# Patient Record
Sex: Female | Born: 1945 | Race: White | Hispanic: No | Marital: Married | Smoking: Never smoker
Health system: Southern US, Community
[De-identification: ages and names within clinical notes are randomized; demographics above are authoritative.]

## PROBLEM LIST (undated history)

## (undated) ENCOUNTER — Emergency Department (HOSPITAL_COMMUNITY): Payer: Medicare HMO

## (undated) DIAGNOSIS — H269 Unspecified cataract: Secondary | ICD-10-CM

## (undated) DIAGNOSIS — F32A Depression, unspecified: Secondary | ICD-10-CM

## (undated) DIAGNOSIS — IMO0001 Reserved for inherently not codable concepts without codable children: Secondary | ICD-10-CM

## (undated) DIAGNOSIS — M419 Scoliosis, unspecified: Secondary | ICD-10-CM

## (undated) DIAGNOSIS — J45909 Unspecified asthma, uncomplicated: Secondary | ICD-10-CM

## (undated) DIAGNOSIS — I509 Heart failure, unspecified: Secondary | ICD-10-CM

## (undated) DIAGNOSIS — I5032 Chronic diastolic (congestive) heart failure: Secondary | ICD-10-CM

## (undated) DIAGNOSIS — I700 Atherosclerosis of aorta: Secondary | ICD-10-CM

## (undated) DIAGNOSIS — D689 Coagulation defect, unspecified: Secondary | ICD-10-CM

## (undated) DIAGNOSIS — N189 Chronic kidney disease, unspecified: Secondary | ICD-10-CM

## (undated) DIAGNOSIS — E7800 Pure hypercholesterolemia, unspecified: Secondary | ICD-10-CM

## (undated) DIAGNOSIS — I4891 Unspecified atrial fibrillation: Secondary | ICD-10-CM

## (undated) DIAGNOSIS — I2781 Cor pulmonale (chronic): Secondary | ICD-10-CM

## (undated) DIAGNOSIS — M1990 Unspecified osteoarthritis, unspecified site: Secondary | ICD-10-CM

## (undated) DIAGNOSIS — T7840XA Allergy, unspecified, initial encounter: Secondary | ICD-10-CM

## (undated) DIAGNOSIS — I4821 Permanent atrial fibrillation: Secondary | ICD-10-CM

## (undated) DIAGNOSIS — M4800 Spinal stenosis, site unspecified: Secondary | ICD-10-CM

## (undated) DIAGNOSIS — J42 Unspecified chronic bronchitis: Secondary | ICD-10-CM

## (undated) DIAGNOSIS — G709 Myoneural disorder, unspecified: Secondary | ICD-10-CM

## (undated) DIAGNOSIS — E079 Disorder of thyroid, unspecified: Secondary | ICD-10-CM

## (undated) DIAGNOSIS — Z8719 Personal history of other diseases of the digestive system: Secondary | ICD-10-CM

## (undated) DIAGNOSIS — I4819 Other persistent atrial fibrillation: Secondary | ICD-10-CM

## (undated) DIAGNOSIS — F329 Major depressive disorder, single episode, unspecified: Secondary | ICD-10-CM

## (undated) DIAGNOSIS — I10 Essential (primary) hypertension: Secondary | ICD-10-CM

## (undated) DIAGNOSIS — E119 Type 2 diabetes mellitus without complications: Secondary | ICD-10-CM

## (undated) DIAGNOSIS — K219 Gastro-esophageal reflux disease without esophagitis: Secondary | ICD-10-CM

## (undated) DIAGNOSIS — M792 Neuralgia and neuritis, unspecified: Secondary | ICD-10-CM

## (undated) DIAGNOSIS — N184 Chronic kidney disease, stage 4 (severe): Secondary | ICD-10-CM

## (undated) DIAGNOSIS — G4730 Sleep apnea, unspecified: Secondary | ICD-10-CM

## (undated) DIAGNOSIS — I499 Cardiac arrhythmia, unspecified: Secondary | ICD-10-CM

## (undated) DIAGNOSIS — F419 Anxiety disorder, unspecified: Secondary | ICD-10-CM

## (undated) HISTORY — PX: CARPAL TUNNEL RELEASE: SHX101

## (undated) HISTORY — PX: CATARACT EXTRACTION W/ INTRAOCULAR LENS  IMPLANT, BILATERAL: SHX1307

## (undated) HISTORY — DX: Chronic kidney disease, unspecified: N18.9

## (undated) HISTORY — DX: Gastro-esophageal reflux disease without esophagitis: K21.9

## (undated) HISTORY — DX: Scoliosis, unspecified: M41.9

## (undated) HISTORY — DX: Other persistent atrial fibrillation: I48.19

## (undated) HISTORY — DX: Unspecified cataract: H26.9

## (undated) HISTORY — DX: Anxiety disorder, unspecified: F41.9

## (undated) HISTORY — DX: Morbid (severe) obesity due to excess calories: E66.01

## (undated) HISTORY — DX: Unspecified atrial fibrillation: I48.91

## (undated) HISTORY — DX: Myoneural disorder, unspecified: G70.9

## (undated) HISTORY — PX: TUBAL LIGATION: SHX77

## (undated) HISTORY — PX: FINGER FRACTURE SURGERY: SHX638

## (undated) HISTORY — PX: BLADDER SUSPENSION: SHX72

## (undated) HISTORY — PX: FRACTURE SURGERY: SHX138

## (undated) HISTORY — DX: Reserved for inherently not codable concepts without codable children: IMO0001

## (undated) HISTORY — DX: Atherosclerosis of aorta: I70.0

## (undated) HISTORY — DX: Spinal stenosis, site unspecified: M48.00

## (undated) HISTORY — DX: Heart failure, unspecified: I50.9

## (undated) HISTORY — PX: LAPAROSCOPIC CHOLECYSTECTOMY: SUR755

## (undated) HISTORY — DX: Essential (primary) hypertension: I10

## (undated) HISTORY — DX: Pure hypercholesterolemia, unspecified: E78.00

## (undated) HISTORY — DX: Coagulation defect, unspecified: D68.9

## (undated) HISTORY — DX: Disorder of thyroid, unspecified: E07.9

## (undated) HISTORY — DX: Neuralgia and neuritis, unspecified: M79.2

## (undated) HISTORY — DX: Allergy, unspecified, initial encounter: T78.40XA

## (undated) HISTORY — DX: Depression, unspecified: F32.A

---
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
## 1978-03-23 HISTORY — PX: VAGINAL HYSTERECTOMY: SUR661

## 1997-08-25 ENCOUNTER — Emergency Department (HOSPITAL_COMMUNITY): Admission: EM | Admit: 1997-08-25 | Discharge: 1997-08-25 | Payer: Self-pay

## 1997-09-11 ENCOUNTER — Encounter: Admission: RE | Admit: 1997-09-11 | Discharge: 1997-12-10 | Payer: Self-pay | Admitting: Internal Medicine

## 1999-10-24 ENCOUNTER — Encounter: Payer: Self-pay | Admitting: Internal Medicine

## 1999-10-24 ENCOUNTER — Encounter: Admission: RE | Admit: 1999-10-24 | Discharge: 1999-10-24 | Payer: Self-pay | Admitting: Internal Medicine

## 2001-01-03 ENCOUNTER — Encounter: Admission: RE | Admit: 2001-01-03 | Discharge: 2001-01-03 | Payer: Self-pay | Admitting: Internal Medicine

## 2001-01-03 ENCOUNTER — Encounter: Payer: Self-pay | Admitting: Internal Medicine

## 2001-06-27 ENCOUNTER — Encounter: Payer: Self-pay | Admitting: General Surgery

## 2001-07-04 ENCOUNTER — Encounter: Payer: Self-pay | Admitting: General Surgery

## 2001-07-04 ENCOUNTER — Encounter (INDEPENDENT_AMBULATORY_CARE_PROVIDER_SITE_OTHER): Payer: Self-pay | Admitting: Specialist

## 2001-07-04 ENCOUNTER — Observation Stay (HOSPITAL_COMMUNITY): Admission: RE | Admit: 2001-07-04 | Discharge: 2001-07-05 | Payer: Self-pay | Admitting: General Surgery

## 2001-11-02 ENCOUNTER — Encounter: Payer: Self-pay | Admitting: Internal Medicine

## 2001-11-02 ENCOUNTER — Encounter: Admission: RE | Admit: 2001-11-02 | Discharge: 2001-11-02 | Payer: Self-pay | Admitting: Internal Medicine

## 2003-02-14 ENCOUNTER — Encounter: Admission: RE | Admit: 2003-02-14 | Discharge: 2003-02-14 | Payer: Self-pay | Admitting: Internal Medicine

## 2004-03-13 ENCOUNTER — Encounter: Admission: RE | Admit: 2004-03-13 | Discharge: 2004-03-13 | Payer: Self-pay | Admitting: Internal Medicine

## 2004-06-20 ENCOUNTER — Encounter: Admission: RE | Admit: 2004-06-20 | Discharge: 2004-06-20 | Payer: Self-pay | Admitting: Internal Medicine

## 2004-09-02 ENCOUNTER — Encounter: Admission: RE | Admit: 2004-09-02 | Discharge: 2004-09-02 | Payer: Self-pay | Admitting: Occupational Medicine

## 2004-10-05 ENCOUNTER — Ambulatory Visit (HOSPITAL_COMMUNITY)
Admission: RE | Admit: 2004-10-05 | Discharge: 2004-10-05 | Payer: Self-pay | Admitting: Physical Medicine and Rehabilitation

## 2004-10-20 ENCOUNTER — Encounter: Admission: RE | Admit: 2004-10-20 | Discharge: 2004-10-20 | Payer: Self-pay | Admitting: Internal Medicine

## 2004-12-15 DIAGNOSIS — W540XXA Bitten by dog, initial encounter: Secondary | ICD-10-CM | POA: Insufficient documentation

## 2004-12-15 DIAGNOSIS — S91309A Unspecified open wound, unspecified foot, initial encounter: Secondary | ICD-10-CM | POA: Insufficient documentation

## 2005-03-31 ENCOUNTER — Encounter: Admission: RE | Admit: 2005-03-31 | Discharge: 2005-03-31 | Payer: Self-pay | Admitting: Internal Medicine

## 2005-08-27 ENCOUNTER — Ambulatory Visit: Payer: Self-pay | Admitting: Orthopedic Surgery

## 2006-06-11 ENCOUNTER — Encounter: Admission: RE | Admit: 2006-06-11 | Discharge: 2006-06-11 | Payer: Self-pay | Admitting: Internal Medicine

## 2006-06-21 ENCOUNTER — Emergency Department (HOSPITAL_COMMUNITY): Admission: EM | Admit: 2006-06-21 | Discharge: 2006-06-22 | Payer: Self-pay | Admitting: Emergency Medicine

## 2006-07-01 ENCOUNTER — Ambulatory Visit: Payer: Self-pay | Admitting: *Deleted

## 2006-12-14 ENCOUNTER — Encounter: Admission: RE | Admit: 2006-12-14 | Discharge: 2006-12-14 | Payer: Self-pay | Admitting: Internal Medicine

## 2007-07-25 ENCOUNTER — Encounter: Admission: RE | Admit: 2007-07-25 | Discharge: 2007-07-25 | Payer: Self-pay | Admitting: Internal Medicine

## 2008-06-26 ENCOUNTER — Encounter (INDEPENDENT_AMBULATORY_CARE_PROVIDER_SITE_OTHER): Payer: Self-pay | Admitting: *Deleted

## 2008-07-25 ENCOUNTER — Encounter: Admission: RE | Admit: 2008-07-25 | Discharge: 2008-07-25 | Payer: Self-pay | Admitting: Internal Medicine

## 2008-07-25 ENCOUNTER — Ambulatory Visit: Payer: Self-pay | Admitting: Gastroenterology

## 2008-08-10 ENCOUNTER — Telehealth: Payer: Self-pay | Admitting: Gastroenterology

## 2008-08-13 ENCOUNTER — Ambulatory Visit: Payer: Self-pay | Admitting: Gastroenterology

## 2008-08-14 ENCOUNTER — Telehealth: Payer: Self-pay | Admitting: Gastroenterology

## 2008-08-14 HISTORY — PX: COLONOSCOPY: SHX174

## 2008-11-29 ENCOUNTER — Encounter: Admission: RE | Admit: 2008-11-29 | Discharge: 2008-12-20 | Payer: Self-pay | Admitting: Orthopedic Surgery

## 2008-12-21 ENCOUNTER — Encounter: Admission: RE | Admit: 2008-12-21 | Discharge: 2009-03-21 | Payer: Self-pay | Admitting: Orthopedic Surgery

## 2009-04-29 ENCOUNTER — Ambulatory Visit (HOSPITAL_COMMUNITY): Admission: RE | Admit: 2009-04-29 | Discharge: 2009-04-29 | Payer: Self-pay | Admitting: Internal Medicine

## 2009-05-01 ENCOUNTER — Emergency Department (HOSPITAL_COMMUNITY): Admission: EM | Admit: 2009-05-01 | Discharge: 2009-05-01 | Payer: Self-pay | Admitting: Emergency Medicine

## 2009-08-30 ENCOUNTER — Encounter: Payer: Self-pay | Admitting: Internal Medicine

## 2009-10-21 ENCOUNTER — Encounter: Admission: RE | Admit: 2009-10-21 | Discharge: 2009-10-21 | Payer: Self-pay | Admitting: Internal Medicine

## 2009-10-30 ENCOUNTER — Ambulatory Visit: Payer: Self-pay | Admitting: Cardiovascular Disease

## 2009-11-06 ENCOUNTER — Telehealth (INDEPENDENT_AMBULATORY_CARE_PROVIDER_SITE_OTHER): Payer: Self-pay | Admitting: *Deleted

## 2009-11-07 ENCOUNTER — Encounter (HOSPITAL_COMMUNITY): Admission: RE | Admit: 2009-11-07 | Discharge: 2009-12-18 | Payer: Self-pay | Admitting: Cardiovascular Disease

## 2009-11-07 ENCOUNTER — Ambulatory Visit: Payer: Self-pay | Admitting: Internal Medicine

## 2009-11-07 ENCOUNTER — Encounter: Payer: Self-pay | Admitting: Internal Medicine

## 2009-11-07 ENCOUNTER — Ambulatory Visit: Payer: Self-pay

## 2009-11-13 ENCOUNTER — Ambulatory Visit: Payer: Self-pay | Admitting: Cardiovascular Disease

## 2009-11-15 ENCOUNTER — Inpatient Hospital Stay (HOSPITAL_BASED_OUTPATIENT_CLINIC_OR_DEPARTMENT_OTHER): Admission: RE | Admit: 2009-11-15 | Discharge: 2009-11-15 | Payer: Self-pay | Admitting: Cardiovascular Disease

## 2009-11-15 ENCOUNTER — Ambulatory Visit: Payer: Self-pay | Admitting: Cardiovascular Disease

## 2009-11-16 HISTORY — PX: CARDIAC CATHETERIZATION: SHX172

## 2009-11-19 DIAGNOSIS — R0609 Other forms of dyspnea: Secondary | ICD-10-CM | POA: Insufficient documentation

## 2009-11-19 DIAGNOSIS — I119 Hypertensive heart disease without heart failure: Secondary | ICD-10-CM | POA: Insufficient documentation

## 2009-11-19 DIAGNOSIS — R0600 Dyspnea, unspecified: Secondary | ICD-10-CM | POA: Insufficient documentation

## 2009-11-19 DIAGNOSIS — E119 Type 2 diabetes mellitus without complications: Secondary | ICD-10-CM | POA: Insufficient documentation

## 2009-11-19 DIAGNOSIS — R0989 Other specified symptoms and signs involving the circulatory and respiratory systems: Secondary | ICD-10-CM

## 2009-11-19 DIAGNOSIS — R9431 Abnormal electrocardiogram [ECG] [EKG]: Secondary | ICD-10-CM | POA: Insufficient documentation

## 2009-11-29 ENCOUNTER — Ambulatory Visit: Payer: Self-pay | Admitting: Cardiology

## 2009-12-02 ENCOUNTER — Ambulatory Visit: Payer: Self-pay | Admitting: Internal Medicine

## 2009-12-03 ENCOUNTER — Encounter: Payer: Self-pay | Admitting: Internal Medicine

## 2009-12-04 ENCOUNTER — Ambulatory Visit: Payer: Self-pay | Admitting: Cardiovascular Disease

## 2009-12-05 ENCOUNTER — Ambulatory Visit: Payer: Self-pay | Admitting: Internal Medicine

## 2009-12-06 ENCOUNTER — Ambulatory Visit: Payer: Self-pay | Admitting: Cardiovascular Disease

## 2009-12-09 ENCOUNTER — Ambulatory Visit: Payer: Self-pay | Admitting: Cardiovascular Disease

## 2009-12-09 ENCOUNTER — Ambulatory Visit (HOSPITAL_COMMUNITY): Admission: RE | Admit: 2009-12-09 | Discharge: 2009-12-09 | Payer: Self-pay | Admitting: Cardiovascular Disease

## 2010-01-13 ENCOUNTER — Ambulatory Visit: Payer: Self-pay | Admitting: Internal Medicine

## 2010-02-03 ENCOUNTER — Ambulatory Visit: Payer: Self-pay | Admitting: Cardiovascular Disease

## 2010-02-07 ENCOUNTER — Ambulatory Visit: Payer: Self-pay | Admitting: Cardiovascular Disease

## 2010-02-07 ENCOUNTER — Ambulatory Visit (HOSPITAL_COMMUNITY)
Admission: RE | Admit: 2010-02-07 | Discharge: 2010-02-07 | Payer: Self-pay | Source: Home / Self Care | Admitting: Cardiovascular Disease

## 2010-02-19 ENCOUNTER — Ambulatory Visit: Payer: Self-pay | Admitting: Cardiovascular Disease

## 2010-03-26 ENCOUNTER — Ambulatory Visit: Payer: Self-pay | Admitting: Cardiovascular Disease

## 2010-04-12 ENCOUNTER — Inpatient Hospital Stay (HOSPITAL_COMMUNITY)
Admission: EM | Admit: 2010-04-12 | Discharge: 2010-04-21 | Payer: Self-pay | Source: Home / Self Care | Attending: Internal Medicine | Admitting: Internal Medicine

## 2010-04-15 LAB — COMPREHENSIVE METABOLIC PANEL
AST: 37 U/L (ref 0–37)
CO2: 25 mEq/L (ref 19–32)
GFR calc Af Amer: >60 mL/min (ref >60)
GFR calc non Af Amer: >60 mL/min (ref >60)
Glucose, Bld: 303 mg/dL — ABNORMAL HIGH (ref 70–99)
Potassium: 3.8 mEq/L (ref 3.5–5.1)
Sodium: 131 mEq/L — ABNORMAL LOW (ref 135–145)
Total Bilirubin: 1.2 mg/dL (ref 0.3–1.2)
Total Protein: 6.5 g/dL (ref 6.0–8.3)

## 2010-04-15 LAB — INFLUENZA PANEL BY PCR (TYPE A & B)
Influenza A By PCR: NEGATIVE
Influenza B By PCR: NEGATIVE

## 2010-04-15 LAB — BASIC METABOLIC PANEL
BUN: 8 mg/dL (ref 6–23)
Calcium: 7.5 mg/dL — ABNORMAL LOW (ref 8.4–10.5)
Chloride: 99 mEq/L (ref 96–112)
Creatinine, Ser: 0.69 mg/dL (ref 0.4–1.2)
GFR calc Af Amer: >60 mL/min (ref >60)

## 2010-04-15 LAB — GLUCOSE, CAPILLARY
Glucose-Capillary: 260 mg/dL — ABNORMAL HIGH (ref 70–99)
Glucose-Capillary: 295 mg/dL — ABNORMAL HIGH (ref 70–99)
Glucose-Capillary: 332 mg/dL — ABNORMAL HIGH (ref 70–99)
Glucose-Capillary: 188 mg/dL — ABNORMAL HIGH (ref 70–99)
Glucose-Capillary: 191 mg/dL — ABNORMAL HIGH (ref 70–99)
Glucose-Capillary: 148 mg/dL — ABNORMAL HIGH (ref 70–99)
Glucose-Capillary: 232 mg/dL — ABNORMAL HIGH (ref 70–99)

## 2010-04-15 LAB — CBC
HCT: 37.9 % (ref 36.0–46.0)
HCT: 31.9 % — ABNORMAL LOW (ref 36.0–46.0)
Hemoglobin: 12.2 g/dL (ref 12.0–15.0)
Hemoglobin: 10.2 g/dL — ABNORMAL LOW (ref 12.0–15.0)
MCH: 26.2 pg (ref 26.0–34.0)
MCH: 26.4 pg (ref 26.0–34.0)
MCHC: 32.2 g/dL (ref 30.0–36.0)
MCHC: 32.4 g/dL (ref 30.0–36.0)
MCV: 81.5 fL (ref 78.0–100.0)
MCV: 81.4 fL (ref 78.0–100.0)
MCV: 82.4 fL (ref 78.0–100.0)
Platelets: 180 10*3/uL (ref 150–400)
Platelets: 162 10*3/uL (ref 150–400)
RBC: 4.09 MIL/uL (ref 3.87–5.11)
RDW: 15.1 % (ref 11.5–15.5)
RDW: 15.3 % (ref 11.5–15.5)
WBC: 6.4 10*3/uL (ref 4.0–10.5)

## 2010-04-15 LAB — DIFFERENTIAL
Eosinophils Relative: 0 % (ref 0–5)
Lymphocytes Relative: 9 % — ABNORMAL LOW (ref 12–46)

## 2010-04-15 LAB — URINALYSIS, ROUTINE W REFLEX MICROSCOPIC
Protein, ur: 30 mg/dL — AB
Specific Gravity, Urine: 1.026 (ref 1.005–1.030)
Urine Glucose, Fasting: >1000 mg/dL — AB
Urobilinogen, UA: 1 mg/dL (ref 0.0–1.0)

## 2010-04-15 LAB — URINE MICROSCOPIC-ADD ON

## 2010-04-15 LAB — PROCALCITONIN: Procalcitonin: 0.94 ng/mL

## 2010-04-16 LAB — URINALYSIS, MICROSCOPIC ONLY
Bilirubin Urine: NEGATIVE
Hgb urine dipstick: NEGATIVE
Specific Gravity, Urine: 1.028 (ref 1.005–1.030)
Urine Glucose, Fasting: NEGATIVE mg/dL
Urobilinogen, UA: 0.2 mg/dL (ref 0.0–1.0)
pH: 5 (ref 5.0–8.0)

## 2010-04-16 LAB — CBC
HCT: 34.9 % — ABNORMAL LOW (ref 36.0–46.0)
HCT: 34.3 % — ABNORMAL LOW (ref 36.0–46.0)
Hemoglobin: 10.8 g/dL — ABNORMAL LOW (ref 12.0–15.0)
Platelets: 195 10*3/uL (ref 150–400)
RBC: 4.18 MIL/uL (ref 3.87–5.11)
RDW: 15.6 % — ABNORMAL HIGH (ref 11.5–15.5)
RDW: 15.7 % — ABNORMAL HIGH (ref 11.5–15.5)
WBC: 7.3 10*3/uL (ref 4.0–10.5)
WBC: 8.1 10*3/uL (ref 4.0–10.5)

## 2010-04-16 LAB — GLUCOSE, CAPILLARY
Glucose-Capillary: 171 mg/dL — ABNORMAL HIGH (ref 70–99)
Glucose-Capillary: 144 mg/dL — ABNORMAL HIGH (ref 70–99)
Glucose-Capillary: 197 mg/dL — ABNORMAL HIGH (ref 70–99)
Glucose-Capillary: 190 mg/dL — ABNORMAL HIGH (ref 70–99)

## 2010-04-16 LAB — COMPREHENSIVE METABOLIC PANEL
ALT: 58 U/L — ABNORMAL HIGH (ref 0–35)
ALT: 61 U/L — ABNORMAL HIGH (ref 0–35)
Alkaline Phosphatase: 82 U/L (ref 39–117)
Alkaline Phosphatase: 97 U/L (ref 39–117)
BUN: 7 mg/dL (ref 6–23)
CO2: 25 mEq/L (ref 19–32)
CO2: 25 mEq/L (ref 19–32)
Chloride: 108 mEq/L (ref 96–112)
GFR calc non Af Amer: >60 mL/min (ref >60)
Glucose, Bld: 181 mg/dL — ABNORMAL HIGH (ref 70–99)
Glucose, Bld: 147 mg/dL — ABNORMAL HIGH (ref 70–99)
Potassium: 4.4 mEq/L (ref 3.5–5.1)
Potassium: 4.6 mEq/L (ref 3.5–5.1)
Sodium: 138 mEq/L (ref 135–145)
Sodium: 138 mEq/L (ref 135–145)
Total Bilirubin: 0.7 mg/dL (ref 0.3–1.2)
Total Protein: 5.5 g/dL — ABNORMAL LOW (ref 6.0–8.3)
Total Protein: 5.7 g/dL — ABNORMAL LOW (ref 6.0–8.3)

## 2010-04-16 LAB — URINE CULTURE
Colony Count: >=100000
Culture  Setup Time: 201201220020

## 2010-04-16 LAB — LACTIC ACID, PLASMA: Lactic Acid, Venous: 1.7 mmol/L (ref 0.5–2.2)

## 2010-04-17 LAB — COMPREHENSIVE METABOLIC PANEL
ALT: 64 U/L — ABNORMAL HIGH (ref 0–35)
AST: 58 U/L — ABNORMAL HIGH (ref 0–37)
CO2: 22 mEq/L (ref 19–32)
Chloride: 109 mEq/L (ref 96–112)
Creatinine, Ser: 0.59 mg/dL (ref 0.4–1.2)
GFR calc Af Amer: >60 mL/min (ref >60)
GFR calc non Af Amer: >60 mL/min (ref >60)
Glucose, Bld: 144 mg/dL — ABNORMAL HIGH (ref 70–99)
Total Bilirubin: 0.6 mg/dL (ref 0.3–1.2)

## 2010-04-17 LAB — CBC
HCT: 33.1 % — ABNORMAL LOW (ref 36.0–46.0)
Hemoglobin: 10.3 g/dL — ABNORMAL LOW (ref 12.0–15.0)
MCH: 25.7 pg — ABNORMAL LOW (ref 26.0–34.0)
RBC: 4.01 MIL/uL (ref 3.87–5.11)

## 2010-04-17 LAB — GLUCOSE, CAPILLARY: Glucose-Capillary: 247 mg/dL — ABNORMAL HIGH (ref 70–99)

## 2010-04-17 NOTE — Consult Note (Addendum)
NAMETAHJAI, BROCKHAUS              ACCOUNT NO.:  [id number]  MEDICAL RECORD NO.:  [id number]          PATIENT TYPE:  INP  LOCATION:  3736                         FACILITY:  [facility]  PHYSICIAN:  Ludwig Lean. Doreatha Lew, M.D.DATE OF BIRTH:  July 14, 1945  DATE OF CONSULTATION: DATE OF DISCHARGE:                                CONSULTATION   Thank you very much for asking me to see Mr. Buys for management of her rapid ventricular response to her atrial fibrillation.  She presented on April 12, 2010, with a 3-4-day history of increasing weakness, cough, and was found to have a right lower lobe pneumonia. She had been started on Z-Pak earlier and has been on antibiotics since that time.  She has a history of chronic atrial fibrillation, but during the treatment of her hospitalization, the patient received albuterol bronchodilators and developed a rapid ventricular response with increasing anxiety.  She remained hypertensive during this time.  She has a history of normal coronary arteries by catheterization in August 2011.  She has a history of diabetes as well as morbid obesity.  She has had a history of failed cardioversion over the past 6 months.  She has been on chronic metoprolol extended release 150 mg a day as well as chronic digoxin.  She received 60 mg of diltiazem p.o. earlier today.  Her past medical history is remarkable for: 1. Chronic mild dyspnea on exertion. 2. Chronic atrial fibrillation. 3. Exogenous obesity. 4. Dyslipidemia with lipid showed cholesterol levels of 183,     triglycerides 265, HDL cholesterol of 29, LDL cholesterol of 101 in     September 2011. 5. She has type 2 diabetes mellitus. 6. Interstitial cystitis. 7. Lichen planus. 8. Hypertension. 9. An abnormal EKG. 10.A history of atypical chest pain with a catheterization in August     2011, showing normal coronary arteries and normal ejection     fraction. 11.She has a history of osteoarthritis and  degenerative joint disease     of the spine. 12.She has vitamin D deficiency.  Her chronic home medicines include: 1. Crestor 10 mg a day. 2. Lasix 40 mg a day. 3. Ambien 10 mg nightly. 4. Toprol-XL 150 mg per day. 5. Pradaxa 150 mg p.o. b.i.d. 6. Digoxin 0.25 mg daily. 7. Possible Celexa 20 mg a day. 8. Actoplus Met twice a day. 9. Reclast annually.  ALLERGIES:  PENICILLIN has been associated with flushing and LIDOCAINE has been associated with palpitations.  Previous surgical history includes: 1. Total abdominal hysterectomy. 2. Laparoscopic cholecystectomy. 3. Bladder suspension. 4. Mallet finger operation in May 2010. 5. She has had previous colonoscopy.  SOCIAL HISTORY:  She is married and has 4 children.  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  There is no history of tobacco or alcohol abuse.  FAMILY HISTORY:  Father died at age 88 of stroke.  Mother died at age 57 of heart failure.  One of her siblings has premature cardiovascular disease.  REVIEW OF SYSTEM:  As noted in the history of present illness.  She has had some nausea, vomiting, and diarrhea leading to this illness.  PHYSICAL EXAMINATION:  GENERAL:  She is pleasant, but mildly diaphoretic. VITAL SIGNS:  Blood pressure 150/90, heart rates in the 120-150 with irregularly irregular rhythm. LUNGS:  Right base for rales. HEART:  Irregularly regular rhythm without murmur. ABDOMEN:  Obese. EXTREMITIES:  Trace edema. NEUROLOGIC:  She is intact.  OVERALL IMPRESSION: 1. Chronic atrial fibrillation now, with rapid ventricular response     secondary to bronchodilators. 2. Pneumonia. 3. Diabetes mellitus. 4. Exogenous obesity.  PLAN:  We will start her on Cardizem drip.  We will continue beta- blockers and digoxin.  I would like to change her Pradaxa to Lovenox while in the hospital and we can switch back once we reach discharge. She will be on telemetry at this point in  time.  Pertinent lab tests show urinalysis to be essentially unremarkable. CMET is remarkable for BUN of 7, creatinine of 0.6, and normal electrolytes on April 16, 2010.  CBC is remarkable for white count of 8100, hematocrit of 34, platelet count of 195,000.  Lactic acid is 1.7 which is the normal range.     Ludwig Lean. Doreatha Lew, M.D.     SNT/MEDQ  D:  04/16/2010  T:  04/17/2010  Job:  [id number]  Electronically Signed by Romeo Apple M.D. on 04/17/2010 03:39:58 PM

## 2010-04-18 LAB — BRAIN NATRIURETIC PEPTIDE: Pro B Natriuretic peptide (BNP): 429 pg/mL — ABNORMAL HIGH (ref 0.0–100.0)

## 2010-04-18 LAB — GLUCOSE, CAPILLARY: Glucose-Capillary: 136 mg/dL — ABNORMAL HIGH (ref 70–99)

## 2010-04-19 LAB — D-DIMER, QUANTITATIVE: D-Dimer, Quant: 0.68 ug/mL-FEU — ABNORMAL HIGH (ref 0.00–0.48)

## 2010-04-19 LAB — GLUCOSE, CAPILLARY
Glucose-Capillary: 146 mg/dL — ABNORMAL HIGH (ref 70–99)
Glucose-Capillary: 234 mg/dL — ABNORMAL HIGH (ref 70–99)
Glucose-Capillary: 174 mg/dL — ABNORMAL HIGH (ref 70–99)

## 2010-04-19 LAB — CULTURE, BLOOD (ROUTINE X 2)
Culture  Setup Time: 201201220008
Culture: NO GROWTH

## 2010-04-19 LAB — BASIC METABOLIC PANEL
Calcium: 8.8 mg/dL (ref 8.4–10.5)
GFR calc Af Amer: >60 mL/min (ref >60)
GFR calc non Af Amer: >60 mL/min (ref >60)
Sodium: 140 mEq/L (ref 135–145)

## 2010-04-19 LAB — HEPATIC FUNCTION PANEL
AST: 33 U/L (ref 0–37)
Albumin: 2.7 g/dL — ABNORMAL LOW (ref 3.5–5.2)
Total Protein: 6 g/dL (ref 6.0–8.3)

## 2010-04-20 LAB — CBC
Hemoglobin: 11 g/dL — ABNORMAL LOW (ref 12.0–15.0)
MCH: 26.1 pg (ref 26.0–34.0)
MCHC: 31.7 g/dL (ref 30.0–36.0)
MCV: 82.2 fL (ref 78.0–100.0)
RBC: 4.22 MIL/uL (ref 3.87–5.11)

## 2010-04-20 LAB — COMPREHENSIVE METABOLIC PANEL
BUN: 4 mg/dL — ABNORMAL LOW (ref 6–23)
CO2: 31 mEq/L (ref 19–32)
Calcium: 9.1 mg/dL (ref 8.4–10.5)
Chloride: 98 mEq/L (ref 96–112)
Creatinine, Ser: 0.63 mg/dL (ref 0.4–1.2)
GFR calc Af Amer: >60 mL/min (ref >60)
GFR calc non Af Amer: >60 mL/min (ref >60)
Total Bilirubin: 0.7 mg/dL (ref 0.3–1.2)

## 2010-04-20 LAB — DIFFERENTIAL
Basophils Relative: 1 % (ref 0–1)
Eosinophils Absolute: 0.4 10*3/uL (ref 0.0–0.7)
Lymphs Abs: 3.3 10*3/uL (ref 0.7–4.0)
Monocytes Absolute: 0.7 10*3/uL (ref 0.1–1.0)
Monocytes Relative: 7 % (ref 3–12)
Neutro Abs: 5.7 10*3/uL (ref 1.7–7.7)
Neutrophils Relative %: 56 % (ref 43–77)

## 2010-04-20 LAB — GLUCOSE, CAPILLARY
Glucose-Capillary: 202 mg/dL — ABNORMAL HIGH (ref 70–99)
Glucose-Capillary: 156 mg/dL — ABNORMAL HIGH (ref 70–99)
Glucose-Capillary: 202 mg/dL — ABNORMAL HIGH (ref 70–99)

## 2010-04-21 LAB — COMPREHENSIVE METABOLIC PANEL
ALT: 32 U/L (ref 0–35)
Albumin: 2.8 g/dL — ABNORMAL LOW (ref 3.5–5.2)
Calcium: 8.8 mg/dL (ref 8.4–10.5)
Glucose, Bld: 143 mg/dL — ABNORMAL HIGH (ref 70–99)
Sodium: 141 mEq/L (ref 135–145)
Total Protein: 5.8 g/dL — ABNORMAL LOW (ref 6.0–8.3)

## 2010-04-21 LAB — CBC
MCV: 82 fL (ref 78.0–100.0)
Platelets: 324 10*3/uL (ref 150–400)
RBC: 4.22 MIL/uL (ref 3.87–5.11)
RDW: 16.3 % — ABNORMAL HIGH (ref 11.5–15.5)
WBC: 9.1 10*3/uL (ref 4.0–10.5)

## 2010-04-21 LAB — GLUCOSE, CAPILLARY: Glucose-Capillary: 177 mg/dL — ABNORMAL HIGH (ref 70–99)

## 2010-04-21 LAB — DIFFERENTIAL
Basophils Absolute: 0.1 10*3/uL (ref 0.0–0.1)
Basophils Relative: 1 % (ref 0–1)
Eosinophils Absolute: 0.4 10*3/uL (ref 0.0–0.7)
Eosinophils Relative: 4 % (ref 0–5)
Neutrophils Relative %: 52 % (ref 43–77)

## 2010-04-22 ENCOUNTER — Ambulatory Visit: Payer: Self-pay | Admitting: Cardiovascular Disease

## 2010-04-24 NOTE — Assessment & Plan Note (Signed)
Summary: 6 WKS F/U ///KP   Primary Provider/Referring Provider:  R. Avva  CC:  6 week follow up visit-review 31mw and PFT results..  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 She was aware of dyspnea in October/ November of 2010 while walking up an incline. In June 2011 she was treated for bronchits with azithromycin, improving slowly. She notes only dyspnea, mainly with climbing hills or stairs, or rushing. Only a little episodic coughing with little sputum or wheeze. Breathing doesn't wake he. Did wheeze some while supine in June.Rockey Situ office spirometry normal in August. Cardiac work-up with Dr Acie Fredrickson showed inverted T wave, abnormal myoview, but normal coronaries and EF on Cath. No hx MI. She has been having episodic palpitations, not related to episodes of dyspnea. On October 23, 2009 she had her "worst episode" of sustained tightness and dyspnea trying to climb an incline in very hot humid weather. She was told most likely dyspnea reflected her overweight and deconditioning.  Denies prior dx of asthma. Allergy testing remotely, no vaccine. Eyes have itched over past year- told she had dry eyes.   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 She was referred last visit for dyspnea evaluation and was shown to have PAF, Since then she has done much better. Dr Acie Fredrickson added Pradaxa, Digoxin and Toprol XL. They stopped aspirin and benazepril. With heart rate control her dyspnea has been better,  She has had pneumonia x 4. Pneumovax x 2. Discussed weight loss and aerobic exercise.  CXRs were reviewed last time on film and returned to [place]. They had sugested mild interstitial fluid loading.  PFT-12/05/09- Mild restriction TLC 73%, DLCO 69%, Spirometry WNL w/ resp to BD 6MWT-12/05/09- 100%, 98%, 98% 368 m.  Preventive Screening-Counseling  & Management  Alcohol-Tobacco     Smoking Status: never     Passive Smoke Exposure: yes     Passive Smoke Counseling: to avoid passive smoke exposure  Current Medications (verified): 1)  Ambien 10 Mg Tabs (Zolpidem Tartrate) .... Take 1 By Mouth At Bedtime As Needed 2)  Furosemide 40 Mg Tabs (Furosemide) .... Take 1 By Mouth Once Daily 3)  Pradaxa 150 Mg Caps (Dabigatran Etexilate Mesylate) .... Take 1 By Mouth Once Daily 4)  Toprol Xl 50 Mg Xr24h-Tab (Metoprolol Succinate) .... Take 1 By Mouth Once Daily 5)  Digoxin 0.25 Mg Tabs (Digoxin) .... Take 1 By Mouth Once Daily 6)  Actoplus Met 15-500 Mg Tabs (Pioglitazone Hcl-Metformin Hcl) .... Take 1 By Mouth Once Daily  Allergies (verified): 1)  ! Pcn 2)  ! * Ivp Dye 3)  ! * Bee Stings  Past History:  Past Surgical History: Last updated: 12/02/2009 cholecystectomy,  hysterectomy  Family History: Last updated: 2009-12-07  Her father died at age 64 of a CVA.  Her mother died at   age 4 of congestive heart failure.      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  Never converted PPD skin test     Risk Factors: Smoking Status: never (01/13/2010) Passive Smoke Exposure: yes (01/13/2010)  Past Medical History:  1. Diabetes mellitus.   2. Hypertension.   3. Abnormal EKG.   4. Abnormal Myoview study. Nl cardiac cath  2011 Dr Acie Fredrickson  5. Dyspnea on exertion.  PFT 12/05/09- FEV1/FVC 0.81, mild resp to BD, TLC 73%, DLCO 69%, 6MWT 368 m.  6. Paroxysmal Atrial Fib 12/02/09  Review of Systems      See HPI       The patient complains of shortness of breath with activity and weight change.  The patient denies shortness of breath at rest, productive cough, non-productive cough, coughing up blood, chest pain, irregular heartbeats, acid heartburn, indigestion, loss of appetite,  abdominal pain, difficulty swallowing, sore throat, tooth/dental problems, headaches, nasal congestion/difficulty breathing through nose, sneezing, itching, ear ache, anxiety, rash, change in color of mucus, and fever.    Vital Signs:  Patient profile:   65 year old female Height:      68 inches Weight:      258.25 pounds BMI:     39.41 O2 Sat:      96 % on Room air Pulse rate:   95 / minute BP sitting:   140 / 90  (left arm) Cuff size:   large  Vitals Entered By: Clayborne Dana CMA (January 13, 2010 3:53 PM)  O2 Flow:  Room air CC: 6 week follow up visit-review 54mw and PFT results.   Physical Exam  Additional Exam:  General: A/Ox3; pleasant and cooperative, NAD, 132/72, p 113. room air sat 96%. SKIN: no rash, lesions NODES: no lymphadenopathy HEENT: [place]/AT, EOM- WNL, Conjuctivae- clear, PERRLA, TM-WNL, Nose- clear, Throat- clear and wnl, Mallampati  II NECK: Supple w/ fair ROM, JVD- none, normal carotid impulses w/o bruits Thyroid- normal to palpation CHEST: Clear to P&A, unlabored, no wheeze or cough HEART: Irregularly irregular pulse, no murmur or rub ABDOMEN: Soft and nl; nml bowel sounds; no organomegaly or masses noted, overweight [id number], nl pulses, no edema  NEURO: Grossly intact to observation      Impression & Recommendations:  Problem # 1:  DYSPNEA ON EXERTION (ICD-786.09)  Dyspnea primarily related to interstitial edema from Atrial Fib. Since then rate is controlled and she has lost wieght.  We discussed further weight loss and aerobic exercise. She still has some mild restriction and reduction of diffusion capacity with mild reactive airways disease on PFT. She did notice some response to  bronchodilator. We are going to take another look at her in a few months to see if there is significant lung disease separate from her cardiac status.   The following medications were removed from the medication list:    Benazepril Hcl 20 Mg Tabs (Benazepril hcl) .Marland Kitchen... Take  1 by mouth two times a day    Bystolic 10 Mg Tabs (Nebivolol hcl) .Marland Kitchen... Take 1 by mouth once daily Her updated medication list for this problem includes:    Furosemide 40 Mg Tabs (Furosemide) .Marland Kitchen... Take 1 by mouth once daily    Toprol Xl 50 Mg Xr24h-tab (Metoprolol succinate) .Marland Kitchen... Take 1 by mouth once daily  Medications Added to Medication List This Visit: 1)  Pradaxa 150 Mg Caps (Dabigatran etexilate mesylate) .... Take 1 by mouth once daily 2)  Toprol Xl 50 Mg Xr24h-tab (Metoprolol succinate) .... Take 1 by mouth once daily 3)  Digoxin 0.25 Mg Tabs (Digoxin) .... Take 1 by mouth once daily 4)  Actoplus Met 15-500 Mg Tabs (Pioglitazone hcl-metformin hcl) .... Take 1 by mouth once daily  Other Orders: Est. Patient Level IV [id number])  Patient Instructions: 1)  Please schedule a follow-up appointment in 4 months. 2)  Ok to use your rescue inhaler occasionally as  needed for instance before exercise if that helps.

## 2010-04-24 NOTE — Miscellaneous (Signed)
Summary: Orders Update pft charges  Clinical Lists Changes  Orders: Added new Service order of Carbon Monoxide diffusing w/capacity (94720) - Signed Added new Service order of Lung Volumes (94240) - Signed Added new Service order of Spirometry (Pre & Post) (94060) - Signed 

## 2010-04-24 NOTE — Assessment & Plan Note (Signed)
Summary: SIX MIN WALK- PULM STRESS TEST  Nurse Visit   Vital Signs:  Patient profile:   65 year old female Pulse rate:   84 / minute BP sitting:   138 / 80  Medications Prior to Update: 1)  Ambien 10 Mg Tabs (Zolpidem Tartrate) .... Take 1 By Mouth At Bedtime As Needed 2)  Furosemide 40 Mg Tabs (Furosemide) .... Take 1 By Mouth Once Daily 3)  Avandamet 06-998 Mg Tabs (Rosiglitazone-Metformin) .... Take 1 By Mouth Two Times A Day 4)  Benazepril Hcl 20 Mg Tabs (Benazepril Hcl) .... Take 1 By Mouth Two Times A Day 5)  Bystolic 10 Mg Tabs (Nebivolol Hcl) .... Take 1 By Mouth Once Daily 6)  Aspirin 325 Mg Tabs (Aspirin) .Marland Kitchen.. 1 Daily Otc  Allergies: 1)  ! Pcn 2)  ! * Ivp Dye 3)  ! * Bee Stings  Orders Added: 1)  Pulmonary Stress (6 min walk) [94620]   Six Minute Walk Test Medications taken before test(dose and time):  2)  Furosemide 40 Mg Tabs (Furosemide) .... Take 1 By Mouth Once Daily 3)  Avandamet 06-998 Mg Tabs (Rosiglitazone-Metformin) .... Take 1 By Mouth Two Times A Day 6)  Aspirin 325 Mg Tabs (Aspirin) .Marland Kitchen.. 1 Daily Otc 7) Toprol XL- 100mg  tabs a day 8) Prodaxa- 150mg  tabs- twice a day 9) Digoxin- 0.25mg - 1 tab a day Pt states that she stopped taking Bystolic as of yesterday per dr. Ernesta Amble instructions (new diagnosis AFIB). She began taking Toprol, Prodaxa and Digoxin   Supplemental oxygen during the test: No  Lap counter(place a tick mark inside a square for each lap completed) lap 1 complete  lap 2 complete   lap 3 complete   lap 4 complete  lap 5 complete  lap 6 complete  lap 7 complete    Baseline  BP sitting: 138/ 80 Heart rate: 84 Dyspnea ( Borg scale) 2 Fatigue (Borg scale) 2 SPO2 100  End Of Test  BP sitting: 144/ 82 Heart rate: 146 Dyspnea ( Borg scale) 3 Fatigue (Borg scale) 3 SPO2 98  2 Minutes post  BP sitting: 138/ 76 Heart rate: 107 SPO2 98  Stopped or paused before six minutes? No  Interpretation: Number of laps  7 X 48 meters =    336 meters+ final partial lap: 32 meters =    368 meters   Total distance walked in six minutes: 368 meters  Tech ID: Cooper Render, CNA (December 05, 2009 4:26 PM) Tech Comments Pt completed test w/ 0 rest breaks and 1 complaint: SOB which resolved at 2 mins post.   Appended Document: SIX MIN WALK- PULM STRESS TEST I GAVE NEW MED INFO TO KATIE.

## 2010-04-24 NOTE — Assessment & Plan Note (Signed)
Summary: SOB///kp   Primary Provider/Referring Provider:  R. [name]  CC:  Pulmonary consult-SOB(Dr. Doreatha [name]).  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 She was aware of dyspnea in October/ November of 2010 while walking up an incline. In June 2011 she was treated for bronchits with azithromycin, improving slowly. She notes only dyspnea, mainly with climbing hills or stairs, or rushing. Only a little episodic coughing with little sputum or wheeze. Breathing doesn't wake he. Did wheeze some while supine in June.Kristine [name] office spirometry normal in August. Cardiac work-up with Dr Kristine [name] showed inverted T wave, abnormal myoview, but normal coronaries and EF on Cath. No hx MI. She has been having episodic palpitations, not related to episodes of dyspnea. On October 23, 2009 she had her "worst episode" of sustained tightness and dyspnea trying to climb an incline in very hot humid weather. She was told most likely dyspnea reflected her overweight and deconditioning.  Denies prior dx of asthma. Allergy testing remotely, no vaccine. Eyes have itched over past year- told she had dry eyes.    Preventive Screening-Counseling & Management  Alcohol-Tobacco     Smoking Status: never     Passive Smoke Exposure: yes     Passive Smoke Counseling: to avoid passive smoke exposure  Comments: Hx passive smoke exposure from father while growing up.  Current Medications (verified): 1)  Ambien 10 Mg Tabs (Zolpidem Tartrate) .... Take 1 By Mouth At Bedtime As Needed 2)  Furosemide 40 Mg Tabs (Furosemide) .... Take 1 By Mouth Once Daily 3)  Avandamet 06-998 Mg Tabs (Rosiglitazone-Metformin) .... Take 1 By Mouth Two Times A Day 4)  Benazepril Hcl 20 Mg Tabs (Benazepril Hcl) .... Take 1 By Mouth Two Times A Day 5)  Bystolic 10 Mg Tabs (Nebivolol Hcl) .... Take 1 By Mouth Once Daily  Allergies (verified): 1)  ! Pcn 2)  ! * Ivp  Dye 3)  ! * Bee Stings  Past History:  Family History: Last updated: 2009/12/06  Her father died at age 8 of a CVA.  Her mother died at   age 41 of congestive heart failure.      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  Never converted PPD skin test     Risk Factors: Smoking Status: never (12/02/2009) Passive Smoke Exposure: yes (12/02/2009)  Past Medical History:  1. Diabetes mellitus.   2. Hypertension.   3. Abnormal EKG.   4. Abnormal Myoview study. Nl cardiac cath 2011 Dr Kristine [name]  5. Dyspnea on exertion.   6. 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  Never converted PPD skin test   Smoking Status:  never Passive Smoke Exposure:  yes  Review of Systems       The patient complains of shortness of breath with activity, irregular heartbeats, ear ache, and hand/feet swelling.  The patient denies shortness of breath at rest, productive cough, non-productive cough, coughing up blood, chest pain, acid heartburn, indigestion, loss of appetite, weight change, abdominal pain, difficulty swallowing, sore throat, tooth/dental problems, headaches, nasal congestion/difficulty breathing through nose, sneezing, itching, anxiety, depression, joint  stiffness or pain, rash, change in color of mucus, and fever.    Vital Signs:  Patient profile:   65 year old female Height:      68 inches Weight:      265.13 pounds O2 Sat:      96 % on Room air Pulse rate:   113 / minute BP sitting:   132 / 72  (left arm) Cuff size:   large  Vitals Entered By: Kristine [name] CMA (December 02, 2009 3:50 PM)  O2 Flow:   Room air CC: Pulmonary consult-SOB(Dr. Doreatha [name])   Physical Exam  Additional Exam:  General: A/Ox3; pleasant and cooperative, NAD, 132/72, p 113. room air sat 96%. SKIN: no rash, lesions NODES: no lymphadenopathy HEENT: [place]/AT, EOM- WNL, Conjuctivae- clear, PERRLA, TM-WNL, Nose- clear, Throat- clear and wnl, Mallampati  II NECK: Supple w/ fair ROM, JVD- none, normal carotid impulses w/o bruits Thyroid- normal to palpation CHEST: Clear to P&A, unlabored, no wheeze or cough HEART: Irregularly irregular pulse, no murmur or rub...........Marland KitchenEKG shows Atrial fib, VR 116/min. ABDOMEN: Soft and nl; nml bowel sounds; no organomegaly or masses noted, overweight [id number], nl pulses, no edema  NEURO: Grossly intact to observation      EKG  Procedure date:  12/02/2009  Findings:      Patient: Kristine [name] Note: All result statuses are Final unless otherwise noted.  Tests: (1) [facility] ECG Observations [id number])   Heart Rate                116 BPM   PR Interval               0 ms   QT Interval               328 ms   QTc Interval              426 ms   QRS Duration              94 ms   P Axis                    1 deg   EKG QRS axis              16 deg   T Axis                    45 deg   Interpretation            'Result Below...'       RESULT: Atrial fibrillation  Low voltage in precordial leads.   ABNORMAL  Note: An exclamation mark (!) indicates a result that was not dispersed into the flowsheet. Document Creation Date: 12/02/2009 4:32 PM _______________________________________________________________________  (1) Order result status: Fina  Impression & Recommendations:  Problem # 1:  DYSPNEA ON EXERTION (ICD-786.09)  Mostly relates to overweight and deconditioned. I would like to get a full PFT and 6MWT. Meanwhile, instead of the old albutrerol CFC inhaler she hasn't used, I will give her a sample Xopenex HFA that will be less stimulating if she  should decide to try it. Episodes of dyspnea relate to exertion, especially in heat, but have not been associated with the episodic palpitations.  Her body habitus would put her at increased future risk of DVT/VTE or OSA in the appropriate clinical situation. Her updated medication list for this problem includes:    Furosemide 40 Mg Tabs (Furosemide) .Marland Kitchen... Take 1 by mouth once daily  Benazepril Hcl 20 Mg Tabs (Benazepril hcl) .Marland Kitchen... Take 1 by mouth two times a day    Bystolic 10 Mg Tabs (Nebivolol hcl) .Marland Kitchen... Take 1 by mouth once daily  Problem # 2:  ATRIAL FIBRILLATION, PAROXYSMAL (ICD-427.31)  New diagnosis. Bystolic is slowing ventricular response some. I will have her take aspirin till she can see Dr Berneta Sages for scheduled appointment in 3 days. She is not in heart failure, denies chest pain/ angina. I don't think her dyspnea is from angina-equivalent dyspnea. Her updated medication list for this problem includes:    Bystolic 10 Mg Tabs (Nebivolol hcl) .Marland Kitchen... Take 1 by mouth once daily    Aspirin 325 Mg Tabs (Aspirin) .Marland Kitchen... 1 daily otc  Medications Added to Medication List This Visit: 1)  Ambien 10 Mg Tabs (Zolpidem tartrate) .... Take 1 by mouth at bedtime as needed 2)  Furosemide 40 Mg Tabs (Furosemide) .... Take 1 by mouth once daily 3)  Avandamet 06-998 Mg Tabs (Rosiglitazone-metformin) .... Take 1 by mouth two times a day 4)  Benazepril Hcl 20 Mg Tabs (Benazepril hcl) .... Take 1 by mouth two times a day 5)  Bystolic 10 Mg Tabs (Nebivolol hcl) .... Take 1 by mouth once daily 6)  Aspirin 325 Mg Tabs (Aspirin) .Marland Kitchen.. 1 daily otc  Other Orders: Consultation Level IV [id number]) Pulmonary Referral (Pulmonary) Admin 1st Vaccine [id number]) Flu Vaccine 75yrs + [id number])  Patient Instructions: 1)  Please schedule a follow-up appointment in 6 weeks 2)  See [facility] to schedule PFT and 6 minute walk test 3)  Copy of EKG for you to cary to Dr Dagmar Hait on Thursday 4)  Take one standard adult aspirin tablet,  325 mg daily 5)  Flu vax 6)  sample Xopenex HFA rescue inhaler: 2 puffs up to 4 times daily if needed for chest tightness or wheeze. Flu Vaccine Consent Questions     Do you have a history of severe allergic reactions to this vaccine? no    Any prior history of allergic reactions to egg and/or gelatin? no    Do you have a sensitivity to the preservative Thimersol? no    Do you have a past history of Guillan-Barre Syndrome? no    Do you currently have an acute febrile illness? no    Have you ever had a severe reaction to latex? no    Vaccine information given and explained to patient? yes    Are you currently pregnant? no    Lot Number:[id number]   Exp Date:09/20/2010   Site Given  Left Deltoid IM     .lbflu Kristine [name] CMA  December 02, 2009 5:41 PM   EKG  Procedure date:  12/02/2009  Findings:      Patient: Kristine [name] Note: All result statuses are Final unless otherwise noted.  Tests: (1) [facility] ECG Observations [id number])   Heart Rate                116 BPM   PR Interval               0 ms   QT Interval               328 ms   QTc Interval              426 ms   QRS Duration              94 ms   P Axis  1 deg   EKG QRS axis              16 deg   T Axis                    45 deg   Interpretation            'Result Below...'       RESULT: Atrial fibrillation  Low voltage in precordial leads.   ABNORMAL  Note: An exclamation mark (!) indicates a result that was not dispersed into the flowsheet. Document Creation Date: 12/02/2009 4:32 PM _______________________________________________________________________  (1) Order result status: Roney Mans

## 2010-04-24 NOTE — Progress Notes (Signed)
Summary: Nuclear pre procedure  Phone Note Outgoing Call Call back at [facility] Phone [phone]   Call placed by: Valetta Fuller, [place],  November 06, 2009 4:29 PM Call placed to: Patient Summary of Call: Reviewed information on Myoview Information Sheet (see scanned document for further details).  Kristine [name] spoke with patient.      Nuclear Med Background Indications for Stress Test: Evaluation for Ischemia, Abnormal EKG   History: Echo  History Comments: 05/04/09 Echo:normal LVF  Symptoms: DOE    Nuclear Pre-Procedure Cardiac Risk Factors: Hypertension, NIDDM, Obesity

## 2010-04-24 NOTE — Assessment & Plan Note (Signed)
Summary: Cardiology Nuclear Testing  Nuclear Med Background Indications for Stress Test: Evaluation for Ischemia, Abnormal EKG   History: Echo  History Comments:  ~10ys ago Stress Echo:OK per patient; 05/04/09 Echo:normal EF  Symptoms: Chest Tightness with Exertion, Dizziness, DOE, Fatigue, Nausea, Palpitations, Rapid HR  Symptoms Comments: Chest tightness>neck, jaw and shoulders. Last episode of [id number]:[id number] week.   Nuclear Pre-Procedure Cardiac Risk Factors: Hypertension, NIDDM, Obesity Caffeine/Decaff Intake: None NPO After: 4:30 AM Lungs: Clear.  O2 Sat 97% on RA. IV 0.9% NS with Angio Cath: 22g     IV Site: Rt Hand IV Started by: Crissie Figures RN Chest Size (in) 44     Cup Size D     Height (in): 68 Weight (lb): 255 BMI: 38.91 Tech Comments: Bystolic held x 23 hours.  Nuclear Med Study 1 or 2 day study:  1 day     Stress Test Type:  treadmill/Lexiscan Reading MD:  Glori Bickers, MD     Referring MD:  Mertie Moores, MD Resting Radionuclide:  Technetium 60m Tetrofosmin     Resting Radionuclide Dose:  10.5 mCi  Stress Radionuclide:  Technetium 4m Tetrofosmin     Stress Radionuclide Dose:  33.0 mCi   Stress Protocol Exercise Time (min):  1:00 min     Max HR:  106 bpm     Predicted Max HR:  0000000 bpm  Max Systolic BP: A999333 mm Hg     Percent Max HR:  67.09 %Rate Pressure Product:  22260  Lexiscan: 0.4 mg   Stress Test Technologist:  Valetta Fuller CMA-N     Nuclear Technologist:  Charlton Amor CNMT  Rest Procedure  Myocardial perfusion imaging was performed at rest 45 minutes following the intravenous administration of Myoview Technetium 70m Tetrofosmin.  Stress Procedure  The patient received IV Lexiscan 0.4 mg over 15-seconds with concurrent low level exercise and then myoview was injected at 30-seconds while the patient continued walking one more minute.  There were no significant changes with infusion.  She did have a hypertensive response, 210/86.  Quantitative spect  images were obtained after a 45 minute delay.  QPS Raw Data Images:  There is a breast shadow on the stress images. Stress Images:  Decreased uptake in the anterior wall.  Rest Images:  Normal homogeneous uptake in all areas of the myocardium. Subtraction (SDS):  There is a reversible anterior defect which I suspect is shifting breast attenuation but cannot exclude anterior ischemia. Transient Ischemic Dilatation:  0.94  (Normal <1.22)  Lung/Heart Ratio:  0.39  (Normal <0.45)  Quantitative Gated Spect Images QGS EDV:  107 ml QGS ESV:  32 ml QGS EF:  70 % QGS cine images:  Normal   Overall Impression  Exercise Capacity: [place] study with no exercise. ECG Impression: Baseline: NSR; No significant ST segment change with Lexiscan. Overall Impression: Abnormal stress nuclear study. Overall Impression Comments: There is a reversible anterior defect which I suspect is shifting breast attenuation but cannot exclude anterior ischemia.

## 2010-04-30 NOTE — H&P (Signed)
Kristine [name], HANEK              ACCOUNT NO.:  [id number]  MEDICAL RECORD NO.:  [id number]          PATIENT TYPE:  INP  LOCATION:  5128                         FACILITY:  [facility]  PHYSICIAN:  Ermalene Searing. Philip Aspen, M.D.DATE OF BIRTH:  Mar 10, 1946  DATE OF ADMISSION:  04/12/2010 DATE OF DISCHARGE:                             HISTORY & PHYSICAL   CHIEF COMPLAINT:  Fever and malaise.  HISTORY OF PRESENT ILLNESS:  The patient is a 65 year old white woman with a 3- to 4-day history of increasing weakness, cough, and left eye irritation.  Yesterday, she had fever and was seen by the local family practice physician, Dr. Redge Gainer, and started on a Z-Pak and tobramycin, dexamethasone eyedrops for her left eye irritation.  Despite this she felt weaker today and continued to have fever with decreased appetite, nausea, and green-colored emesis.  She also has diarrhea and rhinitis with a mildly productive cough and mild shortness of breath. She called 911 and was transported to the emergency room for evaluation. Currently, she continues to have mild nausea and had some vomiting in the emergency room of green-colored emesis.  She has not had recent antibiotics and she did have the 2011-2012 influenza vaccine this past year.  PAST MEDICAL HISTORY:  Chronic mild dyspnea on exertion, chronic atrial fibrillation, exogenous obesity, dyslipidemia with September 2011 total cholesterol 183, triglycerides 265, HDL cholesterol 29, LDL cholesterol 101, diabetes mellitus type 2, interstitial cystitis, lichen planus, hypertension, abnormal EKG, atypical chest pain leading to hospital admission and August 2011 cardiac catheterization that showed normal, smooth coronary arteries with a left ventricular ejection fraction of 55- 60%.  She also has mild aortic sclerosis.  She has osteoarthritis and degenerative disk disease in the lower lumbar spine.  She also has osteoporosis and vitamin D deficiency.  She  has mild depression as well.  MEDICATIONS:  Prior to admission: 1. Crestor 10 mg p.o. daily. 2. Lasix 40 mg p.o. daily. 3. Ambien 10 mg p.o. nightly. 4. Toprol-XL 150 mg p.o. daily. 5. Pradaxa 150 mg p.o. b.i.d. 6. Digoxin 0.25 mg p.o. daily. 7. Questionable Celexa 20 mg daily. 8. Actoplus Met twice daily. 9. She gets Reclast annually.  ALLERGIES:  PENICILLIN has been associated with flushing and LIDOCAINE has been associated with palpitations.  There was a thought that she has an IV contrast dye allergy, however this past year she had a cardiac catheterization, had no reaction whatsoever to contrast.  PAST SURGICAL HISTORY:  Total abdominal hysterectomy, laparoscopic cholecystectomy, bladder suspension procedure, mallet finger operation and May 2010 colonoscopy that showed moderate diverticulosis and no polyps.  PHYSICIANS INVOLVED IN CARE:  Berneta Sages, MD; Thayer Headings, MD; Kasandra Knudsen. Annamaria Boots, MD, FCCP, FACP; Lucina Mellow. Terance Hart, MD; Loralee Pacas. Sharlett Iles, MD, Quentin Ore, FAGA; Susa Day, MD  SOCIAL HISTORY:  She is married and has 4 children.  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  She has no history of tobacco or alcohol abuse.  FAMILY HISTORY:  Father died at age 22 of stroke.  Mother died at age 33 of congestive heart failure.  One of her siblings has  premature cardiovascular disease.  There is no family history of breast cancer or colon cancer.  REVIEW OF SYSTEMS:  She had the Pneumovax vaccination in 2008.  Lately she has had a mild bifrontal headache with mild rhinitis and myalgias. She has irritation in the left eye and chronic, somewhat blurred vision in the left eye felt due to cataracts.  Over the past few days, she has had some nausea, vomiting, and diarrhea.  She has not had bloody stools and has not had recent antibiotics other than the Zithromax started yesterday.  INITIAL PHYSICAL EXAMINATION:  VITAL SIGNS:  Blood pressure 97/48,  pulse 84, respirations 22, temperature 99.4, pulse oxygen saturation was 95% on room air. GENERAL:  She is an overweight white woman who is in no apparent distress while lying supine. HEAD, EYES, EARS, NOSE AND THROAT:  Significant for dry eyes and left- sided scleritis with a minimal crusty exudate. NECK:  Supple without jugular venous distention or carotid bruit. CHEST:  Clear to auscultation. HEART:  Irregularly irregular rhythm without significant murmur. ABDOMEN:  Had normal bowel sounds and no hepatosplenomegaly or tenderness. EXTREMITIES:  Without cyanosis, clubbing, or edema and pedal pulses were normal. NEUROLOGIC:  She is alert and well oriented with normal affect.  She showed no focal neurologic deficits.  INITIAL LABORATORY STUDIES:  Serum sodium 131, potassium 3.8, chloride 94, carbon dioxide 25, BUN 8, creatinine 0.81, glucose 303, total protein 6.5, albumin 3.3, white blood cell count was 12.5 with 85% neutrophils, hemoglobin 12, hematocrit 37.9, platelets 180, serum lactate was 2.6 (0.5-2.2).  Urinalysis was nitrite positive with many bacteria.  Procalcitonin level was 0.94.  Chest x-ray showed right base airspace disease with peribronchial thickening.  EKG telemetry shows atrial fibrillation.  November 2011 labs included BUN 10, creatinine 0.6, white blood cell count 10.2, hemoglobin 12, hematocrit 40, platelets 284.  IMPRESSION AND PLAN: 1. Febrile illness:  This is most likely from early right lower lobe     pneumonia.  It is less likely that her fever and diffuse symptoms     are from urinary tract infection or viral infection.  It is     important to note if she does have acute influenza for     epidemiological purposes.  She is fairly early on in her course of     fever and might do well with addition of Tamiflu to her regimen.     We will continue Rocephin and Zithromax for now.  We will check     results of blood and urine cultures already sent and we will  check     a rapid flu test.  We will continue moderate dose IV fluids as she     appears to be somewhat dehydrated. 2. Diabetes mellitus, type 2:  Initial CBGs in a febrile state are     somewhat high.  We will initiate sliding scale insulin treatment     a.c. t.i.d. and nightly p.r.n.. 3. Left-sided scleritis.  This is most likely secondary to a viral     infection.  Bacterial conjunctivitis seems less     likely given the paucity of exudate.  We will treat her with     Ciloxan ointment  appointment at 2 drops t.i.d. 4. Atrial fibrillation:  She has chronic atrial fibrillation and     currently has a somewhat elevated ventricular rate because of her     fever.  We will use Tylenol to lower her fever and if necessary low-  dose Cardizem.          ______________________________ Ermalene Searing Philip Aspen, M.D.     DGP/MEDQ  D:  04/12/2010  T:  04/13/2010  Job:  [id number]  cc:   Berneta Sages, M.D.  Electronically Signed by Leanna Battles M.D. on 04/30/2010 07:56:34 AM

## 2010-05-07 NOTE — Discharge Summary (Signed)
Kristine [name], Kristine [name]              ACCOUNT NO.:  [id number]  MEDICAL RECORD NO.:  [id number]          PATIENT TYPE:  INP  LOCATION:  3736                         FACILITY:  [facility]  PHYSICIAN:  Berneta Sages, M.D.        DATE OF BIRTH:  11-04-1945  DATE OF ADMISSION:  04/12/2010 DATE OF DISCHARGE:  04/21/2010                        DISCHARGE SUMMARY - REFERRING   DISCHARGE DIAGNOSES: 1. Right-sided pneumonia. 2. Atrial fibrillation with rapid ventricular response. 3. Anticoagulation with Pradaxa complicated by D-dimer in the setting     of elevated right-sided pulmonary artery pressures. 4. Diastolic dysfunction with resulting congestive heart failure. 5. Increased pulmonary artery pressures secondary to the diastolic     dysfunction corrected with diuresis. 6. Type 2 diabetes, uncontrolled, now insulin dependent. 7. Hypertension. 8. Anxiety. 9. Sweats, presumably secondary to the administration of Rocephin.  SECONDARY DIAGNOSES: 1. Exogenous obesity. 2. Dyslipidemia. 3. History of interstitial cystitis. 4. Lichen planus. 5. History of atypical chest pain in August 2011, with cardiac     catheterization showing normal smooth coronary arteries with a left     ventricular ejection fraction of 55-60%. 6. Mild aortic sclerosis. 7. Osteoarthritis. 8. Degenerative disk disease in the lumbar spine. 9. Osteoporosis. 10.Vitamin D deficiency. 11.Mild depression and anxiety.  PAST SURGICAL HISTORY: 1. Total abdominal hysterectomy. 2. Laparoscopic cholecystectomy. 3. Bladder suspension procedure. 4. Mallet finger operation. 5. Colonoscopy in May 2010, showing moderate diverticulosis, no     polyps.  DISCHARGE MEDICATIONS: 1. Alprazolam 0.25 mg 1 p.o. b.i.d. p.r.n. 2. Citalopram 20 mg p.o. daily with meals. 3. Diltiazem 240 mg extended release 1 p.o. daily. 4. Guaifenesin 600 mg p.o. b.i.d. 5. Lantus insulin 15 units subcu daily. 6. Metformin 500 mg 1 p.o. b.i.d. with food. 7.  Metoprolol succinate 150 mg 1 p.o. daily. 8. Avelox 40 mg p.o. daily x5 days. 9. Crestor 10 mg each day. 10.Ambien 10 mg p.o. nightly p.r.n. 11.Tylenol 500 mg daily as needed. 12.Digoxin 0.25 mg p.o. daily. 13.Furosemide 40 mg p.o. daily. 14.Pradaxa 150 mg p.o. b.i.d. 15.Vitamin D2, 1.25 mg 1 capsule weekly on Mondays.  DISCHARGE LABORATORY DATA:  Last chest x-ray on April 18, 2010, revealed a continued right-sided pneumonia, cardiomegaly, and mild edema.  Echocardiogram on April 20, 2010, revealed EF 60-65%, left and right atrium mildly dilated, pulmonary artery pressure was 53 with comparison to previous echo showing decreased RV systolic pressures after significant diuresis.  D-dimer at the time of volume overload was 0.68.  BNP 354.  Blood cultures on April 12, 2010, were negative x2. Urine culture positive, but contaminated per the patient report being asymptomatic.  White blood cell count 9.1, hemoglobin 11.1, hematocrit 34.6%, platelet count 324.  Sodium 141, potassium 3.7, BUN 7, creatinine 0.58, glucose 143.  AST 19, ALT 32, alkaline phosphatase 71, total bilirubin 0.8.  EKG reveals atrial fibrillation.  HISTORY OF PRESENT ILLNESS:  Please see history and physical dictated by Dr. Leanna Battles on April 12, 2010, for extensive details. However, this is a 65 year old Caucasian female with the above-mentioned history, who presented with a 3-4-day history of increasing weakness, cough, and left eye irritation, seen by a  local physician, Dr. Redge Gainer and started on a Z-Pak, tobramycin, and dexamethasone eye drops for her left eye irritation.  Despite this, felt weaker with fevers that were in excess of 102 degrees with decreased appetite associated with nausea and vomiting of green-colored emesis.  She also had diarrhea and had mild shortness of breath.  She called 911 and presented to the emergency room for further evaluation and management where she had continued  nausea and vomiting and was thought secondary to right lower lobe pneumonia complicated by possible viral infection and/or question of a urinary tract infection.  She was initiated on Rocephin and azithromycin.  Blood cultures and urine cultures were sent.  She was also thought to have a left-sided scleritis, likely secondary to viral infection.  However, she was also initiated on Ciloxan ointment.  She was in chronic AFib with a somewhat elevated ventricular rate because of her fever and was initiated and continued on her beta-blocker dose.  HOSPITAL COURSE: 1. With respect to the patient's pneumonia, she was initiated on     Rocephin and azithromycin and continued to have significant sweats     after the administration of these 2 antibiotics during the first 4-     5 days of her hospitalization.  She does have a history of a     PENICILLIN allergy and these medications were discontinued and     changed over to Avelox, after which, she had no further sweats.     Her chest x-ray did mildly improve and clinically, she improved     with no further fevers and improvement in her appetite and     resolution of some abnormal liver function tests and clinically had     minimal cough at the time of discharge with normal oxygen     saturation and a normal white blood cell count.  The patient did develop and was in chronic atrial fibrillation with a rapid ventricular response in the setting of her fever, cough, and some mild volume overload as well as possible anxiety.  Her beta-blocker was continued and she was initiated on a calcium channel blocker drip which was discontinued after resolution of her rapid ventricular response. However, this recurred during her hospitalization and she was initiated on p.o. at first in the short-acting version and then the long-acting version with control of her heart rate.  Prior to discharge, echocardiograms were obtained which did reveal normal left  ventricular ejection fraction, but significantly increased pulmonary artery pressures.  D-dimer was noted to be elevated, however, this was while the patient was on Pradaxa and then subsequently subcu Lovenox at therapeutic levels.  She was diuresed a significant amount of fluid, at which time, her pulmonary artery pressures did decrease a significant amount as evaluated by Cardiology as well as her echocardiograms.  It was thought by Cardiology that she did not need CT angiogram to rule out pulmonary embolism and that her elevated pulmonary artery pressures and D-dimer were related to her volume overload in the setting of diastolic dysfunction.  She was diuresed 24-48 hours prior to discharge and felt much better where she was saturating well on room air and at discharge, had mild rales at the right base, but otherwise had no evidence of volume overload by physical exam except for some trace edema.  The patient does have uncontrolled type 2 diabetes in the setting of morbid obesity and longstanding type 2 diabetes.  She was initiated on Lantus insulin during this hospitalization.  Her  Actos was discontinued given volume shifts and the problems with TZDs thereof.  She will also be discharged on metformin with close followup and evaluation. 1. Hypertension slightly increased and we will recheck on an     outpatient basis after continued diuresis with Lasix, calcium     channel blocker, and beta-blocker.  Please note that she may not     have been taking her Lasix on a regular basis prior to admission. 2. Anxiety complicated by depression.  The patient will use citalopram     and this was initiated during this hospitalization after extensive     discussion.  Alprazolam will be used on a p.r.n. basis.  Side     effects and potential pros and cons of an SSRI were discussed with     the patient. 3. Sweats, presumably secondary to Rocephin given her PENICILLIN     allergy, but these have  resolved.  DISPOSITION:  The patient was deemed appropriate for discharge on April 21, 2010, with close outpatient followup scheduled with Dr. Acie Fredrickson within the next 24 hours and with myself in the next 7-10 days.     Berneta Sages, M.D.     RA/MEDQ  D:  04/21/2010  T:  04/21/2010  Job:  [id number]  cc:   Thayer Headings, M.D.  Electronically Signed by Berneta Sages M.D. on 05/07/2010 09:57:50 PM

## 2010-05-12 ENCOUNTER — Ambulatory Visit (INDEPENDENT_AMBULATORY_CARE_PROVIDER_SITE_OTHER): Payer: 59 | Admitting: Internal Medicine

## 2010-05-12 ENCOUNTER — Ambulatory Visit (INDEPENDENT_AMBULATORY_CARE_PROVIDER_SITE_OTHER)
Admission: RE | Admit: 2010-05-12 | Discharge: 2010-05-12 | Disposition: A | Discharge status: Home / Self Care | Payer: 59 | Source: Ambulatory Visit | Attending: Internal Medicine | Admitting: Internal Medicine

## 2010-05-12 ENCOUNTER — Other Ambulatory Visit: Payer: Self-pay | Admitting: Internal Medicine

## 2010-05-12 ENCOUNTER — Encounter: Payer: Self-pay | Admitting: Internal Medicine

## 2010-05-12 DIAGNOSIS — R0989 Other specified symptoms and signs involving the circulatory and respiratory systems: Secondary | ICD-10-CM

## 2010-05-12 DIAGNOSIS — I4891 Unspecified atrial fibrillation: Secondary | ICD-10-CM

## 2010-05-12 DIAGNOSIS — R042 Hemoptysis: Secondary | ICD-10-CM

## 2010-05-12 DIAGNOSIS — R0609 Other forms of dyspnea: Secondary | ICD-10-CM

## 2010-05-20 NOTE — Assessment & Plan Note (Signed)
Summary: 4 month return/mhh   Primary Provider/Referring Provider:  R. Avva  CC:  4 month follow up visit-dyspnea..  History of Present Illness:  January 13, 2010- DOE, AFib Nurse-CC: 6 week follow up visit-review 55mw and PFT results. She was referred last visit for dyspnea evaluation and was shown to have PAF, Since then she has done much better. Dr Acie Fredrickson added Pradaxa, Digoxin and Toprol XL. They stopped aspirin and benazepril. With heart rate control her dyspnea has been better,  She has had pneumonia x 4. Pneumovax x 2. Discussed weight loss and aerobic exercise.  CXRs were reviewed last time on film and returned to [place]. They had sugested mild interstitial fluid loading.  PFT-12/05/09- Mild restriction TLC 73%, DLCO 69%, Spirometry WNL w/ resp to BD 6MWT-12/05/09- 100%, 98%, 98% 368 m.  May 12, 2010- DOE, AFib Nurse-CC: 4 month follow up visit-dyspnea. Hosp January 17 by Dr Dagmar Hait with fever and pneumonia. Now backto work, still a little tired. Finished Avelox. Albuterol triggered her atrial fib. Otherwise her breathing has remained better since CHF found initially. Has had pneumonia x 5 in last 10 years. Had pneumovax x 2.  On Pradaxa she coughed a little bloody mucus today. Denies dyspnea except heavier exertion like pulling trash can. Not coughing much. She would like me to get a f/u CXR today while she is here.  She is making an effort to lose weight with careful diet now, and we discussed how that can help her dyspnea as well.       Preventive Screening-Counseling & Management  Alcohol-Tobacco     Smoking Status: never     Passive Smoke Exposure: yes     Passive Smoke Counseling: to avoid passive smoke exposure  Current Medications (verified): 1)  Ambien 10 Mg Tabs (Zolpidem Tartrate) .... Take 1 By Mouth At Bedtime As Needed 2)  Furosemide 40 Mg Tabs (Furosemide) .... Take 1 By Mouth Once Daily 3)  Pradaxa 150 Mg Caps (Dabigatran Etexilate Mesylate) .... Take 1 By  Mouth Once Daily 4)  Toprol Xl 50 Mg Xr24h-Tab (Metoprolol Succinate) .... Take 1 By Mouth Once Daily 5)  Digoxin 0.25 Mg Tabs (Digoxin) .... Take 1 By Mouth Once Daily 6)  Metformin Hcl 500 Mg Tabs (Metformin Hcl) .... Take 1 By Mouth Two Times A Day 7)  Lantus 100 Unit/ml Soln (Insulin Glargine) .Marland Kitchen.. 18 Units Every Night 8)  Cardizem La 240 Mg Xr24h-Tab (Diltiazem Hcl Coated Beads) .... Take 1 By Mouth Once Daily  Allergies: 1)  ! Pcn 2)  ! * Ivp Dye 3)  ! * Albuterol 4)  ! * Bee Stings  Past History:  Past Medical History: Last updated: 01/13/2010  1. Diabetes mellitus.   2. Hypertension.   3. Abnormal EKG.   4. Abnormal Myoview study. Nl cardiac cath 2011 Dr Acie Fredrickson  5. Dyspnea on exertion.  PFT 12/05/09- FEV1/FVC 0.81, mild resp to BD, TLC 73%, DLCO 69%, 6MWT 368 m.  6. Paroxysmal Atrial Fib 12/02/09  Past Surgical History: Last updated: 12/02/2009 cholecystectomy,  hysterectomy  Family History: Last updated: 11-24-09  Her father died at age 54 of a CVA.  Her mother died at   age 10 of congestive heart failure.      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  Never converted PPD skin test     Risk  Factors: Smoking Status: never (05/12/2010) Passive Smoke Exposure: yes (05/12/2010)  Review of Systems      See HPI       The patient complains of shortness of breath with activity, coughing up blood, and irregular heartbeats.  The patient denies shortness of breath at rest, productive cough, non-productive cough, chest pain, indigestion, loss of appetite, weight change, abdominal pain, difficulty swallowing, sore throat, tooth/dental problems, headaches, nasal congestion/difficulty breathing through nose, and sneezing.    Vital Signs:  Patient profile:   65 year old female Height:      68 inches Weight:       234.50 pounds BMI:     35.78 O2 Sat:      96 % on Room air Pulse rate:   93 / minute BP sitting:   156 / 84  (left arm) Cuff size:   large  Vitals Entered By: Clayborne Dana CMA (May 12, 2010 9:31 AM)  O2 Flow:  Room air CC: 4 month follow up visit-dyspnea.   Physical Exam  Additional Exam:  General: A/Ox3; pleasant and cooperative, NAD, 132/72, p 113. room air sat 96%.  SKIN: no rash, lesions NODES: no lymphadenopathy HEENT: [place]/AT, EOM- WNL, Conjuctivae- clear, PERRLA, TM-WNL, Nose- clear, Throat- clear and wnl, Mallampati  II NECK: Supple w/ fair ROM, JVD- none, normal carotid impulses w/o bruits Thyroid- normal to palpation CHEST: Clear to P&A, unlabored, no wheeze or cough HEART: Regular with frequent extra beats and whiff of systolic left parasternal murmur.  ABDOMEN: Soft and nl; nml bowel sounds; no organomegaly or masses noted, overweight [id number], nl pulses, no edema under elastic hose.  NEURO: Grossly intact to observation      Impression & Recommendations:  Problem # 1:  DYSPNEA ON EXERTION (ICD-786.09) Assessment Unchanged  Cardiogenic dyspnea. Now with a little blood in sputum on Pradaxa after pneumonia. I will do CXR as she requests, while here today. I have suggested that she see me again as needed and maintain management with her internist and cardiologist.  Her updated medication list for this problem includes:    Furosemide 40 Mg Tabs (Furosemide) .Marland Kitchen... Take 1 by mouth once daily    Toprol Xl 50 Mg Xr24h-tab (Metoprolol succinate) .Marland Kitchen... Take 1 by mouth once daily  Problem # 2:  ATRIAL FIBRILLATION, PAROXYSMAL (ICD-427.31) Note sensitivity to albuterol. She may need anticholinergics and Xopenex if bronchodilators must be used.  Her updated medication list for this problem includes:    Toprol Xl 50 Mg Xr24h-tab (Metoprolol succinate) .Marland Kitchen... Take 1 by mouth once daily    Digoxin 0.25 Mg Tabs (Digoxin) .Marland Kitchen... Take 1 by mouth once daily  Problem # 3:   HEMOPTYSIS UNSPECIFIED (ICD-786.30) Hemorrhagic bronchitis during pneumonia while on anticoagulant. This may happen from time to time. Usually antibiotics and cough suppressants should provide control.   Medications Added to Medication List This Visit: 1)  Metformin Hcl 500 Mg Tabs (Metformin hcl) .... Take 1 by mouth two times a day 2)  Lantus 100 Unit/ml Soln (Insulin glargine) .Marland Kitchen.. 18 units every night 3)  Cardizem La 240 Mg Xr24h-tab (Diltiazem hcl coated beads) .... Take 1 by mouth once daily  Other Orders: Est. Patient Level III [id number] T-2 View CXR ([id number])  Patient Instructions: 1)  Please schedule a follow-up appointment as needed. I'll be happy to see you again if you should need me.  2)  A chest x-ray has been recommended.  Your imaging study may require preauthorization.  3)  ................................................................. 4)  Clinical Data: Pneumonia 5)    6)  CHEST - 2 VIEW 7)    8)  Comparison: 04/18/2010 9)   Findings: There has been interval improvement in lung aeration 10)  bilaterally.  The lungs are better expanded and there is less 11)  prominent airspace disease on the right.  Left lung is clear. 12)  There is some residual airspace opacity in the right lung base. 13)  Cardiopericardial silhouette is at upper limits of normal for size. 14)  Imaged bony structures of the thorax are intact. 15)    16)  IMPRESSION: 17)  Improved lung expansion with interval decrease in right lung 18)  airspace disease. 19)    20)  Original Report Authenticated By: ERIC A. MANSELL, M.D. 21)  cc Dr Dagmar Hait, Dr Acie Fredrickson

## 2010-05-27 ENCOUNTER — Encounter: Payer: Self-pay | Admitting: Cardiovascular Disease

## 2010-05-27 DIAGNOSIS — IMO0001 Reserved for inherently not codable concepts without codable children: Secondary | ICD-10-CM | POA: Insufficient documentation

## 2010-05-27 DIAGNOSIS — M419 Scoliosis, unspecified: Secondary | ICD-10-CM | POA: Insufficient documentation

## 2010-05-27 DIAGNOSIS — E7800 Pure hypercholesterolemia, unspecified: Secondary | ICD-10-CM | POA: Insufficient documentation

## 2010-05-27 DIAGNOSIS — IMO0002 Reserved for concepts with insufficient information to code with codable children: Secondary | ICD-10-CM | POA: Insufficient documentation

## 2010-05-27 DIAGNOSIS — R0609 Other forms of dyspnea: Secondary | ICD-10-CM | POA: Insufficient documentation

## 2010-05-27 DIAGNOSIS — M4800 Spinal stenosis, site unspecified: Secondary | ICD-10-CM | POA: Insufficient documentation

## 2010-05-27 DIAGNOSIS — J189 Pneumonia, unspecified organism: Secondary | ICD-10-CM | POA: Insufficient documentation

## 2010-05-27 DIAGNOSIS — E119 Type 2 diabetes mellitus without complications: Secondary | ICD-10-CM | POA: Insufficient documentation

## 2010-05-27 DIAGNOSIS — F419 Anxiety disorder, unspecified: Secondary | ICD-10-CM | POA: Insufficient documentation

## 2010-05-27 DIAGNOSIS — R9430 Abnormal result of cardiovascular function study, unspecified: Secondary | ICD-10-CM | POA: Insufficient documentation

## 2010-05-27 DIAGNOSIS — E559 Vitamin D deficiency, unspecified: Secondary | ICD-10-CM | POA: Insufficient documentation

## 2010-05-27 DIAGNOSIS — M1990 Unspecified osteoarthritis, unspecified site: Secondary | ICD-10-CM | POA: Insufficient documentation

## 2010-05-27 DIAGNOSIS — R0600 Dyspnea, unspecified: Secondary | ICD-10-CM | POA: Insufficient documentation

## 2010-05-27 DIAGNOSIS — M810 Age-related osteoporosis without current pathological fracture: Secondary | ICD-10-CM | POA: Insufficient documentation

## 2010-05-28 ENCOUNTER — Encounter: Payer: Self-pay | Admitting: Cardiovascular Disease

## 2010-06-03 LAB — BASIC METABOLIC PANEL
CO2: 26 mEq/L (ref 19–32)
Chloride: 99 mEq/L (ref 96–112)
GFR calc Af Amer: >60 mL/min (ref >60)
Potassium: 3.5 mEq/L (ref 3.5–5.1)
Sodium: 136 mEq/L (ref 135–145)

## 2010-06-03 LAB — CBC
HCT: 40.5 % (ref 36.0–46.0)
Hemoglobin: 13 g/dL (ref 12.0–15.0)
MCH: 27.2 pg (ref 26.0–34.0)
MCV: 84.7 fL (ref 78.0–100.0)
Platelets: 269 10*3/uL (ref 150–400)
RBC: 4.78 MIL/uL (ref 3.87–5.11)
WBC: 13.2 10*3/uL — ABNORMAL HIGH (ref 4.0–10.5)

## 2010-06-03 LAB — GLUCOSE, CAPILLARY: Glucose-Capillary: 165 mg/dL — ABNORMAL HIGH (ref 70–99)

## 2010-06-06 LAB — POCT I-STAT GLUCOSE: Operator id: 141321

## 2010-06-10 ENCOUNTER — Ambulatory Visit: Payer: 59 | Admitting: *Deleted

## 2010-06-17 ENCOUNTER — Encounter: Payer: 59 | Attending: Internal Medicine | Admitting: Dietician

## 2010-06-17 DIAGNOSIS — Z713 Dietary counseling and surveillance: Secondary | ICD-10-CM | POA: Insufficient documentation

## 2010-06-17 DIAGNOSIS — E119 Type 2 diabetes mellitus without complications: Secondary | ICD-10-CM | POA: Insufficient documentation

## 2010-06-20 ENCOUNTER — Encounter: Payer: Self-pay | Admitting: Cardiovascular Disease

## 2010-06-20 ENCOUNTER — Ambulatory Visit (INDEPENDENT_AMBULATORY_CARE_PROVIDER_SITE_OTHER): Payer: 59 | Admitting: Cardiovascular Disease

## 2010-06-20 VITALS — BP 140/60 | HR 64 | Wt 232.0 lb

## 2010-06-20 DIAGNOSIS — I10 Essential (primary) hypertension: Secondary | ICD-10-CM

## 2010-06-20 DIAGNOSIS — I4891 Unspecified atrial fibrillation: Secondary | ICD-10-CM

## 2010-06-20 DIAGNOSIS — I4819 Other persistent atrial fibrillation: Secondary | ICD-10-CM | POA: Insufficient documentation

## 2010-06-20 NOTE — Progress Notes (Signed)
History of Present Illness:  Kristine [name] is a middle-aged female with a history of chronic atrial fibrillation, obesity, hypertension and chest pain. She's had a normal heart catheterization in August of 2011. She's been on Pradaxa and has felt quite well. She's tolerating all medications very well. She is not having any symptoms with her atrial fibrillation.  Current Outpatient Prescriptions on File Prior to Visit  Medication Sig Dispense Refill  . Cholecalciferol (VITAMIN D PO) Take by mouth once a week.        . citalopram (CELEXA) 20 MG tablet Take 20 mg by mouth daily.        . dabigatran (PRADAXA) 150 MG CAPS Take 150 mg by mouth every 12 (twelve) hours.        . digoxin (LANOXIN) 0.25 MG tablet Take 250 mcg by mouth daily.        Marland Kitchen diltiazem (DILACOR XR) 240 MG 24 hr capsule Take 240 mg by mouth daily.        . furosemide (LASIX) 40 MG tablet Take 40 mg by mouth daily.        . GuaiFENesin (MUCINEX PO) Take by mouth as needed.        . insulin glargine (LANTUS) 100 UNIT/ML injection Inject 15 Units into the skin every morning.        . metoprolol (TOPROL XL) 100 MG 24 hr tablet Take 150 mg by mouth daily.        . rosuvastatin (CRESTOR) 10 MG tablet Take 10 mg by mouth daily.        Marland Kitchen zolpidem (AMBIEN) 10 MG tablet Take 10 mg by mouth at bedtime as needed.          Allergies  Allergen Reactions  . Albuterol     REACTION: Tachycardia- AFib  . Bee Venom   . Penicillins     REACTION: flushing \\T \ hot    Past Medical History  Diagnosis Date  . Dyspnea on exertion   . Atrial fibrillation   . Morbid obesity   . Pneumonia   . Diabetes mellitus   . HTN (hypertension)   . Abnormal EKG   . Abnormal cardiovascular function study     MYOVIEW SHOWING ANTERIOR WALL ATTENUATION  . High cholesterol   . Scoliosis   . Spinal stenosis   . Anxiety   . Osteoarthritis   . Osteoporosis   . Mild aortic sclerosis   . Degenerative disc disease   . Vitamin D deficiency     Past Surgical  History  Procedure Date  . Finger fracture surgery   . Cardiac catheterization 11/16/09    SMOOTH AND NORMAL  . Total abdominal hysterectomy   . Laparoscopic cholecystectomy   . Bladder suspension     History  Smoking status  . Never Smoker   Smokeless tobacco  . Never Used    History  Alcohol Use No    Family History  Problem Relation Age of Onset  . Stroke Father   . Hypertension Father   . Atrial fibrillation Father     HAD MURMUR  . Heart failure Mother   . Hypertension Brother   . Hypertension Sister   . Hypertension Son     Reviw of Systems:  She denies any chest pain or shortness breath. She denies any syncope or presyncope. All other systems were reviewed and are negative.  Physical Exam: BP 140/60  Pulse 64  Wt 232 lb (105.235 kg) The patient is alert and oriented x  3.  The mood and affect are normal.  The skin is warm and dry.  Color is normal.  The HEENT exam reveals that the sclera are nonicteric.  The mucous membranes are moist.  The carotids are 2+ without bruits.  There is no thyromegaly.  There is no JVD.  The lungs are clear.  The chest wall is non tender.  The heart exam reveals an irregular rate with a normal S1 and S2.  There are no murmurs, gallops, or rubs.  The PMI is not displaced.   Abdominal exam reveals good bowel sounds.  There is no guarding or rebound.  There is no hepatosplenomegaly or tenderness.  There are no masses.  Exam of the legs reveal no clubbing, cyanosis, or edema.  The legs are without rashes.  The distal pulses are intact.  Cranial nerves II - XII are intact.  Motor and sensory functions are intact.  The gait is normal.  Assessment / Plan:

## 2010-06-20 NOTE — Assessment & Plan Note (Signed)
Itcel continues to have atrial fibrillation.We have performed a cardioversion in the past and she stayed in sinus rhythm for only several minutes. Her rate is well-controlled and she basically is asymptomatic. She is able to do all her normal activities without any significant problems. She's been on Pradaxa. We discussed starting her on an antiarrhythmic and try to cardiovert her again.She's happy with  a more conservative approach and was to continue with rate control and anticoagulation.We will continue with the same medications and I'll see her again in 6 months.

## 2010-06-20 NOTE — Assessment & Plan Note (Signed)
I've asked her to continue with a good diet and exercise program. She has lost 12 pounds since her last visit.

## 2010-07-24 ENCOUNTER — Other Ambulatory Visit: Payer: Self-pay | Admitting: Internal Medicine

## 2010-07-24 DIAGNOSIS — Z1231 Encounter for screening mammogram for malignant neoplasm of breast: Secondary | ICD-10-CM

## 2010-08-08 NOTE — Op Note (Signed)
[facility]  Patient:    Kristine [name], Kristine [name] Visit Number: [id number] MRN: [id number]          Service Type: SUR Location: 3W [id number] Attending Physician:  Harlin Rain Dictated by:   Shellia Carwin, M.D. Proc. Date: 07/04/01 Admit Date:  07/04/2001   CC:         Ravi R. Dagmar Hait, M.D.   Operative Report  PROCEDURE:  Laparoscopic cholecystectomy with intraoperative cholangiogram.  SURGEON:  Shellia Carwin, M.D.  ASSISTANT:  Sammuel Hines. Daiva Nakayama, M.D.  ANESTHESIA:  General endotracheal.  PREOPERATIVE DIAGNOSES:  Gallstones, cholecystitis.  POSTOPERATIVE DIAGNOSES:  Gallstones, cholecystitis, normal appearing IOC.  CLINICAL SUMMARY:  A 65 year old female with at least one year known history of gallstone on ultrasound. Symptoms have increased. She has a positive family history and her alkaline phosphatase was slightly elevated at 158.  OPERATIVE FINDINGS:  The patients gallbladder was not acutely inflamed or distended. It had one large stone in it that I could feel after it was removed. The liver was somewhat enlarged and the edges blunted and changes consistent with fatty infiltration noted.  DESCRIPTION OF PROCEDURE:  Under satisfactory general endotracheal anesthesia, having received 1.0 gm Ancef preop, the patients abdomen prepped and draped in a standard fashion. A transverse incision made above the umbilicus and the midline opened into the peritoneum. Controlled with a figure-of-eight #0 Vicryl suture and operating Hussan port inserted, secured. Good CO2 pneumoperitoneum established and camera placed. Under direct vision, through Marcaine infiltrated incisions, two #5 ports placed laterally and a second #10 medially. Graspers in the lateral ports gave excellent exposure and operating through the medial port, I carefully identified the gallbladder cystic duct junction and circumferentially dissected it. When certain of the anatomy, it was  clipped near the gallbladder and an incision made in the cystic duct. A cholangiogram catheter was placed and C-arm used to get good IOC. I did not see any abnormality. The catheter was removed and the cystic duct controlled with three metal clips and divided between these and the distal one. The cystic artery was likewise circumferentially dissected and when sure of its anatomy controlled with multiple metal clips and divided. The gallbladder was dissected from the liver bed with a coagulating spatula for hemostasis and dissection. A small hole was made in the gallbladder during the dissection and clear bile came out. This was lavaged away with copious amounts of saline. After the gallbladder was amputated, it was placed in an endocatch bag and secured. The operative site was checked for hemostasis which was good. The abdomen was lavaged with saline and suctioned dry.  Then the camera placed in the upper port and through the lower port, a large grasper used to retrieve the gallbladder intact in the bag without any spillage or complication.  Then the operative site was again checked, the ports and CO2 released. The midline closed with a previous figure-of-eight as well as a second interrupted #0 Vicryl suture. The subcu approximated with 4-0 Vicryl and the skin edges with Steri-Strips. Sterile dressings applied and the patient went to the recovery room from the operating room in good condition. Dictated by:   Shellia Carwin, M.D. Attending Physician:  Harlin Rain DD:  07/04/01 TD:  07/04/01 Job: [id number]

## 2010-09-01 ENCOUNTER — Encounter: Payer: Self-pay | Admitting: Cardiovascular Disease

## 2010-09-01 ENCOUNTER — Encounter: Payer: Self-pay | Admitting: Cardiology

## 2010-10-27 ENCOUNTER — Ambulatory Visit: Payer: 59

## 2010-10-31 ENCOUNTER — Ambulatory Visit
Admission: RE | Admit: 2010-10-31 | Discharge: 2010-10-31 | Disposition: A | Discharge status: Home / Self Care | Payer: 59 | Source: Ambulatory Visit | Attending: Internal Medicine | Admitting: Internal Medicine

## 2010-10-31 DIAGNOSIS — Z1231 Encounter for screening mammogram for malignant neoplasm of breast: Secondary | ICD-10-CM

## 2010-11-10 ENCOUNTER — Ambulatory Visit: Payer: 59

## 2010-12-02 ENCOUNTER — Telehealth: Payer: Self-pay | Admitting: Cardiovascular Disease

## 2010-12-02 NOTE — Telephone Encounter (Signed)
Pt calling re pradaxa, still has reflux, was told she could go on cerelto can it be changed? Uses [facility]

## 2010-12-02 NOTE — Telephone Encounter (Signed)
Continues to have GI upset with pradaxa and wants to switch to [facility], she will need refills to pharmacy. Please advise how long to be off pradaxa to start Xeralto and do you want 15 mg or 20 mg. Corwin Levins RN

## 2010-12-04 ENCOUNTER — Other Ambulatory Visit: Payer: Self-pay | Admitting: *Deleted

## 2010-12-04 MED ORDER — RIVAROXABAN 20 MG PO TABS: 1.0000 | ORAL_TABLET | ORAL | Status: DC

## 2010-12-04 NOTE — Telephone Encounter (Signed)
Pt contacted med ordered

## 2010-12-04 NOTE — Telephone Encounter (Signed)
Contact made, pt to stop pradaxa due to stomach upset and start xarelto. Pt verbalized understanding. Corwin Levins RN

## 2010-12-04 NOTE — Telephone Encounter (Signed)
DC Pradaxa and start Xarelto 20 mg a day the very next day.

## 2011-01-21 ENCOUNTER — Encounter: Payer: Self-pay | Admitting: Cardiovascular Disease

## 2011-01-21 ENCOUNTER — Ambulatory Visit (INDEPENDENT_AMBULATORY_CARE_PROVIDER_SITE_OTHER): Payer: BC Managed Care – PPO | Admitting: Cardiovascular Disease

## 2011-01-21 VITALS — BP 151/75 | HR 53 | Ht 68.5 in | Wt 224.8 lb

## 2011-01-21 DIAGNOSIS — I4891 Unspecified atrial fibrillation: Secondary | ICD-10-CM

## 2011-01-21 NOTE — Patient Instructions (Signed)
Your physician wants you to follow-up in: 6 months  You will receive a reminder letter in the mail two months in advance. If you don't receive a letter, please call our office to schedule the follow-up appointment.   Your physician has recommended you make the following change in your medication:   1) stop digoxin

## 2011-01-21 NOTE — Assessment & Plan Note (Signed)
She remains in atrial ablation care heart rate is fairly slow. We will stop her digoxin. I'll see her back in 6 months for an office visit and an EKG.

## 2011-01-21 NOTE — Progress Notes (Signed)
Kristine [name] Date of Birth  [birth year]  [facility] [address]    [place], [place]  [address] [phone]  Fax  [phone]  History of Present Illness:  Kristine [name] is a 65 year old female with a history of atrial fibrillation. She has done well since I last saw her.  She's not had any episodes of chest pain or shortness breath. Her blood pressure readings have been normal.  Current Outpatient Prescriptions on File Prior to Visit  Medication Sig Dispense Refill  . Cholecalciferol (VITAMIN D PO) Take by mouth once a week.        . citalopram (CELEXA) 20 MG tablet Take 20 mg by mouth daily.        . digoxin (LANOXIN) 0.25 MG tablet Take 250 mcg by mouth daily.        Marland Kitchen diltiazem (DILACOR XR) 240 MG 24 hr capsule Take 240 mg by mouth daily.        . furosemide (LASIX) 40 MG tablet Take 40 mg by mouth daily.        . GuaiFENesin (MUCINEX PO) Take by mouth as needed.        . insulin glargine (LANTUS) 100 UNIT/ML injection Inject 40 Units into the skin every morning.       . metoprolol (TOPROL XL) 100 MG 24 hr tablet Take 150 mg by mouth daily.        . Rivaroxaban 20 MG TABS Take 1 tablet by mouth 1 day or 1 dose.  30 tablet  5  . rosuvastatin (CRESTOR) 10 MG tablet Take 10 mg by mouth daily.        Marland Kitchen zolpidem (AMBIEN) 10 MG tablet Take 10 mg by mouth at bedtime as needed.          Allergies  Allergen Reactions  . Albuterol     REACTION: Tachycardia- AFib  . Bee Venom   . Penicillins     REACTION: flushing \\T \ hot    Past Medical History  Diagnosis Date  . Dyspnea on exertion   . Atrial fibrillation   . Morbid obesity   . Pneumonia   . Diabetes mellitus   . HTN (hypertension)   . Abnormal EKG   . Abnormal cardiovascular function study     MYOVIEW SHOWING ANTERIOR WALL ATTENUATION  . High cholesterol   . Scoliosis   . Spinal stenosis   . Anxiety   . Osteoarthritis   . Osteoporosis   . Mild aortic sclerosis   . Degenerative disc disease   .  Vitamin D deficiency     Past Surgical History  Procedure Date  . Finger fracture surgery   . Cardiac catheterization 11/16/09    SMOOTH AND NORMAL  . Total abdominal hysterectomy   . Laparoscopic cholecystectomy   . Bladder suspension     History  Smoking status  . Never Smoker   Smokeless tobacco  . Never Used    History  Alcohol Use No    Family History  Problem Relation Age of Onset  . Stroke Father   . Hypertension Father   . Atrial fibrillation Father     HAD MURMUR  . Heart failure Mother   . Hypertension Brother   . Hypertension Sister   . Hypertension Son     Reviw of Systems:  Reviewed in the HPI.  All other systems are negative.  Physical Exam: BP 151/75  Pulse 53  Ht 5' 8.5" (1.74 m)  Wt 224 lb 12.8  oz (101.969 kg)  BMI 33.68 kg/m2 The patient is alert and oriented x 3.  The mood and affect are normal.   Skin: warm and dry.  Color is normal.    HEENT:   Pupils carotids. She has no bruits. She is no JVD  Lungs: Her lungs are clear.   Heart: Irregularly irregular. Her heart rate is fairly slow.    Abdomen: The abdomen is benign. She is mildly obese.  Extremities:  Shows no clubbing cyanosis or edema.  Neuro:  In the exam is nonfocal.    ECG: Atrial fibrillation with a slow ventricular response. She has a pulmonary disease pattern. She has nonspecific ST and T-wave changes.   Assessment / Plan:

## 2011-03-04 ENCOUNTER — Other Ambulatory Visit: Payer: Self-pay | Admitting: *Deleted

## 2011-03-04 MED ORDER — METOPROLOL SUCCINATE ER 100 MG PO TB24: 150.0000 mg | ORAL_TABLET | Freq: Every day | ORAL | Status: DC

## 2011-03-05 ENCOUNTER — Other Ambulatory Visit: Payer: Self-pay | Admitting: *Deleted

## 2011-05-04 DIAGNOSIS — IMO0001 Reserved for inherently not codable concepts without codable children: Secondary | ICD-10-CM | POA: Diagnosis not present

## 2011-05-04 DIAGNOSIS — I4891 Unspecified atrial fibrillation: Secondary | ICD-10-CM | POA: Diagnosis not present

## 2011-05-04 DIAGNOSIS — I10 Essential (primary) hypertension: Secondary | ICD-10-CM | POA: Diagnosis not present

## 2011-05-26 ENCOUNTER — Other Ambulatory Visit: Payer: Self-pay | Admitting: *Deleted

## 2011-05-28 ENCOUNTER — Other Ambulatory Visit: Payer: Self-pay | Admitting: *Deleted

## 2011-06-22 ENCOUNTER — Other Ambulatory Visit: Payer: Self-pay | Admitting: *Deleted

## 2011-06-22 ENCOUNTER — Other Ambulatory Visit: Payer: Self-pay | Admitting: Cardiovascular Disease

## 2011-06-22 MED ORDER — RIVAROXABAN 20 MG PO TABS: 1.0000 | ORAL_TABLET | ORAL | Status: DC

## 2011-06-22 NOTE — Telephone Encounter (Signed)
Fax Received. Refill Completed. Zyden Suman Chowoe (R.M.A)   

## 2011-07-09 DIAGNOSIS — IMO0001 Reserved for inherently not codable concepts without codable children: Secondary | ICD-10-CM | POA: Diagnosis not present

## 2011-07-09 DIAGNOSIS — E785 Hyperlipidemia, unspecified: Secondary | ICD-10-CM | POA: Diagnosis not present

## 2011-07-09 DIAGNOSIS — R82998 Other abnormal findings in urine: Secondary | ICD-10-CM | POA: Diagnosis not present

## 2011-07-09 DIAGNOSIS — I4891 Unspecified atrial fibrillation: Secondary | ICD-10-CM | POA: Diagnosis not present

## 2011-07-09 DIAGNOSIS — I10 Essential (primary) hypertension: Secondary | ICD-10-CM | POA: Diagnosis not present

## 2011-07-09 DIAGNOSIS — R300 Dysuria: Secondary | ICD-10-CM | POA: Diagnosis not present

## 2011-07-09 DIAGNOSIS — M810 Age-related osteoporosis without current pathological fracture: Secondary | ICD-10-CM | POA: Diagnosis not present

## 2011-07-16 DIAGNOSIS — Z0000 Encounter for general adult medical examination without abnormal findings: Secondary | ICD-10-CM | POA: Diagnosis not present

## 2011-07-16 DIAGNOSIS — IMO0001 Reserved for inherently not codable concepts without codable children: Secondary | ICD-10-CM | POA: Diagnosis not present

## 2011-07-16 DIAGNOSIS — F411 Generalized anxiety disorder: Secondary | ICD-10-CM | POA: Diagnosis not present

## 2011-07-16 DIAGNOSIS — I10 Essential (primary) hypertension: Secondary | ICD-10-CM | POA: Diagnosis not present

## 2011-08-27 ENCOUNTER — Telehealth: Payer: Self-pay | Admitting: Cardiovascular Disease

## 2011-09-02 DIAGNOSIS — M899 Disorder of bone, unspecified: Secondary | ICD-10-CM | POA: Diagnosis not present

## 2011-09-23 DIAGNOSIS — R82998 Other abnormal findings in urine: Secondary | ICD-10-CM | POA: Diagnosis not present

## 2011-09-23 DIAGNOSIS — N3090 Cystitis, unspecified without hematuria: Secondary | ICD-10-CM | POA: Diagnosis not present

## 2011-10-22 ENCOUNTER — Other Ambulatory Visit: Payer: Self-pay | Admitting: Internal Medicine

## 2011-10-22 DIAGNOSIS — Z1231 Encounter for screening mammogram for malignant neoplasm of breast: Secondary | ICD-10-CM

## 2011-10-26 DIAGNOSIS — R82998 Other abnormal findings in urine: Secondary | ICD-10-CM | POA: Diagnosis not present

## 2011-10-26 DIAGNOSIS — N3090 Cystitis, unspecified without hematuria: Secondary | ICD-10-CM | POA: Diagnosis not present

## 2011-10-28 DIAGNOSIS — L439 Lichen planus, unspecified: Secondary | ICD-10-CM | POA: Diagnosis not present

## 2011-10-28 DIAGNOSIS — B373 Candidiasis of vulva and vagina: Secondary | ICD-10-CM | POA: Diagnosis not present

## 2011-10-28 DIAGNOSIS — B3731 Acute candidiasis of vulva and vagina: Secondary | ICD-10-CM | POA: Diagnosis not present

## 2011-10-28 DIAGNOSIS — K120 Recurrent oral aphthae: Secondary | ICD-10-CM | POA: Diagnosis not present

## 2011-11-09 ENCOUNTER — Ambulatory Visit: Payer: BC Managed Care – PPO

## 2011-11-16 DIAGNOSIS — B3731 Acute candidiasis of vulva and vagina: Secondary | ICD-10-CM | POA: Diagnosis not present

## 2011-11-16 DIAGNOSIS — B373 Candidiasis of vulva and vagina: Secondary | ICD-10-CM | POA: Diagnosis not present

## 2011-11-16 DIAGNOSIS — N760 Acute vaginitis: Secondary | ICD-10-CM | POA: Diagnosis not present

## 2011-11-25 DIAGNOSIS — L259 Unspecified contact dermatitis, unspecified cause: Secondary | ICD-10-CM | POA: Diagnosis not present

## 2011-11-25 DIAGNOSIS — L439 Lichen planus, unspecified: Secondary | ICD-10-CM | POA: Diagnosis not present

## 2011-12-01 DIAGNOSIS — N760 Acute vaginitis: Secondary | ICD-10-CM | POA: Diagnosis not present

## 2011-12-01 DIAGNOSIS — N952 Postmenopausal atrophic vaginitis: Secondary | ICD-10-CM | POA: Diagnosis not present

## 2011-12-10 DIAGNOSIS — E119 Type 2 diabetes mellitus without complications: Secondary | ICD-10-CM | POA: Diagnosis not present

## 2011-12-10 DIAGNOSIS — Z961 Presence of intraocular lens: Secondary | ICD-10-CM | POA: Diagnosis not present

## 2011-12-30 DIAGNOSIS — Z23 Encounter for immunization: Secondary | ICD-10-CM | POA: Diagnosis not present

## 2012-01-12 ENCOUNTER — Encounter: Payer: Self-pay | Admitting: Cardiovascular Disease

## 2012-01-12 ENCOUNTER — Other Ambulatory Visit: Payer: Self-pay | Admitting: *Deleted

## 2012-01-12 ENCOUNTER — Ambulatory Visit
Admission: RE | Admit: 2012-01-12 | Discharge: 2012-01-12 | Disposition: A | Discharge status: Home / Self Care | Payer: Medicare Other | Source: Ambulatory Visit | Attending: Internal Medicine | Admitting: Internal Medicine

## 2012-01-12 ENCOUNTER — Ambulatory Visit (INDEPENDENT_AMBULATORY_CARE_PROVIDER_SITE_OTHER): Payer: Medicare Other | Admitting: Cardiovascular Disease

## 2012-01-12 VITALS — BP 144/86 | HR 67 | Ht 68.5 in | Wt 232.8 lb

## 2012-01-12 DIAGNOSIS — I4891 Unspecified atrial fibrillation: Secondary | ICD-10-CM | POA: Diagnosis not present

## 2012-01-12 DIAGNOSIS — I10 Essential (primary) hypertension: Secondary | ICD-10-CM

## 2012-01-12 DIAGNOSIS — E119 Type 2 diabetes mellitus without complications: Secondary | ICD-10-CM | POA: Diagnosis not present

## 2012-01-12 DIAGNOSIS — Z1231 Encounter for screening mammogram for malignant neoplasm of breast: Secondary | ICD-10-CM

## 2012-01-12 MED ORDER — LOSARTAN POTASSIUM 50 MG PO TABS: 50.0000 mg | ORAL_TABLET | Freq: Every day | ORAL | Status: DC

## 2012-01-12 MED ORDER — RIVAROXABAN 20 MG PO TABS: 20.0000 mg | ORAL_TABLET | ORAL | Status: DC

## 2012-01-12 NOTE — Assessment & Plan Note (Signed)
[name] seems to be doing fairly well. She has rate controlled atrial fibrillation. She is tolerating her Xarelto fairly well.  We'll continue with her current medications. I'll see her again in 6 months.

## 2012-01-12 NOTE — Patient Instructions (Addendum)
Your physician wants you to follow-up in: 6 MONTHS  You will receive a reminder letter in the mail two months in advance. If you don't receive a letter, please call our office to schedule the follow-up appointment.  Your physician recommends that you return for a FASTING lipid profile: 6 MONTHS   Your physician has recommended you make the following change in your medication:   START LOSARTAN 50 MG DAILY FOR HIGH BLOOD PRESSURE FOLLOW UP WITH PRIMARY.

## 2012-01-12 NOTE — Assessment & Plan Note (Signed)
Her blood pressure is mildly elevated today. She also has diabetes mellitus. She reports having a reaction to an ACE inhibitor in the past-he thinks it may of been a cough. We will add losartan 50 mg a day. She will be seeing Dr. Dagmar Hait next month. We will ask him do a basic metabolic profile at that time. I'll see her in 6 months with a fasting lipid profile, basic metabolic profile, and hepatic profile at that time.

## 2012-01-12 NOTE — Progress Notes (Signed)
Kristine [name] Date of Birth  Jan 20, 1946 [facility] [address]    [place], [place]  [address] [phone]  Fax  [phone]   Problem list: 1. Atrial fibrillation 2. Diabetes mellitus  History of Present Illness:  Kristine [name] is a 66 year old female with a history of atrial fibrillation. She has done well since I last saw her.  She's not had any episodes of chest pain or shortness breath. Her blood pressure readings have been normal.  She is tolerating the Xarelto fairly well.  She has some occasional bleeding from the gums.  She enjoys not having to check her INR levels.     She has occasional palpitations and heart racing.  This typically can rest and these will resolve.  She is able to wak about 10 minutes at a time.    Current Outpatient Prescriptions on File Prior to Visit  Medication Sig Dispense Refill  . Cholecalciferol (VITAMIN D PO) Take by mouth once a week.        . citalopram (CELEXA) 20 MG tablet Take 20 mg by mouth daily.        Marland Kitchen diltiazem (DILACOR XR) 240 MG 24 hr capsule Take 240 mg by mouth daily.        . furosemide (LASIX) 40 MG tablet Take 40 mg by mouth daily.        . GuaiFENesin (MUCINEX PO) Take by mouth as needed.        . insulin glargine (LANTUS) 100 UNIT/ML injection Inject into the skin every morning.       . metoprolol (TOPROL XL) 100 MG 24 hr tablet Take 1.5 tablets (150 mg total) by mouth daily.  135 tablet  6  . zolpidem (AMBIEN) 10 MG tablet Take 10 mg by mouth at bedtime as needed.        . Rivaroxaban 20 MG TABS Take 1 tablet by mouth 1 day or 1 dose.  30 tablet  5    Allergies  Allergen Reactions  . Albuterol     REACTION: Tachycardia- AFib  . Bee Venom   . Penicillins     REACTION: flushing \\T \ hot    Past Medical History  Diagnosis Date  . Dyspnea on exertion   . Atrial fibrillation   . Morbid obesity   . Pneumonia   . Diabetes mellitus   . HTN (hypertension)   . Abnormal EKG   . Abnormal cardiovascular  function study     MYOVIEW SHOWING ANTERIOR WALL ATTENUATION  . High cholesterol   . Scoliosis   . Spinal stenosis   . Anxiety   . Osteoarthritis   . Osteoporosis   . Mild aortic sclerosis   . Degenerative disc disease   . Vitamin D deficiency     Past Surgical History  Procedure Date  . Finger fracture surgery   . Cardiac catheterization 11/16/09    SMOOTH AND NORMAL  . Total abdominal hysterectomy   . Laparoscopic cholecystectomy   . Bladder suspension     History  Smoking status  . Never Smoker   Smokeless tobacco  . Never Used    History  Alcohol Use No    Family History  Problem Relation Age of Onset  . Stroke Father   . Hypertension Father   . Atrial fibrillation Father     HAD MURMUR  . Heart failure Mother   . Hypertension Brother   . Hypertension Sister   . Hypertension Son  Reviw of Systems:  Reviewed in the HPI.  All other systems are negative.  Physical Exam: BP 144/86  Pulse 67  Ht 5' 8.5" (1.74 m)  Wt 232 lb 12.8 oz (105.597 kg)  BMI 34.88 kg/m2 The patient is alert and oriented x 3.  The mood and affect are normal.   Skin: warm and dry.  Color is normal.    HEENT:   Pupils carotids. She has no bruits. She is no JVD  Lungs: Her lungs are clear.   Heart: Irregularly irregular. Her heart rate is fairly slow.    Abdomen: The abdomen is benign. She is mildly obese.  Extremities:  Shows no clubbing cyanosis or edema.  Neuro:  In the exam is nonfocal.    ECG: Oct. 22, 2013-atrial fibrillation at 67 beats a minute. She has nonspecific ST and T wave abnormalities. Assessment / Plan:

## 2012-01-21 ENCOUNTER — Encounter: Payer: Self-pay | Admitting: Cardiology

## 2012-01-21 ENCOUNTER — Encounter: Payer: Self-pay | Admitting: Cardiovascular Disease

## 2012-01-25 DIAGNOSIS — R0602 Shortness of breath: Secondary | ICD-10-CM | POA: Diagnosis not present

## 2012-01-25 DIAGNOSIS — I4891 Unspecified atrial fibrillation: Secondary | ICD-10-CM | POA: Diagnosis not present

## 2012-01-25 DIAGNOSIS — I10 Essential (primary) hypertension: Secondary | ICD-10-CM | POA: Diagnosis not present

## 2012-01-25 DIAGNOSIS — IMO0001 Reserved for inherently not codable concepts without codable children: Secondary | ICD-10-CM | POA: Diagnosis not present

## 2012-01-28 DIAGNOSIS — R0602 Shortness of breath: Secondary | ICD-10-CM | POA: Diagnosis not present

## 2012-02-04 DIAGNOSIS — R0602 Shortness of breath: Secondary | ICD-10-CM | POA: Diagnosis not present

## 2012-02-04 DIAGNOSIS — IMO0001 Reserved for inherently not codable concepts without codable children: Secondary | ICD-10-CM | POA: Diagnosis not present

## 2012-02-04 DIAGNOSIS — L439 Lichen planus, unspecified: Secondary | ICD-10-CM | POA: Diagnosis not present

## 2012-02-04 DIAGNOSIS — J309 Allergic rhinitis, unspecified: Secondary | ICD-10-CM | POA: Diagnosis not present

## 2012-03-11 ENCOUNTER — Other Ambulatory Visit: Payer: Self-pay | Admitting: Cardiovascular Disease

## 2012-03-11 MED ORDER — METOPROLOL SUCCINATE ER 100 MG PO TB24: ORAL_TABLET | ORAL | Status: DC

## 2012-03-14 DIAGNOSIS — IMO0001 Reserved for inherently not codable concepts without codable children: Secondary | ICD-10-CM | POA: Diagnosis not present

## 2012-03-14 DIAGNOSIS — Z1331 Encounter for screening for depression: Secondary | ICD-10-CM | POA: Diagnosis not present

## 2012-03-14 DIAGNOSIS — I10 Essential (primary) hypertension: Secondary | ICD-10-CM | POA: Diagnosis not present

## 2012-03-14 DIAGNOSIS — R0602 Shortness of breath: Secondary | ICD-10-CM | POA: Diagnosis not present

## 2012-05-05 ENCOUNTER — Emergency Department (HOSPITAL_COMMUNITY)
Admission: EM | Admit: 2012-05-05 | Discharge: 2012-05-06 | Disposition: A | Discharge status: Home / Self Care | Payer: Medicare Other | Attending: Emergency Medicine | Admitting: Emergency Medicine

## 2012-05-05 DIAGNOSIS — Z8659 Personal history of other mental and behavioral disorders: Secondary | ICD-10-CM | POA: Insufficient documentation

## 2012-05-05 DIAGNOSIS — S0180XA Unspecified open wound of other part of head, initial encounter: Secondary | ICD-10-CM | POA: Insufficient documentation

## 2012-05-05 DIAGNOSIS — Z8709 Personal history of other diseases of the respiratory system: Secondary | ICD-10-CM | POA: Insufficient documentation

## 2012-05-05 DIAGNOSIS — W108XXA Fall (on) (from) other stairs and steps, initial encounter: Secondary | ICD-10-CM | POA: Insufficient documentation

## 2012-05-05 DIAGNOSIS — Z8739 Personal history of other diseases of the musculoskeletal system and connective tissue: Secondary | ICD-10-CM | POA: Insufficient documentation

## 2012-05-05 DIAGNOSIS — M25519 Pain in unspecified shoulder: Secondary | ICD-10-CM | POA: Diagnosis not present

## 2012-05-05 DIAGNOSIS — S46909A Unspecified injury of unspecified muscle, fascia and tendon at shoulder and upper arm level, unspecified arm, initial encounter: Secondary | ICD-10-CM | POA: Diagnosis not present

## 2012-05-05 DIAGNOSIS — I2510 Atherosclerotic heart disease of native coronary artery without angina pectoris: Secondary | ICD-10-CM | POA: Insufficient documentation

## 2012-05-05 DIAGNOSIS — S4980XA Other specified injuries of shoulder and upper arm, unspecified arm, initial encounter: Secondary | ICD-10-CM | POA: Diagnosis not present

## 2012-05-05 DIAGNOSIS — M810 Age-related osteoporosis without current pathological fracture: Secondary | ICD-10-CM | POA: Insufficient documentation

## 2012-05-05 DIAGNOSIS — W19XXXA Unspecified fall, initial encounter: Secondary | ICD-10-CM

## 2012-05-05 DIAGNOSIS — Z79899 Other long term (current) drug therapy: Secondary | ICD-10-CM | POA: Insufficient documentation

## 2012-05-05 DIAGNOSIS — Z794 Long term (current) use of insulin: Secondary | ICD-10-CM | POA: Insufficient documentation

## 2012-05-05 DIAGNOSIS — Z862 Personal history of diseases of the blood and blood-forming organs and certain disorders involving the immune mechanism: Secondary | ICD-10-CM | POA: Diagnosis not present

## 2012-05-05 DIAGNOSIS — E7800 Pure hypercholesterolemia, unspecified: Secondary | ICD-10-CM | POA: Diagnosis not present

## 2012-05-05 DIAGNOSIS — Z8679 Personal history of other diseases of the circulatory system: Secondary | ICD-10-CM | POA: Insufficient documentation

## 2012-05-05 DIAGNOSIS — Z8701 Personal history of pneumonia (recurrent): Secondary | ICD-10-CM | POA: Insufficient documentation

## 2012-05-05 DIAGNOSIS — E119 Type 2 diabetes mellitus without complications: Secondary | ICD-10-CM | POA: Insufficient documentation

## 2012-05-05 DIAGNOSIS — S01112S Laceration without foreign body of left eyelid and periocular area, sequela: Secondary | ICD-10-CM

## 2012-05-05 DIAGNOSIS — S0100XA Unspecified open wound of scalp, initial encounter: Secondary | ICD-10-CM | POA: Diagnosis not present

## 2012-05-05 DIAGNOSIS — Y9301 Activity, walking, marching and hiking: Secondary | ICD-10-CM | POA: Insufficient documentation

## 2012-05-05 DIAGNOSIS — S40019A Contusion of unspecified shoulder, initial encounter: Secondary | ICD-10-CM | POA: Diagnosis not present

## 2012-05-05 DIAGNOSIS — Z8639 Personal history of other endocrine, nutritional and metabolic disease: Secondary | ICD-10-CM | POA: Insufficient documentation

## 2012-05-05 DIAGNOSIS — T1490XA Injury, unspecified, initial encounter: Secondary | ICD-10-CM | POA: Diagnosis not present

## 2012-05-05 DIAGNOSIS — S0990XA Unspecified injury of head, initial encounter: Secondary | ICD-10-CM | POA: Insufficient documentation

## 2012-05-05 DIAGNOSIS — Y92009 Unspecified place in unspecified non-institutional (private) residence as the place of occurrence of the external cause: Secondary | ICD-10-CM | POA: Insufficient documentation

## 2012-05-05 DIAGNOSIS — I10 Essential (primary) hypertension: Secondary | ICD-10-CM | POA: Diagnosis not present

## 2012-05-05 NOTE — ED Provider Notes (Addendum)
History     CSN: [id number]  Arrival date & time 05/05/12  2254   First MD Initiated Contact with Patient 05/05/12 2302      Chief Complaint  Patient presents with  . Fall    (Consider location/radiation/quality/duration/timing/severity/associated sxs/prior treatment) Patient is a 67 y.o. female presenting with fall. The history is provided by the patient.  Fall The accident occurred 1 to 2 hours ago. The fall occurred while walking (slipped and fell down 2 carpeted steps). Distance fallen: 2 steps. She landed on carpet. The volume of blood lost was large. The point of impact was the left shoulder (face). The pain is present in the left shoulder (left eyebrow and coccyx). The pain is at a severity of 5/10. The pain is moderate. She was ambulatory at the scene. Pertinent negatives include no visual change, no numbness, no abdominal pain, no vomiting, no headaches, no loss of consciousness and no tingling. The symptoms are aggravated by pressure on the injury. She has tried nothing for the symptoms. The treatment provided no relief.    Past Medical History  Diagnosis Date  . Dyspnea on exertion   . Atrial fibrillation   . Morbid obesity   . Pneumonia   . Diabetes mellitus   . HTN (hypertension)   . Abnormal EKG   . Abnormal cardiovascular function study     MYOVIEW SHOWING ANTERIOR WALL ATTENUATION  . High cholesterol   . Scoliosis   . Spinal stenosis   . Anxiety   . Osteoarthritis   . Osteoporosis   . Mild aortic sclerosis   . Degenerative disc disease   . Vitamin D deficiency     Past Surgical History  Procedure Laterality Date  . Finger fracture surgery    . Cardiac catheterization  11/16/09    SMOOTH AND NORMAL  . Total abdominal hysterectomy    . Laparoscopic cholecystectomy    . Bladder suspension      Family History  Problem Relation Age of Onset  . Stroke Father   . Hypertension Father   . Atrial fibrillation Father     HAD MURMUR  . Heart failure Mother    . Hypertension Brother   . Hypertension Sister   . Hypertension Son     History  Substance Use Topics  . Smoking status: Never Smoker   . Smokeless tobacco: Never Used  . Alcohol Use: No    OB History   Grav Para Term Preterm Abortions TAB SAB Ect Mult Living                  Review of Systems  Gastrointestinal: Negative for vomiting and abdominal pain.  Neurological: Negative for tingling, loss of consciousness, numbness and headaches.  All other systems reviewed and are negative.    Allergies  Albuterol; Bee venom; and Penicillins  Home Medications   Current Outpatient Rx  Name  Route  Sig  Dispense  Refill  . Cholecalciferol (VITAMIN D PO)   Oral   Take by mouth once a week.           . citalopram (CELEXA) 20 MG tablet   Oral   Take 20 mg by mouth daily.           Marland Kitchen diltiazem (DILACOR XR) 240 MG 24 hr capsule   Oral   Take 240 mg by mouth daily.           . furosemide (LASIX) 40 MG tablet   Oral   Take  40 mg by mouth daily.           . GuaiFENesin (MUCINEX PO)   Oral   Take by mouth as needed.           . insulin glargine (LANTUS) 100 UNIT/ML injection   Subcutaneous   Inject into the skin every morning.          Marland Kitchen losartan (COZAAR) 50 MG tablet   Oral   Take 1 tablet (50 mg total) by mouth daily.   90 tablet   3   . metoprolol succinate (TOPROL XL) 100 MG 24 hr tablet      Take 1 & 1/2 tablets by mouth daily.   135 tablet   6   . Rivaroxaban (XARELTO) 20 MG TABS   Oral   Take 1 tablet (20 mg total) by mouth 1 day or 1 dose.   30 tablet   5     Discontinue pradaxa   . zolpidem (AMBIEN) 10 MG tablet   Oral   Take 10 mg by mouth at bedtime as needed.             BP 149/77  Pulse 77  Temp(Src) 97.7 F (36.5 C) (Oral)  Resp 20  SpO2 94%  Physical Exam  Nursing note and vitals reviewed. Constitutional: She is oriented to person, place, and time. She appears well-developed and well-nourished. No distress.  HENT:   Head: Normocephalic. Head is with laceration.    Right Ear: Tympanic membrane and ear canal normal.  Left Ear: Tympanic membrane and ear canal normal.  Mouth/Throat: Oropharynx is clear and moist and mucous membranes are normal.  Eyes: Conjunctivae and EOM are normal. Pupils are equal, round, and reactive to light.  Neck: Normal range of motion. Neck supple. No spinous process tenderness and no muscular tenderness present.  Cardiovascular: Normal rate, regular rhythm and intact distal pulses.   No murmur heard. Pulmonary/Chest: Effort normal and breath sounds normal. No respiratory distress. She has no wheezes. She has no rales.  Abdominal: Soft. She exhibits no distension. There is no tenderness. There is no rebound and no guarding.  Musculoskeletal: Normal range of motion. She exhibits tenderness. She exhibits no edema.       Left shoulder: She exhibits tenderness, bony tenderness and swelling. She exhibits normal range of motion.       Arms: Ecchymosis present with hematoma over the left humeral head. Range of motion of the elbow and wrist without tenderness. 2+ pulses in bilateral upper extremities.  tenderness to palpation over the coccyx no signs of ecchymosis or injury  Neurological: She is alert and oriented to person, place, and time.  Skin: Skin is warm and dry. No rash noted. No erythema.  Psychiatric: She has a normal mood and affect. Her behavior is normal.    ED Course  Procedures (including critical care time)  Labs Reviewed - No data to display Ct Head Wo Contrast  05/06/2012  *RADIOLOGY REPORT*  Clinical Data: Status post fall; left-sided facial pain. Laceration on right forehead, with bleeding.  CT HEAD WITHOUT CONTRAST  Technique:  Contiguous axial images were obtained from the base of the skull through the vertex without contrast.  Comparison: None.  Findings: There is no evidence of acute infarction, mass lesion, or intra- or extra-axial hemorrhage on CT.  Mild  periventricular white matter change may reflect small vessel ischemic microangiopathy.  The posterior fossa, including the cerebellum, brainstem and fourth ventricle, is within normal limits.  The  third and lateral ventricles, and basal ganglia are unremarkable in appearance.  The cerebral hemispheres are symmetric in appearance, with normal gray- white differentiation.  No mass effect or midline shift is seen.  There is no evidence of fracture; visualized osseous structures are unremarkable in appearance.  The orbits are within normal limits. The paranasal sinuses and mastoid air cells are well-aerated.  Soft tissue swelling is noted lateral to and overlying the left orbit.  IMPRESSION:  1.  No evidence of traumatic intracranial injury or fracture. 2.  Soft tissue swelling noted lateral to and overlying the left orbit. 3.  Mild small vessel ischemic microangiopathy.   Original Report Authenticated By: Santa Lighter, M.D.    Dg Shoulder Left  05/06/2012  *RADIOLOGY REPORT*  Clinical Data: Status post fall down two steps; left shoulder pain.  LEFT SHOULDER - 2+ VIEW  Comparison: None.  Findings: There is no evidence of fracture or dislocation.  The left humeral head is seated within the glenoid fossa.  Mild degenerative change is noted at the left acromioclavicular joint. No significant soft tissue abnormalities are seen.  Vascular congestion is noted.  IMPRESSION:  1.  No evidence of fracture or dislocation. 2.  Vascular congestion noted.   Original Report Authenticated By: Santa Lighter, M.D.    LACERATION REPAIR Performed by: Blanchie Dessert Authorized by: Blanchie Dessert Consent: Verbal consent obtained. Risks and benefits: risks, benefits and alternatives were discussed Consent given by: patient Patient identity confirmed: provided demographic data Prepped and Draped in normal sterile fashion Wound explored  Laceration Location: left eyebrow  Laceration Length: 2cm  No Foreign Bodies seen or  palpated  Anesthesia: local infiltration  Local anesthetic:none Irrigation method: syringe Amount of cleaning: standard  Skin closure: dermabond  Number of sutures: dermabond  Technique: dermabond  Patient tolerance: Patient tolerated the procedure well with no immediate complications.    1. Fall   2. Eyebrow laceration, left, sequela   3. Shoulder contusion       MDM   Patient with a mechanical fall tonight hitting her face, shoulder and coccyx after falling down 2 steps. Patient is on xarelto.  She denies headache, visual changes or weakness. She has normal mental status.  Tetanus shot is up-to-date. Wound on the face was repaired with Dermabond as its hemostatic.  CT of head, shoulder film pending  1:04 AM Films neg.      Blanchie Dessert, MD 05/06/12 [id number]  Blanchie Dessert, MD 05/06/12 0110

## 2012-05-05 NOTE — ED Notes (Signed)
Pt in from home via [facility], pt c/o fall tonight, pt landed L face & L shoulder on carpet floor, denies LOC, pt c/o L face pain & L shoulder, & coccyx pain, pt has 1/2 inch lac on R forehead with bleeding controlled, per EMS [place] & fire dept had to put her in their vehicle & brought the pt to EMS, per report the pt was ambulatory & was not placed in LSB & C collar upon arrival to ED, pt denies neck & back pain, pt A&O x4, follows commands, speaks in complete sentences

## 2012-05-06 ENCOUNTER — Emergency Department (HOSPITAL_COMMUNITY): Payer: Medicare Other

## 2012-05-06 ENCOUNTER — Encounter (HOSPITAL_COMMUNITY): Payer: Self-pay | Admitting: Radiology

## 2012-05-06 DIAGNOSIS — S0180XA Unspecified open wound of other part of head, initial encounter: Secondary | ICD-10-CM | POA: Diagnosis not present

## 2012-05-06 DIAGNOSIS — M25519 Pain in unspecified shoulder: Secondary | ICD-10-CM | POA: Diagnosis not present

## 2012-05-06 DIAGNOSIS — S46909A Unspecified injury of unspecified muscle, fascia and tendon at shoulder and upper arm level, unspecified arm, initial encounter: Secondary | ICD-10-CM | POA: Diagnosis not present

## 2012-05-06 DIAGNOSIS — S4980XA Other specified injuries of shoulder and upper arm, unspecified arm, initial encounter: Secondary | ICD-10-CM | POA: Diagnosis not present

## 2012-05-06 NOTE — ED Notes (Signed)
MD at bedside applying Dermabond 

## 2012-05-06 NOTE — ED Notes (Signed)
Pt back from scan 

## 2012-05-06 NOTE — ED Notes (Signed)
Patient transported to CT 

## 2012-06-13 DIAGNOSIS — M810 Age-related osteoporosis without current pathological fracture: Secondary | ICD-10-CM | POA: Diagnosis not present

## 2012-06-13 DIAGNOSIS — I4891 Unspecified atrial fibrillation: Secondary | ICD-10-CM | POA: Diagnosis not present

## 2012-06-13 DIAGNOSIS — E785 Hyperlipidemia, unspecified: Secondary | ICD-10-CM | POA: Diagnosis not present

## 2012-06-13 DIAGNOSIS — K219 Gastro-esophageal reflux disease without esophagitis: Secondary | ICD-10-CM | POA: Diagnosis not present

## 2012-06-13 DIAGNOSIS — IMO0002 Reserved for concepts with insufficient information to code with codable children: Secondary | ICD-10-CM | POA: Diagnosis not present

## 2012-06-13 DIAGNOSIS — IMO0001 Reserved for inherently not codable concepts without codable children: Secondary | ICD-10-CM | POA: Diagnosis not present

## 2012-06-13 DIAGNOSIS — I519 Heart disease, unspecified: Secondary | ICD-10-CM | POA: Diagnosis not present

## 2012-06-13 DIAGNOSIS — I10 Essential (primary) hypertension: Secondary | ICD-10-CM | POA: Diagnosis not present

## 2012-06-13 DIAGNOSIS — F411 Generalized anxiety disorder: Secondary | ICD-10-CM | POA: Diagnosis not present

## 2012-06-15 DIAGNOSIS — R269 Unspecified abnormalities of gait and mobility: Secondary | ICD-10-CM | POA: Diagnosis not present

## 2012-06-15 DIAGNOSIS — M2170 Unequal limb length (acquired), unspecified site: Secondary | ICD-10-CM | POA: Diagnosis not present

## 2012-07-12 ENCOUNTER — Other Ambulatory Visit: Payer: Self-pay | Admitting: *Deleted

## 2012-07-12 DIAGNOSIS — I10 Essential (primary) hypertension: Secondary | ICD-10-CM | POA: Diagnosis not present

## 2012-07-12 DIAGNOSIS — IMO0001 Reserved for inherently not codable concepts without codable children: Secondary | ICD-10-CM | POA: Diagnosis not present

## 2012-07-12 DIAGNOSIS — I4891 Unspecified atrial fibrillation: Secondary | ICD-10-CM | POA: Diagnosis not present

## 2012-07-12 MED ORDER — RIVAROXABAN 20 MG PO TABS: 20.0000 mg | ORAL_TABLET | Freq: Every morning | ORAL | Status: DC

## 2012-07-12 NOTE — Telephone Encounter (Signed)
Fax Received. Refill Completed. Kristine [name] (R.M.A)   

## 2012-07-18 ENCOUNTER — Other Ambulatory Visit: Payer: Self-pay

## 2012-07-18 DIAGNOSIS — Z1231 Encounter for screening mammogram for malignant neoplasm of breast: Secondary | ICD-10-CM

## 2012-07-27 DIAGNOSIS — E559 Vitamin D deficiency, unspecified: Secondary | ICD-10-CM | POA: Diagnosis not present

## 2012-07-27 DIAGNOSIS — M810 Age-related osteoporosis without current pathological fracture: Secondary | ICD-10-CM | POA: Diagnosis not present

## 2012-08-17 DIAGNOSIS — M79609 Pain in unspecified limb: Secondary | ICD-10-CM | POA: Diagnosis not present

## 2012-08-17 DIAGNOSIS — R269 Unspecified abnormalities of gait and mobility: Secondary | ICD-10-CM | POA: Diagnosis not present

## 2012-09-16 ENCOUNTER — Encounter: Payer: Self-pay | Admitting: Internal Medicine

## 2012-09-19 ENCOUNTER — Ambulatory Visit: Payer: Medicare Other | Admitting: Cardiovascular Disease

## 2012-11-01 ENCOUNTER — Encounter: Payer: Self-pay | Admitting: Cardiovascular Disease

## 2012-11-14 DIAGNOSIS — I10 Essential (primary) hypertension: Secondary | ICD-10-CM | POA: Diagnosis not present

## 2012-11-14 DIAGNOSIS — I4891 Unspecified atrial fibrillation: Secondary | ICD-10-CM | POA: Diagnosis not present

## 2012-11-14 DIAGNOSIS — Z6839 Body mass index (BMI) 39.0-39.9, adult: Secondary | ICD-10-CM | POA: Diagnosis not present

## 2012-11-14 DIAGNOSIS — T148 Other injury of unspecified body region: Secondary | ICD-10-CM | POA: Diagnosis not present

## 2012-11-14 DIAGNOSIS — E1169 Type 2 diabetes mellitus with other specified complication: Secondary | ICD-10-CM | POA: Diagnosis not present

## 2012-11-14 DIAGNOSIS — M810 Age-related osteoporosis without current pathological fracture: Secondary | ICD-10-CM | POA: Diagnosis not present

## 2012-11-14 DIAGNOSIS — F411 Generalized anxiety disorder: Secondary | ICD-10-CM | POA: Diagnosis not present

## 2012-12-12 DIAGNOSIS — R3915 Urgency of urination: Secondary | ICD-10-CM | POA: Diagnosis not present

## 2012-12-12 DIAGNOSIS — R109 Unspecified abdominal pain: Secondary | ICD-10-CM | POA: Diagnosis not present

## 2012-12-12 DIAGNOSIS — Z6833 Body mass index (BMI) 33.0-33.9, adult: Secondary | ICD-10-CM | POA: Diagnosis not present

## 2012-12-12 DIAGNOSIS — K219 Gastro-esophageal reflux disease without esophagitis: Secondary | ICD-10-CM | POA: Diagnosis not present

## 2013-01-12 ENCOUNTER — Ambulatory Visit
Admission: RE | Admit: 2013-01-12 | Discharge: 2013-01-12 | Disposition: A | Discharge status: Home / Self Care | Payer: Medicare Other | Source: Ambulatory Visit

## 2013-01-12 DIAGNOSIS — Z1231 Encounter for screening mammogram for malignant neoplasm of breast: Secondary | ICD-10-CM | POA: Diagnosis not present

## 2013-01-12 DIAGNOSIS — Z23 Encounter for immunization: Secondary | ICD-10-CM | POA: Diagnosis not present

## 2013-01-13 ENCOUNTER — Encounter: Payer: Self-pay | Admitting: Cardiovascular Disease

## 2013-01-13 ENCOUNTER — Ambulatory Visit (INDEPENDENT_AMBULATORY_CARE_PROVIDER_SITE_OTHER): Payer: Medicare Other | Admitting: Cardiovascular Disease

## 2013-01-13 VITALS — BP 183/88 | HR 70 | Ht 68.0 in | Wt 229.0 lb

## 2013-01-13 DIAGNOSIS — I4891 Unspecified atrial fibrillation: Secondary | ICD-10-CM

## 2013-01-13 DIAGNOSIS — Z79899 Other long term (current) drug therapy: Secondary | ICD-10-CM | POA: Diagnosis not present

## 2013-01-13 DIAGNOSIS — E119 Type 2 diabetes mellitus without complications: Secondary | ICD-10-CM

## 2013-01-13 DIAGNOSIS — I10 Essential (primary) hypertension: Secondary | ICD-10-CM

## 2013-01-13 LAB — BASIC METABOLIC PANEL
CO2: 32 mEq/L (ref 19–32)
Chloride: 100 mEq/L (ref 96–112)
Glucose, Bld: 115 mg/dL — ABNORMAL HIGH (ref 70–99)
Sodium: 139 mEq/L (ref 135–145)

## 2013-01-13 MED ORDER — LOSARTAN POTASSIUM 50 MG PO TABS: 50.0000 mg | ORAL_TABLET | Freq: Every day | ORAL | Status: DC

## 2013-01-13 NOTE — Assessment & Plan Note (Addendum)
Kristine [name] is doing ok..  She has been taking Digoxin for the past year or so - it is not on our med list.  We will check a Digoxin level as well as a BMP.  Her Hr is ok - although she is having some PVCs.  I will see her  In 6 months for OV

## 2013-01-13 NOTE — Progress Notes (Signed)
Kristine [name] Date of Birth  27-Apr-1945 [facility] [address]    [place], [place]  [address] [phone]  Fax  [phone]   Problem list: 1. Atrial fibrillation 2. Diabetes mellitus  History of Present Illness:  Kristine [name] is a 67 year old female with a history of atrial fibrillation. She has done well since I last saw her.  She's not had any episodes of chest pain or shortness breath. Her blood pressure readings have been normal.  She is tolerating the Xarelto fairly well.  She has some occasional bleeding from the gums.  She enjoys not having to check her INR levels.     She has occasional palpitations and heart racing.  This typically can rest and these will resolve.  She is able to wak about 10 minutes at a time.   Oct. 24, 2014:     Current Outpatient Prescriptions on File Prior to Visit  Medication Sig Dispense Refill  . citalopram (CELEXA) 20 MG tablet Take 20 mg by mouth daily.        Marland Kitchen diltiazem (DILACOR XR) 240 MG 24 hr capsule Take 240 mg by mouth daily.        . furosemide (LASIX) 40 MG tablet Take 40 mg by mouth daily.        . GuaiFENesin (MUCINEX PO) Take 1 tablet by mouth 2 (two) times daily as needed. For chest congestion      . insulin glargine (LANTUS) 100 UNIT/ML injection Inject 110 Units into the skin 2 (two) times daily. Takes 55 units in the morning and 55 units at night      . metFORMIN (GLUCOPHAGE) 500 MG tablet Take 500-1,000 mg by mouth 2 (two) times daily with a meal. Take 1000mg  in the morning and 500mg  in the evening      . metoprolol succinate (TOPROL-XL) 100 MG 24 hr tablet Take 150 mg by mouth every morning. Take with or immediately following a meal.      . Rivaroxaban (XARELTO) 20 MG TABS Take 1 tablet (20 mg total) by mouth every morning.  30 tablet  5  . Vitamin D, Ergocalciferol, (DRISDOL) 50000 UNITS CAPS Take 50,000 Units by mouth every 7 (seven) days. Takes on Thursday      . zolpidem (AMBIEN) 10 MG tablet Take 10  mg by mouth at bedtime as needed. For sleep       No current facility-administered medications on file prior to visit.    Allergies  Allergen Reactions  . Albuterol     REACTION: Tachycardia- AFib  . Bee Venom Other (See Comments)    "makes me nervous"  . Ivp Dye [Iodinated Diagnostic Agents] Other (See Comments)    flushing  . Penicillins     REACTION: flushing \\T \ hot    Past Medical History  Diagnosis Date  . Dyspnea on exertion   . Atrial fibrillation   . Morbid obesity   . Pneumonia   . Diabetes mellitus   . HTN (hypertension)   . Abnormal EKG   . Abnormal cardiovascular function study     MYOVIEW SHOWING ANTERIOR WALL ATTENUATION  . High cholesterol   . Scoliosis   . Spinal stenosis   . Anxiety   . Osteoarthritis   . Osteoporosis   . Mild aortic sclerosis   . Degenerative disc disease   . Vitamin D deficiency     Past Surgical History  Procedure Laterality Date  . Finger fracture surgery    .  Cardiac catheterization  11/16/09    SMOOTH AND NORMAL  . Total abdominal hysterectomy    . Laparoscopic cholecystectomy    . Bladder suspension      History  Smoking status  . Never Smoker   Smokeless tobacco  . Never Used    History  Alcohol Use No    Family History  Problem Relation Age of Onset  . Stroke Father   . Hypertension Father   . Atrial fibrillation Father     HAD MURMUR  . Heart failure Mother   . Hypertension Brother   . Hypertension Sister   . Hypertension Son     Reviw of Systems:  Reviewed in the HPI.  All other systems are negative.  Physical Exam: BP 183/88  Pulse 70  Ht 5\' 8"  (1.727 m)  Wt 229 lb (103.874 kg)  BMI 34.83 kg/m2 The patient is alert and oriented x 3.  The mood and affect are normal.   Skin: warm and dry.  Color is normal.    HEENT:   Pupils carotids. She has no bruits. She is no JVD  Lungs: Her lungs are clear.   Heart: Irregularly irregular. Her heart rate is fairly slow.    Abdomen: The abdomen is  benign. She is mildly obese.  Extremities:  Shows no clubbing cyanosis or edema.  Neuro:  In the exam is nonfocal.    ECG: Oct. 24, 2014-atrial fibrillation at 70 beats a minute. Frequent PVCs.   Assessment / Plan:

## 2013-01-13 NOTE — Patient Instructions (Signed)
Your physician recommends that you return for lab work in: today bmet digoxin level  Your physician recommends that you return for lab work in: 3-4 weeks  Your physician has recommended you make the following change in your medication: start losartan 50 mg once daily  Your physician wants you to follow-up in: 6 months  You will receive a reminder letter in the mail two months in advance. If you don't receive a letter, please call our office to schedule the follow-up appointment.

## 2013-01-13 NOTE — Assessment & Plan Note (Signed)
She has had an intolerance to lisinopril .  He have tried Losartan in the past but somehow it is not on her list.  Will restart  Losartan 50 mg a day. She will return in 3 weeks for BMP.  She is to call us if she does not tolerate this.

## 2013-01-14 LAB — DIGOXIN LEVEL: Digoxin Level: 1.4 ng/mL (ref 0.8–2.0)

## 2013-01-27 ENCOUNTER — Other Ambulatory Visit: Payer: Self-pay | Admitting: Cardiovascular Disease

## 2013-01-27 DIAGNOSIS — E785 Hyperlipidemia, unspecified: Secondary | ICD-10-CM | POA: Diagnosis not present

## 2013-01-27 DIAGNOSIS — E1169 Type 2 diabetes mellitus with other specified complication: Secondary | ICD-10-CM | POA: Diagnosis not present

## 2013-01-27 DIAGNOSIS — M810 Age-related osteoporosis without current pathological fracture: Secondary | ICD-10-CM | POA: Diagnosis not present

## 2013-02-03 ENCOUNTER — Other Ambulatory Visit (INDEPENDENT_AMBULATORY_CARE_PROVIDER_SITE_OTHER): Payer: Medicare Other

## 2013-02-03 DIAGNOSIS — I10 Essential (primary) hypertension: Secondary | ICD-10-CM | POA: Diagnosis not present

## 2013-02-03 DIAGNOSIS — E119 Type 2 diabetes mellitus without complications: Secondary | ICD-10-CM

## 2013-02-03 DIAGNOSIS — Z79899 Other long term (current) drug therapy: Secondary | ICD-10-CM | POA: Diagnosis not present

## 2013-02-03 LAB — BASIC METABOLIC PANEL
BUN: 12 mg/dL (ref 6–23)
Chloride: 99 mEq/L (ref 96–112)
Creatinine, Ser: 0.6 mg/dL (ref 0.4–1.2)
Glucose, Bld: 190 mg/dL — ABNORMAL HIGH (ref 70–99)
Potassium: 4 mEq/L (ref 3.5–5.1)

## 2013-02-10 DIAGNOSIS — I4891 Unspecified atrial fibrillation: Secondary | ICD-10-CM | POA: Diagnosis not present

## 2013-02-10 DIAGNOSIS — Z0000 Encounter for general adult medical examination without abnormal findings: Secondary | ICD-10-CM | POA: Diagnosis not present

## 2013-02-10 DIAGNOSIS — M810 Age-related osteoporosis without current pathological fracture: Secondary | ICD-10-CM | POA: Diagnosis not present

## 2013-02-10 DIAGNOSIS — E785 Hyperlipidemia, unspecified: Secondary | ICD-10-CM | POA: Diagnosis not present

## 2013-02-10 DIAGNOSIS — I10 Essential (primary) hypertension: Secondary | ICD-10-CM | POA: Diagnosis not present

## 2013-02-10 DIAGNOSIS — Z23 Encounter for immunization: Secondary | ICD-10-CM | POA: Diagnosis not present

## 2013-02-10 DIAGNOSIS — E1169 Type 2 diabetes mellitus with other specified complication: Secondary | ICD-10-CM | POA: Diagnosis not present

## 2013-02-10 DIAGNOSIS — I519 Heart disease, unspecified: Secondary | ICD-10-CM | POA: Diagnosis not present

## 2013-02-10 DIAGNOSIS — J309 Allergic rhinitis, unspecified: Secondary | ICD-10-CM | POA: Diagnosis not present

## 2013-04-06 DIAGNOSIS — E11329 Type 2 diabetes mellitus with mild nonproliferative diabetic retinopathy without macular edema: Secondary | ICD-10-CM | POA: Diagnosis not present

## 2013-04-06 DIAGNOSIS — E119 Type 2 diabetes mellitus without complications: Secondary | ICD-10-CM | POA: Diagnosis not present

## 2013-04-06 DIAGNOSIS — Z961 Presence of intraocular lens: Secondary | ICD-10-CM | POA: Diagnosis not present

## 2013-04-06 DIAGNOSIS — E1139 Type 2 diabetes mellitus with other diabetic ophthalmic complication: Secondary | ICD-10-CM | POA: Diagnosis not present

## 2013-04-06 DIAGNOSIS — H04129 Dry eye syndrome of unspecified lacrimal gland: Secondary | ICD-10-CM | POA: Diagnosis not present

## 2013-04-13 ENCOUNTER — Other Ambulatory Visit: Payer: Self-pay

## 2013-04-14 ENCOUNTER — Other Ambulatory Visit: Payer: Self-pay

## 2013-04-14 MED ORDER — METOPROLOL SUCCINATE ER 100 MG PO TB24: ORAL_TABLET | ORAL | Status: DC

## 2013-05-04 ENCOUNTER — Telehealth: Payer: Self-pay | Admitting: Cardiovascular Disease

## 2013-05-04 NOTE — Telephone Encounter (Signed)
New Message  Pt called. Requests a call back to discuss if her new medication (Losartan) is causing her to experience shortness of Breath// Please call back to discuss.

## 2013-05-04 NOTE — Telephone Encounter (Signed)
Left pt a message to call back. 

## 2013-05-10 DIAGNOSIS — I519 Heart disease, unspecified: Secondary | ICD-10-CM | POA: Diagnosis not present

## 2013-05-10 DIAGNOSIS — R109 Unspecified abdominal pain: Secondary | ICD-10-CM | POA: Diagnosis not present

## 2013-05-10 DIAGNOSIS — I4891 Unspecified atrial fibrillation: Secondary | ICD-10-CM | POA: Diagnosis not present

## 2013-05-10 DIAGNOSIS — K219 Gastro-esophageal reflux disease without esophagitis: Secondary | ICD-10-CM | POA: Diagnosis not present

## 2013-05-10 DIAGNOSIS — IMO0001 Reserved for inherently not codable concepts without codable children: Secondary | ICD-10-CM | POA: Diagnosis not present

## 2013-05-10 DIAGNOSIS — Z1331 Encounter for screening for depression: Secondary | ICD-10-CM | POA: Diagnosis not present

## 2013-05-10 DIAGNOSIS — I10 Essential (primary) hypertension: Secondary | ICD-10-CM | POA: Diagnosis not present

## 2013-05-10 DIAGNOSIS — E1169 Type 2 diabetes mellitus with other specified complication: Secondary | ICD-10-CM | POA: Diagnosis not present

## 2013-05-17 ENCOUNTER — Other Ambulatory Visit: Payer: Self-pay | Admitting: Internal Medicine

## 2013-05-17 DIAGNOSIS — R109 Unspecified abdominal pain: Secondary | ICD-10-CM

## 2013-05-17 DIAGNOSIS — D72828 Other elevated white blood cell count: Secondary | ICD-10-CM

## 2013-05-23 ENCOUNTER — Ambulatory Visit
Admission: RE | Admit: 2013-05-23 | Discharge: 2013-05-23 | Disposition: A | Discharge status: Home / Self Care | Payer: Medicare Other | Source: Ambulatory Visit | Attending: Internal Medicine | Admitting: Internal Medicine

## 2013-05-23 DIAGNOSIS — R109 Unspecified abdominal pain: Secondary | ICD-10-CM

## 2013-05-23 DIAGNOSIS — R609 Edema, unspecified: Secondary | ICD-10-CM | POA: Diagnosis not present

## 2013-05-23 DIAGNOSIS — D72828 Other elevated white blood cell count: Secondary | ICD-10-CM

## 2013-05-26 ENCOUNTER — Telehealth: Payer: Self-pay | Admitting: Cardiovascular Disease

## 2013-05-26 DIAGNOSIS — R609 Edema, unspecified: Secondary | ICD-10-CM

## 2013-05-26 DIAGNOSIS — R0989 Other specified symptoms and signs involving the circulatory and respiratory systems: Principal | ICD-10-CM

## 2013-05-26 DIAGNOSIS — R0609 Other forms of dyspnea: Secondary | ICD-10-CM

## 2013-05-26 NOTE — Telephone Encounter (Signed)
Pt saw pcp/ pt is having sob on exertion and fluid retention. Echo ordered with f/u ov. Pt agreed to plan.

## 2013-05-26 NOTE — Telephone Encounter (Signed)
New message          Pt pcp has suggested an echocardiogram. Is this ok?

## 2013-05-26 NOTE — Telephone Encounter (Signed)
Dr Acie Fredrickson is ok with pt having an echo/ is the pcp ordering it? If we are, we need to know what sx she is having, msg left for pt to call back.

## 2013-06-06 DIAGNOSIS — I4891 Unspecified atrial fibrillation: Secondary | ICD-10-CM | POA: Diagnosis not present

## 2013-06-06 DIAGNOSIS — J069 Acute upper respiratory infection, unspecified: Secondary | ICD-10-CM | POA: Diagnosis not present

## 2013-06-06 DIAGNOSIS — R059 Cough, unspecified: Secondary | ICD-10-CM | POA: Diagnosis not present

## 2013-06-06 DIAGNOSIS — E1169 Type 2 diabetes mellitus with other specified complication: Secondary | ICD-10-CM | POA: Diagnosis not present

## 2013-06-06 DIAGNOSIS — R05 Cough: Secondary | ICD-10-CM | POA: Diagnosis not present

## 2013-06-06 DIAGNOSIS — R609 Edema, unspecified: Secondary | ICD-10-CM | POA: Diagnosis not present

## 2013-06-06 DIAGNOSIS — Z6834 Body mass index (BMI) 34.0-34.9, adult: Secondary | ICD-10-CM | POA: Diagnosis not present

## 2013-06-14 ENCOUNTER — Encounter: Payer: Self-pay | Admitting: Cardiology

## 2013-06-14 ENCOUNTER — Ambulatory Visit (HOSPITAL_COMMUNITY): Payer: Medicare Other | Attending: Cardiology | Admitting: Cardiology

## 2013-06-14 DIAGNOSIS — I4891 Unspecified atrial fibrillation: Secondary | ICD-10-CM

## 2013-06-14 DIAGNOSIS — R0989 Other specified symptoms and signs involving the circulatory and respiratory systems: Principal | ICD-10-CM | POA: Insufficient documentation

## 2013-06-14 DIAGNOSIS — R0609 Other forms of dyspnea: Secondary | ICD-10-CM | POA: Insufficient documentation

## 2013-06-14 DIAGNOSIS — E8779 Other fluid overload: Secondary | ICD-10-CM | POA: Insufficient documentation

## 2013-06-14 DIAGNOSIS — R609 Edema, unspecified: Secondary | ICD-10-CM

## 2013-06-14 NOTE — Progress Notes (Signed)
Echo performed. 

## 2013-06-16 DIAGNOSIS — I519 Heart disease, unspecified: Secondary | ICD-10-CM | POA: Diagnosis not present

## 2013-06-16 DIAGNOSIS — I10 Essential (primary) hypertension: Secondary | ICD-10-CM | POA: Diagnosis not present

## 2013-06-16 DIAGNOSIS — E1169 Type 2 diabetes mellitus with other specified complication: Secondary | ICD-10-CM | POA: Diagnosis not present

## 2013-06-16 DIAGNOSIS — I4891 Unspecified atrial fibrillation: Secondary | ICD-10-CM | POA: Diagnosis not present

## 2013-06-16 DIAGNOSIS — J069 Acute upper respiratory infection, unspecified: Secondary | ICD-10-CM | POA: Diagnosis not present

## 2013-06-16 DIAGNOSIS — R609 Edema, unspecified: Secondary | ICD-10-CM | POA: Diagnosis not present

## 2013-06-16 DIAGNOSIS — Z6832 Body mass index (BMI) 32.0-32.9, adult: Secondary | ICD-10-CM | POA: Diagnosis not present

## 2013-06-22 ENCOUNTER — Telehealth: Payer: Self-pay | Admitting: *Deleted

## 2013-06-22 NOTE — Telephone Encounter (Signed)
Kristine [name] ','<More Detail >>       Kristine Headings, MD       Sent: Thu June 22, 2013 4:23 PM    To: Jonathon Jordan, RN                   Message     LV function is at the lower limit of normal or may be mildly depressed - 50 %. No change at this point. Will discuss at next office visit.        ----- Message -----    From: Jonathon Jordan, RN    Sent: 06/21/2013 4:30 PM    To: Kristine Headings, MD

## 2013-06-22 NOTE — Telephone Encounter (Signed)
Pt was called with echo results// pt has app tomorrow.

## 2013-06-23 ENCOUNTER — Encounter: Payer: Self-pay | Admitting: Cardiovascular Disease

## 2013-06-23 ENCOUNTER — Ambulatory Visit (INDEPENDENT_AMBULATORY_CARE_PROVIDER_SITE_OTHER): Payer: Medicare Other | Admitting: Cardiovascular Disease

## 2013-06-23 VITALS — BP 170/90 | HR 69 | Ht 68.5 in | Wt 225.0 lb

## 2013-06-23 DIAGNOSIS — I5042 Chronic combined systolic (congestive) and diastolic (congestive) heart failure: Secondary | ICD-10-CM

## 2013-06-23 DIAGNOSIS — I4891 Unspecified atrial fibrillation: Secondary | ICD-10-CM | POA: Diagnosis not present

## 2013-06-23 DIAGNOSIS — I5032 Chronic diastolic (congestive) heart failure: Secondary | ICD-10-CM | POA: Insufficient documentation

## 2013-06-23 DIAGNOSIS — I10 Essential (primary) hypertension: Secondary | ICD-10-CM | POA: Diagnosis not present

## 2013-06-23 DIAGNOSIS — Z79899 Other long term (current) drug therapy: Secondary | ICD-10-CM | POA: Diagnosis not present

## 2013-06-23 DIAGNOSIS — E119 Type 2 diabetes mellitus without complications: Secondary | ICD-10-CM

## 2013-06-23 DIAGNOSIS — I509 Heart failure, unspecified: Secondary | ICD-10-CM

## 2013-06-23 LAB — BASIC METABOLIC PANEL
BUN: 6 mg/dL (ref 6–23)
CO2: 31 meq/L (ref 19–32)
CREATININE: 0.66 mg/dL (ref 0.50–1.10)
Calcium: 9.4 mg/dL (ref 8.4–10.5)
Chloride: 97 mEq/L (ref 96–112)
GLUCOSE: 174 mg/dL — AB (ref 70–99)
Potassium: 4.1 mEq/L (ref 3.5–5.3)
SODIUM: 139 meq/L (ref 135–145)

## 2013-06-23 MED ORDER — FUROSEMIDE 40 MG PO TABS: 40.0000 mg | ORAL_TABLET | Freq: Every day | ORAL | Status: DC

## 2013-06-23 MED ORDER — DIGOXIN 125 MCG PO TABS: 125.0000 ug | ORAL_TABLET | Freq: Every day | ORAL | Status: DC

## 2013-06-23 MED ORDER — LOSARTAN POTASSIUM 100 MG PO TABS
100.0000 mg | ORAL_TABLET | Freq: Every day | ORAL | Status: DC
Start: 2013-06-23 — End: 2013-11-12

## 2013-06-23 MED ORDER — METOPROLOL SUCCINATE ER 200 MG PO TB24: 200.0000 mg | ORAL_TABLET | Freq: Every day | ORAL | Status: DC

## 2013-06-23 NOTE — Progress Notes (Signed)
Kristine [name] Date of Birth  [birth year] [facility] [address]    [place], [place]  [address] [phone]  Fax  [phone]   Problem list: 1. Atrial fibrillation 2. Diabetes mellitus  History of Present Illness:  Kristine [name] is a 68 year old female with a history of atrial fibrillation. Kristine [name] has done well since I last saw her.  Kristine [name]'s not had any episodes of chest pain or shortness breath. Her blood pressure readings have been normal.  Kristine [name] is tolerating the Xarelto fairly well.  Kristine [name] has some occasional bleeding from the gums.  Kristine [name] enjoys not having to check her INR levels.     Kristine [name] has occasional palpitations and heart racing.  This typically can rest and these will resolve.  Kristine [name] is able to wak about 10 minutes at a time.   Oct. 24, 2014:  June 23, 2013:   Kristine [name]'s BP is elevated.  Kristine [name] had a recent echocardiogram that revealed a left ventricle systolic function that was at the lower limits of normal-50%. Study Conclusions  - Left ventricle: The cavity size was normal. Wall thickness was increased in a pattern of mild LVH. The estimated ejection fraction was 50%. Diffuse hypokinesis. Features are consistent with a pseudonormal left ventricular filling pattern, with concomitant abnormal relaxation and increased filling pressure (grade 2 diastolic dysfunction). - Aortic valve: There was no stenosis. - Mitral valve: Mildly calcified annulus. Mildly calcified leaflets . Trivial regurgitation. - Left atrium: The atrium was moderately dilated. - Right ventricle: The cavity size was normal. Systolic function was mildly reduced. - Right atrium: The atrium was moderately dilated. - Systemic veins: IVC measured 1.7 cm with < 50% respirophasic variation, suggesting RA pressure 8 mmHg. - Pericardium, extracardiac: A trivial pericardial effusion was identified posterior to the heart  Kristine [name] was having some abdominal pain and   Current Outpatient Prescriptions on  File Prior to Visit  Medication Sig Dispense Refill  . citalopram (CELEXA) 20 MG tablet Take 20 mg by mouth daily.        . digoxin (LANOXIN) 0.25 MG tablet Take 0.25 mg by mouth daily.      Marland Kitchen diltiazem (DILACOR XR) 240 MG 24 hr capsule Take 240 mg by mouth daily.        . furosemide (LASIX) 40 MG tablet Take 80 mg by mouth daily.       . GuaiFENesin (MUCINEX PO) Take 1 tablet by mouth 2 (two) times daily as needed. For chest congestion      . insulin glargine (LANTUS) 100 UNIT/ML injection Inject 110 Units into the skin 2 (two) times daily. Takes 45 units in the morning and 35 units at night      . losartan (COZAAR) 50 MG tablet Take 1 tablet (50 mg total) by mouth daily.  90 tablet  1  . metFORMIN (GLUCOPHAGE) 500 MG tablet Take 500-1,000 mg by mouth 2 (two) times daily with a meal. Take 1000mg  in the morning and 500mg  in the evening      . metoprolol succinate (TOPROL-XL) 100 MG 24 hr tablet Take 1 &1/2 tablets daily, Take with or immediately following a meal.  45 tablet  6  . Vitamin D, Ergocalciferol, (DRISDOL) 50000 UNITS CAPS Take 50,000 Units by mouth every 7 (seven) days. Takes on Thursday      . XARELTO 20 MG TABS tablet TAKE 1 TABLET BY MOUTH EVERY MORNING  30 tablet  5  . zolpidem (AMBIEN) 10 MG tablet Take 10 mg by  mouth at bedtime as needed. For sleep       No current facility-administered medications on file prior to visit.    Allergies  Allergen Reactions  . Albuterol     REACTION: Tachycardia- AFib  . Bee Venom Other (See Comments)    "makes me nervous"  . Ivp Dye [Iodinated Diagnostic Agents] Other (See Comments)    flushing  . Penicillins     REACTION: flushing \\T \ hot    Past Medical History  Diagnosis Date  . Dyspnea on exertion   . Atrial fibrillation   . Morbid obesity   . Pneumonia   . Diabetes mellitus   . HTN (hypertension)   . Abnormal EKG   . Abnormal cardiovascular function study     MYOVIEW SHOWING ANTERIOR WALL ATTENUATION  . High cholesterol    . Scoliosis   . Spinal stenosis   . Anxiety   . Osteoarthritis   . Osteoporosis   . Mild aortic sclerosis   . Degenerative disc disease   . Vitamin D deficiency     Past Surgical History  Procedure Laterality Date  . Finger fracture surgery    . Cardiac catheterization  11/16/09    SMOOTH AND NORMAL  . Total abdominal hysterectomy    . Laparoscopic cholecystectomy    . Bladder suspension      History  Smoking status  . Never Smoker   Smokeless tobacco  . Never Used    History  Alcohol Use No    Family History  Problem Relation Age of Onset  . Stroke Father   . Hypertension Father   . Atrial fibrillation Father     HAD MURMUR  . Heart failure Mother   . Hypertension Brother   . Hypertension Sister   . Hypertension Son     Reviw of Systems:  Reviewed in the HPI.  All other systems are negative.  Physical Exam: BP 170/90  Pulse 69  Ht 5' 8.5" (1.74 m)  Wt 225 lb (102.059 kg)  BMI 33.71 kg/m2 The patient is alert and oriented x 3.  The mood and affect are normal.   Skin: warm and dry.  Color is normal.    HEENT:   Pupils carotids. Kristine [name] has no bruits. Kristine [name] is no JVD  Lungs: Her lungs are clear.   Heart: Irregularly irregular. Her heart rate is fairly slow.    Abdomen: The abdomen is benign. Kristine [name] is mildly obese.  Extremities:  Shows no clubbing cyanosis or edema.  Neuro:  In the exam is nonfocal.    ECG: Oct. 24, 2014-atrial fibrillation at 70 beats a minute. Frequent PVCs.   Assessment / Plan:

## 2013-06-23 NOTE — Assessment & Plan Note (Signed)
Kristine [name] was recently diagnosed as having congestive heart failure. Her left ventricle systolic function was mildly reduced at 50%.  Because of this, we will discontinue her diltiazem. We will increase the Toprol and also increase her losartan.  She's diuresed about 25 pounds over the past several months. I don't think that she needs this high dose of Lasix. We'll decrease her Lasix to 40 mg a day.  We'll check a basic metabolic profile today.

## 2013-06-23 NOTE — Patient Instructions (Addendum)
Your physician has recommended you make the following change in your medication:  INCREASE LOSARTAN TO 100 MG DAILY REDUCE LASIX TO 40 MG DAILY INCREASE TOPROL XL TO 200 MG DAILY STOP DILTIAZEM REDUCE DIGOXIN TO 0.125 MG DAILY  Your physician recommends that you schedule a follow-up appointment in: [place] [facility]  Your physician recommends that you return for lab work in: [place] 3 MONTHS

## 2013-06-23 NOTE — Assessment & Plan Note (Signed)
Her atrial fibrillation is fairly well-controlled but because her left ventricular systolic function is mildly to the past, we will need to discontinue the diltiazem. We will discontinue the diltiazem and increased her metoprolol XL to 200 mg a day.  We will also decrease her digoxin dose slightly to 0.25 mg a day. Hopefully this will continue to keep her rate well controlled.

## 2013-07-10 ENCOUNTER — Other Ambulatory Visit: Payer: Medicare Other

## 2013-07-11 ENCOUNTER — Telehealth: Payer: Self-pay

## 2013-07-11 NOTE — Telephone Encounter (Signed)
Patient called about a refill on her losartan, I called the pharm and they said that she could not get her rx refill on 6.10.15 and the patient would have to pay out of pocket for it. I called patient to explain what the pharm said and she understand

## 2013-07-13 ENCOUNTER — Ambulatory Visit: Payer: Medicare Other | Admitting: Cardiovascular Disease

## 2013-07-18 DIAGNOSIS — I4891 Unspecified atrial fibrillation: Secondary | ICD-10-CM | POA: Diagnosis not present

## 2013-07-18 DIAGNOSIS — R609 Edema, unspecified: Secondary | ICD-10-CM | POA: Diagnosis not present

## 2013-07-18 DIAGNOSIS — E119 Type 2 diabetes mellitus without complications: Secondary | ICD-10-CM | POA: Diagnosis not present

## 2013-07-18 DIAGNOSIS — Z6834 Body mass index (BMI) 34.0-34.9, adult: Secondary | ICD-10-CM | POA: Diagnosis not present

## 2013-07-18 DIAGNOSIS — IMO0001 Reserved for inherently not codable concepts without codable children: Secondary | ICD-10-CM | POA: Diagnosis not present

## 2013-08-01 ENCOUNTER — Other Ambulatory Visit: Payer: Self-pay | Admitting: Cardiovascular Disease

## 2013-09-01 DIAGNOSIS — IMO0001 Reserved for inherently not codable concepts without codable children: Secondary | ICD-10-CM | POA: Diagnosis not present

## 2013-09-01 DIAGNOSIS — R809 Proteinuria, unspecified: Secondary | ICD-10-CM | POA: Diagnosis not present

## 2013-09-01 DIAGNOSIS — I519 Heart disease, unspecified: Secondary | ICD-10-CM | POA: Diagnosis not present

## 2013-09-01 DIAGNOSIS — R0602 Shortness of breath: Secondary | ICD-10-CM | POA: Diagnosis not present

## 2013-09-01 DIAGNOSIS — I4891 Unspecified atrial fibrillation: Secondary | ICD-10-CM | POA: Diagnosis not present

## 2013-09-01 DIAGNOSIS — R609 Edema, unspecified: Secondary | ICD-10-CM | POA: Diagnosis not present

## 2013-09-01 DIAGNOSIS — Z6835 Body mass index (BMI) 35.0-35.9, adult: Secondary | ICD-10-CM | POA: Diagnosis not present

## 2013-09-15 DIAGNOSIS — Z6831 Body mass index (BMI) 31.0-31.9, adult: Secondary | ICD-10-CM | POA: Diagnosis not present

## 2013-09-15 DIAGNOSIS — R0602 Shortness of breath: Secondary | ICD-10-CM | POA: Diagnosis not present

## 2013-09-15 DIAGNOSIS — R609 Edema, unspecified: Secondary | ICD-10-CM | POA: Diagnosis not present

## 2013-09-15 DIAGNOSIS — IMO0001 Reserved for inherently not codable concepts without codable children: Secondary | ICD-10-CM | POA: Diagnosis not present

## 2013-09-18 ENCOUNTER — Other Ambulatory Visit: Payer: Self-pay | Admitting: Internal Medicine

## 2013-09-18 DIAGNOSIS — Z1231 Encounter for screening mammogram for malignant neoplasm of breast: Secondary | ICD-10-CM

## 2013-09-29 ENCOUNTER — Encounter: Payer: Self-pay | Admitting: Internal Medicine

## 2013-09-29 ENCOUNTER — Encounter: Payer: Self-pay | Admitting: Cardiovascular Disease

## 2013-09-29 ENCOUNTER — Ambulatory Visit (INDEPENDENT_AMBULATORY_CARE_PROVIDER_SITE_OTHER): Payer: Medicare Other | Admitting: Cardiovascular Disease

## 2013-09-29 VITALS — BP 160/100 | HR 82 | Ht 68.5 in | Wt 217.6 lb

## 2013-09-29 DIAGNOSIS — Z713 Dietary counseling and surveillance: Secondary | ICD-10-CM

## 2013-09-29 DIAGNOSIS — I5042 Chronic combined systolic (congestive) and diastolic (congestive) heart failure: Secondary | ICD-10-CM

## 2013-09-29 DIAGNOSIS — I509 Heart failure, unspecified: Secondary | ICD-10-CM | POA: Diagnosis not present

## 2013-09-29 NOTE — Assessment & Plan Note (Signed)
Kristine [name] continues to have episodes of diastolic dysfunction. I suspect that she is eating too much salt. She does have LVH. She's on a regimen of weighing herself every day and taking extra Lasix and occasional metolazone and she came symmetrically. Advised her to watch her weight more carefully and start her extra diuretics sooner rather than later.  We discussed starting her on an antiarrhythmic which hopefully will help her stay in sinus rhythm. Will have her see electrophysiology for further recommendations regarding that.

## 2013-09-29 NOTE — Assessment & Plan Note (Signed)
Kristine [name]  seems to be doing better.    She  has had 2 episodes of severe diastolic dysfunction. We talked about repeating her cardioversion but her last one was unsuccessful. Fairly sure that she'll need to be started on antiarrhythmic prior to that a successful coronary version. We discussed starting her on amiodarone versus Tikosyn.  I would like for her to see EP to help [place] decide which med would be best for her.  She has LVH and diastolic dysfunction so i think we should avoid Flecainide.  I will see her back in 6 months.

## 2013-09-29 NOTE — Progress Notes (Signed)
Kristine [name] Date of Birth  May 04, 1945 [facility] [address]    [place], [place]  [address] [phone]  Fax  [phone]   Problem list: 1. Atrial fibrillation 2. Diabetes mellitus  History of Present Illness:  Kristine [name] is a 68 year old female with a history of atrial fibrillation. She has done well since I last saw her.  She's not had any episodes of chest pain or shortness breath. Her blood pressure readings have been normal.  She is tolerating the Xarelto fairly well.  She has some occasional bleeding from the gums.  She enjoys not having to check her INR levels.     She has occasional palpitations and heart racing.  This typically can rest and these will resolve.  She is able to wak about 10 minutes at a time.   Oct. 24, 2014:  June 23, 2013:   Kristine [name]'s BP is elevated.  She had a recent echocardiogram that revealed a left ventricle systolic function that was at the lower limits of normal-50%. Study Conclusions  - Left ventricle: The cavity size was normal. Wall thickness was increased in a pattern of mild LVH. The estimated ejection fraction was 50%. Diffuse hypokinesis. Features are consistent with a pseudonormal left ventricular filling pattern, with concomitant abnormal relaxation and increased filling pressure (grade 2 diastolic dysfunction). - Aortic valve: There was no stenosis. - Mitral valve: Mildly calcified annulus. Mildly calcified leaflets . Trivial regurgitation. - Left atrium: The atrium was moderately dilated. - Right ventricle: The cavity size was normal. Systolic function was mildly reduced. - Right atrium: The atrium was moderately dilated. - Systemic veins: IVC measured 1.7 cm with < 50% respirophasic variation, suggesting RA pressure 8 mmHg. - Pericardium, extracardiac: A trivial pericardial effusion was identified posterior to the heart  She was having some abdominal pain and   September 29, 2013:  Kristine [name] is doing  well.  Stressed about coming here.   Current Outpatient Prescriptions on File Prior to Visit  Medication Sig Dispense Refill  . citalopram (CELEXA) 20 MG tablet Take 20 mg by mouth daily.        . digoxin (LANOXIN) 0.125 MG tablet Take 1 tablet (125 mcg total) by mouth daily.  90 tablet  1  . esomeprazole (NEXIUM) 40 MG capsule Take 40 mg by mouth as needed.      . GuaiFENesin (MUCINEX PO) Take 1 tablet by mouth 2 (two) times daily as needed. For chest congestion      . insulin glargine (LANTUS) 100 UNIT/ML injection Inject 110 Units into the skin 2 (two) times daily. Takes 45 units in the morning and 35 units at night      . losartan (COZAAR) 100 MG tablet Take 1 tablet (100 mg total) by mouth daily.  90 tablet  1  . metFORMIN (GLUCOPHAGE) 500 MG tablet Take 500-1,000 mg by mouth 2 (two) times daily with a meal. Take 1000mg  in the morning and 500mg  in the evening      . Vitamin D, Ergocalciferol, (DRISDOL) 50000 UNITS CAPS Take 50,000 Units by mouth every 7 (seven) days. Takes on Thursday      . XARELTO 20 MG TABS tablet TAKE 1 TABLET BY MOUTH EVERY MORNING  30 tablet  5  . zolpidem (AMBIEN) 10 MG tablet Take 10 mg by mouth at bedtime as needed. For sleep       No current facility-administered medications on file prior to visit.    Allergies  Allergen Reactions  .  Albuterol     REACTION: Tachycardia- AFib  . Bee Venom Other (See Comments)    "makes me nervous"  . Ivp Dye [Iodinated Diagnostic Agents] Other (See Comments)    flushing  . Penicillins     REACTION: flushing \\T \ hot    Past Medical History  Diagnosis Date  . Dyspnea on exertion   . Atrial fibrillation   . Morbid obesity   . Pneumonia   . Diabetes mellitus   . HTN (hypertension)   . Abnormal EKG   . Abnormal cardiovascular function study     MYOVIEW SHOWING ANTERIOR WALL ATTENUATION  . High cholesterol   . Scoliosis   . Spinal stenosis   . Anxiety   . Osteoarthritis   . Osteoporosis   . Mild aortic sclerosis    . Degenerative disc disease   . Vitamin D deficiency     Past Surgical History  Procedure Laterality Date  . Finger fracture surgery    . Cardiac catheterization  11/16/09    SMOOTH AND NORMAL  . Total abdominal hysterectomy    . Laparoscopic cholecystectomy    . Bladder suspension      History  Smoking status  . Never Smoker   Smokeless tobacco  . Never Used    History  Alcohol Use No    Family History  Problem Relation Age of Onset  . Stroke Father   . Hypertension Father   . Atrial fibrillation Father     HAD MURMUR  . Heart failure Mother   . Hypertension Brother   . Hypertension Sister   . Hypertension Son     Reviw of Systems:  Reviewed in the HPI.  All other systems are negative.  Physical Exam: BP 160/100  Pulse 82  Ht 5' 8.5" (1.74 m)  Wt 217 lb 9.6 oz (98.703 kg)  BMI 32.60 kg/m2 The patient is alert and oriented x 3.  The mood and affect are normal.   Skin: warm and dry.  Color is normal.    HEENT:   Pupils carotids. She has no bruits. She is no JVD  Lungs: Her lungs are clear.   Heart: Irregularly irregular. Her heart rate is fairly slow.    Abdomen: The abdomen is benign. She is mildly obese.  Extremities:  Shows no clubbing cyanosis or edema.  Neuro:  In the exam is nonfocal.    ECG:    Assessment / Plan:

## 2013-09-29 NOTE — Patient Instructions (Addendum)
You have been referred to [facility] and [facility] Cardiologists Rayann Heman, Lovena Le or Caryl Comes) for consideration of treatment with  [place]  Your physician recommends that you continue on your current medications as directed. Please refer to the Current Medication list given to you today.   Your physician wants you to follow-up in: 6 months with Dr. Acie Fredrickson.  You will receive a reminder letter in the mail two months in advance. If you don't receive a letter, please call our office to schedule the follow-up appointment.

## 2013-10-02 ENCOUNTER — Telehealth: Payer: Self-pay | Admitting: Cardiovascular Disease

## 2013-10-02 NOTE — Telephone Encounter (Signed)
10-02-13  Called pt and informed her of her appointment with Dr. Rayann Heman on 10-13-13 at 8:45 a.m.

## 2013-10-05 NOTE — Telephone Encounter (Signed)
Close encounter 

## 2013-10-11 ENCOUNTER — Encounter: Payer: Self-pay | Admitting: Dietician

## 2013-10-11 ENCOUNTER — Encounter: Payer: Medicare Other | Attending: Cardiovascular Disease | Admitting: Dietician

## 2013-10-11 VITALS — Ht 69.0 in | Wt 219.9 lb

## 2013-10-11 DIAGNOSIS — I5042 Chronic combined systolic (congestive) and diastolic (congestive) heart failure: Secondary | ICD-10-CM | POA: Insufficient documentation

## 2013-10-11 DIAGNOSIS — Z713 Dietary counseling and surveillance: Secondary | ICD-10-CM | POA: Diagnosis not present

## 2013-10-11 DIAGNOSIS — Z6832 Body mass index (BMI) 32.0-32.9, adult: Secondary | ICD-10-CM | POA: Insufficient documentation

## 2013-10-11 DIAGNOSIS — E119 Type 2 diabetes mellitus without complications: Secondary | ICD-10-CM | POA: Diagnosis not present

## 2013-10-11 DIAGNOSIS — E669 Obesity, unspecified: Secondary | ICD-10-CM | POA: Insufficient documentation

## 2013-10-11 DIAGNOSIS — Z794 Long term (current) use of insulin: Secondary | ICD-10-CM | POA: Diagnosis not present

## 2013-10-11 DIAGNOSIS — I509 Heart failure, unspecified: Secondary | ICD-10-CM | POA: Diagnosis not present

## 2013-10-11 NOTE — Progress Notes (Signed)
Medical Nutrition Therapy:  Appt start time: 0745 end time:  0845.  Assessment:  Primary concerns today: Kristine [name] is here today since she has CHF and had 31 lbs of extra fluid removed in the last few weeks after they increased Lasix. Has been holding onto water a lot since April. Gains about 5 lbs every few days in between taking diuretics. Working on limiting salt. Abdominal fluid is painful on her ribs at times.   Kristine [name] is also concerned that her blood sugar is going up. Hgb 8.2% 2 months ago per patient which is elevated from 7.6%.  Kristine [name] is retired and lives with her husband and states that they share the food shopping and meal preparation. Tends to eat more at night and might sleep in. Does not typically skip meals. Has about 5 restaurant meals per week.  In the past year starting sharing plates with husband at restaurants. Also started lowering fat in milk, lowering carbohydrates, drinking water.  Does not have a fluid restriction but tries not to drink too. Has been told to read label but does not have a specific sodium limit that she knows about. Does not use a salt shaker.   Has shortness of breath but trying to do activity 10 minutes 3 x day. Has done water aerobics and belongs to a pool.   Preferred Learning Style:   No preference indicated   Learning Readiness:   Ready  MEDICATIONS: Lasix, Xarelto   DIETARY INTAKE:  24-hr recall:  B ( AM): eggs with cooking spray and butter and 2 pieces of toast with butter with milk or coffee Snk ( AM): none  L ( PM): tomato sandwich with mayonnaise and bread Snk ( PM): maybe another tomato sandwich (sometimes) D ( PM): meatloaf with vegetables (bag of mixed vegetables) and red potato; at restaurant hamburger steak, fries, shrimp, hush puppies, tuna/chicken salad or chef salad  Snk ( PM): peanut butter and crackers, cereal, 1/2 sandwich, snack bars, ice cream (late at night) Beverages: tea with sugar or splenda, skim-2% milk, black  coffee  Usual physical activity: walks or plays ball with grandchildren for 10 minutes 3 x day   Estimated energy needs: 1600 calories 180 g carbohydrates 120 g protein 44 g fat  Progress Towards Goal(s):  In progress.   Nutritional Diagnosis:  NI-5.10.2 Excessive mineral intake (specify): sodium As related to related fluid retention from congestive heart failure.  As evidenced by diet recall of frequent high sodium meals.  Nutritional Diagnosis:  [place]-2.1 Inpaired nutrition utilization As related to glucose metabolism.  As evidenced by Hgb A1c of 8.2%.    Intervention:  Nutrition counseling provided. Will be following up in 4 weeks to spend more time on strategies to manage diabetes with diet.   Plan: Start with a goal of eating out no more than 3 x week. When you have achieved that goal, try no more than 2 x week, then 1 x week. Aim to have about 300-400 mg of sodium per meal (when you are able to look at food label).  Try to limit condiments, sauces, gravies especially in restaurants. Choose more "plain" items. At home, try oil and vinegar (or lemon) to go on salads. Look for no/low sodium peanut butter and crackers. Portion out snack items instead of eating out of a container. If you need to add flavor, Mrs. Dash or any herbs or spices (salt free).  Continue getting 30 minutes of activity most days, increase if tolerated. (Try to get back in  the pool!)  Teaching Method Utilized:  Visual Auditory Hands on  Handouts given during visit include:  Low Sodium Diet from eatright.org  Nutrition facts (food label)  Barriers to learning/adherence to lifestyle change: none  Demonstrated degree of understanding via:  Teach Back   Monitoring/Evaluation:  Dietary intake, exercise, and body weight in 4 week(s).

## 2013-10-11 NOTE — Patient Instructions (Signed)
Start with a goal of eating out no more than 3 x week. When you have achieved that goal, try no more than 2 x week, then 1 x week. Aim to have about 300-400 mg of sodium per meal (when you are able to look at food label).  Try to limit condiments, sauces, gravies especially in restaurants. Choose more "plain" items. At home, try oil and vinegar (or lemon) to go on salads. Look for no/low sodium peanut butter and crackers. Portion out snack items instead of eating out of a container. If you need to add flavor, Mrs. Dash or any herbs or spices (salt free).  Continue getting 30 minutes of activity most days, increase if tolerated. (Try to get back in the pool!)

## 2013-10-13 ENCOUNTER — Ambulatory Visit (INDEPENDENT_AMBULATORY_CARE_PROVIDER_SITE_OTHER): Payer: Medicare Other | Admitting: Internal Medicine

## 2013-10-13 ENCOUNTER — Encounter: Payer: Self-pay | Admitting: Internal Medicine

## 2013-10-13 VITALS — BP 147/93 | HR 90 | Ht 69.0 in | Wt 208.0 lb

## 2013-10-13 DIAGNOSIS — I110 Hypertensive heart disease with heart failure: Secondary | ICD-10-CM | POA: Diagnosis not present

## 2013-10-13 DIAGNOSIS — R0609 Other forms of dyspnea: Secondary | ICD-10-CM | POA: Diagnosis not present

## 2013-10-13 DIAGNOSIS — I5032 Chronic diastolic (congestive) heart failure: Secondary | ICD-10-CM | POA: Diagnosis not present

## 2013-10-13 DIAGNOSIS — R0989 Other specified symptoms and signs involving the circulatory and respiratory systems: Secondary | ICD-10-CM

## 2013-10-13 DIAGNOSIS — I509 Heart failure, unspecified: Secondary | ICD-10-CM | POA: Diagnosis not present

## 2013-10-13 DIAGNOSIS — I4891 Unspecified atrial fibrillation: Secondary | ICD-10-CM

## 2013-10-13 DIAGNOSIS — E669 Obesity, unspecified: Secondary | ICD-10-CM

## 2013-10-13 NOTE — Progress Notes (Signed)
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 She reports being in good health until 2011 when she developed pneumonia.  She says that while receiving a breathing treatment that she developed rapid irregular palpitations.  She has been in atrial fibrillation since that time.  An attempt at cardioversion was made about 3 years ago which was unsuccessful.  She has been treated with rate control since that time. She had difficulty with diastolic dysfunction which is felt to be due to her afib.  She has rare palpitations but is frequently "aware of her heart".  Her energy is "fair".  She has dypsnea with moderate activity.  She feels that abdominal distension is her primary symptom with CHF.   Today, she denies symptoms of chest pain, orthopnea, PND, lower extremity edema, dizziness, presyncope, syncope, or neurologic sequela. The patient is tolerating medications without difficulties and is otherwise without complaint today.   Past Medical History  Diagnosis Date  . Persistent atrial fibrillation   . Morbid obesity   . Pneumonia 2011  . Diabetes mellitus   . HTN (hypertension)   . Abnormal cardiovascular function study     MYOVIEW SHOWING ANTERIOR WALL ATTENUATION  . High cholesterol   . Scoliosis   . Spinal stenosis   . Anxiety   . Osteoarthritis   . Osteoporosis   . Mild aortic sclerosis   . Degenerative disc disease   . Vitamin D deficiency    Past Surgical History  Procedure Laterality Date  . Finger fracture surgery    . Cardiac catheterization  11/16/09    SMOOTH AND NORMAL  . Total abdominal hysterectomy    . Laparoscopic cholecystectomy    . Bladder suspension      Current Outpatient Prescriptions  Medication Sig Dispense Refill  . citalopram (CELEXA) 20 MG tablet Take 20 mg by mouth daily.        . digoxin (LANOXIN)  0.125 MG tablet Take 1 tablet (125 mcg total) by mouth daily.  90 tablet  1  . esomeprazole (NEXIUM) 40 MG capsule Take 40 mg by mouth as needed.      . furosemide (LASIX) 40 MG tablet Take 80 mg by mouth daily.      . GuaiFENesin (MUCINEX PO) Take 1 tablet by mouth 2 (two) times daily as needed. For chest congestion      . insulin glargine (LANTUS) 100 UNIT/ML injection Inject 110 Units into the skin 2 (two) times daily. Takes 45 units in the morning and 40 units at night      . KLOR-CON 10 10 MEQ tablet Take 10 mEq by mouth daily.       Marland Kitchen losartan (COZAAR) 100 MG tablet Take 1 tablet (100 mg total) by mouth daily.  90 tablet  1  . metFORMIN (GLUCOPHAGE) 500 MG tablet Take 500-1,000 mg by mouth 2 (two) times daily with a meal. Take 1000mg  in the morning and 500mg  in the evening      . metolazone (ZAROXOLYN) 2.5 MG tablet Take 2.5 mg by mouth daily as needed.       . Vitamin D, Ergocalciferol, (DRISDOL) 50000 UNITS CAPS Take 50,000 Units by mouth every 7 (seven) days. Takes on Thursday      . XARELTO 20 MG TABS tablet TAKE 1 TABLET BY MOUTH EVERY MORNING  30 tablet  5  .  zolpidem (AMBIEN) 10 MG tablet Take 10 mg by mouth at bedtime as needed. For sleep       No current facility-administered medications for this visit.    Allergies  Allergen Reactions  . Albuterol     REACTION: Tachycardia- AFib  . Bee Venom Other (See Comments)    "makes me nervous"  . Ivp Dye [Iodinated Diagnostic Agents] Other (See Comments)    flushing  . Penicillins     REACTION: flushing \\T \ hot    History   Social History  . Marital Status: Married    Spouse Name: N/A    Number of Children: N/A  . Years of Education: N/A   Occupational History  . Not on file.   Social History Main Topics  . Smoking status: Never Smoker   . Smokeless tobacco: Never Used  . Alcohol Use: No  . Drug Use: No  . Sexual Activity: Not on file   Other Topics Concern  . Not on file   Social History Narrative   Pt lives  in [place] with spouse.   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    Family History  Problem Relation Age of Onset  . Stroke Father   . Hypertension Father   . Atrial fibrillation Father     HAD MURMUR  . Heart failure Mother   . Hypertension Brother   . Hypertension Sister   . Hypertension Son     ROS- All systems are reviewed and negative except as per the HPI above  Physical Exam: Filed Vitals:   10/13/13 0901  BP: 147/93  Pulse: 90  Height: 5\' 9"  (1.753 m)  Weight: 208 lb (94.348 kg)    GEN- The patient is morbidly obese appearing, alert and oriented x 3 today.   Head- normocephalic, atraumatic Eyes-  Sclera clear, conjunctiva pink Ears- hearing intact Oropharynx- clear Neck- supple,  Lungs- Clear to ausculation bilaterally, normal work of breathing Heart- irregular rate and rhythm  GI- soft, NT, ND, + BS Extremities- no clubbing, cyanosis, + edema MS- no significant deformity or atrophy Skin- no rash or lesion Psych- euthymic mood, full affect Neuro- strength and sensation are intact  EKG today reveals afib, V rate 90 bpm, Qtc 439 Echo 3/15 is reviewed- EFJ 50% with moderate biatrial enlargement Epic records are reviewed.  Dr Lanny Hurst notes are reviewed   Assessment and Plan:  1.  Longstanding persistent afib The patient has had afib for quite some time.  I suspect that her difficulties with diastolic dysfunction and SOB are exacerbated by afib.  She would likely do to better in sinus rhythm.  Unfortunately she has likely developed significant atriopathy and our ability to achieve and maintain sinus is reduced. Therapeutic strategies for afib including rate control and rhythm control were discussed in detail with the patient today. Per guidelines, AAD therapy is required prior to consideration of ablation.  I agree with Dr Acie Fredrickson that Phyllis Ginger would be a good option  for her.  I have recommended admission for tikosyn.  Presently, she remains reluctant to proceed.  She would like to consider this further.  She will return in 2 weeks to follow-up with Roderic Palau in the [facility] clinic.  Her chads2vasc score is at least 4.  She is appropriately anticoagulated with xarelto.  I would stop digoxin prior to initiation of tikosyn.  2. Hypertensive cardiovascular disease  Stable No change required today  3. Obesity Body mass index is 30.7 kg/(m^2). Weight loss is strongly encouraged  4. Diastolic dysfunction As above, I think that this would be better controlled in sinus rhythm

## 2013-10-13 NOTE — Patient Instructions (Signed)
Your physician recommends that you schedule a follow-up appointment in: 2 weeks with Ceasar Lund to discuss Tikosyn Load

## 2013-10-15 DIAGNOSIS — E669 Obesity, unspecified: Secondary | ICD-10-CM | POA: Insufficient documentation

## 2013-10-24 DIAGNOSIS — IMO0002 Reserved for concepts with insufficient information to code with codable children: Secondary | ICD-10-CM | POA: Diagnosis not present

## 2013-10-24 DIAGNOSIS — I10 Essential (primary) hypertension: Secondary | ICD-10-CM | POA: Diagnosis not present

## 2013-10-24 DIAGNOSIS — E1165 Type 2 diabetes mellitus with hyperglycemia: Secondary | ICD-10-CM | POA: Diagnosis not present

## 2013-10-24 DIAGNOSIS — I4891 Unspecified atrial fibrillation: Secondary | ICD-10-CM | POA: Diagnosis not present

## 2013-10-24 DIAGNOSIS — Z6832 Body mass index (BMI) 32.0-32.9, adult: Secondary | ICD-10-CM | POA: Diagnosis not present

## 2013-10-24 DIAGNOSIS — R609 Edema, unspecified: Secondary | ICD-10-CM | POA: Diagnosis not present

## 2013-11-01 ENCOUNTER — Encounter: Payer: Self-pay | Admitting: Internal Medicine

## 2013-11-01 ENCOUNTER — Other Ambulatory Visit: Payer: Self-pay | Admitting: *Deleted

## 2013-11-01 ENCOUNTER — Telehealth: Payer: Self-pay | Admitting: Internal Medicine

## 2013-11-01 ENCOUNTER — Ambulatory Visit (INDEPENDENT_AMBULATORY_CARE_PROVIDER_SITE_OTHER): Payer: Medicare Other | Admitting: Internal Medicine

## 2013-11-01 VITALS — BP 156/83 | HR 108 | Ht 69.0 in | Wt 217.0 lb

## 2013-11-01 DIAGNOSIS — I4891 Unspecified atrial fibrillation: Secondary | ICD-10-CM

## 2013-11-01 DIAGNOSIS — I119 Hypertensive heart disease without heart failure: Secondary | ICD-10-CM | POA: Diagnosis not present

## 2013-11-01 DIAGNOSIS — I4819 Other persistent atrial fibrillation: Secondary | ICD-10-CM

## 2013-11-01 DIAGNOSIS — I5032 Chronic diastolic (congestive) heart failure: Secondary | ICD-10-CM

## 2013-11-01 MED ORDER — METOPROLOL SUCCINATE ER 50 MG PO TB24: 50.0000 mg | ORAL_TABLET | Freq: Every day | ORAL | Status: DC

## 2013-11-01 NOTE — Patient Instructions (Addendum)
Your physician has recommended you make the following change in your medication:  1.) stop digoxin 2.) begin TOPROL XL 50 mg once daily  Today Dr. Rayann Heman discussed starting Tikosyn.   Please call after you think about this to let us know if you want to start this medication.

## 2013-11-01 NOTE — Telephone Encounter (Signed)
New message      Patient saw Roderic Palau today.  [facility] and clarify the [facility]prol presc.  [facility]  but the pt said she is to take 50mg .

## 2013-11-01 NOTE — Telephone Encounter (Signed)
Left pt a message that the prescription for Toprol 50 mg once daily was send to [facility]. Pt to call back if needed.

## 2013-11-01 NOTE — Progress Notes (Signed)
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 She reports being in good health until 2011 when she developed pneumonia.  She says that while receiving a breathing treatment that she developed rapid irregular palpitations.  She has been in atrial fibrillation since that time.  An attempt at cardioversion was made about 3 years ago which was unsuccessful.  She has been treated with rate control since that time. She had difficulty with diastolic dysfunction which is felt to be due to her afib.  She has rare palpitations but is frequently "aware of her heart".  Her energy is "fair".  She has dypsnea with moderate activity.  She feels that abdominal distension is her primary symptom with CHF.   She was seen 7/26 and Tikosyn was discussed with the patient and she was very uncertain to peruse. She is back today for further discussion. Questions were answered to her satisfaction and she is willing to go ahead with plans but wants to wait until after vacation in September.  Rate control/BP is not optimal and for unclear reasons has stopped Toprol about 6 weeks ago. Digoxin will be stopped for Tikosyn load and BB restarted.  Today, she denies symptoms of chest pain, orthopnea, PND, lower extremity edema, dizziness, presyncope, syncope, or neurologic sequela. The patient is tolerating medications without difficulties and is otherwise without complaint today.   Past Medical History  Diagnosis Date  . Persistent atrial fibrillation   . Morbid obesity   . Pneumonia 2011  . Diabetes mellitus   . HTN (hypertension)   . Abnormal cardiovascular function study     MYOVIEW SHOWING ANTERIOR WALL ATTENUATION  . High cholesterol   . Scoliosis   . Spinal stenosis   . Anxiety   . Osteoarthritis   . Osteoporosis   . Mild aortic sclerosis   .  Degenerative disc disease   . Vitamin D deficiency    Past Surgical History  Procedure Laterality Date  . Finger fracture surgery    . Cardiac catheterization  11/16/09    SMOOTH AND NORMAL  . Total abdominal hysterectomy    . Laparoscopic cholecystectomy    . Bladder suspension      Current Outpatient Prescriptions  Medication Sig Dispense Refill  . citalopram (CELEXA) 20 MG tablet Take 20 mg by mouth daily.        Marland Kitchen esomeprazole (NEXIUM) 40 MG capsule Take 40 mg by mouth as needed.      . furosemide (LASIX) 40 MG tablet Take 80 mg by mouth daily.      . GuaiFENesin (MUCINEX PO) Take 1 tablet by mouth 2 (two) times daily as needed. For chest congestion      . insulin glargine (LANTUS) 100 UNIT/ML injection Takes 45 units in the morning and 41 units at night      . KLOR-CON 10 10 MEQ tablet Take 10 mEq by mouth daily.       Marland Kitchen losartan (COZAAR) 100 MG tablet Take 1 tablet (100 mg total) by mouth daily.  90 tablet  1  . metFORMIN (GLUCOPHAGE) 500 MG tablet Take 500-1,000 mg by mouth 2 (two) times daily with a meal. Take 1000mg  in the morning and 500mg  in the evening      . metolazone (ZAROXOLYN) 2.5 MG tablet Take 2.5 mg by mouth daily as needed.       Marland Kitchen  Vitamin D, Ergocalciferol, (DRISDOL) 50000 UNITS CAPS Take 50,000 Units by mouth every 7 (seven) days. Takes on Thursday      . XARELTO 20 MG TABS tablet TAKE 1 TABLET BY MOUTH EVERY MORNING  30 tablet  5  . zolpidem (AMBIEN) 10 MG tablet Take 10 mg by mouth at bedtime as needed. For sleep      . metoprolol succinate (TOPROL-XL) 50 MG 24 hr tablet Take 1 tablet (50 mg total) by mouth daily. Take with or immediately following a meal.  90 tablet  3   No current facility-administered medications for this visit.    Allergies  Allergen Reactions  . Albuterol     REACTION: Tachycardia- AFib  . Bee Venom Other (See Comments)    "makes me nervous"  . Ivp Dye [Iodinated Diagnostic Agents] Other (See Comments)    flushing  . Penicillins       REACTION: flushing \\T \ hot    History   Social History  . Marital Status: Married    Spouse Name: N/A    Number of Children: N/A  . Years of Education: N/A   Occupational History  . Not on file.   Social History Main Topics  . Smoking status: Never Smoker   . Smokeless tobacco: Never Used  . Alcohol Use: No  . Drug Use: No  . Sexual Activity: Not on file   Other Topics Concern  . Not on file   Social History Narrative   Pt lives in [place] with spouse.   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    Family History  Problem Relation Age of Onset  . Stroke Father   . Hypertension Father   . Atrial fibrillation Father     HAD MURMUR  . Heart failure Mother   . Hypertension Brother   . Hypertension Sister   . Hypertension Son     ROS- All systems are reviewed and negative except as per the HPI above  Physical Exam: Filed Vitals:   11/01/13 1019  BP: 156/83  Pulse: 108  Height: 5\' 9"  (1.753 m)  Weight: 98.431 kg (217 lb)    GEN- The patient is morbidly obese appearing, alert and oriented x 3 today.   Head- normocephalic, atraumatic Eyes-  Sclera clear, conjunctiva pink Ears- hearing intact Oropharynx- clear Neck- supple,  Lungs- Clear to ausculation bilaterally, normal work of breathing Heart- irregular rate and rhythm  GI- soft, NT, ND, + BS Extremities- no clubbing, cyanosis, + edema MS- no significant deformity or atrophy Skin- no rash or lesion Psych- euthymic mood, full affect Neuro- strength and sensation are intact  EKG today reveals afib,  V rate 108/min, QTCc 511 ms. Echo 3/15 is reviewed- EF 50% with moderate biatrial enlargement Epic records are reviewed.  Dr Lanny Hurst notes are reviewed   Assessment and Plan:  1.  Longstanding persistent afib The patient has had afib for quite some time.  I suspect that her difficulties with diastolic  dysfunction and SOB are exacerbated by afib.  She would likely do to better in sinus rhythm.  Unfortunately she has likely developed significant atriopathy and our ability to achieve and maintain sinus is reduced. Therapeutic strategies for afib including rate control and rhythm control were discussed in detail with the patient today. Per guidelines, AAD therapy is required prior to consideration of ablation.  I agree with Dr Acie Fredrickson  that Tikosyn would be a good option for her.  I have recommended admission for tikosyn.  Presently, she  is willing to proceed but wants to wait until early fall. She was encouraged to schedule in next few weeks and she will try to arrange schedule and call office in am as to her intent.  I will refer to the [facility] for initiation.  Her chads2vasc score is at least 4.  She is appropriately anticoagulated with xarelto. Digoxin to be stopped today.  2. Hypertensive cardiovascular disease Not optimal control. Restart Toprol XL at 50 mg a day.  She was previously on toprol 200mg  daily.  It is not clear why she stopped the medicine.  There is no documentation in epic of this being discontinued.  3. Obesity Body mass index is 32.03 kg/(m^2). Weight loss is strongly encouraged  4. Diastolic dysfunction As above, I think that this would be better controlled in sinus rhythm  Return to see Roderic Palau NP in the afib clinic 6 weeks after tikosyn load.

## 2013-11-02 ENCOUNTER — Telehealth: Payer: Self-pay | Admitting: Internal Medicine

## 2013-11-02 NOTE — Telephone Encounter (Signed)
Informed patient she will be hearing from [place] or [place]. Staff message sent to inform, as well as to Dr. Rayann Heman and Roderic Palau, NP

## 2013-11-02 NOTE — Telephone Encounter (Signed)
New message         Pt is ready to set up appt for [facility]

## 2013-11-07 ENCOUNTER — Ambulatory Visit (INDEPENDENT_AMBULATORY_CARE_PROVIDER_SITE_OTHER): Payer: Medicare Other | Admitting: Pharmacist

## 2013-11-07 VITALS — Wt 219.0 lb

## 2013-11-07 DIAGNOSIS — I4891 Unspecified atrial fibrillation: Secondary | ICD-10-CM | POA: Diagnosis not present

## 2013-11-07 LAB — BASIC METABOLIC PANEL
BUN: 30 mg/dL — ABNORMAL HIGH (ref 6–23)
CO2: 32 mEq/L (ref 19–32)
CREATININE: 1.1 mg/dL (ref 0.4–1.2)
Calcium: 9.2 mg/dL (ref 8.4–10.5)
Chloride: 93 mEq/L — ABNORMAL LOW (ref 96–112)
GFR: 53.26 mL/min — AB (ref >60.00)
Glucose, Bld: 189 mg/dL — ABNORMAL HIGH (ref 70–99)
Potassium: 3.7 mEq/L (ref 3.5–5.1)
SODIUM: 136 meq/L (ref 135–145)

## 2013-11-07 LAB — DIGOXIN LEVEL: Digoxin Level: 0.1 ng/mL — ABNORMAL LOW (ref 0.8–2.0)

## 2013-11-07 LAB — MAGNESIUM: MAGNESIUM: 1.9 mg/dL (ref 1.5–2.5)

## 2013-11-07 NOTE — Assessment & Plan Note (Addendum)
Patients labs reviewed.  K- 3.7, Magnesium 1.9.  Patient's potassium is too low to proceed with Tikosyn load.  Her usual dosage of potassium is 20 mEq daily.  She took 20 mEq this morning already.  Patient advised to go home and take another 20 mEq when she gets home ~ 2 PM, and then to take another 20 mEq tonight with dinner. She will take her usual 20 mEq tomorrow morning and come in for repeat BMET and Magnesium tomorrow, then likely go to hospital to start [place] 11/08/13.  She is to not take metolazone tonight.  Patient shows verbal understanding of plan and will recheck labs tomorrow morning.  11/08/13:  Labs reviewed:  K-3.8, Magnesium- 1.8, Digoxin 0.1.  Discussed with Dr. Rayann Heman who also saw patient in office this morning.  Will send her to hospital and give potassium supplementation IV.  Dr. Rayann Heman to write orders.  QTc acceptable per Dr. Rayann Heman given her pulse.  Will be placed on 2W.

## 2013-11-07 NOTE — Progress Notes (Signed)
Kristine [name] is a 68 y.o. Female presenting today for Tikosyn initiation.  Patient last saw Dr. Rayann Heman on 11/01/13 for EP consultation.  Patient had an unsuccessful cardioversion three years ago, and had been treated with rate control since that time.  She stopped metoprolol for unknown reason 6 weeks ago.  She was also on digoxin at time of meeting with Dr. Rayann Heman.  She has dyspnea with moderate activity.  Dr. Already discussed options with patient, and elected to have her restart metoprolol and plan for tikosyn initiation.  Patient's digoxin was stopped 11/01/13 in anticipation of Tikosyn starting this week.    Patient and husband educated on potential side effects of Tikosyn, including QTc prolongation.  She is aware of the importance of compliance and will call the office if she misses more than 2 doses in a row.  Patient confirmed she has private insurance and brand name agents are a $10-20 copay.  I gave patient a coupon card for Tikosyn which will hopefully limit cost of medication to no more than $10 per month.    Reviewed patients medication list.  She is not taking any QTc prolongating or contraindicated medications on a daily basis.  She does take metolazone 2.5 mg as needed, which is typically once weekly.  Her last dose of this was 3 days ago.  Her heart rate has historically been high, making it difficult to ascertain her true QT interval.  Spoke with Dr. Rayann Heman about patient's EKG and historically he states it appears her QTc is ~ 439 msec or less.  Don't have an EKG in normal sinus rhythm to compare.    EKG on 11/07/13 reviewed by Dr. Lovena Le and Dr. Rayann Heman.  Dr. Rayann Heman states the QTc of 494 is falsely high due to patients heart rate, and that it is likely closer to 439-440 msec, and that it would be okay to proceed to Tikosyn initiation today.  Patient is still in Atrial fibrillation with v rate of 90 bpm.  Current Outpatient Prescriptions  Medication Sig Dispense Refill  . citalopram  (CELEXA) 20 MG tablet Take 20 mg by mouth daily.        Marland Kitchen esomeprazole (NEXIUM) 40 MG capsule Take 40 mg by mouth as needed.      . furosemide (LASIX) 40 MG tablet Take 80 mg by mouth daily.      . GuaiFENesin (MUCINEX PO) Take 1 tablet by mouth 2 (two) times daily as needed. For chest congestion      . insulin glargine (LANTUS) 100 UNIT/ML injection Takes 45 units in the morning and 41 units at night      . KLOR-CON 10 10 MEQ tablet Take 10 mEq by mouth daily.       Marland Kitchen losartan (COZAAR) 100 MG tablet Take 1 tablet (100 mg total) by mouth daily.  90 tablet  1  . metFORMIN (GLUCOPHAGE) 500 MG tablet Take 500-1,000 mg by mouth 2 (two) times daily with a meal. Take 1000mg  in the morning and 500mg  in the evening      . metolazone (ZAROXOLYN) 2.5 MG tablet Take 2.5 mg by mouth daily as needed.       . metoprolol succinate (TOPROL-XL) 50 MG 24 hr tablet Take 1 tablet (50 mg total) by mouth daily. Take with or immediately following a meal.  30 tablet  5  . Vitamin D, Ergocalciferol, (DRISDOL) 50000 UNITS CAPS Take 50,000 Units by mouth every 7 (seven) days. Takes on Thursday      .  XARELTO 20 MG TABS tablet TAKE 1 TABLET BY MOUTH EVERY MORNING  30 tablet  5  . zolpidem (AMBIEN) 10 MG tablet Take 10 mg by mouth at bedtime as needed. For sleep       No current facility-administered medications for this visit.   Allergies  Allergen Reactions  . Albuterol     REACTION: Tachycardia- AFib  . Bee Venom Other (See Comments)    "makes me nervous"  . Ivp Dye [Iodinated Diagnostic Agents] Other (See Comments)    flushing  . Penicillins     REACTION: flushing \\T \ hot

## 2013-11-08 ENCOUNTER — Ambulatory Visit (INDEPENDENT_AMBULATORY_CARE_PROVIDER_SITE_OTHER): Payer: Medicare Other | Admitting: *Deleted

## 2013-11-08 ENCOUNTER — Encounter (HOSPITAL_COMMUNITY): Payer: Self-pay | Admitting: Cardiology

## 2013-11-08 ENCOUNTER — Encounter: Payer: Self-pay | Admitting: Internal Medicine

## 2013-11-08 ENCOUNTER — Inpatient Hospital Stay (HOSPITAL_COMMUNITY)
Admission: AD | Admit: 2013-11-08 | Discharge: 2013-11-11 | DRG: 309 | Disposition: A | Discharge status: Home / Self Care | Payer: Medicare Other | Source: Ambulatory Visit | Attending: Internal Medicine | Admitting: Internal Medicine

## 2013-11-08 ENCOUNTER — Ambulatory Visit: Payer: Medicare Other | Admitting: Dietician

## 2013-11-08 DIAGNOSIS — Z91041 Radiographic dye allergy status: Secondary | ICD-10-CM | POA: Diagnosis not present

## 2013-11-08 DIAGNOSIS — Z79899 Other long term (current) drug therapy: Secondary | ICD-10-CM | POA: Diagnosis not present

## 2013-11-08 DIAGNOSIS — Z9089 Acquired absence of other organs: Secondary | ICD-10-CM

## 2013-11-08 DIAGNOSIS — Z794 Long term (current) use of insulin: Secondary | ICD-10-CM

## 2013-11-08 DIAGNOSIS — Z6832 Body mass index (BMI) 32.0-32.9, adult: Secondary | ICD-10-CM | POA: Diagnosis not present

## 2013-11-08 DIAGNOSIS — I110 Hypertensive heart disease with heart failure: Secondary | ICD-10-CM | POA: Diagnosis present

## 2013-11-08 DIAGNOSIS — E876 Hypokalemia: Secondary | ICD-10-CM | POA: Diagnosis present

## 2013-11-08 DIAGNOSIS — I5032 Chronic diastolic (congestive) heart failure: Secondary | ICD-10-CM | POA: Diagnosis present

## 2013-11-08 DIAGNOSIS — N179 Acute kidney failure, unspecified: Secondary | ICD-10-CM | POA: Diagnosis present

## 2013-11-08 DIAGNOSIS — E559 Vitamin D deficiency, unspecified: Secondary | ICD-10-CM | POA: Diagnosis present

## 2013-11-08 DIAGNOSIS — Z823 Family history of stroke: Secondary | ICD-10-CM

## 2013-11-08 DIAGNOSIS — E669 Obesity, unspecified: Secondary | ICD-10-CM | POA: Diagnosis present

## 2013-11-08 DIAGNOSIS — M810 Age-related osteoporosis without current pathological fracture: Secondary | ICD-10-CM | POA: Diagnosis present

## 2013-11-08 DIAGNOSIS — Z888 Allergy status to other drugs, medicaments and biological substances status: Secondary | ICD-10-CM

## 2013-11-08 DIAGNOSIS — Z880 Allergy status to penicillin: Secondary | ICD-10-CM | POA: Diagnosis not present

## 2013-11-08 DIAGNOSIS — I4819 Other persistent atrial fibrillation: Secondary | ICD-10-CM | POA: Diagnosis present

## 2013-11-08 DIAGNOSIS — I4891 Unspecified atrial fibrillation: Secondary | ICD-10-CM | POA: Diagnosis present

## 2013-11-08 DIAGNOSIS — M1990 Unspecified osteoarthritis, unspecified site: Secondary | ICD-10-CM | POA: Diagnosis present

## 2013-11-08 DIAGNOSIS — Z91038 Other insect allergy status: Secondary | ICD-10-CM

## 2013-11-08 DIAGNOSIS — Z8249 Family history of ischemic heart disease and other diseases of the circulatory system: Secondary | ICD-10-CM | POA: Diagnosis not present

## 2013-11-08 DIAGNOSIS — I700 Atherosclerosis of aorta: Secondary | ICD-10-CM | POA: Diagnosis present

## 2013-11-08 DIAGNOSIS — E7800 Pure hypercholesterolemia, unspecified: Secondary | ICD-10-CM | POA: Diagnosis present

## 2013-11-08 DIAGNOSIS — I119 Hypertensive heart disease without heart failure: Secondary | ICD-10-CM | POA: Diagnosis present

## 2013-11-08 DIAGNOSIS — R0600 Dyspnea, unspecified: Secondary | ICD-10-CM | POA: Diagnosis present

## 2013-11-08 DIAGNOSIS — F3289 Other specified depressive episodes: Secondary | ICD-10-CM | POA: Diagnosis present

## 2013-11-08 DIAGNOSIS — F329 Major depressive disorder, single episode, unspecified: Secondary | ICD-10-CM | POA: Diagnosis present

## 2013-11-08 DIAGNOSIS — IMO0001 Reserved for inherently not codable concepts without codable children: Secondary | ICD-10-CM | POA: Diagnosis present

## 2013-11-08 DIAGNOSIS — F411 Generalized anxiety disorder: Secondary | ICD-10-CM | POA: Diagnosis present

## 2013-11-08 DIAGNOSIS — R0609 Other forms of dyspnea: Secondary | ICD-10-CM | POA: Diagnosis present

## 2013-11-08 DIAGNOSIS — Z7901 Long term (current) use of anticoagulants: Secondary | ICD-10-CM | POA: Diagnosis not present

## 2013-11-08 DIAGNOSIS — E119 Type 2 diabetes mellitus without complications: Secondary | ICD-10-CM | POA: Diagnosis present

## 2013-11-08 DIAGNOSIS — F419 Anxiety disorder, unspecified: Secondary | ICD-10-CM | POA: Diagnosis present

## 2013-11-08 DIAGNOSIS — IMO0002 Reserved for concepts with insufficient information to code with codable children: Secondary | ICD-10-CM | POA: Diagnosis present

## 2013-11-08 DIAGNOSIS — I509 Heart failure, unspecified: Secondary | ICD-10-CM | POA: Diagnosis present

## 2013-11-08 DIAGNOSIS — R0989 Other specified symptoms and signs involving the circulatory and respiratory systems: Secondary | ICD-10-CM

## 2013-11-08 LAB — BASIC METABOLIC PANEL
Anion gap: 15 (ref 5–15)
BUN: 26 mg/dL — ABNORMAL HIGH (ref 6–23)
BUN: 30 mg/dL — ABNORMAL HIGH (ref 6–23)
CALCIUM: 9.1 mg/dL (ref 8.4–10.5)
CO2: 30 mEq/L (ref 19–32)
CO2: 29 mEq/L (ref 19–32)
Calcium: 9.4 mg/dL (ref 8.4–10.5)
Chloride: 95 mEq/L — ABNORMAL LOW (ref 96–112)
Chloride: 93 mEq/L — ABNORMAL LOW (ref 96–112)
Creatinine, Ser: 1.2 mg/dL (ref 0.4–1.2)
Creatinine, Ser: 1.26 mg/dL — ABNORMAL HIGH (ref 0.50–1.10)
GFR, EST AFRICAN AMERICAN: 50 mL/min — AB (ref >90)
GFR, EST NON AFRICAN AMERICAN: 43 mL/min — AB (ref >90)
GFR: 46.76 mL/min — ABNORMAL LOW (ref >60.00)
GLUCOSE: 248 mg/dL — AB (ref 70–99)
Glucose, Bld: 131 mg/dL — ABNORMAL HIGH (ref 70–99)
POTASSIUM: 4.2 meq/L (ref 3.7–5.3)
Potassium: 3.8 mEq/L (ref 3.5–5.1)
SODIUM: 137 meq/L (ref 137–147)
Sodium: 138 mEq/L (ref 135–145)

## 2013-11-08 LAB — GLUCOSE, CAPILLARY
Glucose-Capillary: 152 mg/dL — ABNORMAL HIGH (ref 70–99)
Glucose-Capillary: 224 mg/dL — ABNORMAL HIGH (ref 70–99)

## 2013-11-08 LAB — MAGNESIUM: MAGNESIUM: 1.8 mg/dL (ref 1.5–2.5)

## 2013-11-08 MED ORDER — VITAMIN D (ERGOCALCIFEROL) 1.25 MG (50000 UNIT) PO CAPS
50000.0000 [IU] | ORAL_CAPSULE | ORAL | Status: DC
  Administered 2013-11-09: 50000 [IU] via ORAL
  Filled 2013-11-08: qty 1

## 2013-11-08 MED ORDER — METFORMIN HCL 500 MG PO TABS
1000.0000 mg | ORAL_TABLET | Freq: Every day | ORAL | Status: DC
  Administered 2013-11-09 – 2013-11-11 (×3): 1000 mg via ORAL
  Filled 2013-11-08 (×4): qty 2

## 2013-11-08 MED ORDER — INSULIN GLARGINE 100 UNIT/ML ~~LOC~~ SOLN
41.0000 [IU] | Freq: Every day | SUBCUTANEOUS | Status: DC
  Administered 2013-11-08 – 2013-11-10 (×3): 41 [IU] via SUBCUTANEOUS
  Filled 2013-11-08 (×4): qty 0.41

## 2013-11-08 MED ORDER — SODIUM CHLORIDE 0.9 % IV SOLN
250.0000 mL | INTRAVENOUS | Status: DC | PRN
  Administered 2013-11-08: 250 mL via INTRAVENOUS

## 2013-11-08 MED ORDER — DOFETILIDE 500 MCG PO CAPS
500.0000 ug | ORAL_CAPSULE | Freq: Two times a day (BID) | ORAL | Status: DC
  Administered 2013-11-08: 500 ug via ORAL
  Filled 2013-11-08 (×4): qty 1

## 2013-11-08 MED ORDER — LOSARTAN POTASSIUM 50 MG PO TABS
100.0000 mg | ORAL_TABLET | Freq: Every day | ORAL | Status: DC
  Administered 2013-11-09 – 2013-11-11 (×3): 100 mg via ORAL
  Filled 2013-11-08 (×3): qty 2

## 2013-11-08 MED ORDER — POTASSIUM CHLORIDE ER 10 MEQ PO TBCR
20.0000 meq | EXTENDED_RELEASE_TABLET | Freq: Every day | ORAL | Status: DC
  Filled 2013-11-08: qty 2

## 2013-11-08 MED ORDER — SODIUM CHLORIDE 0.9 % IJ SOLN: 3.0000 mL | INTRAMUSCULAR | Status: DC | PRN

## 2013-11-08 MED ORDER — SODIUM CHLORIDE 0.9 % IJ SOLN
3.0000 mL | Freq: Two times a day (BID) | INTRAMUSCULAR | Status: DC
  Administered 2013-11-08 – 2013-11-11 (×4): 3 mL via INTRAVENOUS

## 2013-11-08 MED ORDER — METFORMIN HCL 500 MG PO TABS
500.0000 mg | ORAL_TABLET | Freq: Every day | ORAL | Status: DC
  Administered 2013-11-08 – 2013-11-10 (×3): 500 mg via ORAL
  Filled 2013-11-08 (×4): qty 1

## 2013-11-08 MED ORDER — METOPROLOL SUCCINATE ER 50 MG PO TB24
50.0000 mg | ORAL_TABLET | Freq: Every day | ORAL | Status: DC
  Administered 2013-11-09 – 2013-11-11 (×3): 50 mg via ORAL
  Filled 2013-11-08 (×3): qty 1

## 2013-11-08 MED ORDER — CITALOPRAM HYDROBROMIDE 20 MG PO TABS
20.0000 mg | ORAL_TABLET | Freq: Every day | ORAL | Status: DC
  Filled 2013-11-08: qty 1

## 2013-11-08 MED ORDER — ZOLPIDEM TARTRATE 5 MG PO TABS
5.0000 mg | ORAL_TABLET | Freq: Every evening | ORAL | Status: DC | PRN
  Administered 2013-11-08 – 2013-11-10 (×3): 5 mg via ORAL
  Filled 2013-11-08 (×2): qty 1

## 2013-11-08 MED ORDER — INSULIN GLARGINE 100 UNIT/ML ~~LOC~~ SOLN
45.0000 [IU] | Freq: Every morning | SUBCUTANEOUS | Status: DC
  Administered 2013-11-09 – 2013-11-11 (×3): 45 [IU] via SUBCUTANEOUS
  Filled 2013-11-08 (×3): qty 0.45

## 2013-11-08 MED ORDER — PANTOPRAZOLE SODIUM 40 MG PO TBEC
40.0000 mg | DELAYED_RELEASE_TABLET | Freq: Every day | ORAL | Status: DC
  Administered 2013-11-08 – 2013-11-11 (×4): 40 mg via ORAL
  Filled 2013-11-08 (×4): qty 1

## 2013-11-08 MED ORDER — METFORMIN HCL 500 MG PO TABS
500.0000 mg | ORAL_TABLET | Freq: Every day | ORAL | Status: DC
  Filled 2013-11-08: qty 1

## 2013-11-08 MED ORDER — POTASSIUM CHLORIDE 10 MEQ/100ML IV SOLN
10.0000 meq | INTRAVENOUS | Status: AC
  Administered 2013-11-08 (×3): 10 meq via INTRAVENOUS
  Filled 2013-11-08 (×3): qty 100

## 2013-11-08 MED ORDER — RIVAROXABAN 20 MG PO TABS
20.0000 mg | ORAL_TABLET | Freq: Every day | ORAL | Status: DC
  Administered 2013-11-09 – 2013-11-10 (×2): 20 mg via ORAL
  Filled 2013-11-08 (×3): qty 1

## 2013-11-08 NOTE — Progress Notes (Signed)
Pharmacy Consult for Dofetilide (Tikosyn) Initiation  Admit Complaint: 68 y.o. female admitted 11/08/2013 with atrial fibrillation to be initiated on dofetilide.   Assessment:  Patient Exclusion Criteria: If any screening criteria checked as "Yes", then  patient  should NOT receive dofetilide until criteria item is corrected. If "Yes" please indicate correction plan.  YES  NO Patient  Exclusion Criteria Correction Plan  []  [x]  Baseline QTc interval is greater than or equal to 440 msec. IF above YES box checked dofetilide contraindicated unless patient has ICD; then may proceed if QTc 500-550 msec or with known ventricular conduction abnormalities may proceed with QTc 550-600 msec. QTc =  439   []  [x]  Magnesium level is less than 1.8 mEq/l : Last magnesium:  Lab Results  Component Value Date   MG 1.8 11/08/2013         [x]  []  Potassium level is less than 4 mEq/l : Last potassium:  Lab Results  Component Value Date   K 3.8 11/08/2013         []  [x]  Patient is known or suspected to have a digoxin level greater than 2 ng/ml: Lab Results  Component Value Date   DIGOXIN 0.1* 11/07/2013      []  [x]  Creatinine clearance less than 20 ml/min (calculated using Cockcroft-Gault, actual body weight and serum creatinine): Estimated Creatinine Clearance: 56.2 ml/min (by C-G formula based on Cr of 1.2).    []  [x]  Patient has received drugs known to prolong the QT intervals within the last 48 hours(phenothiazines, tricyclics or tetracyclic antidepressants, erythromycin, H-1 antihistamines, cisapride, fluoroquinolones, azithromycin). Drugs not listed above may have an, as yet, undetected potential to prolong the QT interval, updated information on QT prolonging agents is available at this website:QT prolonging agents   []  [x]  Patient received a dose of hydrochlorothiazide (Oretic) alone or in any combination including triamterene (Dyazide, Maxzide) in the last 48 hours.   []  [x]  Patient received a  medication known to increase dofetilide plasma concentrations prior to initial dofetilide dose:    Trimethoprim (Primsol, Proloprim) in the last 36 hours   Verapamil (Calan, Verelan) in the last 36 hours or a sustained release dose in the last 72 hours   Megestrol (Megace) in the last 5 days    Cimetidine (Tagamet) in the last 6 hours   Ketoconazole (Nizoral) in the last 24 hours   Itraconazole (Sporanox) in the last 48 hours    Prochlorperazine (Compazine) in the last 36 hours    []  [x]  Patient is known to have a history of torsades de pointes; congenital or acquired long QT syndromes.   []  [x]  Patient has received a Class 1 antiarrhythmic with less than 2 half-lives since last dose. (Disopyramide, Quinidine, Procainamide, Lidocaine, Mexiletine, Flecainide, Propafenone)   []  [x]  Patient has received amiodarone therapy in the past 3 months or amiodarone level is greater than 0.3 ng/ml.    Patient has been appropriately anticoagulated with Xarelto 20 mg daily.  Ordering provider was confirmed at [email or web address] if they are not listed on the [facility] Prescribers list.  Goal of Therapy:  Follow renal function, electrolytes, potential drug interactions, and dose adjustment. Provide education and 1 week supply at discharge.  Plan:  1.  Initiate dofetilide based on renal function: Select One Calculated CrCl  Dose q12h  [x]  > 60 ml/min 500 mcg  []  40-60 ml/min 250 mcg  []  20-40 ml/min 125 mcg     2. Follow up QTc after the first 5 doses, renal function,  electrolytes (K & Mg) daily x 3     days, dose adjustment, success of initiation and facilitate 1 week discharge supply as     clinically indicated.  3. Initiate Tikosyn education video (Call [phone] and ask for video # 116).   4. Supplementing potassium with 10 mEq IV x 3; bmet to be repeated at 7pm.  To start Dofetilide at 8pm if K+ > or = 4.0.    5. On Celexa 20 mg daily.  Spoke with Ellin Saba, RPh, who had spoken with Dr.  Rayann Heman.  Aware of potential QT-prolongation with Celexa, which is more likely with higher doses.   Arty Baumgartner, RPh  4:03 PM 11/08/2013

## 2013-11-08 NOTE — Progress Notes (Signed)
11/08/2013 09:30 PM  Progress Note The patient had a heart rate going between 105 to 130 beats per minute after she was given scheduled Tikosyn at 08:00 PM.  The patient was asymptomatic.  Suddenly at around 09:30 PM, the patient's heart rhythm converted to normal sinus rhythm at a rate of 75. Will continue to monitor patient's progress. Kristine [name]

## 2013-11-08 NOTE — Progress Notes (Signed)
Pt watched Tikosyn educational video with husband at bedside.  Rowe Pavy, RN

## 2013-11-08 NOTE — H&P (Signed)
Primary Care Physician: Tivis Ringer, MD  Referring Physician: Dr Georgina Peer is a 68 y.o. female with a h/o obesity, DM, HTN, and persistent afib who presents for initiation of tikosyn. She reports being in good health until 2011 when she developed pneumonia. She says that while receiving a breathing treatment that she developed rapid irregular palpitations. She has been in atrial fibrillation since that time. An attempt at cardioversion was made about 3 years ago which was unsuccessful. She has been treated with rate control since that time.  She had difficulty with diastolic dysfunction which is felt to be due to her afib. She has rare palpitations but is frequently "aware of her heart". Her energy is "fair". She has dypsnea with moderate activity. She feels that abdominal distension is her primary symptom with CHF.  She was seen 7/26 and Tikosyn was discussed with the patient and she was very uncertain to peruse. She is back today for further discussion. Questions were answered to her satisfaction and she is willing to go ahead with plans but wants to wait until after vacation in September.  Rate control/BP is not optimal and for unclear reasons has stopped Toprol about 6 weeks ago. Digoxin will be stopped for Tikosyn load and BB restarted.  Today, she denies symptoms of chest pain, orthopnea, PND, lower extremity edema, dizziness, presyncope, syncope, or neurologic sequela. The patient is tolerating medications without difficulties and is otherwise without complaint today.   Past Medical History   Diagnosis  Date   .  Persistent atrial fibrillation    .  Morbid obesity    .  Pneumonia  2011   .  Diabetes mellitus    .  HTN (hypertension)    .  Abnormal cardiovascular function study      MYOVIEW SHOWING ANTERIOR WALL ATTENUATION   .  High cholesterol    .  Scoliosis    .  Spinal stenosis    .  Anxiety    .  Osteoarthritis    .  Osteoporosis    .  Mild aortic  sclerosis    .  Degenerative disc disease    .  Vitamin D deficiency     Past Surgical History   Procedure  Laterality  Date   .  Finger fracture surgery     .  Cardiac catheterization   11/16/09     SMOOTH AND NORMAL   .  Total abdominal hysterectomy     .  Laparoscopic cholecystectomy     .  Bladder suspension      Current Outpatient Prescriptions   Medication  Sig  Dispense  Refill   .  citalopram (CELEXA) 20 MG tablet  Take 20 mg by mouth daily.     Marland Kitchen  esomeprazole (NEXIUM) 40 MG capsule  Take 40 mg by mouth as needed.     .  furosemide (LASIX) 40 MG tablet  Take 80 mg by mouth daily.     .  GuaiFENesin (MUCINEX PO)  Take 1 tablet by mouth 2 (two) times daily as needed. For chest congestion     .  insulin glargine (LANTUS) 100 UNIT/ML injection  Takes 45 units in the morning and 41 units at night     .  KLOR-CON 10 10 MEQ tablet  Take 10 mEq by mouth daily.     Marland Kitchen  losartan (COZAAR) 100 MG tablet  Take 1 tablet (100 mg total) by mouth daily.  90 tablet  1   .  metFORMIN (GLUCOPHAGE) 500 MG tablet  Take 500-1,000 mg by mouth 2 (two) times daily with a meal. Take 1000mg  in the morning and 500mg  in the evening     .  metolazone (ZAROXOLYN) 2.5 MG tablet  Take 2.5 mg by mouth daily as needed.     .  Vitamin D, Ergocalciferol, (DRISDOL) 50000 UNITS CAPS  Take 50,000 Units by mouth every 7 (seven) days. Takes on Thursday     .  XARELTO 20 MG TABS tablet  TAKE 1 TABLET BY MOUTH EVERY MORNING  30 tablet  5   .  zolpidem (AMBIEN) 10 MG tablet  Take 10 mg by mouth at bedtime as needed. For sleep     .  metoprolol succinate (TOPROL-XL) 50 MG 24 hr tablet  Take 1 tablet (50 mg total) by mouth daily. Take with or immediately following a meal.  90 tablet  3    No current facility-administered medications for this visit.    Allergies   Allergen  Reactions   .  Albuterol      REACTION: Tachycardia- AFib   .  Bee Venom  Other (See Comments)     "makes me nervous"   .  Ivp Dye [Iodinated  Diagnostic Agents]  Other (See Comments)     flushing   .  Penicillins      REACTION: flushing \\T \ hot    History    Social History   .  Marital Status:  Married     Spouse Name:  N/A     Number of Children:  N/A   .  Years of Education:  N/A    Occupational History   .  Not on file.    Social History Main Topics   .  Smoking status:  Never Smoker   .  Smokeless tobacco:  Never Used   .  Alcohol Use:  No   .  Drug Use:  No   .  Sexual Activity:  Not on file    Other Topics  Concern   .  Not on file    Social History Narrative    Pt lives in [place] with spouse.    Retired Engineer, production. RN.    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    Family History   Problem  Relation  Age of Onset   .  Stroke  Father    .  Hypertension  Father    .  Atrial fibrillation  Father      HAD MURMUR   .  Heart failure  Mother    .  Hypertension  Brother    .  Hypertension  Sister    .  Hypertension  Son    ROS- All systems are reviewed and negative except as per the HPI above  Physical Exam:  Filed Vitals:   11/08/13 1424  BP: 134/84  Pulse: 92  Temp: 98.3 F (36.8 C)   GEN- The patient is morbidly obese appearing, alert and oriented x 3 today.  Head- normocephalic, atraumatic  Eyes- Sclera clear, conjunctiva pink  Ears- hearing intact  Oropharynx- clear  Neck- supple,  Lungs- Clear to ausculation bilaterally, normal work of breathing  Heart- irregular rate and rhythm  GI- soft, NT, ND, + BS  Extremities- no clubbing, cyanosis, + edema  MS- no significant deformity or atrophy  Skin- no rash or lesion  Psych- euthymic mood, full affect  Neuro- strength and sensation are intact    Assessment and Plan:  1. Longstanding persistent afib  Admit for tikosyn If she does not convert spontaneously then she will require cardioversion on Friday. Continue xarelto When rates are rapid, her qts is difficult to  assess.  I will therefore start tikosyn and follow qt very closely when in sinus  2. Hypertensive cardiovascular disease  Stable No change required today  3. Obesity  Body mass index is 32.03 kg/(m^2).  Weight loss is strongly encouraged   4. Diastolic dysfunction  Per labs appears dry Will hold lasix and monitor  5. Hypokalemia Will supplement

## 2013-11-08 NOTE — Progress Notes (Signed)
11/08/2013 11:10 PM Progress Note An EKG was done at 11:00 PM, three hours after she took Tikosyn. The EKG strip showed QT at 506 and QTc at 557.  According to protocol, the physician must be contacted if the QTc is greater than 500.  Dr. Claiborne Billings was informed and he said to inform her health team in the morning to see what to do next. Will continue to monitor patient's progress. Lupita Dawn

## 2013-11-08 NOTE — Discharge Instructions (Signed)
Information on my medicine - XARELTO (Rivaroxaban)  This medication education was reviewed with me or my healthcare representative as part of my discharge preparation.  The pharmacist that spoke with me during my hospital stay was:  Arty Baumgartner, [facility]  Why was Xarelto prescribed for you? Xarelto was prescribed for you to reduce the risk of a blood clot forming that can cause a stroke if you have a medical condition called atrial fibrillation (a type of irregular heartbeat).  What do you need to know about xarelto ? Take your Xarelto  20 mg ONCE DAILY at the same time every day with your evening meal. If you have difficulty swallowing the tablet whole, you may crush it and mix in applesauce just prior to taking your dose.  Take Xarelto exactly as prescribed by your doctor and DO NOT stop taking Xarelto without talking to the doctor who prescribed the medication.  Stopping without other stroke prevention medication to take the place of Xarelto may increase your risk of developing a clot that causes a stroke.  Refill your prescription before you run out.  After discharge, you should have regular check-up appointments with your healthcare provider that is prescribing your Xarelto.  In the future your dose may need to be changed if your kidney function or weight changes by a significant amount.  What do you do if you miss a dose? If you are taking Xarelto ONCE DAILY and you miss a dose, take it as soon as you remember on the same day then continue your regularly scheduled once daily regimen the next day. Do not take two doses of Xarelto at the same time or on the same day.   Important Safety Information A possible side effect of Xarelto is bleeding. You should call your healthcare provider right away if you experience any of the following:   Bleeding from an injury or your nose that does not stop.   Unusual colored urine (red or dark brown) or unusual colored stools (red or  black).   Unusual bruising for unknown reasons.   A serious fall or if you hit your head (even if there is no bleeding).  Some medicines may interact with Xarelto and might increase your risk of bleeding while on Xarelto. To help avoid this, consult your healthcare provider or pharmacist prior to using any new prescription or non-prescription medications, including herbals, vitamins, non-steroidal anti-inflammatory drugs (NSAIDs) and supplements.  This website has more information on Xarelto: [email or web address]

## 2013-11-09 ENCOUNTER — Encounter (HOSPITAL_COMMUNITY): Payer: Self-pay | Admitting: General Practice

## 2013-11-09 DIAGNOSIS — E119 Type 2 diabetes mellitus without complications: Secondary | ICD-10-CM

## 2013-11-09 LAB — GLUCOSE, CAPILLARY
GLUCOSE-CAPILLARY: 155 mg/dL — AB (ref 70–99)
GLUCOSE-CAPILLARY: 210 mg/dL — AB (ref 70–99)
Glucose-Capillary: 258 mg/dL — ABNORMAL HIGH (ref 70–99)
Glucose-Capillary: 243 mg/dL — ABNORMAL HIGH (ref 70–99)

## 2013-11-09 LAB — BASIC METABOLIC PANEL
Anion gap: 15 (ref 5–15)
BUN: 30 mg/dL — ABNORMAL HIGH (ref 6–23)
CALCIUM: 8.9 mg/dL (ref 8.4–10.5)
CO2: 26 mEq/L (ref 19–32)
Chloride: 96 mEq/L (ref 96–112)
Creatinine, Ser: 1.11 mg/dL — ABNORMAL HIGH (ref 0.50–1.10)
GFR, EST AFRICAN AMERICAN: 59 mL/min — AB (ref >90)
GFR, EST NON AFRICAN AMERICAN: 51 mL/min — AB (ref >90)
GLUCOSE: 178 mg/dL — AB (ref 70–99)
POTASSIUM: 3.8 meq/L (ref 3.7–5.3)
Sodium: 137 mEq/L (ref 137–147)

## 2013-11-09 LAB — CBC
HEMATOCRIT: 33.4 % — AB (ref 36.0–46.0)
Hemoglobin: 11.3 g/dL — ABNORMAL LOW (ref 12.0–15.0)
MCH: 28.3 pg (ref 26.0–34.0)
MCHC: 33.8 g/dL (ref 30.0–36.0)
MCV: 83.5 fL (ref 78.0–100.0)
PLATELETS: 168 10*3/uL (ref 150–400)
RBC: 4 MIL/uL (ref 3.87–5.11)
RDW: 15.8 % — AB (ref 11.5–15.5)
WBC: 12.6 10*3/uL — ABNORMAL HIGH (ref 4.0–10.5)

## 2013-11-09 LAB — MAGNESIUM: MAGNESIUM: 1.8 mg/dL (ref 1.5–2.5)

## 2013-11-09 MED ORDER — POTASSIUM CHLORIDE ER 10 MEQ PO TBCR
40.0000 meq | EXTENDED_RELEASE_TABLET | Freq: Every day | ORAL | Status: DC
  Administered 2013-11-09 – 2013-11-10 (×2): 40 meq via ORAL
  Filled 2013-11-09 (×3): qty 4

## 2013-11-09 MED ORDER — DOFETILIDE 250 MCG PO CAPS
250.0000 ug | ORAL_CAPSULE | Freq: Two times a day (BID) | ORAL | Status: DC
  Administered 2013-11-10: 250 ug via ORAL
  Filled 2013-11-09 (×3): qty 1

## 2013-11-09 MED ORDER — DOFETILIDE 250 MCG PO CAPS
375.0000 ug | ORAL_CAPSULE | Freq: Two times a day (BID) | ORAL | Status: DC
  Administered 2013-11-09: 375 ug via ORAL
  Filled 2013-11-09 (×3): qty 1

## 2013-11-09 MED ORDER — CITALOPRAM HYDROBROMIDE 10 MG PO TABS
10.0000 mg | ORAL_TABLET | Freq: Every day | ORAL | Status: DC
  Administered 2013-11-09: 10 mg via ORAL
  Filled 2013-11-09 (×2): qty 1

## 2013-11-09 NOTE — Care Management Note (Unsigned)
    Page 1 of 1   11/09/2013     4:11:30 PM CARE MANAGEMENT NOTE 11/09/2013  Patient:  Kristine [name], Kristine [name]   Account Number:  [id number]  Date Initiated:  11/09/2013  Documentation initiated by:  Nattaly Yebra  Subjective/Objective Assessment:   Pt adm with Afib/flutter for Tikosyn loading on 11/08/13. PTA, pt independent, lives with spouse.     Action/Plan:   CM consult for Tikosyn   Anticipated DC Date:  11/11/2013   Anticipated DC Plan:  [place]  CM consult  Medication Assistance      Choice offered to / List presented to:             Status of service:  In process, will continue to follow Medicare Important Message given?   (If response is "NO", the following Medicare IM given date fields will be blank) Date Medicare IM given:   Medicare IM given by:   Date Additional Medicare IM given:   Additional Medicare IM given by:    Discharge Disposition:    Per UR Regulation:  Reviewed for med. necessity/level of care/duration of stay  If discussed at [place] of Stay Meetings, dates discussed:    Comments:   11/10/13 Ellan Lambert, RN, BSN [phone]  PT COPAY WILL BE $50- [place] NOT REQUIRED  11/09/13 Ellan Lambert, RN, BSN [phone] Will check insurance coverage for Tikosyn.  Called pt's pharmacy, [facility] in [place]:  they do dispense Tikosyn, but do not have dose in stock, and will have to order.  This will take 1-2 days after Rx received.  Pt will need one week's supply of med at dc to be filled by main pharmacy prior to dc.  MD:  please write 2 Rx for Tikosyn at dc; one with refills, and one for a week's supply.  Thank you.

## 2013-11-09 NOTE — Progress Notes (Signed)
Dr Rayann Heman reviewed QTc - plan to hold Tikosyn tonight and resume tomorrow morning at 261mcg twice daily.  Pt aware of plan.  Chanetta Marshall, Therapist, sports, BSN

## 2013-11-09 NOTE — Progress Notes (Addendum)
Paged Museum/gallery conservator, RN to notify of latest [phone]; Amber said she would discuss with Allred; Per verbal order give PM dose of Tikosyn unless a new order is placed in the computer.  Amber, RN came to unit and gave verbal order to hold PM Tikosyn, Pt will restart with AM dose-dose reduced to 28mcg.  Rowe Pavy, RN

## 2013-11-09 NOTE — Progress Notes (Signed)
Inpatient Diabetes Program Recommendations  AACE/ADA: New Consensus Statement on Inpatient Glycemic Control (2013)  Target Ranges:  Prepandial:   less than 140 mg/dL      Peak postprandial:   less than 180 mg/dL (1-2 hours)      Critically ill patients:  140 - 180 mg/dL  Results for RIZA, SHILLINGS (MRN [id number]) as of 11/09/2013 14:40  Ref. Range 11/08/2013 16:10 11/08/2013 20:52 11/09/2013 06:20 11/09/2013 11:24  Glucose-Capillary Latest Range: 70-99 mg/dL 152 (H) 224 (H) 155 (H) 210 (H)   Inpatient Diabetes Program Recommendations Correction (SSI): add Novolog Moderate scale TID per Glycemic Control Order-set Thank you  Raoul Pitch BSN, RN,CDE Inpatient Diabetes Coordinator [phone] (team pager)

## 2013-11-09 NOTE — Progress Notes (Signed)
d 

## 2013-11-09 NOTE — Progress Notes (Addendum)
SUBJECTIVE: The patient is doing well today.  At this time, she denies chest pain, shortness of breath, or any new concerns.  Admitted 11-08-13 for Tikosyn loading, converted to SR last night after 1 dose.  K 3.8, Mg 1.8 this morning.  QTc 508 msec manually  CURRENT MEDICATIONS: . citalopram  20 mg Oral Daily  . dofetilide  500 mcg Oral BID  . insulin glargine  41 Units Subcutaneous QHS  . insulin glargine  45 Units Subcutaneous q morning - 10a  . losartan  100 mg Oral Daily  . metFORMIN  1,000 mg Oral Q breakfast  . metFORMIN  500 mg Oral Q supper  . metoprolol succinate  50 mg Oral Daily  . pantoprazole  40 mg Oral Daily  . potassium chloride  20 mEq Oral Daily  . rivaroxaban  20 mg Oral Q supper  . sodium chloride  3 mL Intravenous Q12H  . Vitamin D (Ergocalciferol)  50,000 Units Oral Q7 days      OBJECTIVE: Physical Exam: Filed Vitals:   11/08/13 1424 11/08/13 2019 11/09/13 0559  BP: 134/84 130/83 116/63  Pulse: 92 93 66  Temp: 98.3 F (36.8 C) 98.5 F (36.9 C) 97.8 F (36.6 C)  TempSrc: Oral Oral Oral  Resp:  18 18  Height: 5\' 9"  (1.753 m)    Weight: 206 lb 12.7 oz (93.8 kg)    SpO2: 99% 98% 98%    Intake/Output Summary (Last 24 hours) at 11/09/13 [id number] Last data filed at 11/08/13 1847  Gross per 24 hour  Intake    360 ml  Output    201 ml  Net    159 ml    Telemetry reveals atrial fibrillation with conversion to SR, multifocal PVC's  GEN- The patient is well appearing, alert and oriented x 3 today.   Head- normocephalic, atraumatic Eyes-  Sclera clear, conjunctiva pink Ears- hearing intact Oropharynx- clear Neck- supple,  Lungs- Clear to ausculation bilaterally, normal work of breathing Heart- Regular rate and rhythm  GI- soft, NT, ND, + BS Extremities- no clubbing, cyanosis, or edema Neuro- strength and sensation are intact  LABS: Basic Metabolic Panel:  Recent Labs  11/08/13 0852  11/08/13 1907 11/09/13 0400  NA  --   < > 137 137    K  --   < > 4.2 3.8  CL  --   < > 93* 96  CO2  --   < > 29 26  GLUCOSE  --   < > 248* 178*  BUN  --   < > 30* 30*  CREATININE  --   < > 1.26* 1.11*  CALCIUM  --   < > 9.1 8.9  MG 1.8  --   --  1.8  < > = values in this interval not displayed. CBC:  Recent Labs  11/09/13 0400  WBC 12.6*  HGB 11.3*  HCT 33.4*  MCV 83.5  PLT 168     ASSESSMENT AND PLAN:  Active Problems:   Atrial fibrillation  1. Longstanding persistent afib  Continue xarelto  Qt is a little long this am.  Will decrease tikosyn to 375 mcg BID  2. Hypertensive cardiovascular disease  Stable  No change required today   3. Obesity  Body mass index is 32.03 kg/(m^2).  Weight loss is strongly encouraged   4. Diastolic dysfunction  Per labs appears dry  Will hold lasix and monitor   5. Hypokalemia  Will supplement  6. Depression/anxiety Very  stable symptoms Will decrease celexa to 10mg  daily as it affects qt.

## 2013-11-10 LAB — GLUCOSE, CAPILLARY
GLUCOSE-CAPILLARY: 227 mg/dL — AB (ref 70–99)
Glucose-Capillary: 174 mg/dL — ABNORMAL HIGH (ref 70–99)
Glucose-Capillary: 255 mg/dL — ABNORMAL HIGH (ref 70–99)
Glucose-Capillary: 217 mg/dL — ABNORMAL HIGH (ref 70–99)

## 2013-11-10 LAB — BASIC METABOLIC PANEL
Anion gap: 10 (ref 5–15)
BUN: 32 mg/dL — AB (ref 6–23)
CO2: 30 meq/L (ref 19–32)
CREATININE: 1.28 mg/dL — AB (ref 0.50–1.10)
Calcium: 8.9 mg/dL (ref 8.4–10.5)
Chloride: 97 mEq/L (ref 96–112)
GFR calc Af Amer: 49 mL/min — ABNORMAL LOW (ref >90)
GFR calc non Af Amer: 43 mL/min — ABNORMAL LOW (ref >90)
Glucose, Bld: 176 mg/dL — ABNORMAL HIGH (ref 70–99)
Potassium: 4 mEq/L (ref 3.7–5.3)
Sodium: 137 mEq/L (ref 137–147)

## 2013-11-10 LAB — MAGNESIUM: Magnesium: 1.9 mg/dL (ref 1.5–2.5)

## 2013-11-10 MED ORDER — SODIUM CHLORIDE 0.9 % IV SOLN
INTRAVENOUS | Status: AC
  Administered 2013-11-10: 09:00:00 via INTRAVENOUS

## 2013-11-10 MED ORDER — DOFETILIDE 125 MCG PO CAPS
125.0000 ug | ORAL_CAPSULE | Freq: Two times a day (BID) | ORAL | Status: DC
  Filled 2013-11-10 (×4): qty 1

## 2013-11-10 NOTE — Progress Notes (Signed)
    SUBJECTIVE: The patient is doing well today.  At this time, she denies chest pain, shortness of breath, or any new concerns.  Admitted 11-08-13 for Tikosyn loading, converted to SR 1 dose.  QTc prolonged, Tikosyn held last night.  Qtc today is 490 msec.   CURRENT MEDICATIONS: . citalopram  10 mg Oral Daily  . dofetilide  250 mcg Oral BID  . insulin glargine  41 Units Subcutaneous QHS  . insulin glargine  45 Units Subcutaneous q morning - 10a  . losartan  100 mg Oral Daily  . metFORMIN  1,000 mg Oral Q breakfast  . metFORMIN  500 mg Oral Q supper  . metoprolol succinate  50 mg Oral Daily  . pantoprazole  40 mg Oral Daily  . potassium chloride  40 mEq Oral Daily  . rivaroxaban  20 mg Oral Q supper  . sodium chloride  3 mL Intravenous Q12H  . Vitamin D (Ergocalciferol)  50,000 Units Oral Q7 days      OBJECTIVE: Physical Exam: Filed Vitals:   11/08/13 1424 11/08/13 2019 11/09/13 0559 11/10/13 0610  BP: 134/84 130/83 116/63 133/66  Pulse: 92 93 66 64  Temp: 98.3 F (36.8 C) 98.5 F (36.9 C) 97.8 F (36.6 C) 97.5 F (36.4 C)  TempSrc: Oral Oral Oral Oral  Resp:  18 18 18   Height: 5\' 9"  (1.753 m)     Weight: 206 lb 12.7 oz (93.8 kg)     SpO2: 99% 98% 98% 96%    Intake/Output Summary (Last 24 hours) at 11/10/13 0731 Last data filed at 11/09/13 1718  Gross per 24 hour  Intake    240 ml  Output      0 ml  Net    240 ml    Telemetry reveals sinus rhythm, decreased PVC's  GEN- The patient is well appearing, alert and oriented x 3 today.   Head- normocephalic, atraumatic Eyes-  Sclera clear, conjunctiva pink Ears- hearing intact Oropharynx- clear Neck- supple,  Lungs- Clear to ausculation bilaterally, normal work of breathing Heart- Regular rate and rhythm  GI- soft, NT, ND, + BS Extremities- no clubbing, cyanosis, or edema Neuro- strength and sensation are intact  LABS: Basic Metabolic Panel:  Recent Labs  11/09/13 0400 11/10/13 0500  NA 137 137  K 3.8  4.0  CL 96 97  CO2 26 30  GLUCOSE 178* 176*  BUN 30* 32*  CREATININE 1.11* 1.28*  CALCIUM 8.9 8.9  MG 1.8 1.9   CBC:  Recent Labs  11/09/13 0400  WBC 12.6*  HGB 11.3*  HCT 33.4*  MCV 83.5  PLT 168     ASSESSMENT AND PLAN:  Active Problems:   Atrial fibrillation  1. Longstanding persistent afib  Continue xarelto  Qt is better this am Stop celexa and give IVF to improve renal function Resume tikosyn 250 mcg BID today and follow QT closely.  Pt is aware of risks of arrhythmias with tikosyn.  2. Hypertensive cardiovascular disease  Stable  No change required today   3. Obesity  Body mass index is 32.03 kg/(m^2).  Weight loss is strongly encouraged   4. Diastolic dysfunction  Per labs appears dry  Will hold lasix and give IVF today.  5. Hypokalemia  Will supplement  6. Depression/anxiety Very stable symptoms Will stop celexa

## 2013-11-10 NOTE — Progress Notes (Signed)
Dr. Rayann Heman made aware of  EKG post Tikosyn dose from this am. Will be up to evaluate EKG. Will continue to monitor patient. Malikah Lakey, Bettina Gavia RN

## 2013-11-10 NOTE — Progress Notes (Signed)
Inpatient Diabetes Program Recommendations  AACE/ADA: New Consensus Statement on Inpatient Glycemic Control (2013)  Target Ranges:  Prepandial:   less than 140 mg/dL      Peak postprandial:   less than 180 mg/dL (1-2 hours)      Critically ill patients:  140 - 180 mg/dL  Results for JOSSELINE, PARKHILL (MRN [id number]) as of 11/10/2013 13:57  Ref. Range 11/09/2013 11:24 11/09/2013 16:25 11/09/2013 21:34 11/10/2013 06:06 11/10/2013 11:30  Glucose-Capillary Latest Range: 70-99 mg/dL 210 (H) 258 (H) 243 (H) 174 (H) 227 (H)   Inpatient Diabetes Program Recommendations Correction (SSI): add Novolog Moderate scale TID per Glycemic Control Order-set Thank you  Raoul Pitch BSN, RN,CDE Inpatient Diabetes Coordinator [phone] (team pager)

## 2013-11-10 NOTE — Progress Notes (Signed)
QTc is now 509 msec. I will decrease tikosyn to 125 mcg BID and follow closely. She is receiving IVF for prerenal azotemia.

## 2013-11-11 ENCOUNTER — Other Ambulatory Visit: Payer: Self-pay | Admitting: *Deleted

## 2013-11-11 ENCOUNTER — Other Ambulatory Visit: Payer: Self-pay | Admitting: Physician Assistant

## 2013-11-11 DIAGNOSIS — I480 Paroxysmal atrial fibrillation: Secondary | ICD-10-CM

## 2013-11-11 DIAGNOSIS — I119 Hypertensive heart disease without heart failure: Secondary | ICD-10-CM

## 2013-11-11 DIAGNOSIS — R9431 Abnormal electrocardiogram [ECG] [EKG]: Secondary | ICD-10-CM

## 2013-11-11 DIAGNOSIS — I4819 Other persistent atrial fibrillation: Secondary | ICD-10-CM

## 2013-11-11 LAB — URINALYSIS, ROUTINE W REFLEX MICROSCOPIC
Bilirubin Urine: NEGATIVE
Glucose, UA: NEGATIVE mg/dL
Ketones, ur: NEGATIVE mg/dL
Leukocytes, UA: NEGATIVE
Nitrite: NEGATIVE
Protein, ur: NEGATIVE mg/dL
Specific Gravity, Urine: 1.016 (ref 1.005–1.030)
UROBILINOGEN UA: 1 mg/dL (ref 0.0–1.0)
pH: 5.5 (ref 5.0–8.0)

## 2013-11-11 LAB — BASIC METABOLIC PANEL
ANION GAP: 13 (ref 5–15)
Anion gap: 14 (ref 5–15)
BUN: 26 mg/dL — ABNORMAL HIGH (ref 6–23)
BUN: 24 mg/dL — ABNORMAL HIGH (ref 6–23)
CALCIUM: 8.9 mg/dL (ref 8.4–10.5)
CHLORIDE: 101 meq/L (ref 96–112)
CO2: 22 meq/L (ref 19–32)
CO2: 24 meq/L (ref 19–32)
Calcium: 9.1 mg/dL (ref 8.4–10.5)
Chloride: 99 mEq/L (ref 96–112)
Creatinine, Ser: 1.08 mg/dL (ref 0.50–1.10)
Creatinine, Ser: 1.08 mg/dL (ref 0.50–1.10)
GFR calc Af Amer: 61 mL/min — ABNORMAL LOW (ref >90)
GFR calc non Af Amer: 52 mL/min — ABNORMAL LOW (ref >90)
GFR calc non Af Amer: 52 mL/min — ABNORMAL LOW (ref >90)
GFR, EST AFRICAN AMERICAN: 61 mL/min — AB (ref >90)
GLUCOSE: 231 mg/dL — AB (ref 70–99)
Glucose, Bld: 141 mg/dL — ABNORMAL HIGH (ref 70–99)
POTASSIUM: 6.4 meq/L — AB (ref 3.7–5.3)
Potassium: 4.8 mEq/L (ref 3.7–5.3)
Sodium: 136 mEq/L — ABNORMAL LOW (ref 137–147)
Sodium: 137 mEq/L (ref 137–147)

## 2013-11-11 LAB — MAGNESIUM: MAGNESIUM: 2.1 mg/dL (ref 1.5–2.5)

## 2013-11-11 LAB — GLUCOSE, CAPILLARY
GLUCOSE-CAPILLARY: 185 mg/dL — AB (ref 70–99)
Glucose-Capillary: 133 mg/dL — ABNORMAL HIGH (ref 70–99)

## 2013-11-11 LAB — URINE MICROSCOPIC-ADD ON

## 2013-11-11 MED ORDER — CITALOPRAM HYDROBROMIDE 10 MG PO TABS: 10.0000 mg | ORAL_TABLET | Freq: Every day | ORAL | Status: DC

## 2013-11-11 NOTE — Progress Notes (Signed)
EKG obtained this evening shortly after shift change as instructed by Dr. Rayann Heman.  QTc 525 and given instructions to hold this evening's dose of Tikosyn per Dr. Rayann Heman and to obtain EKG at 0730 in the morning.  Will continue to monitor.  Kristine [name]

## 2013-11-11 NOTE — Discharge Summary (Signed)
Discharge Summary   Patient ID: Kristine [name] MRN: [id number], DOB/AGE: 12-19-1945 68 y.o. Admit date: 11/08/2013 D/C date:     11/11/2013  Primary Cardiologist: Dr. Acie Fredrickson and Dr. Rayann Heman (EP)  Principal Problem:   Atrial fibrillation Active Problems:   DM   Hypertensive cardiovascular disease   DYSPNEA ON EXERTION   High cholesterol   Anxiety   Osteoarthritis   Mild aortic sclerosis   Degenerative disc disease   Diastolic dysfunction with chronic heart failure   Obesity   Admission Dates: 11/08/13-11/11/13 Discharge Diagnosis: persistent afib s/p failed Tikosyn loading due to prolonged QTc  HPI: Kristine [name] is a 68 y.o. female with a h/o obesity, DM, HTN, and persistent afib who presented to [facility] on 11/08/13 for initiation of Tikosyn.  She reports being in good health until 2011 when she developed pneumonia. She says that while receiving a breathing treatment that she developed rapid irregular palpitations and had been in atrial fibrillation since that time. An attempt at cardioversion was made about 3 years ago which was unsuccessful and had been treated with rate control since that time.  She had difficulty with diastolic dysfunction which is felt to be due to her afib. She has rare palpitations but is frequently "aware of her heart". She has dypsnea with moderate activity. She feels that abdominal distension is her primary symptom with CHF.  Digoxin was stopped prior toTikosyn load and Toprol XL 50mg  restarted.   Hospital Course  Longstanding persistent afib - prior to admission her digoxin was discontinued prior to initiation of Tikosyn.  -- Admitted 11-08-13 for Tikosyn loading and converted to SR after 1 dose of 500 mcg. Her QT was noted to be slightly long at 508 so her dose of Tikosyn was decreased to 375 mcg BID the following day. After one dose of 375 mg her QTc climbed 561msec and her nightly dose was held. It was planned to resume the following morning at 284mcg  BID. Her QTc improved to 490 msec and this dosing was continued; however, her QTc climbed back to 509 msec and the Tiksoyn was decreased once again to 177mcg BID. Her QTc was noted to be 525 msec on 11/10/13 and it was decided to discontinue Tikosyn completely.  -- Therapeutic strategies for afib including flecainide and amiodarone were discussed in detail with Dr. Rayann Heman today. She does not wish to take amiodarone due to concerns of side effects but would be willing to take flecainide. She had a cath in 2011 which was normal (this was preceeded by [facility] with breast attenuation). Some T wave changes were noted on ECG with initiation of tikosyn so it was decided to repeat outpatient lexiscan myoview prior to initiation of flecainide.  If lexiscan is low risk then plan to start flecainide 50mg  BID. -- Continued on xarelto and Toprol XL 50mg  -- Will follow up in afib clinic with Dr. Rayann Heman or Butch Penny.  AKI- patient's creatinine was noted to be elevated at 1.28 . This improved after IVFs -- Continue to hold Lasix.  -- Creat 1.08 on discharge  Hypertensive cardiovascular disease  -- Stable   Diastolic dysfunction  -- Thought to be exacerbated by persistent afib. Hopefully, this will improve with restoration of NSR.  -- Continue to hold lasix   Hypokalemia - resolved after supplementation. She was over-supplemented initially with K >6 and this was discontinued.  --  K 4.8 on the day of discharge.   Depression/anxiety  -- Very stable symptoms  --  Her celexa was discontinued with initiation of Tikosyn as it can prolong QT interval. However, because she will not be continuing with Tikosyn she was instructed to restart half her dose of Celexa (10mg ) in 48 hours. -- Continue Celexa 10mg  qd.  Obesity  -- Body mass index is 32.03 kg/(m^2).  -- Weight loss was strongly encouraged   The patient has had an uncomplicated hospital course and is recovering well. She has been seen by Dr. Rayann Heman today and  deemed ready for discharge home. All follow-up appointments have been scheduled.  Discharge medications are listed below.    Discharge Vitals: Blood pressure 145/63, pulse 69, temperature 97.8 F (36.6 C), temperature source Oral, resp. rate 18, height 5\' 9"  (1.753 m), weight 206 lb 12.7 oz (93.8 kg), SpO2 97.00%.  Labs: Lab Results  Component Value Date   WBC 12.6* 11/09/2013   HGB 11.3* 11/09/2013   HCT 33.4* 11/09/2013   MCV 83.5 11/09/2013   PLT 168 11/09/2013     Recent Labs Lab 11/11/13 0936  NA 137  K 4.8  CL 99  CO2 24  BUN 24*  CREATININE 1.08  CALCIUM 8.9  GLUCOSE 231*    Diagnostic Studies/Procedures   No results found.  Discharge Medications     Medication List    STOP taking these medications       furosemide 80 MG tablet  Commonly known as:  LASIX     KLOR-CON 10 10 MEQ tablet  Generic drug:  potassium chloride     metolazone 2.5 MG tablet  Commonly known as:  ZAROXOLYN      TAKE these medications       citalopram 10 MG tablet  Commonly known as:  CELEXA  Take 1 tablet (10 mg total) by mouth daily.  Start taking on:  11/13/2013     esomeprazole 40 MG capsule  Commonly known as:  NEXIUM  Take 40 mg by mouth daily as needed (for acid reflux).     insulin glargine 100 UNIT/ML injection  Commonly known as:  LANTUS  Inject 41-45 Units into the skin 2 (two) times daily. Takes 45 units in the morning and 41 units at night     losartan 100 MG tablet  Commonly known as:  COZAAR  Take 1 tablet (100 mg total) by mouth daily.     metFORMIN 500 MG tablet  Commonly known as:  GLUCOPHAGE  Take 500-1,000 mg by mouth 2 (two) times daily with a meal. 1000 mg in the AM and 500mg  at night     metoprolol succinate 50 MG 24 hr tablet  Commonly known as:  TOPROL-XL  Take 1 tablet (50 mg total) by mouth daily. Take with or immediately following a meal.     rivaroxaban 20 MG Tabs tablet  Commonly known as:  XARELTO  Take 20 mg by mouth every morning.       Vitamin D (Ergocalciferol) 50000 UNITS Caps capsule  Commonly known as:  DRISDOL  Take 50,000 Units by mouth every 7 (seven) days. Takes on Thursday     zolpidem 10 MG tablet  Commonly known as:  AMBIEN  Take 10 mg by mouth at bedtime as needed. For sleep        Disposition   The patient will be discharged in stable condition to home.  Follow-up Information   Follow up with Thompson Grayer, MD On 12/26/2013. (The office will call  you to make an appointment in the afib clinic with Roderic Palau  in two weeks ( 1 week after your nuclear stress test))    Specialty:  Cardiology   Contact information:   [place] [id number] [phone]       Follow up with Ricardo On 11/14/2013. ([address] for your nuclear stress test. THe office will call to schedule this for next week)    Contact information:   [place] [id number] [phone]        Duration of Discharge Encounter: Greater than 30 minutes including physician and PA time.  SignedVertell Limber, KATHRYN PA-C 11/11/2013, 1:49 PM   Thompson Grayer MD

## 2013-11-11 NOTE — Progress Notes (Signed)
SUBJECTIVE: The patient is doing well today.  At this time, she denies chest pain, shortness of breath, or any new concerns.  Admitted 11-08-13 for Tikosyn loading, converted to SR 1 dose.  QTc prolonged, Tikosyn held last night.  Qtc today is 565msec.  K 6.4 this morning, creatinine improved.    CURRENT MEDICATIONS: . dofetilide  125 mcg Oral BID  . insulin glargine  41 Units Subcutaneous QHS  . insulin glargine  45 Units Subcutaneous q morning - 10a  . losartan  100 mg Oral Daily  . metFORMIN  1,000 mg Oral Q breakfast  . metFORMIN  500 mg Oral Q supper  . metoprolol succinate  50 mg Oral Daily  . pantoprazole  40 mg Oral Daily  . rivaroxaban  20 mg Oral Q supper  . sodium chloride  3 mL Intravenous Q12H  . Vitamin D (Ergocalciferol)  50,000 Units Oral Q7 days      OBJECTIVE: Physical Exam: Filed Vitals:   11/10/13 0610 11/10/13 1012 11/10/13 1249 11/11/13 0637  BP: 133/66 124/65 136/66 145/63  Pulse: 64 67 66 69  Temp: 97.5 F (36.4 C)  98 F (36.7 C) 97.8 F (36.6 C)  TempSrc: Oral  Oral Oral  Resp: 18  18 18   Height:      Weight:      SpO2: 96%  97% 97%    Intake/Output Summary (Last 24 hours) at 11/11/13 0843 Last data filed at 11/10/13 1700  Gross per 24 hour  Intake 1602.08 ml  Output    201 ml  Net 1401.08 ml    Telemetry reveals sinus rhythm  GEN- The patient is well appearing, alert and oriented x 3 today.   Head- normocephalic, atraumatic Eyes-  Sclera clear, conjunctiva pink Ears- hearing intact Oropharynx- clear Neck- supple,  Lungs- Clear to ausculation bilaterally, normal work of breathing Heart- Regular rate and rhythm  GI- soft, NT, ND, + BS Extremities- no clubbing, cyanosis, or edema Neuro- strength and sensation are intact  LABS: Basic Metabolic Panel:  Recent Labs  11/10/13 0500 11/11/13 0430  NA 137 136*  K 4.0 6.4*  CL 97 101  CO2 30 22  GLUCOSE 176* 141*  BUN 32* 26*  CREATININE 1.28* 1.08  CALCIUM 8.9 9.1  MG  1.9 2.1   CBC:  Recent Labs  11/09/13 0400  WBC 12.6*  HGB 11.3*  HCT 33.4*  MCV 83.5  PLT 168     ASSESSMENT AND PLAN:  Active Problems:   Atrial fibrillation  1. Longstanding persistent afib  Continue xarelto  Qt is too long with tikosyn Stop tikosyn Therapeutic strategies for afib including flecainide and amiodarone were discussed in detail with the patient today. SHe does not wish to take amiodarone due to concerns of side effects but would be willing to take flecainide. She had a cath in 2011 which was normal (this was preceeded by [facility] with breast attenuation).  Given t wave changes here (began with tikosyn), will repeat outpatient lexiscan myoview prior to initiation of flecainide. If lexi is low risk then start flecainide 50mg  BID.  2. Hypertensive cardiovascular disease  Stable  No change required today   3. Obesity  Body mass index is 32.03 kg/(m^2).  Weight loss is strongly encouraged   4. Diastolic dysfunction  Per labs appears dry  Will hold lasix at discharge  5. Hyperkalemia Repeat labs this am Stop supplementation  6. Depression/anxiety Very stable symptoms Will resume celexa at 10mg  daily in 72  hours.  She really does not wish to take this long term.  I will defer to primary care  Possible discharge later today vs in am depending on K

## 2013-11-12 ENCOUNTER — Other Ambulatory Visit: Payer: Self-pay | Admitting: Cardiovascular Disease

## 2013-11-15 ENCOUNTER — Telehealth: Payer: Self-pay | Admitting: Internal Medicine

## 2013-11-15 DIAGNOSIS — I4819 Other persistent atrial fibrillation: Secondary | ICD-10-CM

## 2013-11-15 NOTE — Telephone Encounter (Signed)
New information:     Pt would like a call back about her medications please.

## 2013-11-15 NOTE — Telephone Encounter (Addendum)
She took 40mg  of Furosemide Mon and Tues and 1meq of Potassium  Last night she went back to 80mg  of Furosemide and 63meq of Potassium.  She restarted it due to 18 pound weight gain over a week.  Today weight is down 3 pounds today.  She is coming in tomorrow for her stress test and we will get a BMP.  Will forward to Dr Rayann Heman for review   HOSP NOTE prior to admission her digoxin was discontinued prior to initiation of Tikosyn.  -- Admitted 11-08-13 for Tikosyn loading and converted to SR after 1 dose of 500 mcg. Her QT was noted to be slightly long at 508 so her dose of Tikosyn was decreased to 375 mcg BID the following day. After one dose of 375 mg her QTc climbed 574msec and her nightly dose was held. It was planned to resume the following morning at 231mcg BID. Her QTc improved to 490 msec and this dosing was continued; however, her QTc climbed back to 509 msec and the Tiksoyn was decreased once again to 19mcg BID. Her QTc was noted to be 525 msec on 11/10/13 and it was decided to discontinue Tikosyn completely.  -- Therapeutic strategies for afib including flecainide and amiodarone were discussed in detail with Dr. Rayann Heman today. She does not wish to take amiodarone due to concerns of side effects but would be willing to take flecainide. She had a cath in 2011 which was normal (this was preceeded by [facility] with breast attenuation). Some T wave changes were noted on ECG with initiation of tikosyn so it was decided to repeat outpatient lexiscan myoview prior to initiation of flecainide.  If lexiscan is low risk then plan to start flecainide 50mg  BID.  -- Continued on xarelto and Toprol XL 50mg   -- Will follow up in afib clinic with Dr. Rayann Heman or Butch Penny.

## 2013-11-15 NOTE — Telephone Encounter (Signed)
She was very dry in the hospital and required IVF.  I would not go up that much on her lasix. Schedule follow-up with a NP/PA for further evaluation of volume status.

## 2013-11-16 ENCOUNTER — Ambulatory Visit (HOSPITAL_COMMUNITY): Payer: Medicare Other | Attending: Cardiovascular Disease | Admitting: Radiology

## 2013-11-16 ENCOUNTER — Encounter (HOSPITAL_COMMUNITY): Payer: Self-pay

## 2013-11-16 ENCOUNTER — Other Ambulatory Visit: Payer: Self-pay

## 2013-11-16 ENCOUNTER — Other Ambulatory Visit (INDEPENDENT_AMBULATORY_CARE_PROVIDER_SITE_OTHER): Payer: Medicare Other

## 2013-11-16 VITALS — BP 190/105 | HR 66 | Ht 69.0 in | Wt 232.0 lb

## 2013-11-16 DIAGNOSIS — R0602 Shortness of breath: Secondary | ICD-10-CM

## 2013-11-16 DIAGNOSIS — E119 Type 2 diabetes mellitus without complications: Secondary | ICD-10-CM | POA: Insufficient documentation

## 2013-11-16 DIAGNOSIS — R9431 Abnormal electrocardiogram [ECG] [EKG]: Secondary | ICD-10-CM | POA: Insufficient documentation

## 2013-11-16 DIAGNOSIS — I4819 Other persistent atrial fibrillation: Secondary | ICD-10-CM

## 2013-11-16 DIAGNOSIS — I4891 Unspecified atrial fibrillation: Secondary | ICD-10-CM

## 2013-11-16 DIAGNOSIS — E785 Hyperlipidemia, unspecified: Secondary | ICD-10-CM | POA: Insufficient documentation

## 2013-11-16 DIAGNOSIS — I10 Essential (primary) hypertension: Secondary | ICD-10-CM | POA: Diagnosis not present

## 2013-11-16 LAB — BASIC METABOLIC PANEL
BUN: 16 mg/dL (ref 6–23)
CHLORIDE: 102 meq/L (ref 96–112)
CO2: 27 meq/L (ref 19–32)
Calcium: 9.2 mg/dL (ref 8.4–10.5)
Creatinine, Ser: 1 mg/dL (ref 0.4–1.2)
GFR: 60.21 mL/min (ref >60.00)
Glucose, Bld: 128 mg/dL — ABNORMAL HIGH (ref 70–99)
Potassium: 3.9 mEq/L (ref 3.5–5.1)
Sodium: 136 mEq/L (ref 135–145)

## 2013-11-16 MED ORDER — TECHNETIUM TC 99M SESTAMIBI GENERIC - CARDIOLITE
33.0000 | Freq: Once | INTRAVENOUS | Status: AC | PRN
  Administered 2013-11-16: 33 via INTRAVENOUS

## 2013-11-16 MED ORDER — REGADENOSON 0.4 MG/5ML IV SOLN
0.4000 mg | Freq: Once | INTRAVENOUS | Status: AC
  Administered 2013-11-16: 0.4 mg via INTRAVENOUS

## 2013-11-16 MED ORDER — TECHNETIUM TC 99M SESTAMIBI GENERIC - CARDIOLITE
11.0000 | Freq: Once | INTRAVENOUS | Status: AC | PRN
  Administered 2013-11-16: 11 via INTRAVENOUS

## 2013-11-16 NOTE — Progress Notes (Signed)
[facility] [address] [place], [place] [address] [phone]    Cardiology Nuclear Med Study  Kristine [name] is a 68 y.o. female     MRN : [id number]     DOB: Jan 20, 1946  Procedure Date: 11/16/2013  Nuclear Med Background Indication for Stress Test:  Evaluation for Ischemia, and Patient seen in hospital on 11-11-2013 for AFIB History:  No known CAD, Afib, Echo 2015 EF 50%, MPI 2011 EF 70% Cardiac Risk Factors: Hypertension, IDDM, and Lipids  Symptoms:  Dizziness, DOE and Palpitations   Nuclear Pre-Procedure Caffeine/Decaff Intake:  None> 12 hrs NPO After: 9:30pm   Lungs:  clear O2 Sat: 96% on room air. IV 0.9% NS with Angio Cath:  22g  IV Site: R Hand x 1, tolerated well IV Started by:  Irven Baltimore, RN  Chest Size (in):  44 Cup Size: C  Height: 5\' 9"  (1.753 m)  Weight:  232 lb (105.235 kg)  BMI:  Body mass index is 34.24 kg/(m^2). Tech Comments:  Fasting CBG was 148 at 0550 today. 1/2 dose of Lantus Insulin last night, no insulin today.Patient took Metformin and Toprol this am. Irven Baltimore, RN.    Nuclear Med Study 1 or 2 day study: 1 day  Stress Test Type:  Lexiscan  Reading MD: N/A  Order Authorizing Provider:  Thompson Grayer, MD  Resting Radionuclide: Technetium 75m Sestamibi  Resting Radionuclide Dose: 11.0 mCi   Stress Radionuclide:  Technetium 88m Sestamibi  Stress Radionuclide Dose: 33.0 mCi           Stress Protocol Rest HR: 66 Stress HR: 82  Rest BP: 190/105 Stress BP: 186/87  Exercise Time (min): n/a METS: n/a           Dose of Adenosine (mg):  n/a Dose of Lexiscan: 0.4 mg  Dose of Atropine (mg): n/a Dose of Dobutamine: n/a mcg/kg/min (at max HR)  Stress Test Technologist: Glade Lloyd, BS-ES  Nuclear Technologist:  Vedia Pereyra, CNMT     Rest Procedure:  Myocardial perfusion imaging was performed at rest 45 minutes following the intravenous administration of Technetium 49m Sestamibi. Rest ECG: NSR - Normal  EKG  Stress Procedure:  The patient received IV Lexiscan 0.4 mg over 15-seconds.  Technetium 33m Sestamibi injected at 30-seconds.  Quantitative spect images were obtained after a 45 minute delay.  During the infusion of Lexiscan the patient complained of a strange feeling, headache and nausea.  These symptoms began to resolve in recovery.  Stress ECG: No significant change from baseline ECG  QPS Raw Data Images:  Mild breast attenuation.  Normal left ventricular size. Stress Images:  There is mild attenuation of the mid anterior wall with normal uptake in the other regions.    Rest Images:  There is mild attenuation of the mid anterior wall with normal uptake in the other regions.  Subtraction (SDS):  No evidence of ischemia. Transient Ischemic Dilatation (Normal <1.22):  0.99 Lung/Heart Ratio (Normal <0.45):  0.42  Quantitative Gated Spect Images QGS EDV:  111 ml QGS ESV:  54 ml  Impression Exercise Capacity:  Lexiscan with no exercise. BP Response:  Normal blood pressure response. Clinical Symptoms:  No significant symptoms noted. ECG Impression:  No significant ST segment change suggestive of ischemia. Comparison with Prior Nuclear Study: No significant change from previous study on 11/07/09  Overall Impression:  Low risk stress nuclear study .  There is mild anterior attenuation that appears to be due to breast  artifact.  .  LV Ejection Fraction: 52%.  LV Wall Motion:  NL LV Function; NL Wall Motion.   Thayer Headings, Brooke Bonito., MD, [facility] 11/16/2013, 3:58 PM [address],  [place] Pager [phone]

## 2013-11-16 NOTE — Progress Notes (Signed)
Patient ID: Kristine [name], female   DOB: 1945/10/15, 69 y.o.   MRN: [id number] Kristine [name] here today for a Lexiscan Cardiolite. Her weight on arrival was 232 today. Last weight in Epic on 11-08-2013 was 206.79. BMET blood work draw today. Patient states that she started back on Lasix 40 mg on 11-13-13 and 40 mg on 11-14-2013, and 80mg  of Lasix yesterday and today. Dr. Thompson Grayer and Janan Halter, RN notified of weight change. Irven Baltimore, RN.

## 2013-11-20 ENCOUNTER — Ambulatory Visit (INDEPENDENT_AMBULATORY_CARE_PROVIDER_SITE_OTHER): Payer: Medicare Other | Admitting: Internal Medicine

## 2013-11-20 ENCOUNTER — Encounter: Payer: Self-pay | Admitting: Internal Medicine

## 2013-11-20 VITALS — BP 160/76 | HR 62 | Ht 65.0 in | Wt 229.6 lb

## 2013-11-20 DIAGNOSIS — I5032 Chronic diastolic (congestive) heart failure: Secondary | ICD-10-CM | POA: Diagnosis not present

## 2013-11-20 DIAGNOSIS — I4819 Other persistent atrial fibrillation: Secondary | ICD-10-CM

## 2013-11-20 DIAGNOSIS — I4891 Unspecified atrial fibrillation: Secondary | ICD-10-CM

## 2013-11-20 DIAGNOSIS — I119 Hypertensive heart disease without heart failure: Secondary | ICD-10-CM

## 2013-11-20 MED ORDER — FUROSEMIDE 80 MG PO TABS: 40.0000 mg | ORAL_TABLET | Freq: Every day | ORAL | Status: DC

## 2013-11-20 MED ORDER — FLECAINIDE ACETATE 100 MG PO TABS: 100.0000 mg | ORAL_TABLET | Freq: Two times a day (BID) | ORAL | Status: DC

## 2013-11-20 MED ORDER — POTASSIUM CHLORIDE ER 10 MEQ PO TBCR: 10.0000 meq | EXTENDED_RELEASE_TABLET | Freq: Every day | ORAL | Status: DC

## 2013-11-20 NOTE — Patient Instructions (Addendum)
Your physician recommends that you schedule a follow-up appointment in: 7-10 days for EKG and BMP nurse room, and 6 weeks with Dr Rayann Heman  Your physician has recommended you make the following change in your medication:  1) Start Flecainide 100mg  twice daily 2) Decrease Furosemide to 40mg  daily 3) Decrease Potassium to 56meq daily

## 2013-11-20 NOTE — Progress Notes (Signed)
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 She reports being in good health until 2011 when she developed pneumonia.  She says that while receiving a breathing treatment that she developed rapid irregular palpitations.  She has been in atrial fibrillation since that time.  An attempt at cardioversion was made about 3 years ago which was unsuccessful.  She has been treated with rate control since that time. She had difficulty with diastolic dysfunction which is felt to be due to her afib.  She has rare palpitations but is frequently "aware of her heart".  Her energy is "fair".  She has dypsnea with moderate activity.  She feels that abdominal distension is her primary symptom with CHF.   Treatment options were given to pt and she chose thospitalization for Tikosyn load, However drug had to be discontinued due to prolonged QT. She was then scheduled for a stress test to r/o CAD with thoughts of starting Flecainide, which showed low risk scan. She also had issues with fluid retention while off diuretic but is back to baseline with resumption of 80 mg lasix qd but would like pt now to reduce to 40 mg a day.  Her EKG does show NSR and pt reports more energy in SR.  Today, she denies symptoms of chest pain, orthopnea, PND, lower extremity edema, dizziness, presyncope, syncope, or neurologic sequela. The patient is tolerating medications without difficulties and is otherwise without complaint today.   Past Medical History  Diagnosis Date  . Persistent atrial fibrillation   . Morbid obesity   . Pneumonia 2011  . Diabetes mellitus   . HTN (hypertension)   . Abnormal cardiovascular function study     MYOVIEW SHOWING ANTERIOR WALL ATTENUATION  . High cholesterol   . Scoliosis   . Spinal stenosis   . Anxiety   . Osteoarthritis   . Osteoporosis    . Mild aortic sclerosis   . Degenerative disc disease   . Vitamin D deficiency    Past Surgical History  Procedure Laterality Date  . Finger fracture surgery    . Cardiac catheterization  11/16/09    SMOOTH AND NORMAL  . Total abdominal hysterectomy    . Laparoscopic cholecystectomy    . Bladder suspension      Current Outpatient Prescriptions  Medication Sig Dispense Refill  . citalopram (CELEXA) 10 MG tablet Take 1 tablet (10 mg total) by mouth daily.  30 tablet  11  . esomeprazole (NEXIUM) 40 MG capsule Take 40 mg by mouth daily as needed (for acid reflux).       . furosemide (LASIX) 80 MG tablet Take 0.5 tablets (40 mg total) by mouth daily.      . insulin glargine (LANTUS) 100 UNIT/ML injection Inject 41-45 Units into the skin 2 (two) times daily. Takes 45 units in the morning and 41 units at night      . losartan (COZAAR) 100 MG tablet TAKE 1 TABLET BY MOUTH EVERY DAY  90 tablet  0  . metFORMIN (GLUCOPHAGE) 500 MG tablet Take 500-1,000 mg by mouth 2 (two) times daily with a meal. 1000 mg in the AM and 549m at night      . metoprolol succinate (TOPROL-XL) 50 MG 24 hr tablet Take 1 tablet (50 mg total) by mouth daily. Take with or immediately following a meal.  30  tablet  5  . rivaroxaban (XARELTO) 20 MG TABS tablet Take 20 mg by mouth every morning.      . Vitamin D, Ergocalciferol, (DRISDOL) 50000 UNITS CAPS Take 50,000 Units by mouth every 7 (seven) days. Takes on Thursday      . zolpidem (AMBIEN) 10 MG tablet Take 10 mg by mouth at bedtime as needed. For sleep      . flecainide (TAMBOCOR) 100 MG tablet Take 1 tablet (100 mg total) by mouth 2 (two) times daily.  180 tablet  3  . potassium chloride (K-DUR) 10 MEQ tablet Take 1 tablet (10 mEq total) by mouth daily.  90 tablet  3   No current facility-administered medications for this visit.    Allergies  Allergen Reactions  . Albuterol     REACTION: Tachycardia- AFib  . Bee Venom Other (See Comments)    "makes me nervous"    . Epinephrine Other (See Comments)    Increases heart rate per patient.   Clementeen Hoof [Iodinated Diagnostic Agents] Other (See Comments)    flushing  . Penicillins     REACTION: flushing \T\ hot    History   Social History  . Marital Status: Married    Spouse Name: N/A    Number of Children: N/A  . Years of Education: N/A   Occupational History  . Not on file.   Social History Main Topics  . Smoking status: Never Smoker   . Smokeless tobacco: Never Used  . Alcohol Use: No  . Drug Use: No  . Sexual Activity: Not on file   Other Topics Concern  . Not on file   Social History Narrative   Pt lives in [place] with spouse.   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    Family History  Problem Relation Age of Onset  . Stroke Father   . Hypertension Father   . Atrial fibrillation Father     HAD MURMUR  . Heart failure Mother   . Hypertension Brother   . Hypertension Sister   . Hypertension Son     ROS- All systems are reviewed and negative except as per the HPI above  Physical Exam: Filed Vitals:   11/20/13 0943  BP: 160/76  Pulse: 62  Height: 5' 5"  (1.651 m)  Weight: 104.146 kg (229 lb 9.6 oz)    GEN- The patient is morbidly obese appearing, alert and oriented x 3 today.   Head- normocephalic, atraumatic Eyes-  Sclera clear, conjunctiva pink Ears- hearing intact Oropharynx- clear Neck- supple,  Lungs- Clear to ausculation bilaterally, normal work of breathing Heart- irregular rate and rhythm  GI- soft, NT, ND, + BS Extremities- no clubbing, cyanosis, + edema MS- no significant deformity or atrophy Skin- no rash or lesion Psych- euthymic mood, full affect Neuro- strength and sensation are intact  EKG today reveals Sr at 62 bpm with QTC at 493 ms. Myoview- low risk scan. Epic records are reviewed.      Assessment and Plan:  1.  Longstanding persistent  afib Pt did return to SR on Tikosyn but drug was stopped due to prolonged QT. Stress test low risk so will start Flecainide 50 mg bid.  2. Hypertensive cardiovascular disease  Bp elevated today. Avoid salt.   3. Obesity Body mass index is 38.21 kg/(m^2). Weight loss is strongly encouraged  4. Diastolic dysfunction As above,  I think that this would be better controlled in sinus rhythm. Recent fluid weight gain off diuretics, continue lasix but decrease to 40 mg  A day.  5. Recheck EKG in 7-10 days with B MET.

## 2013-11-28 ENCOUNTER — Telehealth: Payer: Self-pay | Admitting: Internal Medicine

## 2013-11-28 NOTE — Telephone Encounter (Signed)
New problem   Pt stated her weight was up 29lb and she took a metolazone and is it compatible with flecainide and how often. Pt stated she came done 8lb today after these medications.  Please advise pt

## 2013-11-29 NOTE — Telephone Encounter (Signed)
lmom for patient to return my call.  I did get a weight report from yesterday this morning and she is not far from baseline

## 2013-12-01 NOTE — Telephone Encounter (Signed)
Spoke with patient and her weight is coming back down and she is better.

## 2013-12-04 ENCOUNTER — Ambulatory Visit (INDEPENDENT_AMBULATORY_CARE_PROVIDER_SITE_OTHER): Payer: Medicare Other | Admitting: *Deleted

## 2013-12-04 VITALS — BP 142/70 | HR 51 | Resp 16 | Wt 230.0 lb

## 2013-12-04 DIAGNOSIS — I4819 Other persistent atrial fibrillation: Secondary | ICD-10-CM

## 2013-12-04 DIAGNOSIS — I4891 Unspecified atrial fibrillation: Secondary | ICD-10-CM | POA: Diagnosis not present

## 2013-12-04 LAB — BASIC METABOLIC PANEL
BUN: 21 mg/dL (ref 6–23)
CO2: 27 meq/L (ref 19–32)
Calcium: 9.2 mg/dL (ref 8.4–10.5)
Chloride: 96 mEq/L (ref 96–112)
Creatinine, Ser: 1.3 mg/dL — ABNORMAL HIGH (ref 0.4–1.2)
GFR: 44.64 mL/min — ABNORMAL LOW (ref >60.00)
GLUCOSE: 244 mg/dL — AB (ref 70–99)
POTASSIUM: 4.1 meq/L (ref 3.5–5.1)
Sodium: 136 mEq/L (ref 135–145)

## 2013-12-04 NOTE — Progress Notes (Signed)
1.) Reason for visit: EKG, s/p starting flecainide  2.) Name of MD requesting visit: Thompson Grayer, MD  3.) H&P: at last visit pt started on flecainide 100 mg BID, was instructed to decrease lasix to 40 mg daily and potassium to 10 meq daily.       She went back up to lasix 80 daily and potassium 20 meq daily due to increased weight.   4.) ROS related to problem: EKG reviewed by Dr. Rayann Heman.  Weight is 230 lb today, on last visit 11/20/13 she was 229.    5.) Assessment and plan per MD: 1.) stay on current dose of flecainide (100 mg BID)                    2.) decrease lasix back to 40 mg and potassium back to 10 meq        3.) continue daily weights, call if increase weight on lower dose of lasix        4.) keep f/u appointment with Roderic Palau, NP on 12/26/13.        5.) discuss discontinuing celexa with PCP

## 2013-12-04 NOTE — Patient Instructions (Signed)
Your physician has recommended you make the following change in your medication:  1.) decrease lasix to 40 mg once daily 2.) decrease potassium to 10 meq daily  Discuss with your PCP--coming off of Celexa.  Continue to weigh yourself daily and keep a log.  Keep your upcoming appointment on Dec 26, 2013 with Roderic Palau, NP

## 2013-12-14 ENCOUNTER — Encounter (HOSPITAL_COMMUNITY): Payer: Self-pay | Admitting: Nurse Practitioner

## 2013-12-25 DIAGNOSIS — Z23 Encounter for immunization: Secondary | ICD-10-CM | POA: Diagnosis not present

## 2013-12-26 ENCOUNTER — Ambulatory Visit (HOSPITAL_COMMUNITY): Payer: Medicare Other | Admitting: Nurse Practitioner

## 2014-01-02 ENCOUNTER — Ambulatory Visit (HOSPITAL_COMMUNITY)
Admission: RE | Admit: 2014-01-02 | Discharge: 2014-01-02 | Disposition: A | Discharge status: Home / Self Care | Payer: Medicare Other | Source: Ambulatory Visit | Attending: Nurse Practitioner | Admitting: Nurse Practitioner

## 2014-01-02 ENCOUNTER — Encounter (HOSPITAL_COMMUNITY): Payer: Self-pay | Admitting: Nurse Practitioner

## 2014-01-02 VITALS — BP 130/78 | HR 57 | Ht 69.0 in | Wt 233.2 lb

## 2014-01-02 DIAGNOSIS — I119 Hypertensive heart disease without heart failure: Secondary | ICD-10-CM | POA: Insufficient documentation

## 2014-01-02 DIAGNOSIS — E669 Obesity, unspecified: Secondary | ICD-10-CM | POA: Diagnosis not present

## 2014-01-02 DIAGNOSIS — I4891 Unspecified atrial fibrillation: Secondary | ICD-10-CM | POA: Diagnosis not present

## 2014-01-02 DIAGNOSIS — E119 Type 2 diabetes mellitus without complications: Secondary | ICD-10-CM | POA: Insufficient documentation

## 2014-01-02 LAB — BASIC METABOLIC PANEL
Anion gap: 13 (ref 5–15)
BUN: 32 mg/dL — AB (ref 6–23)
CALCIUM: 9.7 mg/dL (ref 8.4–10.5)
CO2: 30 meq/L (ref 19–32)
CREATININE: 1.55 mg/dL — AB (ref 0.50–1.10)
Chloride: 97 mEq/L (ref 96–112)
GFR calc Af Amer: 39 mL/min — ABNORMAL LOW (ref >90)
GFR calc non Af Amer: 34 mL/min — ABNORMAL LOW (ref >90)
GLUCOSE: 181 mg/dL — AB (ref 70–99)
Potassium: 3.9 mEq/L (ref 3.7–5.3)
Sodium: 140 mEq/L (ref 137–147)

## 2014-01-02 MED ORDER — FLECAINIDE ACETATE 100 MG PO TABS: 50.0000 mg | ORAL_TABLET | Freq: Two times a day (BID) | ORAL | Status: DC

## 2014-01-02 NOTE — Progress Notes (Signed)
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 She reports being in good health until 2011 when she developed pneumonia.  She says that while receiving a breathing treatment that she developed rapid irregular palpitations.  She has been in atrial fibrillation since that time.  An attempt at cardioversion was made about 3 years ago which was unsuccessful.  She has been treated with rate control since that time.   Treatment options were given to pt and she chose thospitalization for Tikosyn load, However drug had to be discontinued due to prolonged QT. She was then scheduled for a stress test to r/o CAD with thoughts of starting Flecainide, which showed low risk scan. She also had issues with fluid retention while off diuretic but is back to baseline with resumption of 80 mg lasix qd with  occasional metolazone. On last visit, flecainide 100 mg bid was started . She is now weaned off antidepressant due to known long QT.  She returns today reporting SR, good energy levels. Still has issues with fluid retention and would like to have kidney function tested today due to diuretic use.  Today, she denies symptoms of chest pain, orthopnea, PND,  Positive for chronic lower extremity edema, well managed with current diuretic, no dizziness, presyncope, syncope, or neurologic sequela. The patient is tolerating medications without difficulties and is otherwise without complaint today.   Past Medical History  Diagnosis Date  . Persistent atrial fibrillation   . Morbid obesity   . Pneumonia 2011  . Diabetes mellitus   . HTN (hypertension)   . Abnormal cardiovascular function study     MYOVIEW SHOWING ANTERIOR WALL ATTENUATION  . High cholesterol   . Scoliosis   . Spinal stenosis   . Anxiety   . Osteoarthritis   . Osteoporosis   . Mild aortic sclerosis    . Degenerative disc disease   . Vitamin D deficiency    Past Surgical History  Procedure Laterality Date  . Finger fracture surgery    . Cardiac catheterization  11/16/09    SMOOTH AND NORMAL  . Total abdominal hysterectomy    . Laparoscopic cholecystectomy    . Bladder suspension      Current Outpatient Prescriptions  Medication Sig Dispense Refill  . esomeprazole (NEXIUM) 40 MG capsule Take 40 mg by mouth daily as needed (for acid reflux).       . flecainide (TAMBOCOR) 100 MG tablet Take 0.5 tablets (50 mg total) by mouth 2 (two) times daily.  180 tablet  3  . furosemide (LASIX) 80 MG tablet Take 0.5 tablets (40 mg total) by mouth daily.      . insulin glargine (LANTUS) 100 UNIT/ML injection Inject 41-45 Units into the skin 2 (two) times daily. Takes 45 units in the morning and 41 units at night      . losartan (COZAAR) 100 MG tablet TAKE 1 TABLET BY MOUTH EVERY DAY  90 tablet  0  . metFORMIN (GLUCOPHAGE) 500 MG tablet Take 500-1,000 mg by mouth 2 (two) times daily with a meal. 1000 mg in the AM and 500mg  at night      . metoprolol succinate (TOPROL-XL) 50 MG 24 hr tablet Take 1 tablet (50 mg total) by mouth daily. Take with or immediately following a meal.  30 tablet  5  . potassium chloride (K-DUR) 10 MEQ  tablet Take 1 tablet (10 mEq total) by mouth daily.  90 tablet  3  . rivaroxaban (XARELTO) 20 MG TABS tablet Take 20 mg by mouth every morning.      . Vitamin D, Ergocalciferol, (DRISDOL) 50000 UNITS CAPS Take 50,000 Units by mouth every 7 (seven) days. Takes on Thursday      . zolpidem (AMBIEN) 10 MG tablet Take 10 mg by mouth at bedtime as needed. For sleep       No current facility-administered medications for this encounter.    Allergies  Allergen Reactions  . Albuterol     REACTION: Tachycardia- AFib  . Bee Venom Other (See Comments)    "makes me nervous"  . Epinephrine Other (See Comments)    Increases heart rate per patient.   Clementeen Hoof [[employer]] Other (See Comments)    flushing  . Penicillins     REACTION: flushing \\T \ hot    History   Social History  . Marital Status: Married    Spouse Name: N/A    Number of Children: N/A  . Years of Education: N/A   Occupational History  . Not on file.   Social History Main Topics  . Smoking status: Never Smoker   . Smokeless tobacco: Never Used  . Alcohol Use: No  . Drug Use: No  . Sexual Activity: Not on file   Other Topics Concern  . Not on file   Social History Narrative   Pt lives in [place] with spouse.   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    Family History  Problem Relation Age of Onset  . Stroke Father   . Hypertension Father   . Atrial fibrillation Father     HAD MURMUR  . Heart failure Mother   . Hypertension Brother   . Hypertension Sister   . Hypertension Son     ROS- All systems are reviewed and negative except as per the HPI above  Physical Exam: Filed Vitals:   01/02/14 1453  BP: 130/78  Pulse: 57  Height: 5\' 9"  (1.753 m)  Weight: 233 lb 4 oz (105.802 kg)  SpO2: 94%    GEN- The patient is morbidly obese appearing, alert and oriented x 3 today.   Head- normocephalic, atraumatic Eyes-  Sclera clear, conjunctiva pink Ears- hearing intact Oropharynx- clear Neck- supple,  Lungs- Clear to ausculation bilaterally, normal work of breathing Heart- irregular rate and rhythm  GI- soft, NT, ND, + BS Extremities- no clubbing, cyanosis, + trace edema MS- no significant deformity or atrophy Skin- no rash or lesion Psych- euthymic mood, full affect Neuro- strength and sensation are intact  EKG today reveals Sinus brady at  54 bpm. known long QT. 1st degree av block, right superior axis deviation, basically unchanged from previous.      Assessment and Plan:  1.  Longstanding persistent afib Maintaining SR on 100 mg of flecainide bid,  will try to reduce to 50 mg flecainide bid to see if still can maintain SR on smaller dose.   2. Hypertensive cardiovascular disease  Bp normal today. Avoid salt. Diuretics to control fluid as prescribed. Bmet today.  3. Obesity Body mass index is 34.43 kg/(m^2). Weight loss is strongly encouraged  4. Diastolic dysfunction Appears stable.  5. F/u in 3 months with Dr. Rayann Heman.

## 2014-01-02 NOTE — Patient Instructions (Signed)
Your physician recommends that you schedule a follow-up appointment in: 3 months with Dr Rayann Heman at [facility] has recommended you make the following change in your medication:  1) Decrease Flecainide to 50mg  twice daily(1/2 tablet twice daily)   Your physician recommends that you return for lab work today BMP

## 2014-01-03 NOTE — Addendum Note (Signed)
Encounter addended by: Evalee Mutton, CCT on: 01/03/2014  9:48 AM<BR>     Documentation filed: Charges VN

## 2014-01-04 ENCOUNTER — Telehealth (HOSPITAL_COMMUNITY): Payer: Self-pay | Admitting: Nurse Practitioner

## 2014-01-05 NOTE — Telephone Encounter (Signed)
Bmet reviewed. Creat/bun up from previous. Pt using 40 mg lasix with increase to 80 mg with 3 lb increase in weight. Then will take 2.5 mg metolazone if weight not down after one day. Advised that she was on dry side and to wait to increase diuretic if weight is up 5 lbs. Avoid salt. Creat cl recalculated at 59 and is still on appropriate dose of NOAC.

## 2014-01-11 DIAGNOSIS — S30860A Insect bite (nonvenomous) of lower back and pelvis, initial encounter: Secondary | ICD-10-CM | POA: Diagnosis not present

## 2014-01-11 DIAGNOSIS — W57XXXA Bitten or stung by nonvenomous insect and other nonvenomous arthropods, initial encounter: Secondary | ICD-10-CM | POA: Diagnosis not present

## 2014-01-11 DIAGNOSIS — M2550 Pain in unspecified joint: Secondary | ICD-10-CM | POA: Diagnosis not present

## 2014-01-15 ENCOUNTER — Ambulatory Visit
Admission: RE | Admit: 2014-01-15 | Discharge: 2014-01-15 | Disposition: A | Discharge status: Home / Self Care | Payer: Medicare Other | Source: Ambulatory Visit | Attending: Internal Medicine | Admitting: Internal Medicine

## 2014-01-15 ENCOUNTER — Other Ambulatory Visit: Payer: Self-pay | Admitting: Internal Medicine

## 2014-01-15 DIAGNOSIS — Z1231 Encounter for screening mammogram for malignant neoplasm of breast: Secondary | ICD-10-CM

## 2014-01-23 ENCOUNTER — Other Ambulatory Visit: Payer: Self-pay | Admitting: Cardiovascular Disease

## 2014-02-06 ENCOUNTER — Encounter: Payer: Medicare Other | Attending: Internal Medicine | Admitting: Dietician

## 2014-02-06 DIAGNOSIS — Z794 Long term (current) use of insulin: Secondary | ICD-10-CM | POA: Insufficient documentation

## 2014-02-06 DIAGNOSIS — Z713 Dietary counseling and surveillance: Secondary | ICD-10-CM | POA: Diagnosis not present

## 2014-02-06 DIAGNOSIS — E119 Type 2 diabetes mellitus without complications: Secondary | ICD-10-CM | POA: Insufficient documentation

## 2014-02-06 NOTE — Progress Notes (Signed)
  Medical Nutrition Therapy:  Appt start time: 1010 end time:  [id number].  Assessment:  Primary concerns today: Kristine [name] retuns today for a follow up for CHF and diabetes. Weight today is up about 16 lbs since July. Weighs every day.  Has been ordering food to pick up on [employer] food that is low in salt. Has been eliminating salt from diet as much as possible.   Kristine [name] decided that she would like to work on limiting her sugar intake. Hgb A1c is about 7.2% now and has another one scheduled at the end of the month. Down from 8.2%.    Preferred Learning Style:   No preference indicated   Learning Readiness:   Ready  MEDICATIONS: Lasix, Xarelto   DIETARY INTAKE:  24-hr recall:  B ( AM): eggs with cooking spray and butter and 2 pieces of toast with butter with milk or coffee or creamed rice with toast Snk ( AM): hot chocolate or apple  L ( PM): green beans, sweet potatoes, baked potato, ham and lettuce sandwich Snk ( PM): apple, banana, or grapes D ( PM): banana sandwich with green beans and sweet potatoes  Snk ( PM): peanut butter and crackers, cereal, 1/2 sandwich, snack bars, or hot chocolate Beverages: tea with sugar or splenda, skim-2% milk, black coffee  Usual physical activity: walks or plays ball with grandchildren for 10 minutes 3 x day   Estimated energy needs: 1600 calories 180 g carbohydrates 120 g protein 44 g fat  Progress Towards Goal(s):  In progress.   Nutritional Diagnosis:  NI-5.10.2 Excessive mineral intake (specify): sodium As related to related fluid retention from congestive heart failure.  As evidenced by diet recall of frequent high sodium meals.  Nutritional Diagnosis:  [name]-2.1 Inpaired nutrition utilization As related to glucose metabolism.  As evidenced by Hgb A1c of 8.2%.    Intervention:  Nutrition counseling provided. Will be following up in 4 weeks to spend more time on strategies to manage diabetes with diet.   Plan: Try to do a 1/2 banana sandwich.  On the side add cottage cheese with a small amount of fruit and vegetables. Aim to fill half of your plate with vegetables (fresh or frozen). Have protein with each meal and snack. (boiled eggs, string cheese, unsalted, unsalted peanut butter). Try to add garlic to broccoli.  Try [facility] [employer] or Market researcher Method Utilized:  International aid/development worker on  Handouts given during visit include:  Living Well With Diabetes  Yellow cards  MyPlate Handout  15 g CHO Snacks  Barriers to learning/adherence to lifestyle change: none  Demonstrated degree of understanding via:  Teach Back   Monitoring/Evaluation:  Dietary intake, exercise, and body weight in 4 week(s).

## 2014-02-06 NOTE — Patient Instructions (Addendum)
Try to do a 1/2 banana sandwich. On the side add cottage cheese with a small amount of fruit and vegetables. Aim to fill half of your plate with vegetables (fresh or frozen). Have protein with each meal and snack. (boiled eggs, string cheese, unsalted, unsalted peanut butter). Try to add garlic to broccoli.  [place] or [employer]

## 2014-02-13 DIAGNOSIS — I4891 Unspecified atrial fibrillation: Secondary | ICD-10-CM | POA: Diagnosis not present

## 2014-02-13 DIAGNOSIS — E785 Hyperlipidemia, unspecified: Secondary | ICD-10-CM | POA: Diagnosis not present

## 2014-02-13 DIAGNOSIS — E1165 Type 2 diabetes mellitus with hyperglycemia: Secondary | ICD-10-CM | POA: Diagnosis not present

## 2014-02-13 DIAGNOSIS — E559 Vitamin D deficiency, unspecified: Secondary | ICD-10-CM | POA: Diagnosis not present

## 2014-02-13 DIAGNOSIS — R8299 Other abnormal findings in urine: Secondary | ICD-10-CM | POA: Diagnosis not present

## 2014-02-21 DIAGNOSIS — M609 Myositis, unspecified: Secondary | ICD-10-CM | POA: Diagnosis not present

## 2014-02-21 DIAGNOSIS — R601 Generalized edema: Secondary | ICD-10-CM | POA: Diagnosis not present

## 2014-02-21 DIAGNOSIS — M5380 Other specified dorsopathies, site unspecified: Secondary | ICD-10-CM | POA: Diagnosis not present

## 2014-02-21 DIAGNOSIS — I10 Essential (primary) hypertension: Secondary | ICD-10-CM | POA: Diagnosis not present

## 2014-02-21 DIAGNOSIS — Z008 Encounter for other general examination: Secondary | ICD-10-CM | POA: Diagnosis not present

## 2014-02-21 DIAGNOSIS — K219 Gastro-esophageal reflux disease without esophagitis: Secondary | ICD-10-CM | POA: Diagnosis not present

## 2014-02-21 DIAGNOSIS — Z1389 Encounter for screening for other disorder: Secondary | ICD-10-CM | POA: Diagnosis not present

## 2014-02-21 DIAGNOSIS — E1165 Type 2 diabetes mellitus with hyperglycemia: Secondary | ICD-10-CM | POA: Diagnosis not present

## 2014-02-21 DIAGNOSIS — E785 Hyperlipidemia, unspecified: Secondary | ICD-10-CM | POA: Diagnosis not present

## 2014-03-22 ENCOUNTER — Other Ambulatory Visit: Payer: Self-pay | Admitting: Cardiovascular Disease

## 2014-03-26 DIAGNOSIS — I10 Essential (primary) hypertension: Secondary | ICD-10-CM | POA: Diagnosis not present

## 2014-03-26 DIAGNOSIS — R0683 Snoring: Secondary | ICD-10-CM | POA: Diagnosis not present

## 2014-03-26 DIAGNOSIS — F419 Anxiety disorder, unspecified: Secondary | ICD-10-CM | POA: Diagnosis not present

## 2014-03-26 DIAGNOSIS — K5900 Constipation, unspecified: Secondary | ICD-10-CM | POA: Diagnosis not present

## 2014-03-26 DIAGNOSIS — E1165 Type 2 diabetes mellitus with hyperglycemia: Secondary | ICD-10-CM | POA: Diagnosis not present

## 2014-03-26 DIAGNOSIS — I480 Paroxysmal atrial fibrillation: Secondary | ICD-10-CM | POA: Diagnosis not present

## 2014-03-26 DIAGNOSIS — Z6836 Body mass index (BMI) 36.0-36.9, adult: Secondary | ICD-10-CM | POA: Diagnosis not present

## 2014-03-26 DIAGNOSIS — L439 Lichen planus, unspecified: Secondary | ICD-10-CM | POA: Diagnosis not present

## 2014-03-27 DIAGNOSIS — I10 Essential (primary) hypertension: Secondary | ICD-10-CM | POA: Diagnosis not present

## 2014-03-27 DIAGNOSIS — E1165 Type 2 diabetes mellitus with hyperglycemia: Secondary | ICD-10-CM | POA: Diagnosis not present

## 2014-03-27 DIAGNOSIS — I509 Heart failure, unspecified: Secondary | ICD-10-CM | POA: Diagnosis not present

## 2014-03-27 DIAGNOSIS — Z6836 Body mass index (BMI) 36.0-36.9, adult: Secondary | ICD-10-CM | POA: Diagnosis not present

## 2014-03-27 DIAGNOSIS — R0600 Dyspnea, unspecified: Secondary | ICD-10-CM | POA: Diagnosis not present

## 2014-03-29 ENCOUNTER — Encounter: Payer: Medicare Other | Attending: Internal Medicine | Admitting: Dietician

## 2014-03-29 ENCOUNTER — Ambulatory Visit (INDEPENDENT_AMBULATORY_CARE_PROVIDER_SITE_OTHER): Payer: Medicare Other | Admitting: Cardiovascular Disease

## 2014-03-29 ENCOUNTER — Encounter: Payer: Self-pay | Admitting: Internal Medicine

## 2014-03-29 ENCOUNTER — Encounter: Payer: Self-pay | Admitting: Cardiovascular Disease

## 2014-03-29 VITALS — BP 138/84 | HR 65 | Ht 69.0 in | Wt 248.2 lb

## 2014-03-29 VITALS — Ht 69.0 in | Wt 247.1 lb

## 2014-03-29 DIAGNOSIS — Z794 Long term (current) use of insulin: Secondary | ICD-10-CM | POA: Diagnosis not present

## 2014-03-29 DIAGNOSIS — I119 Hypertensive heart disease without heart failure: Secondary | ICD-10-CM

## 2014-03-29 DIAGNOSIS — E119 Type 2 diabetes mellitus without complications: Secondary | ICD-10-CM | POA: Insufficient documentation

## 2014-03-29 DIAGNOSIS — I481 Persistent atrial fibrillation: Secondary | ICD-10-CM | POA: Diagnosis not present

## 2014-03-29 DIAGNOSIS — I4891 Unspecified atrial fibrillation: Secondary | ICD-10-CM | POA: Diagnosis not present

## 2014-03-29 DIAGNOSIS — I4819 Other persistent atrial fibrillation: Secondary | ICD-10-CM

## 2014-03-29 DIAGNOSIS — Z713 Dietary counseling and surveillance: Secondary | ICD-10-CM | POA: Diagnosis not present

## 2014-03-29 DIAGNOSIS — E669 Obesity, unspecified: Secondary | ICD-10-CM

## 2014-03-29 MED ORDER — POTASSIUM CHLORIDE ER 10 MEQ PO TBCR: 10.0000 meq | EXTENDED_RELEASE_TABLET | Freq: Two times a day (BID) | ORAL | Status: DC

## 2014-03-29 MED ORDER — FUROSEMIDE 80 MG PO TABS: 80.0000 mg | ORAL_TABLET | Freq: Two times a day (BID) | ORAL | Status: DC

## 2014-03-29 NOTE — Patient Instructions (Signed)
Your physician has recommended you make the following change in your medication: 1) INCREASE Lasix to 80mg  twice daily 2) INCREASE KDUR to 10 mEq twice daily  Your physician recommends that you return for lab work in: 3 weeks (BMET)  Your physician has requested that you have an exercise tolerance test. For further information please visit [email or web address]. Please also follow instruction sheet, as given.  Your physician recommends that you schedule a follow-up appointment in: 3 months with Dr. Acie Fredrickson.

## 2014-03-29 NOTE — Patient Instructions (Addendum)
Have protein with each meal and snack. (low salt cottage cheese, boiled eggs, string cheese, unsalted nuts, protein shake  unsalted peanut butter). Try to add garlic to broccoli.  Try [facility] [employer] (low in carbs and high in protein). Try chocolate protein Premier shake instead of hot chocolate (have with a carbohydrate).  If you are hungry, you can have as many non starchy vegetables as you want. Have dressing on the side.  Drink mostly water other sugar free drinks such as coffee or tea with Sweet N Low. Plan to walk for 30 minutes every day.

## 2014-03-29 NOTE — Assessment & Plan Note (Signed)
She seems to be doing well.  Maintaining NSR. QTc is at the upper limit of normal. Seems to be stable  She needs to return for a treadmill to check for arrhymias.

## 2014-03-29 NOTE — Progress Notes (Signed)
Kristine [name] Date of Birth  [birth year] [facility] [address]    [place], [place]  [address] [phone]  Fax  [phone]   Problem list: 1. Atrial fibrillation 2. Diabetes mellitus  History of Present Illness:  Kristine [name] is a 69 year old female with a history of atrial fibrillation. She has done well since I last saw her.  She's not had any episodes of chest pain or shortness breath. Her blood pressure readings have been normal.  She is tolerating the Xarelto fairly well.  She has some occasional bleeding from the gums.  She enjoys not having to check her INR levels.     She has occasional palpitations and heart racing.  This typically can rest and these will resolve.  She is able to wak about 10 minutes at a time.   Oct. 24, 2014:  June 23, 2013:   Kristine [name]'s BP is elevated.  She had a recent echocardiogram that revealed a left ventricle systolic function that was at the lower limits of normal-50%. Study Conclusions  - Left ventricle: The cavity size was normal. Wall thickness was increased in a pattern of mild LVH. The estimated ejection fraction was 50%. Diffuse hypokinesis. Features are consistent with a pseudonormal left ventricular filling pattern, with concomitant abnormal relaxation and increased filling pressure (grade 2 diastolic dysfunction). - Aortic valve: There was no stenosis. - Mitral valve: Mildly calcified annulus. Mildly calcified leaflets . Trivial regurgitation. - Left atrium: The atrium was moderately dilated. - Right ventricle: The cavity size was normal. Systolic function was mildly reduced. - Right atrium: The atrium was moderately dilated. - Systemic veins: IVC measured 1.7 cm with < 50% respirophasic variation, suggesting RA pressure 8 mmHg. - Pericardium, extracardiac: A trivial pericardial effusion was identified posterior to the heart  She was having some abdominal pain and   September 29, 2013:  Kristine [name] is doing  well.  Stressed about coming here.   Jan. 7, 2015:  Kristine [name] is a 69 yo with hx of Afib and diastolic dysfunction She converted to NSR in the hospital with flecainide. She was tried on [place] but her QT interval prolonged.  Was changed to Flecainide and has maintained NSR.  She is exercising regularly - 30 minutes a day. Is on Lasix 80 mg a day and mtalazone 2. 5 once a week.  Is still retaining fluid. Trying to avoid salt in food.    Current Outpatient Prescriptions on File Prior to Visit  Medication Sig Dispense Refill  . esomeprazole (NEXIUM) 40 MG capsule Take 40 mg by mouth daily as needed (for acid reflux).     . flecainide (TAMBOCOR) 100 MG tablet Take 0.5 tablets (50 mg total) by mouth 2 (two) times daily. 180 tablet 3  . furosemide (LASIX) 80 MG tablet Take 0.5 tablets (40 mg total) by mouth daily.    . insulin glargine (LANTUS) 100 UNIT/ML injection Inject 41-45 Units into the skin 2 (two) times daily. Takes 45 units in the morning and 41 units at night    . losartan (COZAAR) 100 MG tablet TAKE 1 TABLET BY MOUTH EVERY DAY 90 tablet 0  . metFORMIN (GLUCOPHAGE) 500 MG tablet Take 500-1,000 mg by mouth 2 (two) times daily with a meal. 1000 mg in the AM and 500mg  at night    . metoprolol succinate (TOPROL-XL) 50 MG 24 hr tablet Take 1 tablet (50 mg total) by mouth daily. Take with or immediately following a meal. 30 tablet 5  . potassium  chloride (K-DUR) 10 MEQ tablet Take 1 tablet (10 mEq total) by mouth daily. 90 tablet 3  . rivaroxaban (XARELTO) 20 MG TABS tablet Take 20 mg by mouth every morning.    . Vitamin D, Ergocalciferol, (DRISDOL) 50000 UNITS CAPS Take 50,000 Units by mouth every 7 (seven) days. Takes on Thursday     No current facility-administered medications on file prior to visit.    Allergies  Allergen Reactions  . Albuterol     REACTION: Tachycardia- AFib  . Bee Venom Other (See Comments)    "makes me nervous"  . Epinephrine Other (See Comments)    Increases  heart rate per patient.   Clementeen Hoof [Iodinated Diagnostic Agents] Other (See Comments)    flushing  . Penicillins     REACTION: flushing \\T \ hot    Past Medical History  Diagnosis Date  . Persistent atrial fibrillation   . Morbid obesity   . Pneumonia 2011  . Diabetes mellitus   . HTN (hypertension)   . Abnormal cardiovascular function study     MYOVIEW SHOWING ANTERIOR WALL ATTENUATION  . High cholesterol   . Scoliosis   . Spinal stenosis   . Anxiety   . Osteoarthritis   . Osteoporosis   . Mild aortic sclerosis   . Degenerative disc disease   . Vitamin D deficiency     Past Surgical History  Procedure Laterality Date  . Finger fracture surgery    . Cardiac catheterization  11/16/09    SMOOTH AND NORMAL  . Total abdominal hysterectomy    . Laparoscopic cholecystectomy    . Bladder suspension      History  Smoking status  . Never Smoker   Smokeless tobacco  . Never Used    History  Alcohol Use No    Family History  Problem Relation Age of Onset  . Stroke Father   . Hypertension Father   . Atrial fibrillation Father     HAD MURMUR  . Heart failure Mother   . Hypertension Brother   . Hypertension Sister   . Hypertension Son     Reviw of Systems:  Reviewed in the HPI.  All other systems are negative.  Physical Exam: BP 138/84 mmHg  Pulse 65  Ht 5\' 9"  (1.753 m)  Wt 248 lb 3.2 oz (112.583 kg)  BMI 36.64 kg/m2 The patient is alert and oriented x 3.  The mood and affect are normal.   Skin: warm and dry.  Color is normal.    HEENT:   Pupils carotids. She has no bruits. She is no JVD  Lungs: Her lungs are clear.   Heart: Irregularly irregular. Her heart rate is fairly slow.    Abdomen: The abdomen is benign. She is mildly obese.  Extremities:  Shows no clubbing cyanosis or edema.  Neuro:  In the exam is nonfocal.    ECG: Jan. 7, 2016:  NSR at 65.  RAD, prolonged QT. Marland Kitchen499 sec.    Assessment / Plan:

## 2014-03-29 NOTE — Progress Notes (Signed)
  Medical Nutrition Therapy:  Appt start time: [id number] end time:  [id number].  Assessment:  Primary concerns today: Kristine [name] today for a follow up for CHF and diabetes. Weight today is up about 12 lbs since November.   Most recent Hgb A1c was 9.2% 8 weeks ago. Has been eating less canned foods and using the crock pot more than before. Has been eliminating salt from diet as much as possible. Has been reading labels to help limit sodium and carbohydrates.   Has been eating a lot of vegetables. Having celery with peanut butter or pimento cheese.   Preferred Learning Style:   No preference indicated   Learning Readiness:   Ready  MEDICATIONS: Lasix, Xarelto   DIETARY INTAKE:  24-hr recall:  B ( AM): eggs with cooking spray and butter and 2 pieces of toast with butter with milk or coffee or creamed rice with toast or oatmeal Snk ( AM): hot chocolate or apple  L ( PM): green beans, sweet potatoes, baked potato, ham and lettuce sandwich Snk ( PM): apple, banana, or grapes D ( PM): banana sandwich with peanut butter with green beans and sweet potatoes  Snk ( PM): peanut butter and crackers, cereal, 1/2 sandwich, snack bars, or hot chocolate Beverages: tea with sugar or splenda, skim-2% milk, black coffee, 4 oz juice, apple cider  Usual physical activity: walks or plays ball with grandchildren for 10 minutes 3 x day   Estimated energy needs: 1600 calories 180 g carbohydrates 120 g protein 44 g fat  Progress Towards Goal(s):  In progress.   Nutritional Diagnosis:  NI-5.10.2 Excessive mineral intake (specify): sodium As related to related fluid retention from congestive heart failure.  As evidenced by diet recall of frequent high sodium meals.  Nutritional Diagnosis:  [place]-2.1 Inpaired nutrition utilization As related to glucose metabolism.  As evidenced by Hgb A1c of 8.2%.    Intervention:  Nutrition counseling provided.   Plan: Have protein with each meal and snack. (low salt cottage  cheese, boiled eggs, string cheese, unsalted nuts, protein shake  unsalted peanut butter). Try to add garlic to broccoli.  Try [facility] [employer] (low in carbs and high in protein). Try chocolate protein Premier shake instead of hot chocolate (have with a carbohydrate).  If you are hungry, you can have as many non starchy vegetables as you want. Have dressing on the side.  Drink mostly water other sugar free drinks such as coffee or tea with Sweet N Low. Plan to walk for 30 minutes every day.   Teaching Method Utilized:  Visual Auditory Hands on  Handouts given during visit include:  Low Sodium MNT  1500 calorie Meal Plan  Low sodium flavoring tips  Barriers to learning/adherence to lifestyle change: none  Demonstrated degree of understanding via:  Teach Back   Monitoring/Evaluation:  Dietary intake, exercise, and body weight in 2 month(s).

## 2014-03-29 NOTE — Assessment & Plan Note (Signed)
She continues shortness breath especially with exertion. We will increase her Lasix to 80 mg twice a day and increase her potassium chloride to 10 mg twice a day. 2 continue to use the metolazone only as needed. We'll check a basic metabolic profile in 3 weeks. I'll see her again in 3 months for followup visit.

## 2014-04-01 ENCOUNTER — Other Ambulatory Visit: Payer: Self-pay | Admitting: Internal Medicine

## 2014-04-16 ENCOUNTER — Ambulatory Visit (INDEPENDENT_AMBULATORY_CARE_PROVIDER_SITE_OTHER): Payer: Medicare Other | Admitting: Internal Medicine

## 2014-04-16 DIAGNOSIS — I4891 Unspecified atrial fibrillation: Secondary | ICD-10-CM

## 2014-04-16 NOTE — Progress Notes (Signed)
Pt did not show for appointment. 

## 2014-04-17 ENCOUNTER — Encounter: Payer: Self-pay | Admitting: Internal Medicine

## 2014-04-19 ENCOUNTER — Other Ambulatory Visit (INDEPENDENT_AMBULATORY_CARE_PROVIDER_SITE_OTHER): Payer: Medicare Other | Admitting: *Deleted

## 2014-04-19 DIAGNOSIS — I119 Hypertensive heart disease without heart failure: Secondary | ICD-10-CM

## 2014-04-19 LAB — BASIC METABOLIC PANEL
BUN: 58 mg/dL — ABNORMAL HIGH (ref 6–23)
CHLORIDE: 89 meq/L — AB (ref 96–112)
CO2: 31 meq/L (ref 19–32)
Calcium: 9.7 mg/dL (ref 8.4–10.5)
Creatinine, Ser: 1.7 mg/dL — ABNORMAL HIGH (ref 0.40–1.20)
GFR: 31.84 mL/min — ABNORMAL LOW (ref >60.00)
GLUCOSE: 278 mg/dL — AB (ref 70–99)
Potassium: 3.1 mEq/L — ABNORMAL LOW (ref 3.5–5.1)
SODIUM: 135 meq/L (ref 135–145)

## 2014-04-19 NOTE — Addendum Note (Signed)
Addended by: Eulis Foster on: 04/19/2014 10:06 AM   Modules accepted: Orders

## 2014-04-23 ENCOUNTER — Telehealth: Payer: Self-pay | Admitting: Nurse Practitioner

## 2014-04-23 DIAGNOSIS — I119 Hypertensive heart disease without heart failure: Secondary | ICD-10-CM

## 2014-04-23 NOTE — Telephone Encounter (Signed)
Reviewed lab results and plan of care with patient who takes Furosemide 80 mg BID and takes Zaroxolyn approximately every 3 days.  Patient does not take Torsemide.  I advised patient to stop Zaroxolyn and continue Furosemide.  Patient scheduled for repeat bmet on 3/9 and advised to f/u on elevated glucose with PCP; also advised to increase intake of dietary potassium.  Patient verbalized understanding and agreement. Order for repeat bmet in epic and Zaroxolyn removed from patient's medication list Copy of lab results mailed to patient per request

## 2014-04-23 NOTE — Telephone Encounter (Signed)
-----   Message from Thayer Headings, MD sent at 04/19/2014  5:25 PM EST ----- Her labs suggest that she is on depleted. I suspect that she is taking metolazone to often . Please have her stop metolazone and continue the torsemide. Recheck labs in one month.  Her glucose level are also very elevated. This information is to be 40 to her medical doctor.

## 2014-04-25 DIAGNOSIS — L821 Other seborrheic keratosis: Secondary | ICD-10-CM | POA: Diagnosis not present

## 2014-04-25 DIAGNOSIS — L439 Lichen planus, unspecified: Secondary | ICD-10-CM | POA: Diagnosis not present

## 2014-04-25 DIAGNOSIS — L570 Actinic keratosis: Secondary | ICD-10-CM | POA: Diagnosis not present

## 2014-05-04 ENCOUNTER — Encounter: Payer: Medicare Other | Admitting: Physician Assistant

## 2014-05-04 ENCOUNTER — Ambulatory Visit: Payer: Medicare Other | Admitting: Physician Assistant

## 2014-05-15 ENCOUNTER — Other Ambulatory Visit: Payer: Self-pay | Admitting: Internal Medicine

## 2014-05-29 ENCOUNTER — Other Ambulatory Visit: Payer: Self-pay | Admitting: Cardiovascular Disease

## 2014-05-30 ENCOUNTER — Ambulatory Visit: Payer: Medicare Other | Admitting: Dietician

## 2014-05-30 ENCOUNTER — Ambulatory Visit (INDEPENDENT_AMBULATORY_CARE_PROVIDER_SITE_OTHER): Payer: Medicare Other | Admitting: Internal Medicine

## 2014-05-30 ENCOUNTER — Other Ambulatory Visit (INDEPENDENT_AMBULATORY_CARE_PROVIDER_SITE_OTHER): Payer: Medicare Other | Admitting: *Deleted

## 2014-05-30 VITALS — BP 140/80 | HR 69 | Ht 69.0 in | Wt 246.8 lb

## 2014-05-30 DIAGNOSIS — I5032 Chronic diastolic (congestive) heart failure: Secondary | ICD-10-CM

## 2014-05-30 DIAGNOSIS — I4891 Unspecified atrial fibrillation: Secondary | ICD-10-CM

## 2014-05-30 DIAGNOSIS — I119 Hypertensive heart disease without heart failure: Secondary | ICD-10-CM

## 2014-05-30 NOTE — Progress Notes (Signed)
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 female with a h/o obesity, DM, HTN, and persistent afib who presents for EP consultation. She reports being in good health until 2011 when she developed pneumonia.  She says that while receiving a breathing treatment that she developed rapid irregular palpitations.  She has been in atrial fibrillation since that time.  An attempt at cardioversion was made about 3 years ago which was unsuccessful.  She has been treated with rate control since that time.   Treatment options were given to pt and she chose thospitalization for Tikosyn load, However drug had to be discontinued due to prolonged QT. She was then scheduled for a stress test to r/o CAD with thoughts of starting Flecainide, which showed low risk scan. She also had issues with fluid retention while off diuretic but is back to baseline with resumption of 80 mg lasix qd with  occasional metolazone. On last visit, flecainide 100 mg bid was started . She is now weaned off antidepressant due to known long QT.  She returns today with no issue with afib/irregular heart beat. Continues on flecainide. Continues with tendency for long QT.   Issues with fluid retention with tendency to overuse diuretics and last labs one month ago revealed dehydration. She is pending repeat labs today. With last renal function of 1.7,crcl calculated 57.3, still appropriately anticoagulated with xarelto 20 mg a day.No bleeding issues.Sleeps poorly and sounds like she may have sleep apnea, but pt declines sleep study.  Today, she denies symptoms of chest pain, orthopnea, PND,  Positive for chronic lower extremity edema, well managed with current diuretic, no dizziness, presyncope, syncope, or neurologic sequela. The patient is tolerating medications without difficulties and is otherwise without complaint today.   Past Medical History  Diagnosis Date  .  Persistent atrial fibrillation   . Morbid obesity   . Pneumonia 2011  . Diabetes mellitus   . HTN (hypertension)   . Abnormal cardiovascular function study     MYOVIEW SHOWING ANTERIOR WALL ATTENUATION  . High cholesterol   . Scoliosis   . Spinal stenosis   . Anxiety   . Osteoarthritis   . Osteoporosis   . Mild aortic sclerosis   . Degenerative disc disease   . Vitamin D deficiency   . CHF (congestive heart failure)   . Pneumonia   . GERD (gastroesophageal reflux disease)   . Lichen planus   . Anasarca    Past Surgical History  Procedure Laterality Date  . Finger fracture surgery    . Cardiac catheterization  11/16/09    SMOOTH AND NORMAL  . Total abdominal hysterectomy    . Laparoscopic cholecystectomy    . Bladder suspension    . Colonoscopy  08/14/08    Current Outpatient Prescriptions  Medication Sig Dispense Refill  . ALPRAZolam (XANAX) 0.25 MG tablet Take 1/2- 1 tablet by mouth twice a a day as needed for anxiety  0  . esomeprazole (NEXIUM) 40 MG capsule Take 40 mg by mouth daily as needed (for acid reflux).     . flecainide (TAMBOCOR) 100 MG tablet Take 100 mg by mouth 2 (two) times daily.    . furosemide (LASIX) 80 MG tablet Take 80 mg by mouth 2 (two) times daily.     . insulin glargine (LANTUS) 100 UNIT/ML injection Inject 45-48 Units into the skin 2 (two) times daily. Takes 48 units in the  morning and 45 units at night    . KLOR-CON 10 10 MEQ tablet Take 20 mEq by mouth daily.  11  . losartan (COZAAR) 100 MG tablet TAKE 1 TABLET BY MOUTH EVERY DAY 90 tablet 0  . metFORMIN (GLUCOPHAGE) 500 MG tablet Take 500-1,000 mg by mouth 2 (two) times daily with a meal. 1000 mg in the AM and 500mg  at night    . metolazone (ZAROXOLYN) 2.5 MG tablet Take 2.5 mg by mouth daily. Take 30 minutes prior to morning dose of Furosmide    . metoprolol succinate (TOPROL-XL) 50 MG 24 hr tablet Take 1 tablet by mouth daily. Take with or immediately following a meal  5  . rivaroxaban  (XARELTO) 20 MG TABS tablet Take 20 mg by mouth every morning.    . Vitamin D, Ergocalciferol, (DRISDOL) 50000 UNITS CAPS Take 50,000 Units by mouth every 7 (seven) days. Takes on Thursday     No current facility-administered medications for this visit.    Allergies  Allergen Reactions  . Albuterol     REACTION: Tachycardia- AFib  . Bee Venom Other (See Comments)    "makes me nervous"  . Epinephrine Other (See Comments)    Increases heart rate per patient.   Clementeen Hoof [Iodinated Diagnostic Agents] Other (See Comments)    flushing  . Penicillins     REACTION: flushing \\T \ hot    History   Social History  . Marital Status: Married    Spouse Name: N/A  . Number of Children: N/A  . Years of Education: N/A   Occupational History  . Not on file.   Social History Main Topics  . Smoking status: Never Smoker   . Smokeless tobacco: Never Used  . Alcohol Use: No  . Drug Use: No  . Sexual Activity: Not on file   Other Topics Concern  . Not on file   Social History Narrative   Pt lives in [place] with spouse.   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    Family History  Problem Relation Age of Onset  . Stroke Father   . Hypertension Father   . Atrial fibrillation Father     HAD MURMUR  . Heart failure Mother   . Hypertension Brother   . Hypertension Sister   . Hypertension Son   . Congestive Heart Failure Mother   . Hypertension Mother   . CAD Sister     EARLY  . CAD Brother     EARLY    ROS- All systems are reviewed and negative except as per the HPI above  Physical Exam: Filed Vitals:   05/30/14 0931  BP: 140/80  Pulse: 69  Height: 5\' 9"  (1.753 m)  Weight: 246 lb 12.8 oz (111.948 kg)    GEN- The patient is morbidly obese appearing, alert and oriented x 3 today.   Head- normocephalic, atraumatic Eyes-  Sclera clear, conjunctiva pink Ears- hearing  intact Oropharynx- clear Neck- supple,  Lungs- Clear to ausculation bilaterally, normal work of breathing Heart- irregular rate and rhythm  GI- soft, NT, ND, + BS Extremities- no clubbing, cyanosis, + trace edema MS- no significant deformity or atrophy Skin- no rash or lesion Psych- euthymic mood, full affect Neuro- strength and sensation are intact  EKG today reveals Sinus brady at  54 bpm. known long QT, QTc 507 ms. Pr 162 ms, QRS  64ms bmet pending    Assessment and Plan:  1.  Longstanding persistent afib Maintaining SR on 50 mg bid Flecainide Long QT- avoid QT prolonging drugs  2. Hypertensive cardiovascular disease  Bp normal today. Avoid salt. Diuretics to control fluid as prescribed, but cautioned not to overuse, especially metolazone. Bmet pending  3. Obesity Body mass index is 36.43 kg/(m^2). Weight loss is strongly encouraged  4. Diastolic dysfunction Appears stable.  5. Snoring history Declines sleep study  6. F/u in 3 months with afib clinic  Thompson Grayer MD

## 2014-05-30 NOTE — Patient Instructions (Signed)
Your physician recommends that you schedule a follow-up appointment in: 3 months with Roderic Palau, NP

## 2014-06-01 ENCOUNTER — Other Ambulatory Visit (INDEPENDENT_AMBULATORY_CARE_PROVIDER_SITE_OTHER): Payer: Medicare Other | Admitting: *Deleted

## 2014-06-01 ENCOUNTER — Other Ambulatory Visit: Payer: Medicare Other

## 2014-06-01 DIAGNOSIS — I10 Essential (primary) hypertension: Secondary | ICD-10-CM

## 2014-06-01 DIAGNOSIS — E119 Type 2 diabetes mellitus without complications: Secondary | ICD-10-CM

## 2014-06-01 DIAGNOSIS — Z79899 Other long term (current) drug therapy: Secondary | ICD-10-CM

## 2014-06-01 DIAGNOSIS — I5042 Chronic combined systolic (congestive) and diastolic (congestive) heart failure: Secondary | ICD-10-CM

## 2014-06-01 DIAGNOSIS — I4891 Unspecified atrial fibrillation: Secondary | ICD-10-CM

## 2014-06-01 LAB — BASIC METABOLIC PANEL
BUN: 29 mg/dL — ABNORMAL HIGH (ref 6–23)
CO2: 32 mEq/L (ref 19–32)
Calcium: 9.5 mg/dL (ref 8.4–10.5)
Chloride: 96 mEq/L (ref 96–112)
Creatinine, Ser: 1.4 mg/dL — ABNORMAL HIGH (ref 0.40–1.20)
GFR: 39.83 mL/min — ABNORMAL LOW (ref >60.00)
Glucose, Bld: 290 mg/dL — ABNORMAL HIGH (ref 70–99)
POTASSIUM: 3.7 meq/L (ref 3.5–5.1)
SODIUM: 134 meq/L — AB (ref 135–145)

## 2014-06-05 ENCOUNTER — Telehealth: Payer: Self-pay | Admitting: Cardiovascular Disease

## 2014-06-05 NOTE — Telephone Encounter (Signed)
New message ° ° ° ° °Want lab results °

## 2014-06-05 NOTE — Telephone Encounter (Signed)
Spoke with pt and reviewed BMP results with her.  Copy mailed to her at her request.

## 2014-06-11 ENCOUNTER — Ambulatory Visit (INDEPENDENT_AMBULATORY_CARE_PROVIDER_SITE_OTHER): Payer: Medicare Other | Admitting: Cardiovascular Disease

## 2014-06-11 ENCOUNTER — Encounter: Payer: Self-pay | Admitting: Cardiovascular Disease

## 2014-06-11 VITALS — BP 142/90 | HR 73 | Ht 69.0 in | Wt 252.2 lb

## 2014-06-11 DIAGNOSIS — I5032 Chronic diastolic (congestive) heart failure: Secondary | ICD-10-CM

## 2014-06-11 LAB — BASIC METABOLIC PANEL
BUN: 17 mg/dL (ref 6–23)
CALCIUM: 9.3 mg/dL (ref 8.4–10.5)
CO2: 27 mEq/L (ref 19–32)
Chloride: 102 mEq/L (ref 96–112)
Creatinine, Ser: 1.18 mg/dL (ref 0.40–1.20)
GFR: 48.51 mL/min — AB (ref >60.00)
Glucose, Bld: 137 mg/dL — ABNORMAL HIGH (ref 70–99)
Potassium: 3.9 mEq/L (ref 3.5–5.1)
Sodium: 140 mEq/L (ref 135–145)

## 2014-06-11 NOTE — Patient Instructions (Signed)
Your physician has recommended you make the following change in your medication:  Do not take anymore Metolazone  Your physician recommends that you have lab work: [place] metabolic panel  Your physician wants you to follow-up in: 6 months with Dr. Acie Fredrickson.  You will receive a reminder letter in the mail two months in advance. If you don't receive a letter, please call our office to schedule the follow-up appointment.

## 2014-06-11 NOTE — Progress Notes (Signed)
Cardiology Office Note   Date:  06/11/2014   ID:  Kristine [name], DOB December 25, 1945, MRN [id number]  PCP:  Tivis Ringer, MD  Cardiologist:   Thayer Headings, MD   No chief complaint on file.  1. Atrial fibrillation 2. Diabetes mellitus 3. Chronic diastolic CHF 4. Obesity.   History of Present Illness:  Kristine [name] is a 69 year old female with a history of atrial fibrillation. She has done well since I last saw her. She's not had any episodes of chest pain or shortness breath. Her blood pressure readings have been normal.  She is tolerating the Xarelto fairly well. She has some occasional bleeding from the gums. She enjoys not having to check her INR levels.   She has occasional palpitations and heart racing. This typically can rest and these will resolve. She is able to wak about 10 minutes at a time.   Oct. 24, 2014:  June 23, 2013:  Kristine [name]'s BP is elevated. She had a recent echocardiogram that revealed a left ventricle systolic function that was at the lower limits of normal-50%. Study Conclusions  - Left ventricle: The cavity size was normal. Wall thickness was increased in a pattern of mild LVH. The estimated ejection fraction was 50%. Diffuse hypokinesis. Features are consistent with a pseudonormal left ventricular filling pattern, with concomitant abnormal relaxation and increased filling pressure (grade 2 diastolic dysfunction). - Aortic valve: There was no stenosis. - Mitral valve: Mildly calcified annulus. Mildly calcified leaflets . Trivial regurgitation. - Left atrium: The atrium was moderately dilated. - Right ventricle: The cavity size was normal. Systolic function was mildly reduced. - Right atrium: The atrium was moderately dilated. - Systemic veins: IVC measured 1.7 cm with < 50% respirophasic variation, suggesting RA pressure 8 mmHg. - Pericardium, extracardiac: A trivial pericardial effusion was identified posterior to the heart  She was  having some abdominal pain and   September 29, 2013:  Kristine [name] is doing well. Stressed about coming here.   Jan. 7, 2015:  Kristine [name] is a 69 yo with hx of Afib and diastolic dysfunction She converted to NSR in the hospital with flecainide. She was tried on [place] but her QT interval prolonged. Was changed to Flecainide and has maintained NSR.  She is exercising regularly - 30 minutes a day. Is on Lasix 80 mg a day and mtalazone 2. 5 once a week. Is still retaining fluid. Trying to avoid salt in food.    June 11, 2014:   Kristine [name] is a 69 y.o. female who presents for follow-up of her atrial fibrillation. She has had some issues with fluid retention and  has been on metalazone.     Past Medical History  Diagnosis Date  . Persistent atrial fibrillation   . Morbid obesity   . Pneumonia 2011  . Diabetes mellitus   . HTN (hypertension)   . Abnormal cardiovascular function study     MYOVIEW SHOWING ANTERIOR WALL ATTENUATION  . High cholesterol   . Scoliosis   . Spinal stenosis   . Anxiety   . Osteoarthritis   . Osteoporosis   . Mild aortic sclerosis   . Degenerative disc disease   . Vitamin D deficiency   . CHF (congestive heart failure)   . Pneumonia   . GERD (gastroesophageal reflux disease)   . Lichen planus   . Anasarca     Past Surgical History  Procedure Laterality Date  . Finger fracture surgery    . Cardiac catheterization  11/16/09  SMOOTH AND NORMAL  . Total abdominal hysterectomy    . Laparoscopic cholecystectomy    . Bladder suspension    . Colonoscopy  08/14/08     Current Outpatient Prescriptions  Medication Sig Dispense Refill  . ALPRAZolam (XANAX) 0.25 MG tablet Take 1/2- 1 tablet by mouth twice a a day as needed for anxiety  0  . esomeprazole (NEXIUM) 40 MG capsule Take 40 mg by mouth daily as needed (for acid reflux).     . flecainide (TAMBOCOR) 100 MG tablet Take 50 mg by mouth 2 (two) times daily.     . furosemide (LASIX) 80 MG tablet  Take 80 mg by mouth 2 (two) times daily.     . insulin glargine (LANTUS) 100 UNIT/ML injection Inject 45-48 Units into the skin 2 (two) times daily. Takes 48 units in the morning and 45 units at night    . KLOR-CON 10 10 MEQ tablet Take 20 mEq by mouth daily.  11  . losartan (COZAAR) 100 MG tablet TAKE 1 TABLET BY MOUTH EVERY DAY 90 tablet 0  . metFORMIN (GLUCOPHAGE) 500 MG tablet Take 500-1,000 mg by mouth 2 (two) times daily with a meal. 1000 mg in the AM and 500mg  at night    . metoprolol succinate (TOPROL-XL) 50 MG 24 hr tablet Take 1 tablet by mouth daily. Take with or immediately following a meal  5  . rivaroxaban (XARELTO) 20 MG TABS tablet Take 20 mg by mouth every morning.    . Vitamin D, Ergocalciferol, (DRISDOL) 50000 UNITS CAPS Take 50,000 Units by mouth every 7 (seven) days. Takes on Thursday    . metolazone (ZAROXOLYN) 2.5 MG tablet Take 2.5 mg by mouth daily. Take 30 minutes prior to morning dose of Furosmide     No current facility-administered medications for this visit.    Allergies:   Albuterol; Bee venom; Epinephrine; Ivp dye; and Penicillins    Social History:  The patient  reports that she has never smoked. She has never used smokeless tobacco. She reports that she does not drink alcohol or use illicit drugs.   Family History:  The patient's family history includes Atrial fibrillation in her father; CAD in her brother and sister; Congestive Heart Failure in her mother; Heart failure in her mother; Hypertension in her brother, father, mother, sister, and son; Stroke in her father.    ROS:  Please see the history of present illness.    Review of Systems: Constitutional:  denies fever, chills, diaphoresis, appetite change and fatigue.  HEENT: denies photophobia, eye pain, redness, hearing loss, ear pain, congestion, sore throat, rhinorrhea, sneezing, neck pain, neck stiffness and tinnitus.  Respiratory: denies SOB, DOE, cough, chest tightness, and wheezing.    Cardiovascular: denies chest pain, palpitations and leg swelling.  Gastrointestinal: denies nausea, vomiting, abdominal pain, diarrhea, constipation, blood in stool.  Genitourinary: denies dysuria, urgency, frequency, hematuria, flank pain and difficulty urinating.  Musculoskeletal: denies  myalgias, back pain, joint swelling, arthralgias and gait problem.   Skin: denies pallor, rash and wound.  Neurological: denies dizziness, seizures, syncope, weakness, light-headedness, numbness and headaches.   Hematological: denies adenopathy, easy bruising, personal or family bleeding history.  Psychiatric/ Behavioral: denies suicidal ideation, mood changes, confusion, nervousness, sleep disturbance and agitation.       All other systems are reviewed and negative.    PHYSICAL EXAM: VS:  BP 142/90 mmHg  Pulse 73  Ht 5\' 9"  (1.753 m)  Wt 252 lb 3.2 oz (114.397 kg)  BMI  37.23 kg/m2  SpO2 99% , BMI Body mass index is 37.23 kg/(m^2). GEN: Well nourished, well developed, in no acute distress HEENT: normal Neck: no JVD, carotid bruits, or masses Cardiac: RRR; no murmurs, rubs, or gallops,no edema  Respiratory:  clear to auscultation bilaterally, normal work of breathing GI: soft, nontender, nondistended, + BS MS: no deformity or atrophy Skin: warm and dry, no rash Neuro:  Strength and sensation are intact Psych: normal   EKG:  EKG is not ordered today.    Recent Labs: 11/09/2013: Hemoglobin 11.3*; Platelets 168 11/11/2013: Magnesium 2.1 06/01/2014: BUN 29*; Creatinine 1.40*; Potassium 3.7; Sodium 134*    Lipid Panel No results found for: CHOL, TRIG, HDL, CHOLHDL, VLDL, LDLCALC, LDLDIRECT    Wt Readings from Last 3 Encounters:  06/11/14 252 lb 3.2 oz (114.397 kg)  05/30/14 246 lb 12.8 oz (111.948 kg)  03/29/14 247 lb 1.6 oz (112.084 kg)      Other studies Reviewed: Additional studies/ records that were reviewed today include: . Review of the above records demonstrates:     ASSESSMENT AND PLAN:  1. Atrial fibrillation -  she's maintaining sinus rhythm. Continue flecainide. She still has not done her treadmill test year. We'll schedule that for May.  2. Diabetes mellitus  3. Chronic diastolic CHF - she continues to have shortness of breath. She still eats some additional salt. I've advised her to stay away from all salty foods. Continue same medications. She did not do well with the metolazone. She had lots of hypokalemia and episodes of dehydration. We will discontinue the metolazone and advise her to stay away from salt.    4. Obesity.   Encouraged diet and weight loss.   Current medicines are reviewed at length with the patient today.  The patient does not have concerns regarding medicines.  The following changes have been made:  no change  Labs/ tests ordered today include:  No orders of the defined types were placed in this encounter.     Disposition:   FU with me in 6 months.     Signed, Gloris Shiroma, Wonda Cheng, MD  06/11/2014 9:42 AM    [facility] [place], [place], [place]  [address] Phone: [phone]; Fax: [phone]

## 2014-07-27 ENCOUNTER — Encounter: Payer: Medicare Other | Admitting: Physician Assistant

## 2014-07-27 ENCOUNTER — Ambulatory Visit: Payer: Medicare Other | Admitting: Physician Assistant

## 2014-08-13 ENCOUNTER — Other Ambulatory Visit: Payer: Self-pay | Admitting: Cardiovascular Disease

## 2014-08-15 ENCOUNTER — Ambulatory Visit (INDEPENDENT_AMBULATORY_CARE_PROVIDER_SITE_OTHER): Payer: Medicare Other

## 2014-08-15 ENCOUNTER — Encounter: Payer: Medicare Other | Admitting: Physician Assistant

## 2014-08-15 DIAGNOSIS — I481 Persistent atrial fibrillation: Secondary | ICD-10-CM | POA: Diagnosis not present

## 2014-08-15 DIAGNOSIS — I119 Hypertensive heart disease without heart failure: Secondary | ICD-10-CM

## 2014-08-15 DIAGNOSIS — I4819 Other persistent atrial fibrillation: Secondary | ICD-10-CM

## 2014-08-15 LAB — EXERCISE TOLERANCE TEST
CHL CUP MPHR: 153 {beats}/min
CHL RATE OF PERCEIVED EXERTION: 15
CSEPED: 2 min
CSEPEDS: 13 s
Estimated workload: 2.3 METS
Peak HR: 83 {beats}/min
Percent HR: 54 %
Rest HR: 57 {beats}/min

## 2014-09-02 ENCOUNTER — Other Ambulatory Visit: Payer: Self-pay | Admitting: Cardiovascular Disease

## 2014-11-08 ENCOUNTER — Other Ambulatory Visit: Payer: Self-pay | Admitting: Internal Medicine

## 2014-11-26 ENCOUNTER — Other Ambulatory Visit: Payer: Self-pay | Admitting: Internal Medicine

## 2014-11-26 ENCOUNTER — Other Ambulatory Visit: Payer: Self-pay | Admitting: Cardiovascular Disease

## 2014-12-10 ENCOUNTER — Encounter: Payer: Self-pay | Admitting: Cardiovascular Disease

## 2014-12-10 ENCOUNTER — Ambulatory Visit (INDEPENDENT_AMBULATORY_CARE_PROVIDER_SITE_OTHER): Payer: Medicare Other | Admitting: Cardiovascular Disease

## 2014-12-10 VITALS — BP 132/88 | HR 82 | Ht 69.0 in | Wt 253.1 lb

## 2014-12-10 DIAGNOSIS — I481 Persistent atrial fibrillation: Secondary | ICD-10-CM

## 2014-12-10 DIAGNOSIS — I4819 Other persistent atrial fibrillation: Secondary | ICD-10-CM

## 2014-12-10 DIAGNOSIS — I4891 Unspecified atrial fibrillation: Secondary | ICD-10-CM

## 2014-12-10 DIAGNOSIS — I5032 Chronic diastolic (congestive) heart failure: Secondary | ICD-10-CM

## 2014-12-10 LAB — BASIC METABOLIC PANEL
BUN: 32 mg/dL — AB (ref 6–23)
CALCIUM: 9.5 mg/dL (ref 8.4–10.5)
CO2: 31 mEq/L (ref 19–32)
Chloride: 97 mEq/L (ref 96–112)
Creatinine, Ser: 1.39 mg/dL — ABNORMAL HIGH (ref 0.40–1.20)
GFR: 40.1 mL/min — ABNORMAL LOW (ref >60.00)
GLUCOSE: 225 mg/dL — AB (ref 70–99)
Potassium: 3.7 mEq/L (ref 3.5–5.1)
Sodium: 138 mEq/L (ref 135–145)

## 2014-12-10 MED ORDER — FLECAINIDE ACETATE 100 MG PO TABS: 100.0000 mg | ORAL_TABLET | Freq: Two times a day (BID) | ORAL | Status: DC

## 2014-12-10 NOTE — Patient Instructions (Signed)
Medication Instructions:  INCREASE Flecainide to 100 mg twice daily   Labwork: TODAY - basic metabolic  Panel   Testing/Procedures: None Ordered   Follow-Up: Your physician recommends that you schedule a follow-up appointment in: 3-4 weeks with Roderic Palau, NP at [facility] physician recommends that you schedule a follow-up appointment in: 3 months with Dr. Acie Fredrickson

## 2014-12-10 NOTE — Progress Notes (Signed)
Cardiology Office Note   Date:  12/10/2014   ID:  Kristine [name], DOB Sep 22, 1945, MRN [id number]  PCP:  Tivis Ringer, MD  Cardiologist:   Thayer Headings, MD   Chief Complaint  Patient presents with  . Follow-up   1. Atrial fibrillation 2. Diabetes mellitus 3. Chronic diastolic CHF 4. Obesity.   History of Present Illness:  Kristine [name] is a 69 year old female with a history of atrial fibrillation. She has done well since I last saw her. She's not had any episodes of chest pain or shortness breath. Her blood pressure readings have been normal.  She is tolerating the Xarelto fairly well. She has some occasional bleeding from the gums. She enjoys not having to check her INR levels.   She has occasional palpitations and heart racing. This typically can rest and these will resolve. She is able to wak about 10 minutes at a time.   Oct. 24, 2014:  June 23, 2013:  [name]'s BP is elevated. She had a recent echocardiogram that revealed a left ventricle systolic function that was at the lower limits of normal-50%. Study Conclusions  - Left ventricle: The cavity size was normal. Wall thickness was increased in a pattern of mild LVH. The estimated ejection fraction was 50%. Diffuse hypokinesis. Features are consistent with a pseudonormal left ventricular filling pattern, with concomitant abnormal relaxation and increased filling pressure (grade 2 diastolic dysfunction). - Aortic valve: There was no stenosis. - Mitral valve: Mildly calcified annulus. Mildly calcified leaflets . Trivial regurgitation. - Left atrium: The atrium was moderately dilated. - Right ventricle: The cavity size was normal. Systolic function was mildly reduced. - Right atrium: The atrium was moderately dilated. - Systemic veins: IVC measured 1.7 cm with < 50% respirophasic variation, suggesting RA pressure 8 mmHg. - Pericardium, extracardiac: A trivial pericardial effusion was identified  posterior to the heart  She was having some abdominal pain and   September 29, 2013:  Kristine [name] is doing well. Stressed about coming here.   Jan. 7, 2015:  Kristine [name] is a 69 yo with hx of Afib and diastolic dysfunction She converted to NSR in the hospital with flecainide. She was tried on [place] but her QT interval prolonged. Was changed to Flecainide and has maintained NSR.  She is exercising regularly - 30 minutes a day. Is on Lasix 80 mg a day and mtalazone 2. 5 once a week. Is still retaining fluid. Trying to avoid salt in food.    June 11, 2014:   Kristine [name] is a 69 y.o. female who presents for follow-up of her atrial fibrillation. She has had some issues with fluid retention and  has been on metalazone.   Sept. 19, 2016:  Doing well.  Has had some shortness of breath - improved with increasing lasix to 80 BID .  BP is still high this am.  BP at home has been normal .   Past Medical History  Diagnosis Date  . Persistent atrial fibrillation   . Morbid obesity   . Pneumonia 2011  . Diabetes mellitus   . HTN (hypertension)   . Abnormal cardiovascular function study     MYOVIEW SHOWING ANTERIOR WALL ATTENUATION  . High cholesterol   . Scoliosis   . Spinal stenosis   . Anxiety   . Osteoarthritis   . Osteoporosis   . Mild aortic sclerosis   . Degenerative disc disease   . Vitamin D deficiency   . CHF (congestive heart failure)   .  Pneumonia   . GERD (gastroesophageal reflux disease)   . Lichen planus   . Anasarca     Past Surgical History  Procedure Laterality Date  . Finger fracture surgery    . Cardiac catheterization  11/16/09    SMOOTH AND NORMAL  . Total abdominal hysterectomy    . Laparoscopic cholecystectomy    . Bladder suspension    . Colonoscopy  08/14/08     Current Outpatient Prescriptions  Medication Sig Dispense Refill  . ALPRAZolam (XANAX) 0.25 MG tablet Take 1/2- 1 tablet by mouth twice a a day as needed for anxiety  0  . esomeprazole  (NEXIUM) 40 MG capsule Take 40 mg by mouth daily as needed (for acid reflux).     . flecainide (TAMBOCOR) 100 MG tablet Take 50 mg by mouth 2 (two) times daily.     . furosemide (LASIX) 80 MG tablet Take 80 mg by mouth 2 (two) times daily.     Marland Kitchen KLOR-CON 10 10 MEQ tablet Take 20 mEq by mouth daily.  11  . LANTUS SOLOSTAR 100 UNIT/ML Solostar Pen Inject 50 Units as directed 2 (two) times daily.  5  . losartan (COZAAR) 100 MG tablet TAKE 1 TABLET BY MOUTH EVERY DAY 90 tablet 1  . metFORMIN (GLUCOPHAGE) 500 MG tablet Take 500-1,000 mg by mouth 2 (two) times daily with a meal. 1000 mg in the AM and 500mg  at night    . metolazone (ZAROXOLYN) 2.5 MG tablet Take 2.5 mg by mouth daily.    . metoprolol succinate (TOPROL-XL) 50 MG 24 hr tablet Take 1 tablet by mouth daily. Take with or immediately following a meal  5  . OVER THE COUNTER MEDICATION Take by mouth daily. MUSCADINE SEED QD    . rivaroxaban (XARELTO) 20 MG TABS tablet Take 20 mg by mouth every morning.    . temazepam (RESTORIL) 15 MG capsule Take 15 mg by mouth at bedtime as needed for sleep.    . Vitamin D, Ergocalciferol, (DRISDOL) 50000 UNITS CAPS Take 50,000 Units by mouth every 7 (seven) days. Takes on Thursday    . XOPENEX HFA 45 MCG/ACT inhaler Take 45 mcg by mouth daily as needed. INHALE 1 PUFF INTO LUNGS UP TO FOUR TIMES DAILY FOR WHEEZING  6   No current facility-administered medications for this visit.    Allergies:   Albuterol; Bee venom; Epinephrine; Ivp dye; and Penicillins    Social History:  The patient  reports that she has never smoked. She has never used smokeless tobacco. She reports that she does not drink alcohol or use illicit drugs.   Family History:  The patient's family history includes Atrial fibrillation in her father; CAD in her brother and sister; Congestive Heart Failure in her mother; Heart failure in her mother; Hypertension in her brother, father, mother, sister, and son; Stroke in her father.    ROS:   Please see the history of present illness.    Review of Systems: Constitutional:  denies fever, chills, diaphoresis, appetite change and fatigue.  HEENT: denies photophobia, eye pain, redness, hearing loss, ear pain, congestion, sore throat, rhinorrhea, sneezing, neck pain, neck stiffness and tinnitus.  Respiratory: denies SOB, DOE, cough, chest tightness, and wheezing.  Cardiovascular: denies chest pain, palpitations and leg swelling.  Gastrointestinal: denies nausea, vomiting, abdominal pain, diarrhea, constipation, blood in stool.  Genitourinary: denies dysuria, urgency, frequency, hematuria, flank pain and difficulty urinating.  Musculoskeletal: denies  myalgias, back pain, joint swelling, arthralgias and gait problem.  Skin: denies pallor, rash and wound.  Neurological: denies dizziness, seizures, syncope, weakness, light-headedness, numbness and headaches.   Hematological: denies adenopathy, easy bruising, personal or family bleeding history.  Psychiatric/ Behavioral: denies suicidal ideation, mood changes, confusion, nervousness, sleep disturbance and agitation.       All other systems are reviewed and negative.    PHYSICAL EXAM: VS:  BP 132/88 mmHg  Pulse 82  Ht 5\' 9"  (1.753 m)  Wt 114.814 kg (253 lb 1.9 oz)  BMI 37.36 kg/m2  SpO2 98% , BMI Body mass index is 37.36 kg/(m^2). GEN: Well nourished, well developed, in no acute distress HEENT: normal Neck: no JVD, carotid bruits, or masses Cardiac: Irreg. Irreg. ; no murmurs, rubs, or gallops,no edema  Respiratory:  clear to auscultation bilaterally, normal work of breathing GI: soft, nontender, nondistended, + BS MS: no deformity or atrophy Skin: warm and dry, no rash Neuro:  Strength and sensation are intact Psych: normal   EKG:  EKG is not ordered today.    Recent Labs: 06/11/2014: BUN 17; Creatinine, Ser 1.18; Potassium 3.9; Sodium 140    Lipid Panel No results found for: CHOL, TRIG, HDL, CHOLHDL, VLDL,  LDLCALC, LDLDIRECT    Wt Readings from Last 3 Encounters:  12/10/14 114.814 kg (253 lb 1.9 oz)  06/11/14 114.397 kg (252 lb 3.2 oz)  05/30/14 111.948 kg (246 lb 12.8 oz)      Other studies Reviewed: Additional studies/ records that were reviewed today include: . Review of the above records demonstrates:    ASSESSMENT AND PLAN:  1. Atrial fibrillation -    She is back in a-fib.  Will increase Flecainide to 100 bid .  Will have her go to a-fib clinic in 3-4 weeks.   She does not want to have a cardioversion right now but wants to see if the increased Flecainide works.   2. Diabetes mellitus  3. Chronic diastolic CHF - better on LAsix 80 bid .  Continue .  Will check BMP today   4. Obesity.   Encouraged diet and weight loss.   Current medicines are reviewed at length with the patient today.  The patient does not have concerns regarding medicines.  The following changes have been made:  no change  Labs/ tests ordered today include:  No orders of the defined types were placed in this encounter.     Disposition:   FU with me in 3 months.     Signed, Nahser, Wonda Cheng, MD  12/10/2014 9:18 AM    [facility] [address], [place], [place]  [address] Phone: [phone]; Fax: [phone]

## 2014-12-31 ENCOUNTER — Ambulatory Visit (HOSPITAL_COMMUNITY)
Admission: RE | Admit: 2014-12-31 | Discharge: 2014-12-31 | Disposition: A | Discharge status: Home / Self Care | Payer: Medicare Other | Source: Ambulatory Visit | Attending: Nurse Practitioner | Admitting: Nurse Practitioner

## 2014-12-31 DIAGNOSIS — I481 Persistent atrial fibrillation: Secondary | ICD-10-CM | POA: Diagnosis not present

## 2014-12-31 DIAGNOSIS — I5032 Chronic diastolic (congestive) heart failure: Secondary | ICD-10-CM | POA: Diagnosis not present

## 2014-12-31 DIAGNOSIS — R0683 Snoring: Secondary | ICD-10-CM | POA: Diagnosis not present

## 2014-12-31 DIAGNOSIS — I4819 Other persistent atrial fibrillation: Secondary | ICD-10-CM

## 2014-12-31 DIAGNOSIS — E669 Obesity, unspecified: Secondary | ICD-10-CM | POA: Insufficient documentation

## 2014-12-31 DIAGNOSIS — I119 Hypertensive heart disease without heart failure: Secondary | ICD-10-CM | POA: Insufficient documentation

## 2014-12-31 LAB — MAGNESIUM: MAGNESIUM: 1.8 mg/dL (ref 1.7–2.4)

## 2014-12-31 LAB — BASIC METABOLIC PANEL
Anion gap: 17 — ABNORMAL HIGH (ref 5–15)
BUN: 27 mg/dL — ABNORMAL HIGH (ref 6–20)
CO2: 26 mmol/L (ref 22–32)
CREATININE: 1.49 mg/dL — AB (ref 0.44–1.00)
Calcium: 9.5 mg/dL (ref 8.9–10.3)
Chloride: 96 mmol/L — ABNORMAL LOW (ref 101–111)
GFR, EST AFRICAN AMERICAN: 41 mL/min — AB (ref >60)
GFR, EST NON AFRICAN AMERICAN: 35 mL/min — AB (ref >60)
Glucose, Bld: 185 mg/dL — ABNORMAL HIGH (ref 65–99)
Potassium: 3.5 mmol/L (ref 3.5–5.1)
SODIUM: 139 mmol/L (ref 135–145)

## 2014-12-31 LAB — CBC
HCT: 41 % (ref 36.0–46.0)
Hemoglobin: 13.2 g/dL (ref 12.0–15.0)
MCH: 28.3 pg (ref 26.0–34.0)
MCHC: 32.2 g/dL (ref 30.0–36.0)
MCV: 88 fL (ref 78.0–100.0)
PLATELETS: 246 10*3/uL (ref 150–400)
RBC: 4.66 MIL/uL (ref 3.87–5.11)
RDW: 14 % (ref 11.5–15.5)
WBC: 13.1 10*3/uL — AB (ref 4.0–10.5)

## 2014-12-31 LAB — TSH: TSH: 3.446 u[IU]/mL (ref 0.350–4.500)

## 2014-12-31 MED ORDER — METOPROLOL SUCCINATE ER 50 MG PO TB24: ORAL_TABLET | ORAL | Status: DC

## 2014-12-31 NOTE — Patient Instructions (Signed)
Cardioversion scheduled for Monday, October 17th  - Arrive at the [facility] and go to admitting at [facility] not eat or drink anything after midnight the night prior to your procedure.  - Take all your medication with a sip of water prior to arrival.  - You will not be able to drive home after your procedure.

## 2015-01-01 NOTE — Progress Notes (Signed)
Patient ID: Kristine [name], female   DOB: Feb 28, 1946, 69 y.o.   MRN: [id number]     Primary Care Physician: Tivis Ringer, MD Referring Physician:  Dr Georgina Peer is a 69 y.o. female with a h/o obesity, DM, HTN, and persistent afib who presents for f/u in afib clinic. She reports being in good health until 2011 when she developed pneumonia.  She says that while receiving a breathing treatment that she developed rapid irregular palpitations.  She has been in atrial fibrillation since that time.  An attempt at cardioversion was made about 3 years ago which was unsuccessful.  She has been treated with rate control since that time.   Treatment options were given to pt and she chose thospitalization for Tikosyn load, However drug had to be discontinued due to prolonged QT. She was then scheduled for a stress test to r/o CAD with thoughts of starting Flecainide, which showed low risk scan. She also had issues with fluid retention while off diuretic but is back to baseline with resumption of 80 mg lasix qd with  rare use of metolazone.  She is currently on  flecainide 100 mg bid, being increased when being seen with Dr. Cathie Olden in September. He wanted to cardiovert the pt then, but she deferred . She is was weaned off antidepressant due to known long QT.  She returns today with continuation on flecainide but believes she has been in afib since that visit. Continues with tendency for long QT.   Issues with fluid retention with tendency to overuse diuretics at times.Still appropriately anticoagulated with xarelto 20 mg a day, no missed doses.No bleeding issues.Sleeps poorly and sounds like she may have sleep apnea, but pt declines sleep study. She is upset in the office today and crying due to the fact that she is having to take multiple insulin shots a day to control her diabetes. She has had many siblings to die from complications secondary to DM and is just mad that she has this  disease.  Today, she denies symptoms of chest pain, orthopnea, PND,  Positive for chronic lower extremity edema, well managed with current diuretic, no dizziness, presyncope, syncope, or neurologic sequela. The patient is tolerating medications without difficulties and is otherwise without complaint today.   Past Medical History  Diagnosis Date  . Persistent atrial fibrillation   . Morbid obesity   . Pneumonia 2011  . Diabetes mellitus   . HTN (hypertension)   . Abnormal cardiovascular function study     MYOVIEW SHOWING ANTERIOR WALL ATTENUATION  . High cholesterol   . Scoliosis   . Spinal stenosis   . Anxiety   . Osteoarthritis   . Osteoporosis   . Mild aortic sclerosis   . Degenerative disc disease   . Vitamin D deficiency   . CHF (congestive heart failure)   . Pneumonia   . GERD (gastroesophageal reflux disease)   . Lichen planus   . Anasarca    Past Surgical History  Procedure Laterality Date  . Finger fracture surgery    . Cardiac catheterization  11/16/09    SMOOTH AND NORMAL  . Total abdominal hysterectomy    . Laparoscopic cholecystectomy    . Bladder suspension    . Colonoscopy  08/14/08    Current Outpatient Prescriptions  Medication Sig Dispense Refill  . ALPRAZolam (XANAX) 0.25 MG tablet Take 1/2- 1 tablet by mouth twice a a day as needed for anxiety  0  . esomeprazole ([place])  40 MG capsule Take 40 mg by mouth daily as needed (for acid reflux).     . flecainide (TAMBOCOR) 100 MG tablet Take 1 tablet (100 mg total) by mouth 2 (two) times daily. 60 tablet 11  . furosemide (LASIX) 80 MG tablet Take 80 mg by mouth 2 (two) times daily.     Marland Kitchen KLOR-CON 10 10 MEQ tablet Take 20 mEq by mouth daily.  11  . LANTUS SOLOSTAR 100 UNIT/ML Solostar Pen Inject 50 Units as directed 2 (two) times daily.  5  . losartan (COZAAR) 100 MG tablet TAKE 1 TABLET BY MOUTH EVERY DAY 90 tablet 1  . metFORMIN (GLUCOPHAGE) 500 MG tablet Take 500-1,000 mg by mouth 2 (two) times daily with a  meal. 1000 mg in the AM and 500mg  at night    . metolazone (ZAROXOLYN) 2.5 MG tablet Take 2.5 mg by mouth daily.    . metoprolol succinate (TOPROL-XL) 50 MG 24 hr tablet Take 1 tablet in the morning and 1/2 tablet in the evening. 45 tablet 1  . OVER THE COUNTER MEDICATION Take by mouth daily. MUSCADINE SEED QD    . rivaroxaban (XARELTO) 20 MG TABS tablet Take 20 mg by mouth every morning.    . temazepam (RESTORIL) 15 MG capsule Take 15 mg by mouth at bedtime as needed for sleep.    . Vitamin D, Ergocalciferol, (DRISDOL) 50000 UNITS CAPS Take 50,000 Units by mouth every 7 (seven) days. Takes on Thursday    . XOPENEX HFA 45 MCG/ACT inhaler Take 45 mcg by mouth daily as needed. INHALE 1 PUFF INTO LUNGS UP TO FOUR TIMES DAILY FOR WHEEZING  6  . BD PEN NEEDLE NANO U/F 32G X 4 MM MISC Inject 200 Units as directed 3 (three) times daily.  11   No current facility-administered medications for this encounter.    Allergies  Allergen Reactions  . Albuterol     REACTION: Tachycardia- AFib  . Bee Venom Other (See Comments)    "makes me nervous"  . Epinephrine Other (See Comments)    Increases heart rate per patient.   Clementeen Hoof [Iodinated Diagnostic Agents] Other (See Comments)    flushing  . Penicillins     REACTION: flushing \\T \ hot    Social History   Social History  . Marital Status: Married    Spouse Name: N/A  . Number of Children: N/A  . Years of Education: N/A   Occupational History  . Not on file.   Social History Main Topics  . Smoking status: Never Smoker   . Smokeless tobacco: Never Used  . Alcohol Use: No  . Drug Use: No  . Sexual Activity: Not on file   Other Topics Concern  . Not on file   Social History Narrative   Pt lives in [place] with spouse.   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    Family History  Problem Relation Age of Onset  . Stroke Father   .  Hypertension Father   . Atrial fibrillation Father     HAD MURMUR  . Heart failure Mother   . Hypertension Brother   . Hypertension Sister   . Hypertension Son   . Congestive Heart Failure Mother   . Hypertension Mother   . CAD Sister     EARLY  . CAD Brother     EARLY  ROS- All systems are reviewed and negative except as per the HPI above  Physical Exam: Filed Vitals:   12/31/14 1138  BP: 130/84  Pulse: 118  Height: 5\' 8"  (1.727 m)  Weight: 257 lb (116.574 kg)    GEN- The patient is morbidly obese appearing, alert and oriented x 3 today.   Head- normocephalic, atraumatic Eyes-  Sclera clear, conjunctiva pink Ears- hearing intact Oropharynx- clear Neck- supple,  Lungs- Clear to ausculation bilaterally, normal work of breathing Heart- irregular rate and rhythm  GI- soft, NT, ND, + BS Extremities- no clubbing, cyanosis, + trace edema MS- no significant deformity or atrophy Skin- no rash or lesion Psych- euthymic mood, full affect Neuro- strength and sensation are intact  EKG today reveals afib with rvr 118 bpm, qrs int 108 ms, qtc int 557 ms, reviewed with Dr. Rayann Heman EPIC records reviewed     Assessment and Plan:  1.  Longstanding persistent afib Persistent afib despite increase to flecainide 100 mg bid  She is willing to be set up for cardioversion Long QT- avoid QT prolonging drugs No missed doses of xarelto Procedure labs today  2. Hypertensive cardiovascular disease  Bp normal today. Avoid salt. Diuretics to control fluid as prescribed, but cautioned not to overuse, especially metolazone.  3. Obesity Body mass index is 39.09 kg/(m^2). Weight loss is strongly encouraged  4. Diastolic dysfunction Appears stable.  5. Snoring history Declines sleep study   F/u in one week after cardioversion  Butch Penny C. Cordel Drewes, [facility] [address] [place], [place] [address] [phone]

## 2015-01-07 ENCOUNTER — Ambulatory Visit (HOSPITAL_COMMUNITY): Payer: Medicare Other | Admitting: Certified Registered"

## 2015-01-07 ENCOUNTER — Encounter (HOSPITAL_COMMUNITY): Payer: Self-pay

## 2015-01-07 ENCOUNTER — Ambulatory Visit (HOSPITAL_COMMUNITY)
Admission: RE | Admit: 2015-01-07 | Discharge: 2015-01-07 | Disposition: A | Discharge status: Home / Self Care | Payer: Medicare Other | Source: Ambulatory Visit | Attending: Cardiovascular Disease | Admitting: Cardiovascular Disease

## 2015-01-07 ENCOUNTER — Encounter (HOSPITAL_COMMUNITY)
Admission: RE | Disposition: A | Discharge status: Home / Self Care | Payer: Self-pay | Source: Ambulatory Visit | Attending: Cardiovascular Disease

## 2015-01-07 DIAGNOSIS — I481 Persistent atrial fibrillation: Secondary | ICD-10-CM | POA: Diagnosis present

## 2015-01-07 DIAGNOSIS — Z79899 Other long term (current) drug therapy: Secondary | ICD-10-CM | POA: Diagnosis not present

## 2015-01-07 DIAGNOSIS — E559 Vitamin D deficiency, unspecified: Secondary | ICD-10-CM | POA: Insufficient documentation

## 2015-01-07 DIAGNOSIS — Z7901 Long term (current) use of anticoagulants: Secondary | ICD-10-CM | POA: Diagnosis not present

## 2015-01-07 DIAGNOSIS — Z7984 Long term (current) use of oral hypoglycemic drugs: Secondary | ICD-10-CM | POA: Insufficient documentation

## 2015-01-07 DIAGNOSIS — I480 Paroxysmal atrial fibrillation: Secondary | ICD-10-CM | POA: Diagnosis not present

## 2015-01-07 DIAGNOSIS — M1990 Unspecified osteoarthritis, unspecified site: Secondary | ICD-10-CM | POA: Insufficient documentation

## 2015-01-07 DIAGNOSIS — E7800 Pure hypercholesterolemia, unspecified: Secondary | ICD-10-CM | POA: Diagnosis not present

## 2015-01-07 DIAGNOSIS — K219 Gastro-esophageal reflux disease without esophagitis: Secondary | ICD-10-CM | POA: Diagnosis not present

## 2015-01-07 DIAGNOSIS — Z8249 Family history of ischemic heart disease and other diseases of the circulatory system: Secondary | ICD-10-CM | POA: Diagnosis not present

## 2015-01-07 DIAGNOSIS — Z6839 Body mass index (BMI) 39.0-39.9, adult: Secondary | ICD-10-CM | POA: Insufficient documentation

## 2015-01-07 DIAGNOSIS — I509 Heart failure, unspecified: Secondary | ICD-10-CM | POA: Diagnosis not present

## 2015-01-07 DIAGNOSIS — E119 Type 2 diabetes mellitus without complications: Secondary | ICD-10-CM | POA: Insufficient documentation

## 2015-01-07 DIAGNOSIS — I110 Hypertensive heart disease with heart failure: Secondary | ICD-10-CM | POA: Diagnosis not present

## 2015-01-07 DIAGNOSIS — Z794 Long term (current) use of insulin: Secondary | ICD-10-CM | POA: Insufficient documentation

## 2015-01-07 HISTORY — PX: CARDIOVERSION: SHX1299

## 2015-01-07 LAB — GLUCOSE, CAPILLARY
GLUCOSE-CAPILLARY: 154 mg/dL — AB (ref 65–99)
GLUCOSE-CAPILLARY: 121 mg/dL — AB (ref 65–99)

## 2015-01-07 SURGERY — CARDIOVERSION
Anesthesia: General

## 2015-01-07 MED ORDER — LIDOCAINE HCL (CARDIAC) 20 MG/ML IV SOLN
INTRAVENOUS | Status: DC | PRN
  Administered 2015-01-07: 40 mg via INTRAVENOUS

## 2015-01-07 MED ORDER — PROPOFOL 10 MG/ML IV BOLUS
INTRAVENOUS | Status: DC | PRN
  Administered 2015-01-07: 50 mg via INTRAVENOUS

## 2015-01-07 MED ORDER — SODIUM CHLORIDE 0.9 % IV SOLN
INTRAVENOUS | Status: DC | PRN
  Administered 2015-01-07: 10:00:00 via INTRAVENOUS

## 2015-01-07 NOTE — CV Procedure (Signed)
Electrical Cardioversion Procedure Note LOUSIE ROSATO [id number] 05-30-1945  Procedure: Electrical Cardioversion Indications:  Atrial Fibrillation  Procedure Details Consent: Risks of procedure as well as the alternatives and risks of each were explained to the (patient/caregiver).  Consent for procedure obtained. Time Out: Verified patient identification, verified procedure, site/side was marked, verified correct patient position, special equipment/implants available, medications/allergies/relevent history reviewed, required imaging and test results available.  Performed  Patient placed on cardiac monitor, pulse oximetry, supplemental oxygen as necessary.  Sedation given: propofol Pacer pads placed anterior and posterior chest.  Cardioverted 1 time(s).  Cardioverted at [place].  Evaluation Findings: Post procedure EKG shows: sinus bradycardia with PACs Complications: None Patient did tolerate procedure well.   Sharol Harness, MD 01/07/2015, 10:20 AM

## 2015-01-07 NOTE — Anesthesia Postprocedure Evaluation (Signed)
  Anesthesia Post-op Note  Patient: Kristine [name]  Procedure(s) Performed: Procedure(s): CARDIOVERSION (N/A)  Patient Location: PACU  Anesthesia Type:General  Level of Consciousness: awake, sedated and patient cooperative  Airway and Oxygen Therapy: Patient Spontanous Breathing  Post-op Pain: none  Post-op Assessment: Post-op Vital signs reviewed, Patient's Cardiovascular Status Stable, Respiratory Function Stable, Patent Airway, No signs of Nausea or vomiting and Pain level controlled              Post-op Vital Signs: stable  Last Vitals:  Filed Vitals:   01/07/15 1035  BP: 92/53  Pulse: 50  Temp:   Resp: 12    Complications: No apparent anesthesia complications

## 2015-01-07 NOTE — Transfer of Care (Signed)
Immediate Anesthesia Transfer of Care Note  Patient: Kristine [name]  Procedure(s) Performed: Procedure(s): CARDIOVERSION (N/A)  Patient Location: Endoscopy Unit  Anesthesia Type:General  Level of Consciousness: awake, oriented and patient cooperative  Airway & Oxygen Therapy: Patient Spontanous Breathing and Patient connected to nasal cannula oxygen  Post-op Assessment: Report given to RN, Post -op Vital signs reviewed and stable and Patient moving all extremities  Post vital signs: Reviewed and stable  Last Vitals:  Filed Vitals:   01/07/15 1021  BP:   Pulse: 49  Resp: 19    Complications: No apparent anesthesia complications

## 2015-01-07 NOTE — H&P (View-Only) (Signed)
Patient ID: Kristine [name], female   DOB: 08-12-1945, 69 y.o.   MRN: [id number]     Primary Care Physician: Tivis Ringer, MD Referring Physician:  Dr Georgina Peer is a 69 y.o. female with a h/o obesity, DM, HTN, and persistent afib who presents for f/u in afib clinic. She reports being in good health until 2011 when she developed pneumonia.  She says that while receiving a breathing treatment that she developed rapid irregular palpitations.  She has been in atrial fibrillation since that time.  An attempt at cardioversion was made about 3 years ago which was unsuccessful.  She has been treated with rate control since that time.   Treatment options were given to pt and she chose thospitalization for Tikosyn load, However drug had to be discontinued due to prolonged QT. She was then scheduled for a stress test to r/o CAD with thoughts of starting Flecainide, which showed low risk scan. She also had issues with fluid retention while off diuretic but is back to baseline with resumption of 80 mg lasix qd with  rare use of metolazone.  She is currently on  flecainide 100 mg bid, being increased when being seen with Dr. Cathie Olden in September. He wanted to cardiovert the pt then, but she deferred . She is was weaned off antidepressant due to known long QT.  She returns today with continuation on flecainide but believes she has been in afib since that visit. Continues with tendency for long QT.   Issues with fluid retention with tendency to overuse diuretics at times.Still appropriately anticoagulated with xarelto 20 mg a day, no missed doses.No bleeding issues.Sleeps poorly and sounds like she may have sleep apnea, but pt declines sleep study. She is upset in the office today and crying due to the fact that she is having to take multiple insulin shots a day to control her diabetes. She has had many siblings to die from complications secondary to DM and is just mad that she has this  disease.  Today, she denies symptoms of chest pain, orthopnea, PND,  Positive for chronic lower extremity edema, well managed with current diuretic, no dizziness, presyncope, syncope, or neurologic sequela. The patient is tolerating medications without difficulties and is otherwise without complaint today.   Past Medical History  Diagnosis Date  . Persistent atrial fibrillation   . Morbid obesity   . Pneumonia 2011  . Diabetes mellitus   . HTN (hypertension)   . Abnormal cardiovascular function study     MYOVIEW SHOWING ANTERIOR WALL ATTENUATION  . High cholesterol   . Scoliosis   . Spinal stenosis   . Anxiety   . Osteoarthritis   . Osteoporosis   . Mild aortic sclerosis   . Degenerative disc disease   . Vitamin D deficiency   . CHF (congestive heart failure)   . Pneumonia   . GERD (gastroesophageal reflux disease)   . Lichen planus   . Anasarca    Past Surgical History  Procedure Laterality Date  . Finger fracture surgery    . Cardiac catheterization  11/16/09    SMOOTH AND NORMAL  . Total abdominal hysterectomy    . Laparoscopic cholecystectomy    . Bladder suspension    . Colonoscopy  08/14/08    Current Outpatient Prescriptions  Medication Sig Dispense Refill  . ALPRAZolam (XANAX) 0.25 MG tablet Take 1/2- 1 tablet by mouth twice a a day as needed for anxiety  0  . esomeprazole ([place])  40 MG capsule Take 40 mg by mouth daily as needed (for acid reflux).     . flecainide (TAMBOCOR) 100 MG tablet Take 1 tablet (100 mg total) by mouth 2 (two) times daily. 60 tablet 11  . furosemide (LASIX) 80 MG tablet Take 80 mg by mouth 2 (two) times daily.     Marland Kitchen KLOR-CON 10 10 MEQ tablet Take 20 mEq by mouth daily.  11  . LANTUS SOLOSTAR 100 UNIT/ML Solostar Pen Inject 50 Units as directed 2 (two) times daily.  5  . losartan (COZAAR) 100 MG tablet TAKE 1 TABLET BY MOUTH EVERY DAY 90 tablet 1  . metFORMIN (GLUCOPHAGE) 500 MG tablet Take 500-1,000 mg by mouth 2 (two) times daily with a  meal. 1000 mg in the AM and 500mg  at night    . metolazone (ZAROXOLYN) 2.5 MG tablet Take 2.5 mg by mouth daily.    . metoprolol succinate (TOPROL-XL) 50 MG 24 hr tablet Take 1 tablet in the morning and 1/2 tablet in the evening. 45 tablet 1  . OVER THE COUNTER MEDICATION Take by mouth daily. MUSCADINE SEED QD    . rivaroxaban (XARELTO) 20 MG TABS tablet Take 20 mg by mouth every morning.    . temazepam (RESTORIL) 15 MG capsule Take 15 mg by mouth at bedtime as needed for sleep.    . Vitamin D, Ergocalciferol, (DRISDOL) 50000 UNITS CAPS Take 50,000 Units by mouth every 7 (seven) days. Takes on Thursday    . XOPENEX HFA 45 MCG/ACT inhaler Take 45 mcg by mouth daily as needed. INHALE 1 PUFF INTO LUNGS UP TO FOUR TIMES DAILY FOR WHEEZING  6  . BD PEN NEEDLE NANO U/F 32G X 4 MM MISC Inject 200 Units as directed 3 (three) times daily.  11   No current facility-administered medications for this encounter.    Allergies  Allergen Reactions  . Albuterol     REACTION: Tachycardia- AFib  . Bee Venom Other (See Comments)    "makes me nervous"  . Epinephrine Other (See Comments)    Increases heart rate per patient.   Clementeen Hoof [Iodinated Diagnostic Agents] Other (See Comments)    flushing  . Penicillins     REACTION: flushing \\T \ hot    Social History   Social History  . Marital Status: Married    Spouse Name: N/A  . Number of Children: N/A  . Years of Education: N/A   Occupational History  . Not on file.   Social History Main Topics  . Smoking status: Never Smoker   . Smokeless tobacco: Never Used  . Alcohol Use: No  . Drug Use: No  . Sexual Activity: Not on file   Other Topics Concern  . Not on file   Social History Narrative   Pt lives in [place] with spouse.   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    Family History  Problem Relation Age of Onset  . Stroke Father   .  Hypertension Father   . Atrial fibrillation Father     HAD MURMUR  . Heart failure Mother   . Hypertension Brother   . Hypertension Sister   . Hypertension Son   . Congestive Heart Failure Mother   . Hypertension Mother   . CAD Sister     EARLY  . CAD Brother     EARLY  ROS- All systems are reviewed and negative except as per the HPI above  Physical Exam: Filed Vitals:   12/31/14 1138  BP: 130/84  Pulse: 118  Height: 5\' 8"  (1.727 m)  Weight: 257 lb (116.574 kg)    GEN- The patient is morbidly obese appearing, alert and oriented x 3 today.   Head- normocephalic, atraumatic Eyes-  Sclera clear, conjunctiva pink Ears- hearing intact Oropharynx- clear Neck- supple,  Lungs- Clear to ausculation bilaterally, normal work of breathing Heart- irregular rate and rhythm  GI- soft, NT, ND, + BS Extremities- no clubbing, cyanosis, + trace edema MS- no significant deformity or atrophy Skin- no rash or lesion Psych- euthymic mood, full affect Neuro- strength and sensation are intact  EKG today reveals afib with rvr 118 bpm, qrs int 108 ms, qtc int 557 ms, reviewed with Dr. Rayann Heman [facility] records reviewed     Assessment and Plan:  1.  Longstanding persistent afib Persistent afib despite increase to flecainide 100 mg bid  She is willing to be set up for cardioversion Long QT- avoid QT prolonging drugs No missed doses of xarelto Procedure labs today  2. Hypertensive cardiovascular disease  Bp normal today. Avoid salt. Diuretics to control fluid as prescribed, but cautioned not to overuse, especially metolazone.  3. Obesity Body mass index is 39.09 kg/(m^2). Weight loss is strongly encouraged  4. Diastolic dysfunction Appears stable.  5. Snoring history Declines sleep study   F/u in one week after cardioversion  Butch Penny C. Daphyne Miguez, [facility] [address] [place], [place] [address] [phone]

## 2015-01-07 NOTE — Discharge Instructions (Signed)
Electrical Cardioversion, Care After °Refer to this sheet in the next few weeks. These instructions provide you with information on caring for yourself after your procedure. Your health care provider may also give you more specific instructions. Your treatment has been planned according to current medical practices, but problems sometimes occur. Call your health care provider if you have any problems or questions after your procedure. °WHAT TO EXPECT AFTER THE PROCEDURE °After your procedure, it is typical to have the following sensations: °· Some redness on the skin where the shocks were delivered. If this is tender, a sunburn lotion or hydrocortisone cream may help. °· Possible return of an abnormal heart rhythm within hours or days after the procedure. °HOME CARE INSTRUCTIONS °· Take medicines only as directed by your health care provider. Be sure you understand how and when to take your medicine. °· Learn how to feel your pulse and check it often. °· Limit your activity for 48 hours after the procedure or as directed by your health care provider. °· Avoid or minimize caffeine and other stimulants as directed by your health care provider. °SEEK MEDICAL CARE IF: °· You feel like your heart is beating too fast or your pulse is not regular. °· You have any questions about your medicines. °· You have bleeding that will not stop. °SEEK IMMEDIATE MEDICAL CARE IF: °· You are dizzy or feel faint. °· It is hard to breathe or you feel short of breath. °· There is a change in discomfort in your chest. °· Your speech is slurred or you have trouble moving an arm or leg on one side of your body. °· You get a serious muscle cramp that does not go away. °· Your fingers or toes turn cold or blue. °  °This information is not intended to replace advice given to you by your health care provider. Make sure you discuss any questions you have with your health care provider. °  °Document Released: 12/28/2012 Document Revised: 03/30/2014  Document Reviewed: 12/28/2012 °Elsevier Interactive Patient Education ©2016 [employer] ° °

## 2015-01-07 NOTE — Anesthesia Preprocedure Evaluation (Signed)
Anesthesia Evaluation  Patient identified by MRN, date of birth, ID band Patient awake    Reviewed: Allergy & Precautions, NPO status , Patient's Chart, lab work & pertinent test results, reviewed documented beta blocker date and time   Airway Mallampati: II  TM Distance: >3 FB     Dental   Pulmonary shortness of breath,     + decreased breath sounds      Cardiovascular hypertension, +CHF  + dysrhythmias Atrial Fibrillation  Rhythm:Irregular Rate:Abnormal     Neuro/Psych Anxiety    GI/Hepatic GERD  ,  Endo/Other  diabetes, Type 2, Insulin Dependent, Oral Hypoglycemic Agents  Renal/GU      Musculoskeletal  (+) Arthritis ,   Abdominal   Peds  Hematology   Anesthesia Other Findings   Reproductive/Obstetrics                             Anesthesia Physical Anesthesia Plan  ASA: III  Anesthesia Plan: General   Post-op Pain Management:    Induction: Intravenous  Airway Management Planned: Mask  Additional Equipment:   Intra-op Plan:   Post-operative Plan:   Informed Consent: I have reviewed the patients History and Physical, chart, labs and discussed the procedure including the risks, benefits and alternatives for the proposed anesthesia with the patient or authorized representative who has indicated his/her understanding and acceptance.     Plan Discussed with: CRNA, Anesthesiologist and Surgeon  Anesthesia Plan Comments:         Anesthesia Quick Evaluation

## 2015-01-07 NOTE — Interval H&P Note (Signed)
History and Physical Interval Note:  01/07/2015 10:00 AM  Kristine [name]  has presented today for surgery, with the diagnosis of AFIB  The various methods of treatment have been discussed with the patient and family. After consideration of risks, benefits and other options for treatment, the patient has consented to  Procedure(s): CARDIOVERSION (N/A) as a surgical intervention .  The patient's history has been reviewed, patient examined, no change in status, stable for surgery.  I have reviewed the patient's chart and labs.  Questions were answered to the patient's satisfaction.     Sharol Harness, MD 01/07/2015 10:01 AM

## 2015-01-08 ENCOUNTER — Encounter (HOSPITAL_COMMUNITY): Payer: Self-pay | Admitting: Cardiovascular Disease

## 2015-01-10 ENCOUNTER — Other Ambulatory Visit: Payer: Self-pay | Admitting: Internal Medicine

## 2015-01-14 ENCOUNTER — Encounter (HOSPITAL_COMMUNITY): Payer: Self-pay | Admitting: Nurse Practitioner

## 2015-01-14 ENCOUNTER — Ambulatory Visit (HOSPITAL_COMMUNITY)
Admission: RE | Admit: 2015-01-14 | Discharge: 2015-01-14 | Disposition: A | Discharge status: Home / Self Care | Payer: Medicare Other | Source: Ambulatory Visit | Attending: Nurse Practitioner | Admitting: Nurse Practitioner

## 2015-01-14 VITALS — BP 128/74 | HR 93 | Ht 68.0 in | Wt 254.6 lb

## 2015-01-14 DIAGNOSIS — E559 Vitamin D deficiency, unspecified: Secondary | ICD-10-CM | POA: Insufficient documentation

## 2015-01-14 DIAGNOSIS — Z8249 Family history of ischemic heart disease and other diseases of the circulatory system: Secondary | ICD-10-CM | POA: Insufficient documentation

## 2015-01-14 DIAGNOSIS — Z794 Long term (current) use of insulin: Secondary | ICD-10-CM | POA: Diagnosis not present

## 2015-01-14 DIAGNOSIS — I10 Essential (primary) hypertension: Secondary | ICD-10-CM | POA: Diagnosis not present

## 2015-01-14 DIAGNOSIS — Z880 Allergy status to penicillin: Secondary | ICD-10-CM | POA: Diagnosis not present

## 2015-01-14 DIAGNOSIS — Z823 Family history of stroke: Secondary | ICD-10-CM | POA: Diagnosis not present

## 2015-01-14 DIAGNOSIS — Z7984 Long term (current) use of oral hypoglycemic drugs: Secondary | ICD-10-CM | POA: Insufficient documentation

## 2015-01-14 DIAGNOSIS — Z91041 Radiographic dye allergy status: Secondary | ICD-10-CM | POA: Insufficient documentation

## 2015-01-14 DIAGNOSIS — E7800 Pure hypercholesterolemia, unspecified: Secondary | ICD-10-CM | POA: Diagnosis not present

## 2015-01-14 DIAGNOSIS — Z79899 Other long term (current) drug therapy: Secondary | ICD-10-CM | POA: Insufficient documentation

## 2015-01-14 DIAGNOSIS — E119 Type 2 diabetes mellitus without complications: Secondary | ICD-10-CM | POA: Insufficient documentation

## 2015-01-14 DIAGNOSIS — I4819 Other persistent atrial fibrillation: Secondary | ICD-10-CM

## 2015-01-14 DIAGNOSIS — I481 Persistent atrial fibrillation: Secondary | ICD-10-CM

## 2015-01-14 DIAGNOSIS — Z7902 Long term (current) use of antithrombotics/antiplatelets: Secondary | ICD-10-CM | POA: Diagnosis not present

## 2015-01-14 DIAGNOSIS — K219 Gastro-esophageal reflux disease without esophagitis: Secondary | ICD-10-CM | POA: Diagnosis not present

## 2015-01-14 DIAGNOSIS — I4891 Unspecified atrial fibrillation: Secondary | ICD-10-CM | POA: Diagnosis present

## 2015-01-14 NOTE — Patient Instructions (Signed)
Your physician has recommended you make the following change in your medication:  1)stop flecainide  Scheduler will call you regarding appointment with Dr. Rayann Heman

## 2015-01-14 NOTE — Progress Notes (Signed)
Patient ID: Kristine [name], female   DOB: September 10, 1945, 69 y.o.   MRN: [id number]     Primary Care Physician: Kristine Ringer, MD Referring Physician:  Dr. Redge Gainer Kristine [name] is a 69 y.o. female with a h/o persistent afib that has failed tikosyn in the past due to long QTc and is here to f/u cardioversion. Unfortunately, did not hold and pt thinks she may have gone back into afib yesterday because she noted some more fatigue and dyspnea with activities. Flecainide will now be stopped  being considered a failure since she had been in afib on flecainide for weeks prior to cardioversion and now has had ERAF. We discussed options going forward, amiodarone, which may be an issue with h/o prolonged qt or ablation.   Today, she denies symptoms of palpitations, chest pain, shortness of breath, orthopnea, PND, lower extremity edema, dizziness, presyncope, syncope, or neurologic sequela. The patient is tolerating medications without difficulties and is otherwise without complaint today.   Past Medical History  Diagnosis Date  . Persistent atrial fibrillation ([place])   . Morbid obesity ([place])   . Pneumonia 2011  . Diabetes mellitus   . HTN (hypertension)   . Abnormal cardiovascular function study     MYOVIEW SHOWING ANTERIOR WALL ATTENUATION  . High cholesterol   . Scoliosis   . Spinal stenosis   . Anxiety   . Osteoarthritis   . Osteoporosis   . Mild aortic sclerosis ([place])   . Degenerative disc disease   . Vitamin D deficiency   . CHF (congestive heart failure) ([place])   . Pneumonia   . GERD (gastroesophageal reflux disease)   . Lichen planus   . Anasarca    Past Surgical History  Procedure Laterality Date  . Finger fracture surgery    . Cardiac catheterization  11/16/09    SMOOTH AND NORMAL  . Total abdominal hysterectomy    . Laparoscopic cholecystectomy    . Bladder suspension    . Colonoscopy  08/14/08  . Cardioversion N/A 01/07/2015    Procedure: CARDIOVERSION;  Surgeon:  Skeet Latch, MD;  Location: [facility];  Service: Cardiovascular;  Laterality: N/A;    Current Outpatient Prescriptions  Medication Sig Dispense Refill  . ALPRAZolam (XANAX) 0.25 MG tablet Take 0.125-0.25 mg by mouth 2 (two) times daily as needed for anxiety.   0  . BD PEN NEEDLE NANO U/F 32G X 4 MM MISC Inject 1 each as directed See admin instructions. Use pen needles with insulin pens daily  11  . esomeprazole (NEXIUM) 40 MG capsule Take 40 mg by mouth daily as needed (for acid reflux).     . furosemide (LASIX) 80 MG tablet Take 80 mg by mouth 2 (two) times daily.     Marland Kitchen HUMALOG KWIKPEN 200 UNIT/ML SOPN Inject 6 Units into the skin 3 (three) times daily with meals.  6  . KLOR-CON 10 10 MEQ tablet Take 20 mEq by mouth daily.  11  . LANTUS SOLOSTAR 100 UNIT/ML Solostar Pen Inject 50 Units as directed 2 (two) times daily.  5  . losartan (COZAAR) 100 MG tablet TAKE 1 TABLET BY MOUTH EVERY DAY (Patient taking differently: TAKE 100 MG BY MOUTH EVERY DAY) 90 tablet 1  . metFORMIN (GLUCOPHAGE) 500 MG tablet Take 500-1,000 mg by mouth 2 (two) times daily with a meal. 1000 mg in the AM and 500mg  at night    . metolazone (ZAROXOLYN) 2.5 MG tablet Take 2.5 mg by mouth daily as  needed (for shortness of breath).     . metoprolol succinate (TOPROL-XL) 50 MG 24 hr tablet Take 1 tablet in the morning and 1/2 tablet in the evening. (Patient taking differently: Take 25-50 mg by mouth 2 (two) times daily. Take 50 mg by mouth in the morning and take 25 mg by mouth in the evening) 45 tablet 1  . rivaroxaban (XARELTO) 20 MG TABS tablet Take 20 mg by mouth every morning.    . temazepam (RESTORIL) 15 MG capsule Take 15 mg by mouth at bedtime as needed for sleep.    . Vitamin D, Ergocalciferol, (DRISDOL) 50000 UNITS CAPS Take 50,000 Units by mouth every 7 (seven) days. Takes on Thursday    . XOPENEX HFA 45 MCG/ACT inhaler Take 1 puff by mouth 4 (four) times daily as needed for wheezing.   6   No current  facility-administered medications for this encounter.    Allergies  Allergen Reactions  . Albuterol Other (See Comments)    REACTION: Tachycardia- AFib  . Epinephrine Other (See Comments)    Increases heart rate per patient.   . Penicillins Other (See Comments)    REACTION: flushing \\T \ hot  . Bee Venom Other (See Comments)    "makes me nervous"  . Ivp Dye [Iodinated Diagnostic Agents] Other (See Comments)    flushing    Social History   Social History  . Marital Status: Married    Spouse Name: N/A  . Number of Children: N/A  . Years of Education: N/A   Occupational History  . Not on file.   Social History Main Topics  . Smoking status: Never Smoker   . Smokeless tobacco: Never Used  . Alcohol Use: No  . Drug Use: No  . Sexual Activity: Not on file   Other Topics Concern  . Not on file   Social History Narrative   Pt lives in [place] with spouse.   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    Family History  Problem Relation Age of Onset  . Stroke Father   . Hypertension Father   . Atrial fibrillation Father     HAD MURMUR  . Heart failure Mother   . Hypertension Brother   . Hypertension Sister   . Hypertension Son   . Congestive Heart Failure Mother   . Hypertension Mother   . CAD Sister     EARLY  . CAD Brother     EARLY    ROS- All systems are reviewed and negative except as per the HPI above  Physical Exam: Filed Vitals:   01/14/15 1138  BP: 128/74  Pulse: 93  Height: 5\' 8"  (1.727 m)  Weight: 254 lb 9.6 oz (115.486 kg)    GEN- The patient is well appearing, alert and oriented x 3 today.   Head- normocephalic, atraumatic Eyes-  Sclera clear, conjunctiva pink Ears- hearing intact Oropharynx- clear Neck- supple, no JVP Lymph- no cervical lymphadenopathy Lungs- Clear to ausculation bilaterally, normal work of breathing Heart- Irregular rate  and rhythm, no murmurs, rubs or gallops, PMI not laterally displaced GI- soft, NT, ND, + BS Extremities- no clubbing, cyanosis, or edema MS- no significant deformity or atrophy Skin- no rash or lesion Psych- euthymic mood, full affect Neuro- strength and sensation are intact  EKG- afib v rate 93 bpm, qrs int 112 ms, qtc 467 ms  Assessment and Plan:  1.Persistent afib, failed cardioversion, flecainide/tiksoyn Stop flecainide Repeat EKG on Thursday May need to increase rate control Avoid qt prolonging drugs  F/u appointment with Dr. Rayann Heman in 4-6 weeks to discuss further options to achieve SR  Donna C. Carroll, [facility] [address] [place], [place] [address] [phone]

## 2015-01-17 ENCOUNTER — Other Ambulatory Visit: Payer: Self-pay

## 2015-01-17 ENCOUNTER — Ambulatory Visit (HOSPITAL_COMMUNITY)
Admission: RE | Admit: 2015-01-17 | Discharge: 2015-01-17 | Disposition: A | Discharge status: Home / Self Care | Payer: Medicare Other | Source: Ambulatory Visit | Attending: Nurse Practitioner | Admitting: Nurse Practitioner

## 2015-01-17 VITALS — BP 142/90 | HR 113 | Ht 68.0 in | Wt 253.0 lb

## 2015-01-17 DIAGNOSIS — I481 Persistent atrial fibrillation: Secondary | ICD-10-CM | POA: Insufficient documentation

## 2015-01-17 DIAGNOSIS — I4819 Other persistent atrial fibrillation: Secondary | ICD-10-CM

## 2015-01-17 MED ORDER — METOPROLOL SUCCINATE ER 50 MG PO TB24: 50.0000 mg | ORAL_TABLET | Freq: Two times a day (BID) | ORAL | Status: DC

## 2015-01-17 NOTE — Patient Instructions (Signed)
Your physician has recommended you make the following change in your medication:  1)Increase metoprolol to 50mg  twice daily   Call back if heart rate continues to be elevated >100.

## 2015-01-18 ENCOUNTER — Encounter (HOSPITAL_COMMUNITY): Payer: Self-pay | Admitting: Nurse Practitioner

## 2015-01-18 NOTE — Progress Notes (Signed)
Patient ID: Kristine [name], female   DOB: 02-13-1946, 69 y.o.   MRN: [id number]     Primary Care Physician: Kristine Ringer, MD Referring Physician:  Dr. Redge [name] Kristine [name] is a 69 y.o. female with a h/o persistent afib that has failed tikosyn in the past due to long QTc and is here to f/u cardioversion. Unfortunately, did not hold and pt thinks she may have gone back into afib yesterday because she noted some more fatigue and dyspnea with activities. Flecainide will now be stopped  being considered a failure since she had been in afib on flecainide for weeks prior to cardioversion and now has had ERAF. We discussed options going forward, amiodarone, which may be an issue with h/o prolonged qt or ablation.   Off flecainide, pt presented to afib clinic today, intially with HR of 130 in afib, but she  was having a hypoglemic reaction and after eating and drinking regular COKE, her heart rate returned to 113 and she was feeling much better. Will rate control until f/u with Kristine [name] 11/17, where other options will be discussed, ablation vrs amiodarone, with LA size 49 mm.   Today, she denies symptoms of palpitations, chest pain, shortness of breath, orthopnea, PND, lower extremity edema, dizziness, presyncope, syncope, or neurologic sequela. The patient is tolerating medications without difficulties and is otherwise without complaint today.   Past Medical History  Diagnosis Date  . Persistent atrial fibrillation ([place])   . Morbid obesity ([place])   . Pneumonia 2011  . Diabetes mellitus   . HTN (hypertension)   . Abnormal cardiovascular function study     MYOVIEW SHOWING ANTERIOR WALL ATTENUATION  . High cholesterol   . Scoliosis   . Spinal stenosis   . Anxiety   . Osteoarthritis   . Osteoporosis   . Mild aortic sclerosis ([place])   . Degenerative disc disease   . Vitamin D deficiency   . CHF (congestive heart failure) ([place])   . Pneumonia   . GERD (gastroesophageal reflux disease)    . Lichen planus   . Anasarca    Past Surgical History  Procedure Laterality Date  . Finger fracture surgery    . Cardiac catheterization  11/16/09    SMOOTH AND NORMAL  . Total abdominal hysterectomy    . Laparoscopic cholecystectomy    . Bladder suspension    . Colonoscopy  08/14/08  . Cardioversion N/A 01/07/2015    Procedure: CARDIOVERSION;  Surgeon: Skeet Latch, MD;  Location: [facility];  Service: Cardiovascular;  Laterality: N/A;    Current Outpatient Prescriptions  Medication Sig Dispense Refill  . ALPRAZolam (XANAX) 0.25 MG tablet Take 0.125-0.25 mg by mouth 2 (two) times daily as needed for anxiety.   0  . BD PEN NEEDLE NANO U/F 32G X 4 MM MISC Inject 1 each as directed See admin instructions. Use pen needles with insulin pens daily  11  . esomeprazole (NEXIUM) 40 MG capsule Take 40 mg by mouth daily as needed (for acid reflux).     . furosemide (LASIX) 80 MG tablet Take 80 mg by mouth 2 (two) times daily.     Marland Kitchen HUMALOG KWIKPEN 200 UNIT/ML SOPN Inject 6 Units into the skin 3 (three) times daily with meals.  6  . KLOR-CON 10 10 MEQ tablet Take 20 mEq by mouth daily.  11  . LANTUS SOLOSTAR 100 UNIT/ML Solostar Pen Inject 50 Units as directed 2 (two) times daily.  5  . losartan (COZAAR)  100 MG tablet TAKE 1 TABLET BY MOUTH EVERY DAY (Patient taking differently: TAKE 100 MG BY MOUTH EVERY DAY) 90 tablet 1  . metFORMIN (GLUCOPHAGE) 500 MG tablet Take 500-1,000 mg by mouth 2 (two) times daily with a meal. 1000 mg in the AM and 500mg  at night    . metolazone (ZAROXOLYN) 2.5 MG tablet Take 2.5 mg by mouth daily as needed (for shortness of breath).     . metoprolol succinate (TOPROL-XL) 50 MG 24 hr tablet Take 1 tablet (50 mg total) by mouth 2 (two) times daily. 45 tablet 1  . rivaroxaban (XARELTO) 20 MG TABS tablet Take 20 mg by mouth every morning.    . temazepam (RESTORIL) 15 MG capsule Take 15 mg by mouth at bedtime as needed for sleep.    . Vitamin D, Ergocalciferol,  (DRISDOL) 50000 UNITS CAPS Take 50,000 Units by mouth every 7 (seven) days. Takes on Thursday    . XOPENEX HFA 45 MCG/ACT inhaler Take 1 puff by mouth 4 (four) times daily as needed for wheezing.   6   No current facility-administered medications for this encounter.    Allergies  Allergen Reactions  . Albuterol Other (See Comments)    REACTION: Tachycardia- AFib  . Epinephrine Other (See Comments)    Increases heart rate per patient.   . Penicillins Other (See Comments)    REACTION: flushing \\T \ hot  . Bee Venom Other (See Comments)    "makes me nervous"  . Ivp Dye [Iodinated Diagnostic Agents] Other (See Comments)    flushing    Social History   Social History  . Marital Status: Married    Spouse Name: N/A  . Number of Children: N/A  . Years of Education: N/A   Occupational History  . Not on file.   Social History Main Topics  . Smoking status: Never Smoker   . Smokeless tobacco: Never Used  . Alcohol Use: No  . Drug Use: No  . Sexual Activity: Not on file   Other Topics Concern  . Not on file   Social History Narrative   Pt lives in [place] with spouse.   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    Family History  Problem Relation Age of Onset  . Stroke Father   . Hypertension Father   . Atrial fibrillation Father     HAD MURMUR  . Heart failure Mother   . Hypertension Brother   . Hypertension Sister   . Hypertension Son   . Congestive Heart Failure Mother   . Hypertension Mother   . CAD Sister     EARLY  . CAD Brother     EARLY    ROS- All systems are reviewed and negative except as per the HPI above  Physical Exam: Filed Vitals:   01/17/15 0931  BP: 142/90  Pulse: 113  Height: 5\' 8"  (1.727 m)  Weight: 253 lb (114.76 kg)    GEN- The patient is well appearing, alert and oriented x 3 today.   Head- normocephalic, atraumatic Eyes-  Sclera  clear, conjunctiva pink Ears- hearing intact Oropharynx- clear Neck- supple, no JVP Lymph- no cervical lymphadenopathy Lungs- Clear to ausculation bilaterally, normal work of breathing Heart- Irregular rate and rhythm, no murmurs, rubs or gallops, PMI not laterally displaced GI- soft, NT, ND, + BS Extremities- no clubbing, cyanosis, or edema MS- no significant deformity  or atrophy Skin- no rash or lesion Psych- euthymic mood, full affect Neuro- strength and sensation are intact  EKG- afib v rate 113 bpm, qrs int 471 ms, qtc 471 ms. Echo- Normal LV size with mild LV hypertrophy. EF 50% with diffuse hypokinesis. Moderate diastolic dysfunction. Normal RV size with mildly decreased systolic function. No significant valvular abnormalities. Left atrium 49 mm  Assessment and Plan: 1.Persistent  symptomatic afib, failed cardioversion, flecainide/tiksoyn Now off  flecainide Will increase rate control, will start with full tab of bb at bedtime, if HR still not less than 100, and BP ok, will add cardizem 120 mg qd Avoid qt prolonging drugs  F/u appointment with Kristine [name] mid November to discuss options going forward.  Geroge Baseman Ciana Simmon, [facility] [address] [place], [place] [address] [phone]

## 2015-01-21 ENCOUNTER — Encounter: Payer: Self-pay | Admitting: Internal Medicine

## 2015-02-06 ENCOUNTER — Encounter: Payer: Self-pay | Admitting: Internal Medicine

## 2015-02-06 ENCOUNTER — Ambulatory Visit (INDEPENDENT_AMBULATORY_CARE_PROVIDER_SITE_OTHER): Payer: Medicare Other | Admitting: Internal Medicine

## 2015-02-06 VITALS — BP 153/100 | HR 137 | Ht 69.0 in | Wt 252.8 lb

## 2015-02-06 DIAGNOSIS — I5032 Chronic diastolic (congestive) heart failure: Secondary | ICD-10-CM | POA: Diagnosis not present

## 2015-02-06 DIAGNOSIS — I119 Hypertensive heart disease without heart failure: Secondary | ICD-10-CM

## 2015-02-06 DIAGNOSIS — I4819 Other persistent atrial fibrillation: Secondary | ICD-10-CM

## 2015-02-06 DIAGNOSIS — I481 Persistent atrial fibrillation: Secondary | ICD-10-CM

## 2015-02-06 MED ORDER — METOPROLOL SUCCINATE ER 50 MG PO TB24: ORAL_TABLET | ORAL | Status: DC

## 2015-02-06 MED ORDER — AMIODARONE HCL 200 MG PO TABS: 200.0000 mg | ORAL_TABLET | Freq: Two times a day (BID) | ORAL | Status: DC

## 2015-02-06 NOTE — Patient Instructions (Signed)
Medication Instructions:  Your physician has recommended you make the following change in your medication:  1) Increase Toprol to 100mg  in the am and 50 mg in the pm 2) Start Amiodarone to 200mg  twice daily   Labwork: Your physician recommends that you return for lab work in LFT's/TSH/T4   Testing/Procedures: None ordered   Follow-Up: Your physician recommends that you schedule a follow-up appointment in: 4 weeks with Kristine Palau, NP and 8 weeks with Kristine [name]   Any Other Special Instructions Will Be Listed Below (If Applicable).     If you need a refill on your cardiac medications before your next appointment, please call your pharmacy.

## 2015-02-07 LAB — HEPATIC FUNCTION PANEL
ALT: 24 U/L (ref 6–29)
AST: 23 U/L (ref 10–35)
Albumin: 4 g/dL (ref 3.6–5.1)
Alkaline Phosphatase: 95 U/L (ref 33–130)
BILIRUBIN DIRECT: 0.1 mg/dL (ref <=0.2)
BILIRUBIN INDIRECT: 0.5 mg/dL (ref 0.2–1.2)
TOTAL PROTEIN: 6.6 g/dL (ref 6.1–8.1)
Total Bilirubin: 0.6 mg/dL (ref 0.2–1.2)

## 2015-02-07 LAB — T4, FREE: FREE T4: 1.23 ng/dL (ref 0.80–1.80)

## 2015-02-07 LAB — TSH: TSH: 2.495 u[IU]/mL (ref 0.350–4.500)

## 2015-02-08 ENCOUNTER — Telehealth: Payer: Self-pay | Admitting: Cardiovascular Disease

## 2015-02-08 NOTE — Progress Notes (Signed)
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 female with a h/o obesity, DM, HTN, and persistent afib who presents for EP follow-up.  She continues to struggle with persistent afib and tachycardia.  She has fatigue and very poor exercise tolerance.  She also has postural dizziness.  She has been in afib for about 3 years but more recently has had RVR.  She has failed medical therapy with flecainide and tikosyn.  Today, she denies symptoms of chest pain, orthopnea, PND, lower extremity edema, presyncope, syncope, or neurologic sequela. The patient is tolerating medications without difficulties and is otherwise without complaint today.   Past Medical History  Diagnosis Date  . Persistent atrial fibrillation ([place])   . Morbid obesity ([place])   . Pneumonia 2011  . Diabetes mellitus   . HTN (hypertension)   . Abnormal cardiovascular function study     MYOVIEW SHOWING ANTERIOR WALL ATTENUATION  . High cholesterol   . Scoliosis   . Spinal stenosis   . Anxiety   . Osteoarthritis   . Osteoporosis   . Mild aortic sclerosis ([place])   . Degenerative disc disease   . Vitamin D deficiency   . CHF (congestive heart failure) ([place])   . Pneumonia   . GERD (gastroesophageal reflux disease)   . Lichen planus   . Anasarca    Past Surgical History  Procedure Laterality Date  . Finger fracture surgery    . Cardiac catheterization  11/16/09    SMOOTH AND NORMAL  . Total abdominal hysterectomy    . Laparoscopic cholecystectomy    . Bladder suspension    . Colonoscopy  08/14/08  . Cardioversion N/A 01/07/2015    Procedure: CARDIOVERSION;  Surgeon: Skeet Latch, MD;  Location: [facility];  Service: Cardiovascular;  Laterality: N/A;    Current Outpatient Prescriptions  Medication Sig Dispense Refill  . ALPRAZolam (XANAX) 0.25 MG tablet Take 0.125-0.25 mg by mouth 2 (two) times daily as needed for anxiety.   0  . BD PEN NEEDLE NANO  U/F 32G X 4 MM MISC Inject 1 each as directed See admin instructions. Use pen needles with insulin pens daily  11  . esomeprazole (NEXIUM) 40 MG capsule Take 40 mg by mouth daily as needed (for acid reflux).     . furosemide (LASIX) 80 MG tablet Take 80 mg by mouth 2 (two) times daily.     Marland Kitchen HUMALOG KWIKPEN 200 UNIT/ML SOPN Inject 6 Units into the skin 3 (three) times daily with meals.  6  . KLOR-CON 10 10 MEQ tablet Take 20 mEq by mouth daily.  11  . LANTUS SOLOSTAR 100 UNIT/ML Solostar Pen Inject 50 Units as directed 2 (two) times daily.  5  . losartan (COZAAR) 100 MG tablet TAKE 1 TABLET BY MOUTH EVERY DAY (Patient taking differently: TAKE 100 MG BY MOUTH EVERY DAY) 90 tablet 1  . metFORMIN (GLUCOPHAGE) 500 MG tablet Take 500-1,000 mg by mouth 2 (two) times daily with a meal. 1000 mg in the AM and 500mg  at night    . metolazone (ZAROXOLYN) 2.5 MG tablet Take 2.5 mg by mouth daily as needed (for shortness of breath).     . metoprolol succinate (TOPROL-XL) 50 MG 24 hr tablet Take 2 tablets by mouth in the morning and 1 tablet by mouth in the pm 270 tablet 3  . rivaroxaban (XARELTO) 20 MG TABS tablet Take 20 mg by mouth every morning.    Marland Kitchen  temazepam (RESTORIL) 15 MG capsule Take 15 mg by mouth at bedtime as needed for sleep.    . Vitamin D, Ergocalciferol, (DRISDOL) 50000 UNITS CAPS Take 50,000 Units by mouth every 7 (seven) days. Takes on Thursday    . XOPENEX HFA 45 MCG/ACT inhaler Take 1 puff by mouth 4 (four) times daily as needed for wheezing.   6  . amiodarone (PACERONE) 200 MG tablet Take 1 tablet (200 mg total) by mouth 2 (two) times daily. 180 tablet 3   No current facility-administered medications for this visit.    Allergies  Allergen Reactions  . Albuterol Other (See Comments)    REACTION: Tachycardia- AFib  . Epinephrine Other (See Comments)    Increases heart rate per patient.   . Penicillins Other (See Comments)    REACTION: flushing \\T \ hot  . Bee Venom Other (See Comments)     "makes me nervous"  . Ivp Dye [Iodinated Diagnostic Agents] Other (See Comments)    flushing    Social History   Social History  . Marital Status: Married    Spouse Name: N/A  . Number of Children: N/A  . Years of Education: N/A   Occupational History  . Not on file.   Social History Main Topics  . Smoking status: Never Smoker   . Smokeless tobacco: Never Used  . Alcohol Use: No  . Drug Use: No  . Sexual Activity: Not on file   Other Topics Concern  . Not on file   Social History Narrative   Pt lives in [place] with spouse.   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    Family History  Problem Relation Age of Onset  . Stroke Father   . Hypertension Father   . Atrial fibrillation Father     HAD MURMUR  . Heart failure Mother   . Hypertension Brother   . Hypertension Sister   . Hypertension Son   . Congestive Heart Failure Mother   . Hypertension Mother   . CAD Sister     EARLY  . CAD Brother     EARLY    ROS- All systems are reviewed and negative except as per the HPI above  Physical Exam: Filed Vitals:   02/06/15 1625  BP: 153/100  Pulse: 137  Height: 5\' 9"  (1.753 m)  Weight: 252 lb 12.8 oz (114.669 kg)  SpO2: 97%    GEN- The patient is morbidly obese appearing, alert and oriented x 3 today.   Head- normocephalic, atraumatic Eyes-  Sclera clear, conjunctiva pink Ears- hearing intact Oropharynx- clear Neck- supple,  Lungs- Clear to ausculation bilaterally, normal work of breathing Heart- irregular rate and rhythm  GI- soft, NT, ND, + BS Extremities- no clubbing, cyanosis, + edema MS- no significant deformity or atrophy Skin- no rash or lesion Psych- euthymic mood, full affect Neuro- strength and sensation are intact  EKG today reveals afib with V rates 130s  Epic records are reviewed.      Assessment and Plan:  1.  Longstanding  persistent afib Very poorly controlled V rates Increase toprol to 100mg  qam and 50mg  qpm Add amiodarone 200mg  BID.  RIsks of amiodarone were discussed in detail with patient and two daughters today.  The understand risks and wish to proceed. Lfts/tfts today Follow-up with Butch Penny in the AF clnic in 4 weeks.  If still in AF will need  cardioversion arranged at that time. Needs echo once in sinus  2. Hypertensive cardiovascular disease 2 gram sodium diet   3. Obesity Body mass index is 37.31 kg/(m^2). Weight loss is strongly encouraged  4. Diastolic dysfunction Appears dry today I have instructed her to reduce lasix to 80mg  daily bmet  Follow-up with Butch Penny in 4 weeks I will see again in 8 week  Thompson Grayer MD, [facility] 02/08/2015 11:48 AM

## 2015-02-08 NOTE — Telephone Encounter (Signed)
Kristine [name] started having chest tightness this evening.  She checked her blood pressure via an automatic cuff and it read 148/100.  She took all her evening medications, including metoprolol and xanax.  Her daughter repeated it with a manual cuff and her BP was 121/87.  She is no longer having discomfort.  She was afraid that she may be having an adverse reaction to the newly initiated amiodarone  She was reassured and will continue taking her medications as prescribed.  If her BP continues to run high she will follow up with Dr. Rayann Heman.  [name] C. Oval Linsey, MD 02/08/2015 10:25 PM

## 2015-02-19 ENCOUNTER — Encounter (HOSPITAL_COMMUNITY): Payer: Self-pay | Admitting: Nurse Practitioner

## 2015-02-22 ENCOUNTER — Ambulatory Visit: Payer: Medicare Other | Admitting: Internal Medicine

## 2015-02-26 ENCOUNTER — Other Ambulatory Visit (HOSPITAL_COMMUNITY): Payer: Self-pay | Admitting: Nurse Practitioner

## 2015-03-04 ENCOUNTER — Ambulatory Visit (HOSPITAL_COMMUNITY): Payer: Medicare Other | Admitting: Nurse Practitioner

## 2015-03-06 ENCOUNTER — Encounter (HOSPITAL_COMMUNITY): Payer: Self-pay | Admitting: Nurse Practitioner

## 2015-03-06 ENCOUNTER — Other Ambulatory Visit: Payer: Self-pay

## 2015-03-06 ENCOUNTER — Ambulatory Visit (HOSPITAL_COMMUNITY)
Admission: RE | Admit: 2015-03-06 | Discharge: 2015-03-06 | Disposition: A | Discharge status: Home / Self Care | Payer: Medicare Other | Source: Ambulatory Visit | Attending: Nurse Practitioner | Admitting: Nurse Practitioner

## 2015-03-06 VITALS — BP 118/78 | HR 87 | Ht 68.0 in | Wt 258.4 lb

## 2015-03-06 DIAGNOSIS — I4819 Other persistent atrial fibrillation: Secondary | ICD-10-CM

## 2015-03-06 DIAGNOSIS — I481 Persistent atrial fibrillation: Secondary | ICD-10-CM | POA: Insufficient documentation

## 2015-03-06 DIAGNOSIS — Z1231 Encounter for screening mammogram for malignant neoplasm of breast: Secondary | ICD-10-CM

## 2015-03-06 LAB — BASIC METABOLIC PANEL
ANION GAP: 13 (ref 5–15)
BUN: 30 mg/dL — ABNORMAL HIGH (ref 6–20)
CALCIUM: 9.3 mg/dL (ref 8.9–10.3)
CHLORIDE: 94 mmol/L — AB (ref 101–111)
CO2: 30 mmol/L (ref 22–32)
Creatinine, Ser: 1.66 mg/dL — ABNORMAL HIGH (ref 0.44–1.00)
GFR, EST AFRICAN AMERICAN: 36 mL/min — AB (ref >60)
GFR, EST NON AFRICAN AMERICAN: 31 mL/min — AB (ref >60)
GLUCOSE: 251 mg/dL — AB (ref 65–99)
POTASSIUM: 3.6 mmol/L (ref 3.5–5.1)
Sodium: 137 mmol/L (ref 135–145)

## 2015-03-06 LAB — CBC
HEMATOCRIT: 40.3 % (ref 36.0–46.0)
HEMOGLOBIN: 13.2 g/dL (ref 12.0–15.0)
MCH: 28.8 pg (ref 26.0–34.0)
MCHC: 32.8 g/dL (ref 30.0–36.0)
MCV: 88 fL (ref 78.0–100.0)
Platelets: 261 10*3/uL (ref 150–400)
RBC: 4.58 MIL/uL (ref 3.87–5.11)
RDW: 15.2 % (ref 11.5–15.5)
WBC: 10.2 10*3/uL (ref 4.0–10.5)

## 2015-03-06 NOTE — Patient Instructions (Signed)
Cardioversion scheduled for Monday, December 19th  - Arrive at the [facility] and go to admitting at [facility] not eat or drink anything after midnight the night prior to your procedure.  - Take all your medication with a sip of water prior to arrival.  - You will not be able to drive home after your procedure.  After cardioversion on Monday -- decrease Amiodarone to 200mg  once a day  Your physician wants you to follow-up in: 3 months with Dr. Acie Fredrickson. You will receive a reminder letter in the mail two months in advance. If you don't receive a letter, please call our office to schedule the follow-up appointment.

## 2015-03-06 NOTE — Progress Notes (Signed)
Patient ID: Kristine [name], female   DOB: 12-30-1945, 69 y.o.   MRN: [id number]     Primary Care Physician: Kristine Ringer, MD Referring Physician: Dr. Redge [name] JERAE [name] is a 69 y.o. female with a h/o persistent afib, failing flecainide and cardioversion in the past, that was recently seen by Kristine [name] and given options to restore sinus rhythm.  Treatment with amiodarone was decided on and she is here for f/u after loading on amiodarone  200 mg bid for the last month. She continues to be in afib with rate control but feels better on current treatment. She will be set up for cardioversion today.  Today, she denies symptoms of palpitations, chest pain, shortness of breath, orthopnea, PND, lower extremity edema, dizziness, presyncope, syncope, or neurologic sequela. The patient is tolerating medications without difficulties and is otherwise without complaint today.   Past Medical History  Diagnosis Date  . Persistent atrial fibrillation ([place])   . Morbid obesity ([place])   . Pneumonia 2011  . Diabetes mellitus   . HTN (hypertension)   . Abnormal cardiovascular function study     MYOVIEW SHOWING ANTERIOR WALL ATTENUATION  . High cholesterol   . Scoliosis   . Spinal stenosis   . Anxiety   . Osteoarthritis   . Osteoporosis   . Mild aortic sclerosis ([place])   . Degenerative disc disease   . Vitamin D deficiency   . CHF (congestive heart failure) ([place])   . Pneumonia   . GERD (gastroesophageal reflux disease)   . Lichen planus   . Anasarca    Past Surgical History  Procedure Laterality Date  . Finger fracture surgery    . Cardiac catheterization  11/16/09    SMOOTH AND NORMAL  . Total abdominal hysterectomy    . Laparoscopic cholecystectomy    . Bladder suspension    . Colonoscopy  08/14/08  . Cardioversion Kristine [name] 01/07/2015    Procedure: CARDIOVERSION;  Surgeon: Kristine Latch, MD;  Location: [facility];  Service: Cardiovascular;  Laterality: Kristine [name];    Current Outpatient  Prescriptions  Medication Sig Dispense Refill  . ALPRAZolam (XANAX) 0.25 MG tablet Take 0.125-0.25 mg by mouth 2 (two) times daily as needed for anxiety.   0  . amiodarone (PACERONE) 200 MG tablet Take 1 tablet (200 mg total) by mouth 2 (two) times daily. 180 tablet 3  . BD PEN NEEDLE NANO U/F 32G X 4 MM MISC Inject 1 each as directed See admin instructions. Use pen needles with insulin pens daily  11  . esomeprazole (NEXIUM) 40 MG capsule Take 40 mg by mouth daily as needed (for acid reflux).     . furosemide (LASIX) 80 MG tablet Take 80 mg by mouth 2 (two) times daily.     Marland Kitchen gabapentin (NEURONTIN) 100 MG capsule Take 100 mg by mouth at bedtime.    Marland Kitchen HUMALOG KWIKPEN 200 UNIT/ML SOPN Inject 6 Units into the skin 3 (three) times daily with meals.  6  . KLOR-CON 10 10 MEQ tablet Take 20 mEq by mouth daily.  11  . LANTUS SOLOSTAR 100 UNIT/ML Solostar Pen Inject 50 Units as directed 2 (two) times daily.  5  . losartan (COZAAR) 100 MG tablet TAKE 1 TABLET BY MOUTH EVERY DAY (Patient taking differently: TAKE 100 MG BY MOUTH EVERY DAY) 90 tablet 1  . metFORMIN (GLUCOPHAGE) 500 MG tablet Take 500-1,000 mg by mouth 2 (two) times daily with a meal. 1000 mg in the AM and 500mg   at night    . metolazone (ZAROXOLYN) 2.5 MG tablet Take 2.5 mg by mouth daily as needed (for shortness of breath).     . metoprolol succinate (TOPROL-XL) 50 MG 24 hr tablet Take 2 tablets by mouth in the morning and 1 tablet by mouth in the pm 270 tablet 3  . metoprolol succinate (TOPROL-XL) 50 MG 24 hr tablet Take 2 tablets (100mg ) in the AM and 1 tablet (50mg ) in the PM by mouth. 90 tablet 3  . rivaroxaban (XARELTO) 20 MG TABS tablet Take 20 mg by mouth every morning.    . temazepam (RESTORIL) 15 MG capsule Take 15 mg by mouth at bedtime as needed for sleep.    . Vitamin D, Ergocalciferol, (DRISDOL) 50000 UNITS CAPS Take 50,000 Units by mouth every 7 (seven) days. Takes on Thursday    . XOPENEX HFA 45 MCG/ACT inhaler Take 1 puff by  mouth 4 (four) times daily as needed for wheezing.   6   No current facility-administered medications for this encounter.    Allergies  Allergen Reactions  . Albuterol Other (See Comments)    REACTION: Tachycardia- AFib  . Epinephrine Other (See Comments)    Increases heart rate per patient.   . Penicillins Other (See Comments)    REACTION: flushing \\T \ hot  . Bee Venom Other (See Comments)    "makes me nervous"  . Ivp Dye [Iodinated Diagnostic Agents] Other (See Comments)    flushing    Social History   Social History  . Marital Status: Married    Spouse Name: Kristine [name]  . Number of Children: Kristine [name]  . Years of Education: Kristine [name]   Occupational History  . Not on file.   Social History Main Topics  . Smoking status: Never Smoker   . Smokeless tobacco: Never Used  . Alcohol Use: No  . Drug Use: No  . Sexual Activity: Not on file   Other Topics Concern  . Not on file   Social History Narrative   Pt lives in [place] with spouse.   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    Family History  Problem Relation Age of Onset  . Stroke Father   . Hypertension Father   . Atrial fibrillation Father     HAD MURMUR  . Heart failure Mother   . Hypertension Brother   . Hypertension Sister   . Hypertension Son   . Congestive Heart Failure Mother   . Hypertension Mother   . CAD Sister     EARLY  . CAD Brother     EARLY    ROS- All systems are reviewed and negative except as per the HPI above  Physical Exam: Filed Vitals:   03/06/15 1140  BP: 118/78  Pulse: 87  Height: 5\' 8"  (1.727 m)  Weight: 258 lb 6.4 oz (117.209 kg)    GEN- The patient is well appearing, alert and oriented x 3 today.   Head- normocephalic, atraumatic Eyes-  Sclera clear, conjunctiva pink Ears- hearing intact Oropharynx- clear Neck- supple, no JVP Lymph- no cervical lymphadenopathy Lungs- Clear to  ausculation bilaterally, normal work of breathing Heart- Regular rate and rhythm, no murmurs, rubs or gallops, PMI not laterally displaced GI- soft, NT, ND, + BS Extremities- no clubbing, cyanosis, or edema MS- no significant deformity or atrophy Skin- no rash or lesion Psych- euthymic mood, full affect Neuro- strength  and sensation are intact  EKG-afib with v rate at 87 bpm, rad, qrs int 92 ms, qtc 534 ms Epic records reviewed  Assessment and Plan: 1. Persistent symptomatic afib Continue amiodarone at 200 mg bid Scheduled for cardioversion 12/19 After cardioversion reduce amiodarone to 200 mg q day. States no missed doses of xarelto Bmet, cbc today  F/u in afib clinic one week after cardioversion 1/11 with Kristine [name]

## 2015-03-07 ENCOUNTER — Ambulatory Visit
Admission: RE | Admit: 2015-03-07 | Discharge: 2015-03-07 | Disposition: A | Discharge status: Home / Self Care | Payer: Medicare Other | Source: Ambulatory Visit

## 2015-03-07 DIAGNOSIS — Z1231 Encounter for screening mammogram for malignant neoplasm of breast: Secondary | ICD-10-CM

## 2015-03-11 ENCOUNTER — Ambulatory Visit (HOSPITAL_COMMUNITY)
Admission: RE | Admit: 2015-03-11 | Discharge: 2015-03-11 | Disposition: A | Discharge status: Home / Self Care | Payer: Medicare Other | Source: Ambulatory Visit | Attending: Cardiovascular Disease | Admitting: Cardiovascular Disease

## 2015-03-11 ENCOUNTER — Ambulatory Visit (HOSPITAL_COMMUNITY): Payer: Medicare Other | Admitting: Anesthesiology

## 2015-03-11 ENCOUNTER — Ambulatory Visit: Payer: Medicare Other | Admitting: Cardiovascular Disease

## 2015-03-11 ENCOUNTER — Encounter (HOSPITAL_COMMUNITY): Payer: Self-pay

## 2015-03-11 ENCOUNTER — Encounter (HOSPITAL_COMMUNITY)
Admission: RE | Disposition: A | Discharge status: Home / Self Care | Payer: Self-pay | Source: Ambulatory Visit | Attending: Cardiovascular Disease

## 2015-03-11 DIAGNOSIS — Z7901 Long term (current) use of anticoagulants: Secondary | ICD-10-CM | POA: Insufficient documentation

## 2015-03-11 DIAGNOSIS — I481 Persistent atrial fibrillation: Secondary | ICD-10-CM | POA: Insufficient documentation

## 2015-03-11 DIAGNOSIS — Z79899 Other long term (current) drug therapy: Secondary | ICD-10-CM | POA: Diagnosis not present

## 2015-03-11 DIAGNOSIS — E119 Type 2 diabetes mellitus without complications: Secondary | ICD-10-CM | POA: Diagnosis not present

## 2015-03-11 DIAGNOSIS — I4891 Unspecified atrial fibrillation: Secondary | ICD-10-CM

## 2015-03-11 DIAGNOSIS — Z8249 Family history of ischemic heart disease and other diseases of the circulatory system: Secondary | ICD-10-CM | POA: Insufficient documentation

## 2015-03-11 DIAGNOSIS — I10 Essential (primary) hypertension: Secondary | ICD-10-CM | POA: Insufficient documentation

## 2015-03-11 DIAGNOSIS — F419 Anxiety disorder, unspecified: Secondary | ICD-10-CM | POA: Diagnosis not present

## 2015-03-11 DIAGNOSIS — Z880 Allergy status to penicillin: Secondary | ICD-10-CM | POA: Diagnosis not present

## 2015-03-11 DIAGNOSIS — Z794 Long term (current) use of insulin: Secondary | ICD-10-CM | POA: Diagnosis not present

## 2015-03-11 DIAGNOSIS — K219 Gastro-esophageal reflux disease without esophagitis: Secondary | ICD-10-CM | POA: Insufficient documentation

## 2015-03-11 HISTORY — PX: CARDIOVERSION: SHX1299

## 2015-03-11 LAB — GLUCOSE, CAPILLARY: Glucose-Capillary: 148 mg/dL — ABNORMAL HIGH (ref 65–99)

## 2015-03-11 SURGERY — CARDIOVERSION
Anesthesia: Monitor Anesthesia Care

## 2015-03-11 MED ORDER — LACTATED RINGERS IV SOLN: INTRAVENOUS | Status: DC | PRN

## 2015-03-11 MED ORDER — AMIODARONE HCL 200 MG PO TABS: 200.0000 mg | ORAL_TABLET | Freq: Every day | ORAL | Status: DC

## 2015-03-11 MED ORDER — LIDOCAINE HCL (CARDIAC) 20 MG/ML IV SOLN
INTRAVENOUS | Status: DC | PRN
  Administered 2015-03-11: 50 mg via INTRATRACHEAL

## 2015-03-11 MED ORDER — PROPOFOL 10 MG/ML IV BOLUS
INTRAVENOUS | Status: DC | PRN
  Administered 2015-03-11: 70 mg via INTRAVENOUS

## 2015-03-11 MED ORDER — SODIUM CHLORIDE 0.9 % IV SOLN
INTRAVENOUS | Status: DC | PRN
  Administered 2015-03-11: 11:00:00 via INTRAVENOUS

## 2015-03-11 NOTE — Anesthesia Preprocedure Evaluation (Addendum)
Anesthesia Evaluation  Patient identified by MRN, date of birth, ID band Patient awake    Reviewed: Allergy & Precautions, NPO status , Patient's Chart, lab work & pertinent test results  History of Anesthesia Complications Negative for: history of anesthetic complications  Airway Mallampati: II  TM Distance: >3 FB Neck ROM: Full    Dental  (+) Teeth Intact, Dental Advidsory Given   Pulmonary shortness of breath and with exertion,    breath sounds clear to auscultation       Cardiovascular hypertension, On Medications +CHF   Rhythm:Irregular     Neuro/Psych Anxiety    GI/Hepatic GERD  Medicated,  Endo/Other  diabetes  Renal/GU      Musculoskeletal  (+) Arthritis ,   Abdominal   Peds  Hematology   Anesthesia Other Findings   Reproductive/Obstetrics                            Anesthesia Physical Anesthesia Plan  ASA: III  Anesthesia Plan: MAC   Post-op Pain Management:    Induction: Intravenous  Airway Management Planned: Mask  Additional Equipment: None  Intra-op Plan:   Post-operative Plan:   Informed Consent: I have reviewed the patients History and Physical, chart, labs and discussed the procedure including the risks, benefits and alternatives for the proposed anesthesia with the patient or authorized representative who has indicated his/her understanding and acceptance.   Dental Advisory Given  Plan Discussed with: CRNA  Anesthesia Plan Comments:        Anesthesia Quick Evaluation

## 2015-03-11 NOTE — Interval H&P Note (Signed)
History and Physical Interval Note:  03/11/2015 12:06 PM  Kristine [name]  has presented today for surgery, with the diagnosis of A FIB   The various methods of treatment have been discussed with the patient and family. After consideration of risks, benefits and other options for treatment, the patient has consented to  Procedure(s): CARDIOVERSION (N/A) as a surgical intervention .  The patient's history has been reviewed, patient examined, no change in status, stable for surgery.  I have reviewed the patient's chart and labs.  Questions were answered to the patient's satisfaction.    Barnett Elzey C. Oval Linsey, MD, [facility]  03/11/2015 12:06 PM

## 2015-03-11 NOTE — Anesthesia Procedure Notes (Signed)
Procedure Name: MAC Date/Time: 03/11/2015 12:00 PM Performed by: Neldon Newport Pre-anesthesia Checklist: Timeout performed, Patient being monitored, Suction available, Emergency Drugs available and Patient identified Patient Re-evaluated:Patient Re-evaluated prior to inductionOxygen Delivery Method: Ambu bag Preoxygenation: Pre-oxygenation with 100% oxygen Dental Injury: Injury to tongue

## 2015-03-11 NOTE — Transfer of Care (Signed)
Immediate Anesthesia Transfer of Care Note  Patient: Kristine [name]  Procedure(s) Performed: Procedure(s): CARDIOVERSION (N/A)  Patient Location: Endoscopy Unit  Anesthesia Type:MAC  Level of Consciousness: awake, alert  and oriented  Airway & Oxygen Therapy: Patient Spontanous Breathing and Patient connected to nasal cannula oxygen  Post-op Assessment: Report given to RN, Post -op Vital signs reviewed and stable and Patient moving all extremities X 4  Post vital signs: Reviewed and stable  Last Vitals:  Filed Vitals:   03/11/15 1131  BP: 159/82  Pulse: 80  Temp: 36.4 C  Resp: 19    Complications: No apparent anesthesia complications

## 2015-03-11 NOTE — CV Procedure (Signed)
Electrical Cardioversion Procedure Note Kristine [name] [id number] [birth year]  Procedure: Electrical Cardioversion Indications:  Atrial Fibrillation  Procedure Details Consent: Risks of procedure as well as the alternatives and risks of each were explained to the (patient/caregiver).  Consent for procedure obtained. Time Out: Verified patient identification, verified procedure, site/side was marked, verified correct patient position, special equipment/implants available, medications/allergies/relevent history reviewed, required imaging and test results available.  Performed  Patient placed on cardiac monitor, pulse oximetry, supplemental oxygen as necessary.  Sedation given: Propofol 70 mg, Lidocaine 40 mg Pacer pads placed anterior and posterior chest.  Cardioverted 1 time(s).  Cardioverted at 150J.  Evaluation Findings: Post procedure EKG shows: NSR Complications: None Patient did tolerate procedure well.   Kristine Coard C. Oval Linsey, MD, [facility]   03/11/2015, 12:13 PM

## 2015-03-11 NOTE — Discharge Instructions (Signed)
Electrical Cardioversion, Care After °Refer to this sheet in the next few weeks. These instructions provide you with information on caring for yourself after your procedure. Your health care provider may also give you more specific instructions. Your treatment has been planned according to current medical practices, but problems sometimes occur. Call your health care provider if you have any problems or questions after your procedure. °WHAT TO EXPECT AFTER THE PROCEDURE °After your procedure, it is typical to have the following sensations: °· Some redness on the skin where the shocks were delivered. If this is tender, a sunburn lotion or hydrocortisone cream may help. °· Possible return of an abnormal heart rhythm within hours or days after the procedure. °HOME CARE INSTRUCTIONS °· Take medicines only as directed by your health care provider. Be sure you understand how and when to take your medicine. °· Learn how to feel your pulse and check it often. °· Limit your activity for 48 hours after the procedure or as directed by your health care provider. °· Avoid or minimize caffeine and other stimulants as directed by your health care provider. °SEEK MEDICAL CARE IF: °· You feel like your heart is beating too fast or your pulse is not regular. °· You have any questions about your medicines. °· You have bleeding that will not stop. °SEEK IMMEDIATE MEDICAL CARE IF: °· You are dizzy or feel faint. °· It is hard to breathe or you feel short of breath. °· There is a change in discomfort in your chest. °· Your speech is slurred or you have trouble moving an arm or leg on one side of your body. °· You get a serious muscle cramp that does not go away. °· Your fingers or toes turn cold or blue. °  °This information is not intended to replace advice given to you by your health care provider. Make sure you discuss any questions you have with your health care provider. °  °Document Released: 12/28/2012 Document Revised: 03/30/2014  Document Reviewed: 12/28/2012 °Elsevier Interactive Patient Education ©2016 [employer] ° °

## 2015-03-11 NOTE — H&P (View-Only) (Signed)
Patient ID: Kristine [name], female   DOB: 09/13/1945, 69 y.o.   MRN: [id number]     Primary Care Physician: Kristine Ringer, MD Referring Physician: Dr. Redge Gainer Kristine [name] is a 69 y.o. female with a h/o persistent afib, failing flecainide and cardioversion in the past, that was recently seen by Dr. Rayann [name] and given options to restore sinus rhythm.  Treatment with amiodarone was decided on and she is here for f/u after loading on amiodarone  200 mg bid for the last month. She continues to be in afib with rate control but feels better on current treatment. She will be set up for cardioversion today.  Today, she denies symptoms of palpitations, chest pain, shortness of breath, orthopnea, PND, lower extremity edema, dizziness, presyncope, syncope, or neurologic sequela. The patient is tolerating medications without difficulties and is otherwise without complaint today.   Past Medical History  Diagnosis Date  . Persistent atrial fibrillation (Kristine [name])   . Morbid obesity (Kristine [name])   . Pneumonia 2011  . Diabetes mellitus   . HTN (hypertension)   . Abnormal cardiovascular function study     MYOVIEW SHOWING ANTERIOR WALL ATTENUATION  . High cholesterol   . Scoliosis   . Spinal stenosis   . Anxiety   . Osteoarthritis   . Osteoporosis   . Mild aortic sclerosis (Kristine [name])   . Degenerative disc disease   . Vitamin D deficiency   . CHF (congestive heart failure) (Kristine [name])   . Pneumonia   . GERD (gastroesophageal reflux disease)   . Lichen planus   . Anasarca    Past Surgical History  Procedure Laterality Date  . Finger fracture surgery    . Cardiac catheterization  11/16/09    SMOOTH AND NORMAL  . Total abdominal hysterectomy    . Laparoscopic cholecystectomy    . Bladder suspension    . Colonoscopy  08/14/08  . Cardioversion N/A 01/07/2015    Procedure: CARDIOVERSION;  Surgeon: Skeet Latch, MD;  Location: [place];  Service: Cardiovascular;  Laterality: N/A;    Current Outpatient  Prescriptions  Medication Sig Dispense Refill  . ALPRAZolam (XANAX) 0.25 MG tablet Take 0.125-0.25 mg by mouth 2 (two) times daily as needed for anxiety.   0  . amiodarone (PACERONE) 200 MG tablet Take 1 tablet (200 mg total) by mouth 2 (two) times daily. 180 tablet 3  . BD PEN NEEDLE NANO U/F 32G X 4 MM MISC Inject 1 each as directed See admin instructions. Use pen needles with insulin pens daily  11  . esomeprazole (NEXIUM) 40 MG capsule Take 40 mg by mouth daily as needed (for acid reflux).     . furosemide (LASIX) 80 MG tablet Take 80 mg by mouth 2 (two) times daily.     Marland Kitchen gabapentin (NEURONTIN) 100 MG capsule Take 100 mg by mouth at bedtime.    Marland Kitchen HUMALOG KWIKPEN 200 UNIT/ML SOPN Inject 6 Units into the skin 3 (three) times daily with meals.  6  . KLOR-CON 10 10 MEQ tablet Take 20 mEq by mouth daily.  11  . LANTUS SOLOSTAR 100 UNIT/ML Solostar Pen Inject 50 Units as directed 2 (two) times daily.  5  . losartan (COZAAR) 100 MG tablet TAKE 1 TABLET BY MOUTH EVERY DAY (Patient taking differently: TAKE 100 MG BY MOUTH EVERY DAY) 90 tablet 1  . metFORMIN (GLUCOPHAGE) 500 MG tablet Take 500-1,000 mg by mouth 2 (two) times daily with a meal. 1000 mg in the AM and 500mg   at night    . metolazone (ZAROXOLYN) 2.5 MG tablet Take 2.5 mg by mouth daily as needed (for shortness of breath).     . metoprolol succinate (TOPROL-XL) 50 MG 24 hr tablet Take 2 tablets by mouth in the morning and 1 tablet by mouth in the pm 270 tablet 3  . metoprolol succinate (TOPROL-XL) 50 MG 24 hr tablet Take 2 tablets (100mg ) in the AM and 1 tablet (50mg ) in the PM by mouth. 90 tablet 3  . rivaroxaban (XARELTO) 20 MG TABS tablet Take 20 mg by mouth every morning.    . temazepam (RESTORIL) 15 MG capsule Take 15 mg by mouth at bedtime as needed for sleep.    . Vitamin D, Ergocalciferol, (DRISDOL) 50000 UNITS CAPS Take 50,000 Units by mouth every 7 (seven) days. Takes on Thursday    . XOPENEX HFA 45 MCG/ACT inhaler Take 1 puff by  mouth 4 (four) times daily as needed for wheezing.   6   No current facility-administered medications for this encounter.    Allergies  Allergen Reactions  . Albuterol Other (See Comments)    REACTION: Tachycardia- AFib  . Epinephrine Other (See Comments)    Increases heart rate per patient.   . Penicillins Other (See Comments)    REACTION: flushing \\T \ hot  . Bee Venom Other (See Comments)    "makes me nervous"  . Ivp Dye [Iodinated Diagnostic Agents] Other (See Comments)    flushing    Social History   Social History  . Marital Status: Married    Spouse Name: N/A  . Number of Children: N/A  . Years of Education: N/A   Occupational History  . Not on file.   Social History Main Topics  . Smoking status: Never Smoker   . Smokeless tobacco: Never Used  . Alcohol Use: No  . Drug Use: No  . Sexual Activity: Not on file   Other Topics Concern  . Not on file   Social History Narrative   Pt lives in [place] with spouse.   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    Family History  Problem Relation Age of Onset  . Stroke Father   . Hypertension Father   . Atrial fibrillation Father     HAD MURMUR  . Heart failure Mother   . Hypertension Brother   . Hypertension Sister   . Hypertension Son   . Congestive Heart Failure Mother   . Hypertension Mother   . CAD Sister     EARLY  . CAD Brother     EARLY    ROS- All systems are reviewed and negative except as per the HPI above  Physical Exam: Filed Vitals:   03/06/15 1140  BP: 118/78  Pulse: 87  Height: 5\' 8"  (1.727 m)  Weight: 258 lb 6.4 oz (117.209 kg)    GEN- The patient is well appearing, alert and oriented x 3 today.   Head- normocephalic, atraumatic Eyes-  Sclera clear, conjunctiva pink Ears- hearing intact Oropharynx- clear Neck- supple, no JVP Lymph- no cervical lymphadenopathy Lungs- Clear to  ausculation bilaterally, normal work of breathing Heart- Regular rate and rhythm, no murmurs, rubs or gallops, PMI not laterally displaced GI- soft, NT, ND, + BS Extremities- no clubbing, cyanosis, or edema MS- no significant deformity or atrophy Skin- no rash or lesion Psych- euthymic mood, full affect Neuro- strength  and sensation are intact  EKG-afib with v rate at 87 bpm, rad, qrs int 92 ms, qtc 534 ms Epic records reviewed  Assessment and Plan: 1. Persistent symptomatic afib Continue amiodarone at 200 mg bid Scheduled for cardioversion 12/19 After cardioversion reduce amiodarone to 200 mg q day. States no missed doses of xarelto Bmet, cbc today  F/u in afib clinic one week after cardioversion 1/11 with Dr. Rayann [name]

## 2015-03-12 ENCOUNTER — Encounter (HOSPITAL_COMMUNITY): Payer: Self-pay | Admitting: Cardiovascular Disease

## 2015-03-12 ENCOUNTER — Telehealth (HOSPITAL_COMMUNITY): Payer: Self-pay | Admitting: *Deleted

## 2015-03-12 ENCOUNTER — Encounter (HOSPITAL_COMMUNITY): Payer: Self-pay | Admitting: Nurse Practitioner

## 2015-03-12 NOTE — Telephone Encounter (Signed)
Received message via MyChart from patient's daughter stating she was having difficulty with shortness of breath last night and did not sleep well after cardioversion yesterday.  Called and checked on patient --- states she did not sleep well and had to sleep with an extra pillow last night.  States this morning when she got up she felt better after "stirring around" and took her PRN Zaroxoyln along her with normal dose of lasix and has already felt a difference.  She does not sound short of breath over the phone during our conversation. She did receive IV fluids yesterday with cardioversion and then had food from friends after that was more heavily salted than she normally eats.  She reports her HR is 52 BP 140/82. Told her to keep log of her HR if consistently in 50s and has symptoms of lightheadedness, dizziness, inc SOB to call back as her toprol dose may need to be adjusted now that she is in NSR. Patient verbalized understanding and will call if further issues.

## 2015-03-12 NOTE — Anesthesia Postprocedure Evaluation (Signed)
Anesthesia Post Note  Patient: Kristine [name]  Procedure(s) Performed: Procedure(s) (LRB): CARDIOVERSION (N/A)  Patient location during evaluation: Endoscopy Anesthesia Type: MAC Level of consciousness: awake Pain management: pain level controlled Vital Signs Assessment: post-procedure vital signs reviewed and stable Respiratory status: spontaneous breathing Cardiovascular status: stable Postop Assessment: no signs of nausea or vomiting Anesthetic complications: no    Last Vitals:  Filed Vitals:   03/11/15 1235 03/11/15 1240  BP: 130/63 129/58  Pulse: 51 50  Temp:    Resp: 15 18    Last Pain: There were no vitals filed for this visit.               Abdulai Blaylock

## 2015-03-20 ENCOUNTER — Encounter (HOSPITAL_COMMUNITY): Payer: Self-pay | Admitting: Nurse Practitioner

## 2015-03-20 ENCOUNTER — Ambulatory Visit (HOSPITAL_COMMUNITY)
Admission: RE | Admit: 2015-03-20 | Discharge: 2015-03-20 | Disposition: A | Discharge status: Home / Self Care | Payer: Medicare Other | Source: Ambulatory Visit | Attending: Nurse Practitioner | Admitting: Nurse Practitioner

## 2015-03-20 VITALS — BP 142/74 | HR 57 | Ht 68.0 in | Wt 260.2 lb

## 2015-03-20 DIAGNOSIS — I10 Essential (primary) hypertension: Secondary | ICD-10-CM | POA: Diagnosis not present

## 2015-03-20 DIAGNOSIS — I481 Persistent atrial fibrillation: Secondary | ICD-10-CM | POA: Diagnosis present

## 2015-03-20 DIAGNOSIS — I4819 Other persistent atrial fibrillation: Secondary | ICD-10-CM

## 2015-03-20 DIAGNOSIS — R609 Edema, unspecified: Secondary | ICD-10-CM | POA: Insufficient documentation

## 2015-03-20 NOTE — Progress Notes (Signed)
Patient ID: Kristine [name], female   DOB: [birth year], 69 y.o.   MRN: [id number]     Primary Care Physician: Tivis Ringer, MD Referring Physician: Dr. Redge Gainer Kristine [name] is a 69 y.o. female with a h/o persistent afib that is s/p amiodarone loading and DCCV 12/19..  Failed flecainide/DCCV in October .She has been maintaining SR since cardioversion. She is beginning to feel better, able to walk longer distances with less shortness of breath. She did not feel that well right after cardioversion but took a zaroxolyn and felt better with one day.  Today, she denies symptoms of palpitations, chest pain, shortness of breath, orthopnea, PND, lower extremity edema, dizziness, presyncope, syncope, or neurologic sequela. The patient is tolerating medications without difficulties and is otherwise without complaint today.   Past Medical History  Diagnosis Date  . Persistent atrial fibrillation ([place])   . Morbid obesity ([place])   . Pneumonia 2011  . Diabetes mellitus   . HTN (hypertension)   . Abnormal cardiovascular function study     MYOVIEW SHOWING ANTERIOR WALL ATTENUATION  . High cholesterol   . Scoliosis   . Spinal stenosis   . Anxiety   . Osteoarthritis   . Osteoporosis   . Mild aortic sclerosis ([place])   . Degenerative disc disease   . Vitamin D deficiency   . CHF (congestive heart failure) ([place])   . Pneumonia   . GERD (gastroesophageal reflux disease)   . Lichen planus   . Anasarca    Past Surgical History  Procedure Laterality Date  . Finger fracture surgery    . Cardiac catheterization  11/16/09    SMOOTH AND NORMAL  . Total abdominal hysterectomy    . Laparoscopic cholecystectomy    . Bladder suspension    . Colonoscopy  08/14/08  . Cardioversion N/A 01/07/2015    Procedure: CARDIOVERSION;  Surgeon: Skeet Latch, MD;  Location: [place];  Service: Cardiovascular;  Laterality: N/A;  . Cardioversion N/A 03/11/2015    Procedure: CARDIOVERSION;  Surgeon:  Skeet Latch, MD;  Location: [place];  Service: Cardiovascular;  Laterality: N/A;    Current Outpatient Prescriptions  Medication Sig Dispense Refill  . ALPRAZolam (XANAX) 0.25 MG tablet Take 0.125-0.25 mg by mouth 2 (two) times daily as needed for anxiety.   0  . amiodarone (PACERONE) 200 MG tablet Take 1 tablet (200 mg total) by mouth daily. 180 tablet 3  . BD PEN NEEDLE NANO U/F 32G X 4 MM MISC Inject 1 each as directed See admin instructions. Use pen needles with insulin pens daily  11  . esomeprazole (NEXIUM) 40 MG capsule Take 40 mg by mouth daily as needed (for acid reflux).     . furosemide (LASIX) 80 MG tablet Take 80 mg by mouth 2 (two) times daily.     Marland Kitchen gabapentin (NEURONTIN) 100 MG capsule Take 100 mg by mouth at bedtime.    Marland Kitchen HUMALOG KWIKPEN 200 UNIT/ML SOPN Inject 6 Units into the skin 3 (three) times daily with meals.  6  . KLOR-CON 10 10 MEQ tablet Take 20 mEq by mouth daily.  11  . LANTUS SOLOSTAR 100 UNIT/ML Solostar Pen Inject 50 Units as directed 2 (two) times daily.  5  . losartan (COZAAR) 100 MG tablet TAKE 1 TABLET BY MOUTH EVERY DAY (Patient taking differently: TAKE 100 MG BY MOUTH EVERY DAY) 90 tablet 1  . metFORMIN (GLUCOPHAGE) 500 MG tablet Take 500-1,000 mg by mouth 2 (two) times daily with a  meal. 1000 mg in the AM and 500mg  at night    . metolazone (ZAROXOLYN) 2.5 MG tablet Take 2.5 mg by mouth daily as needed (for shortness of breath).     . metoprolol succinate (TOPROL-XL) 50 MG 24 hr tablet Take 2 tablets by mouth in the morning and 1 tablet by mouth in the pm 270 tablet 3  . metoprolol succinate (TOPROL-XL) 50 MG 24 hr tablet Take 2 tablets (100mg ) in the AM and 1 tablet (50mg ) in the PM by mouth. 90 tablet 3  . rivaroxaban (XARELTO) 20 MG TABS tablet Take 20 mg by mouth every morning.    . temazepam (RESTORIL) 15 MG capsule Take 15 mg by mouth at bedtime as needed for sleep.    . Vitamin D, Ergocalciferol, (DRISDOL) 50000 UNITS CAPS Take 50,000 Units  by mouth every 7 (seven) days. Takes on Thursday    . XOPENEX HFA 45 MCG/ACT inhaler Take 1 puff by mouth 4 (four) times daily as needed for wheezing.   6   No current facility-administered medications for this encounter.    Allergies  Allergen Reactions  . Albuterol Other (See Comments)    REACTION: Tachycardia- AFib  . Epinephrine Other (See Comments)    Increases heart rate per patient.   . Penicillins Other (See Comments)    REACTION: flushing \\T \ hot  . Bee Venom Other (See Comments)    "makes me nervous"  . Ivp Dye [Iodinated Diagnostic Agents] Other (See Comments)    flushing    Social History   Social History  . Marital Status: Married    Spouse Name: N/A  . Number of Children: N/A  . Years of Education: N/A   Occupational History  . Not on file.   Social History Main Topics  . Smoking status: Never Smoker   . Smokeless tobacco: Never Used  . Alcohol Use: No  . Drug Use: No  . Sexual Activity: Not on file   Other Topics Concern  . Not on file   Social History Narrative   Pt lives in [place] with spouse.   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    Family History  Problem Relation Age of Onset  . Stroke Father   . Hypertension Father   . Atrial fibrillation Father     HAD MURMUR  . Heart failure Mother   . Hypertension Brother   . Hypertension Sister   . Hypertension Son   . Congestive Heart Failure Mother   . Hypertension Mother   . CAD Sister     EARLY  . CAD Brother     EARLY    ROS- All systems are reviewed and negative except as per the HPI above  Physical Exam: Filed Vitals:   03/20/15 1035  BP: 142/74  Pulse: 57  Height: 5\' 8"  (1.727 m)  Weight: 260 lb 3.2 oz (118.026 kg)    GEN- The patient is well appearing, alert and oriented x 3 today.   Head- normocephalic, atraumatic Eyes-  Sclera clear, conjunctiva pink Ears- hearing  intact Oropharynx- clear Neck- supple, no JVP Lymph- no cervical lymphadenopathy Lungs- Clear to ausculation bilaterally, normal work of breathing Heart- Regular rate and rhythm, no murmurs, rubs or gallops, PMI not laterally displaced GI- soft, NT, ND, + BS Extremities- no clubbing, cyanosis, or edema MS- no significant deformity or atrophy Skin- no rash or  lesion Psych- euthymic mood, full affect Neuro- strength and sensation are intact  EKG- Sinus brady at 57 bpm, with IRBBB, prolonged qtc at 511 ms. Epic records reviewed  Assessment and Plan: 1. Persistent symptomatic afib In SR with amiodarone and  successful cardioversion 12/19 Continue amiodarone at 200 mg a day as well as metoprolol Continue xarelto  2. Edema Stable  3. HTN  Stable  F/u with Dr. Rayann Heman as already scheduled 1/11 and she would like to be followed by him in the [place] clinic if possible  Butch Penny C. Reika Callanan, [facility] [address] [place], [place] [address] [phone]

## 2015-03-28 DIAGNOSIS — M25551 Pain in right hip: Secondary | ICD-10-CM | POA: Diagnosis not present

## 2015-03-28 DIAGNOSIS — R5383 Other fatigue: Secondary | ICD-10-CM | POA: Diagnosis not present

## 2015-03-28 DIAGNOSIS — M25532 Pain in left wrist: Secondary | ICD-10-CM | POA: Diagnosis not present

## 2015-03-28 DIAGNOSIS — M25531 Pain in right wrist: Secondary | ICD-10-CM | POA: Diagnosis not present

## 2015-03-28 DIAGNOSIS — M4807 Spinal stenosis, lumbosacral region: Secondary | ICD-10-CM | POA: Diagnosis not present

## 2015-03-28 DIAGNOSIS — M5136 Other intervertebral disc degeneration, lumbar region: Secondary | ICD-10-CM | POA: Diagnosis not present

## 2015-03-28 DIAGNOSIS — M7061 Trochanteric bursitis, right hip: Secondary | ICD-10-CM | POA: Diagnosis not present

## 2015-03-28 DIAGNOSIS — R768 Other specified abnormal immunological findings in serum: Secondary | ICD-10-CM | POA: Diagnosis not present

## 2015-04-03 ENCOUNTER — Encounter: Payer: Self-pay | Admitting: Internal Medicine

## 2015-04-03 ENCOUNTER — Encounter: Payer: Self-pay | Admitting: *Deleted

## 2015-04-03 ENCOUNTER — Telehealth: Payer: Self-pay | Admitting: Internal Medicine

## 2015-04-03 ENCOUNTER — Ambulatory Visit (INDEPENDENT_AMBULATORY_CARE_PROVIDER_SITE_OTHER): Payer: Medicare HMO | Admitting: Internal Medicine

## 2015-04-03 VITALS — BP 136/90 | HR 57 | Ht 68.0 in | Wt 256.4 lb

## 2015-04-03 DIAGNOSIS — E669 Obesity, unspecified: Secondary | ICD-10-CM | POA: Diagnosis not present

## 2015-04-03 DIAGNOSIS — I119 Hypertensive heart disease without heart failure: Secondary | ICD-10-CM

## 2015-04-03 DIAGNOSIS — I481 Persistent atrial fibrillation: Secondary | ICD-10-CM

## 2015-04-03 DIAGNOSIS — I4891 Unspecified atrial fibrillation: Secondary | ICD-10-CM | POA: Diagnosis not present

## 2015-04-03 DIAGNOSIS — I5032 Chronic diastolic (congestive) heart failure: Secondary | ICD-10-CM | POA: Diagnosis not present

## 2015-04-03 DIAGNOSIS — I4819 Other persistent atrial fibrillation: Secondary | ICD-10-CM

## 2015-04-03 LAB — BASIC METABOLIC PANEL
BUN: 36 mg/dL — AB (ref 7–25)
CHLORIDE: 98 mmol/L (ref 98–110)
CO2: 30 mmol/L (ref 20–31)
CREATININE: 1.76 mg/dL — AB (ref 0.50–0.99)
Calcium: 9.4 mg/dL (ref 8.6–10.4)
Glucose, Bld: 171 mg/dL — ABNORMAL HIGH (ref 65–99)
Potassium: 3.9 mmol/L (ref 3.5–5.3)
Sodium: 138 mmol/L (ref 135–146)

## 2015-04-03 LAB — HEPATIC FUNCTION PANEL
ALT: 19 U/L (ref 6–29)
AST: 19 U/L (ref 10–35)
Albumin: 3.7 g/dL (ref 3.6–5.1)
Alkaline Phosphatase: 77 U/L (ref 33–130)
BILIRUBIN INDIRECT: 0.4 mg/dL (ref 0.2–1.2)
Bilirubin, Direct: 0.1 mg/dL (ref <=0.2)
TOTAL PROTEIN: 6.3 g/dL (ref 6.1–8.1)
Total Bilirubin: 0.5 mg/dL (ref 0.2–1.2)

## 2015-04-03 NOTE — Telephone Encounter (Signed)
Checking percert for echo scheduled for 04-04-15 = Dr. Rayann Heman

## 2015-04-03 NOTE — Patient Instructions (Signed)
Medication Instructions:  Your physician recommends that you continue on your current medications as directed. Please refer to the Current Medication list given to you today.   Labwork: Your physician recommends that you return for lab work in: BMP,Liver,TSH,T4   Testing/Procedures:  Your physician has requested that you have an echocardiogram. Echocardiography is a painless test that uses sound waves to create images of your heart. It provides your doctor with information about the size and shape of your heart and how well your heart's chambers and valves are working. This procedure takes approximately one hour. There are no restrictions for this procedure.---in [place].  Will need an EKG same day as echo    Follow-Up:  Your physician recommends that you schedule a follow-up appointment in: 3 months wit Dr Acie Fredrickson and 6 months with Dr Rayann Heman in [place]   Any Other Special Instructions Will Be Listed Below (If Applicable).     If you need a refill on your cardiac medications before your next appointment, please call your pharmacy.     Any Other Special Instructions Will Be Listed Below (If Applicable).     If you need a refill on your cardiac medications before your next appointment, please call your pharmacy.

## 2015-04-03 NOTE — Progress Notes (Signed)
Primary Care Physician: Tivis Ringer, MD Referring Physician:  Dr Kristine [name] is a 70 y.o. female with a h/o obesity, DM, HTN, and persistent afib who presents for EP follow-up.  She has done very well since starting amiodarone.  Maintaining sinus rhythm and pleased with results.  Exercise tolerance is improved.  She recently had R hip bursitis but feels that this is improving. Today, she denies symptoms of chest pain, orthopnea, PND, lower extremity edema, presyncope, syncope, or neurologic sequela. The patient is tolerating medications without difficulties and is otherwise without complaint today.   Past Medical History  Diagnosis Date  . Persistent atrial fibrillation ([place])   . Morbid obesity ([place])   . Pneumonia 2011  . Diabetes mellitus   . HTN (hypertension)   . Abnormal cardiovascular function study     MYOVIEW SHOWING ANTERIOR WALL ATTENUATION  . High cholesterol   . Scoliosis   . Spinal stenosis   . Anxiety   . Osteoarthritis   . Osteoporosis   . Mild aortic sclerosis ([place])   . Degenerative disc disease   . Vitamin D deficiency   . CHF (congestive heart failure) ([place])   . Pneumonia   . GERD (gastroesophageal reflux disease)   . Lichen planus   . Anasarca    Past Surgical History  Procedure Laterality Date  . Finger fracture surgery    . Cardiac catheterization  11/16/09    SMOOTH AND NORMAL  . Total abdominal hysterectomy    . Laparoscopic cholecystectomy    . Bladder suspension    . Colonoscopy  08/14/08  . Cardioversion N/A 01/07/2015    Procedure: CARDIOVERSION;  Surgeon: Skeet Latch, MD;  Location: [facility];  Service: Cardiovascular;  Laterality: N/A;  . Cardioversion N/A 03/11/2015    Procedure: CARDIOVERSION;  Surgeon: Skeet Latch, MD;  Location: [facility];  Service: Cardiovascular;  Laterality: N/A;    Current Outpatient Prescriptions  Medication Sig Dispense Refill  . ALPRAZolam (XANAX) 0.25 MG tablet Take 0.125-0.25  mg by mouth 2 (two) times daily as needed for anxiety.   0  . amiodarone (PACERONE) 200 MG tablet Take 1 tablet (200 mg total) by mouth daily. 180 tablet 3  . BD PEN NEEDLE NANO U/F 32G X 4 MM MISC Inject 1 each as directed See admin instructions. Use pen needles with insulin pens daily  11  . esomeprazole (NEXIUM) 40 MG capsule Take 40 mg by mouth daily as needed (for acid reflux).     . furosemide (LASIX) 80 MG tablet Take 80 mg by mouth 2 (two) times daily.     Marland Kitchen gabapentin (NEURONTIN) 100 MG capsule Take 300 mg by mouth at bedtime.     Marland Kitchen HUMALOG KWIKPEN 200 UNIT/ML SOPN Inject 6 Units into the skin 3 (three) times daily with meals.  6  . KLOR-CON 10 10 MEQ tablet Take 20 mEq by mouth daily.  11  . LANTUS SOLOSTAR 100 UNIT/ML Solostar Pen Inject 50 Units as directed as directed.   5  . losartan (COZAAR) 100 MG tablet TAKE 1 TABLET BY MOUTH EVERY DAY (Patient taking differently: TAKE 100 MG BY MOUTH EVERY DAY) 90 tablet 1  . metFORMIN (GLUCOPHAGE) 500 MG tablet Take 500-1,000 mg by mouth 2 (two) times daily with a meal. 1000 mg in the AM and 500mg  at night    . metolazone (ZAROXOLYN) 2.5 MG tablet Take 2.5 mg by mouth daily as needed (for shortness of breath).     Marland Kitchen  metoprolol succinate (TOPROL-XL) 50 MG 24 hr tablet Take 2 tablets by mouth in the morning and 1 tablet by mouth in the pm 270 tablet 3  . rivaroxaban (XARELTO) 20 MG TABS tablet Take 20 mg by mouth every morning.    . temazepam (RESTORIL) 15 MG capsule Take 15 mg by mouth at bedtime as needed for sleep.    . Vitamin D, Ergocalciferol, (DRISDOL) 50000 UNITS CAPS Take 50,000 Units by mouth every 7 (seven) days. Takes on Thursday    . XOPENEX HFA 45 MCG/ACT inhaler Take 1 puff by mouth 4 (four) times daily as needed for wheezing.   6   No current facility-administered medications for this visit.    Allergies  Allergen Reactions  . Albuterol Other (See Comments)    REACTION: Tachycardia- AFib  . Epinephrine Other (See Comments)     Increases heart rate per patient.   . Penicillins Other (See Comments)    REACTION: flushing \\T \ hot  . Bee Venom Other (See Comments)    "makes me nervous"  . Ivp Dye [Iodinated Diagnostic Agents] Other (See Comments)    flushing    Social History   Social History  . Marital Status: Married    Spouse Name: N/A  . Number of Children: N/A  . Years of Education: N/A   Occupational History  . Not on file.   Social History Main Topics  . Smoking status: Never Smoker   . Smokeless tobacco: Never Used  . Alcohol Use: No  . Drug Use: No  . Sexual Activity: Not on file   Other Topics Concern  . Not on file   Social History Narrative   Pt lives in [place] with spouse.   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    Family History  Problem Relation Age of Onset  . Stroke Father   . Hypertension Father   . Atrial fibrillation Father     HAD MURMUR  . Heart failure Mother   . Hypertension Brother   . Hypertension Sister   . Hypertension Son   . Congestive Heart Failure Mother   . Hypertension Mother   . CAD Sister     EARLY  . CAD Brother     EARLY    ROS- All systems are reviewed and negative except as per the HPI above  Physical Exam: Filed Vitals:   04/03/15 1034  BP: 136/90  Pulse: 57  Height: 5\' 8"  (1.727 m)  Weight: 256 lb 6.4 oz (116.302 kg)    GEN- The patient is morbidly obese appearing, alert and oriented x 3 today.   Head- normocephalic, atraumatic Eyes-  Sclera clear, conjunctiva pink Ears- hearing intact Oropharynx- clear Neck- supple,  Lungs- Clear to ausculation bilaterally, normal work of breathing Heart- regular rate and rhythm  GI- soft, NT, ND, + BS Extremities- no clubbing, cyanosis, no edema MS- no significant deformity or atrophy Skin- no rash or lesion Psych- euthymic mood, full affect Neuro- strength and sensation are intact  EKG  today reveals sinus rhythm 57 bpm, PR 156 msec, Qtc 498 msec, otherwise normal ekg  AF clinic notes reviewed  Assessment and Plan:  1.  Longstanding persistent afib Doing better with sinus rhythm Lfts/tfts today Continue long term anticoagulation  2. Hypertensive cardiovascular disease 2 gram sodium diet   3. Obesity Body mass index is 38.99 kg/(m^2). Weight loss is  strongly encouraged  4. Diastolic dysfunction bmet today Repeat echo now that she is back in sinus rhythm  5. QT She was told by PCP to increase neurontin from 100mg  daily to 300mg  daily and asks my opinion Ok to increase neurontin I have advised repeat ecg once on increased dose for 2-3 days  Follow-up with Dr Acie Fredrickson Per her request, I will see her in 6 months in [place] If she is doing well at that time, I will likely return her care completely back to Dr Acie Fredrickson  Thompson Grayer MD, [facility] 04/03/2015 11:18 AM

## 2015-04-03 NOTE — Telephone Encounter (Signed)
No precert required 

## 2015-04-04 ENCOUNTER — Ambulatory Visit (INDEPENDENT_AMBULATORY_CARE_PROVIDER_SITE_OTHER): Payer: Medicare HMO

## 2015-04-04 ENCOUNTER — Ambulatory Visit (INDEPENDENT_AMBULATORY_CARE_PROVIDER_SITE_OTHER): Payer: Medicare HMO | Admitting: *Deleted

## 2015-04-04 ENCOUNTER — Other Ambulatory Visit: Payer: Self-pay

## 2015-04-04 VITALS — BP 120/74 | HR 52

## 2015-04-04 DIAGNOSIS — I481 Persistent atrial fibrillation: Secondary | ICD-10-CM | POA: Diagnosis not present

## 2015-04-04 DIAGNOSIS — I480 Paroxysmal atrial fibrillation: Secondary | ICD-10-CM

## 2015-04-04 DIAGNOSIS — I4819 Other persistent atrial fibrillation: Secondary | ICD-10-CM

## 2015-04-04 LAB — T4, FREE: FREE T4: 1.42 ng/dL (ref 0.80–1.80)

## 2015-04-04 LAB — TSH: TSH: 4.499 u[IU]/mL (ref 0.350–4.500)

## 2015-04-04 NOTE — Progress Notes (Signed)
In office for EKG per Dr. Rayann Heman.     EKG reviewed QT is prolonged but appears stable. Continue Neurontin 300mg  daily.  If she does not receive clinical benefit from this increased dose then I would have a low threshold to return to previous dosing of 100mg  daily.  Will defer to PCP  Thompson Grayer MD, [facility] 04/04/2015 8:59 PM

## 2015-04-05 ENCOUNTER — Telehealth: Payer: Self-pay | Admitting: *Deleted

## 2015-04-05 NOTE — Telephone Encounter (Signed)
Left message to return call 

## 2015-04-05 NOTE — Telephone Encounter (Signed)
Patient notified.  EKG & this note fwd to pmd also.

## 2015-04-05 NOTE — Telephone Encounter (Signed)
Laurine Blazer, LPN at [id number] AM    Status: Signed      Expand All Collapse All    In office for EKG per Dr. Rayann Heman.   EKG reviewed QT is prolonged but appears stable. Continue Neurontin 300mg  daily. If she does not receive clinical benefit from this increased dose then I would have a low threshold to return to previous dosing of 100mg  daily. Will defer to PCP  Thompson Grayer MD, [facility] 04/04/2015 8:59 PM

## 2015-04-05 NOTE — Telephone Encounter (Signed)
-----   Message from Thompson Grayer, MD sent at 04/04/2015  9:00 PM EST ----- Thanks!   ----- Message -----    From: Laurine Blazer, LPN    Sent: [id number]  10:48 AM      To: Thompson Grayer, MD

## 2015-04-11 ENCOUNTER — Other Ambulatory Visit: Payer: Self-pay | Admitting: Cardiovascular Disease

## 2015-04-23 DIAGNOSIS — E1159 Type 2 diabetes mellitus with other circulatory complications: Secondary | ICD-10-CM | POA: Diagnosis not present

## 2015-04-23 DIAGNOSIS — N183 Chronic kidney disease, stage 3 (moderate): Secondary | ICD-10-CM | POA: Diagnosis not present

## 2015-04-23 DIAGNOSIS — Z6837 Body mass index (BMI) 37.0-37.9, adult: Secondary | ICD-10-CM | POA: Diagnosis not present

## 2015-04-23 DIAGNOSIS — I10 Essential (primary) hypertension: Secondary | ICD-10-CM | POA: Diagnosis not present

## 2015-04-25 DIAGNOSIS — M7061 Trochanteric bursitis, right hip: Secondary | ICD-10-CM | POA: Diagnosis not present

## 2015-04-25 DIAGNOSIS — R768 Other specified abnormal immunological findings in serum: Secondary | ICD-10-CM | POA: Diagnosis not present

## 2015-04-25 DIAGNOSIS — M25532 Pain in left wrist: Secondary | ICD-10-CM | POA: Diagnosis not present

## 2015-04-25 DIAGNOSIS — R69 Illness, unspecified: Secondary | ICD-10-CM | POA: Diagnosis not present

## 2015-04-25 DIAGNOSIS — M25531 Pain in right wrist: Secondary | ICD-10-CM | POA: Diagnosis not present

## 2015-04-25 DIAGNOSIS — M5136 Other intervertebral disc degeneration, lumbar region: Secondary | ICD-10-CM | POA: Diagnosis not present

## 2015-04-30 ENCOUNTER — Encounter: Payer: Self-pay | Admitting: Gastroenterology

## 2015-05-02 DIAGNOSIS — L821 Other seborrheic keratosis: Secondary | ICD-10-CM | POA: Diagnosis not present

## 2015-05-02 DIAGNOSIS — D225 Melanocytic nevi of trunk: Secondary | ICD-10-CM | POA: Diagnosis not present

## 2015-05-02 DIAGNOSIS — L812 Freckles: Secondary | ICD-10-CM | POA: Diagnosis not present

## 2015-05-02 DIAGNOSIS — D1801 Hemangioma of skin and subcutaneous tissue: Secondary | ICD-10-CM | POA: Diagnosis not present

## 2015-05-30 ENCOUNTER — Encounter (HOSPITAL_COMMUNITY): Payer: Self-pay | Admitting: Emergency Medicine

## 2015-05-30 ENCOUNTER — Inpatient Hospital Stay (HOSPITAL_COMMUNITY)
Admission: EM | Admit: 2015-05-30 | Discharge: 2015-06-02 | DRG: 194 | Disposition: A | Discharge status: Home / Self Care | Payer: Medicare HMO | Attending: Internal Medicine | Admitting: Internal Medicine

## 2015-05-30 ENCOUNTER — Emergency Department (HOSPITAL_COMMUNITY): Payer: Medicare HMO

## 2015-05-30 DIAGNOSIS — J189 Pneumonia, unspecified organism: Secondary | ICD-10-CM | POA: Diagnosis not present

## 2015-05-30 DIAGNOSIS — E785 Hyperlipidemia, unspecified: Secondary | ICD-10-CM | POA: Diagnosis present

## 2015-05-30 DIAGNOSIS — N179 Acute kidney failure, unspecified: Secondary | ICD-10-CM | POA: Diagnosis present

## 2015-05-30 DIAGNOSIS — R0602 Shortness of breath: Secondary | ICD-10-CM | POA: Diagnosis not present

## 2015-05-30 DIAGNOSIS — E860 Dehydration: Secondary | ICD-10-CM | POA: Diagnosis present

## 2015-05-30 DIAGNOSIS — Z7901 Long term (current) use of anticoagulants: Secondary | ICD-10-CM

## 2015-05-30 DIAGNOSIS — K219 Gastro-esophageal reflux disease without esophagitis: Secondary | ICD-10-CM | POA: Diagnosis present

## 2015-05-30 DIAGNOSIS — E1122 Type 2 diabetes mellitus with diabetic chronic kidney disease: Secondary | ICD-10-CM | POA: Diagnosis present

## 2015-05-30 DIAGNOSIS — I4891 Unspecified atrial fibrillation: Secondary | ICD-10-CM | POA: Diagnosis not present

## 2015-05-30 DIAGNOSIS — I129 Hypertensive chronic kidney disease with stage 1 through stage 4 chronic kidney disease, or unspecified chronic kidney disease: Secondary | ICD-10-CM | POA: Diagnosis not present

## 2015-05-30 DIAGNOSIS — I480 Paroxysmal atrial fibrillation: Secondary | ICD-10-CM | POA: Diagnosis present

## 2015-05-30 DIAGNOSIS — F419 Anxiety disorder, unspecified: Secondary | ICD-10-CM | POA: Diagnosis present

## 2015-05-30 DIAGNOSIS — I5033 Acute on chronic diastolic (congestive) heart failure: Secondary | ICD-10-CM | POA: Diagnosis present

## 2015-05-30 DIAGNOSIS — I130 Hypertensive heart and chronic kidney disease with heart failure and stage 1 through stage 4 chronic kidney disease, or unspecified chronic kidney disease: Secondary | ICD-10-CM | POA: Diagnosis present

## 2015-05-30 DIAGNOSIS — I481 Persistent atrial fibrillation: Secondary | ICD-10-CM | POA: Diagnosis present

## 2015-05-30 DIAGNOSIS — E559 Vitamin D deficiency, unspecified: Secondary | ICD-10-CM | POA: Diagnosis present

## 2015-05-30 DIAGNOSIS — E119 Type 2 diabetes mellitus without complications: Secondary | ICD-10-CM

## 2015-05-30 DIAGNOSIS — Z6837 Body mass index (BMI) 37.0-37.9, adult: Secondary | ICD-10-CM | POA: Diagnosis not present

## 2015-05-30 DIAGNOSIS — Z6838 Body mass index (BMI) 38.0-38.9, adult: Secondary | ICD-10-CM

## 2015-05-30 DIAGNOSIS — E1140 Type 2 diabetes mellitus with diabetic neuropathy, unspecified: Secondary | ICD-10-CM | POA: Diagnosis not present

## 2015-05-30 DIAGNOSIS — N183 Chronic kidney disease, stage 3 (moderate): Secondary | ICD-10-CM | POA: Diagnosis present

## 2015-05-30 DIAGNOSIS — D638 Anemia in other chronic diseases classified elsewhere: Secondary | ICD-10-CM | POA: Diagnosis present

## 2015-05-30 DIAGNOSIS — R05 Cough: Secondary | ICD-10-CM | POA: Diagnosis not present

## 2015-05-30 DIAGNOSIS — M1990 Unspecified osteoarthritis, unspecified site: Secondary | ICD-10-CM | POA: Diagnosis present

## 2015-05-30 DIAGNOSIS — Z8249 Family history of ischemic heart disease and other diseases of the circulatory system: Secondary | ICD-10-CM

## 2015-05-30 DIAGNOSIS — D649 Anemia, unspecified: Secondary | ICD-10-CM | POA: Diagnosis present

## 2015-05-30 DIAGNOSIS — I5043 Acute on chronic combined systolic (congestive) and diastolic (congestive) heart failure: Secondary | ICD-10-CM | POA: Diagnosis present

## 2015-05-30 DIAGNOSIS — M810 Age-related osteoporosis without current pathological fracture: Secondary | ICD-10-CM | POA: Diagnosis present

## 2015-05-30 DIAGNOSIS — I5032 Chronic diastolic (congestive) heart failure: Secondary | ICD-10-CM | POA: Diagnosis present

## 2015-05-30 DIAGNOSIS — R001 Bradycardia, unspecified: Secondary | ICD-10-CM | POA: Diagnosis present

## 2015-05-30 DIAGNOSIS — J188 Other pneumonia, unspecified organism: Secondary | ICD-10-CM | POA: Diagnosis not present

## 2015-05-30 DIAGNOSIS — Z823 Family history of stroke: Secondary | ICD-10-CM

## 2015-05-30 DIAGNOSIS — Z794 Long term (current) use of insulin: Secondary | ICD-10-CM

## 2015-05-30 HISTORY — DX: Unspecified chronic bronchitis: J42

## 2015-05-30 HISTORY — DX: Unspecified asthma, uncomplicated: J45.909

## 2015-05-30 HISTORY — DX: Personal history of other diseases of the digestive system: Z87.19

## 2015-05-30 HISTORY — DX: Type 2 diabetes mellitus without complications: E11.9

## 2015-05-30 HISTORY — DX: Unspecified osteoarthritis, unspecified site: M19.90

## 2015-05-30 LAB — CBC WITH DIFFERENTIAL/PLATELET
BASOS ABS: 0.1 10*3/uL (ref 0.0–0.1)
Basophils Relative: 1 %
EOS ABS: 0.2 10*3/uL (ref 0.0–0.7)
Eosinophils Relative: 2 %
HCT: 35.7 % — ABNORMAL LOW (ref 36.0–46.0)
Hemoglobin: 11.6 g/dL — ABNORMAL LOW (ref 12.0–15.0)
Lymphocytes Relative: 29 %
Lymphs Abs: 2.3 10*3/uL (ref 0.7–4.0)
MCH: 28.2 pg (ref 26.0–34.0)
MCHC: 32.5 g/dL (ref 30.0–36.0)
MCV: 86.9 fL (ref 78.0–100.0)
Monocytes Absolute: 0.6 10*3/uL (ref 0.1–1.0)
Monocytes Relative: 8 %
NEUTROS PCT: 60 %
Neutro Abs: 4.6 10*3/uL (ref 1.7–7.7)
PLATELETS: 170 10*3/uL (ref 150–400)
RBC: 4.11 MIL/uL (ref 3.87–5.11)
RDW: 14.3 % (ref 11.5–15.5)
WBC: 7.8 10*3/uL (ref 4.0–10.5)

## 2015-05-30 LAB — BASIC METABOLIC PANEL
ANION GAP: 13 (ref 5–15)
BUN: 25 mg/dL — ABNORMAL HIGH (ref 6–20)
CALCIUM: 9.2 mg/dL (ref 8.9–10.3)
CO2: 27 mmol/L (ref 22–32)
CREATININE: 1.62 mg/dL — AB (ref 0.44–1.00)
Chloride: 98 mmol/L — ABNORMAL LOW (ref 101–111)
GFR, EST AFRICAN AMERICAN: 37 mL/min — AB (ref >60)
GFR, EST NON AFRICAN AMERICAN: 32 mL/min — AB (ref >60)
Glucose, Bld: 141 mg/dL — ABNORMAL HIGH (ref 65–99)
Potassium: 4.2 mmol/L (ref 3.5–5.1)
SODIUM: 138 mmol/L (ref 135–145)

## 2015-05-30 MED ORDER — LEVOFLOXACIN IN D5W 750 MG/150ML IV SOLN
750.0000 mg | Freq: Once | INTRAVENOUS | Status: AC
  Administered 2015-05-30: 750 mg via INTRAVENOUS
  Filled 2015-05-30: qty 150

## 2015-05-30 NOTE — ED Notes (Signed)
[facility] faxed OV Note.

## 2015-05-30 NOTE — ED Provider Notes (Signed)
Patient presented to the ER with often shortness of breath. Patient was seen by her primary care doctor last week and started on Tamiflu and Zithromax. She has not improved. Symptoms are worsening, she is having generalized weakness and shortness of breath. She was sent to the ER today by her primary care doctor to be admitted due to failure of outpatient therapy for pneumonia.  Face to face Exam: HEENT - PERRLA Lungs - CTAB Heart - RRR, no M/R/G Abd - S/NT/ND Neuro - alert, oriented x3  Plan: Admit to medicine  Orpah Greek, MD 05/30/15 2348

## 2015-05-30 NOTE — ED Notes (Signed)
Pt sent here by pcp with dx of pneumonia to be admitted. sts she was also dx with flu on Monday. Pt c/o cough and sob./exertional sob.

## 2015-05-31 ENCOUNTER — Encounter (HOSPITAL_COMMUNITY): Payer: Self-pay | Admitting: Internal Medicine

## 2015-05-31 DIAGNOSIS — I480 Paroxysmal atrial fibrillation: Secondary | ICD-10-CM | POA: Diagnosis not present

## 2015-05-31 DIAGNOSIS — J189 Pneumonia, unspecified organism: Secondary | ICD-10-CM | POA: Diagnosis not present

## 2015-05-31 DIAGNOSIS — I5043 Acute on chronic combined systolic (congestive) and diastolic (congestive) heart failure: Secondary | ICD-10-CM | POA: Diagnosis present

## 2015-05-31 DIAGNOSIS — E785 Hyperlipidemia, unspecified: Secondary | ICD-10-CM | POA: Diagnosis not present

## 2015-05-31 DIAGNOSIS — Z6838 Body mass index (BMI) 38.0-38.9, adult: Secondary | ICD-10-CM | POA: Diagnosis not present

## 2015-05-31 DIAGNOSIS — Z823 Family history of stroke: Secondary | ICD-10-CM | POA: Diagnosis not present

## 2015-05-31 DIAGNOSIS — D638 Anemia in other chronic diseases classified elsewhere: Secondary | ICD-10-CM | POA: Diagnosis present

## 2015-05-31 DIAGNOSIS — F419 Anxiety disorder, unspecified: Secondary | ICD-10-CM | POA: Diagnosis present

## 2015-05-31 DIAGNOSIS — E559 Vitamin D deficiency, unspecified: Secondary | ICD-10-CM | POA: Diagnosis not present

## 2015-05-31 DIAGNOSIS — M1990 Unspecified osteoarthritis, unspecified site: Secondary | ICD-10-CM | POA: Diagnosis present

## 2015-05-31 DIAGNOSIS — I130 Hypertensive heart and chronic kidney disease with heart failure and stage 1 through stage 4 chronic kidney disease, or unspecified chronic kidney disease: Secondary | ICD-10-CM | POA: Diagnosis not present

## 2015-05-31 DIAGNOSIS — I5033 Acute on chronic diastolic (congestive) heart failure: Secondary | ICD-10-CM | POA: Diagnosis present

## 2015-05-31 DIAGNOSIS — E860 Dehydration: Secondary | ICD-10-CM | POA: Diagnosis present

## 2015-05-31 DIAGNOSIS — Z7901 Long term (current) use of anticoagulants: Secondary | ICD-10-CM | POA: Diagnosis not present

## 2015-05-31 DIAGNOSIS — K219 Gastro-esophageal reflux disease without esophagitis: Secondary | ICD-10-CM | POA: Diagnosis present

## 2015-05-31 DIAGNOSIS — I481 Persistent atrial fibrillation: Secondary | ICD-10-CM | POA: Diagnosis not present

## 2015-05-31 DIAGNOSIS — N183 Chronic kidney disease, stage 3 (moderate): Secondary | ICD-10-CM | POA: Diagnosis present

## 2015-05-31 DIAGNOSIS — Z794 Long term (current) use of insulin: Secondary | ICD-10-CM | POA: Diagnosis not present

## 2015-05-31 DIAGNOSIS — E1122 Type 2 diabetes mellitus with diabetic chronic kidney disease: Secondary | ICD-10-CM | POA: Diagnosis not present

## 2015-05-31 DIAGNOSIS — N179 Acute kidney failure, unspecified: Secondary | ICD-10-CM | POA: Diagnosis not present

## 2015-05-31 DIAGNOSIS — I5032 Chronic diastolic (congestive) heart failure: Secondary | ICD-10-CM | POA: Diagnosis not present

## 2015-05-31 DIAGNOSIS — R001 Bradycardia, unspecified: Secondary | ICD-10-CM | POA: Diagnosis present

## 2015-05-31 DIAGNOSIS — Z8249 Family history of ischemic heart disease and other diseases of the circulatory system: Secondary | ICD-10-CM | POA: Diagnosis not present

## 2015-05-31 DIAGNOSIS — M810 Age-related osteoporosis without current pathological fracture: Secondary | ICD-10-CM | POA: Diagnosis present

## 2015-05-31 DIAGNOSIS — D649 Anemia, unspecified: Secondary | ICD-10-CM | POA: Diagnosis present

## 2015-05-31 HISTORY — DX: Pneumonia, unspecified organism: J18.9

## 2015-05-31 LAB — COMPREHENSIVE METABOLIC PANEL
ALBUMIN: 2.5 g/dL — AB (ref 3.5–5.0)
ALT: 24 U/L (ref 14–54)
AST: 28 U/L (ref 15–41)
Alkaline Phosphatase: 60 U/L (ref 38–126)
Anion gap: 14 (ref 5–15)
BUN: 24 mg/dL — AB (ref 6–20)
CHLORIDE: 99 mmol/L — AB (ref 101–111)
CO2: 24 mmol/L (ref 22–32)
CREATININE: 1.5 mg/dL — AB (ref 0.44–1.00)
Calcium: 8.8 mg/dL — ABNORMAL LOW (ref 8.9–10.3)
GFR calc Af Amer: 40 mL/min — ABNORMAL LOW (ref >60)
GFR, EST NON AFRICAN AMERICAN: 35 mL/min — AB (ref >60)
GLUCOSE: 153 mg/dL — AB (ref 65–99)
Potassium: 3.7 mmol/L (ref 3.5–5.1)
SODIUM: 137 mmol/L (ref 135–145)
Total Bilirubin: 0.5 mg/dL (ref 0.3–1.2)
Total Protein: 5.4 g/dL — ABNORMAL LOW (ref 6.5–8.1)

## 2015-05-31 LAB — CBC WITH DIFFERENTIAL/PLATELET
BASOS PCT: 1 %
Basophils Absolute: 0.1 10*3/uL (ref 0.0–0.1)
EOS PCT: 3 %
Eosinophils Absolute: 0.2 10*3/uL (ref 0.0–0.7)
HEMATOCRIT: 34.9 % — AB (ref 36.0–46.0)
HEMOGLOBIN: 11.8 g/dL — AB (ref 12.0–15.0)
LYMPHS ABS: 2.6 10*3/uL (ref 0.7–4.0)
Lymphocytes Relative: 34 %
MCH: 29.5 pg (ref 26.0–34.0)
MCHC: 33.8 g/dL (ref 30.0–36.0)
MCV: 87.3 fL (ref 78.0–100.0)
MONO ABS: 0.6 10*3/uL (ref 0.1–1.0)
Monocytes Relative: 8 %
NEUTROS ABS: 4.1 10*3/uL (ref 1.7–7.7)
Neutrophils Relative %: 54 %
Platelets: 131 10*3/uL — ABNORMAL LOW (ref 150–400)
RBC: 4 MIL/uL (ref 3.87–5.11)
RDW: 14.3 % (ref 11.5–15.5)
WBC: 7.6 10*3/uL (ref 4.0–10.5)

## 2015-05-31 LAB — STREP PNEUMONIAE URINARY ANTIGEN: STREP PNEUMO URINARY ANTIGEN: NEGATIVE

## 2015-05-31 LAB — GLUCOSE, CAPILLARY
GLUCOSE-CAPILLARY: 196 mg/dL — AB (ref 65–99)
Glucose-Capillary: 236 mg/dL — ABNORMAL HIGH (ref 65–99)

## 2015-05-31 LAB — TSH: TSH: 4.687 u[IU]/mL — AB (ref 0.350–4.500)

## 2015-05-31 LAB — CBG MONITORING, ED
Glucose-Capillary: 133 mg/dL — ABNORMAL HIGH (ref 65–99)
Glucose-Capillary: 172 mg/dL — ABNORMAL HIGH (ref 65–99)

## 2015-05-31 LAB — INFLUENZA PANEL BY PCR (TYPE A & B)
H1N1 flu by pcr: NOT DETECTED
INFLAPCR: NEGATIVE
INFLBPCR: NEGATIVE

## 2015-05-31 LAB — PHOSPHORUS: Phosphorus: 4.2 mg/dL (ref 2.5–4.6)

## 2015-05-31 MED ORDER — PANTOPRAZOLE SODIUM 40 MG IV SOLR
40.0000 mg | INTRAVENOUS | Status: DC
  Filled 2015-05-31: qty 40

## 2015-05-31 MED ORDER — METOLAZONE 2.5 MG PO TABS
2.5000 mg | ORAL_TABLET | Freq: Every day | ORAL | Status: DC | PRN
  Filled 2015-05-31: qty 1

## 2015-05-31 MED ORDER — LEVALBUTEROL HCL 1.25 MG/0.5ML IN NEBU
1.2500 mg | INHALATION_SOLUTION | Freq: Four times a day (QID) | RESPIRATORY_TRACT | Status: DC
  Administered 2015-05-31 (×3): 1.25 mg via RESPIRATORY_TRACT
  Filled 2015-05-31 (×6): qty 0.5

## 2015-05-31 MED ORDER — ENOXAPARIN SODIUM 40 MG/0.4ML ~~LOC~~ SOLN: 40.0000 mg | SUBCUTANEOUS | Status: DC

## 2015-05-31 MED ORDER — LEVOFLOXACIN IN D5W 750 MG/150ML IV SOLN
750.0000 mg | INTRAVENOUS | Status: DC
  Filled 2015-05-31: qty 150

## 2015-05-31 MED ORDER — RIVAROXABAN 20 MG PO TABS
20.0000 mg | ORAL_TABLET | Freq: Every morning | ORAL | Status: DC
  Administered 2015-05-31 – 2015-06-02 (×3): 20 mg via ORAL
  Filled 2015-05-31 (×4): qty 1

## 2015-05-31 MED ORDER — PANTOPRAZOLE SODIUM 40 MG PO TBEC
40.0000 mg | DELAYED_RELEASE_TABLET | Freq: Every day | ORAL | Status: DC
  Administered 2015-05-31 – 2015-06-02 (×3): 40 mg via ORAL
  Filled 2015-05-31 (×3): qty 1

## 2015-05-31 MED ORDER — IPRATROPIUM BROMIDE 0.02 % IN SOLN: 0.5000 mg | Freq: Four times a day (QID) | RESPIRATORY_TRACT | Status: DC | PRN

## 2015-05-31 MED ORDER — ACETAMINOPHEN 325 MG PO TABS
650.0000 mg | ORAL_TABLET | Freq: Once | ORAL | Status: AC
  Administered 2015-05-31: 650 mg via ORAL
  Filled 2015-05-31: qty 2

## 2015-05-31 MED ORDER — OSELTAMIVIR PHOSPHATE 75 MG PO CAPS
75.0000 mg | ORAL_CAPSULE | Freq: Two times a day (BID) | ORAL | Status: DC
  Administered 2015-05-31: 75 mg via ORAL
  Filled 2015-05-31: qty 1

## 2015-05-31 MED ORDER — INSULIN DETEMIR 100 UNIT/ML ~~LOC~~ SOLN
50.0000 [IU] | Freq: Two times a day (BID) | SUBCUTANEOUS | Status: DC
  Administered 2015-05-31 – 2015-06-02 (×5): 50 [IU] via SUBCUTANEOUS
  Filled 2015-05-31 (×7): qty 0.5

## 2015-05-31 MED ORDER — LEVOFLOXACIN IN D5W 750 MG/150ML IV SOLN
750.0000 mg | INTRAVENOUS | Status: DC
  Administered 2015-05-31: 750 mg via INTRAVENOUS
  Filled 2015-05-31 (×2): qty 150

## 2015-05-31 MED ORDER — ONDANSETRON HCL 4 MG/2ML IJ SOLN: 4.0000 mg | Freq: Four times a day (QID) | INTRAMUSCULAR | Status: DC | PRN

## 2015-05-31 MED ORDER — GABAPENTIN 100 MG PO CAPS
100.0000 mg | ORAL_CAPSULE | Freq: Every day | ORAL | Status: DC
  Administered 2015-05-31 – 2015-06-01 (×2): 100 mg via ORAL
  Filled 2015-05-31 (×2): qty 1

## 2015-05-31 MED ORDER — SODIUM CHLORIDE 0.9% FLUSH
3.0000 mL | Freq: Two times a day (BID) | INTRAVENOUS | Status: DC
  Administered 2015-05-31 – 2015-06-02 (×5): 3 mL via INTRAVENOUS

## 2015-05-31 MED ORDER — ALPRAZOLAM 0.25 MG PO TABS
0.2500 mg | ORAL_TABLET | Freq: Three times a day (TID) | ORAL | Status: DC | PRN
  Administered 2015-05-31 – 2015-06-01 (×3): 0.25 mg via ORAL
  Filled 2015-05-31 (×3): qty 1

## 2015-05-31 MED ORDER — PANTOPRAZOLE SODIUM 40 MG IV SOLR
40.0000 mg | Freq: Once | INTRAVENOUS | Status: AC
  Administered 2015-05-31: 40 mg via INTRAVENOUS
  Filled 2015-05-31: qty 40

## 2015-05-31 MED ORDER — KETOROLAC TROMETHAMINE 15 MG/ML IJ SOLN
15.0000 mg | Freq: Once | INTRAMUSCULAR | Status: DC
  Filled 2015-05-31: qty 1

## 2015-05-31 MED ORDER — MAGNESIUM SULFATE 2 GM/50ML IV SOLN
2.0000 g | Freq: Once | INTRAVENOUS | Status: AC
  Administered 2015-05-31: 2 g via INTRAVENOUS
  Filled 2015-05-31: qty 50

## 2015-05-31 MED ORDER — INSULIN ASPART 100 UNIT/ML ~~LOC~~ SOLN
0.0000 [IU] | Freq: Three times a day (TID) | SUBCUTANEOUS | Status: DC
  Administered 2015-05-31: 3 [IU] via SUBCUTANEOUS
  Administered 2015-05-31: 4 [IU] via SUBCUTANEOUS
  Administered 2015-05-31 – 2015-06-01 (×2): 7 [IU] via SUBCUTANEOUS
  Administered 2015-06-01: 20 [IU] via SUBCUTANEOUS
  Administered 2015-06-02: 4 [IU] via SUBCUTANEOUS
  Filled 2015-05-31 (×2): qty 1

## 2015-05-31 MED ORDER — FUROSEMIDE 80 MG PO TABS
80.0000 mg | ORAL_TABLET | Freq: Two times a day (BID) | ORAL | Status: DC
  Administered 2015-05-31 – 2015-06-02 (×5): 80 mg via ORAL
  Filled 2015-05-31: qty 4
  Filled 2015-05-31 (×4): qty 1

## 2015-05-31 MED ORDER — METOPROLOL SUCCINATE ER 50 MG PO TB24
50.0000 mg | ORAL_TABLET | Freq: Every day | ORAL | Status: DC
  Administered 2015-05-31 – 2015-06-02 (×3): 50 mg via ORAL
  Filled 2015-05-31: qty 1
  Filled 2015-05-31: qty 2
  Filled 2015-05-31: qty 1

## 2015-05-31 MED ORDER — POTASSIUM CHLORIDE ER 10 MEQ PO TBCR
20.0000 meq | EXTENDED_RELEASE_TABLET | Freq: Every day | ORAL | Status: DC
  Administered 2015-05-31 – 2015-06-02 (×3): 20 meq via ORAL
  Filled 2015-05-31 (×8): qty 2

## 2015-05-31 MED ORDER — AMIODARONE HCL 200 MG PO TABS
200.0000 mg | ORAL_TABLET | Freq: Every day | ORAL | Status: DC
  Administered 2015-05-31 – 2015-06-02 (×3): 200 mg via ORAL
  Filled 2015-05-31 (×4): qty 1

## 2015-05-31 NOTE — ED Notes (Signed)
Pt requesting Xanax.  Explained that last dose was at 4am and am not able to dose any sooner. Pt agreeable to wait.

## 2015-05-31 NOTE — ED Provider Notes (Signed)
CSN: [id number]     Arrival date & time 05/30/15  1749 History   First MD Initiated Contact with Patient 05/30/15 2237     Chief Complaint  Patient presents with  . Pneumonia     (Consider location/radiation/quality/duration/timing/severity/associated sxs/prior Treatment) HPI Comments: Patient with history of diabetes, pneumonia, obesity presents with diagnosis of pneumonia. She started having sore throat 6 days ago which developed into a cough. Approximate 4 days ago she developed a fever. PCP wrote a prescription for azithromycin and Tamiflu. Patient has not improved on this therapy. She has worsening shortness of breath and weakness over the past several days. She was seen by her PCP today and had x-ray demonstrating pneumonia. She was sent to the emergency department for further evaluation and likely admission. Patient also has history of congestive heart failure and is on Lasix. She is on Xarelto. No chest pain, abdominal pain. No vomiting or diarrhea. No urinary symptoms. Onset of symptoms acute. Course is gradually worsening. Nothing makes symptoms better or worse.  Patient is a 70 y.o. female presenting with pneumonia. The history is provided by the patient.  Pneumonia Associated symptoms include coughing, a fever, myalgias and a sore throat. Pertinent negatives include no abdominal pain, chest pain, headaches, nausea, rash or vomiting.    Past Medical History  Diagnosis Date  . Persistent atrial fibrillation ([place])   . Morbid obesity ([place])   . Pneumonia 2011  . Diabetes mellitus   . HTN (hypertension)   . Abnormal cardiovascular function study     MYOVIEW SHOWING ANTERIOR WALL ATTENUATION  . High cholesterol   . Scoliosis   . Spinal stenosis   . Anxiety   . Osteoarthritis   . Osteoporosis   . Mild aortic sclerosis ([place])   . Degenerative disc disease   . Vitamin D deficiency   . CHF (congestive heart failure) ([place])   . Pneumonia   . GERD (gastroesophageal reflux disease)    . Lichen planus   . Anasarca    Past Surgical History  Procedure Laterality Date  . Finger fracture surgery    . Cardiac catheterization  11/16/09    SMOOTH AND NORMAL  . Total abdominal hysterectomy    . Laparoscopic cholecystectomy    . Bladder suspension    . Colonoscopy  08/14/08  . Cardioversion N/A 01/07/2015    Procedure: CARDIOVERSION;  Surgeon: Skeet Latch, MD;  Location: [facility];  Service: Cardiovascular;  Laterality: N/A;  . Cardioversion N/A 03/11/2015    Procedure: CARDIOVERSION;  Surgeon: Skeet Latch, MD;  Location: [facility];  Service: Cardiovascular;  Laterality: N/A;   Family History  Problem Relation Age of Onset  . Stroke Father   . Hypertension Father   . Atrial fibrillation Father     HAD MURMUR  . Heart failure Mother   . Hypertension Brother   . Hypertension Sister   . Hypertension Son   . Congestive Heart Failure Mother   . Hypertension Mother   . CAD Sister     EARLY  . CAD Brother     EARLY   Social History  Substance Use Topics  . Smoking status: Never Smoker   . Smokeless tobacco: Never Used  . Alcohol Use: No   OB History    No data available     Review of Systems  Constitutional: Positive for fever.  HENT: Positive for sore throat. Negative for rhinorrhea.   Eyes: Negative for redness.  Respiratory: Positive for cough and shortness of breath. Negative  for wheezing.   Cardiovascular: Negative for chest pain.  Gastrointestinal: Negative for nausea, vomiting, abdominal pain and diarrhea.  Genitourinary: Negative for dysuria.  Musculoskeletal: Positive for myalgias.  Skin: Negative for rash.  Neurological: Negative for headaches.    Allergies  Albuterol; Epinephrine; Penicillins; Bee venom; and Ivp dye  Home Medications   Prior to Admission medications   Medication Sig Start Date End Date Taking? Authorizing Provider  ALPRAZolam (XANAX) 0.25 MG tablet Take 0.125-0.25 mg by mouth 2 (two) times daily as needed  for anxiety.  03/26/14  Yes Historical Provider, MD  amiodarone (PACERONE) 200 MG tablet Take 1 tablet (200 mg total) by mouth daily. 03/11/15  Yes Skeet Latch, MD  azithromycin (ZITHROMAX) 250 MG tablet Take 250 mg by mouth daily. 05/27/15  Yes Historical Provider, MD  esomeprazole (NEXIUM) 40 MG capsule Take 40 mg by mouth daily as needed (for acid reflux).    Yes Historical Provider, MD  furosemide (LASIX) 80 MG tablet TAKE 1 TABLET (80 MG TOTAL) BY MOUTH 2 (TWO) TIMES DAILY. 04/11/15  Yes Thayer Headings, MD  gabapentin (NEURONTIN) 100 MG capsule Take 100 mg by mouth at bedtime.    Yes Historical Provider, MD  HUMALOG KWIKPEN 200 UNIT/ML SOPN Inject 6 Units into the skin 3 (three) times daily with meals. 12/13/14  Yes Historical Provider, MD  insulin detemir (LEVEMIR) 100 UNIT/ML injection Inject 50 Units into the skin 2 (two) times daily.   Yes Historical Provider, MD  KLOR-CON 10 10 MEQ tablet Take 20 mEq by mouth daily. 05/15/14  Yes Historical Provider, MD  losartan (COZAAR) 100 MG tablet TAKE 1 TABLET BY MOUTH EVERY DAY 04/11/15  Yes Thayer Headings, MD  metFORMIN (GLUCOPHAGE) 500 MG tablet Take 500-1,000 mg by mouth 2 (two) times daily with a meal. 1000 mg in the AM and 500mg  at night   Yes Historical Provider, MD  metolazone (ZAROXOLYN) 2.5 MG tablet Take 2.5 mg by mouth daily as needed (for shortness of breath).    Yes Historical Provider, MD  metoprolol succinate (TOPROL-XL) 50 MG 24 hr tablet Take 2 tablets by mouth in the morning and 1 tablet by mouth in the pm 02/06/15  Yes Thompson Grayer, MD  oseltamivir (TAMIFLU) 75 MG capsule Take 75 mg by mouth 2 (two) times daily. 05/27/15  Yes Historical Provider, MD  rivaroxaban (XARELTO) 20 MG TABS tablet Take 20 mg by mouth every morning.   Yes Historical Provider, MD  Vitamin D, Ergocalciferol, (DRISDOL) 50000 UNITS CAPS Take 50,000 Units by mouth every 7 (seven) days. Takes on Thursday   Yes Historical Provider, MD  XOPENEX HFA 45 MCG/ACT inhaler  Take 1 puff by mouth 4 (four) times daily as needed for wheezing.  11/28/14  Yes Historical Provider, MD  BD PEN NEEDLE NANO U/F 32G X 4 MM MISC Inject 1 each as directed See admin instructions. Use pen needles with insulin pens daily 12/24/14   Historical Provider, MD   BP 148/71 mmHg  Pulse 55  Temp(Src) 97.7 F (36.5 C) (Oral)  Resp 17  SpO2 100%   Physical Exam  Constitutional: She appears well-developed and well-nourished.  HENT:  Head: Normocephalic and atraumatic.  Mouth/Throat: Oropharynx is clear and moist.  Eyes: Conjunctivae are normal. Right eye exhibits no discharge. Left eye exhibits no discharge.  Neck: Normal range of motion. Neck supple.  Cardiovascular: Normal rate, regular rhythm and normal heart sounds.   No murmur heard. Pulmonary/Chest: Effort normal. No respiratory distress. She has no  wheezes. She has rales (crackles noted in the right upper lobe). She exhibits no tenderness.  Abdominal: Soft. There is no tenderness. There is no rebound and no guarding.  Musculoskeletal: She exhibits no edema or tenderness.  Neurological: She is alert.  Skin: Skin is warm and dry.  Psychiatric: She has a normal mood and affect.  Nursing note and vitals reviewed.   ED Course  Procedures (including critical care time) Labs Review Labs Reviewed  CBC WITH DIFFERENTIAL/PLATELET - Abnormal; Notable for the following:    Hemoglobin 11.6 (*)    HCT 35.7 (*)    All other components within normal limits  BASIC METABOLIC PANEL - Abnormal; Notable for the following:    Chloride 98 (*)    Glucose, Bld 141 (*)    BUN 25 (*)    Creatinine, Ser 1.62 (*)    GFR calc non Af Amer 32 (*)    GFR calc Af Amer 37 (*)    All other components within normal limits    Imaging Review Dg Chest 2 View  05/30/2015  CLINICAL DATA:  SOB x 2 weeks; mid chest pain and productive cough (green) x 5 days. Pt. Treated for flu 3 days ago. Evaluate for pneumonia. Hx of pneumonia, HTN - on meds, diabetic.  EXAM: CHEST  2 VIEW COMPARISON:  05/20/2010 FINDINGS: Heart size is normal. There is patchy infiltrate in the right upper lobe consistent with infectious infiltrate. Left lung is clear. No pulmonary edema. IMPRESSION: Right upper lobe infiltrate. Electronically Signed   By: Nolon Nations M.D.   On: 05/30/2015 19:52   I have personally reviewed and evaluated these images and lab results as part of my medical decision-making.    Patient seen and examined. Work-up initiated. Medications ordered.   Vital signs reviewed and are as follows: BP 148/71 mmHg  Pulse 55  Temp(Src) 97.7 F (36.5 C) (Oral)  Resp 17  SpO2 100%  ED ECG REPORT   Date: 05/31/2015  Rate: 59  Rhythm: sinus bradycardia  QRS Axis: normal  Intervals: Prolonged QTC  ST/T Wave abnormalities: normal  Conduction Disutrbances:none  Narrative Interpretation:   Old EKG Reviewed: unchanged  I have personally reviewed the EKG tracing and agree with the computerized printout as noted.  12:58 AM Given failure of outpatient antibiotics, worsening symptoms, and multiple risk factors, will admit. Spoke with Dr. Olevia Bowens of [facility] hospitalist who will admit.  Patient discussed with and seen by Dr. Betsey Holiday.   MDM   Final diagnoses:  Community acquired pneumonia   Admit for CAP, failure of outpt treatment.    Carlisle Cater, PA-C 05/31/15 0100  Orpah Greek, MD 05/31/15 [phone]

## 2015-05-31 NOTE — ED Notes (Signed)
Patient up to use restroom was unable to obtain urine specimen

## 2015-05-31 NOTE — ED Notes (Addendum)
Carb Mortfied diet ordered for lunch.

## 2015-05-31 NOTE — Progress Notes (Signed)
[facility] [id number] Admission Data: 05/31/2015 3:00 PM Attending Provider: Reubin Milan, MD  [id number] R, MD Consults/ Treatment Team:    Kristine [name] is a 70 y.o. female patient admitted from ED awake, alert  & orientated  X 3,  Full Code, VSS - Blood pressure 153/65, pulse 59, temperature 97.7 F (36.5 C), temperature source Oral, resp. rate 13, SpO2 97 %., O2  PRN  L nasal cannular, no c/o shortness of breath, no c/o chest pain, no distress noted. Tele # 14 to be placed.   IV site WDL:  SL.  Allergies:   Allergies  Allergen Reactions  . Albuterol Other (See Comments)    REACTION: Tachycardia- AFib  . Epinephrine Other (See Comments)    Increases heart rate per patient.   . Penicillins Other (See Comments)    REACTION: flushing \\T \ hot  . Bee Venom Other (See Comments)    "makes me nervous"  . Ivp Dye [Iodinated Diagnostic Agents] Other (See Comments)    flushing     Past Medical History  Diagnosis Date  . Persistent atrial fibrillation ([place])   . Morbid obesity ([place])   . Pneumonia 2011  . Diabetes mellitus   . HTN (hypertension)   . Abnormal cardiovascular function study     MYOVIEW SHOWING ANTERIOR WALL ATTENUATION  . High cholesterol   . Scoliosis   . Spinal stenosis   . Anxiety   . Osteoarthritis   . Osteoporosis   . Mild aortic sclerosis ([place])   . Degenerative disc disease   . Vitamin D deficiency   . CHF (congestive heart failure) ([place])   . Pneumonia   . GERD (gastroesophageal reflux disease)   . Lichen planus   . Anasarca     Pt orientation to unit, room and routine. Information packet given to patient/family and safety video watched.  Admission INP armband ID verified with patient/family, and in place. SR up x 2, fall risk assessment complete with Patient and family verbalizing understanding of risks associated with falls. Pt verbalizes an understanding of how to use the call bell and to call for help before getting out of bed.   Skin, clean-dry- intact without evidence of bruising, or skin tears.   No evidence of skin break down noted on exam.     Will cont to monitor and assist as needed.  Dayle Points, RN 05/31/2015 3:00 PM

## 2015-05-31 NOTE — ED Notes (Signed)
Patient CBG was 172 the Nurse was informed.

## 2015-05-31 NOTE — Progress Notes (Signed)
PROGRESS NOTE  Kristine [name] [id number] DOB: 05-Jan-1946 DOA: 05/30/2015 PCP: Tivis Ringer, MD Outpatient Specialists:    LOS: 0 days   Brief Narrative: 70 year old female with a past medical history of atrial fibrillation, status post cardiac conversion, diastolic CHF, GERD, morbid obesity, pneumonia, type 2 diabetes, hypertension, and  hyperlipidemia who was sent from her primary care office due to shortness of breath and treatment of pneumonia. Has been SOB over the last 3 weeks, but worsened 1 week ago when she developed fevers, body aches, headache, and productive cough. Completed 3 days of Tamiflu already and z-pak with no improvement. Chest x-ray confirmed RUL pneumonia. Started on IV levaquin.  Assessment & Plan: Principal Problem:   CAP (community acquired pneumonia) Active Problems:   Diabetes mellitus, type 2 (HCC)   Anxiety   Paroxysmal atrial fibrillation (HCC)   Anemia   Diastolic CHF, chronic ([place])   Community acquired pneumonia - chest x-ray showed RUL infiltrate, started on z-pak by PCP as outpatient but did not improve, treatment with IV levaquin started - continue respiratory support with bronchodilators, oxygen if needed, O2 sats have remained above 95% on RA - influenza swab negative, discontinue Tamiflu - legionella and strep pneumo urine antigen pending  Diastolic CHF, chronic - echo on 04/04/15 shows mild to moderate LVH with EF of 55-60% and grade 2 diastolic dysfunction  - likely contributing to shortness of breath along with the above, re-started on lasix 80 mg BID, which is the dose she is taking at home - no evidence of decompensation at this point - Resume her home Lasix - place on strict I&O, daily weights  Acute Kidney Injury on baseline chronic kidney disease, stage III - creatinine of 1.62 on admission, down-trending after IVF, likely related to dehydration - now resuming Lasix, and injury to monitor renal function - Baseline creatinine  around 1.5  Paroxysmal atrial fibrillation - cardioverted 03/11/15, currently in sinus bradycardia with rate of 55-60, continue to monitor - continue amiodarone and Xarelto - Monitor on telemetry  Diabetes mellitus, type 2 - blood glucose elevated between 140-150 in hospital - check Hgb A1C - continue home medications, may require adjustment if sugars remain elevated once pneumonia treated  Hypertension - controlled, continue home medications  Anemia of chronic disease - Hgb of 11.8, pt hemodynamically stable, continue to monitor  Anxiety -continue home alprazolam   DVT prophylaxis: Xarelto Code Status: Full code Family Communication: None at bedside Disposition Plan: Inpatient Barriers for discharge: IV antibiotics  Consultants:   None  Procedures:   None  Antimicrobials:  Levaquin  Subjective: States she no longer feels short of breath. Only complaints are of nasal congestion and fatigue due to lack of sleep this past night.  Objective: Filed Vitals:   05/31/15 0100 05/31/15 0748 05/31/15 0800 05/31/15 0949  BP: 162/64 156/51 126/66   Pulse:  58 54 55  Temp:  97.7 F (36.5 C)    TempSrc:  Oral    Resp: 16 20 18    SpO2:  97% 98%    No intake or output data in the 24 hours ending 05/31/15 0956 There were no vitals filed for this visit.  Examination: BP 126/66 mmHg  Pulse 55  Temp(Src) 97.7 F (36.5 C) (Oral)  Resp 18  SpO2 98% General exam: NAD Respiratory system: Wheezes present throughout all lung fields, no increased work of breathing. Cardiovascular system: Bradycardic, regular rhythm. No JVD. Trace peripheral edema.  Gastrointestinal system: Abdomen is nondistended, soft and nontender.  Central  nervous system: AxOx3. No focal deficits Extremities: No clubbing/cyanosis Skin: no rashes  Data Reviewed: I have personally reviewed following labs and imaging studies  CBC:  Recent Labs Lab 05/30/15 1919 05/31/15 0455  WBC 7.8 7.6    NEUTROABS 4.6 4.1  HGB 11.6* 11.8*  HCT 35.7* 34.9*  MCV 86.9 87.3  PLT 170 [id number]*   Basic Metabolic Panel:  Recent Labs Lab 05/30/15 1919 05/31/15 0455  NA 138 137  K 4.2 3.7  CL 98* 99*  CO2 27 24  GLUCOSE 141* 153*  BUN 25* 24*  CREATININE 1.62* 1.50*  CALCIUM 9.2 8.8*  PHOS  --  4.2   GFR: CrCl cannot be calculated (Unknown ideal weight.). Liver Function Tests:  Recent Labs Lab 05/31/15 0455  AST 28  ALT 24  ALKPHOS 60  BILITOT 0.5  PROT 5.4*  ALBUMIN 2.5*   CBG:  Recent Labs Lab 05/31/15 0729  GLUCAP 133*   Thyroid Function Tests:  Recent Labs  05/31/15 0455  TSH 4.687*   Urine analysis:    Component Value Date/Time   COLORURINE YELLOW 11/10/2013 2324   APPEARANCEUR CLOUDY* 11/10/2013 2324   LABSPEC 1.016 11/10/2013 2324   PHURINE 5.5 11/10/2013 2324   GLUCOSEU NEGATIVE 11/10/2013 2324   HGBUR TRACE* 11/10/2013 2324   BILIRUBINUR NEGATIVE 11/10/2013 2324   KETONESUR NEGATIVE 11/10/2013 2324   PROTEINUR NEGATIVE 11/10/2013 2324   UROBILINOGEN 1.0 11/10/2013 2324   NITRITE NEGATIVE 11/10/2013 2324   LEUKOCYTESUR NEGATIVE 11/10/2013 2324     Radiology Studies: Dg Chest 2 View  05/30/2015  CLINICAL DATA:  SOB x 2 weeks; mid chest pain and productive cough (green) x 5 days. Pt. Treated for flu 3 days ago. Evaluate for pneumonia. Hx of pneumonia, HTN - on meds, diabetic. EXAM: CHEST  2 VIEW COMPARISON:  05/20/2010 FINDINGS: Heart size is normal. There is patchy infiltrate in the right upper lobe consistent with infectious infiltrate. Left lung is clear. No pulmonary edema. IMPRESSION: Right upper lobe infiltrate. Electronically Signed   By: Nolon Nations M.D.   On: 05/30/2015 19:52     Scheduled Meds: . amiodarone  200 mg Oral Daily  . gabapentin  100 mg Oral QHS  . insulin aspart  0-20 Units Subcutaneous TID WC  . insulin detemir  50 Units Subcutaneous BID  . ketorolac  15 mg Intravenous Once  . levalbuterol  1.25 mg Nebulization 4  times per day  . metoprolol succinate  50 mg Oral Daily  . oseltamivir  75 mg Oral BID  . pantoprazole  40 mg Oral Daily  . potassium chloride  20 mEq Oral Daily  . rivaroxaban  20 mg Oral q morning - 10a  . sodium chloride flush  3 mL Intravenous Q12H   Continuous Infusions: . levofloxacin (LEVAQUIN) IV Stopped (05/31/15 [id number])    Tonye Pearson PA-S Marzetta Board, MD, PhD [facility] Pager [phone]  If 7PM-7AM, please contact night-coverage [email or web address] Password [facility] 05/31/2015, 9:56 AM

## 2015-05-31 NOTE — Progress Notes (Signed)
Responded to page from nurse to visit with patient per her request. 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  Mrs. Holstein just wanted to meet Chaplain and share her appreciation for staff and what we do. I listened as she shared stories of her work life and love for what she does. I prayed with patient per her  requested and provided ministry of presence and emotional support . Patient is  being admitted to unit later today. Chaplain services were offered if she needed further support.  Will follow as needed.    05/31/15 1200  Clinical Encounter Type  Visited With Patient;Patient not available;Health care provider  Visit Type Initial;Spiritual support  Referral From Nurse  Spiritual Encounters  Spiritual Needs Prayer;Emotional  Stress Factors  Patient Stress Factors None identified  Jaclynn Major, [place]

## 2015-05-31 NOTE — H&P (Signed)
[facility] History and Physical  Kristine [name] [id number] DOB: 03-14-1946 DOA: 05/30/2015  Referring physician: Carlisle Cater, PA-C PCP: Tivis Ringer, MD   Chief Complaint: Shortness of breath.  HPI: Kristine [name] is a 70 y.o. female with a past medical history of atrial fibrillation, status post cardiac conversion, diastolic CHF, GERD, morbid obesity, pneumonia, type 2 diabetes, hypertension, hyperlipidemia, scoliosis, spinal stenosis, anxiety, osteoarthritis, osteoporosis who comes to the emergency department due to shortness of breath referred by her PCP for treatment of pneumonia.  Per patient, she has been having mild symptoms of cough and dyspnea for the past 3 weeks. She states that on Friday she noticed that she had a sore throat. Next day, she started feeling some body aches and fatigue. On Sunday, she states that she developed fever, headache, worsening of myalgias and her cough became productive with greenish sputum. She states that she has coughed so much that she has lower chest wall and upper abdomen tenderness. Other symptoms that being nausea with severe dry heaves at times, but no vomiting, no diarrhea, no melena, no hematochezia. She denies genitourinary symptoms except for a stress incontinence when coughing.  When seen, the patient was in no acute distress, stated she felt a little better with treatment. Workup in the ER is significant for right upper lobe infiltrate and mildly elevated BUN/creatinine.  Review of Systems:  Constitutional:  Positive Fevers, chills, fatigue.  No weight loss, night sweats HEENT:  No headaches, Difficulty swallowing,Tooth/dental problems,Sore throat,  No sneezing, itching, ear ache, nasal congestion, post nasal drip,  Cardio-vascular:  No chest pain, Orthopnea, PND, swelling in lower extremities, anasarca, dizziness, palpitations  GI:  Positive loss of appetite, nausea. No heartburn, indigestion, abdominal pain,  vomiting, diarrhea, change in bowel habits Resp:  Positive dyspnea, productive cough of greenish sputum, wheezing. No hemoptysis. Skin:  no rash or lesions.  GU:  no dysuria, change in color of urine, no urgency or frequency. No flank pain.  Musculoskeletal:  Positive for arthralgias, myalgias and occasional back pain.  Psych:  No change in mood or affect. No depression or anxiety. No memory loss.   Past Medical History  Diagnosis Date  . Persistent atrial fibrillation ([place])   . Morbid obesity ([place])   . Pneumonia 2011  . Diabetes mellitus   . HTN (hypertension)   . Abnormal cardiovascular function study     MYOVIEW SHOWING ANTERIOR WALL ATTENUATION  . High cholesterol   . Scoliosis   . Spinal stenosis   . Anxiety   . Osteoarthritis   . Osteoporosis   . 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 Degenerative disc disease   . Vitamin D deficiency   . CHF (congestive heart failure) ([place])   . Pneumonia   . GERD (gastroesophageal reflux disease)   . Lichen planus   . Anasarca    Past Surgical History  Procedure Laterality Date  . Finger fracture surgery    . Cardiac catheterization  11/16/09    SMOOTH AND NORMAL  . Total abdominal hysterectomy    . Laparoscopic cholecystectomy    . Bladder suspension    . Colonoscopy  08/14/08  . Cardioversion N/A 01/07/2015    Procedure: CARDIOVERSION;  Surgeon: Skeet Latch, MD;  Location: [place];  Service: Cardiovascular;  Laterality: N/A;  . Cardioversion N/A 03/11/2015    Procedure: CARDIOVERSION;  Surgeon: Skeet Latch, MD;  Location: [place];  Service: Cardiovascular;  Laterality: N/A;   Social History:  reports that she has  never smoked. She has never used smokeless tobacco. She reports that she does not drink alcohol or use illicit drugs.  Allergies  Allergen Reactions  . Albuterol Other (See Comments)    REACTION: Tachycardia- AFib  . Epinephrine Other (See Comments)    Increases heart rate per patient.   .  Penicillins Other (See Comments)    REACTION: flushing \\T \ hot  . Bee Venom Other (See Comments)    "makes me nervous"  . Ivp Dye [Iodinated Diagnostic Agents] Other (See Comments)    flushing    Family History  Problem Relation Age of Onset  . Stroke Father   . Hypertension Father   . Atrial fibrillation Father     HAD MURMUR  . Heart failure Mother   . Hypertension Brother   . Hypertension Sister   . Hypertension Son   . Congestive Heart Failure Mother   . Hypertension Mother   . CAD Sister     EARLY  . CAD Brother     EARLY    Prior to Admission medications   Medication Sig Start Date End Date Taking? Authorizing Provider  ALPRAZolam (XANAX) 0.25 MG tablet Take 0.125-0.25 mg by mouth 2 (two) times daily as needed for anxiety.  03/26/14  Yes Historical Provider, MD  amiodarone (PACERONE) 200 MG tablet Take 1 tablet (200 mg total) by mouth daily. 03/11/15  Yes Skeet Latch, MD  azithromycin (ZITHROMAX) 250 MG tablet Take 250 mg by mouth daily. 05/27/15  Yes Historical Provider, MD  esomeprazole (NEXIUM) 40 MG capsule Take 40 mg by mouth daily as needed (for acid reflux).    Yes Historical Provider, MD  furosemide (LASIX) 80 MG tablet TAKE 1 TABLET (80 MG TOTAL) BY MOUTH 2 (TWO) TIMES DAILY. 04/11/15  Yes Thayer Headings, MD  gabapentin (NEURONTIN) 100 MG capsule Take 100 mg by mouth at bedtime.    Yes Historical Provider, MD  HUMALOG KWIKPEN 200 UNIT/ML SOPN Inject 6 Units into the skin 3 (three) times daily with meals. 12/13/14  Yes Historical Provider, MD  insulin detemir (LEVEMIR) 100 UNIT/ML injection Inject 50 Units into the skin 2 (two) times daily.   Yes Historical Provider, MD  KLOR-CON 10 10 MEQ tablet Take 20 mEq by mouth daily. 05/15/14  Yes Historical Provider, MD  losartan (COZAAR) 100 MG tablet TAKE 1 TABLET BY MOUTH EVERY DAY 04/11/15  Yes Thayer Headings, MD  metFORMIN (GLUCOPHAGE) 500 MG tablet Take 500-1,000 mg by mouth 2 (two) times daily with a meal. 1000 mg in  the AM and 500mg  at night   Yes Historical Provider, MD  metolazone (ZAROXOLYN) 2.5 MG tablet Take 2.5 mg by mouth daily as needed (for shortness of breath).    Yes Historical Provider, MD  metoprolol succinate (TOPROL-XL) 50 MG 24 hr tablet Take 2 tablets by mouth in the morning and 1 tablet by mouth in the pm 02/06/15  Yes Thompson Grayer, MD  oseltamivir (TAMIFLU) 75 MG capsule Take 75 mg by mouth 2 (two) times daily. 05/27/15  Yes Historical Provider, MD  rivaroxaban (XARELTO) 20 MG TABS tablet Take 20 mg by mouth every morning.   Yes Historical Provider, MD  Vitamin D, Ergocalciferol, (DRISDOL) 50000 UNITS CAPS Take 50,000 Units by mouth every 7 (seven) days. Takes on Thursday   Yes Historical Provider, MD  XOPENEX HFA 45 MCG/ACT inhaler Take 1 puff by mouth 4 (four) times daily as needed for wheezing.  11/28/14  Yes Historical Provider, MD  BD PEN NEEDLE  NANO U/F 32G X 4 MM MISC Inject 1 each as directed See admin instructions. Use pen needles with insulin pens daily 12/24/14   Historical Provider, MD   Physical Exam: Filed Vitals:   05/30/15 2300 05/31/15 0000 05/31/15 0030 05/31/15 0100  BP: 148/71 142/64 142/70 162/64  Pulse:      Temp:      TempSrc:      Resp: 17 17 18 16   SpO2:        Wt Readings from Last 3 Encounters:  04/03/15 116.302 kg (256 lb 6.4 oz)  03/20/15 118.026 kg (260 lb 3.2 oz)  03/11/15 117.028 kg (258 lb)    General:  Appears calm and comfortable Eyes: PERRL, normal lids, irises & conjunctiva ENT: grossly normal hearing, lips & tongue. Oral mucosa mildly dry. Neck: no LAD, masses or thyromegaly Cardiovascular: Bradycardic at 56 bpm, no m/r/g. No LE edema. Telemetry: Sinus bradycardia. Respiratory: RUL/RML rhonchi and crackles. Bilateral wheezing throughout.                      No accessory muscle use Abdomen: soft, ntnd Skin: no rash or induration seen on limited exam Musculoskeletal: grossly normal tone BUE/BLE Psychiatric: grossly normal mood and affect,  speech fluent and appropriate Neurologic: Awake, alert, oriented 3, grossly non-focal.          Labs on Admission:  Basic Metabolic Panel:  Recent Labs Lab 05/30/15 1919  NA 138  K 4.2  CL 98*  CO2 27  GLUCOSE 141*  BUN 25*  CREATININE 1.62*  CALCIUM 9.2   CBC:  Recent Labs Lab 05/30/15 1919  WBC 7.8  NEUTROABS 4.6  HGB 11.6*  HCT 35.7*  MCV 86.9  PLT 170    Radiological Exams on Admission: Dg Chest 2 View  05/30/2015  CLINICAL DATA:  SOB x 2 weeks; mid chest pain and productive cough (green) x 5 days. Pt. Treated for flu 3 days ago. Evaluate for pneumonia. Hx of pneumonia, HTN - on meds, diabetic. EXAM: CHEST  2 VIEW COMPARISON:  05/20/2010 FINDINGS: Heart size is normal. There is patchy infiltrate in the right upper lobe consistent with infectious infiltrate. Left lung is clear. No pulmonary edema. IMPRESSION: Right upper lobe infiltrate. Electronically Signed   By: Nolon Nations M.D.   On: 05/30/2015 19:52  Echocardiogram 04/04/2015 ------------------------------------------------------------------- LV EF: 55% - 60%  ------------------------------------------------------------------- History: PMH: Atrial fibrillation. Risk factors: Lifelong nonsmoker. No hypertension. Diabetes mellitus. Obese. Dyslipidemia.  ------------------------------------------------------------------- Study Conclusions  - Left ventricle: The cavity size was normal. Wall thickness was  increased increased in a pattern of mild to moderate LVH.  Systolic function was normal. The estimated ejection fraction was  in the range of 55% to 60%. Biplane LVEF 56.8% by speckle  tracking. Normal global longitudinal strain of -17.1%. Wall  motion was normal; there were no regional wall motion  abnormalities. Features are consistent with a pseudonormal left  ventricular filling pattern, with concomitant abnormal relaxation  and increased filling pressure (grade 2 diastolic  dysfunction). - Aortic valve: Trileaflet; mildly to moderately calcified  leaflets. - Mitral valve: Calcified annulus. There was mild regurgitation. - Left atrium: The atrium was mildly dilated. - Right ventricle: The cavity size was mildly dilated. - Right atrium: The atrium was at the upper limits of normal in  size. Central venous pressure (est): 3 mm Hg. - Atrial septum: No defect or patent foramen ovale was identified. - Tricuspid valve: There was trivial regurgitation. - Pulmonary arteries: Systolic  pressure could not be accurately  estimated. Incomplete tricuspid regurgitant Doppler envelope. - Pericardium, extracardiac: A trivial pericardial effusion was  identified.  Impressions:  - Mild to moderate LVH with LVEF 55-60% and grade 2 diastolic  dysfunction with increased filling pressures. Mild left atrial  enlargement. Mild MAC with mild mitral regurgitation. Sclerotic  aortic valve without stenosis. Mildly dilated RV with normal  contraction. Trivial tricuspid regurgitation, unable to  accurately assess PASP. Trivial pericardial effusion.   EKG: Independently reviewed.  Vent. rate 59 BPM PR interval 172 ms QRS duration 90 ms QT/QTc 506/500 ms P-R-T axes 68 110 49 Sinus bradycardia Right axis deviation Prolonged QT Abnormal ECG  Assessment/Plan Principal Problem:   CAP (community acquired pneumonia) Admit to telemetry a/inpatient. Continue supplemental oxygen. Start bronchodilators Levalbuterol/ipratropium. Continue IV Levaquin. Check a sputum Gram stain, culture and sensitivity. Follow-up blood cultures and sensitivity.  Active Problems:   Diabetes mellitus, type 2 (HCC) Carbohydrate or modify diet. CBG monitoring before meals Continue Levemir. CBG monitoring and regular insulin sliding scale.    Anxiety Continue alprazolam when necessary.    Paroxysmal atrial fibrillation (HCC) She is status post cardio conversion. Currently in sinus  bradycardia. Continue amiodarone and check TSH level in the morning. Continue anticoagulation.    Anemia Monitor H&H.    Diastolic CHF, chronic (HCC) Dyspnea likely from pneumonia and reactive airways. Will hold loop diuretic for the moment due to elevated BUN/creatinine.    Code Status: Full code. DVT Prophylaxis: The patient is taking Xarelto. Family Communication:  Disposition Plan: Admit for IV antibiotic therapy for 2-3 days.  Time spent: Over 70 minutes were spent during the process of this admission.  Reubin Milan, M.D. [facility] Pager [phone].

## 2015-06-01 LAB — GLUCOSE, CAPILLARY
GLUCOSE-CAPILLARY: 203 mg/dL — AB (ref 65–99)
GLUCOSE-CAPILLARY: 368 mg/dL — AB (ref 65–99)
Glucose-Capillary: 115 mg/dL — ABNORMAL HIGH (ref 65–99)

## 2015-06-01 LAB — BASIC METABOLIC PANEL
Anion gap: 12 (ref 5–15)
BUN: 25 mg/dL — ABNORMAL HIGH (ref 6–20)
CALCIUM: 8.8 mg/dL — AB (ref 8.9–10.3)
CO2: 25 mmol/L (ref 22–32)
CREATININE: 1.44 mg/dL — AB (ref 0.44–1.00)
Chloride: 99 mmol/L — ABNORMAL LOW (ref 101–111)
GFR calc non Af Amer: 36 mL/min — ABNORMAL LOW (ref >60)
GFR, EST AFRICAN AMERICAN: 42 mL/min — AB (ref >60)
Glucose, Bld: 249 mg/dL — ABNORMAL HIGH (ref 65–99)
Potassium: 4.1 mmol/L (ref 3.5–5.1)
SODIUM: 136 mmol/L (ref 135–145)

## 2015-06-01 LAB — CBC
HCT: 35.1 % — ABNORMAL LOW (ref 36.0–46.0)
Hemoglobin: 11.2 g/dL — ABNORMAL LOW (ref 12.0–15.0)
MCH: 28 pg (ref 26.0–34.0)
MCHC: 31.9 g/dL (ref 30.0–36.0)
MCV: 87.8 fL (ref 78.0–100.0)
Platelets: 188 10*3/uL (ref 150–400)
RBC: 4 MIL/uL (ref 3.87–5.11)
RDW: 14.4 % (ref 11.5–15.5)
WBC: 7.6 10*3/uL (ref 4.0–10.5)

## 2015-06-01 MED ORDER — LEVALBUTEROL HCL 1.25 MG/0.5ML IN NEBU: 1.2500 mg | INHALATION_SOLUTION | Freq: Four times a day (QID) | RESPIRATORY_TRACT | Status: DC | PRN

## 2015-06-01 MED ORDER — LEVOFLOXACIN IN D5W 750 MG/150ML IV SOLN
750.0000 mg | INTRAVENOUS | Status: DC
  Administered 2015-06-01: 750 mg via INTRAVENOUS
  Filled 2015-06-01 (×2): qty 150

## 2015-06-01 MED ORDER — LEVALBUTEROL HCL 1.25 MG/0.5ML IN NEBU
1.2500 mg | INHALATION_SOLUTION | Freq: Two times a day (BID) | RESPIRATORY_TRACT | Status: DC
  Administered 2015-06-01 (×2): 1.25 mg via RESPIRATORY_TRACT
  Filled 2015-06-01 (×2): qty 0.5

## 2015-06-01 NOTE — Progress Notes (Signed)
Patient respiratory assessment done and patient scored a 5. Changed from BID to PRN Xopenex 1.25mg . Patients has home regimen of PRN for wheezing and SOB per her memory. BBS heard clear and diminished in bases with right side slightly more diminished than left. No changes with treatments given. Continuing encouragement of IS use for lung issues that are unresolved form Bronchodilator use.

## 2015-06-01 NOTE — Progress Notes (Signed)
PROGRESS NOTE  Kristine [name] [id number] DOB: 15-Nov-1945 DOA: 05/30/2015 PCP: Tivis Ringer, MD Outpatient Specialists:    LOS: 1 day   Brief Narrative: 70 year old female with a past medical history of atrial fibrillation, status post cardiac conversion, diastolic CHF, GERD, morbid obesity, pneumonia, type 2 diabetes, hypertension, and  hyperlipidemia who was sent from her primary care office due to shortness of breath and treatment of pneumonia. Has been SOB over the last 3 weeks, but worsened 1 week ago when she developed fevers, body aches, headache, and productive cough. Completed 3 days of Tamiflu already and z-pak with no improvement. Chest x-ray confirmed RUL pneumonia. Started on IV levaquin.  Assessment & Plan: Principal Problem:   CAP (community acquired pneumonia) Active Problems:   Diabetes mellitus, type 2 (HCC)   Anxiety   Paroxysmal atrial fibrillation (HCC)   Anemia   Diastolic CHF, chronic ([place])   Community acquired pneumonia - chest x-ray showed RUL infiltrate, started on z-pak by PCP as outpatient but did not improve, treatment with IV levaquin started. Continue IV antibiotics today. - continue respiratory support with bronchodilators, oxygen if needed, O2 sats have remained above 95% on RA - influenza swab negative, discontinue Tamiflu - legionella and strep pneumo urine antigen pending  Diastolic CHF, chronic - echo on 04/04/15 shows mild to moderate LVH with EF of 55-60% and grade 2 diastolic dysfunction  - likely contributing to shortness of breath along with the above, re-started on lasix 80 mg BID, which is the dose she is taking at home - no evidence of decompensation at this point - Resumed her home Lasix - place on strict I&O, daily weights  Acute Kidney Injury on baseline chronic kidney disease, stage III - creatinine of 1.62 on admission, down-trending after IVF, likely related to dehydration - now resuming Lasix, and injury to monitor renal  function - Baseline creatinine around 1.5  Paroxysmal atrial fibrillation - cardioverted 03/11/15, currently in sinus bradycardia with rate of 55-60, continue to monitor - continue amiodarone and Xarelto - Monitor on telemetry  Diabetes mellitus, type 2 - blood glucose elevated between 140-150 in hospital - check Hgb A1C  Hypertension - controlled, continue home medications  Anemia of chronic disease - Hgb of 11.8, pt hemodynamically stable, continue to monitor  Anxiety -continue home alprazolam   DVT prophylaxis: Xarelto Code Status: Full code Family Communication: None at bedside Disposition Plan: Inpatient. Home when ready, likely one to 2 days Barriers for discharge: IV antibiotics  Consultants:   None  Procedures:   None  Antimicrobials:  Levaquin  Subjective: - she is feeling better, able to ambulate with less dyspnea today.  Objective: Filed Vitals:   05/31/15 2201 05/31/15 2254 06/01/15 0611 06/01/15 0923  BP: 126/61  142/49   Pulse: 60  59 56  Temp: 97.5 F (36.4 C)  97.9 F (36.6 C)   TempSrc: Axillary  Oral   Resp: 15  17 18   Height:      Weight:   116 kg (255 lb 11.7 oz)   SpO2: 98% 95% 95% 95%    Intake/Output Summary (Last 24 hours) at 06/01/15 1241 Last data filed at 06/01/15 0900  Gross per 24 hour  Intake    600 ml  Output    500 ml  Net    100 ml   Filed Weights   05/31/15 1501 06/01/15 0611  Weight: 114.76 kg (253 lb) 116 kg (255 lb 11.7 oz)    Examination: BP 142/49 mmHg  Pulse  56  Temp(Src) 97.9 F (36.6 C) (Oral)  Resp 18  Ht 5\' 8"  (1.727 m)  Wt 116 kg (255 lb 11.7 oz)  BMI 38.89 kg/m2  SpO2 95% General exam: NAD Respiratory system: wheezing resolved, no crackles, moves air well Cardiovascular system: Bradycardic, regular rhythm. No JVD. Trace peripheral edema.  Gastrointestinal system: Abdomen is nondistended, soft and nontender.  Central nervous system: AxOx3. No focal deficits Extremities: No  clubbing/cyanosis Skin: no rashes  Data Reviewed: I have personally reviewed following labs and imaging studies  CBC:  Recent Labs Lab 05/30/15 1919 05/31/15 0455 06/01/15 0613  WBC 7.8 7.6 7.6  NEUTROABS 4.6 4.1  --   HGB 11.6* 11.8* 11.2*  HCT 35.7* 34.9* 35.1*  MCV 86.9 87.3 87.8  PLT 170 131* 0000000   Basic Metabolic Panel:  Recent Labs Lab 05/30/15 1919 05/31/15 0455 06/01/15 0613  NA 138 137 136  K 4.2 3.7 4.1  CL 98* 99* 99*  CO2 27 24 25   GLUCOSE 141* 153* 249*  BUN 25* 24* 25*  CREATININE 1.62* 1.50* 1.44*  CALCIUM 9.2 8.8* 8.8*  PHOS  --  4.2  --    GFR: Estimated Creatinine Clearance: 50 mL/min (by C-G formula based on Cr of 1.44). Liver Function Tests:  Recent Labs Lab 05/31/15 0455  AST 28  ALT 24  ALKPHOS 60  BILITOT 0.5  PROT 5.4*  ALBUMIN 2.5*   CBG:  Recent Labs Lab 05/31/15 1153 05/31/15 1750 05/31/15 2155 06/01/15 0802 06/01/15 1138  GLUCAP 172* 236* 196* 203* 368*   Thyroid Function Tests:  Recent Labs  05/31/15 0455  TSH 4.687*   Urine analysis:    Component Value Date/Time   COLORURINE YELLOW 11/10/2013 2324   APPEARANCEUR CLOUDY* 11/10/2013 2324   LABSPEC 1.016 11/10/2013 2324   PHURINE 5.5 11/10/2013 2324   GLUCOSEU NEGATIVE 11/10/2013 2324   HGBUR TRACE* 11/10/2013 2324   BILIRUBINUR NEGATIVE 11/10/2013 2324   KETONESUR NEGATIVE 11/10/2013 2324   PROTEINUR NEGATIVE 11/10/2013 2324   UROBILINOGEN 1.0 11/10/2013 2324   NITRITE NEGATIVE 11/10/2013 2324   LEUKOCYTESUR NEGATIVE 11/10/2013 2324     Radiology Studies: Dg Chest 2 View  05/30/2015  CLINICAL DATA:  SOB x 2 weeks; mid chest pain and productive cough (green) x 5 days. Pt. Treated for flu 3 days ago. Evaluate for pneumonia. Hx of pneumonia, HTN - on meds, diabetic. EXAM: CHEST  2 VIEW COMPARISON:  05/20/2010 FINDINGS: Heart size is normal. There is patchy infiltrate in the right upper lobe consistent with infectious infiltrate. Left lung is clear. No  pulmonary edema. IMPRESSION: Right upper lobe infiltrate. Electronically Signed   By: Nolon Nations M.D.   On: 05/30/2015 19:52     Scheduled Meds: . amiodarone  200 mg Oral Daily  . furosemide  80 mg Oral BID  . gabapentin  100 mg Oral QHS  . insulin aspart  0-20 Units Subcutaneous TID WC  . insulin detemir  50 Units Subcutaneous BID  . ketorolac  15 mg Intravenous Once  . levalbuterol  1.25 mg Nebulization BID  . [START ON 06/02/2015] levofloxacin (LEVAQUIN) IV  750 mg Intravenous Q48H  . metoprolol succinate  50 mg Oral Daily  . pantoprazole  40 mg Oral Daily  . potassium chloride  20 mEq Oral Daily  . rivaroxaban  20 mg Oral q morning - 10a  . sodium chloride flush  3 mL Intravenous Q12H   Continuous Infusions:     Marzetta Board, MD, PhD [facility]  Pager [phone]  If 7PM-7AM, please contact night-coverage [email or web address] Password [facility] 06/01/2015, 12:41 PM

## 2015-06-01 NOTE — Progress Notes (Signed)
Patient is going off the unit to the gift shop for about 30 minutes

## 2015-06-02 LAB — GLUCOSE, CAPILLARY
GLUCOSE-CAPILLARY: 272 mg/dL — AB (ref 65–99)
Glucose-Capillary: 187 mg/dL — ABNORMAL HIGH (ref 65–99)

## 2015-06-02 MED ORDER — LEVOFLOXACIN 750 MG PO TABS: 750.0000 mg | ORAL_TABLET | Freq: Every day | ORAL | Status: DC

## 2015-06-02 NOTE — Progress Notes (Signed)
Kristine [name] to be D/C'd Home per MD order.  Discussed with the patient and all questions fully answered.  VSS, Skin clean, dry and intact without evidence of skin break down, no evidence of skin tears noted. IV catheter discontinued intact. Site without signs and symptoms of complications. Dressing and pressure applied.  An After Visit Summary was printed and given to the patient. Patient received prescription.  D/c education completed with patient/family including follow up instructions, medication list, d/c activities limitations if indicated, with other d/c instructions as indicated by MD - patient able to verbalize understanding, all questions fully answered.   Patient instructed to return to ED, call 911, or call MD for any changes in condition.   Patient escorted via [place], and D/C home via private auto.  [name] 06/02/2015 10:43 AM

## 2015-06-02 NOTE — Discharge Summary (Signed)
Physician Discharge Summary  Kristine [name] [id number] DOB: 02-20-1946 DOA: 05/30/2015  PCP: Tivis Ringer, MD  Admit date: 05/30/2015 Discharge date: 06/02/2015  Time spent: > 30 minutes  Recommendations for Outpatient Follow-up:  1. Follow up with PCP as needed 2. Cardiology follow up as scheduled   Discharge Diagnoses:  Principal Problem:   CAP (community acquired pneumonia) Active Problems:   Diabetes mellitus, type 2 (HCC)   Anxiety   Paroxysmal atrial fibrillation (HCC)   Anemia   Diastolic CHF, chronic ([place])  Discharge Condition: stable  Diet recommendation: heart healthy  Filed Weights   05/31/15 1501 06/01/15 0611 06/02/15 0524  Weight: 114.76 kg (253 lb) 116 kg (255 lb 11.7 oz) 118.8 kg (261 lb 14.5 oz)    History of present illness:  See H&P, Labs, Consult and Test reports for all details in brief, patient is a 70 year old female with a past medical history of atrial fibrillation, status post cardiac conversion, diastolic CHF, GERD, morbid obesity, pneumonia, type 2 diabetes, hypertension, and hyperlipidemia who was sent from her primary care office due to shortness of breath and treatment of pneumonia. Has been SOB over the last 3 weeks, but worsened 1 week ago when she developed fevers, body aches, headache, and productive cough. Completed 3 days of Tamiflu already and z-pak with no improvement. Chest x-ray confirmed RUL pneumonia. Started on IV levaquin.  Hospital Course:  Community acquired pneumonia - chest x-ray showed RUL infiltrate, started on z-pak by PCP as outpatient but did not improve, treatment with IV levaquin started in the ED, with subsequent clinical improvement, she was on room air, stable and ambulatory in the hospital, afebrile, and was discharged home in stable condition to finish her treatment with by mouth Levaquin. Her influenza testing was negative, her Tamiflu was discontinued. Diastolic CHF, chronic - echo on 04/04/15 shows mild to  moderate LVH with EF of 55-60% and grade 2 diastolic dysfunction. No evidence of decompensation, her home Lasix was continued. Acute Kidney Injury on baseline chronic kidney disease, stage III - creatinine of 1.62 on admission, improving and stable Paroxysmal atrial fibrillation - cardioverted 03/11/15, currently in sinus bradycardia with rate of 55-60, continue to monitor. Continue amiodarone and Xarelto. She is outpatient follow-up with cardiology already arranged. Diabetes mellitus, type 2 - resume home medications Hypertension - controlled, continue home medications Anemia of chronic disease - Hgb of 11.8, pt hemodynamically stable, continue to monitor Anxiety - continue home alprazolam  Procedures:  None    Consultations:  None   Discharge Exam: Filed Vitals:   06/01/15 2034 06/01/15 2303 06/02/15 0524 06/02/15 0524  BP:  157/53  146/48  Pulse:  58  54  Temp:  98.2 F (36.8 C)  97.5 F (36.4 C)  TempSrc:  Oral  Oral  Resp:    18  Height:      Weight:   118.8 kg (261 lb 14.5 oz)   SpO2: 96% 98%  96%    General: NAD Cardiovascular: RRR Respiratory: CTA biL  Discharge Instructions Activity:  As tolerated   Get Medicines reviewed and adjusted: Please take all your medications with you for your next visit with your Primary MD  Please request your Primary MD to go over all hospital tests and procedure/radiological results at the follow up, please ask your Primary MD to get all Hospital records sent to his/her office.  If you experience worsening of your admission symptoms, develop shortness of breath, life threatening emergency, suicidal or homicidal thoughts you must  seek medical attention immediately by calling 911 or calling your MD immediately if symptoms less severe.  You must read complete instructions/literature along with all the possible adverse reactions/side effects for all the Medicines you take and that have been prescribed to you. Take any new Medicines  after you have completely understood and accpet all the possible adverse reactions/side effects.   Do not drive when taking Pain medications.   Do not take more than prescribed Pain, Sleep and Anxiety Medications  Special Instructions: If you have smoked or chewed Tobacco in the last 2 yrs please stop smoking, stop any regular Alcohol and or any Recreational drug use.  Wear Seat belts while driving.  Please note  You were cared for by a hospitalist during your hospital stay. Once you are discharged, your primary care physician will handle any further medical issues. Please note that NO REFILLS for any discharge medications will be authorized once you are discharged, as it is imperative that you return to your primary care physician (or establish a relationship with a primary care physician if you do not have one) for your aftercare needs so that they can reassess your need for medications and monitor your lab values.    Medication List    STOP taking these medications        oseltamivir 75 MG capsule  Commonly known as:  TAMIFLU      TAKE these medications        ALPRAZolam 0.25 MG tablet  Commonly known as:  XANAX  Take 0.125-0.25 mg by mouth 2 (two) times daily as needed for anxiety.     amiodarone 200 MG tablet  Commonly known as:  PACERONE  Take 1 tablet (200 mg total) by mouth daily.     azithromycin 250 MG tablet  Commonly known as:  ZITHROMAX  Take 250 mg by mouth daily.     BD PEN NEEDLE NANO U/F 32G X 4 MM Misc  Generic drug:  Insulin Pen Needle  Inject 1 each as directed See admin instructions. Use pen needles with insulin pens daily     esomeprazole 40 MG capsule  Commonly known as:  NEXIUM  Take 40 mg by mouth daily as needed (for acid reflux).     furosemide 80 MG tablet  Commonly known as:  LASIX  TAKE 1 TABLET (80 MG TOTAL) BY MOUTH 2 (TWO) TIMES DAILY.     gabapentin 100 MG capsule  Commonly known as:  NEURONTIN  Take 100 mg by mouth at bedtime.      HUMALOG KWIKPEN 200 UNIT/ML Sopn  Generic drug:  Insulin Lispro  Inject 6 Units into the skin 3 (three) times daily with meals.     insulin detemir 100 UNIT/ML injection  Commonly known as:  LEVEMIR  Inject 50 Units into the skin 2 (two) times daily.     KLOR-CON 10 10 MEQ tablet  Generic drug:  potassium chloride  Take 20 mEq by mouth daily.     levofloxacin 750 MG tablet  Commonly known as:  LEVAQUIN  Take 1 tablet (750 mg total) by mouth daily.     losartan 100 MG tablet  Commonly known as:  COZAAR  TAKE 1 TABLET BY MOUTH EVERY DAY     metFORMIN 500 MG tablet  Commonly known as:  GLUCOPHAGE  Take 500-1,000 mg by mouth 2 (two) times daily with a meal. 1000 mg in the AM and 500mg  at night     metolazone 2.5 MG tablet  Commonly known as:  ZAROXOLYN  Take 2.5 mg by mouth daily as needed (for shortness of breath).     metoprolol succinate 50 MG 24 hr tablet  Commonly known as:  TOPROL-XL  Take 2 tablets by mouth in the morning and 1 tablet by mouth in the pm     rivaroxaban 20 MG Tabs tablet  Commonly known as:  XARELTO  Take 20 mg by mouth every morning.     Vitamin D (Ergocalciferol) 50000 units Caps capsule  Commonly known as:  DRISDOL  Take 50,000 Units by mouth every 7 (seven) days. Takes on Thursday     XOPENEX HFA 45 MCG/ACT inhaler  Generic drug:  levalbuterol  Take 1 puff by mouth 4 (four) times daily as needed for wheezing.           Follow-up Information    Follow up with Tivis Ringer, MD.   Specialty:  Internal Medicine   Why:  As needed, If symptoms worsen   Contact information:   [place] [id number] [phone]       The results of significant diagnostics from this hospitalization (including imaging, microbiology, ancillary and laboratory) are listed below for reference.    Significant Diagnostic Studies: Dg Chest 2 View  05/30/2015  CLINICAL DATA:  SOB x 2 weeks; mid chest pain and productive cough (green) x 5  days. Pt. Treated for flu 3 days ago. Evaluate for pneumonia. Hx of pneumonia, HTN - on meds, diabetic. EXAM: CHEST  2 VIEW COMPARISON:  05/20/2010 FINDINGS: Heart size is normal. There is patchy infiltrate in the right upper lobe consistent with infectious infiltrate. Left lung is clear. No pulmonary edema. IMPRESSION: Right upper lobe infiltrate. Electronically Signed   By: Nolon Nations M.D.   On: 05/30/2015 19:52    Microbiology: Recent Results (from the past 240 hour(s))  Culture, blood (routine x 2) Call MD if unable to obtain prior to antibiotics being given     Status: None (Preliminary result)   Collection Time: 05/31/15  2:45 AM  Result Value Ref Range Status   Specimen Description BLOOD RIGHT ANTECUBITAL  Final   Special Requests IN PEDIATRIC BOTTLE 1CC  Final   Culture NO GROWTH 2 DAYS  Final   Report Status PENDING  Incomplete  Culture, blood (routine x 2) Call MD if unable to obtain prior to antibiotics being given     Status: None (Preliminary result)   Collection Time: 05/31/15  2:53 AM  Result Value Ref Range Status   Specimen Description BLOOD RIGHT HAND  Final   Special Requests BOTTLES DRAWN AEROBIC ONLY [place]  Final   Culture NO GROWTH 2 DAYS  Final   Report Status PENDING  Incomplete     Labs: Basic Metabolic Panel:  Recent Labs Lab 05/30/15 1919 05/31/15 0455 06/01/15 0613  NA 138 137 136  K 4.2 3.7 4.1  CL 98* 99* 99*  CO2 27 24 25   GLUCOSE 141* 153* 249*  BUN 25* 24* 25*  CREATININE 1.62* 1.50* 1.44*  CALCIUM 9.2 8.8* 8.8*  PHOS  --  4.2  --    Liver Function Tests:  Recent Labs Lab 05/31/15 0455  AST 28  ALT 24  ALKPHOS 60  BILITOT 0.5  PROT 5.4*  ALBUMIN 2.5*   CBC:  Recent Labs Lab 05/30/15 1919 05/31/15 0455 06/01/15 0613  WBC 7.8 7.6 7.6  NEUTROABS 4.6 4.1  --   HGB 11.6* 11.8* 11.2*  HCT 35.7* 34.9* 35.1*  MCV 86.9 87.3 87.8  PLT 170 131* 188    CBG:  Recent Labs Lab 06/01/15 0802 06/01/15 1138 06/01/15 1644  06/01/15 2304 06/02/15 0803  GLUCAP 203* 368* 115* 272* 187*       Signed:  Shaeley Segall  [facility] 06/02/2015, 1:54 PM

## 2015-06-02 NOTE — Discharge Instructions (Signed)
Follow with Tivis Ringer, MD in 5-7 days  Please get a complete blood count and chemistry panel checked by your Primary MD at your next visit, and again as instructed by your Primary MD. Please get your medications reviewed and adjusted by your Primary MD.  Please request your Primary MD to go over all Hospital Tests and Procedure/Radiological results at the follow up, please get all Hospital records sent to your Prim MD by signing hospital release before you go home.  If you had Pneumonia of Lung problems at the Hospital: Please get a 2 view Chest X ray done in 6-8 weeks after hospital discharge or sooner if instructed by your Primary MD.  If you have Congestive Heart Failure: Please call your Cardiologist or Primary MD anytime you have any of the following symptoms:  1) 3 pound weight gain in 24 hours or 5 pounds in 1 week  2) shortness of breath, with or without a dry hacking cough  3) swelling in the hands, feet or stomach  4) if you have to sleep on extra pillows at night in order to breathe  Follow cardiac low salt diet and 1.5 lit/day fluid restriction.  If you have diabetes Accuchecks 4 times/day, Once in AM empty stomach and then before each meal. Log in all results and show them to your primary doctor at your next visit. If any glucose reading is under 80 or above 300 call your primary MD immediately.  If you have Seizure/Convulsions/Epilepsy: Please do not drive, operate heavy machinery, participate in activities at heights or participate in high speed sports until you have seen by Primary MD or a Neurologist and advised to do so again.  If you had Gastrointestinal Bleeding: Please ask your Primary MD to check a complete blood count within one week of discharge or at your next visit. Your endoscopic/colonoscopic biopsies that are pending at the time of discharge, will also need to followed by your Primary MD.  Get Medicines reviewed and adjusted. Please take all your  medications with you for your next visit with your Primary MD  Please request your Primary MD to go over all hospital tests and procedure/radiological results at the follow up, please ask your Primary MD to get all Hospital records sent to his/her office.  If you experience worsening of your admission symptoms, develop shortness of breath, life threatening emergency, suicidal or homicidal thoughts you must seek medical attention immediately by calling 911 or calling your MD immediately  if symptoms less severe.  You must read complete instructions/literature along with all the possible adverse reactions/side effects for all the Medicines you take and that have been prescribed to you. Take any new Medicines after you have completely understood and accpet all the possible adverse reactions/side effects.   Do not drive or operate heavy machinery when taking Pain medications.   Do not take more than prescribed Pain, Sleep and Anxiety Medications  Special Instructions: If you have smoked or chewed Tobacco  in the last 2 yrs please stop smoking, stop any regular Alcohol  and or any Recreational drug use.  Wear Seat belts while driving.  Please note You were cared for by a hospitalist during your hospital stay. If you have any questions about your discharge medications or the care you received while you were in the hospital after you are discharged, you can call the unit and asked to speak with the hospitalist on call if the hospitalist that took care of you is not available. Once  you are discharged, your primary care physician will handle any further medical issues. Please note that NO REFILLS for any discharge medications will be authorized once you are discharged, as it is imperative that you return to your primary care physician (or establish a relationship with a primary care physician if you do not have one) for your aftercare needs so that they can reassess your need for medications and monitor your  lab values.  You can reach the hospitalist office at phone [phone] or fax [phone]   If you do not have a primary care physician, you can call [phone] for a physician referral.  Activity: As tolerated with Full fall precautions use walker/cane & assistance as needed  Diet: heart healthy  Disposition Home  Information on my medicine - XARELTO (Rivaroxaban)  This medication education was reviewed with me or my healthcare representative as part of my discharge preparation.  The pharmacist that spoke with me during my hospital stay was:  Duayne Cal, [facility]  Why was Xarelto prescribed for you? Xarelto was prescribed for you to reduce the risk of a blood clot forming that can cause a stroke if you have a medical condition called atrial fibrillation (a type of irregular heartbeat).  What do you need to know about xarelto ? Take your Xarelto ONCE DAILY at the same time every day with your evening meal. If you have difficulty swallowing the tablet whole, you may crush it and mix in applesauce just prior to taking your dose.  Take Xarelto exactly as prescribed by your doctor and DO NOT stop taking Xarelto without talking to the doctor who prescribed the medication.  Stopping without other stroke prevention medication to take the place of Xarelto may increase your risk of developing a clot that causes a stroke.  Refill your prescription before you run out.  After discharge, you should have regular check-up appointments with your healthcare provider that is prescribing your Xarelto.  In the future your dose may need to be changed if your kidney function or weight changes by a significant amount.  What do you do if you miss a dose? If you are taking Xarelto ONCE DAILY and you miss a dose, take it as soon as you remember on the same day then continue your regularly scheduled once daily regimen the next day. Do not take two doses of Xarelto at the same time or on the same day.    Important Safety Information A possible side effect of Xarelto is bleeding. You should call your healthcare provider right away if you experience any of the following: ? Bleeding from an injury or your nose that does not stop. ? Unusual colored urine (red or dark brown) or unusual colored stools (red or black). ? Unusual bruising for unknown reasons. ? A serious fall or if you hit your head (even if there is no bleeding).  Some medicines may interact with Xarelto and might increase your risk of bleeding while on Xarelto. To help avoid this, consult your healthcare provider or pharmacist prior to using any new prescription or non-prescription medications, including herbals, vitamins, non-steroidal anti-inflammatory drugs (NSAIDs) and supplements.  This website has more information on Xarelto: [email or web address]

## 2015-06-03 LAB — HEMOGLOBIN A1C
Hgb A1c MFr Bld: 8.1 % — ABNORMAL HIGH (ref 4.8–5.6)
Mean Plasma Glucose: 186 mg/dL

## 2015-06-03 LAB — LEGIONELLA ANTIGEN, URINE

## 2015-06-05 LAB — CULTURE, BLOOD (ROUTINE X 2)
CULTURE: NO GROWTH
Culture: NO GROWTH

## 2015-06-18 ENCOUNTER — Ambulatory Visit (HOSPITAL_COMMUNITY): Payer: Medicare HMO | Admitting: Nurse Practitioner

## 2015-06-18 ENCOUNTER — Ambulatory Visit (INDEPENDENT_AMBULATORY_CARE_PROVIDER_SITE_OTHER): Payer: Medicare HMO | Admitting: Cardiovascular Disease

## 2015-06-18 ENCOUNTER — Encounter: Payer: Self-pay | Admitting: Cardiovascular Disease

## 2015-06-18 VITALS — BP 128/100 | HR 100 | Ht 69.0 in | Wt 264.0 lb

## 2015-06-18 DIAGNOSIS — I5032 Chronic diastolic (congestive) heart failure: Secondary | ICD-10-CM

## 2015-06-18 DIAGNOSIS — I480 Paroxysmal atrial fibrillation: Secondary | ICD-10-CM

## 2015-06-18 DIAGNOSIS — I481 Persistent atrial fibrillation: Secondary | ICD-10-CM

## 2015-06-18 DIAGNOSIS — I4819 Other persistent atrial fibrillation: Secondary | ICD-10-CM

## 2015-06-18 NOTE — Progress Notes (Signed)
Cardiology Office Note   Date:  06/18/2015   ID:  [name], DOB [birth year], MRN [id number]  PCP:  Tivis Ringer, MD  Cardiologist:   Thayer Headings, MD   Chief Complaint  Patient presents with  . Follow-up    atrial fib  . Congestive Heart Failure   1. Atrial fibrillation 2. Diabetes mellitus 3. Chronic diastolic CHF 4. Obesity.   History of Present Illness:  Kristine [name] is a 70 year old female with a history of atrial fibrillation. She has done well since I last saw her. She's not had any episodes of chest pain or shortness breath. Her blood pressure readings have been normal.  She is tolerating the Xarelto fairly well. She has some occasional bleeding from the gums. She enjoys not having to check her INR levels.   She has occasional palpitations and heart racing. This typically can rest and these will resolve. She is able to wak about 10 minutes at a time.   Oct. 24, 2014:  June 23, 2013:  Cherysh's BP is elevated. She had a recent echocardiogram that revealed a left ventricle systolic function that was at the lower limits of normal-50%. Study Conclusions  - Left ventricle: The cavity size was normal. Wall thickness was increased in a pattern of mild LVH. The estimated ejection fraction was 50%. Diffuse hypokinesis. Features are consistent with a pseudonormal left ventricular filling pattern, with concomitant abnormal relaxation and increased filling pressure (grade 2 diastolic dysfunction). - Aortic valve: There was no stenosis. - Mitral valve: Mildly calcified annulus. Mildly calcified leaflets . Trivial regurgitation. - Left atrium: The atrium was moderately dilated. - Right ventricle: The cavity size was normal. Systolic function was mildly reduced. - Right atrium: The atrium was moderately dilated. - Systemic veins: IVC measured 1.7 cm with < 50% respirophasic variation, suggesting RA pressure 8 mmHg. - Pericardium, extracardiac: A trivial  pericardial effusion was identified posterior to the heart  She was having some abdominal pain and   September 29, 2013:  Kristine [name] is doing well. Stressed about coming here.   Jan. 7, 2015:  Kristine [name] is a 70 yo with hx of Afib and diastolic dysfunction She converted to NSR in the hospital with flecainide. She was tried on [place] but her QT interval prolonged. Was changed to Flecainide and has maintained NSR.  She is exercising regularly - 30 minutes a day. Is on Lasix 80 mg a day and mtalazone 2. 5 once a week. Is still retaining fluid. Trying to avoid salt in food.    June 11, 2014:   Kristine [name] is a 70 y.o. female who presents for follow-up of her atrial fibrillation. She has had some issues with fluid retention and  has been on metalazone.   Sept. 19, 2016:  Doing well.  Has had some shortness of breath - improved with increasing lasix to 80 BID .  BP is still high this am.  BP at home has been normal .  June 18, 2015:  She has tried [place] but had prolonged QT - so it was stopped Tried on flecainide Was tried on amiodarone  Had a cardioversion in Dec.   Amiodarone dose was lowered to 200 mg a day and she is now back in atrial fib She is not as symptomatic today  She is very fatigued. Has not been sleeping well at all Has lots of arm fatigue in her arms with any activity   Past Medical History  Diagnosis Date  . Persistent atrial  fibrillation (Kristine [name])   . Morbid obesity ([place])   . HTN (hypertension)   . Abnormal cardiovascular function study     MYOVIEW SHOWING ANTERIOR WALL ATTENUATION  . High cholesterol   . Scoliosis   . Spinal stenosis   . Anxiety   . Osteoporosis   . Mild aortic sclerosis ([place])   . Vitamin D deficiency   . CHF (congestive heart failure) ([place])   . GERD (gastroesophageal reflux disease)   . Lichen planus   . Anasarca   . Asthma   . Pneumonia   . CAP (community acquired pneumonia)     "this makes 5 times in 10 years" (05/31/2015)  .  Chronic bronchitis ([place])   . Type II diabetes mellitus ([place])   . History of hiatal hernia   . Osteoarthritis   . Degenerative disc disease   . Arthritis     "back" (05/31/2015)  . Chronic back pain     Past Surgical History  Procedure Laterality Date  . Finger fracture surgery Right 1970s    "ring finger"  . Bladder suspension  1980s  . Colonoscopy  08/14/08  . Cardioversion N/A 01/07/2015    Procedure: CARDIOVERSION;  Surgeon: Skeet Latch, MD;  Location: [place];  Service: Cardiovascular;  Laterality: N/A;  . Cardioversion N/A 03/11/2015    Procedure: CARDIOVERSION;  Surgeon: Skeet Latch, MD;  Location: [facility] ENDOSCOPY;  Service: Cardiovascular;  Laterality: N/A;  . Fracture surgery    . Laparoscopic cholecystectomy  1980s  . Carpal tunnel release Left 1970s  . Vaginal hysterectomy  1980  . Tubal ligation    . Cardiac catheterization  11/16/09    SMOOTH AND NORMAL  . Cataract extraction w/ intraocular lens  implant, bilateral Bilateral ~ 2010     Current Outpatient Prescriptions  Medication Sig Dispense Refill  . ALPRAZolam (XANAX) 0.25 MG tablet Take 0.125-0.25 mg by mouth 2 (two) times daily as needed for anxiety.   0  . amiodarone (PACERONE) 200 MG tablet Take 1 tablet (200 mg total) by mouth daily. 180 tablet 3  . BD PEN NEEDLE NANO U/F 32G X 4 MM MISC Inject 1 each as directed See admin instructions. Use pen needles with insulin pens daily  11  . esomeprazole (NEXIUM) 40 MG capsule Take 40 mg by mouth daily as needed (for acid reflux).     . furosemide (LASIX) 80 MG tablet TAKE 1 TABLET (80 MG TOTAL) BY MOUTH 2 (TWO) TIMES DAILY. 180 tablet 3  . gabapentin (NEURONTIN) 100 MG capsule Take 100 mg by mouth at bedtime.     Marland Kitchen HUMALOG KWIKPEN 200 UNIT/ML SOPN Inject 6 Units into the skin 3 (three) times daily with meals.  6  . insulin detemir (LEVEMIR) 100 UNIT/ML injection Inject 50 Units into the skin 2 (two) times daily.    Marland Kitchen KLOR-CON 10 10 MEQ tablet Take 20 mEq  by mouth daily.  11  . losartan (COZAAR) 100 MG tablet TAKE 1 TABLET BY MOUTH EVERY DAY 90 tablet 3  . metFORMIN (GLUCOPHAGE) 500 MG tablet Take 500-1,000 mg by mouth 2 (two) times daily with a meal. 1000 mg in the AM and 500mg  at night    . metolazone (ZAROXOLYN) 2.5 MG tablet Take 2.5 mg by mouth daily as needed (for shortness of breath).     . metoprolol succinate (TOPROL-XL) 50 MG 24 hr tablet Take 2 tablets by mouth in the morning and 1 tablet by mouth in the pm 270 tablet 3  .  rivaroxaban (XARELTO) 20 MG TABS tablet Take 20 mg by mouth every morning.    . Vitamin D, Ergocalciferol, (DRISDOL) 50000 UNITS CAPS Take 50,000 Units by mouth every 7 (seven) days. Takes on Thursday    . XOPENEX HFA 45 MCG/ACT inhaler Take 1 puff by mouth 4 (four) times daily as needed for wheezing.   6   No current facility-administered medications for this visit.    Allergies:   Albuterol; Epinephrine; Penicillins; Bee venom; and Ivp dye    Social History:  The patient  reports that she has never smoked. She has never used smokeless tobacco. She reports that she does not drink alcohol or use illicit drugs.   Family History:  The patient's family history includes Atrial fibrillation in her father; CAD in her brother and sister; Congestive Heart Failure in her mother; Heart failure in her mother; Hypertension in her brother, father, mother, sister, and son; Stroke in her father.    ROS:  Please see the history of present illness.    Review of Systems: Constitutional:  denies fever, chills, diaphoresis, appetite change and fatigue.  HEENT: denies photophobia, eye pain, redness, hearing loss, ear pain, congestion, sore throat, rhinorrhea, sneezing, neck pain, neck stiffness and tinnitus.  Respiratory: denies SOB, DOE, cough, chest tightness, and wheezing.  Cardiovascular: denies chest pain, palpitations and leg swelling.  Gastrointestinal: denies nausea, vomiting, abdominal pain, diarrhea, constipation, blood  in stool.  Genitourinary: denies dysuria, urgency, frequency, hematuria, flank pain and difficulty urinating.  Musculoskeletal: denies  myalgias, back pain, joint swelling, arthralgias and gait problem.   Skin: denies pallor, rash and wound.  Neurological: denies dizziness, seizures, syncope, weakness, light-headedness, numbness and headaches.   Hematological: denies adenopathy, easy bruising, personal or family bleeding history.  Psychiatric/ Behavioral: denies suicidal ideation, mood changes, confusion, nervousness, sleep disturbance and agitation.       All other systems are reviewed and negative.    PHYSICAL EXAM: VS:  BP 128/100 mmHg  Pulse 100  Ht 5\' 9"  (1.753 m)  Wt 264 lb (119.75 kg)  BMI 38.97 kg/m2 , BMI Body mass index is 38.97 kg/(m^2). GEN: Well nourished, well developed, in no acute distress HEENT: normal Neck: no JVD, carotid bruits, or masses Cardiac: Irreg. Irreg. ; no murmurs, rubs, or gallops,no edema  Respiratory:  clear to auscultation bilaterally, normal work of breathing GI: soft, nontender, nondistended, + BS MS: no deformity or atrophy Skin: warm and dry, no rash Neuro:  Strength and sensation are intact Psych: normal   EKG:  EKG is ordered today. Atrial fib at 101.   NS ST abn.     Recent Labs: 12/31/2014: Magnesium 1.8 05/31/2015: ALT 24; TSH 4.687* 06/01/2015: BUN 25*; Creatinine, Ser 1.44*; Hemoglobin 11.2*; Platelets 188; Potassium 4.1; Sodium 136    Lipid Panel No results found for: CHOL, TRIG, HDL, CHOLHDL, VLDL, LDLCALC, LDLDIRECT    Wt Readings from Last 3 Encounters:  06/18/15 264 lb (119.75 kg)  06/02/15 261 lb 14.5 oz (118.8 kg)  04/03/15 256 lb 6.4 oz (116.302 kg)      Other studies Reviewed: Additional studies/ records that were reviewed today include: . Review of the above records demonstrates:    ASSESSMENT AND PLAN:  1. Atrial fibrillation -    She is back in a-fib.  She is on amiodarone 200 mg a day .   She has not  been sleeping well - this may be contributing to her A-fib She has recently had pneumonia  Will have her see  Dr. Rayann Heman. Continue current meds. For now   2. Diabetes mellitus  3. Chronic diastolic CHF - better on LAsix 80 bid .  Continue .   Takes metalazone only as needed   4. Obesity.   Encouraged diet and weight loss.   5. Insomnia:    Asked her to review this issue with her primary MD   Current medicines are reviewed at length with the patient today.  The patient does not have concerns regarding medicines.  The following changes have been made:  no change  Labs/ tests ordered today include:  No orders of the defined types were placed in this encounter.     Disposition:   FU with me in 6 months      Signed, Indyah Saulnier, Wonda Cheng, MD  06/18/2015 3:21 PM    [facility] [address], [place], [place]  [address] Phone: [phone]; Fax: [phone]

## 2015-06-18 NOTE — Patient Instructions (Addendum)
Medication Instructions:  Your physician recommends that you continue on your current medications as directed. Please refer to the Current Medication list given to you today.   Labwork: None Ordered   Testing/Procedures: None Ordered   Follow-Up: Your physician wants you to schedule a sooner appointment with Dr. Rayann Heman for follow-up of atrial fibrillation  Your physician wants you to follow-up in: 6 months with Dr. Acie Fredrickson.  You will receive a reminder letter in the mail two months in advance. If you don't receive a letter, please call our office to schedule the follow-up appointment.   If you need a refill on your cardiac medications before your next appointment, please call your pharmacy.   Thank you for choosing [facility]! Christen Bame, RN [phone]

## 2015-06-20 ENCOUNTER — Other Ambulatory Visit (HOSPITAL_COMMUNITY): Payer: Self-pay | Admitting: Nurse Practitioner

## 2015-06-25 DIAGNOSIS — H04123 Dry eye syndrome of bilateral lacrimal glands: Secondary | ICD-10-CM | POA: Diagnosis not present

## 2015-06-25 DIAGNOSIS — E119 Type 2 diabetes mellitus without complications: Secondary | ICD-10-CM | POA: Diagnosis not present

## 2015-06-25 DIAGNOSIS — Z961 Presence of intraocular lens: Secondary | ICD-10-CM | POA: Diagnosis not present

## 2015-06-25 DIAGNOSIS — H26491 Other secondary cataract, right eye: Secondary | ICD-10-CM | POA: Diagnosis not present

## 2015-06-25 DIAGNOSIS — H43813 Vitreous degeneration, bilateral: Secondary | ICD-10-CM | POA: Diagnosis not present

## 2015-06-26 DIAGNOSIS — R42 Dizziness and giddiness: Secondary | ICD-10-CM | POA: Diagnosis not present

## 2015-06-26 DIAGNOSIS — N183 Chronic kidney disease, stage 3 (moderate): Secondary | ICD-10-CM | POA: Diagnosis not present

## 2015-06-26 DIAGNOSIS — I509 Heart failure, unspecified: Secondary | ICD-10-CM | POA: Diagnosis not present

## 2015-06-26 DIAGNOSIS — Z6838 Body mass index (BMI) 38.0-38.9, adult: Secondary | ICD-10-CM | POA: Diagnosis not present

## 2015-06-26 DIAGNOSIS — I4891 Unspecified atrial fibrillation: Secondary | ICD-10-CM | POA: Diagnosis not present

## 2015-06-26 DIAGNOSIS — J029 Acute pharyngitis, unspecified: Secondary | ICD-10-CM | POA: Diagnosis not present

## 2015-07-01 DIAGNOSIS — E1140 Type 2 diabetes mellitus with diabetic neuropathy, unspecified: Secondary | ICD-10-CM | POA: Diagnosis not present

## 2015-07-01 DIAGNOSIS — J188 Other pneumonia, unspecified organism: Secondary | ICD-10-CM | POA: Diagnosis not present

## 2015-07-01 DIAGNOSIS — I509 Heart failure, unspecified: Secondary | ICD-10-CM | POA: Diagnosis not present

## 2015-07-01 DIAGNOSIS — R69 Illness, unspecified: Secondary | ICD-10-CM | POA: Diagnosis not present

## 2015-07-01 DIAGNOSIS — Z794 Long term (current) use of insulin: Secondary | ICD-10-CM | POA: Diagnosis not present

## 2015-07-01 DIAGNOSIS — I129 Hypertensive chronic kidney disease with stage 1 through stage 4 chronic kidney disease, or unspecified chronic kidney disease: Secondary | ICD-10-CM | POA: Diagnosis not present

## 2015-07-01 DIAGNOSIS — I4891 Unspecified atrial fibrillation: Secondary | ICD-10-CM | POA: Diagnosis not present

## 2015-07-01 DIAGNOSIS — Z6837 Body mass index (BMI) 37.0-37.9, adult: Secondary | ICD-10-CM | POA: Diagnosis not present

## 2015-07-01 DIAGNOSIS — D692 Other nonthrombocytopenic purpura: Secondary | ICD-10-CM | POA: Diagnosis not present

## 2015-07-02 ENCOUNTER — Ambulatory Visit: Payer: Medicare HMO | Admitting: Cardiovascular Disease

## 2015-07-03 ENCOUNTER — Ambulatory Visit (HOSPITAL_COMMUNITY)
Admission: RE | Admit: 2015-07-03 | Discharge: 2015-07-03 | Disposition: A | Discharge status: Home / Self Care | Payer: Medicare HMO | Source: Ambulatory Visit | Attending: Nurse Practitioner | Admitting: Nurse Practitioner

## 2015-07-03 ENCOUNTER — Encounter (HOSPITAL_COMMUNITY): Payer: Self-pay | Admitting: Nurse Practitioner

## 2015-07-03 DIAGNOSIS — F419 Anxiety disorder, unspecified: Secondary | ICD-10-CM | POA: Diagnosis not present

## 2015-07-03 DIAGNOSIS — Z794 Long term (current) use of insulin: Secondary | ICD-10-CM | POA: Diagnosis not present

## 2015-07-03 DIAGNOSIS — R0602 Shortness of breath: Secondary | ICD-10-CM

## 2015-07-03 DIAGNOSIS — M810 Age-related osteoporosis without current pathological fracture: Secondary | ICD-10-CM | POA: Diagnosis not present

## 2015-07-03 DIAGNOSIS — E7800 Pure hypercholesterolemia, unspecified: Secondary | ICD-10-CM | POA: Insufficient documentation

## 2015-07-03 DIAGNOSIS — J449 Chronic obstructive pulmonary disease, unspecified: Secondary | ICD-10-CM | POA: Diagnosis not present

## 2015-07-03 DIAGNOSIS — M4800 Spinal stenosis, site unspecified: Secondary | ICD-10-CM | POA: Diagnosis not present

## 2015-07-03 DIAGNOSIS — E559 Vitamin D deficiency, unspecified: Secondary | ICD-10-CM | POA: Insufficient documentation

## 2015-07-03 DIAGNOSIS — I700 Atherosclerosis of aorta: Secondary | ICD-10-CM | POA: Insufficient documentation

## 2015-07-03 DIAGNOSIS — I509 Heart failure, unspecified: Secondary | ICD-10-CM | POA: Diagnosis not present

## 2015-07-03 DIAGNOSIS — Z0189 Encounter for other specified special examinations: Secondary | ICD-10-CM | POA: Diagnosis not present

## 2015-07-03 DIAGNOSIS — Z8249 Family history of ischemic heart disease and other diseases of the circulatory system: Secondary | ICD-10-CM | POA: Diagnosis not present

## 2015-07-03 DIAGNOSIS — Z7901 Long term (current) use of anticoagulants: Secondary | ICD-10-CM | POA: Diagnosis not present

## 2015-07-03 DIAGNOSIS — I110 Hypertensive heart disease with heart failure: Secondary | ICD-10-CM | POA: Insufficient documentation

## 2015-07-03 DIAGNOSIS — K449 Diaphragmatic hernia without obstruction or gangrene: Secondary | ICD-10-CM | POA: Diagnosis not present

## 2015-07-03 DIAGNOSIS — I481 Persistent atrial fibrillation: Secondary | ICD-10-CM | POA: Insufficient documentation

## 2015-07-03 DIAGNOSIS — R69 Illness, unspecified: Secondary | ICD-10-CM | POA: Diagnosis not present

## 2015-07-03 DIAGNOSIS — G8929 Other chronic pain: Secondary | ICD-10-CM | POA: Diagnosis not present

## 2015-07-03 DIAGNOSIS — E119 Type 2 diabetes mellitus without complications: Secondary | ICD-10-CM | POA: Diagnosis not present

## 2015-07-03 DIAGNOSIS — M419 Scoliosis, unspecified: Secondary | ICD-10-CM | POA: Insufficient documentation

## 2015-07-03 LAB — COMPREHENSIVE METABOLIC PANEL
ALBUMIN: 3.6 g/dL (ref 3.5–5.0)
ALT: 37 U/L (ref 14–54)
AST: 32 U/L (ref 15–41)
Alkaline Phosphatase: 80 U/L (ref 38–126)
Anion gap: 14 (ref 5–15)
BUN: 22 mg/dL — AB (ref 6–20)
CHLORIDE: 102 mmol/L (ref 101–111)
CO2: 25 mmol/L (ref 22–32)
CREATININE: 1.61 mg/dL — AB (ref 0.44–1.00)
Calcium: 9 mg/dL (ref 8.9–10.3)
GFR calc Af Amer: 37 mL/min — ABNORMAL LOW (ref >60)
GFR, EST NON AFRICAN AMERICAN: 32 mL/min — AB (ref >60)
GLUCOSE: 212 mg/dL — AB (ref 65–99)
Potassium: 3.5 mmol/L (ref 3.5–5.1)
Sodium: 141 mmol/L (ref 135–145)
Total Bilirubin: 1.2 mg/dL (ref 0.3–1.2)
Total Protein: 6.7 g/dL (ref 6.5–8.1)

## 2015-07-03 LAB — CBC
HEMATOCRIT: 34.8 % — AB (ref 36.0–46.0)
Hemoglobin: 11.1 g/dL — ABNORMAL LOW (ref 12.0–15.0)
MCH: 28.3 pg (ref 26.0–34.0)
MCHC: 31.9 g/dL (ref 30.0–36.0)
MCV: 88.8 fL (ref 78.0–100.0)
PLATELETS: 197 10*3/uL (ref 150–400)
RBC: 3.92 MIL/uL (ref 3.87–5.11)
RDW: 15 % (ref 11.5–15.5)
WBC: 10 10*3/uL (ref 4.0–10.5)

## 2015-07-03 LAB — TSH: TSH: 4.866 u[IU]/mL — AB (ref 0.350–4.500)

## 2015-07-03 NOTE — Patient Instructions (Signed)
Kristine [name] will call you with stress test appt and instructions

## 2015-07-03 NOTE — Progress Notes (Signed)
Patient ID: Kristine [name], female   DOB: November 21, 1945, 70 y.o.   MRN: [id number]     Primary Care Physician: Tivis Ringer, MD Referring Physician: Dr. Rayann Heman Cardiologist: Dr. Jettie Booze [name] is a 70 y.o. female with a h/o persistent afib, failing flecainide,tikosyn due to prolonged qt and currently on amiodarone. She was hospitalized with pneumonia in early March. She asked to be seen today for extreme fatigue. She was in SR while in hospital but had a few days of afib on arrival home. She has been in SR for several weeks now without any afib. She is describing chest tightness, arm and leg weakness with increased shortness of breath with exertion, relieved with rest. Last stress Myoview in 2015 and a low risk scan. She said that these symptoms actually started prior to getting sick with URI.   Today, she denies symptoms of orthopnea, PND, lower extremity edema, dizziness, presyncope, syncope, or neurologic sequela. Positive for shortness of breath, chest tightness with arm/leg weakness with exertion. The patient is tolerating medications without difficulties and is otherwise without complaint today.   Past Medical History  Diagnosis Date  . Persistent atrial fibrillation ([place])   . Morbid obesity ([place])   . HTN (hypertension)   . Abnormal cardiovascular function study     MYOVIEW SHOWING ANTERIOR WALL ATTENUATION  . High cholesterol   . Scoliosis   . Spinal stenosis   . Anxiety   . Osteoporosis   . Mild aortic sclerosis ([place])   . Vitamin D deficiency   . CHF (congestive heart failure) ([place])   . GERD (gastroesophageal reflux disease)   . Lichen planus   . Anasarca   . Asthma   . Pneumonia   . CAP (community acquired pneumonia)     "this makes 5 times in 10 years" (05/31/2015)  . Chronic bronchitis ([place])   . Type II diabetes mellitus ([place])   . History of hiatal hernia   . Osteoarthritis   . Degenerative disc disease   . Arthritis     "back" (05/31/2015)  . Chronic  back pain    Past Surgical History  Procedure Laterality Date  . Finger fracture surgery Right 1970s    "ring finger"  . Bladder suspension  1980s  . Colonoscopy  08/14/08  . Cardioversion N/A 01/07/2015    Procedure: CARDIOVERSION;  Surgeon: Skeet Latch, MD;  Location: [place];  Service: Cardiovascular;  Laterality: N/A;  . Cardioversion N/A 03/11/2015    Procedure: CARDIOVERSION;  Surgeon: Skeet Latch, MD;  Location: [facility] ENDOSCOPY;  Service: Cardiovascular;  Laterality: N/A;  . Fracture surgery    . Laparoscopic cholecystectomy  1980s  . Carpal tunnel release Left 1970s  . Vaginal hysterectomy  1980  . Tubal ligation    . Cardiac catheterization  11/16/09    SMOOTH AND NORMAL  . Cataract extraction w/ intraocular lens  implant, bilateral Bilateral ~ 2010    Current Outpatient Prescriptions  Medication Sig Dispense Refill  . ALPRAZolam (XANAX) 0.25 MG tablet Take 0.125-0.25 mg by mouth 2 (two) times daily as needed for anxiety.   0  . amiodarone (PACERONE) 200 MG tablet Take 1 tablet (200 mg total) by mouth daily. 180 tablet 3  . BD PEN NEEDLE NANO U/F 32G X 4 MM MISC Inject 1 each as directed See admin instructions. Use pen needles with insulin pens daily  11  . esomeprazole (NEXIUM) 40 MG capsule Take 40 mg by mouth daily as needed (for acid reflux).     Marland Kitchen  furosemide (LASIX) 80 MG tablet TAKE 1 TABLET (80 MG TOTAL) BY MOUTH 2 (TWO) TIMES DAILY. 180 tablet 3  . gabapentin (NEURONTIN) 100 MG capsule Take 100 mg by mouth at bedtime.     Marland Kitchen HUMALOG KWIKPEN 200 UNIT/ML SOPN Inject 6 Units into the skin 3 (three) times daily with meals.  6  . insulin detemir (LEVEMIR) 100 UNIT/ML injection Inject 50 Units into the skin 2 (two) times daily.    Marland Kitchen KLOR-CON 10 10 MEQ tablet Take 20 mEq by mouth daily.  11  . losartan (COZAAR) 100 MG tablet TAKE 1 TABLET BY MOUTH EVERY DAY 90 tablet 3  . Melatonin 1 MG CAPS Take by mouth at bedtime.    . metFORMIN (GLUCOPHAGE) 500 MG tablet Take  500-1,000 mg by mouth 2 (two) times daily with a meal. 1000 mg in the AM and 500mg  at night    . metolazone (ZAROXOLYN) 2.5 MG tablet Take 2.5 mg by mouth daily as needed (for shortness of breath).     . metoprolol succinate (TOPROL-XL) 50 MG 24 hr tablet Take 2 tablets by mouth in the morning and 1 tablet by mouth in the pm 270 tablet 3  . rivaroxaban (XARELTO) 20 MG TABS tablet Take 20 mg by mouth every morning.    . Vitamin D, Ergocalciferol, (DRISDOL) 50000 UNITS CAPS Take 50,000 Units by mouth every 7 (seven) days. Takes on Thursday    . XOPENEX HFA 45 MCG/ACT inhaler Take 1 puff by mouth 4 (four) times daily as needed for wheezing.   6   No current facility-administered medications for this encounter.    Allergies  Allergen Reactions  . Albuterol Other (See Comments)    REACTION: Tachycardia- AFib  . Epinephrine Other (See Comments)    Increases heart rate per patient.   . Penicillins Other (See Comments)    REACTION: flushing \\T \ hot  . Bee Venom Other (See Comments)    "makes me nervous"  . Ivp Dye [Iodinated Diagnostic Agents] Other (See Comments)    flushing    Social History   Social History  . Marital Status: Married    Spouse Name: N/A  . Number of Children: N/A  . Years of Education: N/A   Occupational History  . Not on file.   Social History Main Topics  . Smoking status: Never Smoker   . Smokeless tobacco: Never Used  . Alcohol Use: No  . Drug Use: No  . Sexual Activity: Yes   Other Topics Concern  . Not on file   Social History Narrative   Pt lives in [place] with spouse.   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    Family History  Problem Relation Age of Onset  . Stroke Father   . Hypertension Father   . Atrial fibrillation Father     HAD MURMUR  . Heart failure Mother   . Hypertension Brother   . Hypertension Sister   . Hypertension Son   .  Congestive Heart Failure Mother   . Hypertension Mother   . CAD Sister     EARLY  . CAD Brother     EARLY    ROS- All systems are reviewed and negative except as per the HPI above  Physical Exam: Filed Vitals:   07/03/15 1441  BP: 148/90  Pulse: 70  Height: 5\' 9"  (1.753 m)  Weight:  260 lb 12.8 oz (118.298 kg)    GEN- The patient is well appearing, alert and oriented x 3 today.   Head- normocephalic, atraumatic Eyes-  Sclera clear, conjunctiva pink Ears- hearing intact Oropharynx- clear Neck- supple, no JVP Lymph- no cervical lymphadenopathy Lungs- Clear to ausculation bilaterally, normal work of breathing Heart- Regular rate and rhythm, no murmurs, rubs or gallops, PMI not laterally displaced GI- soft, NT, ND, + BS Extremities- no clubbing, cyanosis, or edema MS- no significant deformity or atrophy Skin- no rash or lesion Psych- euthymic mood, full affect Neuro- strength and sensation are intact  EKG- NSR with RAD, possible RV hypertrophy, Non specific ST abnormality prolonged qtc at 527 ms.pr int 166 ms, qrs 90 ms Echo- 1/12 Left ventricle: The cavity size was normal. Wall thickness was  increased increased in a pattern of mild to moderate LVH.  Systolic function was normal. The estimated ejection fraction was  in the range of 55% to 60%. Biplane LVEF 56.8% by speckle  tracking. Normal global longitudinal strain of -17.1%. Wall  motion was normal; there were no regional wall motion  abnormalities. Features are consistent with a pseudonormal left  ventricular filling pattern, with concomitant abnormal relaxation  and increased filling pressure (grade 2 diastolic dysfunction). - Aortic valve: Trileaflet; mildly to moderately calcified  leaflets. - Mitral valve: Calcified annulus. There was mild regurgitation. - Left atrium: The atrium was mildly dilated. - Right ventricle: The cavity size was mildly dilated. - Right atrium: The atrium was at the upper limits  of normal in  size. Central venous pressure (est): 3 mm Hg. - Atrial septum: No defect or patent foramen ovale was identified. - Tricuspid valve: There was trivial regurgitation. - Pulmonary arteries: Systolic pressure could not be accurately  estimated. Incomplete tricuspid regurgitant Doppler envelope. - Pericardium, extracardiac: A trivial pericardial effusion was  identified.  Impressions:  - Mild to moderate LVH with LVEF 55-60% and grade 2 diastolic  dysfunction with increased filling pressures. Mild left atrial  enlargement. Mild MAC with mild mitral regurgitation. Sclerotic  aortic valve without stenosis. Mildly dilated RV with normal  contraction. Trivial tricuspid regurgitation, unable to  accurately assess PASP. Trivial pericardial effusion.  Assessment and Plan: 1. Afib  In SR for several weeks and appears not to be playing into current symptoms. Continue amiodarone at 200 mg a day as well as metoprolol TSH/cmet,cbc  2. Exertion chest pain with shortness of breath/ relieved with rest Lexi myoview May still be experiencing some deconditioning from Pneumonia, however symptoms are worrisome for ischemia   3. Diastolic heart failure  Lasix 80 mg bid Does not appear fluid overloaded Continue daily weights  4. HTN controlled  F/u as scheduled with Dr. Rayann Heman 4/24   Geroge Baseman. Maui Ahart, [facility] [address] [place], [place] [address] [phone]

## 2015-07-08 ENCOUNTER — Telehealth (HOSPITAL_COMMUNITY): Payer: Self-pay | Admitting: *Deleted

## 2015-07-08 NOTE — Telephone Encounter (Signed)
[place] for lexiscan -- approval 4/17-6/16. PA# [id number].  Ebony scheduler with [facility] to call pt to schedule for 2 day testing.

## 2015-07-15 ENCOUNTER — Encounter: Payer: Self-pay | Admitting: Internal Medicine

## 2015-07-15 ENCOUNTER — Ambulatory Visit (INDEPENDENT_AMBULATORY_CARE_PROVIDER_SITE_OTHER): Payer: Medicare HMO | Admitting: Internal Medicine

## 2015-07-15 VITALS — BP 138/88 | HR 56 | Ht 68.6 in | Wt 258.8 lb

## 2015-07-15 DIAGNOSIS — I481 Persistent atrial fibrillation: Secondary | ICD-10-CM

## 2015-07-15 DIAGNOSIS — I119 Hypertensive heart disease without heart failure: Secondary | ICD-10-CM | POA: Diagnosis not present

## 2015-07-15 DIAGNOSIS — I4819 Other persistent atrial fibrillation: Secondary | ICD-10-CM

## 2015-07-15 NOTE — Patient Instructions (Signed)
Medication Instructions:  Your physician recommends that you continue on your current medications as directed. Please refer to the Current Medication list given to you today.   Labwork: None ordered   Testing/Procedures: Please schedule the stress test ordered last week by afib clinic  Order in by them already  Follow-Up: Your physician recommends that you schedule a follow-up appointment in: 2 weeks with Roderic Palau, NP    Any Other Special Instructions Will Be Listed Below (If Applicable).     If you need a refill on your cardiac medications before your next appointment, please call your pharmacy.

## 2015-07-15 NOTE — Progress Notes (Signed)
Primary Care Physician: Tivis Ringer, MD Referring Physician:  Dr Georgina Peer is a 70 y.o. female with a h/o obesity, DM, HTN, and persistent afib who presents for EP follow-up.  She has done very well since starting amiodarone.  Maintaining sinus rhythm and pleased with results.  Exercise tolerance is improved.  She recently had pneumonia.  She was still SOB when she saw Butch Penny in the AF clinic but currently feels that this is much improved.  She has SOB with moderate activity but is not very active. Today, she denies symptoms of chest pain, orthopnea, PND, lower extremity edema, presyncope, syncope, or neurologic sequela. The patient is tolerating medications without difficulties and is otherwise without complaint today.   Past Medical History  Diagnosis Date  . Persistent atrial fibrillation ([place])   . Morbid obesity ([place])   . HTN (hypertension)   . Abnormal cardiovascular function study     MYOVIEW SHOWING ANTERIOR WALL ATTENUATION  . High cholesterol   . Scoliosis   . Spinal stenosis   . Anxiety   . Osteoporosis   . Mild aortic sclerosis ([place])   . Vitamin D deficiency   . CHF (congestive heart failure) ([place])   . GERD (gastroesophageal reflux disease)   . Lichen planus   . Anasarca   . Asthma   . Pneumonia   . CAP (community acquired pneumonia)     "this makes 5 times in 10 years" (05/31/2015)  . Chronic bronchitis ([place])   . Type II diabetes mellitus ([place])   . History of hiatal hernia   . Osteoarthritis   . Degenerative disc disease   . Arthritis     "back" (05/31/2015)  . Chronic back pain    Past Surgical History  Procedure Laterality Date  . Finger fracture surgery Right 1970s    "ring finger"  . Bladder suspension  1980s  . Colonoscopy  08/14/08  . Cardioversion N/A 01/07/2015    Procedure: CARDIOVERSION;  Surgeon: Skeet Latch, MD;  Location: [facility];  Service: Cardiovascular;  Laterality: N/A;  . Cardioversion N/A 03/11/2015   Procedure: CARDIOVERSION;  Surgeon: Skeet Latch, MD;  Location: [facility] ENDOSCOPY;  Service: Cardiovascular;  Laterality: N/A;  . Fracture surgery    . Laparoscopic cholecystectomy  1980s  . Carpal tunnel release Left 1970s  . Vaginal hysterectomy  1980  . Tubal ligation    . Cardiac catheterization  11/16/09    SMOOTH AND NORMAL  . Cataract extraction w/ intraocular lens  implant, bilateral Bilateral ~ 2010    Current Outpatient Prescriptions  Medication Sig Dispense Refill  . ALPRAZolam (XANAX) 0.25 MG tablet Take 0.125-0.25 mg by mouth 2 (two) times daily as needed for anxiety.   0  . amiodarone (PACERONE) 200 MG tablet Take 1 tablet (200 mg total) by mouth daily. 180 tablet 3  . BD PEN NEEDLE NANO U/F 32G X 4 MM MISC Inject 1 each as directed See admin instructions. Use pen needles with insulin pens daily  11  . esomeprazole (NEXIUM) 40 MG capsule Take 40 mg by mouth daily as needed (for acid reflux).     . furosemide (LASIX) 80 MG tablet TAKE 1 TABLET (80 MG TOTAL) BY MOUTH 2 (TWO) TIMES DAILY. 180 tablet 3  . gabapentin (NEURONTIN) 100 MG capsule Take 100 mg by mouth at bedtime.     Marland Kitchen HUMALOG KWIKPEN 200 UNIT/ML SOPN Inject 6 Units into the skin 3 (three) times daily with meals.  6  . insulin  detemir (LEVEMIR) 100 UNIT/ML injection Inject 50 Units into the skin 2 (two) times daily.    Marland Kitchen KLOR-CON 10 10 MEQ tablet Take 20 mEq by mouth daily.  11  . losartan (COZAAR) 100 MG tablet TAKE 1 TABLET BY MOUTH EVERY DAY 90 tablet 3  . Melatonin 1 MG CAPS Take by mouth at bedtime.    . metFORMIN (GLUCOPHAGE) 500 MG tablet Take 500-1,000 mg by mouth 2 (two) times daily with a meal. 1000 mg in the AM and 500mg  at night    . metolazone (ZAROXOLYN) 2.5 MG tablet Take 2.5 mg by mouth daily as needed (for shortness of breath).     . metoprolol succinate (TOPROL-XL) 50 MG 24 hr tablet Take 2 tablets by mouth in the morning and 1 tablet by mouth in the pm 270 tablet 3  . rivaroxaban (XARELTO) 20 MG TABS  tablet Take 20 mg by mouth every morning.    . Vitamin D, Ergocalciferol, (DRISDOL) 50000 UNITS CAPS Take 50,000 Units by mouth every 7 (seven) days. Takes on Thursday    . XOPENEX HFA 45 MCG/ACT inhaler Take 1 puff by mouth 4 (four) times daily as needed for wheezing.   6   No current facility-administered medications for this visit.    Allergies  Allergen Reactions  . Albuterol Other (See Comments)    REACTION: Tachycardia- AFib  . Epinephrine Other (See Comments)    Increases heart rate per patient.   . Penicillins Other (See Comments)    REACTION: flushing \\T \ hot  . Bee Venom Other (See Comments)    "makes me nervous"  . Ivp Dye [Iodinated Diagnostic Agents] Other (See Comments)    flushing    Social History   Social History  . Marital Status: Married    Spouse Name: N/A  . Number of Children: N/A  . Years of Education: N/A   Occupational History  . Not on file.   Social History Main Topics  . Smoking status: Never Smoker   . Smokeless tobacco: Never Used  . Alcohol Use: No  . Drug Use: No  . Sexual Activity: Yes   Other Topics Concern  . Not on file   Social History Narrative   Pt lives in [place] with spouse.   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    Family History  Problem Relation Age of Onset  . Stroke Father   . Hypertension Father   . Atrial fibrillation Father     HAD MURMUR  . Heart failure Mother   . Hypertension Brother   . Hypertension Sister   . Hypertension Son   . Congestive Heart Failure Mother   . Hypertension Mother   . CAD Sister     EARLY  . CAD Brother     EARLY    ROS- All systems are reviewed and negative except as per the HPI above  Physical Exam: Filed Vitals:   07/15/15 0924  BP: 138/88  Pulse: 56  Height: 5' 8.6" (1.742 m)  Weight: 258 lb 12.8 oz (117.391 kg)  SpO2: 98%    GEN- The patient is morbidly obese  appearing, alert and oriented x 3 today.   Head- normocephalic, atraumatic Eyes-  Sclera clear, conjunctiva pink Ears- hearing intact Oropharynx- clear Neck- supple,  Lungs- Clear to ausculation bilaterally, normal work of breathing Heart- regular rate and rhythm  GI- soft,  NT, ND, + BS Extremities- no clubbing, cyanosis, no edema MS- no significant deformity or atrophy Skin- no rash or lesion Psych- euthymic mood, full affect Neuro- strength and sensation are intact  EKG today reveals sinus rhythm 56 bpm, PR 154 msec, Qtc 521 msec, otherwise normal ekg  AF clinic notes reviewed  Assessment and Plan:  1.  Longstanding persistent afib Doing better with sinus rhythm Continue long term anticoagulation  2. Hypertensive cardiovascular disease 2 gram sodium diet   3. Obesity Body mass index is 38.68 kg/(m^2). Weight loss is strongly encouraged  4. Diastolic dysfunction bmet today  5. SOB Will obtain myoview to evaluate for ischemia as the cause  Follow-up with Dr Acie Fredrickson as scheduled Follow-up with Butch Penny in the AF clinic in 2 months I will see when needed  Thompson Grayer MD, [facility] 07/15/2015 6:06 PM

## 2015-07-17 NOTE — Addendum Note (Signed)
Addended by: Freada Bergeron on: 07/17/2015 05:08 PM   Modules accepted: Orders

## 2015-07-22 ENCOUNTER — Telehealth (HOSPITAL_COMMUNITY): Payer: Self-pay | Admitting: *Deleted

## 2015-07-22 NOTE — Telephone Encounter (Signed)
Patient given detailed instructions per Myocardial Perfusion Study Information Sheet for the test on 07/24/15 Patient notified to arrive 15 minutes early and that it is imperative to arrive on time for appointment to keep from having the test rescheduled.  If you need to cancel or reschedule your appointment, please call the office within 24 hours of your appointment. Failure to do so may result in a cancellation of your appointment, and a $50 no show fee. Patient verbalized understanding. Hubbard Robinson, RN

## 2015-07-24 ENCOUNTER — Ambulatory Visit (HOSPITAL_COMMUNITY): Payer: Medicare HMO | Attending: Internal Medicine

## 2015-07-24 DIAGNOSIS — E119 Type 2 diabetes mellitus without complications: Secondary | ICD-10-CM | POA: Diagnosis not present

## 2015-07-24 DIAGNOSIS — R0602 Shortness of breath: Secondary | ICD-10-CM | POA: Insufficient documentation

## 2015-07-24 DIAGNOSIS — I10 Essential (primary) hypertension: Secondary | ICD-10-CM | POA: Insufficient documentation

## 2015-07-24 DIAGNOSIS — R0609 Other forms of dyspnea: Secondary | ICD-10-CM | POA: Insufficient documentation

## 2015-07-24 MED ORDER — REGADENOSON 0.4 MG/5ML IV SOLN
0.4000 mg | Freq: Once | INTRAVENOUS | Status: AC
  Administered 2015-07-24: 0.4 mg via INTRAVENOUS

## 2015-07-24 MED ORDER — TECHNETIUM TC 99M SESTAMIBI GENERIC - CARDIOLITE
32.6000 | Freq: Once | INTRAVENOUS | Status: AC | PRN
  Administered 2015-07-24: 33 via INTRAVENOUS

## 2015-07-25 ENCOUNTER — Ambulatory Visit (HOSPITAL_COMMUNITY): Payer: Medicare HMO | Attending: Cardiology

## 2015-07-25 DIAGNOSIS — R0989 Other specified symptoms and signs involving the circulatory and respiratory systems: Secondary | ICD-10-CM

## 2015-07-25 LAB — MYOCARDIAL PERFUSION IMAGING
CHL CUP NUCLEAR SDS: 6
CHL CUP RESTING HR STRESS: 57 {beats}/min
LHR: 0.25
LV sys vol: 50 mL
LVDIAVOL: 122 mL (ref 46–106)
NUC STRESS TID: 0.94
Peak HR: 63 {beats}/min
SRS: 6
SSS: 11

## 2015-07-25 MED ORDER — TECHNETIUM TC 99M SESTAMIBI GENERIC - CARDIOLITE
31.7000 | Freq: Once | INTRAVENOUS | Status: AC | PRN
  Administered 2015-07-25: 31.7 via INTRAVENOUS

## 2015-08-01 ENCOUNTER — Ambulatory Visit (HOSPITAL_COMMUNITY)
Admission: RE | Admit: 2015-08-01 | Discharge: 2015-08-01 | Disposition: A | Discharge status: Home / Self Care | Payer: Medicare HMO | Source: Ambulatory Visit | Attending: Nurse Practitioner | Admitting: Nurse Practitioner

## 2015-08-01 ENCOUNTER — Encounter (HOSPITAL_COMMUNITY): Payer: Self-pay | Admitting: Nurse Practitioner

## 2015-08-01 VITALS — BP 120/68 | HR 59 | Ht 68.0 in | Wt 261.2 lb

## 2015-08-01 DIAGNOSIS — E559 Vitamin D deficiency, unspecified: Secondary | ICD-10-CM | POA: Insufficient documentation

## 2015-08-01 DIAGNOSIS — Z7901 Long term (current) use of anticoagulants: Secondary | ICD-10-CM | POA: Diagnosis not present

## 2015-08-01 DIAGNOSIS — I5030 Unspecified diastolic (congestive) heart failure: Secondary | ICD-10-CM | POA: Diagnosis not present

## 2015-08-01 DIAGNOSIS — I4891 Unspecified atrial fibrillation: Secondary | ICD-10-CM | POA: Diagnosis not present

## 2015-08-01 DIAGNOSIS — Z91041 Radiographic dye allergy status: Secondary | ICD-10-CM | POA: Diagnosis not present

## 2015-08-01 DIAGNOSIS — E119 Type 2 diabetes mellitus without complications: Secondary | ICD-10-CM | POA: Insufficient documentation

## 2015-08-01 DIAGNOSIS — Z8249 Family history of ischemic heart disease and other diseases of the circulatory system: Secondary | ICD-10-CM | POA: Diagnosis not present

## 2015-08-01 DIAGNOSIS — R69 Illness, unspecified: Secondary | ICD-10-CM | POA: Diagnosis not present

## 2015-08-01 DIAGNOSIS — E1159 Type 2 diabetes mellitus with other circulatory complications: Secondary | ICD-10-CM | POA: Diagnosis not present

## 2015-08-01 DIAGNOSIS — I481 Persistent atrial fibrillation: Secondary | ICD-10-CM | POA: Diagnosis not present

## 2015-08-01 DIAGNOSIS — Z79899 Other long term (current) drug therapy: Secondary | ICD-10-CM | POA: Insufficient documentation

## 2015-08-01 DIAGNOSIS — I4819 Other persistent atrial fibrillation: Secondary | ICD-10-CM

## 2015-08-01 DIAGNOSIS — N183 Chronic kidney disease, stage 3 (moderate): Secondary | ICD-10-CM | POA: Diagnosis not present

## 2015-08-01 DIAGNOSIS — M810 Age-related osteoporosis without current pathological fracture: Secondary | ICD-10-CM | POA: Insufficient documentation

## 2015-08-01 DIAGNOSIS — Z794 Long term (current) use of insulin: Secondary | ICD-10-CM | POA: Insufficient documentation

## 2015-08-01 DIAGNOSIS — Z880 Allergy status to penicillin: Secondary | ICD-10-CM | POA: Insufficient documentation

## 2015-08-01 DIAGNOSIS — I129 Hypertensive chronic kidney disease with stage 1 through stage 4 chronic kidney disease, or unspecified chronic kidney disease: Secondary | ICD-10-CM | POA: Diagnosis not present

## 2015-08-01 DIAGNOSIS — K219 Gastro-esophageal reflux disease without esophagitis: Secondary | ICD-10-CM | POA: Diagnosis not present

## 2015-08-01 DIAGNOSIS — Z6838 Body mass index (BMI) 38.0-38.9, adult: Secondary | ICD-10-CM | POA: Diagnosis not present

## 2015-08-01 DIAGNOSIS — E784 Other hyperlipidemia: Secondary | ICD-10-CM | POA: Diagnosis not present

## 2015-08-01 DIAGNOSIS — R0602 Shortness of breath: Secondary | ICD-10-CM | POA: Diagnosis not present

## 2015-08-01 DIAGNOSIS — Z823 Family history of stroke: Secondary | ICD-10-CM | POA: Insufficient documentation

## 2015-08-01 DIAGNOSIS — E7800 Pure hypercholesterolemia, unspecified: Secondary | ICD-10-CM | POA: Insufficient documentation

## 2015-08-01 DIAGNOSIS — I110 Hypertensive heart disease with heart failure: Secondary | ICD-10-CM | POA: Diagnosis not present

## 2015-08-01 DIAGNOSIS — J189 Pneumonia, unspecified organism: Secondary | ICD-10-CM | POA: Diagnosis not present

## 2015-08-01 NOTE — Progress Notes (Signed)
Patient ID: AISOSA VIELMA, female   DOB: [birth year], 70 y.o.   MRN: [id number]     Primary Care Physician: Tivis Ringer, MD Referring Physician: Dr. Rayann Heman Cardiologist: Dr. Jettie Booze Kristine [name] is a 70 y.o. female with a h/o persistent afib, failing flecainide,tikosyn due to prolonged qt and currently on amiodarone. She was hospitalized with pneumonia in early March. She asked to be seen today for extreme fatigue. She was in SR while in hospital but had a few days of afib on arrival home. She has been in SR for several weeks now without any afib. She is describing chest tightness, arm and leg weakness with increased shortness of breath with exertion, relieved with rest. Last stress Myoview in 2015 and a low risk scan. She said that these symptoms actually started prior to getting sick with URI. She was scheduled for a lexi myoview which was low risk.  She returns 5/11 and is feeling better. She thinks the remnants of having pneumonia were contributing to symptoms when last seen. She is still concerned re her residual shortness of breath and is wanting to have consultation with pulmonologist. She is also wanting to have a dietary consult. She also sees her PCP this pm as well.  Today, she denies symptoms of orthopnea, PND, lower extremity edema, dizziness, presyncope, syncope, or neurologic sequela. Positive for shortness of breath,improved but ongoing.The patient is tolerating medications without difficulties and is otherwise without complaint today.   Past Medical History  Diagnosis Date  . Persistent atrial fibrillation ([place])   . Morbid obesity ([place])   . HTN (hypertension)   . Abnormal cardiovascular function study     MYOVIEW SHOWING ANTERIOR WALL ATTENUATION  . High cholesterol   . Scoliosis   . Spinal stenosis   . Anxiety   . Osteoporosis   . Mild aortic sclerosis ([place])   . Vitamin D deficiency   . CHF (congestive heart failure) ([place])   . GERD (gastroesophageal  reflux disease)   . Lichen planus   . Anasarca   . Asthma   . Pneumonia   . CAP (community acquired pneumonia)     "this makes 5 times in 10 years" (05/31/2015)  . Chronic bronchitis ([place])   . Type II diabetes mellitus ([place])   . History of hiatal hernia   . Osteoarthritis   . Degenerative disc disease   . Arthritis     "back" (05/31/2015)  . Chronic back pain    Past Surgical History  Procedure Laterality Date  . Finger fracture surgery Right 1970s    "ring finger"  . Bladder suspension  1980s  . Colonoscopy  08/14/08  . Cardioversion N/A 01/07/2015    Procedure: CARDIOVERSION;  Surgeon: Skeet Latch, MD;  Location: [facility];  Service: Cardiovascular;  Laterality: N/A;  . Cardioversion N/A 03/11/2015    Procedure: CARDIOVERSION;  Surgeon: Skeet Latch, MD;  Location: [facility] ENDOSCOPY;  Service: Cardiovascular;  Laterality: N/A;  . Fracture surgery    . Laparoscopic cholecystectomy  1980s  . Carpal tunnel release Left 1970s  . Vaginal hysterectomy  1980  . Tubal ligation    . Cardiac catheterization  11/16/09    SMOOTH AND NORMAL  . Cataract extraction w/ intraocular lens  implant, bilateral Bilateral ~ 2010    Current Outpatient Prescriptions  Medication Sig Dispense Refill  . ALPRAZolam (XANAX) 0.25 MG tablet Take 0.125-0.25 mg by mouth 2 (two) times daily as needed for anxiety.   0  . amiodarone (PACERONE)  200 MG tablet Take 1 tablet (200 mg total) by mouth daily. 180 tablet 3  . BD PEN NEEDLE NANO U/F 32G X 4 MM MISC Inject 1 each as directed See admin instructions. Use pen needles with insulin pens daily  11  . esomeprazole (NEXIUM) 40 MG capsule Take 40 mg by mouth daily as needed (for acid reflux).     . furosemide (LASIX) 80 MG tablet TAKE 1 TABLET (80 MG TOTAL) BY MOUTH 2 (TWO) TIMES DAILY. 180 tablet 3  . gabapentin (NEURONTIN) 100 MG capsule Take 100 mg by mouth at bedtime.     Marland Kitchen HUMALOG KWIKPEN 200 UNIT/ML SOPN Inject 6 Units into the skin 3 (three) times  daily with meals.  6  . insulin detemir (LEVEMIR) 100 UNIT/ML injection Inject 50 Units into the skin 2 (two) times daily.    Marland Kitchen KLOR-CON 10 10 MEQ tablet Take 20 mEq by mouth daily.  11  . losartan (COZAAR) 100 MG tablet TAKE 1 TABLET BY MOUTH EVERY DAY 90 tablet 3  . Melatonin 1 MG CAPS Take by mouth at bedtime.    . metFORMIN (GLUCOPHAGE) 500 MG tablet Take 500-1,000 mg by mouth 2 (two) times daily with a meal. 1000 mg in the AM and 500mg  at night    . metolazone (ZAROXOLYN) 2.5 MG tablet Take 2.5 mg by mouth daily as needed (for shortness of breath).     . metoprolol succinate (TOPROL-XL) 50 MG 24 hr tablet Take 2 tablets by mouth in the morning and 1 tablet by mouth in the pm 270 tablet 3  . rivaroxaban (XARELTO) 20 MG TABS tablet Take 20 mg by mouth every morning.    . Vitamin D, Ergocalciferol, (DRISDOL) 50000 UNITS CAPS Take 50,000 Units by mouth every 7 (seven) days. Takes on Thursday    . XOPENEX HFA 45 MCG/ACT inhaler Take 1 puff by mouth 4 (four) times daily as needed for wheezing.   6   No current facility-administered medications for this encounter.    Allergies  Allergen Reactions  . Albuterol Other (See Comments)    REACTION: Tachycardia- AFib  . Epinephrine Other (See Comments)    Increases heart rate per patient.   . Penicillins Other (See Comments)    REACTION: flushing \\T \ hot  . Bee Venom Other (See Comments)    "makes me nervous"  . Ivp Dye [Iodinated Diagnostic Agents] Other (See Comments)    flushing    Social History   Social History  . Marital Status: Married    Spouse Name: N/A  . Number of Children: N/A  . Years of Education: N/A   Occupational History  . Not on file.   Social History Main Topics  . Smoking status: Never Smoker   . Smokeless tobacco: Never Used  . Alcohol Use: No  . Drug Use: No  . Sexual Activity: Yes   Other Topics Concern  . Not on file   Social History Narrative   Pt lives in [place] with spouse.   Retired Youth worker.  RN.   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    Family History  Problem Relation Age of Onset  . Stroke Father   . Hypertension Father   . Atrial fibrillation Father     HAD MURMUR  . Heart failure Mother   . Hypertension Brother   . Hypertension Sister   . Hypertension Son   .  Congestive Heart Failure Mother   . Hypertension Mother   . CAD Sister     EARLY  . CAD Brother     EARLY    ROS- All systems are reviewed and negative except as per the HPI above  Physical Exam: Filed Vitals:   08/01/15 1155  BP: 120/68  Pulse: 59  Height: 5\' 8"  (1.727 m)  Weight: 261 lb 3.2 oz (118.48 kg)    GEN- The patient is well appearing, alert and oriented x 3 today.   Head- normocephalic, atraumatic Eyes-  Sclera clear, conjunctiva pink Ears- hearing intact Oropharynx- clear Neck- supple, no JVP Lymph- no cervical lymphadenopathy Lungs- Clear to ausculation bilaterally, normal work of breathing Heart- Regular rate and rhythm, no murmurs, rubs or gallops, PMI not laterally displaced GI- soft, NT, ND, + BS Extremities- no clubbing, cyanosis, or edema MS- no significant deformity or atrophy Skin- no rash or lesion Psych- euthymic mood, full affect Neuro- strength and sensation are intact  EKG- Sinus brady with hr of  59 ms, pulmonary disease pattern, RAD, pr int 174 ms, qrs 92 ms, qtc 504 ms Epic records reviewed Echo- 1/12 Left ventricle: The cavity size was normal. Wall thickness was  increased increased in a pattern of mild to moderate LVH.  Systolic function was normal. The estimated ejection fraction was  in the range of 55% to 60%. Biplane LVEF 56.8% by speckle  tracking. Normal global longitudinal strain of -17.1%. Wall  motion was normal; there were no regional wall motion  abnormalities. Features are consistent with a pseudonormal left  ventricular filling pattern, with concomitant  abnormal relaxation  and increased filling pressure (grade 2 diastolic dysfunction). - Aortic valve: Trileaflet; mildly to moderately calcified  leaflets. - Mitral valve: Calcified annulus. There was mild regurgitation. - Left atrium: The atrium was mildly dilated. - Right ventricle: The cavity size was mildly dilated. - Right atrium: The atrium was at the upper limits of normal in  size. Central venous pressure (est): 3 mm Hg. - Atrial septum: No defect or patent foramen ovale was identified. - Tricuspid valve: There was trivial regurgitation. - Pulmonary arteries: Systolic pressure could not be accurately  estimated. Incomplete tricuspid regurgitant Doppler envelope. - Pericardium, extracardiac: A trivial pericardial effusion was  identified.  Impressions:  - Mild to moderate LVH with LVEF 55-60% and grade 2 diastolic  dysfunction with increased filling pressures. Mild left atrial  enlargement. Mild MAC with mild mitral regurgitation. Sclerotic  aortic valve without stenosis. Mildly dilated RV with normal  contraction. Trivial tricuspid regurgitation, unable to  accurately assess PASP. Trivial pericardial effusion.  Lexi myoview 07/26/15 Study Highlights     The left ventricular ejection fraction is normal (55-65%).  Nuclear stress EF: 59%.  There was no ST segment deviation noted during stress.  This is a low risk study.     Assessment and Plan: 1. Afib  In SR on amiodarone Continue amiodarone at 200 mg a day as well as metoprolol TSH mildly elevated over the last few weeks, will have her PCP draw full thyroid panel today  2.Shortness of breath, improved but not resolved Lexi myoview low risk Will refer to pulmonology   3. Diastolic heart failure  Lasix 80 mg bid Does not appear fluid overloaded/weight stable Continue daily weights  4. HTN controlled  F/u as scheduled with PCP this pm afib clinic in 3 months   Butch Penny C. Aman Batley, [facility] [place], [place]  [id number] [phone]     

## 2015-08-07 ENCOUNTER — Institutional Professional Consult (permissible substitution): Payer: Medicare HMO | Admitting: Internal Medicine

## 2015-08-16 ENCOUNTER — Ambulatory Visit: Payer: Medicare HMO | Admitting: Nutrition

## 2015-08-17 ENCOUNTER — Other Ambulatory Visit: Payer: Self-pay | Admitting: Cardiovascular Disease

## 2015-08-23 DIAGNOSIS — M7061 Trochanteric bursitis, right hip: Secondary | ICD-10-CM | POA: Diagnosis not present

## 2015-08-23 DIAGNOSIS — E790 Hyperuricemia without signs of inflammatory arthritis and tophaceous disease: Secondary | ICD-10-CM | POA: Diagnosis not present

## 2015-08-23 DIAGNOSIS — M2550 Pain in unspecified joint: Secondary | ICD-10-CM | POA: Diagnosis not present

## 2015-08-23 DIAGNOSIS — R768 Other specified abnormal immunological findings in serum: Secondary | ICD-10-CM | POA: Diagnosis not present

## 2015-08-23 DIAGNOSIS — M150 Primary generalized (osteo)arthritis: Secondary | ICD-10-CM | POA: Diagnosis not present

## 2015-08-25 ENCOUNTER — Encounter: Payer: Self-pay | Admitting: Nurse Practitioner

## 2015-09-18 ENCOUNTER — Encounter: Payer: Self-pay | Admitting: Nutrition

## 2015-09-18 ENCOUNTER — Encounter: Payer: Medicare HMO | Attending: Internal Medicine | Admitting: Nutrition

## 2015-09-18 VITALS — Ht 68.5 in | Wt 262.0 lb

## 2015-09-18 DIAGNOSIS — E119 Type 2 diabetes mellitus without complications: Secondary | ICD-10-CM | POA: Insufficient documentation

## 2015-09-18 DIAGNOSIS — E669 Obesity, unspecified: Secondary | ICD-10-CM | POA: Diagnosis not present

## 2015-09-18 DIAGNOSIS — E118 Type 2 diabetes mellitus with unspecified complications: Secondary | ICD-10-CM

## 2015-09-18 DIAGNOSIS — N289 Disorder of kidney and ureter, unspecified: Secondary | ICD-10-CM | POA: Diagnosis not present

## 2015-09-18 DIAGNOSIS — I10 Essential (primary) hypertension: Secondary | ICD-10-CM | POA: Insufficient documentation

## 2015-09-18 DIAGNOSIS — E782 Mixed hyperlipidemia: Secondary | ICD-10-CM | POA: Diagnosis not present

## 2015-09-18 DIAGNOSIS — IMO0002 Reserved for concepts with insufficient information to code with codable children: Secondary | ICD-10-CM

## 2015-09-18 DIAGNOSIS — E1165 Type 2 diabetes mellitus with hyperglycemia: Secondary | ICD-10-CM

## 2015-09-18 DIAGNOSIS — I5032 Chronic diastolic (congestive) heart failure: Secondary | ICD-10-CM

## 2015-09-18 NOTE — Progress Notes (Signed)
  Medical Nutrition Therapy:  Appt start time: 1000 end time:  1030.  Assessment:  Primary concerns today: Kristine [name] today for a follow up for CHF and diabetes. A1C down to 7.8%.  Has gained about 10 lbs in the last 3 months. 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 Changes: working on staying away from salt. She isn't eating a lot of protein. She admits to binging at times on milkshake once a week.. Eats out at least 1 time per day. Loves watermelon.  Eats usually 1 meat and 2 vegetables. 50 units of Levemir and 12 units TID of Humalog. Fluid allowance is 64 oz. Needs to increase exercise, increase fiber in diet and follow low sodium low fat high fiber diet.  Preferred Learning Style:   No preference indicated   Learning Readiness:   Ready  MEDICATIONS: Lasix, Xarelto   DIETARY INTAKE:  24-hr recall:  B ( AM): Tomato juice and peanutbutter crackers -6 Snk ( AM):  Apple or fruit L ( PM):  1/2 of a hoagie sandwich- ham and mayonaise with 1 c watermelon and Sweet tea Snk ( PM):  Pb crackers 6, Strawberry lemonade reg D ( PM):  Wendy's mango chicken salad 1/2 of regular;  lemonade Snk ( PM): sometimes: grape or fruit or apple/peanutbutter or popcorn Beverages: tea with sugar or splenda, skim-2% milk, black coffee, lemonaid  Usual physical activity: walks or plays ball with grandchildren for 10 minutes 3 x day   Estimated energy needs: 1600 calories 180 g carbohydrates 120 g protein 44 g fat  Progress Towards Goal(s):  In progress.   Nutritional Diagnosis:  NI-5.10.2 Excessive mineral intake (specify): sodium As related to related fluid retention from congestive heart failure.  As evidenced by diet recall of frequent high sodium meals.  Nutritional Diagnosis:  [place]-2.1 Inpaired nutrition utilization As related to glucose metabolism.  As evidenced by Hgb A1c of 8.2%.    Intervention:  Nutrition counseling provided.   Goals 1. Follow My Plate 2. Cut out tomato  juice, cheese and other salty foods. 3. Limit watermelon to 1 cup serving per meal due to high water content 4. Increase low carb vegetables with lunch an dinner. 5. Cut out snacks between meals. 6. Take meds as prescribed. 7. Lose 1-2 lbs per week. 8. Limit sodium intake per serving to less than 200 mg of sodium and less than 1500 mg per day. 9. Get A1C to 7%.   Teaching Method Utilized:  Visual Auditory Hands on  Handouts given during visit include:  Low Sodium MNT  1500 calorie Meal Plan  Low sodium flavoring tips  Barriers to learning/adherence to lifestyle change: none  Demonstrated degree of understanding via:  Teach Back   Monitoring/Evaluation:  Dietary intake, exercise, and body weight in 2 month(s).

## 2015-09-23 ENCOUNTER — Ambulatory Visit: Payer: Medicare HMO | Admitting: Skilled Nursing Facility1

## 2015-09-26 DIAGNOSIS — R69 Illness, unspecified: Secondary | ICD-10-CM | POA: Diagnosis not present

## 2015-10-09 ENCOUNTER — Other Ambulatory Visit (HOSPITAL_COMMUNITY): Payer: Self-pay | Admitting: Nurse Practitioner

## 2015-10-09 NOTE — Patient Instructions (Addendum)
Goals 1. Follow My Plate 2. Cut out tomato juice, cheese and other salty foods. 3. Limit watermelon to 1 cup serving per meal due to high water content 4. Increase low carb vegetables with lunch an dinner. 5. Cut out snacks between meals. 6. Take meds as prescribed. 7. Lose 1-2 lbs per week. 8. Limit sodium intake per serving to less than 200 mg of sodium and less than 1500 mg per day. 9. Get A1C to 7%.

## 2015-10-16 ENCOUNTER — Ambulatory Visit: Payer: Medicare HMO | Admitting: Nutrition

## 2015-10-22 DIAGNOSIS — R69 Illness, unspecified: Secondary | ICD-10-CM | POA: Diagnosis not present

## 2015-10-22 DIAGNOSIS — I129 Hypertensive chronic kidney disease with stage 1 through stage 4 chronic kidney disease, or unspecified chronic kidney disease: Secondary | ICD-10-CM | POA: Diagnosis not present

## 2015-10-22 DIAGNOSIS — Z79899 Other long term (current) drug therapy: Secondary | ICD-10-CM | POA: Diagnosis not present

## 2015-10-22 DIAGNOSIS — E1140 Type 2 diabetes mellitus with diabetic neuropathy, unspecified: Secondary | ICD-10-CM | POA: Diagnosis not present

## 2015-10-22 DIAGNOSIS — I509 Heart failure, unspecified: Secondary | ICD-10-CM | POA: Diagnosis not present

## 2015-10-22 DIAGNOSIS — N183 Chronic kidney disease, stage 3 (moderate): Secondary | ICD-10-CM | POA: Diagnosis not present

## 2015-10-22 DIAGNOSIS — I4891 Unspecified atrial fibrillation: Secondary | ICD-10-CM | POA: Diagnosis not present

## 2015-10-22 DIAGNOSIS — E784 Other hyperlipidemia: Secondary | ICD-10-CM | POA: Diagnosis not present

## 2015-10-22 DIAGNOSIS — I10 Essential (primary) hypertension: Secondary | ICD-10-CM | POA: Diagnosis not present

## 2015-10-31 ENCOUNTER — Ambulatory Visit (HOSPITAL_COMMUNITY): Payer: Medicare HMO | Admitting: Nurse Practitioner

## 2015-11-06 ENCOUNTER — Inpatient Hospital Stay (HOSPITAL_COMMUNITY): Admission: RE | Admit: 2015-11-06 | Payer: Medicare HMO | Source: Ambulatory Visit | Admitting: Nurse Practitioner

## 2015-11-14 ENCOUNTER — Encounter (HOSPITAL_COMMUNITY): Payer: Self-pay | Admitting: Nurse Practitioner

## 2015-11-14 ENCOUNTER — Ambulatory Visit (HOSPITAL_COMMUNITY)
Admission: RE | Admit: 2015-11-14 | Discharge: 2015-11-14 | Disposition: A | Discharge status: Home / Self Care | Payer: Medicare HMO | Source: Ambulatory Visit | Attending: Nurse Practitioner | Admitting: Nurse Practitioner

## 2015-11-14 VITALS — BP 134/72 | HR 52 | Ht 68.5 in | Wt 266.2 lb

## 2015-11-14 DIAGNOSIS — R0602 Shortness of breath: Secondary | ICD-10-CM | POA: Insufficient documentation

## 2015-11-14 DIAGNOSIS — Z91041 Radiographic dye allergy status: Secondary | ICD-10-CM | POA: Insufficient documentation

## 2015-11-14 DIAGNOSIS — Z6839 Body mass index (BMI) 39.0-39.9, adult: Secondary | ICD-10-CM | POA: Insufficient documentation

## 2015-11-14 DIAGNOSIS — E559 Vitamin D deficiency, unspecified: Secondary | ICD-10-CM | POA: Insufficient documentation

## 2015-11-14 DIAGNOSIS — I4819 Other persistent atrial fibrillation: Secondary | ICD-10-CM

## 2015-11-14 DIAGNOSIS — Z888 Allergy status to other drugs, medicaments and biological substances status: Secondary | ICD-10-CM | POA: Diagnosis not present

## 2015-11-14 DIAGNOSIS — K219 Gastro-esophageal reflux disease without esophagitis: Secondary | ICD-10-CM | POA: Insufficient documentation

## 2015-11-14 DIAGNOSIS — I110 Hypertensive heart disease with heart failure: Secondary | ICD-10-CM | POA: Diagnosis not present

## 2015-11-14 DIAGNOSIS — Z79899 Other long term (current) drug therapy: Secondary | ICD-10-CM | POA: Diagnosis not present

## 2015-11-14 DIAGNOSIS — Z7901 Long term (current) use of anticoagulants: Secondary | ICD-10-CM | POA: Insufficient documentation

## 2015-11-14 DIAGNOSIS — Z880 Allergy status to penicillin: Secondary | ICD-10-CM | POA: Insufficient documentation

## 2015-11-14 DIAGNOSIS — I4891 Unspecified atrial fibrillation: Secondary | ICD-10-CM | POA: Diagnosis not present

## 2015-11-14 DIAGNOSIS — E039 Hypothyroidism, unspecified: Secondary | ICD-10-CM | POA: Insufficient documentation

## 2015-11-14 DIAGNOSIS — J449 Chronic obstructive pulmonary disease, unspecified: Secondary | ICD-10-CM | POA: Insufficient documentation

## 2015-11-14 DIAGNOSIS — Z823 Family history of stroke: Secondary | ICD-10-CM | POA: Insufficient documentation

## 2015-11-14 DIAGNOSIS — I481 Persistent atrial fibrillation: Secondary | ICD-10-CM

## 2015-11-14 DIAGNOSIS — F419 Anxiety disorder, unspecified: Secondary | ICD-10-CM | POA: Diagnosis not present

## 2015-11-14 DIAGNOSIS — E119 Type 2 diabetes mellitus without complications: Secondary | ICD-10-CM | POA: Diagnosis not present

## 2015-11-14 DIAGNOSIS — Z8249 Family history of ischemic heart disease and other diseases of the circulatory system: Secondary | ICD-10-CM | POA: Diagnosis not present

## 2015-11-14 DIAGNOSIS — Z794 Long term (current) use of insulin: Secondary | ICD-10-CM | POA: Diagnosis not present

## 2015-11-14 DIAGNOSIS — I5030 Unspecified diastolic (congestive) heart failure: Secondary | ICD-10-CM | POA: Insufficient documentation

## 2015-11-14 NOTE — Addendum Note (Signed)
Encounter addended by: Sherran Needs, NP on: 11/14/2015  9:59 AM<BR>    Actions taken: LOS modified, Problem List reviewed

## 2015-11-14 NOTE — Progress Notes (Signed)
Patient ID: Kristine [name], female   DOB: 03-24-1945, 70 y.o.   MRN: [id number]     Primary Care Physician: Tivis Ringer, MD Referring Physician: Dr. Rayann Heman Cardiologist: Dr. Jettie Booze Kristine [name] is a 70 y.o. female with a h/o persistent afib, failing flecainide,tikosyn due to prolonged qt and currently on amiodarone. She was hospitalized with pneumonia in early March.She had chest tightness, arm and leg weakness with increased shortness of breath with exertion, relieved with rest after pneumonia.  Myoview obtained and low risk.  She said that these symptoms actually started prior to getting sick with URI.   She returns 5/11 and is feeling better. She thinks the remnants of having pneumonia were contributing to symptoms when last seen. Staying in [place].   F/u afib clinic, feeling better. Going on some trips this summer which she is happy about. Has not noticed any afib. Main c/o is that she was not sleeping well. She was taken off some prior sleep aiids due to long qt, PCP put her on melatonin, but she still wakes up at 2-3 in the am and has to get out of bed for around an hour. She has also been placed on synthroid a few weeks ago. Continues with xarelto for chadsvasc score of at least 5. No bleeding issues.  Today, she denies symptoms of orthopnea, PND, lower extremity edema, dizziness, presyncope, syncope, or neurologic sequela. Positive for shortness of breath,improved but ongoing.The patient is tolerating medications without difficulties and is otherwise without complaint today.   Past Medical History:  Diagnosis Date  . Anxiety   . Asthma   . CHF (congestive heart failure) ([place])   . Chronic bronchitis ([place])   . GERD (gastroesophageal reflux disease)   . High cholesterol   . History of hiatal hernia   . HTN (hypertension)   . Mild aortic sclerosis ([place])   . Morbid obesity ([place])   . Osteoporosis   . Persistent atrial fibrillation ([place])   . Scoliosis   . Spinal stenosis     . Type II diabetes mellitus ([place])   . Vitamin D deficiency    Past Surgical History:  Procedure Laterality Date  . BLADDER SUSPENSION  1980s  . CARDIAC CATHETERIZATION  11/16/09   SMOOTH AND NORMAL  . CARDIOVERSION N/A 01/07/2015   Procedure: CARDIOVERSION;  Surgeon: Skeet Latch, MD;  Location: [facility] ENDOSCOPY;  Service: Cardiovascular;  Laterality: N/A;  . CARDIOVERSION N/A 03/11/2015   Procedure: CARDIOVERSION;  Surgeon: Skeet Latch, MD;  Location: [facility];  Service: Cardiovascular;  Laterality: N/A;  . CARPAL TUNNEL RELEASE Left 1970s  . CATARACT EXTRACTION W/ INTRAOCULAR LENS  IMPLANT, BILATERAL Bilateral ~ 2010  . COLONOSCOPY  08/14/08  . FINGER FRACTURE SURGERY Right 1970s   "ring finger"  . FRACTURE SURGERY    . LAPAROSCOPIC CHOLECYSTECTOMY  1980s  . TUBAL LIGATION    . VAGINAL HYSTERECTOMY  1980    Current Outpatient Prescriptions  Medication Sig Dispense Refill  . ALPRAZolam (XANAX) 0.25 MG tablet Take 0.125-0.25 mg by mouth 2 (two) times daily as needed for anxiety.   0  . amiodarone (PACERONE) 200 MG tablet Take 1 tablet (200 mg total) by mouth daily. 180 tablet 3  . BD PEN NEEDLE NANO U/F 32G X 4 MM MISC Inject 1 each as directed See admin instructions. Use pen needles with insulin pens daily  11  . esomeprazole (NEXIUM) 40 MG capsule Take 40 mg by mouth daily as needed (for acid reflux).     Marland Kitchen  furosemide (LASIX) 80 MG tablet TAKE 1 TABLET (80 MG TOTAL) BY MOUTH 2 (TWO) TIMES DAILY. 180 tablet 3  . gabapentin (NEURONTIN) 100 MG capsule Take 100 mg by mouth at bedtime.     Marland Kitchen HUMALOG KWIKPEN 200 UNIT/ML SOPN Inject 6 Units into the skin 3 (three) times daily with meals.  6  . insulin detemir (LEVEMIR) 100 UNIT/ML injection Inject 50 Units into the skin 2 (two) times daily.    Marland Kitchen KLOR-CON 10 10 MEQ tablet Take 20 mEq by mouth daily.  11  . levothyroxine (SYNTHROID, LEVOTHROID) 25 MCG tablet Take 1 tablet by mouth daily.    Marland Kitchen losartan (COZAAR) 100 MG tablet TAKE  1 TABLET BY MOUTH EVERY DAY 90 tablet 3  . Melatonin 1 MG CAPS Take by mouth at bedtime.    . metFORMIN (GLUCOPHAGE) 500 MG tablet Take 500-1,000 mg by mouth 2 (two) times daily with a meal. 1000 mg in the AM and 500mg  at night    . metolazone (ZAROXOLYN) 2.5 MG tablet Take 2.5 mg by mouth as needed (for shortness of breath).     . metoprolol succinate (TOPROL-XL) 50 MG 24 hr tablet Take 2 tablets by mouth in the morning and 1 tablet by mouth in the pm 270 tablet 3  . Vitamin D, Ergocalciferol, (DRISDOL) 50000 UNITS CAPS Take 50,000 Units by mouth every 7 (seven) days. Takes on Thursday    . XARELTO 20 MG TABS tablet TAKE 1 TABLET BY MOUTH EVERY MORNING 30 tablet 3  . XOPENEX HFA 45 MCG/ACT inhaler Take 1 puff by mouth 4 (four) times daily as needed for wheezing.   6   No current facility-administered medications for this encounter.     Allergies  Allergen Reactions  . Albuterol Other (See Comments)    REACTION: Tachycardia- AFib  . Epinephrine Other (See Comments)    Increases heart rate per patient.   . Penicillins Other (See Comments)    REACTION: flushing \\T \ hot  . Bee Venom Other (See Comments)    "makes me nervous"  . Ivp Dye [Iodinated Diagnostic Agents] Other (See Comments)    flushing    Social History   Social History  . Marital status: Married    Spouse name: N/A  . Number of children: N/A  . Years of education: N/A   Occupational History  . Not on file.   Social History Main Topics  . Smoking status: Never Smoker  . Smokeless tobacco: Never Used  . Alcohol use No  . Drug use: No  . Sexual activity: Yes   Other Topics Concern  . Not on file   Social History Narrative   Pt lives in [place] with spouse.   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    Family History  Problem Relation Age of Onset  . Stroke Father   . Hypertension Father   . Atrial  fibrillation Father     HAD MURMUR  . Heart failure Mother   . Hypertension Brother   . Hypertension Sister   . Hypertension Son   . Congestive Heart Failure Mother   . Hypertension Mother   . CAD Sister     EARLY  . CAD Brother     EARLY    ROS- All systems are reviewed and negative except as per the HPI above  Physical Exam: Vitals:   11/14/15 0846  BP: 134/72  Pulse: (!) 52  Weight: 266 lb 3.2 oz (120.7 kg)  Height: 5' 8.5" (1.74 m)    GEN- The patient is well appearing, alert and oriented x 3 today.   Head- normocephalic, atraumatic Eyes-  Sclera clear, conjunctiva pink Ears- hearing intact Oropharynx- clear Neck- supple, no JVP Lymph- no cervical lymphadenopathy Lungs- Clear to ausculation bilaterally, normal work of breathing Heart- Regular rate and rhythm, no murmurs, rubs or gallops, PMI not laterally displaced GI- soft, NT, ND, + BS Extremities- no clubbing, cyanosis, or edema MS- no significant deformity or atrophy Skin- no rash or lesion Psych- euthymic mood, full affect Neuro- strength and sensation are intact  EKG- Sinus brady with HR of  52, RAD,IRBBB, pr int 166 ms, qrs 98 ms, qtc 502 ms Epic records reviewed Echo- 1/12 Left ventricle: The cavity size was normal. Wall thickness was  increased increased in a pattern of mild to moderate LVH.  Systolic function was normal. The estimated ejection fraction was  in the range of 55% to 60%. Biplane LVEF 56.8% by speckle  tracking. Normal global longitudinal strain of -17.1%. Wall  motion was normal; there were no regional wall motion  abnormalities. Features are consistent with a pseudonormal left  ventricular filling pattern, with concomitant abnormal relaxation  and increased filling pressure (grade 2 diastolic dysfunction). - Aortic valve: Trileaflet; mildly to moderately calcified  leaflets. - Mitral valve: Calcified annulus. There was mild regurgitation. - Left atrium: The atrium was mildly  dilated. - Right ventricle: The cavity size was mildly dilated. - Right atrium: The atrium was at the upper limits of normal in  size. Central venous pressure (est): 3 mm Hg. - Atrial septum: No defect or patent foramen ovale was identified. - Tricuspid valve: There was trivial regurgitation. - Pulmonary arteries: Systolic pressure could not be accurately  estimated. Incomplete tricuspid regurgitant Doppler envelope. - Pericardium, extracardiac: A trivial pericardial effusion was  identified.  Impressions:  - Mild to moderate LVH with LVEF 55-60% and grade 2 diastolic  dysfunction with increased filling pressures. Mild left atrial  enlargement. Mild MAC with mild mitral regurgitation. Sclerotic  aortic valve without stenosis. Mildly dilated RV with normal  contraction. Trivial tricuspid regurgitation, unable to  accurately assess PASP. Trivial pericardial effusion.  Lexi myoview 07/26/15 Study Highlights     The left ventricular ejection fraction is normal (55-65%).  Nuclear stress EF: 59%.  There was no ST segment deviation noted during stress.  This is a low risk study.     Assessment and Plan: 1. Afib  In SR on amiodarone Continue amiodarone at 200 mg a day as well as metoprolol   2.Shortness of breath, improved but not resolved Lexi myoview low risk PFT's   3. Diastolic heart failure  Lasix 80 mg bid Does not appear fluid overloaded/weight stable Continue daily weights  4. HTN controlled   5. Hypothyroidism Started on synthroid last couple of weeks Per pcp   afib clinic in 3 months F/u Dr. Cathie Olden this fall as scheduled  Kristine [name]. [place], [facility] [address] [place], [place] [address] [phone]

## 2015-11-14 NOTE — Patient Instructions (Signed)
Your physician has recommended that you have a pulmonary function test. Pulmonary Function Tests are a group of tests that measure how well air moves in and out of your lungs.  This is scheduled for 11/19/15 Tuesday at 11:00 a.m. No smoking, caffeine or inhalers 4 hours prior to testing.  Please arrive at [facility] and check in at admitting

## 2015-11-19 ENCOUNTER — Ambulatory Visit (HOSPITAL_COMMUNITY)
Admission: RE | Admit: 2015-11-19 | Discharge: 2015-11-19 | Disposition: A | Discharge status: Home / Self Care | Payer: Medicare HMO | Source: Ambulatory Visit | Attending: Nurse Practitioner | Admitting: Nurse Practitioner

## 2015-11-19 DIAGNOSIS — I481 Persistent atrial fibrillation: Secondary | ICD-10-CM | POA: Diagnosis not present

## 2015-11-19 DIAGNOSIS — I4819 Other persistent atrial fibrillation: Secondary | ICD-10-CM

## 2015-11-19 LAB — PULMONARY FUNCTION TEST
DL/VA % PRED: 71 %
DL/VA: 3.76 ml/min/mmHg/L
DLCO UNC: 15.14 ml/min/mmHg
DLCO unc % pred: 49 %
FEF 25-75 Pre: 1.56 L/sec
FEF2575-%Pred-Pre: 69 %
FEV1-%Pred-Pre: 69 %
FEV1-PRE: 1.92 L
FEV1FVC-%Pred-Pre: 101 %
FEV6-%PRED-PRE: 71 %
FEV6-PRE: 2.47 L
FEV6FVC-%PRED-PRE: 103 %
FVC-%Pred-Pre: 69 %
FVC-Pre: 2.5 L
PRE FEV1/FVC RATIO: 77 %
Pre FEV6/FVC Ratio: 99 %
RV % PRED: 81 %
RV: 1.96 L
TLC % pred: 82 %
TLC: 4.74 L

## 2015-11-21 ENCOUNTER — Other Ambulatory Visit (HOSPITAL_COMMUNITY): Payer: Self-pay | Admitting: *Deleted

## 2015-11-21 DIAGNOSIS — R942 Abnormal results of pulmonary function studies: Secondary | ICD-10-CM

## 2015-12-04 DIAGNOSIS — I10 Essential (primary) hypertension: Secondary | ICD-10-CM | POA: Diagnosis not present

## 2015-12-04 DIAGNOSIS — E1159 Type 2 diabetes mellitus with other circulatory complications: Secondary | ICD-10-CM | POA: Diagnosis not present

## 2015-12-04 DIAGNOSIS — Z6841 Body Mass Index (BMI) 40.0 and over, adult: Secondary | ICD-10-CM | POA: Diagnosis not present

## 2015-12-04 DIAGNOSIS — N183 Chronic kidney disease, stage 3 (moderate): Secondary | ICD-10-CM | POA: Diagnosis not present

## 2015-12-04 DIAGNOSIS — Z23 Encounter for immunization: Secondary | ICD-10-CM | POA: Diagnosis not present

## 2015-12-04 DIAGNOSIS — I509 Heart failure, unspecified: Secondary | ICD-10-CM | POA: Diagnosis not present

## 2015-12-11 ENCOUNTER — Encounter: Payer: Self-pay | Admitting: Cardiovascular Disease

## 2015-12-17 ENCOUNTER — Institutional Professional Consult (permissible substitution): Payer: Medicare HMO | Admitting: Pulmonary Disease

## 2015-12-19 ENCOUNTER — Ambulatory Visit: Payer: Medicare HMO | Admitting: Nutrition

## 2015-12-26 ENCOUNTER — Ambulatory Visit: Payer: Medicare HMO | Admitting: Cardiovascular Disease

## 2016-01-02 ENCOUNTER — Encounter: Payer: Medicare HMO | Attending: Internal Medicine | Admitting: Nutrition

## 2016-01-02 ENCOUNTER — Encounter: Payer: Self-pay | Admitting: Nutrition

## 2016-01-02 VITALS — Ht 69.0 in | Wt 266.0 lb

## 2016-01-02 DIAGNOSIS — N289 Disorder of kidney and ureter, unspecified: Secondary | ICD-10-CM | POA: Diagnosis not present

## 2016-01-02 DIAGNOSIS — E118 Type 2 diabetes mellitus with unspecified complications: Secondary | ICD-10-CM

## 2016-01-02 DIAGNOSIS — Z794 Long term (current) use of insulin: Secondary | ICD-10-CM

## 2016-01-02 DIAGNOSIS — Z713 Dietary counseling and surveillance: Secondary | ICD-10-CM | POA: Diagnosis not present

## 2016-01-02 DIAGNOSIS — E785 Hyperlipidemia, unspecified: Secondary | ICD-10-CM | POA: Insufficient documentation

## 2016-01-02 DIAGNOSIS — I10 Essential (primary) hypertension: Secondary | ICD-10-CM | POA: Insufficient documentation

## 2016-01-02 DIAGNOSIS — E1165 Type 2 diabetes mellitus with hyperglycemia: Secondary | ICD-10-CM

## 2016-01-02 DIAGNOSIS — E1159 Type 2 diabetes mellitus with other circulatory complications: Secondary | ICD-10-CM | POA: Diagnosis not present

## 2016-01-02 DIAGNOSIS — IMO0002 Reserved for concepts with insufficient information to code with codable children: Secondary | ICD-10-CM

## 2016-01-02 NOTE — Progress Notes (Signed)
  Medical Nutrition Therapy:  Appt start time: 1000 end time:  1030.  Assessment:  Primary concerns today:  A1C 7.1% down from 8.1%.  50 units BID plus 12 units TID . Not been using sliding scale. BS are high due to not using sliding scale.. Meter brought in..   Trying to eat better. BS are much better overall. Willing to start using sliding scale. Didn't remember how to use it and lost paperwork.   Trying to cut down on salt and processed foods Diet needs more lower carb vegetables.  Preferred Learning Style:   No preference indicated   Learning Readiness:   Ready  MEDICATIONS: see list    DIETARY INTAKE:  24-hr recall:  B ( AM)  PB sandwich and LS V 8 Snk ( AM): grapes, water  L ( PM):   Sandwich or salad;   Snk   ( PM):   D ( PM): Tomato sandwich and walnuts and grapes.    Snk ( PM):    Usual physical activity: walks or plays ball with grandchildren for 10 minutes 3 x day   Estimated energy needs: 1600 calories 180 g carbohydrates 120 g protein 44 g fat  Progress Towards Goal(s):  In progress.   Nutritional Diagnosis:  NI-5.10.2 Excessive mineral intake (specify): sodium As related to related fluid retention from congestive heart failure.  As evidenced by diet recall of frequent high sodium meals.  Nutritional Diagnosis:  [place]-2.1 Inpaired nutrition utilization As related to glucose metabolism.  As evidenced by Hgb A1c of 8.2%.    Intervention:  Nutrition counseling provided.   Goals 1. Cut out snacks between meals and after supper 2. Avoid foods with more than 200 mg of sodium per serving. 3. Increase low carb vegetables; 2 servings with lunch and dinner. Walk 30 minutes a day  Teaching Method Utilized:  Visual Auditory Hands on  Handouts given during visit include:  Low Sodium MNT  1500 calorie Meal Plan  Low sodium flavoring tips  Barriers to learning/adherence to lifestyle change: none  Demonstrated degree of understanding via:  Teach Back    Monitoring/Evaluation:  Dietary intake, exercise, and body weight in 2 month(s).

## 2016-01-02 NOTE — Patient Instructions (Addendum)
Goals 1. Cut out snacks between meals and after supper 2. Avoid foods with more than 200 mg of sodium per serving. 3. Increase low carb vegetables; 2 servings with lunch and dinner. Walk 30 minutes a day

## 2016-01-05 ENCOUNTER — Other Ambulatory Visit: Payer: Self-pay | Admitting: Cardiovascular Disease

## 2016-01-16 ENCOUNTER — Ambulatory Visit (INDEPENDENT_AMBULATORY_CARE_PROVIDER_SITE_OTHER): Payer: Medicare HMO | Admitting: Pulmonary Disease

## 2016-01-16 ENCOUNTER — Encounter: Payer: Self-pay | Admitting: Pulmonary Disease

## 2016-01-16 DIAGNOSIS — T462X4A Poisoning by other antidysrhythmic drugs, undetermined, initial encounter: Secondary | ICD-10-CM

## 2016-01-16 DIAGNOSIS — T462X1A Poisoning by other antidysrhythmic drugs, accidental (unintentional), initial encounter: Secondary | ICD-10-CM | POA: Insufficient documentation

## 2016-01-16 HISTORY — DX: Poisoning by other antidysrhythmic drugs, accidental (unintentional), initial encounter: T46.2X1A

## 2016-01-16 NOTE — Progress Notes (Signed)
Kristine [name]    [id number]    July 09, 1945  Primary Care [address], MD  Referring Physician: Prince Solian, MD [address] [place], [place] [address]  Chief complaint:  Consult for evaluation of abnormal PFTs.  HPI: Kristine [name] is a 70 year old with past medical history of atrial fibrillation, diastolic heart failure, diabetes mellitus. She had been previously evaluated by Dr. Annamaria Boots in 2012 for dyspnea on exertion. This was thought to be secondary to heart failure from atrial fibrillation, diastolic heart failure. She has a history of recurrent pneumonias with remote hemoptysis in the setting of anticoagulation.   She has hard to control atrial fibrillation. She had failed flecainide, tikosyn due to prolonged QT and has been on amiodarone since March of 2017. She's been in sinus rhythm since summer of 2017. She complains of persistent dyspnea on exertion. She does not have any dyspnea at rest or wheezing. She denies any cough, sputum production, fevers, chills. She reports an reaction to albuterol with palpitations and tachycardia. 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 She does not have any smoking history or relevant exposures.   Outpatient Encounter Prescriptions as of 01/16/2016  Medication Sig  . ALPRAZolam (XANAX) 0.25 MG tablet Take 0.125-0.25 mg by mouth 2 (two) times daily as needed for anxiety.   Marland Kitchen amiodarone (PACERONE) 200 MG tablet Take 1 tablet (200 mg total) by mouth daily.  . BD PEN NEEDLE NANO U/F 32G X 4 MM MISC Inject 1 each as directed See admin instructions. Use pen needles with insulin pens daily  . esomeprazole (NEXIUM) 40 MG capsule Take 40 mg by mouth daily as needed (for acid reflux).   . furosemide (LASIX) 80 MG tablet TAKE 1 TABLET (80 MG TOTAL) BY MOUTH 2 (TWO) TIMES DAILY.  Marland Kitchen gabapentin (NEURONTIN) 100 MG capsule Take 100 mg by mouth at bedtime.   Marland Kitchen HUMALOG  KWIKPEN 200 UNIT/ML SOPN Inject 6 Units into the skin 3 (three) times daily with meals.  . insulin detemir (LEVEMIR) 100 UNIT/ML injection Inject 50 Units into the skin 2 (two) times daily.  Marland Kitchen KLOR-CON 10 10 MEQ tablet Take 20 mEq by mouth daily.  Marland Kitchen levothyroxine (SYNTHROID, LEVOTHROID) 25 MCG tablet Take 1 tablet by mouth daily.  Marland Kitchen losartan (COZAAR) 100 MG tablet TAKE 1 TABLET BY MOUTH EVERY DAY  . Melatonin 1 MG CAPS Take by mouth at bedtime.  . metFORMIN (GLUCOPHAGE) 500 MG tablet Take 500-1,000 mg by mouth 2 (two) times daily with a meal. 1000 mg in the AM and 500mg  at night  . metolazone (ZAROXOLYN) 2.5 MG tablet Take 2.5 mg by mouth as needed (for shortness of breath).   . metoprolol succinate (TOPROL-XL) 50 MG 24 hr tablet Take 2 tablets by mouth in the morning and 1 tablet by mouth in the pm  . rivaroxaban (XARELTO) 20 MG TABS tablet Take 1 tablet (20 mg total) by mouth daily.  . Vitamin D, Ergocalciferol, (DRISDOL) 50000 UNITS CAPS Take 50,000 Units by mouth every 7 (seven) days. Takes on Thursday  . XOPENEX HFA 45 MCG/ACT inhaler Take 1 puff by mouth 4 (four) times daily as needed for wheezing.    No facility-administered encounter medications on file as of 01/16/2016.     Allergies as of 01/16/2016 - Review Complete 01/16/2016  Allergen Reaction Noted  . Albuterol Other (See Comments) 05/12/2010  . Epinephrine Other (See Comments)  11/16/2013  . Penicillins Other (See Comments) 07/25/2008  . Bee venom Other (See Comments) 05/27/2010  . Ivp dye [iodinated diagnostic agents] Other (See Comments) 05/05/2012    Past Medical History:  Diagnosis Date  . Anxiety   . Asthma   . CHF (congestive heart failure) ([place])   . Chronic bronchitis ([place])   . GERD (gastroesophageal reflux disease)   . High cholesterol   . History of hiatal hernia   . HTN (hypertension)   . Mild aortic sclerosis ([place])   . Morbid obesity ([place])   . Osteoporosis   . Persistent atrial fibrillation ([place])   .  Scoliosis   . Spinal stenosis   . Type II diabetes mellitus ([place])   . Vitamin D deficiency     Past Surgical History:  Procedure Laterality Date  . BLADDER SUSPENSION  1980s  . CARDIAC CATHETERIZATION  11/16/09   SMOOTH AND NORMAL  . CARDIOVERSION N/A 01/07/2015   Procedure: CARDIOVERSION;  Surgeon: Skeet Latch, MD;  Location: [facility] ENDOSCOPY;  Service: Cardiovascular;  Laterality: N/A;  . CARDIOVERSION N/A 03/11/2015   Procedure: CARDIOVERSION;  Surgeon: Skeet Latch, MD;  Location: [facility];  Service: Cardiovascular;  Laterality: N/A;  . CARPAL TUNNEL RELEASE Left 1970s  . CATARACT EXTRACTION W/ INTRAOCULAR LENS  IMPLANT, BILATERAL Bilateral ~ 2010  . COLONOSCOPY  08/14/08  . FINGER FRACTURE SURGERY Right 1970s   "ring finger"  . FRACTURE SURGERY    . LAPAROSCOPIC CHOLECYSTECTOMY  1980s  . TUBAL LIGATION    . VAGINAL HYSTERECTOMY  1980    Family History  Problem Relation Age of Onset  . Stroke Father   . Hypertension Father   . Atrial fibrillation Father     HAD MURMUR  . Heart failure Mother   . Congestive Heart Failure Mother   . Hypertension Mother   . Hypertension Brother   . Hypertension Sister   . Hypertension Son   . CAD Sister     EARLY  . CAD Brother     EARLY    Social History   Social History  . Marital status: Married    Spouse name: N/A  . Number of children: N/A  . Years of education: N/A   Occupational History  . Not on file.   Social History Main Topics  . Smoking status: Never Smoker  . Smokeless tobacco: Never Used  . Alcohol use No  . Drug use: No  . Sexual activity: Yes   Other Topics Concern  . Not on file   Social History Narrative   Pt lives in [place] with spouse.   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     Review of systems: Review of Systems  Constitutional: Negative for fever and chills.  HENT: Negative.     Eyes: Negative for blurred vision.  Respiratory: as per HPI  Cardiovascular: Negative for chest pain and palpitations.  Gastrointestinal: Negative for vomiting, diarrhea, blood per rectum. Genitourinary: Negative for dysuria, urgency, frequency and hematuria.  Musculoskeletal: Negative for myalgias, back pain and joint pain.  Skin: Negative for itching and rash.  Neurological: Negative for dizziness, tremors, focal weakness, seizures and loss of consciousness.  Endo/Heme/Allergies: Negative for environmental allergies.  Psychiatric/Behavioral: Negative for depression, suicidal ideas and hallucinations.  All other systems reviewed and are negative.   Physical Exam: Blood pressure 124/68, pulse (!) 57, height 5\' 9"  (1.753 m),  weight 263 lb (119.3 kg), SpO2 90 %. Gen:      No acute distress HEENT:  EOMI, sclera anicteric Neck:     No masses; no thyromegaly Lungs:    Clear to auscultation bilaterally; normal respiratory effort CV:         Regular rate and rhythm; no murmurs Abd:      + bowel sounds; soft, non-tender; no palpable masses, no distension Ext:    No edema; adequate peripheral perfusion Skin:      Warm and dry; no rash Neuro: alert and oriented x 3 Psych: normal mood and affect  Data Reviewed: Echo 04/04/15 Mild to moderate LVH with LVEF 55-60% and grade 2 diastolic   dysfunction with increased filling pressures. Mild left atrial   enlargement. Mild MAC with mild mitral regurgitation. Sclerotic   aortic valve without stenosis. Mildly dilated RV with normal   contraction. Trivial tricuspid regurgitation, unable to   accurately assess PASP. Trivial pericardial effusion.  CXR 05/30/15- RUL infiltrate. Images reviewed  PFT-12/05/09- Mild restriction TLC 73%, DLCO 69%, Spirometry WNL w/ resp to BD 6MWT-12/05/09- 100%, 98%, 98% 368 m.  PFTs 11/19/15 FVC 3.61 (69%) FEV1 2.75 (69%) F/F 76 TLC 82% DLCO 49% Moderate-severe diffusion defect.  Assessment:  Evaluation for  dyspnea on exertion, abnormal PFTs.  Her PFTs were reviewed with her today. They did not show any obstruction to suggest either COPD or emphysema. There is an isolated reduction in diffusion capacity that corrects for alveolar volume. Compared to 2011 there is worsening of her diffusion capacity. She has been on amiodarone for the past year and will need evaluation for amiodarone toxicity. I'll schedule her for high res CT of the chest for further evaluation.  I suspect her dyspnea is multifactorial secondary to her obesity, deconditioning and chronic heart failure, A. fib issues. I don't believe she'll need to be on any inhalers right now. We will review her CT scan and plan for further workup as needed. If her symptoms persist she may benefit from a cardiac pulmonary exercise stress test.  Plan/Recommendations: - High res CT  Return in 1 month.  Marshell Garfinkel MD [facility] Pager [phone] 01/16/2016, 9:12 AM  CC: Prince Solian, MD

## 2016-01-16 NOTE — Patient Instructions (Signed)
We will schedule you for a high res CT of the chest. Your PFTs were reviewed with you today. Return to clinic in 1 month

## 2016-01-16 NOTE — Progress Notes (Deleted)
   Subjective:    Patient ID: Kristine [name], female    DOB: 06/09/1945, 70 y.o.   MRN: [id number]  HPI    Review of Systems  Constitutional: Negative for fever and unexpected weight change.  HENT: Negative for congestion, dental problem, ear pain, nosebleeds, postnasal drip, rhinorrhea, sinus pressure, sneezing, sore throat and trouble swallowing.   Eyes: Negative for redness and itching.  Respiratory: Positive for chest tightness, shortness of breath and wheezing. Negative for cough.   Cardiovascular: Negative for palpitations and leg swelling.  Gastrointestinal: Negative for nausea and vomiting.  Genitourinary: Negative for dysuria.  Musculoskeletal: Negative for joint swelling.  Skin: Negative for rash.  Neurological: Negative for headaches.  Hematological: Bruises/bleeds easily.  Psychiatric/Behavioral: Negative for dysphoric mood. The patient is not nervous/anxious.        Objective:   Physical Exam        Assessment & Plan:

## 2016-01-23 ENCOUNTER — Ambulatory Visit (INDEPENDENT_AMBULATORY_CARE_PROVIDER_SITE_OTHER)
Admission: RE | Admit: 2016-01-23 | Discharge: 2016-01-23 | Disposition: A | Discharge status: Home / Self Care | Payer: Medicare HMO | Source: Ambulatory Visit | Attending: Pulmonary Disease | Admitting: Pulmonary Disease

## 2016-01-23 ENCOUNTER — Encounter: Payer: Self-pay | Admitting: Cardiovascular Disease

## 2016-01-23 ENCOUNTER — Ambulatory Visit (INDEPENDENT_AMBULATORY_CARE_PROVIDER_SITE_OTHER): Payer: Medicare HMO | Admitting: Cardiovascular Disease

## 2016-01-23 DIAGNOSIS — I481 Persistent atrial fibrillation: Secondary | ICD-10-CM | POA: Diagnosis not present

## 2016-01-23 DIAGNOSIS — R0602 Shortness of breath: Secondary | ICD-10-CM

## 2016-01-23 DIAGNOSIS — T462X4A Poisoning by other antidysrhythmic drugs, undetermined, initial encounter: Secondary | ICD-10-CM

## 2016-01-23 DIAGNOSIS — I4819 Other persistent atrial fibrillation: Secondary | ICD-10-CM

## 2016-01-23 MED ORDER — METOPROLOL SUCCINATE ER 50 MG PO TB24: 50.0000 mg | ORAL_TABLET | Freq: Two times a day (BID) | ORAL | 3 refills | Status: DC

## 2016-01-23 MED ORDER — AMIODARONE HCL 200 MG PO TABS: ORAL_TABLET | ORAL | 3 refills | Status: DC

## 2016-01-23 NOTE — Progress Notes (Signed)
Cardiology Office Note   Date:  01/23/2016   ID:  Kristine [name], DOB 06/12/1945, MRN [id number]  PCP:  Tivis Ringer, MD  Cardiologist:   Mertie Moores, MD   Chief Complaint  Patient presents with  . Follow-up    atrial fib   1. Atrial fibrillation 2. Diabetes mellitus 3. Chronic diastolic CHF 4. Obesity.   History of Present Illness:  Kristine [name] is a 70 year old female with a history of atrial fibrillation. She has done well since I last saw her. She's not had any episodes of chest pain or shortness breath. Her blood pressure readings have been normal.  She is tolerating the Xarelto fairly well. She has some occasional bleeding from the gums. She enjoys not having to check her INR levels.   She has occasional palpitations and heart racing. This typically can rest and these will resolve. She is able to wak about 10 minutes at a time.   Oct. 24, 2014:  June 23, 2013:  Kristine [name]'s BP is elevated. She had a recent echocardiogram that revealed a left ventricle systolic function that was at the lower limits of normal-50%. Study Conclusions  - Left ventricle: The cavity size was normal. Wall thickness was increased in a pattern of mild LVH. The estimated ejection fraction was 50%. Diffuse hypokinesis. Features are consistent with a pseudonormal left ventricular filling pattern, with concomitant abnormal relaxation and increased filling pressure (grade 2 diastolic dysfunction). - Aortic valve: There was no stenosis. - Mitral valve: Mildly calcified annulus. Mildly calcified leaflets . Trivial regurgitation. - Left atrium: The atrium was moderately dilated. - Right ventricle: The cavity size was normal. Systolic function was mildly reduced. - Right atrium: The atrium was moderately dilated. - Systemic veins: IVC measured 1.7 cm with < 50% respirophasic variation, suggesting RA pressure 8 mmHg. - Pericardium, extracardiac: A trivial pericardial effusion was  identified posterior to the heart  She was having some abdominal pain and   September 29, 2013:  Kristine [name] is doing well. Stressed about coming here.   Jan. 7, 2015:  Kristine [name] is a 70 yo with hx of Afib and diastolic dysfunction She converted to NSR in the hospital with flecainide. She was tried on [place] but her QT interval prolonged. Was changed to Flecainide and has maintained NSR.  She is exercising regularly - 30 minutes a day. Is on Lasix 80 mg a day and mtalazone 2. 5 once a week. Is still retaining fluid. Trying to avoid salt in food.    June 11, 2014:   Kristine [name] is a 70 y.o. female who presents for follow-up of her atrial fibrillation. She has had some issues with fluid retention and  has been on metalazone.   Sept. 19, 2016:  Doing well.  Has had some shortness of breath - improved with increasing lasix to 80 BID .  BP is still high this am.  BP at home has been normal .  June 18, 2015:  She has tried [place] but had prolonged QT - so it was stopped Tried on flecainide Was tried on amiodarone  Had a cardioversion in Dec.   Amiodarone dose was lowered to 200 mg a day and she is now back in atrial fib She is not as symptomatic today  She is very fatigued. Has not been sleeping well at all Has lots of arm fatigue in her arms with any activity   Nov. 2, 2017:    Still short of breath No CP  Active,  Has  a pins and needle sensation      Past Medical History:  Diagnosis Date  . Anxiety   . Asthma   . CHF (congestive heart failure) ([place])   . Chronic bronchitis ([place])   . GERD (gastroesophageal reflux disease)   . High cholesterol   . History of hiatal hernia   . HTN (hypertension)   . Mild aortic sclerosis ([place])   . Morbid obesity ([place])   . Osteoporosis   . Persistent atrial fibrillation ([place])   . Scoliosis   . Spinal stenosis   . Type II diabetes mellitus ([place])   . Vitamin D deficiency     Past Surgical History:  Procedure Laterality Date    . BLADDER SUSPENSION  1980s  . CARDIAC CATHETERIZATION  11/16/09   SMOOTH AND NORMAL  . CARDIOVERSION N/A 01/07/2015   Procedure: CARDIOVERSION;  Surgeon: Skeet Latch, MD;  Location: [facility] ENDOSCOPY;  Service: Cardiovascular;  Laterality: N/A;  . CARDIOVERSION N/A 03/11/2015   Procedure: CARDIOVERSION;  Surgeon: Skeet Latch, MD;  Location: [place];  Service: Cardiovascular;  Laterality: N/A;  . CARPAL TUNNEL RELEASE Left 1970s  . CATARACT EXTRACTION W/ INTRAOCULAR LENS  IMPLANT, BILATERAL Bilateral ~ 2010  . COLONOSCOPY  08/14/08  . FINGER FRACTURE SURGERY Right 1970s   "ring finger"  . FRACTURE SURGERY    . LAPAROSCOPIC CHOLECYSTECTOMY  1980s  . TUBAL LIGATION    . VAGINAL HYSTERECTOMY  1980     Current Outpatient Prescriptions  Medication Sig Dispense Refill  . ALPRAZolam (XANAX) 0.25 MG tablet Take 0.125-0.25 mg by mouth 2 (two) times daily as needed for anxiety.   0  . amiodarone (PACERONE) 200 MG tablet Take 1 tablet (200 mg total) by mouth daily. 180 tablet 3  . BD PEN NEEDLE NANO U/F 32G X 4 MM MISC Inject 1 each as directed See admin instructions. Use pen needles with insulin pens daily  11  . esomeprazole (NEXIUM) 40 MG capsule Take 40 mg by mouth daily as needed (for acid reflux).     . furosemide (LASIX) 80 MG tablet TAKE 1 TABLET (80 MG TOTAL) BY MOUTH 2 (TWO) TIMES DAILY. 180 tablet 3  . gabapentin (NEURONTIN) 100 MG capsule Take 100 mg by mouth at bedtime.     Marland Kitchen HUMALOG KWIKPEN 200 UNIT/ML SOPN Inject 12 Units into the skin 3 (three) times daily with meals.   6  . insulin detemir (LEVEMIR) 100 UNIT/ML injection Inject 54 Units into the skin 2 (two) times daily.     Marland Kitchen KLOR-CON 10 10 MEQ tablet Take 20 mEq by mouth daily.  11  . levothyroxine (SYNTHROID, LEVOTHROID) 25 MCG tablet Take 1 tablet by mouth daily.    Marland Kitchen losartan (COZAAR) 100 MG tablet TAKE 1 TABLET BY MOUTH EVERY DAY 90 tablet 3  . Melatonin 1 MG CAPS Take by mouth at bedtime.    . metFORMIN  (GLUCOPHAGE) 500 MG tablet Take 500-1,000 mg by mouth 2 (two) times daily with a meal. 1000 mg in the AM and 500mg  at night    . metolazone (ZAROXOLYN) 2.5 MG tablet Take 2.5 mg by mouth as needed (for shortness of breath).     . metoprolol succinate (TOPROL-XL) 50 MG 24 hr tablet Take 2 tablets by mouth in the morning and 1 tablet by mouth in the pm 270 tablet 3  . rivaroxaban (XARELTO) 20 MG TABS tablet Take 1 tablet (20 mg total) by mouth daily. 30 tablet 4  . Vitamin D, Ergocalciferol, (DRISDOL)  50000 UNITS CAPS Take 50,000 Units by mouth every 7 (seven) days. Takes on Thursday    . XOPENEX HFA 45 MCG/ACT inhaler Take 1 puff by mouth 4 (four) times daily as needed for wheezing.   6   No current facility-administered medications for this visit.     Allergies:   Albuterol; Epinephrine; Penicillins; Bee venom; and Ivp dye [iodinated diagnostic agents]    Social History:  The patient  reports that she has never smoked. She has never used smokeless tobacco. She reports that she does not drink alcohol or use drugs.   Family History:  The patient's family history includes Atrial fibrillation in her father; CAD in her brother and sister; Congestive Heart Failure in her mother; Heart failure in her mother; Hypertension in her brother, father, mother, sister, and son; Stroke in her father.    ROS:  Please see the history of present illness.    Review of Systems: Constitutional:  denies fever, chills, diaphoresis, appetite change and fatigue.  HEENT: denies photophobia, eye pain, redness, hearing loss, ear pain, congestion, sore throat, rhinorrhea, sneezing, neck pain, neck stiffness and tinnitus.  Respiratory: denies SOB, DOE, cough, chest tightness, and wheezing.  Cardiovascular: denies chest pain, palpitations and leg swelling.  Gastrointestinal: denies nausea, vomiting, abdominal pain, diarrhea, constipation, blood in stool.  Genitourinary: denies dysuria, urgency, frequency, hematuria, flank  pain and difficulty urinating.  Musculoskeletal: denies  myalgias, back pain, joint swelling, arthralgias and gait problem.   Skin: denies pallor, rash and wound.  Neurological: denies dizziness, seizures, syncope, weakness, light-headedness, numbness and headaches.   Hematological: denies adenopathy, easy bruising, personal or family bleeding history.  Psychiatric/ Behavioral: denies suicidal ideation, mood changes, confusion, nervousness, sleep disturbance and agitation.       All other systems are reviewed and negative.    PHYSICAL EXAM: VS:  BP 120/62   Pulse 73   Ht 5\' 9"  (1.753 m)   Wt 266 lb (120.7 kg)   BMI 39.28 kg/m  , BMI Body mass index is 39.28 kg/m. GEN: Well nourished, well developed, in no acute distress  HEENT: normal  Neck: no JVD, carotid bruits, or masses Cardiac: Irreg. Irreg. ; no murmurs, rubs, or gallops, trace - 1+ edema , with chronic stasis changes.  Respiratory:  clear to auscultation bilaterally, normal work of breathing GI: soft, nontender, nondistended, + BS MS: no deformity or atrophy  Skin: warm and dry, no rash Neuro:  Strength and sensation are intact Psych: normal   EKG:  EKG is ordered today. Nov. 2, 2017:   Sinus brady at 46.   NS ST abn.    Recent Labs: 07/03/2015: ALT 37; BUN 22; Creatinine, Ser 1.61; Hemoglobin 11.1; Platelets 197; Potassium 3.5; Sodium 141; TSH 4.866    Lipid Panel No results found for: CHOL, TRIG, HDL, CHOLHDL, VLDL, LDLCALC, LDLDIRECT    Wt Readings from Last 3 Encounters:  01/23/16 266 lb (120.7 kg)  01/16/16 263 lb (119.3 kg)  01/02/16 266 lb (120.7 kg)      Other studies Reviewed: Additional studies/ records that were reviewed today include: . Review of the above records demonstrates:    ASSESSMENT AND PLAN:  1. Atrial fibrillation -      She has shortness of breath  Will continue anticoagulation and rate control  Has failed Flecainide, tikosyn and now  Is on amiodarone.   Will reduce her  amio to 200 mg 3 times a week.   High resolution CT of the chest today shows  no specific findings of Amiodarone toxicity. We will continue with as low a dose as possible for her amiodarone.  We discussed the fact that we may have to change our treatment goals to rate control and anticoagulation and concentrate less on rhythm control if she starts having symptoms consistent with amiodarone toxicity.  Had PFTs in Aug. 2017  Will continue to see Dr. Rayann Heman / Roderic Palau, NP    2. Diabetes mellitus  3. Chronic diastolic CHF - better on LAsix 80 bid .  Continue .   Takes metalazone only as needed   4. Obesity.   Encouraged diet and weight loss.   I think that good bit of her DOE is due to her morbid obesity  She has had DOE for years.    We discussed the importance of weight loss.   5. Insomnia:    Asked her to review this issue with her primary MD   Current medicines are reviewed at length with the patient today.  The patient does not have concerns regarding medicines.  The following changes have been made:  no change  Labs/ tests ordered today include:  No orders of the defined types were placed in this encounter.   Disposition:   FU with me in 6 months      Signed, Mertie Moores, MD  01/23/2016 8:28 AM    [address], [place], [place]  [address] Phone: [phone]; Fax: [phone]

## 2016-01-23 NOTE — Patient Instructions (Signed)
Medication Instructions:  DECREASE Amiodarone to 200 mg on Mon, Wed, Fri only DECREASE Toprol (Metoprolol) to 50 mg twice daily   Labwork: None Ordered   Testing/Procedures: None Ordered   Follow-Up: Your physician wants you to follow-up in: 6 months with Dr. Acie Fredrickson.  You will receive a reminder letter in the mail two months in advance. If you don't receive a letter, please call our office to schedule the follow-up appointment.   If you need a refill on your cardiac medications before your next appointment, please call your pharmacy.   Thank you for choosing [facility]! Christen Bame, RN [phone]

## 2016-01-24 NOTE — Progress Notes (Signed)
Spoke with pt. About her results per Mannam message, she did express she will keep her upcoming appointment, but will try to call and see if he has some cancellations to be seen sooner. Nothing further needed.

## 2016-01-27 NOTE — Addendum Note (Signed)
Addended by: Sandrea Hammond D on: 01/27/2016 05:52 PM   Modules accepted: Orders

## 2016-01-29 DIAGNOSIS — Z79899 Other long term (current) drug therapy: Secondary | ICD-10-CM | POA: Diagnosis not present

## 2016-01-29 DIAGNOSIS — E119 Type 2 diabetes mellitus without complications: Secondary | ICD-10-CM | POA: Diagnosis not present

## 2016-01-29 DIAGNOSIS — Z09 Encounter for follow-up examination after completed treatment for conditions other than malignant neoplasm: Secondary | ICD-10-CM | POA: Diagnosis not present

## 2016-01-29 DIAGNOSIS — Z961 Presence of intraocular lens: Secondary | ICD-10-CM | POA: Diagnosis not present

## 2016-01-29 DIAGNOSIS — H1840 Unspecified corneal degeneration: Secondary | ICD-10-CM | POA: Diagnosis not present

## 2016-01-29 DIAGNOSIS — R600 Localized edema: Secondary | ICD-10-CM | POA: Diagnosis not present

## 2016-01-29 DIAGNOSIS — Z5189 Encounter for other specified aftercare: Secondary | ICD-10-CM | POA: Diagnosis not present

## 2016-01-29 DIAGNOSIS — H04123 Dry eye syndrome of bilateral lacrimal glands: Secondary | ICD-10-CM | POA: Diagnosis not present

## 2016-01-29 DIAGNOSIS — E038 Other specified hypothyroidism: Secondary | ICD-10-CM | POA: Diagnosis not present

## 2016-01-29 DIAGNOSIS — H35032 Hypertensive retinopathy, left eye: Secondary | ICD-10-CM | POA: Diagnosis not present

## 2016-02-06 ENCOUNTER — Encounter: Payer: Medicare HMO | Attending: Internal Medicine | Admitting: Nutrition

## 2016-02-06 VITALS — Ht 69.0 in | Wt 262.0 lb

## 2016-02-06 DIAGNOSIS — E1159 Type 2 diabetes mellitus with other circulatory complications: Secondary | ICD-10-CM | POA: Diagnosis not present

## 2016-02-06 DIAGNOSIS — I10 Essential (primary) hypertension: Secondary | ICD-10-CM | POA: Diagnosis not present

## 2016-02-06 DIAGNOSIS — Z713 Dietary counseling and surveillance: Secondary | ICD-10-CM | POA: Insufficient documentation

## 2016-02-06 DIAGNOSIS — E1165 Type 2 diabetes mellitus with hyperglycemia: Secondary | ICD-10-CM

## 2016-02-06 DIAGNOSIS — E118 Type 2 diabetes mellitus with unspecified complications: Secondary | ICD-10-CM

## 2016-02-06 DIAGNOSIS — E785 Hyperlipidemia, unspecified: Secondary | ICD-10-CM | POA: Insufficient documentation

## 2016-02-06 DIAGNOSIS — N289 Disorder of kidney and ureter, unspecified: Secondary | ICD-10-CM | POA: Diagnosis not present

## 2016-02-06 DIAGNOSIS — E669 Obesity, unspecified: Secondary | ICD-10-CM

## 2016-02-06 DIAGNOSIS — Z794 Long term (current) use of insulin: Secondary | ICD-10-CM

## 2016-02-06 DIAGNOSIS — IMO0002 Reserved for concepts with insufficient information to code with codable children: Secondary | ICD-10-CM

## 2016-02-06 NOTE — Progress Notes (Signed)
  Medical Nutrition Therapy:  Appt start time: 1000 end time:  1030.  Assessment:  Primary concerns today:    Goes to Dr Dagmar Hait next week for blood work.  Had gotten a lot of tests done for AFIB.  FBS  159 this am. 54 units Leverir, 12 units with meals sliding scale of Humalog. She lost 4 lbs. She eats out 1-2 meals per day. Diet is high in sodium from fast foods. Diet is low in fresh fruits and low carb vegetables. Doesn't cook much at home.  Limited exercise now due to AFIB and being tired so quickly. Drinking some juice. Not complying with diet restrictions consistently. Needs better diet compliance.   Preferred Learning Style:   No preference indicated   Learning Readiness:   Ready  MEDICATIONS: see list    DIETARY INTAKE:  24-hr recall:  B ( AM)  [place] and biscuit,  Coffee, water Snk ( AM): none L ( PM):  Roast beef with cheese slider, french fries, 20 oz juice ( PM):   D ( PM): Hot dog all the way and small fry, .    Snk ( PM):    Usual physical activity: walks or plays ball with grandchildren for 10 minutes 3 x day   Estimated energy needs: 1600 calories 180 g carbohydrates 120 g protein 44 g fat  Progress Towards Goal(s):  In progress.   Nutritional Diagnosis:  NI-5.10.2 Excessive mineral intake (specify): sodium As related to related fluid retention from congestive heart failure.  As evidenced by diet recall of frequent high sodium meals.  Nutritional Diagnosis:  Rocky-2.1 Inpaired nutrition utilization As related to glucose metabolism.  As evidenced by Hgb A1c of 8.2%.    Intervention:  Nutrition counseling provided. Stressed need for diet compliance.  Goals 1. Follow My Plate 2. Eat 2-3 carb choices per meal 3. Cut out high sodium foods and cut out snacks between meals. 4. Increase fresh fruits and whole grain vegetables 5. Drink only water and cut out juice. 6. Walk 15 minutes per day Get A1C to 7% Lose 1-2 lbs per week   Teaching Method Utilized:   Visual Auditory Hands on  Handouts given during visit include:  Low Sodium MNT  1500 calorie Meal Plan  Low sodium flavoring tips  Barriers to learning/adherence to lifestyle change: none  Demonstrated degree of understanding via:  Teach Back   Monitoring/Evaluation:  Dietary intake, exercise, and body weight in 2 month(s).

## 2016-02-18 ENCOUNTER — Ambulatory Visit (INDEPENDENT_AMBULATORY_CARE_PROVIDER_SITE_OTHER): Payer: Medicare HMO | Admitting: Pulmonary Disease

## 2016-02-18 ENCOUNTER — Encounter: Payer: Self-pay | Admitting: Pulmonary Disease

## 2016-02-18 VITALS — BP 126/66 | HR 55 | Ht 68.5 in | Wt 236.2 lb

## 2016-02-18 DIAGNOSIS — T462X4A Poisoning by other antidysrhythmic drugs, undetermined, initial encounter: Secondary | ICD-10-CM | POA: Diagnosis not present

## 2016-02-18 NOTE — Progress Notes (Signed)
[name]    [id number]    1945/06/06  Primary Care 3 R, MD  Referring Physician: Prince Solian, MD [address] [place], [place] [address]  Chief complaint:   Follow up evaluation of abnormal PFTs. ? Amiodarone toxicuty  HPI: Kristine [name] is a 70 year old with past medical history of atrial fibrillation, diastolic heart failure, diabetes mellitus. She had been previously evaluated by Dr. Annamaria Boots in 2012 for dyspnea on exertion. This was thought to be secondary to heart failure from atrial fibrillation, diastolic heart failure. She has a history of recurrent pneumonias with remote hemoptysis in the setting of anticoagulation.   She has hard to control atrial fibrillation. She had failed flecainide, tikosyn due to prolonged QT and has been on amiodarone since March of 2017. She's been in sinus rhythm since summer of 2017. She complains of persistent dyspnea on exertion. She does not have any dyspnea at rest or wheezing. She denies any cough, sputum production, fevers, chills. She reports an reaction to albuterol with palpitations and tachycardia. 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 She does not have any smoking history or relevant exposures.  Interim History: She had a had a CT with no definite evidence of amiodarone toxicity. She has followed up with Dr. Acie Fredrickson who has reduced her amiodarone dose. She continues to complain of dyspnea on exertion which is unchanged.  Outpatient Encounter Prescriptions as of 02/18/2016  Medication Sig  . ALPRAZolam (XANAX) 0.25 MG tablet Take 0.125-0.25 mg by mouth 2 (two) times daily as needed for anxiety.   Marland Kitchen amiodarone (PACERONE) 200 MG tablet Take 1 pill on Mondays, Wednesdays, and Fridays only  . BD PEN NEEDLE NANO U/F 32G X 4 MM MISC Inject 1 each as directed See admin instructions. Use pen needles with insulin pens daily  . esomeprazole  (NEXIUM) 40 MG capsule Take 40 mg by mouth daily as needed (for acid reflux).   . furosemide (LASIX) 80 MG tablet TAKE 1 TABLET (80 MG TOTAL) BY MOUTH 2 (TWO) TIMES DAILY.  Marland Kitchen gabapentin (NEURONTIN) 100 MG capsule Take 100 mg by mouth at bedtime.   Marland Kitchen HUMALOG KWIKPEN 200 UNIT/ML SOPN Inject 12 Units into the skin 3 (three) times daily with meals.   . insulin detemir (LEVEMIR) 100 UNIT/ML injection Inject 54 Units into the skin 2 (two) times daily.   Marland Kitchen KLOR-CON 10 10 MEQ tablet Take 20 mEq by mouth daily.  Marland Kitchen levothyroxine (SYNTHROID, LEVOTHROID) 25 MCG tablet Take 1 tablet by mouth daily.  Marland Kitchen losartan (COZAAR) 100 MG tablet TAKE 1 TABLET BY MOUTH EVERY DAY  . Melatonin 1 MG CAPS Take by mouth at bedtime.  . metFORMIN (GLUCOPHAGE) 500 MG tablet 1000 mg in the AM and 500mg  at night  . metolazone (ZAROXOLYN) 2.5 MG tablet Take 2.5 mg by mouth as needed (for shortness of breath).   . metoprolol succinate (TOPROL-XL) 50 MG 24 hr tablet Take 1 tablet (50 mg total) by mouth 2 (two) times daily.  . rivaroxaban (XARELTO) 20 MG TABS tablet Take 1 tablet (20 mg total) by mouth daily.  . Vitamin D, Ergocalciferol, (DRISDOL) 50000 UNITS CAPS Take 50,000 Units by mouth every 7 (seven) days. Takes on Thursday  . XOPENEX HFA 45 MCG/ACT inhaler Take 1 puff by mouth 4 (four) times daily as needed for wheezing.    No facility-administered encounter medications on file as of 02/18/2016.  Allergies as of 02/18/2016 - Review Complete 02/18/2016  Allergen Reaction Noted  . Albuterol Other (See Comments) 05/12/2010  . Epinephrine Other (See Comments) 11/16/2013  . Penicillins Other (See Comments) 07/25/2008  . Bee venom Other (See Comments) 05/27/2010  . Ivp dye [iodinated diagnostic agents] Other (See Comments) 05/05/2012    Past Medical History:  Diagnosis Date  . Anxiety   . Asthma   . CHF (congestive heart failure) ([place])   . Chronic bronchitis ([place])   . GERD (gastroesophageal reflux disease)   . High  cholesterol   . History of hiatal hernia   . HTN (hypertension)   . Mild aortic sclerosis ([place])   . Morbid obesity ([place])   . Osteoporosis   . Persistent atrial fibrillation ([place])   . Scoliosis   . Spinal stenosis   . Type II diabetes mellitus ([place])   . Vitamin D deficiency     Past Surgical History:  Procedure Laterality Date  . BLADDER SUSPENSION  1980s  . CARDIAC CATHETERIZATION  11/16/09   SMOOTH AND NORMAL  . CARDIOVERSION N/A 01/07/2015   Procedure: CARDIOVERSION;  Surgeon: Skeet Latch, MD;  Location[facility] ENDOSCOPY;  Service: Cardiovascular;  Laterality: N/A;  . CARDIOVERSION N/A 03/11/2015   Procedure: CARDIOVERSION;  Surgeon: Skeet Latch, MD;  Location: [place];  Service: Cardiovascular;  Laterality: N/A;  . CARPAL TUNNEL RELEASE Left 1970s  . CATARACT EXTRACTION W/ INTRAOCULAR LENS  IMPLANT, BILATERAL Bilateral ~ 2010  . COLONOSCOPY  08/14/08  . FINGER FRACTURE SURGERY Right 1970s   "ring finger"  . FRACTURE SURGERY    . LAPAROSCOPIC CHOLECYSTECTOMY  1980s  . TUBAL LIGATION    . VAGINAL HYSTERECTOMY  1980    Family History  Problem Relation Age of Onset  . Stroke Father   . Hypertension Father   . Atrial fibrillation Father     HAD MURMUR  . Heart failure Mother   . Congestive Heart Failure Mother   . Hypertension Mother   . Hypertension Brother   . Hypertension Sister   . Hypertension Son   . CAD Sister     EARLY  . CAD Brother     EARLY    Social History   Social History  . Marital status: Married    Spouse name: N/A  . Number of children: N/A  . Years of education: N/A   Occupational History  . Not on file.   Social History Main Topics  . Smoking status: Never Smoker  . Smokeless tobacco: Never Used  . Alcohol use No  . Drug use: No  . Sexual activity: Yes   Other Topics Concern  . Not on file   Social History Narrative   Pt lives in [place] with spouse.   Retired Engineer, production.  RN.   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     Review of systems: Review of Systems  Constitutional: Negative for fever and chills.  HENT: Negative.   Eyes: Negative for blurred vision.  Respiratory: as per HPI  Cardiovascular: Negative for chest pain and palpitations.  Gastrointestinal: Negative for vomiting, diarrhea, blood per rectum. Genitourinary: Negative for dysuria, urgency, frequency and hematuria.  Musculoskeletal: Negative for myalgias, back pain and joint pain.  Skin: Negative for itching and rash.  Neurological: Negative for dizziness, tremors, focal weakness, seizures and loss of consciousness.  Endo/Heme/Allergies: Negative for environmental allergies.  Psychiatric/Behavioral: Negative for depression, suicidal ideas and  hallucinations.  All other systems reviewed and are negative.   Physical Exam: Blood pressure 124/68, pulse (!) 57, height 5\' 9"  (1.753 m), weight 263 lb (119.3 kg), SpO2 90 %. Gen:      No acute distress HEENT:  EOMI, sclera anicteric Neck:     No masses; no thyromegaly Lungs:    Clear to auscultation bilaterally; normal respiratory effort CV:         Regular rate and rhythm; no murmurs Abd:      + bowel sounds; soft, non-tender; no palpable masses, no distension Ext:    No edema; adequate peripheral perfusion Skin:      Warm and dry; no rash Neuro: alert and oriented x 3 Psych: normal mood and affect  Data Reviewed: Echo 04/04/15 Mild to moderate LVH with LVEF 55-60% and grade 2 diastolic   dysfunction with increased filling pressures. Mild left atrial   enlargement. Mild MAC with mild mitral regurgitation. Sclerotic   aortic valve without stenosis. Mildly dilated RV with normal   contraction. Trivial tricuspid regurgitation, unable to   accurately assess PASP. Trivial pericardial effusion.  CXR 05/30/15- RUL infiltrate. Images reviewed  PFT-12/05/09- Mild restriction TLC 73%, DLCO 69%, Spirometry WNL w/ resp to  BD 6MWT-12/05/09- 100%, 98%, 98% 368 m.  PFTs 11/19/15 FVC 3.61 (69%) FEV1 2.75 (69%) F/F 76 TLC 82% DLCO 49% Moderate-severe diffusion defect.  High res CT 01/23/16  Patchy mild regurgitation, faint groundglass opacities. Nonspecific findings not clearly consistent with amiodarone toxicity. Images reviewed  Assessment:  Evaluation for dyspnea on exertion, abnormal PFTs.  CT and PFTs were reviewed with her today. They did not show any obstruction to suggest either COPD or emphysema. There is an isolated reduction in diffusion capacity that corrects for alveolar volume. Compared to 2011 there is worsening of her diffusion capacity. She has been on amiodarone for the past year and CT does not show any definite evidence of amiodarone toxicity. Her amiodarone dose has been reduced this month. We will follow-up with a repeat CT next year to monitor for any changes.  I suspect her dyspnea is multifactorial secondary to her obesity, deconditioning and chronic heart failure, A. fib issues. I don't believe she'll need to be on any inhalers right now. I have encouraged her to continue with a diet program. She will benefit from referral to a cardiac pulmonary rehabilitation program as well.  Plan/Recommendations: - Follow up CT next year - Continue with diet and exercise - Referral to rehab program                                                Return in 6 month.  Marshell Garfinkel MD [facility] Pager [phone] 02/18/2016, 9:35 AM  CC: Prince Solian, MD

## 2016-02-18 NOTE — Patient Instructions (Signed)
The CT scan was reviewed with you today. There is no definite evidence of amiodarone toxicity. We will continue to monitor this with a repeat CT next year. Please continue to work on diet, weight loss. We will refer you to a rehabilitation program at [facility].  Return to clinic in 6 months.

## 2016-02-20 ENCOUNTER — Encounter (HOSPITAL_COMMUNITY): Payer: Self-pay | Admitting: Nurse Practitioner

## 2016-02-20 ENCOUNTER — Ambulatory Visit (HOSPITAL_COMMUNITY)
Admission: RE | Admit: 2016-02-20 | Discharge: 2016-02-20 | Disposition: A | Discharge status: Home / Self Care | Payer: Medicare HMO | Source: Ambulatory Visit | Attending: Nurse Practitioner | Admitting: Nurse Practitioner

## 2016-02-20 VITALS — BP 126/64 | HR 56 | Ht 69.0 in | Wt 256.8 lb

## 2016-02-20 DIAGNOSIS — Z880 Allergy status to penicillin: Secondary | ICD-10-CM | POA: Insufficient documentation

## 2016-02-20 DIAGNOSIS — Z9049 Acquired absence of other specified parts of digestive tract: Secondary | ICD-10-CM | POA: Diagnosis not present

## 2016-02-20 DIAGNOSIS — Z91030 Bee allergy status: Secondary | ICD-10-CM | POA: Diagnosis not present

## 2016-02-20 DIAGNOSIS — E038 Other specified hypothyroidism: Secondary | ICD-10-CM | POA: Diagnosis not present

## 2016-02-20 DIAGNOSIS — I481 Persistent atrial fibrillation: Secondary | ICD-10-CM

## 2016-02-20 DIAGNOSIS — Z794 Long term (current) use of insulin: Secondary | ICD-10-CM | POA: Insufficient documentation

## 2016-02-20 DIAGNOSIS — I5031 Acute diastolic (congestive) heart failure: Secondary | ICD-10-CM | POA: Insufficient documentation

## 2016-02-20 DIAGNOSIS — E7800 Pure hypercholesterolemia, unspecified: Secondary | ICD-10-CM | POA: Diagnosis not present

## 2016-02-20 DIAGNOSIS — M419 Scoliosis, unspecified: Secondary | ICD-10-CM | POA: Diagnosis not present

## 2016-02-20 DIAGNOSIS — E559 Vitamin D deficiency, unspecified: Secondary | ICD-10-CM | POA: Diagnosis not present

## 2016-02-20 DIAGNOSIS — Z91041 Radiographic dye allergy status: Secondary | ICD-10-CM | POA: Diagnosis not present

## 2016-02-20 DIAGNOSIS — Z9841 Cataract extraction status, right eye: Secondary | ICD-10-CM | POA: Diagnosis not present

## 2016-02-20 DIAGNOSIS — Z6837 Body mass index (BMI) 37.0-37.9, adult: Secondary | ICD-10-CM | POA: Diagnosis not present

## 2016-02-20 DIAGNOSIS — Z90710 Acquired absence of both cervix and uterus: Secondary | ICD-10-CM | POA: Diagnosis not present

## 2016-02-20 DIAGNOSIS — E119 Type 2 diabetes mellitus without complications: Secondary | ICD-10-CM | POA: Insufficient documentation

## 2016-02-20 DIAGNOSIS — I110 Hypertensive heart disease with heart failure: Secondary | ICD-10-CM | POA: Diagnosis not present

## 2016-02-20 DIAGNOSIS — J45909 Unspecified asthma, uncomplicated: Secondary | ICD-10-CM | POA: Insufficient documentation

## 2016-02-20 DIAGNOSIS — E784 Other hyperlipidemia: Secondary | ICD-10-CM | POA: Diagnosis not present

## 2016-02-20 DIAGNOSIS — K219 Gastro-esophageal reflux disease without esophagitis: Secondary | ICD-10-CM | POA: Insufficient documentation

## 2016-02-20 DIAGNOSIS — I4819 Other persistent atrial fibrillation: Secondary | ICD-10-CM

## 2016-02-20 DIAGNOSIS — Z823 Family history of stroke: Secondary | ICD-10-CM | POA: Insufficient documentation

## 2016-02-20 DIAGNOSIS — Z888 Allergy status to other drugs, medicaments and biological substances status: Secondary | ICD-10-CM | POA: Diagnosis not present

## 2016-02-20 DIAGNOSIS — Z0000 Encounter for general adult medical examination without abnormal findings: Secondary | ICD-10-CM | POA: Diagnosis not present

## 2016-02-20 DIAGNOSIS — I4891 Unspecified atrial fibrillation: Secondary | ICD-10-CM | POA: Diagnosis not present

## 2016-02-20 DIAGNOSIS — Z7901 Long term (current) use of anticoagulants: Secondary | ICD-10-CM | POA: Insufficient documentation

## 2016-02-20 DIAGNOSIS — F419 Anxiety disorder, unspecified: Secondary | ICD-10-CM | POA: Diagnosis not present

## 2016-02-20 DIAGNOSIS — Z9842 Cataract extraction status, left eye: Secondary | ICD-10-CM | POA: Insufficient documentation

## 2016-02-20 DIAGNOSIS — E039 Hypothyroidism, unspecified: Secondary | ICD-10-CM | POA: Diagnosis not present

## 2016-02-20 DIAGNOSIS — Z8249 Family history of ischemic heart disease and other diseases of the circulatory system: Secondary | ICD-10-CM | POA: Insufficient documentation

## 2016-02-20 DIAGNOSIS — Z79899 Other long term (current) drug therapy: Secondary | ICD-10-CM | POA: Diagnosis not present

## 2016-02-20 DIAGNOSIS — I10 Essential (primary) hypertension: Secondary | ICD-10-CM | POA: Diagnosis not present

## 2016-02-20 DIAGNOSIS — E1159 Type 2 diabetes mellitus with other circulatory complications: Secondary | ICD-10-CM | POA: Diagnosis not present

## 2016-02-20 LAB — COMPREHENSIVE METABOLIC PANEL
ALK PHOS: 85 U/L (ref 38–126)
ALT: 30 U/L (ref 14–54)
ANION GAP: 14 (ref 5–15)
AST: 32 U/L (ref 15–41)
Albumin: 3.8 g/dL (ref 3.5–5.0)
BUN: 31 mg/dL — ABNORMAL HIGH (ref 6–20)
CALCIUM: 9.3 mg/dL (ref 8.9–10.3)
CO2: 28 mmol/L (ref 22–32)
CREATININE: 2.02 mg/dL — AB (ref 0.44–1.00)
Chloride: 97 mmol/L — ABNORMAL LOW (ref 101–111)
GFR, EST AFRICAN AMERICAN: 28 mL/min — AB (ref >60)
GFR, EST NON AFRICAN AMERICAN: 24 mL/min — AB (ref >60)
Glucose, Bld: 122 mg/dL — ABNORMAL HIGH (ref 65–99)
Potassium: 3.3 mmol/L — ABNORMAL LOW (ref 3.5–5.1)
SODIUM: 139 mmol/L (ref 135–145)
Total Bilirubin: 0.8 mg/dL (ref 0.3–1.2)
Total Protein: 6.8 g/dL (ref 6.5–8.1)

## 2016-02-20 LAB — TSH: TSH: 6.539 u[IU]/mL — AB (ref 0.350–4.500)

## 2016-02-20 NOTE — Progress Notes (Signed)
Patient ID: Kristine [name], female   DOB: 01-Jun-1945, 70 y.o.   MRN: [id number]     Primary Care Physician: Tivis Ringer, MD Referring Physician: Dr. Rayann Heman Cardiologist: Dr. Jettie Booze Kristine [name] is a 70 y.o. female with a h/o persistent afib, failing flecainide,tikosyn due to prolonged qt and currently on amiodarone. She was hospitalized with pneumonia in early March.She had chest tightness, arm and leg weakness with increased shortness of breath with exertion, relieved with rest after pneumonia.  Myoview obtained and low risk.  She said that these symptoms actually started prior to getting sick with URI.   She returns 5/11 and is feeling better. She thinks the remnants of having pneumonia were contributing to symptoms when last seen. Staying in [place].   F/u afib clinic, feeling better. Going on some trips this summer which she is happy about. Has not noticed any afib. Main c/o is that she was not sleeping well. She was taken off some prior sleep aids due to long qt, PCP put her on melatonin, but she still wakes up at 2-3 in the am and has to get out of bed for around an hour. She has also been placed on synthroid a few weeks ago. Continues with xarelto for chadsvasc score of at least 5. No bleeding issues.  F/u  afib clinic 11/30. She feels well. Has lost 10 lbs, followed by nutritionist. She has seen ophthalmologist and pulmonologist and is OK to continue on amiodarone. Currently on 100 mg M,W,F. She has continued shortness of breath on exertion and pulmonologist thought this was multifactorial due to obesity, deconditioning and  diastolic dysfunction. No issues with bleeding on xarelto.  Today, she denies symptoms of orthopnea, PND, lower extremity edema, dizziness, presyncope, syncope, or neurologic sequela. Positive for shortness of breath,improved but ongoing.The patient is tolerating medications without difficulties and is otherwise without complaint today.   Past Medical History:   Diagnosis Date  . Anxiety   . Asthma   . CHF (congestive heart failure) ([place])   . Chronic bronchitis ([place])   . GERD (gastroesophageal reflux disease)   . High cholesterol   . History of hiatal hernia   . HTN (hypertension)   . Mild aortic sclerosis ([place])   . Morbid obesity ([place])   . Osteoporosis   . Persistent atrial fibrillation ([place])   . Scoliosis   . Spinal stenosis   . Type II diabetes mellitus ([place])   . Vitamin D deficiency    Past Surgical History:  Procedure Laterality Date  . BLADDER SUSPENSION  1980s  . CARDIAC CATHETERIZATION  11/16/09   SMOOTH AND NORMAL  . CARDIOVERSION N/A 01/07/2015   Procedure: CARDIOVERSION;  Surgeon: Skeet Latch, MD;  Location: [facility] ENDOSCOPY;  Service: Cardiovascular;  Laterality: N/A;  . CARDIOVERSION N/A 03/11/2015   Procedure: CARDIOVERSION;  Surgeon: Skeet Latch, MD;  Location: [facility];  Service: Cardiovascular;  Laterality: N/A;  . CARPAL TUNNEL RELEASE Left 1970s  . CATARACT EXTRACTION W/ INTRAOCULAR LENS  IMPLANT, BILATERAL Bilateral ~ 2010  . COLONOSCOPY  08/14/08  . FINGER FRACTURE SURGERY Right 1970s   "ring finger"  . FRACTURE SURGERY    . LAPAROSCOPIC CHOLECYSTECTOMY  1980s  . TUBAL LIGATION    . VAGINAL HYSTERECTOMY  1980    Current Outpatient Prescriptions  Medication Sig Dispense Refill  . ALPRAZolam (XANAX) 0.25 MG tablet Take 0.125-0.25 mg by mouth 2 (two) times daily as needed for anxiety.   0  . amiodarone (PACERONE) 200 MG tablet  Take 1 pill on Mondays, Wednesdays, and Fridays only 180 tablet 3  . BD PEN NEEDLE NANO U/F 32G X 4 MM MISC Inject 1 each as directed See admin instructions. Use pen needles with insulin pens daily  11  . esomeprazole (NEXIUM) 40 MG capsule Take 40 mg by mouth daily as needed (for acid reflux).     . furosemide (LASIX) 80 MG tablet TAKE 1 TABLET (80 MG TOTAL) BY MOUTH 2 (TWO) TIMES DAILY. 180 tablet 3  . gabapentin (NEURONTIN) 100 MG capsule Take 100 mg by mouth at bedtime.       Marland Kitchen HUMALOG KWIKPEN 200 UNIT/ML SOPN Inject 12 Units into the skin 3 (three) times daily with meals.   6  . insulin detemir (LEVEMIR) 100 UNIT/ML injection Inject 54 Units into the skin 2 (two) times daily.     Marland Kitchen KLOR-CON 10 10 MEQ tablet Take 20 mEq by mouth daily.  11  . levothyroxine (SYNTHROID, LEVOTHROID) 25 MCG tablet Take 1 tablet by mouth daily.    Marland Kitchen losartan (COZAAR) 100 MG tablet TAKE 1 TABLET BY MOUTH EVERY DAY 90 tablet 3  . metFORMIN (GLUCOPHAGE) 500 MG tablet 1000 mg in the AM and 500mg  at night    . metolazone (ZAROXOLYN) 2.5 MG tablet Take 2.5 mg by mouth as needed (for shortness of breath).     . metoprolol succinate (TOPROL-XL) 50 MG 24 hr tablet Take 1 tablet (50 mg total) by mouth 2 (two) times daily. 270 tablet 3  . rivaroxaban (XARELTO) 20 MG TABS tablet Take 1 tablet (20 mg total) by mouth daily. 30 tablet 4  . Vitamin D, Ergocalciferol, (DRISDOL) 50000 UNITS CAPS Take 50,000 Units by mouth every 7 (seven) days. Takes on Thursday    . Melatonin 1 MG CAPS Take by mouth at bedtime.     No current facility-administered medications for this encounter.     Allergies  Allergen Reactions  . Albuterol Other (See Comments)    REACTION: Tachycardia- AFib  . Epinephrine Other (See Comments)    Increases heart rate per patient.   . Penicillins Other (See Comments)    REACTION: flushing \\T \ hot  . Bee Venom Other (See Comments)    "makes me nervous"  . Ivp Dye [Iodinated Diagnostic Agents] Other (See Comments)    flushing    Social History   Social History  . Marital status: Married    Spouse name: N/A  . Number of children: N/A  . Years of education: N/A   Occupational History  . Not on file.   Social History Main Topics  . Smoking status: Never Smoker  . Smokeless tobacco: Never Used  . Alcohol use No  . Drug use: No  . Sexual activity: Yes   Other Topics Concern  . Not on file   Social History Narrative   Pt lives in [place] with spouse.   Retired  Engineer, production.  RN.   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    Family History  Problem Relation Age of Onset  . Stroke Father   . Hypertension Father   . Atrial fibrillation Father     HAD MURMUR  . Heart failure Mother   . Congestive Heart Failure Mother   . Hypertension Mother   . Hypertension Brother   . Hypertension Sister   . Hypertension Son   . CAD Sister     EARLY  .  CAD Brother     EARLY    ROS- All systems are reviewed and negative except as per the HPI above  Physical Exam: Vitals:   02/20/16 0840  BP: 126/64  Pulse: (!) 56  Weight: 256 lb 12.8 oz (116.5 kg)  Height: 5\' 9"  (1.753 m)    GEN- The patient is well appearing, alert and oriented x 3 today.   Head- normocephalic, atraumatic Eyes-  Sclera clear, conjunctiva pink Ears- hearing intact Oropharynx- clear Neck- supple, no JVP Lymph- no cervical lymphadenopathy Lungs- Clear to ausculation bilaterally, normal work of breathing Heart- Regular rate and rhythm, no murmurs, rubs or gallops, PMI not laterally displaced GI- soft, NT, ND, + BS Extremities- no clubbing, cyanosis, or edema MS- no significant deformity or atrophy Skin- no rash or lesion Psych- euthymic mood, full affect Neuro- strength and sensation are intact  EKG- Sinus brady with HR of  56, pr int 182 ms, qrs 98 ms, qtc 497 ms Epic records reviewed    Assessment and Plan: 1. Afib  In SR on amiodarone 100 mg M,W,F Continue metoprolol Continue xarelto TSH/liver today  2.Chronic shortness of breath  Recently evaluated by Pulmonary, no toxcitiy found, and   Ok to continue amiodarone Feels dyspnea is mutltifactorial  3. Diastolic heart failure  Lasix 80 mg bid Limit fluids to no more than 6 glasses a day as per nutritionist Continue daily weights  4. HTN  Controlled  5. Hypothyroidism On synthroid  Per pcp  5. Obesity Congratulated on 10 lb weight  loss Continue f/u with nutritionist   F/u Dr. Cathie Olden for long term amiodarone use afib clinic as needed  Butch Penny C. Mianna Iezzi, [facility] [address] [place],  [phone]

## 2016-02-24 NOTE — Patient Instructions (Addendum)
Goals 1. Follow My Plate 2. Eat 2-3 carb choices per meal 3. Cut out high sodium foods and cut out snacks between meals. 4. Increase fresh fruits and whole grain vegetables 5. Drink only water and cut out juice. 6. Walk 15 minutes per day Get A1C to 7% Lose 1-2 lbs per week

## 2016-02-28 ENCOUNTER — Other Ambulatory Visit: Payer: Self-pay | Admitting: Internal Medicine

## 2016-02-28 DIAGNOSIS — Z1231 Encounter for screening mammogram for malignant neoplasm of breast: Secondary | ICD-10-CM

## 2016-03-02 DIAGNOSIS — K219 Gastro-esophageal reflux disease without esophagitis: Secondary | ICD-10-CM | POA: Diagnosis not present

## 2016-03-02 DIAGNOSIS — Z0000 Encounter for general adult medical examination without abnormal findings: Secondary | ICD-10-CM | POA: Diagnosis not present

## 2016-03-02 DIAGNOSIS — I480 Paroxysmal atrial fibrillation: Secondary | ICD-10-CM | POA: Diagnosis not present

## 2016-03-02 DIAGNOSIS — N183 Chronic kidney disease, stage 3 (moderate): Secondary | ICD-10-CM | POA: Diagnosis not present

## 2016-03-02 DIAGNOSIS — I5189 Other ill-defined heart diseases: Secondary | ICD-10-CM | POA: Diagnosis not present

## 2016-03-02 DIAGNOSIS — R69 Illness, unspecified: Secondary | ICD-10-CM | POA: Diagnosis not present

## 2016-03-02 DIAGNOSIS — E784 Other hyperlipidemia: Secondary | ICD-10-CM | POA: Diagnosis not present

## 2016-03-02 DIAGNOSIS — I129 Hypertensive chronic kidney disease with stage 1 through stage 4 chronic kidney disease, or unspecified chronic kidney disease: Secondary | ICD-10-CM | POA: Diagnosis not present

## 2016-03-02 DIAGNOSIS — E1140 Type 2 diabetes mellitus with diabetic neuropathy, unspecified: Secondary | ICD-10-CM | POA: Diagnosis not present

## 2016-03-02 DIAGNOSIS — E038 Other specified hypothyroidism: Secondary | ICD-10-CM | POA: Diagnosis not present

## 2016-03-06 NOTE — Progress Notes (Signed)
[place] Report   Patient Details  Name: Kristine [name] MRN: [id number] Date of Birth: 09/07/1945 Age: 70 y.o. PCP: Tivis Ringer, MD  Vitals:   03/06/16 1338  BP: 126/88  Pulse: (!) 51  Resp: 18  SpO2: 97%  Weight: 270 lb 9.6 oz (122.7 kg)  Height: 5' 9.25" (1.759 m)         [facility] Eval - 03/06/16 1300      Referral    Referring Provider Dr. Rayann Heman   Reason for referral Inactivity;Diabetes;Obesitity/Overweight   Program Start Date 03/06/16     Measurement   Hip Circumference 56 inches   Body fat 47.3 percent     Information for Trainer   Goals --  support in exercise to improve breathing, lose weight.   Current Exercise none   Orthopedic Concerns none   Pertinent Medical History afib, obesity,diabetes   Current Barriers "belly", balance, shob   Restrictions/Precautions --  may need snack if cbg drops   Medications that affect exercise Beta blocker;Medication causing dizziness/drowsiness     Mobility and Daily Activities   I find it easy to walk up or down two or more flights of stairs. 1   I have no trouble taking out the trash. 2   I do housework such as vacuuming and dusting on my own without difficulty. 2   I can easily lift a gallon of milk (8lbs). 3   I can easily walk a mile. 2   I have no trouble reaching into high cupboards or reaching down to pick up something from the floor. 2   I do not have trouble doing out-door work such as Armed forces logistics/support/administrative officer, raking leaves, or gardening. 2     Mobility and Daily Activities   I feel younger than my age. 3   I feel independent. 2   I feel energetic. 2   I live an active life.  2   I feel strong. 2   I feel healthy. 2   I feel active as other people my age. 1     How fit and strong are you.   Fit and Strong Total Score 28     Past Medical History:  Diagnosis Date  . Anxiety   . Asthma   . CHF (congestive heart failure) ([place])   . Chronic bronchitis ([place])   . GERD  (gastroesophageal reflux disease)   . High cholesterol   . History of hiatal hernia   . HTN (hypertension)   . Mild aortic sclerosis ([place])   . Morbid obesity ([place])   . Osteoporosis   . Persistent atrial fibrillation ([place])   . Scoliosis   . Spinal stenosis   . Type II diabetes mellitus ([place])   . Vitamin D deficiency    Past Surgical History:  Procedure Laterality Date  . BLADDER SUSPENSION  1980s  . CARDIAC CATHETERIZATION  11/16/09   SMOOTH AND NORMAL  . CARDIOVERSION N/A 01/07/2015   Procedure: CARDIOVERSION;  Surgeon: Skeet Latch, MD;  Location: [facility] ENDOSCOPY;  Service: Cardiovascular;  Laterality: N/A;  . CARDIOVERSION N/A 03/11/2015   Procedure: CARDIOVERSION;  Surgeon: Skeet Latch, MD;  Location: [facility];  Service: Cardiovascular;  Laterality: N/A;  . CARPAL TUNNEL RELEASE Left 1970s  . CATARACT EXTRACTION W/ INTRAOCULAR LENS  IMPLANT, BILATERAL Bilateral ~ 2010  . COLONOSCOPY  08/14/08  . FINGER FRACTURE SURGERY Right 1970s   "ring finger"  . FRACTURE SURGERY    . LAPAROSCOPIC CHOLECYSTECTOMY  1980s  . TUBAL LIGATION    . VAGINAL HYSTERECTOMY  1980   History  Smoking Status  . Never Smoker  Smokeless Tobacco  . Never Used   Comments: Rozena has come in today to register for the PREP after calling for information this am.  She sounds dedicated to making changes to her lifestyle and seems ready for exercise.  She seems nervous about getting short of breath as she did walking down the hallway of [name] on her tour.  At one point she started to cry when she had to take a break while walking to catch her breath and said she was embarrassed about not being able to walk very far.  She expressed wanting to be able to go to [facility] and not have to use the motorized cart as well as clean her house again.  She also wants to improve her balance. Ashrita was given reassurance and will be provided support from here on out.   Shanty's preferred day for training is  Wednesday and preferred time is Morning (9AM -12PM)   [name] 03/06/2016, 1:43 PM

## 2016-03-09 DIAGNOSIS — Z1212 Encounter for screening for malignant neoplasm of rectum: Secondary | ICD-10-CM | POA: Diagnosis not present

## 2016-03-11 NOTE — Progress Notes (Signed)
[facility] PREP Weekly Session   Patient Details  Name: Kristine [name] MRN: [id number] Date of Birth: 02-Sep-1945 Age: 70 y.o. PCP: Tivis Ringer, MD  There were no vitals filed for this visit.      [name] Weekly seesion - 03/11/16 1300      Weekly Session   Topic Discussed Importance of resistance training   Minutes exercised this week 60 minutes  cardio   Classes attended to date 1   Comments nutrition celebration=more veg" struggling with chocoloate       [name] 03/11/2016, 1:52 PM

## 2016-03-20 ENCOUNTER — Encounter (HOSPITAL_COMMUNITY): Payer: Self-pay

## 2016-03-20 ENCOUNTER — Encounter (HOSPITAL_COMMUNITY)
Admission: RE | Admit: 2016-03-20 | Discharge: 2016-03-20 | Disposition: A | Discharge status: Home / Self Care | Payer: Medicare HMO | Source: Ambulatory Visit | Attending: Pulmonary Disease | Admitting: Pulmonary Disease

## 2016-03-20 VITALS — BP 163/68 | HR 64 | Resp 18 | Ht 67.5 in | Wt 265.7 lb

## 2016-03-20 DIAGNOSIS — R0602 Shortness of breath: Secondary | ICD-10-CM | POA: Diagnosis not present

## 2016-03-20 DIAGNOSIS — J984 Other disorders of lung: Secondary | ICD-10-CM

## 2016-03-20 DIAGNOSIS — T462X5A Adverse effect of other antidysrhythmic drugs, initial encounter: Secondary | ICD-10-CM

## 2016-03-20 NOTE — Progress Notes (Signed)
Kristine [name] 70 y.o. female Pulmonary Rehab Orientation Note Patient arrived today in Cardiac and Pulmonary Rehab for orientation to Pulmonary Rehab. She was transported from [employer] via wheel chair. She has not been prescribed oxygen for home or portable use. Color good, skin warm and dry. Patient is oriented to time and place. Patient's medical history, psychosocial health, and medications reviewed. Psychosocial assessment reveals pt lives with their spouse. Pt is currently working parttime as a Therapist, music. Pt hobbies include spending time with her grandchildren, traveling to see them, dancing, and reading. She would like to try yoga. Pt reports her stress level is moderate. Areas of stress/anxiety include her diagnosis of diabetes and the relocation of her granddaughter to [place]. She admits that she was having a hard time adjusting to life without her everyday.  Pt does exhibit some signs of depression. Signs of depression include tearfulness when talking about her health. and difficulty maintaining sleep. She contributes her PM diuretic to her inability to sleep through the night. Talked with patient about taking pm lasix does at 6pm instead of 9/9:30pm just prior to bed. PHQ2/9 score 0/na. Pt shows fair  coping skills with positive outlook . She was offered emotional support and reassurance. Will continue to monitor and evaluate progress toward psychosocial goal(s) of moving to the acceptance of her diagnosis of DM and decreasing her anger and resentment toward the disease. Physical assessment reveals heart rate is normal, breath sounds clear to auscultation, no wheezes, rales, or rhonchi. Grip strength equal, strong. Distal pulses palpable. Mild pitting edema noted to ankles and lower legs. Information on low NA diet given and reviewed. Patient reports she does take medications as prescribed. Patient states she follows a Low Sodium, Diabetic diet. The patient reports no specific efforts  to gain or lose weight.. Patient's weight will be monitored closely. Demonstration and practice of PLB using pulse oximeter. Patient able to return demonstration satisfactorily. Safety and hand hygiene in the exercise area reviewed with patient. Patient voices understanding of the information reviewed. Department expectations discussed with patient and achievable goals were set. The patient shows enthusiasm about attending the program and we look forward to working with this nice lady. The patient is scheduled for a 6 min walk test on 03/24/16 at 9:30 and to begin exercise on 03/31/16 at 10:30.   45 minutes was spent on a variety of activities such as assessment of the patient, obtaining baseline data including height, weight, BMI, and grip strength, verifying medical history, allergies, and current medications, and teaching patient strategies for performing tasks with less respiratory effort with emphasis on pursed lip breathing.

## 2016-03-24 ENCOUNTER — Encounter (HOSPITAL_COMMUNITY)
Admission: RE | Admit: 2016-03-24 | Discharge: 2016-03-24 | Disposition: A | Discharge status: Home / Self Care | Payer: Medicare HMO | Source: Ambulatory Visit | Attending: Pulmonary Disease | Admitting: Pulmonary Disease

## 2016-03-24 DIAGNOSIS — J984 Other disorders of lung: Secondary | ICD-10-CM

## 2016-03-24 DIAGNOSIS — R0602 Shortness of breath: Secondary | ICD-10-CM | POA: Diagnosis present

## 2016-03-24 DIAGNOSIS — T462X5A Adverse effect of other antidysrhythmic drugs, initial encounter: Secondary | ICD-10-CM

## 2016-03-24 NOTE — Progress Notes (Signed)
Pulmonary Individual Treatment Plan  Patient Details  Name: Kristine [name] MRN: [id number] Date of Birth: 01-Jan-1946 Referring Provider:   April Manson Pulmonary Rehab Walk Test from 03/24/2016 in [place]  Referring Provider  Dr. Vaughan Browner      Initial Encounter Date:  Flowsheet Row Pulmonary Rehab Walk Test from 03/24/2016 in [place]  Date  03/24/16  Referring Provider  Dr. Vaughan Browner      Visit Diagnosis: Amiodarone pulmonary toxicity  Patient's Home Medications on Admission:   Current Outpatient Prescriptions:  .  ALPRAZolam (XANAX) 0.25 MG tablet, Take 0.125-0.25 mg by mouth 2 (two) times daily as needed for anxiety. , Disp: , Rfl: 0 .  amiodarone (PACERONE) 200 MG tablet, Take 1 pill on Mondays, Wednesdays, and Fridays only, Disp: 180 tablet, Rfl: 3 .  BD PEN NEEDLE NANO U/F 32G X 4 MM MISC, Inject 1 each as directed See admin instructions. Use pen needles with insulin pens daily, Disp: , Rfl: 11 .  clindamycin (CLEOCIN) 300 MG capsule, TAKE 2 CAPSULES BY MOUTH 1 HOUR PRIOR TO DENTAL APPOINTMENT, Disp: , Rfl: 0 .  esomeprazole (NEXIUM) 40 MG capsule, Take 40 mg by mouth daily as needed (for acid reflux). , Disp: , Rfl:  .  furosemide (LASIX) 80 MG tablet, TAKE 1 TABLET (80 MG TOTAL) BY MOUTH 2 (TWO) TIMES DAILY., Disp: 180 tablet, Rfl: 3 .  gabapentin (NEURONTIN) 100 MG capsule, Take 100 mg by mouth at bedtime. , Disp: , Rfl:  .  HUMALOG KWIKPEN 200 UNIT/ML SOPN, Inject 12 Units into the skin 3 (three) times daily with meals. , Disp: , Rfl: 6 .  insulin detemir (LEVEMIR) 100 UNIT/ML injection, Inject 54 Units into the skin 2 (two) times daily. , Disp: , Rfl:  .  KLOR-CON 10 10 MEQ tablet, Take 20 mEq by mouth daily., Disp: , Rfl: 11 .  levothyroxine (SYNTHROID, LEVOTHROID) 25 MCG tablet, Take 1 tablet by mouth daily., Disp: , Rfl:  .  losartan (COZAAR) 100 MG tablet, TAKE 1 TABLET BY MOUTH EVERY DAY, Disp: 90 tablet,  Rfl: 3 .  Melatonin 1 MG CAPS, Take by mouth at bedtime., Disp: , Rfl:  .  metFORMIN (GLUCOPHAGE) 500 MG tablet, 1000 mg in the AM and 500mg  at night, Disp: , Rfl:  .  metolazone (ZAROXOLYN) 2.5 MG tablet, Take 2.5 mg by mouth as needed (for shortness of breath). , Disp: , Rfl:  .  metoprolol succinate (TOPROL-XL) 50 MG 24 hr tablet, Take 1 tablet (50 mg total) by mouth 2 (two) times daily., Disp: 270 tablet, Rfl: 3 .  rivaroxaban (XARELTO) 20 MG TABS tablet, Take 1 tablet (20 mg total) by mouth daily., Disp: 30 tablet, Rfl: 4 .  Vitamin D, Ergocalciferol, (DRISDOL) 50000 UNITS CAPS, Take 50,000 Units by mouth every 7 (seven) days. Takes on Thursday, Disp: , Rfl:   Past Medical History: Past Medical History:  Diagnosis Date  . Anxiety   . Asthma   . CHF (congestive heart failure) ([place])   . Chronic bronchitis ([place])   . GERD (gastroesophageal reflux disease)   . High cholesterol   . History of hiatal hernia   . HTN (hypertension)   . Mild aortic sclerosis ([place])   . Morbid obesity ([place])   . Osteoporosis   . Persistent atrial fibrillation ([place])   . Scoliosis   . Spinal stenosis   . Type II diabetes mellitus ([place])   . Vitamin D deficiency  Tobacco Use: History  Smoking Status  . Never Smoker  Smokeless Tobacco  . Never Used    Labs: Recent Review Flowsheet Data    Labs for ITP Cardiac and Pulmonary Rehab Latest Ref Rng & Units 06/01/2015   Hemoglobin A1c 4.8 - 5.6 % 8.1(H)      Capillary Blood Glucose: Lab Results  Component Value Date   GLUCAP 187 (H) 06/02/2015   GLUCAP 272 (H) 06/01/2015   GLUCAP 115 (H) 06/01/2015   GLUCAP 368 (H) 06/01/2015   GLUCAP 203 (H) 06/01/2015     ADL UCSD:   Pulmonary Function Assessment:     Pulmonary Function Assessment - 03/20/16 1046      Breath   Bilateral Breath Sounds Clear   Shortness of Breath Yes;Fear of Shortness of Breath;Limiting activity;Panic with Shortness of Breath      Exercise Target Goals: Date:  03/24/16  Exercise Program Goal: Individual exercise prescription set with THRR, safety & activity barriers. Participant demonstrates ability to understand and report RPE using BORG scale, to self-measure pulse accurately, and to acknowledge the importance of the exercise prescription.  Exercise Prescription Goal: Starting with aerobic activity 30 plus minutes a day, 3 days per week for initial exercise prescription. Provide home exercise prescription and guidelines that participant acknowledges understanding prior to discharge.  Activity Barriers & Risk Stratification:     Activity Barriers & Cardiac Risk Stratification - 03/20/16 1046      Activity Barriers & Cardiac Risk Stratification   Activity Barriers Deconditioning;Shortness of Breath;Balance Concerns      6 Minute Walk:     6 Minute Walk    Row Name 03/24/16 1514         6 Minute Walk   Phase Initial     Distance 757 feet     Walk Time -  4 minutes 30 seconds     # of Rest Breaks 1  1 minute and 30 seconds     MPH 1.43     METS 2.07     RPE 14     Perceived Dyspnea  2     Symptoms Yes (comment)     Comments Needed wheelchair due to leg weakness and dizziness     Resting HR 62 bpm     Resting BP 144/82     Max Ex. HR 97 bpm     Max Ex. BP 148/84     2 Minute Post BP 120/86       Interval HR   Baseline HR 62     1 Minute HR 67     2 Minute HR 82     3 Minute HR 94     4 Minute HR 94     5 Minute HR 97     6 Minute HR 86     2 Minute Post HR 68     Interval Heart Rate? Yes       Interval Oxygen   Interval Oxygen? Yes     Baseline Oxygen Saturation % 95 %     Baseline Liters of Oxygen 0 L     1 Minute Oxygen Saturation % 96 %     1 Minute Liters of Oxygen 0 L     2 Minute Oxygen Saturation % 95 %     2 Minute Liters of Oxygen 0 L     3 Minute Oxygen Saturation % 95 %     3 Minute Liters of Oxygen 0 L  4 Minute Oxygen Saturation % 95 %     4 Minute Liters of Oxygen 0 L     5 Minute Oxygen  Saturation % 95 %     5 Minute Liters of Oxygen 0 L     6 Minute Oxygen Saturation % 94 %     6 Minute Liters of Oxygen 0 L     2 Minute Post Oxygen Saturation % 97 %     2 Minute Post Liters of Oxygen 0 L        Initial Exercise Prescription:     Initial Exercise Prescription - 03/24/16 1500      Date of Initial Exercise RX and Referring Provider   Date 03/24/16   Referring Provider Dr. Vaughan Browner     NuStep   Level 1   Minutes 17   METs 1.5     Arm Ergometer   Level 1   Minutes 17     Track   Laps 5   Minutes 17     Prescription Details   Frequency (times per week) 2   Duration Progress to 45 minutes of aerobic exercise without signs/symptoms of physical distress     Intensity   THRR 40-80% of Max Heartrate 60-121   Ratings of Perceived Exertion 11-13   Perceived Dyspnea 0-4     Progression   Progression Continue progressive overload as per policy without signs/symptoms or physical distress.     Resistance Training   Training Prescription Yes   Weight orange band   Reps 10-12      Perform Capillary Blood Glucose checks as needed.  Exercise Prescription Changes:   Exercise Comments:   Discharge Exercise Prescription (Final Exercise Prescription Changes):    Nutrition:  Target Goals: Understanding of nutrition guidelines, daily intake of sodium 1500mg , cholesterol 200mg , calories 30% from fat and 7% or less from saturated fats, daily to have 5 or more servings of fruits and vegetables.  Biometrics:     Pre Biometrics - 03/20/16 1232      Pre Biometrics   Grip Strength 32 kg       Nutrition Therapy Plan and Nutrition Goals:   Nutrition Discharge: Rate Your Plate Scores:   Psychosocial: Target Goals: Acknowledge presence or absence of depression, maximize coping skills, provide positive support system. Participant is able to verbalize types and ability to use techniques and skills needed for reducing stress and depression.  Initial  Review & Psychosocial Screening:     Initial Psych Review & Screening - 03/20/16 1106      Initial Review   Current issues with Current Abuse or Neglect to [place]? Yes     Barriers   Psychosocial barriers to participate in program Psychosocial barriers identified (see note)     Screening Interventions   Interventions Encouraged to exercise      Quality of Life Scores:   PHQ-9: Recent Review Flowsheet Data    Depression screen [facility] 2/9 03/20/2016 09/18/2015   Decreased Interest 0 0   Down, Depressed, Hopeless 0 0   PHQ - 2 Score 0 0      Psychosocial Evaluation and Intervention:     Psychosocial Evaluation - 03/20/16 1120      Psychosocial Evaluation & Interventions   Interventions (P)  Encouraged to exercise with the program and follow exercise prescription      Psychosocial Re-Evaluation:  Education: Education Goals: Education classes will be provided on  a weekly basis, covering required topics. Participant will state understanding/return demonstration of topics presented.  Learning Barriers/Preferences:     Learning Barriers/Preferences - 03/20/16 1046      Learning Barriers/Preferences   Learning Barriers None   Learning Preferences Group Instruction;Written Material;Skilled Demonstration      Education Topics: Risk Factor Reduction:  -Group instruction that is supported by a PowerPoint presentation. Instructor discusses the definition of a risk factor, different risk factors for pulmonary disease, and how the heart and lungs work together.     Nutrition for Pulmonary Patient:  -Group instruction provided by PowerPoint slides, verbal discussion, and written materials to support subject matter. The instructor gives an explanation and review of healthy diet recommendations, which includes a discussion on weight management, recommendations for fruit and vegetable consumption, as well as protein, fluid, caffeine,  fiber, sodium, sugar, and alcohol. Tips for eating when patients are short of breath are discussed.   Pursed Lip Breathing:  -Group instruction that is supported by demonstration and informational handouts. Instructor discusses the benefits of pursed lip and diaphragmatic breathing and detailed demonstration on how to preform both.     Oxygen Safety:  -Group instruction provided by PowerPoint, verbal discussion, and written material to support subject matter. There is an overview of "What is Oxygen" and "Why do we need it".  Instructor also reviews how to create a safe environment for oxygen use, the importance of using oxygen as prescribed, and the risks of noncompliance. There is a brief discussion on traveling with oxygen and resources the patient may utilize.   Oxygen Equipment:  -Group instruction provided by [facility] Staff utilizing handouts, written materials, and equipment demonstrations.   Signs and Symptoms:  -Group instruction provided by written material and verbal discussion to support subject matter. Warning signs and symptoms of infection, stroke, and heart attack are reviewed and when to call the physician/911 reinforced. Tips for preventing the spread of infection discussed.   Advanced Directives:  -Group instruction provided by verbal instruction and written material to support subject matter. Instructor reviews Advanced Directive laws and proper instruction for filling out document.   Pulmonary Video:  -Group video education that reviews the importance of medication and oxygen compliance, exercise, good nutrition, pulmonary hygiene, and pursed lip and diaphragmatic breathing for the pulmonary patient.   Exercise for the Pulmonary Patient:  -Group instruction that is supported by a PowerPoint presentation. Instructor discusses benefits of exercise, core components of exercise, frequency, duration, and intensity of an exercise routine, importance of utilizing pulse  oximetry during exercise, safety while exercising, and options of places to exercise outside of rehab.     Pulmonary Medications:  -Verbally interactive group education provided by instructor with focus on inhaled medications and proper administration.   Anatomy and Physiology of the Respiratory System and Intimacy:  -Group instruction provided by PowerPoint, verbal discussion, and written material to support subject matter. Instructor reviews respiratory cycle and anatomical components of the respiratory system and their functions. Instructor also reviews differences in obstructive and restrictive respiratory diseases with examples of each. Intimacy, Sex, and Sexuality differences are reviewed with a discussion on how relationships can change when diagnosed with pulmonary disease. Common sexual concerns are reviewed.   Knowledge Questionnaire Score:   Core Components/Risk Factors/Patient Goals at Admission:     Personal Goals and Risk Factors at Admission - 03/20/16 1047      Core Components/Risk Factors/Patient Goals on Admission    Weight Management Yes;Obesity   Intervention Obesity: Provide education  and appropriate resources to help participant work on and attain dietary goals.;Weight Management/Obesity: Establish reasonable short term and long term weight goals.   Expected Outcomes Weight Loss: Understanding of general recommendations for a balanced deficit meal plan, which promotes 1-2 lb weight loss per week and includes a negative energy balance of [phone] kcal/d   Increase Strength and Stamina Yes   Intervention Provide advice, education, support and counseling about physical activity/exercise needs.;Develop an individualized exercise prescription for aerobic and resistive training based on initial evaluation findings, risk stratification, comorbidities and participant's personal goals.   Expected Outcomes Achievement of increased cardiorespiratory fitness and enhanced  flexibility, muscular endurance and strength shown through measurements of functional capacity and personal statement of participant.   Improve shortness of breath with ADL's Yes   Intervention Provide education, individualized exercise plan and daily activity instruction to help decrease symptoms of SOB with activities of daily living.   Expected Outcomes Short Term: Achieves a reduction of symptoms when performing activities of daily living.   Develop more efficient breathing techniques such as purse lipped breathing and diaphragmatic breathing; and practicing self-pacing with activity Yes   Intervention Provide education, demonstration and support about specific breathing techniuqes utilized for more efficient breathing. Include techniques such as pursed lipped breathing, diaphragmatic breathing and self-pacing activity.   Expected Outcomes Short Term: Participant will be able to demonstrate and use breathing techniques as needed throughout daily activities.   Heart Failure Yes   Intervention Provide a combined exercise and nutrition program that is supplemented with education, support and counseling about heart failure. Directed toward relieving symptoms such as shortness of breath, decreased exercise tolerance, and extremity edema.      Core Components/Risk Factors/Patient Goals Review:    Core Components/Risk Factors/Patient Goals at Discharge (Final Review):    ITP Comments:   Comments:

## 2016-03-25 NOTE — Progress Notes (Signed)
[facility] PREP Weekly Session   Patient Details  Name: Kristine [name] MRN: [id number] Date of Birth: [birth year] Age: 71 y.o. PCP: Tivis Ringer, MD  Vitals:   03/25/16 1359  Weight: 257 lb 12.8 oz (116.9 kg)        [name] Weekly seesion - 03/25/16 1300      Weekly Session   Topic Discussed Goal setting and welcome to the program;Expectations and non-scale victories   Minutes exercised this week 0 minutes   Classes attended to date 2    This is her first week meeting with her trainer.    Kristine [name] 03/25/2016, 1:59 PM

## 2016-03-31 ENCOUNTER — Encounter (HOSPITAL_COMMUNITY)
Admission: RE | Admit: 2016-03-31 | Discharge: 2016-03-31 | Disposition: A | Discharge status: Home / Self Care | Payer: Medicare HMO | Source: Ambulatory Visit | Attending: Pulmonary Disease | Admitting: Pulmonary Disease

## 2016-03-31 ENCOUNTER — Other Ambulatory Visit: Payer: Self-pay | Admitting: Cardiovascular Disease

## 2016-03-31 DIAGNOSIS — R0602 Shortness of breath: Secondary | ICD-10-CM | POA: Diagnosis not present

## 2016-03-31 DIAGNOSIS — J984 Other disorders of lung: Secondary | ICD-10-CM

## 2016-03-31 DIAGNOSIS — T462X5A Adverse effect of other antidysrhythmic drugs, initial encounter: Principal | ICD-10-CM

## 2016-03-31 LAB — GLUCOSE, CAPILLARY: Glucose-Capillary: 226 mg/dL — ABNORMAL HIGH (ref 65–99)

## 2016-03-31 NOTE — Progress Notes (Signed)
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 Kristine [name] is a type 2 diabetic on insulin therapy. Kristine [name] presented to her first day of exercise only eating a rice krispy treat and a glass of grape juice. During orientation it was verbally stressed and handout was given regarding protein consumption prior to exercise. CBG checked 223. Diet was reinforced and patient was not allowed to exercise. She was discharged in stable condition.

## 2016-04-01 NOTE — Progress Notes (Signed)
[facility] PREP Weekly Session   Patient Details  Name: Kristine [name] MRN: [id number] Date of Birth: February 21, 1946 Age: 71 y.o. PCP: Tivis Ringer, MD  Vitals:   04/01/16 1322  Weight: 263 lb 4.8 oz (119.4 kg)        [name] Weekly seesion - 04/01/16 1300      Weekly Session   Topic Discussed Other  portion control    Minutes exercised this week 60 minutes  cardio   Classes attended to date 3     Nutrition celebration:  Reading labels Barriers/struggles: Constant b/s, salt, must read label.   [name] 04/01/2016, 1:22 PM

## 2016-04-02 ENCOUNTER — Ambulatory Visit: Payer: Medicare HMO

## 2016-04-02 ENCOUNTER — Encounter (HOSPITAL_COMMUNITY)
Admission: RE | Admit: 2016-04-02 | Discharge: 2016-04-02 | Disposition: A | Discharge status: Home / Self Care | Payer: Medicare HMO | Source: Ambulatory Visit | Attending: Pulmonary Disease | Admitting: Pulmonary Disease

## 2016-04-02 VITALS — Wt 262.6 lb

## 2016-04-02 DIAGNOSIS — T462X5A Adverse effect of other antidysrhythmic drugs, initial encounter: Principal | ICD-10-CM

## 2016-04-02 DIAGNOSIS — J984 Other disorders of lung: Secondary | ICD-10-CM

## 2016-04-02 DIAGNOSIS — R0602 Shortness of breath: Secondary | ICD-10-CM | POA: Diagnosis not present

## 2016-04-02 NOTE — Progress Notes (Signed)
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 She had to leave pulmonary rehab at 12:00. She only had the time to ambulate 5 min after education. At discharge she complained of chronic intermittent back pain 7/10.

## 2016-04-07 ENCOUNTER — Other Ambulatory Visit: Payer: Self-pay | Admitting: Cardiovascular Disease

## 2016-04-07 ENCOUNTER — Encounter (HOSPITAL_COMMUNITY)
Admission: RE | Admit: 2016-04-07 | Discharge: 2016-04-07 | Disposition: A | Discharge status: Home / Self Care | Payer: Medicare HMO | Source: Ambulatory Visit | Attending: Pulmonary Disease | Admitting: Pulmonary Disease

## 2016-04-07 DIAGNOSIS — R0602 Shortness of breath: Secondary | ICD-10-CM | POA: Diagnosis not present

## 2016-04-07 LAB — GLUCOSE, CAPILLARY
GLUCOSE-CAPILLARY: 86 mg/dL (ref 65–99)
Glucose-Capillary: 73 mg/dL (ref 65–99)

## 2016-04-07 NOTE — Progress Notes (Signed)
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 Her pre-exercise CBG was 73. She was given ginger ale and consulted with RD. Her recheck was 86. Peanut butter and crackers with H2O given. Patient was discharged in stable condition

## 2016-04-07 NOTE — Progress Notes (Signed)
Kristine [name] 71 y.o. female Nutrition Note Spoke with pt. Pt is obese.  Pt's pre-exercise CBG 74 mg/dL. Pt given a gingerale. CBG after gingerale 86 mg/dL. Pt reports she has changed her diet since I gave her the 1500 kcal menu handout. Pt is diabetic. Pt took her insulin without checking her CBG. Pt ate 2 boiled eggs, an apple, and a biscuit for breakfast. Per discussion, "I stick myself a lot and I get tired of checking my blood sugar all of the time."  Pt checks CBG's 1-2 times a day.  When fasting CBG's checked, CBG reportedly "around 126 mg/dL." Last A1c indicates CBG's not optimally controlled. Pt expressed understanding of the information reviewed via feedback method.    Lab Results  Component Value Date   HGBA1C 8.1 (H) 06/01/2015   Nutrition Diagnosis ? Food-and nutrition-related knowledge deficit related to lack of exposure to information as related to diagnosis of pulmonary disease ? Obesity related to excessive energy intake as evidenced by a BMI of 41.1  Nutrition Intervention ? Pt's individual nutrition plan and goals reviewed with pt. ? Pt to attend the Nutrition and Lung Disease class ? Pt instructed to check fasting CBG's Tues/Thursday (exercise days). Pt to take her Levemir and Metformin and take 1/2 of her Humalog on exercise days if her fasting CBG is < 150 mg/dL. ? Pt to discuss using a long-acting basal insulin to decrease the number of injections she has to take.  ? Pt to discuss SSI scale adjusted to pt's pre-meal CBG. ? Continual client-centered nutrition education by RD, as part of interdisciplinary care. Goal(s) 1. Identify food quantities necessary to achieve wt loss of  -2# per week to a goal wt loss of 2.7-10.9 kg (6-24 lb) at graduation from pulmonary rehab. 2. Use pre-/post meal and/or exercise CBG's and A1c to determine whether adjustments in food/meal planning will be beneficial or if any meds need to be combined with nutrition therapy. Monitor and  Evaluate progress toward nutrition goal with team.   Derek Mound, M.Ed, RD, LDN, CDE 04/07/2016 2:42 PM

## 2016-04-09 ENCOUNTER — Encounter (HOSPITAL_COMMUNITY): Admission: RE | Admit: 2016-04-09 | Payer: Medicare HMO | Source: Ambulatory Visit

## 2016-04-10 ENCOUNTER — Other Ambulatory Visit: Payer: Self-pay | Admitting: *Deleted

## 2016-04-10 MED ORDER — AMIODARONE HCL 200 MG PO TABS: ORAL_TABLET | ORAL | 2 refills | Status: DC

## 2016-04-14 ENCOUNTER — Encounter (HOSPITAL_COMMUNITY)
Admission: RE | Admit: 2016-04-14 | Discharge: 2016-04-14 | Disposition: A | Discharge status: Home / Self Care | Payer: Medicare HMO | Source: Ambulatory Visit | Attending: Pulmonary Disease | Admitting: Pulmonary Disease

## 2016-04-14 VITALS — Wt 259.0 lb

## 2016-04-14 DIAGNOSIS — T462X5A Adverse effect of other antidysrhythmic drugs, initial encounter: Principal | ICD-10-CM

## 2016-04-14 DIAGNOSIS — R0602 Shortness of breath: Secondary | ICD-10-CM | POA: Diagnosis not present

## 2016-04-14 DIAGNOSIS — J984 Other disorders of lung: Secondary | ICD-10-CM

## 2016-04-14 LAB — GLUCOSE, CAPILLARY: Glucose-Capillary: 141 mg/dL — ABNORMAL HIGH (ref 65–99)

## 2016-04-14 NOTE — Progress Notes (Signed)
Pulmonary Individual Treatment Plan  Patient Details  Name: Kristine [name] MRN: [id number] Date of Birth: 04/28/1945 Referring Provider:   April Manson Pulmonary Rehab Walk Test from 03/24/2016 in [place]  Referring Provider  Dr. Vaughan Browner      Initial Encounter Date:  Flowsheet Row Pulmonary Rehab Walk Test from 03/24/2016 in [place]  Date  03/24/16  Referring Provider  Dr. Vaughan Browner      Visit Diagnosis: Amiodarone pulmonary toxicity  Patient's Home Medications on Admission:   Current Outpatient Prescriptions:  .  ALPRAZolam (XANAX) 0.25 MG tablet, Take 0.125-0.25 mg by mouth 2 (two) times daily as needed for anxiety. , Disp: , Rfl: 0 .  amiodarone (PACERONE) 200 MG tablet, Take 1 pill on Mondays, Wednesdays, and Fridays only, Disp: 180 tablet, Rfl: 2 .  BD PEN NEEDLE NANO U/F 32G X 4 MM MISC, Inject 1 each as directed See admin instructions. Use pen needles with insulin pens daily, Disp: , Rfl: 11 .  clindamycin (CLEOCIN) 300 MG capsule, TAKE 2 CAPSULES BY MOUTH 1 HOUR PRIOR TO DENTAL APPOINTMENT, Disp: , Rfl: 0 .  esomeprazole (NEXIUM) 40 MG capsule, Take 40 mg by mouth daily as needed (for acid reflux). , Disp: , Rfl:  .  furosemide (LASIX) 80 MG tablet, TAKE 1 TABLET (80 MG TOTAL) BY MOUTH 2 (TWO) TIMES DAILY., Disp: 180 tablet, Rfl: 3 .  gabapentin (NEURONTIN) 100 MG capsule, Take 100 mg by mouth at bedtime. , Disp: , Rfl:  .  HUMALOG KWIKPEN 200 UNIT/ML SOPN, Inject 12 Units into the skin 3 (three) times daily with meals. , Disp: , Rfl: 6 .  insulin detemir (LEVEMIR) 100 UNIT/ML injection, Inject 54 Units into the skin 2 (two) times daily. , Disp: , Rfl:  .  KLOR-CON 10 10 MEQ tablet, Take 20 mEq by mouth daily., Disp: , Rfl: 11 .  levothyroxine (SYNTHROID, LEVOTHROID) 25 MCG tablet, Take 1 tablet by mouth daily., Disp: , Rfl:  .  losartan (COZAAR) 100 MG tablet, TAKE 1 TABLET BY MOUTH EVERY DAY, Disp: 90 tablet,  Rfl: 3 .  Melatonin 1 MG CAPS, Take by mouth at bedtime., Disp: , Rfl:  .  metFORMIN (GLUCOPHAGE) 500 MG tablet, 1000 mg in the AM and 500mg  at night, Disp: , Rfl:  .  metolazone (ZAROXOLYN) 2.5 MG tablet, Take 2.5 mg by mouth as needed (for shortness of breath). , Disp: , Rfl:  .  metoprolol succinate (TOPROL-XL) 50 MG 24 hr tablet, Take 1 tablet (50 mg total) by mouth 2 (two) times daily., Disp: 270 tablet, Rfl: 3 .  rivaroxaban (XARELTO) 20 MG TABS tablet, Take 1 tablet (20 mg total) by mouth daily., Disp: 30 tablet, Rfl: 4 .  Vitamin D, Ergocalciferol, (DRISDOL) 50000 UNITS CAPS, Take 50,000 Units by mouth every 7 (seven) days. Takes on Thursday, Disp: , Rfl:   Past Medical History: Past Medical History:  Diagnosis Date  . Anxiety   . Asthma   . CHF (congestive heart failure) ([place])   . Chronic bronchitis ([place])   . GERD (gastroesophageal reflux disease)   . High cholesterol   . History of hiatal hernia   . HTN (hypertension)   . Mild aortic sclerosis ([place])   . Morbid obesity ([place])   . Osteoporosis   . Persistent atrial fibrillation ([place])   . Scoliosis   . Spinal stenosis   . Type II diabetes mellitus ([place])   . Vitamin D deficiency  Tobacco Use: History  Smoking Status  . Never Smoker  Smokeless Tobacco  . Never Used    Labs: Recent Review Flowsheet Data    Labs for ITP Cardiac and Pulmonary Rehab Latest Ref Rng & Units 06/01/2015   Hemoglobin A1c 4.8 - 5.6 % 8.1(H)      Capillary Blood Glucose: Lab Results  Component Value Date   GLUCAP 86 04/07/2016   GLUCAP 73 04/07/2016   GLUCAP 226 (H) 03/31/2016   GLUCAP 187 (H) 06/02/2015   GLUCAP 272 (H) 06/01/2015       POCT Glucose    Row Name 04/02/16 1231 04/07/16 1212           POCT Blood Glucose   Pre-Exercise 164 mg/dL 73 mg/dL      Pre-Exercise #2  - 86 mg/dL         ADL UCSD:     Pulmonary Assessment Scores    Row Name 04/02/16 1653         ADL UCSD   ADL Phase Entry     SOB Score total  102        Pulmonary Function Assessment:     Pulmonary Function Assessment - 03/20/16 1046      Breath   Bilateral Breath Sounds Clear   Shortness of Breath Yes;Fear of Shortness of Breath;Limiting activity;Panic with Shortness of Breath      Exercise Target Goals:    Exercise Program Goal: Individual exercise prescription set with THRR, safety & activity barriers. Participant demonstrates ability to understand and report RPE using BORG scale, to self-measure pulse accurately, and to acknowledge the importance of the exercise prescription.  Exercise Prescription Goal: Starting with aerobic activity 30 plus minutes a day, 3 days per week for initial exercise prescription. Provide home exercise prescription and guidelines that participant acknowledges understanding prior to discharge.  Activity Barriers & Risk Stratification:     Activity Barriers & Cardiac Risk Stratification - 03/20/16 1046      Activity Barriers & Cardiac Risk Stratification   Activity Barriers Deconditioning;Shortness of Breath;Balance Concerns      6 Minute Walk:     6 Minute Walk    Row Name 03/24/16 1514         6 Minute Walk   Phase Initial     Distance 757 feet     Walk Time -  4 minutes 30 seconds     # of Rest Breaks 1  1 minute and 30 seconds     MPH 1.43     METS 2.07     RPE 14     Perceived Dyspnea  2     Symptoms Yes (comment)     Comments Needed wheelchair due to leg weakness and dizziness     Resting HR 62 bpm     Resting BP 144/82     Max Ex. HR 97 bpm     Max Ex. BP 148/84     2 Minute Post BP 120/86       Interval HR   Baseline HR 62     1 Minute HR 67     2 Minute HR 82     3 Minute HR 94     4 Minute HR 94     5 Minute HR 97     6 Minute HR 86     2 Minute Post HR 68     Interval Heart Rate? Yes       Interval Oxygen  Interval Oxygen? Yes     Baseline Oxygen Saturation % 95 %     Baseline Liters of Oxygen 0 L     1 Minute Oxygen Saturation % 96 %      1 Minute Liters of Oxygen 0 L     2 Minute Oxygen Saturation % 95 %     2 Minute Liters of Oxygen 0 L     3 Minute Oxygen Saturation % 95 %     3 Minute Liters of Oxygen 0 L     4 Minute Oxygen Saturation % 95 %     4 Minute Liters of Oxygen 0 L     5 Minute Oxygen Saturation % 95 %     5 Minute Liters of Oxygen 0 L     6 Minute Oxygen Saturation % 94 %     6 Minute Liters of Oxygen 0 L     2 Minute Post Oxygen Saturation % 97 %     2 Minute Post Liters of Oxygen 0 L        Initial Exercise Prescription:     Initial Exercise Prescription - 03/24/16 1500      Date of Initial Exercise RX and Referring Provider   Date 03/24/16   Referring Provider Dr. Vaughan Browner     NuStep   Level 1   Minutes 17   METs 1.5     Arm Ergometer   Level 1   Minutes 17     Track   Laps 5   Minutes 17     Prescription Details   Frequency (times per week) 2   Duration Progress to 45 minutes of aerobic exercise without signs/symptoms of physical distress     Intensity   THRR 40-80% of Max Heartrate 60-121   Ratings of Perceived Exertion 11-13   Perceived Dyspnea 0-4     Progression   Progression Continue progressive overload as per policy without signs/symptoms or physical distress.     Resistance Training   Training Prescription Yes   Weight orange band   Reps 10-12      Perform Capillary Blood Glucose checks as needed.  Exercise Prescription Changes:     Exercise Prescription Changes    Row Name 04/02/16 1200 04/07/16 1200           Response to Exercise   Blood Pressure (Admit) 158/72 130/70      Blood Pressure (Exit) 148/80  -      Heart Rate (Admit) 67 bpm 54 bpm      Heart Rate (Exit) 55 bpm  -      Oxygen Saturation (Admit) 93 % 94 %      Oxygen Saturation (Exit) 94 %  -      Comments had to leave early low cbg, treatment and consult with dietition      Duration Progress to 45 minutes of aerobic exercise without signs/symptoms of physical distress  -         Resistance Training   Training Prescription Yes  -      Weight orange  -      Reps 10-12  -        Oxygen   Oxygen -  room air  -        Track   Laps 2  -      Minutes 5  -         Exercise Comments:     Exercise Comments  [place] Name 04/13/16 1651           Exercise Comments Patient has attended three exercise sessions but has not exercised yet. She is having complications with blood sugar.          Discharge Exercise Prescription (Final Exercise Prescription Changes):     Exercise Prescription Changes - 04/07/16 1200      Response to Exercise   Blood Pressure (Admit) 130/70   Heart Rate (Admit) 54 bpm   Oxygen Saturation (Admit) 94 %   Comments low cbg, treatment and consult with dietition      Nutrition:  Target Goals: Understanding of nutrition guidelines, daily intake of sodium 1500mg , cholesterol 200mg , calories 30% from fat and 7% or less from saturated fats, daily to have 5 or more servings of fruits and vegetables.  Biometrics:     Pre Biometrics - 03/20/16 1232      Pre Biometrics   Grip Strength 32 kg       Nutrition Therapy Plan and Nutrition Goals:     Nutrition Therapy & Goals - 04/02/16 1219      Nutrition Therapy   Diet Carb Modified, Low Sodium      Personal Nutrition Goals   Personal Goal #1 1-2 lb wt loss/week to a wt loss goal of 6-24 lb at graduation from [facility], educate and counsel regarding individualized specific dietary modifications aiming towards targeted core components such as weight, hypertension, lipid management, diabetes, heart failure and other comorbidities.   Expected Outcomes Short Term Goal: Understand basic principles of dietary content, such as calories, fat, sodium, cholesterol and nutrients.;Long Term Goal: Adherence to prescribed nutrition plan.      Nutrition Discharge: Rate Your Plate Scores:   Psychosocial: Target Goals: Acknowledge presence  or absence of depression, maximize coping skills, provide positive support system. Participant is able to verbalize types and ability to use techniques and skills needed for reducing stress and depression.  Initial Review & Psychosocial Screening:     Initial Psych Review & Screening - 03/20/16 1106      Initial Review   Current issues with Current Abuse or Neglect to [place]? Yes     Barriers   Psychosocial barriers to participate in program Psychosocial barriers identified (see note)     Screening Interventions   Interventions Encouraged to exercise      Quality of Life Scores:     Quality of Life - 04/02/16 1654      Quality of Life Scores   Health/Function Pre 16.4 %   Socioeconomic Pre 19.75 %   Psych/Spiritual Pre 23.43 %   Family Pre 18 %   GLOBAL Pre 18.82 %      PHQ-9: Recent Review Flowsheet Data    Depression screen [facility] 2/9 03/20/2016 09/18/2015   Decreased Interest 0 0   Down, Depressed, Hopeless 0 0   PHQ - 2 Score 0 0      Psychosocial Evaluation and Intervention:     Psychosocial Evaluation - 03/20/16 1120      Psychosocial Evaluation & Interventions   Interventions (P)  Encouraged to exercise with the program and follow exercise prescription      Psychosocial Re-Evaluation:     Psychosocial Re-Evaluation    [place] Name 04/14/16 0742             Psychosocial Re-Evaluation   Interventions Encouraged to  attend Pulmonary Rehabilitation for the exercise       Comments see comments section on ITP for psychosocial barriers          Education: Education Goals: Education classes will be provided on a weekly basis, covering required topics. Participant will state understanding/return demonstration of topics presented.  Learning Barriers/Preferences:     Learning Barriers/Preferences - 03/20/16 1046      Learning Barriers/Preferences   Learning Barriers None   Learning Preferences Group  Instruction;Written Material;Skilled Demonstration      Education Topics: How Lungs Work and Diseases: - Discuss the anatomy of the lungs and diseases that can affect the lungs, such as COPD.   Exercise: -Discuss the importance of exercise, FITT principles of exercise, normal and abnormal responses to exercise, and how to exercise safely.   Environmental Irritants: -Discuss types of environmental irritants and how to limit exposure to environmental irritants.   Meds/Inhalers and oxygen: - Discuss respiratory medications, definition of an inhaler and oxygen, and the proper way to use an inhaler and oxygen.   Energy Saving Techniques: - Discuss methods to conserve energy and decrease shortness of breath when performing activities of daily living.    Bronchial Hygiene / Breathing Techniques: - Discuss breathing mechanics, pursed-lip breathing technique,  proper posture, effective ways to clear airways, and other functional breathing techniques   Cleaning Equipment: - Provides group verbal and written instruction about the health risks of elevated stress, cause of high stress, and healthy ways to reduce stress.   Nutrition I: Fats: - Discuss the types of cholesterol, what cholesterol does to the body, and how cholesterol levels can be controlled.   Nutrition II: Labels: -Discuss the different components of food labels and how to read food labels.   Respiratory Infections: - Discuss the signs and symptoms of respiratory infections, ways to prevent respiratory infections, and the importance of seeking medical treatment when having a respiratory infection.   Stress I: Signs and Symptoms: - Discuss the causes of stress, how stress may lead to anxiety and depression, and ways to limit stress.   Stress II: Relaxation: -Discuss relaxation techniques to limit stress.   Oxygen for Home/Travel: - Discuss how to prepare for travel when on oxygen and proper ways to transport and  store oxygen to ensure safety.   Knowledge Questionnaire Score:     Knowledge Questionnaire Score - 04/02/16 1653      Knowledge Questionnaire Score   Pre Score 9/13      Core Components/Risk Factors/Patient Goals at Admission:     Personal Goals and Risk Factors at Admission - 03/20/16 1047      Core Components/Risk Factors/Patient Goals on Admission    Weight Management Yes;Obesity   Intervention Obesity: Provide education and appropriate resources to help participant work on and attain dietary goals.;Weight Management/Obesity: Establish reasonable short term and long term weight goals.   Expected Outcomes Weight Loss: Understanding of general recommendations for a balanced deficit meal plan, which promotes 1-2 lb weight loss per week and includes a negative energy balance of [phone] kcal/d   Increase Strength and Stamina Yes   Intervention Provide advice, education, support and counseling about physical activity/exercise needs.;Develop an individualized exercise prescription for aerobic and resistive training based on initial evaluation findings, risk stratification, comorbidities and participant's personal goals.   Expected Outcomes Achievement of increased cardiorespiratory fitness and enhanced flexibility, muscular endurance and strength shown through measurements of functional capacity and personal statement of participant.   Improve shortness of breath with  ADL's Yes   Intervention Provide education, individualized exercise plan and daily activity instruction to help decrease symptoms of SOB with activities of daily living.   Expected Outcomes Short Term: Achieves a reduction of symptoms when performing activities of daily living.   Develop more efficient breathing techniques such as purse lipped breathing and diaphragmatic breathing; and practicing self-pacing with activity Yes   Intervention Provide education, demonstration and support about specific breathing techniuqes  utilized for more efficient breathing. Include techniques such as pursed lipped breathing, diaphragmatic breathing and self-pacing activity.   Expected Outcomes Short Term: Participant will be able to demonstrate and use breathing techniques as needed throughout daily activities.   Heart Failure Yes   Intervention Provide a combined exercise and nutrition program that is supplemented with education, support and counseling about heart failure. Directed toward relieving symptoms such as shortness of breath, decreased exercise tolerance, and extremity edema.      Core Components/Risk Factors/Patient Goals Review:      Goals and Risk Factor Review    Row Name 04/14/16 0741             Core Components/Risk Factors/Patient Goals Review   Personal Goals Review Weight Management/Obesity;Sedentary;Develop more efficient breathing techniques such as purse lipped breathing and diaphragmatic breathing and practicing self-pacing with activity.;Increase Strength and Stamina;Improve shortness of breath with ADL's;Heart Failure;Diabetes       Review see comments section on ITP       Expected Outcomes see admission expected outcomes          Core Components/Risk Factors/Patient Goals at Discharge (Final Review):      Goals and Risk Factor Review - 04/14/16 0741      Core Components/Risk Factors/Patient Goals Review   Personal Goals Review Weight Management/Obesity;Sedentary;Develop more efficient breathing techniques such as purse lipped breathing and diaphragmatic breathing and practicing self-pacing with activity.;Increase Strength and Stamina;Improve shortness of breath with ADL's;Heart Failure;Diabetes   Review see comments section on ITP   Expected Outcomes see admission expected outcomes      ITP Comments:   Comments: Dagny has attended all 3 exercise sessions since admission however she has yet to exercise. Her diabetes is a significant psychosocial barrier to her exercise. She admits  being angry about having diabetes and because of her anger she is not following our directions/policies for diabetics while exercising. She ate sugary carbs and drank sugary carbs prior to exercise with no consumption of protein. She forgets her CBG meter. She has had 2 consults with the RD. Hopefully she will be on track to begin to exercise this week and make progress towards her pulmonary rehab goals over the next 30 days. Recommend continued exercise, life style modification, education, and utilization of breathing techniques to increase stamina and strength and decrease shortness of breath with exertion.

## 2016-04-14 NOTE — Progress Notes (Signed)
Daily Session Note  Patient Details  Name: Kristine [name] MRN: [id number] Date of Birth: Oct 25, 1945 Referring Provider:   April Manson Pulmonary Rehab Walk Test from 03/24/2016 in [place]  Referring Provider  Dr. Vaughan Browner      Encounter Date: 04/14/2016  Check In:     Session Check In - 04/14/16 1027      Check-In   Location [facility]   Staff Present Rosebud Poles, RN, BSN;Molly diVincenzo, MS, ACSM RCEP, Exercise Physiologist;Lisa Ysidro Evert, RN;Portia Rollene Rotunda, RN, BSN   Supervising physician immediately available to respond to emergencies [facility] immediately available   Physician(s) Dr. Wyline Copas   Medication changes reported     No   Fall or balance concerns reported    No   Warm-up and Cool-down Performed as group-led instruction   Resistance Training Performed Yes   VAD Patient? No     Pain Assessment   Currently in Pain? No/denies   Multiple Pain Sites No      Capillary Blood Glucose: Results for orders placed or performed during the hospital encounter of 04/14/16 (from the past 24 hour(s))  Glucose, capillary     Status: Abnormal   Collection Time: 04/14/16 11:47 AM  Result Value Ref Range   Glucose-Capillary 141 (H) 65 - 99 mg/dL        Exercise Prescription Changes - 04/14/16 1200      Response to Exercise   Blood Pressure (Admit) 122/64   Blood Pressure (Exercise) 136/70   Blood Pressure (Exit) 120/62   Heart Rate (Admit) 55 bpm   Heart Rate (Exercise) 69 bpm   Heart Rate (Exit) 53 bpm   Oxygen Saturation (Admit) 93 %   Oxygen Saturation (Exercise) 90 %   Oxygen Saturation (Exit) 95 %   Rating of Perceived Exertion (Exercise) 13   Perceived Dyspnea (Exercise) 1   Duration Progress to 45 minutes of aerobic exercise without signs/symptoms of physical distress   Intensity --  40-80% HRR     Progression   Progression Continue to progress workloads to maintain intensity without signs/symptoms of  physical distress.     Resistance Training   Training Prescription Yes   Weight orange   Reps 10-12  10 minutes of strength training     Interval Training   Interval Training No     NuStep   Level 1   Minutes 17   METs 1.3     Arm Ergometer   Level 1   Minutes 17     Track   Laps 2   Minutes 17     Goals Met:  Exercise tolerated well Strength training completed today  Goals Unmet:  Not Applicable  Comments: Service time is from 1030 to 1200    Dr. Rush Farmer is Medical Director for Pulmonary Rehab at [facility].

## 2016-04-15 NOTE — Progress Notes (Signed)
[facility] PREP Weekly Session   Patient Details  Name: Kristine [name] MRN: [id number] Date of Birth: 1945/08/04 Age: 71 y.o. PCP: Tivis Ringer, MD  Vitals:   04/15/16 1418  Weight: 259 lb (117.5 kg)        [name] Weekly seesion - 04/15/16 1400      Weekly Session   Topic Discussed Finding support   Minutes exercised this week 60 minutes   Classes attended to date 4   Comments "grateful for family"       [name] 04/15/2016, 2:18 PM

## 2016-04-16 ENCOUNTER — Encounter (HOSPITAL_COMMUNITY)
Admission: RE | Admit: 2016-04-16 | Discharge: 2016-04-16 | Disposition: A | Discharge status: Home / Self Care | Payer: Medicare HMO | Source: Ambulatory Visit | Attending: Pulmonary Disease | Admitting: Pulmonary Disease

## 2016-04-16 VITALS — Wt 259.3 lb

## 2016-04-16 DIAGNOSIS — T462X5A Adverse effect of other antidysrhythmic drugs, initial encounter: Principal | ICD-10-CM

## 2016-04-16 DIAGNOSIS — J984 Other disorders of lung: Secondary | ICD-10-CM

## 2016-04-16 DIAGNOSIS — R0602 Shortness of breath: Secondary | ICD-10-CM | POA: Diagnosis not present

## 2016-04-16 NOTE — Progress Notes (Signed)
Daily Session Note  Patient Details  Name: Kristine [name] MRN: [id number] Date of Birth: January 08, 1946 Referring Provider:   April Manson Pulmonary Rehab Walk Test from 03/24/2016 in [place]  Referring Provider  Dr. Vaughan Browner      Encounter Date: 04/16/2016  Check In:     Session Check In - 04/16/16 1030      Check-In   Location [facility]   Staff Present Trish Fountain, RN, BSN;Molly diVincenzo, MS, ACSM RCEP, Exercise Physiologist;Azaleah Usman Ysidro Evert, Felipe Drone, RN, [place] physician immediately available to respond to emergencies [facility] immediately available   Physician(s) Dr. Algis Liming   Medication changes reported     No   Fall or balance concerns reported    No   Warm-up and Cool-down Performed as group-led instruction   Resistance Training Performed Yes   VAD Patient? No     Pain Assessment   Currently in Pain? No/denies   Multiple Pain Sites No      Capillary Blood Glucose: No results found for this or any previous visit (from the past 24 hour(s)).     POCT Glucose - 04/16/16 1227      POCT Blood Glucose   Pre-Exercise 143 mg/dL  Gave her lemonaide to drink before exercise   Post-Exercise 152 mg/dL         Exercise Prescription Changes - 04/16/16 1200      Response to Exercise   Blood Pressure (Admit) 130/56   Blood Pressure (Exercise) 110/60   Blood Pressure (Exit) 118/60   Heart Rate (Admit) 55 bpm   Heart Rate (Exercise) 57 bpm   Heart Rate (Exit) 53 bpm   Oxygen Saturation (Admit) 95 %   Oxygen Saturation (Exercise) 97 %   Oxygen Saturation (Exit) 94 %   Rating of Perceived Exertion (Exercise) 13   Perceived Dyspnea (Exercise) 1   Duration Progress to 45 minutes of aerobic exercise without signs/symptoms of physical distress   Intensity THRR unchanged     Progression   Progression Continue to progress workloads to maintain intensity without signs/symptoms of physical  distress.     Resistance Training   Training Prescription Yes   Weight orange bands   Reps 10-12  10 minutes of strenght training     Interval Training   Interval Training No     NuStep   Level 1   Minutes 17   METs 1.5     Arm Ergometer   Level 1   Minutes 17     Goals Met:  Exercise tolerated well No report of cardiac concerns or symptoms Strength training completed today  Goals Unmet:  Not Applicable  Comments: Service time is from 1030 to 1205    Dr. Rush Farmer is Medical Director for Pulmonary Rehab at [facility].

## 2016-04-21 ENCOUNTER — Encounter (HOSPITAL_COMMUNITY)
Admission: RE | Admit: 2016-04-21 | Discharge: 2016-04-21 | Disposition: A | Discharge status: Home / Self Care | Payer: Medicare HMO | Source: Ambulatory Visit | Attending: Pulmonary Disease | Admitting: Pulmonary Disease

## 2016-04-22 DIAGNOSIS — R69 Illness, unspecified: Secondary | ICD-10-CM | POA: Diagnosis not present

## 2016-04-22 NOTE — Progress Notes (Signed)
[facility] PREP Weekly Session   Patient Details  Name: Kristine [name] MRN: [id number] Date of Birth: Feb 19, 1946 Age: 71 y.o. PCP: Tivis Ringer, MD  Vitals:   04/22/16 1306  Weight: 264 lb (119.7 kg)        [name] Weekly seesion - 04/22/16 1300      Weekly Session   Topic Discussed Calorie breakdown   Minutes exercised this week 60 minutes   Classes attended to date 5   Comments nutrition celebration:"no chips w/burger"       [name] 04/22/2016, 1:07 PM

## 2016-04-23 ENCOUNTER — Encounter (HOSPITAL_COMMUNITY)
Admission: RE | Admit: 2016-04-23 | Discharge: 2016-04-23 | Disposition: A | Discharge status: Home / Self Care | Payer: Medicare HMO | Source: Ambulatory Visit | Attending: Pulmonary Disease | Admitting: Pulmonary Disease

## 2016-04-23 VITALS — Wt 264.8 lb

## 2016-04-23 DIAGNOSIS — R0602 Shortness of breath: Secondary | ICD-10-CM | POA: Insufficient documentation

## 2016-04-23 DIAGNOSIS — T462X5A Adverse effect of other antidysrhythmic drugs, initial encounter: Secondary | ICD-10-CM

## 2016-04-23 DIAGNOSIS — J984 Other disorders of lung: Secondary | ICD-10-CM

## 2016-04-23 NOTE — Progress Notes (Signed)
Daily Session Note  Patient Details  Name: Kristine [name] MRN: [id number] Date of Birth: [birth year] Referring Provider:   April Manson Pulmonary Rehab Walk Test from 03/24/2016 in [place]  Referring Provider  Dr. Vaughan Browner      Encounter Date: 04/23/2016  Check In:     Session Check In - 04/23/16 1030      Check-In   Location [facility]   Staff Present Rosebud Poles, RN, BSN;Lisa Ysidro Evert, RN;Baldo Hufnagle Rollene Rotunda, RN, BSN   Supervising physician immediately available to respond to emergencies [facility] immediately available   Physician(s) Dr. [name]   Medication changes reported     No   Fall or balance concerns reported    No   Warm-up and Cool-down Performed as group-led instruction   Resistance Training Performed Yes   VAD Patient? No     Pain Assessment   Currently in Pain? No/denies   Multiple Pain Sites No      Capillary Blood Glucose: No results found for this or any previous visit (from the past 24 hour(s)).     POCT Glucose - 04/23/16 1247      POCT Blood Glucose   Pre-Exercise 179 mg/dL   Post-Exercise 156 mg/dL         Exercise Prescription Changes - 04/23/16 1245      Response to Exercise   Blood Pressure (Admit) 130/70   Blood Pressure (Exercise) 108/68   Blood Pressure (Exit) 110/70   Heart Rate (Admit) 56 bpm   Heart Rate (Exercise) 78 bpm   Heart Rate (Exit) 58 bpm   Oxygen Saturation (Admit) 97 %   Oxygen Saturation (Exercise) 95 %   Oxygen Saturation (Exit) 95 %   Rating of Perceived Exertion (Exercise) 13   Perceived Dyspnea (Exercise) 1   Duration Progress to 45 minutes of aerobic exercise without signs/symptoms of physical distress   Intensity THRR unchanged     Progression   Progression Continue to progress workloads to maintain intensity without signs/symptoms of physical distress.     Resistance Training   Training Prescription Yes   Weight orange bands   Reps 10-12  10  minutes of strenght training     Interval Training   Interval Training No     Arm Ergometer   Level 1   Minutes 17     Track   Laps 6   Minutes 17     Goals Met:  Using PLB without cueing & demonstrates good technique Exercise tolerated well No report of cardiac concerns or symptoms Strength training completed today  Goals Unmet:  Not Applicable  Comments: Service time is from 1030 to 1230   Dr. Rush Farmer is Medical Director for Pulmonary Rehab at [facility].

## 2016-04-28 ENCOUNTER — Encounter (HOSPITAL_COMMUNITY): Payer: Medicare HMO

## 2016-04-30 ENCOUNTER — Encounter (HOSPITAL_COMMUNITY): Payer: Medicare HMO

## 2016-04-30 DIAGNOSIS — E1165 Type 2 diabetes mellitus with hyperglycemia: Secondary | ICD-10-CM | POA: Diagnosis not present

## 2016-04-30 DIAGNOSIS — N184 Chronic kidney disease, stage 4 (severe): Secondary | ICD-10-CM | POA: Diagnosis not present

## 2016-04-30 DIAGNOSIS — M79671 Pain in right foot: Secondary | ICD-10-CM | POA: Diagnosis not present

## 2016-04-30 DIAGNOSIS — M10071 Idiopathic gout, right ankle and foot: Secondary | ICD-10-CM | POA: Diagnosis not present

## 2016-05-04 ENCOUNTER — Encounter: Payer: Medicare HMO | Attending: Internal Medicine | Admitting: Nutrition

## 2016-05-04 ENCOUNTER — Telehealth: Payer: Self-pay | Admitting: Nutrition

## 2016-05-04 ENCOUNTER — Encounter (HOSPITAL_COMMUNITY): Payer: Self-pay | Admitting: Nurse Practitioner

## 2016-05-04 VITALS — Ht 69.0 in | Wt 262.0 lb

## 2016-05-04 DIAGNOSIS — M1000 Idiopathic gout, unspecified site: Secondary | ICD-10-CM

## 2016-05-04 DIAGNOSIS — E119 Type 2 diabetes mellitus without complications: Secondary | ICD-10-CM | POA: Diagnosis not present

## 2016-05-04 DIAGNOSIS — Z713 Dietary counseling and surveillance: Secondary | ICD-10-CM | POA: Insufficient documentation

## 2016-05-04 DIAGNOSIS — E118 Type 2 diabetes mellitus with unspecified complications: Secondary | ICD-10-CM

## 2016-05-04 DIAGNOSIS — N289 Disorder of kidney and ureter, unspecified: Secondary | ICD-10-CM | POA: Diagnosis not present

## 2016-05-04 DIAGNOSIS — E669 Obesity, unspecified: Secondary | ICD-10-CM | POA: Diagnosis not present

## 2016-05-04 DIAGNOSIS — E1165 Type 2 diabetes mellitus with hyperglycemia: Secondary | ICD-10-CM

## 2016-05-04 DIAGNOSIS — I10 Essential (primary) hypertension: Secondary | ICD-10-CM | POA: Insufficient documentation

## 2016-05-04 DIAGNOSIS — Z794 Long term (current) use of insulin: Secondary | ICD-10-CM

## 2016-05-04 DIAGNOSIS — E782 Mixed hyperlipidemia: Secondary | ICD-10-CM | POA: Insufficient documentation

## 2016-05-04 DIAGNOSIS — IMO0002 Reserved for concepts with insufficient information to code with codable children: Secondary | ICD-10-CM

## 2016-05-04 NOTE — Progress Notes (Signed)
  Medical Nutrition Therapy:  Appt start time:0930 end time:  1000  Assessment:  Primary concerns today:  DM. Having  Gout problems in her feet. Has been put colchicine.Has bone spur in feet from Gout. She notes her A1C down to 7.2% from 8.1%. Her BS are much higher in the past month.. avg 240's. She notes she has been trying to limit her carbs and eat more protein, which may have contributed to her GOUT attack last week with possible dehydration and higher salt diet.  She takes 80 mgi BID of her fluid pills. Diet is higher in sodium and some processed foods.     Had been trying to be more active prior to the gout attack. Her husband does most of the cooking.   Diet needs more balanced carbs per meals and more lower carb vegetables and increase in water intake.    Received labs from Dr. Dagmar Hait office: 28/18  Uric Acid 12.8 H     BUN 29 H, Cret 1.9 H,    EGFR 26.2 H     Pt. Is on Metformin and 54 units of Levemir BID and 12 units of Humalog with meals.  Metformin is contraindicated with Creat  of 1.9 and low EGFR. BS readings for last month have been avg 240's.        Preferred Learning Style:   No preference indicated   Learning Readiness:   Ready  MEDICATIONS: see list    DIETARY INTAKE:  24-hr recall:  Hasn't been eating on schedule since sick with Gout.     Usual physical activity: walks or plays ball with grandchildren for 10 minutes 3 x day   Estimated energy needs: 1600 calories 180 g carbohydrates 120 g protein 44 g fat  Progress Towards Goal(s):  In progress.   Nutritional Diagnosis:  NI-5.10.2 Excessive mineral intake (specify): sodium As related to related fluid retention from congestive heart failure.  As evidenced by diet recall of frequent high sodium meals.  Nutritional Diagnosis:  [place]-2.1 Inpaired nutrition utilization As related to glucose metabolism.  As evidenced by Hgb A1c of 8.2%.    Intervention:  Nutrition counseling provided on My Plate, Carb counting and  a Low Purine Diet due to GOUT.   Goals Follow Low Purine Diet Cut out snacks Increase water intake Ask Dr. Dagmar Hait for referral to see a kidney doctor Stop taking Metformin until you see kidney doctor. Eat 2-3 carb choices per meal Avoid salty and processed foods. Follow sliding scale of your insulin May benefit from seeing an Endocrinologist for your DM.  Teaching Method Utilized:  Visual Auditory Hands on  Handouts given during visit include:  Low Sodium MNT  1500 calorie Meal Plan  Low sodium flavoring tips  Barriers to learning/adherence to lifestyle change: none  Demonstrated degree of understanding via:  Teach Back   Monitoring/Evaluation:  Dietary intake, exercise, and body weight in 1 month(s). Recommend to stop Metformin due to elevated Creatine level  > 1.3 and eGFR of 26.  Marland Kitchen Recommend referral to a Nephrologist due to eGFR of 26 and an Endocrinologist for her diabetes.Marland Kitchen

## 2016-05-04 NOTE — Telephone Encounter (Signed)
VM left on answering machine to return call.

## 2016-05-04 NOTE — Patient Instructions (Signed)
Goals Follow Low Purine Diet Cut out snacks Increase water intake Ask Dr. Dagmar Hait for referral to see a kidney doctor Stop taking Metformin until you see kidney doctor. Eat 2-3 carb choices per meal Avoid salty and processed foods. Follow sliding scale of your insulin May benefit from seeing an Endocrinologist for your DM.

## 2016-05-04 NOTE — Telephone Encounter (Signed)
Return call from pt. Advised her of her labs that were received from PCP from 04/30/16 labs.. Due to elevated Creatine of 1.9, recommended she stop taking Metformin immediately until she sees a kidney doctor. Due to eGFR of 26, recommended she contact her PCP to be referred to a Nephrologist based on elevated Creatine and low eGFR of 26. Advised that she may benefit from seeing an Endocrinologist to better manage her DM. She verbalized understanding.

## 2016-05-05 ENCOUNTER — Encounter (HOSPITAL_COMMUNITY)
Admission: RE | Admit: 2016-05-05 | Discharge: 2016-05-05 | Disposition: A | Discharge status: Home / Self Care | Payer: Medicare HMO | Source: Ambulatory Visit | Attending: Pulmonary Disease | Admitting: Pulmonary Disease

## 2016-05-05 ENCOUNTER — Telehealth (HOSPITAL_COMMUNITY): Payer: Self-pay | Admitting: *Deleted

## 2016-05-05 ENCOUNTER — Telehealth (HOSPITAL_COMMUNITY): Payer: Self-pay | Admitting: Internal Medicine

## 2016-05-05 DIAGNOSIS — J984 Other disorders of lung: Secondary | ICD-10-CM

## 2016-05-05 DIAGNOSIS — T462X5A Adverse effect of other antidysrhythmic drugs, initial encounter: Principal | ICD-10-CM

## 2016-05-05 NOTE — Telephone Encounter (Signed)
Pt cancelled due to acute gout.... KJ

## 2016-05-05 NOTE — Progress Notes (Signed)
Nutrition Note Spoke with pt over the phone. Pt requests nutrition information re: Low-purine Nutrition therapy due to gout flare-up. Low-Purine/Purine-Restricted Nutrition Therapy handout mailed to pt. Pt encouraged to call if she has questions re: diet before she returns to Pulmonary Rehab. Continue client-centered nutrition education by RD as part of interdisciplinary care.  Monitor and evaluate progress toward nutrition goal with team.  Derek Mound, M.Ed, RD, LDN, CDE 05/05/2016 2:13 PM

## 2016-05-07 ENCOUNTER — Encounter (HOSPITAL_COMMUNITY): Payer: Medicare HMO

## 2016-05-07 ENCOUNTER — Encounter (HOSPITAL_COMMUNITY): Payer: Self-pay

## 2016-05-07 DIAGNOSIS — T462X5A Adverse effect of other antidysrhythmic drugs, initial encounter: Principal | ICD-10-CM

## 2016-05-07 DIAGNOSIS — J984 Other disorders of lung: Secondary | ICD-10-CM

## 2016-05-07 NOTE — Progress Notes (Signed)
Pulmonary Individual Treatment Plan  Patient Details  Name: Kristine [name] MRN: [id number] Date of Birth: [birth year] Referring Provider:   April Manson Pulmonary Rehab Walk Test from 03/24/2016 in [place]  Referring Provider  Dr. Vaughan Browner      Initial Encounter Date:  Flowsheet Row Pulmonary Rehab Walk Test from 03/24/2016 in [place]  Date  03/24/16  Referring Provider  Dr. Vaughan Browner      Visit Diagnosis: Amiodarone pulmonary toxicity  Patient's Home Medications on Admission:   Current Outpatient Prescriptions:  .  ALPRAZolam (XANAX) 0.25 MG tablet, Take 0.125-0.25 mg by mouth 2 (two) times daily as needed for anxiety. , Disp: , Rfl: 0 .  amiodarone (PACERONE) 200 MG tablet, Take 1 pill on Mondays, Wednesdays, and Fridays only, Disp: 180 tablet, Rfl: 2 .  BD PEN NEEDLE NANO U/F 32G X 4 MM MISC, Inject 1 each as directed See admin instructions. Use pen needles with insulin pens daily, Disp: , Rfl: 11 .  clindamycin (CLEOCIN) 300 MG capsule, TAKE 2 CAPSULES BY MOUTH 1 HOUR PRIOR TO DENTAL APPOINTMENT, Disp: , Rfl: 0 .  colchicine 0.6 MG tablet, Take 0.6 mg by mouth daily., Disp: , Rfl:  .  esomeprazole (NEXIUM) 40 MG capsule, Take 40 mg by mouth daily as needed (for acid reflux). , Disp: , Rfl:  .  furosemide (LASIX) 80 MG tablet, TAKE 1 TABLET (80 MG TOTAL) BY MOUTH 2 (TWO) TIMES DAILY., Disp: 180 tablet, Rfl: 3 .  gabapentin (NEURONTIN) 100 MG capsule, Take 100 mg by mouth at bedtime. , Disp: , Rfl:  .  HUMALOG KWIKPEN 200 UNIT/ML SOPN, Inject 12 Units into the skin 3 (three) times daily with meals. , Disp: , Rfl: 6 .  insulin detemir (LEVEMIR) 100 UNIT/ML injection, Inject 54 Units into the skin 2 (two) times daily. , Disp: , Rfl:  .  KLOR-CON 10 10 MEQ tablet, Take 20 mEq by mouth daily., Disp: , Rfl: 11 .  levothyroxine (SYNTHROID, LEVOTHROID) 25 MCG tablet, Take 1 tablet by mouth daily., Disp: , Rfl:  .  losartan  (COZAAR) 100 MG tablet, TAKE 1 TABLET BY MOUTH EVERY DAY, Disp: 90 tablet, Rfl: 3 .  Melatonin 1 MG CAPS, Take by mouth at bedtime., Disp: , Rfl:  .  metFORMIN (GLUCOPHAGE) 500 MG tablet, 1000 mg in the AM and 542m at night, Disp: , Rfl:  .  metolazone (ZAROXOLYN) 2.5 MG tablet, Take 2.5 mg by mouth as needed (for shortness of breath). , Disp: , Rfl:  .  metoprolol succinate (TOPROL-XL) 50 MG 24 hr tablet, Take 1 tablet (50 mg total) by mouth 2 (two) times daily., Disp: 270 tablet, Rfl: 3 .  rivaroxaban (XARELTO) 20 MG TABS tablet, Take 1 tablet (20 mg total) by mouth daily., Disp: 30 tablet, Rfl: 4 .  Vitamin D, Ergocalciferol, (DRISDOL) 50000 UNITS CAPS, Take 50,000 Units by mouth every 7 (seven) days. Takes on Thursday, Disp: , Rfl:   Past Medical History: Past Medical History:  Diagnosis Date  . Anxiety   . Asthma   . CHF (congestive heart failure) ([place]   . Chronic bronchitis ([place]   . GERD (gastroesophageal reflux disease)   . High cholesterol   . History of hiatal hernia   . HTN (hypertension)   . Mild aortic sclerosis ([facility] [place]   . Morbid obesity ([facility]   . Osteoporosis   . Persistent atrial fibrillation ([place]   . Scoliosis   .  Spinal stenosis   . Type II diabetes mellitus ([place])   . Vitamin D deficiency     Tobacco Use: History  Smoking Status  . Never Smoker  Smokeless Tobacco  . Never Used    Labs: Recent Review Flowsheet Data    Labs for ITP Cardiac and Pulmonary Rehab Latest Ref Rng & Units 06/01/2015   Hemoglobin A1c 4.8 - 5.6 % 8.1(H)      Capillary Blood Glucose: Lab Results  Component Value Date   GLUCAP 141 (H) 04/14/2016   GLUCAP 86 04/07/2016   GLUCAP 73 04/07/2016   GLUCAP 226 (H) 03/31/2016   GLUCAP 187 (H) 06/02/2015       POCT Glucose    Row Name 04/02/16 1231 04/07/16 1212 04/14/16 1209 04/16/16 1227 04/23/16 1247     POCT Blood Glucose   Pre-Exercise 164 mg/dL 73 mg/dL 283 mg/dL 143 mg/dL  Gave her lemonaide to drink before exercise 179  mg/dL   Post-Exercise  -  - 141 mg/dL 152 mg/dL 156 mg/dL   Pre-Exercise #2  - 86 mg/dL  -  -  -      ADL UCSD:     Pulmonary Assessment Scores    Row Name 04/02/16 1653         ADL UCSD   ADL Phase Entry     SOB Score total 102        Pulmonary Function Assessment:     Pulmonary Function Assessment - 03/20/16 1046      Breath   Bilateral Breath Sounds Clear   Shortness of Breath Yes;Fear of Shortness of Breath;Limiting activity;Panic with Shortness of Breath      Exercise Target Goals:    Exercise Program Goal: Individual exercise prescription set with THRR, safety & activity barriers. Participant demonstrates ability to understand and report RPE using BORG scale, to self-measure pulse accurately, and to acknowledge the importance of the exercise prescription.  Exercise Prescription Goal: Starting with aerobic activity 30 plus minutes a day, 3 days per week for initial exercise prescription. Provide home exercise prescription and guidelines that participant acknowledges understanding prior to discharge.  Activity Barriers & Risk Stratification:     Activity Barriers & Cardiac Risk Stratification - 03/20/16 1046      Activity Barriers & Cardiac Risk Stratification   Activity Barriers Deconditioning;Shortness of Breath;Balance Concerns      6 Minute Walk:     6 Minute Walk    Row Name 03/24/16 1514         6 Minute Walk   Phase Initial     Distance 757 feet     Walk Time -  4 minutes 30 seconds     # of Rest Breaks 1  1 minute and 30 seconds     MPH 1.43     METS 2.07     RPE 14     Perceived Dyspnea  2     Symptoms Yes (comment)     Comments Needed wheelchair due to leg weakness and dizziness     Resting HR 62 bpm     Resting BP 144/82     Max Ex. HR 97 bpm     Max Ex. BP 148/84     2 Minute Post BP 120/86       Interval HR   Baseline HR 62     1 Minute HR 67     2 Minute HR 82     3 Minute HR 94  4 Minute HR 94     5 Minute HR 97      6 Minute HR 86     2 Minute Post HR 68     Interval Heart Rate? Yes       Interval Oxygen   Interval Oxygen? Yes     Baseline Oxygen Saturation % 95 %     Baseline Liters of Oxygen 0 L     1 Minute Oxygen Saturation % 96 %     1 Minute Liters of Oxygen 0 L     2 Minute Oxygen Saturation % 95 %     2 Minute Liters of Oxygen 0 L     3 Minute Oxygen Saturation % 95 %     3 Minute Liters of Oxygen 0 L     4 Minute Oxygen Saturation % 95 %     4 Minute Liters of Oxygen 0 L     5 Minute Oxygen Saturation % 95 %     5 Minute Liters of Oxygen 0 L     6 Minute Oxygen Saturation % 94 %     6 Minute Liters of Oxygen 0 L     2 Minute Post Oxygen Saturation % 97 %     2 Minute Post Liters of Oxygen 0 L        Initial Exercise Prescription:     Initial Exercise Prescription - 03/24/16 1500      Date of Initial Exercise RX and Referring Provider   Date 03/24/16   Referring Provider Dr. Vaughan Browner     NuStep   Level 1   Minutes 17   METs 1.5     Arm Ergometer   Level 1   Minutes 17     Track   Laps 5   Minutes 17     Prescription Details   Frequency (times per week) 2   Duration Progress to 45 minutes of aerobic exercise without signs/symptoms of physical distress     Intensity   THRR 40-80% of Max Heartrate 60-121   Ratings of Perceived Exertion 11-13   Perceived Dyspnea 0-4     Progression   Progression Continue progressive overload as per policy without signs/symptoms or physical distress.     Resistance Training   Training Prescription Yes   Weight orange band   Reps 10-12      Perform Capillary Blood Glucose checks as needed.  Exercise Prescription Changes:     Exercise Prescription Changes    Row Name 04/02/16 1200 04/07/16 1200 04/14/16 1200 04/16/16 1200 04/23/16 1245     Response to Exercise   Blood Pressure (Admit) 158/72 130/70 122/64 130/56 130/70   Blood Pressure (Exercise)  -  - 136/70 110/60 108/68   Blood Pressure (Exit) 148/80  - 120/62  118/60 110/70   Heart Rate (Admit) 67 bpm 54 bpm 55 bpm 55 bpm 56 bpm   Heart Rate (Exercise)  -  - 69 bpm 57 bpm 78 bpm   Heart Rate (Exit) 55 bpm  - 53 bpm 53 bpm 58 bpm   Oxygen Saturation (Admit) 93 % 94 % 93 % 95 % 97 %   Oxygen Saturation (Exercise)  -  - 90 % 97 % 95 %   Oxygen Saturation (Exit) 94 %  - 95 % 94 % 95 %   Rating of Perceived Exertion (Exercise)  -  - 13 13 13    Perceived Dyspnea (Exercise)  -  - 1 1  1   Comments had to leave early low cbg, treatment and consult with dietition  -  -  -   Duration Progress to 45 minutes of aerobic exercise without signs/symptoms of physical distress  - Progress to 45 minutes of aerobic exercise without signs/symptoms of physical distress Progress to 45 minutes of aerobic exercise without signs/symptoms of physical distress Progress to 45 minutes of aerobic exercise without signs/symptoms of physical distress   Intensity  -  - -  40-80% HRR THRR unchanged THRR unchanged     Progression   Progression  -  - Continue to progress workloads to maintain intensity without signs/symptoms of physical distress. Continue to progress workloads to maintain intensity without signs/symptoms of physical distress. Continue to progress workloads to maintain intensity without signs/symptoms of physical distress.     Resistance Training   Training Prescription Yes  - Yes Yes Yes   Weight orange  - orange orange bands orange bands   Reps 10-12  - 10-12  10 minutes of strength training 10-12  10 minutes of strenght training 10-12  10 minutes of strenght training     Interval Training   Interval Training  -  - No No No     Oxygen   Oxygen -  room air  -  -  -  -     NuStep   Level  -  - 1 1  -   Minutes  -  - 17 17  -   METs  -  - 1.3 1.5  -     Arm Ergometer   Level  -  - 1 1 1    Minutes  -  - 17 17 17      Track   Laps 2  - 2  - 6   Minutes 5  - 17  - 17      Exercise Comments:     Exercise Comments    Row Name 04/13/16 1651 05/04/16  1309         Exercise Comments Patient has attended three exercise sessions but has not exercised yet. She is having complications with blood sugar. Patient has only attended three full exercise sessions. Will cont. to monitor and progress when able.          Discharge Exercise Prescription (Final Exercise Prescription Changes):     Exercise Prescription Changes - 04/23/16 1245      Response to Exercise   Blood Pressure (Admit) 130/70   Blood Pressure (Exercise) 108/68   Blood Pressure (Exit) 110/70   Heart Rate (Admit) 56 bpm   Heart Rate (Exercise) 78 bpm   Heart Rate (Exit) 58 bpm   Oxygen Saturation (Admit) 97 %   Oxygen Saturation (Exercise) 95 %   Oxygen Saturation (Exit) 95 %   Rating of Perceived Exertion (Exercise) 13   Perceived Dyspnea (Exercise) 1   Duration Progress to 45 minutes of aerobic exercise without signs/symptoms of physical distress   Intensity THRR unchanged     Progression   Progression Continue to progress workloads to maintain intensity without signs/symptoms of physical distress.     Resistance Training   Training Prescription Yes   Weight orange bands   Reps 10-12  10 minutes of strenght training     Interval Training   Interval Training No     Arm Ergometer   Level 1   Minutes 17     Track   Laps 6   Minutes 17  Nutrition:  Target Goals: Understanding of nutrition guidelines, daily intake of sodium <1555m, cholesterol <2031m calories 30% from fat and 7% or less from saturated fats, daily to have 5 or more servings of fruits and vegetables.  Biometrics:     Pre Biometrics - 03/20/16 1232      Pre Biometrics   Grip Strength 32 kg       Nutrition Therapy Plan and Nutrition Goals:     Nutrition Therapy & Goals - 04/02/16 1219      Nutrition Therapy   Diet Carb Modified, Low Sodium      Personal Nutrition Goals   Personal Goal #1 1-2 lb wt loss/week to a wt loss goal of 6-24 lb at [facility] [place] and counsel regarding individualized specific dietary modifications aiming towards targeted core components such as weight, hypertension, lipid management, diabetes, heart failure and other comorbidities.   Expected Outcomes Short Term Goal: Understand basic principles of dietary content, such as calories, fat, sodium, cholesterol and nutrients.;Long Term Goal: Adherence to prescribed nutrition plan.      Nutrition Discharge: Rate Your Plate Scores:     Nutrition Assessments - 04/20/16 1357      Rate Your Plate Scores   Pre Score 45      Psychosocial: Target Goals: Acknowledge presence or absence of depression, maximize coping skills, provide positive support system. Participant is able to verbalize types and ability to use techniques and skills needed for reducing stress and depression.  Initial Review & Psychosocial Screening:     Initial Psych Review & Screening - 03/20/16 1106      Initial Review   Current issues with Current Abuse or Neglect to [place]     Barriers   Psychosocial barriers to participate in program Psychosocial barriers identified (see note)     Screening Interventions   Interventions Encouraged to exercise      Quality of Life Scores:     Quality of Life - 04/02/16 1654      Quality of Life Scores   Health/Function Pre 16.4 %   Socioeconomic Pre 19.75 %   Psych/Spiritual Pre 23.43 %   Family Pre 18 %   GLOBAL Pre 18.82 %      PHQ-9: Recent Review Flowsheet Data    Dep[facility] 05/04/2016 03/20/2016 09/18/2015   Decreased Interest 0 0 0   Down, Depressed, Hopeless 0 0 0   PHQ - 2 Score 0 0 0      Psychosocial Evaluation and Intervention:     Psychosocial Evaluation - 03/20/16 1120      Psychosocial Evaluation & Interventions   Interventions (P)  Encouraged to exercise with the program and follow exercise prescription       Psychosocial Re-Evaluation:     Psychosocial Re-Evaluation    [place] 04/14/16 0742 05/04/16 1610           Psychosocial Re-Evaluation   Interventions Encouraged to attend Pulmonary Rehabilitation for the exercise Encouraged to attend Pulmonary Rehabilitation for the exercise      Comments see comments section on ITP for psychosocial barriers see comments section on ITP for psychosocial barriers        Education: Education Goals: Education classes will be provided on a weekly basis, covering required topics. Participant will state understanding/return demonstration of topics presented.  Learning Barriers/Preferences:  Learning Barriers/Preferences - 03/20/16 1046      Learning Barriers/Preferences   Learning Barriers None   Learning Preferences Group Instruction;Written Material;Skilled Demonstration      Education Topics: Risk Factor Reduction:  -Group instruction that is supported by a PowerPoint presentation. Instructor discusses the definition of a risk factor, different risk factors for pulmonary disease, and how the heart and lungs work together.     Nutrition for Pulmonary Patient:  -Group instruction provided by PowerPoint slides, verbal discussion, and written materials to support subject matter. The instructor gives an explanation and review of healthy diet recommendations, which includes a discussion on weight management, recommendations for fruit and vegetable consumption, as well as protein, fluid, caffeine, fiber, sodium, sugar, and alcohol. Tips for eating when patients are short of breath are discussed. Flowsheet Row PULMONARY REHAB OTHER RESPIRATORY from 04/23/2016 in [place]  Date  04/02/16  Educator  edna  Instruction Review Code  2- meets goals/outcomes      Pursed Lip Breathing:  -Group instruction that is supported by demonstration and informational handouts. Instructor discusses the benefits of pursed lip  and diaphragmatic breathing and detailed demonstration on how to preform both.     Oxygen Safety:  -Group instruction provided by PowerPoint, verbal discussion, and written material to support subject matter. There is an overview of "What is Oxygen" and "Why do we need it".  Instructor also reviews how to create a safe environment for oxygen use, the importance of using oxygen as prescribed, and the risks of noncompliance. There is a brief discussion on traveling with oxygen and resources the patient may utilize.   Oxygen Equipment:  -Group instruction provided by [facility] Staff utilizing handouts, written materials, and equipment demonstrations.   Signs and Symptoms:  -Group instruction provided by written material and verbal discussion to support subject matter. Warning signs and symptoms of infection, stroke, and heart attack are reviewed and when to call the physician/911 reinforced. Tips for preventing the spread of infection discussed. Flowsheet Row PULMONARY REHAB OTHER RESPIRATORY from 04/23/2016 in [place]  Date  04/23/16  Educator  RN  Instruction Review Code  2- meets goals/outcomes      Advanced Directives:  -Group instruction provided by verbal instruction and written material to support subject matter. Instructor reviews Advanced Directive laws and proper instruction for filling out document.   Pulmonary Video:  -Group video education that reviews the importance of medication and oxygen compliance, exercise, good nutrition, pulmonary hygiene, and pursed lip and diaphragmatic breathing for the pulmonary patient.   Exercise for the Pulmonary Patient:  -Group instruction that is supported by a PowerPoint presentation. Instructor discusses benefits of exercise, core components of exercise, frequency, duration, and intensity of an exercise routine, importance of utilizing pulse oximetry during exercise, safety while exercising, and options of  places to exercise outside of rehab.     Pulmonary Medications:  -Verbally interactive group education provided by instructor with focus on inhaled medications and proper administration. Flowsheet Row PULMONARY REHAB OTHER RESPIRATORY from 04/23/2016 in [place]  Date  04/16/16  Educator  Pharm D  Instruction Review Code  2- meets goals/outcomes      Anatomy and Physiology of the Respiratory System and Intimacy:  -Group instruction provided by PowerPoint, verbal discussion, and written material to support subject matter. Instructor reviews respiratory cycle and anatomical components of the respiratory system and their functions. Instructor also reviews differences in obstructive and  restrictive respiratory diseases with examples of each. Intimacy, Sex, and Sexuality differences are reviewed with a discussion on how relationships can change when diagnosed with pulmonary disease. Common sexual concerns are reviewed.   Knowledge Questionnaire Score:     Knowledge Questionnaire Score - 04/02/16 1653      Knowledge Questionnaire Score   Pre Score 9/13      Core Components/Risk Factors/Patient Goals at Admission:     Personal Goals and Risk Factors at Admission - 03/20/16 1047      Core Components/Risk Factors/Patient Goals on Admission    Weight Management Yes;Obesity   Intervention Obesity: Provide education and appropriate resources to help participant work on and attain dietary goals.;Weight Management/Obesity: Establish reasonable short term and long term weight goals.   Expected Outcomes Weight Loss: Understanding of general recommendations for a balanced deficit meal plan, which promotes 1-2 lb weight loss per week and includes a negative energy balance of [phone] kcal/d   Increase Strength and Stamina Yes   Intervention Provide advice, education, support and counseling about physical activity/exercise needs.;Develop an individualized exercise  prescription for aerobic and resistive training based on initial evaluation findings, risk stratification, comorbidities and participant's personal goals.   Expected Outcomes Achievement of increased cardiorespiratory fitness and enhanced flexibility, muscular endurance and strength shown through measurements of functional capacity and personal statement of participant.   Improve shortness of breath with ADL's Yes   Intervention Provide education, individualized exercise plan and daily activity instruction to help decrease symptoms of SOB with activities of daily living.   Expected Outcomes Short Term: Achieves a reduction of symptoms when performing activities of daily living.   Develop more efficient breathing techniques such as purse lipped breathing and diaphragmatic breathing; and practicing self-pacing with activity Yes   Intervention Provide education, demonstration and support about specific breathing techniuqes utilized for more efficient breathing. Include techniques such as pursed lipped breathing, diaphragmatic breathing and self-pacing activity.   Expected Outcomes Short Term: Participant will be able to demonstrate and use breathing techniques as needed throughout daily activities.   Heart Failure Yes   Intervention Provide a combined exercise and nutrition program that is supplemented with education, support and counseling about heart failure. Directed toward relieving symptoms such as shortness of breath, decreased exercise tolerance, and extremity edema.      Core Components/Risk Factors/Patient Goals Review:      Goals and Risk Factor Review    Row Name 04/14/16 0741 05/04/16 1610           Core Components/Risk Factors/Patient Goals Review   Personal Goals Review Weight Management/Obesity;Sedentary;Develop more efficient breathing techniques such as purse lipped breathing and diaphragmatic breathing and practicing self-pacing with activity.;Increase Strength and  Stamina;Improve shortness of breath with ADL's;Heart Failure;Diabetes Weight Management/Obesity;Sedentary;Develop more efficient breathing techniques such as purse lipped breathing and diaphragmatic breathing and practicing self-pacing with activity.;Increase Strength and Stamina;Improve shortness of breath with ADL's;Heart Failure;Diabetes      Review see comments section on ITP see comments section on ITP      Expected Outcomes see admission expected outcomes see admission expected outcomes         Core Components/Risk Factors/Patient Goals at Discharge (Final Review):      Goals and Risk Factor Review - 05/04/16 1610      Core Components/Risk Factors/Patient Goals Review   Personal Goals Review Weight Management/Obesity;Sedentary;Develop more efficient breathing techniques such as purse lipped breathing and diaphragmatic breathing and practicing self-pacing with activity.;Increase Strength and Stamina;Improve shortness of breath with  ADL's;Heart Failure;Diabetes   Review see comments section on ITP   Expected Outcomes see admission expected outcomes      ITP Comments:   Comments: ITP REVIEW Pt is not making expected progress toward pulmonary rehab goals after completing 6 sessions. She has difficulty beginning the exercise program related to the regulation of her blood sugar and compliance with eating prior to exercise. She met with the RD multiple times. She is not unable to attend her exercise sessions related to a new onset of gout. It is also documented that her renal function is worsening and she has been told to stop her metformin and other medication adjustments by her PCP. Will follow up with patient this pm via telephone call.

## 2016-05-08 ENCOUNTER — Telehealth: Payer: Self-pay

## 2016-05-08 NOTE — Telephone Encounter (Signed)
Called Murel's home and left Voicemail.  Called cellphone and spoke to her husband wanting to check up on Tinna as she hasn't been to weekly class in a few weeks.  He said she is "starting to come around" and will hopefully be at class next week.

## 2016-05-12 ENCOUNTER — Telehealth (HOSPITAL_COMMUNITY): Payer: Self-pay

## 2016-05-12 ENCOUNTER — Encounter (HOSPITAL_COMMUNITY): Payer: Medicare HMO

## 2016-05-13 ENCOUNTER — Other Ambulatory Visit: Payer: Self-pay | Admitting: Internal Medicine

## 2016-05-13 DIAGNOSIS — N184 Chronic kidney disease, stage 4 (severe): Secondary | ICD-10-CM

## 2016-05-14 ENCOUNTER — Ambulatory Visit
Admission: RE | Admit: 2016-05-14 | Discharge: 2016-05-14 | Disposition: A | Discharge status: Home / Self Care | Payer: Medicare HMO | Source: Ambulatory Visit | Attending: Internal Medicine | Admitting: Internal Medicine

## 2016-05-14 ENCOUNTER — Encounter (HOSPITAL_COMMUNITY): Payer: Medicare HMO

## 2016-05-14 DIAGNOSIS — M5136 Other intervertebral disc degeneration, lumbar region: Secondary | ICD-10-CM | POA: Diagnosis not present

## 2016-05-14 DIAGNOSIS — N184 Chronic kidney disease, stage 4 (severe): Secondary | ICD-10-CM | POA: Diagnosis not present

## 2016-05-14 DIAGNOSIS — M2550 Pain in unspecified joint: Secondary | ICD-10-CM | POA: Diagnosis not present

## 2016-05-14 DIAGNOSIS — Z6841 Body Mass Index (BMI) 40.0 and over, adult: Secondary | ICD-10-CM | POA: Diagnosis not present

## 2016-05-14 DIAGNOSIS — R768 Other specified abnormal immunological findings in serum: Secondary | ICD-10-CM | POA: Diagnosis not present

## 2016-05-14 DIAGNOSIS — M150 Primary generalized (osteo)arthritis: Secondary | ICD-10-CM | POA: Diagnosis not present

## 2016-05-14 DIAGNOSIS — M10071 Idiopathic gout, right ankle and foot: Secondary | ICD-10-CM | POA: Diagnosis not present

## 2016-05-19 ENCOUNTER — Encounter (HOSPITAL_COMMUNITY): Admission: RE | Admit: 2016-05-19 | Payer: Medicare HMO | Source: Ambulatory Visit

## 2016-05-20 ENCOUNTER — Other Ambulatory Visit (HOSPITAL_COMMUNITY): Payer: Self-pay | Admitting: Nurse Practitioner

## 2016-05-20 DIAGNOSIS — R69 Illness, unspecified: Secondary | ICD-10-CM | POA: Diagnosis not present

## 2016-05-21 ENCOUNTER — Encounter (HOSPITAL_COMMUNITY): Payer: Medicare HMO

## 2016-05-21 DIAGNOSIS — N184 Chronic kidney disease, stage 4 (severe): Secondary | ICD-10-CM | POA: Diagnosis not present

## 2016-05-21 DIAGNOSIS — I4891 Unspecified atrial fibrillation: Secondary | ICD-10-CM | POA: Diagnosis not present

## 2016-05-21 DIAGNOSIS — E1165 Type 2 diabetes mellitus with hyperglycemia: Secondary | ICD-10-CM | POA: Diagnosis not present

## 2016-05-21 DIAGNOSIS — Z6841 Body Mass Index (BMI) 40.0 and over, adult: Secondary | ICD-10-CM | POA: Diagnosis not present

## 2016-05-21 DIAGNOSIS — I10 Essential (primary) hypertension: Secondary | ICD-10-CM | POA: Diagnosis not present

## 2016-05-25 DIAGNOSIS — I129 Hypertensive chronic kidney disease with stage 1 through stage 4 chronic kidney disease, or unspecified chronic kidney disease: Secondary | ICD-10-CM | POA: Diagnosis not present

## 2016-05-25 DIAGNOSIS — N184 Chronic kidney disease, stage 4 (severe): Secondary | ICD-10-CM | POA: Diagnosis not present

## 2016-05-26 ENCOUNTER — Telehealth (HOSPITAL_COMMUNITY): Payer: Self-pay

## 2016-05-26 ENCOUNTER — Encounter (HOSPITAL_COMMUNITY): Payer: Medicare HMO

## 2016-05-26 DIAGNOSIS — G909 Disorder of the autonomic nervous system, unspecified: Secondary | ICD-10-CM | POA: Diagnosis not present

## 2016-05-26 DIAGNOSIS — I110 Hypertensive heart disease with heart failure: Secondary | ICD-10-CM | POA: Diagnosis not present

## 2016-05-26 DIAGNOSIS — I509 Heart failure, unspecified: Secondary | ICD-10-CM | POA: Diagnosis not present

## 2016-05-26 DIAGNOSIS — M059 Rheumatoid arthritis with rheumatoid factor, unspecified: Secondary | ICD-10-CM | POA: Diagnosis not present

## 2016-05-26 DIAGNOSIS — D692 Other nonthrombocytopenic purpura: Secondary | ICD-10-CM | POA: Diagnosis not present

## 2016-05-26 DIAGNOSIS — Z794 Long term (current) use of insulin: Secondary | ICD-10-CM | POA: Diagnosis not present

## 2016-05-26 DIAGNOSIS — I4891 Unspecified atrial fibrillation: Secondary | ICD-10-CM | POA: Diagnosis not present

## 2016-05-26 DIAGNOSIS — E1140 Type 2 diabetes mellitus with diabetic neuropathy, unspecified: Secondary | ICD-10-CM | POA: Diagnosis not present

## 2016-05-26 DIAGNOSIS — R69 Illness, unspecified: Secondary | ICD-10-CM | POA: Diagnosis not present

## 2016-05-26 DIAGNOSIS — Z0000 Encounter for general adult medical examination without abnormal findings: Secondary | ICD-10-CM | POA: Diagnosis not present

## 2016-05-26 DIAGNOSIS — Z7901 Long term (current) use of anticoagulants: Secondary | ICD-10-CM | POA: Diagnosis not present

## 2016-05-26 DIAGNOSIS — M13142 Monoarthritis, not elsewhere classified, left hand: Secondary | ICD-10-CM | POA: Diagnosis not present

## 2016-05-28 ENCOUNTER — Encounter (HOSPITAL_COMMUNITY): Admission: RE | Admit: 2016-05-28 | Payer: Medicare HMO | Source: Ambulatory Visit

## 2016-05-29 DIAGNOSIS — R600 Localized edema: Secondary | ICD-10-CM | POA: Diagnosis not present

## 2016-05-29 DIAGNOSIS — Z6841 Body Mass Index (BMI) 40.0 and over, adult: Secondary | ICD-10-CM | POA: Diagnosis not present

## 2016-05-29 DIAGNOSIS — M10071 Idiopathic gout, right ankle and foot: Secondary | ICD-10-CM | POA: Diagnosis not present

## 2016-05-29 DIAGNOSIS — E669 Obesity, unspecified: Secondary | ICD-10-CM | POA: Diagnosis not present

## 2016-05-29 DIAGNOSIS — I509 Heart failure, unspecified: Secondary | ICD-10-CM | POA: Diagnosis not present

## 2016-05-29 DIAGNOSIS — R635 Abnormal weight gain: Secondary | ICD-10-CM | POA: Diagnosis not present

## 2016-05-29 DIAGNOSIS — N184 Chronic kidney disease, stage 4 (severe): Secondary | ICD-10-CM | POA: Diagnosis not present

## 2016-05-29 DIAGNOSIS — E1165 Type 2 diabetes mellitus with hyperglycemia: Secondary | ICD-10-CM | POA: Diagnosis not present

## 2016-06-01 ENCOUNTER — Ambulatory Visit: Payer: Medicare HMO | Admitting: Nutrition

## 2016-06-02 ENCOUNTER — Encounter (HOSPITAL_COMMUNITY): Payer: Medicare HMO

## 2016-06-04 ENCOUNTER — Encounter (HOSPITAL_COMMUNITY): Payer: Medicare HMO

## 2016-06-09 ENCOUNTER — Encounter (HOSPITAL_COMMUNITY): Payer: Medicare HMO

## 2016-06-10 ENCOUNTER — Ambulatory Visit: Payer: Medicare HMO | Admitting: Nutrition

## 2016-06-11 ENCOUNTER — Encounter (HOSPITAL_COMMUNITY): Payer: Medicare HMO

## 2016-06-11 DIAGNOSIS — M150 Primary generalized (osteo)arthritis: Secondary | ICD-10-CM | POA: Diagnosis not present

## 2016-06-11 DIAGNOSIS — Z79899 Other long term (current) drug therapy: Secondary | ICD-10-CM | POA: Diagnosis not present

## 2016-06-11 DIAGNOSIS — Z6841 Body Mass Index (BMI) 40.0 and over, adult: Secondary | ICD-10-CM | POA: Diagnosis not present

## 2016-06-11 DIAGNOSIS — M5136 Other intervertebral disc degeneration, lumbar region: Secondary | ICD-10-CM | POA: Diagnosis not present

## 2016-06-11 DIAGNOSIS — R768 Other specified abnormal immunological findings in serum: Secondary | ICD-10-CM | POA: Diagnosis not present

## 2016-06-11 DIAGNOSIS — M10071 Idiopathic gout, right ankle and foot: Secondary | ICD-10-CM | POA: Diagnosis not present

## 2016-06-16 ENCOUNTER — Encounter (HOSPITAL_COMMUNITY): Payer: Medicare HMO

## 2016-06-16 DIAGNOSIS — R69 Illness, unspecified: Secondary | ICD-10-CM | POA: Diagnosis not present

## 2016-06-18 ENCOUNTER — Encounter (HOSPITAL_COMMUNITY): Payer: Medicare HMO

## 2016-06-18 DIAGNOSIS — N184 Chronic kidney disease, stage 4 (severe): Secondary | ICD-10-CM | POA: Diagnosis not present

## 2016-06-18 DIAGNOSIS — M10071 Idiopathic gout, right ankle and foot: Secondary | ICD-10-CM | POA: Diagnosis not present

## 2016-06-18 DIAGNOSIS — I10 Essential (primary) hypertension: Secondary | ICD-10-CM | POA: Diagnosis not present

## 2016-06-18 DIAGNOSIS — M79671 Pain in right foot: Secondary | ICD-10-CM | POA: Diagnosis not present

## 2016-06-18 DIAGNOSIS — M76811 Anterior tibial syndrome, right leg: Secondary | ICD-10-CM | POA: Diagnosis not present

## 2016-06-18 DIAGNOSIS — Z6841 Body Mass Index (BMI) 40.0 and over, adult: Secondary | ICD-10-CM | POA: Diagnosis not present

## 2016-06-18 DIAGNOSIS — E1165 Type 2 diabetes mellitus with hyperglycemia: Secondary | ICD-10-CM | POA: Diagnosis not present

## 2016-06-22 ENCOUNTER — Telehealth (HOSPITAL_COMMUNITY): Payer: Self-pay | Admitting: *Deleted

## 2016-06-22 NOTE — Telephone Encounter (Signed)
Patient called in stating she feels her weight is up and she is noticing more shortness of breath. The patient does report having gout with a 3 week prednisone taper plus another gout medication. She feels to be in normal rhythm. She has had medication adjustments due to increased renal functions. Discussed with Roderic Palau NP she felt more appropriate with patient following up with Dr. Dagmar Hait who prescribed prednisone and has been following her medication adjustments with renal dosing. Left message informing patient of these recommendations to call her PCP to be followed up with for medication titration since in normal rhythm.

## 2016-06-23 ENCOUNTER — Encounter (HOSPITAL_COMMUNITY): Payer: Medicare HMO

## 2016-06-25 ENCOUNTER — Encounter (HOSPITAL_COMMUNITY): Payer: Medicare HMO

## 2016-06-30 ENCOUNTER — Encounter (HOSPITAL_COMMUNITY): Payer: Medicare HMO

## 2016-07-02 ENCOUNTER — Encounter (HOSPITAL_COMMUNITY): Payer: Medicare HMO

## 2016-07-03 DIAGNOSIS — M1A9XX0 Chronic gout, unspecified, without tophus (tophi): Secondary | ICD-10-CM | POA: Diagnosis not present

## 2016-07-03 DIAGNOSIS — E1165 Type 2 diabetes mellitus with hyperglycemia: Secondary | ICD-10-CM | POA: Diagnosis not present

## 2016-07-03 DIAGNOSIS — E1142 Type 2 diabetes mellitus with diabetic polyneuropathy: Secondary | ICD-10-CM | POA: Diagnosis not present

## 2016-07-05 DIAGNOSIS — R69 Illness, unspecified: Secondary | ICD-10-CM | POA: Diagnosis not present

## 2016-07-07 ENCOUNTER — Encounter (HOSPITAL_COMMUNITY): Payer: Medicare HMO

## 2016-07-07 DIAGNOSIS — M76811 Anterior tibial syndrome, right leg: Secondary | ICD-10-CM | POA: Diagnosis not present

## 2016-07-07 DIAGNOSIS — M79671 Pain in right foot: Secondary | ICD-10-CM | POA: Diagnosis not present

## 2016-07-09 ENCOUNTER — Encounter (HOSPITAL_COMMUNITY): Payer: Medicare HMO

## 2016-07-20 DIAGNOSIS — R69 Illness, unspecified: Secondary | ICD-10-CM | POA: Diagnosis not present

## 2016-07-27 ENCOUNTER — Ambulatory Visit: Payer: Medicare HMO

## 2016-07-29 ENCOUNTER — Ambulatory Visit (INDEPENDENT_AMBULATORY_CARE_PROVIDER_SITE_OTHER): Payer: Medicare HMO | Admitting: Neurology

## 2016-07-29 ENCOUNTER — Encounter: Payer: Self-pay | Admitting: Neurology

## 2016-07-29 VITALS — BP 155/79 | HR 76 | Ht 69.0 in | Wt 277.2 lb

## 2016-07-29 DIAGNOSIS — E538 Deficiency of other specified B group vitamins: Secondary | ICD-10-CM | POA: Diagnosis not present

## 2016-07-29 DIAGNOSIS — G629 Polyneuropathy, unspecified: Secondary | ICD-10-CM | POA: Diagnosis not present

## 2016-07-29 DIAGNOSIS — G6289 Other specified polyneuropathies: Secondary | ICD-10-CM | POA: Diagnosis not present

## 2016-07-29 DIAGNOSIS — R799 Abnormal finding of blood chemistry, unspecified: Secondary | ICD-10-CM | POA: Diagnosis not present

## 2016-07-29 DIAGNOSIS — G4719 Other hypersomnia: Secondary | ICD-10-CM

## 2016-07-29 DIAGNOSIS — Z794 Long term (current) use of insulin: Secondary | ICD-10-CM

## 2016-07-29 DIAGNOSIS — E1142 Type 2 diabetes mellitus with diabetic polyneuropathy: Secondary | ICD-10-CM | POA: Diagnosis not present

## 2016-07-29 DIAGNOSIS — I4819 Other persistent atrial fibrillation: Secondary | ICD-10-CM

## 2016-07-29 DIAGNOSIS — I481 Persistent atrial fibrillation: Secondary | ICD-10-CM | POA: Diagnosis not present

## 2016-07-29 DIAGNOSIS — M48061 Spinal stenosis, lumbar region without neurogenic claudication: Secondary | ICD-10-CM

## 2016-07-29 DIAGNOSIS — E559 Vitamin D deficiency, unspecified: Secondary | ICD-10-CM | POA: Diagnosis not present

## 2016-07-29 MED ORDER — ROPINIROLE HCL 0.5 MG PO TABS: 2.0000 mg | ORAL_TABLET | Freq: Every day | ORAL | 11 refills | Status: DC

## 2016-07-29 MED ORDER — LIDOCAINE 0.5 % EX GEL: 4.0000 g | Freq: Three times a day (TID) | CUTANEOUS | 6 refills | Status: DC

## 2016-07-29 NOTE — Progress Notes (Signed)
PATIENT: Kristine [name] DOB: 1945-04-16  Chief Complaint  Patient presents with  . Peripheral Neuropathy    She is here with her husband, [name].  Reports numbness, tingling, burning and pain in her bilateral legs, below her knees.  The loss of sensation is causing her gait difficulty and has resulted in falls.  She has also noticed the symptoms starting in her fingers on both hands. She is taking gabapentin 100mg  at bedtime but her cardiologist will not allow her to increase the dosage due to her a-fib.  . Orthopedic Surgery    Wylene Simmer, MD - referring MD  . PCP    Prince Solian, MD  . Cardiology    Nahser, Wonda Cheng, MD     HISTORICAL  Kristine [name] [age] years old right-handed female accompanied by her husband, seen in refer by Dr. Wylene Simmer, for evaluation of bilateral feet paresthesia, lower extremity swelling, her primary care physician is Dr. Prince Solian, initial evaluation was on Jul 29 2016.  She had a history of hypertension, diabetes since 2002, insulin dependent since 2011, lumbar stenosis, gout, she has a history of atrial fibrillation, required multiple cardial conversion in the past, could not tolerate medicine was potential QT prolongation side effect, such as higher dose of gabapentin. She is on chronic anticoagulation Xarelto, chronic renal disease, she is retired Marine scientist.  She noticed bilateral feet paresthesia since 2014, starting at the plantar surface distal toes, gradually getting up, now feel like a stocking to distal leg level, since January 2018, she also noticed bilateral fingertips paresthesia,  She also complains of neuropathic pain, especially after bearing weight, gradually getting worse, especially since her gout flareup in February 2018, she had significant bilateral toe joints pain, was treated with steroid, with poorly controlled diabetes during that period of time.  For a while, she was taking gabapentin up to 600 mg every night, which  has helped her symptoms some, but due to her cardiac arrhythmia, risk for prolonged QT interval, gabapentin was decreased to 100 mg every night, she complains of significant nighttime leg discomfort, urge to move, has to get up 2 AM last night take a shower, she has been treated with amiodarone 200 mg since 2017.  She also complains of low back pain, was diagnosed with lumbar stenosis in the past, now denied radiating pain, has urinary urgency, denies incontinence.  She has obesity, snoring sometimes, significant daytime sleepiness, fatigue, difficulty falling to sound nighttime sleep. Shortness of breath with minimal exertion  I reviewed the laboratory in February 2018, glucose was elevated 184, creatinine was 1.9, GFR was 26.2, CBC showed hemoglobin of 14 point 1, mild elevated glucose elevated TSH 6.5 in November 2017.    REVIEW OF SYSTEMS: Full 14 system review of systems performed and notable only for weight gain, fatigue, palpitation, blurred vision, easy bruising, easy bleeding, feeling cold, numbness, insomnia, not enough sleep  ALLERGIES: Allergies  Allergen Reactions  . Albuterol Other (See Comments)    REACTION: Tachycardia- AFib  . Epinephrine Other (See Comments)    Increases heart rate per patient.   . Penicillins Other (See Comments)    REACTION: flushing \\T \ hot  . Bee Venom Other (See Comments)    "makes me nervous"  . Ivp Dye [Iodinated Diagnostic Agents] Other (See Comments)    flushing    HOME MEDICATIONS: Current Outpatient Prescriptions  Medication Sig Dispense Refill  . ALPRAZolam (XANAX) 0.25 MG tablet Take 0.125-0.25 mg by mouth 2 (two)  times daily as needed for anxiety.   0  . amiodarone (PACERONE) 200 MG tablet Take 1 pill on Mondays, Wednesdays, and Fridays only 180 tablet 2  . BD PEN NEEDLE NANO U/F 32G X 4 MM MISC Inject 1 each as directed See admin instructions. Use pen needles with insulin pens daily  11  . clindamycin (CLEOCIN) 300 MG capsule TAKE 2  CAPSULES BY MOUTH 1 HOUR PRIOR TO DENTAL APPOINTMENT  0  . colchicine 0.6 MG tablet Take 0.6 mg by mouth daily.    Marland Kitchen esomeprazole (NEXIUM) 40 MG capsule Take 40 mg by mouth daily as needed (for acid reflux).     . furosemide (LASIX) 80 MG tablet TAKE 1 TABLET (80 MG TOTAL) BY MOUTH 2 (TWO) TIMES DAILY. 180 tablet 3  . gabapentin (NEURONTIN) 100 MG capsule Take 100 mg by mouth at bedtime.     Marland Kitchen HUMALOG KWIKPEN 200 UNIT/ML SOPN Inject 12 Units into the skin 3 (three) times daily with meals.   6  . insulin detemir (LEVEMIR) 100 UNIT/ML injection Inject 54 Units into the skin 2 (two) times daily.     Marland Kitchen KLOR-CON 10 10 MEQ tablet Take 20 mEq by mouth daily.  11  . levothyroxine (SYNTHROID, LEVOTHROID) 25 MCG tablet Take 1 tablet by mouth daily.    Marland Kitchen losartan (COZAAR) 100 MG tablet TAKE 1 TABLET BY MOUTH EVERY DAY 90 tablet 3  . Melatonin 1 MG CAPS Take by mouth at bedtime.    . metFORMIN (GLUCOPHAGE) 500 MG tablet 1000 mg in the AM and 500mg  at night    . metoprolol succinate (TOPROL-XL) 50 MG 24 hr tablet Take 1 tablet (50 mg total) by mouth 2 (two) times daily. 270 tablet 3  . metoprolol succinate (TOPROL-XL) 50 MG 24 hr tablet Take 1 tablet (50 mg total) by mouth 2 (two) times daily. 60 tablet 6  . Vitamin D, Ergocalciferol, (DRISDOL) 50000 UNITS CAPS Take 50,000 Units by mouth every 7 (seven) days. Takes on Thursday    . XARELTO 15 MG TABS tablet Take 15 mg by mouth daily.     No current facility-administered medications for this visit.     PAST MEDICAL HISTORY: Past Medical History:  Diagnosis Date  . Anxiety   . Asthma   . Atrial fibrillation ([place])   . CHF (congestive heart failure) ([place])   . Chronic bronchitis ([place])   . GERD (gastroesophageal reflux disease)   . High cholesterol   . History of hiatal hernia   . HTN (hypertension)   . Mild aortic sclerosis ([place])   . Morbid obesity ([place])   . Osteoporosis   . Persistent atrial fibrillation ([place])   . Scoliosis   . Spinal stenosis     . Type II diabetes mellitus ([place])   . Vitamin D deficiency     PAST SURGICAL HISTORY: Past Surgical History:  Procedure Laterality Date  . BLADDER SUSPENSION  1980s  . CARDIAC CATHETERIZATION  11/16/09   SMOOTH AND NORMAL  . CARDIOVERSION N/A 01/07/2015   Procedure: CARDIOVERSION;  Surgeon: Skeet Latch, MD;  [facility] ENDOSCOPY;  Service: Cardiovascular;  Laterality: N/A;  . CARDIOVERSION N/A 03/11/2015   Procedure: CARDIOVERSION;  Surgeon: Skeet Latch, MD;  Location: [place];  Service: Cardiovascular;  Laterality: N/A;  . CARPAL TUNNEL RELEASE Left 1970s  . CATARACT EXTRACTION W/ INTRAOCULAR LENS  IMPLANT, BILATERAL Bilateral ~ 2010  . COLONOSCOPY  08/14/08  . FINGER FRACTURE SURGERY Right 1970s   "ring finger"  .  FRACTURE SURGERY    . LAPAROSCOPIC CHOLECYSTECTOMY  1980s  . TUBAL LIGATION    . VAGINAL HYSTERECTOMY  1980    FAMILY HISTORY: Family History  Problem Relation Age of Onset  . Stroke Father   . Hypertension Father   . Atrial fibrillation Father     HAD MURMUR  . Heart failure Mother   . Congestive Heart Failure Mother   . Hypertension Mother   . Hypertension Brother   . Hypertension Sister   . Hypertension Son   . CAD Sister     EARLY  . CAD Brother     EARLY    SOCIAL HISTORY:  Social History   Social History  . Marital status: Married    Spouse name: N/A  . Number of children: 4  . Years of education: 14   Occupational History  . Not on file.   Social History Main Topics  . Smoking status: Never Smoker  . Smokeless tobacco: Never Used  . Alcohol use No  . Drug use: No  . Sexual activity: Yes   Other Topics Concern  . Not on file   Social History Narrative   Pt lives in [place] with spouse.   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   Right-handed.   1 cup caffeine per day.     PHYSICAL EXAM   Vitals:   07/29/16  0739  BP: (!) 155/79  Pulse: 76  Weight: 277 lb 4 oz (125.8 kg)  Height: 5\' 9"  (1.753 m)    Not recorded      Body mass index is 40.94 kg/m.  PHYSICAL EXAMNIATION:  Gen: NAD, conversant, well nourised, obese, well groomed                     Cardiovascular: Regular rate rhythm, no peripheral edema, warm, nontender. Eyes: Conjunctivae clear without exudates or hemorrhage Neck: Supple, no carotid bruits. Pulmonary: Clear to auscultation bilaterally   NEUROLOGICAL EXAM:  MENTAL STATUS:Obese, tired-looking elderly female Speech:    Speech is normal; fluent and spontaneous with normal comprehension.  Cognition:     Orientation to time, place and person     Normal recent and remote memory     Normal Attention span and concentration     Normal Language, naming, repeating,spontaneous speech     Fund of knowledge   CRANIAL NERVES: CN II: Visual fields are full to confrontation. Fundoscopic exam is normal with sharp discs and no vascular changes. Pupils are round equal and briskly reactive to light. CN III, IV, VI: extraocular movement are normal. No ptosis. CN V: Facial sensation is intact to pinprick in all 3 divisions bilaterally. Corneal responses are intact.  CN VII: Face is symmetric with normal eye closure and smile. CN VIII: Hearing is normal to rubbing fingers CN IX, X: Palate elevates symmetrically. Phonation is normal. CN XI: Head turning and shoulder shrug are intact CN XII: Tongue is midline with normal movements and no atrophy.  MOTOR: There is no pronator drift of out-stretched arms. Muscle bulk and tone are normal. Muscle strength is normal. Bilateral lower extremity pitting edema, skin discoloration  REFLEXES: Reflexes are 2+ and symmetric at the biceps, triceps, knees, and ankles. Plantar responses are flexor.  SENSORY: Length dependent decreased to to light touch, pinprick and vibratory sensation to knee level  COORDINATION: Rapid alternating movements  and fine  finger movements are intact. There is no dysmetria on finger-to-nose and heel-knee-shin.    GAIT/STANCE: She needs push up to get up from seated position, wide based, unsteady  Romberg is absent.   DIAGNOSTIC DATA (LABS, IMAGING, TESTING) - I reviewed patient records, labs, notes, testing and imaging myself where available.   ASSESSMENT AND PLAN  CHANTRICE HAGG is a 71 y.o. female   Peripheral neuropathy  She has  factor of diabetes, amiodarone use  Laboratory evaluation to rule out other treatable etiology  EMG nerve conduction study  At risk for developing prolonged QT interval with neuropathic pain medications, will give her lidocaine cream  Need to rule out lumbosacral radiculopathy, MRI of lumbar  Refer her to physical therapy for gait abnormality Restless legs syndrome  Requip 0.5 milligrams titrating to 2 mg every night Obstructive sleep apnea  Refer her to sleep study  Marcial Pacas, M.D. Ph.D.  [facility] [address], [place], [place] [address] Ph: [phone] Fax: [phone]  [id number], Jenny Reichmann, MD Prince Solian, MD

## 2016-07-31 LAB — HGB A1C W/O EAG: Hgb A1c MFr Bld: 9.4 % — ABNORMAL HIGH (ref 4.8–5.6)

## 2016-07-31 LAB — THYROID PANEL WITH TSH
Free Thyroxine Index: 3.3 (ref 1.2–4.9)
T3 Uptake Ratio: 31 % (ref 24–39)
T4, Total: 10.5 ug/dL (ref 4.5–12.0)
TSH: 4.49 u[IU]/mL (ref 0.450–4.500)

## 2016-07-31 LAB — PROTEIN ELECTROPHORESIS
A/G Ratio: 1.2 (ref 0.7–1.7)
ALBUMIN ELP: 3.5 g/dL (ref 2.9–4.4)
Alpha 1: 0.2 g/dL (ref 0.0–0.4)
Alpha 2: 0.7 g/dL (ref 0.4–1.0)
BETA: 1.2 g/dL (ref 0.7–1.3)
GAMMA GLOBULIN: 0.7 g/dL (ref 0.4–1.8)
Globulin, Total: 2.9 g/dL (ref 2.2–3.9)
Total Protein: 6.4 g/dL (ref 6.0–8.5)

## 2016-07-31 LAB — ANA W/REFLEX IF POSITIVE: Anti Nuclear Antibody(ANA): NEGATIVE

## 2016-07-31 LAB — C-REACTIVE PROTEIN: CRP: 14.2 mg/L — AB (ref 0.0–4.9)

## 2016-07-31 LAB — VITAMIN D 25 HYDROXY (VIT D DEFICIENCY, FRACTURES): VIT D 25 HYDROXY: 47.5 ng/mL (ref 30.0–100.0)

## 2016-07-31 LAB — VITAMIN B12: VITAMIN B 12: 430 pg/mL (ref 232–1245)

## 2016-07-31 LAB — CK: Total CK: 125 U/L (ref 24–173)

## 2016-07-31 LAB — COPPER, SERUM: COPPER: 164 ug/dL (ref 72–166)

## 2016-07-31 LAB — RPR: RPR Ser Ql: NONREACTIVE

## 2016-07-31 LAB — SEDIMENTATION RATE: Sed Rate: 14 mm/hr (ref 0–40)

## 2016-07-31 LAB — FOLATE

## 2016-08-01 DIAGNOSIS — R69 Illness, unspecified: Secondary | ICD-10-CM | POA: Diagnosis not present

## 2016-08-06 ENCOUNTER — Encounter (HOSPITAL_COMMUNITY): Payer: Self-pay | Admitting: Nurse Practitioner

## 2016-08-06 ENCOUNTER — Ambulatory Visit (HOSPITAL_COMMUNITY)
Admission: RE | Admit: 2016-08-06 | Discharge: 2016-08-06 | Disposition: A | Discharge status: Home / Self Care | Payer: Medicare HMO | Source: Ambulatory Visit | Attending: Nurse Practitioner | Admitting: Nurse Practitioner

## 2016-08-06 VITALS — BP 138/76 | HR 87 | Ht 69.0 in | Wt 273.8 lb

## 2016-08-06 DIAGNOSIS — F419 Anxiety disorder, unspecified: Secondary | ICD-10-CM | POA: Diagnosis not present

## 2016-08-06 DIAGNOSIS — N189 Chronic kidney disease, unspecified: Secondary | ICD-10-CM | POA: Diagnosis not present

## 2016-08-06 DIAGNOSIS — Z823 Family history of stroke: Secondary | ICD-10-CM | POA: Insufficient documentation

## 2016-08-06 DIAGNOSIS — Z7901 Long term (current) use of anticoagulants: Secondary | ICD-10-CM | POA: Insufficient documentation

## 2016-08-06 DIAGNOSIS — Z8249 Family history of ischemic heart disease and other diseases of the circulatory system: Secondary | ICD-10-CM | POA: Insufficient documentation

## 2016-08-06 DIAGNOSIS — I130 Hypertensive heart and chronic kidney disease with heart failure and stage 1 through stage 4 chronic kidney disease, or unspecified chronic kidney disease: Secondary | ICD-10-CM | POA: Diagnosis not present

## 2016-08-06 DIAGNOSIS — Z9851 Tubal ligation status: Secondary | ICD-10-CM | POA: Insufficient documentation

## 2016-08-06 DIAGNOSIS — E559 Vitamin D deficiency, unspecified: Secondary | ICD-10-CM | POA: Insufficient documentation

## 2016-08-06 DIAGNOSIS — E1122 Type 2 diabetes mellitus with diabetic chronic kidney disease: Secondary | ICD-10-CM | POA: Diagnosis not present

## 2016-08-06 DIAGNOSIS — Z794 Long term (current) use of insulin: Secondary | ICD-10-CM | POA: Insufficient documentation

## 2016-08-06 DIAGNOSIS — I481 Persistent atrial fibrillation: Secondary | ICD-10-CM | POA: Insufficient documentation

## 2016-08-06 DIAGNOSIS — J45909 Unspecified asthma, uncomplicated: Secondary | ICD-10-CM | POA: Diagnosis not present

## 2016-08-06 DIAGNOSIS — K219 Gastro-esophageal reflux disease without esophagitis: Secondary | ICD-10-CM | POA: Insufficient documentation

## 2016-08-06 DIAGNOSIS — Z6841 Body Mass Index (BMI) 40.0 and over, adult: Secondary | ICD-10-CM | POA: Insufficient documentation

## 2016-08-06 DIAGNOSIS — R0602 Shortness of breath: Secondary | ICD-10-CM | POA: Diagnosis not present

## 2016-08-06 DIAGNOSIS — Z880 Allergy status to penicillin: Secondary | ICD-10-CM | POA: Diagnosis not present

## 2016-08-06 DIAGNOSIS — Z90710 Acquired absence of both cervix and uterus: Secondary | ICD-10-CM | POA: Insufficient documentation

## 2016-08-06 DIAGNOSIS — I5030 Unspecified diastolic (congestive) heart failure: Secondary | ICD-10-CM | POA: Insufficient documentation

## 2016-08-06 DIAGNOSIS — I4819 Other persistent atrial fibrillation: Secondary | ICD-10-CM

## 2016-08-06 DIAGNOSIS — E039 Hypothyroidism, unspecified: Secondary | ICD-10-CM | POA: Diagnosis not present

## 2016-08-06 DIAGNOSIS — Z9889 Other specified postprocedural states: Secondary | ICD-10-CM | POA: Diagnosis not present

## 2016-08-06 DIAGNOSIS — Z79899 Other long term (current) drug therapy: Secondary | ICD-10-CM | POA: Insufficient documentation

## 2016-08-06 NOTE — Addendum Note (Signed)
Encounter addended by: Sherran Needs, NP on: 08/06/2016  1:33 PM<BR>    Actions taken: LOS modified

## 2016-08-06 NOTE — Progress Notes (Signed)
Patient ID: Kristine [name], female   DOB: 1945-07-02, 71 y.o.   MRN: [id number]     Primary Care Physician: Prince Solian, MD Referring Physician: Dr. Rayann Heman Cardiologist: Dr. Jettie Booze Kristine [name] is a 71 y.o. female with a h/o DM, CKD, obesity, HTN,HF, persistent afib, failing flecainide,tikosyn due to prolonged qt and currently on amiodarone. She is in the afib clinic today for increasing numbness of her feet and fingertips. 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 She has been in afib x 3 weeks since her brother died. Amiodarone was decreased to 100 mg  M,W,F last fall.  Today, she denies symptoms of orthopnea, PND, lower extremity edema, dizziness, presyncope, syncope, or neurologic sequela. Positive for shortness of breath,improved but ongoing.The patient is tolerating medications without difficulties and is otherwise without complaint today.   Past Medical History:  Diagnosis Date  . Anxiety   . Asthma   . Atrial fibrillation ([place])   . CHF (congestive heart failure) ([place])   . Chronic bronchitis ([place])   . GERD (gastroesophageal reflux disease)   . High cholesterol   . History of hiatal hernia   . HTN (hypertension)   . 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 Morbid obesity ([place])   . Osteoporosis   . Persistent atrial fibrillation ([place])   . Scoliosis   . Spinal stenosis   . Type II diabetes mellitus ([place])   . Vitamin D deficiency    Past Surgical History:  Procedure Laterality Date  . BLADDER SUSPENSION  1980s  . CARDIAC CATHETERIZATION  11/16/09   SMOOTH AND NORMAL  . CARDIOVERSION N/A 01/07/2015   Procedure: CARDIOVERSION;  Surgeon: Skeet Latch, MD;  Location: [facility] ENDOSCOPY;  Service: Cardiovascular;  Laterality: N/A;  . CARDIOVERSION N/A 03/11/2015   Procedure: CARDIOVERSION;  Surgeon: Skeet Latch, MD;  Location: [facility];  Service: Cardiovascular;  Laterality: N/A;  . CARPAL TUNNEL  RELEASE Left 1970s  . CATARACT EXTRACTION W/ INTRAOCULAR LENS  IMPLANT, BILATERAL Bilateral ~ 2010  . COLONOSCOPY  08/14/08  . FINGER FRACTURE SURGERY Right 1970s   "ring finger"  . FRACTURE SURGERY    . LAPAROSCOPIC CHOLECYSTECTOMY  1980s  . TUBAL LIGATION    . VAGINAL HYSTERECTOMY  1980    Current Outpatient Prescriptions  Medication Sig Dispense Refill  . ALPRAZolam (XANAX) 0.25 MG tablet Take 0.125-0.25 mg by mouth 2 (two) times daily as needed for anxiety.   0  . amiodarone (PACERONE) 200 MG tablet Take 1 pill on Mondays, Wednesdays, and Fridays only 180 tablet 2  . BD PEN NEEDLE NANO U/F 32G X 4 MM MISC Inject 1 each as directed See admin instructions. Use pen needles with insulin pens daily  11  . clindamycin (CLEOCIN) 300 MG capsule TAKE 2 CAPSULES BY MOUTH 1 HOUR PRIOR TO DENTAL APPOINTMENT  0  . esomeprazole (NEXIUM) 40 MG capsule Take 40 mg by mouth daily as needed (for acid reflux).     . febuxostat (ULORIC) 40 MG tablet Take 40 mg by mouth daily.    . furosemide (LASIX) 80 MG tablet TAKE 1 TABLET (80 MG TOTAL) BY MOUTH 2 (TWO) TIMES DAILY. 180 tablet 3  . HUMALOG KWIKPEN 200 UNIT/ML SOPN Inject 16 Units into the skin 3 (three) times daily with meals.   6  . insulin detemir (LEVEMIR) 100 UNIT/ML injection Inject 66 Units into the skin 2 (two) times daily.     Marland Kitchen KLOR-CON  10 10 MEQ tablet Take 20 mEq by mouth daily.  11  . levothyroxine (SYNTHROID, LEVOTHROID) 25 MCG tablet Take 1 tablet by mouth daily.    Marland Kitchen losartan (COZAAR) 100 MG tablet TAKE 1 TABLET BY MOUTH EVERY DAY 90 tablet 3  . Melatonin 1 MG CAPS Take by mouth at bedtime.    . metoprolol succinate (TOPROL-XL) 50 MG 24 hr tablet Take 1 tablet (50 mg total) by mouth 2 (two) times daily. 270 tablet 3  . Vitamin D, Ergocalciferol, (DRISDOL) 50000 UNITS CAPS Take 50,000 Units by mouth every 7 (seven) days. Takes on Thursday    . XARELTO 15 MG TABS tablet Take 15 mg by mouth daily.    . colchicine 0.6 MG tablet Take 0.6 mg  by mouth daily.    . Lidocaine 0.5 % GEL Apply 4 g topically 3 (three) times daily. (Patient not taking: Reported on 08/06/2016) 170 g 6   No current facility-administered medications for this encounter.     Allergies  Allergen Reactions  . Albuterol Other (See Comments)    REACTION: Tachycardia- AFib  . Epinephrine Other (See Comments)    Increases heart rate per patient.   . Penicillins Other (See Comments)    REACTION: flushing \\T \ hot  . Bee Venom Other (See Comments)    "makes me nervous"  . Ivp Dye [Iodinated Diagnostic Agents] Other (See Comments)    flushing    Social History   Social History  . Marital status: Married    Spouse name: N/A  . Number of children: 4  . Years of education: 14   Occupational History  . Not on file.   Social History Main Topics  . Smoking status: Never Smoker  . Smokeless tobacco: Never Used  . Alcohol use No  . Drug use: No  . Sexual activity: Yes   Other Topics Concern  . Not on file   Social History Narrative   Pt lives in [place] with spouse.   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   Right-handed.   1 cup caffeine per day.    Family History  Problem Relation Age of Onset  . Stroke Father   . Hypertension Father   . Atrial fibrillation Father        HAD MURMUR  . Heart failure Mother   . Congestive Heart Failure Mother   . Hypertension Mother   . Hypertension Brother   . Hypertension Sister   . Hypertension Son   . CAD Sister        EARLY  . CAD Brother        EARLY    ROS- All systems are reviewed and negative except as per the HPI above  Physical Exam: Vitals:   08/06/16 0931  BP: 138/76  Pulse: 87  Weight: 273 lb 12.8 oz (124.2 kg)  Height: 5\' 9"  (1.753 m)    GEN- The patient is well appearing, alert and oriented x 3 today.   Head- normocephalic, atraumatic Eyes-  Sclera clear, conjunctiva  pink Ears- hearing intact Oropharynx- clear Neck- supple, no JVP Lymph- no cervical lymphadenopathy Lungs- Clear to ausculation bilaterally, normal work of breathing Heart- irregular rate and rhythm, no murmurs, rubs or gallops, PMI not laterally displaced GI- soft, NT, ND, + BS Extremities- no clubbing, cyanosis, or edema MS- no significant deformity or atrophy Skin- no rash or lesion  Psych- euthymic mood, full affect Neuro- strength and sensation are intact  EKG-  Afib at 87 bpm, qrs int 104 ms, qtc 534 ms Epic records reviewed    Assessment and Plan: 1. Afib  Recent persistent rate controlled afib but wanting to come off amio due to neuropathy symptoms Continue amiodarone 100 mg M,W,F for now Spoke to Dr. Rayann Heman and he will see her back to consider ablation since no other good antiarrythmics to offer pt Continue metoprolol Xarelto 15 mg currently at PCP's adjustment due to concerns of renal function but I get crcl cal at 52.14 ml/min based on creatinine of 2.02, by guidelines she should be at 20 mg daily.  2.Chronic shortness of breath  Recently evaluated by Pulmonary, no toxcitiy found, and told  Ok to continue amiodarone Felt dyspnea is mutltifactorial  3. Diastolic heart failure  Lasix 80 mg bid Limit fluids to no more than 6 glasses a day as per nutritionist Continue daily weights  4. HTN  Controlled  5. Hypothyroidism On synthroid  Per pcp  5. Obesity Struggles with weight loss  6. DM Poorly managed with HGB A1c at 9.4   F/u with Dr. Rayann Heman to discuss ablation  Butch Penny C. Kassiah Mccrory, [facility] [address] [place], [place] [address] [phone]

## 2016-08-10 ENCOUNTER — Ambulatory Visit
Admission: RE | Admit: 2016-08-10 | Discharge: 2016-08-10 | Disposition: A | Discharge status: Home / Self Care | Payer: Medicare HMO | Source: Ambulatory Visit | Attending: Neurology | Admitting: Neurology

## 2016-08-10 DIAGNOSIS — M545 Low back pain: Secondary | ICD-10-CM | POA: Diagnosis not present

## 2016-08-10 DIAGNOSIS — Z794 Long term (current) use of insulin: Secondary | ICD-10-CM

## 2016-08-10 DIAGNOSIS — M48061 Spinal stenosis, lumbar region without neurogenic claudication: Secondary | ICD-10-CM

## 2016-08-10 DIAGNOSIS — I4819 Other persistent atrial fibrillation: Secondary | ICD-10-CM

## 2016-08-10 DIAGNOSIS — G6289 Other specified polyneuropathies: Secondary | ICD-10-CM

## 2016-08-10 DIAGNOSIS — E1142 Type 2 diabetes mellitus with diabetic polyneuropathy: Secondary | ICD-10-CM

## 2016-08-10 DIAGNOSIS — G4719 Other hypersomnia: Secondary | ICD-10-CM

## 2016-08-12 ENCOUNTER — Telehealth: Payer: Self-pay | Admitting: Neurology

## 2016-08-12 NOTE — Telephone Encounter (Signed)
Unable to reach patient - left message for call back.  I was able to speak with her daughter, Di Kindle (on HIPAA) - she is aware of the findings and will notify her mother of the results.  She have her keep her pending appt on 09/02/16 for further discussion.

## 2016-08-12 NOTE — Telephone Encounter (Signed)
Please call back patient MRI of the lumbar showed multiple degenerative disc disease,most severe is at the lower lumbar region L4-5, L5-S1,  Will review films together at her next visit  IMPRESSION: This MRI of the lumbar spine without contrast shows the following: 1.   At L1-L2, there is minimal retrolisthesis associated with mild facet hypertrophy and small joint effusions. This has progressed when compared to the 10/05/2004 MRI. There is no nerve root compression at this level. 2.   At L2-L3, there are stable degenerative changes causing moderate left lateral recess stenosis and mild bilateral foraminal narrowing and mild right lateral recess stenosis. There does not appear to be any nerve root compression. 3.   At L3-L4, there is borderline spinal stenosis.   There is moderate left lateral recess stenosis. Although there is no definite nerve root compression there is some encroachment upon the left L4 nerve root. Changes at this level have progressed when compared to the previous MRI. 4.  At L4-L5, there are progressive degenerative changes causing moderate left lateral recess stenosis causing some encroachment upon the left L5 nerve root. 5.  At L5-S1, edema is noted within the disc. There are degenerative changes causing moderately severe left lateral recess stenosis that could lead to left S1 nerve root compression. Changes at this level have progressed when compared to the previous MRI

## 2016-08-13 DIAGNOSIS — Z6841 Body Mass Index (BMI) 40.0 and over, adult: Secondary | ICD-10-CM | POA: Diagnosis not present

## 2016-08-13 DIAGNOSIS — I480 Paroxysmal atrial fibrillation: Secondary | ICD-10-CM | POA: Diagnosis not present

## 2016-08-13 DIAGNOSIS — E1140 Type 2 diabetes mellitus with diabetic neuropathy, unspecified: Secondary | ICD-10-CM | POA: Diagnosis not present

## 2016-08-13 DIAGNOSIS — M10071 Idiopathic gout, right ankle and foot: Secondary | ICD-10-CM | POA: Diagnosis not present

## 2016-08-13 DIAGNOSIS — R69 Illness, unspecified: Secondary | ICD-10-CM | POA: Diagnosis not present

## 2016-08-13 DIAGNOSIS — N184 Chronic kidney disease, stage 4 (severe): Secondary | ICD-10-CM | POA: Diagnosis not present

## 2016-08-13 DIAGNOSIS — I509 Heart failure, unspecified: Secondary | ICD-10-CM | POA: Diagnosis not present

## 2016-08-13 DIAGNOSIS — E1165 Type 2 diabetes mellitus with hyperglycemia: Secondary | ICD-10-CM | POA: Diagnosis not present

## 2016-08-13 NOTE — Telephone Encounter (Signed)
Returned call and left detailed message (ok per DPR) with MRI findings.  Dr. Krista Blue will further discuss at her next appt on 09/02/16.  Provided our number to call back with questions.

## 2016-08-13 NOTE — Telephone Encounter (Signed)
Patient called office to speak with RN to receive MRI results.  Please call

## 2016-08-18 ENCOUNTER — Ambulatory Visit
Admission: RE | Admit: 2016-08-18 | Discharge: 2016-08-18 | Disposition: A | Discharge status: Home / Self Care | Payer: Medicare HMO | Source: Ambulatory Visit | Attending: Internal Medicine | Admitting: Internal Medicine

## 2016-08-18 DIAGNOSIS — Z1231 Encounter for screening mammogram for malignant neoplasm of breast: Secondary | ICD-10-CM | POA: Diagnosis not present

## 2016-08-24 ENCOUNTER — Encounter: Payer: Self-pay | Admitting: Internal Medicine

## 2016-08-24 ENCOUNTER — Ambulatory Visit (INDEPENDENT_AMBULATORY_CARE_PROVIDER_SITE_OTHER): Payer: Medicare HMO | Admitting: Internal Medicine

## 2016-08-24 VITALS — BP 142/89 | HR 108 | Ht 68.0 in | Wt 264.8 lb

## 2016-08-24 DIAGNOSIS — I5032 Chronic diastolic (congestive) heart failure: Secondary | ICD-10-CM

## 2016-08-24 DIAGNOSIS — I4819 Other persistent atrial fibrillation: Secondary | ICD-10-CM

## 2016-08-24 DIAGNOSIS — I481 Persistent atrial fibrillation: Secondary | ICD-10-CM

## 2016-08-24 DIAGNOSIS — I119 Hypertensive heart disease without heart failure: Secondary | ICD-10-CM | POA: Diagnosis not present

## 2016-08-24 MED ORDER — AMIODARONE HCL 200 MG PO TABS: 200.0000 mg | ORAL_TABLET | Freq: Every day | ORAL | 11 refills | Status: DC

## 2016-08-24 NOTE — Progress Notes (Signed)
PCP: Prince Solian, MD Primary Cardiologist: DR Jettie Booze Kristine [name] is a 71 y.o. female who presents today for routine electrophysiology followup.  Since last being seen in our clinic, the patient reports doing reasonably well.   She now finds herself back in afib.  She has slightly worsening edema.  She is chronically SOB and not very active.  Today, she denies symptoms of palpitations, chest pain,  dizziness, presyncope, or syncope.  She is concerned about her diabetic neuropathy. The patient is otherwise without complaint today.   Past Medical History:  Diagnosis Date  . Anxiety   . Asthma   . Atrial fibrillation ([place])   . CHF (congestive heart failure) ([place])   . Chronic bronchitis ([place])   . GERD (gastroesophageal reflux disease)   . High cholesterol   . History of hiatal hernia   . HTN (hypertension)   . Mild aortic sclerosis ([place])   . Morbid obesity ([place])   . Osteoporosis   . Persistent atrial fibrillation ([place])   . Scoliosis   . Spinal stenosis   . Type II diabetes mellitus ([place])   . Vitamin D deficiency    Past Surgical History:  Procedure Laterality Date  . BLADDER SUSPENSION  1980s  . CARDIAC CATHETERIZATION  11/16/09   SMOOTH AND NORMAL  . CARDIOVERSION N/A 01/07/2015   Procedure: CARDIOVERSION;  Surgeon: Skeet Latch, MD;  Location: [facility] ENDOSCOPY;  Service: Cardiovascular;  Laterality: N/A;  . CARDIOVERSION N/A 03/11/2015   Procedure: CARDIOVERSION;  Surgeon: Skeet Latch, MD;  Location: [place];  Service: Cardiovascular;  Laterality: N/A;  . CARPAL TUNNEL RELEASE Left 1970s  . CATARACT EXTRACTION W/ INTRAOCULAR LENS  IMPLANT, BILATERAL Bilateral ~ 2010  . COLONOSCOPY  08/14/08  . FINGER FRACTURE SURGERY Right 1970s   "ring finger"  . FRACTURE SURGERY    . LAPAROSCOPIC CHOLECYSTECTOMY  1980s  . TUBAL LIGATION    . VAGINAL HYSTERECTOMY  1980    ROS- all systems are reviewed and negatives except as per HPI above  Current Outpatient  Prescriptions  Medication Sig Dispense Refill  . ALPRAZolam (XANAX) 0.25 MG tablet Take 0.125-0.25 mg by mouth 2 (two) times daily as needed for anxiety.   0  . amiodarone (PACERONE) 200 MG tablet Take 1 tablet by mouth on Mondays, Wednesdays, and Fridays only    . BD PEN NEEDLE NANO U/F 32G X 4 MM MISC Inject 1 each as directed See admin instructions. Use pen needles with insulin pens daily  11  . clindamycin (CLEOCIN) 300 MG capsule TAKE 2 CAPSULES BY MOUTH 1 HOUR PRIOR TO DENTAL APPOINTMENT  0  . colchicine 0.6 MG tablet Take 0.6 mg by mouth daily.    Marland Kitchen esomeprazole (NEXIUM) 40 MG capsule Take 40 mg by mouth daily as needed (for acid reflux).     . febuxostat (ULORIC) 40 MG tablet Take 40 mg by mouth daily.    . furosemide (LASIX) 80 MG tablet TAKE 1 TABLET (80 MG TOTAL) BY MOUTH 2 (TWO) TIMES DAILY. 180 tablet 3  . HUMALOG KWIKPEN 200 UNIT/ML SOPN Inject 16 Units into the skin 3 (three) times daily with meals.   6  . insulin detemir (LEVEMIR) 100 UNIT/ML injection Inject 66 Units into the skin 2 (two) times daily.     Marland Kitchen KLOR-CON 10 10 MEQ tablet Take 20 mEq by mouth daily.  11  . levothyroxine (SYNTHROID, LEVOTHROID) 25 MCG tablet Take 1 tablet by mouth daily.    . Lidocaine 0.5 %  GEL Apply 4 g topically 3 (three) times daily. 170 g 6  . losartan (COZAAR) 100 MG tablet TAKE 1 TABLET BY MOUTH EVERY DAY 90 tablet 3  . metolazone (ZAROXOLYN) 2.5 MG tablet Take 2.5 mg by mouth as directed.    . metoprolol succinate (TOPROL-XL) 50 MG 24 hr tablet Take 1 tablet (50 mg total) by mouth 2 (two) times daily. 270 tablet 3  . Vitamin D, Ergocalciferol, (DRISDOL) 50000 UNITS CAPS Take 50,000 Units by mouth every 7 (seven) days. Takes on Thursday    . XARELTO 15 MG TABS tablet Take 15 mg by mouth daily.     No current facility-administered medications for this visit.     Physical Exam: Vitals:   08/24/16 1229  BP: (!) 142/89  Pulse: (!) 108  SpO2: 96%  Weight: 264 lb 12.8 oz (120.1 kg)  Height:  5\' 8"  (1.727 m)    GEN- The patient is well appearing, alert and oriented x 3 today.   Head- normocephalic, atraumatic Eyes-  Sclera clear, conjunctiva pink Ears- hearing intact Oropharynx- clear Lungs- Clear to ausculation bilaterally, normal work of breathing Heart- irregular rate and rhythm, no murmurs, rubs or gallops, PMI not laterally displaced GI- soft, NT, ND, + BS Extremities- no clubbing, cyanosis, +1 edema  EKG tracing ordered today is personally reviewed and shows afib, V rate 108 bpm, incomplete RBBB, QTc 536 msec  Records from [facility] are reviewed today  Assessment and Plan:  1. Longstanding persistent afib She has done better than expected with low dose amiodarone.  Given prolonged QT, she cannot take tikosyn or sotalol.  She really does not have other AAD options.  She has chronic difficulty with diabetic neuropathy.  She wonders if amiodarone could be making this worse.  I have informed her that this is indeed a possibility.  I have advised that she might consider stopping amiodarone and considering rate control long term.  Therapeutic strategies for afib including rate control and rhythm control were discussed in detail with the patient today.  We discussed stopping amiodarone, increasing amiodarone to standard 200mg  daily dose, or consideration of ablation.  I would anticipate that her success with ablation would be lower given her longstanding persistent afib.  Risk, benefits, and alternatives to EP study and radiofrequency ablation for afib were also discussed in detail today.  She is very clear that she is not interested in ablation.  She would prefer to increase her amiodarone to 200mg  daily and continue to follow her symptoms of neuropathy closely with her neurologist and primary care physician.  She will follow-up in 3-4 weeks in the AF clinic.  If still in afib at that time, would advise cardioversion.  Ultimately, I do think that rate control may be her best  option as she did well with this approach for several years. Continue long term anticoagulation  2. Hypertensive cardiovascular disease Stable No change required today 2 gram sodium diet  3. Overweight. Body mass index is 40.26 kg/m. She is working on lifestyle modification and is having some success.  4. Diastolic dysfunction Stable No change required today  Follow-up in the AF clinic Follow-up with Dr Acie Fredrickson as scheduled I will see when needed  Thompson Grayer MD, [facility] 08/24/2016 1:05 PM

## 2016-08-24 NOTE — Patient Instructions (Signed)
Medication Instructions:  Your physician has recommended you make the following change in your medication:  1) Increase Amiodarone to 200mg  daily    Labwork: None ordered   Testing/Procedures: None ordered   Follow-Up: Your physician recommends that you schedule a follow-up appointment in: 3 weeks with Roderic Palau, NP   Any Other Special Instructions Will Be Listed Below (If Applicable).  Wear support hose     If you need a refill on your cardiac medications before your next appointment, please call your pharmacy.

## 2016-08-25 DIAGNOSIS — M79673 Pain in unspecified foot: Secondary | ICD-10-CM | POA: Diagnosis not present

## 2016-08-25 DIAGNOSIS — B351 Tinea unguium: Secondary | ICD-10-CM | POA: Diagnosis not present

## 2016-08-25 DIAGNOSIS — E1142 Type 2 diabetes mellitus with diabetic polyneuropathy: Secondary | ICD-10-CM | POA: Diagnosis not present

## 2016-08-25 DIAGNOSIS — L84 Corns and callosities: Secondary | ICD-10-CM | POA: Diagnosis not present

## 2016-08-27 ENCOUNTER — Encounter (INDEPENDENT_AMBULATORY_CARE_PROVIDER_SITE_OTHER): Payer: Self-pay

## 2016-08-27 ENCOUNTER — Ambulatory Visit (INDEPENDENT_AMBULATORY_CARE_PROVIDER_SITE_OTHER): Payer: Medicare HMO | Admitting: Cardiovascular Disease

## 2016-08-27 ENCOUNTER — Encounter: Payer: Self-pay | Admitting: Cardiovascular Disease

## 2016-08-27 VITALS — BP 130/74 | HR 108 | Ht 68.5 in | Wt 265.5 lb

## 2016-08-27 DIAGNOSIS — I4819 Other persistent atrial fibrillation: Secondary | ICD-10-CM

## 2016-08-27 DIAGNOSIS — I5032 Chronic diastolic (congestive) heart failure: Secondary | ICD-10-CM | POA: Diagnosis not present

## 2016-08-27 DIAGNOSIS — I481 Persistent atrial fibrillation: Secondary | ICD-10-CM | POA: Diagnosis not present

## 2016-08-27 MED ORDER — DILTIAZEM HCL ER COATED BEADS 120 MG PO CP24: 120.0000 mg | ORAL_CAPSULE | Freq: Every day | ORAL | 3 refills | Status: DC

## 2016-08-27 NOTE — Patient Instructions (Signed)
Medication Instructions:  1) START Cardizem CD 120mg  once daily  Labwork: None  Testing/Procedures: None  Follow-Up: Your physician wants you to follow-up in: 6 months with Dr. Acie Fredrickson with an EKG. You will receive a reminder letter in the mail two months in advance. If you don't receive a letter, please call our office to schedule the follow-up appointment.   Any Other Special Instructions Will Be Listed Below (If Applicable).     If you need a refill on your cardiac medications before your next appointment, please call your pharmacy.

## 2016-08-27 NOTE — Progress Notes (Signed)
Cardiology Office Note   Date:  08/27/2016   ID:  Kristine [name], DOB 01/07/1946, MRN [id number]  PCP:  Prince Solian, MD  Cardiologist:   Mertie Moores, MD   No chief complaint on file.  1. Atrial fibrillation 2. Diabetes mellitus 3. Chronic diastolic CHF 4. Obesity.   History of Present Illness:  Kristine [name] is a 71 year old female with a history of atrial fibrillation. She has done well since I last saw her. She's not had any episodes of chest pain or shortness breath. Her blood pressure readings have been normal.  She is tolerating the Xarelto fairly well. She has some occasional bleeding from the gums. She enjoys not having to check her INR levels.   She has occasional palpitations and heart racing. This typically can rest and these will resolve. She is able to wak about 10 minutes at a time.   Oct. 24, 2014:  June 23, 2013:  Kristine [name]'s BP is elevated. She had a recent echocardiogram that revealed a left ventricle systolic function that was at the lower limits of normal-50%. Study Conclusions  - Left ventricle: The cavity size was normal. Wall thickness was increased in a pattern of mild LVH. The estimated ejection fraction was 50%. Diffuse hypokinesis. Features are consistent with a pseudonormal left ventricular filling pattern, with concomitant abnormal relaxation and increased filling pressure (grade 2 diastolic dysfunction). - Aortic valve: There was no stenosis. - Mitral valve: Mildly calcified annulus. Mildly calcified leaflets . Trivial regurgitation. - Left atrium: The atrium was moderately dilated. - Right ventricle: The cavity size was normal. Systolic function was mildly reduced. - Right atrium: The atrium was moderately dilated. - Systemic veins: IVC measured 1.7 cm with < 50% respirophasic variation, suggesting RA pressure 8 mmHg. - Pericardium, extracardiac: A trivial pericardial effusion was identified posterior to the heart  She was  having some abdominal pain and   September 29, 2013:  Kristine [name] is doing well. Stressed about coming here.   Jan. 7, 2015:  Kristine [name] is a 71 yo with hx of Afib and diastolic dysfunction She converted to NSR in the hospital with flecainide. She was tried on [place] but her QT interval prolonged. Was changed to Flecainide and has maintained NSR.  She is exercising regularly - 30 minutes a day. Is on Lasix 80 mg a day and mtalazone 2. 5 once a week. Is still retaining fluid. Trying to avoid salt in food.    June 11, 2014:   Kristine [name] is a 71 y.o. female who presents for follow-up of her atrial fibrillation. She has had some issues with fluid retention and  has been on metalazone.   Sept. 19, 2016:  Doing well.  Has had some shortness of breath - improved with increasing lasix to 80 BID .  BP is still high this am.  BP at home has been normal .  June 18, 2015:  She has tried [place] but had prolonged QT - so it was stopped Tried on flecainide Was tried on amiodarone  Had a cardioversion in Dec.   Amiodarone dose was lowered to 200 mg a day and she is now back in atrial fib She is not as symptomatic today  She is very fatigued. Has not been sleeping well at all Has lots of arm fatigue in her arms with any activity   Nov. 2, 2017:    Still short of breath No CP  Active,  Has a pins and needle sensation   August 27, 2016:  Kristine [name] is seen back today for follow up of her atrial fib  Has failed Flecainide, tikosyn and now  Is on amiodarone.   We had her on a reduced dose of amiodarone due to symptoms of peripheral neuropathy. The dose was increased up to 200 mg daily by Dr. Rayann Heman.  She is tentatively scheduled for cardioversion. She does not want to consider an ablation .     Past Medical History:  Diagnosis Date  . Anxiety   . Asthma   . Atrial fibrillation ([place])   . CHF (congestive heart failure) ([place])   . Chronic bronchitis ([place])   . GERD (gastroesophageal  reflux disease)   . High cholesterol   . History of hiatal hernia   . HTN (hypertension)   . Mild aortic sclerosis ([place])   . Morbid obesity ([place])   . Osteoporosis   . Persistent atrial fibrillation ([place])   . Scoliosis   . Spinal stenosis   . Type II diabetes mellitus ([place])   . Vitamin D deficiency     Past Surgical History:  Procedure Laterality Date  . BLADDER SUSPENSION  1980s  . CARDIAC CATHETERIZATION  11/16/09   SMOOTH AND NORMAL  . CARDIOVERSION N/A 01/07/2015   Procedure: CARDIOVERSION;  Surgeon: Skeet Latch, MD;  Location: [facility] ENDOSCOPY;  Service: Cardiovascular;  Laterality: N/A;  . CARDIOVERSION N/A 03/11/2015   Procedure: CARDIOVERSION;  Surgeon: Skeet Latch, MD;  Location: [place];  Service: Cardiovascular;  Laterality: N/A;  . CARPAL TUNNEL RELEASE Left 1970s  . CATARACT EXTRACTION W/ INTRAOCULAR LENS  IMPLANT, BILATERAL Bilateral ~ 2010  . COLONOSCOPY  08/14/08  . FINGER FRACTURE SURGERY Right 1970s   "ring finger"  . FRACTURE SURGERY    . LAPAROSCOPIC CHOLECYSTECTOMY  1980s  . TUBAL LIGATION    . VAGINAL HYSTERECTOMY  1980     Current Outpatient Prescriptions  Medication Sig Dispense Refill  . ALPRAZolam (XANAX) 0.25 MG tablet Take 0.125-0.25 mg by mouth 2 (two) times daily as needed for anxiety.   0  . amiodarone (PACERONE) 200 MG tablet Take 1 tablet (200 mg total) by mouth daily. 30 tablet 11  . BD PEN NEEDLE NANO U/F 32G X 4 MM MISC Inject 1 each as directed See admin instructions. Use pen needles with insulin pens daily  11  . clindamycin (CLEOCIN) 300 MG capsule TAKE 2 CAPSULES BY MOUTH 1 HOUR PRIOR TO DENTAL APPOINTMENT  0  . colchicine 0.6 MG tablet Take 0.6 mg by mouth daily.    Marland Kitchen esomeprazole (NEXIUM) 40 MG capsule Take 40 mg by mouth daily as needed (for acid reflux).     . furosemide (LASIX) 80 MG tablet TAKE 1 TABLET (80 MG TOTAL) BY MOUTH 2 (TWO) TIMES DAILY. 180 tablet 3  . HUMALOG KWIKPEN 200 UNIT/ML SOPN Inject 16 Units into the  skin 3 (three) times daily with meals.   6  . insulin detemir (LEVEMIR) 100 UNIT/ML injection Inject 66 Units into the skin 2 (two) times daily.     Marland Kitchen KLOR-CON 10 10 MEQ tablet Take 20 mEq by mouth daily.  11  . levothyroxine (SYNTHROID, LEVOTHROID) 25 MCG tablet Take 1 tablet by mouth daily.    . Lidocaine 0.5 % GEL Apply 4 g topically 3 (three) times daily. 170 g 6  . losartan (COZAAR) 100 MG tablet TAKE 1 TABLET BY MOUTH EVERY DAY 90 tablet 3  . metolazone (ZAROXOLYN) 2.5 MG tablet Take 2.5 mg by mouth as directed.    Marland Kitchen  metoprolol succinate (TOPROL-XL) 50 MG 24 hr tablet Take 1 tablet (50 mg total) by mouth 2 (two) times daily. 270 tablet 3  . Vitamin D, Ergocalciferol, (DRISDOL) 50000 UNITS CAPS Take 50,000 Units by mouth every 7 (seven) days. Takes on Thursday    . XARELTO 15 MG TABS tablet Take 15 mg by mouth daily.     No current facility-administered medications for this visit.     Allergies:   Albuterol; Epinephrine; Penicillins; Bee venom; and Ivp dye [iodinated diagnostic agents]    Social History:  The patient  reports that she has never smoked. She has never used smokeless tobacco. She reports that she does not drink alcohol or use drugs.   Family History:  The patient's family history includes Atrial fibrillation in her father; CAD in her brother and sister; Congestive Heart Failure in her mother; Heart failure in her mother; Hypertension in her brother, father, mother, sister, and son; Stroke in her father.    ROS:  Please see the history of present illness.    Review of Systems: Constitutional:  denies fever, chills, diaphoresis, appetite change and fatigue.  HEENT: denies photophobia, eye pain, redness, hearing loss, ear pain, congestion, sore throat, rhinorrhea, sneezing, neck pain, neck stiffness and tinnitus.  Respiratory: denies SOB, DOE, cough, chest tightness, and wheezing.  Cardiovascular: denies chest pain, palpitations and leg swelling.  Gastrointestinal:  denies nausea, vomiting, abdominal pain, diarrhea, constipation, blood in stool.  Genitourinary: denies dysuria, urgency, frequency, hematuria, flank pain and difficulty urinating.  Musculoskeletal: denies  myalgias, back pain, joint swelling, arthralgias and gait problem.   Skin: denies pallor, rash and wound.  Neurological: denies dizziness, seizures, syncope, weakness, light-headedness, numbness and headaches.   Hematological: denies adenopathy, easy bruising, personal or family bleeding history.  Psychiatric/ Behavioral: denies suicidal ideation, mood changes, confusion, nervousness, sleep disturbance and agitation.       All other systems are reviewed and negative.    PHYSICAL EXAM: VS:  BP 130/74   Pulse (!) 108   Ht 5' 8.5" (1.74 m)   Wt 265 lb 8 oz (120.4 kg)   SpO2 95%   BMI 39.78 kg/m  , BMI Body mass index is 39.78 kg/m. GEN: Well nourished, well developed, in no acute distress  HEENT: normal  Neck: no JVD, carotid bruits, or masses Cardiac: Irreg. Irreg. ; no murmurs, rubs, or gallops, trace - 1+ edema , with chronic stasis changes.  Respiratory:  clear to auscultation bilaterally, normal work of breathing GI: soft, nontender, nondistended, + BS MS: no deformity or atrophy  Skin: warm and dry, no rash Neuro:  Strength and sensation are intact Psych: normal   EKG:  EKG is ordered today. Nov. 2, 2017:   Sinus brady at 46.   NS ST abn.    Recent Labs: 02/20/2016: ALT 30; BUN 31; Creatinine, Ser 2.02; Potassium 3.3; Sodium 139 07/29/2016: TSH 4.490    Lipid Panel No results found for: CHOL, TRIG, HDL, CHOLHDL, VLDL, LDLCALC, LDLDIRECT    Wt Readings from Last 3 Encounters:  08/27/16 265 lb 8 oz (120.4 kg)  08/24/16 264 lb 12.8 oz (120.1 kg)  08/06/16 273 lb 12.8 oz (124.2 kg)      Other studies Reviewed: Additional studies/ records that were reviewed today include: . Review of the above records demonstrates:    ASSESSMENT AND PLAN:  1. Atrial  fibrillation -      She has shortness of breath  Will continue anticoagulation and rate control  Has failed  Flecainide, tikosyn and now  Is on amiodarone.   We had her on a reduced dose of amiodarone,  The dose was increased up to 200 mg daily by Dr. Rayann Heman.  Had PFTs in Aug. 2017 Will continue to see Dr. Rayann Heman / Roderic Palau, NP  Will add Cardizem 120 mg CD a day for better rate control  Follow HR and BP Watch for diarrhea with combination with colchicine   2. Diabetes mellitus  3. Chronic diastolic CHF - better on Lasix 80 bid .  Continue .   Takes metalazone only as needed   4. Obesity.   Encouraged diet and weight loss.   I think that good bit of her DOE is due to her morbid obesity  She has had DOE for years.    We discussed the importance of weight loss.   5. Insomnia:    Asked her to review this issue with her primary MD   Current medicines are reviewed at length with the patient today.  The patient does not have concerns regarding medicines.  The following changes have been made:  no change  Labs/ tests ordered today include:  No orders of the defined types were placed in this encounter.   Disposition:   FU with me in 6 months      Signed, Mertie Moores, MD  08/27/2016 9:49 AM    [facility] [address], [place], [place]  [address] Phone: [phone]; Fax: [phone]

## 2016-09-02 ENCOUNTER — Ambulatory Visit (INDEPENDENT_AMBULATORY_CARE_PROVIDER_SITE_OTHER): Payer: Medicare HMO | Admitting: Neurology

## 2016-09-02 ENCOUNTER — Ambulatory Visit (INDEPENDENT_AMBULATORY_CARE_PROVIDER_SITE_OTHER): Payer: Self-pay | Admitting: Neurology

## 2016-09-02 DIAGNOSIS — G6289 Other specified polyneuropathies: Secondary | ICD-10-CM | POA: Diagnosis not present

## 2016-09-02 DIAGNOSIS — M48061 Spinal stenosis, lumbar region without neurogenic claudication: Secondary | ICD-10-CM | POA: Diagnosis not present

## 2016-09-02 DIAGNOSIS — I481 Persistent atrial fibrillation: Secondary | ICD-10-CM

## 2016-09-02 DIAGNOSIS — G4719 Other hypersomnia: Secondary | ICD-10-CM | POA: Diagnosis not present

## 2016-09-02 DIAGNOSIS — E1142 Type 2 diabetes mellitus with diabetic polyneuropathy: Secondary | ICD-10-CM | POA: Diagnosis not present

## 2016-09-02 DIAGNOSIS — Z794 Long term (current) use of insulin: Secondary | ICD-10-CM | POA: Diagnosis not present

## 2016-09-02 DIAGNOSIS — I4819 Other persistent atrial fibrillation: Secondary | ICD-10-CM

## 2016-09-02 DIAGNOSIS — Z0289 Encounter for other administrative examinations: Secondary | ICD-10-CM

## 2016-09-02 MED ORDER — CLONAZEPAM 0.5 MG PO TABS: 1.0000 mg | ORAL_TABLET | Freq: Every day | ORAL | 3 refills | Status: DC

## 2016-09-02 NOTE — Procedures (Signed)
Full Name: Aviance Cooperwood Gender: Female MRN #: [id number] Date of Birth: [birth year]    Visit Date: 09/02/16 08:05 Age: 71 Years 19 Months Old Examining Physician: Marcial Pacas, MD  Referring Physician: Krista Blue, MD History: 71 year old female, with history of diabetes poorly controlled A1c 9.4, presenting with bilateral lower extremity paresthesia  Summary of the test: Nerve conduction study: Bilateral sural, superficial peroneal sensory responses were absent. Bilateral peroneal to EDB motor responses were normal. Bilateral tibial motor responses showed mildly decreased to C map amplitude at distal stimulation side, nonresponsive and proximal stimulation side, which could due to technical difficulty because of her big body habitus.  Bilateral ulnar sensory and motor responses were normal.  Bilateral median sensory responses showed mildly prolonged peak latency, with normal snap amplitude. Bilateral median motor responses showed mildly prolonged with normal C map amplitude.  Electromyography:  Selected needle examination was performed at bilateral lower extremity muscles and bilateral lumbar sacral paraspinal muscles. There is mild increased insertional activity, positive waves at right abductor pollicis hallucis, mild decreased recruitment, otherwise there was no significant abnormality.  Conclusion: This is an abnormal study. 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 There is no evidence of lumbosacral radiculopathy. There is also evidence of bilateral moderate carpal tunnel syndromes.     ------------------------------- Marcial Pacas, M.D.  [facility] [place], [place] [address] Tel: [phone] Fax: [phone]        [facility]    Nerve / Sites Muscle Latency Ref. Amplitude Ref. Rel Amp Segments Distance Velocity Ref. Area    ms ms mV mV %  cm m/s m/s mVms  L Median - APB     Wrist APB 4.5 ?4.4 7.2 ?4.0 100  Wrist - APB 7   25.9     Upper arm APB 8.9  6.6  92.8 Upper arm - Wrist 22 50 ?49 24.8  R Median - APB     Wrist APB 4.5 ?4.4 8.8 ?4.0 100 Wrist - APB 7   31.5     Upper arm APB 9.8  8.2  93.2 Upper arm - Wrist 22 41 ?49 34.0  L Ulnar - ADM     Wrist ADM 2.8 ?3.3 8.8 ?6.0 100 Wrist - ADM 7   29.5     B.Elbow ADM 6.8  7.1  80.3 B.Elbow - Wrist 20 50 ?49 26.7     A.Elbow ADM 9.3  7.5  106 A.Elbow - B.Elbow 12 47 ?49 29.1         A.Elbow - Wrist      L Peroneal - EDB     Ankle EDB 4.9 ?6.5 3.3 ?2.0 100 Ankle - EDB 9   13.7     Fib head EDB 12.5  2.8  83.5 Fib head - Ankle 28 37 ?44 12.0     Pop fossa EDB 14.8  2.8  99.9 Pop fossa - Fib head 9 38 ?44 12.4         Pop fossa - Ankle      R Peroneal - EDB     Ankle EDB 4.6 ?6.5 2.2 ?2.0 100 Ankle - EDB 9   7.3     Fib head EDB 12.4  1.8  85.5 Fib head - Ankle 28 36 ?44 6.5     Pop fossa EDB 15.3  1.7  92.6 Pop fossa - Fib head 10 35 ?44 6.2         Pop fossa -  Ankle      L Tibial - AH     Ankle AH 4.6 ?5.8 0.9 ?4.0 100 Ankle - AH 9   3.9     Pop fossa AH NR  NR  NR Pop fossa - Ankle   ?41 NR  R Tibial - AH     Ankle AH 4.7 ?5.8 1.1 ?4.0 100 Ankle - AH 9   3.4     Pop fossa AH NR  NR  NR Pop fossa - Ankle   ?[place]    Nerve / Sites Rec. Site Peak Lat Ref.  Amp Ref. Segments Distance Peak Diff Ref.    ms ms V V  cm ms ms  L Sural - Ankle (Calf)     Calf Ankle NR ?4.4 NR ?6 Calf - Ankle 14    R Sural - Ankle (Calf)     Calf Ankle NR ?4.4 NR ?6 Calf - Ankle 14    L Superficial peroneal - Ankle     Lat leg Ankle NR ?4.4 NR ?6 Lat leg - Ankle 14    R Superficial peroneal - Ankle     Lat leg Ankle NR ?4.4 NR ?6 Lat leg - Ankle 14    L Median, Ulnar - Transcarpal comparison     Median Palm Wrist 2.6 ?2.2 37 ?35 Median Palm - Wrist 8       Ulnar Palm Wrist 2.2 ?2.2 8 ?12 Ulnar Palm - Wrist 8          Median Palm - Ulnar Palm  0.3 ?0.4  R Median, Ulnar - Transcarpal comparison     Median Palm Wrist 3.1 ?2.2 19 ?35  Median Palm - Wrist 8       Ulnar Palm Wrist 2.3 ?2.2 5 ?12 Ulnar Palm - Wrist 8          Median Palm - Ulnar Palm  0.8 ?0.4  L Median - Orthodromic (Dig II, Mid palm)     Dig II Wrist 3.4 ?3.4 11 ?10 Dig II - Wrist 13    R Median - Orthodromic (Dig II, Mid palm)     Dig II Wrist 3.5 ?3.4 10 ?10 Dig II - Wrist 13    L Ulnar - Orthodromic, (Dig V, Mid palm)     Dig V Wrist 2.9 ?3.1 4 ?5 Dig V - Wrist 11    R Ulnar - Orthodromic, (Dig V, Mid palm)     Dig V Wrist 3.2 ?3.1 3 ?5 Dig V - Wrist 7                           F  Wave    Nerve F Lat Ref.   ms ms  L Tibial - AH 61.3 ?56.0  R Tibial - AH 61.8 ?56.0  L Ulnar - ADM 31.4 ?32.0           EMG full       EMG Summary Table    Spontaneous MUAP Recruitment  Muscle IA Fib PSW Fasc Other Amp Dur. Poly Pattern  L. Tibialis anterior Normal None None None _______ Normal Normal Normal Normal  L. Gastrocnemius (Medial head) Normal None None None _______ Normal Normal Normal Normal  L. Vastus lateralis Normal None None None _______ Normal Normal Normal Normal  R. Tibialis anterior Normal None  None None _______ Normal Normal Normal Normal  R. Gastrocnemius (Medial head) Normal None None None _______ Normal Normal Normal Normal  R. Vastus lateralis Normal None None None _______ Normal Normal Normal Normal  R. Lumbar paraspinals (mid) Normal None None None _______ Normal Normal Normal Normal  R. Lumbar paraspinals (low) Normal None None None _______ Normal Normal Normal Normal  L. Lumbar paraspinals (mid) Normal None None None _______ Normal Normal Normal Normal  L. Lumbar paraspinals (low) Normal None None None _______ Normal Normal Normal Normal  R. Abductor hallucis Increased None None None _______ Normal Normal Normal Normal

## 2016-09-02 NOTE — Progress Notes (Signed)
PATIENT: Kristine [name] DOB: 11/08/1945  No chief complaint on file.    HISTORICAL  Kristine [name] [age] years old right-handed female accompanied by her husband, seen in refer by Dr. Doran Durand, Jenny Reichmann, for evaluation of bilateral feet paresthesia, lower extremity swelling, her primary care physician is Dr. Prince Solian, initial evaluation was on Jul 29 2016.  She had a history of hypertension, diabetes since 2002, insulin dependent since 2011, lumbar stenosis, gout, she has a history of atrial fibrillation, required multiple cardial conversion in the past, could not tolerate medicine was potential QT prolongation side effect, such as higher dose of gabapentin. She is on chronic anticoagulation Xarelto, chronic renal disease, she is retired Marine scientist.  She noticed bilateral feet paresthesia since 2014, starting at the plantar surface distal toes, gradually getting up, now feel like a stocking to distal leg level, since January 2018, she also noticed bilateral fingertips paresthesia,  She also complains of neuropathic pain, especially after bearing weight, gradually getting worse, especially since her gout flareup in February 2018, she had significant bilateral toe joints pain, was treated with steroid, with poorly controlled diabetes during that period of time.  For a while, she was taking gabapentin up to 600 mg every night, which has helped her symptoms some, but due to her cardiac arrhythmia, risk for prolonged QT interval, gabapentin was decreased to 100 mg every night, she complains of significant nighttime leg discomfort, urge to move, has to get up 2 AM last night take a shower, she has been treated with amiodarone 200 mg since 2017.  She also complains of low back pain, was diagnosed with lumbar stenosis in the past, now denied radiating pain, has urinary urgency, denies incontinence.  She has obesity, snoring sometimes, significant daytime sleepiness, fatigue, difficulty falling to  sound nighttime sleep. Shortness of breath with minimal exertion  I reviewed the laboratory in February 2018, glucose was elevated 184, creatinine was 1.9, GFR was 26.2, CBC showed hemoglobin of 14 point 1, mild elevated glucose elevated TSH 6.5 in November 2017.   Update September 02 2016: Laboratory evaluation in May 2018 reviewed, normal vitamin D, 48, B12 430, negative RPR, ANA, normal folic acid, mild elevated C reactive protein 14.2,Thyroid functional test, CPK, ESR, elevated A1c 9.4, normal protein electrophoresis, copper level 164,   We have personally reviewed MRI of lumbar spine multilevel degenerative changes, there was no significant foraminal canal stenosis.  EMG nerve conduction study on September 02 2016 showed mild length dependent axonal peripheral neuropathy, no evidence of lumbosacral radiculopathy  She could not tolerate Requip, felt out of body  REVIEW OF SYSTEMS: Full 14 system review of systems performed and notable only for weight gain, fatigue, palpitation, blurred vision, easy bruising, easy bleeding, feeling cold, numbness, insomnia, not enough sleep  ALLERGIES: Allergies  Allergen Reactions  . Albuterol Other (See Comments)    REACTION: Tachycardia- AFib  . Epinephrine Other (See Comments)    Increases heart rate per patient.   . Penicillins Other (See Comments)    REACTION: flushing \T\ hot  . Bee Venom Other (See Comments)    "makes me nervous"  . Ivp Dye [Iodinated Diagnostic Agents] Other (See Comments)    flushing    HOME MEDICATIONS: Current Outpatient Prescriptions  Medication Sig Dispense Refill  . ALPRAZolam (XANAX) 0.25 MG tablet Take 0.125-0.25 mg by mouth 2 (two) times daily as needed for anxiety.   0  . amiodarone (PACERONE) 200 MG tablet Take 1 tablet (200 mg  total) by mouth daily. 30 tablet 11  . BD PEN NEEDLE NANO U/F 32G X 4 MM MISC Inject 1 each as directed See admin instructions. Use pen needles with insulin pens daily  11  . clindamycin  (CLEOCIN) 300 MG capsule TAKE 2 CAPSULES BY MOUTH 1 HOUR PRIOR TO DENTAL APPOINTMENT  0  . colchicine 0.6 MG tablet Take 0.6 mg by mouth daily.    Marland Kitchen diltiazem (CARDIZEM CD) 120 MG 24 hr capsule Take 1 capsule (120 mg total) by mouth daily. 90 capsule 3  . esomeprazole (NEXIUM) 40 MG capsule Take 40 mg by mouth daily as needed (for acid reflux).     . furosemide (LASIX) 80 MG tablet TAKE 1 TABLET (80 MG TOTAL) BY MOUTH 2 (TWO) TIMES DAILY. 180 tablet 3  . HUMALOG KWIKPEN 200 UNIT/ML SOPN Inject 16 Units into the skin 3 (three) times daily with meals.   6  . insulin detemir (LEVEMIR) 100 UNIT/ML injection Inject 66 Units into the skin 2 (two) times daily.     Marland Kitchen KLOR-CON 10 10 MEQ tablet Take 20 mEq by mouth daily.  11  . levothyroxine (SYNTHROID, LEVOTHROID) 25 MCG tablet Take 1 tablet by mouth daily.    . Lidocaine 0.5 % GEL Apply 4 g topically 3 (three) times daily. 170 g 6  . losartan (COZAAR) 100 MG tablet TAKE 1 TABLET BY MOUTH EVERY DAY 90 tablet 3  . metolazone (ZAROXOLYN) 2.5 MG tablet Take 2.5 mg by mouth as directed.    . metoprolol succinate (TOPROL-XL) 50 MG 24 hr tablet Take 1 tablet (50 mg total) by mouth 2 (two) times daily. 270 tablet 3  . Vitamin D, Ergocalciferol, (DRISDOL) 50000 UNITS CAPS Take 50,000 Units by mouth every 7 (seven) days. Takes on Thursday    . XARELTO 15 MG TABS tablet Take 15 mg by mouth daily.     No current facility-administered medications for this visit.     PAST MEDICAL HISTORY: Past Medical History:  Diagnosis Date  . Anxiety   . Asthma   . Atrial fibrillation ([place])   . CHF (congestive heart failure) ([place])   . Chronic bronchitis ([place])   . GERD (gastroesophageal reflux disease)   . High cholesterol   . History of hiatal hernia   . HTN (hypertension)   . Mild aortic sclerosis ([place])   . Morbid obesity ([place])   . Osteoporosis   . Persistent atrial fibrillation ([place])   . Scoliosis   . Spinal stenosis   . Type II diabetes mellitus ([place])   .  Vitamin D deficiency     PAST SURGICAL HISTORY: Past Surgical History:  Procedure Laterality Date  . BLADDER SUSPENSION  1980s  . CARDIAC CATHETERIZATION  11/16/09   SMOOTH AND NORMAL  . CARDIOVERSION N/A 01/07/2015   Procedure: CARDIOVERSION;  Surgeon: Skeet Latch, MD;  Location[facility] ENDOSCOPY;  Service: Cardiovascular;  Laterality: N/A;  . CARDIOVERSION N/A 03/11/2015   Procedure: CARDIOVERSION;  Surgeon: Skeet Latch, MD;  Location: [facility];  Service: Cardiovascular;  Laterality: N/A;  . CARPAL TUNNEL RELEASE Left 1970s  . CATARACT EXTRACTION W/ INTRAOCULAR LENS  IMPLANT, BILATERAL Bilateral ~ 2010  . COLONOSCOPY  08/14/08  . FINGER FRACTURE SURGERY Right 1970s   "ring finger"  . FRACTURE SURGERY    . LAPAROSCOPIC CHOLECYSTECTOMY  1980s  . TUBAL LIGATION    . VAGINAL HYSTERECTOMY  1980    FAMILY HISTORY: Family History  Problem Relation Age of Onset  . Stroke Father   .  Hypertension Father   . Atrial fibrillation Father        HAD MURMUR  . Heart failure Mother   . Congestive Heart Failure Mother   . Hypertension Mother   . Hypertension Brother   . Hypertension Sister   . Hypertension Son   . CAD Sister        EARLY  . CAD Brother        EARLY    SOCIAL HISTORY:  Social History   Social History  . Marital status: Married    Spouse name: N/A  . Number of children: 4  . Years of education: 14   Occupational History  . Not on file.   Social History Main Topics  . Smoking status: Never Smoker  . Smokeless tobacco: Never Used  . Alcohol use No  . Drug use: No  . Sexual activity: Yes   Other Topics Concern  . Not on file   Social History Narrative   Pt lives in [place] with spouse.   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   Right-handed.   1 cup caffeine per day.     PHYSICAL EXAM   There were no vitals filed for this visit.  Not  recorded      There is no height or weight on file to calculate BMI.  PHYSICAL EXAMNIATION:  Gen: NAD, conversant, well nourised, obese, well groomed                     Cardiovascular: Regular rate rhythm, no peripheral edema, warm, nontender. Eyes: Conjunctivae clear without exudates or hemorrhage Neck: Supple, no carotid bruits. Pulmonary: Clear to auscultation bilaterally   NEUROLOGICAL EXAM:  MENTAL STATUS:Obese, tired-looking elderly female Speech:    Speech is normal; fluent and spontaneous with normal comprehension.  Cognition:     Orientation to time, place and person     Normal recent and remote memory     Normal Attention span and concentration     Normal Language, naming, repeating,spontaneous speech     Fund of knowledge   CRANIAL NERVES: CN II: Visual fields are full to confrontation. Fundoscopic exam is normal with sharp discs and no vascular changes. Pupils are round equal and briskly reactive to light. CN III, IV, VI: extraocular movement are normal. No ptosis. CN V: Facial sensation is intact to pinprick in all 3 divisions bilaterally. Corneal responses are intact.  CN VII: Face is symmetric with normal eye closure and smile. CN VIII: Hearing is normal to rubbing fingers CN IX, X: Palate elevates symmetrically. Phonation is normal. CN XI: Head turning and shoulder shrug are intact CN XII: Tongue is midline with normal movements and no atrophy.  MOTOR: There is no pronator drift of out-stretched arms. Muscle bulk and tone are normal. Muscle strength is normal. Bilateral lower extremity pitting edema, skin discoloration  REFLEXES: Reflexes are 2+ and symmetric at the biceps, triceps, knees, and ankles. Plantar responses are flexor.  SENSORY: Length dependent decreased to to light touch, pinprick and vibratory sensation to knee level  COORDINATION: Rapid alternating movements and fine finger movements are intact. There is no dysmetria on finger-to-nose and  heel-knee-shin.    GAIT/STANCE: She needs push up to get up from seated position, wide based, unsteady  Romberg is absent.   DIAGNOSTIC DATA (LABS, IMAGING, TESTING) - I reviewed patient records, labs, notes, testing  and imaging myself where available.   ASSESSMENT AND PLAN  Kristine [name] is a 71 y.o. female   Peripheral neuropathy  She has  factor of diabetes, amiodarone use  Laboratory evaluation showed significantly elevated A1c 9.4,  EMG nerve conduction study on June 2018 has demonstrated list dependent axonal peripheral neuropathy, there is no evidence of lumbosacral radiculopathy.  At risk for developing prolonged QT interval with neuropathic pain medications, will give her lidocaine cream  MRI of lumbar showed multilevel degenerative changes, no significant canal or foraminal stenosis  Restless legs syndrome, chronic insomnia   she could not tolerate Requip 0.5 milligrams, feel she is out of her body  low dose clonazepam 0.5 milligrams 1-2 tablets every night  Obstructive sleep apnea  Refer her to sleep study  Marcial Pacas, M.D. Ph.D.  [facility] [address], [place], [place] [address] Ph: [phone] Fax: [phone]  VI:[id number], Jenny Reichmann, MD Prince Solian, MD

## 2016-09-05 DIAGNOSIS — R69 Illness, unspecified: Secondary | ICD-10-CM | POA: Diagnosis not present

## 2016-09-08 ENCOUNTER — Institutional Professional Consult (permissible substitution): Payer: Medicare HMO | Admitting: Neurology

## 2016-09-18 DIAGNOSIS — R69 Illness, unspecified: Secondary | ICD-10-CM | POA: Diagnosis not present

## 2016-09-22 ENCOUNTER — Encounter (HOSPITAL_COMMUNITY): Payer: Self-pay | Admitting: Nurse Practitioner

## 2016-09-22 ENCOUNTER — Ambulatory Visit (HOSPITAL_COMMUNITY)
Admission: RE | Admit: 2016-09-22 | Discharge: 2016-09-22 | Disposition: A | Discharge status: Home / Self Care | Payer: Medicare HMO | Source: Ambulatory Visit | Attending: Nurse Practitioner | Admitting: Nurse Practitioner

## 2016-09-22 DIAGNOSIS — M419 Scoliosis, unspecified: Secondary | ICD-10-CM | POA: Insufficient documentation

## 2016-09-22 DIAGNOSIS — R0602 Shortness of breath: Secondary | ICD-10-CM | POA: Insufficient documentation

## 2016-09-22 DIAGNOSIS — E1122 Type 2 diabetes mellitus with diabetic chronic kidney disease: Secondary | ICD-10-CM | POA: Insufficient documentation

## 2016-09-22 DIAGNOSIS — E559 Vitamin D deficiency, unspecified: Secondary | ICD-10-CM | POA: Diagnosis not present

## 2016-09-22 DIAGNOSIS — Z880 Allergy status to penicillin: Secondary | ICD-10-CM | POA: Diagnosis not present

## 2016-09-22 DIAGNOSIS — Z7901 Long term (current) use of anticoagulants: Secondary | ICD-10-CM | POA: Insufficient documentation

## 2016-09-22 DIAGNOSIS — Z8249 Family history of ischemic heart disease and other diseases of the circulatory system: Secondary | ICD-10-CM | POA: Diagnosis not present

## 2016-09-22 DIAGNOSIS — Z9889 Other specified postprocedural states: Secondary | ICD-10-CM | POA: Diagnosis not present

## 2016-09-22 DIAGNOSIS — E039 Hypothyroidism, unspecified: Secondary | ICD-10-CM | POA: Insufficient documentation

## 2016-09-22 DIAGNOSIS — N184 Chronic kidney disease, stage 4 (severe): Secondary | ICD-10-CM | POA: Diagnosis not present

## 2016-09-22 DIAGNOSIS — Z888 Allergy status to other drugs, medicaments and biological substances status: Secondary | ICD-10-CM | POA: Diagnosis not present

## 2016-09-22 DIAGNOSIS — F419 Anxiety disorder, unspecified: Secondary | ICD-10-CM | POA: Insufficient documentation

## 2016-09-22 DIAGNOSIS — E8770 Fluid overload, unspecified: Secondary | ICD-10-CM | POA: Diagnosis not present

## 2016-09-22 DIAGNOSIS — K449 Diaphragmatic hernia without obstruction or gangrene: Secondary | ICD-10-CM | POA: Insufficient documentation

## 2016-09-22 DIAGNOSIS — N189 Chronic kidney disease, unspecified: Secondary | ICD-10-CM | POA: Insufficient documentation

## 2016-09-22 DIAGNOSIS — I130 Hypertensive heart and chronic kidney disease with heart failure and stage 1 through stage 4 chronic kidney disease, or unspecified chronic kidney disease: Secondary | ICD-10-CM | POA: Diagnosis not present

## 2016-09-22 DIAGNOSIS — I129 Hypertensive chronic kidney disease with stage 1 through stage 4 chronic kidney disease, or unspecified chronic kidney disease: Secondary | ICD-10-CM | POA: Diagnosis not present

## 2016-09-22 DIAGNOSIS — I481 Persistent atrial fibrillation: Secondary | ICD-10-CM | POA: Diagnosis not present

## 2016-09-22 DIAGNOSIS — J45909 Unspecified asthma, uncomplicated: Secondary | ICD-10-CM | POA: Diagnosis not present

## 2016-09-22 DIAGNOSIS — Z6841 Body Mass Index (BMI) 40.0 and over, adult: Secondary | ICD-10-CM | POA: Diagnosis not present

## 2016-09-22 DIAGNOSIS — K219 Gastro-esophageal reflux disease without esophagitis: Secondary | ICD-10-CM | POA: Insufficient documentation

## 2016-09-22 DIAGNOSIS — Z79899 Other long term (current) drug therapy: Secondary | ICD-10-CM | POA: Diagnosis not present

## 2016-09-22 DIAGNOSIS — Z794 Long term (current) use of insulin: Secondary | ICD-10-CM | POA: Diagnosis not present

## 2016-09-22 DIAGNOSIS — E119 Type 2 diabetes mellitus without complications: Secondary | ICD-10-CM | POA: Insufficient documentation

## 2016-09-22 DIAGNOSIS — E7800 Pure hypercholesterolemia, unspecified: Secondary | ICD-10-CM | POA: Diagnosis not present

## 2016-09-22 DIAGNOSIS — I5030 Unspecified diastolic (congestive) heart failure: Secondary | ICD-10-CM | POA: Diagnosis not present

## 2016-09-22 DIAGNOSIS — I4819 Other persistent atrial fibrillation: Secondary | ICD-10-CM

## 2016-09-22 DIAGNOSIS — R69 Illness, unspecified: Secondary | ICD-10-CM | POA: Diagnosis not present

## 2016-09-22 MED ORDER — METOPROLOL SUCCINATE ER 50 MG PO TB24: ORAL_TABLET | ORAL | 3 refills | Status: DC

## 2016-09-22 NOTE — Progress Notes (Signed)
Patient ID: JANIAH DEVINNEY, female   DOB: [birth year], 71 y.o.   MRN: [id number]     Primary Care Physician: Prince Solian, MD Referring Physician: Dr. Rayann Heman Cardiologist: Dr. Jettie Booze LETHER TESCH is a 71 y.o. female with a h/o DM, CKD, obesity, HTN,HF, persistent afib, failing flecainide,tikosyn due to prolonged qt and currently on amiodarone. She was in the afib clinic 5/17, for increasing numbness of her feet and fingertips. 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 She had been in afib x 3 weeks since her brother died. Amiodarone was decreased to 100 mg  M,W,F last fall.  She saw Dr.Allred  f/u 6/4 and was told that she was not a candidate for ablation, tikosyn or sotalol  for concerns of long qtc. He gave options of increasing amiodarone with potential cardioversion, leaving amiodarone at 100 mg or rate control. She chose to increase amiodarone back to 200 mg a day.  In the afib clinic, 7/3, she remains in afib. However, she does not want a cardioversion. She decided that she would prefer to come off amiodarone and be rate controlled. She is tolerating afib much better than she did when it first started.She is concerned with the potential side effects as well.  Today, she denies symptoms of orthopnea, PND, lower extremity edema, dizziness, presyncope, syncope, or neurologic sequela. Positive for shortness of breath,improved but ongoing.The patient is tolerating medications without difficulties and is otherwise without complaint today.   Past Medical History:  Diagnosis Date  . Anxiety   . Asthma   . Atrial fibrillation ([place])   . CHF (congestive heart failure) ([place])   . Chronic bronchitis ([place])   . GERD (gastroesophageal reflux disease)   . High cholesterol   . History of hiatal hernia   . HTN (hypertension)   . 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 Morbid obesity ([place])   . Osteoporosis   . Persistent atrial  fibrillation ([place])   . Scoliosis   . Spinal stenosis   . Type II diabetes mellitus ([place])   . Vitamin D deficiency    Past Surgical History:  Procedure Laterality Date  . BLADDER SUSPENSION  1980s  . CARDIAC CATHETERIZATION  11/16/09   SMOOTH AND NORMAL  . CARDIOVERSION N/A 01/07/2015   Procedure: CARDIOVERSION;  Surgeon: Skeet Latch, MD;  Location: [facility] ENDOSCOPY;  Service: Cardiovascular;  Laterality: N/A;  . CARDIOVERSION N/A 03/11/2015   Procedure: CARDIOVERSION;  Surgeon: Skeet Latch, MD;  Location: [place];  Service: Cardiovascular;  Laterality: N/A;  . CARPAL TUNNEL RELEASE Left 1970s  . CATARACT EXTRACTION W/ INTRAOCULAR LENS  IMPLANT, BILATERAL Bilateral ~ 2010  . COLONOSCOPY  08/14/08  . FINGER FRACTURE SURGERY Right 1970s   "ring finger"  . FRACTURE SURGERY    . LAPAROSCOPIC CHOLECYSTECTOMY  1980s  . TUBAL LIGATION    . VAGINAL HYSTERECTOMY  1980    Current Outpatient Prescriptions  Medication Sig Dispense Refill  . ALPRAZolam (XANAX) 0.25 MG tablet Take 0.125-0.25 mg by mouth 2 (two) times daily as needed for anxiety.   0  . BD PEN NEEDLE NANO U/F 32G X 4 MM MISC Inject 1 each as directed See admin instructions. Use pen needles with insulin pens daily  11  . clindamycin (CLEOCIN) 300 MG capsule TAKE 2 CAPSULES BY MOUTH 1 HOUR PRIOR TO DENTAL APPOINTMENT  0  . clonazePAM (KLONOPIN) 0.5 MG tablet Take 2 tablets (1 mg total) by mouth  at bedtime. 1- 2tabs as needed for sleep 60 tablet 3  . colchicine 0.6 MG tablet Take 0.6 mg by mouth daily.    Marland Kitchen esomeprazole (NEXIUM) 40 MG capsule Take 40 mg by mouth daily as needed (for acid reflux).     . furosemide (LASIX) 80 MG tablet TAKE 1 TABLET (80 MG TOTAL) BY MOUTH 2 (TWO) TIMES DAILY. 180 tablet 3  . HUMALOG KWIKPEN 200 UNIT/ML SOPN Inject 16 Units into the skin 3 (three) times daily with meals.   6  . insulin detemir (LEVEMIR) 100 UNIT/ML injection Inject 66 Units into the skin 2 (two) times daily.     Marland Kitchen KLOR-CON 10  10 MEQ tablet Take 20 mEq by mouth daily.  11  . levothyroxine (SYNTHROID, LEVOTHROID) 25 MCG tablet Take 1 tablet by mouth daily.    . Lidocaine 0.5 % GEL Apply 4 g topically 3 (three) times daily. 170 g 6  . losartan (COZAAR) 100 MG tablet TAKE 1 TABLET BY MOUTH EVERY DAY 90 tablet 3  . metolazone (ZAROXOLYN) 2.5 MG tablet Take 2.5 mg by mouth as directed.    . metoprolol succinate (TOPROL-XL) 50 MG 24 hr tablet Take 1 and 1/2 tablet (75mg ) by mouth twice a day 90 tablet 3  . Vitamin D, Ergocalciferol, (DRISDOL) 50000 UNITS CAPS Take 50,000 Units by mouth every 7 (seven) days. Takes on Thursday    . XARELTO 15 MG TABS tablet Take 15 mg by mouth daily.     No current facility-administered medications for this encounter.     Allergies  Allergen Reactions  . Albuterol Other (See Comments)    REACTION: Tachycardia- AFib  . Epinephrine Other (See Comments)    Increases heart rate per patient.   . Penicillins Other (See Comments)    REACTION: flushing \\T \ hot  . Bee Venom Other (See Comments)    "makes me nervous"  . Ivp Dye [Iodinated Diagnostic Agents] Other (See Comments)    flushing    Social History   Social History  . Marital status: Married    Spouse name: N/A  . Number of children: 4  . Years of education: 14   Occupational History  . Not on file.   Social History Main Topics  . Smoking status: Never Smoker  . Smokeless tobacco: Never Used  . Alcohol use No  . Drug use: No  . Sexual activity: Yes   Other Topics Concern  . Not on file   Social History Narrative   Pt lives in [place] with spouse.   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   Right-handed.   1 cup caffeine per day.    Family History  Problem Relation Age of Onset  . Stroke Father   . Hypertension Father   . Atrial fibrillation Father        HAD MURMUR  . Heart failure Mother   . Congestive  Heart Failure Mother   . Hypertension Mother   . Hypertension Brother   . Hypertension Sister   . Hypertension Son   . CAD Sister        EARLY  . CAD Brother        EARLY    ROS- All systems are reviewed and negative except as per the HPI above  Physical Exam: Vitals:   09/22/16 0922  BP: 126/82  Pulse: (!) 103  Weight:  267 lb 9.6 oz (121.4 kg)  Height: 5' 8.5" (1.74 m)    GEN- The patient is well appearing, alert and oriented x 3 today.   Head- normocephalic, atraumatic Eyes-  Sclera clear, conjunctiva pink Ears- hearing intact Oropharynx- clear Neck- supple, no JVP Lymph- no cervical lymphadenopathy Lungs- Clear to ausculation bilaterally, normal work of breathing Heart- irregular rate and rhythm, no murmurs, rubs or gallops, PMI not laterally displaced GI- soft, NT, ND, + BS Extremities- no clubbing, cyanosis, or edema MS- no significant deformity or atrophy Skin- no rash or lesion Psych- euthymic mood, full affect Neuro- strength and sensation are intact  EKG-  Afib at 103 bpm, qrs int 100 ms, qtc 552 ms Epic records reviewed    Assessment and Plan: 1. Afib  Persistent rate controlled afib despite increase in amiodarone doe but does not want cardioversion to see if SR could be restored Stop amiodarone Will plan on rate control, continue metoprolol but increase to 75 mg bid Xarelto 15 mg currently at PCP's adjustment due to concerns of renal function but I get crcl cal at 52.14 ml/min based on creatinine of 2.02, by guidelines she should be at 20 mg daily.  2.Chronic shortness of breath  Recently evaluated by Pulmonary, no amiodarone toxcitiy found  Felt dyspnea is mutltifactorial  3. Diastolic heart failure  Lasix 80 mg bid Limit fluids to no more than 6 glasses a day as per nutritionist Continue daily weights  4. HTN  Controlled  5. Hypothyroidism On synthroid  Per pcp  5. Obesity Struggles with weight loss  6. DM Poorly managed with HGB A1c  at 9.4   F/u in one month for further look at rate control off amiodarone and if BB needs to be up titrated  Butch Penny C. Bridgett Hattabaugh, [facility] [address] [place], [place] [address] [phone]

## 2016-09-22 NOTE — Patient Instructions (Signed)
Your physician has recommended you make the following change in your medication:  1)Stop Amiodarone 2)Increase metoprolol to 75mg  twice a day (1 and 1/2 tablets of your 50mg  tablets twice a day)

## 2016-09-28 ENCOUNTER — Institutional Professional Consult (permissible substitution): Payer: Medicare HMO | Admitting: Neurology

## 2016-09-30 ENCOUNTER — Institutional Professional Consult (permissible substitution): Payer: Medicare HMO | Admitting: Neurology

## 2016-10-14 DIAGNOSIS — Z6841 Body Mass Index (BMI) 40.0 and over, adult: Secondary | ICD-10-CM | POA: Diagnosis not present

## 2016-10-14 DIAGNOSIS — N183 Chronic kidney disease, stage 3 (moderate): Secondary | ICD-10-CM | POA: Diagnosis not present

## 2016-10-14 DIAGNOSIS — E1165 Type 2 diabetes mellitus with hyperglycemia: Secondary | ICD-10-CM | POA: Diagnosis not present

## 2016-10-14 DIAGNOSIS — R69 Illness, unspecified: Secondary | ICD-10-CM | POA: Diagnosis not present

## 2016-10-14 DIAGNOSIS — N184 Chronic kidney disease, stage 4 (severe): Secondary | ICD-10-CM | POA: Diagnosis not present

## 2016-10-14 DIAGNOSIS — I10 Essential (primary) hypertension: Secondary | ICD-10-CM | POA: Diagnosis not present

## 2016-10-16 DIAGNOSIS — R69 Illness, unspecified: Secondary | ICD-10-CM | POA: Diagnosis not present

## 2016-10-19 DIAGNOSIS — N644 Mastodynia: Secondary | ICD-10-CM | POA: Diagnosis not present

## 2016-10-19 DIAGNOSIS — R300 Dysuria: Secondary | ICD-10-CM | POA: Diagnosis not present

## 2016-10-20 ENCOUNTER — Encounter (HOSPITAL_COMMUNITY): Payer: Self-pay | Admitting: Nurse Practitioner

## 2016-10-20 ENCOUNTER — Ambulatory Visit (HOSPITAL_COMMUNITY)
Admission: RE | Admit: 2016-10-20 | Discharge: 2016-10-20 | Disposition: A | Discharge status: Home / Self Care | Payer: Medicare HMO | Source: Ambulatory Visit | Attending: Nurse Practitioner | Admitting: Nurse Practitioner

## 2016-10-20 VITALS — BP 136/84 | HR 85 | Ht 68.5 in | Wt 272.6 lb

## 2016-10-20 DIAGNOSIS — I482 Chronic atrial fibrillation, unspecified: Secondary | ICD-10-CM

## 2016-10-20 DIAGNOSIS — I4510 Unspecified right bundle-branch block: Secondary | ICD-10-CM | POA: Insufficient documentation

## 2016-10-20 DIAGNOSIS — I4891 Unspecified atrial fibrillation: Secondary | ICD-10-CM | POA: Diagnosis present

## 2016-10-20 NOTE — Progress Notes (Signed)
Patient ID: Kristine [name], female   DOB: 1945-05-17, 71 y.o.   MRN: [id number]     Primary Care Physician: Prince Solian, MD Referring Physician: Dr. Rayann Heman Cardiologist: Dr. Jettie Booze Kristine [name] is a 71 y.o. female with a h/o DM, CKD, obesity, HTN,HF, persistent afib, failing flecainide,tikosyn due to prolonged qt and currently on amiodarone. She was in the afib clinic 5/17, for increasing numbness of her feet and fingertips. 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 She had been in afib x 3 weeks since her brother died. Amiodarone was decreased to 100 mg  M,W,F last fall.  She saw Dr.Allred  f/u 6/4 and was told that she was not a candidate for ablation, tikosyn or sotalol  for concerns of long qtc. He gave options of increasing amiodarone with potential cardioversion, leaving amiodarone at 100 mg or rate control. She chose to increase amiodarone back to 200 mg a day.  In the afib clinic, 7/3, she remains in afib. However, she does not want a cardioversion. She decided that she would prefer to come off amiodarone and be rate controlled. She is tolerating afib much better than she did when it first started.She is concerned with the potential side effects as well.  F/u in afib clinic, 7/31. She has been off amiodarone x one month and feels well. She is tolerating rate controlled afib in the 80's. LEE is stable. She is watching her salt and water intake. She is excited to be palnning for her 24TH wedding anniversary in the fall.   Today, she denies symptoms of orthopnea, PND, lower extremity edema, dizziness, presyncope, syncope, or neurologic sequela. Positive for shortness of breath,improved but ongoing.The patient is tolerating medications without difficulties and is otherwise without complaint today.   Past Medical History:  Diagnosis Date  . Anxiety   . Asthma   . Atrial fibrillation ([place])   . CHF (congestive  heart failure) ([place])   . Chronic bronchitis ([place])   . GERD (gastroesophageal reflux disease)   . High cholesterol   . History of hiatal hernia   . HTN (hypertension)   . Mild aortic sclerosis ([place])   . Morbid obesity ([place])   . Osteoporosis   . Persistent atrial fibrillation ([place])   . Scoliosis   . Spinal stenosis   . Type II diabetes mellitus ([place])   . Vitamin D deficiency    Past Surgical History:  Procedure Laterality Date  . BLADDER SUSPENSION  1980s  . CARDIAC CATHETERIZATION  11/16/09   SMOOTH AND NORMAL  . CARDIOVERSION N/A 01/07/2015   Procedure: CARDIOVERSION;  Surgeon: Skeet Latch, MD;  Location: [facility] ENDOSCOPY;  Service: Cardiovascular;  Laterality: N/A;  . CARDIOVERSION N/A 03/11/2015   Procedure: CARDIOVERSION;  Surgeon: Skeet Latch, MD;  Location: [place];  Service: Cardiovascular;  Laterality: N/A;  . CARPAL TUNNEL RELEASE Left 1970s  . CATARACT EXTRACTION W/ INTRAOCULAR LENS  IMPLANT, BILATERAL Bilateral ~ 2010  . COLONOSCOPY  08/14/08  . FINGER FRACTURE SURGERY Right 1970s   "ring finger"  . FRACTURE SURGERY    . LAPAROSCOPIC CHOLECYSTECTOMY  1980s  . TUBAL LIGATION    . VAGINAL HYSTERECTOMY  1980    Current Outpatient Prescriptions  Medication Sig Dispense Refill  . ALPRAZolam (XANAX) 0.25 MG tablet Take 0.125-0.25 mg by mouth 2 (two) times daily as needed for anxiety.   0  . BD PEN NEEDLE NANO U/F 32G X 4 MM MISC  Inject 1 each as directed See admin instructions. Use pen needles with insulin pens daily  11  . clindamycin (CLEOCIN) 300 MG capsule TAKE 2 CAPSULES BY MOUTH 1 HOUR PRIOR TO DENTAL APPOINTMENT  0  . clonazePAM (KLONOPIN) 0.5 MG tablet Take 2 tablets (1 mg total) by mouth at bedtime. 1- 2tabs as needed for sleep 60 tablet 3  . colchicine 0.6 MG tablet Take 0.6 mg by mouth daily.    Marland Kitchen esomeprazole (NEXIUM) 40 MG capsule Take 40 mg by mouth daily as needed (for acid reflux).     . furosemide (LASIX) 80 MG tablet TAKE 1 TABLET (80 MG  TOTAL) BY MOUTH 2 (TWO) TIMES DAILY. 180 tablet 3  . HUMALOG KWIKPEN 200 UNIT/ML SOPN Inject 26 Units into the skin 3 (three) times daily with meals.   6  . insulin detemir (LEVEMIR) 100 UNIT/ML injection Inject 70 Units into the skin 2 (two) times daily.     Marland Kitchen KLOR-CON 10 10 MEQ tablet Take 20 mEq by mouth daily.  11  . levothyroxine (SYNTHROID, LEVOTHROID) 25 MCG tablet Take 1 tablet by mouth daily.    . Lidocaine 0.5 % GEL Apply 4 g topically 3 (three) times daily. 170 g 6  . losartan (COZAAR) 100 MG tablet TAKE 1 TABLET BY MOUTH EVERY DAY 90 tablet 3  . metolazone (ZAROXOLYN) 2.5 MG tablet Take 2.5 mg by mouth as directed.    . metoprolol succinate (TOPROL-XL) 50 MG 24 hr tablet Take 1 and 1/2 tablet (75mg ) by mouth twice a day 90 tablet 3  . Vitamin D, Ergocalciferol, (DRISDOL) 50000 UNITS CAPS Take 50,000 Units by mouth every 7 (seven) days. Takes on Thursday    . XARELTO 15 MG TABS tablet Take 15 mg by mouth daily.     No current facility-administered medications for this encounter.     Allergies  Allergen Reactions  . Albuterol Other (See Comments)    REACTION: Tachycardia- AFib  . Epinephrine Other (See Comments)    Increases heart rate per patient.   . Penicillins Other (See Comments)    REACTION: flushing \\T \ hot  . Bee Venom Other (See Comments)    "makes me nervous"  . Ivp Dye [Iodinated Diagnostic Agents] Other (See Comments)    flushing    Social History   Social History  . Marital status: Married    Spouse name: N/A  . Number of children: 4  . Years of education: 14   Occupational History  . Not on file.   Social History Main Topics  . Smoking status: Never Smoker  . Smokeless tobacco: Never Used  . Alcohol use No  . Drug use: No  . Sexual activity: Yes   Other Topics Concern  . Not on file   Social History Narrative   Pt lives in [place] with spouse.   Retired Engineer, production.  RN.   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   Right-handed.   1 cup caffeine per day.    Family History  Problem Relation Age of Onset  . Stroke Father   . Hypertension Father   . Atrial fibrillation Father        HAD MURMUR  . Heart failure Mother   . Congestive Heart Failure Mother   . Hypertension Mother   . Hypertension Brother   . Hypertension Sister   . Hypertension Son   . CAD Sister  EARLY  . CAD Brother        EARLY    ROS- All systems are reviewed and negative except as per the HPI above  Physical Exam: Vitals:   10/20/16 0836  BP: 136/84  Pulse: 85  Weight: 272 lb 9.6 oz (123.7 kg)  Height: 5' 8.5" (1.74 m)    GEN- The patient is well appearing, alert and oriented x 3 today.   Head- normocephalic, atraumatic Eyes-  Sclera clear, conjunctiva pink Ears- hearing intact Oropharynx- clear Neck- supple, no JVP Lymph- no cervical lymphadenopathy Lungs- Clear to ausculation bilaterally, normal work of breathing Heart- irregular rate and rhythm, no murmurs, rubs or gallops, PMI not laterally displaced GI- soft, NT, ND, + BS Extremities- no clubbing, cyanosis, or edema MS- no significant deformity or atrophy Skin- no rash or lesion Psych- euthymic mood, full affect Neuro- strength and sensation are intact  EKG-  Afib at 85 bpm, qrs int 96 ms, qtc 521 ms Epic records reviewed    Assessment and Plan: 1. Afib  Persistent rate controlled afib, chose to stop amiodarone now x one month Continue metoprolol at 75 mg bid Xarelto 15 mg currently at PCP's adjustment due to concerns of renal function  2.Chronic shortness of breath  Recently evaluated by Pulmonary, no amiodarone toxcitiy found  Felt dyspnea is multifactorial Stable  3. Diastolic heart failure  Lasix 80 mg bid Limit fluids to no more than 6 glasses a day as per nutritionist Continue daily weights Stable   4. HTN  Controlled  5. Hypothyroidism On synthroid  Per pcp  5.  Obesity Struggles with weight loss  6. DM Poorly managed with HGB A1c at 9.4   F/u with Dr. Acie Fredrickson as scheduled  Kristine [name], [facility] [address] [place], [place] [address] [phone]

## 2016-10-26 DIAGNOSIS — E1165 Type 2 diabetes mellitus with hyperglycemia: Secondary | ICD-10-CM | POA: Diagnosis not present

## 2016-10-26 NOTE — Addendum Note (Signed)
Encounter addended by: Sherran Needs, NP on: 10/26/2016  8:41 AM<BR>    Actions taken: LOS modified

## 2016-10-27 ENCOUNTER — Ambulatory Visit (HOSPITAL_COMMUNITY): Payer: Medicare HMO | Admitting: Nurse Practitioner

## 2016-10-29 DIAGNOSIS — R69 Illness, unspecified: Secondary | ICD-10-CM | POA: Diagnosis not present

## 2016-11-03 DIAGNOSIS — L84 Corns and callosities: Secondary | ICD-10-CM | POA: Diagnosis not present

## 2016-11-03 DIAGNOSIS — M79676 Pain in unspecified toe(s): Secondary | ICD-10-CM | POA: Diagnosis not present

## 2016-11-03 DIAGNOSIS — B351 Tinea unguium: Secondary | ICD-10-CM | POA: Diagnosis not present

## 2016-11-03 DIAGNOSIS — E1142 Type 2 diabetes mellitus with diabetic polyneuropathy: Secondary | ICD-10-CM | POA: Diagnosis not present

## 2016-11-22 DIAGNOSIS — R69 Illness, unspecified: Secondary | ICD-10-CM | POA: Diagnosis not present

## 2016-11-25 DIAGNOSIS — Z124 Encounter for screening for malignant neoplasm of cervix: Secondary | ICD-10-CM | POA: Diagnosis not present

## 2016-11-25 DIAGNOSIS — L439 Lichen planus, unspecified: Secondary | ICD-10-CM | POA: Diagnosis not present

## 2016-11-25 DIAGNOSIS — N369 Urethral disorder, unspecified: Secondary | ICD-10-CM | POA: Diagnosis not present

## 2016-11-25 DIAGNOSIS — Z01419 Encounter for gynecological examination (general) (routine) without abnormal findings: Secondary | ICD-10-CM | POA: Diagnosis not present

## 2016-11-30 DIAGNOSIS — L821 Other seborrheic keratosis: Secondary | ICD-10-CM | POA: Diagnosis not present

## 2016-11-30 DIAGNOSIS — L439 Lichen planus, unspecified: Secondary | ICD-10-CM | POA: Diagnosis not present

## 2016-11-30 DIAGNOSIS — C44321 Squamous cell carcinoma of skin of nose: Secondary | ICD-10-CM | POA: Diagnosis not present

## 2016-11-30 DIAGNOSIS — D225 Melanocytic nevi of trunk: Secondary | ICD-10-CM | POA: Diagnosis not present

## 2016-11-30 DIAGNOSIS — D485 Neoplasm of uncertain behavior of skin: Secondary | ICD-10-CM | POA: Diagnosis not present

## 2016-11-30 DIAGNOSIS — D1801 Hemangioma of skin and subcutaneous tissue: Secondary | ICD-10-CM | POA: Diagnosis not present

## 2016-11-30 DIAGNOSIS — L218 Other seborrheic dermatitis: Secondary | ICD-10-CM | POA: Diagnosis not present

## 2016-11-30 DIAGNOSIS — L814 Other melanin hyperpigmentation: Secondary | ICD-10-CM | POA: Diagnosis not present

## 2016-12-01 DIAGNOSIS — R69 Illness, unspecified: Secondary | ICD-10-CM | POA: Diagnosis not present

## 2016-12-07 ENCOUNTER — Telehealth: Payer: Self-pay | Admitting: Neurology

## 2016-12-07 NOTE — Telephone Encounter (Signed)
Pt cancelled appt for 09/30/16. I called pt on 10/08/16 pt asked to be called back. I lvm for pt to call back to reschedule sleep consult on 11/24/16. I called pt on 12/02/16 and pt declined rescheduling sleep consult.

## 2016-12-09 IMAGING — NM NM MISC PROCEDURE
6 series · 36 of 36 positions shown · non-contrast
Comparison: none

[Series 1: wbr_s-proj_st stress · 6.51mm/px · 6 of 512 frames shown (1 of 2)]
[frame 43/512]
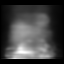
[frame 128/512]
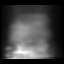
[frame 214/512]
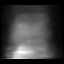
[frame 299/512]
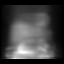
[frame 384/512]
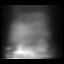
[frame 470/512]
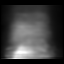

[Series 1: stress · 6.51mm/px · 6 of 457 frames shown (1 of 2)]
[frame 39/457  full-range]
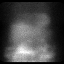
[frame 115/457  full-range]
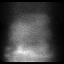
[frame 191/457  full-range]
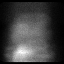
[frame 267/457  full-range]
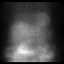
[frame 343/457  full-range]
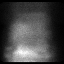
[frame 419/457  full-range]
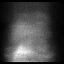

[Series 1: stress · 6.51mm/px · 6 of 64 frames shown (2 of 2)]
[frame 6/64]
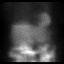
[frame 16/64]
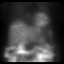
[frame 27/64]
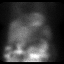
[frame 38/64]
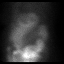
[frame 48/64]
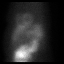
[frame 59/64]
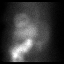

[Series 1: wbr_s-proj_st stress · 6.51mm/px · 6 of 64 frames shown (2 of 2)]
[frame 6/64]
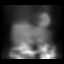
[frame 16/64]
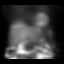
[frame 27/64]
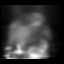
[frame 38/64]
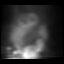
[frame 48/64]
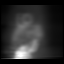
[frame 59/64]
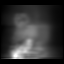

[Series 2: rest · 6.51mm/px · 6 of 64 frames shown]
[frame 6/64]
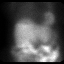
[frame 16/64]
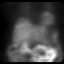
[frame 27/64]
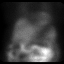
[frame 38/64]
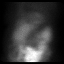
[frame 48/64]
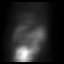
[frame 59/64]
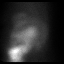

[Series 2: wbr_r-proj_st rest · 6.51mm/px · 6 of 64 frames shown]
[frame 6/64]
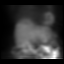
[frame 16/64]
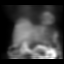
[frame 27/64]
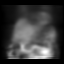
[frame 38/64]
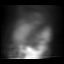
[frame 48/64]
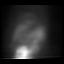
[frame 59/64]
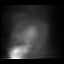

[36 of 36 positions shown; findings below may reference images not displayed]

Canned report from images found in remote index.

Refer to host system for actual result text.

## 2016-12-17 DIAGNOSIS — R69 Illness, unspecified: Secondary | ICD-10-CM | POA: Diagnosis not present

## 2016-12-18 DIAGNOSIS — N369 Urethral disorder, unspecified: Secondary | ICD-10-CM | POA: Diagnosis not present

## 2016-12-18 DIAGNOSIS — N952 Postmenopausal atrophic vaginitis: Secondary | ICD-10-CM | POA: Diagnosis not present

## 2016-12-24 DIAGNOSIS — Z6841 Body Mass Index (BMI) 40.0 and over, adult: Secondary | ICD-10-CM | POA: Diagnosis not present

## 2016-12-24 DIAGNOSIS — E1140 Type 2 diabetes mellitus with diabetic neuropathy, unspecified: Secondary | ICD-10-CM | POA: Diagnosis not present

## 2016-12-24 DIAGNOSIS — M10071 Idiopathic gout, right ankle and foot: Secondary | ICD-10-CM | POA: Diagnosis not present

## 2016-12-24 DIAGNOSIS — K921 Melena: Secondary | ICD-10-CM | POA: Diagnosis not present

## 2016-12-24 DIAGNOSIS — E1165 Type 2 diabetes mellitus with hyperglycemia: Secondary | ICD-10-CM | POA: Diagnosis not present

## 2017-01-05 ENCOUNTER — Telehealth: Payer: Self-pay | Admitting: *Deleted

## 2017-01-05 ENCOUNTER — Ambulatory Visit: Payer: Medicare HMO | Admitting: Neurology

## 2017-01-05 NOTE — Telephone Encounter (Signed)
No showed follow up appointment. 

## 2017-01-06 ENCOUNTER — Encounter: Payer: Self-pay | Admitting: Neurology

## 2017-01-07 DIAGNOSIS — C44321 Squamous cell carcinoma of skin of nose: Secondary | ICD-10-CM | POA: Diagnosis not present

## 2017-01-21 DIAGNOSIS — R69 Illness, unspecified: Secondary | ICD-10-CM | POA: Diagnosis not present

## 2017-01-21 DIAGNOSIS — E1165 Type 2 diabetes mellitus with hyperglycemia: Secondary | ICD-10-CM | POA: Diagnosis not present

## 2017-01-21 DIAGNOSIS — I129 Hypertensive chronic kidney disease with stage 1 through stage 4 chronic kidney disease, or unspecified chronic kidney disease: Secondary | ICD-10-CM | POA: Diagnosis not present

## 2017-01-21 DIAGNOSIS — N184 Chronic kidney disease, stage 4 (severe): Secondary | ICD-10-CM | POA: Diagnosis not present

## 2017-01-21 DIAGNOSIS — I480 Paroxysmal atrial fibrillation: Secondary | ICD-10-CM | POA: Diagnosis not present

## 2017-01-21 DIAGNOSIS — Z6841 Body Mass Index (BMI) 40.0 and over, adult: Secondary | ICD-10-CM | POA: Diagnosis not present

## 2017-01-21 DIAGNOSIS — M10071 Idiopathic gout, right ankle and foot: Secondary | ICD-10-CM | POA: Diagnosis not present

## 2017-01-22 DIAGNOSIS — R69 Illness, unspecified: Secondary | ICD-10-CM | POA: Diagnosis not present

## 2017-01-25 DIAGNOSIS — Z6841 Body Mass Index (BMI) 40.0 and over, adult: Secondary | ICD-10-CM | POA: Diagnosis not present

## 2017-01-25 DIAGNOSIS — G4700 Insomnia, unspecified: Secondary | ICD-10-CM | POA: Diagnosis not present

## 2017-01-25 DIAGNOSIS — M10071 Idiopathic gout, right ankle and foot: Secondary | ICD-10-CM | POA: Diagnosis not present

## 2017-01-25 DIAGNOSIS — E1165 Type 2 diabetes mellitus with hyperglycemia: Secondary | ICD-10-CM | POA: Diagnosis not present

## 2017-01-25 DIAGNOSIS — E1140 Type 2 diabetes mellitus with diabetic neuropathy, unspecified: Secondary | ICD-10-CM | POA: Diagnosis not present

## 2017-01-28 DIAGNOSIS — E113293 Type 2 diabetes mellitus with mild nonproliferative diabetic retinopathy without macular edema, bilateral: Secondary | ICD-10-CM | POA: Diagnosis not present

## 2017-01-28 DIAGNOSIS — H3563 Retinal hemorrhage, bilateral: Secondary | ICD-10-CM | POA: Diagnosis not present

## 2017-01-28 DIAGNOSIS — Z961 Presence of intraocular lens: Secondary | ICD-10-CM | POA: Diagnosis not present

## 2017-01-28 DIAGNOSIS — H04123 Dry eye syndrome of bilateral lacrimal glands: Secondary | ICD-10-CM | POA: Diagnosis not present

## 2017-02-09 ENCOUNTER — Encounter: Payer: Self-pay | Admitting: Neurology

## 2017-02-09 ENCOUNTER — Ambulatory Visit: Payer: Medicare HMO | Admitting: Neurology

## 2017-02-09 ENCOUNTER — Encounter (INDEPENDENT_AMBULATORY_CARE_PROVIDER_SITE_OTHER): Payer: Self-pay

## 2017-02-09 VITALS — BP 115/74 | HR 85 | Ht 68.0 in | Wt 279.0 lb

## 2017-02-09 DIAGNOSIS — R269 Unspecified abnormalities of gait and mobility: Secondary | ICD-10-CM

## 2017-02-09 DIAGNOSIS — E1142 Type 2 diabetes mellitus with diabetic polyneuropathy: Secondary | ICD-10-CM

## 2017-02-09 DIAGNOSIS — G4733 Obstructive sleep apnea (adult) (pediatric): Secondary | ICD-10-CM | POA: Insufficient documentation

## 2017-02-09 MED ORDER — CLONAZEPAM 0.5 MG PO TABS: 1.0000 mg | ORAL_TABLET | Freq: Every day | ORAL | 3 refills | Status: DC

## 2017-02-09 NOTE — Progress Notes (Signed)
  PATIENT: Kristine [name] DOB: 06/24/1945  Chief Complaint  Patient presents with  . Peripheral Neuropathy/RLS    She is here with her husband, Joseph.  She would like to further review her MRI.  She was unable to tolerate Requip and stopped it.  Her PCP started her on Lyrica 50mg, one tab TID but has recently increased it to 100mg TID.  She is still symptomatic even with the higher dose.  She never went to PT but she is willing to try it.  . Sleep Apnea    She canceled her sleep consult appt with Dr. Athar.  She is now ready to consider a sleep consult.     HISTORICAL  Kristine [name] [age] years old right-handed female accompanied by her husband, seen in refer by Dr. Hewitt, John, for evaluation of bilateral feet paresthesia, lower extremity swelling, her primary care physician is Dr. Avva, Ravisankar, initial evaluation was on Jul 29 2016.  She had a history of hypertension, diabetes since 2002, insulin dependent since 2011, lumbar stenosis, gout, she has a history of atrial fibrillation, required multiple cardial conversion in the past, could not tolerate medicine with potential QT prolongation side effect, such as higher dose of gabapentin. She is on chronic anticoagulation Xarelto, chronic renal disease, she is retired nurse.  She noticed bilateral feet paresthesia since 2014, starting at the plantar surface distal toes, gradually getting up, now feel like a stocking to distal leg level, since January 2018, she also noticed bilateral fingertips paresthesia,  She also complains of neuropathic pain, especially after bearing weight, gradually getting worse, especially since her gout flareup in February 2018, she had significant bilateral toe joints pain, was treated with steroid, with poorly controlled diabetes during that period of time.  For a while, she was taking gabapentin up to 600 mg every night, which has helped her symptoms some, but due to her cardiac arrhythmia, risk for  prolonged QT interval, gabapentin was decreased to 100 mg every night, she complains of significant nighttime leg discomfort, urge to move, has to get up 2 AM last night take a shower, she has been treated with amiodarone 200 mg since 2017.  She also complains of low back pain, was diagnosed with lumbar stenosis in the past, now denied radiating pain, has urinary urgency, denies incontinence.  She has obesity, snoring sometimes, significant daytime sleepiness, fatigue, difficulty falling to sound nighttime sleep. Shortness of breath with minimal exertion  I reviewed the laboratory in February 2018, glucose was elevated 184, creatinine was 1.9, GFR was 26.2, CBC showed hemoglobin of 14 point 1, mild elevated glucose elevated TSH 6.5 in November 2017.   Update September 02 2016: Laboratory evaluation in May 2018 reviewed, normal vitamin D, 48, B12 430, negative RPR, ANA, normal folic acid, mild elevated C reactive protein 14.2,Thyroid functional test, CPK, ESR, elevated A1c 9.4, normal protein electrophoresis, copper level 164,   We have personally reviewed MRI of lumbar spine multilevel degenerative changes, there was no significant foraminal canal stenosis.  EMG nerve conduction study on September 02 2016 showed mild length dependent axonal peripheral neuropathy, no evidence of lumbosacral radiculopathy  She could not tolerate Requip, felt out of body  UPDATE Feb 09 2017: She is now taking Lyrica 50 mg to 3 times a day which has helped her feet paresthesia, she has trouble sleeping, she only refill clonazepam once in June 2018, she is crying, just lost her brother, has difficulty feeling the pedicle while playing the   organ, also has fingertips numbness,   REVIEW OF SYSTEMS: Full 14 system review of systems performed and notable only for weight gain, fatigue, palpitation, blurred vision, easy bruising, easy bleeding, feeling cold, numbness, insomnia, not enough sleep  ALLERGIES: Allergies  Allergen  Reactions  . Albuterol Other (See Comments)    REACTION: Tachycardia- AFib  . Epinephrine Other (See Comments)    Increases heart rate per patient.   . Penicillins Other (See Comments)    REACTION: flushing \T\ hot  . Bee Venom Other (See Comments)    "makes me nervous"  . Ivp Dye [Iodinated Diagnostic Agents] Other (See Comments)    flushing    HOME MEDICATIONS: Current Outpatient Medications  Medication Sig Dispense Refill  . ALPRAZolam (XANAX) 0.25 MG tablet Take 0.125-0.25 mg by mouth 2 (two) times daily as needed for anxiety.   0  . BD PEN NEEDLE NANO U/F 32G X 4 MM MISC Inject 1 each as directed See admin instructions. Use pen needles with insulin pens daily  11  . clindamycin (CLEOCIN) 300 MG capsule TAKE 2 CAPSULES BY MOUTH 1 HOUR PRIOR TO DENTAL APPOINTMENT  0  . clonazePAM (KLONOPIN) 0.5 MG tablet Take 2 tablets (1 mg total) by mouth at bedtime. 1- 2tabs as needed for sleep 60 tablet 3  . colchicine 0.6 MG tablet Take 0.6 mg by mouth daily.    . esomeprazole (NEXIUM) 40 MG capsule Take 40 mg by mouth daily as needed (for acid reflux).     . furosemide (LASIX) 80 MG tablet TAKE 1 TABLET (80 MG TOTAL) BY MOUTH 2 (TWO) TIMES DAILY. 180 tablet 3  . HUMALOG KWIKPEN 200 UNIT/ML SOPN Inject 26 Units into the skin 3 (three) times daily with meals.   6  . insulin detemir (LEVEMIR) 100 UNIT/ML injection Inject 70 Units into the skin 2 (two) times daily.     . KLOR-CON 10 10 MEQ tablet Take 20 mEq by mouth daily.  11  . levothyroxine (SYNTHROID, LEVOTHROID) 25 MCG tablet Take 1 tablet by mouth daily.    . Lidocaine 0.5 % GEL Apply 4 g topically 3 (three) times daily. 170 g 6  . losartan (COZAAR) 100 MG tablet TAKE 1 TABLET BY MOUTH EVERY DAY 90 tablet 3  . metoprolol succinate (TOPROL-XL) 50 MG 24 hr tablet Take 1 and 1/2 tablet (75mg) by mouth twice a day 90 tablet 3  . pregabalin (LYRICA) 50 MG capsule Take 100 mg by mouth.    . Vitamin D, Ergocalciferol, (DRISDOL) 50000 UNITS CAPS  Take 50,000 Units by mouth every 7 (seven) days. Takes on Thursday    . XARELTO 15 MG TABS tablet Take 15 mg by mouth daily.     No current facility-administered medications for this visit.     PAST MEDICAL HISTORY: Past Medical History:  Diagnosis Date  . Anxiety   . Asthma   . Atrial fibrillation (HCC)   . CHF (congestive heart failure) (HCC)   . Chronic bronchitis (HCC)   . GERD (gastroesophageal reflux disease)   . High cholesterol   . History of hiatal hernia   . HTN (hypertension)   . Mild aortic sclerosis (HCC)   . Morbid obesity (HCC)   . Osteoporosis   . Persistent atrial fibrillation (HCC)   . Scoliosis   . Spinal stenosis   . Type II diabetes mellitus (HCC)   . Vitamin D deficiency     PAST SURGICAL HISTORY: Past Surgical History:  Procedure Laterality Date  .   BLADDER SUSPENSION  1980s  . CARDIAC CATHETERIZATION  11/16/09   SMOOTH AND NORMAL  . CARDIOVERSION N/A 03/11/2015   Performed by Edgecombe, Tiffany, MD at [facility]  . CARDIOVERSION N/A 01/07/2015   Performed by Ralston, Tiffany, MD at [facility]  . CARPAL TUNNEL RELEASE Left 1970s  . CATARACT EXTRACTION W/ INTRAOCULAR LENS  IMPLANT, BILATERAL Bilateral ~ 2010  . COLONOSCOPY  08/14/08  . FINGER FRACTURE SURGERY Right 1970s   "ring finger"  . FRACTURE SURGERY    . LAPAROSCOPIC CHOLECYSTECTOMY  1980s  . TUBAL LIGATION    . VAGINAL HYSTERECTOMY  1980    FAMILY HISTORY: Family History  Problem Relation Age of Onset  . Stroke Father   . Hypertension Father   . Atrial fibrillation Father        HAD MURMUR  . Heart failure Mother   . Congestive Heart Failure Mother   . Hypertension Mother   . Hypertension Brother   . Hypertension Sister   . Hypertension Son   . CAD Sister        EARLY  . CAD Brother        EARLY    SOCIAL HISTORY:  Social History   Socioeconomic History  . Marital status: Married    Spouse name: Not on file  . Number of children: 4  . Years of education: 14  .  Highest education level: Not on file  Social Needs  . Financial resource strain: Not on file  . Food insecurity - worry: Not on file  . Food insecurity - inability: Not on file  . Transportation needs - medical: Not on file  . Transportation needs - non-medical: Not on file  Occupational History  . Not on file  Tobacco Use  . Smoking status: Never Smoker  . Smokeless tobacco: Never Used  Substance and Sexual Activity  . Alcohol use: No  . Drug use: No  . Sexual activity: Yes  Other Topics Concern  . Not on file  Social History Narrative   Pt lives in [place] with spouse.   Retired public health nurse.  RN.   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   Right-handed.   1 cup caffeine per day.     PHYSICAL EXAM   Vitals:   02/09/17 0857  BP: 115/74  Pulse: 85  Weight: 279 lb (126.6 kg)  Height: 5' 8" (1.727 m)    Not recorded      Body mass index is 42.42 kg/m.  PHYSICAL EXAMNIATION:  Gen: NAD, conversant, well nourised, obese, well groomed                     Cardiovascular: Regular rate rhythm, no peripheral edema, warm, nontender. Eyes: Conjunctivae clear without exudates or hemorrhage Neck: Supple, no carotid bruits. Pulmonary: Clear to auscultation bilaterally   NEUROLOGICAL EXAM:  MENTAL STATUS:Obese, tired-looking elderly female Speech:    Speech is normal; fluent and spontaneous with normal comprehension.  Cognition:     Orientation to time, place and person     Normal recent and remote memory     Normal Attention span and concentration     Normal Language, naming, repeating,spontaneous speech     Fund of knowledge   CRANIAL NERVES: CN II: Visual fields are full to confrontation. Fundoscopic exam is normal with sharp discs and no vascular changes. Pupils are round equal and briskly reactive to light.   CN III, IV, VI: extraocular movement are normal. No ptosis. CN V: Facial sensation is  intact to pinprick in all 3 divisions bilaterally. Corneal responses are intact.  CN VII: Face is symmetric with normal eye closure and smile. CN VIII: Hearing is normal to rubbing fingers CN IX, X: Palate elevates symmetrically. Phonation is normal. CN XI: Head turning and shoulder shrug are intact CN XII: Tongue is midline with normal movements and no atrophy.  MOTOR: There is no pronator drift of out-stretched arms. Muscle bulk and tone are normal. Muscle strength is normal. Bilateral lower extremity pitting edema, skin discoloration  REFLEXES: Reflexes are 2+ and symmetric at the biceps, triceps, knees, and  absent at ankles. Plantar responses are flexor.  SENSORY: Length dependent decreased to to light touch, pinprick and vibratory sensation to knee level  COORDINATION: Rapid alternating movements and fine finger movements are intact. There is no dysmetria on finger-to-nose and heel-knee-shin.    GAIT/STANCE: She needs push up to get up from seated position, wide based, unsteady  Romberg is absent.   DIAGNOSTIC DATA (LABS, IMAGING, TESTING) - I reviewed patient records, labs, notes, testing and imaging myself where available.   ASSESSMENT AND PLAN  Kristine [name] is a 70 y.o. female   Peripheral neuropathy  She has  factor of diabetes, poorly controlled A1c 9.4,  amiodarone use  EMG nerve conduction study on June 2018 has demonstrated length dependent axonal peripheral neuropathy, there is no evidence of lumbosacral radiculopathy.  At risk for developing prolonged QT interval with neuropathic pain medications, will give her lidocaine cream  MRI of lumbar showed multilevel degenerative changes, no significant canal or foraminal stenosis  Restless legs syndrome, chronic insomnia  She could not tolerate Requip 0.5 milligrams, feel she is out of her body  low dose clonazepam 0.5 milligrams 1-2 tablets every night  Obstructive sleep apnea  Refer her to sleep study  Gait  abnormality   refer her to physical therapy  Yijun Yan, M.D. Ph.D.  [facility] [address], [address] [place], [place] [address] Ph: [phone] Fax: [phone]  CC:Hewitt, John, MD Avva, Ravisankar, MD 

## 2017-02-17 ENCOUNTER — Encounter: Payer: Self-pay | Admitting: Cardiovascular Disease

## 2017-02-17 DIAGNOSIS — I10 Essential (primary) hypertension: Secondary | ICD-10-CM | POA: Diagnosis not present

## 2017-02-17 DIAGNOSIS — Z794 Long term (current) use of insulin: Secondary | ICD-10-CM | POA: Diagnosis not present

## 2017-02-17 DIAGNOSIS — N184 Chronic kidney disease, stage 4 (severe): Secondary | ICD-10-CM | POA: Diagnosis not present

## 2017-02-17 DIAGNOSIS — Z6841 Body Mass Index (BMI) 40.0 and over, adult: Secondary | ICD-10-CM | POA: Diagnosis not present

## 2017-02-17 DIAGNOSIS — E1165 Type 2 diabetes mellitus with hyperglycemia: Secondary | ICD-10-CM | POA: Diagnosis not present

## 2017-02-18 ENCOUNTER — Emergency Department (HOSPITAL_COMMUNITY): Payer: Medicare HMO

## 2017-02-18 ENCOUNTER — Emergency Department (HOSPITAL_COMMUNITY)
Admission: EM | Admit: 2017-02-18 | Discharge: 2017-02-18 | Disposition: A | Discharge status: Home / Self Care | Payer: Medicare HMO | Attending: Emergency Medicine | Admitting: Emergency Medicine

## 2017-02-18 ENCOUNTER — Telehealth: Payer: Self-pay | Admitting: Physician Assistant

## 2017-02-18 ENCOUNTER — Other Ambulatory Visit: Payer: Self-pay

## 2017-02-18 ENCOUNTER — Encounter (HOSPITAL_COMMUNITY): Payer: Self-pay | Admitting: Emergency Medicine

## 2017-02-18 DIAGNOSIS — I110 Hypertensive heart disease with heart failure: Secondary | ICD-10-CM | POA: Insufficient documentation

## 2017-02-18 DIAGNOSIS — Z794 Long term (current) use of insulin: Secondary | ICD-10-CM | POA: Insufficient documentation

## 2017-02-18 DIAGNOSIS — I509 Heart failure, unspecified: Secondary | ICD-10-CM | POA: Insufficient documentation

## 2017-02-18 DIAGNOSIS — I5032 Chronic diastolic (congestive) heart failure: Secondary | ICD-10-CM | POA: Diagnosis not present

## 2017-02-18 DIAGNOSIS — R0602 Shortness of breath: Secondary | ICD-10-CM | POA: Diagnosis not present

## 2017-02-18 DIAGNOSIS — Z79899 Other long term (current) drug therapy: Secondary | ICD-10-CM | POA: Diagnosis not present

## 2017-02-18 DIAGNOSIS — J45909 Unspecified asthma, uncomplicated: Secondary | ICD-10-CM | POA: Insufficient documentation

## 2017-02-18 LAB — I-STAT TROPONIN, ED: TROPONIN I, POC: 0.01 ng/mL (ref 0.00–0.08)

## 2017-02-18 LAB — CBC
HCT: 40.1 % (ref 36.0–46.0)
Hemoglobin: 13 g/dL (ref 12.0–15.0)
MCH: 29.8 pg (ref 26.0–34.0)
MCHC: 32.4 g/dL (ref 30.0–36.0)
MCV: 92 fL (ref 78.0–100.0)
Platelets: 147 10*3/uL — ABNORMAL LOW (ref 150–400)
RBC: 4.36 MIL/uL (ref 3.87–5.11)
RDW: 13.8 % (ref 11.5–15.5)
WBC: 9.1 10*3/uL (ref 4.0–10.5)

## 2017-02-18 LAB — BASIC METABOLIC PANEL
ANION GAP: 9 (ref 5–15)
BUN: 36 mg/dL — AB (ref 6–20)
CALCIUM: 9.2 mg/dL (ref 8.9–10.3)
CO2: 26 mmol/L (ref 22–32)
Chloride: 101 mmol/L (ref 101–111)
Creatinine, Ser: 1.9 mg/dL — ABNORMAL HIGH (ref 0.44–1.00)
GFR calc Af Amer: 30 mL/min — ABNORMAL LOW (ref >60)
GFR, EST NON AFRICAN AMERICAN: 26 mL/min — AB (ref >60)
GLUCOSE: 129 mg/dL — AB (ref 65–99)
Potassium: 3.7 mmol/L (ref 3.5–5.1)
Sodium: 136 mmol/L (ref 135–145)

## 2017-02-18 LAB — BRAIN NATRIURETIC PEPTIDE: B Natriuretic Peptide: 300.8 pg/mL — ABNORMAL HIGH (ref 0.0–100.0)

## 2017-02-18 LAB — CBG MONITORING, ED: Glucose-Capillary: 120 mg/dL — ABNORMAL HIGH (ref 65–99)

## 2017-02-18 NOTE — ED Notes (Signed)
Pt verbalized understanding of d/c instructions and has no further questions. VSS, NAD. Pt to follow up with Dr Dagmar Hait tomorrow. Pt removed all belongings from room.

## 2017-02-18 NOTE — ED Triage Notes (Signed)
Pt c/o SOB and 10lb weight gain over 6 days after her diabetes educator told her to increase her water intake. She states she has gotten increasingly uncomfortable and short of breath. States she took an 80mg  lasix PTA.

## 2017-02-18 NOTE — Telephone Encounter (Signed)
Paged by patient regarding SOB and 10 lbs weight gain, per page, patient is going to ED and wish Dr. Acie Fredrickson her primary cardiologist to be involved. Pending ED workup first, Dr. Acie Fredrickson is rounding this week in the hospital.   I have tried to called her back 3 times with no answer. Eventually reached her daughter who gave me her cell phone [phone]. She is on her way to ED, she says she has been gaining weight for the past week. Pending ED workup  Signed, Almyra Deforest PA Pager: [phone]

## 2017-02-18 NOTE — ED Notes (Signed)
Pt ambulated to bathroom on Pod C. Pt stayed between 98%-100% the whole walk. Pt stated she did not get short of breathe at all while walking.

## 2017-02-18 NOTE — ED Provider Notes (Signed)
[facility] EMERGENCY DEPARTMENT Provider Note   CSN: [id number] Arrival date & time: 02/18/17  1818     History   Chief Complaint Chief Complaint  Patient presents with  . Shortness of Breath    HPI Kristine [name] is a 71 y.o. female.  Pt presents to the ED today with sob.  The pt has a hx of CHF and is supposed to have a fluid restriction.  The pt saw the dietician at Dr. Krista Blue (neurology)'s office on 11/20 who told her to drink lots of fluids.  The pt said she's gained 10 lbs and has had more sob.  She took an extra 80 mg of lasix prior to arrival here (around 5).  So a total of 3 80 mg doses.  She has urinated multiple times and now feels better.  Pt denies any cp.  Pt has a.fib and is on xarelto.  CHA2DS2/VAS Stroke Risk Points      5 >= 2 Points: High Risk  1 - 1.99 Points: Medium Risk  0 Points: Low Risk    The patient's score has not changed in the past year.:  No Change     Details    This score determines the patient's risk of having a stroke if the  patient has atrial fibrillation.       Points Metrics  1 Has Congestive Heart Failure:  Yes   0 Has Vascular Disease:  No   1 Has Hypertension:  Yes   1 Age:  61   1 Has Diabetes:  Yes   0 Had Stroke:  No  Had TIA:  No  Had thromboembolism:  No   1 Female:  Yes                Past Medical History:  Diagnosis Date  . Anxiety   . Asthma   . Atrial fibrillation ([place])   . CHF (congestive heart failure) ([place])   . Chronic bronchitis ([place])   . GERD (gastroesophageal reflux disease)   . High cholesterol   . History of hiatal hernia   . HTN (hypertension)   . 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 Morbid obesity ([place])   . Osteoporosis   . Persistent atrial fibrillation ([place])   . Scoliosis   . Spinal stenosis   . Type II diabetes mellitus ([place])   . Vitamin D deficiency     Patient Active Problem List   Diagnosis Date Noted  . Gait abnormality 02/09/2017  . Obstructive sleep apnea  02/09/2017  . Diabetic peripheral neuropathy ([place]) 09/02/2016  . Peripheral neuropathy 07/29/2016  . Spinal stenosis of lumbar region 07/29/2016  . Excessive daytime sleepiness 07/29/2016  . Morbid obesity ([place]) 07/29/2016  . 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 Community acquired pneumonia 05/31/2015  . CAP (community acquired pneumonia) 05/31/2015  . Anemia 05/31/2015  . Diastolic CHF, chronic ([place]) 05/31/2015  . Paroxysmal atrial fibrillation (HCC)   . Obesity 10/15/2013  . Diastolic dysfunction with chronic heart failure ([place]) 06/23/2013  . Atrial fibrillation ([place]) 06/20/2010  . Pneumonia   . Abnormal cardiovascular function study   . High cholesterol   . Scoliosis   . Spinal stenosis   . Anxiety   . Osteoarthritis   . Osteoporosis   . Mild aortic sclerosis ([place])   . Degenerative disc disease   . Vitamin D deficiency   . HEMOPTYSIS UNSPECIFIED 05/12/2010  . Diabetes mellitus, type 2 ([place]) 11/19/2009  . Hypertensive cardiovascular disease 11/19/2009  .  DYSPNEA ON EXERTION 11/19/2009    Past Surgical History:  Procedure Laterality Date  . BLADDER SUSPENSION  1980s  . CARDIAC CATHETERIZATION  11/16/09   SMOOTH AND NORMAL  . CARDIOVERSION N/A 01/07/2015   Procedure: CARDIOVERSION;  Surgeon: Skeet Latch, MD;  Location: [facility] ENDOSCOPY;  Service: Cardiovascular;  Laterality: N/A;  . CARDIOVERSION N/A 03/11/2015   Procedure: CARDIOVERSION;  Surgeon: Skeet Latch, MD;  Location: [facility];  Service: Cardiovascular;  Laterality: N/A;  . CARPAL TUNNEL RELEASE Left 1970s  . CATARACT EXTRACTION W/ INTRAOCULAR LENS  IMPLANT, BILATERAL Bilateral ~ 2010  . COLONOSCOPY  08/14/08  . FINGER FRACTURE SURGERY Right 1970s   "ring finger"  . FRACTURE SURGERY    . LAPAROSCOPIC CHOLECYSTECTOMY  1980s  . TUBAL LIGATION    . VAGINAL HYSTERECTOMY  1980    OB History    No data available       Home Medications    Prior to Admission medications   Medication Sig Start Date End  Date Taking? Authorizing Provider  ALPRAZolam (XANAX) 0.25 MG tablet Take 0.125-0.25 mg by mouth 2 (two) times daily as needed for anxiety.  03/26/14   [provider]  BD PEN NEEDLE NANO U/F 32G X 4 MM MISC Inject 1 each as directed See admin instructions. Use pen needles with insulin pens daily 12/24/14   [provider]  clindamycin (CLEOCIN) 300 MG capsule TAKE 2 CAPSULES BY MOUTH 1 HOUR PRIOR TO DENTAL APPOINTMENT 03/02/16   [provider]  clonazePAM (KLONOPIN) 0.5 MG tablet Take 2 tablets (1 mg total) at bedtime by mouth. 1- 2tabs as needed for sleep 02/09/17   Marcial Pacas, MD  colchicine 0.6 MG tablet Take 0.6 mg by mouth daily.    [provider]  esomeprazole (NEXIUM) 40 MG capsule Take 40 mg by mouth daily as needed (for acid reflux).     [provider]  furosemide (LASIX) 80 MG tablet TAKE 1 TABLET (80 MG TOTAL) BY MOUTH 2 (TWO) TIMES DAILY. 03/31/16   Nahser, Wonda Cheng, MD  HUMALOG KWIKPEN 200 UNIT/ML SOPN Inject 26 Units into the skin 3 (three) times daily with meals.  12/13/14   [provider]  insulin detemir (LEVEMIR) 100 UNIT/ML injection Inject 70 Units into the skin 2 (two) times daily.     [provider]  KLOR-CON 10 10 MEQ tablet Take 20 mEq by mouth daily. 05/15/14   [provider]  levothyroxine (SYNTHROID, LEVOTHROID) 25 MCG tablet Take 1 tablet by mouth daily. 10/24/15   [provider]  Lidocaine 0.5 % GEL Apply 4 g topically 3 (three) times daily. 07/29/16   Marcial Pacas, MD  losartan (COZAAR) 100 MG tablet TAKE 1 TABLET BY MOUTH EVERY DAY 03/31/16   Nahser, Wonda Cheng, MD  metoprolol succinate (TOPROL-XL) 50 MG 24 hr tablet Take 1 and 1/2 tablet (75mg ) by mouth twice a day 09/22/16   Sherran Needs, NP  pregabalin (LYRICA) 50 MG capsule Take 100 mg by mouth.    [provider]  Vitamin D, Ergocalciferol, (DRISDOL) 50000 UNITS CAPS Take 50,000 Units by mouth every 7 (seven) days. Takes on Thursday     [provider]  XARELTO 15 MG TABS tablet Take 15 mg by mouth daily. 06/27/16   [provider]    Family History Family History  Problem Relation Age of Onset  . Stroke Father   . Hypertension Father   . Atrial fibrillation Father  HAD MURMUR  . Heart failure Mother   . Congestive Heart Failure Mother   . Hypertension Mother   . Hypertension Brother   . Hypertension Sister   . Hypertension Son   . CAD Sister        EARLY  . CAD Brother        EARLY    Social History Social History   Tobacco Use  . Smoking status: Never Smoker  . Smokeless tobacco: Never Used  Substance Use Topics  . Alcohol use: No  . Drug use: No     Allergies   Albuterol; Epinephrine; Penicillins; Bee venom; and Ivp dye [iodinated diagnostic agents]   Review of Systems Review of Systems  Respiratory: Positive for shortness of breath.   All other systems reviewed and are negative.    Physical Exam Updated Vital Signs BP 125/75 (BP Location: Right Arm)   Pulse 93   Temp 98.1 F (36.7 C) (Oral)   Resp 15   Ht 5\' 8"  (1.727 m)   Wt 128.8 kg (284 lb)   SpO2 97%   BMI 43.18 kg/m   Physical Exam  Constitutional: She is oriented to person, place, and time. She appears well-developed and well-nourished.  HENT:  Head: Normocephalic and atraumatic.  Mouth/Throat: Oropharynx is clear and moist.  Eyes: EOM are normal. Pupils are equal, round, and reactive to light.  Neck: Normal range of motion. Neck supple.  Cardiovascular: Normal rate and normal heart sounds. An irregularly irregular rhythm present.  Pulmonary/Chest: Effort normal and breath sounds normal.  Abdominal: Soft. Bowel sounds are normal.  Musculoskeletal: Normal range of motion.       Right lower leg: Normal.       Left lower leg: Normal.  Neurological: She is alert and oriented to person, place, and time.  Skin: Skin is warm and dry. Capillary refill takes less than 2 seconds.  Psychiatric: She has  a normal mood and affect. Her behavior is normal.  Nursing note and vitals reviewed.    ED Treatments / Results  Labs (all labs ordered are listed, but only abnormal results are displayed) Labs Reviewed  BASIC METABOLIC PANEL - Abnormal; Notable for the following components:      Result Value   Glucose, Bld 129 (*)    BUN 36 (*)    Creatinine, Ser 1.90 (*)    GFR calc non Af Amer 26 (*)    GFR calc Af Amer 30 (*)    All other components within normal limits  CBC - Abnormal; Notable for the following components:   Platelets 147 (*)    All other components within normal limits  BRAIN NATRIURETIC PEPTIDE - Abnormal; Notable for the following components:   B Natriuretic Peptide 300.8 (*)    All other components within normal limits  CBG MONITORING, ED - Abnormal; Notable for the following components:   Glucose-Capillary 120 (*)    All other components within normal limits  I-STAT TROPONIN, ED    EKG  EKG Interpretation  Date/Time:  Thursday February 18 2017 18:31:20 EST Ventricular Rate:  93 PR Interval:    QRS Duration: 90 QT Interval:  388 QTC Calculation: 482 R Axis:   149 Text Interpretation:  Atrial fibrillation with premature ventricular or aberrantly conducted complexes Right axis deviation Low voltage QRS Abnormal ECG No significant change since last tracing Confirmed by Isla Pence [phone]) on 02/18/2017 9:19:10 PM       Radiology Dg Chest 2 View  Result Date:  02/18/2017 CLINICAL DATA:  Shortness of breath for 24 hours. EXAM: CHEST  2 VIEW COMPARISON:  05/30/2015 FINDINGS: Mild hyperexpansion. The cardio pericardial silhouette is enlarged. Interstitial markings are diffusely coarsened with chronic features. Atelectasis or scarring left mid lung. Stable appearance prominent vascular markings retrocardiac left base. The visualized bony structures of the thorax are intact. IMPRESSION: No active cardiopulmonary disease. Electronically Signed   By: Misty Stanley  M.D.   On: 02/18/2017 19:52    Procedures Procedures (including critical care time)  Medications Ordered in ED Medications - No data to display   Initial Impression / Assessment and Plan / ED Course  I have reviewed the triage vital signs and the nursing notes.  Pertinent labs & imaging results that were available during my care of the patient were reviewed by me and considered in my medical decision making (see chart for details).     Kidney function is chronic.  Pt is feeling good.  She is able to ambulate without her oxygen saturations dropping.  She is instructed to return to her normal 6 glasses of water or less per day and to f/u with pcp.  Final Clinical Impressions(s) / ED Diagnoses   Final diagnoses:  Congestive heart failure, unspecified HF chronicity, unspecified heart failure type [facility])    ED Discharge Orders    None       Isla Pence, MD 02/18/17 2218

## 2017-02-23 DIAGNOSIS — E1165 Type 2 diabetes mellitus with hyperglycemia: Secondary | ICD-10-CM | POA: Diagnosis not present

## 2017-02-23 DIAGNOSIS — Z6841 Body Mass Index (BMI) 40.0 and over, adult: Secondary | ICD-10-CM | POA: Diagnosis not present

## 2017-02-23 DIAGNOSIS — I509 Heart failure, unspecified: Secondary | ICD-10-CM | POA: Diagnosis not present

## 2017-02-23 DIAGNOSIS — E1140 Type 2 diabetes mellitus with diabetic neuropathy, unspecified: Secondary | ICD-10-CM | POA: Diagnosis not present

## 2017-02-23 DIAGNOSIS — I10 Essential (primary) hypertension: Secondary | ICD-10-CM | POA: Diagnosis not present

## 2017-02-23 DIAGNOSIS — K5900 Constipation, unspecified: Secondary | ICD-10-CM | POA: Diagnosis not present

## 2017-02-25 ENCOUNTER — Ambulatory Visit: Payer: Medicare HMO | Admitting: Cardiovascular Disease

## 2017-02-25 ENCOUNTER — Encounter: Payer: Self-pay | Admitting: Cardiovascular Disease

## 2017-02-25 VITALS — BP 110/66 | HR 82 | Ht 69.0 in | Wt 275.0 lb

## 2017-02-25 DIAGNOSIS — I482 Chronic atrial fibrillation, unspecified: Secondary | ICD-10-CM

## 2017-02-25 DIAGNOSIS — Z13220 Encounter for screening for lipoid disorders: Secondary | ICD-10-CM

## 2017-02-25 DIAGNOSIS — I5032 Chronic diastolic (congestive) heart failure: Secondary | ICD-10-CM

## 2017-02-25 NOTE — Progress Notes (Signed)
Cardiology Office Note   Date:  02/25/2017   ID:  Kristine [name], DOB 09-10-1945, MRN [id number]  PCP:  Kristine Solian, MD  Cardiologist:   Kristine Moores, MD   Chief Complaint  Patient presents with  . Atrial Fibrillation   1. Atrial fibrillation 2. Diabetes mellitus 3. Chronic diastolic CHF 4. Obesity.   History of Present Illness:  Kristine [name] is a 71 year old female with a history of atrial fibrillation. She has done well since I last saw her. She's not had any episodes of chest pain or shortness breath. Her blood pressure readings have been normal.  She is tolerating the Xarelto fairly well. She has some occasional bleeding from the gums. She enjoys not having to check her INR levels.   She has occasional palpitations and heart racing. This typically can rest and these will resolve. She is able to wak about 10 minutes at a time.   Oct. 24, 2014:  June 23, 2013:  Kristine [name]'s BP is elevated. She had a recent echocardiogram that revealed a left ventricle systolic function that was at the lower limits of normal-50%. Study Conclusions  - Left ventricle: The cavity size was normal. Wall thickness was increased in a pattern of mild LVH. The estimated ejection fraction was 50%. Diffuse hypokinesis. Features are consistent with a pseudonormal left ventricular filling pattern, with concomitant abnormal relaxation and increased filling pressure (grade 2 diastolic dysfunction). - Aortic valve: There was no stenosis. - Mitral valve: Mildly calcified annulus. Mildly calcified leaflets . Trivial regurgitation. - Left atrium: The atrium was moderately dilated. - Right ventricle: The cavity size was normal. Systolic function was mildly reduced. - Right atrium: The atrium was moderately dilated. - Systemic veins: IVC measured 1.7 cm with < 50% respirophasic variation, suggesting RA pressure 8 mmHg. - Pericardium, extracardiac: A trivial pericardial effusion was identified  posterior to the heart  She was having some abdominal pain and   September 29, 2013:  Kristine [name] is doing well. Stressed about coming here.   Jan. 7, 2015:  Kristine [name] is a 71 yo with hx of Afib and diastolic dysfunction She converted to NSR in the hospital with flecainide. She was tried on [place] but her QT interval prolonged. Was changed to Flecainide and has maintained NSR.  She is exercising regularly - 30 minutes a day. Is on Lasix 80 mg a day and mtalazone 2. 5 once a week. Is still retaining fluid. Trying to avoid salt in food.    June 11, 2014:   Kristine [name] is a 71 y.o. female who presents for follow-up of her atrial fibrillation. She has had some issues with fluid retention and  has been on metalazone.   Sept. 19, 2016:  Doing well.  Has had some shortness of breath - improved with increasing lasix to 80 BID .  BP is still high this am.  BP at home has been normal .  June 18, 2015:  She has tried [place] but had prolonged QT - so it was stopped Tried on flecainide Was tried on amiodarone  Had a cardioversion in Dec.   Amiodarone dose was lowered to 200 mg a day and she is now back in atrial fib She is not as symptomatic today  She is very fatigued. Has not been sleeping well at all Has lots of arm fatigue in her arms with any activity   Nov. 2, 2017:    Still short of breath No CP  Active,  Has a pins and needle  sensation   August 27, 2016:  Kristine [name] is seen back today for follow up of her atrial fib  Has failed Flecainide, tikosyn and now  Is on amiodarone.   We had her on a reduced dose of amiodarone due to symptoms of peripheral neuropathy. The dose was increased up to 200 mg daily by Kristine [name].  She is tentatively scheduled for cardioversion. She does not want to consider an ablation .   February 25, 2017:  Kristine [name] is seen back today for her chronic atrial fibrillation. She has discontinued the amiodarone.  She seems to be tolerating the atrial  fibrillation quite well.  We have adopted a strategy of anticoagulation and rate control.  She developed acute on chronic diastolic CHF last Thursday.   She has been on Lyrica  Went to the ER.   She took an extra Lasix on the way there and urinated all of the extra weight  She still takes metolazone very rarely - perhaps   Past Medical History:  Diagnosis Date  . Anxiety   . Asthma   . Atrial fibrillation ([place])   . CHF (congestive heart failure) ([place])   . Chronic bronchitis ([place])   . GERD (gastroesophageal reflux disease)   . High cholesterol   . History of hiatal hernia   . HTN (hypertension)   . Mild aortic sclerosis ([place])   . Morbid obesity ([place])   . Osteoporosis   . Persistent atrial fibrillation ([place])   . Scoliosis   . Spinal stenosis   . Type II diabetes mellitus ([place])   . Vitamin D deficiency     Past Surgical History:  Procedure Laterality Date  . BLADDER SUSPENSION  1980s  . CARDIAC CATHETERIZATION  11/16/09   SMOOTH AND NORMAL  . CARDIOVERSION N/A 01/07/2015   Procedure: CARDIOVERSION;  Surgeon: Skeet Latch, MD;  Location: [facility] ENDOSCOPY;  Service: Cardiovascular;  Laterality: N/A;  . CARDIOVERSION N/A 03/11/2015   Procedure: CARDIOVERSION;  Surgeon: Skeet Latch, MD;  Location: [place];  Service: Cardiovascular;  Laterality: N/A;  . CARPAL TUNNEL RELEASE Left 1970s  . CATARACT EXTRACTION W/ INTRAOCULAR LENS  IMPLANT, BILATERAL Bilateral ~ 2010  . COLONOSCOPY  08/14/08  . FINGER FRACTURE SURGERY Right 1970s   "ring finger"  . FRACTURE SURGERY    . LAPAROSCOPIC CHOLECYSTECTOMY  1980s  . TUBAL LIGATION    . VAGINAL HYSTERECTOMY  1980     Current Outpatient Medications  Medication Sig Dispense Refill  . ALPRAZolam (XANAX) 0.25 MG tablet Take 0.125-0.25 mg by mouth 2 (two) times daily as needed for anxiety.   0  . BD PEN NEEDLE NANO U/F 32G X 4 MM MISC Inject 1 each as directed See admin instructions. Use pen needles with insulin pens daily  11  .  clindamycin (CLEOCIN) 300 MG capsule TAKE 2 CAPSULES BY MOUTH 1 HOUR PRIOR TO DENTAL APPOINTMENT  0  . colchicine 0.6 MG tablet Take 0.6 mg by mouth daily.    Marland Kitchen esomeprazole (NEXIUM) 40 MG capsule Take 40 mg by mouth daily as needed (for acid reflux).     Marland Kitchen estradiol (ESTRACE) 0.1 MG/GM vaginal cream Apply daily    . furosemide (LASIX) 80 MG tablet TAKE 1 TABLET (80 MG TOTAL) BY MOUTH 2 (TWO) TIMES DAILY. 180 tablet 3  . HUMALOG KWIKPEN 200 UNIT/ML SOPN Inject 26 Units into the skin daily.   6  . insulin detemir (LEVEMIR) 100 UNIT/ML injection Inject 70 Units into the skin 2 (two) times daily.     Marland Kitchen  KLOR-CON 10 10 MEQ tablet Take 20 mEq by mouth daily.  11  . levothyroxine (SYNTHROID, LEVOTHROID) 25 MCG tablet Take 1 tablet by mouth daily.    . Lidocaine 0.5 % GEL Apply 4 g topically 3 (three) times daily. 170 g 6  . losartan (COZAAR) 100 MG tablet TAKE 1 TABLET BY MOUTH EVERY DAY 90 tablet 3  . metolazone (ZAROXOLYN) 2.5 MG tablet TAKE 1 TABLET DAILY 30 MINS PRIOR TO LASIX FOR SWELLING  2  . metoprolol succinate (TOPROL-XL) 50 MG 24 hr tablet Take 1 and 1/2 tablet (75mg ) by mouth twice a day 90 tablet 3  . pregabalin (LYRICA) 50 MG capsule Take 100 mg by mouth.    . Vitamin D, Ergocalciferol, (DRISDOL) 50000 UNITS CAPS Take 50,000 Units by mouth every 7 (seven) days. Takes on Thursday    . XARELTO 15 MG TABS tablet Take 15 mg by mouth daily.     No current facility-administered medications for this visit.     Allergies:   Albuterol; Epinephrine; Penicillins; Bee venom; and Ivp dye [iodinated diagnostic agents]    Social History:  The patient  reports that  has never smoked. she has never used smokeless tobacco. She reports that she does not drink alcohol or use drugs.   Family History:  The patient's family history includes Atrial fibrillation in her father; CAD in her brother and sister; Congestive Heart Failure in her mother; Heart failure in her mother; Hypertension in her brother,  father, mother, sister, and son; Stroke in her father.    ROS:    Physical Exam: Blood pressure 110/66, pulse 82, height 5\' 9"  (1.753 m), weight 275 lb (124.7 kg).  GEN:   Middle age female, obese HEENT: Normal NECK: No JVD; No carotid bruits LYMPHATICS: No lymphadenopathy CARDIAC: Irreg, no murmurs, rubs, gallops RESPIRATORY:  Clear to auscultation without rales, wheezing or rhonchi  ABDOMEN: Soft, non-tender, non-distended MUSCULOSKELETAL:  No edema; No deformity  SKIN: Warm and dry NEUROLOGIC:  Alert and oriented x 3   EKG:   February 25, 2017: Atrial fibrillation at a rate of 79.  Occasional premature ventricular contractions versus apparently conducted beats.  QTC is 465 ms.  Recent Labs: 07/29/2016: TSH 4.490 02/18/2017: B Natriuretic Peptide 300.8; BUN 36; Creatinine, Ser 1.90; Hemoglobin 13.0; Platelets 147; Potassium 3.7; Sodium 136    Lipid Panel No results found for: CHOL, TRIG, HDL, CHOLHDL, VLDL, LDLCALC, LDLDIRECT    Wt Readings from Last 3 Encounters:  02/25/17 275 lb (124.7 kg)  02/18/17 284 lb (128.8 kg)  02/09/17 279 lb (126.6 kg)      Other studies Reviewed: Additional studies/ records that were reviewed today include: . Review of the above records demonstrates:    ASSESSMENT AND PLAN:  1. Atrial fibrillation -      She is now off amiodarone.  The strategy is now for anticoagulation and rate control.  She is overall feeling much better.  2. Diabetes mellitus  3. Chronic diastolic CHF -she had an episode of heart failure last week.  I suspect that she ate a bit too much salt.  She took an extra Lasix which resulted in a good diuresis and resolution of her symptoms.  Advised her to avoid eating any extra salt.  4. Obesity.     Advised her to work on weight loss program.  5. Insomnia:       Current medicines are reviewed at length with the patient today.  The patient does not have concerns regarding  medicines.  The following changes have been  made:  no change  Labs/ tests ordered today include:   Orders Placed This Encounter  Procedures  . Lipid Profile  . Basic Metabolic Panel (BMET)  . Hepatic function panel  . EKG 12-Lead    Disposition:   FU with me in 6 months      Signed, Kristine Moores, MD  02/25/2017 10:11 AM    [facility] [place], [place], [place]  [address] Phone: [phone]; Fax: [phone]

## 2017-02-25 NOTE — Patient Instructions (Signed)

## 2017-03-03 ENCOUNTER — Other Ambulatory Visit: Payer: Self-pay | Admitting: *Deleted

## 2017-03-03 ENCOUNTER — Other Ambulatory Visit: Payer: Self-pay

## 2017-03-03 DIAGNOSIS — I4819 Other persistent atrial fibrillation: Secondary | ICD-10-CM

## 2017-03-03 MED ORDER — METOPROLOL SUCCINATE ER 50 MG PO TB24: ORAL_TABLET | ORAL | 3 refills | Status: DC

## 2017-03-09 ENCOUNTER — Ambulatory Visit: Payer: Medicare HMO | Attending: Neurology | Admitting: Physical Therapy

## 2017-03-09 ENCOUNTER — Encounter: Payer: Self-pay | Admitting: Physical Therapy

## 2017-03-09 DIAGNOSIS — R262 Difficulty in walking, not elsewhere classified: Secondary | ICD-10-CM | POA: Insufficient documentation

## 2017-03-09 DIAGNOSIS — M6281 Muscle weakness (generalized): Secondary | ICD-10-CM | POA: Diagnosis present

## 2017-03-09 DIAGNOSIS — R2689 Other abnormalities of gait and mobility: Secondary | ICD-10-CM | POA: Diagnosis present

## 2017-03-09 NOTE — Therapy (Signed)
[facility] [place], [place], [address] Phone: [phone]   Fax:  [phone]  Physical Therapy Evaluation  Patient Details  Name: Kristine [name] MRN: [id number] Date of Birth: [birth year] Referring Provider: Marcial Pacas,    Encounter Date: 03/09/2017  PT End of Session - 03/09/17 1353    Visit Number  1    Number of Visits  16    Date for PT Re-Evaluation  05/10/17    Authorization Type  medicare, G-codes and KX modifier after visit 15    PT Start Time  1300    PT Stop Time  1340    PT Time Calculation (min)  40 min    Activity Tolerance  Patient tolerated treatment well    Behavior During Therapy  [facility] for tasks assessed/performed       Past Medical History:  Diagnosis Date  . Anxiety   . Asthma   . Atrial fibrillation ([place])   . CHF (congestive heart failure) ([place])   . Chronic bronchitis ([place])   . GERD (gastroesophageal reflux disease)   . High cholesterol   . History of hiatal hernia   . HTN (hypertension)   . Mild aortic sclerosis ([place])   . Morbid obesity ([place])   . Osteoporosis   . Persistent atrial fibrillation ([place])   . Scoliosis   . Spinal stenosis   . Type II diabetes mellitus ([place])   . Vitamin D deficiency     Past Surgical History:  Procedure Laterality Date  . BLADDER SUSPENSION  1980s  . CARDIAC CATHETERIZATION  11/16/09   SMOOTH AND NORMAL  . CARDIOVERSION N/A 01/07/2015   Procedure: CARDIOVERSION;  Surgeon: Skeet Latch, MD;  Location: [facility] ENDOSCOPY;  Service: Cardiovascular;  Laterality: N/A;  . CARDIOVERSION N/A 03/11/2015   Procedure: CARDIOVERSION;  Surgeon: Skeet Latch, MD;  Location: [place];  Service: Cardiovascular;  Laterality: N/A;  . CARPAL TUNNEL RELEASE Left 1970s  . CATARACT EXTRACTION W/ INTRAOCULAR LENS  IMPLANT, BILATERAL Bilateral ~ 2010  . COLONOSCOPY  08/14/08  . FINGER FRACTURE SURGERY Right 1970s   "ring finger"  . FRACTURE SURGERY    . LAPAROSCOPIC  CHOLECYSTECTOMY  1980s  . TUBAL LIGATION    . VAGINAL HYSTERECTOMY  1980    There were no vitals filed for this visit.   Subjective Assessment - 03/09/17 1311    Subjective  Pt arriving to therapy reporting bilateral foot pain that has been going on for over a year. Pt reporting unsteadiness with walking and fear of falling. Pt reporting pain of 10/10 in her feet. Pt having difficulty sleeping at night due to pain.     Currently in Pain?  Yes    Pain Score  10-Worst pain ever    Pain Location  Foot    Pain Orientation  Right;Left    Pain Descriptors / Indicators  Burning;Stabbing    Pain Onset  More than a month ago    Pain Frequency  Constant    Aggravating Factors   everything    Pain Relieving Factors  biofreeze at times, Lyrica    Effect of Pain on Daily Activities  difficutly sleeping and walking,          [facility] PT Assessment - 03/09/17 0001      Assessment   Medical Diagnosis  gait abnormality, bilateral peripheral neuropathy    Referring Provider  Marcial Pacas,     Hand Dominance  Right    Prior Therapy  yes, years  ago for shoulder      Precautions   Precautions  Fall    Required Braces or Orthoses  -- left heel lift      Restrictions   Weight Bearing Restrictions  No      Balance Screen   Has the patient fallen in the past 6 months  No pt reporting fall down 2 steps in the last year    Has the patient had a decrease in activity level because of a fear of falling?   Yes    Is the patient reluctant to leave their home because of a fear of falling?   Yes      [place] residence    Type of [place] Access  Level entry      Prior Function   Level of [place]  Retired      Associate Professor   Overall Cognitive Status  Within Functional Limits for tasks assessed      Posture/Postural Control   Posture/Postural Control  Postural limitations    Postural Limitations  Rounded Shoulders;Forward  head;Increased thoracic kyphosis      ROM / Strength   AROM / PROM / Strength  Strength      Strength   Strength Assessment Site  Hip;Knee;Ankle    Right/Left Hip  Right;Left    Right Hip Flexion  3+/5    Right Hip Extension  3/5    Right Hip ABduction  3+/5    Right Hip ADduction  3+/5    Left Hip Flexion  3+/5    Left Hip Extension  3/5    Left Hip ABduction  3+/5    Left Hip ADduction  3+/5    Right/Left Knee  Right;Left    Right Knee Flexion  4/5    Right Knee Extension  4/5    Left Knee Flexion  4/5    Left Knee Extension  4/5    Right/Left Ankle  Right;Left    Right Ankle Dorsiflexion  4-/5    Right Ankle Plantar Flexion  4-/5    Left Ankle Dorsiflexion  4-/5    Left Ankle Plantar Flexion  4-/5      Ambulation/Gait   Gait Comments  Pt amb with wide base of support with heel lift in left shoe. Pt with decreased arm swing bilaterally and decreased foot cleanance and dorsiflexion.       Balance   Balance Assessed  Yes      Standardized Balance Assessment   Standardized Balance Assessment  Berg Balance Test      Berg Balance Test   Sit to Stand  Able to stand without using hands and stabilize independently    Standing Unsupported  Able to stand safely 2 minutes    Sitting with Back Unsupported but Feet Supported on Floor or Stool  Able to sit safely and securely 2 minutes    Stand to Sit  Sits safely with minimal use of hands    Transfers  Able to transfer safely, minor use of hands    Standing Unsupported with Eyes Closed  Able to stand 10 seconds with supervision    Standing Ubsupported with Feet Together  Able to place feet together independently and stand for 1 minute with supervision    From Standing, Reach Forward with Outstretched Arm  Can reach forward >12 cm safely (5")    From Standing Position, Pick up  Object from Floor  Unable to pick up shoe, but reaches 2-5 cm (1-2") from shoe and balances independently    From Standing Position, Turn to Look Behind Over  each Shoulder  Turn sideways only but maintains balance    Turn 360 Degrees  Able to turn 360 degrees safely but slowly    Standing Unsupported, Alternately Place Feet on Step/Stool  Able to complete 4 steps without aid or supervision    Standing Unsupported, One Foot in Front  Able to take small step independently and hold 30 seconds    Standing on One Leg  Tries to lift leg/unable to hold 3 seconds but remains standing independently    Total Score  40             Objective measurements completed on examination: See above findings.                   PT Long Term Goals - 03/09/17 1410      PT LONG TERM GOAL #1   Title  pt will be independent in her HEP and progression.     Time  8    Period  Weeks    Status  New    Target Date  05/04/17      PT LONG TERM GOAL #2   Title  Pt will be able to improve her BERG balance score from 40/56 to >/= 48/56.     Time  8    Period  Weeks    Status  New    Target Date  05/04/17      PT LONG TERM GOAL #3   Title  Pt will improve her bilatera LE strength to grossly 4/5 in order to improve functional mobility.     Time  8    Period  Weeks    Status  New    Target Date  05/04/17      PT LONG TERM GOAL #4   Title  Pt will report pain less than/ equal to 7/10 with ADL's.     Time  8    Period  Weeks    Status  New    Target Date  05/04/17             Plan - 03/09/17 1355    Clinical Impression Statement  Pt arriving to therapy reporting 10/10 bilateral foot pain secondary to peripheral neuropathy. Pt reporting fear of fall and in the last year reporting a fall down 2 steps. Pt reporting peripheral neuropathy in bilatera hands and she is having difficutly playing the piano at church with her feet and hands. Pt also reporting difficulty in her shower having to rely on her grab bars to prevent falls. Pt scored a 40/56 on her BERG balance test. Pt with weakness in bilateral LE's. Pt could benefit from skilled PT to  progress pt's balance and strength and improve pt's functional mobility.     History and Personal Factors relevant to plan of care:  h/o falls, peripheral neuropathy in bilateral hands and feet, A-fib, spinal stenosis, sciolosis, obesity, DM2, Osteoprosis    Clinical Presentation  Evolving    Clinical Presentation due to:  worsening peripheral neuropathy    Rehab Potential  Good    PT Frequency  2x / week    PT Duration  8 weeks    PT Treatment/Interventions  ADLs/Self Care Home Management;Electrical Stimulation;Ultrasound;Gait training;Stair training;Functional mobility training;Therapeutic activities;Therapeutic exercise;Balance training;Neuromuscular re-education;Patient/family education;Manual techniques;Moist Heat;Passive range of motion  PT Next Visit Plan  Nustep, balance exercises, LE strengthening, E-stim/heat to bilateral feet as appropriate    Consulted and Agree with Plan of Care  Patient       Patient will benefit from skilled therapeutic intervention in order to improve the following deficits and impairments:  Abnormal gait, Postural dysfunction, Pain, Decreased activity tolerance, Decreased balance, Difficulty walking, Decreased strength, Obesity  Visit Diagnosis: Other abnormalities of gait and mobility  Difficulty in walking, not elsewhere classified  Muscle weakness (generalized)  G-Codes - March 29, 2017 1414    Functional Assessment Tool Used (Outpatient Only)  BERG score 40/56 (71% limitation), clinical assessment    Functional Limitation  Mobility: Walking and moving around    Mobility: Walking and Moving Around Current Status [phone])  At least 60 percent but less than 80 percent impaired, limited or restricted    Mobility: Walking and Moving Around Goal Status [phone])  At least 40 percent but less than 60 percent impaired, limited or restricted        Problem List Patient Active Problem List   Diagnosis Date Noted  . Gait abnormality 02/09/2017  . Obstructive  sleep apnea 02/09/2017  . Diabetic peripheral neuropathy ([place]) 09/02/2016  . Peripheral neuropathy 07/29/2016  . Spinal stenosis of lumbar region 07/29/2016  . Excessive daytime sleepiness 07/29/2016  . Morbid obesity ([place]) 07/29/2016  . 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 Community acquired pneumonia 05/31/2015  . CAP (community acquired pneumonia) 05/31/2015  . Anemia 05/31/2015  . Diastolic CHF, chronic ([place]) 05/31/2015  . Paroxysmal atrial fibrillation (HCC)   . Obesity 10/15/2013  . Diastolic dysfunction with chronic heart failure ([place]) 06/23/2013  . Atrial fibrillation ([place]) 06/20/2010  . Pneumonia   . Abnormal cardiovascular function study   . High cholesterol   . Scoliosis   . Spinal stenosis   . Anxiety   . Osteoarthritis   . Osteoporosis   . Mild aortic sclerosis ([place])   . Degenerative disc disease   . Vitamin D deficiency   . HEMOPTYSIS UNSPECIFIED 05/12/2010  . Diabetes mellitus, type 2 ([place]) 11/19/2009  . Hypertensive cardiovascular disease 11/19/2009  . DYSPNEA ON EXERTION 11/19/2009    Oretha Caprice , MPT 2017/03/29, 2:18 PM  [facility] [address] [place], [place], [address] Phone: [phone]   Fax:  [phone]  Name: Kristine [name] MRN: [id number] Date of Birth: 04/22/1945

## 2017-03-10 ENCOUNTER — Encounter: Payer: Self-pay | Admitting: Physical Therapy

## 2017-03-10 ENCOUNTER — Ambulatory Visit: Payer: Medicare HMO | Admitting: Physical Therapy

## 2017-03-10 DIAGNOSIS — R2689 Other abnormalities of gait and mobility: Secondary | ICD-10-CM | POA: Diagnosis not present

## 2017-03-10 DIAGNOSIS — R262 Difficulty in walking, not elsewhere classified: Secondary | ICD-10-CM

## 2017-03-10 DIAGNOSIS — M6281 Muscle weakness (generalized): Secondary | ICD-10-CM

## 2017-03-10 NOTE — Therapy (Signed)
[facility] [place], [place], [address] Phone: [phone]   Fax:  [phone]  Physical Therapy Treatment  Patient Details  Name: Kristine [name] MRN: [id number] Date of Birth: 07-09-1945 Referring Provider: Marcial Pacas,    Encounter Date: 03/10/2017  PT End of Session - 03/10/17 1040    Visit Number  2    Number of Visits  16    Date for PT Re-Evaluation  05/10/17    Authorization Type  medicare, G-codes and KX modifier after visit 15    PT Start Time  1037    PT Stop Time  1113 2 units secondary to rest breaks, restroom breaks required during treatment    PT Time Calculation (min)  36 min    Activity Tolerance  Patient tolerated treatment well    Behavior During Therapy  [facility] for tasks assessed/performed       Past Medical History:  Diagnosis Date  . Anxiety   . Asthma   . Atrial fibrillation ([place])   . CHF (congestive heart failure) ([place])   . Chronic bronchitis ([place])   . GERD (gastroesophageal reflux disease)   . High cholesterol   . History of hiatal hernia   . HTN (hypertension)   . Mild aortic sclerosis ([place])   . Morbid obesity ([place])   . Osteoporosis   . Persistent atrial fibrillation ([place])   . Scoliosis   . Spinal stenosis   . Type II diabetes mellitus ([place])   . Vitamin D deficiency     Past Surgical History:  Procedure Laterality Date  . BLADDER SUSPENSION  1980s  . CARDIAC CATHETERIZATION  11/16/09   SMOOTH AND NORMAL  . CARDIOVERSION N/A 01/07/2015   Procedure: CARDIOVERSION;  Surgeon: Skeet Latch, MD;  Location: [facility] ENDOSCOPY;  Service: Cardiovascular;  Laterality: N/A;  . CARDIOVERSION N/A 03/11/2015   Procedure: CARDIOVERSION;  Surgeon: Skeet Latch, MD;  Location: [facility];  Service: Cardiovascular;  Laterality: N/A;  . CARPAL TUNNEL RELEASE Left 1970s  . CATARACT EXTRACTION W/ INTRAOCULAR LENS  IMPLANT, BILATERAL Bilateral ~ 2010  . COLONOSCOPY  08/14/08  . FINGER FRACTURE  SURGERY Right 1970s   "ring finger"  . FRACTURE SURGERY    . LAPAROSCOPIC CHOLECYSTECTOMY  1980s  . TUBAL LIGATION    . VAGINAL HYSTERECTOMY  1980    There were no vitals filed for this visit.  Subjective Assessment - 03/10/17 1037    Subjective  Patient arrived with reports of weakness.    Currently in Pain?  Yes    Pain Score  10-Worst pain ever    Pain Location  Foot    Pain Orientation  Right;Left    Pain Descriptors / Indicators  Burning;Tingling;Shooting;Stabbing    Pain Onset  More than a month ago    Aggravating Factors   Lying down    Pain Relieving Factors  Biofreeze, Lyrica         [facility] PT Assessment - 03/10/17 0001      Assessment   Medical Diagnosis  gait abnormality, bilateral peripheral neuropathy    Hand Dominance  Right    Prior Therapy  yes, years ago for shoulder      Precautions   Precautions  Fall      Restrictions   Weight Bearing Restrictions  No                  [facility] Adult PT Treatment/Exercise - 03/10/17 0001      Exercises   Exercises  Knee/Hip      Knee/Hip Exercises: Aerobic   Nustep  L4 x15 min      Knee/Hip Exercises: Standing   Heel Raises  Both;2 sets;10 reps    Heel Raises Limitations  B toe raise x20 reps    Hip Flexion  AROM;Both;20 reps;Knee bent    Hip Abduction  AROM;Both;2 sets;Knee straight          Balance Exercises - 03/10/17 1100      Balance Exercises: Standing   Standing Eyes Opened  Narrow base of support (BOS);Foam/compliant surface x3 min    Standing Eyes Closed  Narrow base of support (BOS);Solid surface x2 min    Tandem Stance  Eyes open;Intermittent upper extremity support x1 min    Step Ups  Forward;6 inch;UE support 2 x15 reps    Sit to Stand Time  x10 reps without UE assist             PT Long Term Goals - 03/09/17 1410      PT LONG TERM GOAL #1   Title  pt will be independent in her HEP and progression.     Time  8    Period  Weeks    Status  New    Target Date   05/04/17      PT LONG TERM GOAL #2   Title  Pt will be able to improve her BERG balance score from 40/56 to >/= 48/56.     Time  8    Period  Weeks    Status  New    Target Date  05/04/17      PT LONG TERM GOAL #3   Title  Pt will improve her bilatera LE strength to grossly 4/5 in order to improve functional mobility.     Time  8    Period  Weeks    Status  New    Target Date  05/04/17      PT LONG TERM GOAL #4   Title  Pt will report pain less than/ equal to 7/10 with ADL's.     Time  8    Period  Weeks    Status  New    Target Date  05/04/17            Plan - 03/10/17 1121    Clinical Impression Statement  Patient tolerated today's treatment fair as she continues to have B foot burning and tingling. Patient also reported lack of activity and acknowledged weakness in both UEs and LEs. Patient also reported and experiences a great fear of falling and has limited her to the bottom floor of her home due to fear of falling. Patient guided through initial LE strengthening with Nustep and standing exericses. Patient experienced increased fatigue with nustep as well as following standing exercises. Patient required seated rest breaks especially towards the end of the PT session due to fatigue and LBP. Patient also educated regarding MPT's POC regarding strengthening and balance activities. PT treatment ended due to patient's fatigue and SOB.    Rehab Potential  Good    PT Frequency  2x / week    PT Duration  8 weeks    PT Treatment/Interventions  ADLs/Self Care Home Management;Electrical Stimulation;Ultrasound;Gait training;Stair training;Functional mobility training;Therapeutic activities;Therapeutic exercise;Balance training;Neuromuscular re-education;Patient/family education;Manual techniques;Moist Heat;Passive range of motion    PT Next Visit Plan  Nustep, balance exercises, LE strengthening, E-stim/heat to bilateral feet as appropriate    Consulted and Agree with Plan of Care  Patient       Patient will benefit from skilled therapeutic intervention in order to improve the following deficits and impairments:  Abnormal gait, Postural dysfunction, Pain, Decreased activity tolerance, Decreased balance, Difficulty walking, Decreased strength, Obesity  Visit Diagnosis: Other abnormalities of gait and mobility  Difficulty in walking, not elsewhere classified  Muscle weakness (generalized)   G-Codes - April 05, 2017 1414    Functional Assessment Tool Used (Outpatient Only)  BERG score 40/56 (71% limitation), clinical assessment    Functional Limitation  Mobility: Walking and moving around    Mobility: Walking and Moving Around Current Status [phone])  At least 60 percent but less than 80 percent impaired, limited or restricted    Mobility: Walking and Moving Around Goal Status [phone])  At least 40 percent but less than 60 percent impaired, limited or restricted       Problem List Patient Active Problem List   Diagnosis Date Noted  . Gait abnormality 02/09/2017  . Obstructive sleep apnea 02/09/2017  . Diabetic peripheral neuropathy ([place]) 09/02/2016  . Peripheral neuropathy 07/29/2016  . Spinal stenosis of lumbar region 07/29/2016  . Excessive daytime sleepiness 07/29/2016  . Morbid obesity ([place]) 07/29/2016  . 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 Community acquired pneumonia 05/31/2015  . CAP (community acquired pneumonia) 05/31/2015  . Anemia 05/31/2015  . Diastolic CHF, chronic ([place]) 05/31/2015  . Paroxysmal atrial fibrillation (H[facility])   . Obesity 10/15/2013  . Diastolic dysfunction with chronic heart failure ([place]) 06/23/2013  . Atrial fibrillation ([place]) 06/20/2010  . Pneumonia   . Abnormal cardiovascular function study   . High cholesterol   . Scoliosis   . Spinal stenosis   . Anxiety   . Osteoarthritis   . Osteoporosis   . Mild aortic sclerosis ([place])   . Degenerative disc disease   . Vitamin D deficiency   . HEMOPTYSIS UNSPECIFIED 05/12/2010  . Diabetes  mellitus, type 2 ([place]) 11/19/2009  . Hypertensive cardiovascular disease 11/19/2009  . DYSPNEA ON EXERTION 11/19/2009    Standley Brooking, PTA 03/10/2017, 11:42 AM  [facility] [address] [place], [place], [address] Phone: [phone]   Fax:  [phone]  Name: Kristine [name] MRN: [id number] Date of Birth: 1946-02-21

## 2017-03-17 ENCOUNTER — Encounter: Payer: Self-pay | Admitting: Physical Therapy

## 2017-03-17 ENCOUNTER — Ambulatory Visit: Payer: Medicare HMO | Admitting: Physical Therapy

## 2017-03-17 DIAGNOSIS — R262 Difficulty in walking, not elsewhere classified: Secondary | ICD-10-CM

## 2017-03-17 DIAGNOSIS — R2689 Other abnormalities of gait and mobility: Secondary | ICD-10-CM

## 2017-03-17 DIAGNOSIS — M6281 Muscle weakness (generalized): Secondary | ICD-10-CM

## 2017-03-17 NOTE — Therapy (Signed)
[facility] [place], [place], [address] Phone: [phone]   Fax:  [phone]  Physical Therapy Treatment  Patient Details  Name: Kristine [name] MRN: [id number] Date of Birth: Sep 25, 1945 Referring Provider: Marcial Pacas,    Encounter Date: 03/17/2017  PT End of Session - 03/17/17 1059    Visit Number  3    Number of Visits  16    Date for PT Re-Evaluation  05/10/17    Authorization Type  medicare, G-codes and KX modifier after visit 15    PT Start Time  1050    PT Stop Time  1115    PT Time Calculation (min)  25 min    Activity Tolerance  Patient tolerated treatment well    Behavior During Therapy  [facility] for tasks assessed/performed       Past Medical History:  Diagnosis Date  . Anxiety   . Asthma   . Atrial fibrillation ([place])   . CHF (congestive heart failure) ([place])   . Chronic bronchitis ([place])   . GERD (gastroesophageal reflux disease)   . High cholesterol   . History of hiatal hernia   . HTN (hypertension)   . Mild aortic sclerosis ([place])   . Morbid obesity ([place])   . Osteoporosis   . Persistent atrial fibrillation ([place])   . Scoliosis   . Spinal stenosis   . Type II diabetes mellitus ([place])   . Vitamin D deficiency     Past Surgical History:  Procedure Laterality Date  . BLADDER SUSPENSION  1980s  . CARDIAC CATHETERIZATION  11/16/09   SMOOTH AND NORMAL  . CARDIOVERSION N/A 01/07/2015   Procedure: CARDIOVERSION;  Surgeon: Skeet Latch, MD;  Location: [facility] ENDOSCOPY;  Service: Cardiovascular;  Laterality: N/A;  . CARDIOVERSION N/A 03/11/2015   Procedure: CARDIOVERSION;  Surgeon: Skeet Latch, MD;  Location: [place];  Service: Cardiovascular;  Laterality: N/A;  . CARPAL TUNNEL RELEASE Left 1970s  . CATARACT EXTRACTION W/ INTRAOCULAR LENS  IMPLANT, BILATERAL Bilateral ~ 2010  . COLONOSCOPY  08/14/08  . FINGER FRACTURE SURGERY Right 1970s   "ring finger"  . FRACTURE SURGERY    . LAPAROSCOPIC  CHOLECYSTECTOMY  1980s  . TUBAL LIGATION    . VAGINAL HYSTERECTOMY  1980    There were no vitals filed for this visit.  Subjective Assessment - 03/17/17 1051    Subjective  Reports that she woke late and tired from Christmas. Reports that if she takes Lyrica then the stabbing pain in her feet is not as bad. Reports that she experienced increased stabbing pain yesterday but was on her feet a lot yesterday.    Currently in Pain?  Yes    Pain Score  4     Pain Location  Leg    Pain Orientation  Left;Upper    Pain Descriptors / Indicators  Sore    Pain Onset  More than a month ago    Pain Frequency  Constant         [facility] PT Assessment - 03/17/17 0001      Assessment   Medical Diagnosis  gait abnormality, bilateral peripheral neuropathy    Hand Dominance  Right    Prior Therapy  yes, years ago for shoulder      Precautions   Precautions  Fall      Restrictions   Weight Bearing Restrictions  No                  [facility] Adult PT  Treatment/Exercise - 03/17/17 0001      Ambulation/Gait   Ambulation/Gait  Yes    Ambulation/Gait Assistance  6: Modified independent (Device/Increase time)    Ambulation Distance (Feet)  45 Feet    Assistive device  Straight cane    Gait Pattern  Step-through pattern;Decreased arm swing - left;Decreased trunk rotation;Narrow base of support    Ambulation Surface  Level;Indoor    Gait Comments  Patient requested gait training with [facility] for safety.      Knee/Hip Exercises: Aerobic   Nustep  L3 x11 min          Balance Exercises - 03/17/17 1101      Balance Exercises: Standing   Standing Eyes Opened  Narrow base of support (BOS);Foam/compliant surface intermittant UE support x3 min    Heel Raises Limitations  x15 reps    Toe Raise Limitations  x15 reps    Sit to Stand Time  x 7 reps without UE assist    Other Standing Exercises  toe taps 6" step x20 reps, B hip abduction x15 reps for SLLS             PT Long Term Goals -  03/09/17 1410      PT LONG TERM GOAL #1   Title  pt will be independent in her HEP and progression.     Time  8    Period  Weeks    Status  New    Target Date  05/04/17      PT LONG TERM GOAL #2   Title  Pt will be able to improve her BERG balance score from 40/56 to >/= 48/56.     Time  8    Period  Weeks    Status  New    Target Date  05/04/17      PT LONG TERM GOAL #3   Title  Pt will improve her bilatera LE strength to grossly 4/5 in order to improve functional mobility.     Time  8    Period  Weeks    Status  New    Target Date  05/04/17      PT LONG TERM GOAL #4   Title  Pt will report pain less than/ equal to 7/10 with ADL's.     Time  8    Period  Weeks    Status  New    Target Date  05/04/17            Plan - 03/17/17 1133    Clinical Impression Statement  Patient limited with treatment secondary to late arrival as well as fatigue. Patient had shoes donned today with L foot lift to assist with LBP. Patient limited with all exercises secondary to fatigue. Patient requested gait training with SPC in clinic to ensure proper gait sequence with AD in RUE. Patient also educated on how to fit height for patient correctly.    Rehab Potential  Good    PT Frequency  2x / week    PT Duration  8 weeks    PT Treatment/Interventions  ADLs/Self Care Home Management;Electrical Stimulation;Ultrasound;Gait training;Stair training;Functional mobility training;Therapeutic activities;Therapeutic exercise;Balance training;Neuromuscular re-education;Patient/family education;Manual techniques;Moist Heat;Passive range of motion    PT Next Visit Plan  Nustep, balance exercises, LE strengthening, E-stim/heat to bilateral feet as appropriate    Consulted and Agree with Plan of Care  Patient       Patient will benefit from skilled therapeutic intervention in order to improve the following  deficits and impairments:  Abnormal gait, Postural dysfunction, Pain, Decreased activity tolerance,  Decreased balance, Difficulty walking, Decreased strength, Obesity  Visit Diagnosis: Other abnormalities of gait and mobility  Difficulty in walking, not elsewhere classified  Muscle weakness (generalized)     Problem List Patient Active Problem List   Diagnosis Date Noted  . Gait abnormality 02/09/2017  . Obstructive sleep apnea 02/09/2017  . Diabetic peripheral neuropathy ([place]) 09/02/2016  . Peripheral neuropathy 07/29/2016  . Spinal stenosis of lumbar region 07/29/2016  . Excessive daytime sleepiness 07/29/2016  . Morbid obesity ([place]) 07/29/2016  . 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 Community acquired pneumonia 05/31/2015  . CAP (community acquired pneumonia) 05/31/2015  . Anemia 05/31/2015  . Diastolic CHF, chronic ([place]) 05/31/2015  . Paroxysmal atrial fibrillation (H[facility])   . Obesity 10/15/2013  . Diastolic dysfunction with chronic heart failure ([place]) 06/23/2013  . Atrial fibrillation ([place]) 06/20/2010  . Pneumonia   . Abnormal cardiovascular function study   . High cholesterol   . Scoliosis   . Spinal stenosis   . Anxiety   . Osteoarthritis   . Osteoporosis   . Mild aortic sclerosis ([place])   . Degenerative disc disease   . Vitamin D deficiency   . HEMOPTYSIS UNSPECIFIED 05/12/2010  . Diabetes mellitus, type 2 ([place]) 11/19/2009  . Hypertensive cardiovascular disease 11/19/2009  . DYSPNEA ON EXERTION 11/19/2009    Standley Brooking, PTA 03/17/2017, 11:36 AM  [facility] [address] [place], [place], [address] Phone: [phone]   Fax:  [phone]  Name: KEYMANI GLYNN MRN: [id number] Date of Birth: [birth year]

## 2017-03-24 ENCOUNTER — Encounter: Payer: Self-pay | Admitting: Physical Therapy

## 2017-03-24 ENCOUNTER — Ambulatory Visit: Payer: Medicare HMO | Attending: Neurology | Admitting: Physical Therapy

## 2017-03-24 DIAGNOSIS — M6281 Muscle weakness (generalized): Secondary | ICD-10-CM

## 2017-03-24 DIAGNOSIS — R2689 Other abnormalities of gait and mobility: Secondary | ICD-10-CM | POA: Diagnosis not present

## 2017-03-24 DIAGNOSIS — R262 Difficulty in walking, not elsewhere classified: Secondary | ICD-10-CM | POA: Insufficient documentation

## 2017-03-24 NOTE — Therapy (Signed)
[facility], [place], [address] Phone: [phone]   Fax:  [phone]  Physical Therapy Treatment  Patient Details  Name: Kristine [name] MRN: [id number] Date of Birth: 1946-02-22 Referring Provider: Marcial Pacas,    Encounter Date: 03/24/2017  PT End of Session - 03/24/17 1031    Visit Number  4    Number of Visits  16    Date for PT Re-Evaluation  05/10/17    Authorization Type  medicare, G-codes and KX modifier after visit 15    PT Start Time  1031    PT Stop Time  1051    PT Time Calculation (min)  20 min    Activity Tolerance  Patient limited by fatigue    Behavior During Therapy  [facility] for tasks assessed/performed       Past Medical History:  Diagnosis Date  . Anxiety   . Asthma   . Atrial fibrillation ([place])   . CHF (congestive heart failure) ([place])   . Chronic bronchitis ([place])   . GERD (gastroesophageal reflux disease)   . High cholesterol   . History of hiatal hernia   . HTN (hypertension)   . Mild aortic sclerosis ([place])   . Morbid obesity ([place])   . Osteoporosis   . Persistent atrial fibrillation ([place])   . Scoliosis   . Spinal stenosis   . Type II diabetes mellitus ([place])   . Vitamin D deficiency     Past Surgical History:  Procedure Laterality Date  . BLADDER SUSPENSION  1980s  . CARDIAC CATHETERIZATION  11/16/09   SMOOTH AND NORMAL  . CARDIOVERSION N/A 01/07/2015   Procedure: CARDIOVERSION;  Surgeon: Skeet Latch, MD;  Location: [facility] ENDOSCOPY;  Service: Cardiovascular;  Laterality: N/A;  . CARDIOVERSION N/A 03/11/2015   Procedure: CARDIOVERSION;  Surgeon: Skeet Latch, MD;  Location: [facility];  Service: Cardiovascular;  Laterality: N/A;  . CARPAL TUNNEL RELEASE Left 1970s  . CATARACT EXTRACTION W/ INTRAOCULAR LENS  IMPLANT, BILATERAL Bilateral ~ 2010  . COLONOSCOPY  08/14/08  . FINGER FRACTURE SURGERY Right 1970s   "ring finger"  . FRACTURE SURGERY    . LAPAROSCOPIC  CHOLECYSTECTOMY  1980s  . TUBAL LIGATION    . VAGINAL HYSTERECTOMY  1980    There were no vitals filed for this visit.  Subjective Assessment - 03/24/17 1041    Subjective  Reports that if she takes her medication she does not experience tingling in B feet.    Currently in Pain?  No/denies         [facility] PT Assessment - 03/24/17 0001      Assessment   Medical Diagnosis  gait abnormality, bilateral peripheral neuropathy    Hand Dominance  Right    Prior Therapy  yes, years ago for shoulder      Precautions   Precautions  Fall      Restrictions   Weight Bearing Restrictions  No                  [facility] Adult PT Treatment/Exercise - 03/24/17 0001      Knee/Hip Exercises: Aerobic   Nustep  L4 x10 min      Knee/Hip Exercises: Standing   Heel Raises  Both;2 sets;10 reps    Hip Abduction  AROM;Both;2 sets;Knee straight    Forward Step Up  Both;10 reps;Hand Hold: 2;Step Height: 6"      Knee/Hip Exercises: Seated   Long Arc Quad  Strengthening;Both;20 reps;Weights  Long Arc Quad Weight  4 lbs.    Sit to [place]  15 reps;without UE support                  PT Long Term Goals - 03/09/17 1410      PT LONG TERM GOAL #1   Title  pt will be independent in her HEP and progression.     Time  8    Period  Weeks    Status  New    Target Date  05/04/17      PT LONG TERM GOAL #2   Title  Pt will be able to improve her BERG balance score from 40/56 to >/= 48/56.     Time  8    Period  Weeks    Status  New    Target Date  05/04/17      PT LONG TERM GOAL #3   Title  Pt will improve her bilatera LE strength to grossly 4/5 in order to improve functional mobility.     Time  8    Period  Weeks    Status  New    Target Date  05/04/17      PT LONG TERM GOAL #4   Title  Pt will report pain less than/ equal to 7/10 with ADL's.     Time  8    Period  Weeks    Status  New    Target Date  05/04/17            Plan - 03/24/17 1058    Clinical Impression  Statement  Patient limited with treatment today as she stated that she felt very fatigued and thought it may be from eating oatmeal then taking her insulin. Patient experienced feeling as if her sugar was dropping during standing exercises and sat down and requested to end treatment early.     Rehab Potential  Good    PT Frequency  2x / week    PT Duration  8 weeks    PT Treatment/Interventions  ADLs/Self Care Home Management;Electrical Stimulation;Ultrasound;Gait training;Stair training;Functional mobility training;Therapeutic activities;Therapeutic exercise;Balance training;Neuromuscular re-education;Patient/family education;Manual techniques;Moist Heat;Passive range of motion    PT Next Visit Plan  Nustep, balance exercises, LE strengthening, E-stim/heat to bilateral feet as appropriate    Consulted and Agree with Plan of Care  Patient       Patient will benefit from skilled therapeutic intervention in order to improve the following deficits and impairments:  Abnormal gait, Postural dysfunction, Pain, Decreased activity tolerance, Decreased balance, Difficulty walking, Decreased strength, Obesity  Visit Diagnosis: Other abnormalities of gait and mobility  Difficulty in walking, not elsewhere classified  Muscle weakness (generalized)     Problem List Patient Active Problem List   Diagnosis Date Noted  . Gait abnormality 02/09/2017  . Obstructive sleep apnea 02/09/2017  . Diabetic peripheral neuropathy ([place]) 09/02/2016  . Peripheral neuropathy 07/29/2016  . Spinal stenosis of lumbar region 07/29/2016  . Excessive daytime sleepiness 07/29/2016  . Morbid obesity ([place]) 07/29/2016  . 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 Community acquired pneumonia 05/31/2015  . CAP (community acquired pneumonia) 05/31/2015  . Anemia 05/31/2015  . Diastolic CHF, chronic ([place]) 05/31/2015  . Paroxysmal atrial fibrillation (H[facility])   . Obesity 10/15/2013  . Diastolic dysfunction with chronic heart failure  ([place]) 06/23/2013  . Atrial fibrillation ([place]) 06/20/2010  . Pneumonia   . Abnormal cardiovascular function study   . High cholesterol   . Scoliosis   . Spinal stenosis   .  Anxiety   . Osteoarthritis   . Osteoporosis   . Mild aortic sclerosis ([place])   . Degenerative disc disease   . Vitamin D deficiency   . HEMOPTYSIS UNSPECIFIED 05/12/2010  . Diabetes mellitus, type 2 ([place]) 11/19/2009  . Hypertensive cardiovascular disease 11/19/2009  . DYSPNEA ON EXERTION 11/19/2009    Standley Brooking, PTA 03/24/2017, 11:06 AM  [facility] [address] [place], [place], [address] Phone: [phone]   Fax:  [phone]  Name: SAHARRA SANTO MRN: [id number] Date of Birth: 1945/06/27

## 2017-03-26 ENCOUNTER — Ambulatory Visit: Payer: Medicare HMO | Admitting: Physical Therapy

## 2017-03-31 ENCOUNTER — Ambulatory Visit: Payer: Medicare HMO | Admitting: Physical Therapy

## 2017-03-31 ENCOUNTER — Encounter: Payer: Self-pay | Admitting: Physical Therapy

## 2017-03-31 DIAGNOSIS — R262 Difficulty in walking, not elsewhere classified: Secondary | ICD-10-CM

## 2017-03-31 DIAGNOSIS — M6281 Muscle weakness (generalized): Secondary | ICD-10-CM | POA: Diagnosis not present

## 2017-03-31 DIAGNOSIS — R2689 Other abnormalities of gait and mobility: Secondary | ICD-10-CM | POA: Diagnosis not present

## 2017-03-31 NOTE — Therapy (Signed)
[facility] [place], [place], [address] Phone: [phone]   Fax:  [phone]  Physical Therapy Treatment  Patient Details  Name: Kristine [name] MRN: [id number] Date of Birth: 06/24/1945 Referring Provider: Marcial Pacas,    Encounter Date: 03/31/2017  PT End of Session - 03/31/17 1101    Visit Number  5    Number of Visits  16    Date for PT Re-Evaluation  05/10/17    Authorization Type  medicare, G-codes and KX modifier after visit 15    PT Start Time  1030    PT Stop Time  1111    PT Time Calculation (min)  41 min    Activity Tolerance  Patient limited by fatigue;Patient tolerated treatment well    Behavior During Therapy  [facility] for tasks assessed/performed       Past Medical History:  Diagnosis Date  . Anxiety   . Asthma   . Atrial fibrillation ([place])   . CHF (congestive heart failure) ([place])   . Chronic bronchitis ([place])   . GERD (gastroesophageal reflux disease)   . High cholesterol   . History of hiatal hernia   . HTN (hypertension)   . Mild aortic sclerosis ([place])   . Morbid obesity ([place])   . Osteoporosis   . Persistent atrial fibrillation ([place])   . Scoliosis   . Spinal stenosis   . Type II diabetes mellitus ([place])   . Vitamin D deficiency     Past Surgical History:  Procedure Laterality Date  . BLADDER SUSPENSION  1980s  . CARDIAC CATHETERIZATION  11/16/09   SMOOTH AND NORMAL  . CARDIOVERSION N/A 01/07/2015   Procedure: CARDIOVERSION;  Surgeon: Skeet Latch, MD;  Location: [facility] ENDOSCOPY;  Service: Cardiovascular;  Laterality: N/A;  . CARDIOVERSION N/A 03/11/2015   Procedure: CARDIOVERSION;  Surgeon: Skeet Latch, MD;  Location: [place];  Service: Cardiovascular;  Laterality: N/A;  . CARPAL TUNNEL RELEASE Left 1970s  . CATARACT EXTRACTION W/ INTRAOCULAR LENS  IMPLANT, BILATERAL Bilateral ~ 2010  . COLONOSCOPY  08/14/08  . FINGER FRACTURE SURGERY Right 1970s   "ring finger"  . FRACTURE  SURGERY    . LAPAROSCOPIC CHOLECYSTECTOMY  1980s  . TUBAL LIGATION    . VAGINAL HYSTERECTOMY  1980    There were no vitals filed for this visit.  Subjective Assessment - 03/31/17 1039    Subjective  Patient reported doing well overall and no complaints after last treatment    Currently in Pain?  No/denies         [facility] PT Assessment - 03/31/17 0001      Berg Balance Test   Sit to Stand  Able to stand without using hands and stabilize independently    Standing Unsupported  Able to stand safely 2 minutes    Sitting with Back Unsupported but Feet Supported on Floor or Stool  Able to sit safely and securely 2 minutes    Stand to Sit  Sits safely with minimal use of hands    Transfers  Able to transfer safely, minor use of hands    Standing Unsupported with Eyes Closed  Able to stand 10 seconds with supervision    Standing Ubsupported with Feet Together  Able to place feet together independently and stand for 1 minute with supervision    From Standing, Reach Forward with Outstretched Arm  Can reach forward >12 cm safely (5")    From Standing Position, Pick up Object from Floor  Unable to  pick up shoe, but reaches 2-5 cm (1-2") from shoe and balances independently    From Standing Position, Turn to Look Behind Over each Shoulder  Turn sideways only but maintains balance    Turn 360 Degrees  Able to turn 360 degrees safely but slowly    Standing Unsupported, Alternately Place Feet on Step/Stool  Able to complete 4 steps without aid or supervision    Standing Unsupported, One Foot in Front  Able to take small step independently and hold 30 seconds    Standing on One Leg  Tries to lift leg/unable to hold 3 seconds but remains standing independently    Total Score  40                  [facility] Adult PT Treatment/Exercise - 03/31/17 0001      Knee/Hip Exercises: Aerobic   Nustep  L4 x10 min      Knee/Hip Exercises: Seated   Long Arc Quad  Strengthening;Both;20 reps;Weights;2  sets    [employer] Weight  4 lbs.      Knee/Hip Exercises: Supine   Bridges  Strengthening;Both;10 reps    Straight Leg Raises  Strengthening;Both;2 sets;10 reps    Other Supine Knee/Hip Exercises  clam with red t-band x30          Balance Exercises - 03/31/17 1045      Balance Exercises: Standing   Standing Eyes Opened  Wide (BOA);Foam/compliant surface;Time;Solid surface;Narrow base of support (BOS) 54min    Standing Eyes Closed  Wide (BOA);Solid surface;2 reps;10 secs    Tandem Stance  4 reps;Eyes open;Upper extremity support 1;Limitations    SLS  1 rep;Limitations    Standing, One Foot on a Step  8 inch;2 reps;Eyes open    Step Ups  6 inch;Forward;UE support 1 2x10    Turning  Both;Limitations    Heel Raises Limitations  x10    Toe Raise Limitations  x10    Sit to Stand Time  x10             PT Long Term Goals - 03/09/17 1410      PT LONG TERM GOAL #1   Title  pt will be independent in her HEP and progression.     Time  8    Period  Weeks    Status  New    Target Date  05/04/17      PT LONG TERM GOAL #2   Title  Pt will be able to improve her BERG balance score from 40/56 to >/= 48/56.     Time  8    Period  Weeks    Status  New    Target Date  05/04/17      PT LONG TERM GOAL #3   Title  Pt will improve her bilatera LE strength to grossly 4/5 in order to improve functional mobility.     Time  8    Period  Weeks    Status  New    Target Date  05/04/17      PT LONG TERM GOAL #4   Title  Pt will report pain less than/ equal to 7/10 with ADL's.     Time  8    Period  Weeks    Status  New    Target Date  05/04/17            Plan - 03/31/17 1107    Clinical Impression Statement  Patient tolerated treatment fair today  with some ongoing fatigue throughout treatment. Patient required educational cueing to complate exercises and with correct technique. Patient able to progress exercises for strengthening and balance today. Patient BERG score showed  no improvement and at 40/56 today. Patient does not use her cane as instucted by PT yet was educated on use and encouraged to bring next treatment. Patient current goals ongoing at this time with balance and strength deficts.     Rehab Potential  Good    PT Frequency  2x / week    PT Duration  8 weeks    PT Treatment/Interventions  ADLs/Self Care Home Management;Electrical Stimulation;Ultrasound;Gait training;Stair training;Functional mobility training;Therapeutic activities;Therapeutic exercise;Balance training;Neuromuscular re-education;Patient/family education;Manual techniques;Moist Heat;Passive range of motion    PT Next Visit Plan  Nustep, balance exercises, LE strengthening, E-stim/heat to bilateral feet as appropriate    Consulted and Agree with Plan of Care  Patient       Patient will benefit from skilled therapeutic intervention in order to improve the following deficits and impairments:  Abnormal gait, Postural dysfunction, Pain, Decreased activity tolerance, Decreased balance, Difficulty walking, Decreased strength, Obesity  Visit Diagnosis: Other abnormalities of gait and mobility  Difficulty in walking, not elsewhere classified  Muscle weakness (generalized)     Problem List Patient Active Problem List   Diagnosis Date Noted  . Gait abnormality 02/09/2017  . Obstructive sleep apnea 02/09/2017  . Diabetic peripheral neuropathy ([place]) 09/02/2016  . Peripheral neuropathy 07/29/2016  . Spinal stenosis of lumbar region 07/29/2016  . Excessive daytime sleepiness 07/29/2016  . Morbid obesity ([place]) 07/29/2016  . 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 Community acquired pneumonia 05/31/2015  . CAP (community acquired pneumonia) 05/31/2015  . Anemia 05/31/2015  . Diastolic CHF, chronic ([place]) 05/31/2015  . Paroxysmal atrial fibrillation (HCC)   . Obesity 10/15/2013  . Diastolic dysfunction with chronic heart failure ([place]) 06/23/2013  . Atrial fibrillation ([place]) 06/20/2010  .  Pneumonia   . Abnormal cardiovascular function study   . High cholesterol   . Scoliosis   . Spinal stenosis   . Anxiety   . Osteoarthritis   . Osteoporosis   . Mild aortic sclerosis ([place])   . Degenerative disc disease   . Vitamin D deficiency   . HEMOPTYSIS UNSPECIFIED 05/12/2010  . Diabetes mellitus, type 2 ([place]) 11/19/2009  . Hypertensive cardiovascular disease 11/19/2009  . DYSPNEA ON EXERTION 11/19/2009    Ulysess Witz P, PTA 03/31/2017, 11:15 AM  [facility] [place], [place], [address] Phone: [phone]   Fax:  [phone]  Name: Kristine [name] MRN: [id number] Date of Birth: 1945/06/16

## 2017-04-01 DIAGNOSIS — R69 Illness, unspecified: Secondary | ICD-10-CM | POA: Diagnosis not present

## 2017-04-02 ENCOUNTER — Ambulatory Visit: Payer: Medicare HMO | Admitting: Physical Therapy

## 2017-04-02 DIAGNOSIS — R2689 Other abnormalities of gait and mobility: Secondary | ICD-10-CM | POA: Diagnosis not present

## 2017-04-02 DIAGNOSIS — R262 Difficulty in walking, not elsewhere classified: Secondary | ICD-10-CM | POA: Diagnosis not present

## 2017-04-02 DIAGNOSIS — M6281 Muscle weakness (generalized): Secondary | ICD-10-CM | POA: Diagnosis not present

## 2017-04-02 NOTE — Therapy (Signed)
[facility] [place], [place], [address] Phone: [phone]   Fax:  [phone]  Physical Therapy Treatment  Patient Details  Name: Kristine [name] MRN: [id number] Date of Birth: February 26, 1946 Referring Provider: Marcial Pacas,    Encounter Date: 04/02/2017  PT End of Session - 04/02/17 1039    Visit Number  6    Number of Visits  16    Date for PT Re-Evaluation  05/10/17    Authorization Type  medicare, G-codes and KX modifier after visit 15    PT Start Time  1030    PT Stop Time  1118    PT Time Calculation (min)  48 min    Activity Tolerance  Patient tolerated treatment well    Behavior During Therapy  [facility] for tasks assessed/performed       Past Medical History:  Diagnosis Date  . Anxiety   . Asthma   . Atrial fibrillation ([place])   . CHF (congestive heart failure) ([place])   . Chronic bronchitis ([place])   . GERD (gastroesophageal reflux disease)   . High cholesterol   . History of hiatal hernia   . HTN (hypertension)   . Mild aortic sclerosis ([place])   . Morbid obesity ([place])   . Osteoporosis   . Persistent atrial fibrillation ([place])   . Scoliosis   . Spinal stenosis   . Type II diabetes mellitus ([place])   . Vitamin D deficiency     Past Surgical History:  Procedure Laterality Date  . BLADDER SUSPENSION  1980s  . CARDIAC CATHETERIZATION  11/16/09   SMOOTH AND NORMAL  . CARDIOVERSION N/A 01/07/2015   Procedure: CARDIOVERSION;  Surgeon: Skeet Latch, MD;  Location: [facility] ENDOSCOPY;  Service: Cardiovascular;  Laterality: N/A;  . CARDIOVERSION N/A 03/11/2015   Procedure: CARDIOVERSION;  Surgeon: Skeet Latch, MD;  Location: [place];  Service: Cardiovascular;  Laterality: N/A;  . CARPAL TUNNEL RELEASE Left 1970s  . CATARACT EXTRACTION W/ INTRAOCULAR LENS  IMPLANT, BILATERAL Bilateral ~ 2010  . COLONOSCOPY  08/14/08  . FINGER FRACTURE SURGERY Right 1970s   "ring finger"  . FRACTURE SURGERY    . LAPAROSCOPIC  CHOLECYSTECTOMY  1980s  . TUBAL LIGATION    . VAGINAL HYSTERECTOMY  1980    There were no vitals filed for this visit.  Subjective Assessment - 04/02/17 1039    Subjective  Patient states she feel she is doing better by coming.    Currently in Pain?  No/denies                      [facility] Adult PT Treatment/Exercise - 04/02/17 0001      Knee/Hip Exercises: Aerobic   Nustep  L4 x13 min      Knee/Hip Exercises: Supine   Bridges  Strengthening;1 set;10 reps 5 sec hold          Balance Exercises - 04/02/17 1051      Balance Exercises: Standing   Standing Eyes Opened  Wide (BOA);Head turns;Foam/compliant surface;Other reps (comment);Narrow base of support (BOS) also head/up/down;     SLS  Solid surface;Upper extremity support 1;2 reps;Limitations Bil    Balance Beam  standing with toes on beam for COG challenge    Partial Tandem Stance  Eyes open;Other reps (comment) with reaching, reaching upward as to cabinet x 10 each    Sit to Stand Time  x10        PT Education - 04/02/17 1129  Education provided  Yes    Education Details  HeP; balance to be done on both legs feet together in a corner with her back to it.     Person(s) Educated  Patient    Methods  Explanation;Demonstration;Verbal cues;Handout    Comprehension  Verbalized understanding;Returned demonstration          PT Long Term Goals - 03/09/17 1410      PT LONG TERM GOAL #1   Title  pt will be independent in her HEP and progression.     Time  8    Period  Weeks    Status  New    Target Date  05/04/17      PT LONG TERM GOAL #2   Title  Pt will be able to improve her BERG balance score from 40/56 to >/= 48/56.     Time  8    Period  Weeks    Status  New    Target Date  05/04/17      PT LONG TERM GOAL #3   Title  Pt will improve her bilatera LE strength to grossly 4/5 in order to improve functional mobility.     Time  8    Period  Weeks    Status  New    Target Date  05/04/17       PT LONG TERM GOAL #4   Title  Pt will report pain less than/ equal to 7/10 with ADL's.     Time  8    Period  Weeks    Status  New    Target Date  05/04/17            Plan - 04/02/17 1130    Clinical Impression Statement  Patient did well with balance activities today. She has poor core strength with bridge and SLS attempts.     PT Treatment/Interventions  ADLs/Self Care Home Management;Electrical Stimulation;Ultrasound;Gait training;Stair training;Functional mobility training;Therapeutic activities;Therapeutic exercise;Balance training;Neuromuscular re-education;Patient/family education;Manual techniques;Moist Heat;Passive range of motion    PT Next Visit Plan  Nustep, standing UBE for core, balance exercises, LE strengthening, E-stim/heat to bilateral feet as appropriate    PT Home Exercise Plan  bridge, corner feet together head/up/down balance    Consulted and Agree with Plan of Care  Patient       Patient will benefit from skilled therapeutic intervention in order to improve the following deficits and impairments:  Abnormal gait, Postural dysfunction, Pain, Decreased activity tolerance, Decreased balance, Difficulty walking, Decreased strength, Obesity  Visit Diagnosis: Other abnormalities of gait and mobility  Muscle weakness (generalized)  Difficulty in walking, not elsewhere classified     Problem List Patient Active Problem List   Diagnosis Date Noted  . Gait abnormality 02/09/2017  . Obstructive sleep apnea 02/09/2017  . Diabetic peripheral neuropathy ([place]) 09/02/2016  . Peripheral neuropathy 07/29/2016  . Spinal stenosis of lumbar region 07/29/2016  . Excessive daytime sleepiness 07/29/2016  . Morbid obesity ([place]) 07/29/2016  . 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 Community acquired pneumonia 05/31/2015  . CAP (community acquired pneumonia) 05/31/2015  . Anemia 05/31/2015  . Diastolic CHF, chronic ([place]) 05/31/2015  . Paroxysmal atrial fibrillation (HCC)   .  Obesity 10/15/2013  . Diastolic dysfunction with chronic heart failure ([place]) 06/23/2013  . Atrial fibrillation ([place]) 06/20/2010  . Pneumonia   . Abnormal cardiovascular function study   . High cholesterol   . Scoliosis   . Spinal stenosis   . Anxiety   . Osteoarthritis   .  Osteoporosis   . Mild aortic sclerosis ([place])   . Degenerative disc disease   . Vitamin D deficiency   . HEMOPTYSIS UNSPECIFIED 05/12/2010  . Diabetes mellitus, type 2 ([place]) 11/19/2009  . Hypertensive cardiovascular disease 11/19/2009  . DYSPNEA ON EXERTION 11/19/2009    Madelyn Flavors PT 04/02/2017, 11:38 AM  [facility] [address] [place], [place], [address] Phone: [phone]   Fax:  [phone]  Name: SILVANA HOLECEK MRN: [id number] Date of Birth: Oct 08, 1945

## 2017-04-02 NOTE — Patient Instructions (Signed)
Balance: Neck Flexion / Extension, Eyes Open - Unilateral (Varied Surfaces)    Stand on left foot, eyes open. Balance and move head up and down. Repeat ____ times per set. Do ____ sets per session. Do ____ sessions per week. Repeat on compliant surface: foam.  Copyright  VHI. All rights reserved.     Bridge [employer] small of back into mat, lift bottom up and hold 5 seconds counting out loud. Hold for _5 seconds. Repeat _10-20___ times. 1-2 times per day.  Copyright  VHI. All rights reserved.    Madelyn Flavors, PT 04/02/17 11:25 AM [facility] [place], [place], [address] Phone: [phone]   Fax:  [phone]

## 2017-04-06 DIAGNOSIS — E1122 Type 2 diabetes mellitus with diabetic chronic kidney disease: Secondary | ICD-10-CM | POA: Diagnosis not present

## 2017-04-06 DIAGNOSIS — I129 Hypertensive chronic kidney disease with stage 1 through stage 4 chronic kidney disease, or unspecified chronic kidney disease: Secondary | ICD-10-CM | POA: Diagnosis not present

## 2017-04-06 DIAGNOSIS — N184 Chronic kidney disease, stage 4 (severe): Secondary | ICD-10-CM | POA: Diagnosis not present

## 2017-04-06 DIAGNOSIS — Z6841 Body Mass Index (BMI) 40.0 and over, adult: Secondary | ICD-10-CM | POA: Diagnosis not present

## 2017-04-06 DIAGNOSIS — E8770 Fluid overload, unspecified: Secondary | ICD-10-CM | POA: Diagnosis not present

## 2017-04-07 ENCOUNTER — Ambulatory Visit: Payer: Medicare HMO | Admitting: Physical Therapy

## 2017-04-07 DIAGNOSIS — R262 Difficulty in walking, not elsewhere classified: Secondary | ICD-10-CM

## 2017-04-07 DIAGNOSIS — M6281 Muscle weakness (generalized): Secondary | ICD-10-CM

## 2017-04-07 DIAGNOSIS — R2689 Other abnormalities of gait and mobility: Secondary | ICD-10-CM | POA: Diagnosis not present

## 2017-04-07 NOTE — Therapy (Signed)
[facility] [place], [place], [address] Phone: [phone]   Fax:  [phone]  Physical Therapy Treatment  Patient Details  Name: Kristine [name] MRN: [id number] Date of Birth: 04-03-1945 Referring Provider: Marcial Pacas,    Encounter Date: 04/07/2017  PT End of Session - 04/07/17 1032    Visit Number  7    Number of Visits  16    Date for PT Re-Evaluation  05/10/17    Authorization Type  medicare, G-codes and KX modifier after visit 15    PT Start Time  1030    PT Stop Time  1105 2 units secondary to patient requesting early end to session    PT Time Calculation (min)  35 min    Activity Tolerance  Patient tolerated treatment well    Behavior During Therapy  [facility] for tasks assessed/performed       Past Medical History:  Diagnosis Date  . Anxiety   . Asthma   . Atrial fibrillation ([place])   . CHF (congestive heart failure) ([place])   . Chronic bronchitis ([place])   . GERD (gastroesophageal reflux disease)   . High cholesterol   . History of hiatal hernia   . HTN (hypertension)   . Mild aortic sclerosis ([place])   . Morbid obesity ([place])   . Osteoporosis   . Persistent atrial fibrillation ([place])   . Scoliosis   . Spinal stenosis   . Type II diabetes mellitus ([place])   . Vitamin D deficiency     Past Surgical History:  Procedure Laterality Date  . BLADDER SUSPENSION  1980s  . CARDIAC CATHETERIZATION  11/16/09   SMOOTH AND NORMAL  . CARDIOVERSION N/A 01/07/2015   Procedure: CARDIOVERSION;  Surgeon: Skeet Latch, MD;  Location: [facility] ENDOSCOPY;  Service: Cardiovascular;  Laterality: N/A;  . CARDIOVERSION N/A 03/11/2015   Procedure: CARDIOVERSION;  Surgeon: Skeet Latch, MD;  Location: [place];  Service: Cardiovascular;  Laterality: N/A;  . CARPAL TUNNEL RELEASE Left 1970s  . CATARACT EXTRACTION W/ INTRAOCULAR LENS  IMPLANT, BILATERAL Bilateral ~ 2010  . COLONOSCOPY  08/14/08  . FINGER FRACTURE SURGERY Right  1970s   "ring finger"  . FRACTURE SURGERY    . LAPAROSCOPIC CHOLECYSTECTOMY  1980s  . TUBAL LIGATION    . VAGINAL HYSTERECTOMY  1980    There were no vitals filed for this visit.  Subjective Assessment - 04/07/17 1031    Subjective  No new complaints following arrival. Reports that she cannot feel her toes.    Currently in Pain?  Other (Comment) No pain assessment reported by patient upon arrival         [facility] PT Assessment - 04/07/17 0001      Assessment   Medical Diagnosis  gait abnormality, bilateral peripheral neuropathy    Hand Dominance  Right    Prior Therapy  yes, years ago for shoulder      Precautions   Precautions  Fall      Restrictions   Weight Bearing Restrictions  No                  [facility] Adult PT Treatment/Exercise - 04/07/17 0001      Knee/Hip Exercises: Aerobic   Nustep  L5 x8 min      Knee/Hip Exercises: Seated   Long Arc Quad  Strengthening;Both;20 reps;Weights;2 sets    [employer] Weight  4 lbs.    Clamshell with [employer]raBand  Red x20 reps    Sit  to [facility]  20 reps;without UE support      Knee/Hip Exercises: Supine   Bridges  Strengthening;20 reps          Balance Exercises - 04/07/17 1059      Balance Exercises: Standing   Standing Eyes Opened  Narrow base of support (BOS);Foam/compliant surface Head turns, up/down x20 reps each    Standing Eyes Closed  Narrow base of support (BOS);Foam/compliant surface x1 min    Tandem Stance  Eyes open;Intermittent upper extremity support;1 rep x1 min but limited secondary to "drunk" feeling    SLS  Eyes open;Solid surface;Intermittent upper extremity support multiple quick reps for each LE    Standing, One Foot on a Step  Eyes open;6 inch x1 min each    Step Ups  Forward;6 inch;Intermittent UE support x20 reps for each LE             PT Long Term Goals - 03/09/17 1410      PT LONG TERM GOAL #1   Title  pt will be independent in her HEP and progression.     Time  8    Period   Weeks    Status  New    Target Date  05/04/17      PT LONG TERM GOAL #2   Title  Pt will be able to improve her BERG balance score from 40/56 to >/= 48/56.     Time  8    Period  Weeks    Status  New    Target Date  05/04/17      PT LONG TERM GOAL #3   Title  Pt will improve her bilatera LE strength to grossly 4/5 in order to improve functional mobility.     Time  8    Period  Weeks    Status  New    Target Date  05/04/17      PT LONG TERM GOAL #4   Title  Pt will report pain less than/ equal to 7/10 with ADL's.     Time  8    Period  Weeks    Status  New    Target Date  05/04/17            Plan - 04/07/17 1113    Clinical Impression Statement  Patient tolerated today's treatment fairly well although she continues to report numbness in her feet and limited progress per patient report. Patient more limited today with SLS activities and uneven surface activities such as head turns in DLS. Patient reported "drunk" sensation and fatigue with exercises and requested to end treatment.    Rehab Potential  Good    PT Frequency  2x / week    PT Duration  8 weeks    PT Treatment/Interventions  ADLs/Self Care Home Management;Electrical Stimulation;Ultrasound;Gait training;Stair training;Functional mobility training;Therapeutic activities;Therapeutic exercise;Balance training;Neuromuscular re-education;Patient/family education;Manual techniques;Moist Heat;Passive range of motion    PT Next Visit Plan  Nustep, standing UBE for core, balance exercises, LE strengthening, E-stim/heat to bilateral feet as appropriate    PT Home Exercise Plan  bridge, corner feet together head/up/down balance    Consulted and Agree with Plan of Care  Patient       Patient will benefit from skilled therapeutic intervention in order to improve the following deficits and impairments:  Abnormal gait, Postural dysfunction, Pain, Decreased activity tolerance, Decreased balance, Difficulty walking, Decreased  strength, Obesity  Visit Diagnosis: Other abnormalities of gait and mobility  Muscle weakness (generalized)  Difficulty  in walking, not elsewhere classified     Problem List Patient Active Problem List   Diagnosis Date Noted  . Gait abnormality 02/09/2017  . Obstructive sleep apnea 02/09/2017  . Diabetic peripheral neuropathy ([place]) 09/02/2016  . Peripheral neuropathy 07/29/2016  . Spinal stenosis of lumbar region 07/29/2016  . Excessive daytime sleepiness 07/29/2016  . Morbid obesity ([place]) 07/29/2016  . 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 Community acquired pneumonia 05/31/2015  . CAP (community acquired pneumonia) 05/31/2015  . Anemia 05/31/2015  . Diastolic CHF, chronic ([place]) 05/31/2015  . Paroxysmal atrial fibrillation (HCC)   . Obesity 10/15/2013  . Diastolic dysfunction with chronic heart failure ([place]) 06/23/2013  . Atrial fibrillation ([place]) 06/20/2010  . Pneumonia   . Abnormal cardiovascular function study   . High cholesterol   . Scoliosis   . Spinal stenosis   . Anxiety   . Osteoarthritis   . Osteoporosis   . Mild aortic sclerosis ([place])   . Degenerative disc disease   . Vitamin D deficiency   . HEMOPTYSIS UNSPECIFIED 05/12/2010  . Diabetes mellitus, type 2 ([place]) 11/19/2009  . Hypertensive cardiovascular disease 11/19/2009  . DYSPNEA ON EXERTION 11/19/2009    Standley Brooking, PTA 04/07/2017, 11:25 AM  [facility] [address] [place], [place], [address] Phone: [phone]   Fax:  [phone]  Name: Kristine [name] MRN: [id number] Date of Birth: 12-Oct-1945

## 2017-04-09 ENCOUNTER — Ambulatory Visit: Payer: Medicare HMO | Admitting: Physical Therapy

## 2017-04-09 ENCOUNTER — Encounter: Payer: Self-pay | Admitting: Physical Therapy

## 2017-04-09 DIAGNOSIS — R2689 Other abnormalities of gait and mobility: Secondary | ICD-10-CM

## 2017-04-09 DIAGNOSIS — R262 Difficulty in walking, not elsewhere classified: Secondary | ICD-10-CM | POA: Diagnosis not present

## 2017-04-09 DIAGNOSIS — M6281 Muscle weakness (generalized): Secondary | ICD-10-CM | POA: Diagnosis not present

## 2017-04-09 NOTE — Therapy (Signed)
[facility] [place], [place], [address] Phone: [phone]   Fax:  [phone]  Physical Therapy Treatment  Patient Details  Name: Kristine [name] MRN: [id number] Date of Birth: 17-Dec-1945 Referring Provider: Marcial Pacas,    Encounter Date: 04/09/2017  PT End of Session - 04/09/17 0955    Visit Number  8    Number of Visits  16    Date for PT Re-Evaluation  05/10/17    Authorization Type   KX modifier after visit 15    PT Start Time  0945    PT Stop Time  1018    PT Time Calculation (min)  33 min    Activity Tolerance  Patient tolerated treatment well    Behavior During Therapy  [facility] for tasks assessed/performed       Past Medical History:  Diagnosis Date  . Anxiety   . Asthma   . Atrial fibrillation ([place])   . CHF (congestive heart failure) ([place])   . Chronic bronchitis ([place])   . GERD (gastroesophageal reflux disease)   . High cholesterol   . History of hiatal hernia   . HTN (hypertension)   . Mild aortic sclerosis ([place])   . Morbid obesity ([place])   . Osteoporosis   . Persistent atrial fibrillation ([place])   . Scoliosis   . Spinal stenosis   . Type II diabetes mellitus ([place])   . Vitamin D deficiency     Past Surgical History:  Procedure Laterality Date  . BLADDER SUSPENSION  1980s  . CARDIAC CATHETERIZATION  11/16/09   SMOOTH AND NORMAL  . CARDIOVERSION N/A 01/07/2015   Procedure: CARDIOVERSION;  Surgeon: Skeet Latch, MD;  [facility] ENDOSCOPY;  Service: Cardiovascular;  Laterality: N/A;  . CARDIOVERSION N/A 03/11/2015   Procedure: CARDIOVERSION;  Surgeon: Skeet Latch, MD;  Location: [facility];  Service: Cardiovascular;  Laterality: N/A;  . CARPAL TUNNEL RELEASE Left 1970s  . CATARACT EXTRACTION W/ INTRAOCULAR LENS  IMPLANT, BILATERAL Bilateral ~ 2010  . COLONOSCOPY  08/14/08  . FINGER FRACTURE SURGERY Right 1970s   "ring finger"  . FRACTURE SURGERY    . LAPAROSCOPIC CHOLECYSTECTOMY  1980s   . TUBAL LIGATION    . VAGINAL HYSTERECTOMY  1980    There were no vitals filed for this visit.  Subjective Assessment - 04/09/17 0954    Subjective  Pt arriving to therpay with no new complaints.     Currently in Pain?  No/denies         [facility] PT Assessment - 04/09/17 0001      Assessment   Medical Diagnosis  gait abnormality, bilateral peripheral neuropathy    Hand Dominance  Right    Prior Therapy  yes, years ago for shoulder      Precautions   Precautions  Fall      Restrictions   Weight Bearing Restrictions  No      Ambulation/Gait   Gait Comments  Pt's new straight cane was adjusted to pt's height. Pt was trainined to amb with 3 point gait pattern using a straight cane. Pt had initial trouble with sequencing but after several attempts pt able to get the pattern with intermittent verbal cues.                   [facility] Adult PT Treatment/Exercise - 04/09/17 0001      Knee/Hip Exercises: Aerobic   Nustep  L6 x8 min      Knee/Hip Exercises: Standing  Heel Raises  Both;5 reps    Hip Abduction  Both;5 sets    Hip Extension  Both;5 reps    Forward Step Up  Both;10 reps pt required sitting rest break today due to fatigue      Knee/Hip Exercises: Seated   Sit to Sand  20 reps;without UE support          Balance Exercises - 04/09/17 1115      Balance Exercises: Standing   Standing Eyes Opened  Narrow base of support (BOS);Foam/compliant surface Head turns, up/down x20 reps each    Standing Eyes Closed  Narrow base of support (BOS);Foam/compliant surface x1 min    Tandem Stance  Eyes open;Intermittent upper extremity support;1 rep x1 min but limited secondary to "drunk" feeling    Standing, One Foot on a Step  Eyes open;6 inch x1 min each    Step Ups  Forward;6 inch;Intermittent UE support x20 reps for each LE        PT Education - 04/09/17 0954    Education provided  Yes    Education Details  Standing at kitchen sink for 4 way hip exercises     Person(s) Educated  Patient    Methods  Explanation;Demonstration;Handout    Comprehension  Verbalized understanding;Returned demonstration          PT Long Term Goals - 04/09/17 0957      PT LONG TERM GOAL #1   Title  pt will be independent in her HEP and progression.     Status  On-going      PT LONG TERM GOAL #2   Title  Pt will be able to improve her BERG balance score from 40/56 to >/= 48/56.     Time  8    Period  Weeks    Status  On-going      PT LONG TERM GOAL #3   Title  Pt will improve her bilatera LE strength to grossly 4/5 in order to improve functional mobility.     Period  Weeks    Status  On-going      PT LONG TERM GOAL #4   Title  Pt will report pain less than/ equal to 7/10 with ADL's.     Status  On-going            Plan - 04/09/17 1029    Clinical Impression Statement  Pt arriving to therapy reporting no pain. Pt tolerated only 30 minutes of exercises today with 2 sitting rest breaks required. Pt reported after 30 minutes of ther ex and ther activities she was too tired to continue. HEP was printed out for pt with standing balance and hip exercises. Gaiit training performed and pt instructed in how to amb using her newly purchased straight cane. Continue skilled PT as pt tolerates.     PT Frequency  2x / week    PT Duration  8 weeks    PT Treatment/Interventions  ADLs/Self Care Home Management;Electrical Stimulation;Ultrasound;Gait training;Stair training;Functional mobility training;Therapeutic activities;Therapeutic exercise;Balance training;Neuromuscular re-education;Patient/family education;Manual techniques;Moist Heat;Passive range of motion    PT Next Visit Plan  Nustep, standing UBE for core, balance exercises, LE strengthening, E-stim/heat to bilateral feet as appropriate    PT Home Exercise Plan  bridge, corner feet together head/up/down balance    Consulted and Agree with Plan of Care  Patient       Patient will benefit from skilled  therapeutic intervention in order to improve the following deficits and impairments:  Abnormal gait,  Postural dysfunction, Pain, Decreased activity tolerance, Decreased balance, Difficulty walking, Decreased strength, Obesity  Visit Diagnosis: Other abnormalities of gait and mobility  Muscle weakness (generalized)  Difficulty in walking, not elsewhere classified     Problem List Patient Active Problem List   Diagnosis Date Noted  . Gait abnormality 02/09/2017  . Obstructive sleep apnea 02/09/2017  . Diabetic peripheral neuropathy ([place]) 09/02/2016  . Peripheral neuropathy 07/29/2016  . Spinal stenosis of lumbar region 07/29/2016  . Excessive daytime sleepiness 07/29/2016  . Morbid obesity ([place]) 07/29/2016  . 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 Community acquired pneumonia 05/31/2015  . CAP (community acquired pneumonia) 05/31/2015  . Anemia 05/31/2015  . Diastolic CHF, chronic ([place]) 05/31/2015  . Paroxysmal atrial fibrillation ([facility])   . Obesity 10/15/2013  . Diastolic dysfunction with chronic heart failure ([place]) 06/23/2013  . Atrial fibrillation ([place]) 06/20/2010  . Pneumonia   . Abnormal cardiovascular function study   . High cholesterol   . Scoliosis   . Spinal stenosis   . Anxiety   . Osteoarthritis   . Osteoporosis   . Mild aortic sclerosis ([place])   . Degenerative disc disease   . Vitamin D deficiency   . HEMOPTYSIS UNSPECIFIED 05/12/2010  . Diabetes mellitus, type 2 ([place]) 11/19/2009  . Hypertensive cardiovascular disease 11/19/2009  . DYSPNEA ON EXERTION 11/19/2009    Oretha Caprice, PT 04/09/2017, 11:20 AM  [facility] [address] [place], [place], [address] Phone: [phone]   Fax:  [phone]  Name: RADHIKA DERSHEM MRN: [id number] Date of Birth: [birth year]

## 2017-04-14 ENCOUNTER — Other Ambulatory Visit: Payer: Self-pay

## 2017-04-14 ENCOUNTER — Encounter: Payer: Medicare HMO | Admitting: Physical Therapy

## 2017-04-14 DIAGNOSIS — Z6841 Body Mass Index (BMI) 40.0 and over, adult: Secondary | ICD-10-CM | POA: Diagnosis not present

## 2017-04-14 DIAGNOSIS — I480 Paroxysmal atrial fibrillation: Secondary | ICD-10-CM | POA: Diagnosis not present

## 2017-04-14 DIAGNOSIS — R05 Cough: Secondary | ICD-10-CM | POA: Diagnosis not present

## 2017-04-14 DIAGNOSIS — N184 Chronic kidney disease, stage 4 (severe): Secondary | ICD-10-CM | POA: Diagnosis not present

## 2017-04-14 DIAGNOSIS — J45998 Other asthma: Secondary | ICD-10-CM | POA: Diagnosis not present

## 2017-04-14 DIAGNOSIS — E1159 Type 2 diabetes mellitus with other circulatory complications: Secondary | ICD-10-CM | POA: Diagnosis not present

## 2017-04-14 DIAGNOSIS — I10 Essential (primary) hypertension: Secondary | ICD-10-CM | POA: Diagnosis not present

## 2017-04-14 DIAGNOSIS — J3089 Other allergic rhinitis: Secondary | ICD-10-CM | POA: Diagnosis not present

## 2017-04-14 DIAGNOSIS — I509 Heart failure, unspecified: Secondary | ICD-10-CM | POA: Diagnosis not present

## 2017-04-14 MED ORDER — FUROSEMIDE 80 MG PO TABS: ORAL_TABLET | ORAL | 3 refills | Status: DC

## 2017-04-16 ENCOUNTER — Encounter: Payer: Medicare HMO | Admitting: Physical Therapy

## 2017-04-22 DIAGNOSIS — R82998 Other abnormal findings in urine: Secondary | ICD-10-CM | POA: Diagnosis not present

## 2017-04-22 DIAGNOSIS — E1140 Type 2 diabetes mellitus with diabetic neuropathy, unspecified: Secondary | ICD-10-CM | POA: Diagnosis not present

## 2017-04-22 DIAGNOSIS — M10071 Idiopathic gout, right ankle and foot: Secondary | ICD-10-CM | POA: Diagnosis not present

## 2017-04-22 DIAGNOSIS — Z794 Long term (current) use of insulin: Secondary | ICD-10-CM | POA: Diagnosis not present

## 2017-04-22 DIAGNOSIS — I129 Hypertensive chronic kidney disease with stage 1 through stage 4 chronic kidney disease, or unspecified chronic kidney disease: Secondary | ICD-10-CM | POA: Diagnosis not present

## 2017-04-22 DIAGNOSIS — Z0000 Encounter for general adult medical examination without abnormal findings: Secondary | ICD-10-CM | POA: Diagnosis not present

## 2017-04-22 DIAGNOSIS — R8290 Unspecified abnormal findings in urine: Secondary | ICD-10-CM | POA: Diagnosis not present

## 2017-04-22 DIAGNOSIS — E7849 Other hyperlipidemia: Secondary | ICD-10-CM | POA: Diagnosis not present

## 2017-04-22 DIAGNOSIS — E038 Other specified hypothyroidism: Secondary | ICD-10-CM | POA: Diagnosis not present

## 2017-04-22 DIAGNOSIS — I5189 Other ill-defined heart diseases: Secondary | ICD-10-CM | POA: Diagnosis not present

## 2017-04-22 DIAGNOSIS — R69 Illness, unspecified: Secondary | ICD-10-CM | POA: Diagnosis not present

## 2017-04-22 DIAGNOSIS — N184 Chronic kidney disease, stage 4 (severe): Secondary | ICD-10-CM | POA: Diagnosis not present

## 2017-04-22 DIAGNOSIS — N390 Urinary tract infection, site not specified: Secondary | ICD-10-CM | POA: Diagnosis not present

## 2017-04-22 DIAGNOSIS — E1165 Type 2 diabetes mellitus with hyperglycemia: Secondary | ICD-10-CM | POA: Diagnosis not present

## 2017-04-28 DIAGNOSIS — N952 Postmenopausal atrophic vaginitis: Secondary | ICD-10-CM | POA: Diagnosis not present

## 2017-04-30 ENCOUNTER — Other Ambulatory Visit: Payer: Self-pay | Admitting: *Deleted

## 2017-04-30 MED ORDER — LOSARTAN POTASSIUM 100 MG PO TABS: 100.0000 mg | ORAL_TABLET | Freq: Every day | ORAL | 2 refills | Status: DC

## 2017-05-04 DIAGNOSIS — L814 Other melanin hyperpigmentation: Secondary | ICD-10-CM | POA: Diagnosis not present

## 2017-05-04 DIAGNOSIS — Z6841 Body Mass Index (BMI) 40.0 and over, adult: Secondary | ICD-10-CM | POA: Diagnosis not present

## 2017-05-04 DIAGNOSIS — L821 Other seborrheic keratosis: Secondary | ICD-10-CM | POA: Diagnosis not present

## 2017-05-04 DIAGNOSIS — L570 Actinic keratosis: Secondary | ICD-10-CM | POA: Diagnosis not present

## 2017-05-04 DIAGNOSIS — D1801 Hemangioma of skin and subcutaneous tissue: Secondary | ICD-10-CM | POA: Diagnosis not present

## 2017-05-04 DIAGNOSIS — E1165 Type 2 diabetes mellitus with hyperglycemia: Secondary | ICD-10-CM | POA: Diagnosis not present

## 2017-05-04 DIAGNOSIS — L218 Other seborrheic dermatitis: Secondary | ICD-10-CM | POA: Diagnosis not present

## 2017-05-04 DIAGNOSIS — L438 Other lichen planus: Secondary | ICD-10-CM | POA: Diagnosis not present

## 2017-05-04 DIAGNOSIS — Z85828 Personal history of other malignant neoplasm of skin: Secondary | ICD-10-CM | POA: Diagnosis not present

## 2017-05-04 DIAGNOSIS — Z794 Long term (current) use of insulin: Secondary | ICD-10-CM | POA: Diagnosis not present

## 2017-05-04 DIAGNOSIS — N184 Chronic kidney disease, stage 4 (severe): Secondary | ICD-10-CM | POA: Diagnosis not present

## 2017-05-04 DIAGNOSIS — I10 Essential (primary) hypertension: Secondary | ICD-10-CM | POA: Diagnosis not present

## 2017-05-07 DIAGNOSIS — I129 Hypertensive chronic kidney disease with stage 1 through stage 4 chronic kidney disease, or unspecified chronic kidney disease: Secondary | ICD-10-CM | POA: Diagnosis not present

## 2017-05-07 DIAGNOSIS — N184 Chronic kidney disease, stage 4 (severe): Secondary | ICD-10-CM | POA: Diagnosis not present

## 2017-05-07 DIAGNOSIS — Z6841 Body Mass Index (BMI) 40.0 and over, adult: Secondary | ICD-10-CM | POA: Diagnosis not present

## 2017-05-07 DIAGNOSIS — E8770 Fluid overload, unspecified: Secondary | ICD-10-CM | POA: Diagnosis not present

## 2017-05-07 DIAGNOSIS — E1122 Type 2 diabetes mellitus with diabetic chronic kidney disease: Secondary | ICD-10-CM | POA: Diagnosis not present

## 2017-05-11 DIAGNOSIS — I10 Essential (primary) hypertension: Secondary | ICD-10-CM | POA: Diagnosis not present

## 2017-05-11 DIAGNOSIS — E1165 Type 2 diabetes mellitus with hyperglycemia: Secondary | ICD-10-CM | POA: Diagnosis not present

## 2017-05-11 DIAGNOSIS — Z6841 Body Mass Index (BMI) 40.0 and over, adult: Secondary | ICD-10-CM | POA: Diagnosis not present

## 2017-05-11 DIAGNOSIS — Z794 Long term (current) use of insulin: Secondary | ICD-10-CM | POA: Diagnosis not present

## 2017-06-17 DIAGNOSIS — Z794 Long term (current) use of insulin: Secondary | ICD-10-CM | POA: Diagnosis not present

## 2017-06-17 DIAGNOSIS — Z6841 Body Mass Index (BMI) 40.0 and over, adult: Secondary | ICD-10-CM | POA: Diagnosis not present

## 2017-06-17 DIAGNOSIS — I10 Essential (primary) hypertension: Secondary | ICD-10-CM | POA: Diagnosis not present

## 2017-06-17 DIAGNOSIS — E1165 Type 2 diabetes mellitus with hyperglycemia: Secondary | ICD-10-CM | POA: Diagnosis not present

## 2017-06-17 DIAGNOSIS — N184 Chronic kidney disease, stage 4 (severe): Secondary | ICD-10-CM | POA: Diagnosis not present

## 2017-06-28 DIAGNOSIS — Z6841 Body Mass Index (BMI) 40.0 and over, adult: Secondary | ICD-10-CM | POA: Diagnosis not present

## 2017-06-28 DIAGNOSIS — I129 Hypertensive chronic kidney disease with stage 1 through stage 4 chronic kidney disease, or unspecified chronic kidney disease: Secondary | ICD-10-CM | POA: Diagnosis not present

## 2017-06-28 DIAGNOSIS — E8770 Fluid overload, unspecified: Secondary | ICD-10-CM | POA: Diagnosis not present

## 2017-06-28 DIAGNOSIS — N184 Chronic kidney disease, stage 4 (severe): Secondary | ICD-10-CM | POA: Diagnosis not present

## 2017-06-28 DIAGNOSIS — E1122 Type 2 diabetes mellitus with diabetic chronic kidney disease: Secondary | ICD-10-CM | POA: Diagnosis not present

## 2017-07-02 DIAGNOSIS — I129 Hypertensive chronic kidney disease with stage 1 through stage 4 chronic kidney disease, or unspecified chronic kidney disease: Secondary | ICD-10-CM | POA: Diagnosis not present

## 2017-07-02 DIAGNOSIS — N184 Chronic kidney disease, stage 4 (severe): Secondary | ICD-10-CM | POA: Diagnosis not present

## 2017-07-21 DIAGNOSIS — E8770 Fluid overload, unspecified: Secondary | ICD-10-CM | POA: Diagnosis not present

## 2017-07-29 DIAGNOSIS — N184 Chronic kidney disease, stage 4 (severe): Secondary | ICD-10-CM | POA: Diagnosis not present

## 2017-07-29 DIAGNOSIS — E1122 Type 2 diabetes mellitus with diabetic chronic kidney disease: Secondary | ICD-10-CM | POA: Diagnosis not present

## 2017-07-29 DIAGNOSIS — Z6841 Body Mass Index (BMI) 40.0 and over, adult: Secondary | ICD-10-CM | POA: Diagnosis not present

## 2017-07-29 DIAGNOSIS — E8770 Fluid overload, unspecified: Secondary | ICD-10-CM | POA: Diagnosis not present

## 2017-07-29 DIAGNOSIS — I129 Hypertensive chronic kidney disease with stage 1 through stage 4 chronic kidney disease, or unspecified chronic kidney disease: Secondary | ICD-10-CM | POA: Diagnosis not present

## 2017-08-02 ENCOUNTER — Other Ambulatory Visit: Payer: Self-pay | Admitting: Neurology

## 2017-08-02 NOTE — Telephone Encounter (Signed)
Rx registry checked. Last refill was 07/01/2017. Her last OV was 02/09/17 and she does not have a follow up scheduled.

## 2017-08-03 ENCOUNTER — Other Ambulatory Visit: Payer: Self-pay | Admitting: Internal Medicine

## 2017-08-03 DIAGNOSIS — Z1231 Encounter for screening mammogram for malignant neoplasm of breast: Secondary | ICD-10-CM

## 2017-08-04 DIAGNOSIS — R69 Illness, unspecified: Secondary | ICD-10-CM | POA: Diagnosis not present

## 2017-08-06 ENCOUNTER — Ambulatory Visit (INDEPENDENT_AMBULATORY_CARE_PROVIDER_SITE_OTHER): Payer: Medicare HMO | Admitting: Cardiovascular Disease

## 2017-08-06 ENCOUNTER — Encounter: Payer: Self-pay | Admitting: Cardiovascular Disease

## 2017-08-06 ENCOUNTER — Encounter

## 2017-08-06 ENCOUNTER — Other Ambulatory Visit: Payer: Medicare HMO

## 2017-08-06 VITALS — BP 108/68 | HR 85 | Ht 69.0 in | Wt 280.0 lb

## 2017-08-06 DIAGNOSIS — I482 Chronic atrial fibrillation, unspecified: Secondary | ICD-10-CM

## 2017-08-06 DIAGNOSIS — I5032 Chronic diastolic (congestive) heart failure: Secondary | ICD-10-CM

## 2017-08-06 DIAGNOSIS — Z13220 Encounter for screening for lipoid disorders: Secondary | ICD-10-CM | POA: Diagnosis not present

## 2017-08-06 NOTE — Progress Notes (Signed)
Cardiology Office Note   Date:  08/06/2017   ID:  [name], DOB 12-Nov-1945, MRN [id number]  PCP:  Prince Solian, MD  Cardiologist:   Mertie Moores, MD   Chief Complaint  Patient presents with  . Atrial Fibrillation   1. Atrial fibrillation 2. Diabetes mellitus 3. Chronic diastolic CHF 4. Obesity.      Kristine [name] is a 72 year old female with a history of atrial fibrillation. She has done well since I last saw her. She's not had any episodes of chest pain or shortness breath. Her blood pressure readings have been normal.  She is tolerating the Xarelto fairly well. She has some occasional bleeding from the gums. She enjoys not having to check her INR levels.   She has occasional palpitations and heart racing. This typically can rest and these will resolve. She is able to wak about 10 minutes at a time.   Oct. 24, 2014:  June 23, 2013:  Kristine [name]'s BP is elevated. She had a recent echocardiogram that revealed a left ventricle systolic function that was at the lower limits of normal-50%. Study Conclusions  - Left ventricle: The cavity size was normal. Wall thickness was increased in a pattern of mild LVH. The estimated ejection fraction was 50%. Diffuse hypokinesis. Features are consistent with a pseudonormal left ventricular filling pattern, with concomitant abnormal relaxation and increased filling pressure (grade 2 diastolic dysfunction). - Aortic valve: There was no stenosis. - Mitral valve: Mildly calcified annulus. Mildly calcified leaflets . Trivial regurgitation. - Left atrium: The atrium was moderately dilated. - Right ventricle: The cavity size was normal. Systolic function was mildly reduced. - Right atrium: The atrium was moderately dilated. - Systemic veins: IVC measured 1.7 cm with < 50% respirophasic variation, suggesting RA pressure 8 mmHg. - Pericardium, extracardiac: A trivial pericardial effusion was identified posterior to the  heart  She was having some abdominal pain and   September 29, 2013:  Kaity is doing well. Stressed about coming here.   Jan. 7, 2015:  Darya is a 72 yo with hx of Afib and diastolic dysfunction She converted to NSR in the hospital with flecainide. She was tried on [place] but her QT interval prolonged. Was changed to Flecainide and has maintained NSR.  She is exercising regularly - 30 minutes a day. Is on Lasix 80 mg a day and mtalazone 2. 5 once a week. Is still retaining fluid. Trying to avoid salt in food.    June 11, 2014:   Kristine [name] is a 72 y.o. female who presents for follow-up of her atrial fibrillation. She has had some issues with fluid retention and  has been on metalazone.   Sept. 19, 2016:  Doing well.  Has had some shortness of breath - improved with increasing lasix to 80 BID .  BP is still high this am.  BP at home has been normal .  June 18, 2015:  She has tried [place] but had prolonged QT - so it was stopped Tried on flecainide Was tried on amiodarone  Had a cardioversion in Dec.   Amiodarone dose was lowered to 200 mg a day and she is now back in atrial fib She is not as symptomatic today  She is very fatigued. Has not been sleeping well at all Has lots of arm fatigue in her arms with any activity   Nov. 2, 2017:    Still short of breath No CP  Active,  Has a pins and needle sensation  August 27, 2016:  Kristine [name] is seen back today for follow up of her atrial fib  Has failed Flecainide, tikosyn and now  Is on amiodarone.   We had her on a reduced dose of amiodarone due to symptoms of peripheral neuropathy. The dose was increased up to 200 mg daily by Dr. Rayann Heman.  She is tentatively scheduled for cardioversion. She does not want to consider an ablation .   February 25, 2017:  Kristine [name] is seen back today for her chronic atrial fibrillation. She has discontinued the amiodarone.  She seems to be tolerating the atrial fibrillation quite well.   We have adopted a strategy of anticoagulation and rate control.  She developed acute on chronic diastolic CHF last Thursday.   She has been on Lyrica  Went to the ER.   She took an extra Lasix on the way there and urinated all of the extra weight  She still takes metolazone very rarely - perhaps   Aug 06, 2017:  Had some weakness while coming in this afternoon.    Had fasted all day - until 2 PM  Feeling better.  Breathing is good   She is had lots of leg edema.  She has been seeing Dr. [name].  She now takes Lasix 80 mg 3 times a day.  She takes metolazone 2.5 mg 3 days a week.    Past Medical History:  Diagnosis Date  . Anxiety   . Asthma   . Atrial fibrillation ([place])   . CHF (congestive heart failure) ([place])   . Chronic bronchitis ([place])   . GERD (gastroesophageal reflux disease)   . High cholesterol   . History of hiatal hernia   . HTN (hypertension)   . Mild aortic sclerosis ([place])   . Morbid obesity ([place])   . Osteoporosis   . Persistent atrial fibrillation ([place])   . Scoliosis   . Spinal stenosis   . Type II diabetes mellitus ([place])   . Vitamin D deficiency     Past Surgical History:  Procedure Laterality Date  . BLADDER SUSPENSION  1980s  . CARDIAC CATHETERIZATION  11/16/09   SMOOTH AND NORMAL  . CARDIOVERSION N/A 01/07/2015   Procedure: CARDIOVERSION;  Surgeon: Skeet Latch, MD;  Location: [facility] ENDOSCOPY;  Service: Cardiovascular;  Laterality: N/A;  . CARDIOVERSION N/A 03/11/2015   Procedure: CARDIOVERSION;  Surgeon: Skeet Latch, MD;  Location: [facility];  Service: Cardiovascular;  Laterality: N/A;  . CARPAL TUNNEL RELEASE Left 1970s  . CATARACT EXTRACTION W/ INTRAOCULAR LENS  IMPLANT, BILATERAL Bilateral ~ 2010  . COLONOSCOPY  08/14/08  . FINGER FRACTURE SURGERY Right 1970s   "ring finger"  . FRACTURE SURGERY    . LAPAROSCOPIC CHOLECYSTECTOMY  1980s  . TUBAL LIGATION    . VAGINAL HYSTERECTOMY  1980     Current Outpatient Medications  Medication  Sig Dispense Refill  . ALPRAZolam (XANAX) 0.25 MG tablet Take 0.125-0.25 mg by mouth 2 (two) times daily as needed for anxiety.   0  . BD PEN NEEDLE NANO U/F 32G X 4 MM MISC Inject 1 each as directed See admin instructions. Use pen needles with insulin pens daily  11  . clindamycin (CLEOCIN) 300 MG capsule TAKE 2 CAPSULES BY MOUTH 1 HOUR PRIOR TO DENTAL APPOINTMENT  0  . clonazePAM (KLONOPIN) 0.5 MG tablet TAKE 2 TABLETS BY MOUTH AT BEDTIME. 1-2 TABLETS AS NEEDED FOR SLEEP 60 tablet 1  . colchicine 0.6 MG tablet Take 0.6 mg by mouth daily.    Marland Kitchen  esomeprazole (NEXIUM) 40 MG capsule Take 40 mg by mouth daily as needed (for acid reflux).     Marland Kitchen estradiol (ESTRACE) 0.1 MG/GM vaginal cream Apply daily    . furosemide (LASIX) 80 MG tablet TAKE 1 TABLET (80 MG TOTAL) BY MOUTH 2 (TWO) TIMES DAILY. 180 tablet 3  . HUMALOG KWIKPEN 200 UNIT/ML SOPN Inject 26 Units into the skin daily.   6  . insulin detemir (LEVEMIR) 100 UNIT/ML injection Inject 70 Units into the skin 2 (two) times daily.     Marland Kitchen KLOR-CON 10 10 MEQ tablet Take 10 mEq by mouth 4 (four) times daily.   11  . levothyroxine (SYNTHROID, LEVOTHROID) 25 MCG tablet Take 1 tablet by mouth daily.    . Lidocaine 0.5 % GEL Apply 4 g topically 3 (three) times daily. 170 g 6  . losartan (COZAAR) 100 MG tablet Take 1 tablet (100 mg total) by mouth daily. 90 tablet 2  . LYRICA 100 MG capsule TAKE 1 CAPSULE UP TO 3 TIMES DAILY  3  . metolazone (ZAROXOLYN) 2.5 MG tablet Take 2.5 mg by mouth. 3 TIMES A WEEK    . metoprolol succinate (TOPROL-XL) 50 MG 24 hr tablet Take 1 and 1/2 tablet (75mg ) by mouth twice a day 270 tablet 3  . ULORIC 80 MG TABS Take 1 tablet by mouth daily.  3  . Vitamin D, Ergocalciferol, (DRISDOL) 50000 UNITS CAPS Take 50,000 Units by mouth every 7 (seven) days. Takes on Thursday    . XARELTO 15 MG TABS tablet Take 15 mg by mouth daily.     No current facility-administered medications for this visit.     Allergies:   Albuterol;  Epinephrine; Penicillins; Bee venom; and Ivp dye [iodinated diagnostic agents]    Social History:  The patient  reports that she has never smoked. She has never used smokeless tobacco. She reports that she does not drink alcohol or use drugs.   Family History:  The patient's family history includes Atrial fibrillation in her father; CAD in her brother and sister; Congestive Heart Failure in her mother; Heart failure in her mother; Hypertension in her brother, father, mother, sister, and son; Stroke in her father.    ROS:   Noted in current history, otherwise review of systems is negative.  Physical Exam: Blood pressure 108/68, pulse 85, height 5\' 9"  (1.753 m), weight 280 lb (127 kg), SpO2 99 %.  GEN:  Elderly female, , obese , NAD  HEENT: Normal NECK: No JVD; No carotid bruits LYMPHATICS: No lymphadenopathy CARDIAC: irreg. Irreg.  RESPIRATORY:  Clear to auscultation without rales, wheezing or rhonchi  ABDOMEN: Soft, non-tender, non-distended MUSCULOSKELETAL:   Trace edema in feet  SKIN: Warm and dry NEUROLOGIC:  Alert and oriented x 3   EKG:      Recent Labs: 02/18/2017: B Natriuretic Peptide 300.8; BUN 36; Creatinine, Ser 1.90; Hemoglobin 13.0; Platelets 147; Potassium 3.7; Sodium 136    Lipid Panel No results found for: CHOL, TRIG, HDL, CHOLHDL, VLDL, LDLCALC, LDLDIRECT    Wt Readings from Last 3 Encounters:  08/06/17 280 lb (127 kg)  02/25/17 275 lb (124.7 kg)  02/18/17 284 lb (128.8 kg)      Other studies Reviewed: Additional studies/ records that were reviewed today include: . Review of the above records demonstrates:    ASSESSMENT AND PLAN:  1. Atrial fibrillation -      She is now off amiodarone.  The strategy is now for anticoagulation and rate control.  2.  . Chronic diastolic CHF  :  Has normal LV systolic function.   Has grade 2 diastolic dysfunction .  3. Obesity.      She has had a tough time losing weight.  Encouraged her to get back to weight  watchers   4.  CKD   - now is followed by Dr. [name].    Current medicines are reviewed at length with the patient today.  The patient does not have concerns regarding medicines.  The following changes have been made:  no change  Labs/ tests ordered today include:   No orders of the defined types were placed in this encounter.   Disposition:   FU with me in 6 months      Signed, Mertie Moores, MD  08/06/2017 2:18 PM    [facility] [place], [place], [place]  [address] Phone: [phone]; Fax: [phone]

## 2017-08-06 NOTE — Patient Instructions (Signed)
Your physician recommends that you continue on your current medications as directed. Please refer to the Current Medication list given to you today. Your physician wants you to follow-up in: 6 months with Dr. Acie Fredrickson.  You will receive a reminder letter in the mail two months in advance. If you don't receive a letter, please call our office to schedule the follow-up appointment. Your physician recommends that you return for lab work in: 6 months prior to next office visit (BMET).

## 2017-08-07 LAB — LIPID PANEL
Chol/HDL Ratio: 5.3 ratio — ABNORMAL HIGH (ref 0.0–4.4)
Cholesterol, Total: 180 mg/dL (ref 100–199)
HDL: 34 mg/dL — AB (ref >39)
LDL CALC: 91 mg/dL (ref 0–99)
Triglycerides: 273 mg/dL — ABNORMAL HIGH (ref 0–149)
VLDL CHOLESTEROL CAL: 55 mg/dL — AB (ref 5–40)

## 2017-08-07 LAB — BASIC METABOLIC PANEL
BUN / CREAT RATIO: 35 — AB (ref 12–28)
BUN: 77 mg/dL (ref 8–27)
CO2: 25 mmol/L (ref 20–29)
CREATININE: 2.19 mg/dL — AB (ref 0.57–1.00)
Calcium: 9.6 mg/dL (ref 8.7–10.3)
Chloride: 95 mmol/L — ABNORMAL LOW (ref 96–106)
GFR calc Af Amer: 26 mL/min/{1.73_m2} — ABNORMAL LOW (ref >59)
GFR calc non Af Amer: 22 mL/min/{1.73_m2} — ABNORMAL LOW (ref >59)
GLUCOSE: 258 mg/dL — AB (ref 65–99)
POTASSIUM: 3.6 mmol/L (ref 3.5–5.2)
SODIUM: 140 mmol/L (ref 134–144)

## 2017-08-07 LAB — HEPATIC FUNCTION PANEL
ALBUMIN: 4.1 g/dL (ref 3.5–4.8)
ALK PHOS: 110 IU/L (ref 39–117)
ALT: 37 IU/L — ABNORMAL HIGH (ref 0–32)
AST: 31 IU/L (ref 0–40)
Bilirubin Total: 0.8 mg/dL (ref 0.0–1.2)
Bilirubin, Direct: 0.34 mg/dL (ref 0.00–0.40)
TOTAL PROTEIN: 6.5 g/dL (ref 6.0–8.5)

## 2017-08-08 DIAGNOSIS — R69 Illness, unspecified: Secondary | ICD-10-CM | POA: Diagnosis not present

## 2017-08-09 ENCOUNTER — Telehealth: Payer: Self-pay | Admitting: Nurse Practitioner

## 2017-08-09 DIAGNOSIS — I5032 Chronic diastolic (congestive) heart failure: Secondary | ICD-10-CM

## 2017-08-09 MED ORDER — TORSEMIDE 20 MG PO TABS: 40.0000 mg | ORAL_TABLET | Freq: Two times a day (BID) | ORAL | 11 refills | Status: DC

## 2017-08-09 NOTE — Telephone Encounter (Signed)
-----   Message from Thayer Headings, MD sent at 08/09/2017  3:00 PM EDT ----- Labs suggest that she was very dehydrated.  It was 2-3 in the afternoon and she had been NPO all day  I think she may do better with torsemide rather than Forosemide / metolazone Lets DC forosemide and metolazone . Start Torsemide 40 mg BID  Recheck BMP in 2 weeks .

## 2017-08-09 NOTE — Telephone Encounter (Signed)
Reviewed lab results and plan of care with patient who verbalized understanding and agreement. She is scheduled for repeat lab appointment on Wed. June 5. I had her repeat the medication changes to me and she requests lab results be sent to Dr. [name], her nephrologist. I advised her to call back with additional questions or concerns and she thanked me for the call.

## 2017-08-16 ENCOUNTER — Telehealth: Payer: Self-pay | Admitting: Physician Assistant

## 2017-08-16 NOTE — Telephone Encounter (Signed)
Patient developed a rash after switched from lasix 80mg  BID and matolazone to torsemide 40mg  BID. Likely related to allergic reaction. Recommend go back to 80mg  BID of lasix, but continue to hold metolazone.   Kristine Corrigan PA Pager: [phone]

## 2017-08-17 DIAGNOSIS — L299 Pruritus, unspecified: Secondary | ICD-10-CM | POA: Diagnosis not present

## 2017-08-17 DIAGNOSIS — Z6841 Body Mass Index (BMI) 40.0 and over, adult: Secondary | ICD-10-CM | POA: Diagnosis not present

## 2017-08-17 MED ORDER — FUROSEMIDE 40 MG PO TABS: 80.0000 mg | ORAL_TABLET | Freq: Two times a day (BID) | ORAL | 11 refills | Status: DC

## 2017-08-17 NOTE — Addendum Note (Signed)
Addended by: Emmaline Life on: 08/17/2017 10:29 AM   Modules accepted: Orders

## 2017-08-17 NOTE — Telephone Encounter (Signed)
Medication list updated to reflect changes

## 2017-08-17 NOTE — Telephone Encounter (Signed)
Agree with plan to switch back to lasix

## 2017-08-21 ENCOUNTER — Emergency Department (HOSPITAL_COMMUNITY): Payer: Medicare HMO

## 2017-08-21 ENCOUNTER — Encounter (HOSPITAL_COMMUNITY): Payer: Self-pay | Admitting: Emergency Medicine

## 2017-08-21 ENCOUNTER — Emergency Department (HOSPITAL_COMMUNITY)
Admission: EM | Admit: 2017-08-21 | Discharge: 2017-08-21 | Disposition: A | Discharge status: Home / Self Care | Payer: Medicare HMO | Attending: Emergency Medicine | Admitting: Emergency Medicine

## 2017-08-21 ENCOUNTER — Other Ambulatory Visit: Payer: Self-pay

## 2017-08-21 DIAGNOSIS — L239 Allergic contact dermatitis, unspecified cause: Secondary | ICD-10-CM | POA: Diagnosis not present

## 2017-08-21 DIAGNOSIS — R600 Localized edema: Secondary | ICD-10-CM | POA: Diagnosis not present

## 2017-08-21 DIAGNOSIS — Z79899 Other long term (current) drug therapy: Secondary | ICD-10-CM | POA: Insufficient documentation

## 2017-08-21 DIAGNOSIS — R222 Localized swelling, mass and lump, trunk: Secondary | ICD-10-CM | POA: Diagnosis not present

## 2017-08-21 DIAGNOSIS — E7800 Pure hypercholesterolemia, unspecified: Secondary | ICD-10-CM | POA: Diagnosis not present

## 2017-08-21 DIAGNOSIS — Z7901 Long term (current) use of anticoagulants: Secondary | ICD-10-CM | POA: Insufficient documentation

## 2017-08-21 DIAGNOSIS — J45909 Unspecified asthma, uncomplicated: Secondary | ICD-10-CM | POA: Diagnosis not present

## 2017-08-21 DIAGNOSIS — E1122 Type 2 diabetes mellitus with diabetic chronic kidney disease: Secondary | ICD-10-CM | POA: Diagnosis not present

## 2017-08-21 DIAGNOSIS — I5032 Chronic diastolic (congestive) heart failure: Secondary | ICD-10-CM | POA: Insufficient documentation

## 2017-08-21 DIAGNOSIS — I480 Paroxysmal atrial fibrillation: Secondary | ICD-10-CM | POA: Diagnosis not present

## 2017-08-21 DIAGNOSIS — N184 Chronic kidney disease, stage 4 (severe): Secondary | ICD-10-CM | POA: Diagnosis not present

## 2017-08-21 DIAGNOSIS — L259 Unspecified contact dermatitis, unspecified cause: Secondary | ICD-10-CM | POA: Insufficient documentation

## 2017-08-21 DIAGNOSIS — I130 Hypertensive heart and chronic kidney disease with heart failure and stage 1 through stage 4 chronic kidney disease, or unspecified chronic kidney disease: Secondary | ICD-10-CM | POA: Diagnosis not present

## 2017-08-21 DIAGNOSIS — Z794 Long term (current) use of insulin: Secondary | ICD-10-CM | POA: Diagnosis not present

## 2017-08-21 DIAGNOSIS — I129 Hypertensive chronic kidney disease with stage 1 through stage 4 chronic kidney disease, or unspecified chronic kidney disease: Secondary | ICD-10-CM | POA: Diagnosis not present

## 2017-08-21 DIAGNOSIS — R609 Edema, unspecified: Secondary | ICD-10-CM

## 2017-08-21 LAB — CBC
HCT: 41.7 % (ref 36.0–46.0)
HEMOGLOBIN: 13.7 g/dL (ref 12.0–15.0)
MCH: 29.3 pg (ref 26.0–34.0)
MCHC: 32.9 g/dL (ref 30.0–36.0)
MCV: 89.3 fL (ref 78.0–100.0)
PLATELETS: 185 10*3/uL (ref 150–400)
RBC: 4.67 MIL/uL (ref 3.87–5.11)
RDW: 14.6 % (ref 11.5–15.5)
WBC: 11.8 10*3/uL — AB (ref 4.0–10.5)

## 2017-08-21 LAB — BASIC METABOLIC PANEL
ANION GAP: 12 (ref 5–15)
BUN: 56 mg/dL — ABNORMAL HIGH (ref 6–20)
CALCIUM: 9.5 mg/dL (ref 8.9–10.3)
CO2: 30 mmol/L (ref 22–32)
CREATININE: 2.25 mg/dL — AB (ref 0.44–1.00)
Chloride: 97 mmol/L — ABNORMAL LOW (ref 101–111)
GFR calc Af Amer: 24 mL/min — ABNORMAL LOW (ref >60)
GFR, EST NON AFRICAN AMERICAN: 21 mL/min — AB (ref >60)
Glucose, Bld: 120 mg/dL — ABNORMAL HIGH (ref 65–99)
Potassium: 3.5 mmol/L (ref 3.5–5.1)
SODIUM: 139 mmol/L (ref 135–145)

## 2017-08-21 LAB — I-STAT TROPONIN, ED: TROPONIN I, POC: 0.01 ng/mL (ref 0.00–0.08)

## 2017-08-21 MED ORDER — FAMOTIDINE IN NACL 20-0.9 MG/50ML-% IV SOLN
20.0000 mg | Freq: Once | INTRAVENOUS | Status: AC
  Administered 2017-08-21: 20 mg via INTRAVENOUS
  Filled 2017-08-21: qty 50

## 2017-08-21 MED ORDER — HYDROCERIN EX CREA
TOPICAL_CREAM | Freq: Two times a day (BID) | CUTANEOUS | Status: DC
  Filled 2017-08-21: qty 113

## 2017-08-21 MED ORDER — DIPHENHYDRAMINE HCL 50 MG/ML IJ SOLN
25.0000 mg | Freq: Once | INTRAMUSCULAR | Status: AC
  Administered 2017-08-21: 25 mg via INTRAVENOUS
  Filled 2017-08-21: qty 1

## 2017-08-21 MED ORDER — FUROSEMIDE 10 MG/ML IJ SOLN
60.0000 mg | Freq: Once | INTRAMUSCULAR | Status: AC
  Administered 2017-08-21: 60 mg via INTRAVENOUS
  Filled 2017-08-21: qty 6

## 2017-08-21 NOTE — ED Notes (Signed)
Pt states EKG was done in triage. NT unable to find EKG. EKG done

## 2017-08-21 NOTE — ED Notes (Signed)
Patient denies any Chest pain of Shortness of breathe

## 2017-08-21 NOTE — Discharge Instructions (Addendum)
Use over the counter EUCERIN cream to itchy rash.

## 2017-08-21 NOTE — ED Provider Notes (Signed)
[facility] EMERGENCY DEPARTMENT Provider Note   CSN: [id number] Arrival date & time: 08/21/17  9924     History   Chief Complaint Chief Complaint  Patient presents with  . Leg Swelling    HPI Kristine [name] is a 72 y.o. female.  Pt presents to the ED today with bilateral lower leg edema and rash to both arms and legs.  The pt was switched from lasix to demadex by her cardiologist.  She said the rash started about 2 days after the medication switch.  Pt stopped the med, but the rash continues.  The pt did see her pcp who told her to use hydrocortisone cream which has not helped.  The rash is very itchy.  The pt also has leg edema for which she is on 240 mg lasix daily.  The swelling is still bad despite keeping her legs elevated.  The pt said she can't sleep at night due to the lasix making her urinate.  She denies cough, sob, f/c.     Past Medical History:  Diagnosis Date  . Anxiety   . Asthma   . Atrial fibrillation ([place])   . CHF (congestive heart failure) ([place])   . Chronic bronchitis ([place])   . GERD (gastroesophageal reflux disease)   . High cholesterol   . History of hiatal hernia   . HTN (hypertension)   . Mild aortic sclerosis ([place])   . Morbid obesity ([place])   . Osteoporosis   . Persistent atrial fibrillation ([place])   . Scoliosis   . Spinal stenosis   . Type II diabetes mellitus ([place])   . Vitamin D deficiency     Patient Active Problem List   Diagnosis Date Noted  . Gait abnormality 02/09/2017  . Obstructive sleep apnea 02/09/2017  . Diabetic peripheral neuropathy ([place]) 09/02/2016  . Peripheral neuropathy 07/29/2016  . Spinal stenosis of lumbar region 07/29/2016  . Excessive daytime sleepiness 07/29/2016  . Morbid obesity ([place]) 07/29/2016  . 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 Community acquired pneumonia 05/31/2015  . CAP (community acquired pneumonia) 05/31/2015  . Anemia 05/31/2015  . Diastolic CHF, chronic ([place]) 05/31/2015  .  Paroxysmal atrial fibrillation ([facility])   . Obesity 10/15/2013  . Diastolic dysfunction with chronic heart failure ([place]) 06/23/2013  . Atrial fibrillation ([place]) 06/20/2010  . Pneumonia   . Abnormal cardiovascular function study   . High cholesterol   . Scoliosis   . Spinal stenosis   . Anxiety   . Osteoarthritis   . Osteoporosis   . Mild aortic sclerosis ([place])   . Degenerative disc disease   . Vitamin D deficiency   . HEMOPTYSIS UNSPECIFIED 05/12/2010  . Diabetes mellitus, type 2 ([place]) 11/19/2009  . Hypertensive cardiovascular disease 11/19/2009  . DYSPNEA ON EXERTION 11/19/2009    Past Surgical History:  Procedure Laterality Date  . BLADDER SUSPENSION  1980s  . CARDIAC CATHETERIZATION  11/16/09   SMOOTH AND NORMAL  . CARDIOVERSION N/A 01/07/2015   Procedure: CARDIOVERSION;  Surgeon: Skeet Latch, MD;  Location: [facility];  Service: Cardiovascular;  Laterality: N/A;  . CARDIOVERSION N/A 03/11/2015   Procedure: CARDIOVERSION;  Surgeon: Skeet Latch, MD;  Location: [facility];  Service: Cardiovascular;  Laterality: N/A;  . CARPAL TUNNEL RELEASE Left 1970s  . CATARACT EXTRACTION W/ INTRAOCULAR LENS  IMPLANT, BILATERAL Bilateral ~ 2010  . COLONOSCOPY  08/14/08  . FINGER FRACTURE SURGERY Right 1970s   "ring finger"  . FRACTURE SURGERY    . LAPAROSCOPIC CHOLECYSTECTOMY  1980s  . TUBAL LIGATION    . VAGINAL HYSTERECTOMY  1980     OB History   None      Home Medications    Prior to Admission medications   Medication Sig Start Date End Date Taking? Authorizing Provider  ALPRAZolam (XANAX) 0.25 MG tablet Take 0.125-0.25 mg by mouth 2 (two) times daily as needed for anxiety.  03/26/14   [provider]  BD PEN NEEDLE NANO U/F 32G X 4 MM MISC Inject 1 each as directed See admin instructions. Use pen needles with insulin pens daily 12/24/14   [provider]  clindamycin (CLEOCIN) 300 MG capsule TAKE 2 CAPSULES BY MOUTH 1 HOUR PRIOR TO DENTAL APPOINTMENT  03/02/16   [provider]  clonazePAM (KLONOPIN) 0.5 MG tablet TAKE 2 TABLETS BY MOUTH AT BEDTIME. 1-2 TABLETS AS NEEDED FOR SLEEP 08/02/17   Marcial Pacas, MD  colchicine 0.6 MG tablet Take 0.6 mg by mouth daily.    [provider]  esomeprazole (NEXIUM) 40 MG capsule Take 40 mg by mouth daily as needed (for acid reflux).     [provider]  estradiol (ESTRACE) 0.1 MG/GM vaginal cream Apply daily 12/18/16   [provider]  furosemide (LASIX) 40 MG tablet Take 2 tablets (80 mg total) by mouth 2 (two) times daily. 08/17/17   Nahser, Wonda Cheng, MD  HUMALOG KWIKPEN 200 UNIT/ML SOPN Inject 26 Units into the skin daily.  12/13/14   [provider]  insulin detemir (LEVEMIR) 100 UNIT/ML injection Inject 70 Units into the skin 2 (two) times daily.     [provider]  KLOR-CON 10 10 MEQ tablet Take 10 mEq by mouth 4 (four) times daily.  05/15/14   [provider]  levothyroxine (SYNTHROID, LEVOTHROID) 25 MCG tablet Take 1 tablet by mouth daily. 10/24/15   [provider]  Lidocaine 0.5 % GEL Apply 4 g topically 3 (three) times daily. 07/29/16   Marcial Pacas, MD  losartan (COZAAR) 100 MG tablet Take 1 tablet (100 mg total) by mouth daily. 04/30/17   Nahser, Wonda Cheng, MD  LYRICA 100 MG capsule TAKE 1 CAPSULE UP TO 3 TIMES DAILY 06/01/17   [provider]  metoprolol succinate (TOPROL-XL) 50 MG 24 hr tablet Take 1 and 1/2 tablet (75mg ) by mouth twice a day 03/03/17   Nahser, Wonda Cheng, MD  ULORIC 80 MG TABS Take 1 tablet by mouth daily. 04/29/17   [provider]  Vitamin D, Ergocalciferol, (DRISDOL) 50000 UNITS CAPS Take 50,000 Units by mouth every 7 (seven) days. Takes on Thursday    [provider]  XARELTO 15 MG TABS tablet Take 15 mg by mouth daily. 06/27/16   [provider]    Family History Family History  Problem Relation Age of Onset  . Stroke Father   . Hypertension Father   . Atrial fibrillation Father          HAD MURMUR  . Heart failure Mother   . Congestive Heart Failure Mother   . Hypertension Mother   . Hypertension Brother   . Hypertension Sister   . Hypertension Son   . CAD Sister        EARLY  . CAD Brother        EARLY    Social History Social History   Tobacco Use  . Smoking status: Never Smoker  . Smokeless tobacco: Never Used  Substance Use Topics  . Alcohol use: No  . Drug use:  No     Allergies   Albuterol; Epinephrine; Penicillins; Bee venom; and Ivp dye [iodinated diagnostic agents]   Review of Systems Review of Systems  Musculoskeletal:       Bilateral leg swelling  Skin: Positive for rash.  All other systems reviewed and are negative.    Physical Exam Updated Vital Signs BP 134/74 (BP Location: Left Arm)   Pulse 95   Temp 97.8 F (36.6 C) (Oral)   Ht 5\' 9"  (1.753 m)   Wt 131.5 kg (290 lb)   SpO2 95%   BMI 42.83 kg/m   Physical Exam  Constitutional: She is oriented to person, place, and time. She appears well-developed and well-nourished.  HENT:  Head: Normocephalic and atraumatic.  Right Ear: External ear normal.  Left Ear: External ear normal.  Nose: Nose normal.  Mouth/Throat: Oropharynx is clear and moist.  Eyes: Pupils are equal, round, and reactive to light. Conjunctivae and EOM are normal.  Neck: Normal range of motion. Neck supple.  Cardiovascular: Normal rate, regular rhythm, normal heart sounds and intact distal pulses.  Pulmonary/Chest: Effort normal and breath sounds normal.  Abdominal: Soft. Bowel sounds are normal.  Musculoskeletal: She exhibits edema.  Neurological: She is alert and oriented to person, place, and time.  Skin: Skin is warm. Capillary refill takes less than 2 seconds.  Itchy rash to both arms and legs  Psychiatric: She has a normal mood and affect. Her behavior is normal. Judgment and thought content normal.  Nursing note and vitals reviewed.    ED Treatments / Results  Labs (all labs ordered are  listed, but only abnormal results are displayed) Labs Reviewed  BASIC METABOLIC PANEL - Abnormal; Notable for the following components:      Result Value   Chloride 97 (*)    Glucose, Bld 120 (*)    BUN 56 (*)    Creatinine, Ser 2.25 (*)    GFR calc non Af Amer 21 (*)    GFR calc Af Amer 24 (*)    All other components within normal limits  CBC - Abnormal; Notable for the following components:   WBC 11.8 (*)    All other components within normal limits  I-STAT TROPONIN, ED    EKG None  Radiology Dg Chest 2 View  Result Date: 08/21/2017 CLINICAL DATA:  Patient complaining of leg swelling and itching for approximately 1 week. History of CHF. EXAM: CHEST - 2 VIEW COMPARISON:  02/18/2017. FINDINGS: Mild enlargement of the cardiopericardial silhouette. No mediastinal or hilar masses. No evidence of adenopathy. Prominent bronchovascular markings without change. Lungs otherwise clear. No convincing pneumonia or pulmonary edema. No pleural effusion or pneumothorax. Skeletal structures are intact. IMPRESSION: No acute cardiopulmonary disease. Electronically Signed   By: Lajean Manes M.D.   On: 08/21/2017 07:07    Procedures Procedures (including critical care time)  Medications Ordered in ED Medications  hydrocerin (EUCERIN) cream (has no administration in time range)  furosemide (LASIX) injection 60 mg (60 mg Intravenous Given 08/21/17 0834)  diphenhydrAMINE (BENADRYL) injection 25 mg (25 mg Intravenous Given 08/21/17 0834)  famotidine (PEPCID) IVPB 20 mg premix (20 mg Intravenous New Bag/Given 08/21/17 0834)     Initial Impression / Assessment and Plan / ED Course  I have reviewed the triage vital signs and the nursing notes.  Pertinent labs & imaging results that were available during my care of the patient were reviewed by me and considered in my medical decision making (see chart for details).  Renal function is stable.  Pt's rash is feeling better.  Pt is stable for d/c.  Return  if worse and f/u with pcp.  Final Clinical Impressions(s) / ED Diagnoses   Final diagnoses:  Peripheral edema  Contact dermatitis, unspecified contact dermatitis type, unspecified trigger  CKD (chronic kidney disease) stage 4, GFR 15-29 ml/min [facility])    ED Discharge Orders    None       Isla Pence, MD 08/21/17 [phone]

## 2017-08-21 NOTE — ED Triage Notes (Signed)
Patient presents to the ED with complaints of bilateral Lower leg Edema x1 week. Patient reports she has HF. Patient reports she has taking lasix and has not missed any dose. Patient reports she has increase her pillows. Unable to ambulate due to increase swelling. Patient reports saw PCP on Tuesday for similar complaints. Patient reports she just wants to know what her kidney level is.

## 2017-08-23 DIAGNOSIS — I509 Heart failure, unspecified: Secondary | ICD-10-CM | POA: Diagnosis not present

## 2017-08-23 DIAGNOSIS — E1165 Type 2 diabetes mellitus with hyperglycemia: Secondary | ICD-10-CM | POA: Diagnosis not present

## 2017-08-23 DIAGNOSIS — L299 Pruritus, unspecified: Secondary | ICD-10-CM | POA: Diagnosis not present

## 2017-08-23 DIAGNOSIS — Z6841 Body Mass Index (BMI) 40.0 and over, adult: Secondary | ICD-10-CM | POA: Diagnosis not present

## 2017-08-23 DIAGNOSIS — N184 Chronic kidney disease, stage 4 (severe): Secondary | ICD-10-CM | POA: Diagnosis not present

## 2017-08-25 ENCOUNTER — Other Ambulatory Visit: Payer: Medicare HMO

## 2017-08-25 ENCOUNTER — Ambulatory Visit
Admission: RE | Admit: 2017-08-25 | Discharge: 2017-08-25 | Disposition: A | Discharge status: Home / Self Care | Payer: Medicare HMO | Source: Ambulatory Visit | Attending: Internal Medicine | Admitting: Internal Medicine

## 2017-08-25 DIAGNOSIS — Z1231 Encounter for screening mammogram for malignant neoplasm of breast: Secondary | ICD-10-CM | POA: Diagnosis not present

## 2017-08-25 DIAGNOSIS — N184 Chronic kidney disease, stage 4 (severe): Secondary | ICD-10-CM | POA: Diagnosis not present

## 2017-08-30 DIAGNOSIS — E8770 Fluid overload, unspecified: Secondary | ICD-10-CM | POA: Diagnosis not present

## 2017-08-30 DIAGNOSIS — N184 Chronic kidney disease, stage 4 (severe): Secondary | ICD-10-CM | POA: Diagnosis not present

## 2017-08-30 DIAGNOSIS — E1122 Type 2 diabetes mellitus with diabetic chronic kidney disease: Secondary | ICD-10-CM | POA: Diagnosis not present

## 2017-08-30 DIAGNOSIS — I129 Hypertensive chronic kidney disease with stage 1 through stage 4 chronic kidney disease, or unspecified chronic kidney disease: Secondary | ICD-10-CM | POA: Diagnosis not present

## 2017-08-30 DIAGNOSIS — R21 Rash and other nonspecific skin eruption: Secondary | ICD-10-CM | POA: Diagnosis not present

## 2017-08-30 DIAGNOSIS — Z6841 Body Mass Index (BMI) 40.0 and over, adult: Secondary | ICD-10-CM | POA: Diagnosis not present

## 2017-08-31 DIAGNOSIS — N184 Chronic kidney disease, stage 4 (severe): Secondary | ICD-10-CM | POA: Diagnosis not present

## 2017-09-06 DIAGNOSIS — N184 Chronic kidney disease, stage 4 (severe): Secondary | ICD-10-CM | POA: Diagnosis not present

## 2017-09-06 DIAGNOSIS — Z6841 Body Mass Index (BMI) 40.0 and over, adult: Secondary | ICD-10-CM | POA: Diagnosis not present

## 2017-09-06 DIAGNOSIS — E1165 Type 2 diabetes mellitus with hyperglycemia: Secondary | ICD-10-CM | POA: Diagnosis not present

## 2017-09-06 DIAGNOSIS — L299 Pruritus, unspecified: Secondary | ICD-10-CM | POA: Diagnosis not present

## 2017-09-08 DIAGNOSIS — N184 Chronic kidney disease, stage 4 (severe): Secondary | ICD-10-CM | POA: Diagnosis not present

## 2017-09-10 DIAGNOSIS — R531 Weakness: Secondary | ICD-10-CM | POA: Diagnosis not present

## 2017-09-10 DIAGNOSIS — L299 Pruritus, unspecified: Secondary | ICD-10-CM | POA: Diagnosis not present

## 2017-09-10 DIAGNOSIS — N183 Chronic kidney disease, stage 3 (moderate): Secondary | ICD-10-CM | POA: Diagnosis not present

## 2017-09-10 DIAGNOSIS — B372 Candidiasis of skin and nail: Secondary | ICD-10-CM | POA: Diagnosis not present

## 2017-09-10 DIAGNOSIS — E1159 Type 2 diabetes mellitus with other circulatory complications: Secondary | ICD-10-CM | POA: Diagnosis not present

## 2017-09-16 DIAGNOSIS — E7849 Other hyperlipidemia: Secondary | ICD-10-CM | POA: Diagnosis not present

## 2017-09-16 DIAGNOSIS — I4891 Unspecified atrial fibrillation: Secondary | ICD-10-CM | POA: Diagnosis not present

## 2017-09-16 DIAGNOSIS — R69 Illness, unspecified: Secondary | ICD-10-CM | POA: Diagnosis not present

## 2017-09-16 DIAGNOSIS — E785 Hyperlipidemia, unspecified: Secondary | ICD-10-CM | POA: Diagnosis not present

## 2017-09-16 DIAGNOSIS — M519 Unspecified thoracic, thoracolumbar and lumbosacral intervertebral disc disorder: Secondary | ICD-10-CM | POA: Diagnosis not present

## 2017-09-16 DIAGNOSIS — K219 Gastro-esophageal reflux disease without esophagitis: Secondary | ICD-10-CM | POA: Diagnosis not present

## 2017-09-16 DIAGNOSIS — E1122 Type 2 diabetes mellitus with diabetic chronic kidney disease: Secondary | ICD-10-CM | POA: Diagnosis not present

## 2017-09-16 DIAGNOSIS — N184 Chronic kidney disease, stage 4 (severe): Secondary | ICD-10-CM | POA: Diagnosis not present

## 2017-09-16 DIAGNOSIS — I129 Hypertensive chronic kidney disease with stage 1 through stage 4 chronic kidney disease, or unspecified chronic kidney disease: Secondary | ICD-10-CM | POA: Diagnosis not present

## 2017-09-16 DIAGNOSIS — E1165 Type 2 diabetes mellitus with hyperglycemia: Secondary | ICD-10-CM | POA: Diagnosis not present

## 2017-09-16 DIAGNOSIS — M810 Age-related osteoporosis without current pathological fracture: Secondary | ICD-10-CM | POA: Diagnosis not present

## 2017-09-20 DIAGNOSIS — E1165 Type 2 diabetes mellitus with hyperglycemia: Secondary | ICD-10-CM | POA: Diagnosis not present

## 2017-09-20 DIAGNOSIS — M519 Unspecified thoracic, thoracolumbar and lumbosacral intervertebral disc disorder: Secondary | ICD-10-CM | POA: Diagnosis not present

## 2017-09-20 DIAGNOSIS — I4891 Unspecified atrial fibrillation: Secondary | ICD-10-CM | POA: Diagnosis not present

## 2017-09-20 DIAGNOSIS — N184 Chronic kidney disease, stage 4 (severe): Secondary | ICD-10-CM | POA: Diagnosis not present

## 2017-09-20 DIAGNOSIS — E785 Hyperlipidemia, unspecified: Secondary | ICD-10-CM | POA: Diagnosis not present

## 2017-09-20 DIAGNOSIS — M810 Age-related osteoporosis without current pathological fracture: Secondary | ICD-10-CM | POA: Diagnosis not present

## 2017-09-20 DIAGNOSIS — K219 Gastro-esophageal reflux disease without esophagitis: Secondary | ICD-10-CM | POA: Diagnosis not present

## 2017-09-20 DIAGNOSIS — R69 Illness, unspecified: Secondary | ICD-10-CM | POA: Diagnosis not present

## 2017-09-20 DIAGNOSIS — I129 Hypertensive chronic kidney disease with stage 1 through stage 4 chronic kidney disease, or unspecified chronic kidney disease: Secondary | ICD-10-CM | POA: Diagnosis not present

## 2017-09-20 DIAGNOSIS — E1122 Type 2 diabetes mellitus with diabetic chronic kidney disease: Secondary | ICD-10-CM | POA: Diagnosis not present

## 2017-09-22 DIAGNOSIS — M810 Age-related osteoporosis without current pathological fracture: Secondary | ICD-10-CM | POA: Diagnosis not present

## 2017-09-22 DIAGNOSIS — N184 Chronic kidney disease, stage 4 (severe): Secondary | ICD-10-CM | POA: Diagnosis not present

## 2017-09-22 DIAGNOSIS — M519 Unspecified thoracic, thoracolumbar and lumbosacral intervertebral disc disorder: Secondary | ICD-10-CM | POA: Diagnosis not present

## 2017-09-22 DIAGNOSIS — E1165 Type 2 diabetes mellitus with hyperglycemia: Secondary | ICD-10-CM | POA: Diagnosis not present

## 2017-09-22 DIAGNOSIS — E785 Hyperlipidemia, unspecified: Secondary | ICD-10-CM | POA: Diagnosis not present

## 2017-09-22 DIAGNOSIS — R69 Illness, unspecified: Secondary | ICD-10-CM | POA: Diagnosis not present

## 2017-09-22 DIAGNOSIS — E1122 Type 2 diabetes mellitus with diabetic chronic kidney disease: Secondary | ICD-10-CM | POA: Diagnosis not present

## 2017-09-22 DIAGNOSIS — K219 Gastro-esophageal reflux disease without esophagitis: Secondary | ICD-10-CM | POA: Diagnosis not present

## 2017-09-22 DIAGNOSIS — I129 Hypertensive chronic kidney disease with stage 1 through stage 4 chronic kidney disease, or unspecified chronic kidney disease: Secondary | ICD-10-CM | POA: Diagnosis not present

## 2017-09-22 DIAGNOSIS — I4891 Unspecified atrial fibrillation: Secondary | ICD-10-CM | POA: Diagnosis not present

## 2017-09-24 DIAGNOSIS — N184 Chronic kidney disease, stage 4 (severe): Secondary | ICD-10-CM | POA: Diagnosis not present

## 2017-09-27 DIAGNOSIS — N184 Chronic kidney disease, stage 4 (severe): Secondary | ICD-10-CM | POA: Diagnosis not present

## 2017-09-27 DIAGNOSIS — I129 Hypertensive chronic kidney disease with stage 1 through stage 4 chronic kidney disease, or unspecified chronic kidney disease: Secondary | ICD-10-CM | POA: Diagnosis not present

## 2017-09-27 DIAGNOSIS — E8770 Fluid overload, unspecified: Secondary | ICD-10-CM | POA: Diagnosis not present

## 2017-09-27 DIAGNOSIS — E1122 Type 2 diabetes mellitus with diabetic chronic kidney disease: Secondary | ICD-10-CM | POA: Diagnosis not present

## 2017-09-27 DIAGNOSIS — R21 Rash and other nonspecific skin eruption: Secondary | ICD-10-CM | POA: Diagnosis not present

## 2017-09-27 DIAGNOSIS — N10 Acute tubulo-interstitial nephritis: Secondary | ICD-10-CM | POA: Diagnosis not present

## 2017-09-27 DIAGNOSIS — Z6841 Body Mass Index (BMI) 40.0 and over, adult: Secondary | ICD-10-CM | POA: Diagnosis not present

## 2017-09-28 DIAGNOSIS — M519 Unspecified thoracic, thoracolumbar and lumbosacral intervertebral disc disorder: Secondary | ICD-10-CM | POA: Diagnosis not present

## 2017-09-28 DIAGNOSIS — R69 Illness, unspecified: Secondary | ICD-10-CM | POA: Diagnosis not present

## 2017-09-28 DIAGNOSIS — E1165 Type 2 diabetes mellitus with hyperglycemia: Secondary | ICD-10-CM | POA: Diagnosis not present

## 2017-09-28 DIAGNOSIS — E785 Hyperlipidemia, unspecified: Secondary | ICD-10-CM | POA: Diagnosis not present

## 2017-09-28 DIAGNOSIS — N184 Chronic kidney disease, stage 4 (severe): Secondary | ICD-10-CM | POA: Diagnosis not present

## 2017-09-28 DIAGNOSIS — I129 Hypertensive chronic kidney disease with stage 1 through stage 4 chronic kidney disease, or unspecified chronic kidney disease: Secondary | ICD-10-CM | POA: Diagnosis not present

## 2017-09-28 DIAGNOSIS — I4891 Unspecified atrial fibrillation: Secondary | ICD-10-CM | POA: Diagnosis not present

## 2017-09-28 DIAGNOSIS — M810 Age-related osteoporosis without current pathological fracture: Secondary | ICD-10-CM | POA: Diagnosis not present

## 2017-09-28 DIAGNOSIS — E1122 Type 2 diabetes mellitus with diabetic chronic kidney disease: Secondary | ICD-10-CM | POA: Diagnosis not present

## 2017-09-28 DIAGNOSIS — K219 Gastro-esophageal reflux disease without esophagitis: Secondary | ICD-10-CM | POA: Diagnosis not present

## 2017-10-04 DIAGNOSIS — M519 Unspecified thoracic, thoracolumbar and lumbosacral intervertebral disc disorder: Secondary | ICD-10-CM | POA: Diagnosis not present

## 2017-10-04 DIAGNOSIS — I4891 Unspecified atrial fibrillation: Secondary | ICD-10-CM | POA: Diagnosis not present

## 2017-10-04 DIAGNOSIS — N184 Chronic kidney disease, stage 4 (severe): Secondary | ICD-10-CM | POA: Diagnosis not present

## 2017-10-04 DIAGNOSIS — I129 Hypertensive chronic kidney disease with stage 1 through stage 4 chronic kidney disease, or unspecified chronic kidney disease: Secondary | ICD-10-CM | POA: Diagnosis not present

## 2017-10-04 DIAGNOSIS — E1122 Type 2 diabetes mellitus with diabetic chronic kidney disease: Secondary | ICD-10-CM | POA: Diagnosis not present

## 2017-10-04 DIAGNOSIS — E785 Hyperlipidemia, unspecified: Secondary | ICD-10-CM | POA: Diagnosis not present

## 2017-10-04 DIAGNOSIS — R69 Illness, unspecified: Secondary | ICD-10-CM | POA: Diagnosis not present

## 2017-10-04 DIAGNOSIS — M810 Age-related osteoporosis without current pathological fracture: Secondary | ICD-10-CM | POA: Diagnosis not present

## 2017-10-04 DIAGNOSIS — K219 Gastro-esophageal reflux disease without esophagitis: Secondary | ICD-10-CM | POA: Diagnosis not present

## 2017-10-04 DIAGNOSIS — E1165 Type 2 diabetes mellitus with hyperglycemia: Secondary | ICD-10-CM | POA: Diagnosis not present

## 2017-10-05 DIAGNOSIS — R69 Illness, unspecified: Secondary | ICD-10-CM | POA: Diagnosis not present

## 2017-10-08 DIAGNOSIS — E109 Type 1 diabetes mellitus without complications: Secondary | ICD-10-CM | POA: Diagnosis not present

## 2017-10-08 DIAGNOSIS — Z794 Long term (current) use of insulin: Secondary | ICD-10-CM | POA: Diagnosis not present

## 2017-10-10 ENCOUNTER — Other Ambulatory Visit: Payer: Self-pay | Admitting: Neurology

## 2017-10-11 ENCOUNTER — Other Ambulatory Visit: Payer: Self-pay | Admitting: Cardiovascular Disease

## 2017-10-11 DIAGNOSIS — I4819 Other persistent atrial fibrillation: Secondary | ICD-10-CM

## 2017-10-12 ENCOUNTER — Telehealth: Payer: Self-pay | Admitting: Neurology

## 2017-10-12 DIAGNOSIS — E1122 Type 2 diabetes mellitus with diabetic chronic kidney disease: Secondary | ICD-10-CM | POA: Diagnosis not present

## 2017-10-12 DIAGNOSIS — N184 Chronic kidney disease, stage 4 (severe): Secondary | ICD-10-CM | POA: Diagnosis not present

## 2017-10-12 DIAGNOSIS — M519 Unspecified thoracic, thoracolumbar and lumbosacral intervertebral disc disorder: Secondary | ICD-10-CM | POA: Diagnosis not present

## 2017-10-12 DIAGNOSIS — I129 Hypertensive chronic kidney disease with stage 1 through stage 4 chronic kidney disease, or unspecified chronic kidney disease: Secondary | ICD-10-CM | POA: Diagnosis not present

## 2017-10-12 DIAGNOSIS — I4891 Unspecified atrial fibrillation: Secondary | ICD-10-CM | POA: Diagnosis not present

## 2017-10-12 DIAGNOSIS — K219 Gastro-esophageal reflux disease without esophagitis: Secondary | ICD-10-CM | POA: Diagnosis not present

## 2017-10-12 DIAGNOSIS — M810 Age-related osteoporosis without current pathological fracture: Secondary | ICD-10-CM | POA: Diagnosis not present

## 2017-10-12 DIAGNOSIS — E1165 Type 2 diabetes mellitus with hyperglycemia: Secondary | ICD-10-CM | POA: Diagnosis not present

## 2017-10-12 DIAGNOSIS — E785 Hyperlipidemia, unspecified: Secondary | ICD-10-CM | POA: Diagnosis not present

## 2017-10-12 DIAGNOSIS — R69 Illness, unspecified: Secondary | ICD-10-CM | POA: Diagnosis not present

## 2017-10-12 NOTE — Telephone Encounter (Signed)
I have refilled her clonazepam 0.5 mg 1 to 2 tablets every night as needed, she may contact her primary care physician for continued refill,  She has no follow-up appointment with [place],

## 2017-10-12 NOTE — Telephone Encounter (Signed)
PCP to refill future clonazepam prescriptions.

## 2017-10-13 DIAGNOSIS — N184 Chronic kidney disease, stage 4 (severe): Secondary | ICD-10-CM | POA: Diagnosis not present

## 2017-10-13 DIAGNOSIS — R69 Illness, unspecified: Secondary | ICD-10-CM | POA: Diagnosis not present

## 2017-10-13 DIAGNOSIS — I129 Hypertensive chronic kidney disease with stage 1 through stage 4 chronic kidney disease, or unspecified chronic kidney disease: Secondary | ICD-10-CM | POA: Diagnosis not present

## 2017-10-13 DIAGNOSIS — I4891 Unspecified atrial fibrillation: Secondary | ICD-10-CM | POA: Diagnosis not present

## 2017-10-13 DIAGNOSIS — K219 Gastro-esophageal reflux disease without esophagitis: Secondary | ICD-10-CM | POA: Diagnosis not present

## 2017-10-13 DIAGNOSIS — E1165 Type 2 diabetes mellitus with hyperglycemia: Secondary | ICD-10-CM | POA: Diagnosis not present

## 2017-10-13 DIAGNOSIS — E1122 Type 2 diabetes mellitus with diabetic chronic kidney disease: Secondary | ICD-10-CM | POA: Diagnosis not present

## 2017-10-13 DIAGNOSIS — E785 Hyperlipidemia, unspecified: Secondary | ICD-10-CM | POA: Diagnosis not present

## 2017-10-13 DIAGNOSIS — M519 Unspecified thoracic, thoracolumbar and lumbosacral intervertebral disc disorder: Secondary | ICD-10-CM | POA: Diagnosis not present

## 2017-10-13 DIAGNOSIS — M810 Age-related osteoporosis without current pathological fracture: Secondary | ICD-10-CM | POA: Diagnosis not present

## 2017-10-15 DIAGNOSIS — M810 Age-related osteoporosis without current pathological fracture: Secondary | ICD-10-CM | POA: Diagnosis not present

## 2017-10-15 DIAGNOSIS — E1122 Type 2 diabetes mellitus with diabetic chronic kidney disease: Secondary | ICD-10-CM | POA: Diagnosis not present

## 2017-10-15 DIAGNOSIS — M519 Unspecified thoracic, thoracolumbar and lumbosacral intervertebral disc disorder: Secondary | ICD-10-CM | POA: Diagnosis not present

## 2017-10-15 DIAGNOSIS — N184 Chronic kidney disease, stage 4 (severe): Secondary | ICD-10-CM | POA: Diagnosis not present

## 2017-10-15 DIAGNOSIS — R69 Illness, unspecified: Secondary | ICD-10-CM | POA: Diagnosis not present

## 2017-10-15 DIAGNOSIS — I4891 Unspecified atrial fibrillation: Secondary | ICD-10-CM | POA: Diagnosis not present

## 2017-10-15 DIAGNOSIS — K219 Gastro-esophageal reflux disease without esophagitis: Secondary | ICD-10-CM | POA: Diagnosis not present

## 2017-10-15 DIAGNOSIS — E1165 Type 2 diabetes mellitus with hyperglycemia: Secondary | ICD-10-CM | POA: Diagnosis not present

## 2017-10-15 DIAGNOSIS — I129 Hypertensive chronic kidney disease with stage 1 through stage 4 chronic kidney disease, or unspecified chronic kidney disease: Secondary | ICD-10-CM | POA: Diagnosis not present

## 2017-10-15 DIAGNOSIS — E785 Hyperlipidemia, unspecified: Secondary | ICD-10-CM | POA: Diagnosis not present

## 2017-10-18 ENCOUNTER — Ambulatory Visit: Payer: Medicare HMO | Admitting: "Endocrinology

## 2017-10-20 DIAGNOSIS — N184 Chronic kidney disease, stage 4 (severe): Secondary | ICD-10-CM | POA: Diagnosis not present

## 2017-10-20 DIAGNOSIS — K219 Gastro-esophageal reflux disease without esophagitis: Secondary | ICD-10-CM | POA: Diagnosis not present

## 2017-10-20 DIAGNOSIS — E785 Hyperlipidemia, unspecified: Secondary | ICD-10-CM | POA: Diagnosis not present

## 2017-10-20 DIAGNOSIS — E1122 Type 2 diabetes mellitus with diabetic chronic kidney disease: Secondary | ICD-10-CM | POA: Diagnosis not present

## 2017-10-20 DIAGNOSIS — M810 Age-related osteoporosis without current pathological fracture: Secondary | ICD-10-CM | POA: Diagnosis not present

## 2017-10-20 DIAGNOSIS — M519 Unspecified thoracic, thoracolumbar and lumbosacral intervertebral disc disorder: Secondary | ICD-10-CM | POA: Diagnosis not present

## 2017-10-20 DIAGNOSIS — R69 Illness, unspecified: Secondary | ICD-10-CM | POA: Diagnosis not present

## 2017-10-20 DIAGNOSIS — E1165 Type 2 diabetes mellitus with hyperglycemia: Secondary | ICD-10-CM | POA: Diagnosis not present

## 2017-10-20 DIAGNOSIS — I4891 Unspecified atrial fibrillation: Secondary | ICD-10-CM | POA: Diagnosis not present

## 2017-10-20 DIAGNOSIS — I129 Hypertensive chronic kidney disease with stage 1 through stage 4 chronic kidney disease, or unspecified chronic kidney disease: Secondary | ICD-10-CM | POA: Diagnosis not present

## 2017-10-26 DIAGNOSIS — H04123 Dry eye syndrome of bilateral lacrimal glands: Secondary | ICD-10-CM | POA: Diagnosis not present

## 2017-10-26 DIAGNOSIS — E119 Type 2 diabetes mellitus without complications: Secondary | ICD-10-CM | POA: Diagnosis not present

## 2017-10-26 DIAGNOSIS — Z961 Presence of intraocular lens: Secondary | ICD-10-CM | POA: Diagnosis not present

## 2017-10-28 DIAGNOSIS — Z794 Long term (current) use of insulin: Secondary | ICD-10-CM | POA: Diagnosis not present

## 2017-10-28 DIAGNOSIS — N184 Chronic kidney disease, stage 4 (severe): Secondary | ICD-10-CM | POA: Diagnosis not present

## 2017-10-28 DIAGNOSIS — I10 Essential (primary) hypertension: Secondary | ICD-10-CM | POA: Diagnosis not present

## 2017-10-28 DIAGNOSIS — E1165 Type 2 diabetes mellitus with hyperglycemia: Secondary | ICD-10-CM | POA: Diagnosis not present

## 2017-11-04 DIAGNOSIS — N184 Chronic kidney disease, stage 4 (severe): Secondary | ICD-10-CM | POA: Diagnosis not present

## 2017-11-08 DIAGNOSIS — Z6841 Body Mass Index (BMI) 40.0 and over, adult: Secondary | ICD-10-CM | POA: Diagnosis not present

## 2017-11-08 DIAGNOSIS — E8770 Fluid overload, unspecified: Secondary | ICD-10-CM | POA: Diagnosis not present

## 2017-11-08 DIAGNOSIS — E1122 Type 2 diabetes mellitus with diabetic chronic kidney disease: Secondary | ICD-10-CM | POA: Diagnosis not present

## 2017-11-08 DIAGNOSIS — N184 Chronic kidney disease, stage 4 (severe): Secondary | ICD-10-CM | POA: Diagnosis not present

## 2017-11-08 DIAGNOSIS — I129 Hypertensive chronic kidney disease with stage 1 through stage 4 chronic kidney disease, or unspecified chronic kidney disease: Secondary | ICD-10-CM | POA: Diagnosis not present

## 2017-11-08 DIAGNOSIS — N10 Acute tubulo-interstitial nephritis: Secondary | ICD-10-CM | POA: Diagnosis not present

## 2017-11-08 DIAGNOSIS — R21 Rash and other nonspecific skin eruption: Secondary | ICD-10-CM | POA: Diagnosis not present

## 2017-11-09 ENCOUNTER — Ambulatory Visit: Payer: Medicare HMO | Admitting: "Endocrinology

## 2017-11-11 DIAGNOSIS — Z794 Long term (current) use of insulin: Secondary | ICD-10-CM | POA: Diagnosis not present

## 2017-11-11 DIAGNOSIS — E109 Type 1 diabetes mellitus without complications: Secondary | ICD-10-CM | POA: Diagnosis not present

## 2017-11-15 DIAGNOSIS — T1490XA Injury, unspecified, initial encounter: Secondary | ICD-10-CM | POA: Diagnosis not present

## 2017-11-15 DIAGNOSIS — E1165 Type 2 diabetes mellitus with hyperglycemia: Secondary | ICD-10-CM | POA: Diagnosis not present

## 2017-11-15 DIAGNOSIS — L03116 Cellulitis of left lower limb: Secondary | ICD-10-CM | POA: Diagnosis not present

## 2017-11-17 DIAGNOSIS — N184 Chronic kidney disease, stage 4 (severe): Secondary | ICD-10-CM | POA: Diagnosis not present

## 2017-11-24 ENCOUNTER — Ambulatory Visit: Payer: Medicare HMO | Admitting: Cardiovascular Disease

## 2017-11-25 ENCOUNTER — Telehealth: Payer: Self-pay | Admitting: Nutrition

## 2017-11-25 ENCOUNTER — Encounter: Payer: Self-pay | Admitting: Nutrition

## 2017-11-25 ENCOUNTER — Encounter: Payer: Medicare HMO | Attending: Internal Medicine | Admitting: Nutrition

## 2017-11-25 VITALS — Ht 69.0 in | Wt 279.0 lb

## 2017-11-25 DIAGNOSIS — M109 Gout, unspecified: Secondary | ICD-10-CM | POA: Diagnosis not present

## 2017-11-25 DIAGNOSIS — Z713 Dietary counseling and surveillance: Secondary | ICD-10-CM | POA: Diagnosis not present

## 2017-11-25 DIAGNOSIS — E118 Type 2 diabetes mellitus with unspecified complications: Secondary | ICD-10-CM

## 2017-11-25 DIAGNOSIS — E1165 Type 2 diabetes mellitus with hyperglycemia: Secondary | ICD-10-CM

## 2017-11-25 DIAGNOSIS — N189 Chronic kidney disease, unspecified: Secondary | ICD-10-CM | POA: Insufficient documentation

## 2017-11-25 DIAGNOSIS — E669 Obesity, unspecified: Secondary | ICD-10-CM

## 2017-11-25 DIAGNOSIS — E1322 Other specified diabetes mellitus with diabetic chronic kidney disease: Secondary | ICD-10-CM | POA: Diagnosis not present

## 2017-11-25 DIAGNOSIS — IMO0002 Reserved for concepts with insufficient information to code with codable children: Secondary | ICD-10-CM

## 2017-11-25 NOTE — Telephone Encounter (Signed)
Tc To Dr. Avva's office regarding pt's insulin regimen and high blood sugars. Pt is reported to be taking 85 units of Levermir with Breakfast, 50 units with lunch and 85 units at bed time inaddition to 30 units of Novolog with meals plus sliding scale. Libre reveals AVG 228. Has CKD Stg 5 and eGFR 18. Requesed call back to discuss better insulin regimen. 

## 2017-11-25 NOTE — Patient Instructions (Addendum)
Goals 1. Eat three meals per day 2. Cut out snacks. 3  Make appt with Dr. Dorris Fetch or an endocrinologist 4. Drink only water Follow THe Plate Method  Keep a food journal and BS Log and bring at next visit.

## 2017-11-25 NOTE — Progress Notes (Addendum)
Medical Nutrition Therapy:  Appt start time: 1400 end time:  1430 Assessment:  Primary concerns today: F/u. Sees Dr. Dagmar Hait and NP at [facility]. Has been seeing [facility], Dr. [name] in [place] monthly for labs to montoring kidney function. She is frustrated to figure out what to eat due to CKD and DM and Gout. Taking her GOUT meds now that were prescribed. Last A1C was ? She can't remember.11/17/17 labs from [facility] [place] 2.56 mg/dl with 18 ml/min eGFR. She has stopped Metformin recently due to CKD.Marland Kitchen Blood thinner reduced to  15 mg. Currently taking Levemir 85 units with breakfast, 50 unit with lunch and 85 units with dinner and 30 units of Novolog with sliding scale with meals for a total of 310 units of insulin per day.  Currently 70% of her insulin is being given as long acting insulin and 30% short acting.  Would be better to have 50% of her insulin long acting; 155 units a day and then Novolog 50 units with meals plus sliding scale for better blood sugar control.  Elenor Legato shows avg bs 228 mg.dl. 65% of BS are above target and  34% in range with 1% low BS. She reports signs of high blood sugars; increased thirst, hunger and fatigue and frequent urination. She has lost 10 lbs .  Frustrated to know what to eat with her CKD, DM and Gout. Motivated to learn how to improve her DM. Hasn't met with an endocrinologist. Has a Pharm D at Dr. Gabriel Carina .   BS poorly controlled contributing to her CKD.        Preferred Learning Style:   No preference indicated   Learning Readiness:   Ready  MEDICATIONS: see list    DIETARY INTAKE:  24-hr recall:  Eating 2-3 meals per day.   Usual physical activity: walks or plays ball with grandchildren for 10 minutes 3 x day   Estimated energy needs: 1600 calories 180 g carbohydrates 120 g protein 44 g fat  Progress Towards Goal(s):  In progress.   Nutritional Diagnosis:  NI-5.10.2 Excessive mineral intake (specify): sodium As related to related  fluid retention from congestive heart failure.  As evidenced by diet recall of frequent high sodium meals.  Nutritional Diagnosis:  [place]-2.1 Inpaired nutrition utilization As related to glucose metabolism.  As evidenced by Hgb A1c of 8.2%.    Intervention:  Nutrition counseling provided on My Plate, Carb counting and a Low Purine Diet due to GOUT.  Carb countingt, meal planning, goal ranges for BS and healthy weight loss tips.   Goals 1. Eat three meals per day 2. Cut out snacks. 3  Make appt with Dr. Dorris Fetch or an endocrinologist 4. Drink only water Follow THe Plate Method  Keep a food journal and BS Log and bring at next visit.  Teaching Method Utilized:  Visual Auditory Hands on  Handouts given during visit include:  Low Sodium MNT  1500 calorie Meal Plan  Low sodium flavoring tips  Barriers to learning/adherence to lifestyle change: none  Demonstrated degree of understanding via:  Teach Back   Monitoring/Evaluation:  Dietary intake, exercise, and body weight in 1 month(s). Recommend referral to an Endocrinologist for her diabetes management. Would recommend to consider a better long acting insulin of Toujeo or Tresiba once a day and adjust Novolog  Dose with meals and sliding scale coverage for better blood sugar control.  Currently 70% of her insulin is being given as long acting insulin and 30% short acting.  Would  recommend to have 50% of her insulin long acting; 155 units a day and then Novolog 50 units with meals plus sliding scale for better blood sugar control.

## 2017-11-29 DIAGNOSIS — I129 Hypertensive chronic kidney disease with stage 1 through stage 4 chronic kidney disease, or unspecified chronic kidney disease: Secondary | ICD-10-CM | POA: Diagnosis not present

## 2017-11-30 DIAGNOSIS — R600 Localized edema: Secondary | ICD-10-CM | POA: Diagnosis not present

## 2017-11-30 DIAGNOSIS — I129 Hypertensive chronic kidney disease with stage 1 through stage 4 chronic kidney disease, or unspecified chronic kidney disease: Secondary | ICD-10-CM | POA: Diagnosis not present

## 2017-11-30 DIAGNOSIS — Z1212 Encounter for screening for malignant neoplasm of rectum: Secondary | ICD-10-CM | POA: Diagnosis not present

## 2017-11-30 DIAGNOSIS — K921 Melena: Secondary | ICD-10-CM | POA: Diagnosis not present

## 2017-11-30 DIAGNOSIS — E668 Other obesity: Secondary | ICD-10-CM | POA: Diagnosis not present

## 2017-11-30 DIAGNOSIS — E1165 Type 2 diabetes mellitus with hyperglycemia: Secondary | ICD-10-CM | POA: Diagnosis not present

## 2017-11-30 DIAGNOSIS — Z6841 Body Mass Index (BMI) 40.0 and over, adult: Secondary | ICD-10-CM | POA: Diagnosis not present

## 2017-12-01 ENCOUNTER — Encounter: Payer: Self-pay | Admitting: Gastroenterology

## 2017-12-13 DIAGNOSIS — E109 Type 1 diabetes mellitus without complications: Secondary | ICD-10-CM | POA: Diagnosis not present

## 2017-12-13 DIAGNOSIS — Z794 Long term (current) use of insulin: Secondary | ICD-10-CM | POA: Diagnosis not present

## 2017-12-14 ENCOUNTER — Encounter: Payer: Medicare HMO | Attending: Internal Medicine | Admitting: Nutrition

## 2017-12-14 VITALS — Ht 69.0 in | Wt 271.0 lb

## 2017-12-14 DIAGNOSIS — N184 Chronic kidney disease, stage 4 (severe): Secondary | ICD-10-CM

## 2017-12-14 DIAGNOSIS — E1165 Type 2 diabetes mellitus with hyperglycemia: Secondary | ICD-10-CM

## 2017-12-14 DIAGNOSIS — E785 Hyperlipidemia, unspecified: Secondary | ICD-10-CM | POA: Diagnosis not present

## 2017-12-14 DIAGNOSIS — Z713 Dietary counseling and surveillance: Secondary | ICD-10-CM | POA: Insufficient documentation

## 2017-12-14 DIAGNOSIS — E119 Type 2 diabetes mellitus without complications: Secondary | ICD-10-CM | POA: Insufficient documentation

## 2017-12-14 DIAGNOSIS — Z6841 Body Mass Index (BMI) 40.0 and over, adult: Secondary | ICD-10-CM | POA: Insufficient documentation

## 2017-12-14 DIAGNOSIS — I10 Essential (primary) hypertension: Secondary | ICD-10-CM | POA: Insufficient documentation

## 2017-12-14 DIAGNOSIS — IMO0002 Reserved for concepts with insufficient information to code with codable children: Secondary | ICD-10-CM

## 2017-12-14 DIAGNOSIS — E118 Type 2 diabetes mellitus with unspecified complications: Secondary | ICD-10-CM

## 2017-12-14 NOTE — Progress Notes (Signed)
  Medical Nutrition Therapy:  Appt start time: 1500 end time:  1430 Assessment:  Primary concerns today: F/u. Taking Levermir 70 units twice a day and Humalog 20-25 units with meals. Has [facility] are still above target AVG 265 mg/dl. 65% of BS are above target. Eating three meals per day. Current diet is doing very well and within in the guideslines of carbs/vegetables per meal. Still sees Dr. Dagmar Hait at [facility]. Sees MD for CKD. BS still poorly controlled.  Would benefit from [place] for a more effective long acting insulin. She will look into if her insurance covers it. Would benefit from referral to an Endocrinologist as her BS are still poorly controlled and contributing to her CKD.Marland Kitchen       Preferred Learning Style:   No preference indicated   Learning Readiness:   Ready  MEDICATIONS: see list    DIETARY INTAKE:  24-hr recall:  Eating 2-3 meals per day.   Usual physical activity: walks or plays ball with grandchildren for 10 minutes 3 x day   Estimated energy needs: 1600 calories 180 g carbohydrates 120 g protein 44 g fat  Progress Towards Goal(s):  In progress.   Nutritional Diagnosis:  NI-5.10.2 Excessive mineral intake (specify): sodium As related to related fluid retention from congestive heart failure.  As evidenced by diet recall of frequent high sodium meals.  Nutritional Diagnosis:  [place]-2.1 Inpaired nutrition utilization As related to glucose metabolism.  As evidenced by Hgb A1c of 8.2%.    Intervention:  Nutrition counseling provided on My Plate, Carb counting and a Low Purine Diet due to GOUT.  Carb countingt, meal planning, goal ranges for BS and healthy weight loss tips.   Eat 3 meals per day  Cut out snacks Drink only water Walk or exercise 30 minutes a day Call insurance to see if it will cover Toujeo or [place] for better blood sugar control. Get BS less than 150 in am and Less than 180 before bed.  Teaching Method  Utilized:  Visual Auditory Hands on  Handouts given during visit include:  Low Sodium MNT  1500 calorie Meal Plan  Low sodium flavoring tips  Barriers to learning/adherence to lifestyle change: none  Demonstrated degree of understanding via:  Teach Back   Monitoring/Evaluation:  Dietary intake, exercise, and body weight in 1 month(s). Recommend referral to an Endocrinologist for her diabetes management. Would recommend to consider a better long acting insulin of Toujeo or Tresiba once a day and adjust Novolog  Dose with meals and sliding scale coverage for better blood sugar control.  Currently 70% of her insulin is being given as long acting insulin and 30% short acting.  Would recommend to have 50% of her insulin long acting; 155 units a day and then Novolog 50 units with meals plus sliding scale for better blood sugar control.

## 2017-12-20 DIAGNOSIS — R351 Nocturia: Secondary | ICD-10-CM | POA: Diagnosis not present

## 2017-12-20 DIAGNOSIS — N952 Postmenopausal atrophic vaginitis: Secondary | ICD-10-CM | POA: Diagnosis not present

## 2017-12-20 DIAGNOSIS — R350 Frequency of micturition: Secondary | ICD-10-CM | POA: Diagnosis not present

## 2017-12-21 DIAGNOSIS — R600 Localized edema: Secondary | ICD-10-CM | POA: Diagnosis not present

## 2017-12-21 DIAGNOSIS — R4182 Altered mental status, unspecified: Secondary | ICD-10-CM | POA: Diagnosis not present

## 2017-12-21 DIAGNOSIS — Z6841 Body Mass Index (BMI) 40.0 and over, adult: Secondary | ICD-10-CM | POA: Diagnosis not present

## 2017-12-21 DIAGNOSIS — E1165 Type 2 diabetes mellitus with hyperglycemia: Secondary | ICD-10-CM | POA: Diagnosis not present

## 2017-12-21 DIAGNOSIS — N390 Urinary tract infection, site not specified: Secondary | ICD-10-CM | POA: Diagnosis not present

## 2017-12-22 ENCOUNTER — Other Ambulatory Visit (HOSPITAL_COMMUNITY): Payer: Self-pay | Admitting: Family Medicine

## 2017-12-22 ENCOUNTER — Ambulatory Visit: Payer: Medicare HMO | Admitting: "Endocrinology

## 2017-12-22 DIAGNOSIS — R600 Localized edema: Secondary | ICD-10-CM

## 2017-12-23 ENCOUNTER — Encounter: Payer: Self-pay | Admitting: Nutrition

## 2017-12-23 ENCOUNTER — Ambulatory Visit (HOSPITAL_COMMUNITY): Payer: Medicare HMO | Attending: Cardiovascular Disease

## 2017-12-23 ENCOUNTER — Other Ambulatory Visit: Payer: Self-pay

## 2017-12-23 DIAGNOSIS — I4891 Unspecified atrial fibrillation: Secondary | ICD-10-CM | POA: Diagnosis not present

## 2017-12-23 DIAGNOSIS — Z6841 Body Mass Index (BMI) 40.0 and over, adult: Secondary | ICD-10-CM | POA: Insufficient documentation

## 2017-12-23 DIAGNOSIS — J45909 Unspecified asthma, uncomplicated: Secondary | ICD-10-CM | POA: Diagnosis not present

## 2017-12-23 DIAGNOSIS — E119 Type 2 diabetes mellitus without complications: Secondary | ICD-10-CM | POA: Insufficient documentation

## 2017-12-23 DIAGNOSIS — E785 Hyperlipidemia, unspecified: Secondary | ICD-10-CM | POA: Insufficient documentation

## 2017-12-23 DIAGNOSIS — I110 Hypertensive heart disease with heart failure: Secondary | ICD-10-CM | POA: Diagnosis not present

## 2017-12-23 DIAGNOSIS — R600 Localized edema: Secondary | ICD-10-CM | POA: Diagnosis not present

## 2017-12-23 DIAGNOSIS — Z8249 Family history of ischemic heart disease and other diseases of the circulatory system: Secondary | ICD-10-CM | POA: Insufficient documentation

## 2017-12-23 NOTE — Patient Instructions (Signed)
Eat 3 meals per day  Cut out snacks Drink only water Walk or exercise 30 minutes a day Call insurance to see if it will cover Toujeo or [place] for better blood sugar control. Get BS less than 150 in am and Less than 180 before bed.

## 2017-12-27 ENCOUNTER — Other Ambulatory Visit: Payer: Self-pay | Admitting: Neurology

## 2017-12-27 NOTE — Telephone Encounter (Signed)
Received request for clonazepam. Refill denied, patient to get refills from PCP.

## 2017-12-28 DIAGNOSIS — R600 Localized edema: Secondary | ICD-10-CM | POA: Diagnosis not present

## 2017-12-28 DIAGNOSIS — E876 Hypokalemia: Secondary | ICD-10-CM | POA: Diagnosis not present

## 2017-12-28 DIAGNOSIS — E1140 Type 2 diabetes mellitus with diabetic neuropathy, unspecified: Secondary | ICD-10-CM | POA: Diagnosis not present

## 2017-12-28 DIAGNOSIS — Z6841 Body Mass Index (BMI) 40.0 and over, adult: Secondary | ICD-10-CM | POA: Diagnosis not present

## 2017-12-28 DIAGNOSIS — M6281 Muscle weakness (generalized): Secondary | ICD-10-CM | POA: Diagnosis not present

## 2017-12-28 DIAGNOSIS — R4182 Altered mental status, unspecified: Secondary | ICD-10-CM | POA: Diagnosis not present

## 2018-01-03 DIAGNOSIS — Z23 Encounter for immunization: Secondary | ICD-10-CM | POA: Diagnosis not present

## 2018-01-03 DIAGNOSIS — E876 Hypokalemia: Secondary | ICD-10-CM | POA: Diagnosis not present

## 2018-01-04 ENCOUNTER — Other Ambulatory Visit: Payer: Self-pay | Admitting: Neurology

## 2018-01-04 DIAGNOSIS — R601 Generalized edema: Secondary | ICD-10-CM | POA: Diagnosis not present

## 2018-01-04 DIAGNOSIS — E1142 Type 2 diabetes mellitus with diabetic polyneuropathy: Secondary | ICD-10-CM | POA: Diagnosis not present

## 2018-01-04 DIAGNOSIS — M79672 Pain in left foot: Secondary | ICD-10-CM | POA: Diagnosis not present

## 2018-01-04 DIAGNOSIS — M14672 Charcot's joint, left ankle and foot: Secondary | ICD-10-CM | POA: Diagnosis not present

## 2018-01-05 DIAGNOSIS — E1165 Type 2 diabetes mellitus with hyperglycemia: Secondary | ICD-10-CM | POA: Diagnosis not present

## 2018-01-10 ENCOUNTER — Encounter: Payer: Self-pay | Admitting: Gastroenterology

## 2018-01-10 ENCOUNTER — Ambulatory Visit: Payer: Medicare HMO | Admitting: Gastroenterology

## 2018-01-10 VITALS — BP 134/84 | HR 97 | Ht 69.0 in | Wt 269.0 lb

## 2018-01-10 DIAGNOSIS — R194 Change in bowel habit: Secondary | ICD-10-CM | POA: Diagnosis not present

## 2018-01-10 DIAGNOSIS — K921 Melena: Secondary | ICD-10-CM | POA: Diagnosis not present

## 2018-01-10 DIAGNOSIS — Z7901 Long term (current) use of anticoagulants: Secondary | ICD-10-CM | POA: Diagnosis not present

## 2018-01-10 DIAGNOSIS — I110 Hypertensive heart disease with heart failure: Secondary | ICD-10-CM | POA: Diagnosis not present

## 2018-01-10 DIAGNOSIS — Z794 Long term (current) use of insulin: Secondary | ICD-10-CM | POA: Diagnosis not present

## 2018-01-10 DIAGNOSIS — Z6841 Body Mass Index (BMI) 40.0 and over, adult: Secondary | ICD-10-CM | POA: Diagnosis not present

## 2018-01-10 DIAGNOSIS — E1165 Type 2 diabetes mellitus with hyperglycemia: Secondary | ICD-10-CM | POA: Diagnosis not present

## 2018-01-10 DIAGNOSIS — I509 Heart failure, unspecified: Secondary | ICD-10-CM | POA: Diagnosis not present

## 2018-01-10 DIAGNOSIS — E039 Hypothyroidism, unspecified: Secondary | ICD-10-CM | POA: Diagnosis not present

## 2018-01-10 DIAGNOSIS — E1142 Type 2 diabetes mellitus with diabetic polyneuropathy: Secondary | ICD-10-CM | POA: Diagnosis not present

## 2018-01-10 DIAGNOSIS — R69 Illness, unspecified: Secondary | ICD-10-CM | POA: Diagnosis not present

## 2018-01-10 DIAGNOSIS — I4891 Unspecified atrial fibrillation: Secondary | ICD-10-CM | POA: Diagnosis not present

## 2018-01-10 NOTE — Progress Notes (Signed)
Referring Provider: Prince Solian, MD Primary Care Physician:  Prince Solian, MD   Reason for Consultation:  Bright red blood with bowel movements   IMPRESSION:  Bright red blood with bowel movements One episode of black stools Change in bowel habits Chronic constipation exacerbated by immobility Xarelto use for atrial fibrillation New diagnosis of Charcot foot Diverticulosis on colonoscopy Distant history of colon polyps - results not available to me or in Epic    - no polyps on colonoscopy in 2014  Will plan EGD and colonoscopy for evaluation of GI bleeding when it clear that she will be able to safely prep given her Charcot foot and difficulty with safe ambulation. Ideally, will plan to have the procedures performed after a Xarelto washout if approved by her prescribing physician.   Patient understands that there is a low but real risk of cardiovascular event such as heart attack, stroke, or embolism /  thrombosis while off blood thinner. She would prefer approval from her cardiologist prior to proceeding with endoscopy.   We discussed management options for her constipation including Miralax and the Move It Diet, particularly while she is less mobile than normal. Information provided in writing. All questions were answered to her satisfaction.    PLAN: - Ask Dr. Donita Brooks about safety for Xarelto washout prior to colonoscopy - Will schedule her colonoscopy as soon as she feels she is able to comply with the prep safely given her foot pain. She will call me with any additional overt bleeding before then.  - Miralax 17g to manage recent constipation exacerbated by recent immobility  I consented the patient at the bedside today discussing the risks, benefits, and alternatives to endoscopic evaluation. In particular, we discussed the risks that include, but are not limited to, reaction to medication, cardiopulmonary compromise, bleeding requiring blood transfusion, aspiration  resulting in pneumonia, perforation requiring surgery, and even death. We reviewed the risk of missed lesion including polyps or even cancer. The patient acknowledges these risks and asks that we proceed.   HPI: Kristine [name] is a 72 y.o. female Husband of 50 years accompanies her to this appointment. The history is obtained through the patient and review of her electronic health record.   She had one black stool six weeks ago that prompted the evaluation for the visit. No associated symptoms. No further black stools. She was concerned about GI bleeding. No other associated symptoms. No identified exacerbating or relieving features.   She has also been having bright red blood in the stool and on the toilet paper that she attributes to Xarelto. Severe constipation that she attributes to the switch to potassium chloride. She is concerned because she will see a ball of potassium choride tablets. No tearing, but she has had to strain despite using Colace, Apple, and Prune Juice.   Her father had a stroke after stopping anticoagulants for a procedure. She has appropriate concerns about stopping her Xarelto.  Prior endoscopy: Screening Colonoscopy 08/13/08 revealed left-sided diverticulosis but no polyps. She remembers a colonoscopy with polyps prior to that time  Referral labs show: Hemoccult negative. Labs 12/21/17: WBC 8.45, hgb 15.4, plateets 226, MCV 93.9, RDW 12.4.   Her podiatrist has diagnosed Charcot foot. She has a consultation with Dr. Doran Durand next week and at [facility] for further evaluation. Her left foot is in a boot and she is having difficulty getting around due to the pain.  Past Medical History:  Diagnosis Date  . Anxiety   . Asthma   .  Atrial fibrillation ([place])   . CHF (congestive heart failure) ([place])   . Chronic bronchitis ([place])   . GERD (gastroesophageal reflux disease)   . High cholesterol   . History of hiatal hernia   . HTN (hypertension)   . Mild aortic sclerosis  ([place])   . Morbid obesity ([place])   . Osteoporosis   . Persistent atrial fibrillation   . Scoliosis   . Spinal stenosis   . Type II diabetes mellitus ([place])   . Vitamin D deficiency     Past Surgical History:  Procedure Laterality Date  . BLADDER SUSPENSION  1980s  . CARDIAC CATHETERIZATION  11/16/09   SMOOTH AND NORMAL  . CARDIOVERSION N/A 01/07/2015   Procedure: CARDIOVERSION;  Surgeon: Skeet Latch, MD;  Location[facility] ENDOSCOPY;  Service: Cardiovascular;  Laterality: N/A;  . CARDIOVERSION N/A 03/11/2015   Procedure: CARDIOVERSION;  Surgeon: Skeet Latch, MD;  Location: [facility];  Service: Cardiovascular;  Laterality: N/A;  . CARPAL TUNNEL RELEASE Left 1970s  . CATARACT EXTRACTION W/ INTRAOCULAR LENS  IMPLANT, BILATERAL Bilateral ~ 2010  . COLONOSCOPY  08/14/08  . FINGER FRACTURE SURGERY Right 1970s   "ring finger"  . FRACTURE SURGERY    . LAPAROSCOPIC CHOLECYSTECTOMY  1980s  . TUBAL LIGATION    . VAGINAL HYSTERECTOMY  1980    Prior to Admission medications   Medication Sig Start Date End Date Taking? Authorizing Provider  ALPRAZolam (XANAX) 0.25 MG tablet Take 0.125-0.25 mg by mouth 2 (two) times daily as needed for anxiety.  03/26/14   [provider]  BD PEN NEEDLE NANO U/F 32G X 4 MM MISC Inject 1 each as directed See admin instructions. Use pen needles with insulin pens daily 12/24/14   [provider]  clindamycin (CLEOCIN) 300 MG capsule TAKE 2 CAPSULES BY MOUTH 1 HOUR PRIOR TO DENTAL APPOINTMENT 03/02/16   [provider]  clonazePAM (KLONOPIN) 0.5 MG tablet Take 1 tablet (0.5 mg total) by mouth at bedtime as needed for anxiety. Take 1-2 tablets at bedtime as needed for sleep. Please call [phone] to schedule an appt to continue refills. 10/12/17   Marcial Pacas, MD  colchicine 0.6 MG tablet Take 0.6 mg by mouth daily.    [provider]  esomeprazole (NEXIUM) 40 MG capsule Take 40 mg by mouth daily as needed (for acid reflux).      [provider]  estradiol (ESTRACE) 0.1 MG/GM vaginal cream Apply daily 12/18/16   [provider]  furosemide (LASIX) 40 MG tablet Take 2 tablets (80 mg total) by mouth 2 (two) times daily. 08/17/17   Nahser, Wonda Cheng, MD  HUMALOG KWIKPEN 200 UNIT/ML SOPN Inject 26 Units into the skin daily.  12/13/14   [provider]  insulin detemir (LEVEMIR) 100 UNIT/ML injection Inject 70 Units into the skin 2 (two) times daily.     [provider]  KLOR-CON 10 10 MEQ tablet Take 10 mEq by mouth 4 (four) times daily.  05/15/14   [provider]  levothyroxine (SYNTHROID, LEVOTHROID) 25 MCG tablet Take 1 tablet by mouth daily. 10/24/15   [provider]  Lidocaine 0.5 % GEL Apply 4 g topically 3 (three) times daily. 07/29/16   Marcial Pacas, MD  losartan (COZAAR) 100 MG tablet Take 1 tablet (100 mg total) by mouth daily. 04/30/17   Nahser, Wonda Cheng, MD  LYRICA 100 MG capsule TAKE 1 CAPSULE UP TO 3 TIMES DAILY 06/01/17   [provider]  metoprolol succinate (TOPROL-XL) 50  MG 24 hr tablet TAKE 1 AND 1/2 TABLET (75MG ) BY MOUTH TWICE A DAY 10/11/17   Nahser, Wonda Cheng, MD  ULORIC 80 MG TABS Take 1 tablet by mouth daily. 04/29/17   [provider]  Vitamin D, Ergocalciferol, (DRISDOL) 50000 UNITS CAPS Take 50,000 Units by mouth every 7 (seven) days. Takes on Thursday    [provider]  XARELTO 15 MG TABS tablet Take 15 mg by mouth daily. 06/27/16   [provider]    Current Outpatient Medications  Medication Sig Dispense Refill  . ALPRAZolam (XANAX) 0.25 MG tablet Take 0.125-0.25 mg by mouth 2 (two) times daily as needed for anxiety.   0  . BD PEN NEEDLE NANO U/F 32G X 4 MM MISC Inject 1 each as directed See admin instructions. Use pen needles with insulin pens daily  11  . clindamycin (CLEOCIN) 300 MG capsule TAKE 2 CAPSULES BY MOUTH 1 HOUR PRIOR TO DENTAL APPOINTMENT  0  . clonazePAM (KLONOPIN) 0.5 MG tablet Take 1 tablet (0.5 mg total)  by mouth at bedtime as needed for anxiety. Take 1-2 tablets at bedtime as needed for sleep. Please call [phone] to schedule an appt to continue refills. 60 tablet 1  . colchicine 0.6 MG tablet Take 0.6 mg by mouth daily.    Marland Kitchen esomeprazole (NEXIUM) 40 MG capsule Take 40 mg by mouth daily as needed (for acid reflux).     Marland Kitchen estradiol (ESTRACE) 0.1 MG/GM vaginal cream Apply daily    . furosemide (LASIX) 40 MG tablet Take 2 tablets (80 mg total) by mouth 2 (two) times daily. 120 tablet 11  . HUMALOG KWIKPEN 200 UNIT/ML SOPN Inject 26 Units into the skin daily.   6  . insulin detemir (LEVEMIR) 100 UNIT/ML injection Inject 70 Units into the skin 2 (two) times daily.     Marland Kitchen KLOR-CON 10 10 MEQ tablet Take 10 mEq by mouth 4 (four) times daily.   11  . levothyroxine (SYNTHROID, LEVOTHROID) 25 MCG tablet Take 1 tablet by mouth daily.    . Lidocaine 0.5 % GEL Apply 4 g topically 3 (three) times daily. 170 g 6  . losartan (COZAAR) 100 MG tablet Take 1 tablet (100 mg total) by mouth daily. 90 tablet 2  . LYRICA 100 MG capsule TAKE 1 CAPSULE UP TO 3 TIMES DAILY  3  . metoprolol succinate (TOPROL-XL) 50 MG 24 hr tablet TAKE 1 AND 1/2 TABLET (75MG ) BY MOUTH TWICE A DAY 270 tablet 3  . ULORIC 80 MG TABS Take 1 tablet by mouth daily.  3  . Vitamin D, Ergocalciferol, (DRISDOL) 50000 UNITS CAPS Take 50,000 Units by mouth every 7 (seven) days. Takes on Thursday    . XARELTO 15 MG TABS tablet Take 15 mg by mouth daily.     No current facility-administered medications for this visit.     Allergies as of 01/10/2018 - Review Complete 12/23/2017  Allergen Reaction Noted  . Albuterol Other (See Comments) 05/12/2010  . Epinephrine Other (See Comments) 11/16/2013  . Penicillins Other (See Comments) 07/25/2008  . Bee venom Other (See Comments) 05/27/2010  . Ivp dye [iodinated diagnostic agents] Other (See Comments) 05/05/2012    Family History  Problem Relation Age of Onset  . Stroke Father   . Hypertension Father     . Atrial fibrillation Father        HAD MURMUR  . Heart failure Mother   . Congestive Heart Failure Mother   . Hypertension Mother   .  Hypertension Brother   . Hypertension Sister   . Hypertension Son   . CAD Sister        EARLY  . CAD Brother        EARLY    Social History   Socioeconomic History  . Marital status: Married    Spouse name: Not on file  . Number of children: 4  . Years of education: 51  . Highest education level: Not on file  Occupational History  . Not on file  Social Needs  . Financial resource strain: Not on file  . Food insecurity:    Worry: Not on file    Inability: Not on file  . Transportation needs:    Medical: Not on file    Non-medical: Not on file  Tobacco Use  . Smoking status: Never Smoker  . Smokeless tobacco: Never Used  Substance and Sexual Activity  . Alcohol use: No  . Drug use: No  . Sexual activity: Yes  Lifestyle  . Physical activity:    Days per week: Not on file    Minutes per session: Not on file  . Stress: Not on file  Relationships  . Social connections:    Talks on phone: Not on file    Gets together: Not on file    Attends religious service: Not on file    Active member of club or organization: Not on file    Attends meetings of clubs or organizations: Not on file    Relationship status: Not on file  . Intimate partner violence:    Fear of current or ex partner: Not on file    Emotionally abused: Not on file    Physically abused: Not on file    Forced sexual activity: Not on file  Other Topics Concern  . Not on file  Social History Narrative   Pt lives in [place] with spouse.   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   Right-handed.   1 cup caffeine per day.    Review of Systems: 12 system ROS is negative except as noted above.   Physical Exam:   General:   Alert, well-nourished, pleasant and cooperative  in NAD. Sitting in a wheelchair. Head:  Normocephalic and atraumatic. Eyes:  Sclera clear, no icterus.   Conjunctiva pink. Mouth:  No deformity or lesions.   Neck:  Supple; no thyromegaly. Lungs:  Clear throughout to auscultation.   No wheezes.  Heart:  Regular rate and rhythm; no murmurs Abdomen:  Soft, central obesity, nontender, normal bowel sounds. No rebound or guarding. No hepatosplenomegaly Rectal:  Deferred  Msk:  Left food in a soft boot. Extremities:  No gross deformities or edema. Neurologic:  Alert and  oriented x4;  grossly nonfocal Skin:  No rash or bruise. Psych:  Alert and cooperative. Normal mood and affect.    Emilianna Barlowe L. Tarri Glenn Md, MPH [facility] 01/10/2018, 1:16 PM

## 2018-01-10 NOTE — Patient Instructions (Signed)
Try Miralax 17 g daily. May increase to twice daily as needed.  Move It Diet: Equal parts of applesauce, bran, and prune juice. Store in a sealed container in the [place]. Use 2 tablespoons nightly. Increase as needed. Sit on the potty after having your morning cup of coffee to try to have a bowel movement.

## 2018-01-12 DIAGNOSIS — M6281 Muscle weakness (generalized): Secondary | ICD-10-CM | POA: Diagnosis not present

## 2018-01-12 DIAGNOSIS — E876 Hypokalemia: Secondary | ICD-10-CM | POA: Diagnosis not present

## 2018-01-13 DIAGNOSIS — Z794 Long term (current) use of insulin: Secondary | ICD-10-CM | POA: Diagnosis not present

## 2018-01-13 DIAGNOSIS — E109 Type 1 diabetes mellitus without complications: Secondary | ICD-10-CM | POA: Diagnosis not present

## 2018-01-21 ENCOUNTER — Other Ambulatory Visit: Payer: Self-pay | Admitting: Neurology

## 2018-01-21 DIAGNOSIS — M6281 Muscle weakness (generalized): Secondary | ICD-10-CM | POA: Diagnosis not present

## 2018-01-21 DIAGNOSIS — E876 Hypokalemia: Secondary | ICD-10-CM | POA: Diagnosis not present

## 2018-01-26 DIAGNOSIS — E8770 Fluid overload, unspecified: Secondary | ICD-10-CM | POA: Diagnosis not present

## 2018-01-26 DIAGNOSIS — E876 Hypokalemia: Secondary | ICD-10-CM | POA: Diagnosis not present

## 2018-01-26 DIAGNOSIS — I129 Hypertensive chronic kidney disease with stage 1 through stage 4 chronic kidney disease, or unspecified chronic kidney disease: Secondary | ICD-10-CM | POA: Diagnosis not present

## 2018-01-26 DIAGNOSIS — N184 Chronic kidney disease, stage 4 (severe): Secondary | ICD-10-CM | POA: Diagnosis not present

## 2018-01-26 DIAGNOSIS — R21 Rash and other nonspecific skin eruption: Secondary | ICD-10-CM | POA: Diagnosis not present

## 2018-01-26 DIAGNOSIS — I480 Paroxysmal atrial fibrillation: Secondary | ICD-10-CM | POA: Diagnosis not present

## 2018-01-26 DIAGNOSIS — K921 Melena: Secondary | ICD-10-CM | POA: Diagnosis not present

## 2018-01-26 DIAGNOSIS — M1460 Charcot's joint, unspecified site: Secondary | ICD-10-CM | POA: Diagnosis not present

## 2018-01-26 DIAGNOSIS — E038 Other specified hypothyroidism: Secondary | ICD-10-CM | POA: Diagnosis not present

## 2018-01-26 DIAGNOSIS — E1165 Type 2 diabetes mellitus with hyperglycemia: Secondary | ICD-10-CM | POA: Diagnosis not present

## 2018-01-26 DIAGNOSIS — R0600 Dyspnea, unspecified: Secondary | ICD-10-CM | POA: Diagnosis not present

## 2018-01-31 ENCOUNTER — Telehealth: Payer: Self-pay | Admitting: Cardiovascular Disease

## 2018-01-31 DIAGNOSIS — M79672 Pain in left foot: Secondary | ICD-10-CM | POA: Diagnosis not present

## 2018-01-31 DIAGNOSIS — M79671 Pain in right foot: Secondary | ICD-10-CM | POA: Diagnosis not present

## 2018-01-31 DIAGNOSIS — E11610 Type 2 diabetes mellitus with diabetic neuropathic arthropathy: Secondary | ICD-10-CM | POA: Diagnosis not present

## 2018-01-31 NOTE — Telephone Encounter (Signed)
New Message   Pt states she has a procedure on 11/22 for charcot foot and they will be working on the bones in her foot. States the appt that was cancelled due to Nahser being out on 11/13 was suppose to address that and now she needs an appointment this week. Please call

## 2018-01-31 NOTE — Telephone Encounter (Signed)
Spoke with [place], [facility] who states disregard message because patient has been scheduled to see Dr. Acie Fredrickson tomorrow

## 2018-02-01 ENCOUNTER — Ambulatory Visit: Payer: Medicare HMO | Admitting: Cardiovascular Disease

## 2018-02-01 ENCOUNTER — Encounter: Payer: Self-pay | Admitting: Cardiovascular Disease

## 2018-02-01 VITALS — BP 108/64 | HR 92 | Ht 69.0 in | Wt 267.0 lb

## 2018-02-01 DIAGNOSIS — E1140 Type 2 diabetes mellitus with diabetic neuropathy, unspecified: Secondary | ICD-10-CM | POA: Diagnosis not present

## 2018-02-01 DIAGNOSIS — R2689 Other abnormalities of gait and mobility: Secondary | ICD-10-CM | POA: Diagnosis not present

## 2018-02-01 DIAGNOSIS — I4819 Other persistent atrial fibrillation: Secondary | ICD-10-CM

## 2018-02-01 DIAGNOSIS — I5032 Chronic diastolic (congestive) heart failure: Secondary | ICD-10-CM | POA: Diagnosis not present

## 2018-02-01 DIAGNOSIS — M1990 Unspecified osteoarthritis, unspecified site: Secondary | ICD-10-CM | POA: Diagnosis not present

## 2018-02-01 DIAGNOSIS — M6281 Muscle weakness (generalized): Secondary | ICD-10-CM | POA: Diagnosis not present

## 2018-02-01 DIAGNOSIS — R600 Localized edema: Secondary | ICD-10-CM | POA: Diagnosis not present

## 2018-02-01 NOTE — Progress Notes (Signed)
Cardiology Office Note   Date:  02/01/2018   ID:  AMBRIE CARTE, DOB [birth year], MRN [id number]  PCP:  Prince Solian, MD  Cardiologist:   Mertie Moores, MD   Chief Complaint  Patient presents with  . Atrial Fibrillation   1. Atrial fibrillation 2. Diabetes mellitus 3. Chronic diastolic CHF 4. Obesity.      Kristine [name] is a 72 year old female with a history of atrial fibrillation. She has done well since I last saw her. She's not had any episodes of chest pain or shortness breath. Her blood pressure readings have been normal.  She is tolerating the Xarelto fairly well. She has some occasional bleeding from the gums. She enjoys not having to check her INR levels.   She has occasional palpitations and heart racing. This typically can rest and these will resolve. She is able to wak about 10 minutes at a time.   Oct. 24, 2014:  June 23, 2013:  Kristine [name]'s BP is elevated. She had a recent echocardiogram that revealed a left ventricle systolic function that was at the lower limits of normal-50%. Study Conclusions  - Left ventricle: The cavity size was normal. Wall thickness was increased in a pattern of mild LVH. The estimated ejection fraction was 50%. Diffuse hypokinesis. Features are consistent with a pseudonormal left ventricular filling pattern, with concomitant abnormal relaxation and increased filling pressure (grade 2 diastolic dysfunction). - Aortic valve: There was no stenosis. - Mitral valve: Mildly calcified annulus. Mildly calcified leaflets . Trivial regurgitation. - Left atrium: The atrium was moderately dilated. - Right ventricle: The cavity size was normal. Systolic function was mildly reduced. - Right atrium: The atrium was moderately dilated. - Systemic veins: IVC measured 1.7 cm with < 50% respirophasic variation, suggesting RA pressure 8 mmHg. - Pericardium, extracardiac: A trivial pericardial effusion was identified posterior to the  heart  She was having some abdominal pain and   September 29, 2013:  Nicoya is doing well. Stressed about coming here.   Jan. 7, 2015:  [name] is a 72 yo with hx of Afib and diastolic dysfunction She converted to NSR in the hospital with flecainide. She was tried on [place] but her QT interval prolonged. Was changed to Flecainide and has maintained NSR.  She is exercising regularly - 30 minutes a day. Is on Lasix 80 mg a day and mtalazone 2. 5 once a week. Is still retaining fluid. Trying to avoid salt in food.    June 11, 2014:   Kristine [name] is a 72 y.o. female who presents for follow-up of her atrial fibrillation. She has had some issues with fluid retention and  has been on metalazone.   Sept. 19, 2016:  Doing well.  Has had some shortness of breath - improved with increasing lasix to 80 BID .  BP is still high this am.  BP at home has been normal .  June 18, 2015:  She has tried [place] but had prolonged QT - so it was stopped Tried on flecainide Was tried on amiodarone  Had a cardioversion in Dec.   Amiodarone dose was lowered to 200 mg a day and she is now back in atrial fib She is not as symptomatic today  She is very fatigued. Has not been sleeping well at all Has lots of arm fatigue in her arms with any activity   Nov. 2, 2017:    Still short of breath No CP  Active,  Has a pins and needle sensation  August 27, 2016:  Kristine [name] is seen back today for follow up of her atrial fib  Has failed Flecainide, tikosyn and now  Is on amiodarone.   We had her on a reduced dose of amiodarone due to symptoms of peripheral neuropathy. The dose was increased up to 200 mg daily by Dr. Rayann Heman.  She is tentatively scheduled for cardioversion. She does not want to consider an ablation .   February 25, 2017:  Kristine [name] is seen back today for her chronic atrial fibrillation. She has discontinued the amiodarone.  She seems to be tolerating the atrial fibrillation quite well.   We have adopted a strategy of anticoagulation and rate control.  She developed acute on chronic diastolic CHF last Thursday.   She has been on Lyrica  Went to the ER.   She took an extra Lasix on the way there and urinated all of the extra weight  She still takes metolazone very rarely - perhaps   Aug 06, 2017:  Had some weakness while coming in this afternoon.    Had fasted all day - until 2 PM  Feeling better.  Breathing is good   She is had lots of leg edema.  She has been seeing Dr. [name].  She now takes Lasix 80 mg 3 times a day.  She takes metolazone 2.5 mg 3 days a week.  February 01, 2018  Is having problem of her Charcot foot.  Here for pre op visit Echo in Oct. Showed EF of 45-50%.    She has grade 2 diastolic dysfunction by previous echo .  Moderate pulmonary HTN.    Past Medical History:  Diagnosis Date  . Anxiety   . Asthma   . Atrial fibrillation ([place])   . CHF (congestive heart failure) ([place])   . Chronic bronchitis ([place])   . GERD (gastroesophageal reflux disease)   . High cholesterol   . History of hiatal hernia   . HTN (hypertension)   . Mild aortic sclerosis ([place])   . Morbid obesity ([place])   . Osteoporosis   . Persistent atrial fibrillation   . Scoliosis   . Spinal stenosis   . Type II diabetes mellitus ([place])   . Vitamin D deficiency     Past Surgical History:  Procedure Laterality Date  . BLADDER SUSPENSION  1980s  . CARDIAC CATHETERIZATION  11/16/09   SMOOTH AND NORMAL  . CARDIOVERSION N/A 01/07/2015   Procedure: CARDIOVERSION;  Surgeon: Skeet Latch, MD;  [facility] ENDOSCOPY;  Service: Cardiovascular;  Laterality: N/A;  . CARDIOVERSION N/A 03/11/2015   Procedure: CARDIOVERSION;  Surgeon: Skeet Latch, MD;  Location: [place];  Service: Cardiovascular;  Laterality: N/A;  . CARPAL TUNNEL RELEASE Left 1970s  . CATARACT EXTRACTION W/ INTRAOCULAR LENS  IMPLANT, BILATERAL Bilateral ~ 2010  . COLONOSCOPY  08/14/08  . FINGER FRACTURE  SURGERY Right 1970s   "ring finger"  . FRACTURE SURGERY    . LAPAROSCOPIC CHOLECYSTECTOMY  1980s  . TUBAL LIGATION    . VAGINAL HYSTERECTOMY  1980     Current Outpatient Medications  Medication Sig Dispense Refill  . ALPRAZolam (XANAX) 0.25 MG tablet Take 0.125-0.25 mg by mouth 2 (two) times daily as needed for anxiety.   0  . amLODipine (NORVASC) 5 MG tablet Take 5 mg by mouth daily.    . BD PEN NEEDLE NANO U/F 32G X 4 MM MISC Inject 1 each as directed See admin instructions. Use pen needles with insulin pens daily  11  .  clindamycin (CLEOCIN) 300 MG capsule TAKE 2 CAPSULES BY MOUTH 1 HOUR PRIOR TO DENTAL APPOINTMENT  0  . clonazePAM (KLONOPIN) 0.5 MG tablet Take 0.5 mg by mouth at bedtime as needed for anxiety.    Marland Kitchen esomeprazole (NEXIUM) 40 MG capsule Take 40 mg by mouth daily as needed (for acid reflux).     Marland Kitchen estradiol (ESTRACE) 0.1 MG/GM vaginal cream Apply daily    . furosemide (LASIX) 80 MG tablet Take 80 mg by mouth 3 (three) times daily.    . insulin aspart (NOVOLOG FLEXPEN) 100 UNIT/ML FlexPen INJECT 30 UNITS WITH EACH MEAL PER SLIDING SCALE. E11.65    . insulin detemir (LEVEMIR) 100 UNIT/ML injection Inject 70 Units into the skin 2 (two) times daily.     Marland Kitchen KLOR-CON 10 10 MEQ tablet Take 60 mEq by mouth 2 (two) times daily.   11  . levothyroxine (SYNTHROID, LEVOTHROID) 25 MCG tablet Take 1 tablet by mouth daily.    . Lidocaine 0.5 % GEL Apply 4 g topically 3 (three) times daily. 170 g 6  . LYRICA 100 MG capsule TAKE 1 CAPSULE UP TO 3 TIMES DAILY  3  . metoprolol succinate (TOPROL-XL) 50 MG 24 hr tablet TAKE 1 AND 1/2 TABLET (75MG ) BY MOUTH TWICE A DAY 270 tablet 3  . ULORIC 80 MG TABS Take 1 tablet by mouth daily.  3  . Vitamin D, Ergocalciferol, (DRISDOL) 50000 UNITS CAPS Take 50,000 Units by mouth every 7 (seven) days. Takes on Thursday    . XARELTO 15 MG TABS tablet Take 15 mg by mouth daily.     No current facility-administered medications for this visit.     Allergies:    Albuterol; Epinephrine; Penicillins; Torsemide; Bee venom; and Ivp dye [iodinated diagnostic agents]    Social History:  The patient  reports that she has never smoked. She has never used smokeless tobacco. She reports that she does not drink alcohol or use drugs.   Family History:  The patient's family history includes Atrial fibrillation in her father; CAD in her brother and sister; Congestive Heart Failure in her mother; Heart failure in her mother; Hypertension in her brother, father, mother, sister, and son; Stroke in her father.    ROS:   Noted in current history, otherwise review of systems is negative.  Physical Exam: Blood pressure 108/64, pulse 92, height 5\' 9"  (1.753 m), weight 267 lb (121.1 kg), SpO2 95 %.  GEN:  Elderly , morbidly obese  HEENT: Normal NECK: No JVD; No carotid bruits LYMPHATICS: No lymphadenopathy CARDIAC: Irreg. Irreg.  RESPIRATORY:  Clear to auscultation without rales, wheezing or rhonchi  ABDOMEN: Soft, non-tender, non-distended MUSCULOSKELETAL:  No edema; No deformity  SKIN: Warm and dry NEUROLOGIC:  Alert and oriented x 3   EKG:      Recent Labs: 02/18/2017: B Natriuretic Peptide 300.8 08/06/2017: ALT 37 08/21/2017: BUN 56; Creatinine, Ser 2.25; Hemoglobin 13.7; Platelets 185; Potassium 3.5; Sodium 139    Lipid Panel    Component Value Date/Time   CHOL 180 08/06/2017 1354   TRIG 273 (H) 08/06/2017 1354   HDL 34 (L) 08/06/2017 1354   CHOLHDL 5.3 (H) 08/06/2017 1354   LDLCALC 91 08/06/2017 1354      Wt Readings from Last 3 Encounters:  02/01/18 267 lb (121.1 kg)  01/10/18 269 lb (122 kg)  12/14/17 271 lb (122.9 kg)      Other studies Reviewed: Additional studies/ records that were reviewed today include: . Review of the above  records demonstrates:    ASSESSMENT AND PLAN:  1. Atrial fibrillation -     Stable        2.  . Chronic diastolic CHF  :    We tried her on torsemide at some point but it sounds like she had a true  allergic reaction to torsemide.  She seems to be doing better on furosemide.  Continue furosemide.  3. Obesity.       4.  CKD   -   5.  Charcot foot: She is scheduled to have surgery in the next Friday.  She will be at relatively low risk for her upcoming surgery from a cardiac standpoint.  She may hold the Xarelto for 2 days prior to the surgery.  She is to restart the Xarelto as soon is it is safe from surgical standpoint.  He was previously recommended that she stop the Xarelto 5 days prior to surgery and then use Lovenox bridging.  Not had a stroke.  She does not have a mechanical valve.  She has atrial fibrillation.  We routinely stop Xarelto for 2 days prior to general surgery without bridging.  This method has been used quite safely for many years.     Current medicines are reviewed at length with the patient today.  The patient does not have concerns regarding medicines.  The following changes have been made:  no change  Labs/ tests ordered today include:   No orders of the defined types were placed in this encounter.   Disposition:   FU with me in 6 months      Signed, Mertie Moores, MD  02/01/2018 2:59 PM    [address], [place], [place]  [address] Phone: [phone]; Fax: [phone]

## 2018-02-01 NOTE — Patient Instructions (Signed)

## 2018-02-02 ENCOUNTER — Ambulatory Visit: Payer: Medicare HMO | Admitting: Cardiovascular Disease

## 2018-02-03 ENCOUNTER — Ambulatory Visit: Payer: Medicare HMO | Admitting: Nutrition

## 2018-02-11 DIAGNOSIS — I429 Cardiomyopathy, unspecified: Secondary | ICD-10-CM | POA: Insufficient documentation

## 2018-02-11 DIAGNOSIS — I130 Hypertensive heart and chronic kidney disease with heart failure and stage 1 through stage 4 chronic kidney disease, or unspecified chronic kidney disease: Secondary | ICD-10-CM | POA: Diagnosis not present

## 2018-02-11 DIAGNOSIS — R2689 Other abnormalities of gait and mobility: Secondary | ICD-10-CM | POA: Diagnosis not present

## 2018-02-11 DIAGNOSIS — I5032 Chronic diastolic (congestive) heart failure: Secondary | ICD-10-CM | POA: Diagnosis not present

## 2018-02-11 DIAGNOSIS — R54 Age-related physical debility: Secondary | ICD-10-CM | POA: Diagnosis not present

## 2018-02-11 DIAGNOSIS — R69 Illness, unspecified: Secondary | ICD-10-CM | POA: Diagnosis not present

## 2018-02-11 DIAGNOSIS — R600 Localized edema: Secondary | ICD-10-CM | POA: Diagnosis not present

## 2018-02-11 DIAGNOSIS — E1165 Type 2 diabetes mellitus with hyperglycemia: Secondary | ICD-10-CM | POA: Diagnosis not present

## 2018-02-11 DIAGNOSIS — Z6841 Body Mass Index (BMI) 40.0 and over, adult: Secondary | ICD-10-CM | POA: Diagnosis not present

## 2018-02-11 DIAGNOSIS — Z794 Long term (current) use of insulin: Secondary | ICD-10-CM | POA: Diagnosis not present

## 2018-02-11 DIAGNOSIS — I482 Chronic atrial fibrillation, unspecified: Secondary | ICD-10-CM | POA: Diagnosis not present

## 2018-02-11 DIAGNOSIS — E11610 Type 2 diabetes mellitus with diabetic neuropathic arthropathy: Secondary | ICD-10-CM | POA: Diagnosis not present

## 2018-02-11 DIAGNOSIS — N184 Chronic kidney disease, stage 4 (severe): Secondary | ICD-10-CM | POA: Diagnosis not present

## 2018-02-11 DIAGNOSIS — M6281 Muscle weakness (generalized): Secondary | ICD-10-CM | POA: Diagnosis not present

## 2018-02-11 DIAGNOSIS — E1140 Type 2 diabetes mellitus with diabetic neuropathy, unspecified: Secondary | ICD-10-CM | POA: Diagnosis not present

## 2018-02-11 DIAGNOSIS — I10 Essential (primary) hypertension: Secondary | ICD-10-CM | POA: Diagnosis not present

## 2018-02-11 DIAGNOSIS — G8918 Other acute postprocedural pain: Secondary | ICD-10-CM | POA: Diagnosis not present

## 2018-02-11 DIAGNOSIS — M1990 Unspecified osteoarthritis, unspecified site: Secondary | ICD-10-CM | POA: Diagnosis not present

## 2018-02-11 DIAGNOSIS — E109 Type 1 diabetes mellitus without complications: Secondary | ICD-10-CM | POA: Diagnosis not present

## 2018-02-12 DIAGNOSIS — I482 Chronic atrial fibrillation, unspecified: Secondary | ICD-10-CM | POA: Diagnosis not present

## 2018-02-12 DIAGNOSIS — G8918 Other acute postprocedural pain: Secondary | ICD-10-CM | POA: Diagnosis not present

## 2018-02-12 DIAGNOSIS — Z6841 Body Mass Index (BMI) 40.0 and over, adult: Secondary | ICD-10-CM | POA: Diagnosis not present

## 2018-02-12 DIAGNOSIS — E1165 Type 2 diabetes mellitus with hyperglycemia: Secondary | ICD-10-CM | POA: Diagnosis not present

## 2018-02-12 DIAGNOSIS — N184 Chronic kidney disease, stage 4 (severe): Secondary | ICD-10-CM | POA: Diagnosis not present

## 2018-02-12 DIAGNOSIS — I10 Essential (primary) hypertension: Secondary | ICD-10-CM | POA: Diagnosis not present

## 2018-02-12 DIAGNOSIS — E11610 Type 2 diabetes mellitus with diabetic neuropathic arthropathy: Secondary | ICD-10-CM | POA: Diagnosis not present

## 2018-02-13 DIAGNOSIS — G8918 Other acute postprocedural pain: Secondary | ICD-10-CM | POA: Diagnosis not present

## 2018-02-13 DIAGNOSIS — Z6841 Body Mass Index (BMI) 40.0 and over, adult: Secondary | ICD-10-CM | POA: Diagnosis not present

## 2018-02-13 DIAGNOSIS — I10 Essential (primary) hypertension: Secondary | ICD-10-CM | POA: Diagnosis not present

## 2018-02-13 DIAGNOSIS — I482 Chronic atrial fibrillation, unspecified: Secondary | ICD-10-CM | POA: Diagnosis not present

## 2018-02-13 DIAGNOSIS — E11610 Type 2 diabetes mellitus with diabetic neuropathic arthropathy: Secondary | ICD-10-CM | POA: Diagnosis not present

## 2018-02-13 DIAGNOSIS — E1165 Type 2 diabetes mellitus with hyperglycemia: Secondary | ICD-10-CM | POA: Diagnosis not present

## 2018-02-13 DIAGNOSIS — N184 Chronic kidney disease, stage 4 (severe): Secondary | ICD-10-CM | POA: Diagnosis not present

## 2018-02-14 DIAGNOSIS — E109 Type 1 diabetes mellitus without complications: Secondary | ICD-10-CM | POA: Diagnosis not present

## 2018-02-14 DIAGNOSIS — Z794 Long term (current) use of insulin: Secondary | ICD-10-CM | POA: Diagnosis not present

## 2018-02-14 DIAGNOSIS — I10 Essential (primary) hypertension: Secondary | ICD-10-CM | POA: Diagnosis not present

## 2018-02-14 DIAGNOSIS — E1165 Type 2 diabetes mellitus with hyperglycemia: Secondary | ICD-10-CM | POA: Diagnosis not present

## 2018-02-14 DIAGNOSIS — E11610 Type 2 diabetes mellitus with diabetic neuropathic arthropathy: Secondary | ICD-10-CM | POA: Diagnosis not present

## 2018-02-14 DIAGNOSIS — I482 Chronic atrial fibrillation, unspecified: Secondary | ICD-10-CM | POA: Diagnosis not present

## 2018-02-14 DIAGNOSIS — N184 Chronic kidney disease, stage 4 (severe): Secondary | ICD-10-CM | POA: Diagnosis not present

## 2018-02-14 DIAGNOSIS — Z6841 Body Mass Index (BMI) 40.0 and over, adult: Secondary | ICD-10-CM | POA: Diagnosis not present

## 2018-02-14 DIAGNOSIS — G8918 Other acute postprocedural pain: Secondary | ICD-10-CM | POA: Diagnosis not present

## 2018-02-15 DIAGNOSIS — M1990 Unspecified osteoarthritis, unspecified site: Secondary | ICD-10-CM | POA: Diagnosis not present

## 2018-02-15 DIAGNOSIS — I482 Chronic atrial fibrillation, unspecified: Secondary | ICD-10-CM | POA: Diagnosis not present

## 2018-02-15 DIAGNOSIS — M6281 Muscle weakness (generalized): Secondary | ICD-10-CM | POA: Diagnosis not present

## 2018-02-15 DIAGNOSIS — R2689 Other abnormalities of gait and mobility: Secondary | ICD-10-CM | POA: Diagnosis not present

## 2018-02-15 DIAGNOSIS — N184 Chronic kidney disease, stage 4 (severe): Secondary | ICD-10-CM | POA: Diagnosis not present

## 2018-02-15 DIAGNOSIS — Z6841 Body Mass Index (BMI) 40.0 and over, adult: Secondary | ICD-10-CM | POA: Diagnosis not present

## 2018-02-15 DIAGNOSIS — E1165 Type 2 diabetes mellitus with hyperglycemia: Secondary | ICD-10-CM | POA: Diagnosis not present

## 2018-02-15 DIAGNOSIS — E11610 Type 2 diabetes mellitus with diabetic neuropathic arthropathy: Secondary | ICD-10-CM | POA: Diagnosis not present

## 2018-02-15 DIAGNOSIS — E1140 Type 2 diabetes mellitus with diabetic neuropathy, unspecified: Secondary | ICD-10-CM | POA: Diagnosis not present

## 2018-02-15 DIAGNOSIS — R600 Localized edema: Secondary | ICD-10-CM | POA: Diagnosis not present

## 2018-02-15 DIAGNOSIS — I10 Essential (primary) hypertension: Secondary | ICD-10-CM | POA: Diagnosis not present

## 2018-02-15 DIAGNOSIS — R54 Age-related physical debility: Secondary | ICD-10-CM | POA: Diagnosis not present

## 2018-02-16 DIAGNOSIS — Z4789 Encounter for other orthopedic aftercare: Secondary | ICD-10-CM | POA: Diagnosis not present

## 2018-02-16 DIAGNOSIS — I5032 Chronic diastolic (congestive) heart failure: Secondary | ICD-10-CM | POA: Diagnosis not present

## 2018-02-16 DIAGNOSIS — I130 Hypertensive heart and chronic kidney disease with heart failure and stage 1 through stage 4 chronic kidney disease, or unspecified chronic kidney disease: Secondary | ICD-10-CM | POA: Diagnosis not present

## 2018-02-16 DIAGNOSIS — E1165 Type 2 diabetes mellitus with hyperglycemia: Secondary | ICD-10-CM | POA: Diagnosis not present

## 2018-02-16 DIAGNOSIS — E11610 Type 2 diabetes mellitus with diabetic neuropathic arthropathy: Secondary | ICD-10-CM | POA: Diagnosis not present

## 2018-02-16 DIAGNOSIS — E1122 Type 2 diabetes mellitus with diabetic chronic kidney disease: Secondary | ICD-10-CM | POA: Diagnosis not present

## 2018-02-16 DIAGNOSIS — I482 Chronic atrial fibrillation, unspecified: Secondary | ICD-10-CM | POA: Diagnosis not present

## 2018-02-16 DIAGNOSIS — Z6841 Body Mass Index (BMI) 40.0 and over, adult: Secondary | ICD-10-CM | POA: Diagnosis not present

## 2018-02-16 DIAGNOSIS — N184 Chronic kidney disease, stage 4 (severe): Secondary | ICD-10-CM | POA: Diagnosis not present

## 2018-02-16 DIAGNOSIS — I429 Cardiomyopathy, unspecified: Secondary | ICD-10-CM | POA: Diagnosis not present

## 2018-02-21 DIAGNOSIS — N184 Chronic kidney disease, stage 4 (severe): Secondary | ICD-10-CM | POA: Diagnosis not present

## 2018-02-21 DIAGNOSIS — Z6841 Body Mass Index (BMI) 40.0 and over, adult: Secondary | ICD-10-CM | POA: Diagnosis not present

## 2018-02-21 DIAGNOSIS — E1165 Type 2 diabetes mellitus with hyperglycemia: Secondary | ICD-10-CM | POA: Diagnosis not present

## 2018-02-21 DIAGNOSIS — E11610 Type 2 diabetes mellitus with diabetic neuropathic arthropathy: Secondary | ICD-10-CM | POA: Diagnosis not present

## 2018-02-21 DIAGNOSIS — Z4789 Encounter for other orthopedic aftercare: Secondary | ICD-10-CM | POA: Diagnosis not present

## 2018-02-21 DIAGNOSIS — I482 Chronic atrial fibrillation, unspecified: Secondary | ICD-10-CM | POA: Diagnosis not present

## 2018-02-21 DIAGNOSIS — I5032 Chronic diastolic (congestive) heart failure: Secondary | ICD-10-CM | POA: Diagnosis not present

## 2018-02-21 DIAGNOSIS — E1122 Type 2 diabetes mellitus with diabetic chronic kidney disease: Secondary | ICD-10-CM | POA: Diagnosis not present

## 2018-02-21 DIAGNOSIS — I130 Hypertensive heart and chronic kidney disease with heart failure and stage 1 through stage 4 chronic kidney disease, or unspecified chronic kidney disease: Secondary | ICD-10-CM | POA: Diagnosis not present

## 2018-02-23 DIAGNOSIS — E1165 Type 2 diabetes mellitus with hyperglycemia: Secondary | ICD-10-CM | POA: Diagnosis not present

## 2018-02-23 DIAGNOSIS — E11610 Type 2 diabetes mellitus with diabetic neuropathic arthropathy: Secondary | ICD-10-CM | POA: Diagnosis not present

## 2018-02-23 DIAGNOSIS — E1122 Type 2 diabetes mellitus with diabetic chronic kidney disease: Secondary | ICD-10-CM | POA: Diagnosis not present

## 2018-02-23 DIAGNOSIS — I482 Chronic atrial fibrillation, unspecified: Secondary | ICD-10-CM | POA: Diagnosis not present

## 2018-02-23 DIAGNOSIS — N184 Chronic kidney disease, stage 4 (severe): Secondary | ICD-10-CM | POA: Diagnosis not present

## 2018-02-23 DIAGNOSIS — I130 Hypertensive heart and chronic kidney disease with heart failure and stage 1 through stage 4 chronic kidney disease, or unspecified chronic kidney disease: Secondary | ICD-10-CM | POA: Diagnosis not present

## 2018-02-23 DIAGNOSIS — Z6841 Body Mass Index (BMI) 40.0 and over, adult: Secondary | ICD-10-CM | POA: Diagnosis not present

## 2018-02-23 DIAGNOSIS — I5032 Chronic diastolic (congestive) heart failure: Secondary | ICD-10-CM | POA: Diagnosis not present

## 2018-02-23 DIAGNOSIS — Z4789 Encounter for other orthopedic aftercare: Secondary | ICD-10-CM | POA: Diagnosis not present

## 2018-02-24 DIAGNOSIS — E1165 Type 2 diabetes mellitus with hyperglycemia: Secondary | ICD-10-CM | POA: Diagnosis not present

## 2018-02-24 DIAGNOSIS — I130 Hypertensive heart and chronic kidney disease with heart failure and stage 1 through stage 4 chronic kidney disease, or unspecified chronic kidney disease: Secondary | ICD-10-CM | POA: Diagnosis not present

## 2018-02-24 DIAGNOSIS — E11610 Type 2 diabetes mellitus with diabetic neuropathic arthropathy: Secondary | ICD-10-CM | POA: Diagnosis not present

## 2018-02-24 DIAGNOSIS — Z4789 Encounter for other orthopedic aftercare: Secondary | ICD-10-CM | POA: Diagnosis not present

## 2018-02-24 DIAGNOSIS — E1122 Type 2 diabetes mellitus with diabetic chronic kidney disease: Secondary | ICD-10-CM | POA: Diagnosis not present

## 2018-02-24 DIAGNOSIS — N184 Chronic kidney disease, stage 4 (severe): Secondary | ICD-10-CM | POA: Diagnosis not present

## 2018-02-24 DIAGNOSIS — I482 Chronic atrial fibrillation, unspecified: Secondary | ICD-10-CM | POA: Diagnosis not present

## 2018-02-24 DIAGNOSIS — Z6841 Body Mass Index (BMI) 40.0 and over, adult: Secondary | ICD-10-CM | POA: Diagnosis not present

## 2018-02-24 DIAGNOSIS — I5032 Chronic diastolic (congestive) heart failure: Secondary | ICD-10-CM | POA: Diagnosis not present

## 2018-02-25 DIAGNOSIS — E11610 Type 2 diabetes mellitus with diabetic neuropathic arthropathy: Secondary | ICD-10-CM | POA: Diagnosis not present

## 2018-02-25 DIAGNOSIS — N184 Chronic kidney disease, stage 4 (severe): Secondary | ICD-10-CM | POA: Diagnosis not present

## 2018-02-25 DIAGNOSIS — E1165 Type 2 diabetes mellitus with hyperglycemia: Secondary | ICD-10-CM | POA: Diagnosis not present

## 2018-02-25 DIAGNOSIS — Z6841 Body Mass Index (BMI) 40.0 and over, adult: Secondary | ICD-10-CM | POA: Diagnosis not present

## 2018-02-25 DIAGNOSIS — I482 Chronic atrial fibrillation, unspecified: Secondary | ICD-10-CM | POA: Diagnosis not present

## 2018-02-25 DIAGNOSIS — E1122 Type 2 diabetes mellitus with diabetic chronic kidney disease: Secondary | ICD-10-CM | POA: Diagnosis not present

## 2018-02-25 DIAGNOSIS — I5032 Chronic diastolic (congestive) heart failure: Secondary | ICD-10-CM | POA: Diagnosis not present

## 2018-02-25 DIAGNOSIS — I130 Hypertensive heart and chronic kidney disease with heart failure and stage 1 through stage 4 chronic kidney disease, or unspecified chronic kidney disease: Secondary | ICD-10-CM | POA: Diagnosis not present

## 2018-02-25 DIAGNOSIS — Z4789 Encounter for other orthopedic aftercare: Secondary | ICD-10-CM | POA: Diagnosis not present

## 2018-03-01 DIAGNOSIS — N184 Chronic kidney disease, stage 4 (severe): Secondary | ICD-10-CM | POA: Diagnosis not present

## 2018-03-01 DIAGNOSIS — I130 Hypertensive heart and chronic kidney disease with heart failure and stage 1 through stage 4 chronic kidney disease, or unspecified chronic kidney disease: Secondary | ICD-10-CM | POA: Diagnosis not present

## 2018-03-01 DIAGNOSIS — E1165 Type 2 diabetes mellitus with hyperglycemia: Secondary | ICD-10-CM | POA: Diagnosis not present

## 2018-03-01 DIAGNOSIS — E1122 Type 2 diabetes mellitus with diabetic chronic kidney disease: Secondary | ICD-10-CM | POA: Diagnosis not present

## 2018-03-01 DIAGNOSIS — I482 Chronic atrial fibrillation, unspecified: Secondary | ICD-10-CM | POA: Diagnosis not present

## 2018-03-01 DIAGNOSIS — I5032 Chronic diastolic (congestive) heart failure: Secondary | ICD-10-CM | POA: Diagnosis not present

## 2018-03-01 DIAGNOSIS — Z6841 Body Mass Index (BMI) 40.0 and over, adult: Secondary | ICD-10-CM | POA: Diagnosis not present

## 2018-03-01 DIAGNOSIS — Z4789 Encounter for other orthopedic aftercare: Secondary | ICD-10-CM | POA: Diagnosis not present

## 2018-03-01 DIAGNOSIS — E11610 Type 2 diabetes mellitus with diabetic neuropathic arthropathy: Secondary | ICD-10-CM | POA: Diagnosis not present

## 2018-03-03 DIAGNOSIS — R600 Localized edema: Secondary | ICD-10-CM | POA: Diagnosis not present

## 2018-03-03 DIAGNOSIS — M6281 Muscle weakness (generalized): Secondary | ICD-10-CM | POA: Diagnosis not present

## 2018-03-03 DIAGNOSIS — R2689 Other abnormalities of gait and mobility: Secondary | ICD-10-CM | POA: Diagnosis not present

## 2018-03-03 DIAGNOSIS — M1990 Unspecified osteoarthritis, unspecified site: Secondary | ICD-10-CM | POA: Diagnosis not present

## 2018-03-03 DIAGNOSIS — E1140 Type 2 diabetes mellitus with diabetic neuropathy, unspecified: Secondary | ICD-10-CM | POA: Diagnosis not present

## 2018-03-04 DIAGNOSIS — I130 Hypertensive heart and chronic kidney disease with heart failure and stage 1 through stage 4 chronic kidney disease, or unspecified chronic kidney disease: Secondary | ICD-10-CM | POA: Diagnosis not present

## 2018-03-04 DIAGNOSIS — Z4789 Encounter for other orthopedic aftercare: Secondary | ICD-10-CM | POA: Diagnosis not present

## 2018-03-04 DIAGNOSIS — E11610 Type 2 diabetes mellitus with diabetic neuropathic arthropathy: Secondary | ICD-10-CM | POA: Diagnosis not present

## 2018-03-04 DIAGNOSIS — N184 Chronic kidney disease, stage 4 (severe): Secondary | ICD-10-CM | POA: Diagnosis not present

## 2018-03-04 DIAGNOSIS — E1165 Type 2 diabetes mellitus with hyperglycemia: Secondary | ICD-10-CM | POA: Diagnosis not present

## 2018-03-04 DIAGNOSIS — Z6841 Body Mass Index (BMI) 40.0 and over, adult: Secondary | ICD-10-CM | POA: Diagnosis not present

## 2018-03-04 DIAGNOSIS — I482 Chronic atrial fibrillation, unspecified: Secondary | ICD-10-CM | POA: Diagnosis not present

## 2018-03-04 DIAGNOSIS — I5032 Chronic diastolic (congestive) heart failure: Secondary | ICD-10-CM | POA: Diagnosis not present

## 2018-03-04 DIAGNOSIS — E1122 Type 2 diabetes mellitus with diabetic chronic kidney disease: Secondary | ICD-10-CM | POA: Diagnosis not present

## 2018-03-07 DIAGNOSIS — E1122 Type 2 diabetes mellitus with diabetic chronic kidney disease: Secondary | ICD-10-CM | POA: Diagnosis not present

## 2018-03-07 DIAGNOSIS — E11610 Type 2 diabetes mellitus with diabetic neuropathic arthropathy: Secondary | ICD-10-CM | POA: Diagnosis not present

## 2018-03-07 DIAGNOSIS — Z6841 Body Mass Index (BMI) 40.0 and over, adult: Secondary | ICD-10-CM | POA: Diagnosis not present

## 2018-03-07 DIAGNOSIS — Z4789 Encounter for other orthopedic aftercare: Secondary | ICD-10-CM | POA: Diagnosis not present

## 2018-03-07 DIAGNOSIS — I482 Chronic atrial fibrillation, unspecified: Secondary | ICD-10-CM | POA: Diagnosis not present

## 2018-03-07 DIAGNOSIS — N184 Chronic kidney disease, stage 4 (severe): Secondary | ICD-10-CM | POA: Diagnosis not present

## 2018-03-07 DIAGNOSIS — I5032 Chronic diastolic (congestive) heart failure: Secondary | ICD-10-CM | POA: Diagnosis not present

## 2018-03-07 DIAGNOSIS — I130 Hypertensive heart and chronic kidney disease with heart failure and stage 1 through stage 4 chronic kidney disease, or unspecified chronic kidney disease: Secondary | ICD-10-CM | POA: Diagnosis not present

## 2018-03-07 DIAGNOSIS — E1165 Type 2 diabetes mellitus with hyperglycemia: Secondary | ICD-10-CM | POA: Diagnosis not present

## 2018-03-08 DIAGNOSIS — E1122 Type 2 diabetes mellitus with diabetic chronic kidney disease: Secondary | ICD-10-CM | POA: Diagnosis not present

## 2018-03-08 DIAGNOSIS — I5032 Chronic diastolic (congestive) heart failure: Secondary | ICD-10-CM | POA: Diagnosis not present

## 2018-03-08 DIAGNOSIS — E1165 Type 2 diabetes mellitus with hyperglycemia: Secondary | ICD-10-CM | POA: Diagnosis not present

## 2018-03-08 DIAGNOSIS — N184 Chronic kidney disease, stage 4 (severe): Secondary | ICD-10-CM | POA: Diagnosis not present

## 2018-03-08 DIAGNOSIS — I130 Hypertensive heart and chronic kidney disease with heart failure and stage 1 through stage 4 chronic kidney disease, or unspecified chronic kidney disease: Secondary | ICD-10-CM | POA: Diagnosis not present

## 2018-03-08 DIAGNOSIS — I482 Chronic atrial fibrillation, unspecified: Secondary | ICD-10-CM | POA: Diagnosis not present

## 2018-03-08 DIAGNOSIS — Z4789 Encounter for other orthopedic aftercare: Secondary | ICD-10-CM | POA: Diagnosis not present

## 2018-03-08 DIAGNOSIS — E11610 Type 2 diabetes mellitus with diabetic neuropathic arthropathy: Secondary | ICD-10-CM | POA: Diagnosis not present

## 2018-03-08 DIAGNOSIS — Z6841 Body Mass Index (BMI) 40.0 and over, adult: Secondary | ICD-10-CM | POA: Diagnosis not present

## 2018-03-09 DIAGNOSIS — Z4789 Encounter for other orthopedic aftercare: Secondary | ICD-10-CM | POA: Diagnosis not present

## 2018-03-09 DIAGNOSIS — E1165 Type 2 diabetes mellitus with hyperglycemia: Secondary | ICD-10-CM | POA: Diagnosis not present

## 2018-03-09 DIAGNOSIS — Z794 Long term (current) use of insulin: Secondary | ICD-10-CM | POA: Diagnosis not present

## 2018-03-09 DIAGNOSIS — N184 Chronic kidney disease, stage 4 (severe): Secondary | ICD-10-CM | POA: Diagnosis not present

## 2018-03-09 DIAGNOSIS — E1122 Type 2 diabetes mellitus with diabetic chronic kidney disease: Secondary | ICD-10-CM | POA: Diagnosis not present

## 2018-03-09 DIAGNOSIS — I130 Hypertensive heart and chronic kidney disease with heart failure and stage 1 through stage 4 chronic kidney disease, or unspecified chronic kidney disease: Secondary | ICD-10-CM | POA: Diagnosis not present

## 2018-03-09 DIAGNOSIS — E11610 Type 2 diabetes mellitus with diabetic neuropathic arthropathy: Secondary | ICD-10-CM | POA: Diagnosis not present

## 2018-03-09 DIAGNOSIS — R69 Illness, unspecified: Secondary | ICD-10-CM | POA: Diagnosis not present

## 2018-03-09 DIAGNOSIS — Z6841 Body Mass Index (BMI) 40.0 and over, adult: Secondary | ICD-10-CM | POA: Diagnosis not present

## 2018-03-09 DIAGNOSIS — I5032 Chronic diastolic (congestive) heart failure: Secondary | ICD-10-CM | POA: Diagnosis not present

## 2018-03-09 DIAGNOSIS — M14679 Charcot's joint, unspecified ankle and foot: Secondary | ICD-10-CM | POA: Diagnosis not present

## 2018-03-09 DIAGNOSIS — I482 Chronic atrial fibrillation, unspecified: Secondary | ICD-10-CM | POA: Diagnosis not present

## 2018-03-11 DIAGNOSIS — N184 Chronic kidney disease, stage 4 (severe): Secondary | ICD-10-CM | POA: Diagnosis not present

## 2018-03-11 DIAGNOSIS — E11610 Type 2 diabetes mellitus with diabetic neuropathic arthropathy: Secondary | ICD-10-CM | POA: Diagnosis not present

## 2018-03-11 DIAGNOSIS — Z6841 Body Mass Index (BMI) 40.0 and over, adult: Secondary | ICD-10-CM | POA: Diagnosis not present

## 2018-03-11 DIAGNOSIS — Z4789 Encounter for other orthopedic aftercare: Secondary | ICD-10-CM | POA: Diagnosis not present

## 2018-03-11 DIAGNOSIS — I130 Hypertensive heart and chronic kidney disease with heart failure and stage 1 through stage 4 chronic kidney disease, or unspecified chronic kidney disease: Secondary | ICD-10-CM | POA: Diagnosis not present

## 2018-03-11 DIAGNOSIS — I5032 Chronic diastolic (congestive) heart failure: Secondary | ICD-10-CM | POA: Diagnosis not present

## 2018-03-11 DIAGNOSIS — E1165 Type 2 diabetes mellitus with hyperglycemia: Secondary | ICD-10-CM | POA: Diagnosis not present

## 2018-03-11 DIAGNOSIS — I482 Chronic atrial fibrillation, unspecified: Secondary | ICD-10-CM | POA: Diagnosis not present

## 2018-03-11 DIAGNOSIS — E1122 Type 2 diabetes mellitus with diabetic chronic kidney disease: Secondary | ICD-10-CM | POA: Diagnosis not present

## 2018-03-15 DIAGNOSIS — Z6841 Body Mass Index (BMI) 40.0 and over, adult: Secondary | ICD-10-CM | POA: Diagnosis not present

## 2018-03-15 DIAGNOSIS — E11610 Type 2 diabetes mellitus with diabetic neuropathic arthropathy: Secondary | ICD-10-CM | POA: Diagnosis not present

## 2018-03-15 DIAGNOSIS — E1165 Type 2 diabetes mellitus with hyperglycemia: Secondary | ICD-10-CM | POA: Diagnosis not present

## 2018-03-15 DIAGNOSIS — I130 Hypertensive heart and chronic kidney disease with heart failure and stage 1 through stage 4 chronic kidney disease, or unspecified chronic kidney disease: Secondary | ICD-10-CM | POA: Diagnosis not present

## 2018-03-15 DIAGNOSIS — N184 Chronic kidney disease, stage 4 (severe): Secondary | ICD-10-CM | POA: Diagnosis not present

## 2018-03-15 DIAGNOSIS — Z4789 Encounter for other orthopedic aftercare: Secondary | ICD-10-CM | POA: Diagnosis not present

## 2018-03-15 DIAGNOSIS — E1122 Type 2 diabetes mellitus with diabetic chronic kidney disease: Secondary | ICD-10-CM | POA: Diagnosis not present

## 2018-03-15 DIAGNOSIS — I5032 Chronic diastolic (congestive) heart failure: Secondary | ICD-10-CM | POA: Diagnosis not present

## 2018-03-15 DIAGNOSIS — I482 Chronic atrial fibrillation, unspecified: Secondary | ICD-10-CM | POA: Diagnosis not present

## 2018-03-17 DIAGNOSIS — R2689 Other abnormalities of gait and mobility: Secondary | ICD-10-CM | POA: Diagnosis not present

## 2018-03-17 DIAGNOSIS — M1990 Unspecified osteoarthritis, unspecified site: Secondary | ICD-10-CM | POA: Diagnosis not present

## 2018-03-17 DIAGNOSIS — R54 Age-related physical debility: Secondary | ICD-10-CM | POA: Diagnosis not present

## 2018-03-17 DIAGNOSIS — R600 Localized edema: Secondary | ICD-10-CM | POA: Diagnosis not present

## 2018-03-17 DIAGNOSIS — M6281 Muscle weakness (generalized): Secondary | ICD-10-CM | POA: Diagnosis not present

## 2018-03-17 DIAGNOSIS — E1140 Type 2 diabetes mellitus with diabetic neuropathy, unspecified: Secondary | ICD-10-CM | POA: Diagnosis not present

## 2018-03-18 DIAGNOSIS — E109 Type 1 diabetes mellitus without complications: Secondary | ICD-10-CM | POA: Diagnosis not present

## 2018-03-18 DIAGNOSIS — Z794 Long term (current) use of insulin: Secondary | ICD-10-CM | POA: Diagnosis not present

## 2018-03-21 DIAGNOSIS — Z4789 Encounter for other orthopedic aftercare: Secondary | ICD-10-CM | POA: Diagnosis not present

## 2018-03-22 DIAGNOSIS — E1122 Type 2 diabetes mellitus with diabetic chronic kidney disease: Secondary | ICD-10-CM | POA: Diagnosis not present

## 2018-03-22 DIAGNOSIS — I482 Chronic atrial fibrillation, unspecified: Secondary | ICD-10-CM | POA: Diagnosis not present

## 2018-03-22 DIAGNOSIS — I5032 Chronic diastolic (congestive) heart failure: Secondary | ICD-10-CM | POA: Diagnosis not present

## 2018-03-22 DIAGNOSIS — I130 Hypertensive heart and chronic kidney disease with heart failure and stage 1 through stage 4 chronic kidney disease, or unspecified chronic kidney disease: Secondary | ICD-10-CM | POA: Diagnosis not present

## 2018-03-22 DIAGNOSIS — Z4789 Encounter for other orthopedic aftercare: Secondary | ICD-10-CM | POA: Diagnosis not present

## 2018-03-22 DIAGNOSIS — E11610 Type 2 diabetes mellitus with diabetic neuropathic arthropathy: Secondary | ICD-10-CM | POA: Diagnosis not present

## 2018-03-22 DIAGNOSIS — E1165 Type 2 diabetes mellitus with hyperglycemia: Secondary | ICD-10-CM | POA: Diagnosis not present

## 2018-03-22 DIAGNOSIS — Z6841 Body Mass Index (BMI) 40.0 and over, adult: Secondary | ICD-10-CM | POA: Diagnosis not present

## 2018-03-22 DIAGNOSIS — N184 Chronic kidney disease, stage 4 (severe): Secondary | ICD-10-CM | POA: Diagnosis not present

## 2018-03-25 DIAGNOSIS — Z4789 Encounter for other orthopedic aftercare: Secondary | ICD-10-CM | POA: Diagnosis not present

## 2018-03-25 DIAGNOSIS — I482 Chronic atrial fibrillation, unspecified: Secondary | ICD-10-CM | POA: Diagnosis not present

## 2018-03-25 DIAGNOSIS — I5032 Chronic diastolic (congestive) heart failure: Secondary | ICD-10-CM | POA: Diagnosis not present

## 2018-03-25 DIAGNOSIS — E1122 Type 2 diabetes mellitus with diabetic chronic kidney disease: Secondary | ICD-10-CM | POA: Diagnosis not present

## 2018-03-25 DIAGNOSIS — N184 Chronic kidney disease, stage 4 (severe): Secondary | ICD-10-CM | POA: Diagnosis not present

## 2018-03-25 DIAGNOSIS — E1165 Type 2 diabetes mellitus with hyperglycemia: Secondary | ICD-10-CM | POA: Diagnosis not present

## 2018-03-25 DIAGNOSIS — I130 Hypertensive heart and chronic kidney disease with heart failure and stage 1 through stage 4 chronic kidney disease, or unspecified chronic kidney disease: Secondary | ICD-10-CM | POA: Diagnosis not present

## 2018-03-25 DIAGNOSIS — E11610 Type 2 diabetes mellitus with diabetic neuropathic arthropathy: Secondary | ICD-10-CM | POA: Diagnosis not present

## 2018-03-25 DIAGNOSIS — Z6841 Body Mass Index (BMI) 40.0 and over, adult: Secondary | ICD-10-CM | POA: Diagnosis not present

## 2018-03-28 DIAGNOSIS — N184 Chronic kidney disease, stage 4 (severe): Secondary | ICD-10-CM | POA: Diagnosis not present

## 2018-03-29 DIAGNOSIS — E1165 Type 2 diabetes mellitus with hyperglycemia: Secondary | ICD-10-CM | POA: Diagnosis not present

## 2018-03-29 DIAGNOSIS — N184 Chronic kidney disease, stage 4 (severe): Secondary | ICD-10-CM | POA: Diagnosis not present

## 2018-03-29 DIAGNOSIS — E11610 Type 2 diabetes mellitus with diabetic neuropathic arthropathy: Secondary | ICD-10-CM | POA: Diagnosis not present

## 2018-03-29 DIAGNOSIS — I130 Hypertensive heart and chronic kidney disease with heart failure and stage 1 through stage 4 chronic kidney disease, or unspecified chronic kidney disease: Secondary | ICD-10-CM | POA: Diagnosis not present

## 2018-03-29 DIAGNOSIS — E1122 Type 2 diabetes mellitus with diabetic chronic kidney disease: Secondary | ICD-10-CM | POA: Diagnosis not present

## 2018-03-29 DIAGNOSIS — I482 Chronic atrial fibrillation, unspecified: Secondary | ICD-10-CM | POA: Diagnosis not present

## 2018-03-29 DIAGNOSIS — Z6841 Body Mass Index (BMI) 40.0 and over, adult: Secondary | ICD-10-CM | POA: Diagnosis not present

## 2018-03-29 DIAGNOSIS — Z4789 Encounter for other orthopedic aftercare: Secondary | ICD-10-CM | POA: Diagnosis not present

## 2018-03-29 DIAGNOSIS — I5032 Chronic diastolic (congestive) heart failure: Secondary | ICD-10-CM | POA: Diagnosis not present

## 2018-03-31 DIAGNOSIS — E1122 Type 2 diabetes mellitus with diabetic chronic kidney disease: Secondary | ICD-10-CM | POA: Diagnosis not present

## 2018-03-31 DIAGNOSIS — E11610 Type 2 diabetes mellitus with diabetic neuropathic arthropathy: Secondary | ICD-10-CM | POA: Diagnosis not present

## 2018-03-31 DIAGNOSIS — Z4789 Encounter for other orthopedic aftercare: Secondary | ICD-10-CM | POA: Diagnosis not present

## 2018-03-31 DIAGNOSIS — I482 Chronic atrial fibrillation, unspecified: Secondary | ICD-10-CM | POA: Diagnosis not present

## 2018-03-31 DIAGNOSIS — I5032 Chronic diastolic (congestive) heart failure: Secondary | ICD-10-CM | POA: Diagnosis not present

## 2018-03-31 DIAGNOSIS — I130 Hypertensive heart and chronic kidney disease with heart failure and stage 1 through stage 4 chronic kidney disease, or unspecified chronic kidney disease: Secondary | ICD-10-CM | POA: Diagnosis not present

## 2018-03-31 DIAGNOSIS — E1165 Type 2 diabetes mellitus with hyperglycemia: Secondary | ICD-10-CM | POA: Diagnosis not present

## 2018-03-31 DIAGNOSIS — N184 Chronic kidney disease, stage 4 (severe): Secondary | ICD-10-CM | POA: Diagnosis not present

## 2018-03-31 DIAGNOSIS — Z6841 Body Mass Index (BMI) 40.0 and over, adult: Secondary | ICD-10-CM | POA: Diagnosis not present

## 2018-04-03 DIAGNOSIS — E1140 Type 2 diabetes mellitus with diabetic neuropathy, unspecified: Secondary | ICD-10-CM | POA: Diagnosis not present

## 2018-04-03 DIAGNOSIS — M1990 Unspecified osteoarthritis, unspecified site: Secondary | ICD-10-CM | POA: Diagnosis not present

## 2018-04-03 DIAGNOSIS — M6281 Muscle weakness (generalized): Secondary | ICD-10-CM | POA: Diagnosis not present

## 2018-04-03 DIAGNOSIS — R2689 Other abnormalities of gait and mobility: Secondary | ICD-10-CM | POA: Diagnosis not present

## 2018-04-03 DIAGNOSIS — R600 Localized edema: Secondary | ICD-10-CM | POA: Diagnosis not present

## 2018-04-05 DIAGNOSIS — E11610 Type 2 diabetes mellitus with diabetic neuropathic arthropathy: Secondary | ICD-10-CM | POA: Diagnosis not present

## 2018-04-05 DIAGNOSIS — R21 Rash and other nonspecific skin eruption: Secondary | ICD-10-CM | POA: Diagnosis not present

## 2018-04-05 DIAGNOSIS — I482 Chronic atrial fibrillation, unspecified: Secondary | ICD-10-CM | POA: Diagnosis not present

## 2018-04-05 DIAGNOSIS — Z4789 Encounter for other orthopedic aftercare: Secondary | ICD-10-CM | POA: Diagnosis not present

## 2018-04-05 DIAGNOSIS — E1165 Type 2 diabetes mellitus with hyperglycemia: Secondary | ICD-10-CM | POA: Diagnosis not present

## 2018-04-05 DIAGNOSIS — I5032 Chronic diastolic (congestive) heart failure: Secondary | ICD-10-CM | POA: Diagnosis not present

## 2018-04-05 DIAGNOSIS — N184 Chronic kidney disease, stage 4 (severe): Secondary | ICD-10-CM | POA: Diagnosis not present

## 2018-04-05 DIAGNOSIS — E1122 Type 2 diabetes mellitus with diabetic chronic kidney disease: Secondary | ICD-10-CM | POA: Diagnosis not present

## 2018-04-05 DIAGNOSIS — I130 Hypertensive heart and chronic kidney disease with heart failure and stage 1 through stage 4 chronic kidney disease, or unspecified chronic kidney disease: Secondary | ICD-10-CM | POA: Diagnosis not present

## 2018-04-05 DIAGNOSIS — E8770 Fluid overload, unspecified: Secondary | ICD-10-CM | POA: Diagnosis not present

## 2018-04-05 DIAGNOSIS — I129 Hypertensive chronic kidney disease with stage 1 through stage 4 chronic kidney disease, or unspecified chronic kidney disease: Secondary | ICD-10-CM | POA: Diagnosis not present

## 2018-04-05 DIAGNOSIS — Z6841 Body Mass Index (BMI) 40.0 and over, adult: Secondary | ICD-10-CM | POA: Diagnosis not present

## 2018-04-06 DIAGNOSIS — Z6841 Body Mass Index (BMI) 40.0 and over, adult: Secondary | ICD-10-CM | POA: Diagnosis not present

## 2018-04-06 DIAGNOSIS — E1165 Type 2 diabetes mellitus with hyperglycemia: Secondary | ICD-10-CM | POA: Diagnosis not present

## 2018-04-06 DIAGNOSIS — I130 Hypertensive heart and chronic kidney disease with heart failure and stage 1 through stage 4 chronic kidney disease, or unspecified chronic kidney disease: Secondary | ICD-10-CM | POA: Diagnosis not present

## 2018-04-06 DIAGNOSIS — E1122 Type 2 diabetes mellitus with diabetic chronic kidney disease: Secondary | ICD-10-CM | POA: Diagnosis not present

## 2018-04-06 DIAGNOSIS — I5032 Chronic diastolic (congestive) heart failure: Secondary | ICD-10-CM | POA: Diagnosis not present

## 2018-04-06 DIAGNOSIS — I482 Chronic atrial fibrillation, unspecified: Secondary | ICD-10-CM | POA: Diagnosis not present

## 2018-04-06 DIAGNOSIS — E11610 Type 2 diabetes mellitus with diabetic neuropathic arthropathy: Secondary | ICD-10-CM | POA: Diagnosis not present

## 2018-04-06 DIAGNOSIS — N184 Chronic kidney disease, stage 4 (severe): Secondary | ICD-10-CM | POA: Diagnosis not present

## 2018-04-06 DIAGNOSIS — Z4789 Encounter for other orthopedic aftercare: Secondary | ICD-10-CM | POA: Diagnosis not present

## 2018-04-07 DIAGNOSIS — E1122 Type 2 diabetes mellitus with diabetic chronic kidney disease: Secondary | ICD-10-CM | POA: Diagnosis not present

## 2018-04-07 DIAGNOSIS — I130 Hypertensive heart and chronic kidney disease with heart failure and stage 1 through stage 4 chronic kidney disease, or unspecified chronic kidney disease: Secondary | ICD-10-CM | POA: Diagnosis not present

## 2018-04-07 DIAGNOSIS — E1165 Type 2 diabetes mellitus with hyperglycemia: Secondary | ICD-10-CM | POA: Diagnosis not present

## 2018-04-07 DIAGNOSIS — E11610 Type 2 diabetes mellitus with diabetic neuropathic arthropathy: Secondary | ICD-10-CM | POA: Diagnosis not present

## 2018-04-07 DIAGNOSIS — Z6841 Body Mass Index (BMI) 40.0 and over, adult: Secondary | ICD-10-CM | POA: Diagnosis not present

## 2018-04-07 DIAGNOSIS — I5032 Chronic diastolic (congestive) heart failure: Secondary | ICD-10-CM | POA: Diagnosis not present

## 2018-04-07 DIAGNOSIS — I482 Chronic atrial fibrillation, unspecified: Secondary | ICD-10-CM | POA: Diagnosis not present

## 2018-04-07 DIAGNOSIS — Z4789 Encounter for other orthopedic aftercare: Secondary | ICD-10-CM | POA: Diagnosis not present

## 2018-04-07 DIAGNOSIS — N184 Chronic kidney disease, stage 4 (severe): Secondary | ICD-10-CM | POA: Diagnosis not present

## 2018-04-12 DIAGNOSIS — I5032 Chronic diastolic (congestive) heart failure: Secondary | ICD-10-CM | POA: Diagnosis not present

## 2018-04-12 DIAGNOSIS — I482 Chronic atrial fibrillation, unspecified: Secondary | ICD-10-CM | POA: Diagnosis not present

## 2018-04-12 DIAGNOSIS — E1122 Type 2 diabetes mellitus with diabetic chronic kidney disease: Secondary | ICD-10-CM | POA: Diagnosis not present

## 2018-04-12 DIAGNOSIS — Z4789 Encounter for other orthopedic aftercare: Secondary | ICD-10-CM | POA: Diagnosis not present

## 2018-04-12 DIAGNOSIS — Z6841 Body Mass Index (BMI) 40.0 and over, adult: Secondary | ICD-10-CM | POA: Diagnosis not present

## 2018-04-12 DIAGNOSIS — I130 Hypertensive heart and chronic kidney disease with heart failure and stage 1 through stage 4 chronic kidney disease, or unspecified chronic kidney disease: Secondary | ICD-10-CM | POA: Diagnosis not present

## 2018-04-12 DIAGNOSIS — N184 Chronic kidney disease, stage 4 (severe): Secondary | ICD-10-CM | POA: Diagnosis not present

## 2018-04-12 DIAGNOSIS — E1165 Type 2 diabetes mellitus with hyperglycemia: Secondary | ICD-10-CM | POA: Diagnosis not present

## 2018-04-12 DIAGNOSIS — E11610 Type 2 diabetes mellitus with diabetic neuropathic arthropathy: Secondary | ICD-10-CM | POA: Diagnosis not present

## 2018-04-14 DIAGNOSIS — E1165 Type 2 diabetes mellitus with hyperglycemia: Secondary | ICD-10-CM | POA: Diagnosis not present

## 2018-04-14 DIAGNOSIS — I482 Chronic atrial fibrillation, unspecified: Secondary | ICD-10-CM | POA: Diagnosis not present

## 2018-04-14 DIAGNOSIS — E11610 Type 2 diabetes mellitus with diabetic neuropathic arthropathy: Secondary | ICD-10-CM | POA: Diagnosis not present

## 2018-04-14 DIAGNOSIS — M1990 Unspecified osteoarthritis, unspecified site: Secondary | ICD-10-CM | POA: Diagnosis not present

## 2018-04-14 DIAGNOSIS — Z6841 Body Mass Index (BMI) 40.0 and over, adult: Secondary | ICD-10-CM | POA: Diagnosis not present

## 2018-04-14 DIAGNOSIS — I5032 Chronic diastolic (congestive) heart failure: Secondary | ICD-10-CM | POA: Diagnosis not present

## 2018-04-14 DIAGNOSIS — R2689 Other abnormalities of gait and mobility: Secondary | ICD-10-CM | POA: Diagnosis not present

## 2018-04-14 DIAGNOSIS — E1140 Type 2 diabetes mellitus with diabetic neuropathy, unspecified: Secondary | ICD-10-CM | POA: Diagnosis not present

## 2018-04-14 DIAGNOSIS — Z4789 Encounter for other orthopedic aftercare: Secondary | ICD-10-CM | POA: Diagnosis not present

## 2018-04-14 DIAGNOSIS — R600 Localized edema: Secondary | ICD-10-CM | POA: Diagnosis not present

## 2018-04-14 DIAGNOSIS — E1122 Type 2 diabetes mellitus with diabetic chronic kidney disease: Secondary | ICD-10-CM | POA: Diagnosis not present

## 2018-04-14 DIAGNOSIS — N184 Chronic kidney disease, stage 4 (severe): Secondary | ICD-10-CM | POA: Diagnosis not present

## 2018-04-14 DIAGNOSIS — I130 Hypertensive heart and chronic kidney disease with heart failure and stage 1 through stage 4 chronic kidney disease, or unspecified chronic kidney disease: Secondary | ICD-10-CM | POA: Diagnosis not present

## 2018-04-14 DIAGNOSIS — M6281 Muscle weakness (generalized): Secondary | ICD-10-CM | POA: Diagnosis not present

## 2018-04-14 DIAGNOSIS — R54 Age-related physical debility: Secondary | ICD-10-CM | POA: Diagnosis not present

## 2018-04-17 DIAGNOSIS — E11610 Type 2 diabetes mellitus with diabetic neuropathic arthropathy: Secondary | ICD-10-CM | POA: Diagnosis not present

## 2018-04-17 DIAGNOSIS — E1122 Type 2 diabetes mellitus with diabetic chronic kidney disease: Secondary | ICD-10-CM | POA: Diagnosis not present

## 2018-04-17 DIAGNOSIS — I5032 Chronic diastolic (congestive) heart failure: Secondary | ICD-10-CM | POA: Diagnosis not present

## 2018-04-17 DIAGNOSIS — N184 Chronic kidney disease, stage 4 (severe): Secondary | ICD-10-CM | POA: Diagnosis not present

## 2018-04-17 DIAGNOSIS — Z4789 Encounter for other orthopedic aftercare: Secondary | ICD-10-CM | POA: Diagnosis not present

## 2018-04-17 DIAGNOSIS — E1165 Type 2 diabetes mellitus with hyperglycemia: Secondary | ICD-10-CM | POA: Diagnosis not present

## 2018-04-17 DIAGNOSIS — Z6841 Body Mass Index (BMI) 40.0 and over, adult: Secondary | ICD-10-CM | POA: Diagnosis not present

## 2018-04-17 DIAGNOSIS — I130 Hypertensive heart and chronic kidney disease with heart failure and stage 1 through stage 4 chronic kidney disease, or unspecified chronic kidney disease: Secondary | ICD-10-CM | POA: Diagnosis not present

## 2018-04-17 DIAGNOSIS — I429 Cardiomyopathy, unspecified: Secondary | ICD-10-CM | POA: Diagnosis not present

## 2018-04-18 DIAGNOSIS — Z4789 Encounter for other orthopedic aftercare: Secondary | ICD-10-CM | POA: Diagnosis not present

## 2018-04-18 DIAGNOSIS — E11610 Type 2 diabetes mellitus with diabetic neuropathic arthropathy: Secondary | ICD-10-CM | POA: Diagnosis not present

## 2018-04-19 DIAGNOSIS — N184 Chronic kidney disease, stage 4 (severe): Secondary | ICD-10-CM | POA: Diagnosis not present

## 2018-04-19 DIAGNOSIS — Z6841 Body Mass Index (BMI) 40.0 and over, adult: Secondary | ICD-10-CM | POA: Diagnosis not present

## 2018-04-19 DIAGNOSIS — Z4789 Encounter for other orthopedic aftercare: Secondary | ICD-10-CM | POA: Diagnosis not present

## 2018-04-19 DIAGNOSIS — I5032 Chronic diastolic (congestive) heart failure: Secondary | ICD-10-CM | POA: Diagnosis not present

## 2018-04-19 DIAGNOSIS — E1165 Type 2 diabetes mellitus with hyperglycemia: Secondary | ICD-10-CM | POA: Diagnosis not present

## 2018-04-19 DIAGNOSIS — I429 Cardiomyopathy, unspecified: Secondary | ICD-10-CM | POA: Diagnosis not present

## 2018-04-19 DIAGNOSIS — E1122 Type 2 diabetes mellitus with diabetic chronic kidney disease: Secondary | ICD-10-CM | POA: Diagnosis not present

## 2018-04-19 DIAGNOSIS — I130 Hypertensive heart and chronic kidney disease with heart failure and stage 1 through stage 4 chronic kidney disease, or unspecified chronic kidney disease: Secondary | ICD-10-CM | POA: Diagnosis not present

## 2018-04-19 DIAGNOSIS — E11610 Type 2 diabetes mellitus with diabetic neuropathic arthropathy: Secondary | ICD-10-CM | POA: Diagnosis not present

## 2018-04-21 DIAGNOSIS — E1165 Type 2 diabetes mellitus with hyperglycemia: Secondary | ICD-10-CM | POA: Diagnosis not present

## 2018-04-21 DIAGNOSIS — Z4789 Encounter for other orthopedic aftercare: Secondary | ICD-10-CM | POA: Diagnosis not present

## 2018-04-21 DIAGNOSIS — I429 Cardiomyopathy, unspecified: Secondary | ICD-10-CM | POA: Diagnosis not present

## 2018-04-21 DIAGNOSIS — E11610 Type 2 diabetes mellitus with diabetic neuropathic arthropathy: Secondary | ICD-10-CM | POA: Diagnosis not present

## 2018-04-21 DIAGNOSIS — Z6841 Body Mass Index (BMI) 40.0 and over, adult: Secondary | ICD-10-CM | POA: Diagnosis not present

## 2018-04-21 DIAGNOSIS — N184 Chronic kidney disease, stage 4 (severe): Secondary | ICD-10-CM | POA: Diagnosis not present

## 2018-04-21 DIAGNOSIS — E1122 Type 2 diabetes mellitus with diabetic chronic kidney disease: Secondary | ICD-10-CM | POA: Diagnosis not present

## 2018-04-21 DIAGNOSIS — I5032 Chronic diastolic (congestive) heart failure: Secondary | ICD-10-CM | POA: Diagnosis not present

## 2018-04-21 DIAGNOSIS — I130 Hypertensive heart and chronic kidney disease with heart failure and stage 1 through stage 4 chronic kidney disease, or unspecified chronic kidney disease: Secondary | ICD-10-CM | POA: Diagnosis not present

## 2018-04-25 DIAGNOSIS — Z6841 Body Mass Index (BMI) 40.0 and over, adult: Secondary | ICD-10-CM | POA: Diagnosis not present

## 2018-04-25 DIAGNOSIS — I429 Cardiomyopathy, unspecified: Secondary | ICD-10-CM | POA: Diagnosis not present

## 2018-04-25 DIAGNOSIS — Z4789 Encounter for other orthopedic aftercare: Secondary | ICD-10-CM | POA: Diagnosis not present

## 2018-04-25 DIAGNOSIS — E11610 Type 2 diabetes mellitus with diabetic neuropathic arthropathy: Secondary | ICD-10-CM | POA: Diagnosis not present

## 2018-04-25 DIAGNOSIS — E1122 Type 2 diabetes mellitus with diabetic chronic kidney disease: Secondary | ICD-10-CM | POA: Diagnosis not present

## 2018-04-25 DIAGNOSIS — E1165 Type 2 diabetes mellitus with hyperglycemia: Secondary | ICD-10-CM | POA: Diagnosis not present

## 2018-04-25 DIAGNOSIS — N184 Chronic kidney disease, stage 4 (severe): Secondary | ICD-10-CM | POA: Diagnosis not present

## 2018-04-25 DIAGNOSIS — I5032 Chronic diastolic (congestive) heart failure: Secondary | ICD-10-CM | POA: Diagnosis not present

## 2018-04-25 DIAGNOSIS — I130 Hypertensive heart and chronic kidney disease with heart failure and stage 1 through stage 4 chronic kidney disease, or unspecified chronic kidney disease: Secondary | ICD-10-CM | POA: Diagnosis not present

## 2018-04-26 DIAGNOSIS — I130 Hypertensive heart and chronic kidney disease with heart failure and stage 1 through stage 4 chronic kidney disease, or unspecified chronic kidney disease: Secondary | ICD-10-CM | POA: Diagnosis not present

## 2018-04-26 DIAGNOSIS — E109 Type 1 diabetes mellitus without complications: Secondary | ICD-10-CM | POA: Diagnosis not present

## 2018-04-26 DIAGNOSIS — I429 Cardiomyopathy, unspecified: Secondary | ICD-10-CM | POA: Diagnosis not present

## 2018-04-26 DIAGNOSIS — Z794 Long term (current) use of insulin: Secondary | ICD-10-CM | POA: Diagnosis not present

## 2018-04-26 DIAGNOSIS — Z6841 Body Mass Index (BMI) 40.0 and over, adult: Secondary | ICD-10-CM | POA: Diagnosis not present

## 2018-04-26 DIAGNOSIS — Z4789 Encounter for other orthopedic aftercare: Secondary | ICD-10-CM | POA: Diagnosis not present

## 2018-04-26 DIAGNOSIS — E1122 Type 2 diabetes mellitus with diabetic chronic kidney disease: Secondary | ICD-10-CM | POA: Diagnosis not present

## 2018-04-26 DIAGNOSIS — N184 Chronic kidney disease, stage 4 (severe): Secondary | ICD-10-CM | POA: Diagnosis not present

## 2018-04-26 DIAGNOSIS — E11610 Type 2 diabetes mellitus with diabetic neuropathic arthropathy: Secondary | ICD-10-CM | POA: Diagnosis not present

## 2018-04-26 DIAGNOSIS — E1165 Type 2 diabetes mellitus with hyperglycemia: Secondary | ICD-10-CM | POA: Diagnosis not present

## 2018-04-26 DIAGNOSIS — I5032 Chronic diastolic (congestive) heart failure: Secondary | ICD-10-CM | POA: Diagnosis not present

## 2018-04-27 DIAGNOSIS — Z4789 Encounter for other orthopedic aftercare: Secondary | ICD-10-CM | POA: Diagnosis not present

## 2018-04-27 DIAGNOSIS — E1122 Type 2 diabetes mellitus with diabetic chronic kidney disease: Secondary | ICD-10-CM | POA: Diagnosis not present

## 2018-04-27 DIAGNOSIS — I130 Hypertensive heart and chronic kidney disease with heart failure and stage 1 through stage 4 chronic kidney disease, or unspecified chronic kidney disease: Secondary | ICD-10-CM | POA: Diagnosis not present

## 2018-04-27 DIAGNOSIS — E11610 Type 2 diabetes mellitus with diabetic neuropathic arthropathy: Secondary | ICD-10-CM | POA: Diagnosis not present

## 2018-04-27 DIAGNOSIS — I5032 Chronic diastolic (congestive) heart failure: Secondary | ICD-10-CM | POA: Diagnosis not present

## 2018-04-27 DIAGNOSIS — Z6841 Body Mass Index (BMI) 40.0 and over, adult: Secondary | ICD-10-CM | POA: Diagnosis not present

## 2018-04-27 DIAGNOSIS — N184 Chronic kidney disease, stage 4 (severe): Secondary | ICD-10-CM | POA: Diagnosis not present

## 2018-04-27 DIAGNOSIS — E1165 Type 2 diabetes mellitus with hyperglycemia: Secondary | ICD-10-CM | POA: Diagnosis not present

## 2018-04-27 DIAGNOSIS — I429 Cardiomyopathy, unspecified: Secondary | ICD-10-CM | POA: Diagnosis not present

## 2018-04-28 DIAGNOSIS — I130 Hypertensive heart and chronic kidney disease with heart failure and stage 1 through stage 4 chronic kidney disease, or unspecified chronic kidney disease: Secondary | ICD-10-CM | POA: Diagnosis not present

## 2018-04-28 DIAGNOSIS — E11610 Type 2 diabetes mellitus with diabetic neuropathic arthropathy: Secondary | ICD-10-CM | POA: Diagnosis not present

## 2018-04-28 DIAGNOSIS — I5032 Chronic diastolic (congestive) heart failure: Secondary | ICD-10-CM | POA: Diagnosis not present

## 2018-04-28 DIAGNOSIS — Z4789 Encounter for other orthopedic aftercare: Secondary | ICD-10-CM | POA: Diagnosis not present

## 2018-04-28 DIAGNOSIS — Z6841 Body Mass Index (BMI) 40.0 and over, adult: Secondary | ICD-10-CM | POA: Diagnosis not present

## 2018-04-28 DIAGNOSIS — E1122 Type 2 diabetes mellitus with diabetic chronic kidney disease: Secondary | ICD-10-CM | POA: Diagnosis not present

## 2018-04-28 DIAGNOSIS — I429 Cardiomyopathy, unspecified: Secondary | ICD-10-CM | POA: Diagnosis not present

## 2018-04-28 DIAGNOSIS — E1165 Type 2 diabetes mellitus with hyperglycemia: Secondary | ICD-10-CM | POA: Diagnosis not present

## 2018-04-28 DIAGNOSIS — N184 Chronic kidney disease, stage 4 (severe): Secondary | ICD-10-CM | POA: Diagnosis not present

## 2018-04-29 ENCOUNTER — Other Ambulatory Visit: Payer: Self-pay | Admitting: Internal Medicine

## 2018-04-29 DIAGNOSIS — Z1231 Encounter for screening mammogram for malignant neoplasm of breast: Secondary | ICD-10-CM

## 2018-05-03 DIAGNOSIS — N184 Chronic kidney disease, stage 4 (severe): Secondary | ICD-10-CM | POA: Diagnosis not present

## 2018-05-03 DIAGNOSIS — I5032 Chronic diastolic (congestive) heart failure: Secondary | ICD-10-CM | POA: Diagnosis not present

## 2018-05-03 DIAGNOSIS — E11610 Type 2 diabetes mellitus with diabetic neuropathic arthropathy: Secondary | ICD-10-CM | POA: Diagnosis not present

## 2018-05-03 DIAGNOSIS — E1165 Type 2 diabetes mellitus with hyperglycemia: Secondary | ICD-10-CM | POA: Diagnosis not present

## 2018-05-03 DIAGNOSIS — Z6841 Body Mass Index (BMI) 40.0 and over, adult: Secondary | ICD-10-CM | POA: Diagnosis not present

## 2018-05-03 DIAGNOSIS — E1122 Type 2 diabetes mellitus with diabetic chronic kidney disease: Secondary | ICD-10-CM | POA: Diagnosis not present

## 2018-05-03 DIAGNOSIS — I429 Cardiomyopathy, unspecified: Secondary | ICD-10-CM | POA: Diagnosis not present

## 2018-05-03 DIAGNOSIS — I130 Hypertensive heart and chronic kidney disease with heart failure and stage 1 through stage 4 chronic kidney disease, or unspecified chronic kidney disease: Secondary | ICD-10-CM | POA: Diagnosis not present

## 2018-05-03 DIAGNOSIS — Z4789 Encounter for other orthopedic aftercare: Secondary | ICD-10-CM | POA: Diagnosis not present

## 2018-05-04 DIAGNOSIS — M10071 Idiopathic gout, right ankle and foot: Secondary | ICD-10-CM | POA: Diagnosis not present

## 2018-05-04 DIAGNOSIS — Z6841 Body Mass Index (BMI) 40.0 and over, adult: Secondary | ICD-10-CM | POA: Diagnosis not present

## 2018-05-04 DIAGNOSIS — E7849 Other hyperlipidemia: Secondary | ICD-10-CM | POA: Diagnosis not present

## 2018-05-04 DIAGNOSIS — N184 Chronic kidney disease, stage 4 (severe): Secondary | ICD-10-CM | POA: Diagnosis not present

## 2018-05-04 DIAGNOSIS — R82998 Other abnormal findings in urine: Secondary | ICD-10-CM | POA: Diagnosis not present

## 2018-05-04 DIAGNOSIS — M1990 Unspecified osteoarthritis, unspecified site: Secondary | ICD-10-CM | POA: Diagnosis not present

## 2018-05-04 DIAGNOSIS — M6281 Muscle weakness (generalized): Secondary | ICD-10-CM | POA: Diagnosis not present

## 2018-05-04 DIAGNOSIS — E1140 Type 2 diabetes mellitus with diabetic neuropathy, unspecified: Secondary | ICD-10-CM | POA: Diagnosis not present

## 2018-05-04 DIAGNOSIS — E038 Other specified hypothyroidism: Secondary | ICD-10-CM | POA: Diagnosis not present

## 2018-05-04 DIAGNOSIS — Z4789 Encounter for other orthopedic aftercare: Secondary | ICD-10-CM | POA: Diagnosis not present

## 2018-05-04 DIAGNOSIS — I429 Cardiomyopathy, unspecified: Secondary | ICD-10-CM | POA: Diagnosis not present

## 2018-05-04 DIAGNOSIS — I130 Hypertensive heart and chronic kidney disease with heart failure and stage 1 through stage 4 chronic kidney disease, or unspecified chronic kidney disease: Secondary | ICD-10-CM | POA: Diagnosis not present

## 2018-05-04 DIAGNOSIS — E1122 Type 2 diabetes mellitus with diabetic chronic kidney disease: Secondary | ICD-10-CM | POA: Diagnosis not present

## 2018-05-04 DIAGNOSIS — M810 Age-related osteoporosis without current pathological fracture: Secondary | ICD-10-CM | POA: Diagnosis not present

## 2018-05-04 DIAGNOSIS — R2689 Other abnormalities of gait and mobility: Secondary | ICD-10-CM | POA: Diagnosis not present

## 2018-05-04 DIAGNOSIS — E11610 Type 2 diabetes mellitus with diabetic neuropathic arthropathy: Secondary | ICD-10-CM | POA: Diagnosis not present

## 2018-05-04 DIAGNOSIS — R600 Localized edema: Secondary | ICD-10-CM | POA: Diagnosis not present

## 2018-05-04 DIAGNOSIS — E1165 Type 2 diabetes mellitus with hyperglycemia: Secondary | ICD-10-CM | POA: Diagnosis not present

## 2018-05-04 DIAGNOSIS — I5032 Chronic diastolic (congestive) heart failure: Secondary | ICD-10-CM | POA: Diagnosis not present

## 2018-05-05 DIAGNOSIS — E1165 Type 2 diabetes mellitus with hyperglycemia: Secondary | ICD-10-CM | POA: Diagnosis not present

## 2018-05-05 DIAGNOSIS — E1122 Type 2 diabetes mellitus with diabetic chronic kidney disease: Secondary | ICD-10-CM | POA: Diagnosis not present

## 2018-05-05 DIAGNOSIS — Z4789 Encounter for other orthopedic aftercare: Secondary | ICD-10-CM | POA: Diagnosis not present

## 2018-05-05 DIAGNOSIS — I429 Cardiomyopathy, unspecified: Secondary | ICD-10-CM | POA: Diagnosis not present

## 2018-05-05 DIAGNOSIS — E11610 Type 2 diabetes mellitus with diabetic neuropathic arthropathy: Secondary | ICD-10-CM | POA: Diagnosis not present

## 2018-05-05 DIAGNOSIS — N184 Chronic kidney disease, stage 4 (severe): Secondary | ICD-10-CM | POA: Diagnosis not present

## 2018-05-05 DIAGNOSIS — I5032 Chronic diastolic (congestive) heart failure: Secondary | ICD-10-CM | POA: Diagnosis not present

## 2018-05-05 DIAGNOSIS — Z6841 Body Mass Index (BMI) 40.0 and over, adult: Secondary | ICD-10-CM | POA: Diagnosis not present

## 2018-05-05 DIAGNOSIS — I130 Hypertensive heart and chronic kidney disease with heart failure and stage 1 through stage 4 chronic kidney disease, or unspecified chronic kidney disease: Secondary | ICD-10-CM | POA: Diagnosis not present

## 2018-05-06 DIAGNOSIS — I429 Cardiomyopathy, unspecified: Secondary | ICD-10-CM | POA: Diagnosis not present

## 2018-05-06 DIAGNOSIS — E11610 Type 2 diabetes mellitus with diabetic neuropathic arthropathy: Secondary | ICD-10-CM | POA: Diagnosis not present

## 2018-05-06 DIAGNOSIS — E1165 Type 2 diabetes mellitus with hyperglycemia: Secondary | ICD-10-CM | POA: Diagnosis not present

## 2018-05-06 DIAGNOSIS — I130 Hypertensive heart and chronic kidney disease with heart failure and stage 1 through stage 4 chronic kidney disease, or unspecified chronic kidney disease: Secondary | ICD-10-CM | POA: Diagnosis not present

## 2018-05-06 DIAGNOSIS — Z6841 Body Mass Index (BMI) 40.0 and over, adult: Secondary | ICD-10-CM | POA: Diagnosis not present

## 2018-05-06 DIAGNOSIS — I5032 Chronic diastolic (congestive) heart failure: Secondary | ICD-10-CM | POA: Diagnosis not present

## 2018-05-06 DIAGNOSIS — N184 Chronic kidney disease, stage 4 (severe): Secondary | ICD-10-CM | POA: Diagnosis not present

## 2018-05-06 DIAGNOSIS — E1122 Type 2 diabetes mellitus with diabetic chronic kidney disease: Secondary | ICD-10-CM | POA: Diagnosis not present

## 2018-05-06 DIAGNOSIS — Z4789 Encounter for other orthopedic aftercare: Secondary | ICD-10-CM | POA: Diagnosis not present

## 2018-05-10 DIAGNOSIS — E1165 Type 2 diabetes mellitus with hyperglycemia: Secondary | ICD-10-CM | POA: Diagnosis not present

## 2018-05-10 DIAGNOSIS — E1122 Type 2 diabetes mellitus with diabetic chronic kidney disease: Secondary | ICD-10-CM | POA: Diagnosis not present

## 2018-05-10 DIAGNOSIS — N184 Chronic kidney disease, stage 4 (severe): Secondary | ICD-10-CM | POA: Diagnosis not present

## 2018-05-10 DIAGNOSIS — I429 Cardiomyopathy, unspecified: Secondary | ICD-10-CM | POA: Diagnosis not present

## 2018-05-10 DIAGNOSIS — I130 Hypertensive heart and chronic kidney disease with heart failure and stage 1 through stage 4 chronic kidney disease, or unspecified chronic kidney disease: Secondary | ICD-10-CM | POA: Diagnosis not present

## 2018-05-10 DIAGNOSIS — Z6841 Body Mass Index (BMI) 40.0 and over, adult: Secondary | ICD-10-CM | POA: Diagnosis not present

## 2018-05-10 DIAGNOSIS — I5032 Chronic diastolic (congestive) heart failure: Secondary | ICD-10-CM | POA: Diagnosis not present

## 2018-05-10 DIAGNOSIS — Z4789 Encounter for other orthopedic aftercare: Secondary | ICD-10-CM | POA: Diagnosis not present

## 2018-05-10 DIAGNOSIS — E11610 Type 2 diabetes mellitus with diabetic neuropathic arthropathy: Secondary | ICD-10-CM | POA: Diagnosis not present

## 2018-05-11 DIAGNOSIS — Z6841 Body Mass Index (BMI) 40.0 and over, adult: Secondary | ICD-10-CM | POA: Diagnosis not present

## 2018-05-11 DIAGNOSIS — I5032 Chronic diastolic (congestive) heart failure: Secondary | ICD-10-CM | POA: Diagnosis not present

## 2018-05-11 DIAGNOSIS — E11610 Type 2 diabetes mellitus with diabetic neuropathic arthropathy: Secondary | ICD-10-CM | POA: Diagnosis not present

## 2018-05-11 DIAGNOSIS — Z4789 Encounter for other orthopedic aftercare: Secondary | ICD-10-CM | POA: Diagnosis not present

## 2018-05-11 DIAGNOSIS — E1122 Type 2 diabetes mellitus with diabetic chronic kidney disease: Secondary | ICD-10-CM | POA: Diagnosis not present

## 2018-05-11 DIAGNOSIS — E1165 Type 2 diabetes mellitus with hyperglycemia: Secondary | ICD-10-CM | POA: Diagnosis not present

## 2018-05-11 DIAGNOSIS — N184 Chronic kidney disease, stage 4 (severe): Secondary | ICD-10-CM | POA: Diagnosis not present

## 2018-05-11 DIAGNOSIS — I130 Hypertensive heart and chronic kidney disease with heart failure and stage 1 through stage 4 chronic kidney disease, or unspecified chronic kidney disease: Secondary | ICD-10-CM | POA: Diagnosis not present

## 2018-05-11 DIAGNOSIS — I429 Cardiomyopathy, unspecified: Secondary | ICD-10-CM | POA: Diagnosis not present

## 2018-05-12 DIAGNOSIS — I429 Cardiomyopathy, unspecified: Secondary | ICD-10-CM | POA: Diagnosis not present

## 2018-05-12 DIAGNOSIS — E1140 Type 2 diabetes mellitus with diabetic neuropathy, unspecified: Secondary | ICD-10-CM | POA: Diagnosis not present

## 2018-05-12 DIAGNOSIS — E11610 Type 2 diabetes mellitus with diabetic neuropathic arthropathy: Secondary | ICD-10-CM | POA: Diagnosis not present

## 2018-05-12 DIAGNOSIS — M14679 Charcot's joint, unspecified ankle and foot: Secondary | ICD-10-CM | POA: Diagnosis not present

## 2018-05-12 DIAGNOSIS — I129 Hypertensive chronic kidney disease with stage 1 through stage 4 chronic kidney disease, or unspecified chronic kidney disease: Secondary | ICD-10-CM | POA: Diagnosis not present

## 2018-05-12 DIAGNOSIS — Z0000 Encounter for general adult medical examination without abnormal findings: Secondary | ICD-10-CM | POA: Diagnosis not present

## 2018-05-12 DIAGNOSIS — N184 Chronic kidney disease, stage 4 (severe): Secondary | ICD-10-CM | POA: Diagnosis not present

## 2018-05-12 DIAGNOSIS — E876 Hypokalemia: Secondary | ICD-10-CM | POA: Diagnosis not present

## 2018-05-12 DIAGNOSIS — E1159 Type 2 diabetes mellitus with other circulatory complications: Secondary | ICD-10-CM | POA: Diagnosis not present

## 2018-05-12 DIAGNOSIS — I509 Heart failure, unspecified: Secondary | ICD-10-CM | POA: Diagnosis not present

## 2018-05-12 DIAGNOSIS — I130 Hypertensive heart and chronic kidney disease with heart failure and stage 1 through stage 4 chronic kidney disease, or unspecified chronic kidney disease: Secondary | ICD-10-CM | POA: Diagnosis not present

## 2018-05-12 DIAGNOSIS — E1165 Type 2 diabetes mellitus with hyperglycemia: Secondary | ICD-10-CM | POA: Diagnosis not present

## 2018-05-12 DIAGNOSIS — Z6841 Body Mass Index (BMI) 40.0 and over, adult: Secondary | ICD-10-CM | POA: Diagnosis not present

## 2018-05-12 DIAGNOSIS — Z4789 Encounter for other orthopedic aftercare: Secondary | ICD-10-CM | POA: Diagnosis not present

## 2018-05-12 DIAGNOSIS — Z794 Long term (current) use of insulin: Secondary | ICD-10-CM | POA: Diagnosis not present

## 2018-05-12 DIAGNOSIS — E1122 Type 2 diabetes mellitus with diabetic chronic kidney disease: Secondary | ICD-10-CM | POA: Diagnosis not present

## 2018-05-12 DIAGNOSIS — I5032 Chronic diastolic (congestive) heart failure: Secondary | ICD-10-CM | POA: Diagnosis not present

## 2018-05-13 DIAGNOSIS — N184 Chronic kidney disease, stage 4 (severe): Secondary | ICD-10-CM | POA: Diagnosis not present

## 2018-05-13 DIAGNOSIS — E1122 Type 2 diabetes mellitus with diabetic chronic kidney disease: Secondary | ICD-10-CM | POA: Diagnosis not present

## 2018-05-13 DIAGNOSIS — Z6841 Body Mass Index (BMI) 40.0 and over, adult: Secondary | ICD-10-CM | POA: Diagnosis not present

## 2018-05-13 DIAGNOSIS — I5032 Chronic diastolic (congestive) heart failure: Secondary | ICD-10-CM | POA: Diagnosis not present

## 2018-05-13 DIAGNOSIS — Z4789 Encounter for other orthopedic aftercare: Secondary | ICD-10-CM | POA: Diagnosis not present

## 2018-05-13 DIAGNOSIS — I429 Cardiomyopathy, unspecified: Secondary | ICD-10-CM | POA: Diagnosis not present

## 2018-05-13 DIAGNOSIS — E11610 Type 2 diabetes mellitus with diabetic neuropathic arthropathy: Secondary | ICD-10-CM | POA: Diagnosis not present

## 2018-05-13 DIAGNOSIS — I130 Hypertensive heart and chronic kidney disease with heart failure and stage 1 through stage 4 chronic kidney disease, or unspecified chronic kidney disease: Secondary | ICD-10-CM | POA: Diagnosis not present

## 2018-05-13 DIAGNOSIS — E1165 Type 2 diabetes mellitus with hyperglycemia: Secondary | ICD-10-CM | POA: Diagnosis not present

## 2018-05-16 DIAGNOSIS — E11610 Type 2 diabetes mellitus with diabetic neuropathic arthropathy: Secondary | ICD-10-CM | POA: Diagnosis not present

## 2018-05-16 DIAGNOSIS — Z4789 Encounter for other orthopedic aftercare: Secondary | ICD-10-CM | POA: Diagnosis not present

## 2018-05-17 DIAGNOSIS — I429 Cardiomyopathy, unspecified: Secondary | ICD-10-CM | POA: Diagnosis not present

## 2018-05-17 DIAGNOSIS — I5032 Chronic diastolic (congestive) heart failure: Secondary | ICD-10-CM | POA: Diagnosis not present

## 2018-05-17 DIAGNOSIS — E1165 Type 2 diabetes mellitus with hyperglycemia: Secondary | ICD-10-CM | POA: Diagnosis not present

## 2018-05-17 DIAGNOSIS — I130 Hypertensive heart and chronic kidney disease with heart failure and stage 1 through stage 4 chronic kidney disease, or unspecified chronic kidney disease: Secondary | ICD-10-CM | POA: Diagnosis not present

## 2018-05-17 DIAGNOSIS — E1122 Type 2 diabetes mellitus with diabetic chronic kidney disease: Secondary | ICD-10-CM | POA: Diagnosis not present

## 2018-05-17 DIAGNOSIS — Z6841 Body Mass Index (BMI) 40.0 and over, adult: Secondary | ICD-10-CM | POA: Diagnosis not present

## 2018-05-17 DIAGNOSIS — Z4789 Encounter for other orthopedic aftercare: Secondary | ICD-10-CM | POA: Diagnosis not present

## 2018-05-17 DIAGNOSIS — N184 Chronic kidney disease, stage 4 (severe): Secondary | ICD-10-CM | POA: Diagnosis not present

## 2018-05-17 DIAGNOSIS — E11610 Type 2 diabetes mellitus with diabetic neuropathic arthropathy: Secondary | ICD-10-CM | POA: Diagnosis not present

## 2018-05-18 DIAGNOSIS — E1165 Type 2 diabetes mellitus with hyperglycemia: Secondary | ICD-10-CM | POA: Diagnosis not present

## 2018-05-18 DIAGNOSIS — E11610 Type 2 diabetes mellitus with diabetic neuropathic arthropathy: Secondary | ICD-10-CM | POA: Diagnosis not present

## 2018-05-18 DIAGNOSIS — E1122 Type 2 diabetes mellitus with diabetic chronic kidney disease: Secondary | ICD-10-CM | POA: Diagnosis not present

## 2018-05-18 DIAGNOSIS — Z4789 Encounter for other orthopedic aftercare: Secondary | ICD-10-CM | POA: Diagnosis not present

## 2018-05-18 DIAGNOSIS — N184 Chronic kidney disease, stage 4 (severe): Secondary | ICD-10-CM | POA: Diagnosis not present

## 2018-05-18 DIAGNOSIS — I429 Cardiomyopathy, unspecified: Secondary | ICD-10-CM | POA: Diagnosis not present

## 2018-05-18 DIAGNOSIS — I5032 Chronic diastolic (congestive) heart failure: Secondary | ICD-10-CM | POA: Diagnosis not present

## 2018-05-18 DIAGNOSIS — Z6841 Body Mass Index (BMI) 40.0 and over, adult: Secondary | ICD-10-CM | POA: Diagnosis not present

## 2018-05-18 DIAGNOSIS — I130 Hypertensive heart and chronic kidney disease with heart failure and stage 1 through stage 4 chronic kidney disease, or unspecified chronic kidney disease: Secondary | ICD-10-CM | POA: Diagnosis not present

## 2018-05-19 DIAGNOSIS — I429 Cardiomyopathy, unspecified: Secondary | ICD-10-CM | POA: Diagnosis not present

## 2018-05-19 DIAGNOSIS — Z6841 Body Mass Index (BMI) 40.0 and over, adult: Secondary | ICD-10-CM | POA: Diagnosis not present

## 2018-05-19 DIAGNOSIS — E11610 Type 2 diabetes mellitus with diabetic neuropathic arthropathy: Secondary | ICD-10-CM | POA: Diagnosis not present

## 2018-05-19 DIAGNOSIS — Z4789 Encounter for other orthopedic aftercare: Secondary | ICD-10-CM | POA: Diagnosis not present

## 2018-05-19 DIAGNOSIS — N184 Chronic kidney disease, stage 4 (severe): Secondary | ICD-10-CM | POA: Diagnosis not present

## 2018-05-19 DIAGNOSIS — E1165 Type 2 diabetes mellitus with hyperglycemia: Secondary | ICD-10-CM | POA: Diagnosis not present

## 2018-05-19 DIAGNOSIS — I5032 Chronic diastolic (congestive) heart failure: Secondary | ICD-10-CM | POA: Diagnosis not present

## 2018-05-19 DIAGNOSIS — I130 Hypertensive heart and chronic kidney disease with heart failure and stage 1 through stage 4 chronic kidney disease, or unspecified chronic kidney disease: Secondary | ICD-10-CM | POA: Diagnosis not present

## 2018-05-19 DIAGNOSIS — E1122 Type 2 diabetes mellitus with diabetic chronic kidney disease: Secondary | ICD-10-CM | POA: Diagnosis not present

## 2018-05-20 DIAGNOSIS — N184 Chronic kidney disease, stage 4 (severe): Secondary | ICD-10-CM | POA: Diagnosis not present

## 2018-05-20 DIAGNOSIS — E1165 Type 2 diabetes mellitus with hyperglycemia: Secondary | ICD-10-CM | POA: Diagnosis not present

## 2018-05-20 DIAGNOSIS — E1122 Type 2 diabetes mellitus with diabetic chronic kidney disease: Secondary | ICD-10-CM | POA: Diagnosis not present

## 2018-05-20 DIAGNOSIS — D631 Anemia in chronic kidney disease: Secondary | ICD-10-CM | POA: Diagnosis not present

## 2018-05-20 DIAGNOSIS — I5032 Chronic diastolic (congestive) heart failure: Secondary | ICD-10-CM | POA: Diagnosis not present

## 2018-05-20 DIAGNOSIS — Z6841 Body Mass Index (BMI) 40.0 and over, adult: Secondary | ICD-10-CM | POA: Diagnosis not present

## 2018-05-20 DIAGNOSIS — E11610 Type 2 diabetes mellitus with diabetic neuropathic arthropathy: Secondary | ICD-10-CM | POA: Diagnosis not present

## 2018-05-20 DIAGNOSIS — Z4789 Encounter for other orthopedic aftercare: Secondary | ICD-10-CM | POA: Diagnosis not present

## 2018-05-20 DIAGNOSIS — I130 Hypertensive heart and chronic kidney disease with heart failure and stage 1 through stage 4 chronic kidney disease, or unspecified chronic kidney disease: Secondary | ICD-10-CM | POA: Diagnosis not present

## 2018-05-20 DIAGNOSIS — I429 Cardiomyopathy, unspecified: Secondary | ICD-10-CM | POA: Diagnosis not present

## 2018-05-22 DIAGNOSIS — K219 Gastro-esophageal reflux disease without esophagitis: Secondary | ICD-10-CM | POA: Insufficient documentation

## 2018-05-22 DIAGNOSIS — M810 Age-related osteoporosis without current pathological fracture: Secondary | ICD-10-CM | POA: Insufficient documentation

## 2018-05-23 DIAGNOSIS — Z4789 Encounter for other orthopedic aftercare: Secondary | ICD-10-CM | POA: Diagnosis not present

## 2018-05-23 DIAGNOSIS — I130 Hypertensive heart and chronic kidney disease with heart failure and stage 1 through stage 4 chronic kidney disease, or unspecified chronic kidney disease: Secondary | ICD-10-CM | POA: Diagnosis not present

## 2018-05-23 DIAGNOSIS — E11610 Type 2 diabetes mellitus with diabetic neuropathic arthropathy: Secondary | ICD-10-CM | POA: Diagnosis not present

## 2018-05-23 DIAGNOSIS — I5032 Chronic diastolic (congestive) heart failure: Secondary | ICD-10-CM | POA: Diagnosis not present

## 2018-05-23 DIAGNOSIS — N184 Chronic kidney disease, stage 4 (severe): Secondary | ICD-10-CM | POA: Diagnosis not present

## 2018-05-23 DIAGNOSIS — E1165 Type 2 diabetes mellitus with hyperglycemia: Secondary | ICD-10-CM | POA: Diagnosis not present

## 2018-05-23 DIAGNOSIS — Z6841 Body Mass Index (BMI) 40.0 and over, adult: Secondary | ICD-10-CM | POA: Diagnosis not present

## 2018-05-23 DIAGNOSIS — E1122 Type 2 diabetes mellitus with diabetic chronic kidney disease: Secondary | ICD-10-CM | POA: Diagnosis not present

## 2018-05-23 DIAGNOSIS — I429 Cardiomyopathy, unspecified: Secondary | ICD-10-CM | POA: Diagnosis not present

## 2018-05-24 DIAGNOSIS — I129 Hypertensive chronic kidney disease with stage 1 through stage 4 chronic kidney disease, or unspecified chronic kidney disease: Secondary | ICD-10-CM | POA: Diagnosis not present

## 2018-05-24 DIAGNOSIS — E8770 Fluid overload, unspecified: Secondary | ICD-10-CM | POA: Diagnosis not present

## 2018-05-24 DIAGNOSIS — N184 Chronic kidney disease, stage 4 (severe): Secondary | ICD-10-CM | POA: Diagnosis not present

## 2018-05-25 DIAGNOSIS — I429 Cardiomyopathy, unspecified: Secondary | ICD-10-CM | POA: Diagnosis not present

## 2018-05-25 DIAGNOSIS — Z6841 Body Mass Index (BMI) 40.0 and over, adult: Secondary | ICD-10-CM | POA: Diagnosis not present

## 2018-05-25 DIAGNOSIS — Z4789 Encounter for other orthopedic aftercare: Secondary | ICD-10-CM | POA: Diagnosis not present

## 2018-05-25 DIAGNOSIS — I130 Hypertensive heart and chronic kidney disease with heart failure and stage 1 through stage 4 chronic kidney disease, or unspecified chronic kidney disease: Secondary | ICD-10-CM | POA: Diagnosis not present

## 2018-05-25 DIAGNOSIS — E11610 Type 2 diabetes mellitus with diabetic neuropathic arthropathy: Secondary | ICD-10-CM | POA: Diagnosis not present

## 2018-05-25 DIAGNOSIS — E1165 Type 2 diabetes mellitus with hyperglycemia: Secondary | ICD-10-CM | POA: Diagnosis not present

## 2018-05-25 DIAGNOSIS — E1122 Type 2 diabetes mellitus with diabetic chronic kidney disease: Secondary | ICD-10-CM | POA: Diagnosis not present

## 2018-05-25 DIAGNOSIS — N184 Chronic kidney disease, stage 4 (severe): Secondary | ICD-10-CM | POA: Diagnosis not present

## 2018-05-25 DIAGNOSIS — I5032 Chronic diastolic (congestive) heart failure: Secondary | ICD-10-CM | POA: Diagnosis not present

## 2018-05-26 DIAGNOSIS — N184 Chronic kidney disease, stage 4 (severe): Secondary | ICD-10-CM | POA: Diagnosis not present

## 2018-05-26 DIAGNOSIS — E11610 Type 2 diabetes mellitus with diabetic neuropathic arthropathy: Secondary | ICD-10-CM | POA: Diagnosis not present

## 2018-05-26 DIAGNOSIS — E1165 Type 2 diabetes mellitus with hyperglycemia: Secondary | ICD-10-CM | POA: Diagnosis not present

## 2018-05-26 DIAGNOSIS — I429 Cardiomyopathy, unspecified: Secondary | ICD-10-CM | POA: Diagnosis not present

## 2018-05-26 DIAGNOSIS — I130 Hypertensive heart and chronic kidney disease with heart failure and stage 1 through stage 4 chronic kidney disease, or unspecified chronic kidney disease: Secondary | ICD-10-CM | POA: Diagnosis not present

## 2018-05-26 DIAGNOSIS — I5032 Chronic diastolic (congestive) heart failure: Secondary | ICD-10-CM | POA: Diagnosis not present

## 2018-05-26 DIAGNOSIS — Z6841 Body Mass Index (BMI) 40.0 and over, adult: Secondary | ICD-10-CM | POA: Diagnosis not present

## 2018-05-26 DIAGNOSIS — Z4789 Encounter for other orthopedic aftercare: Secondary | ICD-10-CM | POA: Diagnosis not present

## 2018-05-26 DIAGNOSIS — E1122 Type 2 diabetes mellitus with diabetic chronic kidney disease: Secondary | ICD-10-CM | POA: Diagnosis not present

## 2018-05-27 DIAGNOSIS — I130 Hypertensive heart and chronic kidney disease with heart failure and stage 1 through stage 4 chronic kidney disease, or unspecified chronic kidney disease: Secondary | ICD-10-CM | POA: Diagnosis not present

## 2018-05-27 DIAGNOSIS — Z794 Long term (current) use of insulin: Secondary | ICD-10-CM | POA: Diagnosis not present

## 2018-05-27 DIAGNOSIS — E1165 Type 2 diabetes mellitus with hyperglycemia: Secondary | ICD-10-CM | POA: Diagnosis not present

## 2018-05-27 DIAGNOSIS — Z6841 Body Mass Index (BMI) 40.0 and over, adult: Secondary | ICD-10-CM | POA: Diagnosis not present

## 2018-05-27 DIAGNOSIS — I429 Cardiomyopathy, unspecified: Secondary | ICD-10-CM | POA: Diagnosis not present

## 2018-05-27 DIAGNOSIS — E11610 Type 2 diabetes mellitus with diabetic neuropathic arthropathy: Secondary | ICD-10-CM | POA: Diagnosis not present

## 2018-05-27 DIAGNOSIS — N184 Chronic kidney disease, stage 4 (severe): Secondary | ICD-10-CM | POA: Diagnosis not present

## 2018-05-27 DIAGNOSIS — Z4789 Encounter for other orthopedic aftercare: Secondary | ICD-10-CM | POA: Diagnosis not present

## 2018-05-27 DIAGNOSIS — I5032 Chronic diastolic (congestive) heart failure: Secondary | ICD-10-CM | POA: Diagnosis not present

## 2018-05-27 DIAGNOSIS — E109 Type 1 diabetes mellitus without complications: Secondary | ICD-10-CM | POA: Diagnosis not present

## 2018-05-27 DIAGNOSIS — E1122 Type 2 diabetes mellitus with diabetic chronic kidney disease: Secondary | ICD-10-CM | POA: Diagnosis not present

## 2018-05-31 DIAGNOSIS — E11610 Type 2 diabetes mellitus with diabetic neuropathic arthropathy: Secondary | ICD-10-CM | POA: Diagnosis not present

## 2018-05-31 DIAGNOSIS — Z4789 Encounter for other orthopedic aftercare: Secondary | ICD-10-CM | POA: Diagnosis not present

## 2018-05-31 DIAGNOSIS — I5032 Chronic diastolic (congestive) heart failure: Secondary | ICD-10-CM | POA: Diagnosis not present

## 2018-05-31 DIAGNOSIS — N184 Chronic kidney disease, stage 4 (severe): Secondary | ICD-10-CM | POA: Diagnosis not present

## 2018-05-31 DIAGNOSIS — I130 Hypertensive heart and chronic kidney disease with heart failure and stage 1 through stage 4 chronic kidney disease, or unspecified chronic kidney disease: Secondary | ICD-10-CM | POA: Diagnosis not present

## 2018-05-31 DIAGNOSIS — E1165 Type 2 diabetes mellitus with hyperglycemia: Secondary | ICD-10-CM | POA: Diagnosis not present

## 2018-05-31 DIAGNOSIS — E1122 Type 2 diabetes mellitus with diabetic chronic kidney disease: Secondary | ICD-10-CM | POA: Diagnosis not present

## 2018-05-31 DIAGNOSIS — I429 Cardiomyopathy, unspecified: Secondary | ICD-10-CM | POA: Diagnosis not present

## 2018-05-31 DIAGNOSIS — Z6841 Body Mass Index (BMI) 40.0 and over, adult: Secondary | ICD-10-CM | POA: Diagnosis not present

## 2018-06-01 DIAGNOSIS — E11610 Type 2 diabetes mellitus with diabetic neuropathic arthropathy: Secondary | ICD-10-CM | POA: Diagnosis not present

## 2018-06-01 DIAGNOSIS — N184 Chronic kidney disease, stage 4 (severe): Secondary | ICD-10-CM | POA: Diagnosis not present

## 2018-06-01 DIAGNOSIS — E1122 Type 2 diabetes mellitus with diabetic chronic kidney disease: Secondary | ICD-10-CM | POA: Diagnosis not present

## 2018-06-01 DIAGNOSIS — I5032 Chronic diastolic (congestive) heart failure: Secondary | ICD-10-CM | POA: Diagnosis not present

## 2018-06-01 DIAGNOSIS — I429 Cardiomyopathy, unspecified: Secondary | ICD-10-CM | POA: Diagnosis not present

## 2018-06-01 DIAGNOSIS — Z4789 Encounter for other orthopedic aftercare: Secondary | ICD-10-CM | POA: Diagnosis not present

## 2018-06-01 DIAGNOSIS — E1165 Type 2 diabetes mellitus with hyperglycemia: Secondary | ICD-10-CM | POA: Diagnosis not present

## 2018-06-01 DIAGNOSIS — Z6841 Body Mass Index (BMI) 40.0 and over, adult: Secondary | ICD-10-CM | POA: Diagnosis not present

## 2018-06-01 DIAGNOSIS — I130 Hypertensive heart and chronic kidney disease with heart failure and stage 1 through stage 4 chronic kidney disease, or unspecified chronic kidney disease: Secondary | ICD-10-CM | POA: Diagnosis not present

## 2018-06-02 DIAGNOSIS — E11610 Type 2 diabetes mellitus with diabetic neuropathic arthropathy: Secondary | ICD-10-CM | POA: Diagnosis not present

## 2018-06-02 DIAGNOSIS — I5032 Chronic diastolic (congestive) heart failure: Secondary | ICD-10-CM | POA: Diagnosis not present

## 2018-06-02 DIAGNOSIS — M1990 Unspecified osteoarthritis, unspecified site: Secondary | ICD-10-CM | POA: Diagnosis not present

## 2018-06-02 DIAGNOSIS — Z4789 Encounter for other orthopedic aftercare: Secondary | ICD-10-CM | POA: Diagnosis not present

## 2018-06-02 DIAGNOSIS — E1140 Type 2 diabetes mellitus with diabetic neuropathy, unspecified: Secondary | ICD-10-CM | POA: Diagnosis not present

## 2018-06-02 DIAGNOSIS — Z6841 Body Mass Index (BMI) 40.0 and over, adult: Secondary | ICD-10-CM | POA: Diagnosis not present

## 2018-06-02 DIAGNOSIS — E1165 Type 2 diabetes mellitus with hyperglycemia: Secondary | ICD-10-CM | POA: Diagnosis not present

## 2018-06-02 DIAGNOSIS — I429 Cardiomyopathy, unspecified: Secondary | ICD-10-CM | POA: Diagnosis not present

## 2018-06-02 DIAGNOSIS — E1122 Type 2 diabetes mellitus with diabetic chronic kidney disease: Secondary | ICD-10-CM | POA: Diagnosis not present

## 2018-06-02 DIAGNOSIS — I130 Hypertensive heart and chronic kidney disease with heart failure and stage 1 through stage 4 chronic kidney disease, or unspecified chronic kidney disease: Secondary | ICD-10-CM | POA: Diagnosis not present

## 2018-06-02 DIAGNOSIS — R2689 Other abnormalities of gait and mobility: Secondary | ICD-10-CM | POA: Diagnosis not present

## 2018-06-02 DIAGNOSIS — M6281 Muscle weakness (generalized): Secondary | ICD-10-CM | POA: Diagnosis not present

## 2018-06-02 DIAGNOSIS — N184 Chronic kidney disease, stage 4 (severe): Secondary | ICD-10-CM | POA: Diagnosis not present

## 2018-06-03 ENCOUNTER — Encounter (HOSPITAL_COMMUNITY): Payer: Self-pay | Admitting: *Deleted

## 2018-06-03 ENCOUNTER — Other Ambulatory Visit: Payer: Self-pay

## 2018-06-03 ENCOUNTER — Emergency Department (HOSPITAL_COMMUNITY): Payer: Medicare HMO

## 2018-06-03 ENCOUNTER — Inpatient Hospital Stay (HOSPITAL_COMMUNITY)
Admission: EM | Admit: 2018-06-03 | Discharge: 2018-06-09 | DRG: 291 | Disposition: A | Discharge status: Home Health | Payer: Medicare HMO | Attending: Family Medicine | Admitting: Family Medicine

## 2018-06-03 DIAGNOSIS — Z4789 Encounter for other orthopedic aftercare: Secondary | ICD-10-CM | POA: Diagnosis not present

## 2018-06-03 DIAGNOSIS — I5023 Acute on chronic systolic (congestive) heart failure: Secondary | ICD-10-CM | POA: Diagnosis present

## 2018-06-03 DIAGNOSIS — M4800 Spinal stenosis, site unspecified: Secondary | ICD-10-CM | POA: Diagnosis present

## 2018-06-03 DIAGNOSIS — Z91030 Bee allergy status: Secondary | ICD-10-CM

## 2018-06-03 DIAGNOSIS — Z961 Presence of intraocular lens: Secondary | ICD-10-CM | POA: Diagnosis present

## 2018-06-03 DIAGNOSIS — E1165 Type 2 diabetes mellitus with hyperglycemia: Secondary | ICD-10-CM | POA: Diagnosis not present

## 2018-06-03 DIAGNOSIS — R0602 Shortness of breath: Secondary | ICD-10-CM | POA: Diagnosis not present

## 2018-06-03 DIAGNOSIS — Z880 Allergy status to penicillin: Secondary | ICD-10-CM

## 2018-06-03 DIAGNOSIS — M810 Age-related osteoporosis without current pathological fracture: Secondary | ICD-10-CM | POA: Diagnosis present

## 2018-06-03 DIAGNOSIS — Z6841 Body Mass Index (BMI) 40.0 and over, adult: Secondary | ICD-10-CM

## 2018-06-03 DIAGNOSIS — Z66 Do not resuscitate: Secondary | ICD-10-CM | POA: Diagnosis present

## 2018-06-03 DIAGNOSIS — G4733 Obstructive sleep apnea (adult) (pediatric): Secondary | ICD-10-CM | POA: Diagnosis present

## 2018-06-03 DIAGNOSIS — E1122 Type 2 diabetes mellitus with diabetic chronic kidney disease: Secondary | ICD-10-CM | POA: Diagnosis not present

## 2018-06-03 DIAGNOSIS — I130 Hypertensive heart and chronic kidney disease with heart failure and stage 1 through stage 4 chronic kidney disease, or unspecified chronic kidney disease: Secondary | ICD-10-CM | POA: Diagnosis not present

## 2018-06-03 DIAGNOSIS — Z1623 Resistance to quinolones and fluoroquinolones: Secondary | ICD-10-CM | POA: Diagnosis present

## 2018-06-03 DIAGNOSIS — N3000 Acute cystitis without hematuria: Secondary | ICD-10-CM

## 2018-06-03 DIAGNOSIS — Z9851 Tubal ligation status: Secondary | ICD-10-CM

## 2018-06-03 DIAGNOSIS — Z794 Long term (current) use of insulin: Secondary | ICD-10-CM

## 2018-06-03 DIAGNOSIS — Z79890 Hormone replacement therapy (postmenopausal): Secondary | ICD-10-CM

## 2018-06-03 DIAGNOSIS — Z91041 Radiographic dye allergy status: Secondary | ICD-10-CM

## 2018-06-03 DIAGNOSIS — I5032 Chronic diastolic (congestive) heart failure: Secondary | ICD-10-CM | POA: Diagnosis not present

## 2018-06-03 DIAGNOSIS — K219 Gastro-esophageal reflux disease without esophagitis: Secondary | ICD-10-CM | POA: Diagnosis present

## 2018-06-03 DIAGNOSIS — E119 Type 2 diabetes mellitus without complications: Secondary | ICD-10-CM

## 2018-06-03 DIAGNOSIS — J9601 Acute respiratory failure with hypoxia: Secondary | ICD-10-CM | POA: Diagnosis present

## 2018-06-03 DIAGNOSIS — E7800 Pure hypercholesterolemia, unspecified: Secondary | ICD-10-CM | POA: Diagnosis present

## 2018-06-03 DIAGNOSIS — Z79899 Other long term (current) drug therapy: Secondary | ICD-10-CM

## 2018-06-03 DIAGNOSIS — I509 Heart failure, unspecified: Secondary | ICD-10-CM | POA: Diagnosis not present

## 2018-06-03 DIAGNOSIS — Z90710 Acquired absence of both cervix and uterus: Secondary | ICD-10-CM

## 2018-06-03 DIAGNOSIS — Z888 Allergy status to other drugs, medicaments and biological substances status: Secondary | ICD-10-CM

## 2018-06-03 DIAGNOSIS — Z7982 Long term (current) use of aspirin: Secondary | ICD-10-CM

## 2018-06-03 DIAGNOSIS — Z9841 Cataract extraction status, right eye: Secondary | ICD-10-CM

## 2018-06-03 DIAGNOSIS — E876 Hypokalemia: Secondary | ICD-10-CM | POA: Diagnosis present

## 2018-06-03 DIAGNOSIS — I491 Atrial premature depolarization: Secondary | ICD-10-CM | POA: Diagnosis not present

## 2018-06-03 DIAGNOSIS — B9620 Unspecified Escherichia coli [E. coli] as the cause of diseases classified elsewhere: Secondary | ICD-10-CM | POA: Diagnosis present

## 2018-06-03 DIAGNOSIS — E785 Hyperlipidemia, unspecified: Secondary | ICD-10-CM | POA: Diagnosis present

## 2018-06-03 DIAGNOSIS — J42 Unspecified chronic bronchitis: Secondary | ICD-10-CM | POA: Diagnosis present

## 2018-06-03 DIAGNOSIS — I4891 Unspecified atrial fibrillation: Secondary | ICD-10-CM

## 2018-06-03 DIAGNOSIS — F419 Anxiety disorder, unspecified: Secondary | ICD-10-CM | POA: Diagnosis present

## 2018-06-03 DIAGNOSIS — N184 Chronic kidney disease, stage 4 (severe): Secondary | ICD-10-CM | POA: Diagnosis not present

## 2018-06-03 DIAGNOSIS — Z7901 Long term (current) use of anticoagulants: Secondary | ICD-10-CM

## 2018-06-03 DIAGNOSIS — M419 Scoliosis, unspecified: Secondary | ICD-10-CM | POA: Diagnosis present

## 2018-06-03 DIAGNOSIS — Z9049 Acquired absence of other specified parts of digestive tract: Secondary | ICD-10-CM

## 2018-06-03 DIAGNOSIS — E11610 Type 2 diabetes mellitus with diabetic neuropathic arthropathy: Secondary | ICD-10-CM | POA: Diagnosis not present

## 2018-06-03 DIAGNOSIS — I4819 Other persistent atrial fibrillation: Secondary | ICD-10-CM | POA: Diagnosis present

## 2018-06-03 DIAGNOSIS — I429 Cardiomyopathy, unspecified: Secondary | ICD-10-CM | POA: Diagnosis not present

## 2018-06-03 DIAGNOSIS — Z8249 Family history of ischemic heart disease and other diseases of the circulatory system: Secondary | ICD-10-CM

## 2018-06-03 DIAGNOSIS — J45909 Unspecified asthma, uncomplicated: Secondary | ICD-10-CM | POA: Diagnosis present

## 2018-06-03 DIAGNOSIS — E559 Vitamin D deficiency, unspecified: Secondary | ICD-10-CM | POA: Diagnosis present

## 2018-06-03 DIAGNOSIS — Z9842 Cataract extraction status, left eye: Secondary | ICD-10-CM

## 2018-06-03 DIAGNOSIS — E1142 Type 2 diabetes mellitus with diabetic polyneuropathy: Secondary | ICD-10-CM | POA: Diagnosis present

## 2018-06-03 LAB — CBC WITH DIFFERENTIAL/PLATELET
Abs Immature Granulocytes: 0.04 10*3/uL (ref 0.00–0.07)
Basophils Absolute: 0.1 10*3/uL (ref 0.0–0.1)
Basophils Relative: 1 %
Eosinophils Absolute: 0.4 10*3/uL (ref 0.0–0.5)
Eosinophils Relative: 6 %
HCT: 39 % (ref 36.0–46.0)
Hemoglobin: 11.6 g/dL — ABNORMAL LOW (ref 12.0–15.0)
Immature Granulocytes: 1 %
Lymphocytes Relative: 23 %
Lymphs Abs: 1.7 10*3/uL (ref 0.7–4.0)
MCH: 27 pg (ref 26.0–34.0)
MCHC: 29.7 g/dL — ABNORMAL LOW (ref 30.0–36.0)
MCV: 90.7 fL (ref 80.0–100.0)
Monocytes Absolute: 0.7 10*3/uL (ref 0.1–1.0)
Monocytes Relative: 9 %
Neutro Abs: 4.5 10*3/uL (ref 1.7–7.7)
Neutrophils Relative %: 60 %
Platelets: 180 10*3/uL (ref 150–400)
RBC: 4.3 MIL/uL (ref 3.87–5.11)
RDW: 16.6 % — ABNORMAL HIGH (ref 11.5–15.5)
WBC: 7.5 10*3/uL (ref 4.0–10.5)
nRBC: 0 % (ref 0.0–0.2)

## 2018-06-03 LAB — URINALYSIS, ROUTINE W REFLEX MICROSCOPIC
Bilirubin Urine: NEGATIVE
Glucose, UA: NEGATIVE mg/dL
Hgb urine dipstick: NEGATIVE
Ketones, ur: NEGATIVE mg/dL
Nitrite: POSITIVE — AB
Protein, ur: NEGATIVE mg/dL
Specific Gravity, Urine: 1.011 (ref 1.005–1.030)
pH: 6 (ref 5.0–8.0)

## 2018-06-03 LAB — BASIC METABOLIC PANEL
Anion gap: 13 (ref 5–15)
BUN: 66 mg/dL — ABNORMAL HIGH (ref 8–23)
CO2: 28 mmol/L (ref 22–32)
Calcium: 9.3 mg/dL (ref 8.9–10.3)
Chloride: 99 mmol/L (ref 98–111)
Creatinine, Ser: 2.29 mg/dL — ABNORMAL HIGH (ref 0.44–1.00)
GFR calc Af Amer: 24 mL/min — ABNORMAL LOW (ref >60)
GFR calc non Af Amer: 21 mL/min — ABNORMAL LOW (ref >60)
Glucose, Bld: 218 mg/dL — ABNORMAL HIGH (ref 70–99)
Potassium: 3.2 mmol/L — ABNORMAL LOW (ref 3.5–5.1)
Sodium: 140 mmol/L (ref 135–145)

## 2018-06-03 LAB — BRAIN NATRIURETIC PEPTIDE: B Natriuretic Peptide: 198.3 pg/mL — ABNORMAL HIGH (ref 0.0–100.0)

## 2018-06-03 LAB — GLUCOSE, CAPILLARY
Glucose-Capillary: 180 mg/dL — ABNORMAL HIGH (ref 70–99)
Glucose-Capillary: 150 mg/dL — ABNORMAL HIGH (ref 70–99)

## 2018-06-03 LAB — HEMOGLOBIN A1C
Hgb A1c MFr Bld: 7.2 % — ABNORMAL HIGH (ref 4.8–5.6)
Mean Plasma Glucose: 159.94 mg/dL

## 2018-06-03 LAB — INFLUENZA PANEL BY PCR (TYPE A & B)
Influenza A By PCR: NEGATIVE
Influenza B By PCR: NEGATIVE

## 2018-06-03 LAB — CBG MONITORING, ED: Glucose-Capillary: 174 mg/dL — ABNORMAL HIGH (ref 70–99)

## 2018-06-03 LAB — TROPONIN I: Troponin I: <0.03 ng/mL (ref <0.03)

## 2018-06-03 MED ORDER — ONDANSETRON HCL 4 MG/2ML IJ SOLN: 4.0000 mg | Freq: Four times a day (QID) | INTRAMUSCULAR | Status: DC | PRN

## 2018-06-03 MED ORDER — FUROSEMIDE 10 MG/ML IJ SOLN
60.0000 mg | Freq: Three times a day (TID) | INTRAMUSCULAR | Status: DC
  Administered 2018-06-03 – 2018-06-05 (×5): 60 mg via INTRAVENOUS
  Filled 2018-06-03 (×5): qty 6

## 2018-06-03 MED ORDER — PREGABALIN 75 MG PO CAPS
150.0000 mg | ORAL_CAPSULE | Freq: Every day | ORAL | Status: DC
  Administered 2018-06-03 – 2018-06-08 (×6): 150 mg via ORAL
  Filled 2018-06-03 (×6): qty 2

## 2018-06-03 MED ORDER — RIVAROXABAN 15 MG PO TABS
15.0000 mg | ORAL_TABLET | Freq: Every day | ORAL | Status: DC
  Administered 2018-06-03 – 2018-06-08 (×6): 15 mg via ORAL
  Filled 2018-06-03 (×6): qty 1

## 2018-06-03 MED ORDER — PREGABALIN 25 MG PO CAPS: 50.0000 mg | ORAL_CAPSULE | ORAL | Status: DC

## 2018-06-03 MED ORDER — POTASSIUM CHLORIDE CRYS ER 20 MEQ PO TBCR
20.0000 meq | EXTENDED_RELEASE_TABLET | Freq: Two times a day (BID) | ORAL | Status: DC
  Administered 2018-06-03 – 2018-06-09 (×12): 20 meq via ORAL
  Filled 2018-06-03 (×12): qty 1

## 2018-06-03 MED ORDER — INSULIN GLARGINE 100 UNIT/ML ~~LOC~~ SOLN
96.0000 [IU] | SUBCUTANEOUS | Status: DC
  Administered 2018-06-04 – 2018-06-09 (×5): 96 [IU] via SUBCUTANEOUS
  Filled 2018-06-03 (×6): qty 0.96

## 2018-06-03 MED ORDER — FEBUXOSTAT 40 MG PO TABS
80.0000 mg | ORAL_TABLET | Freq: Every day | ORAL | Status: DC
  Administered 2018-06-04 – 2018-06-09 (×6): 80 mg via ORAL
  Filled 2018-06-03 (×6): qty 2

## 2018-06-03 MED ORDER — ATORVASTATIN CALCIUM 10 MG PO TABS
20.0000 mg | ORAL_TABLET | Freq: Every day | ORAL | Status: DC
  Administered 2018-06-04 – 2018-06-08 (×5): 20 mg via ORAL
  Filled 2018-06-03 (×5): qty 2

## 2018-06-03 MED ORDER — POTASSIUM CHLORIDE CRYS ER 20 MEQ PO TBCR
60.0000 meq | EXTENDED_RELEASE_TABLET | Freq: Once | ORAL | Status: AC
  Administered 2018-06-03: 60 meq via ORAL
  Filled 2018-06-03: qty 3

## 2018-06-03 MED ORDER — SODIUM CHLORIDE 0.9 % IV SOLN
250.0000 mL | INTRAVENOUS | Status: DC | PRN
  Administered 2018-06-05: 250 mL via INTRAVENOUS

## 2018-06-03 MED ORDER — INSULIN DEGLUDEC 200 UNIT/ML ~~LOC~~ SOPN: 96.0000 [IU] | PEN_INJECTOR | SUBCUTANEOUS | Status: DC

## 2018-06-03 MED ORDER — ASPIRIN EC 81 MG PO TBEC
81.0000 mg | DELAYED_RELEASE_TABLET | Freq: Every day | ORAL | Status: DC
  Administered 2018-06-06: 81 mg via ORAL
  Filled 2018-06-03 (×5): qty 1

## 2018-06-03 MED ORDER — FUROSEMIDE 10 MG/ML IJ SOLN
80.0000 mg | Freq: Once | INTRAMUSCULAR | Status: AC
  Administered 2018-06-03: 80 mg via INTRAVENOUS
  Filled 2018-06-03: qty 8

## 2018-06-03 MED ORDER — SODIUM CHLORIDE 0.9% FLUSH
3.0000 mL | Freq: Two times a day (BID) | INTRAVENOUS | Status: DC
  Administered 2018-06-03 – 2018-06-09 (×11): 3 mL via INTRAVENOUS

## 2018-06-03 MED ORDER — ACETAMINOPHEN 325 MG PO TABS
650.0000 mg | ORAL_TABLET | ORAL | Status: DC | PRN
  Administered 2018-06-06 – 2018-06-09 (×2): 650 mg via ORAL
  Filled 2018-06-03 (×2): qty 2

## 2018-06-03 MED ORDER — CIPROFLOXACIN IN D5W 200 MG/100ML IV SOLN
200.0000 mg | Freq: Two times a day (BID) | INTRAVENOUS | Status: DC
  Administered 2018-06-03 – 2018-06-04 (×3): 200 mg via INTRAVENOUS
  Filled 2018-06-03 (×6): qty 100

## 2018-06-03 MED ORDER — PREGABALIN 25 MG PO CAPS
50.0000 mg | ORAL_CAPSULE | ORAL | Status: DC
  Administered 2018-06-03 – 2018-06-08 (×6): 50 mg via ORAL
  Filled 2018-06-03 (×6): qty 2

## 2018-06-03 MED ORDER — ALPRAZOLAM 0.25 MG PO TABS: 0.1250 mg | ORAL_TABLET | Freq: Two times a day (BID) | ORAL | Status: DC | PRN

## 2018-06-03 MED ORDER — PREGABALIN 25 MG PO CAPS
50.0000 mg | ORAL_CAPSULE | Freq: Every day | ORAL | Status: DC
  Administered 2018-06-04 – 2018-06-09 (×7): 50 mg via ORAL
  Filled 2018-06-03 (×7): qty 2

## 2018-06-03 MED ORDER — INSULIN ASPART 100 UNIT/ML ~~LOC~~ SOLN
0.0000 [IU] | Freq: Three times a day (TID) | SUBCUTANEOUS | Status: DC
  Administered 2018-06-03 – 2018-06-04 (×2): 3 [IU] via SUBCUTANEOUS
  Administered 2018-06-04 (×2): 2 [IU] via SUBCUTANEOUS
  Administered 2018-06-05 – 2018-06-07 (×5): 3 [IU] via SUBCUTANEOUS
  Administered 2018-06-07 – 2018-06-09 (×5): 2 [IU] via SUBCUTANEOUS

## 2018-06-03 MED ORDER — CIPROFLOXACIN IN D5W 400 MG/200ML IV SOLN
400.0000 mg | Freq: Once | INTRAVENOUS | Status: AC
  Administered 2018-06-03: 400 mg via INTRAVENOUS
  Filled 2018-06-03: qty 200

## 2018-06-03 MED ORDER — INSULIN ASPART 100 UNIT/ML ~~LOC~~ SOLN
0.0000 [IU] | Freq: Every day | SUBCUTANEOUS | Status: DC
  Administered 2018-06-04: 2 [IU] via SUBCUTANEOUS

## 2018-06-03 MED ORDER — METOPROLOL SUCCINATE ER 50 MG PO TB24
50.0000 mg | ORAL_TABLET | Freq: Two times a day (BID) | ORAL | Status: DC
  Administered 2018-06-03 – 2018-06-09 (×11): 50 mg via ORAL
  Filled 2018-06-03 (×5): qty 1
  Filled 2018-06-03: qty 2
  Filled 2018-06-03 (×2): qty 1
  Filled 2018-06-03: qty 2
  Filled 2018-06-03 (×3): qty 1

## 2018-06-03 MED ORDER — SODIUM CHLORIDE 0.9% FLUSH: 3.0000 mL | INTRAVENOUS | Status: DC | PRN

## 2018-06-03 MED ORDER — LEVOTHYROXINE SODIUM 50 MCG PO TABS
50.0000 ug | ORAL_TABLET | Freq: Every day | ORAL | Status: DC
  Administered 2018-06-04 – 2018-06-09 (×6): 50 ug via ORAL
  Filled 2018-06-03 (×6): qty 1

## 2018-06-03 NOTE — ED Triage Notes (Signed)
States her home health nurse came to her house today for therapy on her left foot and noted her sats at 79%, c/o sob x 1  Week with productive cough thick white sputum.up arrival able to speak in complete sentences. sats increased with 02

## 2018-06-03 NOTE — ED Notes (Signed)
ED TO INPATIENT HANDOFF REPORT  ED Nurse Name and Phone #: Alvera Novel [phone]  S Name/Age/Gender Kristine [name] 73 y.o. female Room/Bed: 032C/032C  Code Status   Code Status: Prior  Home/SNF/Other Home Patient oriented to: self, place, time and situation Is this baseline? Yes   Triage Complete: Triage complete  Chief Complaint SOB  Triage Note States her home health nurse came to her house today for therapy on her left foot and noted her sats at 79%, c/o sob x 1  Week with productive cough thick white sputum.up arrival able to speak in complete sentences. sats increased with 02   Allergies Allergies  Allergen Reactions  . Albuterol Other (See Comments)    REACTION: Tachycardia- AFib  . Epinephrine Other (See Comments)    Increases heart rate per patient.   . Penicillins Other (See Comments)    REACTION: flushing \\T \ hot  Did it involve swelling of the face/tongue/throat, SOB, or low BP? No Did it involve sudden or severe rash/hives, skin peeling, or any reaction on the inside of your mouth or nose? No Did you need to seek medical attention at a hospital or doctor's office? No When did it last happen?20 years ago If all above answers are "NO", may proceed with cephalosporin use.  . Torsemide Swelling  . Bee Venom Other (See Comments)    "makes me nervous"  . Ivp Dye [Iodinated Diagnostic Agents] Other (See Comments)    flushing    Level of Care/Admitting Diagnosis ED Disposition    ED Disposition Condition Comment   Admit  Hospital Area: [place] [[id number]]  Level of Care: Telemetry Cardiac [103]  I expect the patient will be discharged within 24 hours: Yes  LOW acuity---Tx typically complete <24 hrs---ACUTE conditions typically can be evaluated <24 hours---LABS likely to return to acceptable levels <24 hours---IS near functional baseline---EXPECTED to return to current living arrangement---NOT newly hypoxic: Meets criteria for  5C-Observation unit  Diagnosis: Acute on chronic systolic CHF (congestive heart failure) [facility]) [[id number]]  Admitting Physician: Karmen Bongo [[id number]]  Attending Physician: Karmen Bongo [[id number]]  PT Class (Do Not Modify): Observation [104]  PT Acc Code (Do Not Modify): Observation [10022]       B Medical/Surgery History Past Medical History:  Diagnosis Date  . Anxiety   . Asthma   . Atrial fibrillation ([place])   . CHF (congestive heart failure) ([place])   . Chronic bronchitis ([place])   . GERD (gastroesophageal reflux disease)   . High cholesterol   . History of hiatal hernia   . HTN (hypertension)   . Mild aortic sclerosis ([place])   . Morbid obesity ([place])   . Osteoporosis   . Persistent atrial fibrillation   . Scoliosis   . Spinal stenosis   . Type II diabetes mellitus ([place])   . Vitamin D deficiency    Past Surgical History:  Procedure Laterality Date  . BLADDER SUSPENSION  1980s  . CARDIAC CATHETERIZATION  11/16/09   SMOOTH AND NORMAL  . CARDIOVERSION N/A 01/07/2015   Procedure: CARDIOVERSION;  Surgeon: Skeet Latch, MD;  Location: [facility] ENDOSCOPY;  Service: Cardiovascular;  Laterality: N/A;  . CARDIOVERSION N/A 03/11/2015   Procedure: CARDIOVERSION;  Surgeon: Skeet Latch, MD;  Location: [place];  Service: Cardiovascular;  Laterality: N/A;  . CARPAL TUNNEL RELEASE Left 1970s  . CATARACT EXTRACTION W/ INTRAOCULAR LENS  IMPLANT, BILATERAL Bilateral ~ 2010  . COLONOSCOPY  08/14/08  . FINGER FRACTURE SURGERY Right 1970s   "ring finger"  .  FRACTURE SURGERY    . LAPAROSCOPIC CHOLECYSTECTOMY  1980s  . TUBAL LIGATION    . VAGINAL HYSTERECTOMY  1980     A IV Location/Drains/Wounds Patient Lines/Drains/Airways Status   Active Line/Drains/Airways    Name:   Placement date:   Placement time:   Site:   Days:   Peripheral IV 06/03/18 Right Wrist   06/03/18    1055    Wrist   less than 1          Intake/Output Last 24 hours No intake or output data in the 24 hours  ending 06/03/18 1453  Labs/Imaging Results for orders placed or performed during the hospital encounter of 06/03/18 (from the past 48 hour(s))  CBC with Differential     Status: Abnormal   Collection Time: 06/03/18 11:17 AM  Result Value Ref Range   WBC 7.5 4.0 - 10.5 K/uL   RBC 4.30 3.87 - 5.11 MIL/uL   Hemoglobin 11.6 (L) 12.0 - 15.0 g/dL   HCT 39.0 36.0 - 46.0 %   MCV 90.7 80.0 - 100.0 fL   MCH 27.0 26.0 - 34.0 pg   MCHC 29.7 (L) 30.0 - 36.0 g/dL   RDW 16.6 (H) 11.5 - 15.5 %   Platelets 180 150 - 400 K/uL   nRBC 0.0 0.0 - 0.2 %   Neutrophils Relative % 60 %   Neutro Abs 4.5 1.7 - 7.7 K/uL   Lymphocytes Relative 23 %   Lymphs Abs 1.7 0.7 - 4.0 K/uL   Monocytes Relative 9 %   Monocytes Absolute 0.7 0.1 - 1.0 K/uL   Eosinophils Relative 6 %   Eosinophils Absolute 0.4 0.0 - 0.5 K/uL   Basophils Relative 1 %   Basophils Absolute 0.1 0.0 - 0.1 K/uL   Immature Granulocytes 1 %   Abs Immature Granulocytes 0.04 0.00 - 0.07 K/uL    Comment: Performed at [facility], [address], [place], [place] [address]  Basic metabolic panel     Status: Abnormal   Collection Time: 06/03/18 11:17 AM  Result Value Ref Range   Sodium 140 135 - 145 mmol/L   Potassium 3.2 (L) 3.5 - 5.1 mmol/L   Chloride 99 98 - 111 mmol/L   CO2 28 22 - 32 mmol/L   Glucose, Bld 218 (H) 70 - 99 mg/dL   BUN 66 (H) 8 - 23 mg/dL   Creatinine, Ser 2.29 (H) 0.44 - 1.00 mg/dL   Calcium 9.3 8.9 - 10.3 mg/dL   GFR calc non Af Amer 21 (L) >60 mL/min   GFR calc Af Amer 24 (L) >60 mL/min   Anion gap 13 5 - 15    Comment: Performed at [facility] [address], [place], [place] [address]  Brain natriuretic peptide     Status: Abnormal   Collection Time: 06/03/18 11:17 AM  Result Value Ref Range   B Natriuretic Peptide 198.3 (H) 0.0 - 100.0 pg/mL    Comment: Performed at [facility] [address], [place], [place] [address]  Troponin I - ONCE - STAT     Status: None   Collection Time: 06/03/18  11:17 AM  Result Value Ref Range   Troponin I <0.03 <0.03 ng/mL    Comment: Performed at [facility] [address] [place], [place] [address]  Influenza panel by PCR (type A & B)     Status: None   Collection Time: 06/03/18 11:17 AM  Result Value Ref  Range   Influenza A By PCR NEGATIVE NEGATIVE   Influenza B By PCR NEGATIVE NEGATIVE    Comment: (NOTE) The Xpert Xpress Flu assay is intended as an aid in the diagnosis of  influenza and should not be used as a sole basis for treatment.  This  assay is FDA approved for nasopharyngeal swab specimens only. Nasal  washings and aspirates are unacceptable for Xpert Xpress Flu testing. Performed at [facility], [place] [address], [place], [place] [address]   Urinalysis, Routine w reflex microscopic     Status: Abnormal   Collection Time: 06/03/18 12:52 PM  Result Value Ref Range   Color, Urine YELLOW YELLOW   APPearance CLEAR CLEAR   Specific Gravity, Urine 1.011 1.005 - 1.030   pH 6.0 5.0 - 8.0   Glucose, UA NEGATIVE NEGATIVE mg/dL   Hgb urine dipstick NEGATIVE NEGATIVE   Bilirubin Urine NEGATIVE NEGATIVE   Ketones, ur NEGATIVE NEGATIVE mg/dL   Protein, ur NEGATIVE NEGATIVE mg/dL   Nitrite POSITIVE (A) NEGATIVE   Leukocytes,Ua TRACE (A) NEGATIVE   WBC, UA 0-5 0 - 5 WBC/hpf   Bacteria, UA RARE (A) NONE SEEN   Squamous Epithelial / LPF 0-5 0 - 5   WBC Clumps PRESENT     Comment: Performed at [facility], [place] [address] [place], [place] [address]  CBG monitoring, ED     Status: Abnormal   Collection Time: 06/03/18  1:28 PM  Result Value Ref Range   Glucose-Capillary 174 (H) 70 - 99 mg/dL   Dg Chest 2 View  Result Date: 06/03/2018 CLINICAL DATA:  Shortness of breath EXAM: CHEST - 2 VIEW COMPARISON:  08/21/2017 and prior radiographs FINDINGS: Cardiomegaly with pulmonary vascular congestion noted. Mild interstitial opacities are identified and may represent mild interstitial edema. No pleural effusion or  pneumothorax. No acute bony abnormality identified. IMPRESSION: Cardiomegaly with pulmonary vascular congestion. Mild interstitial opacities are nonspecific but may represent mild interstitial edema. Electronically Signed   By: Margarette Canada M.D.   On: 06/03/2018 12:00    Pending Labs Unresulted Labs (From admission, onward)    Start     Ordered   06/03/18 1358  Urine culture  ONCE - STAT,   STAT     06/03/18 1357          Vitals/Pain Today's Vitals   06/03/18 1345 06/03/18 1400 06/03/18 1403 06/03/18 1415  BP: 118/61 132/83 124/80 128/63  Pulse: 82 92 (!) 101 83  Resp: 20 15 16 17   Temp:      TempSrc:      SpO2: 96% 100% 99% 96%  Weight:      Height:      PainSc:        Isolation Precautions Droplet precaution  Medications Medications  ciprofloxacin (CIPRO) IVPB 400 mg (400 mg Intravenous New Bag/Given 06/03/18 1414)  furosemide (LASIX) injection 80 mg (80 mg Intravenous Given 06/03/18 1306)  potassium chloride SA (K-DUR,KLOR-CON) CR tablet 60 mEq (60 mEq Oral Given 06/03/18 1306)    Mobility walks with person assist Low fall risk   Focused Assessments Pulmonary Assessment Handoff:  Lung sounds: Bilateral Breath Sounds: Diminished, Clear L Breath Sounds: Diminished, Clear R Breath Sounds: Diminished, Clear O2 Device: Nasal Cannula O2 Flow Rate (L/min): 2 L/min      R Recommendations: See Admitting Provider Note  Report given to:   Additional Notes: Non weight bearing left leg d/t recent surgery. Pt able to stand with stand by assist  to either move to [facility] or wheel chair.

## 2018-06-03 NOTE — H&P (Signed)
History and Physical    Kristine [name] YME:[id number] DOB: 1945/09/06 DOA: 06/03/2018  PCP: Prince Solian, MD Consultants:  Joelyn Oms - nephrology; Nahser - cardiology; Vogler - podiatry Patient coming from:  Home - lives with husband; NOK: Husband, [phone]  Chief Complaint: SOB  HPI: Kristine [name] is a 73 y.o. female with medical history significant of DM; afib on Xarelto; morbid obesity; HTN; HLD; Charcot foot; and CHF presenting with SOB.  She has been having PT/OT at home.  Each time they came this week, her O2 was low.  Today, it was 79% persistently.  She has been SOB with therapy.  She has had orthopnea.  +LE edema despite "high doses" of Lasix - Dr. Joelyn Oms decreased it last week because her potassium was low.  She hasn't been able to sleep.  She has not been able to weigh herself because of the boot.  At the end of her exercise, she noticed a little chest tightness.   ED Course:   Likely CHF - SOB worsening recently.  O2 in 80s today on RA.  On Xarelto and compliant.  +dysuria with a UTI - given Cipro.  Review of Systems: As per HPI; otherwise review of systems reviewed and negative.   Ambulatory Status:  Currently 50% weight bearing  Past Medical History:  Diagnosis Date  . Anxiety   . Asthma   . Atrial fibrillation ([place])   . CHF (congestive heart failure) ([place])   . Chronic bronchitis ([place])   . GERD (gastroesophageal reflux disease)   . High cholesterol   . History of hiatal hernia   . HTN (hypertension)   . Mild aortic sclerosis ([place])   . Morbid obesity ([place])   . Osteoporosis   . Persistent atrial fibrillation   . Scoliosis   . Spinal stenosis   . Type II diabetes mellitus ([place])   . Vitamin D deficiency     Past Surgical History:  Procedure Laterality Date  . BLADDER SUSPENSION  1980s  . CARDIAC CATHETERIZATION  11/16/09   SMOOTH AND NORMAL  . CARDIOVERSION N/A 01/07/2015   Procedure: CARDIOVERSION;  Surgeon: Skeet Latch, MD;  Location: [facility]  ENDOSCOPY;  Service: Cardiovascular;  Laterality: N/A;  . CARDIOVERSION N/A 03/11/2015   Procedure: CARDIOVERSION;  Surgeon: Skeet Latch, MD;  Location: [place];  Service: Cardiovascular;  Laterality: N/A;  . CARPAL TUNNEL RELEASE Left 1970s  . CATARACT EXTRACTION W/ INTRAOCULAR LENS  IMPLANT, BILATERAL Bilateral ~ 2010  . COLONOSCOPY  08/14/08  . FINGER FRACTURE SURGERY Right 1970s   "ring finger"  . FRACTURE SURGERY    . LAPAROSCOPIC CHOLECYSTECTOMY  1980s  . TUBAL LIGATION    . VAGINAL HYSTERECTOMY  1980    Social History   Socioeconomic History  . Marital status: Married    Spouse name: Not on file  . Number of children: 4  . Years of education: 23  . Highest education level: Not on file  Occupational History  . Not on file  Social Needs  . Financial resource strain: Not on file  . Food insecurity:    Worry: Not on file    Inability: Not on file  . Transportation needs:    Medical: Not on file    Non-medical: Not on file  Tobacco Use  . Smoking status: Never Smoker  . Smokeless tobacco: Never Used  Substance and Sexual Activity  . Alcohol use: No  . Drug use: No  . Sexual activity: Yes  Lifestyle  . Physical activity:  Days per week: Not on file    Minutes per session: Not on file  . Stress: Not on file  Relationships  . Social connections:    Talks on phone: Not on file    Gets together: Not on file    Attends religious service: Not on file    Active member of club or organization: Not on file    Attends meetings of clubs or organizations: Not on file    Relationship status: Not on file  . Intimate partner violence:    Fear of current or ex partner: Not on file    Emotionally abused: Not on file    Physically abused: Not on file    Forced sexual activity: Not on file  Other Topics Concern  . Not on file  Social History Narrative   Pt lives in [place] with spouse.   Retired Engineer, production.  RN.   Writes for grants for [employer]  and has been able to obtain grants from [employer] for [employer].   Right-handed.   1 cup caffeine per day.    Allergies  Allergen Reactions  . Albuterol Other (See Comments)    REACTION: Tachycardia- AFib  . Epinephrine Other (See Comments)    Increases heart rate per patient.   . Penicillins Other (See Comments)    REACTION: flushing \\T \ hot  Did it involve swelling of the face/tongue/throat, SOB, or low BP? No Did it involve sudden or severe rash/hives, skin peeling, or any reaction on the inside of your mouth or nose? No Did you need to seek medical attention at a hospital or doctor's office? No When did it last happen?20 years ago If all above answers are "NO", may proceed with cephalosporin use.  . Torsemide Swelling  . Bee Venom Other (See Comments)    "makes me nervous"  . Ivp Dye [Iodinated Diagnostic Agents] Other (See Comments)    flushing    Family History  Problem Relation Age of Onset  . Stroke Father   . Hypertension Father   . Atrial fibrillation Father        HAD MURMUR  . Heart failure Mother   . Congestive Heart Failure Mother   . Hypertension Mother   . Hypertension Brother   . Hypertension Sister   . Hypertension Son   . CAD Sister        EARLY  . CAD Brother        EARLY    Prior to Admission medications   Medication Sig Start Date End Date Taking? Authorizing Provider  ALPRAZolam (XANAX) 0.25 MG tablet Take 0.125-0.25 mg by mouth 2 (two) times daily as needed for anxiety.  03/26/14  Yes [provider]  amLODipine (NORVASC) 5 MG tablet Take 5 mg by mouth daily.   Yes [provider]  estradiol (ESTRACE) 0.1 MG/GM vaginal cream Place 1 Applicatorful vaginally as needed (Estrace).  12/18/16  Yes [provider]  furosemide (LASIX) 80 MG tablet Take 80 mg by mouth 3 (three) times daily.   Yes [provider]  insulin aspart (NOVOLOG FLEXPEN) 100 UNIT/ML FlexPen Inject 30 Units into the skin 3 (three) times  daily with meals. Plus sliding scale 01/21/18  Yes [provider]  Insulin Degludec (TRESIBA FLEXTOUCH) 200 UNIT/ML SOPN Inject 96 Units into the skin every morning.   Yes [provider]  KLOR-CON M20 20 MEQ tablet Take 60 mg by mouth 2 (two) times daily. 05/18/18  Yes [provider]  levothyroxine (SYNTHROID, LEVOTHROID) 50 MCG tablet Take 50 mcg by mouth daily.  10/24/15  Yes [provider]  LYRICA 50 MG capsule Take 50-150 mg by mouth See admin instructions. Take 50 mg in the morning 50 gm at lunch ups to 150 at bedtime 06/01/17  Yes [provider]  metoprolol succinate (TOPROL-XL) 50 MG 24 hr tablet TAKE 1 AND 1/2 TABLET (75MG ) BY MOUTH TWICE A DAY Patient taking differently: Take 50 mg by mouth 2 (two) times daily.  10/11/17  Yes Nahser, Wonda Cheng, MD  ULORIC 80 MG TABS Take 1 tablet by mouth daily. 04/29/17  Yes [provider]  Vitamin D, Ergocalciferol, (DRISDOL) 50000 UNITS CAPS Take 50,000 Units by mouth every 7 (seven) days. Takes on Thursday   Yes [provider]  XARELTO 15 MG TABS tablet Take 15 mg by mouth daily with supper.  06/27/16  Yes [provider]  BD PEN NEEDLE NANO U/F 32G X 4 MM MISC Inject 1 each as directed See admin instructions. Use pen needles with insulin pens daily 12/24/14   [provider]  clindamycin (CLEOCIN) 300 MG capsule Take 300 mg by mouth once.  03/02/16   [provider]  Lidocaine 0.5 % GEL Apply 4 g topically 3 (three) times daily. Patient not taking: Reported on 06/03/2018 07/29/16   Marcial Pacas, MD    Physical Exam: Vitals:   06/03/18 1403 06/03/18 1415 06/03/18 1500 06/03/18 1555  BP: 124/80 128/63 109/63   Pulse: (!) 101 83 80   Resp: 16 17 19    Temp:   97.7 F (36.5 C)   TempSrc:   Oral   SpO2: 99% 96% 91%   Weight:    125.4 kg  Height:    5\' 8"  (1.727 m)     . General:  Appears calm and comfortable and is NAD . Eyes:  PERRL, EOMI, normal lids, iris .  ENT:  grossly normal hearing, lips & tongue, mmm . Neck:  no LAD, masses or thyromegaly . Cardiovascular:  RRR, no m/r/g. 1+ LLE edema.  [place] Kitchen Respiratory:   CTA bilaterally with no wheezes/rales/rhonchi.  Normal respiratory effort. . Abdomen:  soft, NT, ND, NABS . Skin:  no rash or induration seen on limited exam . Musculoskeletal: L CAM walker in place.  R compression stocking in place. Mar[place] Kitchen Psychiatric:  grossly normal mood and affect, speech fluent and appropriate, AOx3 . Neurologic:  CN 2-12 grossly intact, moves all extremities in coordinated fashion, sensation intact    Radiological Exams on Admission: Dg Chest 2 View  Result Date: 06/03/2018 CLINICAL DATA:  Shortness of breath EXAM: CHEST - 2 VIEW COMPARISON:  08/21/2017 and prior radiographs FINDINGS: Cardiomegaly with pulmonary vascular congestion noted. Mild interstitial opacities are identified and may represent mild interstitial edema. No pleural effusion or pneumothorax. No acute bony abnormality identified. IMPRESSION: Cardiomegaly with pulmonary vascular congestion. Mild interstitial opacities are nonspecific but may represent mild interstitial edema. Electronically Signed   By: Margarette Canada M.D.   On: 06/03/2018 12:00    EKG: Independently reviewed.  NSR with rate 96; nonspecific ST changes with no evidence of acute ischemia   Labs on Admission: I have personally reviewed the available labs and imaging studies at the time of the admission.  Pertinent labs:   Glucose 218 BUN 66/Creatinine 2.29/GFR 21 - stable BNP 198.3 Troponin <0.03 WBC 7.5 Hgb 11.6 Flu negative UA: +nitrite, trace LE, rare bacteria  Assessment/Plan Principal Problem:   Acute on chronic systolic CHF (  congestive heart failure) (HCC) Active Problems:   Diabetes mellitus, type 2 (HCC)   High cholesterol   Atrial fibrillation (HCC)   CKD (chronic kidney disease) stage 4, GFR 15-29 ml/min (HCC)   Acute on chronic systolic CHF -Patient with known CHF  presenting with worsening SOB and hypoxia  -CXR with vascular congestion and possible mild pulmonary edema -Normal WBC count -Elevated BNP but improved from prior -Will place in observation status with telemetry -12/23/17 echo with EF 40-45% -Will start ASA -No ACE due to CKD -Continue Toprol XL -CHF order set utilized -Was given Lasix 80 mg x 1 in ER and will repeat with 60 mg q8h -Continue [place] O2 prn for now -Stable kidney function at this time, will follow -Repeat EKG in AM  HTN -Continue Toprol XL -Hold Norvasc due to risk of edema  HLD -Will start empiric Lipitor 20 mg daily -Lipids were checked in 5/19 and LDL was 91; will repeat  DM -Last A1c was 9.4, but now down to 7.2 -Continue Tresiba -Will cover with moderate-scale SSI for now  Afib -NSR at the time of my evaluation with rate control -Continue Xarelto  Stage IV CKD -Appears to be stable at this time -She does not appear to be a dialysis candidate -Will follow with more aggressive diuresis -Dr. Joelyn Oms was reportedly concerned about hypokalemia and so decreased home Lasix dose; she likely needs a careful balance here -K+ was repleted in the ER    DVT prophylaxis:  Xarelto  Code Status:  DNR - confirmed with patient/family Family Communication:  Husband and daughter were present throughout evaluation Disposition Plan:  Home once clinically improved Consults called: CM/PT/OT  Admission status:  It is my clinical opinion that referral for OBSERVATION is reasonable and necessary in this patient based on the above information provided. The aforementioned taken together are felt to place the patient at high risk for further clinical deterioration. However it is anticipated that the patient may be medically stable for discharge from the hospital within 24 to 48 hours.    Karmen Bongo MD [facility]   How to contact the [facility] Attending or Consulting provider [place] or covering provider during after hours  [place], for this patient?  1. Check the care team in [facility] and look for a) attending/consulting [facility] provider listed and b) the [facility] team listed 2. Log into [email or web address] and use [place]'s universal password to access. If you do not have the password, please contact the hospital operator. 3. Locate the [facility] provider you are looking for under [facility] and page to a number that you can be directly reached. 4. If you still have difficulty reaching the provider, please page the [facility] (Director on Call) for the Hospitalists listed on amion for assistance.   06/03/2018, 6:18 PM

## 2018-06-03 NOTE — ED Provider Notes (Signed)
[name]ERG[facility] DEPARTMENT Provider Note   CSN: [id number] Arrival date & time: 06/03/18  1022    History   Chief Complaint Chief Complaint  Patient presents with  . Shortness of Breath    HPI Kristine [name] is a 73 y.o. [name].   HPI   73 year old [name] with dyspnea.  She states worsened over the past week or so.  She has a home health nurse comes regularly for therapy for history of Charcot foot.  She states that her O2 sat was low at 79% on room air.  She states it was checked several times and then was consistently in the 80s.  She is not on home oxygen.  She does endorse a cough.  Occasionally productive for pinkish sputum but mostly thick and white.  No fevers.  No new swelling.  She does report compliance with her medications including Xarelto for history of atrial fibrillation.  She denies any acute pain.  She also requests that we check her urine because she is concerned that she may have a urinary tract infection.  Past Medical History:  Diagnosis Date  . Anxiety   . Asthma   . Atrial fibrillation ([place])   . CHF (congestive heart failure) ([place])   . Chronic bronchitis ([place])   . GERD (gastroesophageal reflux disease)   . High cholesterol   . History of hiatal hernia   . HTN (hypertension)   . 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 Morbid obesity ([place])   . Osteoporosis   . Persistent atrial fibrillation   . Scoliosis   . Spinal stenosis   . Type II diabetes mellitus ([place])   . Vitamin D deficiency     Patient Active Problem List   Diagnosis Date Noted  . Gait abnormality 02/09/2017  . Obstructive sleep apnea 02/09/2017  . Diabetic peripheral neuropathy ([place]) 09/02/2016  . Peripheral neuropathy 07/29/2016  . Spinal stenosis of lumbar region 07/29/2016  . Excessive daytime sleepiness 07/29/2016  . Morbid obesity ([place]) 07/29/2016  . 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 Community acquired pneumonia 05/31/2015  . CAP (community acquired  pneumonia) 05/31/2015  . Anemia 05/31/2015  . Diastolic CHF, chronic ([place]) 05/31/2015  . Paroxysmal atrial fibrillation ([facility])   . Obesity 10/15/2013  . Diastolic dysfunction with chronic heart failure ([place]) 06/23/2013  . Atrial fibrillation ([place]) 06/20/2010  . Pneumonia   . Abnormal cardiovascular function study   . High cholesterol   . Scoliosis   . Spinal stenosis   . Anxiety   . Osteoarthritis   . Osteoporosis   . 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 Degenerative disc disease   . Vitamin D deficiency   . HEMOPTYSIS UNSPECIFIED 05/12/2010  . Diabetes mellitus, type 2 ([place]) 11/19/2009  . Hypertensive cardiovascular disease 11/19/2009  . DYSPNEA ON EXERTION 11/19/2009    Past Surgical History:  Procedure Laterality Date  . BLADDER SUSPENSION  1980s  . CARDIAC CATHETERIZATION  11/16/09   SMOOTH AND NORMAL  . CARDIOVERSION N/A 01/07/2015   Procedure: CARDIOVERSION;  Surgeon: Skeet Latch, MD;  Location: [facility];  Service: Cardiovascular;  Laterality: N/A;  . CARDIOVERSION N/A 03/11/2015   Procedure: CARDIOVERSION;  Surgeon: Skeet Latch, MD;  Location: [facility];  Service: Cardiovascular;  Laterality: N/A;  . CARPAL TUNNEL RELEASE Left 1970s  . CATARACT EXTRACTION W/ INTRAOCULAR LENS  IMPLANT, BILATERAL Bilateral ~ 2010  . COLONOSCOPY  08/14/08  . FINGER FRACTURE SURGERY Right 1970s   "ring finger"  . FRACTURE SURGERY    .  LAPAROSCOPIC CHOLECYSTECTOMY  1980s  . TUBAL LIGATION    . VAGINAL HYSTERECTOMY  1980     OB History   No obstetric history on file.      Home Medications    Prior to Admission medications   Medication Sig Start Date End Date Taking? Authorizing Provider  ALPRAZolam (XANAX) 0.25 MG tablet Take 0.125-0.25 mg by mouth 2 (two) times daily as needed for anxiety.  03/26/14  Yes [provider]  amLODipine (NORVASC) 5 MG tablet Take 5 mg by mouth daily.   Yes [provider]  estradiol (ESTRACE) 0.1 MG/GM vaginal cream Place  1 Applicatorful vaginally as needed (Estrace).  12/18/16  Yes [provider]  furosemide (LASIX) 80 MG tablet Take 80 mg by mouth 3 (three) times daily.   Yes [provider]  insulin aspart (NOVOLOG FLEXPEN) 100 UNIT/ML FlexPen Inject 30 Units into the skin 3 (three) times daily with meals. Plus sliding scale 01/21/18  Yes [provider]  Insulin Degludec (TRESIBA FLEXTOUCH) 200 UNIT/ML SOPN Inject 96 Units into the skin every morning.   Yes [provider]  KLOR-CON M20 20 MEQ tablet Take 60 mg by mouth 2 (two) times daily. 05/18/18  Yes [provider]  levothyroxine (SYNTHROID, LEVOTHROID) 50 MCG tablet Take 50 mcg by mouth daily.  10/24/15  Yes [provider]  LYRICA 50 MG capsule Take 50-150 mg by mouth See admin instructions. Take 50 mg in the morning 50 gm at lunch ups to 150 at bedtime 06/01/17  Yes [provider]  metoprolol succinate (TOPROL-XL) 50 MG 24 hr tablet TAKE 1 AND 1/2 TABLET (75MG ) BY MOUTH TWICE A DAY Patient taking differently: Take 50 mg by mouth 2 (two) times daily.  10/11/17  Yes Nahser, Wonda Cheng, MD  ULORIC 80 MG TABS Take 1 tablet by mouth daily. 04/29/17  Yes [provider]  Vitamin D, Ergocalciferol, (DRISDOL) 50000 UNITS CAPS Take 50,000 Units by mouth every 7 (seven) days. Takes on Thursday   Yes [provider]  XARELTO 15 MG TABS tablet Take 15 mg by mouth daily with supper.  06/27/16  Yes [provider]  BD PEN NEEDLE NANO U/F 32G X 4 MM MISC Inject 1 each as directed See admin instructions. Use pen needles with insulin pens daily 12/24/14   [provider]  clindamycin (CLEOCIN) 300 MG capsule Take 300 mg by mouth once.  03/02/16   [provider]  Lidocaine 0.5 % GEL Apply 4 g topically 3 (three) times daily. Patient not taking: Reported on 06/03/2018 07/29/16   Marcial Pacas, MD    Family History Family History  Problem Relation Age of Onset  . Stroke Father   .  Hypertension Father   . Atrial fibrillation Father        HAD MURMUR  . Heart failure Mother   . Congestive Heart Failure Mother   . Hypertension Mother   . Hypertension Brother   . Hypertension Sister   . Hypertension Son   . CAD Sister        EARLY  . CAD Brother        EARLY    Social History Social History   Tobacco Use  . Smoking status: Never Smoker  . Smokeless tobacco: Never Used  Substance Use Topics  . Alcohol use: No  . Drug use: No     Allergies   Albuterol; Epinephrine; Penicillins; Torsemide; Bee venom; and Ivp dye [iodinated diagnostic agents]  Review of Systems Review of Systems  All systems reviewed and negative, other than as noted in HPI.  Physical Exam Updated Vital Signs BP 115/70 (BP Location: Right Arm)   Pulse 94   Temp 98.1 F (36.7 C) (Oral)   Resp 14   Ht 5' 8.5" (1.74 m)   Wt 121.1 kg   SpO2 96%   BMI 40.01 kg/m   Physical Exam Vitals signs and nursing note reviewed.  Constitutional:      General: She is not in acute distress.    Appearance: She is well-developed.     Comments: Laying in bed.  Appears tired, nontoxic.  Obese.  HENT:     Head: Normocephalic and atraumatic.  Eyes:     General:        Right eye: No discharge.        Left eye: No discharge.     Conjunctiva/sclera: Conjunctivae normal.  Neck:     Musculoskeletal: Neck supple.  Cardiovascular:     Rate and Rhythm: Normal rate.     Heart sounds: Normal heart sounds. No murmur. No friction rub. No gallop.      Comments: Irregularly irregular Pulmonary:     Effort: Pulmonary effort is normal. No respiratory distress.     Breath sounds: Normal breath sounds.  Abdominal:     General: There is no distension.     Palpations: Abdomen is soft.     Tenderness: There is no abdominal tenderness.  Musculoskeletal:     Comments: Left foot splinted.  Wearing compression hose on right.  Mild edema.  Skin:    General: Skin is warm and dry.  Neurological:     Mental  Status: She is alert.  Psychiatric:        Behavior: Behavior normal.        Thought Content: Thought content normal.      ED Treatments / Results  Labs (all labs ordered are listed, but only abnormal results are displayed) Labs Reviewed  URINE CULTURE - Abnormal; Notable for the following components:      Result Value   Culture >=100,000 COLONIES/mL ESCHERICHIA COLI (*)    Organism ID, Bacteria ESCHERICHIA COLI (*)    All other components within normal limits  CBC WITH DIFFERENTIAL/PLATELET - Abnormal; Notable for the following components:   Hemoglobin 11.6 (*)    MCHC 29.7 (*)    RDW 16.6 (*)    All other components within normal limits  BASIC METABOLIC PANEL - Abnormal; Notable for the following components:   Potassium 3.2 (*)    Glucose, Bld 218 (*)    BUN Kristine (*)    Creatinine, Ser 2.29 (*)    GFR calc non Af Amer 21 (*)    GFR calc Af Amer 24 (*)    All other components within normal limits  BRAIN NATRIURETIC PEPTIDE - Abnormal; Notable for the following components:   B Natriuretic Peptide 198.3 (*)    All other components within normal limits  URINALYSIS, ROUTINE W REFLEX MICROSCOPIC - Abnormal; Notable for the following components:   Nitrite POSITIVE (*)    Leukocytes,Ua TRACE (*)    Bacteria, UA RARE (*)    All other components within normal limits  CBC WITH DIFFERENTIAL/PLATELET - Abnormal; Notable for the following components:   Hemoglobin 11.1 (*)    RDW 16.6 (*)    All other components within normal limits  HEMOGLOBIN A1C - Abnormal; Notable for the following components:   Hgb A1c MFr  Bld 7.2 (*)    All other components within normal limits  GLUCOSE, CAPILLARY - Abnormal; Notable for the following components:   Glucose-Capillary 180 (*)    All other components within normal limits  BASIC METABOLIC PANEL - Abnormal; Notable for the following components:   Potassium 3.0 (*)    Glucose, Bld 138 (*)    BUN 59 (*)    Creatinine, Ser 2.12 (*)    Calcium 8.8  (*)    GFR calc non Af Amer 23 (*)    GFR calc Af Amer 26 (*)    All other components within normal limits  LIPID PANEL - Abnormal; Notable for the following components:   HDL 25 (*)    All other components within normal limits  GLUCOSE, CAPILLARY - Abnormal; Notable for the following components:   Glucose-Capillary 150 (*)    All other components within normal limits  GLUCOSE, CAPILLARY - Abnormal; Notable for the following components:   Glucose-Capillary 122 (*)    All other components within normal limits  GLUCOSE, CAPILLARY - Abnormal; Notable for the following components:   Glucose-Capillary 191 (*)    All other components within normal limits  GLUCOSE, CAPILLARY - Abnormal; Notable for the following components:   Glucose-Capillary 140 (*)    All other components within normal limits  BASIC METABOLIC PANEL - Abnormal; Notable for the following components:   Glucose, Bld 162 (*)    BUN 57 (*)    Creatinine, Ser 2.09 (*)    GFR calc non Af Amer 23 (*)    GFR calc Af Amer 27 (*)    All other components within normal limits  GLUCOSE, CAPILLARY - Abnormal; Notable for the following components:   Glucose-Capillary 210 (*)    All other components within normal limits  GLUCOSE, CAPILLARY - Abnormal; Notable for the following components:   Glucose-Capillary 175 (*)    All other components within normal limits  GLUCOSE, CAPILLARY - Abnormal; Notable for the following components:   Glucose-Capillary 199 (*)    All other components within normal limits  GLUCOSE, CAPILLARY - Abnormal; Notable for the following components:   Glucose-Capillary 104 (*)    All other components within normal limits  BASIC METABOLIC PANEL - Abnormal; Notable for the following components:   Potassium 3.0 (*)    Glucose, Bld 130 (*)    BUN 50 (*)    Creatinine, Ser 2.10 (*)    Calcium 8.8 (*)    GFR calc non Af Amer 23 (*)    GFR calc Af Amer 27 (*)    All other components within normal limits   GLUCOSE, CAPILLARY - Abnormal; Notable for the following components:   Glucose-Capillary 56 (*)    All other components within normal limits  GLUCOSE, CAPILLARY - Abnormal; Notable for the following components:   Glucose-Capillary 177 (*)    All other components within normal limits  GLUCOSE, CAPILLARY - Abnormal; Notable for the following components:   Glucose-Capillary 173 (*)    All other components within normal limits  RENAL FUNCTION PANEL - Abnormal; Notable for the following components:   Glucose, Bld 155 (*)    BUN 45 (*)    Creatinine, Ser 1.96 (*)    Calcium 8.8 (*)    Albumin 3.1 (*)    GFR calc non Af Amer 25 (*)    GFR calc Af Amer 29 (*)    All other components within normal limits  GLUCOSE, CAPILLARY - Abnormal; Notable  for the following components:   Glucose-Capillary 184 (*)    All other components within normal limits  GLUCOSE, CAPILLARY - Abnormal; Notable for the following components:   Glucose-Capillary 140 (*)    All other components within normal limits  GLUCOSE, CAPILLARY - Abnormal; Notable for the following components:   Glucose-Capillary 155 (*)    All other components within normal limits  GLUCOSE, CAPILLARY - Abnormal; Notable for the following components:   Glucose-Capillary 134 (*)    All other components within normal limits  RENAL FUNCTION PANEL - Abnormal; Notable for the following components:   Potassium 3.2 (*)    BUN 38 (*)    Creatinine, Ser 1.92 (*)    Calcium 8.5 (*)    Albumin 3.1 (*)    GFR calc non Af Amer 26 (*)    GFR calc Af Amer 30 (*)    All other components within normal limits  GLUCOSE, CAPILLARY - Abnormal; Notable for the following components:   Glucose-Capillary 171 (*)    All other components within normal limits  GLUCOSE, CAPILLARY - Abnormal; Notable for the following components:   Glucose-Capillary 124 (*)    All other components within normal limits  GLUCOSE, CAPILLARY - Abnormal; Notable for the following  components:   Glucose-Capillary 143 (*)    All other components within normal limits  BASIC METABOLIC PANEL - Abnormal; Notable for the following components:   Glucose, Bld 176 (*)    BUN 37 (*)    Creatinine, Ser 2.05 (*)    GFR calc non Af Amer 24 (*)    GFR calc Af Amer 28 (*)    All other components within normal limits  GLUCOSE, CAPILLARY - Abnormal; Notable for the following components:   Glucose-Capillary 176 (*)    All other components within normal limits  GLUCOSE, CAPILLARY - Abnormal; Notable for the following components:   Glucose-Capillary 146 (*)    All other components within normal limits  CBG MONITORING, ED - Abnormal; Notable for the following components:   Glucose-Capillary 174 (*)    All other components within normal limits  TROPONIN I  INFLUENZA PANEL BY PCR (TYPE A & B)  MAGNESIUM  GLUCOSE, CAPILLARY  GLUCOSE, CAPILLARY  GLUCOSE, CAPILLARY  GLUCOSE, CAPILLARY    EKG EKG Interpretation  Date/Time:  Friday June 03 2018 10:31:02 EDT Ventricular Rate:  96 PR Interval:    QRS Duration: 105 QT Interval:  394 QTC Calculation: 498 R Axis:   88 Text Interpretation:  Sinus rhythm Atrial premature complex Borderline right axis deviation Low voltage, precordial leads Borderline prolonged QT interval Confirmed by Virgel Manifold [phone]) on 06/03/2018 10:41:18 AM   Radiology Dg Chest 2 View  Result Date: 06/03/2018 CLINICAL DATA:  Shortness of breath EXAM: CHEST - 2 VIEW COMPARISON:  08/21/2017 and prior radiographs FINDINGS: Cardiomegaly with pulmonary vascular congestion noted. Mild interstitial opacities are identified and may represent mild interstitial edema. No pleural effusion or pneumothorax. No acute bony abnormality identified. IMPRESSION: Cardiomegaly with pulmonary vascular congestion. Mild interstitial opacities are nonspecific but may represent mild interstitial edema. Electronically Signed   By: Margarette Canada M.D.   On: 06/03/2018 12:00     Procedures Procedures (including critical care time)  Medications Ordered in ED Medications - No data to display   Initial Impression / Assessment and Plan / ED Course  I have reviewed the triage vital signs and the nursing notes.  Pertinent labs & imaging results that were available during my care  of the patient were reviewed by me and considered in my medical decision making (see chart for details).    73 year old [name] with hypoxemia.  I suspect this is related to heart failure.  She describes productive cough occasionally pinkish leg is may potentially have blood in it.  Chest x-ray with no focal infiltrate.  Consider PE particularly with her foot chronically splinted, but she is on Xarelto making this a lot less likely.  She is in A. fib but has a history of this.  Her rate is reasonable.  She denies any chest pain.  Normal.  Flu is negative. Does have a UTI.   Final Clinical Impressions(s) / ED Diagnoses   Final diagnoses:  Acute on chronic congestive heart failure, unspecified heart failure type [facility])  Acute cystitis without hematuria    ED Discharge Orders    None       Virgel Manifold, MD 06/11/18 1941

## 2018-06-03 NOTE — ED Notes (Signed)
Please call pt husband with any updates or changes  Jr. [phone]

## 2018-06-04 ENCOUNTER — Inpatient Hospital Stay (HOSPITAL_COMMUNITY): Payer: Medicare HMO

## 2018-06-04 ENCOUNTER — Other Ambulatory Visit (HOSPITAL_COMMUNITY): Payer: Medicare HMO

## 2018-06-04 DIAGNOSIS — I5023 Acute on chronic systolic (congestive) heart failure: Secondary | ICD-10-CM | POA: Diagnosis not present

## 2018-06-04 DIAGNOSIS — F419 Anxiety disorder, unspecified: Secondary | ICD-10-CM | POA: Diagnosis present

## 2018-06-04 DIAGNOSIS — I130 Hypertensive heart and chronic kidney disease with heart failure and stage 1 through stage 4 chronic kidney disease, or unspecified chronic kidney disease: Secondary | ICD-10-CM | POA: Diagnosis not present

## 2018-06-04 DIAGNOSIS — E785 Hyperlipidemia, unspecified: Secondary | ICD-10-CM | POA: Diagnosis present

## 2018-06-04 DIAGNOSIS — I509 Heart failure, unspecified: Secondary | ICD-10-CM

## 2018-06-04 DIAGNOSIS — Z66 Do not resuscitate: Secondary | ICD-10-CM | POA: Diagnosis not present

## 2018-06-04 DIAGNOSIS — I340 Nonrheumatic mitral (valve) insufficiency: Secondary | ICD-10-CM

## 2018-06-04 DIAGNOSIS — M810 Age-related osteoporosis without current pathological fracture: Secondary | ICD-10-CM | POA: Diagnosis present

## 2018-06-04 DIAGNOSIS — E1142 Type 2 diabetes mellitus with diabetic polyneuropathy: Secondary | ICD-10-CM | POA: Diagnosis not present

## 2018-06-04 DIAGNOSIS — N3000 Acute cystitis without hematuria: Secondary | ICD-10-CM | POA: Diagnosis not present

## 2018-06-04 DIAGNOSIS — Z6841 Body Mass Index (BMI) 40.0 and over, adult: Secondary | ICD-10-CM | POA: Diagnosis not present

## 2018-06-04 DIAGNOSIS — G4733 Obstructive sleep apnea (adult) (pediatric): Secondary | ICD-10-CM | POA: Diagnosis present

## 2018-06-04 DIAGNOSIS — E876 Hypokalemia: Secondary | ICD-10-CM | POA: Diagnosis present

## 2018-06-04 DIAGNOSIS — J45909 Unspecified asthma, uncomplicated: Secondary | ICD-10-CM | POA: Diagnosis present

## 2018-06-04 DIAGNOSIS — I4819 Other persistent atrial fibrillation: Secondary | ICD-10-CM | POA: Diagnosis not present

## 2018-06-04 DIAGNOSIS — M419 Scoliosis, unspecified: Secondary | ICD-10-CM | POA: Diagnosis present

## 2018-06-04 DIAGNOSIS — B9620 Unspecified Escherichia coli [E. coli] as the cause of diseases classified elsewhere: Secondary | ICD-10-CM | POA: Diagnosis present

## 2018-06-04 DIAGNOSIS — J9601 Acute respiratory failure with hypoxia: Secondary | ICD-10-CM | POA: Diagnosis not present

## 2018-06-04 DIAGNOSIS — I361 Nonrheumatic tricuspid (valve) insufficiency: Secondary | ICD-10-CM | POA: Diagnosis not present

## 2018-06-04 DIAGNOSIS — E1122 Type 2 diabetes mellitus with diabetic chronic kidney disease: Secondary | ICD-10-CM | POA: Diagnosis not present

## 2018-06-04 DIAGNOSIS — Z794 Long term (current) use of insulin: Secondary | ICD-10-CM

## 2018-06-04 DIAGNOSIS — K219 Gastro-esophageal reflux disease without esophagitis: Secondary | ICD-10-CM | POA: Diagnosis present

## 2018-06-04 DIAGNOSIS — R69 Illness, unspecified: Secondary | ICD-10-CM | POA: Diagnosis not present

## 2018-06-04 DIAGNOSIS — E559 Vitamin D deficiency, unspecified: Secondary | ICD-10-CM | POA: Diagnosis present

## 2018-06-04 DIAGNOSIS — Z1623 Resistance to quinolones and fluoroquinolones: Secondary | ICD-10-CM | POA: Diagnosis not present

## 2018-06-04 DIAGNOSIS — N184 Chronic kidney disease, stage 4 (severe): Secondary | ICD-10-CM

## 2018-06-04 DIAGNOSIS — E11610 Type 2 diabetes mellitus with diabetic neuropathic arthropathy: Secondary | ICD-10-CM | POA: Diagnosis present

## 2018-06-04 DIAGNOSIS — J42 Unspecified chronic bronchitis: Secondary | ICD-10-CM | POA: Diagnosis present

## 2018-06-04 LAB — BASIC METABOLIC PANEL
Anion gap: 12 (ref 5–15)
BUN: 59 mg/dL — ABNORMAL HIGH (ref 8–23)
CHLORIDE: 101 mmol/L (ref 98–111)
CO2: 29 mmol/L (ref 22–32)
CREATININE: 2.12 mg/dL — AB (ref 0.44–1.00)
Calcium: 8.8 mg/dL — ABNORMAL LOW (ref 8.9–10.3)
GFR calc Af Amer: 26 mL/min — ABNORMAL LOW (ref >60)
GFR calc non Af Amer: 23 mL/min — ABNORMAL LOW (ref >60)
Glucose, Bld: 138 mg/dL — ABNORMAL HIGH (ref 70–99)
POTASSIUM: 3 mmol/L — AB (ref 3.5–5.1)
Sodium: 142 mmol/L (ref 135–145)

## 2018-06-04 LAB — CBC WITH DIFFERENTIAL/PLATELET
ABS IMMATURE GRANULOCYTES: 0.03 10*3/uL (ref 0.00–0.07)
Basophils Absolute: 0.1 10*3/uL (ref 0.0–0.1)
Basophils Relative: 1 %
Eosinophils Absolute: 0.4 10*3/uL (ref 0.0–0.5)
Eosinophils Relative: 6 %
HCT: 36.3 % (ref 36.0–46.0)
Hemoglobin: 11.1 g/dL — ABNORMAL LOW (ref 12.0–15.0)
Immature Granulocytes: 0 %
Lymphocytes Relative: 25 %
Lymphs Abs: 1.8 10*3/uL (ref 0.7–4.0)
MCH: 27.6 pg (ref 26.0–34.0)
MCHC: 30.6 g/dL (ref 30.0–36.0)
MCV: 90.3 fL (ref 80.0–100.0)
MONO ABS: 0.7 10*3/uL (ref 0.1–1.0)
Monocytes Relative: 9 %
Neutro Abs: 4.2 10*3/uL (ref 1.7–7.7)
Neutrophils Relative %: 59 %
Platelets: 170 10*3/uL (ref 150–400)
RBC: 4.02 MIL/uL (ref 3.87–5.11)
RDW: 16.6 % — ABNORMAL HIGH (ref 11.5–15.5)
WBC: 7.1 10*3/uL (ref 4.0–10.5)
nRBC: 0 % (ref 0.0–0.2)

## 2018-06-04 LAB — LIPID PANEL
Cholesterol: 123 mg/dL (ref 0–200)
HDL: 25 mg/dL — ABNORMAL LOW (ref >40)
LDL Cholesterol: 77 mg/dL (ref 0–99)
Total CHOL/HDL Ratio: 4.9 RATIO
Triglycerides: 105 mg/dL (ref <150)
VLDL: 21 mg/dL (ref 0–40)

## 2018-06-04 LAB — GLUCOSE, CAPILLARY
GLUCOSE-CAPILLARY: 210 mg/dL — AB (ref 70–99)
Glucose-Capillary: 122 mg/dL — ABNORMAL HIGH (ref 70–99)
Glucose-Capillary: 191 mg/dL — ABNORMAL HIGH (ref 70–99)
Glucose-Capillary: 140 mg/dL — ABNORMAL HIGH (ref 70–99)

## 2018-06-04 LAB — MAGNESIUM: Magnesium: 2.3 mg/dL (ref 1.7–2.4)

## 2018-06-04 MED ORDER — TRAZODONE HCL 50 MG PO TABS
50.0000 mg | ORAL_TABLET | Freq: Every evening | ORAL | Status: DC | PRN
  Administered 2018-06-04 – 2018-06-08 (×4): 50 mg via ORAL
  Filled 2018-06-04 (×5): qty 1

## 2018-06-04 MED ORDER — POTASSIUM CHLORIDE CRYS ER 20 MEQ PO TBCR
40.0000 meq | EXTENDED_RELEASE_TABLET | Freq: Once | ORAL | Status: AC
  Administered 2018-06-04: 40 meq via ORAL
  Filled 2018-06-04: qty 2

## 2018-06-04 MED ORDER — DOCUSATE SODIUM 100 MG PO CAPS
200.0000 mg | ORAL_CAPSULE | Freq: Two times a day (BID) | ORAL | Status: DC
  Administered 2018-06-04 – 2018-06-09 (×10): 200 mg via ORAL
  Filled 2018-06-04 (×10): qty 2

## 2018-06-04 NOTE — Progress Notes (Signed)
Provided pt with collection hat, placedin toilet. Educated pt on measuring intake and output, pt aware and understanding

## 2018-06-04 NOTE — Progress Notes (Signed)
Report called to [place], spoke to [place]  Pt transferred via bed with all belongings including ortho boot  Pt stated she will call her husband to inform him

## 2018-06-04 NOTE — Progress Notes (Signed)
  Echocardiogram 2D Echocardiogram has been performed.  Kristine [name] 06/04/2018, 3:53 PM

## 2018-06-04 NOTE — Evaluation (Signed)
Occupational Therapy Evaluation Patient Details Name: Kristine [name] MRN: [id number] DOB: 1945-12-12 Today's Date: 06/04/2018    History of Present Illness 73 y.o. female with medical history significant of DM; afib on Xarelto; morbid obesity; HTN; HLD; Charcot foot; and CHF presenting with SOB. Recently left foot sx and was receiving HH OT/PT. On 06/03/18, pt SpO2 dropping to 79% persistently with therapy and EMS was called.    Clinical Impression   PTA, pt was living with her husband who assisted with LB ADLs. Pt was receiving HHOT/PT after LLE sx. Pt currently requiring Min-Max A for LB ADLs and Min Guard-Min A for functional mobility with RW. Pt maintaining WB status. Spo2 dropping to 84% on RA; placing back on 2L and cueing for purse lip breathing. Pt would benefit from further acute OT to facilitate safe dc. Recommend dc to home with HHOT for further OT to optimize safety, independence with ADLs, and return to PLOF.      Follow Up Recommendations  Home health OT;Supervision/Assistance - 24 hour(Requesting [facility] if possible. May need O2)    Equipment Recommendations  None recommended by OT    Recommendations for Other Services PT consult     Precautions / Restrictions Precautions Precautions: Fall;Other (comment)(O2) Precaution Comments: Watch O2 Required Braces or Orthoses: Other Brace Other Brace: Left boot Restrictions Weight Bearing Restrictions: Yes LLE Weight Bearing: Partial weight bearing LLE Partial Weight Bearing Percentage or Pounds: 50% Other Position/Activity Restrictions: Wear boot      Mobility Bed Mobility               General bed mobility comments: Sitting at at EOB upon arrival  Transfers Overall transfer level: Needs assistance Equipment used: Rolling walker (2 wheeled) Transfers: Sit to/from Stand Sit to Stand: Min assist;Min guard         General transfer comment: Min A for initial power up. Min Guard A for higher  surface    Balance Overall balance assessment: Needs assistance Sitting-balance support: No upper extremity supported;Feet supported Sitting balance-Leahy Scale: Fair     Standing balance support: Bilateral upper extremity supported;During functional activity Standing balance-Leahy Scale: Poor Standing balance comment: Reliant on Ue support                           ADL either performed or assessed with clinical judgement   ADL Overall ADL's : Needs assistance/impaired Eating/Feeding: Independent   Grooming: Set up;Supervision/safety;Sitting;Brushing hair;Oral care Grooming Details (indicate cue type and reason): Supervision for safety while sitting at sink to perform grooming.  Upper Body Bathing: Supervision/ safety;Sitting   Lower Body Bathing: Minimal assistance;Sit to/from stand   Upper Body Dressing : Supervision/safety;Sitting   Lower Body Dressing: Maximal assistance;Sit to/from stand Lower Body Dressing Details (indicate cue type and reason): Max A to don boot and shoe. husband Cabin crew: Minimal assistance;Moderate assistance;Ambulation;RW;Regular Glass blower/designer Details (indicate cue type and reason): Min A for safe descent and then Mod A for power up from lower toilet. Toileting- Clothing Manipulation and Hygiene: Minimal assistance;Sit to/from stand Toileting - Clothing Manipulation Details (indicate cue type and reason): Min A for managing gown     Functional mobility during ADLs: Min guard;Rolling walker General ADL Comments: Pt SpO2 dropping to 84% on RA. Discussed possible need for O2.      Vision         Perception     Praxis      Pertinent Vitals/Pain Pain  Assessment: Faces Faces Pain Scale: No hurt Pain Intervention(s): Monitored during session     Hand Dominance Right   Extremity/Trunk Assessment Upper Extremity Assessment Upper Extremity Assessment: Overall WFL for tasks assessed   Lower Extremity  Assessment Lower Extremity Assessment: Defer to PT evaluation   Cervical / Trunk Assessment Cervical / Trunk Assessment: Other exceptions Cervical / Trunk Exceptions: Increased body habitus   Communication Communication Communication: No difficulties   Cognition Arousal/Alertness: Awake/alert Behavior During Therapy: WFL for tasks assessed/performed Overall Cognitive Status: Impaired/Different from baseline Area of Impairment: Problem solving                             Problem Solving: Slow processing General Comments: Pt requiring increased time for processing during session. Husband reports that she is slower than normal (but possibly due to neuropathy medication).    General Comments  Husband present throughout. SpO2 dropping to 84% on RA. Placing back on 2L and SpO2 >90%    Exercises     Shoulder Instructions      Home Living Family/patient expects to be discharged to:: Private residence Living Arrangements: Spouse/significant other Available Help at Discharge: Family;Available PRN/intermittently Type of Home: House Home Access: Ramped entrance     Home Layout: Two level     Bathroom Shower/Tub: Occupational psychologist: Handicapped height     Home Equipment: Walker - standard;Shower seat;Wheelchair Copywriter, advertising)          Prior Functioning/Environment Level of Independence: Needs assistance  Gait / Transfers Assistance Needed: Used Standard walker ADL's / Homemaking Assistance Needed: Husband assists with LB ADLs            OT Problem List: Decreased strength;Decreased activity tolerance;Impaired balance (sitting and/or standing);Decreased knowledge of precautions;Cardiopulmonary status limiting activity      OT Treatment/Interventions: Self-care/ADL training;Therapeutic exercise;DME and/or AE instruction;Energy conservation;Therapeutic activities;Patient/family education    OT Goals(Current goals can be found in the care  plan section) Acute Rehab OT Goals Patient Stated Goal: "I really don't want to go home with oxygen" OT Goal Formulation: With patient/family Time For Goal Achievement: 06/18/18 Potential to Achieve Goals: Good  OT Frequency: Min 2X/week   Barriers to D/C:            Co-evaluation              AM-PAC OT "6 Clicks" Daily Activity     Outcome Measure Help from another person eating meals?: None Help from another person taking care of personal grooming?: A Little Help from another person toileting, which includes using toliet, bedpan, or urinal?: A Little Help from another person bathing (including washing, rinsing, drying)?: A Little Help from another person to put on and taking off regular upper body clothing?: None Help from another person to put on and taking off regular lower body clothing?: A Lot 6 Click Score: 19   End of Session Equipment Utilized During Treatment: Gait belt;Rolling walker;Oxygen Nurse Communication: Mobility status;Weight bearing status  Activity Tolerance: Patient tolerated treatment well Patient left: in chair;with call bell/phone within reach;with family/visitor present  OT Visit Diagnosis: Unsteadiness on feet (R26.81);Other abnormalities of gait and mobility (R26.89);Muscle weakness (generalized) (M62.81)                Time: 2119-4174 OT Time Calculation (min): 37 min Charges:  OT General Charges $OT Visit: 1 Visit OT Evaluation $OT Eval Low Complexity: 1 Low OT Treatments $Self Care/Home Management : 8-22  mins  Kilyn Maragh MSOT, OTR/L Acute Rehab Pager: [phone] Office: [place] 06/04/2018, 8:55 AM

## 2018-06-04 NOTE — Progress Notes (Addendum)
Progress Note    Kristine [name]  TOI:[id number] DOB: 1945-07-20  DOA: 06/03/2018 PCP: Prince Solian, MD    Brief Narrative:     Medical records reviewed and are as summarized below:  Kristine [name] is an 73 y.o. female with medical history significant of DM; afib on Xarelto; morbid obesity; HTN; HLD; Charcot foot; and CHF presenting with SOB.  She has been having PT/OT at home.  Each time they came this week, her O2 was low.  Today, it was 79% persistently.  She has been SOB with therapy.  She has had orthopnea.  +LE edema despite "high doses" of Lasix - Dr. Joelyn Oms decreased it last week because her potassium was low.   Assessment/Plan:   Principal Problem:   Acute on chronic systolic CHF (congestive heart failure) (HCC) Active Problems:   Diabetes mellitus, type 2 (HCC)   High cholesterol   Atrial fibrillation (HCC)   CKD (chronic kidney disease) stage 4, GFR 15-29 ml/min (HCC)  Acute respiratory failure -O2 sats down to the 70s/80s while walking -recheck echo with bubble study, ? Shunt -was prior on amiodarone and had PFTs/high resolution CT scan and was seen by pulmonary-- weaned off amiodarone-- though multifactorial at the time-- obesity/deconditioning etc -doubt PE as on xarelto and is compliant  Acute on chronic systolic CHF -Patient with known CHF presenting with worsening SOB and hypoxia  -CXR with vascular congestion and possible mild pulmonary edema -Elevated BNP but improved from prior -12/23/17 echo with EF 40-45% -Will start ASA -No ACE due to CKD -Continue Toprol XL -lasix 60 mg q8h-- I/Os have not been kept overnight   HTN -Continue Toprol XL -Hold Norvasc due to risk of edema  HLD -Will start empiric Lipitor 20 mg daily -Lipids were checked in 5/19 and LDL was 91; will repeat  DM -Last A1c was 9.4, but now down to 7.2 -Continue Tresiba -SSI  Presumed UTI -cipro ordered in ER for 3 days- stop date of Sunday  Afib - rate  control -Continue Xarelto  Hypokalemia -will need close monitoring while getting IV lasix  Stage IV CKD -Appears to be stable at this time -She does not appear to be a dialysis candidate -Will follow with more aggressive diuresis  obesity Body mass index is 42.04 kg/m.   Family Communication/Anticipated D/C date and plan/Code Status   DVT prophylaxis: xarelto Code Status: dnr  Family Communication: husband at bedside Disposition Plan: needs to remain in hospital for IV lasix, frequent lab checks and aggressive re-pleating of low K, as well as weaning off of oxygen   Medical Consultants:    None.    Subjective:   Got very short of breath with walking  Objective:    Vitals:   06/03/18 1822 06/03/18 2329 06/04/18 0346 06/04/18 0911  BP: 111/67 113/69 110/65 (!) 112/59  Pulse: 83 92 71   Resp: 19 18 18    Temp: 98.1 F (36.7 C) 98 F (36.7 C) 97.9 F (36.6 C)   TempSrc: Oral Oral Oral   SpO2: 94% 94% 96%   Weight:      Height:        Intake/Output Summary (Last 24 hours) at 06/04/2018 1026 Last data filed at 06/04/2018 0400 Gross per 24 hour  Intake 347.02 ml  Output --  Net 347.02 ml   Filed Weights   06/03/18 1036 06/03/18 1555  Weight: 121.1 kg 125.4 kg    Exam: In chair, obese white female Few crackles but no  wheezing Breathing appears mildly labored Boot on left foot No right leg edema  No rashes or lesions   Data Reviewed:   I have personally reviewed following labs and imaging studies:  Labs: Labs show the following:   Basic Metabolic Panel: Recent Labs  Lab 06/03/18 1117 06/04/18 0535  NA 140 142  K 3.2* 3.0*  CL 99 101  CO2 28 29  GLUCOSE 218* 138*  BUN 66* 59*  CREATININE 2.29* 2.12*  CALCIUM 9.3 8.8*  MG  --  2.3   GFR Estimated Creatinine Clearance: 34 mL/min (A) (by C-G formula based on SCr of 2.12 mg/dL (H)). Liver Function Tests: No results for input(s): AST, ALT, ALKPHOS, BILITOT, PROT, ALBUMIN in the last  168 hours. No results for input(s): LIPASE, AMYLASE in the last 168 hours. No results for input(s): AMMONIA in the last 168 hours. Coagulation profile No results for input(s): INR, PROTIME in the last 168 hours.  CBC: Recent Labs  Lab 06/03/18 1117 06/04/18 0535  WBC 7.5 7.1  NEUTROABS 4.5 4.2  HGB 11.6* 11.1*  HCT 39.0 36.3  MCV 90.7 90.3  PLT 180 170   Cardiac Enzymes: Recent Labs  Lab 06/03/18 1117  TROPONINI <0.03   BNP (last 3 results) No results for input(s): PROBNP in the last 8760 hours. CBG: Recent Labs  Lab 06/03/18 1328 06/03/18 1601 06/03/18 2117 06/04/18 0554  GLUCAP 174* 180* 150* 122*   D-Dimer: No results for input(s): DDIMER in the last 72 hours. Hgb A1c: Recent Labs    06/03/18 1604  HGBA1C 7.2*   Lipid Profile: Recent Labs    06/04/18 0535  CHOL 123  HDL 25*  LDLCALC 77  TRIG 105  CHOLHDL 4.9   Thyroid function studies: No results for input(s): TSH, T4TOTAL, T3FREE, THYROIDAB in the last 72 hours.  Invalid input(s): FREET3 Anemia work up: No results for input(s): VITAMINB12, FOLATE, FERRITIN, TIBC, IRON, RETICCTPCT in the last 72 hours. Sepsis Labs: Recent Labs  Lab 06/03/18 1117 06/04/18 0535  WBC 7.5 7.1    Microbiology No results found for this or any previous visit (from the past 240 hour(s)).  Procedures and diagnostic studies:  Dg Chest 2 View  Result Date: 06/03/2018 CLINICAL DATA:  Shortness of breath EXAM: CHEST - 2 VIEW COMPARISON:  08/21/2017 and prior radiographs FINDINGS: Cardiomegaly with pulmonary vascular congestion noted. Mild interstitial opacities are identified and may represent mild interstitial edema. No pleural effusion or pneumothorax. No acute bony abnormality identified. IMPRESSION: Cardiomegaly with pulmonary vascular congestion. Mild interstitial opacities are nonspecific but may represent mild interstitial edema. Electronically Signed   By: Margarette Canada M.D.   On: 06/03/2018 12:00     Medications:    aspirin EC  81 mg Oral Daily   atorvastatin  20 mg Oral q1800   febuxostat  80 mg Oral Daily   furosemide  60 mg Intravenous Q8H   insulin aspart  0-15 Units Subcutaneous TID WC   insulin aspart  0-5 Units Subcutaneous QHS   insulin glargine  96 Units Subcutaneous BH-q7a   levothyroxine  50 mcg Oral Q0600   metoprolol succinate  50 mg Oral BID   potassium chloride SA  20 mEq Oral BID   pregabalin  150 mg Oral QHS   pregabalin  50 mg Oral Daily   pregabalin  50 mg Oral Q24H   Rivaroxaban  15 mg Oral Q supper   sodium chloride flush  3 mL Intravenous Q12H   Continuous Infusions:  sodium  chloride     ciprofloxacin 200 mg (06/04/18 0930)     LOS: 0 days   Geradine Girt  [facility]   How to contact the [facility] Attending or Consulting provider [place] or covering provider during after hours [place], for this patient?  1. Check the care team in [facility] and look for a) attending/consulting [facility] provider listed and b) the [facility] team listed 2. Log into [email or web address] and use [place]'s universal password to access. If you do not have the password, please contact the hospital operator. 3. Locate the [facility] provider you are looking for under [facility] and page to a number that you can be directly reached. 4. If you still have difficulty reaching the provider, please page the [facility] (Director on Call) for the Hospitalists listed on amion for assistance.  06/04/2018, 10:26 AM

## 2018-06-04 NOTE — Evaluation (Signed)
Physical Therapy Evaluation Patient Details Name: Kristine [name] MRN: [id number] DOB: 1945/06/26 Today's Date: 06/04/2018   History of Present Illness  73 y.o. female with medical history significant of DM; afib on Xarelto; morbid obesity; HTN; HLD; Charcot foot; and CHF presenting with SOB. Recently left foot sx and was receiving HH OT/PT. On 06/03/18, pt SpO2 dropping to 79% persistently with therapy and EMS was called.   Clinical Impression  Pt admitted with above. Pt functioning near baseline with exception of SPO2 decreasing to 78% on RA during ambulation. Pt with SOB during/after activity. Pt is concerned about going home with O2 discussed with Dr. Eliseo Squires and RN. Acute PT to cont to follow.    Follow Up Recommendations Home health PT;Supervision/Assistance - 24 hour(continue with previous set up)    Equipment Recommendations  None recommended by PT    Recommendations for Other Services       Precautions / Restrictions Precautions Precautions: Fall;Other (comment)(O2) Precaution Comments: Watch O2 Required Braces or Orthoses: Other Brace Other Brace: Left boot, R foot elevator to match L foot Restrictions Weight Bearing Restrictions: Yes LLE Weight Bearing: Partial weight bearing LLE Partial Weight Bearing Percentage or Pounds: 50% Other Position/Activity Restrictions: Wear boot      Mobility  Bed Mobility               General bed mobility comments: sitting up in chair upon PT arrival  Transfers Overall transfer level: Needs assistance Equipment used: Rolling walker (2 wheeled) Transfers: Sit to/from Stand Sit to Stand: Min guard         General transfer comment: increased time but good technique  Ambulation/Gait Ambulation/Gait assistance: Min guard Gait Distance (Feet): 175 Feet Assistive device: Rolling walker (2 wheeled) Gait Pattern/deviations: Decreased stride length;Step-through pattern Gait velocity: dec Gait velocity interpretation: <1.31  ft/sec, indicative of household ambulator General Gait Details: pt steady despite fearful of rolling walker, pt with SOB SpO2 dec to 78-82% on RA. Inc to 84-85% once on 2LO2 via . Pt reports fatigue after sitting down but no during ambulation  with c/o of "my arms hurt" because I can only put down 50% of my weight on the L LE  Stairs            Wheelchair Mobility    Modified Rankin (Stroke Patients Only)       Balance Overall balance assessment: Needs assistance Sitting-balance support: No upper extremity supported;Feet supported Sitting balance-Leahy Scale: Fair     Standing balance support: Bilateral upper extremity supported;During functional activity Standing balance-Leahy Scale: Poor Standing balance comment: Reliant on Ue support                             Pertinent Vitals/Pain Pain Assessment: Faces Faces Pain Scale: No hurt Pain Intervention(s): Monitored during session    Home Living Family/patient expects to be discharged to:: Private residence Living Arrangements: Spouse/significant other Available Help at Discharge: Family;Available PRN/intermittently Type of Home: House Home Access: Ramped entrance     Home Layout: Two level Home Equipment: Walker - standard;Shower seat;Wheelchair Copywriter, advertising)      Prior Function Level of Independence: Needs assistance   Gait / Transfers Assistance Needed: Used Standard walker  ADL's / Homemaking Assistance Needed: Husband assists with LB ADLs  Comments: works with home health OT/PT     Hand Dominance   Dominant Hand: Right    Extremity/Trunk Assessment   Upper Extremity Assessment Upper Extremity Assessment: Defer  to OT evaluation    Lower Extremity Assessment Lower Extremity Assessment: LLE deficits/detail LLE Deficits / Details: no ankle ROM due to pt in walking boot, generalized weakness due to recent surgery and limited WBing status    Cervical / Trunk Assessment Cervical /  Trunk Assessment: Normal Cervical / Trunk Exceptions: Increased body habitus  Communication   Communication: No difficulties  Cognition Arousal/Alertness: Awake/alert Behavior During Therapy: WFL for tasks assessed/performed Overall Cognitive Status: Impaired/Different from baseline Area of Impairment: Problem solving                             Problem Solving: Slow processing General Comments: increased time, mild anxiety using RW vs standard walker      General Comments General comments (skin integrity, edema, etc.): spoke with RN and Dr. Eliseo Squires regarding SpO2 desaturation during ambulation on RA    Exercises     Assessment/Plan    PT Assessment Patient needs continued PT services  PT Problem List Decreased strength;Decreased activity tolerance;Decreased balance;Decreased mobility;Decreased knowledge of use of DME       PT Treatment Interventions DME instruction;Gait training;Stair training;Functional mobility training;Therapeutic activities;Therapeutic exercise;Balance training    PT Goals (Current goals can be found in the Care Plan section)  Acute Rehab PT Goals Patient Stated Goal: "I really don't want to go home with oxygen" PT Goal Formulation: With patient Time For Goal Achievement: 06/18/18 Potential to Achieve Goals: Good    Frequency Min 3X/week   Barriers to discharge        Co-evaluation               AM-PAC PT "6 Clicks" Mobility  Outcome Measure Help needed turning from your back to your side while in a flat bed without using bedrails?: None Help needed moving from lying on your back to sitting on the side of a flat bed without using bedrails?: None Help needed moving to and from a bed to a chair (including a wheelchair)?: A Little Help needed standing up from a chair using your arms (e.g., wheelchair or bedside chair)?: A Little Help needed to walk in hospital room?: A Little Help needed climbing 3-5 steps with a railing? : A  Little 6 Click Score: 20    End of Session Equipment Utilized During Treatment: Oxygen;Other (comment)(L walking boot) Activity Tolerance: Patient tolerated treatment well Patient left: in chair;with call bell/phone within reach;with family/visitor present Nurse Communication: Mobility status(desats of SpO2 on RA) PT Visit Diagnosis: Unsteadiness on feet (R26.81)    Time: 8882-8003 PT Time Calculation (min) (ACUTE ONLY): 29 min   Charges:   PT Evaluation $PT Eval Low Complexity: 1 Low PT Treatments $Gait Training: 8-22 mins        Kittie Plater, PT, DPT Acute Rehabilitation Services Pager #: [phone] Office #: [phone]   [place] Lopes 06/04/2018, 9:26 AM

## 2018-06-05 LAB — GLUCOSE, CAPILLARY
GLUCOSE-CAPILLARY: 199 mg/dL — AB (ref 70–99)
GLUCOSE-CAPILLARY: 104 mg/dL — AB (ref 70–99)
Glucose-Capillary: 175 mg/dL — ABNORMAL HIGH (ref 70–99)
Glucose-Capillary: 82 mg/dL (ref 70–99)

## 2018-06-05 LAB — BASIC METABOLIC PANEL
Anion gap: 14 (ref 5–15)
BUN: 57 mg/dL — ABNORMAL HIGH (ref 8–23)
CO2: 27 mmol/L (ref 22–32)
Calcium: 9 mg/dL (ref 8.9–10.3)
Chloride: 101 mmol/L (ref 98–111)
Creatinine, Ser: 2.09 mg/dL — ABNORMAL HIGH (ref 0.44–1.00)
GFR calc Af Amer: 27 mL/min — ABNORMAL LOW (ref >60)
GFR calc non Af Amer: 23 mL/min — ABNORMAL LOW (ref >60)
GLUCOSE: 162 mg/dL — AB (ref 70–99)
Potassium: 3.5 mmol/L (ref 3.5–5.1)
Sodium: 142 mmol/L (ref 135–145)

## 2018-06-05 LAB — URINE CULTURE: Culture: >=100000 — AB

## 2018-06-05 MED ORDER — FUROSEMIDE 10 MG/ML IJ SOLN
80.0000 mg | Freq: Three times a day (TID) | INTRAMUSCULAR | Status: DC
  Administered 2018-06-05 – 2018-06-08 (×10): 80 mg via INTRAVENOUS
  Filled 2018-06-05 (×10): qty 8

## 2018-06-05 MED ORDER — SODIUM CHLORIDE 0.9 % IV SOLN
1.0000 g | INTRAVENOUS | Status: DC
  Filled 2018-06-05: qty 10

## 2018-06-05 MED ORDER — CIPROFLOXACIN HCL 500 MG PO TABS
250.0000 mg | ORAL_TABLET | Freq: Two times a day (BID) | ORAL | Status: DC
  Administered 2018-06-05: 250 mg via ORAL
  Filled 2018-06-05 (×2): qty 1

## 2018-06-05 MED ORDER — SODIUM CHLORIDE 0.9 % IV SOLN
1.0000 g | INTRAVENOUS | Status: DC
  Administered 2018-06-05 – 2018-06-06 (×2): 1 g via INTRAVENOUS
  Filled 2018-06-05 (×4): qty 10

## 2018-06-05 NOTE — Progress Notes (Signed)
Progress Note    Kristine [name]  [id number] DOB: 10/03/1945  DOA: 06/03/2018 PCP: Prince Solian, MD    Brief Narrative:     Medical records reviewed and are as summarized below:  73 y.o. female with medical history significant of DM; afib chadvasc2 >4 on Xarelto; morbid obesit Body mass index is 41.88 kg/m.; HTN; HLD; Charcot foot s/p surgery 02/11/2018; and CHF presenting with SOB.    She has been having PT/OT at home.  Each time they came this week, her O2 was low.  Today, it was 79% persistently.  She has been SOB with therapy.  She has had orthopnea.  +LE edema despite "high doses" of Lasix - she was per her report prior on 160 bid of lasix Dr. Joelyn Oms decreased it recent 2/2 hypokalemia  Assessment/Plan:   Principal Problem:   Acute on chronic systolic CHF (congestive heart failure) (HCC) Active Problems:   Diabetes mellitus, type 2 (HCC)   High cholesterol   Atrial fibrillation (HCC)   CKD (chronic kidney disease) stage 4, GFR 15-29 ml/min (HCC)   CHF exacerbation (HCC)  Acute respiratory failure -O2 sats down to the 70s/80s while walking -recheck echo with bubble study, ? Shunt--await result -was prior on amiodarone and had PFTs/high resolution CT scan and was seen by pulmonary-- weaned off amiodarone-- though multifactorial at the time-- obesity/deconditioning etc -doubt PE as on xarelto and is compliant  Acute on chronic systolic CHF -Patient with known CHF presenting with worsening SOB and hypoxia  -CXR with vascular congestion and possible mild pulmonary edema -Elevated BNP but improved from prior -12/23/17 echo with EF 40-45% -Will start ASA -No ACE due to CKD -Continue Toprol XL -increasing IV lasix form 60 IV bid to 80 IV bid-I/o appear inaccurate   HTN -Continue Toprol XL -Hold Norvasc due to risk of edema  HLD -Will start empiric Lipitor 20 mg daily -Lipids were checked in 5/19 and LDL was 91; will repeat  DM -Last A1c was 9.4, but  now down to 7.2 -Continue Tresiba -SSI shows CBG trends 162-199  E.coli UTI resistant to cipro -cipro ordered in ER for 3 days- changed ot rocephin for 2 days only as aysmpotmatic  Afib - rate controlled at this time -Continue Xarelto  Hypokalemia -will need close monitoring while getting IV lasix  Stage IV CKD -Appears to be stable at this time -She does not appear to be a dialysis candidate -Will follow with more aggressive diuresis  obesity Body mass index is 41.88 kg/m.   Family Communication/Anticipated D/C date and plan/Code Status   DVT prophylaxis: xarelto Code Status: dnr  Family Communication: husband at bedside Disposition Plan: needs to remain in hospital for IV lasix, frequent lab checks and aggressive re-pleating of low K, as well as weaning off of oxygen   Medical Consultants:    None.    Subjective:   awake alert pleasant  Still on nasal oxygen No cp no fever  No chills Swelling per husband is greatly decreased ion LE's  Objective:    Vitals:   06/05/18 0538 06/05/18 0953 06/05/18 1227 06/05/18 1228  BP: 108/78 (!) 102/50 120/65 120/65  Pulse: 81 82 82 71  Resp: 18  18 18   Temp: 98.2 F (36.8 C)     TempSrc: Oral     SpO2: 97% 92% 100% 100%  Weight:      Height:        Intake/Output Summary (Last 24 hours) at 06/05/2018 1309 Last data  filed at 06/05/2018 1242 Gross per 24 hour  Intake 1085 ml  Output 800 ml  Net 285 ml   Filed Weights   06/03/18 1036 06/03/18 1555 06/04/18 1424  Weight: 121.1 kg 125.4 kg 126.8 kg    Exam: In chair, obese white female Few crackles but no wheezing Breathing appears mildly labored Boot on left foot No right leg edema  No rashes or lesions   Data Reviewed:   I have personally reviewed following labs and imaging studies:  Labs: Labs show the following:   Basic Metabolic Panel: Recent Labs  Lab 06/03/18 1117 06/04/18 0535 06/05/18 0503  NA 140 142 142  K 3.2* 3.0* 3.5  CL 99  101 101  CO2 28 29 27   GLUCOSE 218* 138* 162*  BUN 66* 59* 57*  CREATININE 2.29* 2.12* 2.09*  CALCIUM 9.3 8.8* 9.0  MG  --  2.3  --    GFR Estimated Creatinine Clearance: 35 mL/min (A) (by C-G formula based on SCr of 2.09 mg/dL (H)). Liver Function Tests: No results for input(s): AST, ALT, ALKPHOS, BILITOT, PROT, ALBUMIN in the last 168 hours. No results for input(s): LIPASE, AMYLASE in the last 168 hours. No results for input(s): AMMONIA in the last 168 hours. Coagulation profile No results for input(s): INR, PROTIME in the last 168 hours.  CBC: Recent Labs  Lab 06/03/18 1117 06/04/18 0535  WBC 7.5 7.1  NEUTROABS 4.5 4.2  HGB 11.6* 11.1*  HCT 39.0 36.3  MCV 90.7 90.3  PLT 180 170   Cardiac Enzymes: Recent Labs  Lab 06/03/18 1117  TROPONINI <0.03   BNP (last 3 results) No results for input(s): PROBNP in the last 8760 hours. CBG: Recent Labs  Lab 06/04/18 1129 06/04/18 1632 06/04/18 2113 06/05/18 0632 06/05/18 1156  GLUCAP 191* 140* 210* 175* 199*   D-Dimer: No results for input(s): DDIMER in the last 72 hours. Hgb A1c: Recent Labs    06/03/18 1604  HGBA1C 7.2*   Lipid Profile: Recent Labs    06/04/18 0535  CHOL 123  HDL 25*  LDLCALC 77  TRIG 105  CHOLHDL 4.9   Thyroid function studies: No results for input(s): TSH, T4TOTAL, T3FREE, THYROIDAB in the last 72 hours.  Invalid input(s): FREET3 Anemia work up: No results for input(s): VITAMINB12, FOLATE, FERRITIN, TIBC, IRON, RETICCTPCT in the last 72 hours. Sepsis Labs: Recent Labs  Lab 06/03/18 1117 06/04/18 0535  WBC 7.5 7.1    Microbiology Recent Results (from the past 240 hour(s))  Urine culture     Status: Abnormal   Collection Time: 06/03/18 12:53 PM  Result Value Ref Range Status   Specimen Description URINE, CATHETERIZED  Final   Special Requests   Final    NONE Performed at [facility], [address] [place], [place] [address]    Culture >=100,000 COLONIES/mL  ESCHERICHIA COLI (A)  Final   Report Status 06/05/2018 FINAL  Final   Organism ID, Bacteria ESCHERICHIA COLI (A)  Final      Susceptibility   Escherichia coli - MIC*    AMPICILLIN >=32 RESISTANT Resistant     CEFAZOLIN <=4 SENSITIVE Sensitive     CEFTRIAXONE <=1 SENSITIVE Sensitive     CIPROFLOXACIN >=4 RESISTANT Resistant     GENTAMICIN <=1 SENSITIVE Sensitive     IMIPENEM <=0.25 SENSITIVE Sensitive     NITROFURANTOIN <=16 SENSITIVE Sensitive     TRIMETH/SULFA >=320 RESISTANT Resistant     AMPICILLIN/SULBACTAM >=32 RESISTANT Resistant  PIP/TAZO <=4 SENSITIVE Sensitive     Extended ESBL NEGATIVE Sensitive     * >=100,000 COLONIES/mL ESCHERICHIA COLI    Procedures and diagnostic studies:  No results found.  Medications:   . aspirin EC  81 mg Oral Daily  . atorvastatin  20 mg Oral q1800  . docusate sodium  200 mg Oral BID  . febuxostat  80 mg Oral Daily  . furosemide  80 mg Intravenous Q8H  . insulin aspart  0-15 Units Subcutaneous TID WC  . insulin aspart  0-5 Units Subcutaneous QHS  . insulin glargine  96 Units Subcutaneous BH-q7a  . levothyroxine  50 mcg Oral Q0600  . metoprolol succinate  50 mg Oral BID  . potassium chloride SA  20 mEq Oral BID  . pregabalin  150 mg Oral QHS  . pregabalin  50 mg Oral Daily  . pregabalin  50 mg Oral Q24H  . Rivaroxaban  15 mg Oral Q supper  . sodium chloride flush  3 mL Intravenous Q12H   Continuous Infusions: . sodium chloride 250 mL (06/05/18 1258)  . cefTRIAXone (ROCEPHIN)  IV 1 g (06/05/18 1308)     LOS: 1 day   Tatamy  [facility] 06/05/2018, 1:09 PM

## 2018-06-06 LAB — BASIC METABOLIC PANEL
Anion gap: 10 (ref 5–15)
BUN: 50 mg/dL — ABNORMAL HIGH (ref 8–23)
CO2: 31 mmol/L (ref 22–32)
Calcium: 8.8 mg/dL — ABNORMAL LOW (ref 8.9–10.3)
Chloride: 102 mmol/L (ref 98–111)
Creatinine, Ser: 2.1 mg/dL — ABNORMAL HIGH (ref 0.44–1.00)
GFR calc Af Amer: 27 mL/min — ABNORMAL LOW (ref >60)
GFR calc non Af Amer: 23 mL/min — ABNORMAL LOW (ref >60)
Glucose, Bld: 130 mg/dL — ABNORMAL HIGH (ref 70–99)
POTASSIUM: 3 mmol/L — AB (ref 3.5–5.1)
Sodium: 143 mmol/L (ref 135–145)

## 2018-06-06 LAB — GLUCOSE, CAPILLARY
Glucose-Capillary: 56 mg/dL — ABNORMAL LOW (ref 70–99)
Glucose-Capillary: 86 mg/dL (ref 70–99)
Glucose-Capillary: 177 mg/dL — ABNORMAL HIGH (ref 70–99)
Glucose-Capillary: 173 mg/dL — ABNORMAL HIGH (ref 70–99)
Glucose-Capillary: 184 mg/dL — ABNORMAL HIGH (ref 70–99)

## 2018-06-06 NOTE — Progress Notes (Signed)
Progress Note    Kristine [name]  [id number] DOB: [birth year]  DOA: 06/03/2018 PCP: Prince Solian, MD    Brief Narrative:     Medical records reviewed and are as summarized below:  73 y.o. female with medical history significant of DM; afib chadvasc2 >4 on Xarelto; morbid obesit Body mass index is 41.88 kg/m.; HTN; HLD; Charcot foot s/p surgery 02/11/2018; and CHF presenting with SOB.    She has been having PT/OT at home.  Each time they came this week, her O2 was low.  Today, it was 79% persistently.  She has been SOB with therapy.  She has had orthopnea.  +LE edema despite "high doses" of Lasix - she was per her report prior on 160 bid of lasix Dr. Joelyn Oms decreased it recent 2/2 hypokalemia  Assessment/Plan:   Principal Problem:   Acute on chronic systolic CHF (congestive heart failure) (HCC) Active Problems:   Diabetes mellitus, type 2 (HCC)   High cholesterol   Atrial fibrillation (HCC)   CKD (chronic kidney disease) stage 4, GFR 15-29 ml/min (HCC)   CHF exacerbation (HCC)  Acute respiratory failure -O2 sats down to the 70s/80s while walking -recheck echo with bubble study, ? Shunt--await result -was prior on amiodarone and had PFTs/high resolution CT scan and was seen by pulmonary-- weaned off amiodarone-- though multifactorial at the time-- obesity/deconditioning etc -doubt PE as on xarelto and is compliant  Acute on chronic systolic CHF -Patient with known CHF presenting with worsening SOB and hypoxia  -CXR with vascular congestion and possible mild pulmonary edema -Elevated BNP but improved from prior -12/23/17 echo with EF 40-45% -Will start ASA -No ACE due to CKD -Continue Toprol XL -increasing IV lasix form 60 IV bid to 80 IV bid-I/o appear inaccurate -Weight is down from 279 pounds on the 14th-276  HTN -Continue Toprol XL -Hold Norvasc due to risk of edema  HLD -Will start empiric Lipitor 20 mg daily -Lipids were checked in 5/19 and LDL was  91; will repeat  DM -Last A1c was 9.4, but now down to 7.2 -Continue Tresiba -SSI shows CBG trends CBG ranging from 86-1 77  E.coli UTI resistant to cipro -cipro ordered in ER for 3 days- changed ot rocephin for 2 days only as aysmpotmatic  Afib - rate controlled at this time -Continue Xarelto  Hypokalemia -will need close monitoring while getting IV lasix  Stage IV CKD -Appears to be stable at this time -She does not appear to be a dialysis candidate -Will follow with more aggressive diuresis  obesity Body mass index is 41.88 kg/m.   Family Communication/Anticipated D/C date and plan/Code Status   DVT prophylaxis: xarelto Code Status: dnr  Family Communication: husband at bedside Disposition Plan: needs to remain in hospital for IV lasix, frequent lab checks and aggressive re-pleating of low K, as well as weaning off of oxygen   Medical Consultants:    None.    Subjective:   Awake but emotional wants to go home  Worried about getting boot and therapy  No other issue breathing overal the same  Objective:    Vitals:   06/05/18 1228 06/05/18 2013 06/06/18 0400 06/06/18 1317  BP: 120/65 115/72 (!) 90/51 (!) 111/53  Pulse: 71 68 91 70  Resp: 18  16 18   Temp:  98.2 F (36.8 C) 98 F (36.7 C) 97.7 F (36.5 C)  TempSrc:  Oral Oral Oral  SpO2: 100% 94% 94% 96%  Weight:  Height:        Intake/Output Summary (Last 24 hours) at 06/06/2018 1338 Last data filed at 06/06/2018 1000 Gross per 24 hour  Intake 862.4 ml  Output 2200 ml  Net -1337.6 ml   Filed Weights   06/03/18 1036 06/03/18 1555 06/04/18 1424  Weight: 121.1 kg 125.4 kg 126.8 kg    Exam: No real changed from prior exam 3/15  In chair, obese white female Few crackles but no wheezing Breathing appears mildly labored Boot on left foot No right leg edema  No rashes or lesions   Data Reviewed:   I have personally reviewed following labs and imaging studies:  Labs: Labs show  the following:   Basic Metabolic Panel: Recent Labs  Lab 06/03/18 1117 06/04/18 0535 06/05/18 0503 06/06/18 0545  NA 140 142 142 143  K 3.2* 3.0* 3.5 3.0*  CL 99 101 101 102  CO2 28 29 27 31   GLUCOSE 218* 138* 162* 130*  BUN 66* 59* 57* 50*  CREATININE 2.29* 2.12* 2.09* 2.10*  CALCIUM 9.3 8.8* 9.0 8.8*  MG  --  2.3  --   --    GFR Estimated Creatinine Clearance: 34.8 mL/min (A) (by C-G formula based on SCr of 2.1 mg/dL (H)). Liver Function Tests: No results for input(s): AST, ALT, ALKPHOS, BILITOT, PROT, ALBUMIN in the last 168 hours. No results for input(s): LIPASE, AMYLASE in the last 168 hours. No results for input(s): AMMONIA in the last 168 hours. Coagulation profile No results for input(s): INR, PROTIME in the last 168 hours.  CBC: Recent Labs  Lab 06/03/18 1117 06/04/18 0535  WBC 7.5 7.1  NEUTROABS 4.5 4.2  HGB 11.6* 11.1*  HCT 39.0 36.3  MCV 90.7 90.3  PLT 180 170   Cardiac Enzymes: Recent Labs  Lab 06/03/18 1117  TROPONINI <0.03   BNP (last 3 results) No results for input(s): PROBNP in the last 8760 hours. CBG: Recent Labs  Lab 06/05/18 1649 06/05/18 2216 06/06/18 0333 06/06/18 0423 06/06/18 1214  GLUCAP 104* 82 56* 86 177*   D-Dimer: No results for input(s): DDIMER in the last 72 hours. Hgb A1c: Recent Labs    06/03/18 1604  HGBA1C 7.2*   Lipid Profile: Recent Labs    06/04/18 0535  CHOL 123  HDL 25*  LDLCALC 77  TRIG 105  CHOLHDL 4.9   Thyroid function studies: No results for input(s): TSH, T4TOTAL, T3FREE, THYROIDAB in the last 72 hours.  Invalid input(s): FREET3 Anemia work up: No results for input(s): VITAMINB12, FOLATE, FERRITIN, TIBC, IRON, RETICCTPCT in the last 72 hours. Sepsis Labs: Recent Labs  Lab 06/03/18 1117 06/04/18 0535  WBC 7.5 7.1    Microbiology Recent Results (from the past 240 hour(s))  Urine culture     Status: Abnormal   Collection Time: 06/03/18 12:53 PM  Result Value Ref Range Status    Specimen Description URINE, CATHETERIZED  Final   Special Requests   Final    NONE Performed at [facility], [address], [place],  [address]    Culture >=100,000 COLONIES/mL ESCHERICHIA COLI (A)  Final   Report Status 06/05/2018 FINAL  Final   Organism ID, Bacteria ESCHERICHIA COLI (A)  Final      Susceptibility   Escherichia coli - MIC*    AMPICILLIN >=32 RESISTANT Resistant     CEFAZOLIN <=4 SENSITIVE Sensitive     CEFTRIAXONE <=1 SENSITIVE Sensitive     CIPROFLOXACIN >=4 RESISTANT Resistant     GENTAMICIN <=  1 SENSITIVE Sensitive     IMIPENEM <=0.25 SENSITIVE Sensitive     NITROFURANTOIN <=16 SENSITIVE Sensitive     TRIMETH/SULFA >=320 RESISTANT Resistant     AMPICILLIN/SULBACTAM >=32 RESISTANT Resistant     PIP/TAZO <=4 SENSITIVE Sensitive     Extended ESBL NEGATIVE Sensitive     * >=100,000 COLONIES/mL ESCHERICHIA COLI    Procedures and diagnostic studies:  No results found.  Medications:   . aspirin EC  81 mg Oral Daily  . atorvastatin  20 mg Oral q1800  . docusate sodium  200 mg Oral BID  . febuxostat  80 mg Oral Daily  . furosemide  80 mg Intravenous Q8H  . insulin aspart  0-15 Units Subcutaneous TID WC  . insulin aspart  0-5 Units Subcutaneous QHS  . insulin glargine  96 Units Subcutaneous BH-q7a  . levothyroxine  50 mcg Oral Q0600  . metoprolol succinate  50 mg Oral BID  . potassium chloride SA  20 mEq Oral BID  . pregabalin  150 mg Oral QHS  . pregabalin  50 mg Oral Daily  . pregabalin  50 mg Oral Q24H  . Rivaroxaban  15 mg Oral Q supper  . sodium chloride flush  3 mL Intravenous Q12H   Continuous Infusions: . sodium chloride 10 mL/hr at 06/05/18 1452  . cefTRIAXone (ROCEPHIN)  IV Stopped (06/05/18 1347)     LOS: 2 days   Jai-Gurmukh Shaquilla Kehres  [facility] 06/06/2018, 1:38 PM

## 2018-06-06 NOTE — Discharge Instructions (Addendum)
Information on my medicine - XARELTO (Rivaroxaban)  Why was Xarelto prescribed for you? Xarelto was prescribed for you to reduce the risk of a blood clot forming that can cause a stroke if you have a medical condition called atrial fibrillation (a type of irregular heartbeat).  What do you need to know about xarelto ? Take your Xarelto ONCE DAILY at the same time every day with your evening meal. If you have difficulty swallowing the tablet whole, you may crush it and mix in applesauce just prior to taking your dose.  Take Xarelto exactly as prescribed by your doctor and DO NOT stop taking Xarelto without talking to the doctor who prescribed the medication.  Stopping without other stroke prevention medication to take the place of Xarelto may increase your risk of developing a clot that causes a stroke.  Refill your prescription before you run out.  After discharge, you should have regular check-up appointments with your healthcare provider that is prescribing your Xarelto.  In the future your dose may need to be changed if your kidney function or weight changes by a significant amount.  What do you do if you miss a dose? If you are taking Xarelto ONCE DAILY and you miss a dose, take it as soon as you remember on the same day then continue your regularly scheduled once daily regimen the next day. Do not take two doses of Xarelto at the same time or on the same day.   Important Safety Information A possible side effect of Xarelto is bleeding. You should call your healthcare provider right away if you experience any of the following: ? Bleeding from an injury or your nose that does not stop. ? Unusual colored urine (red or dark brown) or unusual colored stools (red or black). ? Unusual bruising for unknown reasons. ? A serious fall or if you hit your head (even if there is no bleeding).  Some medicines may interact with Xarelto and might increase your risk of bleeding while on  Xarelto. To help avoid this, consult your healthcare provider or pharmacist prior to using any new prescription or non-prescription medications, including herbals, vitamins, non-steroidal anti-inflammatory drugs (NSAIDs) and supplements.  This website has more information on Xarelto: [email or web address].[email or web address]  Carbohydrate Counting for Diabetes Mellitus, Adult   Carbohydrate counting is a method of keeping track of how many carbohydrates you eat. Eating carbohydrates naturally increases the amount of sugar (glucose) in the blood. Counting how many carbohydrates you eat helps keep your blood glucose within normal limits, which helps you manage your diabetes (diabetes mellitus). It is important to know how many carbohydrates you can safely have in each meal. This is different for every person. A diet and nutrition specialist (registered dietitian) can help you make a meal plan and calculate how many carbohydrates you should have at each meal and snack. Carbohydrates are found in the following foods:  Grains, such as breads and cereals.  Dried beans and soy products.  Starchy vegetables, such as potatoes, peas, and corn.  Fruit and fruit juices.  Milk and yogurt.  Sweets and snack foods, such as cake, cookies, candy, chips, and soft drinks. How do I count carbohydrates? There are two ways to count carbohydrates in food. You can use either of the methods or a combination of both. Reading "Nutrition Facts" on packaged food The "Nutrition Facts" list is included on the labels of almost all packaged foods and beverages in the [place] It includes:  The serving size.  Information about  nutrients in each serving, including the grams (g) of carbohydrate per serving. To use the Nutrition Facts":  Decide how many servings you will have.  Multiply the number of servings by the number of carbohydrates per serving.  The resulting number is the total amount of carbohydrates that you will be  having. Learning standard serving sizes of other foods When you eat carbohydrate foods that are not packaged or do not include "Nutrition Facts" on the label, you need to measure the servings in order to count the amount of carbohydrates:  Measure the foods that you will eat with a food scale or measuring cup, if needed.  Decide how many standard-size servings you will eat.  Multiply the number of servings by 15. Most carbohydrate-rich foods have about 15 g of carbohydrates per serving. ? For example, if you eat 8 oz (170 g) of strawberries, you will have eaten 2 servings and 30 g of carbohydrates (2 servings x 15 g = 30 g).  For foods that have more than one food mixed, such as soups and casseroles, you must count the carbohydrates in each food that is included. The following list contains standard serving sizes of common carbohydrate-rich foods. Each of these servings has about 15 g of carbohydrates:   hamburger bun or  English muffin.   oz (15 mL) syrup.   oz (14 g) jelly.  1 slice of bread.  1 six-inch tortilla.  3 oz (85 g) cooked rice or pasta.  4 oz (113 g) cooked dried beans.  4 oz (113 g) starchy vegetable, such as peas, corn, or potatoes.  4 oz (113 g) hot cereal.  4 oz (113 g) mashed potatoes or  of a large baked potato.  4 oz (113 g) canned or frozen fruit.  4 oz (120 mL) fruit juice.  4-6 crackers.  6 chicken nuggets.  6 oz (170 g) unsweetened dry cereal.  6 oz (170 g) plain fat-free yogurt or yogurt sweetened with artificial sweeteners.  8 oz (240 mL) milk.  8 oz (170 g) fresh fruit or one small piece of fruit.  24 oz (680 g) popped popcorn. Example of carbohydrate counting Sample meal  3 oz (85 g) chicken breast.  6 oz (170 g) brown rice.  4 oz (113 g) corn.  8 oz (240 mL) milk.  8 oz (170 g) strawberries with sugar-free whipped topping. Carbohydrate calculation 1. Identify the foods that contain  carbohydrates: ? Rice. ? Corn. ? Milk. ? Strawberries. 2. Calculate how many servings you have of each food: ? 2 servings rice. ? 1 serving corn. ? 1 serving milk. ? 1 serving strawberries. 3. Multiply each number of servings by 15 g: ? 2 servings rice x 15 g = 30 g. ? 1 serving corn x 15 g = 15 g. ? 1 serving milk x 15 g = 15 g. ? 1 serving strawberries x 15 g = 15 g. 4. Add together all of the amounts to find the total grams of carbohydrates eaten: ? 30 g + 15 g + 15 g + 15 g = 75 g of carbohydrates total. Summary  Carbohydrate counting is a method of keeping track of how many carbohydrates you eat.  Eating carbohydrates naturally increases the amount of sugar (glucose) in the blood.  Counting how many carbohydrates you eat helps keep your blood glucose within normal limits, which helps you manage your diabetes.  A diet and nutrition specialist (registered dietitian) can help you make a meal plan  and calculate how many carbohydrates you should have at each meal and snack. This information is not intended to replace advice given to you by your health care provider. Make sure you discuss any questions you have with your health care provider. Document Released: 03/09/2005 Document Revised: 09/16/2016 Document Reviewed: 08/21/2015 Elsevier Interactive Patient Education  2019 [employer].

## 2018-06-06 NOTE — Progress Notes (Addendum)
Occupational Therapy Treatment Patient Details Name: Kristine [name] MRN: [id number] DOB: March 16, 1946 Today's Date: 06/06/2018    History of present illness 73 y.o. female with medical history significant of DM; afib on Xarelto; morbid obesity; HTN; HLD; Charcot foot; and CHF presenting with SOB. Recently left foot sx and was receiving HH OT/PT. On 06/03/18, pt SpO2 dropping to 79% persistently with therapy and EMS was called.    OT comments  Pt progressing towards established OT goals. Pt performing sponge bath at sink with supervision for UB and Min Guard A for LB. Pt requiring Max A to manage boot and shoe. Pt presenting with decreased executive functioning and problem solving. Throughout session, pt presenting with a joking and child-like behavior which husband says is different than baseline. Administering Short Blessed Test and pt scored a 9/28 (questionable impairment) with deficits in ST memory and orientation to time of day. Will continue to assess cognition and follow acutely to facilitate safe dc. Continue to recommend dc to home with 24/7 supervision and [place].    Follow Up Recommendations  Home health OT;Supervision/Assistance - 24 hour(Requesting [facility] if possible.)    Equipment Recommendations  None recommended by OT    Recommendations for Other Services PT consult    Precautions / Restrictions Precautions Precautions: Fall;Other (comment)(O2) Precaution Comments: Watch O2 Required Braces or Orthoses: Other Brace Other Brace: Left boot, R foot elevator to match L foot Restrictions Weight Bearing Restrictions: Yes LLE Weight Bearing: Partial weight bearing LLE Partial Weight Bearing Percentage or Pounds: (50) Other Position/Activity Restrictions: Wear boot       Mobility Bed Mobility Overal bed mobility: Needs Assistance Bed Mobility: Supine to Sit;Sit to Supine     Supine to sit: Supervision Sit to supine: Supervision   General bed mobility  comments: supervision for safety  Transfers Overall transfer level: Needs assistance Equipment used: Rolling walker (2 wheeled) Transfers: Sit to/from Stand Sit to Stand: Min guard         General transfer comment: increased time but good technique    Balance Overall balance assessment: Needs assistance Sitting-balance support: No upper extremity supported;Feet supported Sitting balance-Leahy Scale: Fair     Standing balance support: Bilateral upper extremity supported;During functional activity Standing balance-Leahy Scale: Poor Standing balance comment: Reliant on Ue support                           ADL either performed or assessed with clinical judgement   ADL Overall ADL's : Needs assistance/impaired Eating/Feeding: Independent   Grooming: Set up;Supervision/safety;Sitting;Brushing hair;Wash/dry face Grooming Details (indicate cue type and reason): Supervision for safety while sitting at sink to perform grooming.  Upper Body Bathing: Supervision/ safety;Sitting Upper Body Bathing Details (indicate cue type and reason): Pt demonstrating increased actiivty tolerance to perform UB bathing. Pt reaching forward to manage sink and faucet.  Lower Body Bathing: Min guard;Sit to/from stand Lower Body Bathing Details (indicate cue type and reason): Min Guard A for safety in standing while pt performs LB ADLs. Upper Body Dressing : Supervision/safety;Sitting Upper Body Dressing Details (indicate cue type and reason): to change gown Lower Body Dressing: Maximal assistance;Sit to/from stand Lower Body Dressing Details (indicate cue type and reason): Max A to don boot and shoe. husband Cabin crew: Ambulation;RW;Regular Toilet;Min Psychiatric nurse Details (indicate cue type and reason): Min Guard A for safety         Functional mobility during ADLs: Min guard;Rolling walker General ADL  Comments: SpO2 >90% on 3L     Vision       Perception      Praxis      Cognition Arousal/Alertness: Awake/alert Behavior During Therapy: WFL for tasks assessed/performed Overall Cognitive Status: Impaired/Different from baseline Area of Impairment: Problem solving;Safety/judgement                         Safety/Judgement: Decreased awareness of deficits   Problem Solving: Slow processing;Requires verbal cues General Comments: Pt presenting with childish behaviors and feel this may be to mask executive functioning deficits. At begining of session, pt reporting she hasn't taken a bath in awhile and wanted to bath. Discussed sponge bath at sink. Pt then repeating to ask what the plan was and why we were going to the sink. Pt stating comments in a child like voice, "do you think I stink" and OT repeated that the patient had wanted a bath. Pt also with decreased problem solving during bathing and required cues. Administering the SBT and pt scoring 9/28 with deficits in recalling the time of day and remembering ST memory words. Throughout this assessment, pt singing her answers and being very playful        Exercises     Shoulder Instructions       General Comments Husband present throughout    Pertinent Vitals/ Pain       Pain Assessment: No/denies pain  Home Living                                          Prior Functioning/Environment              Frequency  Min 2X/week        Progress Toward Goals  OT Goals(current goals can now be found in the care plan section)  Progress towards OT goals: Progressing toward goals  Acute Rehab OT Goals Patient Stated Goal: "I really don't want to go home with oxygen" OT Goal Formulation: With patient/family Time For Goal Achievement: 06/18/18 Potential to Achieve Goals: Good ADL Goals Pt Will Perform Grooming: with modified independence;sitting;standing Pt Will Transfer to Toilet: with modified independence;bedside commode;ambulating Pt Will Perform  Toileting - Clothing Manipulation and hygiene: with modified independence;sit to/from stand Pt Will Perform Tub/Shower Transfer: Shower transfer;3 in 1;rolling walker;ambulating;with min guard assist Additional ADL Goal #1: Pt will verbalize three energy conservation techniques for ADLs with 2-3 cues  Plan Discharge plan remains appropriate    Co-evaluation                 AM-PAC OT "6 Clicks" Daily Activity     Outcome Measure   Help from another person eating meals?: None Help from another person taking care of personal grooming?: A Little Help from another person toileting, which includes using toliet, bedpan, or urinal?: A Little Help from another person bathing (including washing, rinsing, drying)?: A Little Help from another person to put on and taking off regular upper body clothing?: None Help from another person to put on and taking off regular lower body clothing?: A Lot 6 Click Score: 19    End of Session Equipment Utilized During Treatment: Rolling walker;Oxygen  OT Visit Diagnosis: Unsteadiness on feet (R26.81);Other abnormalities of gait and mobility (R26.89);Muscle weakness (generalized) (M62.81)   Activity Tolerance Patient tolerated treatment well   Patient Left in chair;with call bell/phone within  reach;with family/visitor present   Nurse Communication Mobility status;Weight bearing status        Time: 1517-1550 OT Time Calculation (min): 33 min  Charges: OT General Charges $OT Visit: 1 Visit OT Treatments $Self Care/Home Management : 23-37 mins  [place], OTR/L Acute Rehab Pager: [phone] Office: [place] 06/06/2018, 4:28 PM

## 2018-06-06 NOTE — Progress Notes (Signed)
SATURATION QUALIFICATIONS: (This note is used to comply with regulatory documentation for home oxygen)  Patient Saturations on Room Air at Rest = 86%  Patient Saturations on Room Air while Ambulating = NT as desat on RA at rest  Patient Saturations on 3 Liters of oxygen while Ambulating = 90%  Please briefly explain why patient needs home oxygen:Pt desats on RA at rest and needed O2 to keep sats >90%.  Encouraged incentive spirometer.   [place] Pager:  [phone]  Office:  [phone]

## 2018-06-06 NOTE — Progress Notes (Signed)
   06/06/18 0845  Mobility  Activity Ambulated in hall;Ambulated in room;Transferred:  Bed to chair (To chair after ambulation)  Range of Motion Active;All extremities  Level of Assistance Standby assist, set-up cues, supervision of patient - no hands on  Assistive Device Front wheel walker  LLE Weight Bearing PWB  LLE Partial Weight Bearing Percentage or Pounds  (50)  Minutes Stood 10 minutes  Minutes Ambulated 10 minutes  Distance Ambulated (ft) 70 ft  Mobility Response Tolerated well;RN notified (c/o pain in LLE )  Bed Position Chair   SATURATION QUALIFICATIONS: (This note is used to comply with regulatory documentation for home oxygen)  Patient Saturations on Room Air at Rest = 81%  Patient Saturations on Room Air while Ambulating = 0%  Patient Saturations on 2 Liters of oxygen while Ambulating = 93%  Please briefly explain why patient needs home oxygen: Patient desaturated on room air, on 2LO2 patient saturated 90-93% and occasionally dropping to 89%

## 2018-06-06 NOTE — TOC Initial Note (Signed)
Transition of Care [facility]) - Initial/Assessment Note    Patient Details  Name: Kristine [name] MRN: [id number] Date of Birth: [birth year]  Transition of Care [facility]) CM/SW Contact:    Carrissa, Taitano Phone Number: [phone] 06/06/2018, 12:01 PM  Clinical Narrative:                 Patient lives at home with spouse, she is a Ret Engineer, production; PCP: Prince Solian, MD; has private insurance with [employer] with prescription drug coverage; [facility] [place]; DME - walker and cane at home; Patient is agreeable to [facility] services; [place] choice offered, pt chose [place]; Dan with [facility] called for arrangements. awaiting for possible needs for home oxygen; CM will continue to follow for progression of care.  Expected Discharge Plan: [place] Barriers to Discharge: No Barriers Identified   Patient Goals and CMS Choice Patient states their goals for this hospitalization and ongoing recovery are:: to breathe better CMS Medicare.gov Compare Post Acute Care list provided to:: Patient Choice offered to / list presented to : Patient  Expected Discharge Plan and Services Expected Discharge Plan: [place] Discharge Planning Services: CM Consult   Living arrangements for the past 2 months: Single Family Home Expected Discharge Date: 06/05/18               DME Arranged: Oxygen DME Agency: [facility] HH Arranged: RN, Disease Management, PT [place] Agency: [place] ([place])  Prior Living Arrangements/Services Living arrangements for the past 2 months: Single Family Home Lives with:: Spouse Patient language and need for interpreter reviewed:: No Do you feel safe going back to the place where you live?: Yes      Need for Family Participation in Patient Care: No (Comment) Care giver support system in place?: Yes (comment)   Criminal Activity/Legal Involvement Pertinent to Current  Situation/Hospitalization: No - Comment as needed  Activities of Daily Living Home Assistive Devices/Equipment: Walker (specify type) ADL Screening (condition at time of admission) Patient's cognitive ability adequate to safely complete daily activities?: No Is the patient deaf or have difficulty hearing?: No Does the patient have difficulty seeing, even when wearing glasses/contacts?: No Does the patient have difficulty concentrating, remembering, or making decisions?: No Patient able to express need for assistance with ADLs?: Yes Does the patient have difficulty dressing or bathing?: Yes Independently performs ADLs?: No Communication: Independent Dressing (OT): Needs assistance Is this a change from baseline?: Pre-admission baseline Grooming: Needs assistance Is this a change from baseline?: Pre-admission baseline Feeding: Independent Bathing: Needs assistance Is this a change from baseline?: Pre-admission baseline Toileting: Needs assistance Is this a change from baseline?: Pre-admission baseline In/Out Bed: Needs assistance Is this a change from baseline?: Pre-admission baseline Walks in Home: Needs assistance Is this a change from baseline?: Pre-admission baseline Does the patient have difficulty walking or climbing stairs?: Yes Weakness of Legs: Both Weakness of Arms/Hands: Both  Permission Sought/Granted Permission sought to share information with : Case Manager Permission granted to share information with : Yes, Verbal Permission Granted  Share Information with NAME: Spouse and Helene Kelp ( daughter)  Permission granted to share info w AGENCY: [place] agency / DME        Emotional Assessment Appearance:: Well-Groomed Attitude/Demeanor/Rapport: Gracious, Engaged, Self-Confident, Charismatic Affect (typically observed): Accepting Orientation: : Oriented to Self, Oriented to Place, Oriented to  Time, Oriented to Situation Alcohol / Substance Use: Not Applicable Psych  Involvement: No (  comment)  Admission diagnosis:  Acute cystitis without hematuria [N30.00] Acute on chronic congestive heart failure, unspecified heart failure type [facility]) [I50.9] Patient Active Problem List   Diagnosis Date Noted  . CHF exacerbation ([place]) 06/04/2018  . Acute on chronic systolic CHF (congestive heart failure) ([place]) 06/03/2018  . CKD (chronic kidney disease) stage 4, GFR 15-29 ml/min (HCC) 06/03/2018  . Gait abnormality 02/09/2017  . Obstructive sleep apnea 02/09/2017  . Diabetic peripheral neuropathy ([place]) 09/02/2016  . Peripheral neuropathy 07/29/2016  . Spinal stenosis of lumbar region 07/29/2016  . Excessive daytime sleepiness 07/29/2016  . Morbid obesity ([place]) 07/29/2016  . 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 Community acquired pneumonia 05/31/2015  . CAP (community acquired pneumonia) 05/31/2015  . Anemia 05/31/2015  . Diastolic CHF, chronic ([place]) 05/31/2015  . Paroxysmal atrial fibrillation (HCC)   . Obesity 10/15/2013  . Diastolic dysfunction with chronic heart failure ([place]) 06/23/2013  . Atrial fibrillation ([place]) 06/20/2010  . Pneumonia   . Abnormal cardiovascular function study   . High cholesterol   . Scoliosis   . Spinal stenosis   . Anxiety   . Osteoarthritis   . Osteoporosis   . Mild aortic sclerosis ([place])   . Degenerative disc disease   . Vitamin D deficiency   . HEMOPTYSIS UNSPECIFIED 05/12/2010  . Diabetes mellitus, type 2 ([place]) 11/19/2009  . Hypertensive cardiovascular disease 11/19/2009  . DYSPNEA ON EXERTION 11/19/2009   PCP:  Prince Solian, MD [facility]   [id number] [place] [id number]: [phone]: [phone]    Social Determinants of Health (SDOH) Interventions    Readmission Risk Interventions 30 Day Unplanned Readmission Risk Score     ED to Hosp-Admission (Current) [facility]/03/2018 [facility] [place]  30 Day Unplanned  Readmission Risk Score (%)  19 Filed at 06/06/2018 0801     This score is the patient's risk of an unplanned readmission within 30 days of being discharged (0 -100%). The score is based on dignosis, age, lab data, medications, orders, and past utilization.   Low:  0-14.9   Medium: 15-21.9   High: 22-29.9   Extreme: 30 and above       No flowsheet data found.

## 2018-06-06 NOTE — Progress Notes (Signed)
Inpatient Diabetes Program Recommendations  AACE/ADA: New Consensus Statement on Inpatient Glycemic Control (2015)  Target Ranges:  Prepandial:   less than 140 mg/dL      Peak postprandial:   less than 180 mg/dL (1-2 hours)      Critically ill patients:  140 - 180 mg/dL   Lab Results  Component Value Date   GLUCAP 177 (H) 06/06/2018   HGBA1C 7.2 (H) 06/03/2018    Review of Glycemic Control  Consult per patient request. Patient had questions about inpatient regimen versus what she takes outpatient. Explained Lantus is a long acting insulin similar to the [place] that she is on at home. Explained the reason for Novolog correction scale and basal bolus therapy int he inpatient setting. Discussed low carb low sodium diet modifications with patient and husband. They had many questions regarding diet.  Thanks,  Tama Headings RN, MSN, BC-ADM Inpatient Diabetes Coordinator Team Pager [phone] (8a-5p)

## 2018-06-06 NOTE — Progress Notes (Signed)
Inpatient Diabetes Program Recommendations  AACE/ADA: New Consensus Statement on Inpatient Glycemic Control (2015)  Target Ranges:  Prepandial:   less than 140 mg/dL      Peak postprandial:   less than 180 mg/dL (1-2 hours)      Critically ill patients:  140 - 180 mg/dL   Results for Kristine [name], Kristine [name] (MRN [id number]) as of 06/06/2018 10:25  Ref. Range 06/05/2018 06:32 06/05/2018 11:56 06/05/2018 16:49 06/05/2018 22:16 06/06/2018 03:33 06/06/2018 04:23  Glucose-Capillary Latest Ref Range: 70 - 99 mg/dL 175 (H) 199 (H) 104 (H) 82 56 (L) 86    Review of Glycemic Control  Diabetes history: DM 2 Outpatient Diabetes medications: Tresiba 96 units qam, Novolog 30 units tid Current orders for Inpatient glycemic control: Lantus 96 units am, Novolog 0-15 units tid, Novolog 0-5 units qhs  Inpatient Diabetes Program Recommendations:    Hypoglycemia this am 56 mg/dl. Consider decreasing Lantus to 85-90 units qam.   Thanks,  Tama Headings RN, MSN, BC-ADM Inpatient Diabetes Coordinator Team Pager [phone] (8a-5p)

## 2018-06-06 NOTE — Progress Notes (Signed)
Physical Therapy Treatment Patient Details Name: Kristine [name] MRN: [id number] DOB: August 23, 1945 Today's Date: 06/06/2018    History of Present Illness 73 y.o. female with medical history significant of DM; afib on Xarelto; morbid obesity; HTN; HLD; Charcot foot; and CHF presenting with SOB. Recently left foot sx and was receiving HH OT/PT. On 06/03/18, pt SpO2 dropping to 79% persistently with therapy and EMS was called.     PT Comments    Pt admitted with above diagnosis. Pt currently with functional limitations due to the deficits listed below (see PT Problem List). Pt was able to ambulate with RW with good sequencing overall with cues to stay close to RW.  Pt desats on RA and needed 3LO2 to keep sats >90%.  Will follow acutely.   Pt will benefit from skilled PT to increase their independence and safety with mobility to allow discharge to the venue listed below.    SATURATION QUALIFICATIONS: (This note is used to comply with regulatory documentation for home oxygen)  Patient Saturations on Room Air at Rest = 86%  Patient Saturations on Room Air while Ambulating = NT as desat on RA at rest  Patient Saturations on 3 Liters of oxygen while Ambulating = 90%  Please briefly explain why patient needs home oxygen:Pt desats on RA at rest and needed O2 to keep sats >90%.   Follow Up Recommendations  Home health PT;Supervision/Assistance - 24 hour(continue with previous set up)     Equipment Recommendations  None recommended by PT    Recommendations for Other Services       Precautions / Restrictions Precautions Precautions: Fall;Other (comment)(O2) Precaution Comments: Watch O2 Required Braces or Orthoses: Other Brace Other Brace: Left boot, R foot elevator to match L foot Restrictions Weight Bearing Restrictions: Yes LLE Weight Bearing: Partial weight bearing LLE Partial Weight Bearing Percentage or Pounds: 50% Other Position/Activity Restrictions: Wear boot    Mobility  Bed  Mobility               General bed mobility comments: sitting up in chair upon PT arrival  Transfers Overall transfer level: Needs assistance Equipment used: Rolling walker (2 wheeled) Transfers: Sit to/from Stand Sit to Stand: Min guard         General transfer comment: increased time but good technique  Ambulation/Gait Ambulation/Gait assistance: Min guard Gait Distance (Feet): 125 Feet Assistive device: Rolling walker (2 wheeled) Gait Pattern/deviations: Decreased stride length;Step-through pattern;Drifts right/left;Trunk flexed;Wide base of support Gait velocity: dec Gait velocity interpretation: <1.31 ft/sec, indicative of household ambulator General Gait Details: Pt steady but did need cues to sequence steps and RW as well as cues to stay close to RW, pt with SOB SpO2 dec to 86% on RA. Incr to 88% on 2L and incr  to 91% once on 3 LO2 via [place].     Stairs             Wheelchair Mobility    Modified Rankin (Stroke Patients Only)       Balance Overall balance assessment: Needs assistance Sitting-balance support: No upper extremity supported;Feet supported Sitting balance-Leahy Scale: Fair     Standing balance support: Bilateral upper extremity supported;During functional activity Standing balance-Leahy Scale: Poor Standing balance comment: Reliant on Ue support                            Cognition Arousal/Alertness: Awake/alert Behavior During Therapy: WFL for tasks assessed/performed Overall Cognitive Status: Impaired/Different from baseline  Area of Impairment: Problem solving                             Problem Solving: Slow processing General Comments: increased time      Exercises      General Comments General comments (skin integrity, edema, etc.): Nurse aware of desaturation on RA. Need for 3LO2.       Pertinent Vitals/Pain Pain Assessment: No/denies pain    Home Living                      Prior  Function            PT Goals (current goals can now be found in the care plan section) Progress towards PT goals: Progressing toward goals    Frequency    Min 3X/week      PT Plan Current plan remains appropriate    Co-evaluation              AM-PAC PT "6 Clicks" Mobility   Outcome Measure  Help needed turning from your back to your side while in a flat bed without using bedrails?: None Help needed moving from lying on your back to sitting on the side of a flat bed without using bedrails?: None Help needed moving to and from a bed to a chair (including a wheelchair)?: A Little Help needed standing up from a chair using your arms (e.g., wheelchair or bedside chair)?: A Little Help needed to walk in hospital room?: A Little Help needed climbing 3-5 steps with a railing? : A Little 6 Click Score: 20    End of Session Equipment Utilized During Treatment: Oxygen;Other (comment)(L walking boot) Activity Tolerance: Patient tolerated treatment well Patient left: with call bell/phone within reach;with family/visitor present;in bed Nurse Communication: Mobility status(desats of SpO2 on RA) PT Visit Diagnosis: Unsteadiness on feet (R26.81)     Time: 1657-9038 PT Time Calculation (min) (ACUTE ONLY): 16 min  Charges:  $Gait Training: 8-22 mins                     [place] Pager:  [phone]  Office:  [place] 06/06/2018, 12:27 PM

## 2018-06-07 LAB — RENAL FUNCTION PANEL
Albumin: 3.1 g/dL — ABNORMAL LOW (ref 3.5–5.0)
Anion gap: 9 (ref 5–15)
BUN: 45 mg/dL — AB (ref 8–23)
CO2: 31 mmol/L (ref 22–32)
CREATININE: 1.96 mg/dL — AB (ref 0.44–1.00)
Calcium: 8.8 mg/dL — ABNORMAL LOW (ref 8.9–10.3)
Chloride: 102 mmol/L (ref 98–111)
GFR calc Af Amer: 29 mL/min — ABNORMAL LOW (ref >60)
GFR calc non Af Amer: 25 mL/min — ABNORMAL LOW (ref >60)
Glucose, Bld: 155 mg/dL — ABNORMAL HIGH (ref 70–99)
POTASSIUM: 4 mmol/L (ref 3.5–5.1)
Phosphorus: 3.8 mg/dL (ref 2.5–4.6)
Sodium: 142 mmol/L (ref 135–145)

## 2018-06-07 LAB — GLUCOSE, CAPILLARY
GLUCOSE-CAPILLARY: 155 mg/dL — AB (ref 70–99)
GLUCOSE-CAPILLARY: 134 mg/dL — AB (ref 70–99)
Glucose-Capillary: 140 mg/dL — ABNORMAL HIGH (ref 70–99)
Glucose-Capillary: 171 mg/dL — ABNORMAL HIGH (ref 70–99)

## 2018-06-07 NOTE — Progress Notes (Signed)
Physical Therapy Treatment Patient Details Name: Kristine [name] MRN: [id number] DOB: 11-Mar-1946 Today's Date: 06/07/2018    History of Present Illness 73 y.o. female with medical history significant of DM; afib on Xarelto; morbid obesity; HTN; HLD; Charcot foot; and CHF presenting with SOB. Recently left foot sx and was receiving HH OT/PT. On 06/03/18, pt SpO2 dropping to 79% persistently with therapy and EMS was called.     PT Comments    Patient progressing well towards PT goals. Seems to be clearer with regards to cognition today. Improved ambulation distance with close min guard for balance/safety. Requires cues for 50% WB through LLE throughout gait esp when fatigued. Sp02 remained >90% on RA during ambulation, dropped to 86% once done. Able to perform pursed lip breathing and oxygenation techniques (prior RN) to improve sats. Decreased 02 to 2L/min 02. Will continue to wean as tolerated. Encouraged walking to bathroom with nursing daily. Will follow acutely.    Follow Up Recommendations  Home health PT;Supervision/Assistance - 24 hour(continue with therapy services)     Equipment Recommendations  None recommended by PT    Recommendations for Other Services       Precautions / Restrictions Precautions Precautions: Fall Precaution Comments: Watch O2 Required Braces or Orthoses: Other Brace Other Brace: Left boot, R foot elevator to match L foot Restrictions Weight Bearing Restrictions: Yes LLE Weight Bearing: Partial weight bearing LLE Partial Weight Bearing Percentage or Pounds: 50    Mobility  Bed Mobility               General bed mobility comments: Up in chair upon PT arrival.   Transfers Overall transfer level: Needs assistance Equipment used: Rolling walker (2 wheeled) Transfers: Sit to/from Stand Sit to Stand: Min guard         General transfer comment: Min guard for safety. Stood from Youth worker. No dizziness.   Ambulation/Gait Ambulation/Gait  assistance: Min guard Gait Distance (Feet): 200 Feet Assistive device: Rolling walker (2 wheeled) Gait Pattern/deviations: Decreased stride length;Step-through pattern;Drifts right/left;Trunk flexed;Wide base of support Gait velocity: dec   General Gait Details: Slow, steady gait with RW, cues for RW proximity and for 50% WB through LLE. When fatigueing, pt tends to put more weight through LLE. Sp02 remained >90% on RA, dropped to 86% post ambulation so donned 1L/min 02.   Stairs             Wheelchair Mobility    Modified Rankin (Stroke Patients Only)       Balance Overall balance assessment: Needs assistance Sitting-balance support: No upper extremity supported;Feet supported Sitting balance-Leahy Scale: Fair     Standing balance support: Bilateral upper extremity supported;During functional activity Standing balance-Leahy Scale: Poor Standing balance comment: Reliant on Ue support                            Cognition Arousal/Alertness: Awake/alert Behavior During Therapy: WFL for tasks assessed/performed Overall Cognitive Status: Within Functional Limits for tasks assessed                                 General Comments: for basic mobility tasks. 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 Clearer today.      Exercises      General Comments General comments (skin integrity, edema, etc.): Daughter prsent during session.  Pertinent Vitals/Pain Pain Assessment: No/denies pain    Home Living                      Prior Function            PT Goals (current goals can now be found in the care plan section) Progress towards PT goals: Progressing toward goals    Frequency    Min 3X/week      PT Plan Current plan remains appropriate    Co-evaluation              AM-PAC PT "6 Clicks" Mobility   Outcome Measure  Help needed turning from your back  to your side while in a flat bed without using bedrails?: None Help needed moving from lying on your back to sitting on the side of a flat bed without using bedrails?: None Help needed moving to and from a bed to a chair (including a wheelchair)?: A Little Help needed standing up from a chair using your arms (e.g., wheelchair or bedside chair)?: A Little Help needed to walk in hospital room?: A Little Help needed climbing 3-5 steps with a railing? : A Little 6 Click Score: 20    End of Session Equipment Utilized During Treatment: Oxygen;Other (comment)(CAM boot) Activity Tolerance: Patient tolerated treatment well Patient left: with call bell/phone within reach;with family/visitor present;in chair Nurse Communication: Mobility status PT Visit Diagnosis: Unsteadiness on feet (R26.81)     Time: 1534-1600 PT Time Calculation (min) (ACUTE ONLY): 26 min  Charges:  $Gait Training: 23-37 mins                     Wray Kearns, PT, DPT Acute Rehabilitation Services Pager [phone] [facility] [place] 06/07/2018, 4:11 PM

## 2018-06-07 NOTE — Plan of Care (Signed)

## 2018-06-07 NOTE — Care Management Important Message (Signed)
Important Message  Patient Details  Name: Kristine [name] MRN: [id number] Date of Birth: Jun 03, 1945   Medicare Important Message Given:  Yes    Krupa Stege P Elizeo Rodriques 06/07/2018, 1:56 PM

## 2018-06-07 NOTE — Progress Notes (Signed)
Progress Note    Kristine [name]  YSA:[id number] DOB: 1945-08-20  DOA: 06/03/2018 PCP: Prince Solian, MD    Brief Narrative:     Medical records reviewed and are as summarized below:  73 y.o. female with medical history significant of DM; afib chadvasc2 >4 on Xarelto; morbid obesit Body mass index is 41.28 kg/m.; HTN; HLD; Charcot foot s/p surgery 02/11/2018; and CHF presenting with SOB.    She has been having PT/OT at home.  Each time they came this week, her O2 was low.  Today, it was 79% persistently.  She has been SOB with therapy.  She has had orthopnea.  +LE edema despite "high doses" of Lasix - she was per her report prior on 160 bid of lasix Dr. Joelyn Oms decreased it recent 2/2 hypokalemia  Assessment/Plan:   Principal Problem:   Acute on chronic systolic CHF (congestive heart failure) (HCC) Active Problems:   Diabetes mellitus, type 2 (HCC)   High cholesterol   Atrial fibrillation (HCC)   CKD (chronic kidney disease) stage 4, GFR 15-29 ml/min (HCC)   CHF exacerbation (HCC)  Acute respiratory failure -O2 sats down to the 70s/80s while walking -echo 3/14 improved from priro-EF 50-55, prior 45% -was prior on amiodarone and had PFTs/high resolution CT scan and was seen by pulmonary-- weaned off amiodarone-- though multifactorial at the time-- obesity/deconditioning etc -doubt PE as on xarelto and is compliant -probably will need oxygen on d/c  Acute on chronic systolic CHF -Patient with known CHF presenting with worsening SOB and hypoxia  -CXR with vascular congestion and possible mild pulmonary edema -Elevated BNP but improved from prior -12/23/17 echo with EF 40-45% -Will start ASA -No ACE due to CKD -Continue Toprol XL -increasing IV lasix form 60 IV bid to 80 IV bid -Weight is down from 279 pounds on the 14th-276---her baseline weight on report is about 266 -she needs several more days of diuresis prior to d/c home and is aware  HTN -Continue Toprol XL  -Hold Norvasc due to risk of edema  HLD -Will start empiric Lipitor 20 mg daily -Lipids ldl dowwn from 91-->77  DM -Last A1c was 9.4, but now down to 7.2 -Continue Tresiba  -SSI shows CBG trends CBG ranging from 140-184  E.coli UTI resistant to cipro -cipro ordered in ER for 3 days- changed to rocephin for 2 days only as asymptomatic -stopping today 3/17  Afib - rate controlled at this time -Continue Xarelto  Hypokalemia -will need close monitoring while getting IV lasix  Stage IV CKD -Appears to be stable at this time and creatinine is droppign with diuresis, suggesting some element cardiorenal syn -She does not appear to be a dialysis candidate -Will follow with more aggressive diuresis  obesity Body mass index is 41.28 kg/m.   Family Communication/Anticipated D/C date and plan/Code Status   DVT prophylaxis: xarelto Code Status: dnr  Family Communication: husband at bedside Disposition Plan: needs to remain in hospital for IV lasix, exepct can go home in 48-72 hours once closer to dry weight   Medical Consultants:    None.    Subjective:   Overall well Feels good No Le pain SOB fair no cp no fever  Objective:    Vitals:   06/06/18 1400 06/06/18 2007 06/07/18 0434 06/07/18 0859  BP:  130/80 (!) 94/40 101/60  Pulse:  92 85 85  Resp:  20 20   Temp:  (!) 97.5 F (36.4 C) 97.7 F (36.5 C)   TempSrc:  Oral Oral   SpO2:  93% 96%   Weight: 125.5 kg  125 kg   Height:        Intake/Output Summary (Last 24 hours) at 06/07/2018 1026 Last data filed at 06/07/2018 0908 Gross per 24 hour  Intake 879.93 ml  Output 1700 ml  Net -820.07 ml   Filed Weights   06/04/18 1424 06/06/18 1400 06/07/18 0434  Weight: 126.8 kg 125.5 kg 125 kg    Exam:  In chair, obese white female No rales no rhinchi No JVD Breathing appears mildly labored Boot on left foot No right leg edema  No rashes or lesions   Data Reviewed:   I have personally reviewed  following labs and imaging studies:  Labs: Labs show the following:   Basic Metabolic Panel: Recent Labs  Lab 06/03/18 1117 06/04/18 0535 06/05/18 0503 06/06/18 0545 06/07/18 0405  NA 140 142 142 143 142  K 3.2* 3.0* 3.5 3.0* 4.0  CL 99 101 101 102 102  CO2 28 29 27 31 31   GLUCOSE 218* 138* 162* 130* 155*  BUN 66* 59* 57* 50* 45*  CREATININE 2.29* 2.12* 2.09* 2.10* 1.96*  CALCIUM 9.3 8.8* 9.0 8.8* 8.8*  MG  --  2.3  --   --   --   PHOS  --   --   --   --  3.8   GFR Estimated Creatinine Clearance: 37 mL/min (A) (by C-G formula based on SCr of 1.96 mg/dL (H)). Liver Function Tests: Recent Labs  Lab 06/07/18 0405  ALBUMIN 3.1*   No results for input(s): LIPASE, AMYLASE in the last 168 hours. No results for input(s): AMMONIA in the last 168 hours. Coagulation profile No results for input(s): INR, PROTIME in the last 168 hours.  CBC: Recent Labs  Lab 06/03/18 1117 06/04/18 0535  WBC 7.5 7.1  NEUTROABS 4.5 4.2  HGB 11.6* 11.1*  HCT 39.0 36.3  MCV 90.7 90.3  PLT 180 170   Cardiac Enzymes: Recent Labs  Lab 06/03/18 1117  TROPONINI <0.03   BNP (last 3 results) No results for input(s): PROBNP in the last 8760 hours. CBG: Recent Labs  Lab 06/06/18 0423 06/06/18 1214 06/06/18 1622 06/06/18 2139 06/07/18 0632  GLUCAP 86 177* 173* 184* 140*   D-Dimer: No results for input(s): DDIMER in the last 72 hours. Hgb A1c: No results for input(s): HGBA1C in the last 72 hours. Lipid Profile: No results for input(s): CHOL, HDL, LDLCALC, TRIG, CHOLHDL, LDLDIRECT in the last 72 hours. Thyroid function studies: No results for input(s): TSH, T4TOTAL, T3FREE, THYROIDAB in the last 72 hours.  Invalid input(s): FREET3 Anemia work up: No results for input(s): VITAMINB12, FOLATE, FERRITIN, TIBC, IRON, RETICCTPCT in the last 72 hours. Sepsis Labs: Recent Labs  Lab 06/03/18 1117 06/04/18 0535  WBC 7.5 7.1    Microbiology Recent Results (from the past 240 hour(s))   Urine culture     Status: Abnormal   Collection Time: 06/03/18 12:53 PM  Result Value Ref Range Status   Specimen Description URINE, CATHETERIZED  Final   Special Requests   Final    NONE Performed at [facility], [address], [place], [place] [address]    Culture >=100,000 COLONIES/mL ESCHERICHIA COLI (A)  Final   Report Status 06/05/2018 FINAL  Final   Organism ID, Bacteria ESCHERICHIA COLI (A)  Final      Susceptibility   Escherichia coli - MIC*    AMPICILLIN >=32 RESISTANT Resistant  CEFAZOLIN <=4 SENSITIVE Sensitive     CEFTRIAXONE <=1 SENSITIVE Sensitive     CIPROFLOXACIN >=4 RESISTANT Resistant     GENTAMICIN <=1 SENSITIVE Sensitive     IMIPENEM <=0.25 SENSITIVE Sensitive     NITROFURANTOIN <=16 SENSITIVE Sensitive     TRIMETH/SULFA >=320 RESISTANT Resistant     AMPICILLIN/SULBACTAM >=32 RESISTANT Resistant     PIP/TAZO <=4 SENSITIVE Sensitive     Extended ESBL NEGATIVE Sensitive     * >=100,000 COLONIES/mL ESCHERICHIA COLI    Procedures and diagnostic studies:  No results found.  Medications:   . aspirin EC  81 mg Oral Daily  . atorvastatin  20 mg Oral q1800  . docusate sodium  200 mg Oral BID  . febuxostat  80 mg Oral Daily  . furosemide  80 mg Intravenous Q8H  . insulin aspart  0-15 Units Subcutaneous TID WC  . insulin aspart  0-5 Units Subcutaneous QHS  . insulin glargine  96 Units Subcutaneous BH-q7a  . levothyroxine  50 mcg Oral Q0600  . metoprolol succinate  50 mg Oral BID  . potassium chloride SA  20 mEq Oral BID  . pregabalin  150 mg Oral QHS  . pregabalin  50 mg Oral Daily  . pregabalin  50 mg Oral Q24H  . Rivaroxaban  15 mg Oral Q supper  . sodium chloride flush  3 mL Intravenous Q12H   Continuous Infusions: . sodium chloride 10 mL/hr at 06/05/18 1452  . cefTRIAXone (ROCEPHIN)  IV 1 g (06/06/18 1557)     LOS: 3 days   Kristine [name]  [facility] 06/07/2018, 10:26 AM

## 2018-06-08 LAB — GLUCOSE, CAPILLARY
Glucose-Capillary: 79 mg/dL (ref 70–99)
Glucose-Capillary: 84 mg/dL (ref 70–99)
Glucose-Capillary: 124 mg/dL — ABNORMAL HIGH (ref 70–99)
Glucose-Capillary: 143 mg/dL — ABNORMAL HIGH (ref 70–99)
Glucose-Capillary: 176 mg/dL — ABNORMAL HIGH (ref 70–99)

## 2018-06-08 LAB — RENAL FUNCTION PANEL
ALBUMIN: 3.1 g/dL — AB (ref 3.5–5.0)
Anion gap: 6 (ref 5–15)
BUN: 38 mg/dL — ABNORMAL HIGH (ref 8–23)
CO2: 30 mmol/L (ref 22–32)
Calcium: 8.5 mg/dL — ABNORMAL LOW (ref 8.9–10.3)
Chloride: 105 mmol/L (ref 98–111)
Creatinine, Ser: 1.92 mg/dL — ABNORMAL HIGH (ref 0.44–1.00)
GFR calc Af Amer: 30 mL/min — ABNORMAL LOW (ref >60)
GFR calc non Af Amer: 26 mL/min — ABNORMAL LOW (ref >60)
Glucose, Bld: 99 mg/dL (ref 70–99)
PHOSPHORUS: 3.6 mg/dL (ref 2.5–4.6)
Potassium: 3.2 mmol/L — ABNORMAL LOW (ref 3.5–5.1)
Sodium: 141 mmol/L (ref 135–145)

## 2018-06-08 MED ORDER — POTASSIUM CHLORIDE CRYS ER 20 MEQ PO TBCR
40.0000 meq | EXTENDED_RELEASE_TABLET | Freq: Once | ORAL | Status: AC
  Administered 2018-06-08: 40 meq via ORAL
  Filled 2018-06-08: qty 2

## 2018-06-08 MED ORDER — LEVALBUTEROL HCL 0.63 MG/3ML IN NEBU
0.6300 mg | INHALATION_SOLUTION | Freq: Two times a day (BID) | RESPIRATORY_TRACT | Status: DC
  Filled 2018-06-08: qty 3

## 2018-06-08 MED ORDER — BUDESONIDE 0.25 MG/2ML IN SUSP
0.2500 mg | Freq: Two times a day (BID) | RESPIRATORY_TRACT | Status: DC
  Administered 2018-06-08: 0.25 mg via RESPIRATORY_TRACT
  Filled 2018-06-08 (×3): qty 2

## 2018-06-08 MED ORDER — LEVALBUTEROL HCL 0.63 MG/3ML IN NEBU
0.6300 mg | INHALATION_SOLUTION | Freq: Four times a day (QID) | RESPIRATORY_TRACT | Status: DC
  Administered 2018-06-08: 0.63 mg via RESPIRATORY_TRACT
  Filled 2018-06-08 (×2): qty 3

## 2018-06-08 MED ORDER — FUROSEMIDE 80 MG PO TABS
80.0000 mg | ORAL_TABLET | Freq: Three times a day (TID) | ORAL | Status: DC
  Administered 2018-06-08 – 2018-06-09 (×3): 80 mg via ORAL
  Filled 2018-06-08 (×3): qty 1

## 2018-06-08 NOTE — Progress Notes (Addendum)
[facility]  PROGRESS NOTE  Kristine [name] XTA:[id number] DOB: [birth year] DOA: 06/03/2018 PCP: Prince Solian, MD   Brief HPI:   73 year old female with a history of diabetes mellitus type 2, atrial fibrillation on anticoagulation with Xarelto, morbid obesity with BMI 41.28 kg/m, hypertension, hyperlipidemia, Charcot foot status post surgery on 02/11/2018 came to hospital with worsening shortness of breath.    Subjective   Patient is breathing better but not at baseline.  Still requiring oxygen.  Patient was not on oxygen at home.   Assessment/Plan:     1. Acute hypoxic respiratory failure-unclear etiology O2 sats dropped to 70s on ambulation.  Echocardiogram showed no PFO, EF was 45 to 50%, diffuse hypokinesis.  Mildly increased pulmonary artery pressure 43 mmHg.  Doubt pulmonary embolism as patient is on Xarelto and is compliant with medications.  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  At that time amiodarone was discontinued.  Patient is not interested in repeating CT chest at this time.  Will start Pulmicort nebulizer twice daily, Xopenex nebulizer every 6 hours.  2. Acute on chronic systolic CHF-patient was started on IV Lasix, diuresed very well with Lasix.  Net -3.6 L.  Patient was taking Lasix 80 mg 3 times daily at home.  Will switch to p.o. Lasix from today.  3. Hypertension-blood pressure stable, continue Toprol-XL, Norvasc on hold due to increased leg swelling.  4. Hyperlipidemia-continue Lipitor 20 mg daily  5. E. Coli-resistant to Cipro, Cipro was ordered in the ED for 3 days.  Changed to Rocephin for 2 days as patient is asymptomatic.  6. Atrial fibrillation-heart rate is controlled, continue Xarelto.  7. Hypokalemia-potassium is 3.2, will replace potassium and check BMP in a.m.  8. Stage IV CKD-creatinine improving after diuresis.  Follow BMP in a.m.  9. Diabetes mellitus type 2-blood glucose is controlled, continue  sliding scale insulin with NovoLog.     Labs for a.m :  CBG: Recent Labs  Lab 06/07/18 1628 06/07/18 2120 06/08/18 0636 06/08/18 0810 06/08/18 1212  GLUCAP 134* 171* 79 84 124*    CBC: Recent Labs  Lab 06/03/18 1117 06/04/18 0535  WBC 7.5 7.1  NEUTROABS 4.5 4.2  HGB 11.6* 11.1*  HCT 39.0 36.3  MCV 90.7 90.3  PLT 180 655    Basic Metabolic Panel: Recent Labs  Lab 06/04/18 0535 06/05/18 0503 06/06/18 0545 06/07/18 0405 06/08/18 0322  NA 142 142 143 142 141  K 3.0* 3.5 3.0* 4.0 3.2*  CL 101 101 102 102 105  CO2 29 27 31 31 30   GLUCOSE 138* 162* 130* 155* 99  BUN 59* 57* 50* 45* 38*  CREATININE 2.12* 2.09* 2.10* 1.96* 1.92*  CALCIUM 8.8* 9.0 8.8* 8.8* 8.5*  MG 2.3  --   --   --   --   PHOS  --   --   --  3.8 3.6     DVT prophylaxis: Xarelto  Code Status: DNR  Family Communication: Discussed with daughter at bedside  Disposition Plan: likely home when medically ready for discharge     Consultants:  None  Procedures:  None   Antibiotics:   Anti-infectives (From admission, onward)   Start     Dose/Rate Route Frequency Ordered Stop   06/05/18 1200  cefTRIAXone (ROCEPHIN) 1 g in sodium chloride 0.9 % 100 mL IVPB  Status:  Discontinued     1 g 200 mL/hr over 30 Minutes Intravenous Every 24 hours 06/05/18 1027 06/05/18 1027  06/05/18 1200  cefTRIAXone (ROCEPHIN) 1 g in sodium chloride 0.9 % 100 mL IVPB  Status:  Discontinued     1 g 200 mL/hr over 30 Minutes Intravenous Every 24 hours 06/05/18 1027 06/07/18 1029   06/05/18 1000  ciprofloxacin (CIPRO) tablet 250 mg  Status:  Discontinued     250 mg Oral 2 times daily 06/05/18 0851 06/05/18 1027   06/03/18 2200  ciprofloxacin (CIPRO) IVPB 200 mg  Status:  Discontinued     200 mg 100 mL/hr over 60 Minutes Intravenous Every 12 hours 06/03/18 1538 06/05/18 0851   06/03/18 1400  ciprofloxacin (CIPRO) IVPB 400 mg     400 mg 200 mL/hr over 60 Minutes Intravenous  Once 06/03/18 1357 06/03/18 1624        Objective   Vitals:   06/08/18 1024 06/08/18 1028 06/08/18 1216 06/08/18 1318  BP: 106/65  112/67 126/76  Pulse:  84 69 80  Resp: 18  20   Temp:   98.7 F (37.1 C) 97.7 F (36.5 C)  TempSrc:   Oral Oral  SpO2: 100%  98% 100%  Weight:      Height:        Intake/Output Summary (Last 24 hours) at 06/08/2018 1542 Last data filed at 06/08/2018 1100 Gross per 24 hour  Intake 480 ml  Output 2350 ml  Net -1870 ml   Filed Weights   06/06/18 1400 06/07/18 0434 06/08/18 0436  Weight: 125.5 kg 125 kg 124.7 kg     Physical Examination:    General: Appears in no acute distress  Cardiovascular: S1-S2, regular, no murmur auscultated  Respiratory: Decreased breath sounds bilaterally  Abdomen: Abdomen is soft, nontender, no organomegaly  Extremities: No edema of the lower extremities  Neurologic: Alert, oriented x3, no focal deficit noted     Data Reviewed: I have personally reviewed following labs and imaging studies   Recent Results (from the past 240 hour(s))  Urine culture     Status: Abnormal   Collection Time: 06/03/18 12:53 PM  Result Value Ref Range Status   Specimen Description URINE, CATHETERIZED  Final   Special Requests   Final    NONE Performed at [facility], [address], [place], [place] [address]    Culture >=100,000 COLONIES/mL ESCHERICHIA COLI (A)  Final   Report Status 06/05/2018 FINAL  Final   Organism ID, Bacteria ESCHERICHIA COLI (A)  Final      Susceptibility   Escherichia coli - MIC*    AMPICILLIN >=32 RESISTANT Resistant     CEFAZOLIN <=4 SENSITIVE Sensitive     CEFTRIAXONE <=1 SENSITIVE Sensitive     CIPROFLOXACIN >=4 RESISTANT Resistant     GENTAMICIN <=1 SENSITIVE Sensitive     IMIPENEM <=0.25 SENSITIVE Sensitive     NITROFURANTOIN <=16 SENSITIVE Sensitive     TRIMETH/SULFA >=320 RESISTANT Resistant     AMPICILLIN/SULBACTAM >=32 RESISTANT Resistant     PIP/TAZO <=4 SENSITIVE Sensitive     Extended ESBL NEGATIVE  Sensitive     * >=100,000 COLONIES/mL ESCHERICHIA COLI     Liver Function Tests: Recent Labs  Lab 06/07/18 0405 06/08/18 0322  ALBUMIN 3.1* 3.1*   No results for input(s): LIPASE, AMYLASE in the last 168 hours. No results for input(s): AMMONIA in the last 168 hours.  Cardiac Enzymes: Recent Labs  Lab 06/03/18 1117  TROPONINI <0.03   BNP (last 3 results) Recent Labs    06/03/18 1117  BNP 198.3*    ProBNP (last 3  results) No results for input(s): PROBNP in the last 8760 hours.    Studies: No results found.  Scheduled Meds: . aspirin EC  81 mg Oral Daily  . atorvastatin  20 mg Oral q1800  . budesonide (PULMICORT) nebulizer solution  0.25 mg Nebulization BID  . docusate sodium  200 mg Oral BID  . febuxostat  80 mg Oral Daily  . furosemide  80 mg Intravenous Q8H  . insulin aspart  0-15 Units Subcutaneous TID WC  . insulin aspart  0-5 Units Subcutaneous QHS  . insulin glargine  96 Units Subcutaneous BH-q7a  . levalbuterol  0.63 mg Nebulization Q6H  . levothyroxine  50 mcg Oral Q0600  . metoprolol succinate  50 mg Oral BID  . potassium chloride SA  20 mEq Oral BID  . pregabalin  150 mg Oral QHS  . pregabalin  50 mg Oral Daily  . pregabalin  50 mg Oral Q24H  . Rivaroxaban  15 mg Oral Q supper  . sodium chloride flush  3 mL Intravenous Q12H    Admission status: Inpatient: Based on patients clinical presentation and evaluation of above clinical data, I have made determination that patient meets Inpatient criteria at this time.  Time spent: 30 min  [facility] Pager [phone]. If 7PM-7AM, please contact night-coverage at [email or web address], Office  [phone]  password [email or web address]  06/08/2018, 3:42 PM  LOS: 4 days

## 2018-06-08 NOTE — Progress Notes (Signed)
To the best of my knowledge, documentation by Peggyann Shoals is correct and complete.

## 2018-06-08 NOTE — Progress Notes (Signed)
Occupational Therapy Treatment Patient Details Name: Kristine [name] MRN: [id number] DOB: 17-Oct-1945 Today's Date: 06/08/2018    History of present illness 73 y.o. female with medical history significant of DM; afib on Xarelto; morbid obesity; HTN; HLD; Charcot foot; and CHF presenting with SOB. Recently left foot sx and was receiving HH OT/PT. On 06/03/18, pt SpO2 dropping to 79% persistently with therapy and EMS was called.    OT comments  Pt progressing towards OT goals this session. Focus was on energy conservation, transfers and LB ADL - specifically managing boot, shoe, and socks/stockings. Pt able to perform transfers at min A, mod to max A for LB dressing with stockings (unable to get over heels) but able to don/doff boot and shoe with riser. Pt able to perform figure 4 position for bathing feet and applying lotion to feet. OT will continue to follow acutely, and current POC remains appropriate.    Follow Up Recommendations  Home health OT;Supervision/Assistance - 24 hour(requests resuming with [facility])    Equipment Recommendations  None recommended by OT    Recommendations for Other Services PT consult    Precautions / Restrictions Precautions Precautions: Fall Precaution Comments: Watch O2 Required Braces or Orthoses: Other Brace Other Brace: Left boot, R foot elevator to match L foot Restrictions Weight Bearing Restrictions: Yes LLE Weight Bearing: Partial weight bearing LLE Partial Weight Bearing Percentage or Pounds: 50 Other Position/Activity Restrictions: Wear boot       Mobility Bed Mobility Overal bed mobility: Needs Assistance Bed Mobility: Sit to Supine       Sit to supine: Supervision      Transfers Overall transfer level: Needs assistance Equipment used: Rolling walker (2 wheeled) Transfers: Sit to/from [facility] Sit to Stand: Min guard Stand pivot transfers: Min guard       General transfer comment: for safety     Balance Overall balance assessment: Needs assistance Sitting-balance support: No upper extremity supported;Feet supported Sitting balance-Leahy Scale: Fair Sitting balance - Comments: able to perform dynamic balance tasks in seated position   Standing balance support: Bilateral upper extremity supported;During functional activity Standing balance-Leahy Scale: Poor Standing balance comment: Reliant on BUE support                           ADL either performed or assessed with clinical judgement   ADL Overall ADL's : Needs assistance/impaired             Lower Body Bathing: Min guard;Sitting/lateral leans Lower Body Bathing Details (indicate cue type and reason): to wash feet sitting EOB, able to perform figure 4 technique     Lower Body Dressing: Moderate assistance;Sitting/lateral leans Lower Body Dressing Details (indicate cue type and reason): Pt able to doff boot/shoe, required assistance for socks/stockings Toilet Transfer: Min Statistician Details (indicate cue type and reason): from [facility] to the bed         Functional mobility during ADLs: Min guard;Rolling walker       Vision       Perception     Praxis      Cognition Arousal/Alertness: Awake/alert Behavior During Therapy: WFL for tasks assessed/performed Overall Cognitive Status: Within Functional Limits for tasks assessed                                          Exercises  Shoulder Instructions       General Comments Daughter present throughout session    Pertinent Vitals/ Pain       Pain Assessment: No/denies pain Pain Intervention(s): Monitored during session  Home Living                                          Prior Functioning/Environment              Frequency  Min 2X/week        Progress Toward Goals  OT Goals(current goals can now be found in the care plan section)  Progress towards OT goals:  Progressing toward goals  Acute Rehab OT Goals Patient Stated Goal: "I really don't want to go home with oxygen" OT Goal Formulation: With patient/family Time For Goal Achievement: 06/18/18 Potential to Achieve Goals: Good  Plan Discharge plan remains appropriate    Co-evaluation                 AM-PAC OT "6 Clicks" Daily Activity     Outcome Measure   Help from another person eating meals?: None Help from another person taking care of personal grooming?: A Little Help from another person toileting, which includes using toliet, bedpan, or urinal?: A Little Help from another person bathing (including washing, rinsing, drying)?: A Little Help from another person to put on and taking off regular upper body clothing?: None Help from another person to put on and taking off regular lower body clothing?: A Lot 6 Click Score: 19    End of Session Equipment Utilized During Treatment: Rolling walker;Oxygen  OT Visit Diagnosis: Unsteadiness on feet (R26.81);Other abnormalities of gait and mobility (R26.89);Muscle weakness (generalized) (M62.81)   Activity Tolerance Patient tolerated treatment well   Patient Left in bed;with call bell/phone within reach;with nursing/sitter in room;with family/visitor present   Nurse Communication Mobility status;Weight bearing status        Time: 1042-1110 OT Time Calculation (min): 28 min  Charges: OT General Charges $OT Visit: 1 Visit OT Treatments $Self Care/Home Management : 23-37 mins  [place] Pager: [phone] Office: [place] 06/08/2018, 11:52 AM

## 2018-06-09 LAB — GLUCOSE, CAPILLARY: Glucose-Capillary: 146 mg/dL — ABNORMAL HIGH (ref 70–99)

## 2018-06-09 LAB — BASIC METABOLIC PANEL
Anion gap: 10 (ref 5–15)
BUN: 37 mg/dL — ABNORMAL HIGH (ref 8–23)
CO2: 27 mmol/L (ref 22–32)
CREATININE: 2.05 mg/dL — AB (ref 0.44–1.00)
Calcium: 9 mg/dL (ref 8.9–10.3)
Chloride: 102 mmol/L (ref 98–111)
GFR calc Af Amer: 28 mL/min — ABNORMAL LOW (ref >60)
GFR, EST NON AFRICAN AMERICAN: 24 mL/min — AB (ref >60)
Glucose, Bld: 176 mg/dL — ABNORMAL HIGH (ref 70–99)
Potassium: 4 mmol/L (ref 3.5–5.1)
Sodium: 139 mmol/L (ref 135–145)

## 2018-06-09 MED ORDER — BUDESONIDE 0.25 MG/2ML IN SUSP: 0.2500 mg | Freq: Two times a day (BID) | RESPIRATORY_TRACT | 12 refills | Status: DC

## 2018-06-09 MED ORDER — LEVALBUTEROL HCL 0.63 MG/3ML IN NEBU: 0.6300 mg | INHALATION_SOLUTION | Freq: Three times a day (TID) | RESPIRATORY_TRACT | 0 refills | Status: DC | PRN

## 2018-06-09 NOTE — Discharge Summary (Signed)
Physician Discharge Summary  Kristine [name] NUU:[id number] DOB: 02/26/1946 DOA: 06/03/2018  PCP: Prince Solian, MD  Admit date: 06/03/2018 Discharge date: 06/09/2018  Time spent: 40 minutes  Recommendations for Outpatient Follow-up:  1. Follow-up PCP in 2 weeks   Discharge Diagnoses:  Principal Problem:   Acute on chronic systolic CHF (congestive heart failure) (HCC) Active Problems:   Diabetes mellitus, type 2 (HCC)   High cholesterol   Atrial fibrillation (HCC)   CKD (chronic kidney disease) stage 4, GFR 15-29 ml/min (HCC)   CHF exacerbation ([place])   Discharge Condition: Stable  Diet recommendation: Heart healthy diet  Filed Weights   06/07/18 0434 06/08/18 0436 06/09/18 0619  Weight: 125 kg 124.7 kg 125.2 kg    History of present illness:  73 year old female with a history of diabetes mellitus type 2, atrial fibrillation on anticoagulation with Xarelto, morbid obesity with BMI 41.28 kg/m, hypertension, hyperlipidemia, Charcot foot status post surgery on 02/11/2018 came to hospital with worsening shortness of breath.  Hospital Course:   1. Acute hypoxic respiratory failure-resolved, patient presented with shortness of breath,  O2 sats dropped to 70s on ambulation.  Echocardiogram showed no PFO, EF was 45 to 50%, diffuse hypokinesis.  Mildly increased pulmonary artery pressure 43 mmHg.  Doubt pulmonary embolism as patient is on Xarelto and is compliant with medications.  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  At that time amiodarone was discontinued.  Patient is not interested in repeating CT chest at this time.    Started on Pulmicort nebulizer twice daily, Xopenex nebulizer every 6 hours.  Patient's breathing has improved, she is not requiring oxygen at this time.  We will continue with Pulmicort nebulizer twice a day, Xopenex nebulizer 8 hours as needed.  I have told the patient to follow-up with her pulmonologist as  outpatient.  2. Acute on chronic systolic CHF-patient was started on IV Lasix, diuresed very well with Lasix.  Net -3.8 L.  Patient was taking Lasix 80 mg 3 times daily at home.  Will switch to p.o. Lasix at her home dose.  3. Hypertension-blood pressure stable, continue Toprol-XL, Norvasc has been discontinued due to increased leg swelling.  Blood pressure is stable.  4. E. Coli-resistant to Cipro, Cipro was ordered in the ED for 3 days.  Changed to Rocephin for 2 days as patient is asymptomatic.  She has been afebrile.  6. Atrial fibrillation-heart rate is controlled, continue Xarelto.  7. Hypokalemia-replete  8. Stage IV CKD-creatinine improving after diuresis.  Follow BMP in a.m.  9. Diabetes mellitus type 2-blood glucose is controlled, continue [place]   Procedures:  None   Consultations:    Discharge Exam: Vitals:   06/08/18 2043 06/09/18 0619  BP: 97/60 115/69  Pulse: 70 74  Resp: 20 16  Temp: 97.8 F (36.6 C) 97.9 F (36.6 C)  SpO2: 95% 93%    General: Appears in no acute distress Cardiovascular: S1-S2, regular, no murmur auscultated Respiratory: Clear to auscultation bilaterally  Discharge Instructions   Discharge Instructions    Diet - low sodium heart healthy   Complete by:  As directed    Increase activity slowly   Complete by:  As directed      Allergies as of 06/09/2018      Reactions   Albuterol Other (See Comments)   REACTION: Tachycardia- AFib   Epinephrine Other (See Comments)   Increases heart rate per patient.    Penicillins Other (See Comments)   REACTION: flushing \  T\ hot Did it involve swelling of the face/tongue/throat, SOB, or low BP? No Did it involve sudden or severe rash/hives, skin peeling, or any reaction on the inside of your mouth or nose? No Did you need to seek medical attention at a hospital or doctor's office? No When did it last happen?20 years ago If all above answers are "NO", may proceed with  cephalosporin use.   Torsemide Swelling   Bee Venom Other (See Comments)   "makes me nervous"   Ivp Dye [iodinated Diagnostic Agents] Other (See Comments)   flushing      Medication List    STOP taking these medications   amLODipine 5 MG tablet Commonly known as:  NORVASC     TAKE these medications   ALPRAZolam 0.25 MG tablet Commonly known as:  XANAX Take 0.125-0.25 mg by mouth 2 (two) times daily as needed for anxiety.   BD Pen Needle Nano U/F 32G X 4 MM Misc Generic drug:  Insulin Pen Needle Inject 1 each as directed See admin instructions. Use pen needles with insulin pens daily   budesonide 0.25 MG/2ML nebulizer solution Commonly known as:  PULMICORT Take 2 mLs (0.25 mg total) by nebulization 2 (two) times daily.   clindamycin 300 MG capsule Commonly known as:  CLEOCIN Take 300 mg by mouth once.   estradiol 0.1 MG/GM vaginal cream Commonly known as:  ESTRACE Place 1 Applicatorful vaginally as needed (Estrace).   furosemide 80 MG tablet Commonly known as:  LASIX Take 80 mg by mouth 3 (three) times daily.   Klor-Con M20 20 MEQ tablet Generic drug:  potassium chloride SA Take 60 mg by mouth 2 (two) times daily.   levalbuterol 0.63 MG/3ML nebulizer solution Commonly known as:  XOPENEX Take 3 mLs (0.63 mg total) by nebulization every 8 (eight) hours as needed for up to 30 days for wheezing or shortness of breath.   levothyroxine 50 MCG tablet Commonly known as:  SYNTHROID, LEVOTHROID Take 50 mcg by mouth daily.   Lyrica 50 MG capsule Generic drug:  pregabalin Take 50-150 mg by mouth See admin instructions. Take 50 mg in the morning 50 gm at lunch ups to 150 at bedtime   metoprolol succinate 50 MG 24 hr tablet Commonly known as:  TOPROL-XL TAKE 1 AND 1/2 TABLET (75MG ) BY MOUTH TWICE A DAY What changed:  See the new instructions.   NovoLOG FlexPen 100 UNIT/ML FlexPen Generic drug:  insulin aspart Inject 30 Units into the skin 3 (three) times daily with  meals. Plus sliding scale   Tresiba FlexTouch 200 UNIT/ML Sopn Generic drug:  Insulin Degludec Inject 96 Units into the skin every morning.   Uloric 80 MG Tabs Generic drug:  Febuxostat Take 1 tablet by mouth daily.   Vitamin D (Ergocalciferol) 1.25 MG (50000 UT) Caps capsule Commonly known as:  DRISDOL Take 50,000 Units by mouth every 7 (seven) days. Takes on Thursday   Xarelto 15 MG Tabs tablet Generic drug:  Rivaroxaban Take 15 mg by mouth daily with supper.            Durable Medical Equipment  (From admission, onward)         Start     Ordered   06/06/18 1308  For home use only DME oxygen  Once    Question Answer Comment  Mode or (Route) Nasal cannula   Liters per Minute 2   Frequency Continuous (stationary and portable oxygen unit needed)   Oxygen conserving device Yes  Oxygen delivery system Gas      06/06/18 1307         Allergies  Allergen Reactions  . Albuterol Other (See Comments)    REACTION: Tachycardia- AFib  . Epinephrine Other (See Comments)    Increases heart rate per patient.   . Penicillins Other (See Comments)    REACTION: flushing \\T \ hot  Did it involve swelling of the face/tongue/throat, SOB, or low BP? No Did it involve sudden or severe rash/hives, skin peeling, or any reaction on the inside of your mouth or nose? No Did you need to seek medical attention at a hospital or doctor's office? No When did it last happen?20 years ago If all above answers are "NO", may proceed with cephalosporin use.  . Torsemide Swelling  . Bee Venom Other (See Comments)    "makes me nervous"  . Ivp Dye [Iodinated Diagnostic Agents] Other (See Comments)    flushing   Follow-up Information    [facility].   Specialty:  Home Health Services Why:  They will do your home health care at your home           The results of significant diagnostics from this hospitalization (including imaging, microbiology,  ancillary and laboratory) are listed below for reference.    Significant Diagnostic Studies: Dg Chest 2 View  Result Date: 06/03/2018 CLINICAL DATA:  Shortness of breath EXAM: CHEST - 2 VIEW COMPARISON:  08/21/2017 and prior radiographs FINDINGS: Cardiomegaly with pulmonary vascular congestion noted. Mild interstitial opacities are identified and may represent mild interstitial edema. No pleural effusion or pneumothorax. No acute bony abnormality identified. IMPRESSION: Cardiomegaly with pulmonary vascular congestion. Mild interstitial opacities are nonspecific but may represent mild interstitial edema. Electronically Signed   By: Margarette Canada M.D.   On: 06/03/2018 12:00    Microbiology: Recent Results (from the past 240 hour(s))  Urine culture     Status: Abnormal   Collection Time: 06/03/18 12:53 PM  Result Value Ref Range Status   Specimen Description URINE, CATHETERIZED  Final   Special Requests   Final    NONE Performed at [facility], [address], [place], [place] [address]    Culture >=100,000 COLONIES/mL ESCHERICHIA COLI (A)  Final   Report Status 06/05/2018 FINAL  Final   Organism ID, Bacteria ESCHERICHIA COLI (A)  Final      Susceptibility   Escherichia coli - MIC*    AMPICILLIN >=32 RESISTANT Resistant     CEFAZOLIN <=4 SENSITIVE Sensitive     CEFTRIAXONE <=1 SENSITIVE Sensitive     CIPROFLOXACIN >=4 RESISTANT Resistant     GENTAMICIN <=1 SENSITIVE Sensitive     IMIPENEM <=0.25 SENSITIVE Sensitive     NITROFURANTOIN <=16 SENSITIVE Sensitive     TRIMETH/SULFA >=320 RESISTANT Resistant     AMPICILLIN/SULBACTAM >=32 RESISTANT Resistant     PIP/TAZO <=4 SENSITIVE Sensitive     Extended ESBL NEGATIVE Sensitive     * >=100,000 COLONIES/mL ESCHERICHIA COLI     Labs: Basic Metabolic Panel: Recent Labs  Lab 06/04/18 0535 06/05/18 0503 06/06/18 0545 06/07/18 0405 06/08/18 0322 06/09/18 0427  NA 142 142 143 142 141 139  K 3.0* 3.5 3.0* 4.0 3.2* 4.0  CL 101  101 102 102 105 102  CO2 29 27 31 31 30 27   GLUCOSE 138* 162* 130* 155* 99 176*  BUN 59* 57* 50* 45* 38* 37*  CREATININE 2.12* 2.09* 2.10* 1.96* 1.92* 2.05*  CALCIUM 8.8* 9.0 8.8*  8.8* 8.5* 9.0  MG 2.3  --   --   --   --   --   PHOS  --   --   --  3.8 3.6  --    Liver Function Tests: Recent Labs  Lab 06/07/18 0405 06/08/18 0322  ALBUMIN 3.1* 3.1*   No results for input(s): LIPASE, AMYLASE in the last 168 hours. No results for input(s): AMMONIA in the last 168 hours. CBC: Recent Labs  Lab 06/03/18 1117 06/04/18 0535  WBC 7.5 7.1  NEUTROABS 4.5 4.2  HGB 11.6* 11.1*  HCT 39.0 36.3  MCV 90.7 90.3  PLT 180 170   Cardiac Enzymes: Recent Labs  Lab 06/03/18 1117  TROPONINI <0.03   BNP: BNP (last 3 results) Recent Labs    06/03/18 1117  BNP 198.3*    ProBNP (last 3 results) No results for input(s): PROBNP in the last 8760 hours.  CBG: Recent Labs  Lab 06/08/18 0810 06/08/18 1212 06/08/18 1650 06/08/18 2120 06/09/18 0616  GLUCAP 84 124* 143* 176* 146*       Signed:  Oswald Hillock MD.  [facility] 06/09/2018, 10:44 AM

## 2018-06-09 NOTE — Progress Notes (Signed)
   06/09/18 1000  Mobility  Activity Ambulated in hall;Dangled on edge of bed  Range of Motion Active;All extremities  Level of Assistance Modified independent, requires aide device or extra time  Assistive Device Front wheel walker  LLE Weight Bearing PWB  LLE Partial Weight Bearing Percentage or Pounds 50  Minutes Stood 4 minutes  Minutes Ambulated 4 minutes  Distance Ambulated (ft) 80 ft  Mobility Response Tolerated well  Bed Position Semi-fowlers  Nutrition  Patient's Current Diet Carb modified   SATURATION QUALIFICATIONS: (This note is used to comply with regulatory documentation for home oxygen)  Patient Saturations on Room Air at Rest = 93%  Patient Saturations on Room Air while Ambulating = 90%  Patient Saturations on N/A Liters of oxygen while Ambulating = N/A%  Please briefly explain why patient needs home oxygen: Patient doesn't qualify for home O2

## 2018-06-10 ENCOUNTER — Telehealth: Payer: Self-pay | Admitting: Internal Medicine

## 2018-06-10 DIAGNOSIS — R54 Age-related physical debility: Secondary | ICD-10-CM | POA: Diagnosis not present

## 2018-06-10 DIAGNOSIS — E1140 Type 2 diabetes mellitus with diabetic neuropathy, unspecified: Secondary | ICD-10-CM | POA: Diagnosis not present

## 2018-06-10 DIAGNOSIS — M1990 Unspecified osteoarthritis, unspecified site: Secondary | ICD-10-CM | POA: Diagnosis not present

## 2018-06-10 DIAGNOSIS — R2689 Other abnormalities of gait and mobility: Secondary | ICD-10-CM | POA: Diagnosis not present

## 2018-06-10 DIAGNOSIS — R69 Illness, unspecified: Secondary | ICD-10-CM | POA: Diagnosis not present

## 2018-06-10 NOTE — Care Management (Signed)
CM received call from Dr Bari Mantis office assistant Denman George informing CM that pt has not yet received nebulizer.  CM confirmed that nebulizer was not ordered prior to discharge  [facility] request that Dr Bari Mantis office fax order to main office and equipment will be delivered.  CM attempted to reach pt to confirm all other equipment has been delivered/in operating order however was unable to do so.  CM contacted Dr. Bari Mantis assistant and requested nebulizer order be faxed to [facility].  CM signing off

## 2018-06-13 DIAGNOSIS — E785 Hyperlipidemia, unspecified: Secondary | ICD-10-CM | POA: Diagnosis not present

## 2018-06-13 DIAGNOSIS — N184 Chronic kidney disease, stage 4 (severe): Secondary | ICD-10-CM | POA: Diagnosis not present

## 2018-06-13 DIAGNOSIS — I130 Hypertensive heart and chronic kidney disease with heart failure and stage 1 through stage 4 chronic kidney disease, or unspecified chronic kidney disease: Secondary | ICD-10-CM | POA: Diagnosis not present

## 2018-06-13 DIAGNOSIS — E11610 Type 2 diabetes mellitus with diabetic neuropathic arthropathy: Secondary | ICD-10-CM | POA: Diagnosis not present

## 2018-06-13 DIAGNOSIS — Z6841 Body Mass Index (BMI) 40.0 and over, adult: Secondary | ICD-10-CM | POA: Diagnosis not present

## 2018-06-13 DIAGNOSIS — Z4789 Encounter for other orthopedic aftercare: Secondary | ICD-10-CM | POA: Diagnosis not present

## 2018-06-13 DIAGNOSIS — I5023 Acute on chronic systolic (congestive) heart failure: Secondary | ICD-10-CM | POA: Diagnosis not present

## 2018-06-13 DIAGNOSIS — R69 Illness, unspecified: Secondary | ICD-10-CM | POA: Diagnosis not present

## 2018-06-13 DIAGNOSIS — E1122 Type 2 diabetes mellitus with diabetic chronic kidney disease: Secondary | ICD-10-CM | POA: Diagnosis not present

## 2018-06-13 DIAGNOSIS — E7849 Other hyperlipidemia: Secondary | ICD-10-CM | POA: Diagnosis not present

## 2018-06-13 DIAGNOSIS — I4811 Longstanding persistent atrial fibrillation: Secondary | ICD-10-CM | POA: Diagnosis not present

## 2018-06-14 DIAGNOSIS — E11610 Type 2 diabetes mellitus with diabetic neuropathic arthropathy: Secondary | ICD-10-CM | POA: Diagnosis not present

## 2018-06-14 DIAGNOSIS — I480 Paroxysmal atrial fibrillation: Secondary | ICD-10-CM | POA: Diagnosis not present

## 2018-06-14 DIAGNOSIS — N390 Urinary tract infection, site not specified: Secondary | ICD-10-CM | POA: Diagnosis not present

## 2018-06-14 DIAGNOSIS — E1159 Type 2 diabetes mellitus with other circulatory complications: Secondary | ICD-10-CM | POA: Diagnosis not present

## 2018-06-14 DIAGNOSIS — I129 Hypertensive chronic kidney disease with stage 1 through stage 4 chronic kidney disease, or unspecified chronic kidney disease: Secondary | ICD-10-CM | POA: Diagnosis not present

## 2018-06-14 DIAGNOSIS — S60511A Abrasion of right hand, initial encounter: Secondary | ICD-10-CM | POA: Diagnosis not present

## 2018-06-14 DIAGNOSIS — R69 Illness, unspecified: Secondary | ICD-10-CM | POA: Diagnosis not present

## 2018-06-14 DIAGNOSIS — I5023 Acute on chronic systolic (congestive) heart failure: Secondary | ICD-10-CM | POA: Diagnosis not present

## 2018-06-14 DIAGNOSIS — E876 Hypokalemia: Secondary | ICD-10-CM | POA: Diagnosis not present

## 2018-06-14 DIAGNOSIS — E1122 Type 2 diabetes mellitus with diabetic chronic kidney disease: Secondary | ICD-10-CM | POA: Diagnosis not present

## 2018-06-14 DIAGNOSIS — I509 Heart failure, unspecified: Secondary | ICD-10-CM | POA: Diagnosis not present

## 2018-06-14 DIAGNOSIS — E1165 Type 2 diabetes mellitus with hyperglycemia: Secondary | ICD-10-CM | POA: Diagnosis not present

## 2018-06-14 DIAGNOSIS — I4811 Longstanding persistent atrial fibrillation: Secondary | ICD-10-CM | POA: Diagnosis not present

## 2018-06-14 DIAGNOSIS — E785 Hyperlipidemia, unspecified: Secondary | ICD-10-CM | POA: Diagnosis not present

## 2018-06-14 DIAGNOSIS — Z6841 Body Mass Index (BMI) 40.0 and over, adult: Secondary | ICD-10-CM | POA: Diagnosis not present

## 2018-06-14 DIAGNOSIS — N184 Chronic kidney disease, stage 4 (severe): Secondary | ICD-10-CM | POA: Diagnosis not present

## 2018-06-14 DIAGNOSIS — I130 Hypertensive heart and chronic kidney disease with heart failure and stage 1 through stage 4 chronic kidney disease, or unspecified chronic kidney disease: Secondary | ICD-10-CM | POA: Diagnosis not present

## 2018-06-14 DIAGNOSIS — J9601 Acute respiratory failure with hypoxia: Secondary | ICD-10-CM | POA: Diagnosis not present

## 2018-06-16 DIAGNOSIS — I4811 Longstanding persistent atrial fibrillation: Secondary | ICD-10-CM | POA: Diagnosis not present

## 2018-06-16 DIAGNOSIS — N184 Chronic kidney disease, stage 4 (severe): Secondary | ICD-10-CM | POA: Diagnosis not present

## 2018-06-16 DIAGNOSIS — Z6841 Body Mass Index (BMI) 40.0 and over, adult: Secondary | ICD-10-CM | POA: Diagnosis not present

## 2018-06-16 DIAGNOSIS — I130 Hypertensive heart and chronic kidney disease with heart failure and stage 1 through stage 4 chronic kidney disease, or unspecified chronic kidney disease: Secondary | ICD-10-CM | POA: Diagnosis not present

## 2018-06-16 DIAGNOSIS — R69 Illness, unspecified: Secondary | ICD-10-CM | POA: Diagnosis not present

## 2018-06-16 DIAGNOSIS — E785 Hyperlipidemia, unspecified: Secondary | ICD-10-CM | POA: Diagnosis not present

## 2018-06-16 DIAGNOSIS — I5023 Acute on chronic systolic (congestive) heart failure: Secondary | ICD-10-CM | POA: Diagnosis not present

## 2018-06-16 DIAGNOSIS — E11610 Type 2 diabetes mellitus with diabetic neuropathic arthropathy: Secondary | ICD-10-CM | POA: Diagnosis not present

## 2018-06-16 DIAGNOSIS — E1122 Type 2 diabetes mellitus with diabetic chronic kidney disease: Secondary | ICD-10-CM | POA: Diagnosis not present

## 2018-06-17 DIAGNOSIS — I5023 Acute on chronic systolic (congestive) heart failure: Secondary | ICD-10-CM | POA: Diagnosis not present

## 2018-06-17 DIAGNOSIS — E1122 Type 2 diabetes mellitus with diabetic chronic kidney disease: Secondary | ICD-10-CM | POA: Diagnosis not present

## 2018-06-17 DIAGNOSIS — I4811 Longstanding persistent atrial fibrillation: Secondary | ICD-10-CM | POA: Diagnosis not present

## 2018-06-17 DIAGNOSIS — Z6841 Body Mass Index (BMI) 40.0 and over, adult: Secondary | ICD-10-CM | POA: Diagnosis not present

## 2018-06-17 DIAGNOSIS — R69 Illness, unspecified: Secondary | ICD-10-CM | POA: Diagnosis not present

## 2018-06-17 DIAGNOSIS — E11610 Type 2 diabetes mellitus with diabetic neuropathic arthropathy: Secondary | ICD-10-CM | POA: Diagnosis not present

## 2018-06-17 DIAGNOSIS — I130 Hypertensive heart and chronic kidney disease with heart failure and stage 1 through stage 4 chronic kidney disease, or unspecified chronic kidney disease: Secondary | ICD-10-CM | POA: Diagnosis not present

## 2018-06-17 DIAGNOSIS — E785 Hyperlipidemia, unspecified: Secondary | ICD-10-CM | POA: Diagnosis not present

## 2018-06-17 DIAGNOSIS — N184 Chronic kidney disease, stage 4 (severe): Secondary | ICD-10-CM | POA: Diagnosis not present

## 2018-06-20 DIAGNOSIS — E1122 Type 2 diabetes mellitus with diabetic chronic kidney disease: Secondary | ICD-10-CM | POA: Diagnosis not present

## 2018-06-20 DIAGNOSIS — I130 Hypertensive heart and chronic kidney disease with heart failure and stage 1 through stage 4 chronic kidney disease, or unspecified chronic kidney disease: Secondary | ICD-10-CM | POA: Diagnosis not present

## 2018-06-20 DIAGNOSIS — R69 Illness, unspecified: Secondary | ICD-10-CM | POA: Diagnosis not present

## 2018-06-20 DIAGNOSIS — E11610 Type 2 diabetes mellitus with diabetic neuropathic arthropathy: Secondary | ICD-10-CM | POA: Diagnosis not present

## 2018-06-20 DIAGNOSIS — I5023 Acute on chronic systolic (congestive) heart failure: Secondary | ICD-10-CM | POA: Diagnosis not present

## 2018-06-20 DIAGNOSIS — E785 Hyperlipidemia, unspecified: Secondary | ICD-10-CM | POA: Diagnosis not present

## 2018-06-20 DIAGNOSIS — N184 Chronic kidney disease, stage 4 (severe): Secondary | ICD-10-CM | POA: Diagnosis not present

## 2018-06-20 DIAGNOSIS — Z6841 Body Mass Index (BMI) 40.0 and over, adult: Secondary | ICD-10-CM | POA: Diagnosis not present

## 2018-06-20 DIAGNOSIS — I4811 Longstanding persistent atrial fibrillation: Secondary | ICD-10-CM | POA: Diagnosis not present

## 2018-06-22 ENCOUNTER — Encounter: Payer: Self-pay | Admitting: Acute Care

## 2018-06-22 ENCOUNTER — Ambulatory Visit (INDEPENDENT_AMBULATORY_CARE_PROVIDER_SITE_OTHER): Payer: Medicare HMO | Admitting: Acute Care

## 2018-06-22 ENCOUNTER — Other Ambulatory Visit: Payer: Self-pay

## 2018-06-22 DIAGNOSIS — E1122 Type 2 diabetes mellitus with diabetic chronic kidney disease: Secondary | ICD-10-CM | POA: Diagnosis not present

## 2018-06-22 DIAGNOSIS — I5043 Acute on chronic combined systolic (congestive) and diastolic (congestive) heart failure: Secondary | ICD-10-CM

## 2018-06-22 DIAGNOSIS — I5032 Chronic diastolic (congestive) heart failure: Secondary | ICD-10-CM

## 2018-06-22 DIAGNOSIS — I130 Hypertensive heart and chronic kidney disease with heart failure and stage 1 through stage 4 chronic kidney disease, or unspecified chronic kidney disease: Secondary | ICD-10-CM

## 2018-06-22 DIAGNOSIS — I5022 Chronic systolic (congestive) heart failure: Secondary | ICD-10-CM

## 2018-06-22 DIAGNOSIS — R0600 Dyspnea, unspecified: Secondary | ICD-10-CM | POA: Diagnosis not present

## 2018-06-22 DIAGNOSIS — N184 Chronic kidney disease, stage 4 (severe): Secondary | ICD-10-CM

## 2018-06-22 DIAGNOSIS — I480 Paroxysmal atrial fibrillation: Secondary | ICD-10-CM | POA: Diagnosis not present

## 2018-06-22 DIAGNOSIS — Z7901 Long term (current) use of anticoagulants: Secondary | ICD-10-CM | POA: Diagnosis not present

## 2018-06-22 DIAGNOSIS — I509 Heart failure, unspecified: Secondary | ICD-10-CM | POA: Diagnosis not present

## 2018-06-22 DIAGNOSIS — M79604 Pain in right leg: Secondary | ICD-10-CM | POA: Diagnosis not present

## 2018-06-22 NOTE — Patient Instructions (Signed)
   Assessment and Plan: Acute on Chronic Systolic/ Diastolic Heart Failure Weight gain despite diuretics ? Drop in saturation today, No dyspnea  Plan Take Metolazone 2.5 mg today , and Friday and Sunday this week as you were directed by [facility] This is a diuretic, and will help get rid of the additional fluid you are retaining. Low salt diet, no more than 1500 mg of salt per day. Avoid canned food if able, as they are especially high in sodium Weigh yourself daily upon awakening after first am void.  Call me in the morning at the number I gave you if your weight continues to climb despite additional diuretic tonight. If your weight is up tomorrow we will have you come in for a CXR and we will assess labs. We will refer [facility] to [facility] Failure Clinic at [facility] to assist with management of your heart Failure. Continue your Pulmicort nebs twice daily as you have been doing Continue your Xopenex nebs as needed for breakthrough shortness of breath. Continue Lasix 80 mg TID as ordered previously We will schedule Pulmonary Function Tests once it is safe to come into the office We will consider Pulmonary Rehab once it is safe to come into the office      Follow Up Instructions: Monday June 27, 2018 at 10 am

## 2018-06-22 NOTE — Progress Notes (Signed)
Virtual Visit via Telephone Note  I connected with Kristine [name] on 06/22/18 at  4:15 PM EDT by telephone and verified that I am speaking with the correct person using two identifiers.   I discussed the limitations, risks, security and privacy concerns of performing an evaluation and management service by telephone and the availability of in person appointments. I also discussed with the patient that there may be a patient responsible charge related to this service. The patient expressed understanding and agreed to proceed.  I was present in the office and the patient was in her home.  I  confirmed date of birth and address to authenticate  Identity. My nurse Quentin Ore reviewed medications and ordered any refills required.  Synopsis 73 year old female never smoker with a history of diabetes mellitus type 2, atrial fibrillation on anticoagulation with Xarelto, morbid obesity with BMI 41.28 kg/m, hypertension, hyperlipidemia, Charcot foot status post surgery on 02/11/2018 She has hard to control atrial fibrillation. She had failed flecainide, tikosyn due to prolonged QT and has been on amiodarone since March of 2017. 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 She complains of persistent dyspnea on exertion. She does not have any dyspnea at rest or wheezing. She denies any cough, sputum production, fevers, chills. She reports an reaction to albuterol with palpitations and tachycardia. She is currently on Xopenex and uses it very rarely. 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      History of Present Illness:  Hospital Follow Up:06/22/2018  PCP: Prince Solian, MD  Admit date: 06/03/2018 Discharge date: 06/09/2018  Primary Diagnosis:  Acute hypoxic respiratory failure, sats dropping into  the 70's She was started on Pulmicort nebulizer twice a day, Xopenex nebulizer every  8 hours as needed. CXR on admission 3/13 showed  Cardiomegaly with pulmonary vascular congestion. Mild interstitial opacities are nonspecific but may represent mild interstitial edema. She was diuresed with significant improvement. She was told to follow up with Pulmonary upon discharge.  She was also treated for acute on chronic heart Failure. She was started on IV  Lasix with a net negative of 3.8 L over her admission. She was taking Lasix 80 mg daily at home, and was discharged on this dose. BP was at goal throughout her hospitalization.She takes potassium for repletion of K on lasix.  Hypertension was stable on Toprol XL. Norvasc was discontinued due to lower extremity swelling.  She had an E coli UTI which was resistant to Cipro. She did receive 3 days of Cipro initially. She was treated with Rocephin x 2 days once culture and sensitivities were available.   Stage IV CKD-creatinine improving after diuresis  Diabetes mellitus type 2-blood glucose is controlled, continue [facility] Follow Up Telephone Appointment : Pt. Was seen by her PCP [facility]) today for weight gain of 7 pounds in 24 hours despite taking Lasix 80 mg TID as ordered at discharge from the hospital. She does have a follow up visit with Dr. Dagmar Hait in April, she does not know  the date. She also has a follow up with [facility]  pharmacist. Labs were drawn today at her outside appointment  , but she does not know which ones. She was not given an after visit summary. I do not have access to any of the notes or labs form [facility]  appointment today. Their office closed today at 3 pm, and as such, we were unable to obtain lab results or any other information other than what the patient could provide from memory.   Pt. states her oxygen level dropped when she was having her " outside appointment " at [facility] today. She states she was told  her oxygen level was low while at her outside appointment. She states she was not short of breath. She states her saturation  was 70% when they checked it outside. She states her hands were cold when they checked it. I suspect this was not accurate. She did say her saturation rebounded quickly. Again she did not experience any shortness of breath. She had an ambulatory oxygen saturation test prior to discharge which showed she did not need oxygen. She is not on any oxygen at home. She was told to follow up with pulmonary in her hospital discharge paperwork, but had not yet made an appointment. Therefore [facility]  scheduled  this telephone visit with [place] for today.   Patient is maintaining a diet of under 1500 mg daily. We discussed the need to avoid canned foods as they are very high in sodium.  Pt was previously on Metolazone 2.5 mg as needed for shortness of breath prior to her hospitalization.This had been ordered per her nephrologist.This was not renewed post hospitalization. She was advised today by [facility]  to take her metolazone three times this week.She took one dose already today. She is going to take the additional does Friday and Sunday. I have told her to call me tomorrow morning  if her weight has increased again so that we can schedule her for a CXR, and further follow up. Marland Kitchen Hopefully we will also be able to access her labs at that time, as [facility] should be open.     Observations/Objective: Worsening Heart Failure with 7 pound weight gain over night, and 9 pound weigh gain over the last 2-3 days.. Saturation drop while at an outside appointment with [facility] today, but fingers were cold and patient did not experience dyspnea.. 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 The  cavity size was normal. There is no increase in left ventricular wall thickness. Left ventricular diastolic function could not be evaluated  secondary to atrial fibrillation. There is the interventricular septum is flattened in systole and diastole, consistent with right ventricular pressure and volume overload. Right Ventricle: The right ventricle has severely reduced systolic function. The cavity was moderately enlarged. There is mildly increased right ventricular wall thickness. Left Atrium: left atrial size was normal in size Right Atrium: right atrial size was mildly dilated. Right atrial pressure is estimated at 5 mmHg. Interatrial Septum: No atrial level shunt detected by color flow Doppler. Agitated saline contrast was given intravenously to evaluate for intracardiac shunting. Pericardium: There is no evidence of pericardial effusion. Mitral Valve: The mitral valve is grossly normal. Mild thickening of the mitral valve leaflet. There is mild mitral annular calcification present. Mitral valve regurgitation is moderate by color flow Doppler. Tricuspid Valve: The tricuspid valve is grossly normal. Tricuspid valve regurgitation is moderate by color flow Doppler. The tricuspid valve is mildly thickened. Aortic Valve: The aortic valve is tricuspid Aortic valve regurgitation was not visualized by color flow Doppler.  There is no evidence of aortic valve stenosis. Pulmonic Valve: The pulmonic valve was not well visualized. Pulmonic valve regurgitation is mild by color flow Doppler. Aorta: The aortic root is normal in size and structure.  ? Drop in oxygen saturations to 70% ( Outside visit , hands were cold), recovered quickly Pt. Without dyspnea throughout appointment Plan Take Metolazone 2.5 mg today , and Friday and Sunday this week as you were directed by [facility] This is a diuretic, and will help get rid of the additional fluid you are retaining. Low salt diet, no more than 1500 mg of  salt per day. Avoid canned food if able, as they are especially high in sodium Weigh yourself daily upon awakening after first am void.  Call me in the morning at the number I gave you if your weight continues to climb despite additional diuretic tonight. If your weight is up tomorrow we will have you come in for a CXR and we will assess labs. We will refer you to the Heart Failure Clinic at [facility] to assist with management of your heart Failure. Continue your Pulmicort nebs twice daily as you have been doing Continue your Xopenex nebs as needed for breakthrough shortness of breath. Continue Lasix 80 mg TID as ordered previously We will schedule Pulmonary Function Tests once it is safe to come into the office We will consider Pulmonary Rehab once it is safe to come into the office      Follow Up Instructions: Monday June 27, 2018 at 10 am   I discussed the assessment and treatment plan with the patient. The patient was provided an opportunity to ask questions and all were answered. The patient agreed with the plan and demonstrated an understanding of the instructions.   The patient was advised to call back or seek an in-person evaluation if the symptoms worsen or if the condition fails to improve as anticipated.  I provided 45 minutes of non-face-to-face time during this encounter.   Magdalen Spatz, NP 06/22/2018 5:24 PM

## 2018-06-25 DIAGNOSIS — E1165 Type 2 diabetes mellitus with hyperglycemia: Secondary | ICD-10-CM | POA: Diagnosis not present

## 2018-06-27 ENCOUNTER — Other Ambulatory Visit: Payer: Self-pay

## 2018-06-27 ENCOUNTER — Ambulatory Visit (INDEPENDENT_AMBULATORY_CARE_PROVIDER_SITE_OTHER): Payer: Medicare HMO | Admitting: Acute Care

## 2018-06-27 ENCOUNTER — Encounter: Payer: Self-pay | Admitting: Acute Care

## 2018-06-27 DIAGNOSIS — I509 Heart failure, unspecified: Secondary | ICD-10-CM

## 2018-06-27 NOTE — Progress Notes (Signed)
Virtual Visit via Telephone Note  I connected with Kristine [name] on 06/27/18 at 10:00 AM EDT by telephone and verified that I am speaking with the correct person using two identifiers.   I discussed the limitations, risks, security and privacy concerns of performing an evaluation and management service by telephone and the availability of in person appointments. I also discussed with the patient that there may be a patient responsible charge related to this service. The patient expressed understanding and agreed to proceed.  I  confirmed date of birth and address to authenticate patient identily. My nurse Quentin Ore reviewed medications and ordered any refills required.  Synopsis 73 year old female never smoker with a history of diabetes mellitus type 2, atrial fibrillation on anticoagulation with Xarelto, morbid obesity with BMI 41.28 kg/m, hypertension, hyperlipidemia, Charcot foot status post surgery on 02/11/2018 She has hard to control atrial fibrillation. She had failed flecainide, tikosyn due to prolonged QT and has been on amiodarone since March of 2017. 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 She complains of persistent dyspnea on exertion. She does not have any dyspnea at rest or wheezing. She denies any cough, sputum production, fevers, chills. She reports an reaction to albuterol with palpitations and tachycardia. She is currently on Xopenex and uses it very rarely. 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 She was hospitalized 3/13-3/19 with acute hypoxic respiratory failure that was 2/2 acute on chronic heart failure. ( CXR with cardiomegally , vascular congestion,Mild interstitial opacities are nonspecific but may represent mild interstitial edema). She was treated with diuretics and   told to follow up with Pulmonary upon discharge.She has been seen in the office by both Dr. Vaughan Browner and Dr. Annamaria Boots.     History of Present Illness: Pt.had a virtual visit 06/22/2018 as a hospital follow up/ acute visit after being seen by [facility] 4/1 after she experienced a short, self resolving drop in her oxygen saturation without any shortness of breath or dyspnea during her outside appointment there. She had been seen by [facility] for weight gain despite diuretic compliance.Our plan after 06/22/2018 visit was Take Metolazone 2.5 mg today , and Friday and Sunday this week , in addition to Lasix 80 mg TID. Maintain 1500 mg restriction of salt daily, weigh daily after first void and call for increase in weight , call the office 4/2 for any additional increase in weight with plan for CXR and labs as needed.We sent a referral for the CHF clinic. Asked her to continue her Xopenex / Pulmicort nebs. She will need PFT's, Possibly a CT chest to evaluate for progression of changes due to previous amiodarone exposure, and pulmonary rehab once it is safe for office visits.   Observations/Objective: Pt. States she is doing better. She states her weight is 272 pounds which is down from 280.She took the She states she is breathing better and feeling better. She states she has no edema in her lower extremities other than what is her baseline. She continue to have her Physical Therapy. She has a follow up appointment with renal this month 4/16 th and she will see Dr. Sherin Quarry 07/2018.She has been doing her neb treatments ( budesonide) BID.She is using her Xopenex as needed for shortness of breath.She denies any fever, chest pain, orthopnea or hemoptysis.  Assessment and Plan: Improving Acute on  Chronic Heart Failure with resumption of Metolazone 2.5 mg three times a week. Weight is down 8 pounds since Wednesday 4/1 Plan Continue Lasix 80 mg TID as ordered previously Please continue Metolazone 2.5 mg 3 times  Please call  [facility] today and let them know that you have resumed this medication and ask them if they want to do labs prior to your 4/16/appointment. Remember if you notice that your urine becomes more concentrated, you void less frequently,  Or  you experience dizziness you may be too dry. Call for an appointment for lab check and a reassessment of diuretic treatment with [facility] . Continue your potassium supplementation as previously ordered.  Continue Low salt diet, no more than 1500 mg of salt per day. Avoid canned food if able, as they are especially high in sodium Weigh yourself daily upon awakening after first am void.  Call for weight gain . Follow up with Dr. Acie Fredrickson 07/2018 as you have scheduled Continue your Budesonide  nebs twice daily as you have been doing Continue your Xopenex nebs as needed for breakthrough shortness of breath.  We will schedule Pulmonary Function Tests, and a CT chest as follow up  at your 09/14/2018 11 am appointment We will schedule a HRCT  chest as follow up to nonspecific findings noted in the 01/2016 CT scan  that could represent manifestations of mild amiodarone pulmonary toxicity. We want to make sure these changes are stable now that you are no longer taking this medication. We will consider Pulmonary Rehab once it is safe to come into the office Follow up appointment 09/14/2018 We will schedule a follow up with Dr. Vaughan Browner once his schedule is available for appointments. Please contact office for sooner follow up if symptoms do not improve or worsen or seek emergency care   Continue to self isolate, wash hand frequently, and wear a face mask if you leave your home. Remember to utilize the early hours at grocery stores/ drug stores  for people > 72 years old or any underlying health risks, as the stores are cleaner and less crowded at these times.     Follow Up Instructions: Wednesday 09/14/2018 at 11 am. We will schedule a follow up with Dr. Vaughan Browner  as soon as his schedule is posted.    I discussed the assessment and treatment plan with the patient. The patient was provided an opportunity to ask questions and all were answered. The patient agreed with the plan and demonstrated an understanding of the instructions.   The patient was advised to call back or seek an in-person evaluation if the symptoms worsen or if the condition fails to improve as anticipated.  I provided  25 minutes of non-face-to-face time during this encounter.   Magdalen Spatz, NP 06/27/2018 10:34 AM

## 2018-06-27 NOTE — Patient Instructions (Signed)
Improving Acute on Chronic Heart Failure with resumption of Metolazone 2.5 mg three times a week. Weight is down 8 pounds since Wednesday 4/1 Plan Continue Lasix 80 mg TID as ordered previously Please continue Metolazone 2.5 mg 3 times  Please call [facility] today and let them know that you have resumed this medication and ask them if they want to do labs prior to your 4/16/appointment. Remember if you notice that your urine becomes more concentrated, you void less frequently,  Or  you experience dizziness you may be too dry. Call for an appointment for lab check and a reassessment of diuretic treatment with [facility] . Continue your potassium supplementation as previously ordered.  Continue Low salt diet, no more than 1500 mg of salt per day. Avoid canned food if able, as they are especially high in sodium Weigh yourself daily upon awakening after first am void.  Call for weight gain . Follow up with Dr. Acie Fredrickson 07/2018 as you have scheduled Continue your Budesonide  nebs twice daily as you have been doing Continue your Xopenex nebs as needed for breakthrough shortness of breath.  We will schedule Pulmonary Function Tests, and a CT chest as follow up  at your 09/14/2018 11 am appointment We will schedule a HRCT  chest as follow up to nonspecific findings noted in the 01/2016 CT scan  that could represent manifestations of mild amiodarone pulmonary toxicity. We want to make sure these changes are stable now that you are no longer taking this medication. We will consider Pulmonary Rehab once it is safe to come into the office Follow up appointment 09/14/2018 We will schedule a follow up with Dr. Vaughan Browner once his schedule is available for appointments. Please contact office for sooner follow up if symptoms do not improve or worsen or seek emergency care   Continue to self isolate, wash hand frequently, and wear a face mask if you leave your home. Remember to utilize the early hours at  grocery stores/ drug stores  for people > 8 years old or any underlying health risks, as the stores are cleaner and less crowded at these times.

## 2018-06-28 DIAGNOSIS — S92354A Nondisplaced fracture of fifth metatarsal bone, right foot, initial encounter for closed fracture: Secondary | ICD-10-CM | POA: Diagnosis not present

## 2018-06-30 DIAGNOSIS — E109 Type 1 diabetes mellitus without complications: Secondary | ICD-10-CM | POA: Diagnosis not present

## 2018-06-30 DIAGNOSIS — Z794 Long term (current) use of insulin: Secondary | ICD-10-CM | POA: Diagnosis not present

## 2018-07-03 DIAGNOSIS — E1140 Type 2 diabetes mellitus with diabetic neuropathy, unspecified: Secondary | ICD-10-CM | POA: Diagnosis not present

## 2018-07-03 DIAGNOSIS — M1990 Unspecified osteoarthritis, unspecified site: Secondary | ICD-10-CM | POA: Diagnosis not present

## 2018-07-03 DIAGNOSIS — M6281 Muscle weakness (generalized): Secondary | ICD-10-CM | POA: Diagnosis not present

## 2018-07-03 DIAGNOSIS — R2689 Other abnormalities of gait and mobility: Secondary | ICD-10-CM | POA: Diagnosis not present

## 2018-07-07 DIAGNOSIS — Z794 Long term (current) use of insulin: Secondary | ICD-10-CM | POA: Diagnosis not present

## 2018-07-07 DIAGNOSIS — N184 Chronic kidney disease, stage 4 (severe): Secondary | ICD-10-CM | POA: Diagnosis not present

## 2018-07-07 DIAGNOSIS — E1159 Type 2 diabetes mellitus with other circulatory complications: Secondary | ICD-10-CM | POA: Diagnosis not present

## 2018-07-07 DIAGNOSIS — I130 Hypertensive heart and chronic kidney disease with heart failure and stage 1 through stage 4 chronic kidney disease, or unspecified chronic kidney disease: Secondary | ICD-10-CM | POA: Diagnosis not present

## 2018-07-19 DIAGNOSIS — S92354D Nondisplaced fracture of fifth metatarsal bone, right foot, subsequent encounter for fracture with routine healing: Secondary | ICD-10-CM | POA: Diagnosis not present

## 2018-07-22 DIAGNOSIS — J45909 Unspecified asthma, uncomplicated: Secondary | ICD-10-CM | POA: Insufficient documentation

## 2018-07-30 DIAGNOSIS — Z794 Long term (current) use of insulin: Secondary | ICD-10-CM | POA: Diagnosis not present

## 2018-07-30 DIAGNOSIS — E109 Type 1 diabetes mellitus without complications: Secondary | ICD-10-CM | POA: Diagnosis not present

## 2018-08-02 DIAGNOSIS — M1990 Unspecified osteoarthritis, unspecified site: Secondary | ICD-10-CM | POA: Diagnosis not present

## 2018-08-02 DIAGNOSIS — M6281 Muscle weakness (generalized): Secondary | ICD-10-CM | POA: Diagnosis not present

## 2018-08-02 DIAGNOSIS — R2689 Other abnormalities of gait and mobility: Secondary | ICD-10-CM | POA: Diagnosis not present

## 2018-08-02 DIAGNOSIS — E1140 Type 2 diabetes mellitus with diabetic neuropathy, unspecified: Secondary | ICD-10-CM | POA: Diagnosis not present

## 2018-08-08 DIAGNOSIS — E1165 Type 2 diabetes mellitus with hyperglycemia: Secondary | ICD-10-CM | POA: Diagnosis not present

## 2018-08-12 ENCOUNTER — Telehealth: Payer: Self-pay | Admitting: *Deleted

## 2018-08-12 NOTE — Telephone Encounter (Signed)
Called and spoke with patient to confirm her virtual visit scheduled for 08/16/18 at 8AM with Dr Acie Fredrickson and to obtain consent. She gave verbal consent and she will have vitals ready prior to her visit time.    ..   Virtual Visit Pre-Appointment Phone Call  "(Name), I am calling you today to discuss your upcoming appointment. We are currently trying to limit exposure to the virus that causes COVID-19 by seeing patients at home rather than in the office."  1. "What is the BEST phone number to call the day of the visit?" - include this in appointment notes  2. "Do you have or have access to (through a family member/friend) a smartphone with video capability that we can use for your visit?" a. If yes - list this number in appt notes as "cell" (if different from BEST phone #) and list the appointment type as a VIDEO visit in appointment notes b. If no - list the appointment type as a PHONE visit in appointment notes  3. Confirm consent - "In the setting of the current Covid19 crisis, you are scheduled for a (phone or video) visit with your provider on (date) at (time).  Just as we do with many in-office visits, in order for you to participate in this visit, we must obtain consent.  If you'd like, I can send this to your mychart (if signed up) or email for you to review.  Otherwise, I can obtain your verbal consent now.  All virtual visits are billed to your insurance company just like a normal visit would be.  By agreeing to a virtual visit, we'd like you to understand that the technology does not allow for your provider to perform an examination, and thus may limit your provider's ability to fully assess your condition. If your provider identifies any concerns that need to be evaluated in person, we will make arrangements to do so.  Finally, though the technology is pretty good, we cannot assure that it will always work on either your or our end, and in the setting of a video visit, we may have to  convert it to a phone-only visit.  In either situation, we cannot ensure that we have a secure connection.  Are you willing to proceed?" STAFF: Did the patient verbally acknowledge consent to telehealth visit? Document YES/NO here: YES  4. Advise patient to be prepared - "Two hours prior to your appointment, go ahead and check your blood pressure, pulse, oxygen saturation, and your weight (if you have the equipment to check those) and write them all down. When your visit starts, your provider will ask you for this information. If you have an Apple Watch or Kardia device, please plan to have heart rate information ready on the day of your appointment. Please have a pen and paper handy nearby the day of the visit as well."  5. Give patient instructions for MyChart download to smartphone OR Doximity/Doxy.me as below if video visit (depending on what platform provider is using)  6. Inform patient they will receive a phone call 15 minutes prior to their appointment time (may be from unknown caller ID) so they should be prepared to answer    TELEPHONE CALL NOTE  MAHNOOR MATHISEN has been deemed a candidate for a follow-up tele-health visit to limit community exposure during the Covid-19 pandemic. I spoke with the patient via phone to ensure availability of phone/video source, confirm preferred email & phone number, and discuss instructions and expectations.  I  reminded [name] to be prepared with any vital sign and/or heart rhythm information that could potentially be obtained via home monitoring, at the time of her visit. I reminded KAIREE KOZMA to expect a phone call prior to her visit.  Juventino Slovak, CMA 08/12/2018 11:45 AM   INSTRUCTIONS FOR DOWNLOADING THE MYCHART APP TO SMARTPHONE  - The patient must first make sure to have activated MyChart and know their login information - If Apple, go to [employer] and type in MyChart in the search bar and download the app. If Android, ask  patient to go to [employer] and type in [place] in the search bar and download the app. The app is free but as with any other app downloads, their phone may require them to verify saved payment information or Apple/Android password.  - The patient will need to then log into the app with their MyChart username and password, and select [facility] as their healthcare provider to link the account. When it is time for your visit, go to the MyChart app, find appointments, and click Begin Video Visit. Be sure to Select Allow for your device to access the Microphone and Camera for your visit. You will then be connected, and your provider will be with you shortly.  **If they have any issues connecting, or need assistance please contact MyChart service desk [phone])**  **If using a computer, in order to ensure the best quality for their visit they will need to use either of the following Internet Browsers: [employer], or Google Chrome**  IF USING DOXIMITY or DOXY.ME - The patient will receive a link just prior to their visit by text.     FULL LENGTH CONSENT FOR TELE-HEALTH VISIT   I hereby voluntarily request, consent and authorize [facility] and its employed or contracted physicians, physician assistants, nurse practitioners or other licensed health care professionals (the Practitioner), to provide me with telemedicine health care services (the "Services") as deemed necessary by the treating Practitioner. I acknowledge and consent to receive the Services by the Practitioner via telemedicine. I understand that the telemedicine visit will involve communicating with the Practitioner through live audiovisual communication technology and the disclosure of certain medical information by electronic transmission. I acknowledge that I have been given the opportunity to request an in-person assessment or other available alternative prior to the telemedicine visit and am voluntarily  participating in the telemedicine visit.  I understand that I have the right to withhold or withdraw my consent to the use of telemedicine in the course of my care at any time, without affecting my right to future care or treatment, and that the Practitioner or I may terminate the telemedicine visit at any time. I understand that I have the right to inspect all information obtained and/or recorded in the course of the telemedicine visit and may receive copies of available information for a reasonable fee.  I understand that some of the potential risks of receiving the Services via telemedicine include:  Marland Kitchen Delay or interruption in medical evaluation due to technological equipment failure or disruption; . Information transmitted may not be sufficient (e.g. poor resolution of images) to allow for appropriate medical decision making by the Practitioner; and/or  . In rare instances, security protocols could fail, causing a breach of personal health information.  Furthermore, I acknowledge that it is my responsibility to provide information about my medical history, conditions and care that is complete and accurate to the best of  my ability. I acknowledge that Practitioner's advice, recommendations, and/or decision may be based on factors not within their control, such as incomplete or inaccurate data provided by me or distortions of diagnostic images or specimens that may result from electronic transmissions. I understand that the practice of medicine is not an exact science and that Practitioner makes no warranties or guarantees regarding treatment outcomes. I acknowledge that I will receive a copy of this consent concurrently upon execution via email to the email address I last provided but may also request a printed copy by calling the office of [facility].    I understand that my insurance will be billed for this visit.   I have read or had this consent read to me. . I understand the contents of this  consent, which adequately explains the benefits and risks of the Services being provided via telemedicine.  . I have been provided ample opportunity to ask questions regarding this consent and the Services and have had my questions answered to my satisfaction. . I give my informed consent for the services to be provided through the use of telemedicine in my medical care  By participating in this telemedicine visit I agree to the above.

## 2018-08-15 NOTE — Progress Notes (Signed)
Virtual Visit via Telephone Note   Kristine visit type was conducted due to national recommendations for restrictions regarding Kristine COVID-19 Pandemic (e.g. social distancing) in an effort to limit Kristine [name]'s exposure and mitigate transmission in our community.  Due to Kristine [name] co-morbid illnesses, Kristine [name] is at least at moderate risk for complications without adequate follow up.  Kristine format is felt to be most appropriate for Kristine [name] at Kristine time.  Kristine [name] did not have access to video technology/had technical difficulties with video requiring transitioning to audio format only (telephone).  All issues noted in Kristine document were discussed and addressed.  No physical exam could be performed with Kristine format.  Please refer to Kristine [name]'s chart for Kristine [name]  consent to telehealth for [facility].   Date:  08/16/2018   ID:  Kristine [name], DOB 04/04/1945, MRN [id number]  [name] Location: Home Provider Location: Office  PCP:  Prince Solian, MD  Cardiologist:  Mertie Moores, MD  Electrophysiologist:  None   Evaluation Performed:  Follow-Up Visit  Chief Complaint:  Atrial fib, chronic diastolic chf  Previous notes  Aug 06, 2017:  Had some weakness while coming in Kristine afternoon.    Had fasted all day - until 2 PM  Feeling better.  Breathing is good   She is had lots of leg edema.  She has been seeing Dr. [name].  She now takes Lasix 80 mg 3 times a day.  She takes metolazone 2.5 mg 3 days a week.   Aug 16, 2018   Kristine [name] is a 73 y.o. female with chronic diastolic congestive heart failure. Breathing is ok.    Weather seems to affect Kristine [name] breathing ok  Was in Kristine hospital in March. Still eating salty foods 3 times a week,  Bacon, sausage, hot dogs   Reading labels,   Last echo showed normal LV systolic function. indeterminant diastolic function ( a-fib)  Has mod pulmonary HTN with RV dilitation. Has DOE No N,V,D Down 7 lbs from hospitalization.  Is on a  relatively high dose of potassium .        Kristine [name] does not have symptoms concerning for COVID-19 infection (fever, chills, cough, or new shortness of breath).    Past Medical History:  Diagnosis Date  . Anxiety   . Asthma   . Atrial fibrillation ([place])   . CHF (congestive heart failure) ([place])   . Chronic bronchitis ([place])   . GERD (gastroesophageal reflux disease)   . High cholesterol   . History of hiatal hernia   . HTN (hypertension)   . 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 Morbid obesity ([place])   . Osteoporosis   . Persistent atrial fibrillation   . Scoliosis   . Spinal stenosis   . Type II diabetes mellitus ([place])   . Vitamin D deficiency    Past Surgical History:  Procedure Laterality Date  . BLADDER SUSPENSION  1980s  . CARDIAC CATHETERIZATION  11/16/09   SMOOTH AND NORMAL  . CARDIOVERSION N/A 01/07/2015   Procedure: CARDIOVERSION;  Surgeon: Skeet Latch, MD;  Location: [facility] ENDOSCOPY;  Service: Cardiovascular;  Laterality: N/A;  . CARDIOVERSION N/A 03/11/2015   Procedure: CARDIOVERSION;  Surgeon: Skeet Latch, MD;  Location: [facility];  Service: Cardiovascular;  Laterality: N/A;  . CARPAL TUNNEL RELEASE Left 1970s  . CATARACT EXTRACTION W/ INTRAOCULAR LENS  IMPLANT, BILATERAL Bilateral ~ 2010  . COLONOSCOPY  08/14/08  . FINGER FRACTURE SURGERY Right 1970s   "ring  finger"  . FRACTURE SURGERY    . LAPAROSCOPIC CHOLECYSTECTOMY  1980s  . TUBAL LIGATION    . VAGINAL HYSTERECTOMY  1980     Current Meds  Medication Sig  . ALPRAZolam (XANAX) 0.25 MG tablet Take 0.125-0.25 mg by mouth 2 (two) times daily as needed for anxiety.   . BD PEN NEEDLE NANO U/F 32G X 4 MM MISC Inject 1 each as directed See admin instructions. Use pen needles with insulin pens daily  . budesonide (PULMICORT) 0.25 MG/2ML nebulizer solution Take 2 mLs (0.25 mg total) by nebulization 2 (two) times daily.  Marland Kitchen estradiol (ESTRACE) 0.1 MG/GM vaginal cream Place 1 Applicatorful vaginally as  needed (Estrace).   . furosemide (LASIX) 80 MG tablet Take 80 mg by mouth 3 (three) times daily.  . insulin aspart (NOVOLOG FLEXPEN) 100 UNIT/ML FlexPen Inject 30 Units into Kristine skin 3 (three) times daily with meals. Plus sliding scale  . Insulin Degludec (TRESIBA FLEXTOUCH) 200 UNIT/ML SOPN Inject 100 Units into Kristine skin every morning.   Marland Kitchen KLOR-CON M20 20 MEQ tablet Take 60 mg by mouth 2 (two) times daily.  Marland Kitchen levalbuterol (XOPENEX) 0.63 MG/3ML nebulizer solution Take 3 mLs (0.63 mg total) by nebulization every 8 (eight) hours as needed for up to 30 days for wheezing or shortness of breath.  . levothyroxine (SYNTHROID, LEVOTHROID) 50 MCG tablet Take 50 mcg by mouth daily.   Marland Kitchen LYRICA 50 MG capsule Take 50-150 mg by mouth See admin instructions. Take 50 mg in Kristine morning 50 gm at lunch ups to 150 at bedtime  . metolazone (ZAROXOLYN) 2.5 MG tablet Take 1 tablet by mouth 3 (three) times a week. Mon, Wed, Fri  . metoprolol succinate (TOPROL-XL) 50 MG 24 hr tablet Take 50 mg by mouth daily. Take with or immediately following a meal.  . ULORIC 80 MG TABS Take 1 tablet by mouth daily.  . Vitamin D, Ergocalciferol, (DRISDOL) 50000 UNITS CAPS Take 50,000 Units by mouth every 7 (seven) days. Takes on Thursday  . XARELTO 15 MG TABS tablet Take 15 mg by mouth daily with supper.      Allergies:   Albuterol; Epinephrine; Penicillins; Torsemide; Bee venom; and Ivp dye [iodinated diagnostic agents]   Social History   Tobacco Use  . Smoking status: Never Smoker  . Smokeless tobacco: Never Used  Substance Use Topics  . Alcohol use: No  . Drug use: No     Family Hx: Kristine [name]'s family history includes Atrial fibrillation in Kristine [name] father; CAD in Kristine [name] brother and sister; Congestive Heart Failure in Kristine [name] mother; Heart failure in Kristine [name] mother; Hypertension in Kristine [name] brother, father, mother, sister, and son; Stroke in Kristine [name] father.  ROS:   Please see Kristine history of present illness.     All other systems reviewed and  are negative.   Prior CV studies:   Kristine following studies were reviewed today:    Labs/Other Tests and Data Reviewed:    EKG:  No ECG reviewed.  Recent Labs: 06/03/2018: B Natriuretic Peptide 198.3 06/04/2018: Hemoglobin 11.1; Magnesium 2.3; Platelets 170 06/09/2018: BUN 37; Creatinine, Ser 2.05; Potassium 4.0; Sodium 139   Recent Lipid Panel Lab Results  Component Value Date/Time   CHOL 123 06/04/2018 05:35 AM   CHOL 180 08/06/2017 01:54 PM   TRIG 105 06/04/2018 05:35 AM   HDL 25 (L) 06/04/2018 05:35 AM   HDL 34 (L) 08/06/2017 01:54 PM   CHOLHDL 4.9 06/04/2018 05:35 AM   LDLCALC 77 06/04/2018 05:35 AM  [facility] 91 08/06/2017 01:54 PM    Wt Readings from Last 3 Encounters:  08/16/18 270 lb (122.5 kg)  06/09/18 276 lb (125.2 kg)  02/01/18 267 lb (121.1 kg)     Objective:    Vital Signs:  BP 120/73 (BP Location: Right Arm, [name] Position: Sitting, Cuff Size: Normal)   Pulse 83   Ht 5\' 9"  (1.753 m)   Wt 270 lb (122.5 kg)   BMI 39.87 kg/m      ASSESSMENT & PLAN:    1. Acute diastolic congestive heart failure: Sergio was hospitalized in March.  She is now on a high-dose Lasix, potassium, metolazone therapy.  She continues to eat a fairly high salt diet.  She is really made no effort to Kristine [name] processed meat intake.  She does watch Kristine [name] fluids.  I have encouraged Kristine [name] to completely cut out Kristine [name] processed meats such as bologna, ham, bacon.  I suggested that she eat an egg white omelette with grilled chicken in Kristine morning instead of bacon and sausage.  She will have labs drawn Kristine week at Kristine nephrology office.  2.  Moderate pulmonary hypertension.  Kristine [name] last echocardiogram revealed right ventricular dilatation with decreased RV function.  Kristine [name] estimated PA pressure is in Kristine 40s.  I encouraged Kristine [name] to.  I strongly encouraged Kristine [name] to lose weight.  3.  Chronic kidney disease: [name]'s creatinine is 2.  She will follow-up with nephrology.  Continues to medications for now.    4. Morbid obesity:   Body mass index is 39.87 kg/m.     COVID-19 Education: Kristine signs and symptoms of COVID-19 were discussed with Kristine [name] and how to seek care for testing (follow up with PCP or arrange E-visit).  Kristine importance of social distancing was discussed today.  Time:   Today, I have spent 20  minutes with Kristine [name] with telehealth technology discussing Kristine above problems.     Medication Adjustments/Labs and Tests Ordered: Current medicines are reviewed at length with Kristine [name] today.  Concerns regarding medicines are outlined above.   Tests Ordered: No orders of Kristine defined types were placed in Kristine encounter.   Medication Changes: No orders of Kristine defined types were placed in Kristine encounter.   Disposition:  Follow up in 6 month(s)  Signed, Mertie Moores, MD  08/16/2018 8:27 AM    [facility]

## 2018-08-16 ENCOUNTER — Encounter: Payer: Self-pay | Admitting: Cardiovascular Disease

## 2018-08-16 ENCOUNTER — Ambulatory Visit: Payer: Medicare HMO | Admitting: Cardiovascular Disease

## 2018-08-16 ENCOUNTER — Telehealth (INDEPENDENT_AMBULATORY_CARE_PROVIDER_SITE_OTHER): Payer: Medicare HMO | Admitting: Cardiovascular Disease

## 2018-08-16 ENCOUNTER — Other Ambulatory Visit: Payer: Self-pay

## 2018-08-16 VITALS — BP 120/73 | HR 83 | Ht 69.0 in | Wt 270.0 lb

## 2018-08-16 DIAGNOSIS — I5032 Chronic diastolic (congestive) heart failure: Secondary | ICD-10-CM | POA: Diagnosis not present

## 2018-08-16 DIAGNOSIS — I4819 Other persistent atrial fibrillation: Secondary | ICD-10-CM | POA: Diagnosis not present

## 2018-08-16 NOTE — Patient Instructions (Signed)
Medication Instructions:  Your physician recommends that you continue on your current medications as directed. Please refer to the Current Medication list given to you today.  If you need a refill on your cardiac medications before your next appointment, please call your pharmacy.    Lab work: None Ordered Please have [employer] your lab results to [phone]   Testing/Procedures: None Ordered   Follow-Up: At [facility], you and your health needs are our priority.  As part of our continuing mission to provide you with exceptional heart care, we have created designated Provider Care Teams.  These Care Teams include your primary Cardiologist (physician) and Advanced Practice Providers (APPs -  Physician Assistants and Nurse Practitioners) who all work together to provide you with the care you need, when you need it. You will need a follow up appointment in:  6 months.  Please call our office 2 months in advance to schedule this appointment.  You may see Mertie Moores, MD or one of the following Advanced Practice Providers on your designated Care Team: Richardson Dopp, PA-C [place], [place] . Daune Perch, NP

## 2018-08-22 DIAGNOSIS — Z03818 Encounter for observation for suspected exposure to other biological agents ruled out: Secondary | ICD-10-CM | POA: Diagnosis not present

## 2018-08-22 DIAGNOSIS — N184 Chronic kidney disease, stage 4 (severe): Secondary | ICD-10-CM | POA: Diagnosis not present

## 2018-08-23 ENCOUNTER — Telehealth: Payer: Self-pay

## 2018-08-23 ENCOUNTER — Other Ambulatory Visit: Payer: Self-pay

## 2018-08-23 DIAGNOSIS — K921 Melena: Secondary | ICD-10-CM

## 2018-08-23 DIAGNOSIS — R194 Change in bowel habit: Secondary | ICD-10-CM

## 2018-08-23 DIAGNOSIS — Z7901 Long term (current) use of anticoagulants: Secondary | ICD-10-CM

## 2018-08-23 DIAGNOSIS — Z1211 Encounter for screening for malignant neoplasm of colon: Secondary | ICD-10-CM

## 2018-08-23 MED ORDER — SUPREP BOWEL PREP KIT 17.5-3.13-1.6 GM/177ML PO SOLN: ORAL | 0 refills | Status: DC

## 2018-08-23 NOTE — Telephone Encounter (Signed)
It appears GI team is already aware of the clearance and recommendation for holding Xarelto as outlined. Will remove from preop box. Please call with questions.

## 2018-08-23 NOTE — Telephone Encounter (Signed)
Ms. Kalka is OK to hold Xarelto for 2 days prior to colonoscopy  She is at low risk

## 2018-08-23 NOTE — Progress Notes (Signed)
Pt scheduled for colon on 6-17 and Previsit on 6-15. Suprep sent and Ambulatory referral done.  Request sent to [facility] (Dr. Cathie Olden) for pt to hold Xarelto for 4 days.

## 2018-08-23 NOTE — Telephone Encounter (Signed)
[facility] Pre-operative Risk Assessment     Request for surgical clearance:     Endoscopy Procedure  What type of surgery is being performed?     COLONOSCOPY  When is this surgery scheduled?     09-07-18  What type of clearance is required ?   Pharmacy  Are there any medications that need to be held prior to surgery and how long? Xarelto 4 days (latest GFR = 24) starting 09-03-18  Practice name and name of physician performing surgery?   Dr. Thornton Park,   [facility]  What is your office phone and fax number?      Phone- [phone]  Fax- [phone] Contact Kristine [name], CMA  Anesthesia type (None, local, MAC, general) ?       MAC  Thank you!

## 2018-08-23 NOTE — Telephone Encounter (Signed)
Patient with diagnosis of Afib on Xarelto for anticoagulation.    Procedure: colonoscopy Date of procedure: 09/07/18  CHADS2-VASc score of  6 (CHF, HTN, AGE, DM2, stroke/tia x 2, CAD, AGE, female)  CrCl 28m/min  Per office protocol, patient can hold Xarelto for 2 days prior to procedure. Xarelto is dosed based on crcl not eGFR and she is appropriately on low dose Xarelto.

## 2018-08-23 NOTE — Telephone Encounter (Signed)
Called and LM for pt to hold Xarelto starting on 6-15. Asked her to call back to confirm receipt of message.

## 2018-08-23 NOTE — Telephone Encounter (Signed)
   Primary Cardiologist: Mertie Moores, MD  Chart reviewed as part of pre-operative protocol coverage. Patient was contacted 08/23/2018 in reference to pre-operative risk assessment for pending surgery as outlined below.  Kristine [name] was last seen on 08/16/18 by dr. Acie Fredrickson in telemedicine. She has persistent atrial fib (now on rate control strategy only), pulm HTN, chronic diastolic CHF, chronic bronchitis, GERD, HLD, HTN, morbid obesity, DM, CKD stage IV. Nuc in 2017 was normal.   Will forward to pharm for input on Eliquis request (? 4 days).  I tried to reach patient for screening call but only got VM each time. Since patient was just seen in telehealth follow-up a few days ago by Dr. Acie Fredrickson, will get his input on if OK to proceed with colonoscopy. Revised cardiac risk index >11% but since this is colonoscopy it remains fairly low risk procedure. Dr. Acie Fredrickson - Please route response to P CV DIV PREOP (the pre-op pool). Thank you.  Charlie Pitter, PA-C 08/23/2018, 10:18 AM

## 2018-08-29 ENCOUNTER — Encounter: Payer: Self-pay | Admitting: Gastroenterology

## 2018-08-29 ENCOUNTER — Ambulatory Visit
Admission: RE | Admit: 2018-08-29 | Discharge: 2018-08-29 | Disposition: A | Discharge status: Home / Self Care | Payer: Medicare HMO | Source: Ambulatory Visit | Attending: Internal Medicine | Admitting: Internal Medicine

## 2018-08-29 ENCOUNTER — Other Ambulatory Visit: Payer: Self-pay

## 2018-08-29 ENCOUNTER — Other Ambulatory Visit: Payer: Self-pay | Admitting: Neurology

## 2018-08-29 DIAGNOSIS — I129 Hypertensive chronic kidney disease with stage 1 through stage 4 chronic kidney disease, or unspecified chronic kidney disease: Secondary | ICD-10-CM | POA: Diagnosis not present

## 2018-08-29 DIAGNOSIS — N184 Chronic kidney disease, stage 4 (severe): Secondary | ICD-10-CM | POA: Diagnosis not present

## 2018-08-29 DIAGNOSIS — E876 Hypokalemia: Secondary | ICD-10-CM | POA: Diagnosis not present

## 2018-08-29 DIAGNOSIS — E109 Type 1 diabetes mellitus without complications: Secondary | ICD-10-CM | POA: Diagnosis not present

## 2018-08-29 DIAGNOSIS — Z1231 Encounter for screening mammogram for malignant neoplasm of breast: Secondary | ICD-10-CM | POA: Diagnosis not present

## 2018-08-29 DIAGNOSIS — E8770 Fluid overload, unspecified: Secondary | ICD-10-CM | POA: Diagnosis not present

## 2018-08-29 DIAGNOSIS — Z794 Long term (current) use of insulin: Secondary | ICD-10-CM | POA: Diagnosis not present

## 2018-09-02 DIAGNOSIS — R2689 Other abnormalities of gait and mobility: Secondary | ICD-10-CM | POA: Diagnosis not present

## 2018-09-02 DIAGNOSIS — I4891 Unspecified atrial fibrillation: Secondary | ICD-10-CM | POA: Diagnosis not present

## 2018-09-02 DIAGNOSIS — M1990 Unspecified osteoarthritis, unspecified site: Secondary | ICD-10-CM | POA: Diagnosis not present

## 2018-09-02 DIAGNOSIS — R69 Illness, unspecified: Secondary | ICD-10-CM | POA: Diagnosis not present

## 2018-09-02 DIAGNOSIS — E1140 Type 2 diabetes mellitus with diabetic neuropathy, unspecified: Secondary | ICD-10-CM | POA: Diagnosis not present

## 2018-09-02 DIAGNOSIS — Z794 Long term (current) use of insulin: Secondary | ICD-10-CM | POA: Diagnosis not present

## 2018-09-02 DIAGNOSIS — G8929 Other chronic pain: Secondary | ICD-10-CM | POA: Diagnosis not present

## 2018-09-02 DIAGNOSIS — M6281 Muscle weakness (generalized): Secondary | ICD-10-CM | POA: Diagnosis not present

## 2018-09-02 DIAGNOSIS — I509 Heart failure, unspecified: Secondary | ICD-10-CM | POA: Diagnosis not present

## 2018-09-02 DIAGNOSIS — E039 Hypothyroidism, unspecified: Secondary | ICD-10-CM | POA: Diagnosis not present

## 2018-09-02 DIAGNOSIS — I110 Hypertensive heart disease with heart failure: Secondary | ICD-10-CM | POA: Diagnosis not present

## 2018-09-02 DIAGNOSIS — E1142 Type 2 diabetes mellitus with diabetic polyneuropathy: Secondary | ICD-10-CM | POA: Diagnosis not present

## 2018-09-05 ENCOUNTER — Other Ambulatory Visit: Payer: Self-pay

## 2018-09-05 ENCOUNTER — Ambulatory Visit (AMBULATORY_SURGERY_CENTER): Payer: Self-pay

## 2018-09-05 VITALS — Ht 69.0 in | Wt 283.0 lb

## 2018-09-05 DIAGNOSIS — K921 Melena: Secondary | ICD-10-CM

## 2018-09-05 DIAGNOSIS — R194 Change in bowel habit: Secondary | ICD-10-CM

## 2018-09-05 MED ORDER — NA SULFATE-K SULFATE-MG SULF 17.5-3.13-1.6 GM/177ML PO SOLN: 1.0000 | Freq: Once | ORAL | 0 refills | Status: AC

## 2018-09-05 NOTE — Progress Notes (Signed)
Denies allergies to eggs or soy products. Denies complication of anesthesia or sedation. Denies use of weight loss medication. Denies use of O2.   Emmi instructions given for colonoscopy.  Pre-Visit was conducted by phone due to Covid 19. Instructions were reviewed and mailed to patients confirmed home address. Patient was encouraged to call if she had any questions or concerns regarding instructions.

## 2018-09-07 ENCOUNTER — Encounter: Payer: Medicare HMO | Admitting: Gastroenterology

## 2018-09-07 DIAGNOSIS — E11610 Type 2 diabetes mellitus with diabetic neuropathic arthropathy: Secondary | ICD-10-CM | POA: Diagnosis not present

## 2018-09-07 DIAGNOSIS — M216X9 Other acquired deformities of unspecified foot: Secondary | ICD-10-CM | POA: Diagnosis not present

## 2018-09-08 ENCOUNTER — Telehealth: Payer: Self-pay | Admitting: Gastroenterology

## 2018-09-08 DIAGNOSIS — E1165 Type 2 diabetes mellitus with hyperglycemia: Secondary | ICD-10-CM | POA: Diagnosis not present

## 2018-09-08 NOTE — Telephone Encounter (Signed)

## 2018-09-09 ENCOUNTER — Encounter: Payer: Self-pay | Admitting: Gastroenterology

## 2018-09-09 ENCOUNTER — Ambulatory Visit (AMBULATORY_SURGERY_CENTER): Payer: Medicare HMO | Admitting: Gastroenterology

## 2018-09-09 ENCOUNTER — Other Ambulatory Visit: Payer: Self-pay

## 2018-09-09 VITALS — BP 149/86 | HR 80 | Temp 98.6°F | Resp 19 | Ht 69.0 in | Wt 267.0 lb

## 2018-09-09 DIAGNOSIS — K5730 Diverticulosis of large intestine without perforation or abscess without bleeding: Secondary | ICD-10-CM | POA: Diagnosis not present

## 2018-09-09 DIAGNOSIS — K5520 Angiodysplasia of colon without hemorrhage: Secondary | ICD-10-CM

## 2018-09-09 DIAGNOSIS — D120 Benign neoplasm of cecum: Secondary | ICD-10-CM

## 2018-09-09 DIAGNOSIS — K648 Other hemorrhoids: Secondary | ICD-10-CM

## 2018-09-09 DIAGNOSIS — R194 Change in bowel habit: Secondary | ICD-10-CM

## 2018-09-09 DIAGNOSIS — D125 Benign neoplasm of sigmoid colon: Secondary | ICD-10-CM | POA: Diagnosis not present

## 2018-09-09 DIAGNOSIS — D127 Benign neoplasm of rectosigmoid junction: Secondary | ICD-10-CM | POA: Diagnosis not present

## 2018-09-09 DIAGNOSIS — D128 Benign neoplasm of rectum: Secondary | ICD-10-CM | POA: Diagnosis not present

## 2018-09-09 MED ORDER — SODIUM CHLORIDE 0.9 % IV SOLN: 500.0000 mL | Freq: Once | INTRAVENOUS | Status: DC

## 2018-09-09 NOTE — Patient Instructions (Signed)
YOU HAD AN ENDOSCOPIC PROCEDURE TODAY AT [facility]:   Refer to the procedure report that was given to you for any specific questions about what was found during the examination.  If the procedure report does not answer your questions, please call your gastroenterologist to clarify.  If you requested that your care partner not be given the details of your procedure findings, then the procedure report has been included in a sealed envelope for you to review at your convenience later.  YOU SHOULD EXPECT: Some feelings of bloating in the abdomen. Passage of more gas than usual.  Walking can help get rid of the air that was put into your GI tract during the procedure and reduce the bloating. If you had a lower endoscopy (such as a colonoscopy or flexible sigmoidoscopy) you may notice spotting of blood in your stool or on the toilet paper. If you underwent a bowel prep for your procedure, you may not have a normal bowel movement for a few days.  Please Note:  You might notice some irritation and congestion in your nose or some drainage.  This is from the oxygen used during your procedure.  There is no need for concern and it should clear up in a day or so.  SYMPTOMS TO REPORT IMMEDIATELY:   Following lower endoscopy (colonoscopy or flexible sigmoidoscopy):  Excessive amounts of blood in the stool  Significant tenderness or worsening of abdominal pains  Swelling of the abdomen that is new, acute  Fever of 100F or higher   For urgent or emergent issues, a gastroenterologist can be reached at any hour by calling [phone].   DIET:  We do recommend a small meal at first, but then you may proceed to your regular diet.  Drink plenty of fluids but you should avoid alcoholic beverages for 24 hours.  ACTIVITY:  You should plan to take it easy for the rest of today and you should NOT DRIVE or use heavy machinery until tomorrow (because of the sedation medicines used during the test).     FOLLOW UP: Our staff will call the number listed on your records 48-72 hours following your procedure to check on you and address any questions or concerns that you may have regarding the information given to you following your procedure. If we do not reach you, we will leave a message.  We will attempt to reach you two times.  During this call, we will ask if you have developed any symptoms of COVID 19. If you develop any symptoms (ie: fever, flu-like symptoms, shortness of breath, cough etc.) before then, please call [phone].  If you test positive for Covid 19 in the 2 weeks post procedure, please call and report this information to [place].    If any biopsies were taken you will be contacted by phone or by letter within the next 1-3 weeks.  Please call us at [phone] if you have not heard about the biopsies in 3 weeks.    SIGNATURES/CONFIDENTIALITY: You and/or your care partner have signed paperwork which will be entered into your electronic medical record.  These signatures attest to the fact that that the information above on your After Visit Summary has been reviewed and is understood.  Full responsibility of the confidentiality of this discharge information lies with you and/or your care-partner.   Thank you for allowing [place] to provide your healthcare today.

## 2018-09-09 NOTE — Progress Notes (Signed)
Called to room to assist during endoscopic procedure.  Patient ID and intended procedure confirmed with present staff. Received instructions for my participation in the procedure from the performing physician.  

## 2018-09-09 NOTE — Progress Notes (Signed)
PT taken to PACU. Monitors in place. VSS. Report given to RN. 

## 2018-09-09 NOTE — Op Note (Signed)
[place] Patient Name: Kristine [name] Procedure Date: 09/09/2018 4:18 PM MRN: [id number] Endoscopist: Thornton Park MD, MD Age: 73 Referring MD:  Date of Birth: [birth year] Gender: Female Account #: [id number] Procedure:                Colonoscopy Indications:              Surveillance: Personal history of colonic polyps                            (unknown histology) on last colonoscopy more than 5                            years ago                           Bright red blood with bowel movements                           One episode of black stools                           Change in bowel habits                           Chronic constipation exacerbated by immobility                           Xarelto use for atrial fibrillation                           Diverticulosis on colonoscopy                           Distant history of colon polyps - results not                            available to me or in Epic                           - no polyps on colonoscopy in 2014 Medicines:                See the Anesthesia note for documentation of the                            administered medications Procedure:                Pre-Anesthesia Assessment:                           - Prior to the procedure, a History and Physical                            was performed, and patient medications and                            allergies were reviewed. The patient's tolerance of  previous anesthesia was also reviewed. The risks                            and benefits of the procedure and the sedation                            options and risks were discussed with the patient.                            All questions were answered, and informed consent                            was obtained. Prior Anticoagulants: The patient has                            taken Xarelto (rivaroxaban), last dose was 3 days                            prior to procedure. ASA  Grade Assessment: III - A                            patient with severe systemic disease. After                            reviewing the risks and benefits, the patient was                            deemed in satisfactory condition to undergo the                            procedure.                           After obtaining informed consent, the colonoscope                            was passed under direct vision. Throughout the                            procedure, the patient's blood pressure, pulse, and                            oxygen saturations were monitored continuously. The                            Colonoscope was introduced through the anus and                            advanced to the the terminal ileum, with                            identification of the appendiceal orifice and IC  valve. The colonoscopy was performed without                            difficulty. The patient tolerated the procedure                            well. The quality of the bowel preparation was                            good. The ileocecal valve, appendiceal orifice, and                            rectum were photographed. Scope In: 4:31:26 PM Scope Out: 4:54:19 PM Scope Withdrawal Time: 0 hours 18 minutes 24 seconds  Total Procedure Duration: 0 hours 22 minutes 53 seconds  Findings:                 The perianal and digital rectal examinations were                            normal.                           Multiple small and large-mouthed diverticula were                            found in the left colon.                           Two medium-sized angioectasias without bleeding                            were found in the transverse colon and in the                            ascending colon.                           A 2 mm polyp was found in the rectum. The polyp was                            sessile. The polyp was removed with a cold biopsy                             forceps. Resection and retrieval were complete.                            Estimated blood loss was minimal.                           Three sessile polyps were found in the rectum,                            sigmoid colon and cecum. The polyps were 2 to 3 mm  in size. These polyps were removed with a cold                            snare. Resection and retrieval were complete.                           Non-bleeding internal hemorrhoids were found. The                            hemorrhoids were medium-sized.                           The exam was otherwise without abnormality on                            direct and retroflexion views. Complications:            No immediate complications. Estimated blood loss:                            Minimal. Estimated Blood Loss:     Estimated blood loss was minimal. Impression:               - Diverticulosis in the left colon.                           - Two non-bleeding colonic angioectasias.                           - One 2 mm polyp in the rectum, removed with a cold                            biopsy forceps. Resected and retrieved.                           - Three 2 to 3 mm polyps in the rectum, in the                            sigmoid colon and in the cecum, removed with a cold                            snare. Resected and retrieved.                           - Non-bleeding internal hemorrhoids.                           - The examination was otherwise normal on direct                            and retroflexion views. Recommendation:           - Discharge patient to home.                           - Resume regular diet today. High fiber diet  recommended.                           - Continue present medications.                           - Resume Xarelto (rivaroxaban) at prior dose                            tomorrow. Refer to managing physician for further                             adjustment of therapy.                           - Await pathology results.                           - Repeat colonoscopy in 3 years for surveillance if                            at least 3 polyps are adenomas. Thornton Park MD, MD 09/09/2018 5:30:49 PM This report has been signed electronically.

## 2018-09-13 ENCOUNTER — Telehealth: Payer: Self-pay

## 2018-09-13 NOTE — Telephone Encounter (Signed)
  Follow up Call-  Call back number 09/09/2018  Post procedure Call Back phone  # [phone]  Permission to leave phone message Yes  Some recent data might be hidden     Patient questions:  Do you have a fever, pain , or abdominal swelling? No. Pain Score  0 *  Have you tolerated food without any problems? Yes.    Have you been able to return to your normal activities? Yes.    Do you have any questions about your discharge instructions: Diet   No. Medications  No. Follow up visit  No.  Do you have questions or concerns about your Care? No.  Actions: * If pain score is 4 or above: No action needed, pain <4.  1. Have you developed a fever since your procedure? no  2.   Have you had an respiratory symptoms (SOB or cough) since your procedure? no  3.   Have you tested positive for COVID 19 since your procedure no  4.   Have you had any family members/close contacts diagnosed with the COVID 19 since your procedure?  no   If yes to any of these questions please route to Joylene John, RN and Alphonsa Gin, Therapist, sports.

## 2018-09-14 ENCOUNTER — Ambulatory Visit (INDEPENDENT_AMBULATORY_CARE_PROVIDER_SITE_OTHER): Payer: Medicare HMO | Admitting: Acute Care

## 2018-09-14 ENCOUNTER — Telehealth: Payer: Self-pay

## 2018-09-14 ENCOUNTER — Encounter: Payer: Self-pay | Admitting: Gastroenterology

## 2018-09-14 ENCOUNTER — Encounter: Payer: Self-pay | Admitting: Acute Care

## 2018-09-14 ENCOUNTER — Other Ambulatory Visit: Payer: Self-pay

## 2018-09-14 VITALS — BP 124/80 | HR 75 | Temp 98.1°F | Ht 69.0 in | Wt 282.2 lb

## 2018-09-14 DIAGNOSIS — R0609 Other forms of dyspnea: Secondary | ICD-10-CM

## 2018-09-14 DIAGNOSIS — F329 Major depressive disorder, single episode, unspecified: Secondary | ICD-10-CM

## 2018-09-14 DIAGNOSIS — Z9229 Personal history of other drug therapy: Secondary | ICD-10-CM

## 2018-09-14 DIAGNOSIS — R0600 Dyspnea, unspecified: Secondary | ICD-10-CM

## 2018-09-14 DIAGNOSIS — R69 Illness, unspecified: Secondary | ICD-10-CM | POA: Diagnosis not present

## 2018-09-14 DIAGNOSIS — I509 Heart failure, unspecified: Secondary | ICD-10-CM

## 2018-09-14 DIAGNOSIS — F32A Depression, unspecified: Secondary | ICD-10-CM

## 2018-09-14 NOTE — Telephone Encounter (Signed)
Kristine [name] pt needs PFT scheduled within as soon as possible.

## 2018-09-14 NOTE — Progress Notes (Addendum)
History of Present Illness Kristine [name] is a 73 y.o. female with chronic dyspnea on exertion, DM, CHF,  atrial fibrillation, and history of amiodarone therapy. She is followed by Dr. Annamaria Boots and Dr. Vaughan Browner.    Synopsis 73 year old femalenever smokerwith a history of diabetes mellitus type 2, atrial fibrillation on anticoagulation with Xarelto, morbid obesity with BMI 41.28 kg/m, hypertension, hyperlipidemia, Charcot foot status post surgery on 11/22/2019She has hard to control atrial fibrillation. 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 She complains of persistent dyspnea on exertion. She does not have any dyspnea at rest or wheezing. She denies any cough, sputum production, fevers, chills. She reports a reaction to albuterol with palpitations and tachycardia. She is currently on Xopenex and uses it very rarely. She is a retired Therapist, sports. She does not have any relevant exposures.Recent Surgery forCharcot foot status post surgery on 02/11/2018.She was hospitalized 3/13-3/19 with acute hypoxic respiratory failure that was 2/2 acute on chronic heart failure. ( CXR with cardiomegally , vascular congestion,Mild interstitial opacities are nonspecific but may represent mild interstitial edema). She was treated with diuretics and  told to follow up with Pulmonary upon discharge.She has been seen in the office by both Dr. Vaughan Browner and Dr. Annamaria Boots.    09/14/2018 Office Visit Pt. Presents for follow up. She was last seen 06/2018 as follow up to a recent hospitalization 06/03/2018 - 06/09/2018. She had been admitted for acute on chronic heart failure. At that time she was adding 3 days of Metolazone 2.5 mg  per [facility] in addition to her usual lasix dosing for volume overload. She presents today  for follow up. She states she has been doing well. She is compliant with all medications . She states her weight is up 8 pounds . She did call her PCP  This last week and they  gave her an additional dpse  lasix. She states she did take this. She states the sedentary lifestyle of the virus has impacted her ability to become less sedentary. She states she has some depression.She has charcot foot and had surgery 01/2018. She has been in a wheel chair or walker for the last 8 months. She is using her xopenex nebs for break through shortness of breath. She has only had to use these 8 times since she was discahrged from the hospital 06/09/2018. She is not using her Pulmicort. She states she does not know that it will help her. She has resumed singing and playing the church organ again. She and her husband have been out more. She is wearing her  Mask. We reviewed safe social distancing. She does still have atrial fibrillation. It is rate controlled. She follows with cardiology closely.She denies fever, chest pain, orthopnea or hemoptysis.   Test Results: Echo 05/2018 The left ventricle has low normal systolic function, with an ejection fraction of 50-55%. The cavity size was normal. Left ventricular diastolic function could not be evaluated secondary to atrial fibrillation. There is right ventricular volume and  pressure overload. The right ventricle has severely reduced systolic function. The cavity was moderately enlarged. There is mildly increased right ventricular wall thickness. The mitral valve is grossly normal. Mild thickening of the mitral valve leaflet. There is mild mitral annular calcification present. Mitral valve regurgitation is moderate by color flow Doppler. Mildly thickened tricuspid  valve leaflets. The tricuspid valve is grossly normal. Tricuspid valve regurgitation is moderate. The aortic valve is tricuspid. Moderate annular calcification. The aortic root is normal in size and structure.  Right atrial size was mildly dilated. Negative saline bubble study for PFO.  CXR 05/30/15- RUL infiltrate. Images reviewed  PFT-12/05/09- Mild restriction TLC 73%, DLCO 69%, Spirometry WNL w/ resp to BD 6MWT-12/05/09- 100%, 98%, 98% 368 m.  PFTs 11/19/15 FVC 3.61 (69%) FEV1 2.75 (69%) F/F 76 TLC 82% DLCO 49% Moderate-severe diffusion defect.  High res CT 01/23/16  Patchy mild regurgitation, faint groundglass opacities. Nonspecific findings not clearly consistent with amiodarone toxicity.    CBC Latest Ref Rng & Units 06/04/2018 06/03/2018 08/21/2017  WBC 4.0 - 10.5 K/uL 7.1 7.5 11.8(H)  Hemoglobin 12.0 - 15.0 g/dL 11.1(L) 11.6(L) 13.7  Hematocrit 36.0 - 46.0 % 36.3 39.0 41.7  Platelets 150 - 400 K/uL 170 180 185    BMP Latest Ref Rng & Units 06/09/2018 06/08/2018 06/07/2018  Glucose 70 - 99 mg/dL 176(H) 99 155(H)  BUN 8 - 23 mg/dL 37(H) 38(H) 45(H)  Creatinine 0.44 - 1.00 mg/dL 2.05(H) 1.92(H) 1.96(H)  BUN/Creat Ratio 12 - 28 - - -  Sodium 135 - 145 mmol/L 139 141 142  Potassium 3.5 - 5.1 mmol/L 4.0 3.2(L) 4.0  Chloride 98 - 111 mmol/L 102 105 102  CO2 22 - 32 mmol/L 27 30 31   Calcium 8.9 - 10.3 mg/dL 9.0 8.5(L) 8.8(L)    BNP    Component Value Date/Time   BNP 198.3 (H) 06/03/2018 1117    ProBNP    Component Value Date/Time   PROBNP 354.0 (H) 04/19/2010 0355    PFT    Component Value Date/Time   FEV1PRE 1.92 11/19/2015 1054   FVCPRE 2.50 11/19/2015 1054   TLC 4.74 11/19/2015 1054   DLCOUNC 15.14 11/19/2015 1054   PREFEV1FVCRT 77 11/19/2015 1054    Mm 3d Screen Breast Bilateral  Result Date: 08/29/2018 CLINICAL DATA:  Screening. EXAM: DIGITAL SCREENING BILATERAL MAMMOGRAM WITH TOMO AND CAD COMPARISON:  Previous exam(s). ACR Breast Density Category b: There are scattered areas of fibroglandular density. FINDINGS: There are no findings suspicious for malignancy. Images were processed with CAD. IMPRESSION: No mammographic evidence of malignancy. A result letter of  this screening mammogram will be mailed directly to the patient. RECOMMENDATION: Screening mammogram in one year. (Code:SM-B-01Y) BI-RADS CATEGORY  1: Negative. Electronically Signed   By: Abelardo Diesel M.D.   On: 08/29/2018 11:46     Past medical hx Past Medical History:  Diagnosis Date  . Allergy   . Anxiety   . Arthritis   . Asthma   . Atrial fibrillation ([place])   . Cataract   . CHF (congestive heart failure) ([place])   . Chronic bronchitis ([place])   . Chronic kidney disease   . Clotting disorder ([place])   . Depression   . GERD (gastroesophageal reflux disease)   . High cholesterol   . History of hiatal hernia   . HTN (hypertension)   . 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 Morbid obesity ([place])   . Neuromuscular disorder ([place])   . Osteoporosis   . Persistent atrial fibrillation   . Scoliosis   . Spinal stenosis   . Thyroid disease   . Type II diabetes mellitus ([place])   . Vitamin D deficiency      Social History   Tobacco Use  . Smoking status: Never Smoker  . Smokeless tobacco: Never Used  Substance Use Topics  .  Alcohol use: No  . Drug use: No    Ms.Mcdanel reports that she has never smoked. She has never used smokeless tobacco. She reports that she does not drink alcohol or use drugs.  Tobacco Cessation: Never smoker  She had second hand exposure from birth to 19 years daily  Past surgical hx, Family hx, Social hx all reviewed.  Current Outpatient Medications on File Prior to Visit  Medication Sig  . ALPRAZolam (XANAX) 0.25 MG tablet Take 0.125-0.25 mg by mouth 2 (two) times daily as needed for anxiety.   . BD PEN NEEDLE NANO U/F 32G X 4 MM MISC Inject 1 each as directed See admin instructions. Use pen needles with insulin pens daily  . budesonide (PULMICORT) 0.25 MG/2ML nebulizer solution Take 2 mLs (0.25 mg total) by nebulization 2 (two) times daily.  Marland Kitchen estradiol (ESTRACE) 0.1 MG/GM vaginal cream Place 1 Applicatorful vaginally as needed (Estrace).   . furosemide  (LASIX) 80 MG tablet Take 80 mg by mouth 3 (three) times daily.  . insulin aspart (NOVOLOG FLEXPEN) 100 UNIT/ML FlexPen Inject 30 Units into the skin 3 (three) times daily with meals. Plus sliding scale  . Insulin Degludec (TRESIBA FLEXTOUCH) 200 UNIT/ML SOPN Inject 100 Units into the skin every morning.   Marland Kitchen KLOR-CON M20 20 MEQ tablet Take 60 mEq by mouth 2 (two) times daily.   Marland Kitchen levalbuterol (XOPENEX) 0.63 MG/3ML nebulizer solution Take 3 mLs (0.63 mg total) by nebulization every 8 (eight) hours as needed for up to 30 days for wheezing or shortness of breath.  . levothyroxine (SYNTHROID, LEVOTHROID) 50 MCG tablet Take 50 mcg by mouth daily.   Marland Kitchen LYRICA 50 MG capsule Take 50-150 mg by mouth See admin instructions. Take 50 mg in the morning 50 gm at lunch ups to 150 at bedtime  . metolazone (ZAROXOLYN) 2.5 MG tablet Take 1 tablet by mouth 3 (three) times a week. Mon, Wed, Fri  . metoprolol succinate (TOPROL-XL) 50 MG 24 hr tablet Take 50 mg by mouth 2 (two) times daily. Take with or immediately following a meal.   . ULORIC 80 MG TABS Take 1 tablet by mouth daily.  . Vitamin D, Ergocalciferol, (DRISDOL) 50000 UNITS CAPS Take 50,000 Units by mouth every 7 (seven) days. Takes on Thursday  . XARELTO 15 MG TABS tablet Take 15 mg by mouth daily with supper.    No current facility-administered medications on file prior to visit.      Allergies  Allergen Reactions  . Albuterol Other (See Comments)    REACTION: Tachycardia- AFib  . Epinephrine Other (See Comments)    Increases heart rate per patient.   . Penicillins Other (See Comments)    REACTION: flushing \\T \ hot  Did it involve swelling of the face/tongue/throat, SOB, or low BP? No Did it involve sudden or severe rash/hives, skin peeling, or any reaction on the inside of your mouth or nose? No Did you need to seek medical attention at a hospital or doctor's office? No When did it last happen?20 years ago If all above answers are "NO", may  proceed with cephalosporin use.  . Torsemide Swelling  . Bee Venom Other (See Comments)    "makes me nervous"  . Ivp Dye [Iodinated Diagnostic Agents] Other (See Comments)    flushing    Review Of Systems:  Constitutional:   No  weight loss, night sweats,  Fevers, chills,+  fatigue, or  lassitude.  HEENT:   No headaches,  Difficulty swallowing,  Tooth/dental  problems, or  Sore throat,                No sneezing, itching, ear ache, nasal congestion, post nasal drip,   CV:  No chest pain,  + 2 pillow Orthopnea, PND, + swelling in lower extremities, No anasarca, dizziness, palpitations, syncope.   GI  No heartburn, indigestion, abdominal pain, nausea, vomiting, diarrhea, change in bowel habits, loss of appetite, bloody stools.   Resp: + exertional  shortness of breath none  at rest.  No excess mucus, no productive cough,  No non-productive cough,  No coughing up of blood.  No change in color of mucus.  No wheezing.  No chest wall deformity  Skin: no rash or lesions.  GU: no dysuria, change in color of urine, no urgency or frequency.  No flank pain, no hematuria   MS:  L foot  joint pain or swelling.  + decreased range of motion.  No back pain.  Psych:  No change in mood or affect. +  depression or + anxiety.  No memory loss.   Vital Signs BP 124/80 (BP Location: Left Arm, Cuff Size: Large)   Pulse 75   Temp 98.1 F (36.7 C) (Oral)   Ht 5\' 9"  (1.753 m)   Wt 282 lb 3.2 oz (128 kg)   SpO2 94%   BMI 41.67 kg/m    Physical Exam:  General- No distress,  A&Ox3, pleasant ENT: No sinus tenderness, TM clear, pale nasal mucosa, no oral exudate,no post nasal drip, no LAN Cardiac: S1, S2, regular rate and rhythm, no murmur Chest: No wheeze/ rales/ dullness; no accessory muscle use, no nasal flaring, no sternal retractions, diminished per bases Abd.: Soft Non-tender, ND, BS+, Body mass index is 41.67 kg/m. Ext: No clubbing cyanosis, 1+ BLE edema Neuro:  Deconditioned, in a  wheelchair, MAE x 4, A&O x 3 Skin: No rashes, warm and dry, few bruises noted Psych: normal mood and behavior   Assessment/Plan  Exertional dyspnea PFT's 2017 with mild obstruction and moderate to severe diffusion deficit Previous amiodarone therapy>> discontinued 01/2016 Confusion about her maintenance vs. Her rescue medications Plan: Continue your Budesonide  nebs twice daily as you have been doing Continue your Xopenex nebs as needed for breakthrough shortness of breath. We will schedule PFT's We will schedule an HRCT Pulmonary Rehab once safe Continue Physical Therapy for rehab of your left foot.  CHF Plan Continue Lasix TID per cards/ and PCP  Follow low salt diet Weigh daily Call your PCP if you notice your weight is up.  Depression Follow up with PCP Try to remain active as able during the pandemic  Obesity Plan Continue to work on losing weight Increase your activity and exercise as able Continue working with physical therapy  CKD Creatinine 2.05 05/2017 Plan Follow up with renal as you have been doing    Magdalen Spatz, NP 09/14/2018  11:31 AM

## 2018-09-14 NOTE — Patient Instructions (Addendum)
It is good to see you today. Continue your Budesonide ( Pulmicort)   nebs twice daily ( Maintenance) Continue your Xopenex nebs as needed for breakthrough shortness of breath. ( Rescue)  We will schedule PFT's We will schedule an HRCT Someone will call you to schedule both. We will call you with the results Pulmonary Rehab once safe Continue your home Physical therapy Continue Lasix three times daily  per cards/ and PCP  Follow low salt diet 32 ounce fluid restriction Weigh daily When you see Dr. Dagmar Hait next week ask him about something for yur depression. Please contact office for sooner follow up if symptoms do not improve or worsen or seek emergency care Follow up with Dr.  Vaughan Browner or Dr. Annamaria Boots in 3 months.

## 2018-09-20 DIAGNOSIS — H04123 Dry eye syndrome of bilateral lacrimal glands: Secondary | ICD-10-CM | POA: Diagnosis not present

## 2018-09-20 DIAGNOSIS — Z961 Presence of intraocular lens: Secondary | ICD-10-CM | POA: Diagnosis not present

## 2018-09-20 DIAGNOSIS — E113393 Type 2 diabetes mellitus with moderate nonproliferative diabetic retinopathy without macular edema, bilateral: Secondary | ICD-10-CM | POA: Diagnosis not present

## 2018-09-22 DIAGNOSIS — E038 Other specified hypothyroidism: Secondary | ICD-10-CM | POA: Diagnosis not present

## 2018-09-22 DIAGNOSIS — E1159 Type 2 diabetes mellitus with other circulatory complications: Secondary | ICD-10-CM | POA: Diagnosis not present

## 2018-09-26 DIAGNOSIS — E11610 Type 2 diabetes mellitus with diabetic neuropathic arthropathy: Secondary | ICD-10-CM | POA: Diagnosis not present

## 2018-09-27 ENCOUNTER — Other Ambulatory Visit: Payer: Self-pay

## 2018-09-27 ENCOUNTER — Ambulatory Visit (INDEPENDENT_AMBULATORY_CARE_PROVIDER_SITE_OTHER)
Admission: RE | Admit: 2018-09-27 | Discharge: 2018-09-27 | Disposition: A | Discharge status: Home / Self Care | Payer: Medicare HMO | Source: Ambulatory Visit | Attending: Acute Care | Admitting: Acute Care

## 2018-09-27 DIAGNOSIS — R69 Illness, unspecified: Secondary | ICD-10-CM | POA: Diagnosis not present

## 2018-09-27 DIAGNOSIS — N184 Chronic kidney disease, stage 4 (severe): Secondary | ICD-10-CM | POA: Diagnosis not present

## 2018-09-27 DIAGNOSIS — R918 Other nonspecific abnormal finding of lung field: Secondary | ICD-10-CM | POA: Diagnosis not present

## 2018-09-27 DIAGNOSIS — R0609 Other forms of dyspnea: Secondary | ICD-10-CM | POA: Diagnosis not present

## 2018-09-27 DIAGNOSIS — I130 Hypertensive heart and chronic kidney disease with heart failure and stage 1 through stage 4 chronic kidney disease, or unspecified chronic kidney disease: Secondary | ICD-10-CM | POA: Diagnosis not present

## 2018-09-27 DIAGNOSIS — M14679 Charcot's joint, unspecified ankle and foot: Secondary | ICD-10-CM | POA: Diagnosis not present

## 2018-09-27 DIAGNOSIS — I509 Heart failure, unspecified: Secondary | ICD-10-CM | POA: Diagnosis not present

## 2018-09-27 DIAGNOSIS — D126 Benign neoplasm of colon, unspecified: Secondary | ICD-10-CM | POA: Diagnosis not present

## 2018-09-27 DIAGNOSIS — E039 Hypothyroidism, unspecified: Secondary | ICD-10-CM | POA: Diagnosis not present

## 2018-09-27 DIAGNOSIS — R0600 Dyspnea, unspecified: Secondary | ICD-10-CM

## 2018-09-27 DIAGNOSIS — Z9229 Personal history of other drug therapy: Secondary | ICD-10-CM

## 2018-09-27 DIAGNOSIS — E1159 Type 2 diabetes mellitus with other circulatory complications: Secondary | ICD-10-CM | POA: Diagnosis not present

## 2018-09-28 DIAGNOSIS — E109 Type 1 diabetes mellitus without complications: Secondary | ICD-10-CM | POA: Diagnosis not present

## 2018-09-28 DIAGNOSIS — Z794 Long term (current) use of insulin: Secondary | ICD-10-CM | POA: Diagnosis not present

## 2018-09-28 DIAGNOSIS — E876 Hypokalemia: Secondary | ICD-10-CM | POA: Diagnosis not present

## 2018-10-02 DIAGNOSIS — M6281 Muscle weakness (generalized): Secondary | ICD-10-CM | POA: Diagnosis not present

## 2018-10-02 DIAGNOSIS — E1140 Type 2 diabetes mellitus with diabetic neuropathy, unspecified: Secondary | ICD-10-CM | POA: Diagnosis not present

## 2018-10-02 DIAGNOSIS — R2689 Other abnormalities of gait and mobility: Secondary | ICD-10-CM | POA: Diagnosis not present

## 2018-10-02 DIAGNOSIS — M1990 Unspecified osteoarthritis, unspecified site: Secondary | ICD-10-CM | POA: Diagnosis not present

## 2018-10-03 DIAGNOSIS — R69 Illness, unspecified: Secondary | ICD-10-CM | POA: Diagnosis not present

## 2018-10-03 DIAGNOSIS — J42 Unspecified chronic bronchitis: Secondary | ICD-10-CM | POA: Diagnosis not present

## 2018-10-03 DIAGNOSIS — I5032 Chronic diastolic (congestive) heart failure: Secondary | ICD-10-CM | POA: Diagnosis not present

## 2018-10-03 DIAGNOSIS — E11610 Type 2 diabetes mellitus with diabetic neuropathic arthropathy: Secondary | ICD-10-CM | POA: Diagnosis not present

## 2018-10-03 DIAGNOSIS — N184 Chronic kidney disease, stage 4 (severe): Secondary | ICD-10-CM | POA: Diagnosis not present

## 2018-10-03 DIAGNOSIS — E1122 Type 2 diabetes mellitus with diabetic chronic kidney disease: Secondary | ICD-10-CM | POA: Diagnosis not present

## 2018-10-03 DIAGNOSIS — M810 Age-related osteoporosis without current pathological fracture: Secondary | ICD-10-CM | POA: Diagnosis not present

## 2018-10-03 DIAGNOSIS — J45909 Unspecified asthma, uncomplicated: Secondary | ICD-10-CM | POA: Diagnosis not present

## 2018-10-03 DIAGNOSIS — I429 Cardiomyopathy, unspecified: Secondary | ICD-10-CM | POA: Diagnosis not present

## 2018-10-03 DIAGNOSIS — I130 Hypertensive heart and chronic kidney disease with heart failure and stage 1 through stage 4 chronic kidney disease, or unspecified chronic kidney disease: Secondary | ICD-10-CM | POA: Diagnosis not present

## 2018-10-06 DIAGNOSIS — M810 Age-related osteoporosis without current pathological fracture: Secondary | ICD-10-CM | POA: Diagnosis not present

## 2018-10-06 DIAGNOSIS — I429 Cardiomyopathy, unspecified: Secondary | ICD-10-CM | POA: Diagnosis not present

## 2018-10-06 DIAGNOSIS — E1122 Type 2 diabetes mellitus with diabetic chronic kidney disease: Secondary | ICD-10-CM | POA: Diagnosis not present

## 2018-10-06 DIAGNOSIS — I130 Hypertensive heart and chronic kidney disease with heart failure and stage 1 through stage 4 chronic kidney disease, or unspecified chronic kidney disease: Secondary | ICD-10-CM | POA: Diagnosis not present

## 2018-10-06 DIAGNOSIS — N184 Chronic kidney disease, stage 4 (severe): Secondary | ICD-10-CM | POA: Diagnosis not present

## 2018-10-06 DIAGNOSIS — I5032 Chronic diastolic (congestive) heart failure: Secondary | ICD-10-CM | POA: Diagnosis not present

## 2018-10-06 DIAGNOSIS — R69 Illness, unspecified: Secondary | ICD-10-CM | POA: Diagnosis not present

## 2018-10-06 DIAGNOSIS — J42 Unspecified chronic bronchitis: Secondary | ICD-10-CM | POA: Diagnosis not present

## 2018-10-06 DIAGNOSIS — E11610 Type 2 diabetes mellitus with diabetic neuropathic arthropathy: Secondary | ICD-10-CM | POA: Diagnosis not present

## 2018-10-06 DIAGNOSIS — J45909 Unspecified asthma, uncomplicated: Secondary | ICD-10-CM | POA: Diagnosis not present

## 2018-10-07 DIAGNOSIS — E1122 Type 2 diabetes mellitus with diabetic chronic kidney disease: Secondary | ICD-10-CM | POA: Diagnosis not present

## 2018-10-07 DIAGNOSIS — R69 Illness, unspecified: Secondary | ICD-10-CM | POA: Diagnosis not present

## 2018-10-07 DIAGNOSIS — E11610 Type 2 diabetes mellitus with diabetic neuropathic arthropathy: Secondary | ICD-10-CM | POA: Diagnosis not present

## 2018-10-07 DIAGNOSIS — N184 Chronic kidney disease, stage 4 (severe): Secondary | ICD-10-CM | POA: Diagnosis not present

## 2018-10-07 DIAGNOSIS — I5032 Chronic diastolic (congestive) heart failure: Secondary | ICD-10-CM | POA: Diagnosis not present

## 2018-10-07 DIAGNOSIS — J45909 Unspecified asthma, uncomplicated: Secondary | ICD-10-CM | POA: Diagnosis not present

## 2018-10-07 DIAGNOSIS — I130 Hypertensive heart and chronic kidney disease with heart failure and stage 1 through stage 4 chronic kidney disease, or unspecified chronic kidney disease: Secondary | ICD-10-CM | POA: Diagnosis not present

## 2018-10-07 DIAGNOSIS — J42 Unspecified chronic bronchitis: Secondary | ICD-10-CM | POA: Diagnosis not present

## 2018-10-07 DIAGNOSIS — I429 Cardiomyopathy, unspecified: Secondary | ICD-10-CM | POA: Diagnosis not present

## 2018-10-07 DIAGNOSIS — M810 Age-related osteoporosis without current pathological fracture: Secondary | ICD-10-CM | POA: Diagnosis not present

## 2018-10-10 DIAGNOSIS — R69 Illness, unspecified: Secondary | ICD-10-CM | POA: Diagnosis not present

## 2018-10-10 DIAGNOSIS — E11610 Type 2 diabetes mellitus with diabetic neuropathic arthropathy: Secondary | ICD-10-CM | POA: Diagnosis not present

## 2018-10-10 DIAGNOSIS — I429 Cardiomyopathy, unspecified: Secondary | ICD-10-CM | POA: Diagnosis not present

## 2018-10-10 DIAGNOSIS — E1122 Type 2 diabetes mellitus with diabetic chronic kidney disease: Secondary | ICD-10-CM | POA: Diagnosis not present

## 2018-10-10 DIAGNOSIS — I130 Hypertensive heart and chronic kidney disease with heart failure and stage 1 through stage 4 chronic kidney disease, or unspecified chronic kidney disease: Secondary | ICD-10-CM | POA: Diagnosis not present

## 2018-10-10 DIAGNOSIS — I5032 Chronic diastolic (congestive) heart failure: Secondary | ICD-10-CM | POA: Diagnosis not present

## 2018-10-10 DIAGNOSIS — N184 Chronic kidney disease, stage 4 (severe): Secondary | ICD-10-CM | POA: Diagnosis not present

## 2018-10-10 DIAGNOSIS — J45909 Unspecified asthma, uncomplicated: Secondary | ICD-10-CM | POA: Diagnosis not present

## 2018-10-10 DIAGNOSIS — J42 Unspecified chronic bronchitis: Secondary | ICD-10-CM | POA: Diagnosis not present

## 2018-10-10 DIAGNOSIS — M810 Age-related osteoporosis without current pathological fracture: Secondary | ICD-10-CM | POA: Diagnosis not present

## 2018-10-10 NOTE — Telephone Encounter (Signed)
Called and left message on pt vm to call back to schedule PFT -pr  °

## 2018-10-11 NOTE — Telephone Encounter (Signed)
Called and left message on pt vm to call back to schedule PFT -pr  °

## 2018-10-12 DIAGNOSIS — R69 Illness, unspecified: Secondary | ICD-10-CM | POA: Diagnosis not present

## 2018-10-12 DIAGNOSIS — N184 Chronic kidney disease, stage 4 (severe): Secondary | ICD-10-CM | POA: Diagnosis not present

## 2018-10-12 DIAGNOSIS — J45909 Unspecified asthma, uncomplicated: Secondary | ICD-10-CM | POA: Diagnosis not present

## 2018-10-12 DIAGNOSIS — J42 Unspecified chronic bronchitis: Secondary | ICD-10-CM | POA: Diagnosis not present

## 2018-10-12 DIAGNOSIS — I429 Cardiomyopathy, unspecified: Secondary | ICD-10-CM | POA: Diagnosis not present

## 2018-10-12 DIAGNOSIS — E11610 Type 2 diabetes mellitus with diabetic neuropathic arthropathy: Secondary | ICD-10-CM | POA: Diagnosis not present

## 2018-10-12 DIAGNOSIS — I5032 Chronic diastolic (congestive) heart failure: Secondary | ICD-10-CM | POA: Diagnosis not present

## 2018-10-12 DIAGNOSIS — M810 Age-related osteoporosis without current pathological fracture: Secondary | ICD-10-CM | POA: Diagnosis not present

## 2018-10-12 DIAGNOSIS — I130 Hypertensive heart and chronic kidney disease with heart failure and stage 1 through stage 4 chronic kidney disease, or unspecified chronic kidney disease: Secondary | ICD-10-CM | POA: Diagnosis not present

## 2018-10-12 DIAGNOSIS — E1122 Type 2 diabetes mellitus with diabetic chronic kidney disease: Secondary | ICD-10-CM | POA: Diagnosis not present

## 2018-10-12 NOTE — Telephone Encounter (Signed)
Called and left message on pt vm to call back to schedule PFT - 3rd attempt - closed message - pr

## 2018-10-12 NOTE — Telephone Encounter (Signed)
Patrice aware pt needs PFT scheduling.  

## 2018-10-13 DIAGNOSIS — I429 Cardiomyopathy, unspecified: Secondary | ICD-10-CM | POA: Diagnosis not present

## 2018-10-13 DIAGNOSIS — R69 Illness, unspecified: Secondary | ICD-10-CM | POA: Diagnosis not present

## 2018-10-13 DIAGNOSIS — I130 Hypertensive heart and chronic kidney disease with heart failure and stage 1 through stage 4 chronic kidney disease, or unspecified chronic kidney disease: Secondary | ICD-10-CM | POA: Diagnosis not present

## 2018-10-13 DIAGNOSIS — E11610 Type 2 diabetes mellitus with diabetic neuropathic arthropathy: Secondary | ICD-10-CM | POA: Diagnosis not present

## 2018-10-13 DIAGNOSIS — I5032 Chronic diastolic (congestive) heart failure: Secondary | ICD-10-CM | POA: Diagnosis not present

## 2018-10-13 DIAGNOSIS — N184 Chronic kidney disease, stage 4 (severe): Secondary | ICD-10-CM | POA: Diagnosis not present

## 2018-10-13 DIAGNOSIS — J45909 Unspecified asthma, uncomplicated: Secondary | ICD-10-CM | POA: Diagnosis not present

## 2018-10-13 DIAGNOSIS — J42 Unspecified chronic bronchitis: Secondary | ICD-10-CM | POA: Diagnosis not present

## 2018-10-13 DIAGNOSIS — E1122 Type 2 diabetes mellitus with diabetic chronic kidney disease: Secondary | ICD-10-CM | POA: Diagnosis not present

## 2018-10-13 DIAGNOSIS — Z961 Presence of intraocular lens: Secondary | ICD-10-CM | POA: Diagnosis not present

## 2018-10-13 DIAGNOSIS — M810 Age-related osteoporosis without current pathological fracture: Secondary | ICD-10-CM | POA: Diagnosis not present

## 2018-10-13 DIAGNOSIS — E113392 Type 2 diabetes mellitus with moderate nonproliferative diabetic retinopathy without macular edema, left eye: Secondary | ICD-10-CM | POA: Diagnosis not present

## 2018-10-13 DIAGNOSIS — E113391 Type 2 diabetes mellitus with moderate nonproliferative diabetic retinopathy without macular edema, right eye: Secondary | ICD-10-CM | POA: Diagnosis not present

## 2018-10-14 ENCOUNTER — Telehealth: Payer: Self-pay

## 2018-10-14 NOTE — Telephone Encounter (Signed)
Pt came in today to see SG for an appointment but was not scheduled today. I looked back in the chart and saw that Sharl Ma was trying to schedule a PFT appointment and SG advised to make a follow up appt in 3 months. Sharl Ma can you call [name] her husband at [phone] to schedule PFT and appt in 3 months. Thanks

## 2018-10-17 DIAGNOSIS — E1122 Type 2 diabetes mellitus with diabetic chronic kidney disease: Secondary | ICD-10-CM | POA: Diagnosis not present

## 2018-10-17 DIAGNOSIS — I130 Hypertensive heart and chronic kidney disease with heart failure and stage 1 through stage 4 chronic kidney disease, or unspecified chronic kidney disease: Secondary | ICD-10-CM | POA: Diagnosis not present

## 2018-10-17 DIAGNOSIS — M810 Age-related osteoporosis without current pathological fracture: Secondary | ICD-10-CM | POA: Diagnosis not present

## 2018-10-17 DIAGNOSIS — N184 Chronic kidney disease, stage 4 (severe): Secondary | ICD-10-CM | POA: Diagnosis not present

## 2018-10-17 DIAGNOSIS — I5032 Chronic diastolic (congestive) heart failure: Secondary | ICD-10-CM | POA: Diagnosis not present

## 2018-10-17 DIAGNOSIS — J42 Unspecified chronic bronchitis: Secondary | ICD-10-CM | POA: Diagnosis not present

## 2018-10-17 DIAGNOSIS — J45909 Unspecified asthma, uncomplicated: Secondary | ICD-10-CM | POA: Diagnosis not present

## 2018-10-17 DIAGNOSIS — I429 Cardiomyopathy, unspecified: Secondary | ICD-10-CM | POA: Diagnosis not present

## 2018-10-17 DIAGNOSIS — E11610 Type 2 diabetes mellitus with diabetic neuropathic arthropathy: Secondary | ICD-10-CM | POA: Diagnosis not present

## 2018-10-17 DIAGNOSIS — R69 Illness, unspecified: Secondary | ICD-10-CM | POA: Diagnosis not present

## 2018-10-17 NOTE — Telephone Encounter (Signed)
Called and spoke to pt - scheduled PFT and f/u with Dr. Vaughan Browner on 11/11/2018-pr

## 2018-10-18 DIAGNOSIS — I130 Hypertensive heart and chronic kidney disease with heart failure and stage 1 through stage 4 chronic kidney disease, or unspecified chronic kidney disease: Secondary | ICD-10-CM | POA: Diagnosis not present

## 2018-10-18 DIAGNOSIS — J45909 Unspecified asthma, uncomplicated: Secondary | ICD-10-CM | POA: Diagnosis not present

## 2018-10-18 DIAGNOSIS — I5032 Chronic diastolic (congestive) heart failure: Secondary | ICD-10-CM | POA: Diagnosis not present

## 2018-10-18 DIAGNOSIS — J42 Unspecified chronic bronchitis: Secondary | ICD-10-CM | POA: Diagnosis not present

## 2018-10-18 DIAGNOSIS — R69 Illness, unspecified: Secondary | ICD-10-CM | POA: Diagnosis not present

## 2018-10-18 DIAGNOSIS — E11610 Type 2 diabetes mellitus with diabetic neuropathic arthropathy: Secondary | ICD-10-CM | POA: Diagnosis not present

## 2018-10-18 DIAGNOSIS — I429 Cardiomyopathy, unspecified: Secondary | ICD-10-CM | POA: Diagnosis not present

## 2018-10-18 DIAGNOSIS — N184 Chronic kidney disease, stage 4 (severe): Secondary | ICD-10-CM | POA: Diagnosis not present

## 2018-10-18 DIAGNOSIS — M810 Age-related osteoporosis without current pathological fracture: Secondary | ICD-10-CM | POA: Diagnosis not present

## 2018-10-18 DIAGNOSIS — E1122 Type 2 diabetes mellitus with diabetic chronic kidney disease: Secondary | ICD-10-CM | POA: Diagnosis not present

## 2018-10-19 DIAGNOSIS — I130 Hypertensive heart and chronic kidney disease with heart failure and stage 1 through stage 4 chronic kidney disease, or unspecified chronic kidney disease: Secondary | ICD-10-CM | POA: Diagnosis not present

## 2018-10-19 DIAGNOSIS — R69 Illness, unspecified: Secondary | ICD-10-CM | POA: Diagnosis not present

## 2018-10-19 DIAGNOSIS — J42 Unspecified chronic bronchitis: Secondary | ICD-10-CM | POA: Diagnosis not present

## 2018-10-19 DIAGNOSIS — M810 Age-related osteoporosis without current pathological fracture: Secondary | ICD-10-CM | POA: Diagnosis not present

## 2018-10-19 DIAGNOSIS — J45909 Unspecified asthma, uncomplicated: Secondary | ICD-10-CM | POA: Diagnosis not present

## 2018-10-19 DIAGNOSIS — E11610 Type 2 diabetes mellitus with diabetic neuropathic arthropathy: Secondary | ICD-10-CM | POA: Diagnosis not present

## 2018-10-19 DIAGNOSIS — Z794 Long term (current) use of insulin: Secondary | ICD-10-CM | POA: Diagnosis not present

## 2018-10-19 DIAGNOSIS — N184 Chronic kidney disease, stage 4 (severe): Secondary | ICD-10-CM | POA: Diagnosis not present

## 2018-10-19 DIAGNOSIS — E1159 Type 2 diabetes mellitus with other circulatory complications: Secondary | ICD-10-CM | POA: Diagnosis not present

## 2018-10-19 DIAGNOSIS — I429 Cardiomyopathy, unspecified: Secondary | ICD-10-CM | POA: Diagnosis not present

## 2018-10-19 DIAGNOSIS — I5032 Chronic diastolic (congestive) heart failure: Secondary | ICD-10-CM | POA: Diagnosis not present

## 2018-10-19 DIAGNOSIS — E1122 Type 2 diabetes mellitus with diabetic chronic kidney disease: Secondary | ICD-10-CM | POA: Diagnosis not present

## 2018-10-20 ENCOUNTER — Other Ambulatory Visit: Payer: Self-pay | Admitting: Cardiovascular Disease

## 2018-10-20 MED ORDER — METOPROLOL SUCCINATE ER 50 MG PO TB24: 50.0000 mg | ORAL_TABLET | Freq: Two times a day (BID) | ORAL | 3 refills | Status: DC

## 2018-10-24 DIAGNOSIS — I429 Cardiomyopathy, unspecified: Secondary | ICD-10-CM | POA: Diagnosis not present

## 2018-10-24 DIAGNOSIS — E1122 Type 2 diabetes mellitus with diabetic chronic kidney disease: Secondary | ICD-10-CM | POA: Diagnosis not present

## 2018-10-24 DIAGNOSIS — I5032 Chronic diastolic (congestive) heart failure: Secondary | ICD-10-CM | POA: Diagnosis not present

## 2018-10-24 DIAGNOSIS — M810 Age-related osteoporosis without current pathological fracture: Secondary | ICD-10-CM | POA: Diagnosis not present

## 2018-10-24 DIAGNOSIS — I130 Hypertensive heart and chronic kidney disease with heart failure and stage 1 through stage 4 chronic kidney disease, or unspecified chronic kidney disease: Secondary | ICD-10-CM | POA: Diagnosis not present

## 2018-10-24 DIAGNOSIS — N184 Chronic kidney disease, stage 4 (severe): Secondary | ICD-10-CM | POA: Diagnosis not present

## 2018-10-24 DIAGNOSIS — J45909 Unspecified asthma, uncomplicated: Secondary | ICD-10-CM | POA: Diagnosis not present

## 2018-10-24 DIAGNOSIS — E11610 Type 2 diabetes mellitus with diabetic neuropathic arthropathy: Secondary | ICD-10-CM | POA: Diagnosis not present

## 2018-10-24 DIAGNOSIS — R69 Illness, unspecified: Secondary | ICD-10-CM | POA: Diagnosis not present

## 2018-10-24 DIAGNOSIS — J42 Unspecified chronic bronchitis: Secondary | ICD-10-CM | POA: Diagnosis not present

## 2018-10-25 DIAGNOSIS — E876 Hypokalemia: Secondary | ICD-10-CM | POA: Diagnosis not present

## 2018-10-27 DIAGNOSIS — E1122 Type 2 diabetes mellitus with diabetic chronic kidney disease: Secondary | ICD-10-CM | POA: Diagnosis not present

## 2018-10-27 DIAGNOSIS — N184 Chronic kidney disease, stage 4 (severe): Secondary | ICD-10-CM | POA: Diagnosis not present

## 2018-10-27 DIAGNOSIS — M810 Age-related osteoporosis without current pathological fracture: Secondary | ICD-10-CM | POA: Diagnosis not present

## 2018-10-27 DIAGNOSIS — I5032 Chronic diastolic (congestive) heart failure: Secondary | ICD-10-CM | POA: Diagnosis not present

## 2018-10-27 DIAGNOSIS — J42 Unspecified chronic bronchitis: Secondary | ICD-10-CM | POA: Diagnosis not present

## 2018-10-27 DIAGNOSIS — I429 Cardiomyopathy, unspecified: Secondary | ICD-10-CM | POA: Diagnosis not present

## 2018-10-27 DIAGNOSIS — E11610 Type 2 diabetes mellitus with diabetic neuropathic arthropathy: Secondary | ICD-10-CM | POA: Diagnosis not present

## 2018-10-27 DIAGNOSIS — I130 Hypertensive heart and chronic kidney disease with heart failure and stage 1 through stage 4 chronic kidney disease, or unspecified chronic kidney disease: Secondary | ICD-10-CM | POA: Diagnosis not present

## 2018-10-27 DIAGNOSIS — R69 Illness, unspecified: Secondary | ICD-10-CM | POA: Diagnosis not present

## 2018-10-27 DIAGNOSIS — J45909 Unspecified asthma, uncomplicated: Secondary | ICD-10-CM | POA: Diagnosis not present

## 2018-10-28 DIAGNOSIS — E109 Type 1 diabetes mellitus without complications: Secondary | ICD-10-CM | POA: Diagnosis not present

## 2018-10-28 DIAGNOSIS — Z794 Long term (current) use of insulin: Secondary | ICD-10-CM | POA: Diagnosis not present

## 2018-10-31 DIAGNOSIS — J42 Unspecified chronic bronchitis: Secondary | ICD-10-CM | POA: Diagnosis not present

## 2018-10-31 DIAGNOSIS — I429 Cardiomyopathy, unspecified: Secondary | ICD-10-CM | POA: Diagnosis not present

## 2018-10-31 DIAGNOSIS — M810 Age-related osteoporosis without current pathological fracture: Secondary | ICD-10-CM | POA: Diagnosis not present

## 2018-10-31 DIAGNOSIS — I130 Hypertensive heart and chronic kidney disease with heart failure and stage 1 through stage 4 chronic kidney disease, or unspecified chronic kidney disease: Secondary | ICD-10-CM | POA: Diagnosis not present

## 2018-10-31 DIAGNOSIS — R69 Illness, unspecified: Secondary | ICD-10-CM | POA: Diagnosis not present

## 2018-10-31 DIAGNOSIS — E11610 Type 2 diabetes mellitus with diabetic neuropathic arthropathy: Secondary | ICD-10-CM | POA: Diagnosis not present

## 2018-10-31 DIAGNOSIS — J45909 Unspecified asthma, uncomplicated: Secondary | ICD-10-CM | POA: Diagnosis not present

## 2018-10-31 DIAGNOSIS — E1122 Type 2 diabetes mellitus with diabetic chronic kidney disease: Secondary | ICD-10-CM | POA: Diagnosis not present

## 2018-10-31 DIAGNOSIS — N184 Chronic kidney disease, stage 4 (severe): Secondary | ICD-10-CM | POA: Diagnosis not present

## 2018-10-31 DIAGNOSIS — I5032 Chronic diastolic (congestive) heart failure: Secondary | ICD-10-CM | POA: Diagnosis not present

## 2018-11-02 DIAGNOSIS — M6281 Muscle weakness (generalized): Secondary | ICD-10-CM | POA: Diagnosis not present

## 2018-11-02 DIAGNOSIS — I5032 Chronic diastolic (congestive) heart failure: Secondary | ICD-10-CM | POA: Diagnosis not present

## 2018-11-02 DIAGNOSIS — E1122 Type 2 diabetes mellitus with diabetic chronic kidney disease: Secondary | ICD-10-CM | POA: Diagnosis not present

## 2018-11-02 DIAGNOSIS — J42 Unspecified chronic bronchitis: Secondary | ICD-10-CM | POA: Diagnosis not present

## 2018-11-02 DIAGNOSIS — M1990 Unspecified osteoarthritis, unspecified site: Secondary | ICD-10-CM | POA: Diagnosis not present

## 2018-11-02 DIAGNOSIS — E11610 Type 2 diabetes mellitus with diabetic neuropathic arthropathy: Secondary | ICD-10-CM | POA: Diagnosis not present

## 2018-11-02 DIAGNOSIS — M810 Age-related osteoporosis without current pathological fracture: Secondary | ICD-10-CM | POA: Diagnosis not present

## 2018-11-02 DIAGNOSIS — E1140 Type 2 diabetes mellitus with diabetic neuropathy, unspecified: Secondary | ICD-10-CM | POA: Diagnosis not present

## 2018-11-02 DIAGNOSIS — R69 Illness, unspecified: Secondary | ICD-10-CM | POA: Diagnosis not present

## 2018-11-02 DIAGNOSIS — I429 Cardiomyopathy, unspecified: Secondary | ICD-10-CM | POA: Diagnosis not present

## 2018-11-02 DIAGNOSIS — J45909 Unspecified asthma, uncomplicated: Secondary | ICD-10-CM | POA: Diagnosis not present

## 2018-11-02 DIAGNOSIS — I130 Hypertensive heart and chronic kidney disease with heart failure and stage 1 through stage 4 chronic kidney disease, or unspecified chronic kidney disease: Secondary | ICD-10-CM | POA: Diagnosis not present

## 2018-11-02 DIAGNOSIS — N184 Chronic kidney disease, stage 4 (severe): Secondary | ICD-10-CM | POA: Diagnosis not present

## 2018-11-02 DIAGNOSIS — R2689 Other abnormalities of gait and mobility: Secondary | ICD-10-CM | POA: Diagnosis not present

## 2018-11-07 DIAGNOSIS — E11610 Type 2 diabetes mellitus with diabetic neuropathic arthropathy: Secondary | ICD-10-CM | POA: Diagnosis not present

## 2018-11-08 DIAGNOSIS — I130 Hypertensive heart and chronic kidney disease with heart failure and stage 1 through stage 4 chronic kidney disease, or unspecified chronic kidney disease: Secondary | ICD-10-CM | POA: Diagnosis not present

## 2018-11-08 DIAGNOSIS — I5032 Chronic diastolic (congestive) heart failure: Secondary | ICD-10-CM | POA: Diagnosis not present

## 2018-11-08 DIAGNOSIS — R69 Illness, unspecified: Secondary | ICD-10-CM | POA: Diagnosis not present

## 2018-11-08 DIAGNOSIS — J45909 Unspecified asthma, uncomplicated: Secondary | ICD-10-CM | POA: Diagnosis not present

## 2018-11-08 DIAGNOSIS — I429 Cardiomyopathy, unspecified: Secondary | ICD-10-CM | POA: Diagnosis not present

## 2018-11-08 DIAGNOSIS — E11610 Type 2 diabetes mellitus with diabetic neuropathic arthropathy: Secondary | ICD-10-CM | POA: Diagnosis not present

## 2018-11-08 DIAGNOSIS — M810 Age-related osteoporosis without current pathological fracture: Secondary | ICD-10-CM | POA: Diagnosis not present

## 2018-11-08 DIAGNOSIS — E1122 Type 2 diabetes mellitus with diabetic chronic kidney disease: Secondary | ICD-10-CM | POA: Diagnosis not present

## 2018-11-08 DIAGNOSIS — J42 Unspecified chronic bronchitis: Secondary | ICD-10-CM | POA: Diagnosis not present

## 2018-11-08 DIAGNOSIS — N184 Chronic kidney disease, stage 4 (severe): Secondary | ICD-10-CM | POA: Diagnosis not present

## 2018-11-09 DIAGNOSIS — R69 Illness, unspecified: Secondary | ICD-10-CM | POA: Diagnosis not present

## 2018-11-09 DIAGNOSIS — M810 Age-related osteoporosis without current pathological fracture: Secondary | ICD-10-CM | POA: Diagnosis not present

## 2018-11-09 DIAGNOSIS — I5032 Chronic diastolic (congestive) heart failure: Secondary | ICD-10-CM | POA: Diagnosis not present

## 2018-11-09 DIAGNOSIS — J42 Unspecified chronic bronchitis: Secondary | ICD-10-CM | POA: Diagnosis not present

## 2018-11-09 DIAGNOSIS — I130 Hypertensive heart and chronic kidney disease with heart failure and stage 1 through stage 4 chronic kidney disease, or unspecified chronic kidney disease: Secondary | ICD-10-CM | POA: Diagnosis not present

## 2018-11-09 DIAGNOSIS — J45909 Unspecified asthma, uncomplicated: Secondary | ICD-10-CM | POA: Diagnosis not present

## 2018-11-09 DIAGNOSIS — N184 Chronic kidney disease, stage 4 (severe): Secondary | ICD-10-CM | POA: Diagnosis not present

## 2018-11-09 DIAGNOSIS — I429 Cardiomyopathy, unspecified: Secondary | ICD-10-CM | POA: Diagnosis not present

## 2018-11-09 DIAGNOSIS — E11610 Type 2 diabetes mellitus with diabetic neuropathic arthropathy: Secondary | ICD-10-CM | POA: Diagnosis not present

## 2018-11-09 DIAGNOSIS — E1122 Type 2 diabetes mellitus with diabetic chronic kidney disease: Secondary | ICD-10-CM | POA: Diagnosis not present

## 2018-11-11 ENCOUNTER — Ambulatory Visit: Payer: Medicare HMO | Admitting: Pulmonary Disease

## 2018-11-11 DIAGNOSIS — R69 Illness, unspecified: Secondary | ICD-10-CM | POA: Diagnosis not present

## 2018-11-11 DIAGNOSIS — I429 Cardiomyopathy, unspecified: Secondary | ICD-10-CM | POA: Diagnosis not present

## 2018-11-11 DIAGNOSIS — N184 Chronic kidney disease, stage 4 (severe): Secondary | ICD-10-CM | POA: Diagnosis not present

## 2018-11-11 DIAGNOSIS — J45909 Unspecified asthma, uncomplicated: Secondary | ICD-10-CM | POA: Diagnosis not present

## 2018-11-11 DIAGNOSIS — I130 Hypertensive heart and chronic kidney disease with heart failure and stage 1 through stage 4 chronic kidney disease, or unspecified chronic kidney disease: Secondary | ICD-10-CM | POA: Diagnosis not present

## 2018-11-11 DIAGNOSIS — I5032 Chronic diastolic (congestive) heart failure: Secondary | ICD-10-CM | POA: Diagnosis not present

## 2018-11-11 DIAGNOSIS — E11610 Type 2 diabetes mellitus with diabetic neuropathic arthropathy: Secondary | ICD-10-CM | POA: Diagnosis not present

## 2018-11-11 DIAGNOSIS — E1122 Type 2 diabetes mellitus with diabetic chronic kidney disease: Secondary | ICD-10-CM | POA: Diagnosis not present

## 2018-11-11 DIAGNOSIS — J42 Unspecified chronic bronchitis: Secondary | ICD-10-CM | POA: Diagnosis not present

## 2018-11-11 DIAGNOSIS — M810 Age-related osteoporosis without current pathological fracture: Secondary | ICD-10-CM | POA: Diagnosis not present

## 2018-11-14 DIAGNOSIS — I429 Cardiomyopathy, unspecified: Secondary | ICD-10-CM | POA: Diagnosis not present

## 2018-11-14 DIAGNOSIS — J45909 Unspecified asthma, uncomplicated: Secondary | ICD-10-CM | POA: Diagnosis not present

## 2018-11-14 DIAGNOSIS — E11610 Type 2 diabetes mellitus with diabetic neuropathic arthropathy: Secondary | ICD-10-CM | POA: Diagnosis not present

## 2018-11-14 DIAGNOSIS — I130 Hypertensive heart and chronic kidney disease with heart failure and stage 1 through stage 4 chronic kidney disease, or unspecified chronic kidney disease: Secondary | ICD-10-CM | POA: Diagnosis not present

## 2018-11-14 DIAGNOSIS — E1122 Type 2 diabetes mellitus with diabetic chronic kidney disease: Secondary | ICD-10-CM | POA: Diagnosis not present

## 2018-11-14 DIAGNOSIS — R69 Illness, unspecified: Secondary | ICD-10-CM | POA: Diagnosis not present

## 2018-11-14 DIAGNOSIS — J42 Unspecified chronic bronchitis: Secondary | ICD-10-CM | POA: Diagnosis not present

## 2018-11-14 DIAGNOSIS — M810 Age-related osteoporosis without current pathological fracture: Secondary | ICD-10-CM | POA: Diagnosis not present

## 2018-11-14 DIAGNOSIS — I5032 Chronic diastolic (congestive) heart failure: Secondary | ICD-10-CM | POA: Diagnosis not present

## 2018-11-14 DIAGNOSIS — N184 Chronic kidney disease, stage 4 (severe): Secondary | ICD-10-CM | POA: Diagnosis not present

## 2018-11-18 DIAGNOSIS — I130 Hypertensive heart and chronic kidney disease with heart failure and stage 1 through stage 4 chronic kidney disease, or unspecified chronic kidney disease: Secondary | ICD-10-CM | POA: Diagnosis not present

## 2018-11-18 DIAGNOSIS — I5032 Chronic diastolic (congestive) heart failure: Secondary | ICD-10-CM | POA: Diagnosis not present

## 2018-11-18 DIAGNOSIS — J42 Unspecified chronic bronchitis: Secondary | ICD-10-CM | POA: Diagnosis not present

## 2018-11-18 DIAGNOSIS — E11610 Type 2 diabetes mellitus with diabetic neuropathic arthropathy: Secondary | ICD-10-CM | POA: Diagnosis not present

## 2018-11-18 DIAGNOSIS — N184 Chronic kidney disease, stage 4 (severe): Secondary | ICD-10-CM | POA: Diagnosis not present

## 2018-11-18 DIAGNOSIS — J45909 Unspecified asthma, uncomplicated: Secondary | ICD-10-CM | POA: Diagnosis not present

## 2018-11-18 DIAGNOSIS — E1122 Type 2 diabetes mellitus with diabetic chronic kidney disease: Secondary | ICD-10-CM | POA: Diagnosis not present

## 2018-11-18 DIAGNOSIS — M810 Age-related osteoporosis without current pathological fracture: Secondary | ICD-10-CM | POA: Diagnosis not present

## 2018-11-18 DIAGNOSIS — I429 Cardiomyopathy, unspecified: Secondary | ICD-10-CM | POA: Diagnosis not present

## 2018-11-18 DIAGNOSIS — R69 Illness, unspecified: Secondary | ICD-10-CM | POA: Diagnosis not present

## 2018-11-21 DIAGNOSIS — J42 Unspecified chronic bronchitis: Secondary | ICD-10-CM | POA: Diagnosis not present

## 2018-11-21 DIAGNOSIS — N184 Chronic kidney disease, stage 4 (severe): Secondary | ICD-10-CM | POA: Diagnosis not present

## 2018-11-21 DIAGNOSIS — R69 Illness, unspecified: Secondary | ICD-10-CM | POA: Diagnosis not present

## 2018-11-21 DIAGNOSIS — M810 Age-related osteoporosis without current pathological fracture: Secondary | ICD-10-CM | POA: Diagnosis not present

## 2018-11-21 DIAGNOSIS — I130 Hypertensive heart and chronic kidney disease with heart failure and stage 1 through stage 4 chronic kidney disease, or unspecified chronic kidney disease: Secondary | ICD-10-CM | POA: Diagnosis not present

## 2018-11-21 DIAGNOSIS — J45909 Unspecified asthma, uncomplicated: Secondary | ICD-10-CM | POA: Diagnosis not present

## 2018-11-21 DIAGNOSIS — I5032 Chronic diastolic (congestive) heart failure: Secondary | ICD-10-CM | POA: Diagnosis not present

## 2018-11-21 DIAGNOSIS — E11610 Type 2 diabetes mellitus with diabetic neuropathic arthropathy: Secondary | ICD-10-CM | POA: Diagnosis not present

## 2018-11-21 DIAGNOSIS — I429 Cardiomyopathy, unspecified: Secondary | ICD-10-CM | POA: Diagnosis not present

## 2018-11-21 DIAGNOSIS — E1122 Type 2 diabetes mellitus with diabetic chronic kidney disease: Secondary | ICD-10-CM | POA: Diagnosis not present

## 2018-11-22 DIAGNOSIS — N184 Chronic kidney disease, stage 4 (severe): Secondary | ICD-10-CM | POA: Diagnosis not present

## 2018-11-23 DIAGNOSIS — I130 Hypertensive heart and chronic kidney disease with heart failure and stage 1 through stage 4 chronic kidney disease, or unspecified chronic kidney disease: Secondary | ICD-10-CM | POA: Diagnosis not present

## 2018-11-23 DIAGNOSIS — M810 Age-related osteoporosis without current pathological fracture: Secondary | ICD-10-CM | POA: Diagnosis not present

## 2018-11-23 DIAGNOSIS — I429 Cardiomyopathy, unspecified: Secondary | ICD-10-CM | POA: Diagnosis not present

## 2018-11-23 DIAGNOSIS — I5032 Chronic diastolic (congestive) heart failure: Secondary | ICD-10-CM | POA: Diagnosis not present

## 2018-11-23 DIAGNOSIS — J42 Unspecified chronic bronchitis: Secondary | ICD-10-CM | POA: Diagnosis not present

## 2018-11-23 DIAGNOSIS — N184 Chronic kidney disease, stage 4 (severe): Secondary | ICD-10-CM | POA: Diagnosis not present

## 2018-11-23 DIAGNOSIS — J45909 Unspecified asthma, uncomplicated: Secondary | ICD-10-CM | POA: Diagnosis not present

## 2018-11-23 DIAGNOSIS — E1122 Type 2 diabetes mellitus with diabetic chronic kidney disease: Secondary | ICD-10-CM | POA: Diagnosis not present

## 2018-11-23 DIAGNOSIS — R69 Illness, unspecified: Secondary | ICD-10-CM | POA: Diagnosis not present

## 2018-11-23 DIAGNOSIS — E11610 Type 2 diabetes mellitus with diabetic neuropathic arthropathy: Secondary | ICD-10-CM | POA: Diagnosis not present

## 2018-11-27 DIAGNOSIS — Z794 Long term (current) use of insulin: Secondary | ICD-10-CM | POA: Diagnosis not present

## 2018-11-27 DIAGNOSIS — E109 Type 1 diabetes mellitus without complications: Secondary | ICD-10-CM | POA: Diagnosis not present

## 2018-11-29 DIAGNOSIS — J45909 Unspecified asthma, uncomplicated: Secondary | ICD-10-CM | POA: Diagnosis not present

## 2018-11-29 DIAGNOSIS — E11610 Type 2 diabetes mellitus with diabetic neuropathic arthropathy: Secondary | ICD-10-CM | POA: Diagnosis not present

## 2018-11-29 DIAGNOSIS — R69 Illness, unspecified: Secondary | ICD-10-CM | POA: Diagnosis not present

## 2018-11-29 DIAGNOSIS — M810 Age-related osteoporosis without current pathological fracture: Secondary | ICD-10-CM | POA: Diagnosis not present

## 2018-11-29 DIAGNOSIS — N184 Chronic kidney disease, stage 4 (severe): Secondary | ICD-10-CM | POA: Diagnosis not present

## 2018-11-29 DIAGNOSIS — E1122 Type 2 diabetes mellitus with diabetic chronic kidney disease: Secondary | ICD-10-CM | POA: Diagnosis not present

## 2018-11-29 DIAGNOSIS — I130 Hypertensive heart and chronic kidney disease with heart failure and stage 1 through stage 4 chronic kidney disease, or unspecified chronic kidney disease: Secondary | ICD-10-CM | POA: Diagnosis not present

## 2018-11-29 DIAGNOSIS — J42 Unspecified chronic bronchitis: Secondary | ICD-10-CM | POA: Diagnosis not present

## 2018-11-29 DIAGNOSIS — I5032 Chronic diastolic (congestive) heart failure: Secondary | ICD-10-CM | POA: Diagnosis not present

## 2018-11-29 DIAGNOSIS — I429 Cardiomyopathy, unspecified: Secondary | ICD-10-CM | POA: Diagnosis not present

## 2018-12-02 DIAGNOSIS — J45909 Unspecified asthma, uncomplicated: Secondary | ICD-10-CM | POA: Diagnosis not present

## 2018-12-02 DIAGNOSIS — E11610 Type 2 diabetes mellitus with diabetic neuropathic arthropathy: Secondary | ICD-10-CM | POA: Diagnosis not present

## 2018-12-02 DIAGNOSIS — I5032 Chronic diastolic (congestive) heart failure: Secondary | ICD-10-CM | POA: Diagnosis not present

## 2018-12-02 DIAGNOSIS — I130 Hypertensive heart and chronic kidney disease with heart failure and stage 1 through stage 4 chronic kidney disease, or unspecified chronic kidney disease: Secondary | ICD-10-CM | POA: Diagnosis not present

## 2018-12-02 DIAGNOSIS — J42 Unspecified chronic bronchitis: Secondary | ICD-10-CM | POA: Diagnosis not present

## 2018-12-02 DIAGNOSIS — R69 Illness, unspecified: Secondary | ICD-10-CM | POA: Diagnosis not present

## 2018-12-02 DIAGNOSIS — M810 Age-related osteoporosis without current pathological fracture: Secondary | ICD-10-CM | POA: Diagnosis not present

## 2018-12-02 DIAGNOSIS — N184 Chronic kidney disease, stage 4 (severe): Secondary | ICD-10-CM | POA: Diagnosis not present

## 2018-12-02 DIAGNOSIS — E1122 Type 2 diabetes mellitus with diabetic chronic kidney disease: Secondary | ICD-10-CM | POA: Diagnosis not present

## 2018-12-02 DIAGNOSIS — I429 Cardiomyopathy, unspecified: Secondary | ICD-10-CM | POA: Diagnosis not present

## 2018-12-03 ENCOUNTER — Telehealth: Payer: Self-pay | Admitting: Internal Medicine

## 2018-12-03 NOTE — Telephone Encounter (Signed)
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 Informed patient that taking Lyrica should indeed be safe with a relatively low risk of lengthening her QTc. All questions answered and patient voiced understanding.

## 2018-12-05 DIAGNOSIS — N184 Chronic kidney disease, stage 4 (severe): Secondary | ICD-10-CM | POA: Diagnosis not present

## 2018-12-05 DIAGNOSIS — J42 Unspecified chronic bronchitis: Secondary | ICD-10-CM | POA: Diagnosis not present

## 2018-12-05 DIAGNOSIS — E11610 Type 2 diabetes mellitus with diabetic neuropathic arthropathy: Secondary | ICD-10-CM | POA: Diagnosis not present

## 2018-12-05 DIAGNOSIS — E1122 Type 2 diabetes mellitus with diabetic chronic kidney disease: Secondary | ICD-10-CM | POA: Diagnosis not present

## 2018-12-05 DIAGNOSIS — J45909 Unspecified asthma, uncomplicated: Secondary | ICD-10-CM | POA: Diagnosis not present

## 2018-12-05 DIAGNOSIS — I429 Cardiomyopathy, unspecified: Secondary | ICD-10-CM | POA: Diagnosis not present

## 2018-12-05 DIAGNOSIS — I5032 Chronic diastolic (congestive) heart failure: Secondary | ICD-10-CM | POA: Diagnosis not present

## 2018-12-05 DIAGNOSIS — I130 Hypertensive heart and chronic kidney disease with heart failure and stage 1 through stage 4 chronic kidney disease, or unspecified chronic kidney disease: Secondary | ICD-10-CM | POA: Diagnosis not present

## 2018-12-05 DIAGNOSIS — M810 Age-related osteoporosis without current pathological fracture: Secondary | ICD-10-CM | POA: Diagnosis not present

## 2018-12-05 DIAGNOSIS — R69 Illness, unspecified: Secondary | ICD-10-CM | POA: Diagnosis not present

## 2018-12-07 DIAGNOSIS — N184 Chronic kidney disease, stage 4 (severe): Secondary | ICD-10-CM | POA: Diagnosis not present

## 2018-12-07 DIAGNOSIS — I130 Hypertensive heart and chronic kidney disease with heart failure and stage 1 through stage 4 chronic kidney disease, or unspecified chronic kidney disease: Secondary | ICD-10-CM | POA: Diagnosis not present

## 2018-12-07 DIAGNOSIS — J42 Unspecified chronic bronchitis: Secondary | ICD-10-CM | POA: Diagnosis not present

## 2018-12-07 DIAGNOSIS — E1122 Type 2 diabetes mellitus with diabetic chronic kidney disease: Secondary | ICD-10-CM | POA: Diagnosis not present

## 2018-12-07 DIAGNOSIS — E11610 Type 2 diabetes mellitus with diabetic neuropathic arthropathy: Secondary | ICD-10-CM | POA: Diagnosis not present

## 2018-12-07 DIAGNOSIS — I429 Cardiomyopathy, unspecified: Secondary | ICD-10-CM | POA: Diagnosis not present

## 2018-12-07 DIAGNOSIS — J45909 Unspecified asthma, uncomplicated: Secondary | ICD-10-CM | POA: Diagnosis not present

## 2018-12-07 DIAGNOSIS — R69 Illness, unspecified: Secondary | ICD-10-CM | POA: Diagnosis not present

## 2018-12-07 DIAGNOSIS — M810 Age-related osteoporosis without current pathological fracture: Secondary | ICD-10-CM | POA: Diagnosis not present

## 2018-12-07 DIAGNOSIS — I5032 Chronic diastolic (congestive) heart failure: Secondary | ICD-10-CM | POA: Diagnosis not present

## 2018-12-09 DIAGNOSIS — N184 Chronic kidney disease, stage 4 (severe): Secondary | ICD-10-CM | POA: Diagnosis not present

## 2018-12-09 DIAGNOSIS — I129 Hypertensive chronic kidney disease with stage 1 through stage 4 chronic kidney disease, or unspecified chronic kidney disease: Secondary | ICD-10-CM | POA: Diagnosis not present

## 2018-12-09 DIAGNOSIS — Z1159 Encounter for screening for other viral diseases: Secondary | ICD-10-CM | POA: Diagnosis not present

## 2018-12-09 DIAGNOSIS — E8770 Fluid overload, unspecified: Secondary | ICD-10-CM | POA: Diagnosis not present

## 2018-12-09 DIAGNOSIS — E876 Hypokalemia: Secondary | ICD-10-CM | POA: Diagnosis not present

## 2018-12-12 DIAGNOSIS — I5032 Chronic diastolic (congestive) heart failure: Secondary | ICD-10-CM | POA: Diagnosis not present

## 2018-12-12 DIAGNOSIS — I130 Hypertensive heart and chronic kidney disease with heart failure and stage 1 through stage 4 chronic kidney disease, or unspecified chronic kidney disease: Secondary | ICD-10-CM | POA: Diagnosis not present

## 2018-12-12 DIAGNOSIS — E1122 Type 2 diabetes mellitus with diabetic chronic kidney disease: Secondary | ICD-10-CM | POA: Diagnosis not present

## 2018-12-12 DIAGNOSIS — J42 Unspecified chronic bronchitis: Secondary | ICD-10-CM | POA: Diagnosis not present

## 2018-12-12 DIAGNOSIS — M810 Age-related osteoporosis without current pathological fracture: Secondary | ICD-10-CM | POA: Diagnosis not present

## 2018-12-12 DIAGNOSIS — R69 Illness, unspecified: Secondary | ICD-10-CM | POA: Diagnosis not present

## 2018-12-12 DIAGNOSIS — E11610 Type 2 diabetes mellitus with diabetic neuropathic arthropathy: Secondary | ICD-10-CM | POA: Diagnosis not present

## 2018-12-12 DIAGNOSIS — I429 Cardiomyopathy, unspecified: Secondary | ICD-10-CM | POA: Diagnosis not present

## 2018-12-12 DIAGNOSIS — J45909 Unspecified asthma, uncomplicated: Secondary | ICD-10-CM | POA: Diagnosis not present

## 2018-12-12 DIAGNOSIS — N184 Chronic kidney disease, stage 4 (severe): Secondary | ICD-10-CM | POA: Diagnosis not present

## 2018-12-20 DIAGNOSIS — J42 Unspecified chronic bronchitis: Secondary | ICD-10-CM | POA: Diagnosis not present

## 2018-12-20 DIAGNOSIS — I130 Hypertensive heart and chronic kidney disease with heart failure and stage 1 through stage 4 chronic kidney disease, or unspecified chronic kidney disease: Secondary | ICD-10-CM | POA: Diagnosis not present

## 2018-12-20 DIAGNOSIS — I5032 Chronic diastolic (congestive) heart failure: Secondary | ICD-10-CM | POA: Diagnosis not present

## 2018-12-20 DIAGNOSIS — N184 Chronic kidney disease, stage 4 (severe): Secondary | ICD-10-CM | POA: Diagnosis not present

## 2018-12-20 DIAGNOSIS — E1122 Type 2 diabetes mellitus with diabetic chronic kidney disease: Secondary | ICD-10-CM | POA: Diagnosis not present

## 2018-12-20 DIAGNOSIS — E11610 Type 2 diabetes mellitus with diabetic neuropathic arthropathy: Secondary | ICD-10-CM | POA: Diagnosis not present

## 2018-12-20 DIAGNOSIS — M810 Age-related osteoporosis without current pathological fracture: Secondary | ICD-10-CM | POA: Diagnosis not present

## 2018-12-20 DIAGNOSIS — I429 Cardiomyopathy, unspecified: Secondary | ICD-10-CM | POA: Diagnosis not present

## 2018-12-20 DIAGNOSIS — R69 Illness, unspecified: Secondary | ICD-10-CM | POA: Diagnosis not present

## 2018-12-20 DIAGNOSIS — J45909 Unspecified asthma, uncomplicated: Secondary | ICD-10-CM | POA: Diagnosis not present

## 2018-12-23 DIAGNOSIS — M810 Age-related osteoporosis without current pathological fracture: Secondary | ICD-10-CM | POA: Diagnosis not present

## 2018-12-23 DIAGNOSIS — E1122 Type 2 diabetes mellitus with diabetic chronic kidney disease: Secondary | ICD-10-CM | POA: Diagnosis not present

## 2018-12-23 DIAGNOSIS — I130 Hypertensive heart and chronic kidney disease with heart failure and stage 1 through stage 4 chronic kidney disease, or unspecified chronic kidney disease: Secondary | ICD-10-CM | POA: Diagnosis not present

## 2018-12-23 DIAGNOSIS — I429 Cardiomyopathy, unspecified: Secondary | ICD-10-CM | POA: Diagnosis not present

## 2018-12-23 DIAGNOSIS — J45909 Unspecified asthma, uncomplicated: Secondary | ICD-10-CM | POA: Diagnosis not present

## 2018-12-23 DIAGNOSIS — R69 Illness, unspecified: Secondary | ICD-10-CM | POA: Diagnosis not present

## 2018-12-23 DIAGNOSIS — N952 Postmenopausal atrophic vaginitis: Secondary | ICD-10-CM | POA: Diagnosis not present

## 2018-12-23 DIAGNOSIS — N184 Chronic kidney disease, stage 4 (severe): Secondary | ICD-10-CM | POA: Diagnosis not present

## 2018-12-23 DIAGNOSIS — I5032 Chronic diastolic (congestive) heart failure: Secondary | ICD-10-CM | POA: Diagnosis not present

## 2018-12-23 DIAGNOSIS — E11610 Type 2 diabetes mellitus with diabetic neuropathic arthropathy: Secondary | ICD-10-CM | POA: Diagnosis not present

## 2018-12-23 DIAGNOSIS — J42 Unspecified chronic bronchitis: Secondary | ICD-10-CM | POA: Diagnosis not present

## 2018-12-27 DIAGNOSIS — J42 Unspecified chronic bronchitis: Secondary | ICD-10-CM | POA: Diagnosis not present

## 2018-12-27 DIAGNOSIS — E11610 Type 2 diabetes mellitus with diabetic neuropathic arthropathy: Secondary | ICD-10-CM | POA: Diagnosis not present

## 2018-12-27 DIAGNOSIS — E1122 Type 2 diabetes mellitus with diabetic chronic kidney disease: Secondary | ICD-10-CM | POA: Diagnosis not present

## 2018-12-27 DIAGNOSIS — Z794 Long term (current) use of insulin: Secondary | ICD-10-CM | POA: Diagnosis not present

## 2018-12-27 DIAGNOSIS — R69 Illness, unspecified: Secondary | ICD-10-CM | POA: Diagnosis not present

## 2018-12-27 DIAGNOSIS — I130 Hypertensive heart and chronic kidney disease with heart failure and stage 1 through stage 4 chronic kidney disease, or unspecified chronic kidney disease: Secondary | ICD-10-CM | POA: Diagnosis not present

## 2018-12-27 DIAGNOSIS — M810 Age-related osteoporosis without current pathological fracture: Secondary | ICD-10-CM | POA: Diagnosis not present

## 2018-12-27 DIAGNOSIS — I429 Cardiomyopathy, unspecified: Secondary | ICD-10-CM | POA: Diagnosis not present

## 2018-12-27 DIAGNOSIS — I5032 Chronic diastolic (congestive) heart failure: Secondary | ICD-10-CM | POA: Diagnosis not present

## 2018-12-27 DIAGNOSIS — J45909 Unspecified asthma, uncomplicated: Secondary | ICD-10-CM | POA: Diagnosis not present

## 2018-12-27 DIAGNOSIS — E109 Type 1 diabetes mellitus without complications: Secondary | ICD-10-CM | POA: Diagnosis not present

## 2018-12-27 DIAGNOSIS — N184 Chronic kidney disease, stage 4 (severe): Secondary | ICD-10-CM | POA: Diagnosis not present

## 2018-12-30 DIAGNOSIS — E11610 Type 2 diabetes mellitus with diabetic neuropathic arthropathy: Secondary | ICD-10-CM | POA: Diagnosis not present

## 2018-12-30 DIAGNOSIS — M810 Age-related osteoporosis without current pathological fracture: Secondary | ICD-10-CM | POA: Diagnosis not present

## 2018-12-30 DIAGNOSIS — J42 Unspecified chronic bronchitis: Secondary | ICD-10-CM | POA: Diagnosis not present

## 2018-12-30 DIAGNOSIS — J45909 Unspecified asthma, uncomplicated: Secondary | ICD-10-CM | POA: Diagnosis not present

## 2018-12-30 DIAGNOSIS — E1122 Type 2 diabetes mellitus with diabetic chronic kidney disease: Secondary | ICD-10-CM | POA: Diagnosis not present

## 2018-12-30 DIAGNOSIS — N184 Chronic kidney disease, stage 4 (severe): Secondary | ICD-10-CM | POA: Diagnosis not present

## 2018-12-30 DIAGNOSIS — I130 Hypertensive heart and chronic kidney disease with heart failure and stage 1 through stage 4 chronic kidney disease, or unspecified chronic kidney disease: Secondary | ICD-10-CM | POA: Diagnosis not present

## 2018-12-30 DIAGNOSIS — I5032 Chronic diastolic (congestive) heart failure: Secondary | ICD-10-CM | POA: Diagnosis not present

## 2018-12-30 DIAGNOSIS — R69 Illness, unspecified: Secondary | ICD-10-CM | POA: Diagnosis not present

## 2018-12-30 DIAGNOSIS — I429 Cardiomyopathy, unspecified: Secondary | ICD-10-CM | POA: Diagnosis not present

## 2019-01-01 DIAGNOSIS — J45909 Unspecified asthma, uncomplicated: Secondary | ICD-10-CM | POA: Diagnosis not present

## 2019-01-01 DIAGNOSIS — J42 Unspecified chronic bronchitis: Secondary | ICD-10-CM | POA: Diagnosis not present

## 2019-01-01 DIAGNOSIS — E11610 Type 2 diabetes mellitus with diabetic neuropathic arthropathy: Secondary | ICD-10-CM | POA: Diagnosis not present

## 2019-01-01 DIAGNOSIS — I429 Cardiomyopathy, unspecified: Secondary | ICD-10-CM | POA: Diagnosis not present

## 2019-01-01 DIAGNOSIS — R69 Illness, unspecified: Secondary | ICD-10-CM | POA: Diagnosis not present

## 2019-01-01 DIAGNOSIS — N184 Chronic kidney disease, stage 4 (severe): Secondary | ICD-10-CM | POA: Diagnosis not present

## 2019-01-01 DIAGNOSIS — M810 Age-related osteoporosis without current pathological fracture: Secondary | ICD-10-CM | POA: Diagnosis not present

## 2019-01-01 DIAGNOSIS — I5032 Chronic diastolic (congestive) heart failure: Secondary | ICD-10-CM | POA: Diagnosis not present

## 2019-01-01 DIAGNOSIS — E1122 Type 2 diabetes mellitus with diabetic chronic kidney disease: Secondary | ICD-10-CM | POA: Diagnosis not present

## 2019-01-01 DIAGNOSIS — I130 Hypertensive heart and chronic kidney disease with heart failure and stage 1 through stage 4 chronic kidney disease, or unspecified chronic kidney disease: Secondary | ICD-10-CM | POA: Diagnosis not present

## 2019-01-02 ENCOUNTER — Telehealth: Payer: Self-pay | Admitting: Cardiovascular Disease

## 2019-01-02 ENCOUNTER — Other Ambulatory Visit: Payer: Self-pay

## 2019-01-02 ENCOUNTER — Emergency Department (HOSPITAL_COMMUNITY): Payer: Medicare HMO

## 2019-01-02 ENCOUNTER — Encounter (HOSPITAL_COMMUNITY): Payer: Self-pay | Admitting: Emergency Medicine

## 2019-01-02 ENCOUNTER — Inpatient Hospital Stay (HOSPITAL_COMMUNITY)
Admission: EM | Admit: 2019-01-02 | Discharge: 2019-01-05 | DRG: 291 | Disposition: A | Discharge status: Home / Self Care | Payer: Medicare HMO | Attending: Internal Medicine | Admitting: Internal Medicine

## 2019-01-02 ENCOUNTER — Ambulatory Visit: Payer: Self-pay | Admitting: *Deleted

## 2019-01-02 DIAGNOSIS — M109 Gout, unspecified: Secondary | ICD-10-CM | POA: Diagnosis present

## 2019-01-02 DIAGNOSIS — B372 Candidiasis of skin and nail: Secondary | ICD-10-CM | POA: Diagnosis present

## 2019-01-02 DIAGNOSIS — I5033 Acute on chronic diastolic (congestive) heart failure: Secondary | ICD-10-CM | POA: Diagnosis present

## 2019-01-02 DIAGNOSIS — G629 Polyneuropathy, unspecified: Secondary | ICD-10-CM

## 2019-01-02 DIAGNOSIS — I5023 Acute on chronic systolic (congestive) heart failure: Secondary | ICD-10-CM

## 2019-01-02 DIAGNOSIS — Z79899 Other long term (current) drug therapy: Secondary | ICD-10-CM

## 2019-01-02 DIAGNOSIS — I10 Essential (primary) hypertension: Secondary | ICD-10-CM | POA: Diagnosis present

## 2019-01-02 DIAGNOSIS — R0602 Shortness of breath: Secondary | ICD-10-CM | POA: Diagnosis not present

## 2019-01-02 DIAGNOSIS — Z79890 Hormone replacement therapy (postmenopausal): Secondary | ICD-10-CM

## 2019-01-02 DIAGNOSIS — K219 Gastro-esophageal reflux disease without esophagitis: Secondary | ICD-10-CM | POA: Diagnosis present

## 2019-01-02 DIAGNOSIS — E119 Type 2 diabetes mellitus without complications: Secondary | ICD-10-CM

## 2019-01-02 DIAGNOSIS — Z90710 Acquired absence of both cervix and uterus: Secondary | ICD-10-CM

## 2019-01-02 DIAGNOSIS — E785 Hyperlipidemia, unspecified: Secondary | ICD-10-CM | POA: Diagnosis present

## 2019-01-02 DIAGNOSIS — Z9111 Patient's noncompliance with dietary regimen: Secondary | ICD-10-CM

## 2019-01-02 DIAGNOSIS — I509 Heart failure, unspecified: Secondary | ICD-10-CM

## 2019-01-02 DIAGNOSIS — E876 Hypokalemia: Secondary | ICD-10-CM | POA: Diagnosis present

## 2019-01-02 DIAGNOSIS — I130 Hypertensive heart and chronic kidney disease with heart failure and stage 1 through stage 4 chronic kidney disease, or unspecified chronic kidney disease: Secondary | ICD-10-CM | POA: Diagnosis not present

## 2019-01-02 DIAGNOSIS — E11649 Type 2 diabetes mellitus with hypoglycemia without coma: Secondary | ICD-10-CM | POA: Diagnosis present

## 2019-01-02 DIAGNOSIS — M1A9XX0 Chronic gout, unspecified, without tophus (tophi): Secondary | ICD-10-CM | POA: Diagnosis present

## 2019-01-02 DIAGNOSIS — E11610 Type 2 diabetes mellitus with diabetic neuropathic arthropathy: Secondary | ICD-10-CM | POA: Diagnosis present

## 2019-01-02 DIAGNOSIS — Z66 Do not resuscitate: Secondary | ICD-10-CM | POA: Diagnosis present

## 2019-01-02 DIAGNOSIS — I110 Hypertensive heart disease with heart failure: Secondary | ICD-10-CM | POA: Diagnosis not present

## 2019-01-02 DIAGNOSIS — Z794 Long term (current) use of insulin: Secondary | ICD-10-CM

## 2019-01-02 DIAGNOSIS — E039 Hypothyroidism, unspecified: Secondary | ICD-10-CM | POA: Diagnosis present

## 2019-01-02 DIAGNOSIS — Z6841 Body Mass Index (BMI) 40.0 and over, adult: Secondary | ICD-10-CM

## 2019-01-02 DIAGNOSIS — Z8249 Family history of ischemic heart disease and other diseases of the circulatory system: Secondary | ICD-10-CM

## 2019-01-02 DIAGNOSIS — G4733 Obstructive sleep apnea (adult) (pediatric): Secondary | ICD-10-CM | POA: Diagnosis present

## 2019-01-02 DIAGNOSIS — E1122 Type 2 diabetes mellitus with diabetic chronic kidney disease: Secondary | ICD-10-CM | POA: Diagnosis present

## 2019-01-02 DIAGNOSIS — I4819 Other persistent atrial fibrillation: Secondary | ICD-10-CM | POA: Diagnosis present

## 2019-01-02 DIAGNOSIS — N184 Chronic kidney disease, stage 4 (severe): Secondary | ICD-10-CM | POA: Diagnosis present

## 2019-01-02 DIAGNOSIS — Z7901 Long term (current) use of anticoagulants: Secondary | ICD-10-CM

## 2019-01-02 DIAGNOSIS — I5043 Acute on chronic combined systolic (congestive) and diastolic (congestive) heart failure: Secondary | ICD-10-CM | POA: Diagnosis present

## 2019-01-02 DIAGNOSIS — Z20828 Contact with and (suspected) exposure to other viral communicable diseases: Secondary | ICD-10-CM | POA: Diagnosis not present

## 2019-01-02 DIAGNOSIS — I272 Pulmonary hypertension, unspecified: Secondary | ICD-10-CM | POA: Diagnosis present

## 2019-01-02 DIAGNOSIS — E1142 Type 2 diabetes mellitus with diabetic polyneuropathy: Secondary | ICD-10-CM | POA: Diagnosis present

## 2019-01-02 DIAGNOSIS — Z7951 Long term (current) use of inhaled steroids: Secondary | ICD-10-CM

## 2019-01-02 DIAGNOSIS — F419 Anxiety disorder, unspecified: Secondary | ICD-10-CM | POA: Diagnosis present

## 2019-01-02 LAB — CBC
HCT: 43.5 % (ref 36.0–46.0)
Hemoglobin: 13.9 g/dL (ref 12.0–15.0)
MCH: 29.8 pg (ref 26.0–34.0)
MCHC: 32 g/dL (ref 30.0–36.0)
MCV: 93.1 fL (ref 80.0–100.0)
Platelets: 121 10*3/uL — ABNORMAL LOW (ref 150–400)
RBC: 4.67 MIL/uL (ref 3.87–5.11)
RDW: 19.7 % — ABNORMAL HIGH (ref 11.5–15.5)
WBC: 8 10*3/uL (ref 4.0–10.5)
nRBC: 0 % (ref 0.0–0.2)

## 2019-01-02 LAB — BASIC METABOLIC PANEL
Anion gap: 11 (ref 5–15)
BUN: 50 mg/dL — ABNORMAL HIGH (ref 8–23)
CO2: 30 mmol/L (ref 22–32)
Calcium: 9.2 mg/dL (ref 8.9–10.3)
Chloride: 100 mmol/L (ref 98–111)
Creatinine, Ser: 2.17 mg/dL — ABNORMAL HIGH (ref 0.44–1.00)
GFR calc Af Amer: 26 mL/min — ABNORMAL LOW (ref >60)
GFR calc non Af Amer: 22 mL/min — ABNORMAL LOW (ref >60)
Glucose, Bld: 173 mg/dL — ABNORMAL HIGH (ref 70–99)
Potassium: 4.3 mmol/L (ref 3.5–5.1)
Sodium: 141 mmol/L (ref 135–145)

## 2019-01-02 MED ORDER — SODIUM CHLORIDE 0.9% FLUSH
3.0000 mL | Freq: Once | INTRAVENOUS | Status: AC
  Administered 2019-01-03: 3 mL via INTRAVENOUS

## 2019-01-02 NOTE — Telephone Encounter (Signed)
Pt reports SOB and abdominal swelling. States onset 1 week ago. States she weighed herself 3 weeks ago, 19lb weight gain. Can not lie flat at night. States she has taken "Double my lasix doses; 160mg  last night and double again this morning." States "About the same." Also reports feet, ankle edema. Speech halting during call. Reports SOB at rest. States "Stomach really swollen, I can see the fluid under my skin." Directed to ED.States  Will follow disposition; husband will drive.  Reason for Disposition . [1] MODERATE difficulty breathing (e.g., speaks in phrases, SOB even at rest, pulse 100-120) AND [2] NEW-onset or WORSE than normal  Answer Assessment - Initial Assessment Questions 1. RESPIRATORY STATUS: "Describe your breathing?" (e.g., wheezing, shortness of breath, unable to speak, severe coughing)     SOB even at rest 2. ONSET: "When did this breathing problem begin?"      1 week ago, worsening yesterday 3. PATTERN "Does the difficult breathing come and go, or has it been constant since it started?"     constant 4. SEVERITY: "How bad is your breathing?" (e.g., mild, moderate, severe)    - MILD: No SOB at rest, mild SOB with walking, speaks normally in sentences, can lay down, no retractions, pulse < 100.    - MODERATE: SOB at rest, SOB with minimal exertion and prefers to sit, cannot lie down flat, speaks in phrases, mild retractions, audible wheezing, pulse 100-120.    - SEVERE: Very SOB at rest, speaks in single words, struggling to breathe, sitting hunched forward, retractions, pulse > 120      moderate 5. RECURRENT SYMPTOM: "Have you had difficulty breathing before?" If so, ask: "When was the last time?" and "What happened that time?"      *No Answer* 6. CARDIAC HISTORY: "Do you have any history of heart disease?" (e.g., heart attack, angina, bypass surgery, angioplasty)      *No Answer* 7. LUNG HISTORY: "Do you have any history of lung disease?"  (e.g., pulmonary embolus, asthma,  emphysema)     *No Answer* 8. CAUSE: "What do you think is causing the breathing problem?"      fluid 9. OTHER SYMPTOMS: "Do you have any other symptoms? (e.g., dizziness, runny nose, cough, chest pain, fever)     Abdomen with edema "Big"  Protocols used: BREATHING DIFFICULTY-A-AH

## 2019-01-02 NOTE — Telephone Encounter (Signed)
Received call late on Monday afternoon  Routed to triage to please call patient on Tuesday

## 2019-01-02 NOTE — ED Triage Notes (Signed)
Pt c/o shortness of breath x 2 weeks that has progressively worsened. Pt hx CHF, shortness of breath worse when lying flat, pt reports weight gain and abdominal swelling. Able to speak in complete sentences.

## 2019-01-02 NOTE — Telephone Encounter (Signed)
New message   Pt c/o swelling: STAT is pt has developed SOB within 24 hours  1) How much weight have you gained and in what time span? Patient has gained 20 lbs within a month  2) If swelling, where is the swelling located? Stomach, legs and ankle, face   3) Are you currently taking a fluid pill? Yes   4) Are you currently SOB? Yes   5) Do you have a log of your daily weights (if so, list)? No   6) Have you gained 3 pounds in a day or 5 pounds in a week?yes   7) Have you traveled recently?patient traveled to [facility] about 3 weeks ago

## 2019-01-03 DIAGNOSIS — Z8249 Family history of ischemic heart disease and other diseases of the circulatory system: Secondary | ICD-10-CM | POA: Diagnosis not present

## 2019-01-03 DIAGNOSIS — E1142 Type 2 diabetes mellitus with diabetic polyneuropathy: Secondary | ICD-10-CM

## 2019-01-03 DIAGNOSIS — Z6841 Body Mass Index (BMI) 40.0 and over, adult: Secondary | ICD-10-CM | POA: Diagnosis not present

## 2019-01-03 DIAGNOSIS — B372 Candidiasis of skin and nail: Secondary | ICD-10-CM | POA: Diagnosis present

## 2019-01-03 DIAGNOSIS — E11610 Type 2 diabetes mellitus with diabetic neuropathic arthropathy: Secondary | ICD-10-CM | POA: Diagnosis present

## 2019-01-03 DIAGNOSIS — I10 Essential (primary) hypertension: Secondary | ICD-10-CM | POA: Diagnosis present

## 2019-01-03 DIAGNOSIS — N184 Chronic kidney disease, stage 4 (severe): Secondary | ICD-10-CM | POA: Diagnosis not present

## 2019-01-03 DIAGNOSIS — I5043 Acute on chronic combined systolic (congestive) and diastolic (congestive) heart failure: Secondary | ICD-10-CM | POA: Diagnosis not present

## 2019-01-03 DIAGNOSIS — I5033 Acute on chronic diastolic (congestive) heart failure: Secondary | ICD-10-CM | POA: Diagnosis not present

## 2019-01-03 DIAGNOSIS — I4821 Permanent atrial fibrillation: Secondary | ICD-10-CM

## 2019-01-03 DIAGNOSIS — M1A9XX0 Chronic gout, unspecified, without tophus (tophi): Secondary | ICD-10-CM | POA: Diagnosis present

## 2019-01-03 DIAGNOSIS — Z7951 Long term (current) use of inhaled steroids: Secondary | ICD-10-CM | POA: Diagnosis not present

## 2019-01-03 DIAGNOSIS — I480 Paroxysmal atrial fibrillation: Secondary | ICD-10-CM

## 2019-01-03 DIAGNOSIS — R0602 Shortness of breath: Secondary | ICD-10-CM | POA: Diagnosis present

## 2019-01-03 DIAGNOSIS — I5023 Acute on chronic systolic (congestive) heart failure: Secondary | ICD-10-CM

## 2019-01-03 DIAGNOSIS — E785 Hyperlipidemia, unspecified: Secondary | ICD-10-CM | POA: Diagnosis present

## 2019-01-03 DIAGNOSIS — Z20828 Contact with and (suspected) exposure to other viral communicable diseases: Secondary | ICD-10-CM | POA: Diagnosis not present

## 2019-01-03 DIAGNOSIS — Z79890 Hormone replacement therapy (postmenopausal): Secondary | ICD-10-CM | POA: Diagnosis not present

## 2019-01-03 DIAGNOSIS — I272 Pulmonary hypertension, unspecified: Secondary | ICD-10-CM | POA: Diagnosis present

## 2019-01-03 DIAGNOSIS — G4733 Obstructive sleep apnea (adult) (pediatric): Secondary | ICD-10-CM | POA: Diagnosis present

## 2019-01-03 DIAGNOSIS — I130 Hypertensive heart and chronic kidney disease with heart failure and stage 1 through stage 4 chronic kidney disease, or unspecified chronic kidney disease: Secondary | ICD-10-CM | POA: Diagnosis not present

## 2019-01-03 DIAGNOSIS — Z794 Long term (current) use of insulin: Secondary | ICD-10-CM | POA: Diagnosis not present

## 2019-01-03 DIAGNOSIS — Z66 Do not resuscitate: Secondary | ICD-10-CM | POA: Diagnosis not present

## 2019-01-03 DIAGNOSIS — Z7901 Long term (current) use of anticoagulants: Secondary | ICD-10-CM | POA: Diagnosis not present

## 2019-01-03 DIAGNOSIS — Z79899 Other long term (current) drug therapy: Secondary | ICD-10-CM | POA: Diagnosis not present

## 2019-01-03 DIAGNOSIS — E1122 Type 2 diabetes mellitus with diabetic chronic kidney disease: Secondary | ICD-10-CM | POA: Diagnosis present

## 2019-01-03 DIAGNOSIS — I4819 Other persistent atrial fibrillation: Secondary | ICD-10-CM | POA: Diagnosis not present

## 2019-01-03 DIAGNOSIS — E039 Hypothyroidism, unspecified: Secondary | ICD-10-CM | POA: Diagnosis present

## 2019-01-03 DIAGNOSIS — F419 Anxiety disorder, unspecified: Secondary | ICD-10-CM | POA: Diagnosis present

## 2019-01-03 DIAGNOSIS — M109 Gout, unspecified: Secondary | ICD-10-CM | POA: Diagnosis present

## 2019-01-03 LAB — CBG MONITORING, ED
Glucose-Capillary: 85 mg/dL (ref 70–99)
Glucose-Capillary: 138 mg/dL — ABNORMAL HIGH (ref 70–99)

## 2019-01-03 LAB — GLUCOSE, CAPILLARY
Glucose-Capillary: 154 mg/dL — ABNORMAL HIGH (ref 70–99)
Glucose-Capillary: 160 mg/dL — ABNORMAL HIGH (ref 70–99)

## 2019-01-03 LAB — TROPONIN I (HIGH SENSITIVITY)
Troponin I (High Sensitivity): 30 ng/L — ABNORMAL HIGH (ref <18)
Troponin I (High Sensitivity): 32 ng/L — ABNORMAL HIGH (ref <18)

## 2019-01-03 LAB — HEMOGLOBIN A1C
Hgb A1c MFr Bld: 8.8 % — ABNORMAL HIGH (ref 4.8–5.6)
Mean Plasma Glucose: 205.86 mg/dL

## 2019-01-03 LAB — SARS CORONAVIRUS 2 (TAT 6-24 HRS): SARS Coronavirus 2: NEGATIVE

## 2019-01-03 LAB — BRAIN NATRIURETIC PEPTIDE: B Natriuretic Peptide: 243.1 pg/mL — ABNORMAL HIGH (ref 0.0–100.0)

## 2019-01-03 MED ORDER — PREGABALIN 25 MG PO CAPS
50.0000 mg | ORAL_CAPSULE | Freq: Every day | ORAL | Status: DC
  Administered 2019-01-04 – 2019-01-05 (×2): 50 mg via ORAL
  Filled 2019-01-03 (×2): qty 2

## 2019-01-03 MED ORDER — PREGABALIN 75 MG PO CAPS
150.0000 mg | ORAL_CAPSULE | Freq: Every day | ORAL | Status: DC
  Administered 2019-01-03 – 2019-01-04 (×2): 150 mg via ORAL
  Filled 2019-01-03 (×2): qty 2

## 2019-01-03 MED ORDER — FUROSEMIDE 10 MG/ML IJ SOLN: 80.0000 mg | Freq: Two times a day (BID) | INTRAMUSCULAR | Status: DC

## 2019-01-03 MED ORDER — SODIUM CHLORIDE 0.9 % IV SOLN: 250.0000 mL | INTRAVENOUS | Status: DC | PRN

## 2019-01-03 MED ORDER — FUROSEMIDE 10 MG/ML IJ SOLN
80.0000 mg | Freq: Two times a day (BID) | INTRAMUSCULAR | Status: DC
  Administered 2019-01-04 – 2019-01-05 (×3): 80 mg via INTRAVENOUS
  Filled 2019-01-03 (×3): qty 8

## 2019-01-03 MED ORDER — SODIUM CHLORIDE 0.9% FLUSH: 3.0000 mL | INTRAVENOUS | Status: DC | PRN

## 2019-01-03 MED ORDER — ALPRAZOLAM 0.25 MG PO TABS
0.2500 mg | ORAL_TABLET | Freq: Two times a day (BID) | ORAL | Status: DC | PRN
  Administered 2019-01-03 – 2019-01-05 (×2): 0.25 mg via ORAL
  Filled 2019-01-03 (×2): qty 1

## 2019-01-03 MED ORDER — INSULIN GLARGINE 100 UNIT/ML ~~LOC~~ SOLN
85.0000 [IU] | Freq: Every day | SUBCUTANEOUS | Status: DC
  Administered 2019-01-03 – 2019-01-04 (×2): 85 [IU] via SUBCUTANEOUS
  Filled 2019-01-03 (×3): qty 0.85

## 2019-01-03 MED ORDER — PREGABALIN 75 MG PO CAPS: 150.0000 mg | ORAL_CAPSULE | Freq: Every day | ORAL | Status: DC

## 2019-01-03 MED ORDER — INSULIN DEGLUDEC 200 UNIT/ML ~~LOC~~ SOPN: 100.0000 [IU] | PEN_INJECTOR | SUBCUTANEOUS | Status: DC

## 2019-01-03 MED ORDER — INSULIN ASPART 100 UNIT/ML ~~LOC~~ SOLN
20.0000 [IU] | Freq: Three times a day (TID) | SUBCUTANEOUS | Status: DC
  Administered 2019-01-03 – 2019-01-04 (×3): 20 [IU] via SUBCUTANEOUS

## 2019-01-03 MED ORDER — FUROSEMIDE 10 MG/ML IJ SOLN
80.0000 mg | Freq: Once | INTRAMUSCULAR | Status: AC
  Administered 2019-01-03: 80 mg via INTRAVENOUS
  Filled 2019-01-03: qty 8

## 2019-01-03 MED ORDER — BUDESONIDE 0.25 MG/2ML IN SUSP
0.2500 mg | Freq: Two times a day (BID) | RESPIRATORY_TRACT | Status: DC
  Administered 2019-01-03 – 2019-01-05 (×4): 0.25 mg via RESPIRATORY_TRACT
  Filled 2019-01-03 (×5): qty 2

## 2019-01-03 MED ORDER — BUPRENORPHINE HCL-NALOXONE HCL 2-0.5 MG SL SUBL: 2.0000 | SUBLINGUAL_TABLET | Freq: Once | SUBLINGUAL | Status: DC

## 2019-01-03 MED ORDER — NYSTATIN 100000 UNIT/GM EX CREA
TOPICAL_CREAM | Freq: Two times a day (BID) | CUTANEOUS | Status: DC
  Administered 2019-01-03 – 2019-01-05 (×4): via TOPICAL
  Filled 2019-01-03: qty 15

## 2019-01-03 MED ORDER — RIVAROXABAN 15 MG PO TABS
15.0000 mg | ORAL_TABLET | Freq: Every day | ORAL | Status: DC
  Administered 2019-01-03 – 2019-01-04 (×2): 15 mg via ORAL
  Filled 2019-01-03 (×3): qty 1

## 2019-01-03 MED ORDER — SODIUM CHLORIDE 0.9% FLUSH
3.0000 mL | Freq: Two times a day (BID) | INTRAVENOUS | Status: DC
  Administered 2019-01-03 – 2019-01-05 (×4): 3 mL via INTRAVENOUS

## 2019-01-03 MED ORDER — INSULIN ASPART 100 UNIT/ML ~~LOC~~ SOLN
0.0000 [IU] | Freq: Three times a day (TID) | SUBCUTANEOUS | Status: DC
  Administered 2019-01-03 – 2019-01-04 (×2): 4 [IU] via SUBCUTANEOUS
  Administered 2019-01-04: 11 [IU] via SUBCUTANEOUS

## 2019-01-03 MED ORDER — ONDANSETRON HCL 4 MG/2ML IJ SOLN: 4.0000 mg | Freq: Four times a day (QID) | INTRAMUSCULAR | Status: DC | PRN

## 2019-01-03 MED ORDER — LEVOTHYROXINE SODIUM 50 MCG PO TABS
50.0000 ug | ORAL_TABLET | Freq: Every day | ORAL | Status: DC
  Administered 2019-01-04 – 2019-01-05 (×2): 50 ug via ORAL
  Filled 2019-01-03 (×2): qty 1

## 2019-01-03 MED ORDER — METOPROLOL SUCCINATE ER 50 MG PO TB24
50.0000 mg | ORAL_TABLET | Freq: Two times a day (BID) | ORAL | Status: DC
  Administered 2019-01-03 – 2019-01-05 (×4): 50 mg via ORAL
  Filled 2019-01-03 (×4): qty 1

## 2019-01-03 MED ORDER — ACETAMINOPHEN 325 MG PO TABS
650.0000 mg | ORAL_TABLET | ORAL | Status: DC | PRN
  Administered 2019-01-03 – 2019-01-05 (×5): 650 mg via ORAL
  Filled 2019-01-03 (×5): qty 2

## 2019-01-03 NOTE — ED Notes (Signed)
ED TO INPATIENT HANDOFF REPORT  ED Nurse Name and Phone #: Davene Costain [phone]  S Name/Age/Gender Kristine [name] 73 y.o. female Room/Bed: 041C/041C  Code Status   Code Status: DNR  Home/SNF/Other Home Patient oriented to: self, place, time and situation Is this baseline? Yes   Triage Complete: Triage complete  Chief Complaint SOB abd swelling  Triage Note Pt c/o shortness of breath x 2 weeks that has progressively worsened. Pt hx CHF, shortness of breath worse when lying flat, pt reports weight gain and abdominal swelling. Able to speak in complete sentences.   Allergies Allergies  Allergen Reactions  . Albuterol Other (See Comments)    REACTION: Tachycardia- AFib  . Epinephrine Other (See Comments)    Increases heart rate per patient.   . Penicillins Other (See Comments)    REACTION: flushing \\T \ hot  Did it involve swelling of the face/tongue/throat, SOB, or low BP? No Did it involve sudden or severe rash/hives, skin peeling, or any reaction on the inside of your mouth or nose? No Did you need to seek medical attention at a hospital or doctor's office? No When did it last happen?20 years ago If all above answers are "NO", may proceed with cephalosporin use.  . Torsemide Swelling  . Bee Venom Other (See Comments)    "makes me nervous"  . Ivp Dye [Iodinated Diagnostic Agents] Other (See Comments)    flushing    Level of Care/Admitting Diagnosis ED Disposition    ED Disposition Condition Comment   Admit  Hospital Area: [place] [[id number]]  Level of Care: Telemetry Cardiac [103]  I expect the patient will be discharged within 24 hours: No (not a candidate for 5C-Observation unit)  Covid Evaluation: Asymptomatic Screening Protocol (No Symptoms)  Diagnosis: Acute on chronic systolic congestive heart failure [facility]) [408144]  Admitting Physician: Etta Quill [phone]  Attending Physician: Etta Quill [[id number]]  PT Class (Do Not Modify):  Observation [104]  PT Acc Code (Do Not Modify): Observation [10022]       B Medical/Surgery History Past Medical History:  Diagnosis Date  . Allergy   . Anxiety   . Arthritis   . Asthma   . Atrial fibrillation ([place])   . Cataract   . CHF (congestive heart failure) ([place])   . Chronic bronchitis ([place])   . Chronic kidney disease   . Clotting disorder ([place])   . Depression   . GERD (gastroesophageal reflux disease)   . High cholesterol   . History of hiatal hernia   . HTN (hypertension)   . Mild aortic sclerosis   . Morbid obesity ([place])   . Neuromuscular disorder ([place])   . Osteoporosis   . Persistent atrial fibrillation ([place])   . Scoliosis   . Spinal stenosis   . Thyroid disease   . Type II diabetes mellitus ([place])   . Vitamin D deficiency    Past Surgical History:  Procedure Laterality Date  . BLADDER SUSPENSION  1980s  . CARDIAC CATHETERIZATION  11/16/09   SMOOTH AND NORMAL  . CARDIOVERSION N/A 01/07/2015   Procedure: CARDIOVERSION;  Surgeon: Skeet Latch, MD;  [facility] END[facility]COPY;  Service: Cardiovascular;  Laterality: N/A;  . CARDIOVERSION N/A 03/11/2015   Procedure: CARDIOVERSION;  Surgeon: Skeet Latch, MD;  Location: [place];  Service: Cardiovascular;  Laterality: N/A;  . CARPAL TUNNEL RELEASE Left 1970s  . CATARACT EXTRACTION W/ INTRAOCULAR LENS  IMPLANT, BILATERAL Bilateral ~ 2010  . COLONOSCOPY  08/14/08  . FINGER FRACTURE  SURGERY Right 1970s   "ring finger"  . FRACTURE SURGERY    . LAPAROSCOPIC CHOLECYSTECTOMY  1980s  . TUBAL LIGATION    . VAGINAL HYSTERECTOMY  1980     A IV Location/Drains/Wounds Patient Lines/Drains/Airways Status   Active Line/Drains/Airways    Name:   Placement date:   Placement time:   Site:   Days:   Peripheral IV 01/03/19 Left Forearm   01/03/19    0431    Forearm   less than 1          Intake/Output Last 24 hours  Intake/Output Summary (Last 24 hours) at 01/03/2019 1417 Last data filed at 01/03/2019  1405 Gross per 24 hour  Intake 240 ml  Output 1450 ml  Net -1210 ml    Labs/Imaging Results for orders placed or performed during the hospital encounter of 01/02/19 (from the past 48 hour(s))  Basic metabolic panel     Status: Abnormal   Collection Time: 01/02/19  8:31 PM  Result Value Ref Range   Sodium 141 135 - 145 mmol/L   Potassium 4.3 3.5 - 5.1 mmol/L   Chloride 100 98 - 111 mmol/L   CO2 30 22 - 32 mmol/L   Glucose, Bld 173 (H) 70 - 99 mg/dL   BUN 50 (H) 8 - 23 mg/dL   Creatinine, Ser 2.17 (H) 0.44 - 1.00 mg/dL   Calcium 9.2 8.9 - 10.3 mg/dL   GFR calc non Af Amer 22 (L) >60 mL/min   GFR calc Af Amer 26 (L) >60 mL/min   Anion gap 11 5 - 15    Comment: Performed at [facility], [address] [place], [place] [address]  CBC     Status: Abnormal   Collection Time: 01/02/19  8:31 PM  Result Value Ref Range   WBC 8.0 4.0 - 10.5 K/uL   RBC 4.67 3.87 - 5.11 MIL/uL   Hemoglobin 13.9 12.0 - 15.0 g/dL   HCT 43.5 36.0 - 46.0 %   MCV 93.1 80.0 - 100.0 fL   MCH 29.8 26.0 - 34.0 pg   MCHC 32.0 30.0 - 36.0 g/dL   RDW 19.7 (H) 11.5 - 15.5 %   Platelets 121 (L) 150 - 400 K/uL   nRBC 0.0 0.0 - 0.2 %    Comment: Performed at [facility], [place] [address], [place], [place] [address]  Troponin I (High Sensitivity)     Status: Abnormal   Collection Time: 01/03/19  4:08 AM  Result Value Ref Range   Troponin I (High Sensitivity) 30 (H) <18 ng/L    Comment: (NOTE) Elevated high sensitivity troponin I (hsTnI) values and significant  changes across serial measurements may suggest ACS but many other  chronic and acute conditions are known to elevate hsTnI results.  Refer to the "Links" section for chest pain algorithms and additional  guidance. Performed at [facility], [place] [address] [place], [place] [address]   Brain natriuretic peptide     Status: Abnormal   Collection Time: 01/03/19  4:08 AM  Result Value Ref Range   B Natriuretic Peptide 243.1 (H) 0.0 -  100.0 pg/mL    Comment: Performed at [facility] [address] [place], [place] [address]  SARS CORONAVIRUS 2 (TAT 6-24 HRS) Nasopharyngeal Nasopharyngeal Swab     Status: None   Collection Time: 01/03/19  5:25 AM   Specimen: Nasopharyngeal Swab  Result Value Ref Range   SARS Coronavirus 2 NEGATIVE  NEGATIVE    Comment: (NOTE) SARS-CoV-2 target nucleic acids are NOT DETECTED. The SARS-CoV-2 RNA is generally detectable in upper and lower respiratory specimens during the acute phase of infection. Negative results do not preclude SARS-CoV-2 infection, do not rule out co-infections with other pathogens, and should not be used as the sole basis for treatment or other patient management decisions. Negative results must be combined with clinical observations, patient history, and epidemiological information. The expected result is Negative. Fact Sheet for Patients: SugarRoll.be Fact Sheet for Healthcare Providers: https://www.woods-mathews.com/ This test is not yet approved or cleared by the [place] FDA and  has been authorized for detection and/or diagnosis of SARS-CoV-2 by FDA under an Emergency Use Authorization (EUA). This EUA will remain  in effect (meaning this test can be used) for the duration of the COVID-19 declaration under Section 56 4(b)(1) of the Act, 21 U.S.C. section 360bbb-3(b)(1), unless the authorization is terminated or revoked sooner. Performed at [facility], [place] [address], [place], [place] [address]   Troponin I (High Sensitivity)     Status: Abnormal   Collection Time: 01/03/19  6:06 AM  Result Value Ref Range   Troponin I (High Sensitivity) 32 (H) <18 ng/L    Comment: (NOTE) Elevated high sensitivity troponin I (hsTnI) values and significant  changes across serial measurements may suggest ACS but many other  chronic and acute conditions are known to elevate hsTnI results.  Refer to the "Links"  section for chest pain algorithms and additional  guidance. Performed at [facility], [place] [address], [place], [place] [address]   Hemoglobin A1c     Status: Abnormal   Collection Time: 01/03/19  6:06 AM  Result Value Ref Range   Hgb A1c MFr Bld 8.8 (H) 4.8 - 5.6 %    Comment: (NOTE) Pre diabetes:          5.7%-6.4% Diabetes:              >6.4% Glycemic control for   <7.0% adults with diabetes    Mean Plasma Glucose 205.86 mg/dL    Comment: Performed at [facility] [address] [place], [place] [address]  CBG monitoring, ED     Status: None   Collection Time: 01/03/19  8:01 AM  Result Value Ref Range   Glucose-Capillary 85 70 - 99 mg/dL  CBG monitoring, ED     Status: Abnormal   Collection Time: 01/03/19 12:53 PM  Result Value Ref Range   Glucose-Capillary 138 (H) 70 - 99 mg/dL   Dg Chest 2 View  Result Date: 01/02/2019 CLINICAL DATA:  Progressive shortness of breath for 2 weeks. EXAM: CHEST - 2 VIEW COMPARISON:  06/03/2018 FINDINGS: Chronic cardiomegaly. Pulmonary vascularity is normal and the lungs are clear. Aortic atherosclerosis. No acute bone abnormality. No effusions. IMPRESSION: 1. No active cardiopulmonary disease. 2. Chronic cardiomegaly. 3. Aortic atherosclerosis. Electronically Signed   By: Lorriane Shire M.D.   On: 01/02/2019 20:53    Pending Labs Unresulted Labs (From admission, onward)    Start     Ordered   01/04/19 2778  Basic metabolic panel  Daily,   R     01/03/19 0540          Vitals/Pain Today's Vitals   01/03/19 0930 01/03/19 1030 01/03/19 1328 01/03/19 1328  BP: 100/65 (!) 114/49  138/72  Pulse: 84 80  80  Resp: 15 16  19   Temp:      TempSrc:  SpO2: 92% 92%  94%  Weight:      Height:      PainSc:   0-No pain     Isolation Precautions No active isolations  Medications Medications  sodium chloride flush (NS) 0.9 % injection 3 mL (3 mLs Intravenous Given 01/03/19 0938)  sodium chloride flush (NS) 0.9 % injection  3 mL (has no administration in time range)  0.9 %  sodium chloride infusion (has no administration in time range)  acetaminophen (TYLENOL) tablet 650 mg (has no administration in time range)  ondansetron (ZOFRAN) injection 4 mg (has no administration in time range)  insulin aspart (novoLOG) injection 0-20 Units (0 Units Subcutaneous Not Given 01/03/19 0803)  insulin aspart (novoLOG) injection 20 Units (20 Units Subcutaneous Not Given 01/03/19 0805)  insulin glargine (LANTUS) injection 85 Units (85 Units Subcutaneous Given 01/03/19 0939)  Rivaroxaban (XARELTO) tablet 15 mg (has no administration in time range)  metoprolol succinate (TOPROL-XL) 24 hr tablet 50 mg (50 mg Oral Not Given 01/03/19 0932)  sodium chloride flush (NS) 0.9 % injection 3 mL (3 mLs Intravenous Given 01/03/19 0432)  furosemide (LASIX) injection 80 mg (80 mg Intravenous Given 01/03/19 0554)    Mobility walks with person assist Low fall risk   Focused Assessments Pulmonary Assessment Handoff:  Lung sounds: Bilateral Breath Sounds: Clear L Breath Sounds: Clear R Breath Sounds: Clear O2 Device: Room Air        R Recommendations: See Admitting Provider Note  Report given to:   Additional Notes:

## 2019-01-03 NOTE — ED Notes (Signed)
Lunch tray ordered 

## 2019-01-03 NOTE — Progress Notes (Signed)
PROGRESS NOTE    Kristine [name]  AUQ:[id number] DOB: March 13, 1946 DOA: 01/02/2019 PCP: Prince Solian, MD    Brief Narrative:  73 y.o. female with medical history significant of DM2, HTN, CHF, listed as systolic though EF is low normal at 50-55% as of March.  Has baseline CKD 4.  Reports 20 lb wt gain over past 1 month and now onset of SOB progressively over the past 2 weeks, as well as orthopnea, exertional intolerance, no chest pain.  She admits to not following diet recommendations, and does frequently eat processed and salty foods.  She reports compliance with her diuretic regimen of Lasix 160 mg twice a day and Zaroxolyn 3 times a week.  She reported taking Zaroxolyn before coming in to ED.  BNP 243, slightly up from when she was admitted for CHF back in March of this year. Also with significant peripheral edema and abdominal distention.  Chest xray reviewed and does show some pulmonary vascular congestion on my read.  Creat 2.1 (about baseline).  She was given 80mg  IV lasix in ED, with effective diuresis and improvement in her symptoms.  She was admitted for further IV diuresis given her acute decompensation.   Assessment & Plan:   Principal Problem:   Acute on chronic diastolic (congestive) heart failure (HCC) Active Problems:   Diabetes mellitus, type 2 (HCC)   Paroxysmal atrial fibrillation (HCC)   CKD (chronic kidney disease) stage 4, GFR 15-29 ml/min (HCC)   Anxiety   Peripheral neuropathy   Morbid obesity (HCC)   Obstructive sleep apnea   Gout   Pulmonary hypertension ([place])   Essential hypertension   Acute on chronic mixed systolic/diastolic CHF: likely due to dietary noncompliance (consumes salty, processed foods).   - Cardiology following - will continue Lasix 80 mg IV BID  - hold Zaroxolyn for now - monitor & replete K, maintain > 4 - strict I/O's - daily weights - monitor renal function  Persistent A. Fib: Currently rate controlled.  Follows with Dr.  Cathie Olden. - Continue home Toprol 50 daily - Continue home Xarelto 15 daily -Monitor telemetry  Elevated troponin: Likely demand ischemia versus poor clearance in the setting of CKD stage IV.  Also with pulmonary hypertension.  Doubt ACS, as patient is free of chest pain and without acute EKG changes. - monitor for clinical signs or symptoms of ischemia  - repeat troponin & ECG if active chest pain  Type 2 Diabetes with nephropathy, peripheral neuropathy: A1c 8.8 Home regimen is Tresiba 100 units twice daily and NovoLog 30 units 3 times daily plus sliding scale. - Lantus 85 units daily -NovoLog 20 units 3 times daily with meals - Resistance sliding scale NovoLog -Continue home Lyrica 50 mg a.m. and lunch, 150 at bedtime  Chronic kidney disease, stage IV: Creatinine 2.17 on admission, at baseline. - Monitor renal function daily - Renally dose meds as indicated, avoid nephrotoxic agents.  Candidiasis of groin: Present on admission.  Nystatin cream to affected areas twice daily.  Hypothyroidism: Chronic.  Continue home Synthroid 50 mcg daily.  Obstructive sleep apnea: CPAP ordered  Pulmonary hypertension:  Essential hypertension:  Gout: Chronic, not currently flared.  Morbid obesity: Patient counseled on diet and lifestyle modifications.  Anxiety: Chronic, stable.  Continue home Xanax 0.25 twice daily as needed.   DVT prophylaxis: on Xarelto Code Status:   Code Status: DNR  Family Communication: Husband, at bedside Disposition Plan: Home pending further diuresis, est 2-3 days   Consultants:   Cardiology  Procedures:  None  Antimicrobials:   None   Subjective: Patient seen and examined lying flat in bed.  Husband at bedside.  Reports improvement in her breathing after IV Lasix given earlier.  Was not able to lay flat recently.  Denies recent illnesses, fever or chills.  Denies abdominal pain, N/V/D.  Relatively recent surgery for Charcot foot (left) and progressing  with home PT, requires walker and/or assistance to ambulate.   Objective: Vitals:   01/03/19 1030 01/03/19 1328 01/03/19 1442 01/03/19 1447  BP: (!) 114/49 138/72  138/81  Pulse: 80 80  78  Resp: 16 19  20   Temp:    97.6 F (36.4 C)  TempSrc:    Oral  SpO2: 92% 94%  97%  Weight:   132.7 kg   Height:   5\' 9"  (1.753 m)     Intake/Output Summary (Last 24 hours) at 01/03/2019 1621 Last data filed at 01/03/2019 1405 Gross per 24 hour  Intake 240 ml  Output 1450 ml  Net -1210 ml   Filed Weights   01/03/19 0422 01/03/19 1442  Weight: 134.7 kg 132.7 kg    Examination:  General exam: morbidly obese, awake, alert, no distress  Respiratory system: Decreased breath sounds due to body habitus. Respiratory effort normal. Cardiovascular system: Distant heart sounds, normal S1/S2, irregular rhythm, rate normal.  Significant pedal and pretibial edema. Gastrointestinal system: Abdomen is obese and ?distended with striae present, soft and nontender. Normal bowel sounds heard. Central nervous system: Alert and oriented. No fross focal neurological deficits. Extremities: Symmetric 5 x 5 power. Skin: erythematous at groin, dry, normal temperature Psychiatry: Judgement and insight appear normal. Mood & affect appropriate.     Data Reviewed: I have personally reviewed following labs and imaging studies  CBC: Recent Labs  Lab 01/02/19 2031  WBC 8.0  HGB 13.9  HCT 43.5  MCV 93.1  PLT 413*   Basic Metabolic Panel: Recent Labs  Lab 01/02/19 2031  NA 141  K 4.3  CL 100  CO2 30  GLUCOSE 173*  BUN 50*  CREATININE 2.17*  CALCIUM 9.2   GFR: Estimated Creatinine Clearance: 34.8 mL/min (A) (by C-G formula based on SCr of 2.17 mg/dL (H)). Liver Function Tests: No results for input(s): AST, ALT, ALKPHOS, BILITOT, PROT, ALBUMIN in the last 168 hours. No results for input(s): LIPASE, AMYLASE in the last 168 hours. No results for input(s): AMMONIA in the last 168 hours. Coagulation  Profile: No results for input(s): INR, PROTIME in the last 168 hours. Cardiac Enzymes: No results for input(s): CKTOTAL, CKMB, CKMBINDEX, TROPONINI in the last 168 hours. BNP (last 3 results) No results for input(s): PROBNP in the last 8760 hours. HbA1C: Recent Labs    01/03/19 0606  HGBA1C 8.8*   CBG: Recent Labs  Lab 01/03/19 0801 01/03/19 1253  GLUCAP 85 138*   Lipid Profile: No results for input(s): CHOL, HDL, LDLCALC, TRIG, CHOLHDL, LDLDIRECT in the last 72 hours. Thyroid Function Tests: No results for input(s): TSH, T4TOTAL, FREET4, T3FREE, THYROIDAB in the last 72 hours. Anemia Panel: No results for input(s): VITAMINB12, FOLATE, FERRITIN, TIBC, IRON, RETICCTPCT in the last 72 hours. Sepsis Labs: No results for input(s): PROCALCITON, LATICACIDVEN in the last 168 hours.  Recent Results (from the past 240 hour(s))  SARS CORONAVIRUS 2 (TAT 6-24 HRS) Nasopharyngeal Nasopharyngeal Swab     Status: None   Collection Time: 01/03/19  5:25 AM   Specimen: Nasopharyngeal Swab  Result Value Ref Range Status   SARS Coronavirus 2 NEGATIVE  NEGATIVE Final    Comment: (NOTE) SARS-CoV-2 target nucleic acids are NOT DETECTED. The SARS-CoV-2 RNA is generally detectable in upper and lower respiratory specimens during the acute phase of infection. Negative results do not preclude SARS-CoV-2 infection, do not rule out co-infections with other pathogens, and should not be used as the sole basis for treatment or other patient management decisions. Negative results must be combined with clinical observations, patient history, and epidemiological information. The expected result is Negative. Fact Sheet for Patients: SugarRoll.be Fact Sheet for Healthcare Providers: https://www.woods-mathews.com/ This test is not yet approved or cleared by the [place] FDA and  has been authorized for detection and/or diagnosis of SARS-CoV-2 by FDA under an  Emergency Use Authorization (EUA). This EUA will remain  in effect (meaning this test can be used) for the duration of the COVID-19 declaration under Section 56 4(b)(1) of the Act, 21 U.S.C. section 360bbb-3(b)(1), unless the authorization is terminated or revoked sooner. Performed at [facility], [place] [address] [place], [place] [address]          Radiology Studies: Dg Chest 2 View  Result Date: 01/02/2019 CLINICAL DATA:  Progressive shortness of breath for 2 weeks. EXAM: CHEST - 2 VIEW COMPARISON:  06/03/2018 FINDINGS: Chronic cardiomegaly. Pulmonary vascularity is normal and the lungs are clear. Aortic atherosclerosis. No acute bone abnormality. No effusions. IMPRESSION: 1. No active cardiopulmonary disease. 2. Chronic cardiomegaly. 3. Aortic atherosclerosis. Electronically Signed   By: Lorriane Shire M.D.   On: 01/02/2019 20:53        Scheduled Meds:  budesonide (PULMICORT) nebulizer solution  0.25 mg Nebulization BID   [START ON 01/04/2019] furosemide  80 mg Intravenous BID   insulin aspart  0-20 Units Subcutaneous TID WC   insulin aspart  20 Units Subcutaneous TID WC   insulin glargine  85 Units Subcutaneous Daily   [START ON 01/04/2019] levothyroxine  50 mcg Oral Q0600   metoprolol succinate  50 mg Oral BID   nystatin cream   Topical BID   pregabalin  150 mg Oral QHS   [START ON 01/04/2019] pregabalin  50 mg Oral Daily   [START ON 01/04/2019] pregabalin  50 mg Oral Daily   Rivaroxaban  15 mg Oral Q supper   sodium chloride flush  3 mL Intravenous Q12H   Continuous Infusions:  sodium chloride       LOS: 0 days   Patient requires continued inpatient care for IV diuresis due to decompensated heart failure with preserved ejection fraction.  Time spent: 40-45 min    Ezekiel Slocumb, DO [facility] Pager [phone]  If 7PM-7AM, please contact night-coverage [email or web address] Password [email or web address] 01/03/2019, 4:21 PM

## 2019-01-03 NOTE — ED Notes (Signed)
Lunch tray at bedside. ?

## 2019-01-03 NOTE — Procedures (Signed)
Patient declined use of CPAP, states she does not wear one at home.

## 2019-01-03 NOTE — Progress Notes (Signed)
Pt was received to room 3e27 by AD Sharmon Leyden while this nurse at lunch, introduced to pt and spouse, poc reviewed , pt c/o pain in legs that she takes lyrica for and I paged Dr Arlana Lindau as requested by pt, bil feet dark in color which pt says is charcot foot and she had surgery for in nov. Pt has MASD in left groin area cleansed and floor st ock antifungal powder placed to area which is very red, moist and yellow exudate cleansed off, MD ordered nystatin powder " I feels li ke needles sticking me.

## 2019-01-03 NOTE — ED Provider Notes (Signed)
[facility] EMERGENCY DEPARTMENT Provider Note   CSN: [id number] Arrival date & time: 01/02/19  2011     History   Chief Complaint Chief Complaint  Patient presents with  . Shortness of Breath    HPI Kristine [name] is a 73 y.o. female.     Patient presents to the emergency department for evaluation of shortness of breath.  Patient reports that over the last 2 weeks or so she has been progressively becoming more and more short of breath.  She reports decreased exercise tolerance and now increased shortness of breath when she lies down at night.  There is no associated chest pain.  Patient reports that she has not weighed herself in a while, but when she weighed herself today she noticed a 20 pound weight gain since last month.  Patient has a history of congestive heart failure.  She takes Lasix 160 mg twice a day and Zaroxolyn 3 times a week.     Past Medical History:  Diagnosis Date  . Allergy   . Anxiety   . Arthritis   . Asthma   . Atrial fibrillation ([place])   . Cataract   . CHF (congestive heart failure) ([place])   . Chronic bronchitis ([place])   . Chronic kidney disease   . Clotting disorder ([place])   . Depression   . GERD (gastroesophageal reflux disease)   . High cholesterol   . History of hiatal hernia   . HTN (hypertension)   . Mild aortic sclerosis   . Morbid obesity ([place])   . Neuromuscular disorder ([place])   . Osteoporosis   . Persistent atrial fibrillation ([place])   . Scoliosis   . Spinal stenosis   . Thyroid disease   . Type II diabetes mellitus ([place])   . Vitamin D deficiency     Patient Active Problem List   Diagnosis Date Noted  . CHF exacerbation ([place]) 06/04/2018  . Acute on chronic systolic CHF (congestive heart failure) ([place]) 06/03/2018  . CKD (chronic kidney disease) stage 4, GFR 15-29 ml/min (HCC) 06/03/2018  . Gait abnormality 02/09/2017  . Obstructive sleep apnea 02/09/2017  . Diabetic peripheral neuropathy ([place]) 09/02/2016   . Peripheral neuropathy 07/29/2016  . Spinal stenosis of lumbar region 07/29/2016  . Excessive daytime sleepiness 07/29/2016  . Morbid obesity ([place]) 07/29/2016  . 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 Community acquired pneumonia 05/31/2015  . CAP (community acquired pneumonia) 05/31/2015  . Anemia 05/31/2015  . Diastolic CHF, chronic ([place]) 05/31/2015  . Paroxysmal atrial fibrillation (HCC)   . Obesity 10/15/2013  . Diastolic dysfunction with chronic heart failure ([place]) 06/23/2013  . Atrial fibrillation ([place]) 06/20/2010  . Pneumonia   . Abnormal cardiovascular function study   . High cholesterol   . Scoliosis   . Spinal stenosis   . Anxiety   . Osteoarthritis   . Osteoporosis   . Mild aortic sclerosis   . Degenerative disc disease   . Vitamin D deficiency   . HEMOPTYSIS UNSPECIFIED 05/12/2010  . Diabetes mellitus, type 2 ([place]) 11/19/2009  . Hypertensive cardiovascular disease 11/19/2009  . DYSPNEA ON EXERTION 11/19/2009    Past Surgical History:  Procedure Laterality Date  . BLADDER SUSPENSION  1980s  . CARDIAC CATHETERIZATION  11/16/09   SMOOTH AND NORMAL  . CARDIOVERSION N/A 01/07/2015   Procedure: CARDIOVERSION;  Surgeon: Skeet Latch, MD;  Location: [facility] ENDOSCOPY;  Service: Cardiovascular;  Laterality: N/A;  . CARDIOVERSION N/A 03/11/2015   Procedure: CARDIOVERSION;  Surgeon: Skeet Latch,  MD;  Location: [facility];  Service: Cardiovascular;  Laterality: N/A;  . CARPAL TUNNEL RELEASE Left 1970s  . CATARACT EXTRACTION W/ INTRAOCULAR LENS  IMPLANT, BILATERAL Bilateral ~ 2010  . COLONOSCOPY  08/14/08  . FINGER FRACTURE SURGERY Right 1970s   "ring finger"  . FRACTURE SURGERY    . LAPAROSCOPIC CHOLECYSTECTOMY  1980s  . TUBAL LIGATION    . VAGINAL HYSTERECTOMY  1980     OB History   No obstetric history on file.      Home Medications    Prior to Admission medications   Medication Sig Start Date End Date Taking? Authorizing Provider  ALPRAZolam (XANAX)  0.25 MG tablet Take 0.125-0.25 mg by mouth 2 (two) times daily as needed for anxiety.  03/26/14   [provider]  BD PEN NEEDLE NANO U/F 32G X 4 MM MISC Inject 1 each as directed See admin instructions. Use pen needles with insulin pens daily 12/24/14   [provider]  budesonide (PULMICORT) 0.25 MG/2ML nebulizer solution Take 2 mLs (0.25 mg total) by nebulization 2 (two) times daily. 06/09/18   Oswald Hillock, MD  estradiol (ESTRACE) 0.1 MG/GM vaginal cream Place 1 Applicatorful vaginally as needed (Estrace).  12/18/16   [provider]  furosemide (LASIX) 80 MG tablet Take 80 mg by mouth 3 (three) times daily.    [provider]  insulin aspart (NOVOLOG FLEXPEN) 100 UNIT/ML FlexPen Inject 30 Units into the skin 3 (three) times daily with meals. Plus sliding scale 01/21/18   [provider]  Insulin Degludec (TRESIBA FLEXTOUCH) 200 UNIT/ML SOPN Inject 100 Units into the skin every morning.     [provider]  KLOR-CON M20 20 MEQ tablet Take 60 mEq by mouth 2 (two) times daily.  05/18/18   [provider]  levalbuterol Penne Lash) 0.63 MG/3ML nebulizer solution Take 3 mLs (0.63 mg total) by nebulization every 8 (eight) hours as needed for up to 30 days for wheezing or shortness of breath. 06/09/18   Oswald Hillock, MD  levothyroxine (SYNTHROID, LEVOTHROID) 50 MCG tablet Take 50 mcg by mouth daily.  10/24/15   [provider]  LYRICA 50 MG capsule Take 50-150 mg by mouth See admin instructions. Take 50 mg in the morning 50 gm at lunch ups to 150 at bedtime 06/01/17   [provider]  metolazone (ZAROXOLYN) 2.5 MG tablet Take 1 tablet by mouth 3 (three) times a week. Mon, Wed, Fri 06/22/18   [provider]  metoprolol succinate (TOPROL-XL) 50 MG 24 hr tablet Take 1 tablet (50 mg total) by mouth 2 (two) times daily. Take with or immediately following a meal. 10/20/18   Nahser, Wonda Cheng, MD  ULORIC 80 MG TABS Take 1 tablet by mouth  daily. 04/29/17   [provider]  Vitamin D, Ergocalciferol, (DRISDOL) 50000 UNITS CAPS Take 50,000 Units by mouth every 7 (seven) days. Takes on Thursday    [provider]  XARELTO 15 MG TABS tablet Take 15 mg by mouth daily with supper.  06/27/16   [provider]    Family History Family History  Problem Relation Age of Onset  . Stroke Father   . Hypertension Father   . Atrial fibrillation Father        HAD MURMUR  . Heart failure Mother   . Congestive Heart Failure Mother   . Hypertension Mother   . Hypertension Brother   . Hypertension Sister   . Hypertension Son   .  CAD Sister        EARLY  . CAD Brother        EARLY  . Colon cancer Neg Hx   . Esophageal cancer Neg Hx   . Stomach cancer Neg Hx   . Rectal cancer Neg Hx     Social History Social History   Tobacco Use  . Smoking status: Never Smoker  . Smokeless tobacco: Never Used  Substance Use Topics  . Alcohol use: No  . Drug use: No     Allergies   Albuterol, Epinephrine, Penicillins, Torsemide, Bee venom, and Ivp dye [iodinated diagnostic agents]   Review of Systems Review of Systems  Constitutional: Positive for unexpected weight change.  Respiratory: Positive for shortness of breath.   Cardiovascular: Positive for leg swelling.  All other systems reviewed and are negative.    Physical Exam Updated Vital Signs BP 119/71 (BP Location: Left Arm)   Pulse 75   Temp 97.7 F (36.5 C) (Oral)   Resp 17   Ht 5\' 9"  (1.753 m)   Wt 134.7 kg   SpO2 95%   BMI 43.86 kg/m   Physical Exam Vitals signs and nursing note reviewed.  Constitutional:      General: She is not in acute distress.    Appearance: Normal appearance. She is well-developed.  HENT:     Head: Normocephalic and atraumatic.     Right Ear: Hearing normal.     Left Ear: Hearing normal.     Nose: Nose normal.  Eyes:     Conjunctiva/sclera: Conjunctivae normal.     Pupils: Pupils are equal, round, and reactive  to light.  Neck:     Musculoskeletal: Normal range of motion and neck supple.  Cardiovascular:     Rate and Rhythm: Regular rhythm.     Heart sounds: S1 normal and S2 normal. No murmur. No friction rub. No gallop.   Pulmonary:     Effort: Pulmonary effort is normal. No respiratory distress.     Breath sounds: Normal breath sounds.  Chest:     Chest wall: No tenderness.  Abdominal:     General: Bowel sounds are normal.     Palpations: Abdomen is soft.     Tenderness: There is no abdominal tenderness. There is no guarding or rebound. Negative signs include Murphy's sign and McBurney's sign.     Hernia: No hernia is present.  Musculoskeletal: Normal range of motion.     Right lower leg: Edema present.     Left lower leg: Edema present.  Skin:    General: Skin is warm and dry.     Findings: No rash.  Neurological:     Mental Status: She is alert and oriented to person, place, and time.     GCS: GCS eye subscore is 4. GCS verbal subscore is 5. GCS motor subscore is 6.     Cranial Nerves: No cranial nerve deficit.     Sensory: No sensory deficit.     Coordination: Coordination normal.  Psychiatric:        Speech: Speech normal.        Behavior: Behavior normal.        Thought Content: Thought content normal.      ED Treatments / Results  Labs (all labs ordered are listed, but only abnormal results are displayed) Labs Reviewed  BASIC METABOLIC PANEL - Abnormal; Notable for the following components:      Result Value   Glucose, Bld 173 (*)  BUN 50 (*)    Creatinine, Ser 2.17 (*)    GFR calc non Af Amer 22 (*)    GFR calc Af Amer 26 (*)    All other components within normal limits  CBC - Abnormal; Notable for the following components:   RDW 19.7 (*)    Platelets 121 (*)    All other components within normal limits  BRAIN NATRIURETIC PEPTIDE - Abnormal; Notable for the following components:   B Natriuretic Peptide 243.1 (*)    All other components within normal limits   TROPONIN I (HIGH SENSITIVITY) - Abnormal; Notable for the following components:   Troponin I (High Sensitivity) 30 (*)    All other components within normal limits    EKG EKG Interpretation  Date/Time:  Tuesday January 03 2019 03:47:26 EDT Ventricular Rate:  81 PR Interval:    QRS Duration: 119 QT Interval:  447 QTC Calculation: 503 R Axis:   103 Text Interpretation:  Atrial fibrillation Nonspecific intraventricular conduction delay Low voltage, extremity and precordial leads No significant change since last tracing Confirmed by Orpah Greek [phone]) on 01/03/2019 3:53:41 AM   Radiology Dg Chest 2 View  Result Date: 01/02/2019 CLINICAL DATA:  Progressive shortness of breath for 2 weeks. EXAM: CHEST - 2 VIEW COMPARISON:  06/03/2018 FINDINGS: Chronic cardiomegaly. Pulmonary vascularity is normal and the lungs are clear. Aortic atherosclerosis. No acute bone abnormality. No effusions. IMPRESSION: 1. No active cardiopulmonary disease. 2. Chronic cardiomegaly. 3. Aortic atherosclerosis. Electronically Signed   By: Lorriane Shire M.D.   On: 01/02/2019 20:53    Procedures Procedures (including critical care time)  Medications Ordered in ED Medications  furosemide (LASIX) injection 80 mg (has no administration in time range)  sodium chloride flush (NS) 0.9 % injection 3 mL (3 mLs Intravenous Given 01/03/19 0432)     Initial Impression / Assessment and Plan / ED Course  I have reviewed the triage vital signs and the nursing notes.  Pertinent labs & imaging results that were available during my care of the patient were reviewed by me and considered in my medical decision making (see chart for details).        Patient presents to the emergency department for evaluation of difficulty breathing.  She has a history of CHF.  Patient is on large doses of Lasix and Zaroxolyn at home for maintenance.  Over the last couple of weeks she has had worsening of her breathing.   Medication dosing has not changed.  Patient reports a weight gain of 20 pounds over the last month associated with her shortness of breath.  She has increased abdominal girth and orthopnea.  She has not hypoxic at rest but is visibly dyspneic, having difficulty speaking because of shortness of breath.  Final Clinical Impressions(s) / ED Diagnoses   Final diagnoses:  Acute on chronic systolic congestive heart failure [facility])    ED Discharge Orders    None       Orpah Greek, MD 01/03/19 [phone]

## 2019-01-03 NOTE — Telephone Encounter (Signed)
In reviewing the pt chart.. she is in the ED currently being evaluated for her SOB and edema.

## 2019-01-03 NOTE — Consult Note (Addendum)
Cardiology Consultation:   Patient ID: Kristine [name] MRN: [id number]; DOB: 1946-02-10  Admit date: 01/02/2019 Date of Consult: 01/03/2019  Primary Care Provider: Prince Solian, MD Primary Cardiologist: Mertie Moores, MD  Primary Electrophysiologist:  None    Patient Profile:   Kristine [name] is a 73 y.o. female with a hx of chronic diastolic heart failure, pulmonary hypertension, CKD, persistent fibrillation, hyperlipidemia, hypertension, diabetes who is being seen today for the evaluation of CHF at the request of Dr. Alcario Drought.  History of Present Illness:   Ms. Kristine [name] is a 73 year old female with past medical history noted above.  She is followed by Dr. Acie Fredrickson as an outpatient.  Her last echocardiogram 12/2017 showed an EF of 45 to 50% with diffuse hypokinesis, PA peak pressure 43 mmHg, and trivial pericardial effusion.  She has been maintained on Xarelto and Toprol XL in regards to her A. Fib.  She was last seen through a telehealth visit on 08/16/2018 with Dr. Acie Fredrickson.  She reported a recent hospitalization prior to that visit in March for CHF.  She has been maintained on high-dose Lasix 80mg  TID with potassium supplement and metolazone therapy as well.  She reported eating a fairly high salt diet.  Notes indicated she had not made much of an effort to decrease her processed meat intake.  Encouraged to follow low-sodium diet at this visit.  She is followed through nephrology in regards to her chronic kidney disease. Creatinine baseline around 2. She was continued on her home medications without any changes at this visit.   She reports over the past couple months she has not been eating like she should.  Has been eating hot dogs, sandwiches, ice cream, etc.  She has not been weighing herself on a regular basis.  Think she has had about 20 pound weight gain over the past month. States she has been compliant with her home diuretic.  [place] the [place] and  had noted her clothes were not fitting well. Actually had to size up.  States she did not realize this was likely related to increase fluid.  Reports increased lower extremity edema, abdominal fullness and orthopnea.  Yesterday her breathing became significantly worse she is unable to speak complete sentences without stopping to take breaths.   In the ED her labs showed stable electrolytes, creatinine 2, BNP 243, high-sensitivity troponin 30>>32, hemoglobin 13.9.  EKG showed rate controlled atrial fibrillation.  Chest x-ray showed no edema.  She was given 80 mg IV Lasix in the ED and has had decent urine output.  States she is already seen an improvement in her respiratory status.  Heart Pathway Score:     Past Medical History:  Diagnosis Date   Allergy    Anxiety    Arthritis    Asthma    Atrial fibrillation (HCC)    Cataract    CHF (congestive heart failure) (HCC)    Chronic bronchitis (HCC)    Chronic kidney disease    Clotting disorder (HCC)    Depression    GERD (gastroesophageal reflux disease)    High cholesterol    History of hiatal hernia    HTN (hypertension)    Mild aortic sclerosis    Morbid obesity (HCC)    Neuromuscular disorder (HCC)    Osteoporosis    Persistent atrial fibrillation (HCC)    Scoliosis    Spinal stenosis    Thyroid disease    Type II diabetes mellitus ([place])    Vitamin  D deficiency     Past Surgical History:  Procedure Laterality Date   BLADDER SUSPENSION  1980s   CARDIAC CATHETERIZATION  11/16/09   SMOOTH AND NORMAL   CARDIOVERSION N/A 01/07/2015   Procedure: CARDIOVERSION;  Surgeon: Skeet Latch, MD;  Location: [facility] ENDOSCOPY;  Service: Cardiovascular;  Laterality: N/A;   CARDIOVERSION N/A 03/11/2015   Procedure: CARDIOVERSION;  Surgeon: Skeet Latch, MD;  Location: [place];  Service: Cardiovascular;  Laterality: N/A;   CARPAL TUNNEL RELEASE Left 1970s   CATARACT EXTRACTION W/ INTRAOCULAR LENS   IMPLANT, BILATERAL Bilateral ~ 2010   COLONOSCOPY  08/14/08   FINGER FRACTURE SURGERY Right 1970s   "ring finger"   FRACTURE SURGERY     LAPAROSCOPIC CHOLECYSTECTOMY  1980s   TUBAL LIGATION     VAGINAL HYSTERECTOMY  1980     Home Medications:  Prior to Admission medications   Medication Sig Start Date End Date Taking? Authorizing Provider  ALPRAZolam (XANAX) 0.25 MG tablet Take 0.125-0.25 mg by mouth 2 (two) times daily as needed for anxiety.  03/26/14  Yes [provider]  budesonide (PULMICORT) 0.25 MG/2ML nebulizer solution Take 2 mLs (0.25 mg total) by nebulization 2 (two) times daily. 06/09/18  Yes Oswald Hillock, MD  colchicine 0.6 MG tablet Take 0.5 tablets by mouth 2 (two) times daily as needed. For gout flare ups 09/22/18  Yes [provider]  estradiol (ESTRACE) 0.1 MG/GM vaginal cream Place 1 Applicatorful vaginally as needed (Estrace).  12/18/16  Yes [provider]  furosemide (LASIX) 80 MG tablet Take 80 mg by mouth 3 (three) times daily.   Yes [provider]  insulin aspart (NOVOLOG FLEXPEN) 100 UNIT/ML FlexPen Inject 30 Units into the skin 3 (three) times daily with meals. Plus sliding scale 01/21/18  Yes [provider]  Insulin Degludec (TRESIBA FLEXTOUCH) 200 UNIT/ML SOPN Inject 100 Units into the skin 2 (two) times daily.    Yes [provider]  KLOR-CON M20 20 MEQ tablet Take 60 mEq by mouth 2 (two) times daily.  05/18/18  Yes [provider]  levalbuterol (XOPENEX) 0.63 MG/3ML nebulizer solution Take 3 mLs (0.63 mg total) by nebulization every 8 (eight) hours as needed for up to 30 days for wheezing or shortness of breath. 06/09/18  Yes Oswald Hillock, MD  levothyroxine (SYNTHROID, LEVOTHROID) 50 MCG tablet Take 50 mcg by mouth daily.  10/24/15  Yes [provider]  LYRICA 50 MG capsule Take 50-150 mg by mouth See admin instructions. Take 50 mg in the morning 50 mg at lunch and up to 150mg  at bedtime 06/01/17   Yes [provider]  metolazone (ZAROXOLYN) 2.5 MG tablet Take 1 tablet by mouth 3 (three) times a week. Mon, Wed, Fri 06/22/18  Yes [provider]  metoprolol succinate (TOPROL-XL) 50 MG 24 hr tablet Take 1 tablet (50 mg total) by mouth 2 (two) times daily. Take with or immediately following a meal. 10/20/18  Yes Nahser, Wonda Cheng, MD  ULORIC 80 MG TABS Take 1 tablet by mouth daily. 04/29/17  Yes [provider]  Vitamin D, Ergocalciferol, (DRISDOL) 50000 UNITS CAPS Take 50,000 Units by mouth every 7 (seven) days. Takes on Thursday   Yes [provider]  XARELTO 15 MG TABS tablet Take 15 mg by mouth daily with supper.  06/27/16  Yes [provider]  BD PEN NEEDLE NANO U/F 32G X 4 MM MISC Inject 1 each as directed See admin instructions. Use pen needles with insulin  pens daily 12/24/14   [provider]    Inpatient Medications: Scheduled Meds:  insulin aspart  0-20 Units Subcutaneous TID WC   insulin aspart  20 Units Subcutaneous TID WC   insulin glargine  85 Units Subcutaneous Daily   metoprolol succinate  50 mg Oral BID   Rivaroxaban  15 mg Oral Q supper   sodium chloride flush  3 mL Intravenous Q12H   Continuous Infusions:  sodium chloride     PRN Meds: sodium chloride, acetaminophen, ondansetron (ZOFRAN) IV, sodium chloride flush  Allergies:    Allergies  Allergen Reactions   Albuterol Other (See Comments)    REACTION: Tachycardia- AFib   Epinephrine Other (See Comments)    Increases heart rate per patient.    Penicillins Other (See Comments)    REACTION: flushing \\T \ hot  Did it involve swelling of the face/tongue/throat, SOB, or low BP? No Did it involve sudden or severe rash/hives, skin peeling, or any reaction on the inside of your mouth or nose? No Did you need to seek medical attention at a hospital or doctor's office? No When did it last happen?20 years ago If all above answers are NO, may proceed with  cephalosporin use.   Torsemide Swelling   Bee Venom Other (See Comments)    "makes me nervous"   Ivp Dye [Iodinated Diagnostic Agents] Other (See Comments)    flushing    Social History:   Social History   Socioeconomic History   Marital status: Married    Spouse name: Not on file   Number of children: 4   Years of education: 14   Highest education level: Not on file  Occupational History   Not on file  Social Needs   Financial resource strain: Not on file   Food insecurity    Worry: Not on file    Inability: Not on file   Transportation needs    Medical: Not on file    Non-medical: Not on file  Tobacco Use   Smoking status: Never Smoker   Smokeless tobacco: Never Used  Substance and Sexual Activity   Alcohol use: No   Drug use: No   Sexual activity: Yes  Lifestyle   Physical activity    Days per week: Not on file    Minutes per session: Not on file   Stress: Not on file  Relationships   Social connections    Talks on phone: Not on file    Gets together: Not on file    Attends religious service: Not on file    Active member of club or organization: Not on file    Attends meetings of clubs or organizations: Not on file    Relationship status: Not on file   Intimate partner violence    Fear of current or ex partner: Not on file    Emotionally abused: Not on file    Physically abused: Not on file    Forced sexual activity: Not on file  Other Topics Concern   Not on file  Social History Narrative   Pt lives in [place] with spouse.   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   Right-handed.   1 cup caffeine per day.    Family History:    Family History  Problem Relation Age of Onset   Stroke Father    Hypertension Father    Atrial  fibrillation Father        HAD MURMUR   Heart failure Mother    Congestive Heart Failure Mother     Hypertension Mother    Hypertension Brother    Hypertension Sister    Hypertension Son    CAD Sister        EARLY   CAD Brother        EARLY   Colon cancer Neg Hx    Esophageal cancer Neg Hx    Stomach cancer Neg Hx    Rectal cancer Neg Hx      ROS:  Please see the history of present illness.   All other ROS reviewed and negative.     Physical Exam/Data:   Vitals:   01/03/19 0830 01/03/19 0915 01/03/19 0930 01/03/19 1030  BP: 108/66 (!) 100/54 100/65 (!) 114/49  Pulse: 85 78 84 80  Resp: 15 20 15 16   Temp:      TempSrc:      SpO2: 94% 95% 92% 92%  Weight:      Height:        Intake/Output Summary (Last 24 hours) at 01/03/2019 1325 Last data filed at 01/03/2019 1030 Gross per 24 hour  Intake 240 ml  Output 750 ml  Net -510 ml   Last 3 Weights 01/03/2019 09/14/2018 09/09/2018  Weight (lbs) 297 lb 282 lb 3.2 oz 267 lb  Weight (kg) 134.718 kg 128.005 kg 121.11 kg     Body mass index is 43.86 kg/m.  General: Obese older WF, in no acute distress HEENT: normal Lymph: no adenopathy Neck: + JVD Endocrine:  No thryomegaly Vascular: No carotid bruits Cardiac:  normal S1, S2; Irreg Irreg; 2/6 systolic murmur  Lungs:  Diminished in bases Abd: soft, distended, nontender, no hepatomegaly  Ext: no edema Musculoskeletal:  No deformities, BUE and BLE strength normal and equal Skin: warm and dry  Neuro:  CNs 2-12 intact, no focal abnormalities noted Psych:  Normal affect   EKG:  The EKG was personally reviewed and demonstrates:  Afib rate controlled  Relevant CV Studies:  TTE: 12/2017  Study Conclusions  - Left ventricle: The cavity size was normal. There was mild   concentric hypertrophy. Systolic function was mildly reduced. The   estimated ejection fraction was in the range of 45% to 50%.   Diffuse hypokinesis. - Aortic valve: Transvalvular velocity was within the normal range.   There was no stenosis. There was no regurgitation. - Mitral valve:  Transvalvular velocity was within the normal range.   There was no evidence for stenosis. There was trivial   regurgitation. - Right ventricle: The cavity size was normal. Wall thickness was   normal. Systolic function was normal. - Right atrium: The atrium was moderately dilated. - Atrial septum: No defect or patent foramen ovale was identified   by color flow Doppler. - Tricuspid valve: There was trivial regurgitation. - Pulmonary arteries: Systolic pressure was mildly increased. PA   peak pressure: 43 mm Hg (S). - Pericardium, extracardiac: A trivial pericardial effusion was   identified.  Laboratory Data:  High Sensitivity Troponin:   Recent Labs  Lab 01/03/19 0408 01/03/19 0606  TROPONINIHS 30* 32*     Chemistry Recent Labs  Lab 01/02/19 2031  NA 141  K 4.3  CL 100  CO2 30  GLUCOSE 173*  BUN 50*  CREATININE 2.17*  CALCIUM 9.2  GFRNONAA 22*  GFRAA 26*  ANIONGAP 11    No results for input(s): PROT, ALBUMIN, AST,  ALT, ALKPHOS, BILITOT in the last 168 hours. Hematology Recent Labs  Lab 01/02/19 2031  WBC 8.0  RBC 4.67  HGB 13.9  HCT 43.5  MCV 93.1  MCH 29.8  MCHC 32.0  RDW 19.7*  PLT 121*   BNP Recent Labs  Lab 01/03/19 0408  BNP 243.1*    DDimer No results for input(s): DDIMER in the last 168 hours.   Radiology/Studies:  Dg Chest 2 View  Result Date: 01/02/2019 CLINICAL DATA:  Progressive shortness of breath for 2 weeks. EXAM: CHEST - 2 VIEW COMPARISON:  06/03/2018 FINDINGS: Chronic cardiomegaly. Pulmonary vascularity is normal and the lungs are clear. Aortic atherosclerosis. No acute bone abnormality. No effusions. IMPRESSION: 1. No active cardiopulmonary disease. 2. Chronic cardiomegaly. 3. Aortic atherosclerosis. Electronically Signed   By: Lorriane Shire M.D.   On: 01/02/2019 20:53    Assessment and Plan:   Aya Geisel is a 73 y.o. female with a hx of chronic diastolic heart failure, pulmonary hypertension, CKD, persistent  fibrillation, hyperlipidemia, hypertension, diabetes who is being seen today for the evaluation of CHF at the request of Dr. Alcario Drought  1. Acute on Chronic diastolic HF: Appears this was precipitated by noncompliance with her diet.  She continues to eat processed and salty foods.  States her weight is up about 20 pounds over a month.  She has been compliant with her home diuretic, Lasix 80mg  TID.  BNP 241, but chest x-ray without overt edema.  Given Lasix 80 mg IV with 1.4 L urine output thus far.  Reports significant improvement. --Continue with IV Lasix 80mg  BID --Strict I's and O's, daily weights --Discussed adherence to diet and implications.  2.  Persistent atrial fibrillation: Currently rate controlled on Toprol-XL and Xarelto.  3.  CKD: Cr baseline in the past around 2.  Follow with diuresis.  4.  Hypertension: Stable with current therapy  5.  Diabetes: Hemoglobin A1c 8.8.  On SSI  For questions or updates, please contact [place] Please consult [email or web address] for contact info under   Signed, Reino Bellis, NP  01/03/2019 1:25 PM   I have examined the patient and reviewed assessment and plan and discussed with patient.  Agree with above as stated.  Evidence of volume overload.  SHe feels better with the diuresis she had in the ER.  At home, she needs to weight daily and decrease salt intake.  We spoke about this at length.  She needs 150 of activity/week.   Continue diuresis and checking renal function.   Larae Grooms

## 2019-01-03 NOTE — ED Notes (Signed)
Ordered bfast 

## 2019-01-03 NOTE — ED Notes (Signed)
Breakfast tray at bedside 

## 2019-01-03 NOTE — H&P (Signed)
History and Physical    Kristine [name] MVH:[id number] DOB: [birth year] DOA: 01/02/2019  PCP: Kristine Solian, MD  Patient coming from: Home  I have personally briefly reviewed patient's old medical records in [place]  Chief Complaint: SOB  HPI: Kristine [name] is a 73 y.o. female with medical history significant of DM2, HTN, CHF, listed as systolic though EF is low normal at 50-55% as of March.  Has baseline CKD 4.  Is on very large doses of diuretics at home (Lasix 160 BID + Zaroxolyn 3 times weekly).  Reports 20 lb wt gain over past 1 month and now onset of SOB progressively over the past 2 weeks.  SOB worse when laying down.  Associated exercise intolerance.  No CP.  She takes Lasix 160 mg twice a day and Zaroxolyn 3 times a week.  ED Course: Kristine [name] before coming in to ED last evening.  While in waiting room does report having to urinate multiple times.  BNP 243, actually slightly higher than when she was admitted for CHF back in March of this year.  Significant peripheral edema and abdominal distention though CXR is clear.  Creat 2.1 (about baseline).  Given 80mg  IV lasix in ED.   Review of Systems: As per HPI, otherwise all review of systems negative.  Past Medical History:  Diagnosis Date  . Allergy   . Anxiety   . Arthritis   . Asthma   . Atrial fibrillation ([place])   . Cataract   . CHF (congestive heart failure) ([place])   . Chronic bronchitis ([place])   . Chronic kidney disease   . Clotting disorder ([place])   . Depression   . GERD (gastroesophageal reflux disease)   . High cholesterol   . History of hiatal hernia   . HTN (hypertension)   . Mild aortic sclerosis   . Morbid obesity ([place])   . Neuromuscular disorder ([place])   . Osteoporosis   . Persistent atrial fibrillation ([place])   . Scoliosis   . Spinal stenosis   . Thyroid disease   . Type II diabetes mellitus ([place])   . Vitamin D deficiency     Past Surgical History:  Procedure  Laterality Date  . BLADDER SUSPENSION  1980s  . CARDIAC CATHETERIZATION  11/16/09   SMOOTH AND NORMAL  . CARDIOVERSION N/A 01/07/2015   Procedure: CARDIOVERSION;  Surgeon: Skeet Latch, MD;  Location: [facility];  Service: Cardiovascular;  Laterality: N/A;  . CARDIOVERSION N/A 03/11/2015   Procedure: CARDIOVERSION;  Surgeon: Skeet Latch, MD;  Location: [place];  Service: Cardiovascular;  Laterality: N/A;  . CARPAL TUNNEL RELEASE Left 1970s  . CATARACT EXTRACTION W/ INTRAOCULAR LENS  IMPLANT, BILATERAL Bilateral ~ 2010  . COLONOSCOPY  08/14/08  . FINGER FRACTURE SURGERY Right 1970s   "ring finger"  . FRACTURE SURGERY    . LAPAROSCOPIC CHOLECYSTECTOMY  1980s  . TUBAL LIGATION    . VAGINAL HYSTERECTOMY  1980     reports that she has never smoked. She has never used smokeless tobacco. She reports that she does not drink alcohol or use drugs.  Allergies  Allergen Reactions  . Albuterol Other (See Comments)    REACTION: Tachycardia- AFib  . Epinephrine Other (See Comments)    Increases heart rate per patient.   . Penicillins Other (See Comments)    REACTION: flushing \\T \ hot  Did it involve swelling of the face/tongue/throat, SOB, or low BP? No Did it involve sudden or severe rash/hives, skin peeling, or  any reaction on the inside of your mouth or nose? No Did you need to seek medical attention at a hospital or doctor's office? No When did it last happen?20 years ago If all above answers are "NO", may proceed with cephalosporin use.  . Torsemide Swelling  . Bee Venom Other (See Comments)    "makes me nervous"  . Ivp Dye [Iodinated Diagnostic Agents] Other (See Comments)    flushing    Family History  Problem Relation Age of Onset  . Stroke Father   . Hypertension Father   . Atrial fibrillation Father        HAD MURMUR  . Heart failure Mother   . Congestive Heart Failure Mother   . Hypertension Mother   . Hypertension Brother   . Hypertension Sister    . Hypertension Son   . CAD Sister        EARLY  . CAD Brother        EARLY  . Colon cancer Neg Hx   . Esophageal cancer Neg Hx   . Stomach cancer Neg Hx   . Rectal cancer Neg Hx      Prior to Admission medications   Medication Sig Start Date End Date Taking? Authorizing Provider  ALPRAZolam (XANAX) 0.25 MG tablet Take 0.125-0.25 mg by mouth 2 (two) times daily as needed for anxiety.  03/26/14   [provider]  BD PEN NEEDLE NANO U/F 32G X 4 MM MISC Inject 1 each as directed See admin instructions. Use pen needles with insulin pens daily 12/24/14   [provider]  budesonide (PULMICORT) 0.25 MG/2ML nebulizer solution Take 2 mLs (0.25 mg total) by nebulization 2 (two) times daily. 06/09/18   Oswald Hillock, MD  estradiol (ESTRACE) 0.1 MG/GM vaginal cream Place 1 Applicatorful vaginally as needed (Estrace).  12/18/16   [provider]  furosemide (LASIX) 80 MG tablet Take 80 mg by mouth 3 (three) times daily.    [provider]  insulin aspart (NOVOLOG FLEXPEN) 100 UNIT/ML FlexPen Inject 30 Units into the skin 3 (three) times daily with meals. Plus sliding scale 01/21/18   [provider]  Insulin Degludec (TRESIBA FLEXTOUCH) 200 UNIT/ML SOPN Inject 100 Units into the skin every morning.     [provider]  KLOR-CON M20 20 MEQ tablet Take 60 mEq by mouth 2 (two) times daily.  05/18/18   [provider]  levalbuterol Penne Lash) 0.63 MG/3ML nebulizer solution Take 3 mLs (0.63 mg total) by nebulization every 8 (eight) hours as needed for up to 30 days for wheezing or shortness of breath. 06/09/18   Oswald Hillock, MD  levothyroxine (SYNTHROID, LEVOTHROID) 50 MCG tablet Take 50 mcg by mouth daily.  10/24/15   [provider]  LYRICA 50 MG capsule Take 50-150 mg by mouth See admin instructions. Take 50 mg in the morning 50 gm at lunch ups to 150 at bedtime 06/01/17   [provider]  metolazone (ZAROXOLYN) 2.5 MG tablet Take 1  tablet by mouth 3 (three) times a week. Mon, Wed, Fri 06/22/18   [provider]  metoprolol succinate (TOPROL-XL) 50 MG 24 hr tablet Take 1 tablet (50 mg total) by mouth 2 (two) times daily. Take with or immediately following a meal. 10/20/18   Nahser, Wonda Cheng, MD  ULORIC 80 MG TABS Take 1 tablet by mouth daily. 04/29/17   [provider]  Vitamin D, Ergocalciferol, (DRISDOL) 50000 UNITS CAPS Take 50,000 Units by mouth every 7 (  seven) days. Takes on Thursday    [provider]  XARELTO 15 MG TABS tablet Take 15 mg by mouth daily with supper.  06/27/16   [provider]    Physical Exam: Vitals:   01/03/19 0118 01/03/19 0320 01/03/19 0348 01/03/19 0422  BP: 127/70 (!) 135/99 122/64 119/71  Pulse: 81 72 71 75  Resp: 16 18 15 17   Temp: 98.3 F (36.8 C) 97.7 F (36.5 C) (!) 97.5 F (36.4 C) 97.7 F (36.5 C)  TempSrc: Oral Oral Oral Oral  SpO2: 95% 98% 96% 95%  Weight:    134.7 kg  Height:    5\' 9"  (1.753 m)    Constitutional: NAD, calm, comfortable Eyes: PERRL, lids and conjunctivae normal ENMT: Mucous membranes are moist. Posterior pharynx clear of any exudate or lesions.Normal dentition.  Neck: normal, supple, no masses, no thyromegaly Respiratory: clear to auscultation bilaterally, no wheezing, no crackles. Normal respiratory effort. No accessory muscle use.  Cardiovascular: Regular rate and rhythm, no murmurs / rubs / gallops. BLE edema present. 2+ pedal pulses. No carotid bruits.  Abdomen: no tenderness, no masses palpated. No hepatosplenomegaly. Bowel sounds positive.  Musculoskeletal: no clubbing / cyanosis. No joint deformity upper and lower extremities. Good ROM, no contractures. Normal muscle tone.  Skin: no rashes, lesions, ulcers. No induration Neurologic: CN 2-12 grossly intact. Sensation intact, DTR normal. Strength 5/5 in all 4.  Psychiatric: Normal judgment and insight. Alert and oriented x 3. Normal mood.    Labs on Admission: I have  personally reviewed following labs and imaging studies  CBC: Recent Labs  Lab 01/02/19 2031  WBC 8.0  HGB 13.9  HCT 43.5  MCV 93.1  PLT 371*   Basic Metabolic Panel: Recent Labs  Lab 01/02/19 2031  NA 141  K 4.3  CL 100  CO2 30  GLUCOSE 173*  BUN 50*  CREATININE 2.17*  CALCIUM 9.2   GFR: Estimated Creatinine Clearance: 35.1 mL/min (A) (by C-G formula based on SCr of 2.17 mg/dL (H)). Liver Function Tests: No results for input(s): AST, ALT, ALKPHOS, BILITOT, PROT, ALBUMIN in the last 168 hours. No results for input(s): LIPASE, AMYLASE in the last 168 hours. No results for input(s): AMMONIA in the last 168 hours. Coagulation Profile: No results for input(s): INR, PROTIME in the last 168 hours. Cardiac Enzymes: No results for input(s): CKTOTAL, CKMB, CKMBINDEX, TROPONINI in the last 168 hours. BNP (last 3 results) No results for input(s): PROBNP in the last 8760 hours. HbA1C: No results for input(s): HGBA1C in the last 72 hours. CBG: No results for input(s): GLUCAP in the last 168 hours. Lipid Profile: No results for input(s): CHOL, HDL, LDLCALC, TRIG, CHOLHDL, LDLDIRECT in the last 72 hours. Thyroid Function Tests: No results for input(s): TSH, T4TOTAL, FREET4, T3FREE, THYROIDAB in the last 72 hours. Anemia Panel: No results for input(s): VITAMINB12, FOLATE, FERRITIN, TIBC, IRON, RETICCTPCT in the last 72 hours. Urine analysis:    Component Value Date/Time   COLORURINE YELLOW 06/03/2018 1252   APPEARANCEUR CLEAR 06/03/2018 1252   LABSPEC 1.011 06/03/2018 1252   PHURINE 6.0 06/03/2018 1252   GLUCOSEU NEGATIVE 06/03/2018 1252   HGBUR NEGATIVE 06/03/2018 1252   BILIRUBINUR NEGATIVE 06/03/2018 1252   KETONESUR NEGATIVE 06/03/2018 1252   PROTEINUR NEGATIVE 06/03/2018 1252   UROBILINOGEN 1.0 11/10/2013 2324   NITRITE POSITIVE (A) 06/03/2018 1252   LEUKOCYTESUR TRACE (A) 06/03/2018 1252    Radiological Exams on Admission: Dg Chest 2 View  Result Date:  01/02/2019 CLINICAL DATA:  Progressive shortness of breath for 2 weeks. EXAM: CHEST - 2 VIEW COMPARISON:  06/03/2018 FINDINGS: Chronic cardiomegaly. Pulmonary vascularity is normal and the lungs are clear. Aortic atherosclerosis. No acute bone abnormality. No effusions. IMPRESSION: 1. No active cardiopulmonary disease. 2. Chronic cardiomegaly. 3. Aortic atherosclerosis. Electronically Signed   By: Lorriane Shire M.D.   On: 01/02/2019 20:53    EKG: Independently reviewed.  Assessment/Plan Principal Problem:   Acute on chronic systolic CHF (congestive heart failure) (HCC) Active Problems:   Diabetes mellitus, type 2 (HCC)   Paroxysmal atrial fibrillation (HCC)   CKD (chronic kidney disease) stage 4, GFR 15-29 ml/min (HCC)    1. Acute on chronic systolic CHF - 1. CHF pathway 2. Took zaroxolyn before coming in 3. 80mg  IV lasix given in ED 4. Strict intake and output (though will not include out prior to being seen in ED this evening after zaroxolyn). 5. Given very large diuretic needs at baseline (and CKD stage 4 at baseline) will defer further diuretic orders to cardiology (looks like she follows with Dr. Acie Fredrickson). 6. Message sent to P. Trent for cards eval in AM. 2. PAF- 1. Continue metoprolol 2. Continue Xarelto 3. Tele monitor 3. DM2 - 1. Pt takes 100 units degludec Daily with 30u novolog TID AC + an SSI 2. Will slightly reduce during admission to 86u degludic Daily 3. 20u Novolog TID AC 4. + resistant scale SSI. 4. CKD 4 - 1. Creat currently at baseline 2. Monitor daily BMP with diuresis  DVT prophylaxis: Xarelto Code Status: Full Family Communication: Husband at bedside Disposition Plan: Home after admit Consults called: Message sent to P. Trent for cards eval and diuretic management in AM Admission status: Place in obs   Shaun Zuccaro, [place] [facility]  How to contact the [facility] Attending or Consulting provider [place] or covering provider during after hours [place], for this patient?  1. Check the care team in [facility] and look for a) attending/consulting [facility] provider listed and b) the [facility] team listed 2. Log into [email or web address]  Amion Physician Scheduling and messaging for groups and whole hospitals  On call and physician scheduling software for group practices, residents, hospitalists and other medical providers for call, clinic, rotation and shift schedules. OnCall [employer] is a hospital-wide system for scheduling doctors and paging doctors on call. EasyPlot is for scientific plotting and data analysis.  [email or web address]  and use [place]'s universal password to access. If you do not have the password, please contact the hospital operator.  3. Locate the [facility] provider you are looking for under [facility] and page to a number that you can be directly reached. 4. If you still have difficulty reaching the provider, please page the [facility] (Director on Call) for the Hospitalists listed on amion for assistance.  01/03/2019, 5:50 AM

## 2019-01-03 NOTE — ED Notes (Signed)
Meal tray at bedside.  

## 2019-01-04 DIAGNOSIS — I4819 Other persistent atrial fibrillation: Secondary | ICD-10-CM

## 2019-01-04 DIAGNOSIS — I5033 Acute on chronic diastolic (congestive) heart failure: Secondary | ICD-10-CM

## 2019-01-04 LAB — GLUCOSE, CAPILLARY
Glucose-Capillary: 53 mg/dL — ABNORMAL LOW (ref 70–99)
Glucose-Capillary: 65 mg/dL — ABNORMAL LOW (ref 70–99)
Glucose-Capillary: 115 mg/dL — ABNORMAL HIGH (ref 70–99)
Glucose-Capillary: 262 mg/dL — ABNORMAL HIGH (ref 70–99)
Glucose-Capillary: 155 mg/dL — ABNORMAL HIGH (ref 70–99)
Glucose-Capillary: 85 mg/dL (ref 70–99)

## 2019-01-04 LAB — BASIC METABOLIC PANEL
Anion gap: 13 (ref 5–15)
Anion gap: 15 (ref 5–15)
BUN: 50 mg/dL — ABNORMAL HIGH (ref 8–23)
BUN: 49 mg/dL — ABNORMAL HIGH (ref 8–23)
CO2: 31 mmol/L (ref 22–32)
CO2: 27 mmol/L (ref 22–32)
Calcium: 9.4 mg/dL (ref 8.9–10.3)
Calcium: 9.1 mg/dL (ref 8.9–10.3)
Chloride: 98 mmol/L (ref 98–111)
Chloride: 96 mmol/L — ABNORMAL LOW (ref 98–111)
Creatinine, Ser: 1.88 mg/dL — ABNORMAL HIGH (ref 0.44–1.00)
Creatinine, Ser: 1.97 mg/dL — ABNORMAL HIGH (ref 0.44–1.00)
GFR calc Af Amer: 31 mL/min — ABNORMAL LOW (ref >60)
GFR calc Af Amer: 29 mL/min — ABNORMAL LOW (ref >60)
GFR calc non Af Amer: 26 mL/min — ABNORMAL LOW (ref >60)
GFR calc non Af Amer: 25 mL/min — ABNORMAL LOW (ref >60)
Glucose, Bld: 116 mg/dL — ABNORMAL HIGH (ref 70–99)
Glucose, Bld: 277 mg/dL — ABNORMAL HIGH (ref 70–99)
Potassium: 2.6 mmol/L — CL (ref 3.5–5.1)
Potassium: 3.2 mmol/L — ABNORMAL LOW (ref 3.5–5.1)
Sodium: 142 mmol/L (ref 135–145)
Sodium: 138 mmol/L (ref 135–145)

## 2019-01-04 LAB — MAGNESIUM: Magnesium: 2.4 mg/dL (ref 1.7–2.4)

## 2019-01-04 MED ORDER — POTASSIUM CHLORIDE 10 MEQ/100ML IV SOLN
10.0000 meq | INTRAVENOUS | Status: AC
  Administered 2019-01-04 (×2): 10 meq via INTRAVENOUS
  Filled 2019-01-04 (×2): qty 100

## 2019-01-04 MED ORDER — DOCUSATE SODIUM 100 MG PO CAPS
100.0000 mg | ORAL_CAPSULE | Freq: Every day | ORAL | Status: DC | PRN
  Administered 2019-01-04: 100 mg via ORAL
  Filled 2019-01-04: qty 1

## 2019-01-04 MED ORDER — POTASSIUM CHLORIDE CRYS ER 20 MEQ PO TBCR: 40.0000 meq | EXTENDED_RELEASE_TABLET | Freq: Once | ORAL | Status: DC

## 2019-01-04 MED ORDER — POTASSIUM CHLORIDE CRYS ER 20 MEQ PO TBCR
40.0000 meq | EXTENDED_RELEASE_TABLET | Freq: Once | ORAL | Status: AC
  Administered 2019-01-04: 40 meq via ORAL
  Filled 2019-01-04: qty 2

## 2019-01-04 MED ORDER — POTASSIUM CHLORIDE CRYS ER 20 MEQ PO TBCR
20.0000 meq | EXTENDED_RELEASE_TABLET | Freq: Once | ORAL | Status: AC
  Administered 2019-01-04: 20 meq via ORAL
  Filled 2019-01-04: qty 1

## 2019-01-04 NOTE — Progress Notes (Signed)
Patient refuses CPAP. Patient states she has not have a test for the CPAP.

## 2019-01-04 NOTE — Progress Notes (Addendum)
Inpatient Diabetes Program Recommendations  AACE/ADA: New Consensus Statement on Inpatient Glycemic Control (2015)  Target Ranges:  Prepandial:   less than 140 mg/dL      Peak postprandial:   less than 180 mg/dL (1-2 hours)      Critically ill patients:  140 - 180 mg/dL   Lab Results  Component Value Date   GLUCAP 115 (H) 01/04/2019   HGBA1C 8.8 (H) 01/03/2019    Review of Glycemic Control  Diabetes history: DM 2 Outpatient Diabetes medications: Tresiba 100 units bid, Novolog 30 units tid Current orders for Inpatient glycemic control:  Lantus 85 units Daily Novolog 0-20 units tid Novolog 20 units tid meal coverage  A1c 8.8% on 10/13  Inpatient Diabetes Program Recommendations:    Hypoglycemia this am 53 mg/dl after Lantus 85 units given last night.   -   Consider decreasing Lantus to 80 units  Will follow.  Thanks,  Tama Headings RN, MSN, BC-ADM Inpatient Diabetes Coordinator Team Pager [phone] (8a-5p)

## 2019-01-04 NOTE — Progress Notes (Addendum)
Progress Note  Patient Name: Kristine [name] Date of Encounter: 01/04/2019  Primary Cardiologist: Mertie Moores, MD  Subjective   Feeling well this morning. Breathing continues to improve.   Inpatient Medications    Scheduled Meds: . budesonide (PULMICORT) nebulizer solution  0.25 mg Nebulization BID  . furosemide  80 mg Intravenous BID  . insulin aspart  0-20 Units Subcutaneous TID WC  . insulin aspart  20 Units Subcutaneous TID WC  . insulin glargine  85 Units Subcutaneous Daily  . levothyroxine  50 mcg Oral Q0600  . metoprolol succinate  50 mg Oral BID  . nystatin cream   Topical BID  . pregabalin  150 mg Oral QHS  . pregabalin  50 mg Oral Daily  . pregabalin  50 mg Oral Daily  . Rivaroxaban  15 mg Oral Q supper  . sodium chloride flush  3 mL Intravenous Q12H   Continuous Infusions: . sodium chloride    . potassium chloride     PRN Meds: sodium chloride, acetaminophen, ALPRAZolam, ondansetron (ZOFRAN) IV, sodium chloride flush   Vital Signs    Vitals:   01/04/19 0452 01/04/19 0742 01/04/19 0834 01/04/19 0854  BP:   (!) 101/35 131/85  Pulse:  87 87   Resp:  20    Temp:      TempSrc:      SpO2:  90% 96%   Weight: 131.7 kg     Height:        Intake/Output Summary (Last 24 hours) at 01/04/2019 0857 Last data filed at 01/04/2019 0840 Gross per 24 hour  Intake 1560 ml  Output 3700 ml  Net -2140 ml   Last 3 Weights 01/04/2019 01/03/2019 01/03/2019  Weight (lbs) 290 lb 6.4 oz 292 lb 9.6 oz 297 lb  Weight (kg) 131.725 kg 132.722 kg 134.718 kg      Telemetry    Afib rate controlled - Personally Reviewed  ECG    No new tracing.  Physical Exam  Pleasant older WF, sitting up in bed.  GEN: No acute distress.   Neck: + JVD Cardiac: Irreg Irreg, 2/6 systolic murmur, no rubs, or gallops.  Respiratory: Clear to auscultation bilaterally. GI: Soft, nontender, non-distended  MS: 1+  Bilateral LE edema Neuro:  Nonfocal  Psych: Normal affect   Labs     High Sensitivity Troponin:   Recent Labs  Lab 01/03/19 0408 01/03/19 0606  TROPONINIHS 30* 32*      Chemistry Recent Labs  Lab 01/02/19 2031 01/04/19 0552  NA 141 142  K 4.3 2.6*  CL 100 98  CO2 30 31  GLUCOSE 173* 116*  BUN 50* 50*  CREATININE 2.17* 1.88*  CALCIUM 9.2 9.4  GFRNONAA 22* 26*  GFRAA 26* 31*  ANIONGAP 11 13     Hematology Recent Labs  Lab 01/02/19 2031  WBC 8.0  RBC 4.67  HGB 13.9  HCT 43.5  MCV 93.1  MCH 29.8  MCHC 32.0  RDW 19.7*  PLT 121*    BNP Recent Labs  Lab 01/03/19 0408  BNP 243.1*     DDimer No results for input(s): DDIMER in the last 168 hours.   Radiology    Dg Chest 2 View  Result Date: 01/02/2019 CLINICAL DATA:  Progressive shortness of breath for 2 weeks. EXAM: CHEST - 2 VIEW COMPARISON:  06/03/2018 FINDINGS: Chronic cardiomegaly. Pulmonary vascularity is normal and the lungs are clear. Aortic atherosclerosis. No acute bone abnormality. No effusions. IMPRESSION: 1. No active cardiopulmonary disease. 2.  Chronic cardiomegaly. 3. Aortic atherosclerosis. Electronically Signed   By: Lorriane Shire M.D.   On: 01/02/2019 20:53    Cardiac Studies   N/a   Patient Profile     73 y.o. female  with a hx of chronic diastolic heart failure, pulmonary hypertension, CKD, persistent fibrillation, hyperlipidemia, hypertension, diabetes who is being seen today for the evaluation of CHF at the request of Dr. Alcario Drought.  Assessment & Plan    1. Acute on Chronic diastolic HF: This was precipitated by noncompliance with her diet.  She continues to eat processed and salty foods.  States her weight was up about 20 pounds over a month.  She has been compliant with her home diuretic, Lasix 80mg  TID.  BNP 241, but chest x-ray without overt edema. Net neg 3L, weight down 292.6>>290.4lbs.  --Continue with IV Lasix 80mg  BID --Strict I's and O's, daily weights --Discussed adherence to diet and implications.  2.  Persistent atrial fibrillation:  Currently rate controlled on Toprol-XL and Xarelto.  3.  CKD: Cr baseline in the past around 2. Stable at 1.8 today. Follow with diuresis.  4.  Hypertension: Soft at times but stable with current therapy  5.  Diabetes: Hemoglobin A1c 8.8.  On SSI  6. Hypokalemia: K+ 2.6. Repleted per primary.  For questions or updates, please contact [place] Please consult [email or web address] for contact info under    Signed, Reino Bellis, NP  01/04/2019, 8:57 AM     I have examined the patient and reviewed assessment and plan and discussed with patient.  Agree with above as stated.  Diuresing well.  ONce she is back to her baseline breathing status, she will be ready for discharge.  Continue IV diuretics for now.   AFib: rate controlled.   Longterm, needs weight loss and improvement in her diet.  Larae Grooms

## 2019-01-04 NOTE — Progress Notes (Signed)
PROGRESS NOTE    Kristine [name]  QQI:[id number] DOB: 03/26/1945 DOA: 01/02/2019 PCP: Prince Solian, MD    Brief Narrative:  73 y.o. female with medical history significant of DM2, HTN, CHF, listed as systolic though EF is low normal at 50-55% as of March.  Has baseline CKD 4.  Reports 20 lb wt gain over past 1 month and now onset of SOB progressively over the past 2 weeks, as well as orthopnea, exertional intolerance, no chest pain.  She admits to not following diet recommendations, and does frequently eat processed and salty foods.  She reports compliance with her diuretic regimen of Lasix 160 mg twice a day and Zaroxolyn 3 times a week.  She reported taking Zaroxolyn before coming in to ED.  BNP 243, slightly up from when she was admitted for CHF back in March of this year. Also with significant peripheral edema and abdominal distention.  Chest xray reviewed and does show some pulmonary vascular congestion on my read.  Creat 2.1 (about baseline).  She was given 80mg  IV lasix in ED, with effective diuresis and improvement in her symptoms.  She was admitted for further IV diuresis given her acute decompensation.   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   - Cardiology following - will continue Lasix 80 mg IV BID  - hold Zaroxolyn for now - monitor & replete K, maintain > 4 - strict I/O's - daily weights - monitor renal function -Discussed dietary compliance   Persistent A. Fib: Currently rate controlled.  Follows with Dr. Cathie Olden. - Continue home Toprol 50 daily - Continue home Xarelto 15 daily -Monitor telemetry  Hypokalemia 2.6 this a.m. in setting of Lasix.  Given 47 M EQ this a.m. 2.6-> 3.2 -Additional 40 M EQ potassium  Elevated troponin: Likely demand ischemia versus poor clearance in the setting of CKD stage IV.  Also with pulmonary hypertension.  Doubt ACS, as patient is free of chest pain and without acute EKG changes. - monitor for clinical signs or symptoms of ischemia  - repeat troponin & ECG if active chest pain  Type 2 Diabetes with nephropathy, peripheral neuropathy: A1c 8.8 Home regimen is Tresiba 100 units twice daily and NovoLog 30 units 3 times daily plus sliding scale. - Lantus 85 units daily -NovoLog 20 units 3 times daily with meals - Resistance sliding scale NovoLog -Continue home Lyrica 50 mg a.m. and lunch, 150 at bedtime  Chronic kidney disease, stage IV: Creatinine 2.17 on admission, at baseline. - Monitor renal function daily - Renally dose meds as indicated, avoid nephrotoxic agents.  Candidiasis of groin: Present on admission.  Nystatin cream to affected areas twice daily.  Hypothyroidism: Chronic.  Continue home Synthroid 50 mcg daily.  Obstructive sleep apnea: CPAP ordered  Pulmonary hypertension:  Essential hypertension:  Gout: Chronic, not currently flared.  Morbid obesity: Patient counseled on diet and lifestyle modifications.  Anxiety: Chronic, stable.  Continue home Xanax 0.25 twice daily as needed.   DVT prophylaxis: on Xarelto  Code Status:   Code Status: DNR  Disposition Plan: Home pending further diuresis, est 1-2 days   Consultants:   Cardiology  Procedures:   None  Antimicrobials:   None   Subjective: Patient seen and examined at bedside no acute distress and resting comfortably. Tolerating diet.  Critical labs this a.m. potassium 2.6.  Patient was given potassium  repletion.  Admits to having corndogs and pizza at home.  She enjoys cooking for a family of 17 frequently.  Denies any chest pain, shortness of breath, fever, nausea, vomiting, urinary complaints.  Admits to having bowel movement.  Otherwise ROS negative   Objective: Vitals:   01/04/19 0742 01/04/19 0834 01/04/19 0854 01/04/19 1201  BP:  (!) 101/35 131/85 112/67  Pulse: 87 87  69  Resp: 20   19  Temp:    97.6 F (36.4 C)  TempSrc:    Oral  SpO2: 90% 96%  95%  Weight:      Height:        Intake/Output Summary (Last 24 hours) at 01/04/2019 1940 Last data filed at 01/04/2019 1404 Gross per 24 hour  Intake 1077.06 ml  Output 3000 ml  Net -1922.94 ml   Filed Weights   01/03/19 0422 01/03/19 1442 01/04/19 0452  Weight: 134.7 kg 132.7 kg 131.7 kg    Examination:  Physical Exam Vitals signs and nursing note reviewed.  Constitutional:      Appearance: She is obese.  HENT:     Head: Normocephalic.  Eyes:     Pupils: Pupils are equal, round, and reactive to light.  Cardiovascular:     Rate and Rhythm: Normal rate.  Pulmonary:     Effort: Pulmonary effort is normal.  Musculoskeletal: Normal range of motion.     Right lower leg: Edema present.     Left lower leg: Edema present.  Neurological:     General: No focal deficit present.     Mental Status: She is alert.  Psychiatric:        Mood and Affect: Mood normal.        Behavior: Behavior normal.        Data Reviewed: I have personally reviewed following labs and imaging studies  CBC: Recent Labs  Lab 01/02/19 2031  WBC 8.0  HGB 13.9  HCT 43.5  MCV 93.1  PLT 950*   Basic Metabolic Panel: Recent Labs  Lab 01/02/19 2031 01/04/19 0552 01/04/19 1307  NA 141 142 138  K 4.3 2.6* 3.2*  CL 100 98 96*  CO2 30 31 27   GLUCOSE 173* 116* 277*  BUN 50* 50* 49*  CREATININE 2.17* 1.88* 1.97*  CALCIUM 9.2 9.4 9.1  MG  --  2.4  --    GFR: Estimated Creatinine Clearance: 38.2 mL/min (A) (by C-G formula based  on SCr of 1.97 mg/dL (H)). Liver Function Tests: No results for input(s): AST, ALT, ALKPHOS, BILITOT, PROT, ALBUMIN in the last 168 hours. No results for input(s): LIPASE, AMYLASE in the last 168 hours. No results for input(s): AMMONIA in the last 168 hours. Coagulation Profile: No results for input(s): INR, PROTIME in the last 168 hours. Cardiac Enzymes: No results for input(s): CKTOTAL, CKMB, CKMBINDEX, TROPONINI in the last 168 hours. BNP (last 3 results) No results for input(s): PROBNP in the last 8760 hours. HbA1C: Recent Labs    01/03/19 0606  HGBA1C 8.8*   CBG: Recent Labs  Lab 01/04/19 0412 01/04/19 0435 01/04/19 0530 01/04/19 1157 01/04/19 1818  GLUCAP 53* 65*  115* 262* 155*   Lipid Profile: No results for input(s): CHOL, HDL, LDLCALC, TRIG, CHOLHDL, LDLDIRECT in the last 72 hours. Thyroid Function Tests: No results for input(s): TSH, T4TOTAL, FREET4, T3FREE, THYROIDAB in the last 72 hours. Anemia Panel: No results for input(s): VITAMINB12, FOLATE, FERRITIN, TIBC, IRON, RETICCTPCT in the last 72 hours. Sepsis Labs: No results for input(s): PROCALCITON, LATICACIDVEN in the last 168 hours.  Recent Results (from the past 240 hour(s))  SARS CORONAVIRUS 2 (TAT 6-24 HRS) Nasopharyngeal Nasopharyngeal Swab     Status: None   Collection Time: 01/03/19  5:25 AM   Specimen: Nasopharyngeal Swab  Result Value Ref Range Status   SARS Coronavirus 2 NEGATIVE NEGATIVE Final    Comment: (NOTE) SARS-CoV-2 target nucleic acids are NOT DETECTED. The SARS-CoV-2 RNA is generally detectable in upper and lower respiratory specimens during the acute phase of infection. Negative results do not preclude SARS-CoV-2 infection, do not rule out co-infections with other pathogens, and should not be used as the sole basis for treatment or other patient management decisions. Negative results must be combined with clinical observations, patient history, and epidemiological information. The  expected result is Negative. Fact Sheet for Patients: SugarRoll.be Fact Sheet for Healthcare Providers: https://www.woods-mathews.com/ This test is not yet approved or cleared by the [place] FDA and  has been authorized for detection and/or diagnosis of SARS-CoV-2 by FDA under an Emergency Use Authorization (EUA). This EUA will remain  in effect (meaning this test can be used) for the duration of the COVID-19 declaration under Section 56 4(b)(1) of the Act, 21 U.S.C. section 360bbb-3(b)(1), unless the authorization is terminated or revoked sooner. Performed at [facility], [place] [address] [place], [place] [address]          Radiology Studies: Dg Chest 2 View  Result Date: 01/02/2019 CLINICAL DATA:  Progressive shortness of breath for 2 weeks. EXAM: CHEST - 2 VIEW COMPARISON:  06/03/2018 FINDINGS: Chronic cardiomegaly. Pulmonary vascularity is normal and the lungs are clear. Aortic atherosclerosis. No acute bone abnormality. No effusions. IMPRESSION: 1. No active cardiopulmonary disease. 2. Chronic cardiomegaly. 3. Aortic atherosclerosis. Electronically Signed   By: Lorriane Shire M.D.   On: 01/02/2019 20:53        Scheduled Meds: . budesonide (PULMICORT) nebulizer solution  0.25 mg Nebulization BID  . furosemide  80 mg Intravenous BID  . insulin aspart  0-20 Units Subcutaneous TID WC  . insulin aspart  20 Units Subcutaneous TID WC  . insulin glargine  85 Units Subcutaneous Daily  . levothyroxine  50 mcg Oral Q0600  . metoprolol succinate  50 mg Oral BID  . nystatin cream   Topical BID  . pregabalin  150 mg Oral QHS  . pregabalin  50 mg Oral Daily  . pregabalin  50 mg Oral Daily  . Rivaroxaban  15 mg Oral Q supper  . sodium chloride flush  3 mL Intravenous Q12H   Continuous Infusions: . sodium chloride       LOS: 1 day   Patient requires continued inpatient care for IV diuresis due to decompensated heart  failure with preserved ejection fraction.  Time spent: 35 min    Harold Hedge, DO [facility] Pager [phone]  If 7PM-7AM, please contact night-coverage [email or web address] Password [email or web address] 01/04/2019, 7:40 PM

## 2019-01-04 NOTE — Plan of Care (Signed)
°  Problem: Education: °Goal: Ability to demonstrate management of disease process will improve °Outcome: Progressing °Goal: Ability to verbalize understanding of medication therapies will improve °Outcome: Progressing °Goal: Individualized Educational Video(s) °Outcome: Progressing °  °

## 2019-01-04 NOTE — Progress Notes (Signed)
CRITICAL VALUE ALERT  Critical Value:  Potassium 2.6  Date & Time Notied:  01/04/19 0720  Provider Notified: Neysa Bonito  Orders Received/Actions taken: Awaiting orders. Will pass to dayshift RN

## 2019-01-05 LAB — BASIC METABOLIC PANEL
Anion gap: 12 (ref 5–15)
BUN: 50 mg/dL — ABNORMAL HIGH (ref 8–23)
CO2: 30 mmol/L (ref 22–32)
Calcium: 9.2 mg/dL (ref 8.9–10.3)
Chloride: 99 mmol/L (ref 98–111)
Creatinine, Ser: 2.01 mg/dL — ABNORMAL HIGH (ref 0.44–1.00)
GFR calc Af Amer: 28 mL/min — ABNORMAL LOW (ref >60)
GFR calc non Af Amer: 24 mL/min — ABNORMAL LOW (ref >60)
Glucose, Bld: 73 mg/dL (ref 70–99)
Potassium: 3.4 mmol/L — ABNORMAL LOW (ref 3.5–5.1)
Sodium: 141 mmol/L (ref 135–145)

## 2019-01-05 LAB — GLUCOSE, CAPILLARY
Glucose-Capillary: 58 mg/dL — ABNORMAL LOW (ref 70–99)
Glucose-Capillary: 92 mg/dL (ref 70–99)
Glucose-Capillary: 302 mg/dL — ABNORMAL HIGH (ref 70–99)
Glucose-Capillary: 190 mg/dL — ABNORMAL HIGH (ref 70–99)

## 2019-01-05 MED ORDER — INSULIN ASPART 100 UNIT/ML ~~LOC~~ SOLN
0.0000 [IU] | Freq: Three times a day (TID) | SUBCUTANEOUS | Status: DC
  Administered 2019-01-05: 11 [IU] via SUBCUTANEOUS

## 2019-01-05 MED ORDER — INSULIN ASPART 100 UNIT/ML ~~LOC~~ SOLN: 15.0000 [IU] | Freq: Three times a day (TID) | SUBCUTANEOUS | 1 refills | Status: DC

## 2019-01-05 MED ORDER — POTASSIUM CHLORIDE CRYS ER 20 MEQ PO TBCR
60.0000 meq | EXTENDED_RELEASE_TABLET | Freq: Once | ORAL | Status: AC
  Administered 2019-01-05: 60 meq via ORAL
  Filled 2019-01-05: qty 3

## 2019-01-05 MED ORDER — TRESIBA FLEXTOUCH 200 UNIT/ML ~~LOC~~ SOPN: 50.0000 [IU] | PEN_INJECTOR | Freq: Two times a day (BID) | SUBCUTANEOUS | Status: DC

## 2019-01-05 MED ORDER — INSULIN GLARGINE 100 UNIT/ML ~~LOC~~ SOLN: 75.0000 [IU] | Freq: Every day | SUBCUTANEOUS | Status: DC

## 2019-01-05 MED ORDER — POTASSIUM CHLORIDE CRYS ER 20 MEQ PO TBCR: 20.0000 meq | EXTENDED_RELEASE_TABLET | Freq: Once | ORAL | Status: DC

## 2019-01-05 MED ORDER — INSULIN ASPART 100 UNIT/ML ~~LOC~~ SOLN
15.0000 [IU] | Freq: Three times a day (TID) | SUBCUTANEOUS | Status: DC
  Administered 2019-01-05: 15 [IU] via SUBCUTANEOUS

## 2019-01-05 NOTE — Care Management Important Message (Signed)
Important Message  Patient Details  Name: Kristine [name] MRN: [id number] Date of Birth: 02-06-1946   Medicare Important Message Given:  Yes     Shelda Altes 01/05/2019, 2:01 PM

## 2019-01-05 NOTE — Progress Notes (Addendum)
Inpatient Diabetes Program Recommendations  AACE/ADA: New Consensus Statement on Inpatient Glycemic Control (2015)  Target Ranges:  Prepandial:   less than 140 mg/dL      Peak postprandial:   less than 180 mg/dL (1-2 hours)      Critically ill patients:  140 - 180 mg/dL   Lab Results  Component Value Date   GLUCAP 92 01/05/2019   HGBA1C 8.8 (H) 01/03/2019    Review of Glycemic Control Results for NANDIKA, STETZER (MRN [id number]) as of 01/05/2019 11:25  Ref. Range 01/05/2019 04:09 01/05/2019 05:19  Glucose-Capillary Latest Ref Range: 70 - 99 mg/dL 58 (L) 92  Diabetes history: DM 2 Outpatient Diabetes medications: Tresiba 100 units bid, Novolog 30 units tid Current orders for Inpatient glycemic control:  Lantus 85 units Daily Novolog 0-20 units tid Novolog 15 units tid meal coverage (reduced today)  Inpatient Diabetes Program Recommendations:    Note low fasting CBG.  Please reduce Lantus to 75 units q HS as well. Will likely need dose this evening. (Note that Lantus was not given this morning due to low blood sugar). If patient is d/c'd today, likely needs reduction in [place] to 50 units bid.  Tyler Aas can be given once a day, however this would give the patient the ability to hold 1/2 the dose if blood sugars are low. May also need reduction in meal coverage to 15 units tid with meals.   Thanks,  Adah Perl, RN, BC-ADM Inpatient Diabetes Coordinator Pager [phone] (8a-5p)

## 2019-01-05 NOTE — Progress Notes (Addendum)
Pt BS 85, gave [place] sandwich for snack and apple sauce. Will monitor..   9340 pt BS dropped to 58. 2 ORANGE JUICE given and graham crackers.  Rechecked BS up to 96. Pt asymptomatic.

## 2019-01-05 NOTE — Discharge Instructions (Signed)
Blood Glucose Monitoring, Adult °Monitoring your blood sugar (glucose) is an important part of managing your diabetes (diabetes mellitus). Blood glucose monitoring involves checking your blood glucose as often as directed and keeping a record (log) of your results over time. °Checking your blood glucose regularly and keeping a blood glucose log can: °· Help you and your health care provider adjust your diabetes management plan as needed, including your medicines or insulin. °· Help you understand how food, exercise, illnesses, and medicines affect your blood glucose. °· Let you know what your blood glucose is at any time. You can quickly find out if you have low blood glucose (hypoglycemia) or high blood glucose (hyperglycemia). °Your health care provider will set individualized treatment goals for you. Your goals will be based on your age, other medical conditions you have, and how you respond to diabetes treatment. Generally, the goal of treatment is to maintain the following blood glucose levels: °· Before meals (preprandial): 80-130 mg/dL (4.4-7.2 mmol/L). °· After meals (postprandial): below 180 mg/dL (10 mmol/L). °· A1c level: less than 7%. °Supplies needed: °· Blood glucose meter. °· Test strips for your meter. Each meter has its own strips. You must use the strips that came with your meter. °· A needle to prick your finger (lancet). Do not use a lancet more than one time. °· A device that holds the lancet (lancing device). °· A journal or log book to write down your results. °How to check your blood glucose ° °1. Wash your hands with soap and water. °2. Prick the side of your finger (not the tip) with the lancet. Use a different finger each time. °3. Gently rub the finger until a small drop of blood appears. °4. Follow instructions that come with your meter for inserting the test strip, applying blood to the strip, and using your blood glucose meter. °5. Write down your result and any notes. °Some meters  allow you to use areas of your body other than your finger (alternative sites) to test your blood. The most common alternative sites are: °· Forearm. °· Thigh. °· Palm of the hand. °If you think you may have hypoglycemia, or if you have a history of not knowing when your blood glucose is getting low (hypoglycemia unawareness), do not use alternative sites. Use your finger instead. Alternative sites may not be as accurate as the fingers, because blood flow is slower in these areas. This means that the result you get may be delayed, and it may be different from the result that you would get from your finger. °Follow these instructions at home: °Blood glucose log ° °· Every time you check your blood glucose, write down your result. Also write down any notes about things that may be affecting your blood glucose, such as your diet and exercise for the day. This information can help you and your health care provider: °? Look for patterns in your blood glucose over time. °? Adjust your diabetes management plan as needed. °· Check if your meter allows you to download your records to a computer. Most glucose meters store a record of glucose readings in the meter. °If you have type 1 diabetes: °· Check your blood glucose 2 or more times a day. °· Also check your blood glucose: °? Before every insulin injection. °? Before and after exercise. °? Before meals. °? 2 hours after a meal. °? Occasionally between 2:00 a.m. and 3:00 a.m., as directed. °? Before potentially dangerous tasks, like driving or using heavy machinery. °?   At bedtime. °· You may need to check your blood glucose more often, up to 6-10 times a day, if you: °? Use an insulin pump. °? Need multiple daily injections (MDI). °? Have diabetes that is not well-controlled. °? Are ill. °? Have a history of severe hypoglycemia. °? Have hypoglycemia unawareness. °If you have type 2 diabetes: °· If you take insulin or other diabetes medicines, check your blood glucose 2 or  more times a day. °· If you are on intensive insulin therapy, check your blood glucose 4 or more times a day. Occasionally, you may also need to check between 2:00 a.m. and 3:00 a.m., as directed. °· Also check your blood glucose: °? Before and after exercise. °? Before potentially dangerous tasks, like driving or using heavy machinery. °· You may need to check your blood glucose more often if: °? Your medicine is being adjusted. °? Your diabetes is not well-controlled. °? You are ill. °General tips °· Always keep your supplies with you. °· If you have questions or need help, all blood glucose meters have a 24-hour "hotline" phone number that you can call. You may also contact your health care provider. °· After you use a few boxes of test strips, adjust (calibrate) your blood glucose meter by following instructions that came with your meter. °Contact a health care provider if: °· Your blood glucose is at or above 240 mg/dL (13.3 mmol/L) for 2 days in a row. °· You have been sick or have had a fever for 2 days or longer, and you are not getting better. °· You have any of the following problems for more than 6 hours: °? You cannot eat or drink. °? You have nausea or vomiting. °? You have diarrhea. °Get help right away if: °· Your blood glucose is lower than 54 mg/dL (3 mmol/L). °· You become confused or you have trouble thinking clearly. °· You have difficulty breathing. °· You have moderate or large ketone levels in your urine. °Summary °· Monitoring your blood sugar (glucose) is an important part of managing your diabetes (diabetes mellitus). °· Blood glucose monitoring involves checking your blood glucose as often as directed and keeping a record (log) of your results over time. °· Your health care provider will set individualized treatment goals for you. Your goals will be based on your age, other medical conditions you have, and how you respond to diabetes treatment. °· Every time you check your blood glucose,  write down your result. Also write down any notes about things that may be affecting your blood glucose, such as your diet and exercise for the day. °This information is not intended to replace advice given to you by your health care provider. Make sure you discuss any questions you have with your health care provider. °Document Released: 03/12/2003 Document Revised: 12/31/2017 Document Reviewed: 08/19/2015 °Elsevier Patient Education © 2020 [employer] ° °

## 2019-01-05 NOTE — Discharge Summary (Signed)
Physician Discharge Summary  Kristine [name] VZD:[id number] DOB: 08/26/1945 DOA: 01/02/2019  PCP: Prince Solian, MD  Admit date: 01/02/2019 Discharge date: 01/05/2019  Admitted From: Home Discharged to: [place]: None Equipment/Devices: None Discharge Condition: Stable  Recommendations for Outpatient Follow-up    1. Follow up with PCP in 1 2. Please follow up BMP/CBC 3. Patient had an episode of hypoglycemia which resolved with food.  She was discharged on adjusted insulin regimen as below and instructed to adhere to proper diet and monitor her blood sugars regularly.  Please follow-up on her blood sugar with adjusted insulin 4. Follow-up volume status  Medication Adjustments at Discharge  1. Tresiba 100 units twice daily-> 50 units twice daily 2. NovoLog 30 units 3 times daily-> 15 units 3 times daily with meals   Hospital Summary  73 y.o.femalewith medical history significant of DM2, HTN,CHF, listed as systolic though EF is low normal at 50-55% as of March. Has baseline CKD 4. Reports 20 lb wt gain over past 1 month and now onset of SOB progressively over the past 2 weeks, as well as orthopnea, exertional intolerance, no chest pain. She admits to not following diet recommendations, and does frequently eat processed and salty foods.  She reports compliance with her diuretic regimen of Lasix 160 mg twice a day and Zaroxolyn 3 times a week.  She reported taking Zaroxolyn before coming in to ED. BNP 243, slightly up from when she was admitted for CHF back in March of this year. Also with significant peripheral edema and abdominal distention.    Chest x-ray showed pulmonary vascular congestion. Creat 2.1 (about baseline).  She was given 80mg  IV lasix in ED, with effective diuresis and improvement in her symptoms.  She was admitted for further IV diuresis given her acute decompensation.  She was evaluated by cardiology and continued on IV diuresis throughout  hospitalization and discharged in stable condition and euvolemic back on home regimen.  She was found to be in acute exacerbation due to increased salt intake and poor dietary compliance.  She was educated on proper diet in the heart failure setting and understood and agreed to comply.  Additionally, patient had several adjustments to her insulin regimen as her blood sugar was well controlled.  Patient did have an episode of hypoglycemia on 10/14 overnight 10/15 which resolved with a snack.  Diabetic coordinator recommended insulin regimen of Tresiba 50 units twice daily and NovoLog 15 units 3 times daily with meals.  She agreed to frequently check her blood sugar and call her PCP if any issues as well as have a sugary snack if hypoglycemic.  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   - Cardiology consulted and resumed home meds at discharge with potassium supplement -Counseled on proper diet -Follow-up with cardio outpatient  Hypoglycemia in setting of Type 2 Diabetes with nephropathy, peripheral neuropathy: A1c 8.8 Home regimen is Tresiba 100 units twice daily and NovoLog 30 units 3 times daily plus sliding scale.  Hypoglycemia 58 with improvement with meal. -Diabetic coordinator consult -Tresiba 50 units twice daily, NovoLog 15 units 3 times daily with meals and close follow-up with PCP -Counseled on proper insulin administration -Continue home Lyrica 50 mg a.m. and lunch, 150 at  bedtime  Persistent A. Fib: Currently rate controlled.  Follows with Dr. Cathie Olden. - Continue home Toprol 50 daily - Continue home Xarelto 15 daily  Hypokalemia  home supplement  Elevated troponin: demand ischemia versus poor clearance in the setting of CKD stage IV.  Also with pulmonary hypertension.  No intervention with cardiology this day  Chronic kidney disease, stage IV:  Improved with diuresis - BMP in 1 week  Candidiasis of groin: Present on admission, improved.  Received Nystatin cream to affected areas twice daily.  Hypothyroidism: Chronic.  Continue home Synthroid 50 mcg daily.  Obstructive sleep apnea: CPAP  Pulmonary hypertension:  Essential hypertension: Stable  Gout: Chronic, not currently flared.  Continue home meds  Morbid obesity: Patient counseled on diet and lifestyle modifications.  Anxiety: Chronic, stable.  Continue home Xanax 0.25 twice daily as needed.   Code Status: DNR Diet recommendation: Heart healthy, carbohydrate controlled Discussed with family at bedside Consultants   Cardiology   Subjective  Seen and examined laying flat in bed in no acute distress and resting comfortably.  States she feels well and is ready to go home with close follow-up.  Denies any acute complaints   Objective   Discharge Exam: Vitals:   01/05/19 0829 01/05/19 1324  BP:  (!) 116/57  Pulse: 87 71  Resp: 18 18  Temp:  98.2 F (36.8 C)  SpO2: 95% 98%   Vitals:   01/05/19 0628 01/05/19 0827 01/05/19 0829 01/05/19 1324  BP:  117/73  (!) 116/57  Pulse:  74 87 71  Resp:  18 18 18   Temp:  97.6 F (36.4 C)  98.2 F (36.8 C)  TempSrc:  Oral  Oral  SpO2:  (!) 88% 95% 98%  Weight: 131.9 kg     Height:        Physical Exam Vitals signs and nursing note reviewed.  Constitutional:      Appearance: She is obese.  HENT:     Head: Normocephalic and atraumatic.  Eyes:     Pupils: Pupils are equal, round, and reactive to light.  Cardiovascular:      Rate and Rhythm: Normal rate and regular rhythm.  Pulmonary:     Effort: Pulmonary effort is normal. No tachypnea.  Abdominal:     General: Bowel sounds are normal.     Palpations: Abdomen is soft.  Musculoskeletal: Normal range of motion.     Right lower leg: No edema.     Left lower leg: No edema.  Skin:    Coloration: Skin is not cyanotic.     Findings: No ecchymosis.  Neurological:     General: No focal deficit present.     Mental Status: She is alert.  Psychiatric:        Mood and Affect: Mood normal.        Behavior: Behavior normal.      Allergies as of 01/05/2019      Reactions   Albuterol Other (See Comments)   REACTION: Tachycardia- AFib   Epinephrine Other (See Comments)   Increases heart rate per patient.    Penicillins Other (See Comments)   REACTION: flushing \\T \ hot Did it involve swelling of the face/tongue/throat, SOB, or low BP? No Did it involve sudden or  severe rash/hives, skin peeling, or any reaction on the inside of your mouth or nose? No Did you need to seek medical attention at a hospital or doctor's office? No When did it last happen?20 years ago If all above answers are NO, may proceed with cephalosporin use.   Torsemide Swelling   Bee Venom Other (See Comments)   "makes me nervous"   Ivp Dye [iodinated Diagnostic Agents] Other (See Comments)   flushing      Medication List    STOP taking these medications   NovoLOG FlexPen 100 UNIT/ML FlexPen Generic drug: insulin aspart Replaced by: insulin aspart 100 UNIT/ML injection     TAKE these medications   ALPRAZolam 0.25 MG tablet Commonly known as: XANAX Take 0.125-0.25 mg by mouth 2 (two) times daily as needed for anxiety.   BD Pen Needle Nano U/F 32G X 4 MM Misc Generic drug: Insulin Pen Needle Inject 1 each as directed See admin instructions. Use pen needles with insulin pens daily   budesonide 0.25 MG/2ML nebulizer solution Commonly known as: PULMICORT Take 2 mLs (0.25 mg  total) by nebulization 2 (two) times daily.   colchicine 0.6 MG tablet Take 0.5 tablets by mouth 2 (two) times daily as needed. For gout flare ups   estradiol 0.1 MG/GM vaginal cream Commonly known as: ESTRACE Place 1 Applicatorful vaginally as needed (Estrace).   furosemide 80 MG tablet Commonly known as: LASIX Take 80 mg by mouth 3 (three) times daily.   insulin aspart 100 UNIT/ML injection Commonly known as: novoLOG Inject 15 Units into the skin 3 (three) times daily with meals. Replaces: NovoLOG FlexPen 100 UNIT/ML FlexPen   Klor-Con M20 20 MEQ tablet Generic drug: potassium chloride SA Take 60 mEq by mouth 2 (two) times daily.   levalbuterol 0.63 MG/3ML nebulizer solution Commonly known as: XOPENEX Take 3 mLs (0.63 mg total) by nebulization every 8 (eight) hours as needed for up to 30 days for wheezing or shortness of breath.   levothyroxine 50 MCG tablet Commonly known as: SYNTHROID Take 50 mcg by mouth daily.   Lyrica 50 MG capsule Generic drug: pregabalin Take 50-150 mg by mouth See admin instructions. Take 50 mg in the morning 50 mg at lunch and up to 150mg  at bedtime   metolazone 2.5 MG tablet Commonly known as: ZAROXOLYN Take 1 tablet by mouth 3 (three) times a week. Mon, Wed, Fri   metoprolol succinate 50 MG 24 hr tablet Commonly known as: TOPROL-XL Take 1 tablet (50 mg total) by mouth 2 (two) times daily. Take with or immediately following a meal.   Tyler Aas FlexTouch 200 UNIT/ML Sopn Generic drug: Insulin Degludec Inject 50 Units into the skin 2 (two) times daily. What changed: how much to take   Uloric 80 MG Tabs Generic drug: Febuxostat Take 1 tablet by mouth daily.   Vitamin D (Ergocalciferol) 1.25 MG (50000 UT) Caps capsule Commonly known as: DRISDOL Take 50,000 Units by mouth every 7 (seven) days. Takes on Thursday   Xarelto 15 MG Tabs tablet Generic drug: Rivaroxaban Take 15 mg by mouth daily with supper.       The results of  significant diagnostics from this hospitalization (including imaging, microbiology, ancillary and laboratory) are listed below for reference.     Microbiology: Recent Results (from the past 240 hour(s))  SARS CORONAVIRUS 2 (TAT 6-24 HRS) Nasopharyngeal Nasopharyngeal Swab     Status: None   Collection Time: 01/03/19  5:25 AM   Specimen: Nasopharyngeal Swab  Result Value  Ref Range Status   SARS Coronavirus 2 NEGATIVE NEGATIVE Final    Comment: (NOTE) SARS-CoV-2 target nucleic acids are NOT DETECTED. The SARS-CoV-2 RNA is generally detectable in upper and lower respiratory specimens during the acute phase of infection. Negative results do not preclude SARS-CoV-2 infection, do not rule out co-infections with other pathogens, and should not be used as the sole basis for treatment or other patient management decisions. Negative results must be combined with clinical observations, patient history, and epidemiological information. The expected result is Negative. Fact Sheet for Patients: SugarRoll.be Fact Sheet for Healthcare Providers: https://www.woods-mathews.com/ This test is not yet approved or cleared by the [place] FDA and  has been authorized for detection and/or diagnosis of SARS-CoV-2 by FDA under an Emergency Use Authorization (EUA). This EUA will remain  in effect (meaning this test can be used) for the duration of the COVID-19 declaration under Section 56 4(b)(1) of the Act, 21 U.S.C. section 360bbb-3(b)(1), unless the authorization is terminated or revoked sooner. Performed at [facility], [place] [address] [place], [place] [address]      Labs: BNP (last 3 results) Recent Labs    06/03/18 1117 01/03/19 0408  BNP 198.3* 951.8*   Basic Metabolic Panel: Recent Labs  Lab 01/02/19 2031 01/04/19 0552 01/04/19 1307 01/05/19 0441  NA 141 142 138 141  K 4.3 2.6* 3.2* 3.4*  CL 100 98 96* 99  CO2 30 31 27 30     GLUCOSE 173* 116* 277* 73  BUN 50* 50* 49* 50*  CREATININE 2.17* 1.88* 1.97* 2.01*  CALCIUM 9.2 9.4 9.1 9.2  MG  --  2.4  --   --    Liver Function Tests: No results for input(s): AST, ALT, ALKPHOS, BILITOT, PROT, ALBUMIN in the last 168 hours. No results for input(s): LIPASE, AMYLASE in the last 168 hours. No results for input(s): AMMONIA in the last 168 hours. CBC: Recent Labs  Lab 01/02/19 2031  WBC 8.0  HGB 13.9  HCT 43.5  MCV 93.1  PLT 121*   Cardiac Enzymes: No results for input(s): CKTOTAL, CKMB, CKMBINDEX, TROPONINI in the last 168 hours. BNP: Invalid input(s): POCBNP CBG: Recent Labs  Lab 01/04/19 1818 01/04/19 2139 01/05/19 0409 01/05/19 0519 01/05/19 1309  GLUCAP 155* 85 58* 92 302*   D-Dimer No results for input(s): DDIMER in the last 72 hours. Hgb A1c Recent Labs    01/03/19 0606  HGBA1C 8.8*   Lipid Profile No results for input(s): CHOL, HDL, LDLCALC, TRIG, CHOLHDL, LDLDIRECT in the last 72 hours. Thyroid function studies No results for input(s): TSH, T4TOTAL, T3FREE, THYROIDAB in the last 72 hours.  Invalid input(s): FREET3 Anemia work up No results for input(s): VITAMINB12, FOLATE, FERRITIN, TIBC, IRON, RETICCTPCT in the last 72 hours. Urinalysis    Component Value Date/Time   COLORURINE YELLOW 06/03/2018 1252   APPEARANCEUR CLEAR 06/03/2018 1252   LABSPEC 1.011 06/03/2018 1252   PHURINE 6.0 06/03/2018 1252   GLUCOSEU NEGATIVE 06/03/2018 1252   HGBUR NEGATIVE 06/03/2018 1252   BILIRUBINUR NEGATIVE 06/03/2018 1252   KETONESUR NEGATIVE 06/03/2018 1252   PROTEINUR NEGATIVE 06/03/2018 1252   UROBILINOGEN 1.0 11/10/2013 2324   NITRITE POSITIVE (A) 06/03/2018 1252   LEUKOCYTESUR TRACE (A) 06/03/2018 1252   Sepsis Labs Invalid input(s): PROCALCITONIN,  WBC,  LACTICIDVEN Microbiology Recent Results (from the past 240 hour(s))  SARS CORONAVIRUS 2 (TAT 6-24 HRS) Nasopharyngeal Nasopharyngeal Swab     Status: None   Collection Time:  01/03/19  5:25 AM  Specimen: Nasopharyngeal Swab  Result Value Ref Range Status   SARS Coronavirus 2 NEGATIVE NEGATIVE Final    Comment: (NOTE) SARS-CoV-2 target nucleic acids are NOT DETECTED. The SARS-CoV-2 RNA is generally detectable in upper and lower respiratory specimens during the acute phase of infection. Negative results do not preclude SARS-CoV-2 infection, do not rule out co-infections with other pathogens, and should not be used as the sole basis for treatment or other patient management decisions. Negative results must be combined with clinical observations, patient history, and epidemiological information. The expected result is Negative. Fact Sheet for Patients: SugarRoll.be Fact Sheet for Healthcare Providers: https://www.woods-mathews.com/ This test is not yet approved or cleared by the [place] FDA and  has been authorized for detection and/or diagnosis of SARS-CoV-2 by FDA under an Emergency Use Authorization (EUA). This EUA will remain  in effect (meaning this test can be used) for the duration of the COVID-19 declaration under Section 56 4(b)(1) of the Act, 21 U.S.C. section 360bbb-3(b)(1), unless the authorization is terminated or revoked sooner. Performed at [facility], [place] [address] [place], [place] [address]     Discharge Instructions      Discharge Instructions     Blood Glucose Monitoring, Adult Monitoring your blood sugar (glucose) is an important part of managing your diabetes (diabetes mellitus). Blood glucose monitoring involves checking your blood glucose as often as directed and keeping a record (log) of your results over time. Checking your blood glucose regularly and keeping a blood glucose log can:  Help you and your health care provider adjust your diabetes management plan as needed, including your medicines or insulin.  Help you understand how food, exercise, illnesses, and  medicines affect your blood glucose.  Let you know what your blood glucose is at any time. You can quickly find out if you have low blood glucose (hypoglycemia) or high blood glucose (hyperglycemia). Your health care provider will set individualized treatment goals for you. Your goals will be based on your age, other medical conditions you have, and how you respond to diabetes treatment. Generally, the goal of treatment is to maintain the following blood glucose levels:  Before meals (preprandial): 80-130 mg/dL (4.4-7.2 mmol/L).  After meals (postprandial): below 180 mg/dL (10 mmol/L).  A1c level: less than 7%. Supplies needed:  Blood glucose meter.  Test strips for your meter. Each meter has its own strips. You must use the strips that came with your meter.  A needle to prick your finger (lancet). Do not use a lancet more than one time.  A device that holds the lancet (lancing device).  A journal or log book to write down your results. How to check your blood glucose  1. Wash your hands with soap and water. 2. Prick the side of your finger (not the tip) with the lancet. Use a different finger each time. 3. Gently rub the finger until a small drop of blood appears. 4. Follow instructions that come with your meter for inserting the test strip, applying blood to the strip, and using your blood glucose meter. 5. Write down your result and any notes. Some meters allow you to use areas of your body other than your finger (alternative sites) to test your blood. The most common alternative sites are:  Forearm.  Thigh.  Palm of the hand. If you think you may have hypoglycemia, or if you have a history of not knowing when your blood glucose is getting low (hypoglycemia unawareness), do not use alternative sites.  Use your finger instead. Alternative sites may not be as accurate as the fingers, because blood flow is slower in these areas. This means that the result you get may be delayed, and  it may be different from the result that you would get from your finger. Follow these instructions at home: Blood glucose log   Every time you check your blood glucose, write down your result. Also write down any notes about things that may be affecting your blood glucose, such as your diet and exercise for the day. This information can help you and your health care provider: ? Look for patterns in your blood glucose over time. ? Adjust your diabetes management plan as needed.  Check if your meter allows you to download your records to a computer. Most glucose meters store a record of glucose readings in the meter. If you have type 1 diabetes:  Check your blood glucose 2 or more times a day.  Also check your blood glucose: ? Before every insulin injection. ? Before and after exercise. ? Before meals. ? 2 hours after a meal. ? Occasionally between 2:00 a.m. and 3:00 a.m., as directed. ? Before potentially dangerous tasks, like driving or using heavy machinery. ? At bedtime.  You may need to check your blood glucose more often, up to 6-10 times a day, if you: ? Use an insulin pump. ? Need multiple daily injections (MDI). ? Have diabetes that is not well-controlled. ? Are ill. ? Have a history of severe hypoglycemia. ? Have hypoglycemia unawareness. If you have type 2 diabetes:  If you take insulin or other diabetes medicines, check your blood glucose 2 or more times a day.  If you are on intensive insulin therapy, check your blood glucose 4 or more times a day. Occasionally, you may also need to check between 2:00 a.m. and 3:00 a.m., as directed.  Also check your blood glucose: ? Before and after exercise. ? Before potentially dangerous tasks, like driving or using heavy machinery.  You may need to check your blood glucose more often if: ? Your medicine is being adjusted. ? Your diabetes is not well-controlled. ? You are ill. General tips  Always keep your supplies with  you.  If you have questions or need help, all blood glucose meters have a 24-hour "hotline" phone number that you can call. You may also contact your health care provider.  After you use a few boxes of test strips, adjust (calibrate) your blood glucose meter by following instructions that came with your meter. Contact a health care provider if:  Your blood glucose is at or above 240 mg/dL (13.3 mmol/L) for 2 days in a row.  You have been sick or have had a fever for 2 days or longer, and you are not getting better.  You have any of the following problems for more than 6 hours: ? You cannot eat or drink. ? You have nausea or vomiting. ? You have diarrhea. Get help right away if:  Your blood glucose is lower than 54 mg/dL (3 mmol/L).  You become confused or you have trouble thinking clearly.  You have difficulty breathing.  You have moderate or large ketone levels in your urine. Summary  Monitoring your blood sugar (glucose) is an important part of managing your diabetes (diabetes mellitus).  Blood glucose monitoring involves checking your blood glucose as often as directed and keeping a record (log) of your results over time.  Your health care provider will set individualized treatment  goals for you. Your goals will be based on your age, other medical conditions you have, and how you respond to diabetes treatment.  Every time you check your blood glucose, write down your result. Also write down any notes about things that may be affecting your blood glucose, such as your diet and exercise for the day. This information is not intended to replace advice given to you by your health care provider. Make sure you discuss any questions you have with your health care provider. Document Released: 03/12/2003 Document Revised: 12/31/2017 Document Reviewed: 08/19/2015 Elsevier Patient Education  [place].       Discharge Instructions    Diet - low sodium heart healthy    Complete by: As directed    Diet Carb Modified   Complete by: As directed    Discharge instructions   Complete by: As directed    You were seen and examined in the hospital for failure exacerbation due to high salt diet and had an episode of low blood sugar and cared for by a hospitalist.   Make an appointment to see your primary physician for follow up within 7 days of hospital discharge.  Get lab work prior to your follow up appointment Bring all home medications to your appointment to review Request that your primary physician go over all hospital tests and procedures/radiological results at the follow up.   Please get all hospital records sent to your physician by signing a hospital release before you go home.  Medication adjustments at discharge: 1.  Change Tresiba 100 units twice daily to [place] 50 units twice daily with first dose this evening 2.  Change NovoLog 30 units 3 times daily with meals to NovoLog 15 units 3 times daily with meals  Check your blood sugar 4 times daily Follow-up with your primary care physician with blood work within the next several days  Read the complete instructions along with all the possible side effects for all the medicines you take and that have been prescribed to you. Take any new medicines after you have completely understood and accept all the possible adverse reactions/side effects.   If you have any questions about your discharge medications or the care you received while you were in the hospital, you can call the unit and asked to speak with the hospitalist on call. Once you are discharged, your primary care physician will handle any further medical issues. Please note that NO REFILLS for any discharge medications will be authorized, as it is imperative that you return to your primary care physician (or establish a relationship with a primary care physician if you do not have one) for your aftercare needs so that they can reassess your need for  medications and monitor your lab values.   Do not drive, operate heavy machinery, perform activities at heights, swimming or participation in water activities or provide baby sitting services if your were admitted for loss of consciousness/seizures or if you are on sedating medications including, but not limited to benzodiazepines, sleep medications, narcotic pain medications, etc., until you have been cleared to do so by a medical doctor.   Do not take more than prescribed medications.   Wear a seat belt while driving.  If you have smoked or chewed Tobacco in the last 2 years please stop smoking; also stop any regular Alcohol and/or any Recreational drug use including marijuana.  If you experience worsening of your admission symptoms or develop shortness of breath, chest pain, suicidal or homicidal  thoughts or experience a life threatening emergency, you must seek medical attention immediately by calling 911 or calling your MD immediately.   Heart Failure patients record your daily weight using the same scale at the same time of day   Complete by: As directed    Increase activity slowly   Complete by: As directed    STOP any activity that causes chest pain, shortness of breath, dizziness, sweating, or exessive weakness   Complete by: As directed      Allergies as of 01/05/2019      Reactions   Albuterol Other (See Comments)   REACTION: Tachycardia- AFib   Epinephrine Other (See Comments)   Increases heart rate per patient.    Penicillins Other (See Comments)   REACTION: flushing \\T \ hot Did it involve swelling of the face/tongue/throat, SOB, or low BP? No Did it involve sudden or severe rash/hives, skin peeling, or any reaction on the inside of your mouth or nose? No Did you need to seek medical attention at a hospital or doctor's office? No When did it last happen?20 years ago If all above answers are NO, may proceed with cephalosporin use.   Torsemide Swelling   Bee  Venom Other (See Comments)   "makes me nervous"   Ivp Dye [iodinated Diagnostic Agents] Other (See Comments)   flushing      Medication List    STOP taking these medications   NovoLOG FlexPen 100 UNIT/ML FlexPen Generic drug: insulin aspart Replaced by: insulin aspart 100 UNIT/ML injection     TAKE these medications   ALPRAZolam 0.25 MG tablet Commonly known as: XANAX Take 0.125-0.25 mg by mouth 2 (two) times daily as needed for anxiety.   BD Pen Needle Nano U/F 32G X 4 MM Misc Generic drug: Insulin Pen Needle Inject 1 each as directed See admin instructions. Use pen needles with insulin pens daily   budesonide 0.25 MG/2ML nebulizer solution Commonly known as: PULMICORT Take 2 mLs (0.25 mg total) by nebulization 2 (two) times daily.   colchicine 0.6 MG tablet Take 0.5 tablets by mouth 2 (two) times daily as needed. For gout flare ups   estradiol 0.1 MG/GM vaginal cream Commonly known as: ESTRACE Place 1 Applicatorful vaginally as needed (Estrace).   furosemide 80 MG tablet Commonly known as: LASIX Take 80 mg by mouth 3 (three) times daily.   insulin aspart 100 UNIT/ML injection Commonly known as: novoLOG Inject 15 Units into the skin 3 (three) times daily with meals. Replaces: NovoLOG FlexPen 100 UNIT/ML FlexPen   Klor-Con M20 20 MEQ tablet Generic drug: potassium chloride SA Take 60 mEq by mouth 2 (two) times daily.   levalbuterol 0.63 MG/3ML nebulizer solution Commonly known as: XOPENEX Take 3 mLs (0.63 mg total) by nebulization every 8 (eight) hours as needed for up to 30 days for wheezing or shortness of breath.   levothyroxine 50 MCG tablet Commonly known as: SYNTHROID Take 50 mcg by mouth daily.   Lyrica 50 MG capsule Generic drug: pregabalin Take 50-150 mg by mouth See admin instructions. Take 50 mg in the morning 50 mg at lunch and up to 150mg  at bedtime   metolazone 2.5 MG tablet Commonly known as: ZAROXOLYN Take 1 tablet by mouth 3 (three) times  a week. Mon, Wed, Fri   metoprolol succinate 50 MG 24 hr tablet Commonly known as: TOPROL-XL Take 1 tablet (50 mg total) by mouth 2 (two) times daily. Take with or immediately following a meal.   Tyler Aas FlexTouch 200  UNIT/ML Sopn Generic drug: Insulin Degludec Inject 50 Units into the skin 2 (two) times daily. What changed: how much to take   Uloric 80 MG Tabs Generic drug: Febuxostat Take 1 tablet by mouth daily.   Vitamin D (Ergocalciferol) 1.25 MG (50000 UT) Caps capsule Commonly known as: DRISDOL Take 50,000 Units by mouth every 7 (seven) days. Takes on Thursday   Xarelto 15 MG Tabs tablet Generic drug: Rivaroxaban Take 15 mg by mouth daily with supper.      Follow-up Information    Daune Perch, NP Follow up on 01/26/2019.   Specialty: Cardiology Why: at 8am for your follow up appt.  Contact information: [address] [place] [address] [phone]          Allergies  Allergen Reactions   Albuterol Other (See Comments)    REACTION: Tachycardia- AFib   Epinephrine Other (See Comments)    Increases heart rate per patient.    Penicillins Other (See Comments)    REACTION: flushing \\T \ hot  Did it involve swelling of the face/tongue/throat, SOB, or low BP? No Did it involve sudden or severe rash/hives, skin peeling, or any reaction on the inside of your mouth or nose? No Did you need to seek medical attention at a hospital or doctor's office? No When did it last happen?20 years ago If all above answers are NO, may proceed with cephalosporin use.   Torsemide Swelling   Bee Venom Other (See Comments)    "makes me nervous"   Ivp Dye [Iodinated Diagnostic Agents] Other (See Comments)    flushing    Time coordinating discharge: Over 30 minutes   SIGNED:   Harold Hedge, D.O. [facility] 01/05/2019, 3:49 PM

## 2019-01-05 NOTE — Progress Notes (Addendum)
Progress Note  Patient Name: Kristine [name] Date of Encounter: 01/05/2019  Primary Cardiologist: Mertie Moores, MD   Subjective   Feeling great this morning. Hoping to go home.   Inpatient Medications    Scheduled Meds: . budesonide (PULMICORT) nebulizer solution  0.25 mg Nebulization BID  . furosemide  80 mg Intravenous BID  . insulin aspart  0-20 Units Subcutaneous TID WC  . insulin aspart  15 Units Subcutaneous TID WC  . insulin glargine  85 Units Subcutaneous Daily  . levothyroxine  50 mcg Oral Q0600  . metoprolol succinate  50 mg Oral BID  . nystatin cream   Topical BID  . potassium chloride  20 mEq Oral Once  . pregabalin  150 mg Oral QHS  . pregabalin  50 mg Oral Daily  . pregabalin  50 mg Oral Daily  . Rivaroxaban  15 mg Oral Q supper  . sodium chloride flush  3 mL Intravenous Q12H   Continuous Infusions: . sodium chloride     PRN Meds: sodium chloride, acetaminophen, ALPRAZolam, docusate sodium, ondansetron (ZOFRAN) IV, sodium chloride flush   Vital Signs    Vitals:   01/05/19 0359 01/05/19 0628 01/05/19 0827 01/05/19 0829  BP: 116/63  117/73   Pulse: 89  74 87  Resp: 20  18 18   Temp: 97.9 F (36.6 C)  97.6 F (36.4 C)   TempSrc: Oral  Oral   SpO2: 93%  (!) 88% 95%  Weight:  131.9 kg    Height:        Intake/Output Summary (Last 24 hours) at 01/05/2019 0840 Last data filed at 01/05/2019 0500 Gross per 24 hour  Intake 237.06 ml  Output 3900 ml  Net -3662.94 ml   Last 3 Weights 01/05/2019 01/04/2019 01/03/2019  Weight (lbs) 290 lb 12.8 oz 290 lb 6.4 oz 292 lb 9.6 oz  Weight (kg) 131.906 kg 131.725 kg 132.722 kg      Telemetry    Afib rate controlled - Personally Reviewed  ECG    N/a   Physical Exam  Pleasant older WF, sitting up in bed. GEN: No acute distress.   Neck: No JVD Cardiac: RRR, no murmurs, rubs, or gallops.  Respiratory: Clear to auscultation bilaterally. GI: Soft, nontender, non-distended  MS: Trace edema; No  deformity. Neuro:  Nonfocal  Psych: Normal affect   Labs    High Sensitivity Troponin:   Recent Labs  Lab 01/03/19 0408 01/03/19 0606  TROPONINIHS 30* 32*      Chemistry Recent Labs  Lab 01/04/19 0552 01/04/19 1307 01/05/19 0441  NA 142 138 141  K 2.6* 3.2* 3.4*  CL 98 96* 99  CO2 31 27 30   GLUCOSE 116* 277* 73  BUN 50* 49* 50*  CREATININE 1.88* 1.97* 2.01*  CALCIUM 9.4 9.1 9.2  GFRNONAA 26* 25* 24*  GFRAA 31* 29* 28*  ANIONGAP 13 15 12      Hematology Recent Labs  Lab 01/02/19 2031  WBC 8.0  RBC 4.67  HGB 13.9  HCT 43.5  MCV 93.1  MCH 29.8  MCHC 32.0  RDW 19.7*  PLT 121*    BNP Recent Labs  Lab 01/03/19 0408  BNP 243.1*     DDimer No results for input(s): DDIMER in the last 168 hours.   Radiology    No results found.  Cardiac Studies   N/a   Patient Profile     73 y.o. female with a hx of chronic diastolic heart failure, pulmonary hypertension, CKD, persistent  fibrillation, hyperlipidemia, hypertension, diabeteswho is being seen today for the evaluation of CHFat the request of Dr. Alcario Drought.  Assessment & Plan    1. Acute on Chronic diastolic MK:LKJZ was precipitated by noncompliance with her diet. She continues to eat processed and salty foods. States her weight was up about 20 pounds over a month. She has been compliant with her home diuretic, Lasix 80mg  TID. BNP 241, but chest x-ray without overt edema. Net neg 5.8L, weight down 292.6>>290.4lbs. Feeling well today, wants to discharge. Would recommend resuming home lasix dosing, along with metolazone, K+ supplement. Have encouraged her to stick with diet and daily weights.   2. Persistent atrial fibrillation:Currently rate controlled on Toprol-XL and Xarelto.  3. CKD:Cr baseline in the past around 2.Stable at 1.8>>2.01 today.Follow with diuresis.  4. Hypertension:stable with current therapy.  5. Diabetes:Hemoglobin A1c 8.8.On SSI  6. Hypokalemia: K+ 3.4. Will  given 63meq x1 now.  For questions or updates, please contact [place] Please consult [email or web address] for contact info under   Signed, Reino Bellis, NP  01/05/2019, 8:40 AM    I have examined the patient and reviewed assessment and plan and discussed with patient.  Agree with above as stated.  Appears back to baseline.  She is able to lie flat without any difficulty.  We spoke about minimizing salt intake and avoiding processed foods.  Resume diuretics orally.  Will sign off today.  Will arrange follow-up with cardiology.  Larae Grooms

## 2019-01-05 NOTE — Progress Notes (Signed)
Patient called out-   Went to get out of bed and thinks she hit her foot on BSC.   Its started bleeding a lot.    Assessed 2nd toe on right foot.   Small cut noted.  Flushed with NS, cleansed with alcohol swab (per pt request) and applied a pressure dressing using 2x2 gauze and tape.    Patient concerned for infection (has neuropathy).  Told patient we will continue to monitor

## 2019-01-12 DIAGNOSIS — E1122 Type 2 diabetes mellitus with diabetic chronic kidney disease: Secondary | ICD-10-CM | POA: Diagnosis not present

## 2019-01-12 DIAGNOSIS — I130 Hypertensive heart and chronic kidney disease with heart failure and stage 1 through stage 4 chronic kidney disease, or unspecified chronic kidney disease: Secondary | ICD-10-CM | POA: Diagnosis not present

## 2019-01-12 DIAGNOSIS — J45909 Unspecified asthma, uncomplicated: Secondary | ICD-10-CM | POA: Diagnosis not present

## 2019-01-12 DIAGNOSIS — E11610 Type 2 diabetes mellitus with diabetic neuropathic arthropathy: Secondary | ICD-10-CM | POA: Diagnosis not present

## 2019-01-12 DIAGNOSIS — I5032 Chronic diastolic (congestive) heart failure: Secondary | ICD-10-CM | POA: Diagnosis not present

## 2019-01-12 DIAGNOSIS — R69 Illness, unspecified: Secondary | ICD-10-CM | POA: Diagnosis not present

## 2019-01-12 DIAGNOSIS — I429 Cardiomyopathy, unspecified: Secondary | ICD-10-CM | POA: Diagnosis not present

## 2019-01-12 DIAGNOSIS — N184 Chronic kidney disease, stage 4 (severe): Secondary | ICD-10-CM | POA: Diagnosis not present

## 2019-01-12 DIAGNOSIS — M810 Age-related osteoporosis without current pathological fracture: Secondary | ICD-10-CM | POA: Diagnosis not present

## 2019-01-12 DIAGNOSIS — J42 Unspecified chronic bronchitis: Secondary | ICD-10-CM | POA: Diagnosis not present

## 2019-01-13 DIAGNOSIS — B372 Candidiasis of skin and nail: Secondary | ICD-10-CM | POA: Diagnosis not present

## 2019-01-13 DIAGNOSIS — I130 Hypertensive heart and chronic kidney disease with heart failure and stage 1 through stage 4 chronic kidney disease, or unspecified chronic kidney disease: Secondary | ICD-10-CM | POA: Diagnosis not present

## 2019-01-17 DIAGNOSIS — E876 Hypokalemia: Secondary | ICD-10-CM | POA: Diagnosis not present

## 2019-01-17 DIAGNOSIS — E1159 Type 2 diabetes mellitus with other circulatory complications: Secondary | ICD-10-CM | POA: Diagnosis not present

## 2019-01-17 DIAGNOSIS — I130 Hypertensive heart and chronic kidney disease with heart failure and stage 1 through stage 4 chronic kidney disease, or unspecified chronic kidney disease: Secondary | ICD-10-CM | POA: Diagnosis not present

## 2019-01-17 DIAGNOSIS — E11649 Type 2 diabetes mellitus with hypoglycemia without coma: Secondary | ICD-10-CM | POA: Diagnosis not present

## 2019-01-17 DIAGNOSIS — N184 Chronic kidney disease, stage 4 (severe): Secondary | ICD-10-CM | POA: Diagnosis not present

## 2019-01-17 DIAGNOSIS — E039 Hypothyroidism, unspecified: Secondary | ICD-10-CM | POA: Diagnosis not present

## 2019-01-17 DIAGNOSIS — B372 Candidiasis of skin and nail: Secondary | ICD-10-CM | POA: Diagnosis not present

## 2019-01-17 DIAGNOSIS — I480 Paroxysmal atrial fibrillation: Secondary | ICD-10-CM | POA: Diagnosis not present

## 2019-01-17 DIAGNOSIS — I5043 Acute on chronic combined systolic (congestive) and diastolic (congestive) heart failure: Secondary | ICD-10-CM | POA: Diagnosis not present

## 2019-01-17 DIAGNOSIS — R7989 Other specified abnormal findings of blood chemistry: Secondary | ICD-10-CM | POA: Diagnosis not present

## 2019-01-19 DIAGNOSIS — N184 Chronic kidney disease, stage 4 (severe): Secondary | ICD-10-CM | POA: Diagnosis not present

## 2019-01-24 ENCOUNTER — Telehealth: Payer: Self-pay | Admitting: Cardiology

## 2019-01-24 DIAGNOSIS — R69 Illness, unspecified: Secondary | ICD-10-CM | POA: Diagnosis not present

## 2019-01-24 DIAGNOSIS — I429 Cardiomyopathy, unspecified: Secondary | ICD-10-CM | POA: Diagnosis not present

## 2019-01-24 DIAGNOSIS — M810 Age-related osteoporosis without current pathological fracture: Secondary | ICD-10-CM | POA: Diagnosis not present

## 2019-01-24 DIAGNOSIS — J42 Unspecified chronic bronchitis: Secondary | ICD-10-CM | POA: Diagnosis not present

## 2019-01-24 DIAGNOSIS — N184 Chronic kidney disease, stage 4 (severe): Secondary | ICD-10-CM | POA: Diagnosis not present

## 2019-01-24 DIAGNOSIS — I130 Hypertensive heart and chronic kidney disease with heart failure and stage 1 through stage 4 chronic kidney disease, or unspecified chronic kidney disease: Secondary | ICD-10-CM | POA: Diagnosis not present

## 2019-01-24 DIAGNOSIS — E1122 Type 2 diabetes mellitus with diabetic chronic kidney disease: Secondary | ICD-10-CM | POA: Diagnosis not present

## 2019-01-24 DIAGNOSIS — I5032 Chronic diastolic (congestive) heart failure: Secondary | ICD-10-CM | POA: Diagnosis not present

## 2019-01-24 DIAGNOSIS — J45909 Unspecified asthma, uncomplicated: Secondary | ICD-10-CM | POA: Diagnosis not present

## 2019-01-24 DIAGNOSIS — E11610 Type 2 diabetes mellitus with diabetic neuropathic arthropathy: Secondary | ICD-10-CM | POA: Diagnosis not present

## 2019-01-24 NOTE — Telephone Encounter (Signed)
° ° °  Patient was scheduled for 2 appointments 11/5 and 11/6. Kristine [name] not available on 11/5 at 8am. Left message vcml to confirm appt for 11/6

## 2019-01-26 ENCOUNTER — Ambulatory Visit: Payer: Medicare HMO | Admitting: Cardiology

## 2019-01-26 DIAGNOSIS — Z794 Long term (current) use of insulin: Secondary | ICD-10-CM | POA: Diagnosis not present

## 2019-01-26 DIAGNOSIS — E108 Type 1 diabetes mellitus with unspecified complications: Secondary | ICD-10-CM | POA: Diagnosis not present

## 2019-01-27 ENCOUNTER — Ambulatory Visit: Payer: Medicare HMO | Admitting: Cardiology

## 2019-01-27 NOTE — Progress Notes (Deleted)
Cardiology Office Note:    Date:  01/27/2019   ID:  Kristine [name], DOB [birth year], MRN [id number]  PCP:  Prince Solian, MD  Cardiologist:  Mertie Moores, MD  Referring MD: Prince Solian, MD   No chief complaint on file. ***  History of Present Illness:    Kristine [name] is a 73 y.o. female with a past medical history significant for chronic diastolic heart failure, pulmonary hypertension, CKD stage IV, persistent fibrillation, hyperlipidemia, hypertension, diabetes type II.   The patient was admitted to the hospital 01/02/2019-01/05/19 for acute on chronic diastolic heart failure precipitated by noncompliance with her diet, eating processed and salty foods.  Weight had been up about 20 pounds over a month and she complained of shortness of breath.  She had been compliant with her home diuretic of Lasix 80 mg 3 times daily.  BNP was 241.  Chest x-ray without overt edema.  The patient was diuresed with significant improvement in her symptoms.  Discharge weight was 290 pounds.   Hospital follow-up   BMet, CBC  CHF action plan  Cardiac studies   Echocardiogram 06/04/2018 IMPRESSIONS   1. The left ventricle has low normal systolic function, with an ejection fraction of 50-55%. The cavity size was normal. Left ventricular diastolic function could not be evaluated secondary to atrial fibrillation. There is right ventricular volume and  pressure overload.  2. The right ventricle has severely reduced systolic function. The cavity was moderately enlarged. There is mildly increased right ventricular wall thickness.  3. The mitral valve is grossly normal. Mild thickening of the mitral valve leaflet. There is mild mitral annular calcification present. Mitral valve regurgitation is moderate by color flow Doppler.  4. Mildly thickened tricuspid valve leaflets.  5. The tricuspid valve is grossly normal. Tricuspid valve regurgitation is moderate.  6. The aortic valve is  tricuspid. Moderate annular calcification.  7. The aortic root is normal in size and structure.  8. Right atrial size was mildly dilated.  9. Negative saline bubble study for PFO.  Past Medical History:  Diagnosis Date  . Allergy   . Anxiety   . Arthritis   . Asthma   . Atrial fibrillation ([place])   . Cataract   . CHF (congestive heart failure) ([place])   . Chronic bronchitis ([place])   . Chronic kidney disease   . Clotting disorder ([place])   . Depression   . GERD (gastroesophageal reflux disease)   . High cholesterol   . History of hiatal hernia   . HTN (hypertension)   . Mild aortic sclerosis   . Morbid obesity ([place])   . Neuromuscular disorder ([place])   . Osteoporosis   . Persistent atrial fibrillation ([place])   . Scoliosis   . Spinal stenosis   . Thyroid disease   . Type II diabetes mellitus ([place])   . Vitamin D deficiency     Past Surgical History:  Procedure Laterality Date  . BLADDER SUSPENSION  1980s  . CARDIAC CATHETERIZATION  11/16/09   SMOOTH AND NORMAL  . CARDIOVERSION N/A 01/07/2015   Procedure: CARDIOVERSION;  Surgeon: Skeet Latch, MD;  Location: [facility] ENDOSCOPY;  Service: Cardiovascular;  Laterality: N/A;  . CARDIOVERSION N/A 03/11/2015   Procedure: CARDIOVERSION;  Surgeon: Skeet Latch, MD;  Location: [place];  Service: Cardiovascular;  Laterality: N/A;  . CARPAL TUNNEL RELEASE Left 1970s  . CATARACT EXTRACTION W/ INTRAOCULAR LENS  IMPLANT, BILATERAL Bilateral ~ 2010  . COLONOSCOPY  08/14/08  . FINGER FRACTURE SURGERY Right 1970s   "  ring finger"  . FRACTURE SURGERY    . LAPAROSCOPIC CHOLECYSTECTOMY  1980s  . TUBAL LIGATION    . VAGINAL HYSTERECTOMY  1980    Current Medications: No outpatient medications have been marked as taking for the 01/27/19 encounter (Appointment) with Daune Perch, NP.     Allergies:   Albuterol, Epinephrine, Penicillins, Torsemide, Bee venom, and Ivp dye [iodinated diagnostic agents]   Social History   Socioeconomic  History  . Marital status: Married    Spouse name: Not on file  . Number of children: 4  . Years of education: 63  . Highest education level: Not on file  Occupational History  . Not on file  Social Needs  . Financial resource strain: Not on file  . Food insecurity    Worry: Not on file    Inability: Not on file  . Transportation needs    Medical: Not on file    Non-medical: Not on file  Tobacco Use  . Smoking status: Never Smoker  . Smokeless tobacco: Never Used  Substance and Sexual Activity  . Alcohol use: No  . Drug use: No  . Sexual activity: Yes  Lifestyle  . Physical activity    Days per week: Not on file    Minutes per session: Not on file  . Stress: Not on file  Relationships  . Social Herbalist on phone: Not on file    Gets together: Not on file    Attends religious service: Not on file    Active member of club or organization: Not on file    Attends meetings of clubs or organizations: Not on file    Relationship status: Not on file  Other Topics Concern  . Not on file  Social History Narrative   Pt lives in [place] with spouse.   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   Right-handed.   1 cup caffeine per day.     Family History: The patient's family history includes Atrial fibrillation in her father; CAD in her brother and sister; Congestive Heart Failure in her mother; Heart failure in her mother; Hypertension in her brother, father, mother, sister, and son; Stroke in her father. There is no history of Colon cancer, Esophageal cancer, Stomach cancer, or Rectal cancer. ROS:   Please see the history of present illness.     All other systems reviewed and are negative.   EKG:  EKG is not ordered today.    Recent Labs: 01/02/2019: Hemoglobin 13.9; Platelets 121 01/03/2019: B Natriuretic Peptide 243.1 01/04/2019: Magnesium 2.4 01/05/2019: BUN  50; Creatinine, Ser 2.01; Potassium 3.4; Sodium 141   Recent Lipid Panel    Component Value Date/Time   CHOL 123 06/04/2018 0535   CHOL 180 08/06/2017 1354   TRIG 105 06/04/2018 0535   HDL 25 (L) 06/04/2018 0535   HDL 34 (L) 08/06/2017 1354   CHOLHDL 4.9 06/04/2018 0535   VLDL 21 06/04/2018 0535   LDLCALC 77 06/04/2018 0535   LDLCALC 91 08/06/2017 1354    Physical Exam:    VS:  There were no vitals taken for this visit.    Wt Readings from Last 6 Encounters:  01/05/19 290 lb 12.8 oz (131.9 kg)  09/14/18 282 lb 3.2 oz (128 kg)  09/09/18 267 lb (121.1 kg)  09/05/18 283 lb (128.4 kg)  08/16/18 270 lb (122.5 kg)  06/09/18 276 lb (125.2 kg)     Physical Exam***   ASSESSMENT:    1. Acute on chronic diastolic (congestive) heart failure (HCC)   2. Other persistent atrial fibrillation ([place])   3. Hypokalemia   4. CKD (chronic kidney disease) stage 4, GFR 15-29 ml/min (HCC)   5. Essential (primary) hypertension   6. OSA on CPAP   7. Morbid obesity ([place])   8. Type 2 diabetes mellitus with diabetic polyneuropathy, with long-term current use of insulin (HCC)    PLAN:    In order of problems listed above: Acute on chronic diastolic heart failure -Recent hospitalization with volume overload related to noncompliance with diet (processed foods, pizza, corndogs).  She was diuresed with much improvement. -Patient continues on her prior diuretic regimen.  Lasix 80 mg 3 times daily and metolazone 2.5 mg 3 times per week. -  -CHF education provided including restriction of salt and fluids.  Recommend he weigh himself daily and call us for weight gain over 3 pounds overnight or 5 pounds in a 1 week time span.CHF Action Plan hand out provided.     Persistent atrial fibrillation -Rate controlled on Toprol -On Xarelto for stroke risk reduction  Hypokalemia -On potassium supplementation -We will check metabolic panel today  CKD stage IV -Serum creatinine stayed around baseline in  the hospital of 2. -We will recheck metabolic panel today.  Essential hypertension  Obstructive sleep apnea -Compliant with CPAP  Obesity  Diabetes type 2 - A1c 8.8.  Management per PCP.    Medication Adjustments/Labs and Tests Ordered: Current medicines are reviewed at length with the patient today.  Concerns regarding medicines are outlined above. Labs and tests ordered and medication changes are outlined in the patient instructions below:  Patient Instructions  Do the following things EVERY DAY:   1. Weigh yourself EVERY morning after you go to the bathroom but before you eat or drink anything. Write this number down in a weight log/diary. If you gain 3 pounds overnight or 5 pounds in a week, call the office.   2. Take your medicines as prescribed. If you have concerns about your medications, please call us before you stop taking them.    3. Eat low salt foods-Limit salt (sodium) to 2000 mg per day. This will help prevent your body from holding onto fluid. Read food labels as many processed foods have a lot of sodium, especially canned goods and prepackaged meats. If you would like some assistance choosing low sodium foods, we would be happy to set you up with a nutritionist.   4. Stay as active as you can everyday. Staying active will give you more energy and make your muscles stronger. Start with 5 minutes at a time and work your way up to 30 minutes a day. Break up your activities--do some in the morning and some in the afternoon. Start with 3 days per week and work your way up to 5 days as you can.  If you have chest pain, feel short of breath, dizzy, or lightheaded, STOP. If you don't feel better after a short rest, call 911. If you do feel better, call the office to let us know you have symptoms with exercise.   5. Limit all fluids for the day to less than 2 liters. Fluid includes all drinks, coffee, juice, ice chips, soup, jello, and all other liquids.   Low-Sodium Eating Plan  Sodium, which is an element that makes up salt, helps you maintain a healthy balance  of fluids in your body. Too much sodium can increase your blood pressure and cause fluid and waste to be held in your body. Your health care provider or dietitian may recommend following this plan if you have high blood pressure (hypertension), kidney disease, liver disease, or heart failure. Eating less sodium can help lower your blood pressure, reduce swelling, and protect your heart, liver, and kidneys. What are tips for following this plan? General guidelines  Most people on this plan should limit their sodium intake to 1,500-2,000 mg (milligrams) of sodium each day. Reading food labels   The Nutrition Facts label lists the amount of sodium in one serving of the food. If you eat more than one serving, you must multiply the listed amount of sodium by the number of servings.  Choose foods with less than 140 mg of sodium per serving.  Avoid foods with 300 mg of sodium or more per serving. Shopping  Look for lower-sodium products, often labeled as "low-sodium" or "no salt added."  Always check the sodium content even if foods are labeled as "unsalted" or "no salt added".  Buy fresh foods. ? Avoid canned foods and premade or frozen meals. ? Avoid canned, cured, or processed meats  Buy breads that have less than 80 mg of sodium per slice. Cooking  Eat more home-cooked food and less restaurant, buffet, and fast food.  Avoid adding salt when cooking. Use salt-free seasonings or herbs instead of table salt or sea salt. Check with your health care provider or pharmacist before using salt substitutes.  Cook with plant-based oils, such as canola, sunflower, or olive oil. Meal planning  When eating at a restaurant, ask that your food be prepared with less salt or no salt, if possible.  Avoid foods that contain MSG (monosodium glutamate). MSG is sometimes added to [place] food, bouillon, and some canned  foods. What foods are recommended? The items listed may not be a complete list. Talk with your dietitian about what dietary choices are best for you. Grains Low-sodium cereals, including oats, puffed wheat and rice, and shredded wheat. Low-sodium crackers. Unsalted rice. Unsalted pasta. Low-sodium bread. Whole-grain breads and whole-grain pasta. Vegetables Fresh or frozen vegetables. "No salt added" canned vegetables. "No salt added" tomato sauce and paste. Low-sodium or reduced-sodium tomato and vegetable juice. Fruits Fresh, frozen, or canned fruit. Fruit juice. Meats and other protein foods Fresh or frozen (no salt added) meat, poultry, seafood, and fish. Low-sodium canned tuna and salmon. Unsalted nuts. Dried peas, beans, and lentils without added salt. Unsalted canned beans. Eggs. Unsalted nut butters. Dairy Milk. Soy milk. Cheese that is naturally low in sodium, such as ricotta cheese, fresh mozzarella, or Swiss cheese Low-sodium or reduced-sodium cheese. Cream cheese. Yogurt. Fats and oils Unsalted butter. Unsalted margarine with no trans fat. Vegetable oils such as canola or olive oils. Seasonings and other foods Fresh and dried herbs and spices. Salt-free seasonings. Low-sodium mustard and ketchup. Sodium-free salad dressing. Sodium-free light mayonnaise. Fresh or refrigerated horseradish. Lemon juice. Vinegar. Homemade, reduced-sodium, or low-sodium soups. Unsalted popcorn and pretzels. Low-salt or salt-free chips. What foods are not recommended? The items listed may not be a complete list. Talk with your dietitian about what dietary choices are best for you. Grains Instant hot cereals. Bread stuffing, pancake, and biscuit mixes. Croutons. Seasoned rice or pasta mixes. Noodle soup cups. Boxed or frozen macaroni and cheese. Regular salted crackers. Self-rising flour. Vegetables Sauerkraut, pickled vegetables, and relishes. Olives. [place] fries. Onion rings. Regular canned vegetables  (  not low-sodium or reduced-sodium). Regular canned tomato sauce and paste (not low-sodium or reduced-sodium). Regular tomato and vegetable juice (not low-sodium or reduced-sodium). Frozen vegetables in sauces. Meats and other protein foods Meat or fish that is salted, canned, smoked, spiced, or pickled. Bacon, ham, sausage, hotdogs, corned beef, chipped beef, packaged lunch meats, salt pork, jerky, pickled herring, anchovies, regular canned tuna, sardines, salted nuts. Dairy Processed cheese and cheese spreads. Cheese curds. Blue cheese. Feta cheese. String cheese. Regular cottage cheese. Buttermilk. Canned milk. Fats and oils Salted butter. Regular margarine. Ghee. Bacon fat. Seasonings and other foods Onion salt, garlic salt, seasoned salt, table salt, and sea salt. Canned and packaged gravies. Worcestershire sauce. Tartar sauce. Barbecue sauce. Teriyaki sauce. Soy sauce, including reduced-sodium. Steak sauce. Fish sauce. Oyster sauce. Cocktail sauce. Horseradish that you find on the shelf. Regular ketchup and mustard. Meat flavorings and tenderizers. Bouillon cubes. Hot sauce and Tabasco sauce. Premade or packaged marinades. Premade or packaged taco seasonings. Relishes. Regular salad dressings. Salsa. Potato and tortilla chips. Corn chips and puffs. Salted popcorn and pretzels. Canned or dried soups. Pizza. Frozen entrees and pot pies. Summary  Eating less sodium can help lower your blood pressure, reduce swelling, and protect your heart, liver, and kidneys.  Most people on this plan should limit their sodium intake to 1,500-2,000 mg (milligrams) of sodium each day.  Canned, boxed, and frozen foods are high in sodium. Restaurant foods, fast foods, and pizza are also very high in sodium. You also get sodium by adding salt to food.  Try to cook at home, eat more fresh fruits and vegetables, and eat less fast food, canned, processed, or prepared foods. This information is not intended to replace  advice given to you by your health care provider. Make sure you discuss any questions you have with your health care provider. Document Released: 08/29/2001 Document Revised: 02/19/2017 Document Reviewed: 03/02/2016 Elsevier Patient Education  2020 [place], Daune Perch, NP  01/27/2019 4:50 AM    [place]

## 2019-01-30 DIAGNOSIS — N184 Chronic kidney disease, stage 4 (severe): Secondary | ICD-10-CM | POA: Diagnosis not present

## 2019-01-30 DIAGNOSIS — E11610 Type 2 diabetes mellitus with diabetic neuropathic arthropathy: Secondary | ICD-10-CM | POA: Diagnosis not present

## 2019-01-30 DIAGNOSIS — M810 Age-related osteoporosis without current pathological fracture: Secondary | ICD-10-CM | POA: Diagnosis not present

## 2019-01-30 DIAGNOSIS — I429 Cardiomyopathy, unspecified: Secondary | ICD-10-CM | POA: Diagnosis not present

## 2019-01-30 DIAGNOSIS — E1122 Type 2 diabetes mellitus with diabetic chronic kidney disease: Secondary | ICD-10-CM | POA: Diagnosis not present

## 2019-01-30 DIAGNOSIS — I5032 Chronic diastolic (congestive) heart failure: Secondary | ICD-10-CM | POA: Diagnosis not present

## 2019-01-30 DIAGNOSIS — I130 Hypertensive heart and chronic kidney disease with heart failure and stage 1 through stage 4 chronic kidney disease, or unspecified chronic kidney disease: Secondary | ICD-10-CM | POA: Diagnosis not present

## 2019-01-30 DIAGNOSIS — J45909 Unspecified asthma, uncomplicated: Secondary | ICD-10-CM | POA: Diagnosis not present

## 2019-01-30 DIAGNOSIS — R69 Illness, unspecified: Secondary | ICD-10-CM | POA: Diagnosis not present

## 2019-01-30 DIAGNOSIS — J42 Unspecified chronic bronchitis: Secondary | ICD-10-CM | POA: Diagnosis not present

## 2019-01-31 DIAGNOSIS — R69 Illness, unspecified: Secondary | ICD-10-CM | POA: Diagnosis not present

## 2019-01-31 DIAGNOSIS — E1122 Type 2 diabetes mellitus with diabetic chronic kidney disease: Secondary | ICD-10-CM | POA: Diagnosis not present

## 2019-01-31 DIAGNOSIS — J45909 Unspecified asthma, uncomplicated: Secondary | ICD-10-CM | POA: Diagnosis not present

## 2019-01-31 DIAGNOSIS — I5032 Chronic diastolic (congestive) heart failure: Secondary | ICD-10-CM | POA: Diagnosis not present

## 2019-01-31 DIAGNOSIS — E11610 Type 2 diabetes mellitus with diabetic neuropathic arthropathy: Secondary | ICD-10-CM | POA: Diagnosis not present

## 2019-01-31 DIAGNOSIS — I130 Hypertensive heart and chronic kidney disease with heart failure and stage 1 through stage 4 chronic kidney disease, or unspecified chronic kidney disease: Secondary | ICD-10-CM | POA: Diagnosis not present

## 2019-01-31 DIAGNOSIS — M810 Age-related osteoporosis without current pathological fracture: Secondary | ICD-10-CM | POA: Diagnosis not present

## 2019-01-31 DIAGNOSIS — J42 Unspecified chronic bronchitis: Secondary | ICD-10-CM | POA: Diagnosis not present

## 2019-01-31 DIAGNOSIS — N184 Chronic kidney disease, stage 4 (severe): Secondary | ICD-10-CM | POA: Diagnosis not present

## 2019-01-31 DIAGNOSIS — I429 Cardiomyopathy, unspecified: Secondary | ICD-10-CM | POA: Diagnosis not present

## 2019-02-03 DIAGNOSIS — J45909 Unspecified asthma, uncomplicated: Secondary | ICD-10-CM | POA: Diagnosis not present

## 2019-02-03 DIAGNOSIS — I429 Cardiomyopathy, unspecified: Secondary | ICD-10-CM | POA: Diagnosis not present

## 2019-02-03 DIAGNOSIS — J42 Unspecified chronic bronchitis: Secondary | ICD-10-CM | POA: Diagnosis not present

## 2019-02-03 DIAGNOSIS — I5032 Chronic diastolic (congestive) heart failure: Secondary | ICD-10-CM | POA: Diagnosis not present

## 2019-02-03 DIAGNOSIS — E11610 Type 2 diabetes mellitus with diabetic neuropathic arthropathy: Secondary | ICD-10-CM | POA: Diagnosis not present

## 2019-02-03 DIAGNOSIS — I130 Hypertensive heart and chronic kidney disease with heart failure and stage 1 through stage 4 chronic kidney disease, or unspecified chronic kidney disease: Secondary | ICD-10-CM | POA: Diagnosis not present

## 2019-02-03 DIAGNOSIS — E1122 Type 2 diabetes mellitus with diabetic chronic kidney disease: Secondary | ICD-10-CM | POA: Diagnosis not present

## 2019-02-03 DIAGNOSIS — R69 Illness, unspecified: Secondary | ICD-10-CM | POA: Diagnosis not present

## 2019-02-03 DIAGNOSIS — M810 Age-related osteoporosis without current pathological fracture: Secondary | ICD-10-CM | POA: Diagnosis not present

## 2019-02-03 DIAGNOSIS — N184 Chronic kidney disease, stage 4 (severe): Secondary | ICD-10-CM | POA: Diagnosis not present

## 2019-02-06 DIAGNOSIS — M7989 Other specified soft tissue disorders: Secondary | ICD-10-CM | POA: Diagnosis not present

## 2019-02-06 DIAGNOSIS — B351 Tinea unguium: Secondary | ICD-10-CM | POA: Diagnosis not present

## 2019-02-06 DIAGNOSIS — M79672 Pain in left foot: Secondary | ICD-10-CM | POA: Diagnosis not present

## 2019-02-06 DIAGNOSIS — M79675 Pain in left toe(s): Secondary | ICD-10-CM | POA: Diagnosis not present

## 2019-02-06 DIAGNOSIS — L603 Nail dystrophy: Secondary | ICD-10-CM | POA: Diagnosis not present

## 2019-02-06 DIAGNOSIS — E11610 Type 2 diabetes mellitus with diabetic neuropathic arthropathy: Secondary | ICD-10-CM | POA: Diagnosis not present

## 2019-02-06 DIAGNOSIS — M79671 Pain in right foot: Secondary | ICD-10-CM | POA: Diagnosis not present

## 2019-02-06 DIAGNOSIS — E1165 Type 2 diabetes mellitus with hyperglycemia: Secondary | ICD-10-CM | POA: Diagnosis not present

## 2019-02-07 DIAGNOSIS — E1122 Type 2 diabetes mellitus with diabetic chronic kidney disease: Secondary | ICD-10-CM | POA: Diagnosis not present

## 2019-02-07 DIAGNOSIS — I5032 Chronic diastolic (congestive) heart failure: Secondary | ICD-10-CM | POA: Diagnosis not present

## 2019-02-07 DIAGNOSIS — E11610 Type 2 diabetes mellitus with diabetic neuropathic arthropathy: Secondary | ICD-10-CM | POA: Diagnosis not present

## 2019-02-07 DIAGNOSIS — N184 Chronic kidney disease, stage 4 (severe): Secondary | ICD-10-CM | POA: Diagnosis not present

## 2019-02-07 DIAGNOSIS — J45909 Unspecified asthma, uncomplicated: Secondary | ICD-10-CM | POA: Diagnosis not present

## 2019-02-07 DIAGNOSIS — I429 Cardiomyopathy, unspecified: Secondary | ICD-10-CM | POA: Diagnosis not present

## 2019-02-07 DIAGNOSIS — R69 Illness, unspecified: Secondary | ICD-10-CM | POA: Diagnosis not present

## 2019-02-07 DIAGNOSIS — I130 Hypertensive heart and chronic kidney disease with heart failure and stage 1 through stage 4 chronic kidney disease, or unspecified chronic kidney disease: Secondary | ICD-10-CM | POA: Diagnosis not present

## 2019-02-07 DIAGNOSIS — J42 Unspecified chronic bronchitis: Secondary | ICD-10-CM | POA: Diagnosis not present

## 2019-02-07 DIAGNOSIS — M810 Age-related osteoporosis without current pathological fracture: Secondary | ICD-10-CM | POA: Diagnosis not present

## 2019-02-08 DIAGNOSIS — E1122 Type 2 diabetes mellitus with diabetic chronic kidney disease: Secondary | ICD-10-CM | POA: Diagnosis not present

## 2019-02-08 DIAGNOSIS — M810 Age-related osteoporosis without current pathological fracture: Secondary | ICD-10-CM | POA: Diagnosis not present

## 2019-02-08 DIAGNOSIS — J45909 Unspecified asthma, uncomplicated: Secondary | ICD-10-CM | POA: Diagnosis not present

## 2019-02-08 DIAGNOSIS — I429 Cardiomyopathy, unspecified: Secondary | ICD-10-CM | POA: Diagnosis not present

## 2019-02-08 DIAGNOSIS — E11610 Type 2 diabetes mellitus with diabetic neuropathic arthropathy: Secondary | ICD-10-CM | POA: Diagnosis not present

## 2019-02-08 DIAGNOSIS — J42 Unspecified chronic bronchitis: Secondary | ICD-10-CM | POA: Diagnosis not present

## 2019-02-08 DIAGNOSIS — I5032 Chronic diastolic (congestive) heart failure: Secondary | ICD-10-CM | POA: Diagnosis not present

## 2019-02-08 DIAGNOSIS — I130 Hypertensive heart and chronic kidney disease with heart failure and stage 1 through stage 4 chronic kidney disease, or unspecified chronic kidney disease: Secondary | ICD-10-CM | POA: Diagnosis not present

## 2019-02-08 DIAGNOSIS — R69 Illness, unspecified: Secondary | ICD-10-CM | POA: Diagnosis not present

## 2019-02-08 DIAGNOSIS — N184 Chronic kidney disease, stage 4 (severe): Secondary | ICD-10-CM | POA: Diagnosis not present

## 2019-02-09 DIAGNOSIS — J45909 Unspecified asthma, uncomplicated: Secondary | ICD-10-CM | POA: Diagnosis not present

## 2019-02-09 DIAGNOSIS — E1122 Type 2 diabetes mellitus with diabetic chronic kidney disease: Secondary | ICD-10-CM | POA: Diagnosis not present

## 2019-02-09 DIAGNOSIS — Z20828 Contact with and (suspected) exposure to other viral communicable diseases: Secondary | ICD-10-CM | POA: Diagnosis not present

## 2019-02-09 DIAGNOSIS — I130 Hypertensive heart and chronic kidney disease with heart failure and stage 1 through stage 4 chronic kidney disease, or unspecified chronic kidney disease: Secondary | ICD-10-CM | POA: Diagnosis not present

## 2019-02-09 DIAGNOSIS — I5043 Acute on chronic combined systolic (congestive) and diastolic (congestive) heart failure: Secondary | ICD-10-CM | POA: Diagnosis not present

## 2019-02-09 DIAGNOSIS — E11610 Type 2 diabetes mellitus with diabetic neuropathic arthropathy: Secondary | ICD-10-CM | POA: Diagnosis not present

## 2019-02-09 DIAGNOSIS — E1140 Type 2 diabetes mellitus with diabetic neuropathy, unspecified: Secondary | ICD-10-CM | POA: Diagnosis not present

## 2019-02-09 DIAGNOSIS — I429 Cardiomyopathy, unspecified: Secondary | ICD-10-CM | POA: Diagnosis not present

## 2019-02-09 DIAGNOSIS — N184 Chronic kidney disease, stage 4 (severe): Secondary | ICD-10-CM | POA: Diagnosis not present

## 2019-02-09 DIAGNOSIS — G4733 Obstructive sleep apnea (adult) (pediatric): Secondary | ICD-10-CM | POA: Diagnosis not present

## 2019-02-09 DIAGNOSIS — I5032 Chronic diastolic (congestive) heart failure: Secondary | ICD-10-CM | POA: Diagnosis not present

## 2019-02-09 DIAGNOSIS — J42 Unspecified chronic bronchitis: Secondary | ICD-10-CM | POA: Diagnosis not present

## 2019-02-09 DIAGNOSIS — R69 Illness, unspecified: Secondary | ICD-10-CM | POA: Diagnosis not present

## 2019-02-09 DIAGNOSIS — M810 Age-related osteoporosis without current pathological fracture: Secondary | ICD-10-CM | POA: Diagnosis not present

## 2019-02-09 DIAGNOSIS — E1159 Type 2 diabetes mellitus with other circulatory complications: Secondary | ICD-10-CM | POA: Diagnosis not present

## 2019-02-12 DIAGNOSIS — J45909 Unspecified asthma, uncomplicated: Secondary | ICD-10-CM | POA: Diagnosis not present

## 2019-02-12 DIAGNOSIS — I429 Cardiomyopathy, unspecified: Secondary | ICD-10-CM | POA: Diagnosis not present

## 2019-02-12 DIAGNOSIS — R69 Illness, unspecified: Secondary | ICD-10-CM | POA: Diagnosis not present

## 2019-02-12 DIAGNOSIS — M810 Age-related osteoporosis without current pathological fracture: Secondary | ICD-10-CM | POA: Diagnosis not present

## 2019-02-12 DIAGNOSIS — I130 Hypertensive heart and chronic kidney disease with heart failure and stage 1 through stage 4 chronic kidney disease, or unspecified chronic kidney disease: Secondary | ICD-10-CM | POA: Diagnosis not present

## 2019-02-12 DIAGNOSIS — I5032 Chronic diastolic (congestive) heart failure: Secondary | ICD-10-CM | POA: Diagnosis not present

## 2019-02-12 DIAGNOSIS — E1122 Type 2 diabetes mellitus with diabetic chronic kidney disease: Secondary | ICD-10-CM | POA: Diagnosis not present

## 2019-02-12 DIAGNOSIS — E11610 Type 2 diabetes mellitus with diabetic neuropathic arthropathy: Secondary | ICD-10-CM | POA: Diagnosis not present

## 2019-02-12 DIAGNOSIS — J42 Unspecified chronic bronchitis: Secondary | ICD-10-CM | POA: Diagnosis not present

## 2019-02-12 DIAGNOSIS — N184 Chronic kidney disease, stage 4 (severe): Secondary | ICD-10-CM | POA: Diagnosis not present

## 2019-02-17 DIAGNOSIS — E1122 Type 2 diabetes mellitus with diabetic chronic kidney disease: Secondary | ICD-10-CM | POA: Diagnosis not present

## 2019-02-17 DIAGNOSIS — E11610 Type 2 diabetes mellitus with diabetic neuropathic arthropathy: Secondary | ICD-10-CM | POA: Diagnosis not present

## 2019-02-17 DIAGNOSIS — R69 Illness, unspecified: Secondary | ICD-10-CM | POA: Diagnosis not present

## 2019-02-17 DIAGNOSIS — N184 Chronic kidney disease, stage 4 (severe): Secondary | ICD-10-CM | POA: Diagnosis not present

## 2019-02-17 DIAGNOSIS — M810 Age-related osteoporosis without current pathological fracture: Secondary | ICD-10-CM | POA: Diagnosis not present

## 2019-02-17 DIAGNOSIS — J45909 Unspecified asthma, uncomplicated: Secondary | ICD-10-CM | POA: Diagnosis not present

## 2019-02-17 DIAGNOSIS — I429 Cardiomyopathy, unspecified: Secondary | ICD-10-CM | POA: Diagnosis not present

## 2019-02-17 DIAGNOSIS — I130 Hypertensive heart and chronic kidney disease with heart failure and stage 1 through stage 4 chronic kidney disease, or unspecified chronic kidney disease: Secondary | ICD-10-CM | POA: Diagnosis not present

## 2019-02-17 DIAGNOSIS — J42 Unspecified chronic bronchitis: Secondary | ICD-10-CM | POA: Diagnosis not present

## 2019-02-17 DIAGNOSIS — I5032 Chronic diastolic (congestive) heart failure: Secondary | ICD-10-CM | POA: Diagnosis not present

## 2019-02-20 ENCOUNTER — Other Ambulatory Visit: Payer: Self-pay

## 2019-02-20 ENCOUNTER — Other Ambulatory Visit (HOSPITAL_COMMUNITY)
Admission: RE | Admit: 2019-02-20 | Discharge: 2019-02-20 | Disposition: A | Discharge status: Home / Self Care | Payer: Medicare HMO | Source: Ambulatory Visit | Attending: Pulmonary Disease | Admitting: Pulmonary Disease

## 2019-02-20 DIAGNOSIS — Z20828 Contact with and (suspected) exposure to other viral communicable diseases: Secondary | ICD-10-CM | POA: Insufficient documentation

## 2019-02-20 DIAGNOSIS — N184 Chronic kidney disease, stage 4 (severe): Secondary | ICD-10-CM | POA: Diagnosis not present

## 2019-02-20 DIAGNOSIS — Z01812 Encounter for preprocedural laboratory examination: Secondary | ICD-10-CM | POA: Diagnosis not present

## 2019-02-20 LAB — SARS CORONAVIRUS 2 (TAT 6-24 HRS): SARS Coronavirus 2: NEGATIVE

## 2019-02-21 DIAGNOSIS — M810 Age-related osteoporosis without current pathological fracture: Secondary | ICD-10-CM | POA: Diagnosis not present

## 2019-02-21 DIAGNOSIS — J45909 Unspecified asthma, uncomplicated: Secondary | ICD-10-CM | POA: Diagnosis not present

## 2019-02-21 DIAGNOSIS — R69 Illness, unspecified: Secondary | ICD-10-CM | POA: Diagnosis not present

## 2019-02-21 DIAGNOSIS — N184 Chronic kidney disease, stage 4 (severe): Secondary | ICD-10-CM | POA: Diagnosis not present

## 2019-02-21 DIAGNOSIS — J42 Unspecified chronic bronchitis: Secondary | ICD-10-CM | POA: Diagnosis not present

## 2019-02-21 DIAGNOSIS — I130 Hypertensive heart and chronic kidney disease with heart failure and stage 1 through stage 4 chronic kidney disease, or unspecified chronic kidney disease: Secondary | ICD-10-CM | POA: Diagnosis not present

## 2019-02-21 DIAGNOSIS — I5032 Chronic diastolic (congestive) heart failure: Secondary | ICD-10-CM | POA: Diagnosis not present

## 2019-02-21 DIAGNOSIS — I429 Cardiomyopathy, unspecified: Secondary | ICD-10-CM | POA: Diagnosis not present

## 2019-02-21 DIAGNOSIS — E11610 Type 2 diabetes mellitus with diabetic neuropathic arthropathy: Secondary | ICD-10-CM | POA: Diagnosis not present

## 2019-02-21 DIAGNOSIS — E1122 Type 2 diabetes mellitus with diabetic chronic kidney disease: Secondary | ICD-10-CM | POA: Diagnosis not present

## 2019-02-22 ENCOUNTER — Ambulatory Visit (INDEPENDENT_AMBULATORY_CARE_PROVIDER_SITE_OTHER): Payer: Medicare HMO | Admitting: Pulmonary Disease

## 2019-02-22 ENCOUNTER — Other Ambulatory Visit: Payer: Self-pay

## 2019-02-22 ENCOUNTER — Encounter: Payer: Self-pay | Admitting: Pulmonary Disease

## 2019-02-22 ENCOUNTER — Ambulatory Visit: Payer: Medicare HMO | Admitting: Pulmonary Disease

## 2019-02-22 VITALS — BP 118/70 | HR 90 | Temp 97.6°F | Ht 68.5 in | Wt 275.0 lb

## 2019-02-22 DIAGNOSIS — Z9229 Personal history of other drug therapy: Secondary | ICD-10-CM | POA: Diagnosis not present

## 2019-02-22 DIAGNOSIS — R0600 Dyspnea, unspecified: Secondary | ICD-10-CM

## 2019-02-22 DIAGNOSIS — R0609 Other forms of dyspnea: Secondary | ICD-10-CM

## 2019-02-22 LAB — PULMONARY FUNCTION TEST
DL/VA % pred: 101 %
DL/VA: 4.04 ml/min/mmHg/L
DLCO unc % pred: 78 %
DLCO unc: 17.5 ml/min/mmHg
FEF 25-75 Post: 1.71 L/sec
FEF 25-75 Pre: 2.16 L/sec
FEF2575-%Change-Post: -20 %
FEF2575-%Pred-Post: 82 %
FEF2575-%Pred-Pre: 104 %
FEV1-%Change-Post: -4 %
FEV1-%Pred-Post: 68 %
FEV1-%Pred-Pre: 71 %
FEV1-Post: 1.81 L
FEV1-Pre: 1.88 L
FEV1FVC-%Change-Post: -1 %
FEV1FVC-%Pred-Pre: 110 %
FEV6-%Change-Post: -4 %
FEV6-%Pred-Post: 64 %
FEV6-%Pred-Pre: 67 %
FEV6-Post: 2.16 L
FEV6-Pre: 2.26 L
FEV6FVC-%Change-Post: -2 %
FEV6FVC-%Pred-Post: 101 %
FEV6FVC-%Pred-Pre: 104 %
FVC-%Change-Post: -2 %
FVC-%Pred-Post: 63 %
FVC-%Pred-Pre: 64 %
FVC-Post: 2.22 L
FVC-Pre: 2.27 L
Post FEV1/FVC ratio: 82 %
Post FEV6/FVC ratio: 97 %
Pre FEV1/FVC ratio: 83 %
Pre FEV6/FVC Ratio: 100 %

## 2019-02-22 NOTE — Progress Notes (Signed)
Kristine [name]    [id number]    November 11, 1945  Primary Care Physician:Avva, Steva Ready, MD  Referring Physician: Prince [name], Kristine [name],  Kristine [name] [id number]  Chief complaint:   Follow-up for dyspnea, diastolic heart failure  HPI: [name] is a 73 year old with past medical history of atrial fibrillation, diastolic heart failure, diabetes mellitus, Charcot's foot She had been previously evaluated by Dr. Annamaria [name] in 2012 for dyspnea on exertion. This was thought to be secondary to heart failure from atrial fibrillation, diastolic heart failure. She has a history of recurrent pneumonias with remote hemoptysis in the setting of anticoagulation.   Referred back to pulmonary clinic in 2017 for evaluation of amiodarone toxicity She has hard to control atrial fibrillation. She had failed flecainide, tikosyn due to prolonged QT and has been on amiodarone since March of 2017.  However CT with no definite evidence of amiodarone toxicity.  She has been off amiodarone from about 2019.  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 She does not have any smoking history or relevant exposures.  Interim History: She had 2 admissions this year for acute on chronic diastolic heart failure, moderate pulmonary hypertension, chronic kidney disease.  Responded to diuresis.  The last admission was in October 2020  Post discharge she is doing well.  Monitoring her weight.  Dyspnea is stable.  Outpatient Encounter Medications as of 02/22/2019  Medication Sig  . ALPRAZolam (XANAX) 0.25 MG tablet Take 0.125-0.25 mg by mouth 2 (two) times daily as needed for anxiety.   . BD PEN NEEDLE NANO U/F 32G X 4 MM MISC Inject 1 each as directed See admin instructions. Use pen needles with insulin pens daily  . budesonide (PULMICORT) 0.25 MG/2ML nebulizer solution Take 2 mLs (0.25 mg total) by nebulization 2 (two) times daily.  . colchicine 0.6 MG tablet Take 0.5 tablets by mouth 2 (two)  times daily as needed. For gout flare ups  . estradiol (ESTRACE) 0.1 MG/GM vaginal cream Place 1 Applicatorful vaginally as needed (Estrace).   . furosemide (LASIX) 80 MG tablet Take 80 mg by mouth 3 (three) times daily.  . insulin aspart (NOVOLOG) 100 UNIT/ML injection Inject 15 Units into the skin 3 (three) times daily with meals.  . Insulin Degludec (TRESIBA FLEXTOUCH) 200 UNIT/ML SOPN Inject 50 Units into the skin 2 (two) times daily.  Marland Kitchen KLOR-CON M20 20 MEQ tablet Take 60 mEq by mouth 2 (two) times daily.   Marland Kitchen levalbuterol (XOPENEX) 0.63 MG/3ML nebulizer solution Take 3 mLs (0.63 mg total) by nebulization every 8 (eight) hours as needed for up to 30 days for wheezing or shortness of breath.  . levothyroxine (SYNTHROID, LEVOTHROID) 50 MCG tablet Take 50 mcg by mouth daily.   Marland Kitchen LYRICA 50 MG capsule Take 50-150 mg by mouth See admin instructions. Take 50 mg in the morning 50 mg at lunch and up to 147m at bedtime  . metolazone (ZAROXOLYN) 2.5 MG tablet Take 1 tablet by mouth 3 (three) times a week. Mon, Wed, Fri  . metoprolol succinate (TOPROL-XL) 50 MG 24 hr tablet Take 1 tablet (50 mg total) by mouth 2 (two) times daily. Take with or immediately following a meal.  . ULORIC 80 MG TABS Take 1 tablet by mouth daily.  . Vitamin D, Ergocalciferol, (DRISDOL) 50000 UNITS CAPS Take 50,000 Units by mouth every 7 (seven) days. Takes on Thursday  . XARELTO 15 MG TABS tablet Take 15 mg by  mouth daily with supper.    No facility-administered encounter medications on file as of 02/22/2019.    Physical Exam: Blood pressure 124/68, pulse (!) 57, height _0  (1.753 m), weight 263 lb (119.3 kg), SpO2 90 %. Gen:      No acute distress HEENT:  EOMI, sclera anicteric Neck:     No masses; no thyromegaly Lungs:    Clear to auscultation bilaterally; normal respiratory effort CV:         Regular rate and rhythm; no murmurs Abd:      + bowel sounds; soft, non-tender; no palpable masses, no distension Ext:    No  edema; adequate peripheral perfusion Skin:      Warm and dry; no rash Neuro: alert and oriented x 3 Psych: normal mood and affect  Data Reviewed:  Imaging: High res CT 01/23/16  Patchy mild regurgitation, faint groundglass opacities. Nonspecific findings not clearly consistent with amiodarone toxicity.   High-res CT 09/27/2018- No change in mild bibasal scarring/atelectasis.  Mild trapping.  Calcified pulmonary nodules.  I have reviewed the images personally.  PFT 12/05/09- Mild restriction TLC 73%, DLCO 69%, Spirometry WNL w/ resp to BD 6MWT-12/05/09- 100%, 98%, 98% 368 m.  11/19/15 FVC 3.61 (69%), FEV1 2.75 (69%), F/F 76, TLC 82%, DLCO 49% Moderate-severe diffusion defect.  02/22/2019 FVC 2.22 [63%], FEV1 1.81 [68%], F/F 82, DLCO 17.50 [98% Mild restriction with minimal diffusion defect.  Echo 04/04/15 Mild to moderate LVH with LVEF 55-60% and grade 2 diastolic   dysfunction with increased filling pressures. Mild left atrial   enlargement. Mild MAC with mild mitral regurgitation. Sclerotic   aortic valve without stenosis. Mildly dilated RV with normal   contraction. Trivial tricuspid regurgitation, unable to   accurately assess PASP. Trivial pericardial effusion.  Assessment:  Follow-up for dyspnea on exertion, abnormal PFTs. CT and PFTs were reviewed with her today. They did not show any obstruction to suggest either COPD or emphysema. There is an isolated reduction in diffusion capacity that corrects for alveolar volume.  There is a question of amiodarone toxicity but no evidence of this on imaging. I suspect her dyspnea is multifactorial secondary to her obesity, deconditioning and chronic heart failure, A. fib issues.  Do not see any obvious pulmonary etiology for dyspnea  She is on Pulmicort nebs which we can stop. She will use the Xopenex as needed going forward Continue management with cardiology Work on weight loss with diet.  Plan/Recommendations: - DC Pulmicort.  Can  use Xopenex as needed - Continue with diet and exercise                                              Return in 6 month.  Kristine Garfinkel MD [facility] Pager [phone] 02/22/2019, 12:25 PM  CC: Kristine Solian, MD

## 2019-02-22 NOTE — Progress Notes (Signed)
PFT done today. 

## 2019-02-22 NOTE — Patient Instructions (Signed)
I am glad you are doing well with your breathing Continue to monitor your weight daily and take extra dose of the water pill if your weight increases by 5 pounds or more You can stop the Pulmicort nebs and just uses Xopenex nebs as needed Continue to work on weight loss with diet Follow-up in 6 months.

## 2019-02-24 DIAGNOSIS — I130 Hypertensive heart and chronic kidney disease with heart failure and stage 1 through stage 4 chronic kidney disease, or unspecified chronic kidney disease: Secondary | ICD-10-CM | POA: Diagnosis not present

## 2019-02-24 DIAGNOSIS — N184 Chronic kidney disease, stage 4 (severe): Secondary | ICD-10-CM | POA: Diagnosis not present

## 2019-02-24 DIAGNOSIS — I429 Cardiomyopathy, unspecified: Secondary | ICD-10-CM | POA: Diagnosis not present

## 2019-02-24 DIAGNOSIS — J45909 Unspecified asthma, uncomplicated: Secondary | ICD-10-CM | POA: Diagnosis not present

## 2019-02-24 DIAGNOSIS — M810 Age-related osteoporosis without current pathological fracture: Secondary | ICD-10-CM | POA: Diagnosis not present

## 2019-02-24 DIAGNOSIS — E11610 Type 2 diabetes mellitus with diabetic neuropathic arthropathy: Secondary | ICD-10-CM | POA: Diagnosis not present

## 2019-02-24 DIAGNOSIS — J42 Unspecified chronic bronchitis: Secondary | ICD-10-CM | POA: Diagnosis not present

## 2019-02-24 DIAGNOSIS — R69 Illness, unspecified: Secondary | ICD-10-CM | POA: Diagnosis not present

## 2019-02-24 DIAGNOSIS — E1122 Type 2 diabetes mellitus with diabetic chronic kidney disease: Secondary | ICD-10-CM | POA: Diagnosis not present

## 2019-02-24 DIAGNOSIS — I5032 Chronic diastolic (congestive) heart failure: Secondary | ICD-10-CM | POA: Diagnosis not present

## 2019-02-25 DIAGNOSIS — Z794 Long term (current) use of insulin: Secondary | ICD-10-CM | POA: Diagnosis not present

## 2019-02-25 DIAGNOSIS — E108 Type 1 diabetes mellitus with unspecified complications: Secondary | ICD-10-CM | POA: Diagnosis not present

## 2019-02-27 DIAGNOSIS — I5032 Chronic diastolic (congestive) heart failure: Secondary | ICD-10-CM | POA: Diagnosis not present

## 2019-02-27 DIAGNOSIS — I130 Hypertensive heart and chronic kidney disease with heart failure and stage 1 through stage 4 chronic kidney disease, or unspecified chronic kidney disease: Secondary | ICD-10-CM | POA: Diagnosis not present

## 2019-02-27 DIAGNOSIS — E1122 Type 2 diabetes mellitus with diabetic chronic kidney disease: Secondary | ICD-10-CM | POA: Diagnosis not present

## 2019-02-27 DIAGNOSIS — R69 Illness, unspecified: Secondary | ICD-10-CM | POA: Diagnosis not present

## 2019-02-27 DIAGNOSIS — I429 Cardiomyopathy, unspecified: Secondary | ICD-10-CM | POA: Diagnosis not present

## 2019-02-27 DIAGNOSIS — M810 Age-related osteoporosis without current pathological fracture: Secondary | ICD-10-CM | POA: Diagnosis not present

## 2019-02-27 DIAGNOSIS — J45909 Unspecified asthma, uncomplicated: Secondary | ICD-10-CM | POA: Diagnosis not present

## 2019-02-27 DIAGNOSIS — E11610 Type 2 diabetes mellitus with diabetic neuropathic arthropathy: Secondary | ICD-10-CM | POA: Diagnosis not present

## 2019-02-27 DIAGNOSIS — N184 Chronic kidney disease, stage 4 (severe): Secondary | ICD-10-CM | POA: Diagnosis not present

## 2019-02-27 DIAGNOSIS — J42 Unspecified chronic bronchitis: Secondary | ICD-10-CM | POA: Diagnosis not present

## 2019-03-01 DIAGNOSIS — R69 Illness, unspecified: Secondary | ICD-10-CM | POA: Diagnosis not present

## 2019-03-01 DIAGNOSIS — J45909 Unspecified asthma, uncomplicated: Secondary | ICD-10-CM | POA: Diagnosis not present

## 2019-03-01 DIAGNOSIS — N184 Chronic kidney disease, stage 4 (severe): Secondary | ICD-10-CM | POA: Diagnosis not present

## 2019-03-01 DIAGNOSIS — M810 Age-related osteoporosis without current pathological fracture: Secondary | ICD-10-CM | POA: Diagnosis not present

## 2019-03-01 DIAGNOSIS — E11610 Type 2 diabetes mellitus with diabetic neuropathic arthropathy: Secondary | ICD-10-CM | POA: Diagnosis not present

## 2019-03-01 DIAGNOSIS — E1122 Type 2 diabetes mellitus with diabetic chronic kidney disease: Secondary | ICD-10-CM | POA: Diagnosis not present

## 2019-03-01 DIAGNOSIS — I5032 Chronic diastolic (congestive) heart failure: Secondary | ICD-10-CM | POA: Diagnosis not present

## 2019-03-01 DIAGNOSIS — I130 Hypertensive heart and chronic kidney disease with heart failure and stage 1 through stage 4 chronic kidney disease, or unspecified chronic kidney disease: Secondary | ICD-10-CM | POA: Diagnosis not present

## 2019-03-01 DIAGNOSIS — I429 Cardiomyopathy, unspecified: Secondary | ICD-10-CM | POA: Diagnosis not present

## 2019-03-01 DIAGNOSIS — J42 Unspecified chronic bronchitis: Secondary | ICD-10-CM | POA: Diagnosis not present

## 2019-03-02 DIAGNOSIS — I5032 Chronic diastolic (congestive) heart failure: Secondary | ICD-10-CM | POA: Diagnosis not present

## 2019-03-02 DIAGNOSIS — R69 Illness, unspecified: Secondary | ICD-10-CM | POA: Diagnosis not present

## 2019-03-02 DIAGNOSIS — I429 Cardiomyopathy, unspecified: Secondary | ICD-10-CM | POA: Diagnosis not present

## 2019-03-02 DIAGNOSIS — I130 Hypertensive heart and chronic kidney disease with heart failure and stage 1 through stage 4 chronic kidney disease, or unspecified chronic kidney disease: Secondary | ICD-10-CM | POA: Diagnosis not present

## 2019-03-02 DIAGNOSIS — E11610 Type 2 diabetes mellitus with diabetic neuropathic arthropathy: Secondary | ICD-10-CM | POA: Diagnosis not present

## 2019-03-02 DIAGNOSIS — J42 Unspecified chronic bronchitis: Secondary | ICD-10-CM | POA: Diagnosis not present

## 2019-03-02 DIAGNOSIS — M810 Age-related osteoporosis without current pathological fracture: Secondary | ICD-10-CM | POA: Diagnosis not present

## 2019-03-02 DIAGNOSIS — E1122 Type 2 diabetes mellitus with diabetic chronic kidney disease: Secondary | ICD-10-CM | POA: Diagnosis not present

## 2019-03-02 DIAGNOSIS — J45909 Unspecified asthma, uncomplicated: Secondary | ICD-10-CM | POA: Diagnosis not present

## 2019-03-02 DIAGNOSIS — N184 Chronic kidney disease, stage 4 (severe): Secondary | ICD-10-CM | POA: Diagnosis not present

## 2019-03-08 DIAGNOSIS — E1122 Type 2 diabetes mellitus with diabetic chronic kidney disease: Secondary | ICD-10-CM | POA: Diagnosis not present

## 2019-03-08 DIAGNOSIS — J45909 Unspecified asthma, uncomplicated: Secondary | ICD-10-CM | POA: Diagnosis not present

## 2019-03-08 DIAGNOSIS — R69 Illness, unspecified: Secondary | ICD-10-CM | POA: Diagnosis not present

## 2019-03-08 DIAGNOSIS — N184 Chronic kidney disease, stage 4 (severe): Secondary | ICD-10-CM | POA: Diagnosis not present

## 2019-03-08 DIAGNOSIS — I5032 Chronic diastolic (congestive) heart failure: Secondary | ICD-10-CM | POA: Diagnosis not present

## 2019-03-08 DIAGNOSIS — M810 Age-related osteoporosis without current pathological fracture: Secondary | ICD-10-CM | POA: Diagnosis not present

## 2019-03-08 DIAGNOSIS — I429 Cardiomyopathy, unspecified: Secondary | ICD-10-CM | POA: Diagnosis not present

## 2019-03-08 DIAGNOSIS — J42 Unspecified chronic bronchitis: Secondary | ICD-10-CM | POA: Diagnosis not present

## 2019-03-08 DIAGNOSIS — I130 Hypertensive heart and chronic kidney disease with heart failure and stage 1 through stage 4 chronic kidney disease, or unspecified chronic kidney disease: Secondary | ICD-10-CM | POA: Diagnosis not present

## 2019-03-08 DIAGNOSIS — E11610 Type 2 diabetes mellitus with diabetic neuropathic arthropathy: Secondary | ICD-10-CM | POA: Diagnosis not present

## 2019-03-09 DIAGNOSIS — Q385 Congenital malformations of palate, not elsewhere classified: Secondary | ICD-10-CM | POA: Diagnosis not present

## 2019-03-09 DIAGNOSIS — Z961 Presence of intraocular lens: Secondary | ICD-10-CM | POA: Diagnosis not present

## 2019-03-09 DIAGNOSIS — E113493 Type 2 diabetes mellitus with severe nonproliferative diabetic retinopathy without macular edema, bilateral: Secondary | ICD-10-CM | POA: Diagnosis not present

## 2019-03-09 DIAGNOSIS — H35041 Retinal micro-aneurysms, unspecified, right eye: Secondary | ICD-10-CM | POA: Diagnosis not present

## 2019-03-09 DIAGNOSIS — H35042 Retinal micro-aneurysms, unspecified, left eye: Secondary | ICD-10-CM | POA: Diagnosis not present

## 2019-03-10 DIAGNOSIS — N184 Chronic kidney disease, stage 4 (severe): Secondary | ICD-10-CM | POA: Diagnosis not present

## 2019-03-10 DIAGNOSIS — E876 Hypokalemia: Secondary | ICD-10-CM | POA: Diagnosis not present

## 2019-03-10 DIAGNOSIS — E8770 Fluid overload, unspecified: Secondary | ICD-10-CM | POA: Diagnosis not present

## 2019-03-10 DIAGNOSIS — I129 Hypertensive chronic kidney disease with stage 1 through stage 4 chronic kidney disease, or unspecified chronic kidney disease: Secondary | ICD-10-CM | POA: Diagnosis not present

## 2019-03-14 DIAGNOSIS — Z20828 Contact with and (suspected) exposure to other viral communicable diseases: Secondary | ICD-10-CM | POA: Diagnosis not present

## 2019-03-20 DIAGNOSIS — M25532 Pain in left wrist: Secondary | ICD-10-CM | POA: Diagnosis not present

## 2019-03-20 DIAGNOSIS — N6459 Other signs and symptoms in breast: Secondary | ICD-10-CM | POA: Diagnosis not present

## 2019-03-20 DIAGNOSIS — M25561 Pain in right knee: Secondary | ICD-10-CM | POA: Diagnosis not present

## 2019-03-20 DIAGNOSIS — N61 Mastitis without abscess: Secondary | ICD-10-CM | POA: Diagnosis not present

## 2019-03-20 DIAGNOSIS — J029 Acute pharyngitis, unspecified: Secondary | ICD-10-CM | POA: Diagnosis not present

## 2019-03-20 DIAGNOSIS — L0889 Other specified local infections of the skin and subcutaneous tissue: Secondary | ICD-10-CM | POA: Diagnosis not present

## 2019-03-20 DIAGNOSIS — I509 Heart failure, unspecified: Secondary | ICD-10-CM | POA: Diagnosis not present

## 2019-03-20 DIAGNOSIS — I130 Hypertensive heart and chronic kidney disease with heart failure and stage 1 through stage 4 chronic kidney disease, or unspecified chronic kidney disease: Secondary | ICD-10-CM | POA: Diagnosis not present

## 2019-03-20 DIAGNOSIS — R0781 Pleurodynia: Secondary | ICD-10-CM | POA: Diagnosis not present

## 2019-03-20 DIAGNOSIS — N184 Chronic kidney disease, stage 4 (severe): Secondary | ICD-10-CM | POA: Diagnosis not present

## 2019-03-20 DIAGNOSIS — Q385 Congenital malformations of palate, not elsewhere classified: Secondary | ICD-10-CM | POA: Diagnosis not present

## 2019-03-22 DIAGNOSIS — Z7901 Long term (current) use of anticoagulants: Secondary | ICD-10-CM | POA: Diagnosis not present

## 2019-03-22 DIAGNOSIS — K1379 Other lesions of oral mucosa: Secondary | ICD-10-CM | POA: Diagnosis not present

## 2019-03-22 DIAGNOSIS — I4891 Unspecified atrial fibrillation: Secondary | ICD-10-CM | POA: Diagnosis not present

## 2019-03-27 DIAGNOSIS — E108 Type 1 diabetes mellitus with unspecified complications: Secondary | ICD-10-CM | POA: Diagnosis not present

## 2019-03-27 DIAGNOSIS — Z794 Long term (current) use of insulin: Secondary | ICD-10-CM | POA: Diagnosis not present

## 2019-03-30 ENCOUNTER — Telehealth: Payer: Self-pay | Admitting: Cardiovascular Disease

## 2019-03-30 ENCOUNTER — Telehealth: Payer: Self-pay

## 2019-03-30 DIAGNOSIS — R009 Unspecified abnormalities of heart beat: Secondary | ICD-10-CM

## 2019-03-30 DIAGNOSIS — I4819 Other persistent atrial fibrillation: Secondary | ICD-10-CM

## 2019-03-30 NOTE — Telephone Encounter (Signed)
    [facility] Pre-operative Risk Assessment    Request for surgical clearance:  1. What type of surgery is being performed? LESION OF SOFT PALATE   2. When is this surgery scheduled? 04/04/19   3. What type of clearance is required (medical clearance vs. Pharmacy clearance to hold med vs. Both)? BOTH  4. Are there any medications that need to be held prior to surgery and how long? XARELTO; INSTRUCTED ON HOW LONG PT CAN HOLD PRIOR TO SCHEDULE.   5. Practice name and name of physician performing surgery? [facility]; DR. MARCELLINO  6. What is your office phone number [phone]    7.   What is your office fax number [phone]  8.   Anesthesia type (None, local, MAC, general) ? NONE LISTED   Jacinta Shoe 03/30/2019, 2:32 PM  _________________________________________________________________   (provider comments below)

## 2019-03-30 NOTE — Telephone Encounter (Signed)
New Message  Patient is calling in to make sure that Dr. Acie Fredrickson is aware of the surgery that she has scheduled on 04/04/19 with [facility]. Patient states that Dr. Johnsie Cancel signed her clearance form. Please give patient a call to discuss.

## 2019-03-30 NOTE — Telephone Encounter (Signed)
Spoke with pt who is agreeable to to see Pecolia Ades on 03/31/19 at 9 am virtually. Pt is aware to have vitals prior to appointment. Pt verbalized understanding and thanked me for the call. I will fax over recommendations to requesting surgeon office along with scheduled appointment.      Virtual Visit Pre-Appointment Phone Call  "(Name), I am calling you today to discuss your upcoming appointment. We are currently trying to limit exposure to the virus that causes COVID-19 by seeing patients at home rather than in the office."  1. "What is the BEST phone number to call the day of the visit?" - include this in appointment notes  2. "Do you have or have access to (through a family member/friend) a smartphone with video capability that we can use for your visit?" a. If yes - list this number in appt notes as "cell" (if different from BEST phone #) and list the appointment type as a VIDEO visit in appointment notes b. If no - list the appointment type as a PHONE visit in appointment notes  3. Confirm consent - "In the setting of the current Covid19 crisis, you are scheduled for a (phone or video) visit with your provider on (date) at (time).  Just as we do with many in-office visits, in order for you to participate in this visit, we must obtain consent.  If you'd like, I can send this to your mychart (if signed up) or email for you to review.  Otherwise, I can obtain your verbal consent now.  All virtual visits are billed to your insurance company just like a normal visit would be.  By agreeing to a virtual visit, we'd like you to understand that the technology does not allow for your provider to perform an examination, and thus may limit your provider's ability to fully assess your condition. If your provider identifies any concerns that need to be evaluated in person, we will make arrangements to do so.  Finally, though the technology is pretty good, we cannot assure that it will always work on either  your or our end, and in the setting of a video visit, we may have to convert it to a phone-only visit.  In either situation, we cannot ensure that we have a secure connection.  Are you willing to proceed?" STAFF: Did the patient verbally acknowledge consent to telehealth visit? Document YES/NO here: yes  4. Advise patient to be prepared - "Two hours prior to your appointment, go ahead and check your blood pressure, pulse, oxygen saturation, and your weight (if you have the equipment to check those) and write them all down. When your visit starts, your provider will ask you for this information. If you have an Apple Watch or Kardia device, please plan to have heart rate information ready on the day of your appointment. Please have a pen and paper handy nearby the day of the visit as well."  5. Give patient instructions for MyChart download to smartphone OR Doximity/Doxy.me as below if video visit (depending on what platform provider is using)  6. Inform patient they will receive a phone call 15 minutes prior to their appointment time (may be from unknown caller ID) so they should be prepared to answer    TELEPHONE CALL NOTE  Kristine [name] has been deemed a candidate for a follow-up tele-health visit to limit community exposure during the Covid-19 pandemic. I spoke with the patient via phone to ensure availability of phone/video source, confirm preferred email &  phone number, and discuss instructions and expectations.  I reminded Kristine [name] to be prepared with any vital sign and/or heart rhythm information that could potentially be obtained via home monitoring, at the time of her visit. I reminded Kristine [name] to expect a phone call prior to her visit.  Jacinta Shoe, [place] 03/30/2019 4:50 PM   INSTRUCTIONS FOR DOWNLOADING THE MYCHART APP TO SMARTPHONE  - The patient must first make sure to have activated MyChart and know their login information - If Apple, go to [employer]  and type in MyChart in the search bar and download the app. If Android, ask patient to go to [employer] and type in [place] in the search bar and download the app. The app is free but as with any other app downloads, their phone may require them to verify saved payment information or Apple/Android password.  - The patient will need to then log into the app with their MyChart username and password, and select [facility] as their healthcare provider to link the account. When it is time for your visit, go to the MyChart app, find appointments, and click Begin Video Visit. Be sure to Select Allow for your device to access the Microphone and Camera for your visit. You will then be connected, and your provider will be with you shortly.  **If they have any issues connecting, or need assistance please contact MyChart service desk [phone])**  **If using a computer, in order to ensure the best quality for their visit they will need to use either of the following Internet Browsers: [employer], or Google Chrome**  IF USING DOXIMITY or DOXY.ME - The patient will receive a link just prior to their visit by text.     FULL LENGTH CONSENT FOR TELE-HEALTH VISIT   I hereby voluntarily request, consent and authorize [facility] and its employed or contracted physicians, physician assistants, nurse practitioners or other licensed health care professionals (the Practitioner), to provide me with telemedicine health care services (the "Services") as deemed necessary by the treating Practitioner. I acknowledge and consent to receive the Services by the Practitioner via telemedicine. I understand that the telemedicine visit will involve communicating with the Practitioner through live audiovisual communication technology and the disclosure of certain medical information by electronic transmission. I acknowledge that I have been given the opportunity to request an in-person assessment or other  available alternative prior to the telemedicine visit and am voluntarily participating in the telemedicine visit.  I understand that I have the right to withhold or withdraw my consent to the use of telemedicine in the course of my care at any time, without affecting my right to future care or treatment, and that the Practitioner or I may terminate the telemedicine visit at any time. I understand that I have the right to inspect all information obtained and/or recorded in the course of the telemedicine visit and may receive copies of available information for a reasonable fee.  I understand that some of the potential risks of receiving the Services via telemedicine include:  Marland Kitchen Delay or interruption in medical evaluation due to technological equipment failure or disruption; . Information transmitted may not be sufficient (e.g. poor resolution of images) to allow for appropriate medical decision making by the Practitioner; and/or  . In rare instances, security protocols could fail, causing a breach of personal health information.  Furthermore, I acknowledge that it is my responsibility to provide information about my medical history, conditions and care  that is complete and accurate to the best of my ability. I acknowledge that Practitioner's advice, recommendations, and/or decision may be based on factors not within their control, such as incomplete or inaccurate data provided by me or distortions of diagnostic images or specimens that may result from electronic transmissions. I understand that the practice of medicine is not an exact science and that Practitioner makes no warranties or guarantees regarding treatment outcomes. I acknowledge that I will receive a copy of this consent concurrently upon execution via email to the email address I last provided but may also request a printed copy by calling the office of [facility].    I understand that my insurance will be billed for this visit.   I have  read or had this consent read to me. . I understand the contents of this consent, which adequately explains the benefits and risks of the Services being provided via telemedicine.  . I have been provided ample opportunity to ask questions regarding this consent and the Services and have had my questions answered to my satisfaction. . I give my informed consent for the services to be provided through the use of telemedicine in my medical care  By participating in this telemedicine visit I agree to the above.

## 2019-03-30 NOTE — Telephone Encounter (Signed)
Spoke with pt who is agreeable to to see Pecolia Ades on 03/31/19 at 9 am virtually. Pt is aware to have vitals prior to appointment. Pt verbalized understanding and thanked me for the call. I will fax over recommendations to requesting surgeon office along with scheduled appointment.

## 2019-03-30 NOTE — Telephone Encounter (Signed)
Pt takes Xarelto for afib with CHADS2VASc score of 6 (age, sex, CHF, HTN, DM, CAD). SCr 2.01, CrCl 49.8 using actual body weight, 35.8 using adjusted body weight. Pt on appropriately reduced dose of Xarelto. Ok to hold Xarelto for 1-2 days prior to procedure.

## 2019-03-30 NOTE — Telephone Encounter (Signed)
   Primary Cardiologist:Philip Nahser, MD  Chart reviewed as part of pre-operative protocol coverage. Ms. Kristine [name] was hospitalized 12/2018 for a CHF exacerbation and has failed to follow-up since that time. In order to better assess her preoperative cardiovascular risk, feel she should have a follow-up visit.  Per pharmacy recommendations, patient can hold xarelto 1-2 days prior to her procedure.   Pre-op covering staff: - Please schedule an in-office or virtual appointment and call patient to inform them. - Please contact requesting surgeon's office via preferred method (i.e, phone, fax) to inform them of need for appointment prior to surgery.   Abigail Butts, PA-C  03/30/2019, 4:12 PM

## 2019-03-30 NOTE — Telephone Encounter (Signed)
Returned call to patient to discuss her question about surgical clearance. The plan of care has been discussed with patient by Jacinta Shoe, CMA and she has been scheduled for a virtual visit with Pecolia Ades, NP for tomorrow, 1/8.

## 2019-03-31 ENCOUNTER — Encounter: Payer: Self-pay | Admitting: Cardiology

## 2019-03-31 ENCOUNTER — Other Ambulatory Visit: Payer: Self-pay

## 2019-03-31 ENCOUNTER — Telehealth (INDEPENDENT_AMBULATORY_CARE_PROVIDER_SITE_OTHER): Payer: Medicare HMO | Admitting: Cardiology

## 2019-03-31 VITALS — BP 126/88 | HR 91 | Temp 97.0°F | Ht 68.5 in | Wt 273.0 lb

## 2019-03-31 DIAGNOSIS — Z01810 Encounter for preprocedural cardiovascular examination: Secondary | ICD-10-CM

## 2019-03-31 DIAGNOSIS — N184 Chronic kidney disease, stage 4 (severe): Secondary | ICD-10-CM

## 2019-03-31 DIAGNOSIS — Z794 Long term (current) use of insulin: Secondary | ICD-10-CM

## 2019-03-31 DIAGNOSIS — E1142 Type 2 diabetes mellitus with diabetic polyneuropathy: Secondary | ICD-10-CM

## 2019-03-31 DIAGNOSIS — I10 Essential (primary) hypertension: Secondary | ICD-10-CM

## 2019-03-31 DIAGNOSIS — I5032 Chronic diastolic (congestive) heart failure: Secondary | ICD-10-CM | POA: Diagnosis not present

## 2019-03-31 DIAGNOSIS — I272 Pulmonary hypertension, unspecified: Secondary | ICD-10-CM

## 2019-03-31 DIAGNOSIS — I4819 Other persistent atrial fibrillation: Secondary | ICD-10-CM

## 2019-03-31 NOTE — Patient Instructions (Signed)
Do the following things EVERY DAY:   1. Weigh yourself EVERY morning after you go to the bathroom but before you eat or drink anything. Write this number down in a weight log/diary. If you gain 3 pounds overnight or 5 pounds in a week, call the office.   2. Take your medicines as prescribed. If you have concerns about your medications, please call us before you stop taking them.    3. Eat low salt foods--Limit salt (sodium) to 2000 mg per day. This will help prevent your body from holding onto fluid. Read food labels as many processed foods have a lot of sodium, especially canned goods and prepackaged meats. If you would like some assistance choosing low sodium foods, we would be happy to set you up with a nutritionist.   4. Stay as active as you can everyday. Staying active will give you more energy and make your muscles stronger. Start with 5 minutes at a time and work your way up to 30 minutes a day. Break up your activities--do some in the morning and some in the afternoon. Start with 3 days per week and work your way up to 5 days as you can.  If you have chest pain, feel short of breath, dizzy, or lightheaded, STOP. If you don't feel better after a short rest, call 911. If you do feel better, call the office to let us know you have symptoms with exercise.   5. Limit all fluids for the day to less than 2 liters. Fluid includes all drinks, coffee, juice, ice chips, soup, jello, and all other liquids.     Heart Failure Action Plan A heart failure action plan helps you understand what to do when you have symptoms of heart failure. Follow the plan that was created by you and your health care provider. Review your plan each time you visit your health care provider. Red zone These signs and symptoms mean you should get medical help right away:  You have trouble breathing when resting.  You have a dry cough that is getting worse.  You have swelling or pain in your legs or abdomen that is getting  worse.  You suddenly gain more than 2-3 lb (0.9-1.4 kg) in a day, or more than 5 lb (2.3 kg) in one week. This amount may be more or less depending on your condition.  You have trouble staying awake or you feel confused.  You have chest pain.  You do not have an appetite.  You pass out. If you experience any of these symptoms:  Call your local emergency services (911 in the [place] right away or seek help at the emergency department of the nearest hospital. Yellow zone These signs and symptoms mean your condition may be getting worse and you should make some changes:  You have trouble breathing when you are active or you need to sleep with extra pillows.  You have swelling in your legs or abdomen.  You gain 2-3 lb (0.9-1.4 kg) in one day, or 5 lb (2.3 kg) in one week. This amount may be more or less depending on your condition.  You get tired easily.  You have trouble sleeping.  You have a dry cough. If you experience any of these symptoms:  Contact your health care provider within the next day.  Your health care provider may adjust your medicines. Green zone These signs mean you are doing well and can continue what you are doing:  You do not have shortness  of breath.  You have very little swelling or no new swelling.  Your weight is stable (no gain or loss).  You have a normal activity level.  You do not have chest pain or any other new symptoms. Follow these instructions at home:  Take over-the-counter and prescription medicines only as told by your health care provider.  Weigh yourself daily. Your target weight is __________ lb (__________ kg). ? Call your health care provider if you gain more than __________ lb (__________ kg) in a day, or more than __________ lb (__________ kg) in one week.  Eat a heart-healthy diet. Work with a diet and nutrition specialist (dietitian) to create an eating plan that is best for you.  Keep all follow-up visits as told by your  health care provider. This is important. Where to find more information  American Heart Association: www.heart.org Summary  Follow the action plan that was created by you and your health care provider.  Get help right away if you have any symptoms in the Red zone. This information is not intended to replace advice given to you by your health care provider. Make sure you discuss any questions you have with your health care provider. Document Revised: 02/19/2017 Document Reviewed: 04/18/2016 Elsevier Patient Education  2020 [employer].

## 2019-03-31 NOTE — Progress Notes (Signed)
Virtual Visit via Telephone Note   This visit type was conducted due to national recommendations for restrictions regarding the COVID-19 Pandemic (e.g. social distancing) in an effort to limit this patient's exposure and mitigate transmission in our community.  Due to her co-morbid illnesses, this patient is at least at moderate risk for complications without adequate follow up.  This format is felt to be most appropriate for this patient at this time.  The patient did not have access to video technology/had technical difficulties with video requiring transitioning to audio format only (telephone).  All issues noted in this document were discussed and addressed.  No physical exam could be performed with this format.  Please refer to the patient's chart for her  consent to telehealth for [facility].   Date:  03/31/2019   ID:  Kristine [name], DOB 14-Jan-1946, MRN [id number]  Patient Location: Home Provider Location: Office  PCP:  Prince Solian, MD  Cardiologist:  Mertie Moores, MD  Electrophysiologist:  None   Evaluation Performed:  Follow-Up Visit  Chief Complaint: Surgical clearance  History of Present Illness:    Kristine [name] is a 74 y.o. female with past medical history significant for chronic diastolic heart failure, pulmonary hypertension, persistent atrial fibrillation, CKD, hyperlipidemia, hypertension and diabetes who is being seen today for cardiac clearance for procedure on soft palate lesion.  She did not tolerate Tikosyn in the past due to prolonged QT.  She was evaluated for possible amiodarone toxicity in 2017 but there was no evidence on imaging.  She continues to follow with pulmonology who finds no pulmonary etiology for her dyspnea, has been related to her chronic diastolic heart failure and A. fib issues.  The patient has been hospitalized twice this year for acute on chronic diastolic heart failure.  The last hospitalization was in October and she has  not followed up since.  She has been seen by pulmonology who noted that she was doing well.    Echocardiogram in 05/2018 showed EF 50-55%, right ventricular volume and pressure overload, moderate mitral regurgitation.  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  Today by phone call, she reports that Daily wts have been stable at ~273. Fluctuates only 1-2 pounds. She has very little edema, not like she used to. Now only a sock line when she wears tight socks. She sleeps on a flat bed with only 1 pillow.   She says that she has cut down her sodium intake but knows she still needs to cut down on bread. Still has occ hot dog and deli meat. She has gone to a nutritionist and is reading labels. She has thrown her salt shaker away.   Patient was seen by nephrology in December and notes that labs were done and reportedly were stable.  I will attempt to get office note and labs.  She was getting home PT after Charcot foot surgery, finished last week. She walks a circle around the inside of her house several times per day. She uses a walker. Occ goes out in the car. She says that her breathing is good with the activity that she can do. She gets tired in her arms after exercise due to carrying her body on the walker. She loves to sing and does so without difficulty.   Her lasix is ordered 80 mg TID, however she often consolidates 2 doses into 1. Metolazone on MWF.   She had a rapid heart beat on the day of the Surge on the capitol  but better now.   The patient does not have symptoms concerning for COVID-19 infection (fever, chills, cough, or new shortness of breath).    Past Medical History:  Diagnosis Date  . Allergy   . Anxiety   . Arthritis   . Asthma   . Atrial fibrillation ([place])   . Cataract   . CHF (congestive heart failure) ([place])   . Chronic bronchitis ([place])   . Chronic kidney disease   . Clotting disorder ([place])   . Depression   . GERD (gastroesophageal reflux disease)   . High cholesterol   .  History of hiatal hernia   . HTN (hypertension)   . Mild aortic sclerosis   . Morbid obesity ([place])   . Neuromuscular disorder ([place])   . Osteoporosis   . Persistent atrial fibrillation ([place])   . Scoliosis   . Spinal stenosis   . Thyroid disease   . Type II diabetes mellitus ([place])   . Vitamin D deficiency    Past Surgical History:  Procedure Laterality Date  . BLADDER SUSPENSION  1980s  . CARDIAC CATHETERIZATION  11/16/09   SMOOTH AND NORMAL  . CARDIOVERSION N/A 01/07/2015   Procedure: CARDIOVERSION;  Surgeon: Skeet Latch, MD;  Location: [facility] ENDOSCOPY;  Service: Cardiovascular;  Laterality: N/A;  . CARDIOVERSION N/A 03/11/2015   Procedure: CARDIOVERSION;  Surgeon: Skeet Latch, MD;  Location: [facility];  Service: Cardiovascular;  Laterality: N/A;  . CARPAL TUNNEL RELEASE Left 1970s  . CATARACT EXTRACTION W/ INTRAOCULAR LENS  IMPLANT, BILATERAL Bilateral ~ 2010  . COLONOSCOPY  08/14/08  . FINGER FRACTURE SURGERY Right 1970s   "ring finger"  . FRACTURE SURGERY    . LAPAROSCOPIC CHOLECYSTECTOMY  1980s  . TUBAL LIGATION    . VAGINAL HYSTERECTOMY  1980     Current Meds  Medication Sig  . ALPRAZolam (XANAX) 0.25 MG tablet Take 0.125-0.25 mg by mouth 2 (two) times daily as needed for anxiety.   . BD PEN NEEDLE NANO U/F 32G X 4 MM MISC Inject 1 each as directed See admin instructions. Use pen needles with insulin pens daily  . colchicine 0.6 MG tablet Take 0.5 tablets by mouth 2 (two) times daily as needed. For gout flare ups  . estradiol (ESTRACE) 0.1 MG/GM vaginal cream Place 1 Applicatorful vaginally as needed (Estrace).   . furosemide (LASIX) 80 MG tablet Take 2 tablets by mouth 2 (two) times daily.  . insulin aspart (NOVOLOG) 100 UNIT/ML injection Inject 30 Units into the skin 4 (four) times daily as needed. Use on a sliding scale  . Insulin Degludec (TRESIBA FLEXTOUCH) 200 UNIT/ML SOPN Inject 264 Units into the skin as directed. 100 units in the morning, 64 units at  lunch, 100 units at night  . KLOR-CON M20 20 MEQ tablet Take 60 mEq by mouth 2 (two) times daily.   Marland Kitchen levothyroxine (SYNTHROID, LEVOTHROID) 50 MCG tablet Take 50 mcg by mouth daily.   Marland Kitchen LYRICA 50 MG capsule Take 50-150 mg by mouth See admin instructions. Take 50 mg in the morning 50 mg at lunch and up to 150mg  at bedtime  . metolazone (ZAROXOLYN) 2.5 MG tablet Take 1 tablet by mouth 3 (three) times a week. Mon, Wed, Fri  . metoprolol succinate (TOPROL-XL) 50 MG 24 hr tablet Take 1 tablet (50 mg total) by mouth 2 (two) times daily. Take with or immediately following a meal.  . ULORIC 80 MG TABS Take 1 tablet by mouth daily.  . Vitamin D, Ergocalciferol, (DRISDOL) 50000  UNITS CAPS Take 50,000 Units by mouth every 7 (seven) days. Takes on Thursday  . XARELTO 15 MG TABS tablet Take 15 mg by mouth daily with supper.   . [DISCONTINUED] furosemide (LASIX) 80 MG tablet Take 80 mg by mouth 3 (three) times daily.  . [DISCONTINUED] insulin aspart (NOVOLOG) 100 UNIT/ML injection Inject 15 Units into the skin 3 (three) times daily with meals.     Allergies:   Albuterol, Epinephrine, Penicillins, Torsemide, Bee venom, and Ivp dye [iodinated diagnostic agents]   Social History   Tobacco Use  . Smoking status: Never Smoker  . Smokeless tobacco: Never Used  Substance Use Topics  . Alcohol use: No  . Drug use: No     Family Hx: The patient's family history includes Atrial fibrillation in her father; CAD in her brother and sister; Congestive Heart Failure in her mother; Heart failure in her mother; Hypertension in her brother, father, mother, sister, and son; Stroke in her father. There is no history of Colon cancer, Esophageal cancer, Stomach cancer, or Rectal cancer.  ROS:   Please see the history of present illness.    All other systems reviewed and are negative.   Prior CV studies:   The following studies were reviewed today:  Echocardiogram 06/04/2018 IMPRESSIONS  1. The left ventricle has low  normal systolic function, with an ejection fraction of 50-55%. The cavity size was normal. Left ventricular diastolic function could not be evaluated secondary to atrial fibrillation. There is right ventricular volume and  pressure overload.  2. The right ventricle has severely reduced systolic function. The cavity was moderately enlarged. There is mildly increased right ventricular wall thickness.  3. The mitral valve is grossly normal. Mild thickening of the mitral valve leaflet. There is mild mitral annular calcification present. Mitral valve regurgitation is moderate by color flow Doppler.  4. Mildly thickened tricuspid valve leaflets.  5. The tricuspid valve is grossly normal. Tricuspid valve regurgitation is moderate.  6. The aortic valve is tricuspid. Moderate annular calcification.  7. The aortic root is normal in size and structure.  8. Right atrial size was mildly dilated.  9. Negative saline bubble study for PFO.  TTE: 12/23/2017  Study Conclusions  - Left ventricle: The cavity size was normal. There was mild concentric hypertrophy. Systolic function was mildly reduced. The estimated ejection fraction was in the range of 45% to 50%. Diffuse hypokinesis. - Aortic valve: Transvalvular velocity was within the normal range. There was no stenosis. There was no regurgitation. - Mitral valve: Transvalvular velocity was within the normal range. There was no evidence for stenosis. There was trivial regurgitation. - Right ventricle: The cavity size was normal. Wall thickness was normal. Systolic function was normal. - Right atrium: The atrium was moderately dilated. - Atrial septum: No defect or patent foramen ovale was identified by color flow Doppler. - Tricuspid valve: There was trivial regurgitation. - Pulmonary arteries: Systolic pressure was mildly increased. PA peak pressure: 43 mm Hg (S). - Pericardium, extracardiac: A trivial pericardial effusion  was identified.  Labs/Other Tests and Data Reviewed:    EKG:  An ECG dated 12/24/2018 was personally reviewed today and demonstrated:  Atrial fibrillation  Recent Labs: 01/02/2019: Hemoglobin 13.9; Platelets 121 01/03/2019: B Natriuretic Peptide 243.1 01/04/2019: Magnesium 2.4 01/05/2019: BUN 50; Creatinine, Ser 2.01; Potassium 3.4; Sodium 141   Recent Lipid Panel Lab Results  Component Value Date/Time   CHOL 123 06/04/2018 05:35 AM   CHOL 180 08/06/2017 01:54 PM   TRIG 105  06/04/2018 05:35 AM   HDL 25 (L) 06/04/2018 05:35 AM   HDL 34 (L) 08/06/2017 01:54 PM   CHOLHDL 4.9 06/04/2018 05:35 AM   LDLCALC 77 06/04/2018 05:35 AM   LDLCALC 91 08/06/2017 01:54 PM    Wt Readings from Last 3 Encounters:  03/31/19 273 lb (123.8 kg)  02/22/19 275 lb (124.7 kg)  01/05/19 290 lb 12.8 oz (131.9 kg)     Objective:    Vital Signs:  BP 126/88   Pulse 91   Temp (!) 97 F (36.1 C)   Ht 5' 8.5" (1.74 m)   Wt 273 lb (123.8 kg)   BMI 40.91 kg/m    VITAL SIGNS:  reviewed GEN:  no acute distress RESPIRATORY:  No audible cough or wheezing over the phone.  Patient able to speak in complete sentences. NEURO:  alert and oriented x 3, no obvious focal deficit PSYCH:  normal affect  ASSESSMENT & PLAN:    Pre-operative clearance -She has planned for a procedure on a soft palate lesion per ENT, patient reports local anesthesia. -Patient has chronic diastolic heart failure and pulmonary hypertension which seems to be stable at this time. -The patient is at low risk for this low risk procedure and can proceed without any further cardiac testing. -Per pharmacy recommendations, Pt takes Xarelto for afib with CHADS2VASc score of 6 (age, sex, CHF, HTN, DM, CAD). SCr 2.01, CrCl 49.8 using actual body weight, 35.8 using adjusted body weight. Pt on appropriately reduced dose of Xarelto. Ok to hold Xarelto for 1-2 days prior to procedure. -I will E fax this note to the requesting provider.  Chronic  diastolic heart failure -Echocardiogram in 05/2018 with low normal EF with increased RV pressures. -She is maintained on high-dose Lasix, potassium, metolazone therapy.  The patient admits to sometimes consolidating 2 of her Lasix doses to 1 dose.  Advised her to take Lasix as ordered on her medication bottle, that she should not take a double dose at 1 time. -Sodium, has been known to eat a lot of deli meats and high sodium foods.  She has seen a nutritionist and is now reading food labels.  She is trying to limit her sodium to 1500 mg.  She feels like she has cut down a lot but knows that she needs to work more on reducing her bread intake and she still has occasional lunch meat and hot dogs. -Through my conversation with her, the patient seems to be stable from a fluid standpoint.  Her weight is down, stable at home, only very mild edema, no orthopnea or significant dyspnea.  Chronic mild DOE is likely related to her excess weight.  Moderate pulmonary hypertension -Echocardiogram revealed right ventricular dilatation with decreased RV function.  Estimated PA pressures in the 40s. -Patient has been advised on weight loss.  She recently had surgery for Charcot foot and is trying to become more active.  Still using a walker.  Persistent atrial fibrillation -Patient did not tolerate Tikosyn in the past due to long QT.  Now off amiodarone due to concern for toxicity.  She is now maintained on beta-blocker for rate control.  CKD stage IV -Baseline creatinine around 2. -Followed by nephrology, Dr. Baird Cancer at [facility], and appreciate follow up on diuretics. -Office visit note from 03/10/2019 obtained and patient was doing well with serum creatinine stable at 2.16 and potassium of 3.5.  She was continued on her diuretics.  Hypertension  -Toprol-XL 50 mg twice daily and diuretics -Blood  pressure well controlled  Morbid obesity -Body mass index is 40.91 kg/m. -Pt would benefit from wt loss.  She has been seeing a nutritionist.   Diabetes type 2 on insulin -A1c 8.8.  Followed by primary care.   COVID-19 Education: The signs and symptoms of COVID-19 were discussed with the patient and how to seek care for testing (follow up with PCP or arrange E-visit).  The importance of social distancing was discussed today.  Time:   Today, I have spent 16 minutes with the patient with telehealth technology discussing the above problems.     Medication Adjustments/Labs and Tests Ordered: Current medicines are reviewed at length with the patient today.  Concerns regarding medicines are outlined above.   Tests Ordered: No orders of the defined types were placed in this encounter.   Medication Changes: No orders of the defined types were placed in this encounter.   Follow Up:  In Person in 3 month(s)  Signed, Daune Perch, NP  03/31/2019 4:32 PM    [facility]

## 2019-04-12 DIAGNOSIS — Z124 Encounter for screening for malignant neoplasm of cervix: Secondary | ICD-10-CM | POA: Diagnosis not present

## 2019-04-12 DIAGNOSIS — N369 Urethral disorder, unspecified: Secondary | ICD-10-CM | POA: Diagnosis not present

## 2019-04-12 DIAGNOSIS — L439 Lichen planus, unspecified: Secondary | ICD-10-CM | POA: Diagnosis not present

## 2019-04-18 ENCOUNTER — Other Ambulatory Visit: Payer: Self-pay

## 2019-04-18 ENCOUNTER — Ambulatory Visit: Payer: Medicare HMO | Attending: Podiatry | Admitting: Physical Therapy

## 2019-04-18 ENCOUNTER — Encounter: Payer: Self-pay | Admitting: Physical Therapy

## 2019-04-18 DIAGNOSIS — M25572 Pain in left ankle and joints of left foot: Secondary | ICD-10-CM | POA: Diagnosis not present

## 2019-04-18 DIAGNOSIS — R262 Difficulty in walking, not elsewhere classified: Secondary | ICD-10-CM

## 2019-04-18 DIAGNOSIS — M25672 Stiffness of left ankle, not elsewhere classified: Secondary | ICD-10-CM | POA: Insufficient documentation

## 2019-04-18 NOTE — Therapy (Signed)
[facility] [place], [place], [address] Phone: [phone]   Fax:  [phone]  Physical Therapy Evaluation  Patient Details  Name: Kristine [name] MRN: [id number] Date of Birth: 07/08/1945 Referring Provider (PT): Erline Hau, DPM   Encounter Date: 04/18/2019  PT End of Session - 04/18/19 2021    Visit Number  1    Number of Visits  12    Date for PT Re-Evaluation  06/06/19    Authorization Type  progress note every 10th visit, KX modifier at 15th visit    PT Start Time  1032    PT Stop Time  1115    PT Time Calculation (min)  43 min    Equipment Utilized During Treatment  --   rolling walker   Activity Tolerance  Patient tolerated treatment well    Behavior During Therapy  [facility] for tasks assessed/performed       Past Medical History:  Diagnosis Date  . Allergy   . Anxiety   . Arthritis   . Asthma   . Atrial fibrillation ([place])   . Cataract   . CHF (congestive heart failure) ([place])   . Chronic bronchitis ([place])   . Chronic kidney disease   . Clotting disorder ([place])   . Depression   . GERD (gastroesophageal reflux disease)   . High cholesterol   . History of hiatal hernia   . HTN (hypertension)   . Mild aortic sclerosis   . Morbid obesity ([place])   . Neuromuscular disorder ([place])   . Osteoporosis   . Persistent atrial fibrillation ([place])   . Scoliosis   . Spinal stenosis   . Thyroid disease   . Type II diabetes mellitus ([place])   . Vitamin D deficiency     Past Surgical History:  Procedure Laterality Date  . BLADDER SUSPENSION  1980s  . CARDIAC CATHETERIZATION  11/16/09   SMOOTH AND NORMAL  . CARDIOVERSION N/A 01/07/2015   Procedure: CARDIOVERSION;  Surgeon: Skeet Latch, MD;  Location: [facility] ENDOSCOPY;  Service: Cardiovascular;  Laterality: N/A;  . CARDIOVERSION N/A 03/11/2015   Procedure: CARDIOVERSION;  Surgeon: Skeet Latch, MD;  Location: [place];  Service: Cardiovascular;   Laterality: N/A;  . CARPAL TUNNEL RELEASE Left 1970s  . CATARACT EXTRACTION W/ INTRAOCULAR LENS  IMPLANT, BILATERAL Bilateral ~ 2010  . COLONOSCOPY  08/14/08  . FINGER FRACTURE SURGERY Right 1970s   "ring finger"  . FRACTURE SURGERY    . LAPAROSCOPIC CHOLECYSTECTOMY  1980s  . TUBAL LIGATION    . VAGINAL HYSTERECTOMY  1980    There were no vitals filed for this visit.   Subjective Assessment - 04/18/19 2017    Subjective  COVID-19 screening performed upon arrival.Patient arrives to physical therapy with reports of left foot and ankle pain, difficulty walking, and loss of foot/ankle strength due to a left foot double arthrodesis, medial column arthrodesis on 02/11/2018. Patient reports some independence with basic ADLs but reports her husband assists when needed. Patient has been compliant with HEP of walking program to build tolerance, but patient reports her walking and standing tolerance is limited to about 7 minutes. Patient reports utilizing rolling walker and rollator and using seat for rest breaks. Patient reports pain at worst as 10/10 and pain at best as 0/10 with rest and elevation. Patient's goals are to decrease pain, improve movement, improve strength, stand longer, improve ability to perform home activities, and walk without an AD.    Pertinent History  Left foot  double arthrodesis, medial column arthrodesis 02/11/2018, AFib, Asthma, CHF, CKD, DM type II, OP, HTN, Diabetic neuropathy.    How long can you stand comfortably?  7 minutes    How long can you walk comfortably?  7 minutes    Patient Stated Goals  walk without cane    Currently in Pain?  Yes    Pain Score  2     Pain Location  Foot    Pain Orientation  Left    Pain Descriptors / Indicators  Sharp;Numbness    Pain Type  Surgical pain;Neuropathic pain    Pain Onset  More than a month ago    Pain Frequency  Constant    Aggravating Factors   "stress on foot"    Pain Relieving Factors  "not walking on it"    Effect of  Pain on Daily Activities  short tolerance for walking and standing due to pain and neuropathy.         [facility] PT Assessment - 04/18/19 0001      Assessment   Medical Diagnosis  Charcot Foot due to diabetes Mellitus    Referring Provider (PT)  Erline Hau, DPM    Onset Date/Surgical Date  02/11/18    Next MD Visit  Released from surgeon    Prior Therapy  yes      Precautions   Precautions  Fall      Restrictions   Weight Bearing Restrictions  No      Balance Screen   Has the patient fallen in the past 6 months  Yes    How many times?  1    Has the patient had a decrease in activity level because of a fear of falling?   Yes    Is the patient reluctant to leave their home because of a fear of falling?   Yes      Home Environment   Living Environment  Private residence      Prior Function   Level of Independence  Needs assistance with ADLs      Observation/Other Assessments-Edema    Edema  Figure 8      Figure 8 Edema   Figure 8 - Left   60 cm Figure 8      ROM / Strength   AROM / PROM / Strength  AROM;Strength;PROM      AROM   Overall AROM   Deficits    AROM Assessment Site  Ankle    Right/Left Ankle  Left    Left Ankle Dorsiflexion  0    Left Ankle Plantar Flexion  20    Left Ankle Inversion  0    Left Ankle Eversion  6      PROM   Overall PROM   Deficits    PROM Assessment Site  Ankle    Right/Left Ankle  Left    Left Ankle Dorsiflexion  4    Left Ankle Plantar Flexion  42    Left Ankle Inversion  4    Left Ankle Eversion  8      Strength   Strength Assessment Site  Ankle    Left Ankle Dorsiflexion  4-/5    Left Ankle Plantar Flexion  4-/5    Left Ankle Inversion  3-/5    Left Ankle Eversion  3-/5      Palpation   Palpation comment  Reports feeling numb during palpation but did states some tenderness to lateral aspect of left foot  Transfers   Comments  slow, cautious sit to stand transfers      Ambulation/Gait   Ambulation/Gait Assistance   5: Supervision    Assistive device  Rolling walker    Gait Pattern  Step-through pattern;Decreased stride length;Decreased stance time - left;Decreased step length - right;Decreased dorsiflexion - left;Decreased weight shift to left;Trunk flexed;Poor foot clearance - left                Objective measurements completed on examination: See above findings.                   PT Long Term Goals - 04/18/19 2023      PT LONG TERM GOAL #1   Title  Patient will be independent with HEP and progression.    Time  6    Period  Weeks    Status  New      PT LONG TERM GOAL #2   Title  Patient will demonstrate 6+ degrees of active right ankle DF to improve gait mechanicss.    Time  6    Period  Weeks    Status  New      PT LONG TERM GOAL #3   Title  Pt will demonstrate 4+ /5 left ankle MMT in all planes to improve functional stability.    Time  6    Period  Weeks    Status  New      PT LONG TERM GOAL #4   Title  Patient will ambulate 200 feet with LRAD with left foot/ankle pain less than or equal to 5/10.    Time  6    Period  Weeks    Status  New             Plan - 04/18/19 2044    Clinical Impression Statement  Patient is a 74 year old female who presents to physical therapy with left foot/ankle pain, abnormal gait pattern, and pain with weight bearing that began after a left foot double arthrodesis, medial column arthrodesis on 02/11/2018. Patient demonstrated decreased left ankle MMT in all planes and decreased left ankle MMT in all planes. Patient and PT discussed plan of care and HEP to address deficits and goals. Patient would benefit from skilled physical therapy to address deficits an address patient's goals.    Personal Factors and Comorbidities  Age;Comorbidity 3+    Comorbidities  Left foot double arthrodesis, medial column arthrodesis 02/11/2018, AFib, Asthma, CHF, CKD, DM type II, OP, HTN, Diabetic neuropathy.    Examination-Activity Limitations   Stand;Dressing    Examination-Participation Restrictions  Community Activity    Stability/Clinical Decision Making  Stable/Uncomplicated    Clinical Decision Making  Low    Rehab Potential  Good    PT Frequency  2x / week    PT Duration  6 weeks    PT Treatment/Interventions  ADLs/Self Care Home Management;Electrical Stimulation;Gait training;Stair training;Functional mobility training;Therapeutic activities;Therapeutic exercise;Balance training;Neuromuscular re-education;Patient/family education;Manual techniques;Moist Heat;Passive range of motion;Vasopneumatic Device;Taping;Scar mobilization    PT Next Visit Plan  Nustep, ankle ROM exercises, gastroc and soleus stretching, Modalities P       Patient will benefit from skilled therapeutic intervention in order to improve the following deficits and impairments:  Abnormal gait, Pain, Decreased activity tolerance, Decreased balance, Difficulty walking, Decreased strength, Obesity, Increased edema  Visit Diagnosis: Pain in left ankle and joints of left foot  Difficulty in walking, not elsewhere classified  Stiffness of left ankle, not elsewhere classified  Problem List Patient Active Problem List   Diagnosis Date Noted  . Gout 01/03/2019  . Pulmonary hypertension ([place]) 01/03/2019  . Essential hypertension 01/03/2019  . Candidiasis, intertriginous 01/03/2019  . Acute on chronic systolic CHF (congestive heart failure) ([place]) 06/03/2018  . CKD (chronic kidney disease) stage 4, GFR 15-29 ml/min (HCC) 06/03/2018  . Gait abnormality 02/09/2017  . Obstructive sleep apnea 02/09/2017  . Diabetic peripheral neuropathy ([place]) 09/02/2016  . Peripheral neuropathy 07/29/2016  . Spinal stenosis of lumbar region 07/29/2016  . Excessive daytime sleepiness 07/29/2016  . Morbid obesity ([place]) 07/29/2016  . 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 Community acquired pneumonia 05/31/2015  . CAP (community acquired pneumonia) 05/31/2015  . Anemia  05/31/2015  . Acute on chronic diastolic (congestive) heart failure ([place]) 05/31/2015  . Paroxysmal atrial fibrillation (HCC)   . Obesity 10/15/2013  . Diastolic dysfunction with chronic heart failure ([place]) 06/23/2013  . Persistent atrial fibrillation ([place]) 06/20/2010  . Pneumonia   . Abnormal cardiovascular function study   . High cholesterol   . Scoliosis   . Spinal stenosis   . Anxiety   . Osteoarthritis   . Osteoporosis   . Mild aortic sclerosis   . Degenerative disc disease   . Vitamin D deficiency   . HEMOPTYSIS UNSPECIFIED 05/12/2010  . Diabetes mellitus, type 2 ([place]) 11/19/2009  . Hypertensive cardiovascular disease 11/19/2009  . DYSPNEA ON EXERTION 11/19/2009    Gabriela Eves, PT, DPT 04/18/2019, 9:42 PM  [facility] [address] [place], [place], [address] Phone: [phone]   Fax:  [phone]  Name: Kristine [name] MRN: [id number] Date of Birth: 04-Oct-1945

## 2019-04-20 ENCOUNTER — Ambulatory Visit: Payer: Medicare HMO | Admitting: Physical Therapy

## 2019-04-20 ENCOUNTER — Other Ambulatory Visit: Payer: Self-pay

## 2019-04-20 ENCOUNTER — Encounter: Payer: Self-pay | Admitting: Physical Therapy

## 2019-04-20 DIAGNOSIS — R262 Difficulty in walking, not elsewhere classified: Secondary | ICD-10-CM

## 2019-04-20 DIAGNOSIS — M25572 Pain in left ankle and joints of left foot: Secondary | ICD-10-CM | POA: Diagnosis not present

## 2019-04-20 DIAGNOSIS — M25672 Stiffness of left ankle, not elsewhere classified: Secondary | ICD-10-CM

## 2019-04-20 NOTE — Therapy (Signed)
[facility] [place], [place], [address] Phone: [phone]   Fax:  [phone]  Physical Therapy Treatment  Patient Details  Name: Kristine [name] MRN: [id number] Date of Birth: Jun 09, 1945 Referring Provider (PT): Erline Hau, DPM   Encounter Date: 04/20/2019  PT End of Session - 04/20/19 1135    Visit Number  2    Number of Visits  12    Date for PT Re-Evaluation  06/06/19    Authorization Type  progress note every 10th visit, KX modifier at 15th visit    PT Start Time  1126   late to treatment   PT Stop Time  1200    PT Time Calculation (min)  34 min    Activity Tolerance  Patient tolerated treatment well    Behavior During Therapy  [facility] for tasks assessed/performed       Past Medical History:  Diagnosis Date  . Allergy   . Anxiety   . Arthritis   . Asthma   . Atrial fibrillation ([place])   . Cataract   . CHF (congestive heart failure) ([place])   . Chronic bronchitis ([place])   . Chronic kidney disease   . Clotting disorder ([place])   . Depression   . GERD (gastroesophageal reflux disease)   . High cholesterol   . History of hiatal hernia   . HTN (hypertension)   . Mild aortic sclerosis   . Morbid obesity ([place])   . Neuromuscular disorder ([place])   . Osteoporosis   . Persistent atrial fibrillation ([place])   . Scoliosis   . Spinal stenosis   . Thyroid disease   . Type II diabetes mellitus ([place])   . Vitamin D deficiency     Past Surgical History:  Procedure Laterality Date  . BLADDER SUSPENSION  1980s  . CARDIAC CATHETERIZATION  11/16/09   SMOOTH AND NORMAL  . CARDIOVERSION N/A 01/07/2015   Procedure: CARDIOVERSION;  Surgeon: Skeet Latch, MD;  Location: [facility] ENDOSCOPY;  Service: Cardiovascular;  Laterality: N/A;  . CARDIOVERSION N/A 03/11/2015   Procedure: CARDIOVERSION;  Surgeon: Skeet Latch, MD;  Location: [place];  Service: Cardiovascular;  Laterality: N/A;  . CARPAL TUNNEL RELEASE Left  1970s  . CATARACT EXTRACTION W/ INTRAOCULAR LENS  IMPLANT, BILATERAL Bilateral ~ 2010  . COLONOSCOPY  08/14/08  . FINGER FRACTURE SURGERY Right 1970s   "ring finger"  . FRACTURE SURGERY    . LAPAROSCOPIC CHOLECYSTECTOMY  1980s  . TUBAL LIGATION    . VAGINAL HYSTERECTOMY  1980    There were no vitals filed for this visit.  Subjective Assessment - 04/20/19 1130    Subjective  Covid-19 screening performed upon arrival. Patient reported no pain upon arrival    Pertinent History  Left foot double arthrodesis, medial column arthrodesis 02/11/2018, AFib, Asthma, CHF, CKD, DM type II, OP, HTN, Diabetic neuropathy.    How long can you stand comfortably?  7 minutes    How long can you walk comfortably?  7 minutes    Patient Stated Goals  walk without cane    Currently in Pain?  No/denies                       [facility] Adult PT Treatment/Exercise - 04/20/19 0001      Exercises   Exercises  Ankle;Knee/Hip      Knee/Hip Exercises: Aerobic   Nustep  L1 x10min, UE/LE, monitored      Knee/Hip Exercises: Standing   Hip Flexion  AROM;Both;Knee bent;Stengthening;2 sets;10 reps    Hip Abduction  AROM;Stengthening;Both;2 sets;10 reps;Knee straight      Knee/Hip Exercises: Seated   Sit to Sand  10 reps;with UE support      Ankle Exercises: Stretches   Gastroc Stretch  2 reps;20 seconds   bil LE seated with towel for stretch     Ankle Exercises: Seated   Heel Raises  Both;20 reps    Toe Raise  20 reps    Other Seated Ankle Exercises  seated rockerboard x69min for  ROM    Other Seated Ankle Exercises  seated dyna dics for circles 2x10 each LE      Ankle Exercises: Supine   T-Band  yellow t-band left ankle 2x10                  PT Long Term Goals - 04/20/19 1132      PT LONG TERM GOAL #1   Title  Patient will be independent with HEP and progression.    Time  6    Period  Weeks    Status  On-going      PT LONG TERM GOAL #2   Title  Patient will demonstrate 6+  degrees of active right ankle DF to improve gait mechanicss.    Time  6    Period  Weeks    Status  On-going      PT LONG TERM GOAL #3   Title  Pt will demonstrate 4+ /5 left ankle MMT in all planes to improve functional stability.    Time  6    Period  Weeks    Status  On-going      PT LONG TERM GOAL #4   Title  Patient will ambulate 200 feet with LRAD with left foot/ankle pain less than or equal to 5/10.    Time  6    Period  Weeks    Status  On-going            Plan - 04/20/19 1157    Clinical Impression Statement  Patient tolerated treatment well today. Patient able to progress with exercises today. Patient reported a little increased pain after yellow t-band eversion yet eased off. Patient required educational cues for technique. Patient goals ongoing at this time.    Personal Factors and Comorbidities  Age;Comorbidity 3+    Comorbidities  Left foot double arthrodesis, medial column arthrodesis 02/11/2018, AFib, Asthma, CHF, CKD, DM type II, OP, HTN, Diabetic neuropathy.    Examination-Activity Limitations  Stand;Dressing    Examination-Participation Restrictions  Community Activity    Stability/Clinical Decision Making  Stable/Uncomplicated    Rehab Potential  Good    PT Frequency  2x / week    PT Duration  6 weeks    PT Treatment/Interventions  ADLs/Self Care Home Management;Electrical Stimulation;Gait training;Stair training;Functional mobility training;Therapeutic activities;Therapeutic exercise;Balance training;Neuromuscular re-education;Patient/family education;Manual techniques;Moist Heat;Passive range of motion;Vasopneumatic Device;Taping;Scar mobilization    PT Next Visit Plan  cont with POC for Nustep, ankle ROM exercises, gastroc and soleus stretching, Modalities P    Consulted and Agree with Plan of Care  Patient       Patient will benefit from skilled therapeutic intervention in order to improve the following deficits and impairments:  Abnormal gait, Pain,  Decreased activity tolerance, Decreased balance, Difficulty walking, Decreased strength, Obesity, Increased edema  Visit Diagnosis: Pain in left ankle and joints of left foot  Stiffness of left ankle, not elsewhere classified  Difficulty in walking, not elsewhere classified  Problem List Patient Active Problem List   Diagnosis Date Noted  . Gout 01/03/2019  . Pulmonary hypertension ([place]) 01/03/2019  . Essential hypertension 01/03/2019  . Candidiasis, intertriginous 01/03/2019  . Acute on chronic systolic CHF (congestive heart failure) ([place]) 06/03/2018  . CKD (chronic kidney disease) stage 4, GFR 15-29 ml/min (HCC) 06/03/2018  . Gait abnormality 02/09/2017  . Obstructive sleep apnea 02/09/2017  . Diabetic peripheral neuropathy ([place]) 09/02/2016  . Peripheral neuropathy 07/29/2016  . Spinal stenosis of lumbar region 07/29/2016  . Excessive daytime sleepiness 07/29/2016  . Morbid obesity ([place]) 07/29/2016  . 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 Community acquired pneumonia 05/31/2015  . CAP (community acquired pneumonia) 05/31/2015  . Anemia 05/31/2015  . Acute on chronic diastolic (congestive) heart failure ([place]) 05/31/2015  . Paroxysmal atrial fibrillation (HCC)   . Obesity 10/15/2013  . Diastolic dysfunction with chronic heart failure ([place]) 06/23/2013  . Persistent atrial fibrillation ([place]) 06/20/2010  . Pneumonia   . Abnormal cardiovascular function study   . High cholesterol   . Scoliosis   . Spinal stenosis   . Anxiety   . Osteoarthritis   . Osteoporosis   . Mild aortic sclerosis   . Degenerative disc disease   . Vitamin D deficiency   . HEMOPTYSIS UNSPECIFIED 05/12/2010  . Diabetes mellitus, type 2 ([place]) 11/19/2009  . Hypertensive cardiovascular disease 11/19/2009  . DYSPNEA ON EXERTION 11/19/2009    Phillips Climes, PTA 04/20/2019, 12:04 PM  [facility] [address] [place], [place], [address] Phone:  [phone]   Fax:  [phone]  Name: Rogan Ecklund MRN: [id number] Date of Birth: 04/25/1945

## 2019-04-25 ENCOUNTER — Ambulatory Visit: Payer: Medicare HMO | Admitting: Physical Therapy

## 2019-04-27 ENCOUNTER — Encounter: Payer: Medicare HMO | Admitting: Physical Therapy

## 2019-05-07 ENCOUNTER — Ambulatory Visit: Payer: Medicare HMO

## 2019-06-29 ENCOUNTER — Other Ambulatory Visit: Payer: Self-pay

## 2019-06-29 ENCOUNTER — Encounter: Payer: Self-pay | Admitting: Cardiovascular Disease

## 2019-06-29 ENCOUNTER — Ambulatory Visit: Payer: Medicare HMO | Admitting: Cardiovascular Disease

## 2019-06-29 VITALS — BP 102/68 | HR 65 | Ht 68.5 in | Wt 287.8 lb

## 2019-06-29 DIAGNOSIS — I480 Paroxysmal atrial fibrillation: Secondary | ICD-10-CM

## 2019-06-29 DIAGNOSIS — I5042 Chronic combined systolic (congestive) and diastolic (congestive) heart failure: Secondary | ICD-10-CM | POA: Insufficient documentation

## 2019-06-29 NOTE — Progress Notes (Signed)
Cardiology Office Note   Date:  06/29/2019   ID:  Kristine [name], DOB 10/07/1945, MRN [id number]  PCP:  Prince Solian, MD  Cardiologist:   Mertie Moores, MD   No chief complaint on file.  1. Atrial fibrillation 2. Diabetes mellitus 3. Chronic diastolic CHF 4. Obesity.      Kristine [name] is a 74 year old female with a history of atrial fibrillation. She has done well since I last saw her. She's not had any episodes of chest pain or shortness breath. Her blood pressure readings have been normal.  She is tolerating the Xarelto fairly well. She has some occasional bleeding from the gums. She enjoys not having to check her INR levels.   She has occasional palpitations and heart racing. This typically can rest and these will resolve. She is able to wak about 10 minutes at a time.   Oct. 24, 2014:  June 23, 2013:  Kristine [name]'s BP is elevated. She had a recent echocardiogram that revealed a left ventricle systolic function that was at the lower limits of normal-50%. Study Conclusions  - Left ventricle: The cavity size was normal. Wall thickness was increased in a pattern of mild LVH. The estimated ejection fraction was 50%. Diffuse hypokinesis. Features are consistent with a pseudonormal left ventricular filling pattern, with concomitant abnormal relaxation and increased filling pressure (grade 2 diastolic dysfunction). - Aortic valve: There was no stenosis. - Mitral valve: Mildly calcified annulus. Mildly calcified leaflets . Trivial regurgitation. - Left atrium: The atrium was moderately dilated. - Right ventricle: The cavity size was normal. Systolic function was mildly reduced. - Right atrium: The atrium was moderately dilated. - Systemic veins: IVC measured 1.7 cm with < 50% respirophasic variation, suggesting RA pressure 8 mmHg. - Pericardium, extracardiac: A trivial pericardial effusion was identified posterior to the heart  She was having some abdominal pain  and   September 29, 2013:  [name] is doing well. Stressed about coming here.   Jan. 7, 2015:  Kristine [name] is a 74 yo with hx of Afib and diastolic dysfunction She converted to NSR in the hospital with flecainide. She was tried on [place] but her QT interval prolonged. Was changed to Flecainide and has maintained NSR.  She is exercising regularly - 30 minutes a day. Is on Lasix 80 mg a day and mtalazone 2. 5 once a week. Is still retaining fluid. Trying to avoid salt in food.    June 11, 2014:   Kristine [name] is a 74 y.o. female who presents for follow-up of her atrial fibrillation. She has had some issues with fluid retention and  has been on metalazone.   Sept. 19, 2016:  Doing well.  Has had some shortness of breath - improved with increasing lasix to 80 BID .  BP is still high this am.  BP at home has been normal .  June 18, 2015:  She has tried [place] but had prolonged QT - so it was stopped Tried on flecainide Was tried on amiodarone  Had a cardioversion in Dec.   Amiodarone dose was lowered to 200 mg a day and she is now back in atrial fib She is not as symptomatic today  She is very fatigued. Has not been sleeping well at all Has lots of arm fatigue in her arms with any activity   Nov. 2, 2017:    Still short of breath No CP  Active,  Has a pins and needle sensation   August 27, 2016:  Kristine [name]  is seen back today for follow up of her atrial fib  Has failed Flecainide, tikosyn and now  Is on amiodarone.   We had her on a reduced dose of amiodarone due to symptoms of peripheral neuropathy. The dose was increased up to 200 mg daily by Dr. Rayann Heman.  She is tentatively scheduled for cardioversion. She does not want to consider an ablation .   February 25, 2017:  Kristine [name] is seen back today for her chronic atrial fibrillation. She has discontinued the amiodarone.  She seems to be tolerating the atrial fibrillation quite well.  We have adopted a strategy of  anticoagulation and rate control.  She developed acute on chronic diastolic CHF last Thursday.   She has been on Lyrica  Went to the ER.   She took an extra Lasix on the way there and urinated all of the extra weight  She still takes metolazone very rarely - perhaps   Aug 06, 2017:  Had some weakness while coming in this afternoon.    Had fasted all day - until 2 PM  Feeling better.  Breathing is good   She is had lots of leg edema.  She has been seeing Dr. [name].  She now takes Lasix 80 mg 3 times a day.  She takes metolazone 2.5 mg 3 days a week.  February 01, 2018  Is having problem of her Charcot foot.  Here for pre op visit Echo in Oct. Showed EF of 45-50%.    She has grade 2 diastolic dysfunction by previous echo .  Moderate pulmonary HTN.  June 29, 2019:  Kristine [name] is seen today for follow-up of her chronic combined systolic and diastolic congestive heart failure.  Echocardiogram showed an EF of 45 to 50%.  She has grade 2 diastolic dysfunction.  She has moderate pulmonary hypertension.   Walks with a walker,  Balance issues from charcot foot issues  [place] and injured her left knee 2 months ago .  No cardiac complaints.  Has lots of neurophathyu  -  Foot pain and hand pain . Breathing is good,  No CP  Lipids are reviewed.  Still eating  Salty foods .    Past Medical History:  Diagnosis Date  . Allergy   . Anxiety   . Arthritis   . Asthma   . Atrial fibrillation ([place])   . Cataract   . CHF (congestive heart failure) ([place])   . Chronic bronchitis ([place])   . Chronic kidney disease   . Clotting disorder ([place])   . Depression   . GERD (gastroesophageal reflux disease)   . High cholesterol   . History of hiatal hernia   . HTN (hypertension)   . Mild aortic sclerosis   . Morbid obesity ([place])   . Neuromuscular disorder ([place])   . Osteoporosis   . Persistent atrial fibrillation ([place])   . Scoliosis   . Spinal stenosis   . Thyroid disease   . Type II diabetes mellitus  ([place])   . Vitamin D deficiency     Past Surgical History:  Procedure Laterality Date  . BLADDER SUSPENSION  1980s  . CARDIAC CATHETERIZATION  11/16/09   SMOOTH AND NORMAL  . CARDIOVERSION N/A 01/07/2015   Procedure: CARDIOVERSION;  Surgeon: Skeet Latch, MD;  Location: [facility];  Service: Cardiovascular;  Laterality: N/A;  . CARDIOVERSION N/A 03/11/2015   Procedure: CARDIOVERSION;  Surgeon: Skeet Latch, MD;  Location: [facility];  Service: Cardiovascular;  Laterality: N/A;  . CARPAL TUNNEL RELEASE  Left 1970s  . CATARACT EXTRACTION W/ INTRAOCULAR LENS  IMPLANT, BILATERAL Bilateral ~ 2010  . COLONOSCOPY  08/14/08  . FINGER FRACTURE SURGERY Right 1970s   "ring finger"  . FRACTURE SURGERY    . LAPAROSCOPIC CHOLECYSTECTOMY  1980s  . TUBAL LIGATION    . VAGINAL HYSTERECTOMY  1980     Current Outpatient Medications  Medication Sig Dispense Refill  . ALPRAZolam (XANAX) 0.25 MG tablet Take 0.125-0.25 mg by mouth 2 (two) times daily as needed for anxiety.   0  . BD PEN NEEDLE NANO U/F 32G X 4 MM MISC Inject 1 each as directed See admin instructions. Use pen needles with insulin pens daily  11  . ciclopirox (LOPROX) 0.77 % cream Apply 1 application topically 2 (two) times daily. For 4 weeks    . clindamycin (CLEOCIN) 300 MG capsule Take 300 mg by mouth 2 (two) times daily. For dental appt    . colchicine 0.6 MG tablet Take 0.5 tablets by mouth 2 (two) times daily as needed. For gout flare ups    . estradiol (ESTRACE) 0.1 MG/GM vaginal cream Place 1 Applicatorful vaginally as needed (Estrace).     . furosemide (LASIX) 80 MG tablet Take 2 tablets by mouth 2 (two) times daily.    . insulin aspart (NOVOLOG) 100 UNIT/ML injection Inject 30 Units into the skin 4 (four) times daily as needed. Use on a sliding scale    . Insulin Degludec (TRESIBA FLEXTOUCH) 200 UNIT/ML SOPN Inject 264 Units into the skin as directed. 100 units in the morning, 64 units at lunch, 100 units at night    .  KLOR-CON M20 20 MEQ tablet Take 60 mEq by mouth 2 (two) times daily.     Marland Kitchen levothyroxine (SYNTHROID) 75 MCG tablet Take 75 mcg by mouth every morning.    Marland Kitchen levothyroxine (SYNTHROID, LEVOTHROID) 50 MCG tablet Take 50 mcg by mouth daily.     Marland Kitchen LYRICA 50 MG capsule Take 50-150 mg by mouth See admin instructions. Take 50 mg in the morning 50 mg at lunch and up to 150mg  at bedtime  3  . metolazone (ZAROXOLYN) 2.5 MG tablet Take 1 tablet by mouth 3 (three) times a week. Mon, Wed, Fri    . metoprolol succinate (TOPROL-XL) 50 MG 24 hr tablet Take 1 tablet (50 mg total) by mouth 2 (two) times daily. Take with or immediately following a meal. 180 tablet 3  . nystatin (MYCOSTATIN/NYSTOP) powder Apply 1 application topically as needed.    . traMADol (ULTRAM) 50 MG tablet Take 50 mg by mouth 3 (three) times daily as needed.    . triamcinolone ointment (KENALOG) 0.1 % Apply 1 application topically as needed.    Marland Kitchen ULORIC 80 MG TABS Take 1 tablet by mouth daily.  3  . Vitamin D, Ergocalciferol, (DRISDOL) 50000 UNITS CAPS Take 50,000 Units by mouth every 7 (seven) days. Takes on Thursday    . XARELTO 15 MG TABS tablet Take 15 mg by mouth daily with supper.      No current facility-administered medications for this visit.    Allergies:   Albuterol, Epinephrine, Penicillins, Torsemide, Bee venom, and Ivp dye [iodinated diagnostic agents]    Social History:  The patient  reports that she has never smoked. She has never used smokeless tobacco. She reports that she does not drink alcohol or use drugs.   Family History:  The patient's family history includes Atrial fibrillation in her father; CAD in her brother and sister;  Congestive Heart Failure in her mother; Heart failure in her mother; Hypertension in her brother, father, mother, sister, and son; Stroke in her father.    ROS:   Noted in current history, otherwise review of systems is negative.  Physical Exam: Blood pressure 102/68, pulse 65, height 5' 8.5"  (1.74 m), weight 287 lb 12 oz (130.5 kg), SpO2 96 %. Body mass index is 43.12 kg/m.  GEN:   Elderly female,  Morbidly obese  HEENT: Normal NECK: No JVD; No carotid bruits LYMPHATICS: No lymphadenopathy CARDIAC: RRR , no murmurs, rubs, gallops RESPIRATORY:  Clear to auscultation without rales, wheezing or rhonchi  ABDOMEN: Soft, non-tender, non-distended MUSCULOSKELETAL:   bilat leg edema , left > right  SKIN: Warm and dry NEUROLOGIC:  Alert and oriented x 3   EKG:      Recent Labs: 01/02/2019: Hemoglobin 13.9; Platelets 121 01/03/2019: B Natriuretic Peptide 243.1 01/04/2019: Magnesium 2.4 01/05/2019: BUN 50; Creatinine, Ser 2.01; Potassium 3.4; Sodium 141    Lipid Panel    Component Value Date/Time   CHOL 123 06/04/2018 0535   CHOL 180 08/06/2017 1354   TRIG 105 06/04/2018 0535   HDL 25 (L) 06/04/2018 0535   HDL 34 (L) 08/06/2017 1354   CHOLHDL 4.9 06/04/2018 0535   VLDL 21 06/04/2018 0535   LDLCALC 77 06/04/2018 0535   LDLCALC 91 08/06/2017 1354      Wt Readings from Last 3 Encounters:  06/29/19 287 lb 12 oz (130.5 kg)  03/31/19 273 lb (123.8 kg)  02/22/19 275 lb (124.7 kg)      Other studies Reviewed: Additional studies/ records that were reviewed today include: . Review of the above records demonstrates:    ASSESSMENT AND PLAN:  1. Atrial fibrillation -   Paroxysmal AF .   Stable ,  Cont. xarelto   2.  . Chronic diastolic CHF  :     Still eating salty foods.   3. Obesity.     Encouraged weight loss   4.  CKD   -  Followed by primary   5.  Charcot foot:  .  6.  Dental :  She needs a root canal.   Will wait and hear back from the dentist.  It will be ok for her to hold xarelto for 2 days if the dentist requests.    Current medicines are reviewed at length with the patient today.  The patient does not have concerns regarding medicines.  The following changes have been made:  no change  Labs/ tests ordered today include:   No orders of the  defined types were placed in this encounter.   Disposition:   FU with me in 6 months      Signed, Mertie Moores, MD  06/29/2019 10:39 AM    [facility] [address], [place], [place]  [address] Phone: [phone]; Fax: [phone]

## 2019-06-29 NOTE — Patient Instructions (Signed)
Medication Instructions:  Your physician recommends that you continue on your current medications as directed. Please refer to the Current Medication list given to you today.  *If you need a refill on your cardiac medications before your next appointment, please call your pharmacy*   Lab Work: None Ordered If you have labs (blood work) drawn today and your tests are completely normal, you will receive your results only by: . MyChart Message (if you have MyChart) OR . A paper copy in the mail If you have any lab test that is abnormal or we need to change your treatment, we will call you to review the results.   Testing/Procedures: None Ordered   Follow-Up: At [facility], you and your health needs are our priority.  As part of our continuing mission to provide you with exceptional heart care, we have created designated Provider Care Teams.  These Care Teams include your primary Cardiologist (physician) and Advanced Practice Providers (APPs -  Physician Assistants and Nurse Practitioners) who all work together to provide you with the care you need, when you need it.  We recommend signing up for the patient portal called "MyChart".  Sign up information is provided on this After Visit Summary.  MyChart is used to connect with patients for Virtual Visits (Telemedicine).  Patients are able to view lab/test results, encounter notes, upcoming appointments, etc.  Non-urgent messages can be sent to your provider as well.   To learn more about what you can do with MyChart, go to [email or web address].    Your next appointment:   1 year(s)  The format for your next appointment:   In Person  Provider:   You may see Philip Nahser, MD or one of the following Advanced Practice Providers on your designated Care Team:    Scott Weaver, PA-C  Vin Bhagat, PA-C  Janine Hammond, NP     

## 2019-08-16 ENCOUNTER — Other Ambulatory Visit: Payer: Self-pay

## 2019-08-16 MED ORDER — METOLAZONE 2.5 MG PO TABS: 2.5000 mg | ORAL_TABLET | ORAL | 10 refills | Status: DC

## 2019-08-16 NOTE — Telephone Encounter (Signed)
Pt's medication was sent to pt's pharmacy as requested. Confirmation received.  °

## 2019-08-23 ENCOUNTER — Other Ambulatory Visit: Payer: Self-pay | Admitting: Internal Medicine

## 2019-08-23 DIAGNOSIS — Z1231 Encounter for screening mammogram for malignant neoplasm of breast: Secondary | ICD-10-CM

## 2019-08-30 ENCOUNTER — Ambulatory Visit
Admission: RE | Admit: 2019-08-30 | Discharge: 2019-08-30 | Disposition: A | Discharge status: Home / Self Care | Payer: Medicare HMO | Source: Ambulatory Visit | Attending: Internal Medicine | Admitting: Internal Medicine

## 2019-08-30 ENCOUNTER — Other Ambulatory Visit: Payer: Self-pay

## 2019-08-30 DIAGNOSIS — Z1231 Encounter for screening mammogram for malignant neoplasm of breast: Secondary | ICD-10-CM

## 2019-08-31 ENCOUNTER — Ambulatory Visit (INDEPENDENT_AMBULATORY_CARE_PROVIDER_SITE_OTHER): Payer: Medicare HMO | Admitting: Ophthalmology

## 2019-08-31 ENCOUNTER — Encounter (INDEPENDENT_AMBULATORY_CARE_PROVIDER_SITE_OTHER): Payer: Self-pay | Admitting: Ophthalmology

## 2019-08-31 DIAGNOSIS — H35049 Retinal micro-aneurysms, unspecified, unspecified eye: Secondary | ICD-10-CM | POA: Insufficient documentation

## 2019-08-31 DIAGNOSIS — E113392 Type 2 diabetes mellitus with moderate nonproliferative diabetic retinopathy without macular edema, left eye: Secondary | ICD-10-CM | POA: Diagnosis not present

## 2019-08-31 DIAGNOSIS — E113391 Type 2 diabetes mellitus with moderate nonproliferative diabetic retinopathy without macular edema, right eye: Secondary | ICD-10-CM | POA: Diagnosis not present

## 2019-08-31 DIAGNOSIS — H35043 Retinal micro-aneurysms, unspecified, bilateral: Secondary | ICD-10-CM

## 2019-08-31 NOTE — Progress Notes (Signed)
08/31/2019     CHIEF COMPLAINT Patient presents for Diabetic Eye Exam   HISTORY OF PRESENT ILLNESS: Kristine [name] is a 74 y.o. female who presents to the clinic today for:   HPI    Diabetic Eye Exam    Vision is stable.  Diabetes characteristics include Type 2.  Blood sugar level is controlled.  Last Blood Glucose 188.  Last A1C 8.2.  I, the attending physician,  performed the HPI with the patient and updated documentation appropriately.          Comments    6 Month Diabetic Excam OU. FP Pt states she has a hard time seeing while laying down and watching TV. Pt states it seems like something is in the way of her vision. Denies FOL and floaters.       Last edited by Tilda Franco on 08/31/2019  8:44 AM. (History)      Referring physician: Prince Solian, MD [place],  [place] [address]  HISTORICAL INFORMATION:   Selected notes from the MEDICAL RECORD NUMBER    Lab Results  Component Value Date   HGBA1C 8.8 (H) 01/03/2019     CURRENT MEDICATIONS: No current outpatient medications on file. (Ophthalmic Drugs)   No current facility-administered medications for this visit. (Ophthalmic Drugs)   Current Outpatient Medications (Other)  Medication Sig  . ALPRAZolam (XANAX) 0.25 MG tablet Take 0.125-0.25 mg by mouth 2 (two) times daily as needed for anxiety.   . BD PEN NEEDLE NANO U/F 32G X 4 MM MISC Inject 1 each as directed See admin instructions. Use pen needles with insulin pens daily  . ciclopirox (LOPROX) 0.77 % cream Apply 1 application topically 2 (two) times daily. For 4 weeks  . clindamycin (CLEOCIN) 300 MG capsule Take 300 mg by mouth 2 (two) times daily. For dental appt  . colchicine 0.6 MG tablet Take 0.5 tablets by mouth 2 (two) times daily as needed. For gout flare ups  . estradiol (ESTRACE) 0.1 MG/GM vaginal cream Place 1 Applicatorful vaginally as needed (Estrace).   . furosemide (LASIX) 80 MG tablet Take 2 tablets by mouth 2 (two)  times daily.  . insulin aspart (NOVOLOG) 100 UNIT/ML injection Inject 30 Units into the skin 4 (four) times daily as needed. Use on a sliding scale  . Insulin Degludec (TRESIBA FLEXTOUCH) 200 UNIT/ML SOPN Inject 264 Units into the skin as directed. 100 units in the morning, 64 units at lunch, 100 units at night  . KLOR-CON M20 20 MEQ tablet Take 60 mEq by mouth 2 (two) times daily.   Marland Kitchen levothyroxine (SYNTHROID) 75 MCG tablet Take 75 mcg by mouth every morning.  Marland Kitchen levothyroxine (SYNTHROID, LEVOTHROID) 50 MCG tablet Take 50 mcg by mouth daily.   Marland Kitchen LYRICA 50 MG capsule Take 50-150 mg by mouth See admin instructions. Take 50 mg in the morning 50 mg at lunch and up to 150mg  at bedtime  . metolazone (ZAROXOLYN) 2.5 MG tablet Take 1 tablet (2.5 mg total) by mouth 3 (three) times a week. Mon, Wed, Fri  . metoprolol succinate (TOPROL-XL) 50 MG 24 hr tablet Take 1 tablet (50 mg total) by mouth 2 (two) times daily. Take with or immediately following a meal.  . nystatin (MYCOSTATIN/NYSTOP) powder Apply 1 application topically as needed.  . traMADol (ULTRAM) 50 MG tablet Take 50 mg by mouth 3 (three) times daily as needed.  . triamcinolone ointment (KENALOG) 0.1 % Apply 1 application topically as needed.  Marland Kitchen  ULORIC 80 MG TABS Take 1 tablet by mouth daily.  . Vitamin D, Ergocalciferol, (DRISDOL) 50000 UNITS CAPS Take 50,000 Units by mouth every 7 (seven) days. Takes on Thursday  . XARELTO 15 MG TABS tablet Take 15 mg by mouth daily with supper.    No current facility-administered medications for this visit. (Other)      REVIEW OF SYSTEMS: ROS    Positive for: Endocrine   Last edited by Tilda Franco on 08/31/2019  8:40 AM. (History)       ALLERGIES Allergies  Allergen Reactions  . Albuterol Other (See Comments)    REACTION: Tachycardia- AFib  . Epinephrine Other (See Comments)    Increases heart rate per patient.   . Penicillins Other (See Comments)    REACTION: flushing \\T \ hot  Did it  involve swelling of the face/tongue/throat, SOB, or low BP? No Did it involve sudden or severe rash/hives, skin peeling, or any reaction on the inside of your mouth or nose? No Did you need to seek medical attention at a hospital or doctor's office? No When did it last happen?20 years ago If all above answers are "NO", may proceed with cephalosporin use.  . Torsemide Swelling  . Bee Venom Other (See Comments)    "makes me nervous"  . Ivp Dye [Iodinated Diagnostic Agents] Other (See Comments)    flushing    PAST MEDICAL HISTORY Past Medical History:  Diagnosis Date  . Allergy   . Anxiety   . Arthritis   . Asthma   . Atrial fibrillation ([place])   . Cataract   . CHF (congestive heart failure) ([place])   . Chronic bronchitis ([place])   . Chronic kidney disease   . Clotting disorder ([place])   . Depression   . GERD (gastroesophageal reflux disease)   . High cholesterol   . History of hiatal hernia   . HTN (hypertension)   . Mild aortic sclerosis   . Morbid obesity ([place])   . Neuromuscular disorder ([place])   . Osteoporosis   . Persistent atrial fibrillation ([place])   . Scoliosis   . Spinal stenosis   . Thyroid disease   . Type II diabetes mellitus ([place])   . Vitamin D deficiency    Past Surgical History:  Procedure Laterality Date  . BLADDER SUSPENSION  1980s  . CARDIAC CATHETERIZATION  11/16/09   SMOOTH AND NORMAL  . CARDIOVERSION N/A 01/07/2015   Procedure: CARDIOVERSION;  Surgeon: Skeet Latch, MD;  Location[facility]COPY;  Service: Cardiovascular;  Laterality: N/A;  . CARDIOVERSION N/A 03/11/2015   Procedure: CARDIOVERSION;  Surgeon: Skeet Latch, MD;  Location: [facility];  Service: Cardiovascular;  Laterality: N/A;  . CARPAL TUNNEL RELEASE Left 1970s  . CATARACT EXTRACTION W/ INTRAOCULAR LENS  IMPLANT, BILATERAL Bilateral ~ 2010  . COLONOSCOPY  08/14/08  . FINGER FRACTURE SURGERY Right 1970s   "ring finger"  . FRACTURE SURGERY    . LAPAROSCOPIC CHOLECYSTECTOMY   1980s  . TUBAL LIGATION    . VAGINAL HYSTERECTOMY  1980    FAMILY HISTORY Family History  Problem Relation Age of Onset  . Stroke Father   . Hypertension Father   . Atrial fibrillation Father        HAD MURMUR  . Heart failure Mother   . Congestive Heart Failure Mother   . Hypertension Mother   . Hypertension Brother   . Hypertension Sister   . Hypertension Son   . CAD Sister  EARLY  . CAD Brother        EARLY  . Colon cancer Neg Hx   . Esophageal cancer Neg Hx   . Stomach cancer Neg Hx   . Rectal cancer Neg Hx     SOCIAL HISTORY Social History   Tobacco Use  . Smoking status: Never Smoker  . Smokeless tobacco: Never Used  Vaping Use  . Vaping Use: Never used  Substance Use Topics  . Alcohol use: No  . Drug use: No         OPHTHALMIC EXAM: Base Eye Exam    Visual Acuity (Snellen - Linear)      Right Left   Dist [place] 20/40 -2 20/20 -2   Dist ph [place] 20/25        Tonometry (Tonopen, 8:45 AM)      Right Left   Pressure 16 14       Pupils      Pupils Dark Light Shape React APD   Right PERRL 4 3 Round Brisk None   Left PERRL 4 3 Round Brisk None       Visual Fields (Counting fingers)      Left Right    Full Full       Neuro/Psych    Oriented x3: Yes   Mood/Affect: Normal       Dilation    Both eyes: 1.0% Mydriacyl, 2.5% Phenylephrine @ 8:45 AM        Slit Lamp and Fundus Exam    External Exam      Right Left   External Normal Normal       Slit Lamp Exam      Right Left   Lids/Lashes Normal Normal   Conjunctiva/Sclera White and quiet White and quiet   Cornea Clear Clear   Anterior Chamber Deep and quiet Deep and quiet   Iris Round and reactive Round and reactive   Lens Centered posterior chamber intraocular lens Centered posterior chamber intraocular lens   Anterior Vitreous Normal Normal       Fundus Exam      Right Left   Posterior Vitreous Normal Normal   Disc Normal Normal   C/D Ratio 0.3 0.3   Macula no macular  thickening, Microaneurysms no macular thickening, Microaneurysms   Vessels NPDR- Moderate NPDR- Moderate   Periphery Normal Normal          IMAGING AND PROCEDURES  Imaging and Procedures for 08/31/19  Color Fundus Photography Optos - OU - Both Eyes       Right Eye Progression has been stable. Disc findings include normal observations. Macula : microaneurysms. Periphery : normal observations.   Left Eye Progression has been stable. Disc findings include normal observations. Macula : microaneurysms. Periphery : normal observations.   Notes Moderate nonproliferative diabetic retinopathy OU stable as compared to prior examinations and photography.                ASSESSMENT/PLAN:  No problem-specific Assessment & Plan notes found for this encounter.      ICD-10-CM   1. 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 Moderate nonproliferative diabetic retinopathy of right eye associated with type 2 diabetes mellitus, macular edema presence unspecified (HCC)  A07.6226 Color Fundus Photography Optos - OU - Both Eyes    1.  2.  3.  Ophthalmic Meds Ordered  this visit:  No orders of the defined types were placed in this encounter.      No follow-ups on file.  There are no Patient Instructions on file for this visit.   Explained the diagnoses, plan, and follow up with the patient and they expressed understanding.  Patient expressed understanding of the importance of proper follow up care.   Clent Demark Mirel Hundal M.D. Diseases & Surgery of the Retina and Vitreous Retina & Diabetic [place] 08/31/19     Abbreviations: M myopia (nearsighted); A astigmatism; H hyperopia (farsighted); P presbyopia; Mrx spectacle prescription;  CTL contact lenses; OD right eye; OS left eye; OU both eyes  XT exotropia; ET esotropia;  PEK punctate epithelial keratitis; PEE punctate epithelial erosions; DES dry eye syndrome; MGD meibomian gland dysfunction; ATs artificial tears; PFAT's preservative free artificial tears; [place] nuclear sclerotic cataract; PSC posterior subcapsular cataract; ERM epi-retinal membrane; PVD posterior vitreous detachment; RD retinal detachment; DM diabetes mellitus; DR diabetic retinopathy; NPDR non-proliferative diabetic retinopathy; PDR proliferative diabetic retinopathy; CSME clinically significant macular edema; DME diabetic macular edema; dbh dot blot hemorrhages; CWS cotton wool spot; POAG primary open angle glaucoma; C/D cup-to-disc ratio; HVF humphrey visual field; GVF goldmann visual field; OCT optical coherence tomography; IOP intraocular pressure; BRVO Branch retinal vein occlusion; CRVO central retinal vein occlusion; CRAO central retinal artery occlusion; BRAO branch retinal artery occlusion; RT retinal tear; SB scleral buckle; PPV pars plana vitrectomy; VH Vitreous hemorrhage; PRP panretinal laser photocoagulation; IVK intravitreal kenalog; VMT vitreomacular traction; MH Macular hole;  NVD neovascularization of the disc; NVE neovascularization elsewhere; AREDS age related eye disease study; ARMD age related macular degeneration; POAG primary open angle glaucoma; EBMD epithelial/anterior basement membrane dystrophy; ACIOL anterior chamber intraocular lens; IOL intraocular lens; PCIOL posterior chamber intraocular lens; Phaco/IOL phacoemulsification with intraocular lens placement; [place] photorefractive keratectomy; LASIK laser assisted in situ keratomileusis; HTN hypertension; DM diabetes mellitus; COPD chronic obstructive pulmonary disease

## 2019-08-31 NOTE — Assessment & Plan Note (Signed)
The nature of moderate nonproliferative diabetic retinopathy was discussed with the patient as well as the need for more frequent follow up to judge for progression. Good blood glucose, blood pressure, and serum lipid control was recommended as well as avoidance of smoking and maintenance of normal weight.  Close follow up with PCP encouraged. °

## 2019-11-05 ENCOUNTER — Other Ambulatory Visit: Payer: Self-pay | Admitting: Cardiovascular Disease

## 2019-12-01 ENCOUNTER — Other Ambulatory Visit: Payer: Self-pay | Admitting: Orthopedic Surgery

## 2019-12-01 DIAGNOSIS — M25552 Pain in left hip: Secondary | ICD-10-CM

## 2019-12-01 DIAGNOSIS — M545 Low back pain, unspecified: Secondary | ICD-10-CM

## 2019-12-06 DIAGNOSIS — M545 Low back pain, unspecified: Secondary | ICD-10-CM | POA: Insufficient documentation

## 2019-12-23 ENCOUNTER — Ambulatory Visit
Admission: RE | Admit: 2019-12-23 | Discharge: 2019-12-23 | Disposition: A | Discharge status: Home / Self Care | Payer: Medicare HMO | Source: Ambulatory Visit | Attending: Orthopedic Surgery | Admitting: Orthopedic Surgery

## 2019-12-23 ENCOUNTER — Other Ambulatory Visit: Payer: Self-pay

## 2019-12-23 DIAGNOSIS — M545 Low back pain, unspecified: Secondary | ICD-10-CM

## 2019-12-23 DIAGNOSIS — M25552 Pain in left hip: Secondary | ICD-10-CM

## 2020-01-11 DIAGNOSIS — S32020A Wedge compression fracture of second lumbar vertebra, initial encounter for closed fracture: Secondary | ICD-10-CM | POA: Insufficient documentation

## 2020-03-04 ENCOUNTER — Encounter (INDEPENDENT_AMBULATORY_CARE_PROVIDER_SITE_OTHER): Payer: Self-pay | Admitting: Ophthalmology

## 2020-03-04 ENCOUNTER — Other Ambulatory Visit: Payer: Self-pay

## 2020-03-04 ENCOUNTER — Ambulatory Visit (INDEPENDENT_AMBULATORY_CARE_PROVIDER_SITE_OTHER): Payer: Medicare HMO | Admitting: Ophthalmology

## 2020-03-04 DIAGNOSIS — E113392 Type 2 diabetes mellitus with moderate nonproliferative diabetic retinopathy without macular edema, left eye: Secondary | ICD-10-CM | POA: Diagnosis not present

## 2020-03-04 DIAGNOSIS — H35043 Retinal micro-aneurysms, unspecified, bilateral: Secondary | ICD-10-CM | POA: Diagnosis not present

## 2020-03-04 DIAGNOSIS — E113391 Type 2 diabetes mellitus with moderate nonproliferative diabetic retinopathy without macular edema, right eye: Secondary | ICD-10-CM

## 2020-03-04 NOTE — Assessment & Plan Note (Signed)
The nature of moderate nonproliferative diabetic retinopathy was discussed with the patient as well as the need for more frequent follow up to judge for progression. Good blood glucose, blood pressure, and serum lipid control was recommended as well as avoidance of smoking and maintenance of normal weight.  Close follow up with PCP encouraged. °

## 2020-03-04 NOTE — Progress Notes (Signed)
03/04/2020     CHIEF COMPLAINT Patient presents for Retina Follow Up   HISTORY OF PRESENT ILLNESS: Kristine [name] is a 74 y.o. female who presents to the clinic today for:   HPI    Retina Follow Up    Patient presents with  Other.  In both eyes.  This started 6 months ago.  Duration of 6 months.          Comments    6 WK FU OU    Pt reports stable vision OU, maybe has to blink a little more, no new f/f, no pain or pressure.      Last A1C: 9.3  taken 10/2019     Last BS: 167 taken this AM        Last edited by Nichola Sizer D on 03/04/2020  9:49 AM. (History)      Referring physician: Prince Solian, MD [place],  [place] [address]  HISTORICAL INFORMATION:   Selected notes from the [facility]    Lab Results  Component Value Date   HGBA1C 8.8 (H) 01/03/2019     CURRENT MEDICATIONS: No current outpatient medications on file. (Ophthalmic Drugs)   No current facility-administered medications for this visit. (Ophthalmic Drugs)   Current Outpatient Medications (Other)  Medication Sig  . ALPRAZolam (XANAX) 0.25 MG tablet Take 0.125-0.25 mg by mouth 2 (two) times daily as needed for anxiety.   . BD PEN NEEDLE NANO U/F 32G X 4 MM MISC Inject 1 each as directed See admin instructions. Use pen needles with insulin pens daily  . ciclopirox (LOPROX) 0.77 % cream Apply 1 application topically 2 (two) times daily. For 4 weeks  . clindamycin (CLEOCIN) 300 MG capsule Take 300 mg by mouth 2 (two) times daily. For dental appt  . colchicine 0.6 MG tablet Take 0.5 tablets by mouth 2 (two) times daily as needed. For gout flare ups  . estradiol (ESTRACE) 0.1 MG/GM vaginal cream Place 1 Applicatorful vaginally as needed (Estrace).   . furosemide (LASIX) 80 MG tablet Take 2 tablets by mouth 2 (two) times daily.  . insulin aspart (NOVOLOG) 100 UNIT/ML injection Inject 30 Units into the skin 4 (four) times daily as needed. Use on a sliding  scale  . Insulin Degludec (TRESIBA FLEXTOUCH) 200 UNIT/ML SOPN Inject 264 Units into the skin as directed. 100 units in the morning, 64 units at lunch, 100 units at night  . KLOR-CON M20 20 MEQ tablet Take 60 mEq by mouth 2 (two) times daily.   Marland Kitchen levothyroxine (SYNTHROID) 75 MCG tablet Take 75 mcg by mouth every morning.  Marland Kitchen levothyroxine (SYNTHROID, LEVOTHROID) 50 MCG tablet Take 50 mcg by mouth daily.   Marland Kitchen LYRICA 50 MG capsule Take 50-150 mg by mouth See admin instructions. Take 50 mg in the morning 50 mg at lunch and up to 150mg  at bedtime  . metolazone (ZAROXOLYN) 2.5 MG tablet Take 1 tablet (2.5 mg total) by mouth 3 (three) times a week. Mon, Wed, Fri  . metoprolol succinate (TOPROL-XL) 50 MG 24 hr tablet TAKE 1 TABLET (50 MG TOTAL) BY MOUTH 2 (TWO) TIMES DAILY. TAKE WITH OR IMMEDIATELY FOLLOWING A MEAL.  Marland Kitchen nystatin (MYCOSTATIN/NYSTOP) powder Apply 1 application topically as needed.  . traMADol (ULTRAM) 50 MG tablet Take 50 mg by mouth 3 (three) times daily as needed.  . triamcinolone ointment (KENALOG) 0.1 % Apply 1 application topically as needed.  Marland Kitchen ULORIC 80 MG TABS Take 1 tablet by mouth  daily.  . Vitamin D, Ergocalciferol, (DRISDOL) 50000 UNITS CAPS Take 50,000 Units by mouth every 7 (seven) days. Takes on Thursday  . XARELTO 15 MG TABS tablet Take 15 mg by mouth daily with supper.    No current facility-administered medications for this visit. (Other)      REVIEW OF SYSTEMS:    ALLERGIES Allergies  Allergen Reactions  . Albuterol Other (See Comments)    REACTION: Tachycardia- AFib  . Epinephrine Other (See Comments)    Increases heart rate per patient.   . Penicillins Other (See Comments)    REACTION: flushing \\T \ hot  Did it involve swelling of the face/tongue/throat, SOB, or low BP? No Did it involve sudden or severe rash/hives, skin peeling, or any reaction on the inside of your mouth or nose? No Did you need to seek medical attention at a hospital or doctor's office?  No When did it last happen?20 years ago If all above answers are "NO", may proceed with cephalosporin use.  . Torsemide Swelling  . Bee Venom Other (See Comments)    "makes me nervous"  . Ivp Dye [Iodinated Diagnostic Agents] Other (See Comments)    flushing    PAST MEDICAL HISTORY Past Medical History:  Diagnosis Date  . Allergy   . Anxiety   . Arthritis   . Asthma   . Atrial fibrillation ([place])   . Cataract   . CHF (congestive heart failure) ([place])   . Chronic bronchitis ([place])   . Chronic kidney disease   . Clotting disorder ([place])   . Depression   . GERD (gastroesophageal reflux disease)   . High cholesterol   . History of hiatal hernia   . HTN (hypertension)   . Mild aortic sclerosis   . Morbid obesity ([place])   . Neuromuscular disorder ([place])   . Osteoporosis   . Persistent atrial fibrillation ([place])   . Scoliosis   . Spinal stenosis   . Thyroid disease   . Type II diabetes mellitus ([place])   . Vitamin D deficiency    Past Surgical History:  Procedure Laterality Date  . BLADDER SUSPENSION  1980s  . CARDIAC CATHETERIZATION  11/16/09   SMOOTH AND NORMAL  . CARDIOVERSION N/A 01/07/2015   Procedure: CARDIOVERSION;  Surgeon: Skeet Latch, MD;  Location: [facility] ENDOSCOPY;  Service: Cardiovascular;  Laterality: N/A;  . CARDIOVERSION N/A 03/11/2015   Procedure: CARDIOVERSION;  Surgeon: Skeet Latch, MD;  Location: [facility];  Service: Cardiovascular;  Laterality: N/A;  . CARPAL TUNNEL RELEASE Left 1970s  . CATARACT EXTRACTION W/ INTRAOCULAR LENS  IMPLANT, BILATERAL Bilateral ~ 2010  . COLONOSCOPY  08/14/08  . FINGER FRACTURE SURGERY Right 1970s   "ring finger"  . FRACTURE SURGERY    . LAPAROSCOPIC CHOLECYSTECTOMY  1980s  . TUBAL LIGATION    . VAGINAL HYSTERECTOMY  1980    FAMILY HISTORY Family History  Problem Relation Age of Onset  . Stroke Father   . Hypertension Father   . Atrial fibrillation Father        HAD MURMUR  . Heart failure Mother   .  Congestive Heart Failure Mother   . Hypertension Mother   . Hypertension Brother   . Hypertension Sister   . Hypertension Son   . CAD Sister        EARLY  . CAD Brother        EARLY  . Colon cancer Neg Hx   . Esophageal cancer Neg Hx   . Stomach cancer Neg Hx   .  Rectal cancer Neg Hx     SOCIAL HISTORY Social History   Tobacco Use  . Smoking status: Never Smoker  . Smokeless tobacco: Never Used  Vaping Use  . Vaping Use: Never used  Substance Use Topics  . Alcohol use: No  . Drug use: No         OPHTHALMIC EXAM:  Base Eye Exam    Visual Acuity (ETDRS)      Right Left   Dist [place] 20/40 20/20 -1   Dist ph [place] 20/20 -2        Tonometry (Tonopen, 9:55 AM)      Right Left   Pressure 15 15       Pupils      Pupils Dark Light Shape React APD   Right PERRL 4 3 Round Brisk None   Left PERRL 4 3 Round Brisk None       Visual Fields (Counting fingers)      Left Right    Full Full       Extraocular Movement      Right Left    Full Full       Neuro/Psych    Oriented x3: Yes   Mood/Affect: Normal       Dilation    Both eyes: 1.0% Mydriacyl, 2.5% Phenylephrine @ 9:55 AM        Slit Lamp and Fundus Exam    External Exam      Right Left   External Normal Normal       Slit Lamp Exam      Right Left   Lids/Lashes Normal Normal   Conjunctiva/Sclera White and quiet White and quiet   Cornea Clear Clear   Anterior Chamber Deep and quiet Deep and quiet   Iris Round and reactive Round and reactive   Lens Centered posterior chamber intraocular lens Centered posterior chamber intraocular lens   Anterior Vitreous Normal Normal       Fundus Exam      Right Left   Posterior Vitreous Normal Normal   Disc Normal Normal   C/D Ratio 0.4 0.5   Macula no macular thickening, Microaneurysms no macular thickening, Microaneurysms, no clinically significant macular edema   Vessels NPDR- Moderate NPDR- Moderate   Periphery Normal Normal          IMAGING AND  PROCEDURES  Imaging and Procedures for 03/04/20  OCT, Retina - OU - Both Eyes       Right Eye Quality was good. Scan locations included subfoveal. Central Foveal Thickness: 275.   Left Eye Quality was good. Scan locations included subfoveal. Central Foveal Thickness: 268.   Notes No active CSME OU will observe                ASSESSMENT/PLAN:  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 (HCC) The nature of moderate nonproliferative diabetic retinopathy was discussed with the patient as well as the need for more frequent follow up to judge for progression. Good blood glucose, blood pressure, and serum lipid control was recommended as well as avoidance of smoking and maintenance of normal weight.  Close follow up with PCP encouraged.  OU stable no progression  Moderate nonproliferative diabetic retinopathy of right eye (HCC) The nature of moderate nonproliferative diabetic retinopathy was discussed with the patient as well as the need for more frequent follow up to judge for progression. Good blood glucose, blood pressure, and serum lipid control was recommended as well as avoidance of smoking and maintenance  of normal weight.  Close follow up with PCP encouraged.      ICD-10-CM   1. Retinal microaneurysm of both eyes  H35.043 OCT, Retina - OU - Both Eyes  2. 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 Moderate nonproliferative diabetic retinopathy of right eye associated with type 2 diabetes mellitus, macular edema presence unspecified ([place])  Y40.3474     1.  Patient informed at the level of diabetic retinopathy in each eye is remained stable at moderate NPDR.  No progression has occurred.  2.  Sugar control is urged  3.  Ophthalmic Meds Ordered this visit:  No orders of the defined types were placed in this encounter.      Return in about  6 months (around 09/02/2020) for DILATE OU, COLOR FP, OCT.  There are no Patient Instructions on file for this visit.   Explained the diagnoses, plan, and follow up with the patient and they expressed understanding.  Patient expressed understanding of the importance of proper follow up care.   Clent Demark Rella Egelston M.D. Diseases & Surgery of the Retina and Vitreous Retina & Diabetic [place] 03/04/20     Abbreviations: M myopia (nearsighted); A astigmatism; H hyperopia (farsighted); P presbyopia; Mrx spectacle prescription;  CTL contact lenses; OD right eye; OS left eye; OU both eyes  XT exotropia; ET esotropia; PEK punctate epithelial keratitis; PEE punctate epithelial erosions; DES dry eye syndrome; MGD meibomian gland dysfunction; ATs artificial tears; PFAT's preservative free artificial tears; [place] nuclear sclerotic cataract; PSC posterior subcapsular cataract; ERM epi-retinal membrane; PVD posterior vitreous detachment; RD retinal detachment; DM diabetes mellitus; DR diabetic retinopathy; NPDR non-proliferative diabetic retinopathy; PDR proliferative diabetic retinopathy; CSME clinically significant macular edema; DME diabetic macular edema; dbh dot blot hemorrhages; CWS cotton wool spot; POAG primary open angle glaucoma; C/D cup-to-disc ratio; HVF humphrey visual field; GVF goldmann visual field; OCT optical coherence tomography; IOP intraocular pressure; BRVO Branch retinal vein occlusion; CRVO central retinal vein occlusion; CRAO central retinal artery occlusion; BRAO branch retinal artery occlusion; RT retinal tear; SB scleral buckle; PPV pars plana vitrectomy; VH Vitreous hemorrhage; PRP panretinal laser photocoagulation; IVK intravitreal kenalog; VMT vitreomacular traction; MH Macular hole;  NVD neovascularization of the disc; NVE neovascularization elsewhere; AREDS age related eye disease study; ARMD age related macular degeneration; POAG primary open angle glaucoma; EBMD epithelial/anterior  basement membrane dystrophy; ACIOL anterior chamber intraocular lens; IOL intraocular lens; PCIOL posterior chamber intraocular lens; Phaco/IOL phacoemulsification with intraocular lens placement; [place] photorefractive keratectomy; LASIK laser assisted in situ keratomileusis; HTN hypertension; DM diabetes mellitus; COPD chronic obstructive pulmonary disease

## 2020-03-04 NOTE — Assessment & Plan Note (Signed)
The nature of moderate nonproliferative diabetic retinopathy was discussed with the patient as well as the need for more frequent follow up to judge for progression. Good blood glucose, blood pressure, and serum lipid control was recommended as well as avoidance of smoking and maintenance of normal weight.  Close follow up with PCP encouraged.  OU stable no progression

## 2020-04-30 ENCOUNTER — Telehealth: Payer: Self-pay | Admitting: *Deleted

## 2020-04-30 NOTE — Telephone Encounter (Signed)
Patient with diagnosis of atrial fibrillation on Xarelto for anticoagulation.    Procedure: single tooth extraction  Date of procedure: TBD   CHA2DS2-VASc Score = 5  This indicates a 7.2% annual risk of stroke. The patient's score is based upon: CHF History: Yes HTN History: Yes Diabetes History: Yes Stroke History: No Vascular Disease History: No Age Score: 1 Gender Score: 1  CrCl 46.9 Platelet count 161  Patient does not require pre-op antibiotics for dental procedure.  Per office protocol we do not recommend holding anticoagulation for single tooth dental extractions.  However Dr. Acie Fredrickson commented in his last OV (06/29/2019) that patient would be okay to hold Xarelto for 2 days if required by dentist.

## 2020-04-30 NOTE — Telephone Encounter (Signed)
   [facility] Pre-operative Risk Assessment    HEARTCARE STAFF: - Please ensure there is not already an duplicate clearance open for this procedure. - Under Visit Info/Reason for Call, type in Other and utilize the format Clearance MM/DD/YY or Clearance TBD. Do not use dashes or single digits. - If request is for dental extraction, please clarify the # of teeth to be extracted.  Request for surgical clearance:  1. What type of surgery is being performed? CONFIRMED WITH DDS OFFICE 1 TOOTH TO BE EXTRACTED   2. When is this surgery scheduled? TBD   3. What type of clearance is required (medical clearance vs. Pharmacy clearance to hold med vs. Both)? BOTH  4. Are there any medications that need to be held prior to surgery and how long? [place]   5. Practice name and name of physician performing surgery? [facility]; DR. Tamela Oddi, DDS   6. What is the office phone number? [phone]   7.   What is the office fax number? [phone]  8.   Anesthesia type (None, local, MAC, general) ? IV SEDATION   Julaine Hua 04/30/2020, 9:16 AM  _________________________________________________________________   (provider comments below)

## 2020-04-30 NOTE — Telephone Encounter (Signed)
She is having IV sedation for surgical removal of one tooth at the surgery center.   PharmD - can you please give recommendations for one tooth extraction at the surgical center? We generally do not hold anticoagulation for one tooth, patient states that she will need to hold her xarelto.  Dr. Acie Fredrickson Patient can't complete 4.0 METS due to foot surgery last year. Low risk myoview 2017.  Do you recommend any additional testing prior to surgery?

## 2020-05-02 ENCOUNTER — Telehealth: Payer: Self-pay | Admitting: Physician Assistant

## 2020-05-02 ENCOUNTER — Telehealth: Payer: Self-pay

## 2020-05-02 NOTE — Telephone Encounter (Signed)
   Primary Cardiologist: Mertie Moores, MD  Chart reviewed as part of pre-operative protocol coverage.   Await reply from Dr. Nahser/covering colleague as requested in message below.  Charlie Pitter, PA-C 05/02/2020, 9:53 AM

## 2020-05-02 NOTE — Telephone Encounter (Signed)
   Primary Cardiologist: Mertie Moores, MD  Chart reviewed as part of pre-operative protocol coverage.   Per Dr. Elmarie Shiley input: "Pt is at low risk for dental extraction.  She does not need any further testing prior to procedure Our protocol is that patients do not typically need to hold Xarelto prior to a single dental extraction, however, if the dentist is requesting that she hold the xarelto,( perhaps the procedure is more complicated than we are assuming )  then she will be ok to hold for 1-2 days." Per chart review, patient does not have a history of heart valvular repair therefore SBE prophylaxis not indicated from cardiac standpoint.  Will route this bundled recommendation to requesting provider via Epic fax function. Please call with questions.   Charlie Pitter, PA-C 05/02/2020, 10:49 AM

## 2020-05-02 NOTE — Telephone Encounter (Signed)
Called to discuss with patient about COVID-19 symptoms and the use of one of the available treatments for those with mild to moderate Covid symptoms and at a high risk of hospitalization.  Pt appears to qualify for outpatient treatment due to co-morbid conditions and/or a member of an at-risk group in accordance with the FDA Emergency Use Authorization.   Unable to reach pt. LVMTCB.  Bhavinkumar Bhagat   

## 2020-05-02 NOTE — Telephone Encounter (Signed)
Pt is at low risk for dental extraction.  She does not need any further testing prior to procedure Our protocol is that patients do not typically need to hold Xarelto prior to a single dental extraction, however, if the dentist is requesting that she hold the xarelto,( perhaps the procedure is more complicated than we are assuming )  then she will be ok to hold for 1-2 days.    Mertie Moores, MD  05/02/2020 10:10 AM    [address],  [place], [place]  [address] Phone: [phone]; Fax: [phone]

## 2020-05-02 NOTE — Telephone Encounter (Signed)
Called to discuss with patient about COVID-19 symptoms and the use of one of the available treatments for those with mild to moderate Covid symptoms and at a high risk of hospitalization.  Pt appears to qualify for outpatient treatment due to co-morbid conditions and/or a member of an at-risk group in accordance with the FDA Emergency Use Authorization.    Symptom onset: 04/29/20 Vaccinated: Yes Booster? Yes Immunocompromised? No Qualifiers: CHF,DM,HTN  Pt. Would like to talk to APP about treatment.  Kristine [name]

## 2020-06-04 ENCOUNTER — Other Ambulatory Visit: Payer: Self-pay | Admitting: Internal Medicine

## 2020-06-04 DIAGNOSIS — Z1231 Encounter for screening mammogram for malignant neoplasm of breast: Secondary | ICD-10-CM

## 2020-06-24 ENCOUNTER — Other Ambulatory Visit: Payer: Self-pay | Admitting: Cardiovascular Disease

## 2020-06-24 DIAGNOSIS — I5042 Chronic combined systolic (congestive) and diastolic (congestive) heart failure: Secondary | ICD-10-CM

## 2020-07-16 ENCOUNTER — Other Ambulatory Visit: Payer: Self-pay

## 2020-07-16 ENCOUNTER — Ambulatory Visit: Payer: Medicare HMO | Admitting: Cardiovascular Disease

## 2020-07-16 VITALS — BP 158/80 | HR 90 | Ht 68.5 in | Wt 285.0 lb

## 2020-07-16 DIAGNOSIS — I5042 Chronic combined systolic (congestive) and diastolic (congestive) heart failure: Secondary | ICD-10-CM | POA: Diagnosis not present

## 2020-07-16 DIAGNOSIS — I5033 Acute on chronic diastolic (congestive) heart failure: Secondary | ICD-10-CM | POA: Diagnosis not present

## 2020-07-16 LAB — BASIC METABOLIC PANEL
BUN/Creatinine Ratio: 18 (ref 12–28)
BUN: 33 mg/dL — ABNORMAL HIGH (ref 8–27)
CO2: 27 mmol/L (ref 20–29)
Calcium: 10.1 mg/dL (ref 8.7–10.3)
Chloride: 99 mmol/L (ref 96–106)
Creatinine, Ser: 1.81 mg/dL — ABNORMAL HIGH (ref 0.57–1.00)
Glucose: 88 mg/dL (ref 65–99)
Potassium: 3.3 mmol/L — ABNORMAL LOW (ref 3.5–5.2)
Sodium: 143 mmol/L (ref 134–144)
eGFR: 29 mL/min/{1.73_m2} — ABNORMAL LOW (ref >59)

## 2020-07-16 LAB — LIPID PANEL
Chol/HDL Ratio: 4.2 ratio (ref 0.0–4.4)
Cholesterol, Total: 133 mg/dL (ref 100–199)
HDL: 32 mg/dL — ABNORMAL LOW (ref >39)
LDL Chol Calc (NIH): 74 mg/dL (ref 0–99)
Triglycerides: 156 mg/dL — ABNORMAL HIGH (ref 0–149)
VLDL Cholesterol Cal: 27 mg/dL (ref 5–40)

## 2020-07-16 LAB — ALT: ALT: 9 IU/L (ref 0–32)

## 2020-07-16 MED ORDER — FUROSEMIDE 80 MG PO TABS: 120.0000 mg | ORAL_TABLET | Freq: Two times a day (BID) | ORAL | 11 refills | Status: DC

## 2020-07-16 NOTE — Progress Notes (Signed)
Cardiology Office Note   Date:  07/16/2020   ID:  Kristine [name], DOB April 28, 1945, MRN [id number]  PCP:  Prince Solian, MD  Cardiologist:   Mertie Moores, MD   Chief Complaint  Patient presents with  . Atrial Fibrillation   1. Atrial fibrillation 2. Diabetes mellitus 3. Chronic diastolic CHF 4. Obesity.      Kristine [name] is a 75 year old female with a history of atrial fibrillation. She has done well since I last saw her. She's not had any episodes of chest pain or shortness breath. Her blood pressure readings have been normal.  She is tolerating the Xarelto fairly well. She has some occasional bleeding from the gums. She enjoys not having to check her INR levels.   She has occasional palpitations and heart racing. This typically can rest and these will resolve. She is able to wak about 10 minutes at a time.   Oct. 24, 2014:  June 23, 2013:  Kristine [name]'s BP is elevated. She had a recent echocardiogram that revealed a left ventricle systolic function that was at the lower limits of normal-50%. Study Conclusions  - Left ventricle: The cavity size was normal. Wall thickness was increased in a pattern of mild LVH. The estimated ejection fraction was 50%. Diffuse hypokinesis. Features are consistent with a pseudonormal left ventricular filling pattern, with concomitant abnormal relaxation and increased filling pressure (grade 2 diastolic dysfunction). - Aortic valve: There was no stenosis. - Mitral valve: Mildly calcified annulus. Mildly calcified leaflets . Trivial regurgitation. - Left atrium: The atrium was moderately dilated. - Right ventricle: The cavity size was normal. Systolic function was mildly reduced. - Right atrium: The atrium was moderately dilated. - Systemic veins: IVC measured 1.7 cm with < 50% respirophasic variation, suggesting RA pressure 8 mmHg. - Pericardium, extracardiac: A trivial pericardial effusion was identified posterior to the  heart  She was having some abdominal pain and   September 29, 2013:  Kristine [name] is doing well. Stressed about coming here.   Jan. 7, 2015:  Kristine [name] is a 75 yo with hx of Afib and diastolic dysfunction She converted to NSR in the hospital with flecainide. She was tried on [place] but her QT interval prolonged. Was changed to Flecainide and has maintained NSR.  She is exercising regularly - 30 minutes a day. Is on Lasix 80 mg a day and mtalazone 2. 5 once a week. Is still retaining fluid. Trying to avoid salt in food.    June 11, 2014:   Kristine [name] is a 75 y.o. female who presents for follow-up of her atrial fibrillation. She has had some issues with fluid retention and  has been on metalazone.   Sept. 19, 2016:  Doing well.  Has had some shortness of breath - improved with increasing lasix to 80 BID .  BP is still high this am.  BP at home has been normal .  June 18, 2015:  She has tried [place] but had prolonged QT - so it was stopped Tried on flecainide Was tried on amiodarone  Had a cardioversion in Dec.   Amiodarone dose was lowered to 200 mg a day and she is now back in atrial fib She is not as symptomatic today  She is very fatigued. Has not been sleeping well at all Has lots of arm fatigue in her arms with any activity   Nov. 2, 2017:    Still short of breath No CP  Active,  Has a pins and needle sensation  August 27, 2016:  Kristine [name] is seen back today for follow up of her atrial fib  Has failed Flecainide, tikosyn and now  Is on amiodarone.   We had her on a reduced dose of amiodarone due to symptoms of peripheral neuropathy. The dose was increased up to 200 mg daily by Dr. Rayann Heman.  She is tentatively scheduled for cardioversion. She does not want to consider an ablation .   February 25, 2017:  Kristine [name] is seen back today for her chronic atrial fibrillation. She has discontinued the amiodarone.  She seems to be tolerating the atrial fibrillation quite  well.  We have adopted a strategy of anticoagulation and rate control.  She developed acute on chronic diastolic CHF last Thursday.   She has been on Lyrica  Went to the ER.   She took an extra Lasix on the way there and urinated all of the extra weight  She still takes metolazone very rarely - perhaps   Aug 06, 2017:  Had some weakness while coming in this afternoon.    Had fasted all day - until 2 PM  Feeling better.  Breathing is good   She is had lots of leg edema.  She has been seeing Dr. [name].  She now takes Lasix 80 mg 3 times a day.  She takes metolazone 2.5 mg 3 days a week.  February 01, 2018  Is having problem of her Charcot foot.  Here for pre op visit Echo in Oct. Showed EF of 45-50%.    She has grade 2 diastolic dysfunction by previous echo .  Moderate pulmonary HTN.  June 29, 2019:  Kristine [name] is seen today for follow-up of her chronic combined systolic and diastolic congestive heart failure.  Echocardiogram showed an EF of 45 to 50%.  She has grade 2 diastolic dysfunction.  She has moderate pulmonary hypertension.   Walks with a walker,  Balance issues from charcot foot issues  [place] and injured her left knee 2 months ago .  No cardiac complaints.  Has lots of neurophathyu  -  Foot pain and hand pain . Breathing is good,  No CP  Lipids are reviewed.  Still eating  Salty foods .    July 16, 2020: Has 12 lb weight gain,  Leg edema,  Shortness of breath  Still eating salty foods almost daily .  Takes Lasix  80 mg bid  Is still making lots of urine,  Also takes metolazone 2.5 on M W F Echo in 2020 shows mildly reduced LV function with EF 45-50%. unmable to determine diastolic function due to Af ib   Creatinine is 2.  Not sure we can add entresto or ARB  Is volume overloaded. Still eatis salt     Past Medical History:  Diagnosis Date  . Allergy   . Anxiety   . Arthritis   . Asthma   . Atrial fibrillation ([place])   . Cataract   . CHF (congestive heart  failure) ([place])   . Chronic bronchitis ([place])   . Chronic kidney disease   . Clotting disorder ([place])   . Depression   . GERD (gastroesophageal reflux disease)   . High cholesterol   . History of hiatal hernia   . HTN (hypertension)   . Mild aortic sclerosis   . Morbid obesity ([employer])   . Neuromuscular disorder ([place])   . Osteoporosis   . Persistent atrial fibrillation ([place])   . Scoliosis   . Spinal stenosis   . Thyroid  disease   . Type II diabetes mellitus ([place])   . Vitamin D deficiency     Past Surgical History:  Procedure Laterality Date  . BLADDER SUSPENSION  1980s  . CARDIAC CATHETERIZATION  11/16/09   SMOOTH AND NORMAL  . CARDIOVERSION N/A 01/07/2015   Procedure: CARDIOVERSION;  Surgeon: Skeet Latch, MD;  Location: [facility] ENDOSCOPY;  Service: Cardiovascular;  Laterality: N/A;  . CARDIOVERSION N/A 03/11/2015   Procedure: CARDIOVERSION;  Surgeon: Skeet Latch, MD;  Location: [facility];  Service: Cardiovascular;  Laterality: N/A;  . CARPAL TUNNEL RELEASE Left 1970s  . CATARACT EXTRACTION W/ INTRAOCULAR LENS  IMPLANT, BILATERAL Bilateral ~ 2010  . COLONOSCOPY  08/14/08  . FINGER FRACTURE SURGERY Right 1970s   "ring finger"  . FRACTURE SURGERY    . LAPAROSCOPIC CHOLECYSTECTOMY  1980s  . TUBAL LIGATION    . VAGINAL HYSTERECTOMY  1980     Current Outpatient Medications  Medication Sig Dispense Refill  . ALPRAZolam (XANAX) 0.25 MG tablet Take 0.125-0.25 mg by mouth 2 (two) times daily as needed for anxiety.   0  . BD PEN NEEDLE NANO U/F 32G X 4 MM MISC Inject 1 each as directed See admin instructions. Use pen needles with insulin pens daily  11  . ciclopirox (LOPROX) 0.77 % cream Apply 1 application topically 2 (two) times daily. For 4 weeks    . clindamycin (CLEOCIN) 300 MG capsule Take 300 mg by mouth 2 (two) times daily. For dental appt    . colchicine 0.6 MG tablet Take 0.5 tablets by mouth 2 (two) times daily as needed. For gout flare ups    . estradiol (ESTRACE)  0.1 MG/GM vaginal cream Place 1 Applicatorful vaginally as needed (Estrace).     . furosemide (LASIX) 80 MG tablet Take 2 tablets by mouth 2 (two) times daily.    . insulin aspart (NOVOLOG) 100 UNIT/ML injection Inject 30 Units into the skin 4 (four) times daily as needed. Use on a sliding scale    . Insulin Degludec (TRESIBA FLEXTOUCH) 200 UNIT/ML SOPN Inject 264 Units into the skin as directed. 100 units in the morning, 64 units at lunch, 100 units at night    . KLOR-CON M20 20 MEQ tablet Take 60 mEq by mouth 2 (two) times daily.     Marland Kitchen levothyroxine (SYNTHROID) 75 MCG tablet Take 75 mcg by mouth every morning.    Marland Kitchen LYRICA 50 MG capsule Take 50-150 mg by mouth See admin instructions. Take 50 mg in the morning 50 mg at lunch and up to 150mg  at bedtime  3  . metolazone (ZAROXOLYN) 2.5 MG tablet Take 1 tablet (2.5 mg total) by mouth 3 (three) times a week. Mon, Wed, Fri 15 tablet 10  . metoprolol succinate (TOPROL-XL) 50 MG 24 hr tablet TAKE ONE TABLET BY MOUTH TWICE DAILY take with or immediately following a meal 30 tablet 0  . nystatin (MYCOSTATIN/NYSTOP) powder Apply 1 application topically as needed.    . traMADol (ULTRAM) 50 MG tablet Take 50 mg by mouth 3 (three) times daily as needed.    . triamcinolone ointment (KENALOG) 0.1 % Apply 1 application topically as needed.    Marland Kitchen ULORIC 80 MG TABS Take 1 tablet by mouth daily.  3  . Vitamin D, Ergocalciferol, (DRISDOL) 50000 UNITS CAPS Take 50,000 Units by mouth every 7 (seven) days. Takes on Thursday    . XARELTO 15 MG TABS tablet Take 15 mg by mouth daily with supper.  No current facility-administered medications for this visit.    Allergies:   Albuterol, Epinephrine, Penicillins, Torsemide, Bee venom, and Ivp dye [iodinated diagnostic agents]    Social History:  The patient  reports that she has never smoked. She has never used smokeless tobacco. She reports that she does not drink alcohol and does not use drugs.   Family History:  The  patient's family history includes Atrial fibrillation in her father; CAD in her brother and sister; Congestive Heart Failure in her mother; Heart failure in her mother; Hypertension in her brother, father, mother, sister, and son; Stroke in her father.    ROS:   Noted in current history, otherwise review of systems is negative.  Physical Exam: Blood pressure (!) 158/80, pulse 90, height 5' 8.5" (1.74 m), weight 285 lb (129.3 kg), SpO2 96 %.  GEN:  Well nourished, well developed in no acute distress HEENT: Normal NECK: No JVD; No carotid bruits LYMPHATICS: No lymphadenopathy CARDIAC: irreg. Irreg.  RESPIRATORY:  Clear to auscultation without rales, wheezing or rhonchi  ABDOMEN: Soft, non-tender, non-distended MUSCULOSKELETAL:  No edema; No deformity  SKIN: Warm and dry NEUROLOGIC:  Alert and oriented x 3   EKG:  July 16, 2020:   Afib with controlled  V response     Recent Labs: No results found for requested labs within last 8760 hours.    Lipid Panel    Component Value Date/Time   CHOL 123 06/04/2018 0535   CHOL 180 08/06/2017 1354   TRIG 105 06/04/2018 0535   HDL 25 (L) 06/04/2018 0535   HDL 34 (L) 08/06/2017 1354   CHOLHDL 4.9 06/04/2018 0535   VLDL 21 06/04/2018 0535   LDLCALC 77 06/04/2018 0535   LDLCALC 91 08/06/2017 1354      Wt Readings from Last 3 Encounters:  07/16/20 285 lb (129.3 kg)  06/29/19 287 lb 12 oz (130.5 kg)  03/31/19 273 lb (123.8 kg)      Other studies Reviewed: Additional studies/ records that were reviewed today include: . Review of the above records demonstrates:    ASSESSMENT AND PLAN:  1. Atrial fibrillation -      2.  . Chronic combined systolic and diastolic congestive heart failure.  Last echocardiogram showed an ejection fraction 45 to 50%.  She has likely diastolic dysfunction but were not able to actually diagnose that because of her atrial fibrillation.  Her creatinine is around 2.  We have been unable to add ARB or  Entresto.  She has been on relatively high-dose Lasix but we will increase the Lasix to 120 mg twice a day.  Continue metolazone 2.5 mg on Mondays, Wednesdays, Fridays.  At this point I do not have much more to add.  We will refer her to our advanced heart failure clinic for further recommendations and follow-up.:     3. Obesity.     Advised weight loss   4.  CKD   -     5.  Charcot foot:  .  6.  Dental :  She needs a root canal.   Will wait and hear back from the dentist.  It will be ok for her to hold xarelto for 2 days if the dentist requests.    Current medicines are reviewed at length with the patient today.  The patient does not have concerns regarding medicines.  The following changes have been made:  no change  Labs/ tests ordered today include:   No orders of the defined types were  placed in this encounter.   Disposition:   FU with CHF clinic   Signed, Mertie Moores, MD  07/16/2020 11:43 AM    [facility] [address], [place], [place]  [address] Phone: [phone]; Fax: [phone]

## 2020-07-16 NOTE — Patient Instructions (Addendum)
Medication Instructions:  Your physician has recommended you make the following change in your medication:   INCREASE Lasix to 120mg  twice a day  *If you need a refill on your cardiac medications before your next appointment, please call your pharmacy*   Lab Work: TODAY: Lipids, alt, bmet Your physician recommends that you return for lab work in: 3 weeks (BMET)   Testing/Procedures: Your physician has requested that you have an echocardiogram. Echocardiography is a painless test that uses sound waves to create images of your heart. It provides your doctor with information about the size and shape of your heart and how well your heart's chambers and valves are working. This procedure takes approximately one hour. There are no restrictions for this procedure.    Follow-Up: At [facility], you and your health needs are our priority.  As part of our continuing mission to provide you with exceptional heart care, we have created designated Provider Care Teams.  These Care Teams include your primary Cardiologist (physician) and Advanced Practice Providers (APPs -  Physician Assistants and Nurse Practitioners) who all work together to provide you with the care you need, when you need it.  Your next appointment:   Follow-up with the heart failure clinic    Other Instructions You have been referred to see the heart failure clinic

## 2020-07-17 ENCOUNTER — Telehealth: Payer: Self-pay | Admitting: Cardiovascular Disease

## 2020-07-17 DIAGNOSIS — E876 Hypokalemia: Secondary | ICD-10-CM

## 2020-07-17 MED ORDER — SPIRONOLACTONE 25 MG PO TABS: 25.0000 mg | ORAL_TABLET | Freq: Every day | ORAL | 3 refills | Status: DC

## 2020-07-17 NOTE — Telephone Encounter (Signed)
    Pt is returning call to get lab results 

## 2020-07-17 NOTE — Telephone Encounter (Signed)
-----   Message from Thayer Headings, MD sent at 07/16/2020  5:21 PM EDT ----- Potassium remains low. She is on high dose lasix and also metolazone 2.5 mg on M,W,F. Please add spironolactone 25 mg a day to help with hypokalemia  Please make sure we have a BMP ordered for 2-3 weeks.

## 2020-07-17 NOTE — Telephone Encounter (Signed)
Spoke with April at [facility] in regards to patient medications: furosemide and metolazone.  I clarified with her patient updated prescription for furosemide 120 mg PO BID changed on 07/16/20.  She did not have record of change.  Prescription was sent to [facility] April will call and get medication switched over.  No changes were made to metolazone.  She verbalizes understanding.

## 2020-07-17 NOTE — Telephone Encounter (Signed)
New Message:   She wants to know if  Any changes have been made in these medicines Furosemide and Metolazone, before they mail them?

## 2020-07-17 NOTE — Telephone Encounter (Signed)
Called patient reviewed results and MD recommendations.  Spelled out name and doseage of spironolactone for patient. Sent to [facility] pharmacy per patient request.  She will have labs drawn at a [place] facility near her home.  She is aware to have labs drawn between May 12-19, 2022.  She verbalizes understanding.

## 2020-07-24 LAB — BASIC METABOLIC PANEL
BUN/Creatinine Ratio: 21 (ref 12–28)
BUN: 41 mg/dL — ABNORMAL HIGH (ref 8–27)
CO2: 26 mmol/L (ref 20–29)
Calcium: 9.8 mg/dL (ref 8.7–10.3)
Chloride: 96 mmol/L (ref 96–106)
Creatinine, Ser: 1.98 mg/dL — ABNORMAL HIGH (ref 0.57–1.00)
Glucose: 148 mg/dL — ABNORMAL HIGH (ref 65–99)
Potassium: 4.4 mmol/L (ref 3.5–5.2)
Sodium: 142 mmol/L (ref 134–144)
eGFR: 26 mL/min/{1.73_m2} — ABNORMAL LOW (ref >59)

## 2020-08-08 ENCOUNTER — Ambulatory Visit (INDEPENDENT_AMBULATORY_CARE_PROVIDER_SITE_OTHER): Payer: Medicare HMO

## 2020-08-08 ENCOUNTER — Other Ambulatory Visit: Payer: Self-pay

## 2020-08-08 DIAGNOSIS — I5042 Chronic combined systolic (congestive) and diastolic (congestive) heart failure: Secondary | ICD-10-CM | POA: Diagnosis not present

## 2020-08-08 LAB — ECHOCARDIOGRAM COMPLETE
Area-P 1/2: 5.15 cm2
Calc EF: 35.6 %
MV M vel: 4.97 m/s
MV Peak grad: 98.8 mmHg
S' Lateral: 3.85 cm
Single Plane A2C EF: 33 %
Single Plane A4C EF: 35.3 %

## 2020-08-12 ENCOUNTER — Ambulatory Visit: Payer: Medicare HMO | Admitting: Family

## 2020-08-21 ENCOUNTER — Ambulatory Visit (HOSPITAL_COMMUNITY)
Admission: RE | Admit: 2020-08-21 | Discharge: 2020-08-21 | Disposition: A | Discharge status: Home / Self Care | Payer: Medicare HMO | Source: Ambulatory Visit | Attending: Internal Medicine | Admitting: Internal Medicine

## 2020-08-21 ENCOUNTER — Encounter (HOSPITAL_BASED_OUTPATIENT_CLINIC_OR_DEPARTMENT_OTHER): Payer: Medicare HMO | Admitting: Cardiology

## 2020-08-21 ENCOUNTER — Telehealth: Payer: Self-pay

## 2020-08-21 ENCOUNTER — Other Ambulatory Visit: Payer: Self-pay

## 2020-08-21 ENCOUNTER — Encounter (HOSPITAL_COMMUNITY): Payer: Self-pay | Admitting: Internal Medicine

## 2020-08-21 DIAGNOSIS — J449 Chronic obstructive pulmonary disease, unspecified: Secondary | ICD-10-CM | POA: Diagnosis not present

## 2020-08-21 DIAGNOSIS — Z79899 Other long term (current) drug therapy: Secondary | ICD-10-CM | POA: Diagnosis not present

## 2020-08-21 DIAGNOSIS — I5042 Chronic combined systolic (congestive) and diastolic (congestive) heart failure: Secondary | ICD-10-CM | POA: Diagnosis present

## 2020-08-21 DIAGNOSIS — Z6841 Body Mass Index (BMI) 40.0 and over, adult: Secondary | ICD-10-CM | POA: Insufficient documentation

## 2020-08-21 DIAGNOSIS — G4733 Obstructive sleep apnea (adult) (pediatric): Secondary | ICD-10-CM | POA: Diagnosis not present

## 2020-08-21 DIAGNOSIS — I130 Hypertensive heart and chronic kidney disease with heart failure and stage 1 through stage 4 chronic kidney disease, or unspecified chronic kidney disease: Secondary | ICD-10-CM | POA: Insufficient documentation

## 2020-08-21 DIAGNOSIS — N184 Chronic kidney disease, stage 4 (severe): Secondary | ICD-10-CM | POA: Insufficient documentation

## 2020-08-21 DIAGNOSIS — I272 Pulmonary hypertension, unspecified: Secondary | ICD-10-CM

## 2020-08-21 DIAGNOSIS — Z794 Long term (current) use of insulin: Secondary | ICD-10-CM | POA: Insufficient documentation

## 2020-08-21 DIAGNOSIS — Z7901 Long term (current) use of anticoagulants: Secondary | ICD-10-CM | POA: Diagnosis not present

## 2020-08-21 DIAGNOSIS — E7800 Pure hypercholesterolemia, unspecified: Secondary | ICD-10-CM | POA: Diagnosis not present

## 2020-08-21 DIAGNOSIS — R0683 Snoring: Secondary | ICD-10-CM | POA: Diagnosis not present

## 2020-08-21 DIAGNOSIS — I5032 Chronic diastolic (congestive) heart failure: Secondary | ICD-10-CM | POA: Diagnosis not present

## 2020-08-21 DIAGNOSIS — I4821 Permanent atrial fibrillation: Secondary | ICD-10-CM | POA: Diagnosis not present

## 2020-08-21 DIAGNOSIS — I4819 Other persistent atrial fibrillation: Secondary | ICD-10-CM | POA: Diagnosis not present

## 2020-08-21 DIAGNOSIS — Z8249 Family history of ischemic heart disease and other diseases of the circulatory system: Secondary | ICD-10-CM | POA: Insufficient documentation

## 2020-08-21 DIAGNOSIS — E1122 Type 2 diabetes mellitus with diabetic chronic kidney disease: Secondary | ICD-10-CM | POA: Diagnosis not present

## 2020-08-21 DIAGNOSIS — I313 Pericardial effusion (noninflammatory): Secondary | ICD-10-CM | POA: Diagnosis not present

## 2020-08-21 NOTE — Progress Notes (Signed)
ADVANCED HF CLINIC CONSULT NOTE  Referring Physician: Mertie Moores, MD Primary Care: Prince Solian, MD Primary Cardiologist: Mertie Moores, MD Nephrologist: Dr. Joelyn Oms  HPI:  Kristine [name] is a 75 y/o woman with morbid obesity, DM2 c/b Charcot foot, HTN, chronic atrial fibrillation, CKD IV referred by Dr. Acie Fredrickson for further evaluation of HF.   She has a long h/o of diastolic HF and AF. Has been on multiple AADs but now has chronic AF. Echo 4/15 EF 50% mild LVH grade 2 DD. RV mildly reduced   Recently struggling with more volume overload despite increasing doses of diuretics.   Echo 5/22 EF 45-50%. RV severely reduced RVSP 55. D-shaped septum. Severe biatrial enlargement . Moderate pericardial effusion   She is here with her daughter. She has had DM2 for > 20 years. Says she has been struggling with her fluid for more than a year. Now on lasix 120mg  in the am and in the afternoon either takes 80 or 160. Takes metolazone 2.5mg  MWF. Not very mobile. Gets around with a walker. Her daughter or husband does all the cooking and cleaning. Weight was 123 pounds when she graduated HS. Drinks a lot of water and likes to eat ice.   Has been suggested she get tested for sleep apnea but never went through with it (follows with Dr. Elsworth Soho and Neurology as well).   Has been on torsemide in past but had hand and face swelling.    Review of Systems: [y] = yes, [ ]  = no   General: Weight gain [ ] ; Weight loss [ ] ; Anorexia [ ] ; Fatigue [ ] ; Fever [ ] ; Chills [ ] ; Weakness Blue.Reese ]  Cardiac: Chest pain/pressure [ ] ; Resting SOB Blue.Reese ]; Exertional SOB Blue.Reese ]; Orthopnea [ ] ; Pedal Edema [ y]; Palpitations [ ] ; Syncope [ ] ; Presyncope [ ] ; Paroxysmal nocturnal dyspnea[ ]   Pulmonary: Cough [ ] ; Wheezing[ ] ; Hemoptysis[ ] ; Sputum [ ] ; Snoring [ ]   GI: Vomiting[ ] ; Dysphagia[ ] ; Melena[ ] ; Hematochezia [ ] ; Heartburn[ ] ; Abdominal pain [ ] ; Constipation [ ] ; Diarrhea [ ] ; BRBPR [ ]   GU: Hematuria[ ] ; Dysuria [ ] ;  Nocturia[ ]   Vascular: Pain in legs with walking [ ] ; Pain in feet with lying flat [ ] ; Non-healing sores [ y]; Stroke [ ] ; TIA [ ] ; Slurred speech [ ] ;  Neuro: Headaches[ ] ; Vertigo[ ] ; Seizures[ ] ; Paresthesias[ ] ;Blurred vision [ ] ; Diplopia [ ] ; Vision changes [ ]   Ortho/Skin: Arthritis Blue.Reese ]; Joint pain Blue.Reese ]; Muscle pain [ ] ; Joint swelling [ ] ; Back Pain [ ] ; Rash [ ]   Psych: Depression[ y]; Anxiety[ ]   Heme: Bleeding problems [ ] ; Clotting disorders [ ] ; Anemia [ ]   Endocrine: Diabetes Blue.Reese ]; Thyroid dysfunction[ ]    Past Medical History:  Diagnosis Date  . Allergy   . Anxiety   . Arthritis   . Asthma   . Atrial fibrillation ([place])   . Cataract   . CHF (congestive heart failure) ([place])   . Chronic bronchitis ([place])   . Chronic kidney disease   . Clotting disorder ([place])   . Depression   . GERD (gastroesophageal reflux disease)   . High cholesterol   . History of hiatal hernia   . HTN (hypertension)   . Mild aortic sclerosis   . Morbid obesity ([place])   . Neuromuscular disorder ([place])   . Osteoporosis   . Persistent atrial fibrillation ([place])   . Scoliosis   . Spinal stenosis   .  Thyroid disease   . Type II diabetes mellitus ([place])   . Vitamin D deficiency     Current Outpatient Medications  Medication Sig Dispense Refill  . ALPRAZolam (XANAX) 0.25 MG tablet Take 0.125-0.25 mg by mouth 2 (two) times daily as needed for anxiety.   0  . BD PEN NEEDLE NANO U/F 32G X 4 MM MISC Inject 1 each as directed See admin instructions. Use pen needles with insulin pens daily  11  . ciclopirox (LOPROX) 0.77 % cream Apply 1 application topically 2 (two) times daily. For 4 weeks    . clindamycin (CLEOCIN) 300 MG capsule Take 300 mg by mouth 2 (two) times daily. For dental appt    . colchicine 0.6 MG tablet Take 0.5 tablets by mouth 2 (two) times daily as needed. For gout flare ups    . estradiol (ESTRACE) 0.1 MG/GM vaginal cream Place 1 Applicatorful vaginally as needed (Estrace).     .  furosemide (LASIX) 80 MG tablet Take 1.5 tablets (120 mg total) by mouth 2 (two) times daily. 90 tablet 11  . insulin aspart (NOVOLOG) 100 UNIT/ML injection Inject 30 Units into the skin 4 (four) times daily as needed. Use on a sliding scale    . Insulin Degludec (TRESIBA FLEXTOUCH) 200 UNIT/ML SOPN Inject 264 Units into the skin as directed. 100 units in the morning, 64 units at lunch, 100 units at night    . KLOR-CON M20 20 MEQ tablet Take 60 mEq by mouth 2 (two) times daily.     Marland Kitchen levothyroxine (SYNTHROID) 75 MCG tablet Take 75 mcg by mouth every morning.    Marland Kitchen LYRICA 50 MG capsule Take 50-150 mg by mouth See admin instructions. Take 50 mg in the morning 50 mg at lunch and up to 150mg  at bedtime  3  . metolazone (ZAROXOLYN) 2.5 MG tablet Take 1 tablet (2.5 mg total) by mouth 3 (three) times a week. Mon, Wed, Fri 15 tablet 10  . metoprolol succinate (TOPROL-XL) 50 MG 24 hr tablet TAKE ONE TABLET BY MOUTH TWICE DAILY take with or immediately following a meal 30 tablet 0  . nystatin (MYCOSTATIN/NYSTOP) powder Apply 1 application topically as needed.    Marland Kitchen spironolactone (ALDACTONE) 25 MG tablet Take 1 tablet (25 mg total) by mouth daily. 90 tablet 3  . traMADol (ULTRAM) 50 MG tablet Take 50 mg by mouth 3 (three) times daily as needed.    . triamcinolone ointment (KENALOG) 0.1 % Apply 1 application topically as needed.    Marland Kitchen ULORIC 80 MG TABS Take 1 tablet by mouth daily.  3  . Vitamin D, Ergocalciferol, (DRISDOL) 50000 UNITS CAPS Take 50,000 Units by mouth every 7 (seven) days. Takes on Thursday    . XARELTO 15 MG TABS tablet Take 15 mg by mouth daily with supper.      No current facility-administered medications for this encounter.    Allergies  Allergen Reactions  . Albuterol Other (See Comments)    REACTION: Tachycardia- AFib  . Epinephrine Other (See Comments)    Increases heart rate per patient.   . Penicillins Other (See Comments)    REACTION: flushing \\T \ hot  Did it involve swelling  of the face/tongue/throat, SOB, or low BP? No Did it involve sudden or severe rash/hives, skin peeling, or any reaction on the inside of your mouth or nose? No Did you need to seek medical attention at a hospital or doctor's office? No When did it last happen?20 years ago If all  above answers are "NO", may proceed with cephalosporin use.  . Tizanidine Other (See Comments)    lethargic  . Torsemide Swelling  . Bee Venom Other (See Comments)    "makes me nervous"  . Ivp Dye [Iodinated Diagnostic Agents] Other (See Comments)    flushing      Social History   Socioeconomic History  . Marital status: Married    Spouse name: Not on file  . Number of children: 4  . Years of education: 7  . Highest education level: Not on file  Occupational History  . Not on file  Tobacco Use  . Smoking status: Never Smoker  . Smokeless tobacco: Never Used  Vaping Use  . Vaping Use: Never used  Substance and Sexual Activity  . Alcohol use: No  . Drug use: No  . Sexual activity: Yes  Other Topics Concern  . Not on file  Social History Narrative   Pt lives in [place] with spouse.   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   Right-handed.   1 cup caffeine per day.   Social Determinants of Health   Financial Resource Strain: Not on file  Food Insecurity: Not on file  Transportation Needs: Not on file  Physical Activity: Not on file  Stress: Not on file  Social Connections: Not on file  Intimate Partner Violence: Not on file      Family History  Problem Relation Age of Onset  . Stroke Father   . Hypertension Father   . Atrial fibrillation Father        HAD MURMUR  . Heart failure Mother   . Congestive Heart Failure Mother   . Hypertension Mother   . Hypertension Brother   . Hypertension Sister   . Hypertension Son   . CAD Sister        EARLY  . CAD Brother        EARLY  .  Colon cancer Neg Hx   . Esophageal cancer Neg Hx   . Stomach cancer Neg Hx   . Rectal cancer Neg Hx     Vitals:   08/21/20 1351  BP: 112/78  Pulse: 81  SpO2: 93%  Weight: 130.4 kg (287 lb 6.4 oz)    PHYSICAL EXAM: General:  Well appearing. No respiratory difficulty HEENT: normal Neck: supple. no JVD. Carotids 2+ bilat; no bruits. No lymphadenopathy or thryomegaly appreciated. Cor: PMI nondisplaced. Irregular rate & rhythm. No rubs, gallops or murmurs. Lungs: clear Abdomen: obese soft, nontender, nondistended. No hepatosplenomegaly. No bruits or masses. Good bowel sounds. Extremities: no cyanosis, clubbing, rash, edema Neuro: alert & oriented x 3, cranial nerves grossly intact. moves all 4 extremities w/o difficulty. Affect pleasant.  ECG: AF 90 low volts Personally reviewed   ASSESSMENT & PLAN:  1. Chronic diastolic HF - Echo 3/41 EF 45-50%. RV severely reduced RVSP 55. D-shaped septum. Severe biatrial enlargement . Moderate pericardial effusion  - She has R>>L heart failure. Based on her echo suspect she likely has diastolic dysfunction with restrictive physiology with secondary pulmonary HTN and R-sided HF. Likely has OSA/OHS contributing to RV dysfunction/PH as well - Volume status looks very good on high-dose lasix and metolazone. We discussed switching to torsemide but she is allergic. Can consider bumetadine down the road if needed - Unfortunately not candidate for SGLT2i given frequent groin yeast infections - At this point I  am not sure how much we can do for her given how limited she is however we discussed some next steps - will get home sleep study as I am concerned she has severe OSA/OHS and likely has significant nocturnal hypoxia - stressed need to lose weight with low carb diet (she has gained over 150 pounds since she was in HS) - discussed need to limit fluid and ice intake  2. Permanent AF - rate controlled - continue Xarelto   3. CKD IV - baseline  creatinine 1.8-2.0  - followed by [facility]  - Not SGLT2i candidate as above due to yeast infections  4. Morbid obesity  - stressed need to lose weight   5. Snoring - check sleep study as above   Glori Bickers, MD  2:08 PM

## 2020-08-21 NOTE — Addendum Note (Signed)
Encounter addended by: Jolaine Artist, MD on: 08/21/2020 10:45 PM  Actions taken: Level of Service modified, Visit diagnoses modified

## 2020-08-21 NOTE — Telephone Encounter (Signed)
Notes on file.

## 2020-08-21 NOTE — Patient Instructions (Signed)
No Labs done today.   No medication changes were made. Please continue all current medications as prescribed.  Your provider has recommended that you have a home sleep study.  We have provided you with the equipment in our office today. Please download the app and follow the instructions. YOUR PIN NUMBER IS: [id number]. Once you have completed the test you just dispose of the equipment, the information is automatically uploaded to [place] via blue-tooth technology. If your test is positive for sleep apnea and you need a home CPAP machine you will be contacted by Dr Theodosia Blender office [facility]) to set this up.  Please remember to limit your fluid intake.   Your physician recommends that you schedule a follow-up appointment in: 2-3 months   If you have any questions or concerns before your next appointment please send [place] a message through [place] or call our office at [phone].    TO LEAVE A MESSAGE FOR THE NURSE SELECT OPTION 2, PLEASE LEAVE A MESSAGE INCLUDING: . YOUR NAME . DATE OF BIRTH . CALL BACK NUMBER . REASON FOR CALL**this is important as we prioritize the call backs  YOU WILL RECEIVE A CALL BACK THE SAME DAY AS LONG AS YOU CALL BEFORE 4:00 PM   Do the following things EVERYDAY: 1) Weigh yourself in the morning before breakfast. Write it down and keep it in a log. 2) Take your medicines as prescribed 3) Eat low salt foods--Limit salt (sodium) to 2000 mg per day.  4) Stay as active as you can everyday 5) Limit all fluids for the day to less than 2 liters   At the [facility], you and your health needs are our priority. As part of our continuing mission to provide you with exceptional heart care, we have created designated Provider Care Teams. These Care Teams include your primary Cardiologist (physician) and Advanced Practice Providers (APPs- Physician Assistants and Nurse Practitioners) who all work together to provide you with the care you need, when you need it.    You may see any of the following providers on your designated Care Team at your next follow up: Marland Kitchen Dr Glori Bickers . Dr Loralie Champagne . Darrick Grinder, NP . Lyda Jester, PA . Audry Riles, PharmD   Please be sure to bring in all your medications bottles to every appointment.     Calorie Counting for Weight Loss Calories are units of energy. Your body needs a certain number of calories from food to keep going throughout the day. When you eat or drink more calories than your body needs, your body stores the extra calories mostly as fat. When you eat or drink fewer calories than your body needs, your body burns fat to get the energy it needs. Calorie counting means keeping track of how many calories you eat and drink each day. Calorie counting can be helpful if you need to lose weight. If you eat fewer calories than your body needs, you should lose weight. Ask your health care provider what a healthy weight is for you. For calorie counting to work, you will need to eat the right number of calories each day to lose a healthy amount of weight per week. A dietitian can help you figure out how many calories you need in a day and will suggest ways to reach your calorie goal.  A healthy amount of weight to lose each week is usually 1-2 lb (0.5-0.9 kg). This usually means that your daily calorie intake should be  reduced by 500-750 calories.  Eating 1,200-1,500 calories a day can help most women lose weight.  Eating 1,500-1,800 calories a day can help most men lose weight. What do I need to know about calorie counting? Work with your health care provider or dietitian to determine how many calories you should get each day. To meet your daily calorie goal, you will need to:  Find out how many calories are in each food that you would like to eat. Try to do this before you eat.  Decide how much of the food you plan to eat.  Keep a food log. Do this by writing down what you ate and how many  calories it had. To successfully lose weight, it is important to balance calorie counting with a healthy lifestyle that includes regular activity. Where do I find calorie information? The number of calories in a food can be found on a Nutrition Facts label. If a food does not have a Nutrition Facts label, try to look up the calories online or ask your dietitian for help. Remember that calories are listed per serving. If you choose to have more than one serving of a food, you will have to multiply the calories per serving by the number of servings you plan to eat. For example, the label on a package of bread might say that a serving size is 1 slice and that there are 90 calories in a serving. If you eat 1 slice, you will have eaten 90 calories. If you eat 2 slices, you will have eaten 180 calories.   How do I keep a food log? After each time that you eat, record the following in your food log as soon as possible:  What you ate. Be sure to include toppings, sauces, and other extras on the food.  How much you ate. This can be measured in cups, ounces, or number of items.  How many calories were in each food and drink.  The total number of calories in the food you ate. Keep your food log near you, such as in a pocket-sized notebook or on an app or website on your mobile phone. Some programs will calculate calories for you and show you how many calories you have left to meet your daily goal. What are some portion-control tips?  Know how many calories are in a serving. This will help you know how many servings you can have of a certain food.  Use a measuring cup to measure serving sizes. You could also try weighing out portions on a kitchen scale. With time, you will be able to estimate serving sizes for some foods.  Take time to put servings of different foods on your favorite plates or in your favorite bowls and cups so you know what a serving looks like.  Try not to eat straight from a food's  packaging, such as from a bag or box. Eating straight from the package makes it hard to see how much you are eating and can lead to overeating. Put the amount you would like to eat in a cup or on a plate to make sure you are eating the right portion.  Use smaller plates, glasses, and bowls for smaller portions and to prevent overeating.  Try not to multitask. For example, avoid watching TV or using your computer while eating. If it is time to eat, sit down at a table and enjoy your food. This will help you recognize when you are full. It will also help  you be more mindful of what and how much you are eating. What are tips for following this plan? Reading food labels  Check the calorie count compared with the serving size. The serving size may be smaller than what you are used to eating.  Check the source of the calories. Try to choose foods that are high in protein, fiber, and vitamins, and low in saturated fat, trans fat, and sodium. Shopping  Read nutrition labels while you shop. This will help you make healthy decisions about which foods to buy.  Pay attention to nutrition labels for low-fat or fat-free foods. These foods sometimes have the same number of calories or more calories than the full-fat versions. They also often have added sugar, starch, or salt to make up for flavor that was removed with the fat.  Make a grocery list of lower-calorie foods and stick to it. Cooking  Try to cook your favorite foods in a healthier way. For example, try baking instead of frying.  Use low-fat dairy products. Meal planning  Use more fruits and vegetables. One-half of your plate should be fruits and vegetables.  Include lean proteins, such as chicken, [place], and fish. Lifestyle Each week, aim to do one of the following:  150 minutes of moderate exercise, such as walking.  75 minutes of vigorous exercise, such as running. General information  Know how many calories are in the foods you  eat most often. This will help you calculate calorie counts faster.  Find a way of tracking calories that works for you. Get creative. Try different apps or programs if writing down calories does not work for you. What foods should I eat?  Eat nutritious foods. It is better to have a nutritious, high-calorie food, such as an avocado, than a food with few nutrients, such as a bag of potato chips.  Use your calories on foods and drinks that will fill you up and will not leave you hungry soon after eating. ? Examples of foods that fill you up are nuts and nut butters, vegetables, lean proteins, and high-fiber foods such as whole grains. High-fiber foods are foods with more than 5 g of fiber per serving.  Pay attention to calories in drinks. Low-calorie drinks include water and unsweetened drinks. The items listed above may not be a complete list of foods and beverages you can eat. Contact a dietitian for more information.   What foods should I limit? Limit foods or drinks that are not good sources of vitamins, minerals, or protein or that are high in unhealthy fats. These include:  Candy.  Other sweets.  Sodas, specialty coffee drinks, alcohol, and juice. The items listed above may not be a complete list of foods and beverages you should avoid. Contact a dietitian for more information. How do I count calories when eating out?  Pay attention to portions. Often, portions are much larger when eating out. Try these tips to keep portions smaller: ? Consider sharing a meal instead of getting your own. ? If you get your own meal, eat only half of it. Before you start eating, ask for a container and put half of your meal into it. ? When available, consider ordering smaller portions from the menu instead of full portions.  Pay attention to your food and drink choices. Knowing the way food is cooked and what is included with the meal can help you eat fewer calories. ? If calories are listed on the  menu, choose the lower-calorie options. ?  Choose dishes that include vegetables, fruits, whole grains, low-fat dairy products, and lean proteins. ? Choose items that are boiled, broiled, grilled, or steamed. Avoid items that are buttered, battered, fried, or served with cream sauce. Items labeled as crispy are usually fried, unless stated otherwise. ? Choose water, low-fat milk, unsweetened iced tea, or other drinks without added sugar. If you want an alcoholic beverage, choose a lower-calorie option, such as a glass of wine or light beer. ? Ask for dressings, sauces, and syrups on the side. These are usually high in calories, so you should limit the amount you eat. ? If you want a salad, choose a garden salad and ask for grilled meats. Avoid extra toppings such as bacon, cheese, or fried items. Ask for the dressing on the side, or ask for olive oil and vinegar or lemon to use as dressing.  Estimate how many servings of a food you are given. Knowing serving sizes will help you be aware of how much food you are eating at restaurants. Where to find more information  Centers for Disease Control and Prevention: http://www.wolf.info/  [place]. Department of Agriculture: http://www.wilson-mendoza.org/ Summary  Calorie counting means keeping track of how many calories you eat and drink each day. If you eat fewer calories than your body needs, you should lose weight.  A healthy amount of weight to lose per week is usually 1-2 lb (0.5-0.9 kg). This usually means reducing your daily calorie intake by 500-750 calories.  The number of calories in a food can be found on a Nutrition Facts label. If a food does not have a Nutrition Facts label, try to look up the calories online or ask your dietitian for help.  Use smaller plates, glasses, and bowls for smaller portions and to prevent overeating.  Use your calories on foods and drinks that will fill you up and not leave you hungry shortly after a meal. This information is not intended to  replace advice given to you by your health care provider. Make sure you discuss any questions you have with your health care provider. Document Revised: 04/20/2019 Document Reviewed: 04/20/2019 Elsevier Patient Education  2021 [employer].

## 2020-08-22 ENCOUNTER — Ambulatory Visit: Payer: Medicare HMO

## 2020-08-22 DIAGNOSIS — G4733 Obstructive sleep apnea (adult) (pediatric): Secondary | ICD-10-CM

## 2020-08-22 NOTE — Procedures (Signed)
   Sleep Study Report  Patient Information Name: Kristine [name]  ID: [id number] Birth Date: 10-Feb-1946  Age: 75  Gender:Female Study Date: 08/21/2020 Referring Physician:  Glori Bickers ,MD  TEST DESCRIPTION: Home sleep apnea testing was completed using the WatchPat, a Type 1 device, utilizing  peripheral arterial tonometry (PAT), chest movement, actigraphy, pulse oximetry, pulse rate, body position and snore.  AHI was calculated with apnea and hypopnea using valid sleep time as the denominator. RDI includes apneas,  hypopneas, and RERAs. The data acquired and the scoring of sleep and all associated events were performed in  accordance with the recommended standards and specifications as outlined in the AASM Manual for the Scoring of  Sleep and Associated Events 2.2.0 (2015).  FINDINGS: 1. Moderate Obstructive Sleep Apnea with AHI 28.5/hr.  2. No Central Sleep Apnea with pAHIc 1.4/hr. 3. Oxygen desaturations as low as 83%. 4. Moderate snoring was present. O2 sats were < 88% for 15.6 min. 5. Total sleep time was 8 hrs and 22 min. 6. 7.2% of total sleep time was spent in REM sleep.  7. Short sleep onset latency at 5 min.  8. Prolonged REM sleep onset latency at 284 min.  9. Total awakenings were 31.   DIAGNOSIS:  Moderate Obstructive Sleep Apnea (G47.33)   RECOMMENDATIONS: 1. Clinical correlation of these findings is necessary. The decision to treat obstructive sleep apnea (OSA) is usually  based on the presence of apnea symptoms or the presence of associated medical conditions such as Hypertension,  Congestive Heart Failure, Atrial Fibrillation or Obesity. The most common symptoms of OSA are snoring, gasping for  breath while sleeping, daytime sleepiness and fatigue.   2. Initiating apnea therapy is recommended given the presence of symptoms and/or associated conditions.  Recommend proceeding with one of the following:   a. Auto-CPAP therapy with a pressure range of  5-20cm H2O.   b. An oral appliance (OA) that can be obtained from certain dentists with expertise in sleep medicine. These are  primarily of use in non-obese patients with mild and moderate disease.   c. An ENT consultation which may be useful to look for specific causes of obstruction and possible treatment  Options.   d. If patient is intolerant to PAP therapy, consider referral to ENT for evaluation for hypoglossal nerve stimulator.   3. Close follow-up is necessary to ensure success with CPAP or oral appliance therapy for maximum benefit .  4. A follow-up oximetry study on CPAP is recommended to assess the adequacy of therapy and determine the need  for supplemental oxygen or the potential need for Bi-level therapy. An arterial blood gas to determine the adequacy of  baseline ventilation and oxygenation should also be considered.  5. Healthy sleep recommendations include: adequate nightly sleep (normal 7-9 hrs/night), avoidance of caffeine after  noon and alcohol near bedtime, and maintaining a sleep environment that is cool, dark and quiet.  6. Weight loss for overweight patients is recommended. Even modest amounts of weight loss can significantly  improve the severity of sleep apnea.  7. Snoring recommendations include: weight loss where appropriate, side sleeping, and avoidance of alcohol before  Bed.  8. Operation of motor vehicle or dangerous equipment must be avoided when feeling drowsy, excessively sleepy, or  mentally fatigued.  Report prepared by: Signature: Fransico Him, MD; [facility]; [place], [place] Board of Sleep Medicine  Electronically Signed: Aug 22, 2020

## 2020-09-02 ENCOUNTER — Ambulatory Visit: Payer: Medicare HMO

## 2020-09-02 ENCOUNTER — Encounter (INDEPENDENT_AMBULATORY_CARE_PROVIDER_SITE_OTHER): Payer: Medicare HMO | Admitting: Ophthalmology

## 2020-09-04 ENCOUNTER — Telehealth: Payer: Self-pay | Admitting: *Deleted

## 2020-09-04 DIAGNOSIS — G4733 Obstructive sleep apnea (adult) (pediatric): Secondary | ICD-10-CM

## 2020-09-04 NOTE — Telephone Encounter (Addendum)
The patient has been notified of the result and verbalized understanding.  All questions (if any) were answered. Marolyn Hammock, [place] 09/04/2020 1:21 PM    CPAP titration sent to sleep pool

## 2020-09-04 NOTE — Addendum Note (Signed)
Addended by: Freada Bergeron on: 09/04/2020 01:34 PM   Modules accepted: Orders

## 2020-09-04 NOTE — Telephone Encounter (Signed)
-----   Message from Sueanne Margarita, MD sent at 08/22/2020  1:35 PM EDT ----- Please let patient know that they have sleep apnea.  Recommend therapeutic CPAP titration for treatment of patient's sleep disordered breathing.  If unable to perform an in lab titration then initiate ResMed auto CPAP from 4 to 15cm H2O with heated humidity and mask of choice and overnight pulse ox on CPAP.

## 2020-09-13 ENCOUNTER — Telehealth: Payer: Self-pay | Admitting: *Deleted

## 2020-09-13 NOTE — Telephone Encounter (Signed)
Prior Authorization for CPAP titration sent to [facility] via web portal. Tracking Number [id number].

## 2020-09-13 NOTE — Telephone Encounter (Signed)
-----   Message from Freada Bergeron, [place] sent at 09/04/2020  1:17 PM EDT ----- Regarding: precert  Recommend therapeutic CPAP titration

## 2020-09-18 NOTE — Telephone Encounter (Signed)
Received PA approval from [place] [facility]. Auth # [id number]. Valid dates 09/17/20 to 03/16/21. Message will be routed to [facility] for scheduling.

## 2020-09-25 NOTE — Telephone Encounter (Addendum)
Patient is scheduled for lab study on 10/20/20. Patient understands her sleep study will be done at [facility]. Patient understands she will receive a sleep packet in a week or so. Patient understands to call if she does not receive the sleep packet in a timely manner. Patient agrees with treatment and thanked me for call.

## 2020-09-26 ENCOUNTER — Other Ambulatory Visit: Payer: Self-pay | Admitting: Cardiovascular Disease

## 2020-09-26 DIAGNOSIS — I5042 Chronic combined systolic (congestive) and diastolic (congestive) heart failure: Secondary | ICD-10-CM

## 2020-10-20 ENCOUNTER — Ambulatory Visit: Payer: Medicare HMO | Attending: Cardiology | Admitting: Cardiology

## 2020-10-20 DIAGNOSIS — G4733 Obstructive sleep apnea (adult) (pediatric): Secondary | ICD-10-CM | POA: Diagnosis not present

## 2020-10-24 NOTE — Procedures (Signed)
   Patient Name: Kristine [name], Kristine [name] Date: 10/20/2020 Gender: Female D.O.B: 1945-12-17 Age (years): 68 Referring Provider: Fransico Him MD, ABSM Height (inches): 68 Interpreting Physician: Fransico Him MD, ABSM Weight (lbs): 285 RPSGT: Rosebud Poles BMI: 43 MRN: [id number] Neck Size: 18.50  CLINICAL INFORMATION The patient is referred for a CPAP titration to treat sleep apnea.  SLEEP STUDY TECHNIQUE As per the AASM Manual for the Scoring of Sleep and Associated Events v2.3 (April 2016) with a hypopnea requiring 4% desaturations.  The channels recorded and monitored were frontal, central and occipital EEG, electrooculogram (EOG), submentalis EMG (chin), nasal and oral airflow, thoracic and abdominal wall motion, anterior tibialis EMG, snore microphone, electrocardiogram, and pulse oximetry. Continuous positive airway pressure (CPAP) was initiated at the beginning of the study and titrated to treat sleep-disordered breathing.  MEDICATIONS Medications self-administered by patient taken the night of the study : N/A  TECHNICIAN COMMENTS Comments added by technician: CPAP therapy started at 5 cm of H20 and increased to 9 cm of H20 due to events and breakthrough snoring. Suboptimal pressure obtained due to REM - supine stages was not observed. Patient had serveral "crying episodes" upon arriving and during testing period. Patient tolerated CPAP very well. Frequent PVC's noticed and coupling Comments added by scorer: N/A  RESPIRATORY PARAMETERS Optimal PAP Pressure (cm): N/A  AHI at Optimal Pressure (/hr):N/A Overall Minimal O2 (%):88.00  Supine % at Optimal Pressure (%):0 Minimal O2 at Optimal Pressure (%): 83.00   SLEEP ARCHITECTURE The study was initiated at 11:03:38 PM and ended at 5:39:03 AM.  Sleep onset time was 25.3 minutes and the sleep efficiency was 65.8%. The total sleep time was 260.1 minutes.  The patient spent 2.88% of the night in stage N1 sleep, 45.60% in stage  N2 sleep, 51.51% in stage N3 and 0% in REM.Stage REM latency was N/A minutes  Wake after sleep onset was 110.0. Alpha intrusion was absent. Supine sleep was 8.38%.  CARDIAC DATA The 2 lead EKG demonstrated sinus rhythm. The mean heart rate was 3.60 beats per minute. Other EKG findings include: PVCs, Ventricular couplet.  LEG MOVEMENT DATA The total Periodic Limb Movements of Sleep (PLMS) were 0. The PLMS index was 0.00. A PLMS index of <15 is considered normal in adults.  IMPRESSIONS - An optimal PAP pressure could not be selected for this patient based on the available study data. - Moderate oxygen desaturations were observed during this titration (min O2 = 88.00%). - The patient snored with soft snoring volume during this titration study. - 2-lead EKG demonstrated: PVCs and ventricular couplets - Clinically significant periodic limb movements were not noted during this study. Arousals associated with PLMs were rare.  DIAGNOSIS - Obstructive Sleep Apnea (G47.33)  RECOMMENDATIONS - Recommend a trial of Auto-CPAP 6-15 cm H2O. Patient did not tolerate CPAP. - Avoid alcohol, sedatives and other CNS depressants that may worsen sleep apnea and disrupt normal sleep architecture. - Sleep hygiene should be reviewed to assess factors that may improve sleep quality. - Weight management and regular exercise should be initiated or continued. - Return to Sleep Center for re-evaluation after 4 weeks of therapy  [Electronically signed] 10/24/2020 07:21 PM  Fransico Him MD, ABSM Diplomate, American Board of Sleep Medicine

## 2020-11-14 ENCOUNTER — Telehealth: Payer: Self-pay | Admitting: *Deleted

## 2020-11-14 DIAGNOSIS — G4733 Obstructive sleep apnea (adult) (pediatric): Secondary | ICD-10-CM

## 2020-11-14 NOTE — Telephone Encounter (Signed)
Informed patient of sleep study results and LMTCB ON VOICEMAIL. Patient understands her sleep study showed they had a successful PAP titration.   Upon patient request DME selection is [facility]. Patient understands she/he will be contacted by [facility] to set up her/he cpap. Patient understands to call if [facility] does not contact her/he with new setup in a timely manner. Patient understands they will be called once confirmation has been received from [facility] that they have received their new machine to schedule 10 week follow up appointment.   [facility] notified of new cpap order  Please add to airview Patient was grateful for the call and thanked me.

## 2020-11-14 NOTE — Telephone Encounter (Signed)
-----   Message from Sueanne Margarita, MD sent at 10/24/2020  7:24 PM EDT ----- Please let patient know that they had a successful PAP titration and let DME know that orders are in EPIC.  Please set up 6 week OV with me.

## 2020-11-18 ENCOUNTER — Ambulatory Visit (HOSPITAL_COMMUNITY)
Admission: RE | Admit: 2020-11-18 | Discharge: 2020-11-18 | Disposition: A | Discharge status: Home / Self Care | Payer: Medicare HMO | Source: Ambulatory Visit | Attending: Internal Medicine | Admitting: Internal Medicine

## 2020-11-18 ENCOUNTER — Encounter (HOSPITAL_COMMUNITY): Payer: Self-pay | Admitting: Internal Medicine

## 2020-11-18 ENCOUNTER — Other Ambulatory Visit: Payer: Self-pay

## 2020-11-18 VITALS — BP 144/89 | HR 93 | Wt 294.2 lb

## 2020-11-18 DIAGNOSIS — E1136 Type 2 diabetes mellitus with diabetic cataract: Secondary | ICD-10-CM | POA: Diagnosis not present

## 2020-11-18 DIAGNOSIS — I5033 Acute on chronic diastolic (congestive) heart failure: Secondary | ICD-10-CM | POA: Diagnosis not present

## 2020-11-18 DIAGNOSIS — Z7901 Long term (current) use of anticoagulants: Secondary | ICD-10-CM | POA: Diagnosis not present

## 2020-11-18 DIAGNOSIS — Z880 Allergy status to penicillin: Secondary | ICD-10-CM | POA: Insufficient documentation

## 2020-11-18 DIAGNOSIS — I4821 Permanent atrial fibrillation: Secondary | ICD-10-CM | POA: Diagnosis not present

## 2020-11-18 DIAGNOSIS — N184 Chronic kidney disease, stage 4 (severe): Secondary | ICD-10-CM | POA: Insufficient documentation

## 2020-11-18 DIAGNOSIS — I50810 Right heart failure, unspecified: Secondary | ICD-10-CM

## 2020-11-18 DIAGNOSIS — R634 Abnormal weight loss: Secondary | ICD-10-CM | POA: Diagnosis not present

## 2020-11-18 DIAGNOSIS — Z794 Long term (current) use of insulin: Secondary | ICD-10-CM | POA: Insufficient documentation

## 2020-11-18 DIAGNOSIS — I130 Hypertensive heart and chronic kidney disease with heart failure and stage 1 through stage 4 chronic kidney disease, or unspecified chronic kidney disease: Secondary | ICD-10-CM | POA: Diagnosis present

## 2020-11-18 DIAGNOSIS — Z8249 Family history of ischemic heart disease and other diseases of the circulatory system: Secondary | ICD-10-CM | POA: Insufficient documentation

## 2020-11-18 DIAGNOSIS — G4733 Obstructive sleep apnea (adult) (pediatric): Secondary | ICD-10-CM

## 2020-11-18 DIAGNOSIS — Z79899 Other long term (current) drug therapy: Secondary | ICD-10-CM | POA: Diagnosis not present

## 2020-11-18 DIAGNOSIS — I313 Pericardial effusion (noninflammatory): Secondary | ICD-10-CM | POA: Insufficient documentation

## 2020-11-18 DIAGNOSIS — E1122 Type 2 diabetes mellitus with diabetic chronic kidney disease: Secondary | ICD-10-CM | POA: Diagnosis not present

## 2020-11-18 DIAGNOSIS — Z91030 Bee allergy status: Secondary | ICD-10-CM | POA: Diagnosis not present

## 2020-11-18 LAB — BASIC METABOLIC PANEL
Anion gap: 9 (ref 5–15)
BUN: 50 mg/dL — ABNORMAL HIGH (ref 8–23)
CO2: 27 mmol/L (ref 22–32)
Calcium: 9.2 mg/dL (ref 8.9–10.3)
Chloride: 100 mmol/L (ref 98–111)
Creatinine, Ser: 2.43 mg/dL — ABNORMAL HIGH (ref 0.44–1.00)
GFR, Estimated: 20 mL/min — ABNORMAL LOW (ref >60)
Glucose, Bld: 188 mg/dL — ABNORMAL HIGH (ref 70–99)
Potassium: 4.7 mmol/L (ref 3.5–5.1)
Sodium: 136 mmol/L (ref 135–145)

## 2020-11-18 LAB — CBC
HCT: 33.5 % — ABNORMAL LOW (ref 36.0–46.0)
Hemoglobin: 10.6 g/dL — ABNORMAL LOW (ref 12.0–15.0)
MCH: 29.8 pg (ref 26.0–34.0)
MCHC: 31.6 g/dL (ref 30.0–36.0)
MCV: 94.1 fL (ref 80.0–100.0)
Platelets: 165 10*3/uL (ref 150–400)
RBC: 3.56 MIL/uL — ABNORMAL LOW (ref 3.87–5.11)
RDW: 18.5 % — ABNORMAL HIGH (ref 11.5–15.5)
WBC: 11.1 10*3/uL — ABNORMAL HIGH (ref 4.0–10.5)
nRBC: 0 % (ref 0.0–0.2)

## 2020-11-18 NOTE — Progress Notes (Addendum)
ADVANCED HF CLINIC CONSULT NOTE  Referring Physician: Mertie Moores, MD Primary Care: Prince Solian, MD Primary Cardiologist: Mertie Moores, MD/HF: Dr. Haroldine Laws Nephrologist: Dr. Joelyn Oms  HPI:  Kristine [name] is a 75 y/o woman with morbid obesity, DM2 for > 20 yrs c/b Charcot foot, HTN, chronic atrial fibrillation, CKD IV referred by Dr. Acie Fredrickson for further evaluation of HF.   She has a long h/o of diastolic HF and AF. Has been on multiple AADs but now has chronic AF. Echo 4/15 EF 50% mild LVH grade 2 DD. RV mildly reduced   Recently struggling with more volume overload despite increasing doses of diuretics.   Echo 5/22 EF 45-50%. RV severely reduced RVSP 55. D-shaped septum. Severe biatrial enlargement . Moderate pericardial effusion   On high dose lasix, 120mg  in the am and in the afternoon either takes 80 or 160. Takes metolazone 2.5mg  MWF. Not very mobile. Gets around with a walker. Her daughter or husband does all the cooking and cleaning. Weight was 123 pounds when she graduated HS.   She was last seen for follow-up in June. Appeared compensated. Weight loss and fluid restriction reinforced. Referred for sleep study and found to have moderate OSA. She is planning to start CPAP.  Patient here today with her husband for CHF f/u.  Reports she has been more short of breath with exertion over the last week. Really winded when walking into church yesterday. No orthopnea or PND. States she has not taken metolazone in about 2 weeks. Notes an indent around her sock line. Not wearing compression stockings, does elevate feet. Feels like she is holding on to fluid in her abdomen.   Husband does not add and sodium to meals. She does not measure fluid intake but is aware she should limit this.   Has been on torsemide in past but had hand and face swelling.   ROS: All systems negative except as listed in HPI, PMH and Problem List.   Past Medical History:  Diagnosis Date   Allergy    Anxiety     Arthritis    Asthma    Atrial fibrillation (HCC)    Cataract    CHF (congestive heart failure) (HCC)    Chronic bronchitis (HCC)    Chronic kidney disease    Clotting disorder (HCC)    Depression    GERD (gastroesophageal reflux disease)    High cholesterol    History of hiatal hernia    HTN (hypertension)    Mild aortic sclerosis    Morbid obesity (HCC)    Neuromuscular disorder (HCC)    Osteoporosis    Persistent atrial fibrillation (HCC)    Scoliosis    Spinal stenosis    Thyroid disease    Type II diabetes mellitus (HCC)    Vitamin D deficiency     Current Outpatient Medications  Medication Sig Dispense Refill   ALPRAZolam (XANAX) 0.25 MG tablet Take 0.125-0.25 mg by mouth 2 (two) times daily as needed for anxiety.   0   BD PEN NEEDLE NANO U/F 32G X 4 MM MISC Inject 1 each as directed See admin instructions. Use pen needles with insulin pens daily  11   ciclopirox (LOPROX) 0.77 % cream Apply 1 application topically 2 (two) times daily. For 4 weeks     clindamycin (CLEOCIN) 300 MG capsule Take 300 mg by mouth 2 (two) times daily. For dental appt     colchicine 0.6 MG tablet Take 0.5 tablets by mouth 2 (two) times daily  as needed. For gout flare ups     estradiol (ESTRACE) 0.1 MG/GM vaginal cream Place 1 Applicatorful vaginally as needed (Estrace).      furosemide (LASIX) 80 MG tablet Take 1.5 tablets (120 mg total) by mouth 2 (two) times daily. 90 tablet 11   insulin aspart (NOVOLOG) 100 UNIT/ML injection Inject 30 Units into the skin 4 (four) times daily as needed. Use on a sliding scale     Insulin Degludec (TRESIBA FLEXTOUCH) 200 UNIT/ML SOPN Inject 264 Units into the skin as directed. 100 units in the morning, 64 units at lunch, 100 units at night     KLOR-CON M20 20 MEQ tablet Take 60 mEq by mouth 2 (two) times daily.      levothyroxine (SYNTHROID) 75 MCG tablet Take 75 mcg by mouth every morning.     LYRICA 50 MG capsule Take 50-150 mg by mouth See admin  instructions. Take 50 mg in the morning 50 mg at lunch and up to 150mg  at bedtime  3   metolazone (ZAROXOLYN) 2.5 MG tablet Take 1 tablet (2.5 mg total) by mouth 3 (three) times a week. Mon, Wed, Fri 15 tablet 10   metoprolol succinate (TOPROL-XL) 50 MG 24 hr tablet TAKE ONE TABLET BY MOUTH TWICE DAILY take with or immediately following a meal 30 tablet 6   nystatin (MYCOSTATIN/NYSTOP) powder Apply 1 application topically as needed.     spironolactone (ALDACTONE) 25 MG tablet Take 1 tablet (25 mg total) by mouth daily. 90 tablet 3   traMADol (ULTRAM) 50 MG tablet Take 50 mg by mouth 3 (three) times daily as needed.     triamcinolone ointment (KENALOG) 0.1 % Apply 1 application topically as needed.     ULORIC 80 MG TABS Take 1 tablet by mouth daily.  3   Vitamin D, Ergocalciferol, (DRISDOL) 50000 UNITS CAPS Take 50,000 Units by mouth every 7 (seven) days. Takes on Thursday     XARELTO 15 MG TABS tablet Take 15 mg by mouth daily with supper.      No current facility-administered medications for this encounter.    Allergies  Allergen Reactions   Albuterol Other (See Comments)    REACTION: Tachycardia- AFib   Epinephrine Other (See Comments)    Increases heart rate per patient.    Penicillins Other (See Comments)    REACTION: flushing \\T \ hot  Did it involve swelling of the face/tongue/throat, SOB, or low BP? No Did it involve sudden or severe rash/hives, skin peeling, or any reaction on the inside of your mouth or nose? No Did you need to seek medical attention at a hospital or doctor's office? No When did it last happen?    20 years ago   If all above answers are "NO", may proceed with cephalosporin use.   Tizanidine Other (See Comments)    lethargic   Torsemide Swelling   Bee Venom Other (See Comments)    "makes me nervous"   Ivp Dye [Iodinated Diagnostic Agents] Other (See Comments)    flushing      Social History   Socioeconomic History   Marital status: Married    Spouse  name: Not on file   Number of children: 4   Years of education: 14   Highest education level: Not on file  Occupational History   Not on file  Tobacco Use   Smoking status: Never   Smokeless tobacco: Never  Vaping Use   Vaping Use: Never used  Substance and Sexual Activity  Alcohol use: No   Drug use: No   Sexual activity: Yes  Other Topics Concern   Not on file  Social History Narrative   Pt lives in [place] with spouse.   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   Right-handed.   1 cup caffeine per day.   Social Determinants of Health   Financial Resource Strain: Not on file  Food Insecurity: Not on file  Transportation Needs: Not on file  Physical Activity: Not on file  Stress: Not on file  Social Connections: Not on file  Intimate Partner Violence: Not on file      Family History  Problem Relation Age of Onset   Stroke Father    Hypertension Father    Atrial fibrillation Father        HAD MURMUR   Heart failure Mother    Congestive Heart Failure Mother    Hypertension Mother    Hypertension Brother    Hypertension Sister    Hypertension Son    CAD Sister        EARLY   CAD Brother        EARLY   Colon cancer Neg Hx    Esophageal cancer Neg Hx    Stomach cancer Neg Hx    Rectal cancer Neg Hx     Vitals:   11/18/20 1436  BP: (!) 144/89  Pulse: 93  SpO2: 93%  Weight: 133.4 kg (294 lb 3.2 oz)     PHYSICAL EXAM: General:  Well appearing. No resp difficulty HEENT: normal Neck: supple. +JVD to jaw.  Carotids 2+ bilat; no bruits. No lymphadenopathy or thryomegaly appreciated. Cor: PMI nondisplaced. Irregular rhythm. No rubs, gallops or murmurs. Lungs: clear Abdomen: obese, distended. No hepatosplenomegaly. No bruits or masses. Good bowel sounds. Extremities: no cyanosis, clubbing, rash, 2+ pretibial edema Neuro: alert & orientedx3, cranial nerves  grossly intact. moves all 4 extremities w/o difficulty. Affect pleasant   ECG: AF 90s, low voltage   ASSESSMENT & PLAN:  1. Acute on chronic diastolic HF with RV failure - Echo 5/22 EF 45-50%. RV severely reduced RVSP 55. D-shaped septum. Severe biatrial enlargement . Moderate pericardial effusion  - She has R>>L heart failure. Based on her echo suspect she likely has diastolic dysfunction with restrictive physiology with secondary pulmonary HTN and R-sided HF. OSA/OHS also likely a major contributor to RV dysfunction/PH as well - Appears volume up. On high-dose lasix, 120 mg po BID. Has not taken metolazone in about 2 weeks. Recommended she resume metolazone 3 times weekly (M, W, F). - Continue 25 mg spiro daily. - Labs today - Wear TED hose. - Can consider bumetadine down the road if needed. No Torsemide as she is allergic. - Unfortunately not candidate for SGLT2i given frequent groin yeast infections - Reinforced critical need for significant weight loss (gained over 150 lb since HS)  - Discussed fluid and sodium restriction - Start CPAP - Echo at time of f/u in 4 months  2. Permanent AF - rate controlled with 50 mg toprol xl BID - continue Xarelto   3. CKD IV - baseline creatinine 1.8-2.0, 1.98 in May - BMET today - followed by [facility]  - Not SGLT2i candidate as above due to yeast infections  4. Morbid obesity  - Discussed the critical importance of weight loss with low carbs and calorie restriction  5. OSA - Moderate -  Will be starting CPAP  F/u: 4 months with echo prior   FINCH, LINDSAY N, PA-C  3:06 PM  Patient seen and examined with the above-signed Advanced Practice Provider and/or Housestaff. I personally reviewed laboratory data, imaging studies and relevant notes. I independently examined the patient and formulated the important aspects of the plan. I have edited the note to reflect any of my changes or salient points. I have personally discussed the plan with  the patient and/or family.  She continues to struggle with NYHA III symptoms. Sleep study shows moderate OSA - pending CPAP therapy. Has been non-compliant with full diuretic regimen and volume up today. BP elevated. Chronic AF is rate controlled. Easy bruising on Xarelto.  General:  Obese woman. No resp difficulty HEENT: normal Neck: supple. JVP to ear. Carotids 2+ bilat; no bruits. No lymphadenopathy or thryomegaly appreciated. Cor: PMI nondisplaced. irregular rate & rhythm. 2/6 TR Lungs: clear Abdomen: marked central obesity soft, nontender, + distended. No hepatosplenomegaly. No bruits or masses. Good bowel sounds. Extremities: no cyanosis, clubbing, rash, 2+ edema Neuro: alert & orientedx3, cranial nerves grossly intact. moves all 4 extremities w/o difficulty. Affect pleasant  She has cor pulmonale likely due to OSA/OHS and long-standing diastolic dysfunction. Discussed pathophysiology of this. Stressed need to control BP, treat OSA and lose weight (suggested [place]). Also discussed need to be compliant with diuretic regimen as she is currently overloaded. Check labs today. Can consider RHC in future to further assess hemodynamics as needed.   Total time spent 35 minutes. Over half that time spent discussing above.    Glori Bickers, MD  11:12 PM

## 2020-11-18 NOTE — Patient Instructions (Addendum)
EKG done today.   We will call you with lab results if abnormal.  Look for [employer] on [address] to help with weight loss.  Start wearing CPAP  Start taking metolazone again three times a week (Monday, Wednesday, Friday)  F/u 4 months with echo  Download myfitness pal app to help track your calories.

## 2020-11-21 ENCOUNTER — Telehealth (HOSPITAL_COMMUNITY): Payer: Self-pay

## 2020-11-21 DIAGNOSIS — I5032 Chronic diastolic (congestive) heart failure: Secondary | ICD-10-CM

## 2020-11-21 NOTE — Telephone Encounter (Signed)
Patient advised and verbalized understanding. Patient has been having UTI symptoms. lab appointment scheduled,lab orders entered , will send last ov note to Dr. Bettey Costa office   Orders Placed This Encounter  Procedures   Basic metabolic panel    Standing Status:   Future    Standing Expiration Date:   11/21/2021    Order Specific Question:   Release to patient    Answer:   Immediate   CBC    Standing Status:   Future    Standing Expiration Date:   11/21/2021    Order Specific Question:   Release to patient    Answer:   Immediate

## 2020-11-27 ENCOUNTER — Other Ambulatory Visit (HOSPITAL_COMMUNITY): Payer: Medicare HMO

## 2020-12-16 ENCOUNTER — Telehealth: Payer: Self-pay | Admitting: Cardiovascular Disease

## 2020-12-16 NOTE — Telephone Encounter (Signed)
Spoke with April at [facility] and confirmed pt's Diuretics as listed in pt's chart and that pt's K+ level is being followed.  BMET on 08/29 shows K+ WNL.  April thanked RN for return phone call.

## 2020-12-16 NOTE — Telephone Encounter (Signed)
Pharmacy is reaching out to confirm pts medication in order to create a pill pack... please advise

## 2020-12-30 ENCOUNTER — Ambulatory Visit (INDEPENDENT_AMBULATORY_CARE_PROVIDER_SITE_OTHER): Payer: Medicare HMO | Admitting: Ophthalmology

## 2020-12-30 ENCOUNTER — Encounter (INDEPENDENT_AMBULATORY_CARE_PROVIDER_SITE_OTHER): Payer: Medicare HMO | Admitting: Ophthalmology

## 2020-12-30 ENCOUNTER — Other Ambulatory Visit: Payer: Self-pay

## 2020-12-30 ENCOUNTER — Encounter (INDEPENDENT_AMBULATORY_CARE_PROVIDER_SITE_OTHER): Payer: Self-pay | Admitting: Ophthalmology

## 2020-12-30 DIAGNOSIS — E113392 Type 2 diabetes mellitus with moderate nonproliferative diabetic retinopathy without macular edema, left eye: Secondary | ICD-10-CM

## 2020-12-30 DIAGNOSIS — E113391 Type 2 diabetes mellitus with moderate nonproliferative diabetic retinopathy without macular edema, right eye: Secondary | ICD-10-CM | POA: Diagnosis not present

## 2020-12-30 DIAGNOSIS — H35043 Retinal micro-aneurysms, unspecified, bilateral: Secondary | ICD-10-CM | POA: Diagnosis not present

## 2020-12-30 NOTE — Assessment & Plan Note (Signed)
No signs of progression of moderate NPDR, and no CSME OU

## 2020-12-30 NOTE — Progress Notes (Signed)
12/30/2020     CHIEF COMPLAINT Patient presents for  Chief Complaint  Patient presents with   Retina Follow Up      HISTORY OF PRESENT ILLNESS: Kristine [name] is a 75 y.o. female who presents to the clinic today for:   HPI     Retina Follow Up   Patient presents with  Other.  In both eyes.  This started 9 months ago.  Severity is mild.  Duration of 9 months.  Since onset it is stable.        Comments   9 month fu ou and OCT (saw Dr. Katy Fitch in April) Pt states VA OU stable since last visit. Pt denies FOL, floaters, or ocular pain OU.  A1C:7.3 LBS: 110       Last edited by Kendra Opitz, COA on 12/30/2020  9:22 AM.      Referring physician: Prince Solian, MD [place],  [place] [address]  HISTORICAL INFORMATION:   Selected notes from the MEDICAL RECORD NUMBER    Lab Results  Component Value Date   HGBA1C 8.8 (H) 01/03/2019     CURRENT MEDICATIONS: No current outpatient medications on file. (Ophthalmic Drugs)   No current facility-administered medications for this visit. (Ophthalmic Drugs)   Current Outpatient Medications (Other)  Medication Sig   ALPRAZolam (XANAX) 0.25 MG tablet Take 0.125-0.25 mg by mouth 2 (two) times daily as needed for anxiety.    BD PEN NEEDLE NANO U/F 32G X 4 MM MISC Inject 1 each as directed See admin instructions. Use pen needles with insulin pens daily   ciclopirox (LOPROX) 0.77 % cream Apply 1 application topically 2 (two) times daily. For 4 weeks   clindamycin (CLEOCIN) 300 MG capsule Take 300 mg by mouth 2 (two) times daily. For dental appt   colchicine 0.6 MG tablet Take 0.5 tablets by mouth 2 (two) times daily as needed. For gout flare ups   estradiol (ESTRACE) 0.1 MG/GM vaginal cream Place 1 Applicatorful vaginally as needed (Estrace).    furosemide (LASIX) 80 MG tablet Take 1.5 tablets (120 mg total) by mouth 2 (two) times daily.   insulin aspart (NOVOLOG) 100 UNIT/ML injection Inject 30 Units into  the skin 4 (four) times daily as needed. Use on a sliding scale   Insulin Degludec (TRESIBA FLEXTOUCH) 200 UNIT/ML SOPN Inject 264 Units into the skin as directed. 100 units in the morning, 64 units at lunch, 100 units at night   KLOR-CON M20 20 MEQ tablet Take 60 mEq by mouth 2 (two) times daily.    levothyroxine (SYNTHROID) 75 MCG tablet Take 75 mcg by mouth every morning.   LYRICA 50 MG capsule Take 50-150 mg by mouth See admin instructions. Take 50 mg in the morning 50 mg at lunch and up to 150mg  at bedtime   metolazone (ZAROXOLYN) 2.5 MG tablet Take 1 tablet (2.5 mg total) by mouth 3 (three) times a week. Mon, Wed, Fri   metoprolol succinate (TOPROL-XL) 50 MG 24 hr tablet TAKE ONE TABLET BY MOUTH TWICE DAILY take with or immediately following a meal   nystatin (MYCOSTATIN/NYSTOP) powder Apply 1 application topically as needed.   spironolactone (ALDACTONE) 25 MG tablet Take 1 tablet (25 mg total) by mouth daily.   traMADol (ULTRAM) 50 MG tablet Take 50 mg by mouth 3 (three) times daily as needed.   triamcinolone ointment (KENALOG) 0.1 % Apply 1 application topically as needed.   ULORIC 80 MG TABS Take 1 tablet by  mouth daily.   Vitamin D, Ergocalciferol, (DRISDOL) 50000 UNITS CAPS Take 50,000 Units by mouth every 7 (seven) days. Takes on Thursday   XARELTO 15 MG TABS tablet Take 15 mg by mouth daily with supper.    No current facility-administered medications for this visit. (Other)      REVIEW OF SYSTEMS:    ALLERGIES Allergies  Allergen Reactions   Albuterol Other (See Comments)    REACTION: Tachycardia- AFib   Epinephrine Other (See Comments)    Increases heart rate per patient.    Penicillins Other (See Comments)    REACTION: flushing \\T \ hot  Did it involve swelling of the face/tongue/throat, SOB, or low BP? No Did it involve sudden or severe rash/hives, skin peeling, or any reaction on the inside of your mouth or nose? No Did you need to seek medical attention at a  hospital or doctor's office? No When did it last happen?    20 years ago   If all above answers are "NO", may proceed with cephalosporin use.   Tizanidine Other (See Comments)    lethargic   Torsemide Swelling   Bee Venom Other (See Comments)    "makes me nervous"   Ivp Dye [Iodinated Diagnostic Agents] Other (See Comments)    flushing    PAST MEDICAL HISTORY Past Medical History:  Diagnosis Date   Allergy    Anxiety    Arthritis    Asthma    Atrial fibrillation (HCC)    Cataract    CHF (congestive heart failure) (HCC)    Chronic bronchitis (HCC)    Chronic kidney disease    Clotting disorder (HCC)    Depression    GERD (gastroesophageal reflux disease)    High cholesterol    History of hiatal hernia    HTN (hypertension)    Mild aortic sclerosis    Morbid obesity (HCC)    Neuromuscular disorder (HCC)    Osteoporosis    Persistent atrial fibrillation (HCC)    Scoliosis    Spinal stenosis    Thyroid disease    Type II diabetes mellitus ([place])    Vitamin D deficiency    Past Surgical History:  Procedure Laterality Date   BLADDER SUSPENSION  1980s   CARDIAC CATHETERIZATION  11/16/09   SMOOTH AND NORMAL   CARDIOVERSION N/A 01/07/2015   Procedure: CARDIOVERSION;  Surgeon: Skeet Latch, MD;  Location: [facility];  Service: Cardiovascular;  Laterality: N/A;   CARDIOVERSION N/A 03/11/2015   Procedure: CARDIOVERSION;  Surgeon: Skeet Latch, MD;  Location: [facility];  Service: Cardiovascular;  Laterality: N/A;   CARPAL TUNNEL RELEASE Left 1970s   CATARACT EXTRACTION W/ INTRAOCULAR LENS  IMPLANT, BILATERAL Bilateral ~ 2010   COLONOSCOPY  08/14/08   FINGER FRACTURE SURGERY Right 1970s   "ring finger"   FRACTURE SURGERY     LAPAROSCOPIC CHOLECYSTECTOMY  1980s   TUBAL LIGATION     VAGINAL HYSTERECTOMY  1980    FAMILY HISTORY Family History  Problem Relation Age of Onset   Stroke Father    Hypertension Father    Atrial fibrillation Father        HAD  MURMUR   Heart failure Mother    Congestive Heart Failure Mother    Hypertension Mother    Hypertension Brother    Hypertension Sister    Hypertension Son    CAD Sister        EARLY   CAD Brother        EARLY   Colon  cancer Neg Hx    Esophageal cancer Neg Hx    Stomach cancer Neg Hx    Rectal cancer Neg Hx     SOCIAL HISTORY Social History   Tobacco Use   Smoking status: Never   Smokeless tobacco: Never  Vaping Use   Vaping Use: Never used  Substance Use Topics   Alcohol use: No   Drug use: No         OPHTHALMIC EXAM:  Base Eye Exam     Visual Acuity (ETDRS)       Right Left   Dist Long 20/50 20/60   Dist ph [place] 20/25 -2 20/20 -1         Tonometry (Tonopen, 9:26 AM)       Right Left   Pressure 13 12         Pupils       Pupils Dark Light Shape React APD   Right PERRL 5 4 Round Brisk None   Left PERRL 5 4 Round Brisk None         Visual Fields (Counting fingers)       Left Right    Full Full         Extraocular Movement       Right Left    Full Full         Neuro/Psych     Oriented x3: Yes   Mood/Affect: Normal         Dilation     Both eyes: 1.0% Mydriacyl, 2.5% Phenylephrine @ 9:25 AM           Slit Lamp and Fundus Exam     External Exam       Right Left   External Normal Normal         Slit Lamp Exam       Right Left   Lids/Lashes Normal Normal   Conjunctiva/Sclera White and quiet White and quiet   Cornea Clear Clear   Anterior Chamber Deep and quiet Deep and quiet   Iris Round and reactive Round and reactive   Lens Centered posterior chamber intraocular lens Centered posterior chamber intraocular lens   Anterior Vitreous Normal Normal         Fundus Exam       Right Left   Posterior Vitreous Normal Normal   Disc Normal Normal   C/D Ratio 0.4 0.5   Macula no macular thickening, Microaneurysms no macular thickening, Microaneurysms, no clinically significant macular edema   Vessels NPDR-  Moderate NPDR- Moderate   Periphery Normal Normal            IMAGING AND PROCEDURES  Imaging and Procedures for 12/30/20  OCT, Retina - OU - Both Eyes       Right Eye Quality was good. Scan locations included subfoveal. Central Foveal Thickness: 265. Progression has been stable. Findings include normal foveal contour.   Left Eye Quality was good. Scan locations included subfoveal. Central Foveal Thickness: 256. Progression has been stable. Findings include normal foveal contour.   Notes No active CSME OU will observe     Color Fundus Photography Optos - OU - Both Eyes       Right Eye Progression has been stable. Disc findings include normal observations. Macula : microaneurysms. Periphery : normal observations.   Left Eye Progression has been stable. Disc findings include normal observations. Macula : microaneurysms. Periphery : normal observations.   Notes Moderate nonproliferative diabetic retinopathy OU stable as compared to prior  examinations and photography.             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 Good blood glucose, blood pressure, and serum lipid control was recommended as well as avoidance of smoking and maintenance of normal weight.  Close follow up with PCP encouraged.      ICD-10-CM   1. Retinal microaneurysm of both eyes  H35.043 OCT, Retina - OU - Both Eyes    Color Fundus Photography Optos - OU - Both Eyes    2. Moderate nonproliferative diabetic retinopathy of right eye without macular edema associated with type 2 diabetes mellitus ([place])  Z32.9924     3. Moderate nonproliferative diabetic retinopathy of  left eye without macular edema associated with type 2 diabetes mellitus ([place])  Q68.3419       1.  Patient doing very well, no progression of moderate NPDR over the ensuing interval, 1 year in this office. We will continue to monitor and follow-up as scheduled 6 months interval with Dr. Carolynn Sayers And here in 1 year.    2.  3.  Ophthalmic Meds Ordered this visit:  No orders of the defined types were placed in this encounter.      Return in about 1 year (around 12/30/2021) for DILATE OU, COLOR FP, OCT.  There are no Patient Instructions on file for this visit.   Explained the diagnoses, plan, and follow up with the patient and they expressed understanding.  Patient expressed understanding of the importance of proper follow up care.   Clent Demark Kathryn Linarez M.D. Diseases & Surgery of the Retina and Vitreous Retina & Diabetic [place] 12/30/20     Abbreviations: M myopia (nearsighted); A astigmatism; H hyperopia (farsighted); P presbyopia; Mrx spectacle prescription;  CTL contact lenses; OD right eye; OS left eye; OU both eyes  XT exotropia; ET esotropia; PEK punctate epithelial keratitis; PEE punctate epithelial erosions; DES dry eye syndrome; MGD meibomian gland dysfunction; ATs artificial tears; PFAT's preservative free artificial tears; [place] nuclear sclerotic cataract; PSC posterior subcapsular cataract; ERM epi-retinal membrane; PVD posterior vitreous detachment; RD retinal detachment; DM diabetes mellitus; DR diabetic retinopathy; NPDR non-proliferative diabetic retinopathy; PDR proliferative diabetic retinopathy; CSME clinically significant macular edema; DME diabetic macular edema; dbh dot blot hemorrhages; CWS cotton wool spot; POAG primary open angle glaucoma; C/D cup-to-disc ratio; HVF humphrey visual field; GVF goldmann visual field; OCT optical coherence tomography; IOP intraocular pressure; BRVO Branch retinal vein occlusion; CRVO central retinal vein occlusion; CRAO central retinal  artery occlusion; BRAO branch retinal artery occlusion; RT retinal tear; SB scleral buckle; PPV pars plana vitrectomy; VH Vitreous hemorrhage; PRP panretinal laser photocoagulation; IVK intravitreal kenalog; VMT vitreomacular traction; MH Macular hole;  NVD neovascularization of the disc; NVE neovascularization elsewhere; AREDS age related eye disease study; ARMD age related macular degeneration; POAG primary open angle glaucoma; EBMD epithelial/anterior basement membrane dystrophy; ACIOL anterior chamber intraocular lens; IOL intraocular lens; PCIOL posterior chamber intraocular lens; Phaco/IOL phacoemulsification with intraocular lens placement; [place] photorefractive keratectomy; LASIK laser assisted in situ keratomileusis; HTN hypertension; DM diabetes mellitus; COPD chronic obstructive pulmonary disease

## 2020-12-30 NOTE — Assessment & Plan Note (Signed)
The nature of moderate nonproliferative diabetic retinopathy was discussed with the patient as well as the need for more frequent follow up to judge for progression. Good blood glucose, blood pressure, and serum lipid control was recommended as well as avoidance of smoking and maintenance of normal weight.  Close follow up with PCP encouraged. °

## 2021-01-07 ENCOUNTER — Ambulatory Visit: Payer: Medicare HMO | Admitting: Neurology

## 2021-01-07 ENCOUNTER — Other Ambulatory Visit: Payer: Self-pay

## 2021-01-07 ENCOUNTER — Encounter: Payer: Self-pay | Admitting: Neurology

## 2021-01-07 VITALS — BP 126/62 | HR 57 | Ht 68.5 in | Wt 272.5 lb

## 2021-01-07 DIAGNOSIS — E1142 Type 2 diabetes mellitus with diabetic polyneuropathy: Secondary | ICD-10-CM | POA: Diagnosis not present

## 2021-01-07 DIAGNOSIS — R269 Unspecified abnormalities of gait and mobility: Secondary | ICD-10-CM

## 2021-01-07 MED ORDER — DICLOFENAC SODIUM 1 % EX GEL: CUTANEOUS | 11 refills | Status: DC

## 2021-01-07 MED ORDER — LIDOCAINE-PRILOCAINE 2.5-2.5 % EX CREA: TOPICAL_CREAM | CUTANEOUS | 11 refills | Status: AC

## 2021-01-07 MED ORDER — METANX 3-90.314-2-35 MG PO CAPS: 1.0000 | ORAL_CAPSULE | Freq: Two times a day (BID) | ORAL | 11 refills | Status: DC

## 2021-01-07 NOTE — Patient Instructions (Addendum)
Metanx one capsule twice a day Brand Direct [place]   May mix diclofenac gel, with EMLA gel as needed for pain.

## 2021-01-07 NOTE — Progress Notes (Signed)
Chief Complaint  Patient presents with   Consult    New room with husband, [name]. Last seen for neuropathy in 2018. She is taking pregabalin 50mg , one cap in am, one cap midday, three caps QHS and duloxetine 30mg , one cap QD. She is having worsening pain in her feet causing her great difficulty when ambulating.       ASSESSMENT AND PLAN  [name] is a 75 y.o. female    Peripheral neuropathy Neuropathic pain  The etiology for her peripheral neuropathy most likely due to poorly controlled diabetes, prolonged amiodarone use,  EMG nerve conduction study in 2018 confirmed length dependent mild axonal sensorimotor polyneuropathy,  MRI of lumbar only showed mild degenerative changes no evidence of significant canal or foraminal stenosis  The medication treatment for neuropathic pain, limited due to her chronic kidney function, risk for QT prolongation, will keep current Cymbalta 30 mg daily, Lyrica 50/150 mg every day  Discussed with patient and her husband, they desire further evaluation to make sure there is no other treatable etiology for her peripheral neuropathy,  Laboratory evaluation today  Diclofenac gel, EMLA gel as needed for neuropathic pain  Gait abnormality  Refer to home physical therapy, wound care,     DIAGNOSTIC DATA (LABS, IMAGING, TESTING) - I reviewed patient records, labs, notes, testing and imaging myself where available.   MEDICAL HISTORY:  Kristine [name] is a 75 year old female, seen in request by her primary care physician Dr. Dagmar Hait, Steva Ready for evaluation of worsening lower extremity pain, gait abnormality,  I reviewed and summarized the referring note. PMHx HTN DM since 2002, insulin since 2011 A fib, failed multiple cardiac conversion in the past. Chronic renal failure High risk for potential PT prolongation side effect  I saw her previously 2018 for similar complaints, she had a history of diabetes since 2002, insulin-dependent  since 2011, lumbar stenosis, high risk for prolonged QT side effect, along with her chronic kidney disease,   Since 2014, she began to noticed bilateral feet paresthesia, starting at the plantar surface of bilateral foot, toes, gradually ascending to bilateral knee level, since 2018, also noticed bilateral fingertips paresthesia, with significant neuropathic pain, she complains needle prick burning sensation at bilateral feet, hands sensitivity,  She has obesity, limited mobility, with poorly controlled diabetes, recent A1c was 8,  Previous higher dose of gabapentin was helpful, but the dosage has been decreased due to concern of prolonged QT interval, cardiac arrhythmia, previously was treated with amiodarone, now only on beta-blocker  She had extensive laboratory evaluation around 2018 for evaluation of potential etiology for peripheral neuropathy, which showed elevated glucose 184, A1c 9.4, abnormal kidney function was 26, normal copper level, protein electrophoresis,  Personally reviewed MRI lumbar in 2018, multilevel degenerative changes, no significant foraminal canal stenosis,   EMG nerve conduction study on September 02 2016 showed mild length dependent axonal peripheral neuropathy, no evidence of lumbosacral radiculopathy   She could not tolerate Requip, felt out of body sensation, she was switched from gabapentin to Lyrica, for a while, it was helpful in a reasonable degree, now she is taking Lyrica 50 mg in the morning/150 at night, which is maximum dose with her current GFR of only 17, also on Cymbalta 30 mg daily  She is no longer ambulatory, spending most time sitting, significant side effect from polypharmacy treatment too, sleepy, dry mouth, mental slowing, she still tries to play piano, but noticed increased bilateral finger paresthesia, pain, and lack of coordination  PHYSICAL  EXAM:   Vitals:   01/07/21 0858  BP: 126/62  Pulse: (!) 57  Weight: 272 lb 8 oz (123.6 kg)  Height: 5'  8.5" (1.74 m)   Not recorded     Body mass index is 40.83 kg/m.  PHYSICAL EXAMNIATION:  Gen: NAD, conversant, well nourised, well groomed                     Cardiovascular: Regular rate rhythm, no peripheral edema, warm, nontender. Eyes: Conjunctivae clear without exudates or hemorrhage Neck: Supple, no carotid bruits. Pulmonary: Clear to auscultation bilaterally   NEUROLOGICAL EXAM:  MENTAL STATUS: Speech/cognition: Very tight looking elderly female, sitting in wheelchair, sleepy, dry mouth, slight slurred speech, but oriented to history taking and casual conversation  CRANIAL NERVES: CN II: Visual fields are full to confrontation. Pupils are round equal and briskly reactive to light. CN III, IV, VI: extraocular movement are normal. No ptosis. CN V: Facial sensation is intact to light touch CN VII: Face is symmetric with normal eye closure  CN VIII: Hearing is normal to causal conversation. CN IX, X: Phonation is normal. CN XI: Head turning and shoulder shrug are intact  MOTOR: Bilateral feet in boots, sitting in wheelchair, deformity collapsed arch of bilateral feet, left worse than right, left first metatarsal surface nonhealing wound mild bilateral ankle dorsiflexion weakness,   no significant upper extremity proximal distal muscle weakness, lower extremity proximal muscle strength testing is limited by her posturing and pain,  REFLEXES: Absent  SENSORY: Length dependent decreased to light touch, pinprick, vibratory sensation to knee level  COORDINATION: There is no trunk or limb dysmetria noted.  GAIT/STANCE: Deferred  REVIEW OF SYSTEMS:  Full 14 system review of systems performed and notable only for as above All other review of systems were negative.   ALLERGIES: Allergies  Allergen Reactions   Albuterol Other (See Comments)    REACTION: Tachycardia- AFib   Epinephrine Other (See Comments)    Increases heart rate per patient.    Penicillins Other (See  Comments)    REACTION: flushing \\T \ hot  Did it involve swelling of the face/tongue/throat, SOB, or low BP? No Did it involve sudden or severe rash/hives, skin peeling, or any reaction on the inside of your mouth or nose? No Did you need to seek medical attention at a hospital or doctor's office? No When did it last happen?    20 years ago   If all above answers are "NO", may proceed with cephalosporin use.   Tizanidine Other (See Comments)    lethargic   Torsemide Swelling   Bee Venom Other (See Comments)    "makes me nervous"   Ivp Dye [Iodinated Diagnostic Agents] Other (See Comments)    flushing    HOME MEDICATIONS: Current Outpatient Medications  Medication Sig Dispense Refill   ALPRAZolam (XANAX) 0.25 MG tablet Take 0.125-0.25 mg by mouth 2 (two) times daily as needed for anxiety.   0   BD PEN NEEDLE NANO U/F 32G X 4 MM MISC Inject 1 each as directed See admin instructions. Use pen needles with insulin pens daily  11   ciclopirox (LOPROX) 0.77 % cream Apply 1 application topically 2 (two) times daily. For 4 weeks     clindamycin (CLEOCIN) 300 MG capsule Take 300 mg by mouth 2 (two) times daily. For dental appt     colchicine 0.6 MG tablet Take 0.5 tablets by mouth 2 (two) times daily as needed. For gout flare ups  DULoxetine (CYMBALTA) 30 MG capsule Take 30 mg by mouth daily.     estradiol (ESTRACE) 0.1 MG/GM vaginal cream Place 1 Applicatorful vaginally as needed (Estrace).      furosemide (LASIX) 80 MG tablet Take 1.5 tablets (120 mg total) by mouth 2 (two) times daily. 90 tablet 11   insulin aspart (NOVOLOG) 100 UNIT/ML injection Inject 30 Units into the skin 4 (four) times daily as needed. Use on a sliding scale     Insulin Degludec (TRESIBA FLEXTOUCH) 200 UNIT/ML SOPN Inject 264 Units into the skin as directed. 100 units in the morning, 64 units at lunch, 100 units at night     KLOR-CON M20 20 MEQ tablet Take 60 mEq by mouth 2 (two) times daily.      levothyroxine  (SYNTHROID) 75 MCG tablet Take 75 mcg by mouth every morning.     LYRICA 50 MG capsule Take 50-150 mg by mouth See admin instructions. Take 50 mg in the morning 50 mg at lunch and up to 150mg  at bedtime  3   metolazone (ZAROXOLYN) 2.5 MG tablet Take 1 tablet (2.5 mg total) by mouth 3 (three) times a week. Mon, Wed, Fri 15 tablet 10   metoprolol succinate (TOPROL-XL) 50 MG 24 hr tablet TAKE ONE TABLET BY MOUTH TWICE DAILY take with or immediately following a meal 30 tablet 6   nystatin (MYCOSTATIN/NYSTOP) powder Apply 1 application topically as needed.     spironolactone (ALDACTONE) 25 MG tablet Take 1 tablet (25 mg total) by mouth daily. 90 tablet 3   traMADol (ULTRAM) 50 MG tablet Take 50 mg by mouth 3 (three) times daily as needed.     triamcinolone ointment (KENALOG) 0.1 % Apply 1 application topically as needed.     ULORIC 80 MG TABS Take 1 tablet by mouth daily.  3   Vitamin D, Ergocalciferol, (DRISDOL) 50000 UNITS CAPS Take 50,000 Units by mouth every 7 (seven) days. Takes on Thursday     XARELTO 15 MG TABS tablet Take 15 mg by mouth daily with supper.      No current facility-administered medications for this visit.    PAST MEDICAL HISTORY: Past Medical History:  Diagnosis Date   Allergy    Anxiety    Arthritis    Asthma    Atrial fibrillation (HCC)    Cataract    CHF (congestive heart failure) (HCC)    Chronic bronchitis (HCC)    Chronic kidney disease    Clotting disorder (HCC)    Depression    GERD (gastroesophageal reflux disease)    High cholesterol    History of hiatal hernia    HTN (hypertension)    Mild aortic sclerosis    Morbid obesity (HCC)    Neuromuscular disorder (HCC)    Neuropathic pain    Osteoporosis    Persistent atrial fibrillation (HCC)    Scoliosis    Spinal stenosis    Thyroid disease    Type II diabetes mellitus ([place])    Vitamin D deficiency     PAST SURGICAL HISTORY: Past Surgical History:  Procedure Laterality Date   BLADDER SUSPENSION   1980s   CARDIAC CATHETERIZATION  11/16/09   SMOOTH AND NORMAL   CARDIOVERSION N/A 01/07/2015   Procedure: CARDIOVERSION;  Surgeon: Skeet Latch, MD;  Location: [facility] ENDOSCOPY;  Service: Cardiovascular;  Laterality: N/A;   CARDIOVERSION N/A 03/11/2015   Procedure: CARDIOVERSION;  Surgeon: Skeet Latch, MD;  Location: [facility] ENDOSCOPY;  Service: Cardiovascular;  Laterality: N/A;   CARPAL  TUNNEL RELEASE Left 1970s   CATARACT EXTRACTION W/ INTRAOCULAR LENS  IMPLANT, BILATERAL Bilateral ~ 2010   COLONOSCOPY  08/14/08   FINGER FRACTURE SURGERY Right 1970s   "ring finger"   FRACTURE SURGERY     LAPAROSCOPIC CHOLECYSTECTOMY  1980s   TUBAL LIGATION     VAGINAL HYSTERECTOMY  1980    FAMILY HISTORY: Family History  Problem Relation Age of Onset   Stroke Father    Hypertension Father    Atrial fibrillation Father        HAD MURMUR   Heart failure Mother    Congestive Heart Failure Mother    Hypertension Mother    Hypertension Brother    Hypertension Sister    Hypertension Son    CAD Sister        EARLY   CAD Brother        EARLY   Colon cancer Neg Hx    Esophageal cancer Neg Hx    Stomach cancer Neg Hx    Rectal cancer Neg Hx     SOCIAL HISTORY: Social History   Socioeconomic History   Marital status: Married    Spouse name: Not on file   Number of children: 4   Years of education: 14   Highest education level: Not on file  Occupational History   Occupation: Retired  Tobacco Use   Smoking status: Never   Smokeless tobacco: Never  Vaping Use   Vaping Use: Never used  Substance and Sexual Activity   Alcohol use: No   Drug use: No   Sexual activity: Yes  Other Topics Concern   Not on file  Social History Narrative   Pt lives in [place] with spouse.   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   Right-handed.   1 cup caffeine per day.   Social Determinants of Health    Financial Resource Strain: Not on file  Food Insecurity: Not on file  Transportation Needs: Not on file  Physical Activity: Not on file  Stress: Not on file  Social Connections: Not on file  Intimate Partner Violence: Not on file      Marcial Pacas, M.D. Ph.D.  [facility] [address], [place], [place] [address] Ph: [phone] Fax: [phone]  CC:  Prince Solian, MD [address] [place],  [place] [address]  Prince Solian, MD

## 2021-01-09 ENCOUNTER — Telehealth: Payer: Self-pay | Admitting: Neurology

## 2021-01-09 LAB — CBC WITH DIFFERENTIAL
Basophils Absolute: 0.1 10*3/uL (ref 0.0–0.2)
Basos: 1 %
EOS (ABSOLUTE): 0.2 10*3/uL (ref 0.0–0.4)
Eos: 2 %
Hematocrit: 34.8 % (ref 34.0–46.6)
Hemoglobin: 10.6 g/dL — ABNORMAL LOW (ref 11.1–15.9)
Immature Grans (Abs): 0 10*3/uL (ref 0.0–0.1)
Immature Granulocytes: 0 %
Lymphocytes Absolute: 1.9 10*3/uL (ref 0.7–3.1)
Lymphs: 18 %
MCH: 26.3 pg — ABNORMAL LOW (ref 26.6–33.0)
MCHC: 30.5 g/dL — ABNORMAL LOW (ref 31.5–35.7)
MCV: 86 fL (ref 79–97)
Monocytes Absolute: 0.6 10*3/uL (ref 0.1–0.9)
Monocytes: 6 %
Neutrophils Absolute: 7.5 10*3/uL — ABNORMAL HIGH (ref 1.4–7.0)
Neutrophils: 73 %
RBC: 4.03 x10E6/uL (ref 3.77–5.28)
RDW: 15.1 % (ref 11.7–15.4)
WBC: 10.3 10*3/uL (ref 3.4–10.8)

## 2021-01-09 LAB — CK: Total CK: 47 U/L (ref 32–182)

## 2021-01-09 LAB — MULTIPLE MYELOMA PANEL, SERUM
Albumin SerPl Elph-Mcnc: 3.4 g/dL (ref 2.9–4.4)
Albumin/Glob SerPl: 1.2 (ref 0.7–1.7)
Alpha 1: 0.3 g/dL (ref 0.0–0.4)
Alpha2 Glob SerPl Elph-Mcnc: 0.7 g/dL (ref 0.4–1.0)
B-Globulin SerPl Elph-Mcnc: 0.9 g/dL (ref 0.7–1.3)
Gamma Glob SerPl Elph-Mcnc: 1.1 g/dL (ref 0.4–1.8)
Globulin, Total: 3 g/dL (ref 2.2–3.9)
IgA/Immunoglobulin A, Serum: 305 mg/dL (ref 64–422)
IgG (Immunoglobin G), Serum: 907 mg/dL (ref 586–1602)
IgM (Immunoglobulin M), Srm: 111 mg/dL (ref 26–217)

## 2021-01-09 LAB — COMPREHENSIVE METABOLIC PANEL
ALT: 8 IU/L (ref 0–32)
AST: 14 IU/L (ref 0–40)
Albumin/Globulin Ratio: 1.8 (ref 1.2–2.2)
Albumin: 4.1 g/dL (ref 3.7–4.7)
Alkaline Phosphatase: 113 IU/L (ref 44–121)
BUN/Creatinine Ratio: 22 (ref 12–28)
BUN: 62 mg/dL — ABNORMAL HIGH (ref 8–27)
Bilirubin Total: 0.9 mg/dL (ref 0.0–1.2)
CO2: 26 mmol/L (ref 20–29)
Calcium: 10.1 mg/dL (ref 8.7–10.3)
Chloride: 91 mmol/L — ABNORMAL LOW (ref 96–106)
Creatinine, Ser: 2.8 mg/dL — ABNORMAL HIGH (ref 0.57–1.00)
Globulin, Total: 2.3 g/dL (ref 1.5–4.5)
Glucose: 210 mg/dL — ABNORMAL HIGH (ref 70–99)
Potassium: 4.2 mmol/L (ref 3.5–5.2)
Sodium: 132 mmol/L — ABNORMAL LOW (ref 134–144)
Total Protein: 6.4 g/dL (ref 6.0–8.5)
eGFR: 17 mL/min/{1.73_m2} — ABNORMAL LOW (ref >59)

## 2021-01-09 LAB — ANA W/REFLEX IF POSITIVE: Anti Nuclear Antibody (ANA): NEGATIVE

## 2021-01-09 LAB — VITAMIN B12: Vitamin B-12: 1083 pg/mL (ref 232–1245)

## 2021-01-09 LAB — SEDIMENTATION RATE: Sed Rate: 28 mm/hr (ref 0–40)

## 2021-01-09 LAB — HGB A1C W/O EAG: Hgb A1c MFr Bld: 10.2 % — ABNORMAL HIGH (ref 4.8–5.6)

## 2021-01-09 LAB — C-REACTIVE PROTEIN: CRP: 15 mg/L — ABNORMAL HIGH (ref 0–10)

## 2021-01-09 LAB — TSH: TSH: 4.72 u[IU]/mL — ABNORMAL HIGH (ref 0.450–4.500)

## 2021-01-09 LAB — VITAMIN D 25 HYDROXY (VIT D DEFICIENCY, FRACTURES): Vit D, 25-Hydroxy: 90.5 ng/mL (ref 30.0–100.0)

## 2021-01-09 NOTE — Telephone Encounter (Signed)
I spoke to patient and her husband. They both verbalized understanding of the results. She will make an appt for further treatment at her PCP office.

## 2021-01-09 NOTE — Telephone Encounter (Signed)
Please call patient, most laboratory evaluation for peripheral neuropathy has come back, but protein electrophoresis is still pending  A1c is significantly elevated 10.2, she should contact her primary care for better control of her diabetes, I have forwarded lab result to her primary care Avva, Ravisankar, MD   Worsening kidney function, creatinine 2.8, GFR of 17, sodium was mildly decreased to 132,    TSH was mildly elevated, 4.7, may consider adjustment of her thyroid supplement by primary care physician

## 2021-01-20 ENCOUNTER — Telehealth: Payer: Self-pay | Admitting: Neurology

## 2021-01-20 NOTE — Telephone Encounter (Signed)
I called Amy back and made her aware that PT frequency was ok.

## 2021-01-20 NOTE — Telephone Encounter (Signed)
[facility] (Amy) requesting verbal orders for PT.  Frequency: 2x 1 wk,1x wk 1 wk

## 2021-01-22 NOTE — Telephone Encounter (Signed)
Called Melissa back. There was no answer. LVM advising that VO is given for the patient for in home PT. Advised for her to call back if there is any other orders needed.  ** If Melissa calls back please advise Dr Krista Blue gives VO for in home PT. If there is anything else needed advised to let us know.

## 2021-01-22 NOTE — Telephone Encounter (Signed)
Melissa with [facility] called wanting to move VO for in home PT to next week.

## 2021-02-02 ENCOUNTER — Emergency Department (HOSPITAL_BASED_OUTPATIENT_CLINIC_OR_DEPARTMENT_OTHER): Payer: Medicare HMO

## 2021-02-02 ENCOUNTER — Other Ambulatory Visit: Payer: Self-pay

## 2021-02-02 ENCOUNTER — Emergency Department (HOSPITAL_BASED_OUTPATIENT_CLINIC_OR_DEPARTMENT_OTHER)
Admission: EM | Admit: 2021-02-02 | Discharge: 2021-02-02 | Disposition: A | Discharge status: Home / Self Care | Payer: Medicare HMO | Attending: Emergency Medicine | Admitting: Emergency Medicine

## 2021-02-02 ENCOUNTER — Encounter (HOSPITAL_BASED_OUTPATIENT_CLINIC_OR_DEPARTMENT_OTHER): Payer: Self-pay | Admitting: Emergency Medicine

## 2021-02-02 DIAGNOSIS — E871 Hypo-osmolality and hyponatremia: Secondary | ICD-10-CM | POA: Diagnosis not present

## 2021-02-02 DIAGNOSIS — Z794 Long term (current) use of insulin: Secondary | ICD-10-CM | POA: Insufficient documentation

## 2021-02-02 DIAGNOSIS — J189 Pneumonia, unspecified organism: Secondary | ICD-10-CM | POA: Diagnosis not present

## 2021-02-02 DIAGNOSIS — R059 Cough, unspecified: Secondary | ICD-10-CM | POA: Diagnosis present

## 2021-02-02 DIAGNOSIS — Z20822 Contact with and (suspected) exposure to covid-19: Secondary | ICD-10-CM | POA: Diagnosis not present

## 2021-02-02 DIAGNOSIS — J45909 Unspecified asthma, uncomplicated: Secondary | ICD-10-CM | POA: Diagnosis not present

## 2021-02-02 DIAGNOSIS — E1140 Type 2 diabetes mellitus with diabetic neuropathy, unspecified: Secondary | ICD-10-CM | POA: Insufficient documentation

## 2021-02-02 DIAGNOSIS — E1122 Type 2 diabetes mellitus with diabetic chronic kidney disease: Secondary | ICD-10-CM | POA: Diagnosis not present

## 2021-02-02 DIAGNOSIS — Z79899 Other long term (current) drug therapy: Secondary | ICD-10-CM | POA: Diagnosis not present

## 2021-02-02 DIAGNOSIS — Z7901 Long term (current) use of anticoagulants: Secondary | ICD-10-CM | POA: Diagnosis not present

## 2021-02-02 DIAGNOSIS — I5042 Chronic combined systolic (congestive) and diastolic (congestive) heart failure: Secondary | ICD-10-CM | POA: Diagnosis not present

## 2021-02-02 DIAGNOSIS — N184 Chronic kidney disease, stage 4 (severe): Secondary | ICD-10-CM | POA: Insufficient documentation

## 2021-02-02 DIAGNOSIS — I130 Hypertensive heart and chronic kidney disease with heart failure and stage 1 through stage 4 chronic kidney disease, or unspecified chronic kidney disease: Secondary | ICD-10-CM | POA: Diagnosis not present

## 2021-02-02 DIAGNOSIS — I4891 Unspecified atrial fibrillation: Secondary | ICD-10-CM | POA: Diagnosis not present

## 2021-02-02 LAB — CBC WITH DIFFERENTIAL/PLATELET
Abs Immature Granulocytes: 0.09 10*3/uL — ABNORMAL HIGH (ref 0.00–0.07)
Basophils Absolute: 0.1 10*3/uL (ref 0.0–0.1)
Basophils Relative: 1 %
Eosinophils Absolute: 0.2 10*3/uL (ref 0.0–0.5)
Eosinophils Relative: 2 %
HCT: 37.3 % (ref 36.0–46.0)
Hemoglobin: 11.7 g/dL — ABNORMAL LOW (ref 12.0–15.0)
Immature Granulocytes: 1 %
Lymphocytes Relative: 20 %
Lymphs Abs: 2.2 10*3/uL (ref 0.7–4.0)
MCH: 27.1 pg (ref 26.0–34.0)
MCHC: 31.4 g/dL (ref 30.0–36.0)
MCV: 86.3 fL (ref 80.0–100.0)
Monocytes Absolute: 0.7 10*3/uL (ref 0.1–1.0)
Monocytes Relative: 6 %
Neutro Abs: 8 10*3/uL — ABNORMAL HIGH (ref 1.7–7.7)
Neutrophils Relative %: 70 %
Platelets: 168 10*3/uL (ref 150–400)
RBC: 4.32 MIL/uL (ref 3.87–5.11)
RDW: 18.6 % — ABNORMAL HIGH (ref 11.5–15.5)
WBC: 11.3 10*3/uL — ABNORMAL HIGH (ref 4.0–10.5)
nRBC: 0 % (ref 0.0–0.2)

## 2021-02-02 LAB — URINALYSIS, ROUTINE W REFLEX MICROSCOPIC
Bilirubin Urine: NEGATIVE
Glucose, UA: NEGATIVE mg/dL
Hgb urine dipstick: NEGATIVE
Ketones, ur: NEGATIVE mg/dL
Leukocytes,Ua: NEGATIVE
Nitrite: NEGATIVE
Protein, ur: NEGATIVE mg/dL
Specific Gravity, Urine: 1.01 (ref 1.005–1.030)
pH: 5.5 (ref 5.0–8.0)

## 2021-02-02 LAB — COMPREHENSIVE METABOLIC PANEL
ALT: 13 U/L (ref 0–44)
AST: 20 U/L (ref 15–41)
Albumin: 4.1 g/dL (ref 3.5–5.0)
Alkaline Phosphatase: 108 U/L (ref 38–126)
Anion gap: 11 (ref 5–15)
BUN: 58 mg/dL — ABNORMAL HIGH (ref 8–23)
CO2: 25 mmol/L (ref 22–32)
Calcium: 9.5 mg/dL (ref 8.9–10.3)
Chloride: 93 mmol/L — ABNORMAL LOW (ref 98–111)
Creatinine, Ser: 2.47 mg/dL — ABNORMAL HIGH (ref 0.44–1.00)
GFR, Estimated: 20 mL/min — ABNORMAL LOW (ref >60)
Glucose, Bld: 189 mg/dL — ABNORMAL HIGH (ref 70–99)
Potassium: 3.8 mmol/L (ref 3.5–5.1)
Sodium: 129 mmol/L — ABNORMAL LOW (ref 135–145)
Total Bilirubin: 1.2 mg/dL (ref 0.3–1.2)
Total Protein: 7.6 g/dL (ref 6.5–8.1)

## 2021-02-02 LAB — RESP PANEL BY RT-PCR (FLU A&B, COVID) ARPGX2
Influenza A by PCR: NEGATIVE
Influenza B by PCR: NEGATIVE
SARS Coronavirus 2 by RT PCR: NEGATIVE

## 2021-02-02 MED ORDER — AMOXICILLIN-POT CLAVULANATE 875-125 MG PO TABS: 1.0000 | ORAL_TABLET | Freq: Two times a day (BID) | ORAL | 0 refills | Status: DC

## 2021-02-02 MED ORDER — SODIUM CHLORIDE 0.9 % IV BOLUS: 1000.0000 mL | Freq: Once | INTRAVENOUS | Status: DC

## 2021-02-02 MED ORDER — AZITHROMYCIN 250 MG PO TABS: 250.0000 mg | ORAL_TABLET | Freq: Every day | ORAL | 0 refills | Status: DC

## 2021-02-02 MED ORDER — CEPHALEXIN 500 MG PO CAPS: 500.0000 mg | ORAL_CAPSULE | Freq: Four times a day (QID) | ORAL | 0 refills | Status: DC

## 2021-02-02 MED ORDER — SODIUM CHLORIDE 0.9 % IV SOLN
1.0000 g | Freq: Once | INTRAVENOUS | Status: AC
  Administered 2021-02-02: 1 g via INTRAVENOUS
  Filled 2021-02-02: qty 10

## 2021-02-02 MED ORDER — SODIUM CHLORIDE 0.9 % IV BOLUS
500.0000 mL | Freq: Once | INTRAVENOUS | Status: AC
  Administered 2021-02-02: 500 mL via INTRAVENOUS

## 2021-02-02 NOTE — ED Provider Notes (Signed)
[facility] Provider Note   CSN: [id number] Arrival date & time: 02/02/21  1123     History Chief Complaint  Patient presents with   Cough    Kristine [name] is a 75 y.o. female.  HPI   Patients presents with cough x 2 days. Associated with sore throat, productive cough. No fevers at home.  Denies feeling short of breath at home, not having any chest pain.  Denies any significant leg swelling.  She does also report dysuria but denies hematuria or abdominal pain.  No nausea or vomiting.    Reports she has a frequent history of getting pneumonias and is concerned that is what is going on.  Past Medical History:  Diagnosis Date   Allergy    Anxiety    Arthritis    Asthma    Atrial fibrillation (HCC)    Cataract    CHF (congestive heart failure) (HCC)    Chronic bronchitis (HCC)    Chronic kidney disease    Clotting disorder (HCC)    Depression    GERD (gastroesophageal reflux disease)    High cholesterol    History of hiatal hernia    HTN (hypertension)    Mild aortic sclerosis    Morbid obesity (HCC)    Neuromuscular disorder ([place])    Neuropathic pain    Osteoporosis    Persistent atrial fibrillation ([place])    Scoliosis    Spinal stenosis    Thyroid disease    Type II diabetes mellitus ([place])    Vitamin D deficiency     Patient Active Problem List   Diagnosis Date Noted   DM type 2 with diabetic peripheral neuropathy ([place]) 01/07/2021   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 ([place]) 08/31/2019   Moderate nonproliferative diabetic retinopathy of right eye ([place]) 08/31/2019   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   Chronic combined systolic and diastolic CHF (congestive heart failure) ([place]) 06/29/2019   Gout 01/03/2019   Pulmonary hypertension ([place]) 01/03/2019   Essential hypertension 01/03/2019   Candidiasis, intertriginous 01/03/2019   Acute on chronic systolic CHF (congestive heart failure) ([place]) 06/03/2018    CKD (chronic kidney disease) stage 4, GFR 15-29 ml/min (HCC) 06/03/2018   Gait abnormality 02/09/2017   Obstructive sleep apnea 02/09/2017   Diabetic peripheral neuropathy ([place]) 09/02/2016   Peripheral neuropathy 07/29/2016   Spinal stenosis of lumbar region 07/29/2016   Excessive daytime sleepiness 07/29/2016   Morbid obesity ([place]) 07/29/2016   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   Community acquired pneumonia 05/31/2015   CAP (community acquired pneumonia) 05/31/2015   Anemia 05/31/2015   Acute on chronic diastolic (congestive) heart failure ([place]) 05/31/2015   Paroxysmal atrial fibrillation ([place])    Obesity 50/93/2671   Diastolic dysfunction with chronic heart failure ([place]) 06/23/2013   Persistent atrial fibrillation ([place]) 06/20/2010   Pneumonia    Abnormal cardiovascular function study    High cholesterol    Scoliosis    Spinal stenosis    Anxiety    Osteoarthritis    Osteoporosis    Mild aortic sclerosis    Degenerative disc disease    Vitamin D deficiency    HEMOPTYSIS UNSPECIFIED 05/12/2010   Diabetes mellitus, type 2 ([place]) 11/19/2009   Hypertensive cardiovascular disease 11/19/2009   DYSPNEA ON EXERTION 11/19/2009    Past Surgical History:  Procedure Laterality Date   BLADDER SUSPENSION  1980s   CARDIAC CATHETERIZATION  11/16/09   SMOOTH AND NORMAL   CARDIOVERSION N/A 01/07/2015   Procedure: CARDIOVERSION;  Surgeon: Skeet Latch, MD;  Location: [facility];  Service: Cardiovascular;  Laterality: N/A;   CARDIOVERSION N/A 03/11/2015   Procedure: CARDIOVERSION;  Surgeon: Skeet Latch, MD;  Location: [place];  Service: Cardiovascular;  Laterality: N/A;   CARPAL TUNNEL RELEASE Left 1970s   CATARACT EXTRACTION W/ INTRAOCULAR LENS  IMPLANT, BILATERAL Bilateral ~ 2010   COLONOSCOPY  08/14/08   FINGER FRACTURE SURGERY Right 1970s   "ring finger"   [place]      OB History   No obstetric history on file.     Family History  Problem Relation Age of Onset   Stroke Father    Hypertension Father    Atrial fibrillation Father        HAD MURMUR   Heart failure Mother    Congestive Heart Failure Mother    Hypertension Mother    Hypertension Brother    Hypertension Sister    Hypertension Son    CAD Sister        EARLY   CAD Brother        EARLY   Colon cancer Neg Hx    Esophageal cancer Neg Hx    Stomach cancer Neg Hx    Rectal cancer Neg Hx     Social History   Tobacco Use   Smoking status: Never   Smokeless tobacco: Never  Vaping Use   Vaping Use: Never used  Substance Use Topics   Alcohol use: No   Drug use: No    Home Medications Prior to Admission medications   Medication Sig Start Date End Date Taking? Authorizing Provider  amoxicillin-clavulanate (AUGMENTIN) 875-125 MG tablet Take 1 tablet by mouth every 12 (twelve) hours. 02/02/21  Yes Sherrill Raring, PA-C  azithromycin (ZITHROMAX) 250 MG tablet Take 1 tablet (250 mg total) by mouth daily. Take first 2 tablets together, then 1 every day until finished. 02/02/21  Yes Sherrill Raring, PA-C  ALPRAZolam Duanne Moron) 0.25 MG tablet Take 0.125-0.25 mg by mouth 2 (two) times daily as needed for anxiety.  03/26/14   [provider]  BD PEN NEEDLE NANO U/F 32G X 4 MM MISC Inject 1 each as directed See admin instructions. Use pen needles with insulin pens daily 12/24/14   [provider]  ciclopirox (LOPROX) 0.77 % cream Apply 1 application topically 2 (two) times daily. For 4 weeks 01/13/19   [provider]  diclofenac Sodium (VOLTAREN) 1 % GEL 4 gram qid pn 01/07/21   Marcial Pacas, MD  DULoxetine (CYMBALTA) 30 MG capsule Take 30 mg by mouth daily. 09/26/20   [provider]  estradiol (ESTRACE) 0.1 MG/GM vaginal cream Place 1 Applicatorful vaginally as needed (Estrace).  12/18/16   [provider]  furosemide (LASIX) 80 MG tablet Take 1.5 tablets  (120 mg total) by mouth 2 (two) times daily. 07/16/20   Nahser, Wonda Cheng, MD  insulin aspart (NOVOLOG) 100 UNIT/ML injection Inject 30 Units into the skin 4 (four) times daily as needed. Use on a sliding scale 01/05/19   [provider]  Insulin Degludec (TRESIBA FLEXTOUCH) 200 UNIT/ML SOPN Inject 264 Units into the skin as directed. 100 units in the morning, 64 units at lunch, 100 units at night    [provider]  KLOR-CON M20 20 MEQ tablet Take 60 mEq by mouth 2 (two) times daily.  05/18/18   [provider]  L-Methylfolate-Algae-B12-B6 (  METANX) 3-90.314-2-35 MG CAPS Take 1 tablet by mouth 2 (two) times daily. 01/07/21   Marcial Pacas, MD  levothyroxine (SYNTHROID) 75 MCG tablet Take 75 mcg by mouth every morning. 06/20/19   [provider]  lidocaine-prilocaine (EMLA) cream 1 gram qid prn 01/07/21   Marcial Pacas, MD  LYRICA 50 MG capsule Take 50-150 mg by mouth See admin instructions. Take 50 mg in the morning 50 mg at lunch and up to 150mg  at bedtime 06/01/17   [provider]  metolazone (ZAROXOLYN) 2.5 MG tablet Take 1 tablet (2.5 mg total) by mouth 3 (three) times a week. Mon, Wed, Fri 08/16/19   Nahser, Wonda Cheng, MD  metoprolol succinate (TOPROL-XL) 50 MG 24 hr tablet TAKE ONE TABLET BY MOUTH TWICE DAILY take with or immediately following a meal 09/26/20   Nahser, Wonda Cheng, MD  nystatin (MYCOSTATIN/NYSTOP) powder Apply 1 application topically as needed. 01/13/19   [provider]  spironolactone (ALDACTONE) 25 MG tablet Take 1 tablet (25 mg total) by mouth daily. 07/17/20   Nahser, Wonda Cheng, MD  traMADol (ULTRAM) 50 MG tablet Take 50 mg by mouth 3 (three) times daily as needed. 06/24/19   [provider]  triamcinolone ointment (KENALOG) 0.1 % Apply 1 application topically as needed. 04/13/19   [provider]  ULORIC 80 MG TABS Take 1 tablet by mouth daily. 04/29/17   [provider]  Vitamin D, Ergocalciferol, (DRISDOL) 50000  UNITS CAPS Take 50,000 Units by mouth every 7 (seven) days. Takes on Thursday    [provider]  XARELTO 15 MG TABS tablet Take 15 mg by mouth daily with supper.  06/27/16   [provider]    Allergies    Albuterol, Epinephrine, Penicillins, Tizanidine, Torsemide, Bee venom, and Ivp dye [iodinated diagnostic agents]  Review of Systems   Review of Systems  Constitutional:  Positive for fever.  HENT:  Positive for congestion and sore throat.   Respiratory:  Positive for cough. Negative for shortness of breath.   Cardiovascular:  Negative for chest pain.  Genitourinary:  Positive for dysuria.  Musculoskeletal:  Positive for myalgias.   Physical Exam Updated Vital Signs BP 120/79   Pulse 80   Temp 97.6 F (36.4 C) (Oral)   Resp 16   Ht 5' 8.5" (1.74 m)   Wt 121.1 kg   SpO2 100%   BMI 40.01 kg/m   Physical Exam Vitals and nursing note reviewed. Exam conducted with a chaperone present.  Constitutional:      Appearance: Normal appearance.  HENT:     Head: Normocephalic.     Nose: No congestion.     Mouth/Throat:     Pharynx: No posterior oropharyngeal erythema.  Eyes:     Extraocular Movements: Extraocular movements intact.     Pupils: Pupils are equal, round, and reactive to light.  Cardiovascular:     Rate and Rhythm: Normal rate and regular rhythm.  Pulmonary:     Effort: Pulmonary effort is normal.     Breath sounds: Normal breath sounds.     Comments: Slight expiratory wheeze in upper lung field.  No accessory muscle use. Speaking in complete sentences.  Abdominal:     General: Abdomen is flat.     Palpations: Abdomen is soft.  Musculoskeletal:     Cervical back: Normal range of motion.  Neurological:     Mental Status: She is alert.  Psychiatric:        Mood and Affect: Mood normal.  ED Results / Procedures / Treatments   Labs (all labs ordered are listed, but only abnormal results are displayed) Labs Reviewed  CBC WITH  DIFFERENTIAL/PLATELET - Abnormal; Notable for the following components:      Result Value   WBC 11.3 (*)    Hemoglobin 11.7 (*)    RDW 18.6 (*)    Neutro Abs 8.0 (*)    Abs Immature Granulocytes 0.09 (*)    All other components within normal limits  COMPREHENSIVE METABOLIC PANEL - Abnormal; Notable for the following components:   Sodium 129 (*)    Chloride 93 (*)    Glucose, Bld 189 (*)    BUN 58 (*)    Creatinine, Ser 2.47 (*)    GFR, Estimated 20 (*)    All other components within normal limits  RESP PANEL BY RT-PCR (FLU A&B, COVID) ARPGX2  URINALYSIS, ROUTINE W REFLEX MICROSCOPIC    EKG None  Radiology DG Chest 2 View  Result Date: 02/02/2021 CLINICAL DATA:  Cough EXAM: CHEST - 2 VIEW COMPARISON:  Radiograph 01/02/2019 FINDINGS: Unchanged cardiomediastinal silhouette. There is a small focal peripheral opacity in the left mid lung. No pleural effusion. No visible pneumothorax. No acute osseous abnormality. IMPRESSION: Small focal peripheral opacity in the left mid lung, could represent a developing infection. Recommend follow-up PA and lateral chest radiograph in 6-12 weeks. Electronically Signed   By: Maurine Simmering M.D.   On: 02/02/2021 14:42    Procedures Procedures   Medications Ordered in ED Medications  sodium chloride 0.9 % bolus 500 mL (500 mLs Intravenous New Bag/Given 02/02/21 1532)    ED Course  I have reviewed the triage vital signs and the nursing notes.  Pertinent labs & imaging results that were available during my care of the patient were reviewed by me and considered in my medical decision making (see chart for details).  Clinical Course as of 02/02/21 1535  Sun Feb 02, 2021  1514 Urinalysis, Routine w reflex microscopic Urine, Clean Catch No signs of urinary tract infection [HS]    Clinical Course User Index [HS] Sherrill Raring, PA-C   MDM Rules/Calculators/A&P                           Stable vitals, patient not septic.  No tachycardia, tachypnea  or hypoxia.  No signs of respiratory distress, physical exam unremarkable patient is not fluid overloaded.  Basic labs ordered as well as a chest x-ray given cough and patient's age as well as comorbidities.  Patient is mildly hyponatremic at 129. Per chart review her sodium was about 133 a month ago so this is not grossly removed from baseline.  Radiograph is notable for pneumonia in the left middle lung.  PCN allergic, will give Rocephin.  We will start the patient on antibiotics and have her repeat the x-ray to confirm resolution.    Do not think additional work-up is required at this time, patient discharged in stable condition.  Discussed HPI, physical exam and plan of care for this patient with attending Shirlyn Goltz. The attending physician evaluated this patient as part of a shared visit and agrees with plan of care.  Final Clinical Impression(s) / ED Diagnoses Final diagnoses:  Hyponatremia  Community acquired pneumonia of right middle lobe of lung    Rx / DC Orders ED Discharge Orders          Ordered    azithromycin (ZITHROMAX) 250 MG tablet  Daily  02/02/21 1516    amoxicillin-clavulanate (AUGMENTIN) 875-125 MG tablet  Every 12 hours        02/02/21 1516             Sherrill Raring, PA-C 02/02/21 1549    Drenda Freeze, MD 02/02/21 [phone]

## 2021-02-02 NOTE — ED Triage Notes (Signed)
Cough x 2 days, sore throat at times; sts she gets PNA easily

## 2021-02-02 NOTE — Discharge Instructions (Addendum)
Your sodium was low today.  Continue to eat food at home, follow-up with your primary care doctor about this abnormal finding. You also have community-acquired pneumonia.  Please take the antibiotics as detailed below: Take azithromycin twice on the first day.  After that take 1 pill daily until finished (4 days total). Take keflex 4x daily for 5 days.

## 2021-02-19 ENCOUNTER — Ambulatory Visit
Admission: RE | Admit: 2021-02-19 | Discharge: 2021-02-19 | Disposition: A | Discharge status: Home / Self Care | Payer: Medicare HMO | Source: Ambulatory Visit | Attending: Internal Medicine | Admitting: Internal Medicine

## 2021-02-19 ENCOUNTER — Other Ambulatory Visit: Payer: Self-pay | Admitting: Internal Medicine

## 2021-02-19 DIAGNOSIS — J690 Pneumonitis due to inhalation of food and vomit: Secondary | ICD-10-CM

## 2021-03-02 NOTE — Progress Notes (Signed)
Patient did not show for appointment. Note left for templating purposes    ADVANCED HF CLINIC NOTE  Referring Physician: Mertie Moores, MD Primary Care: Prince Solian, MD Primary Cardiologist: Mertie Moores, MD/HF: Dr. Haroldine Laws Nephrologist: Dr. Joelyn Oms  HPI:  Kristine [name] is a 75 y/o woman with morbid obesity, DM2 for > 20 yrs c/b Charcot foot, HTN, chronic atrial fibrillation, CKD IV referred by Dr. Acie Fredrickson for further evaluation of HF/PAH.   She has a long h/o of diastolic HF and AF. Has been on multiple AADs but now has chronic AF. [place] 4/15 EF 50% mild LVH grade 2 DD. RV mildly reduced   Recently struggling with more volume overload despite increasing doses of diuretics.   Echo 5/22 EF 45-50%. RV severely reduced RVSP 55. D-shaped septum. Severe biatrial enlargement . 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 Diuretics increased and strongly suggested compliance with CPAP and weight loss.    ROS: All systems negative except as listed in HPI, PMH and Problem List.   Past Medical History:  Diagnosis Date   Allergy    Anxiety    Arthritis    Asthma    Atrial fibrillation (HCC)    Cataract    CHF (congestive heart failure) (HCC)    Chronic bronchitis (HCC)    Chronic kidney disease    Clotting disorder (HCC)    Depression    GERD (gastroesophageal reflux disease)    High cholesterol    History of hiatal hernia    HTN (hypertension)    Mild aortic sclerosis    Morbid obesity (HCC)    Neuromuscular disorder (HCC)    Neuropathic pain    Osteoporosis    Persistent atrial fibrillation (HCC)    Scoliosis    Spinal stenosis    Thyroid disease    Type II diabetes mellitus (HCC)    Vitamin D deficiency     Current Outpatient Medications  Medication Sig Dispense Refill   ALPRAZolam (XANAX) 0.25 MG tablet Take 0.125-0.25 mg by mouth 2 (two) times daily as needed for anxiety.   0   azithromycin (ZITHROMAX)  250 MG tablet Take 1 tablet (250 mg total) by mouth daily. Take first 2 tablets together, then 1 every day until finished. 6 tablet 0   BD PEN NEEDLE NANO U/F 32G X 4 MM MISC Inject 1 each as directed See admin instructions. Use pen needles with insulin pens daily  11   cephALEXin (KEFLEX) 500 MG capsule Take 1 capsule (500 mg total) by mouth 4 (four) times daily. 20 capsule 0   ciclopirox (LOPROX) 0.77 % cream Apply 1 application topically 2 (two) times daily. For 4 weeks     diclofenac Sodium (VOLTAREN) 1 % GEL 4 gram qid pn 150 g 11   DULoxetine (CYMBALTA) 30 MG capsule Take 30 mg by mouth daily.     estradiol (ESTRACE) 0.1 MG/GM vaginal cream Place 1 Applicatorful vaginally as needed (Estrace).      furosemide (LASIX) 80 MG tablet Take 1.5 tablets (120 mg total) by mouth 2 (two) times daily. 90 tablet 11   insulin aspart (NOVOLOG) 100 UNIT/ML injection Inject 30 Units into the skin 4 (four) times daily as needed. Use on a sliding scale     Insulin Degludec (TRESIBA FLEXTOUCH) 200 UNIT/ML SOPN Inject 264 Units into the skin as directed. 100 units in the morning, 64 units at lunch, 100 units at night  KLOR-CON M20 20 MEQ tablet Take 60 mEq by mouth 2 (two) times daily.      L-Methylfolate-Algae-B12-B6 (METANX) 3-90.314-2-35 MG CAPS Take 1 tablet by mouth 2 (two) times daily. 60 capsule 11   levothyroxine (SYNTHROID) 75 MCG tablet Take 75 mcg by mouth every morning.     lidocaine-prilocaine (EMLA) cream 1 gram qid prn 30 g 11   LYRICA 50 MG capsule Take 50-150 mg by mouth See admin instructions. Take 50 mg in the morning 50 mg at lunch and up to 150mg  at bedtime  3   metolazone (ZAROXOLYN) 2.5 MG tablet Take 1 tablet (2.5 mg total) by mouth 3 (three) times a week. Mon, Wed, Fri 15 tablet 10   metoprolol succinate (TOPROL-XL) 50 MG 24 hr tablet TAKE ONE TABLET BY MOUTH TWICE DAILY take with or immediately following a meal 30 tablet 6   nystatin (MYCOSTATIN/NYSTOP) powder Apply 1 application  topically as needed.     spironolactone (ALDACTONE) 25 MG tablet Take 1 tablet (25 mg total) by mouth daily. 90 tablet 3   traMADol (ULTRAM) 50 MG tablet Take 50 mg by mouth 3 (three) times daily as needed.     triamcinolone ointment (KENALOG) 0.1 % Apply 1 application topically as needed.     ULORIC 80 MG TABS Take 1 tablet by mouth daily.  3   Vitamin D, Ergocalciferol, (DRISDOL) 50000 UNITS CAPS Take 50,000 Units by mouth every 7 (seven) days. Takes on Thursday     XARELTO 15 MG TABS tablet Take 15 mg by mouth daily with supper.      No current facility-administered medications for this encounter.    Allergies  Allergen Reactions   Albuterol Other (See Comments)    REACTION: Tachycardia- AFib   Epinephrine Other (See Comments)    Increases heart rate per patient.    Penicillins Other (See Comments)    REACTION: flushing \\T \ hot  Did it involve swelling of the face/tongue/throat, SOB, or low BP? No Did it involve sudden or severe rash/hives, skin peeling, or any reaction on the inside of your mouth or nose? No Did you need to seek medical attention at a hospital or doctor's office? No When did it last happen?    20 years ago   If all above answers are "NO", may proceed with cephalosporin use.   Tizanidine Other (See Comments)    lethargic   Torsemide Swelling   Bee Venom Other (See Comments)    "makes me nervous"   Ivp Dye [Iodinated Diagnostic Agents] Other (See Comments)    flushing      Social History   Socioeconomic History   Marital status: Married    Spouse name: Not on file   Number of children: 4   Years of education: 14   Highest education level: Not on file  Occupational History   Occupation: Retired  Tobacco Use   Smoking status: Never   Smokeless tobacco: Never  Vaping Use   Vaping Use: Never used  Substance and Sexual Activity   Alcohol use: No   Drug use: No   Sexual activity: Yes  Other Topics Concern   Not on file  Social History Narrative    Pt lives in [place] with spouse.   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   Right-handed.   1 cup caffeine per day.   Social Determinants of Health  Financial Resource Strain: Not on file  Food Insecurity: Not on file  Transportation Needs: Not on file  Physical Activity: Not on file  Stress: Not on file  Social Connections: Not on file  Intimate Partner Violence: Not on file      Family History  Problem Relation Age of Onset   Stroke Father    Hypertension Father    Atrial fibrillation Father        HAD MURMUR   Heart failure Mother    Congestive Heart Failure Mother    Hypertension Mother    Hypertension Brother    Hypertension Sister    Hypertension Son    CAD Sister        EARLY   CAD Brother        EARLY   Colon cancer Neg Hx    Esophageal cancer Neg Hx    Stomach cancer Neg Hx    Rectal cancer Neg Hx     There were no vitals filed for this visit.    PHYSICAL EXAM: General:  Well appearing. No resp difficulty HEENT: normal Neck: supple. +JVD to jaw.  Carotids 2+ bilat; no bruits. No lymphadenopathy or thryomegaly appreciated. Cor: PMI nondisplaced. Irregular rhythm. No rubs, gallops or murmurs. Lungs: clear Abdomen: obese, distended. No hepatosplenomegaly. No bruits or masses. Good bowel sounds. Extremities: no cyanosis, clubbing, rash, 2+ pretibial edema Neuro: alert & orientedx3, cranial nerves grossly intact. moves all 4 extremities w/o difficulty. Affect pleasant   ECG: AF 90s, low voltage   ASSESSMENT & PLAN:  1. Acute on chronic diastolic HF with RV failure - Echo 5/22 EF 45-50%. RV severely reduced RVSP 55. D-shaped septum. Severe biatrial enlargement . Moderate pericardial effusion  - She has R>>L heart failure. Based on her echo suspect she likely has diastolic dysfunction with restrictive physiology with secondary pulmonary HTN and R-sided  HF. OSA/OHS also likely a major contributor to RV dysfunction/PH as well - Volume status ok on lasix 120 bid and metolazone 3 times weekly (M, W, F). - Continue 25 mg spiro daily. - ContinueTED hose. - Can consider bumetadine down the road if needed. No Torsemide as she is allergic. - Unfortunately not candidate for SGLT2i given frequent groin yeast infections She has cor pulmonale likely due to OSA/OHS and long-standing diastolic dysfunction. Discussed pathophysiology of this. Stressed need to control BP, treat OSA and lose weight (she has gained 150 lbs since HS. Suggested [place]). Also discussed need to be compliant with diuretic regimen as she is currently overloaded. Check labs today. Can consider RHC in future to further assess hemodynamics as needed.   2. Permanent AF - rate controlled with 50 mg toprol xl BID - continue Xarelto   3. CKD IV - baseline creatinine 1.8-2.0, 1.98 in May - BMET today - followed by Ren[facility]  - Not SGLT2i candidate as above due to yeast infections  4. Morbid obesity  - Discussed the critical importance of weight loss with low carbs and calorie restriction  5. OSA - Moderate - Will be starting CPAP  F/u: 4 months with echo prior   Glori Bickers, MD  10:42 PM

## 2021-03-03 ENCOUNTER — Ambulatory Visit (HOSPITAL_COMMUNITY): Payer: Medicare HMO

## 2021-03-03 ENCOUNTER — Inpatient Hospital Stay (HOSPITAL_BASED_OUTPATIENT_CLINIC_OR_DEPARTMENT_OTHER)
Admission: RE | Admit: 2021-03-03 | Discharge: 2021-03-03 | Disposition: A | Discharge status: Home / Self Care | Payer: Medicare HMO | Source: Ambulatory Visit | Attending: Internal Medicine | Admitting: Internal Medicine

## 2021-03-03 DIAGNOSIS — I50810 Right heart failure, unspecified: Secondary | ICD-10-CM

## 2021-03-06 ENCOUNTER — Telehealth: Payer: Self-pay | Admitting: Neurology

## 2021-03-06 NOTE — Telephone Encounter (Signed)
OT Melissa @ [facility] has called for verbal orders for home health OT and to request to re certify with 1 week 4

## 2021-03-06 NOTE — Telephone Encounter (Signed)
I returned the call to [facility]. She will proceed with the orders as outlined below.

## 2021-03-20 DIAGNOSIS — N952 Postmenopausal atrophic vaginitis: Secondary | ICD-10-CM | POA: Insufficient documentation

## 2021-03-24 ENCOUNTER — Emergency Department (HOSPITAL_BASED_OUTPATIENT_CLINIC_OR_DEPARTMENT_OTHER): Payer: Medicare HMO

## 2021-03-24 ENCOUNTER — Emergency Department (HOSPITAL_BASED_OUTPATIENT_CLINIC_OR_DEPARTMENT_OTHER)
Admission: EM | Admit: 2021-03-24 | Discharge: 2021-03-24 | Disposition: A | Discharge status: Home / Self Care | Payer: Medicare HMO | Attending: Emergency Medicine | Admitting: Emergency Medicine

## 2021-03-24 ENCOUNTER — Encounter (HOSPITAL_BASED_OUTPATIENT_CLINIC_OR_DEPARTMENT_OTHER): Payer: Self-pay | Admitting: Emergency Medicine

## 2021-03-24 ENCOUNTER — Other Ambulatory Visit: Payer: Self-pay

## 2021-03-24 DIAGNOSIS — M79672 Pain in left foot: Secondary | ICD-10-CM | POA: Diagnosis present

## 2021-03-24 DIAGNOSIS — L97422 Non-pressure chronic ulcer of left heel and midfoot with fat layer exposed: Secondary | ICD-10-CM | POA: Insufficient documentation

## 2021-03-24 DIAGNOSIS — Z794 Long term (current) use of insulin: Secondary | ICD-10-CM | POA: Diagnosis not present

## 2021-03-24 DIAGNOSIS — L97522 Non-pressure chronic ulcer of other part of left foot with fat layer exposed: Secondary | ICD-10-CM

## 2021-03-24 DIAGNOSIS — M792 Neuralgia and neuritis, unspecified: Secondary | ICD-10-CM

## 2021-03-24 DIAGNOSIS — E1140 Type 2 diabetes mellitus with diabetic neuropathy, unspecified: Secondary | ICD-10-CM | POA: Insufficient documentation

## 2021-03-24 DIAGNOSIS — E11610 Type 2 diabetes mellitus with diabetic neuropathic arthropathy: Secondary | ICD-10-CM

## 2021-03-24 LAB — BASIC METABOLIC PANEL
Anion gap: 14 (ref 5–15)
BUN: 72 mg/dL — ABNORMAL HIGH (ref 8–23)
CO2: 27 mmol/L (ref 22–32)
Calcium: 9.3 mg/dL (ref 8.9–10.3)
Chloride: 96 mmol/L — ABNORMAL LOW (ref 98–111)
Creatinine, Ser: 2.59 mg/dL — ABNORMAL HIGH (ref 0.44–1.00)
GFR, Estimated: 19 mL/min — ABNORMAL LOW (ref >60)
Glucose, Bld: 119 mg/dL — ABNORMAL HIGH (ref 70–99)
Potassium: 3.4 mmol/L — ABNORMAL LOW (ref 3.5–5.1)
Sodium: 137 mmol/L (ref 135–145)

## 2021-03-24 LAB — CBC WITH DIFFERENTIAL/PLATELET
Abs Immature Granulocytes: 0.08 10*3/uL — ABNORMAL HIGH (ref 0.00–0.07)
Basophils Absolute: 0.1 10*3/uL (ref 0.0–0.1)
Basophils Relative: 1 %
Eosinophils Absolute: 0.3 10*3/uL (ref 0.0–0.5)
Eosinophils Relative: 3 %
HCT: 35.8 % — ABNORMAL LOW (ref 36.0–46.0)
Hemoglobin: 11.2 g/dL — ABNORMAL LOW (ref 12.0–15.0)
Immature Granulocytes: 1 %
Lymphocytes Relative: 20 %
Lymphs Abs: 2.4 10*3/uL (ref 0.7–4.0)
MCH: 26.8 pg (ref 26.0–34.0)
MCHC: 31.3 g/dL (ref 30.0–36.0)
MCV: 85.6 fL (ref 80.0–100.0)
Monocytes Absolute: 1.2 10*3/uL — ABNORMAL HIGH (ref 0.1–1.0)
Monocytes Relative: 10 %
Neutro Abs: 8.2 10*3/uL — ABNORMAL HIGH (ref 1.7–7.7)
Neutrophils Relative %: 65 %
Platelets: 150 10*3/uL (ref 150–400)
RBC: 4.18 MIL/uL (ref 3.87–5.11)
RDW: 19.7 % — ABNORMAL HIGH (ref 11.5–15.5)
WBC: 12.2 10*3/uL — ABNORMAL HIGH (ref 4.0–10.5)
nRBC: 0 % (ref 0.0–0.2)

## 2021-03-24 LAB — SEDIMENTATION RATE: Sed Rate: 21 mm/hr (ref 0–22)

## 2021-03-24 LAB — C-REACTIVE PROTEIN: CRP: 0.5 mg/dL (ref <1.0)

## 2021-03-24 MED ORDER — OXYCODONE-ACETAMINOPHEN 5-325 MG PO TABS: 1.0000 | ORAL_TABLET | Freq: Four times a day (QID) | ORAL | 0 refills | Status: DC | PRN

## 2021-03-24 MED ORDER — LIDOCAINE 5 % EX PTCH
1.0000 | MEDICATED_PATCH | CUTANEOUS | Status: DC
  Administered 2021-03-24: 1 via TRANSDERMAL
  Filled 2021-03-24: qty 1

## 2021-03-24 MED ORDER — POTASSIUM CHLORIDE CRYS ER 20 MEQ PO TBCR
40.0000 meq | EXTENDED_RELEASE_TABLET | Freq: Once | ORAL | Status: AC
  Administered 2021-03-24: 40 meq via ORAL
  Filled 2021-03-24: qty 2

## 2021-03-24 MED ORDER — OXYCODONE-ACETAMINOPHEN 5-325 MG PO TABS
1.0000 | ORAL_TABLET | Freq: Once | ORAL | Status: AC
Start: 2021-03-24 — End: 2021-03-24
  Administered 2021-03-24: 1 via ORAL
  Filled 2021-03-24: qty 1

## 2021-03-24 NOTE — ED Provider Notes (Signed)
[facility] EMERGENCY DEPARTMENT Provider Note   CSN: [id number] Arrival date & time: 03/24/21  0600     History  Chief Complaint  Patient presents with   Foot Pain    Sadi Arave is a 76 y.o. female.  Patient c/o left foot pain. States hx dm and associated neuropathy, Charcot foot/prior surgery, small ulcer to plantar aspect foot near distal 1st MT, and chronic/recurrent 'nerve pain' to dorsum of foot. States the small area to dorsum of mid foot where she has her chronic pain has been worse in past day. Describes sharp, electric/shock like pain to focal area on dorsum foot, intermittent, persistent - each episode lasting seconds, but recurrent. Denies increased foot swelling. No redness. No worsening appearing to ulcer on plantar side of foot - no malodor or purulent drainage from area. No change in meds. No lower leg/leg swelling or pain. No claudication. Symptoms occur at rest, occasionally worse at night. No fever or chills.   The history is provided by the patient, the spouse and medical records.  Foot Pain Pertinent negatives include no chest pain, no abdominal pain, no headaches and no shortness of breath.      Home Medications Prior to Admission medications   Medication Sig Start Date End Date Taking? Authorizing Provider  ALPRAZolam (XANAX) 0.25 MG tablet Take 0.125-0.25 mg by mouth 2 (two) times daily as needed for anxiety.  03/26/14   [provider]  azithromycin (ZITHROMAX) 250 MG tablet Take 1 tablet (250 mg total) by mouth daily. Take first 2 tablets together, then 1 every day until finished. 02/02/21   Sherrill Raring, PA-C  BD PEN NEEDLE NANO U/F 32G X 4 MM MISC Inject 1 each as directed See admin instructions. Use pen needles with insulin pens daily 12/24/14   [provider]  cephALEXin (KEFLEX) 500 MG capsule Take 1 capsule (500 mg total) by mouth 4 (four) times daily. 02/02/21   Sherrill Raring, PA-C  ciclopirox (LOPROX) 0.77 % cream Apply  1 application topically 2 (two) times daily. For 4 weeks 01/13/19   [provider]  diclofenac Sodium (VOLTAREN) 1 % GEL 4 gram qid pn 01/07/21   Marcial Pacas, MD  DULoxetine (CYMBALTA) 30 MG capsule Take 30 mg by mouth daily. 09/26/20   [provider]  estradiol (ESTRACE) 0.1 MG/GM vaginal cream Place 1 Applicatorful vaginally as needed (Estrace).  12/18/16   [provider]  furosemide (LASIX) 80 MG tablet Take 1.5 tablets (120 mg total) by mouth 2 (two) times daily. 07/16/20   Nahser, Wonda Cheng, MD  insulin aspart (NOVOLOG) 100 UNIT/ML injection Inject 30 Units into the skin 4 (four) times daily as needed. Use on a sliding scale 01/05/19   [provider]  Insulin Degludec (TRESIBA FLEXTOUCH) 200 UNIT/ML SOPN Inject 264 Units into the skin as directed. 100 units in the morning, 64 units at lunch, 100 units at night    [provider]  KLOR-CON M20 20 MEQ tablet Take 60 mEq by mouth 2 (two) times daily.  05/18/18   [provider]  L-Methylfolate-Algae-B12-B6 Glade Stanford) 3-90.314-2-35 MG CAPS Take 1 tablet by mouth 2 (two) times daily. 01/07/21   Marcial Pacas, MD  levothyroxine (SYNTHROID) 75 MCG tablet Take 75 mcg by mouth every morning. 06/20/19   [provider]  lidocaine-prilocaine (EMLA) cream 1 gram qid prn 01/07/21   Marcial Pacas, MD  LYRICA 50 MG capsule Take 50-150 mg by mouth See admin instructions. Take 50 mg in the  morning 50 mg at lunch and up to 150mg  at bedtime 06/01/17   [provider]  metolazone (ZAROXOLYN) 2.5 MG tablet Take 1 tablet (2.5 mg total) by mouth 3 (three) times a week. Mon, Wed, Fri 08/16/19   Nahser, Wonda Cheng, MD  metoprolol succinate (TOPROL-XL) 50 MG 24 hr tablet TAKE ONE TABLET BY MOUTH TWICE DAILY take with or immediately following a meal 09/26/20   Nahser, Wonda Cheng, MD  nystatin (MYCOSTATIN/NYSTOP) powder Apply 1 application topically as needed. 01/13/19   [provider]  spironolactone (ALDACTONE)  25 MG tablet Take 1 tablet (25 mg total) by mouth daily. 07/17/20   Nahser, Wonda Cheng, MD  traMADol (ULTRAM) 50 MG tablet Take 50 mg by mouth 3 (three) times daily as needed. 06/24/19   [provider]  triamcinolone ointment (KENALOG) 0.1 % Apply 1 application topically as needed. 04/13/19   [provider]  ULORIC 80 MG TABS Take 1 tablet by mouth daily. 04/29/17   [provider]  Vitamin D, Ergocalciferol, (DRISDOL) 50000 UNITS CAPS Take 50,000 Units by mouth every 7 (seven) days. Takes on Thursday    [provider]  XARELTO 15 MG TABS tablet Take 15 mg by mouth daily with supper.  06/27/16   [provider]      Allergies    Albuterol, Epinephrine, Penicillins, Tizanidine, Torsemide, Bee venom, and Ivp dye [iodinated contrast media]    Review of Systems   Review of Systems  Constitutional:  Negative for chills and fever.  HENT:  Negative for sore throat.   Eyes:  Negative for redness.  Respiratory:  Negative for shortness of breath.   Cardiovascular:  Negative for chest pain.  Gastrointestinal:  Negative for abdominal pain.  Genitourinary:  Negative for flank pain.  Musculoskeletal:  Negative for back pain.  Skin:  Negative for rash.  Neurological:  Negative for headaches.  Hematological:  Does not bruise/bleed easily.  Psychiatric/Behavioral:  Negative for confusion.    Physical Exam Updated Vital Signs BP 102/63 (BP Location: Right Arm)    Pulse (!) 105    Temp 98.2 F (36.8 C) (Oral)    Resp 18    SpO2 92%  Physical Exam Vitals and nursing note reviewed.  Constitutional:      Appearance: Normal appearance. She is well-developed.  HENT:     Head: Atraumatic.     Nose: Nose normal.     Mouth/Throat:     Mouth: Mucous membranes are moist.  Eyes:     General: No scleral icterus.    Conjunctiva/sclera: Conjunctivae normal.  Neck:     Trachea: No tracheal deviation.  Cardiovascular:     Rate and Rhythm: Normal rate.     Pulses:  Normal pulses.  Pulmonary:     Effort: Pulmonary effort is normal. No respiratory distress.  Abdominal:     General: There is no distension.  Genitourinary:    Comments: No cva tenderness.  Musculoskeletal:        General: No swelling.     Cervical back: Normal range of motion and neck supple. No rigidity. No muscular tenderness.     Comments: Left foot of normal color and warmth. Dp/pt palp. Normal cap refill in toes. Skin on dorsum foot tender/painful w even very light touch. Small, < 1 cm ulcer to plantar aspect foot without purulent drainage, no necrotic or devitalized tissue, no malodor. No LLE swelling.   Skin:    General: Skin is warm and dry.  Findings: No rash.  Neurological:     Mental Status: She is alert.     Comments: Alert, speech normal. Left foot motor/sens grossly intact.   Psychiatric:        Mood and Affect: Mood normal.    ED Results / Procedures / Treatments   Labs (all labs ordered are listed, but only abnormal results are displayed) Results for orders placed or performed during the hospital encounter of 03/24/21  CBC with Differential/Platelet  Result Value Ref Range   WBC 12.2 (H) 4.0 - 10.5 K/uL   RBC 4.18 3.87 - 5.11 MIL/uL   Hemoglobin 11.2 (L) 12.0 - 15.0 g/dL   HCT 35.8 (L) 36.0 - 46.0 %   MCV 85.6 80.0 - 100.0 fL   MCH 26.8 26.0 - 34.0 pg   MCHC 31.3 30.0 - 36.0 g/dL   RDW 19.7 (H) 11.5 - 15.5 %   Platelets 150 150 - 400 K/uL   nRBC 0.0 0.0 - 0.2 %   Neutrophils Relative % 65 %   Neutro Abs 8.2 (H) 1.7 - 7.7 K/uL   Lymphocytes Relative 20 %   Lymphs Abs 2.4 0.7 - 4.0 K/uL   Monocytes Relative 10 %   Monocytes Absolute 1.2 (H) 0.1 - 1.0 K/uL   Eosinophils Relative 3 %   Eosinophils Absolute 0.3 0.0 - 0.5 K/uL   Basophils Relative 1 %   Basophils Absolute 0.1 0.0 - 0.1 K/uL   Immature Granulocytes 1 %   Abs Immature Granulocytes 0.08 (H) 0.00 - 0.07 K/uL  Basic metabolic panel  Result Value Ref Range   Sodium 137 135 - 145 mmol/L    Potassium 3.4 (L) 3.5 - 5.1 mmol/L   Chloride 96 (L) 98 - 111 mmol/L   CO2 27 22 - 32 mmol/L   Glucose, Bld 119 (H) 70 - 99 mg/dL   BUN 72 (H) 8 - 23 mg/dL   Creatinine, Ser 2.59 (H) 0.44 - 1.00 mg/dL   Calcium 9.3 8.9 - 10.3 mg/dL   GFR, Estimated 19 (L) >60 mL/min   Anion gap 14 5 - 15  Sedimentation rate  Result Value Ref Range   Sed Rate 21 0 - 22 mm/hr   DG Foot Complete Left  Result Date: 03/24/2021 CLINICAL DATA:  Foot wound EXAM: LEFT FOOT - COMPLETE 3+ VIEW COMPARISON:  None. FINDINGS: Arthrodesis screws spanning the midfoot to calcaneus and the first metatarsal to talus. There is lucency around both of these, especially at the talus and first metatarsal head. Very degenerated midfoot. Widening at the Lisfranc interval between the first and second rays. Soft tissue swelling which is generalized. On the lateral view there is question of bubble of gas at the heel soft tissues. IMPRESSION: 1. 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 2. Widening at the Lisfranc interval. 3. Soft tissue swelling and possible gas at the heel. No opaque foreign body. Electronically Signed   By: Jorje Guild M.D.   On: 03/24/2021 07:11     EKG None  Radiology No results found.  Procedures Procedures    Medications Ordered in ED Medications - No data to display  ED Course/ Medical Decision Making/ A&P                           Medical Decision Making  Labs and imaging ordered.   Diff dx incl diabetic neuropathy, cellulitis, stress fracture, etc. Considered parenteral therapy and possible need for admission.  Labs  reviewed/interpreted by me - wbc normal.   Xrays reviewed/interpreted by me - degenerative changes. There is no heel area pain, swelling or tenderness.   Percocet po.  Lidocaine patch.  On chart review and discussion with pt/spouse, pt already on lyrica, cymbalta, voltaren gel and ultram for same.   Recheck pain controlled.    Rx for home.   Currently, no  fever, chills or sweats. No purulent drainage, cellulitis, malodor, or other sign of acute infection at/around small plantar foot ulcer site.   Rec close pcp f/u.  Return precautions provided.            Final Clinical Impression(s) / ED Diagnoses Final diagnoses:  None    Rx / DC Orders ED Discharge Orders     None         Lajean Saver, MD 03/24/21 1148

## 2021-03-24 NOTE — ED Provider Notes (Signed)
Emergency Medicine Provider Triage Evaluation Note  Kristine [name] , a 76 y.o. female  was evaluated in triage.  Pt complains of left foot pain.  She has a known diabetic wound on this foot for the past 90 days.  Also has chronic pain from neuropathy and Charcot foot.  Increased pain since last night without trauma.  Uncontrolled with her home pain medications.  No fever.  No bleeding or drainage from wound..  Review of Systems  Positive: Foot pain, foot ulcer Negative: Fever, vomiting  Physical Exam  BP 102/63 (BP Location: Right Arm)    Pulse (!) 105    Temp 98.2 F (36.8 C) (Oral)    Resp 18    SpO2 92%  Gen:   Awake, no distress   Resp:  Normal effort  MSK:   Moves extremities without difficulty  Other:  Chronic deformity to left foot and ankle.  Intact DP pulse.  Clean appearing ulcer overlying first plantar MTP surface  Medical Decision Making  Medically screening exam initiated at 7:09 AM.  Appropriate orders placed.  Kristine [name] was informed that the remainder of the evaluation will be completed by another provider, this initial triage assessment does not replace that evaluation, and the importance of remaining in the ED until their evaluation is complete.  No evidence of neurovascular compromise, labs and x-ray to be obtained   Ezequiel Essex, MD 03/24/21 0710

## 2021-03-24 NOTE — ED Triage Notes (Signed)
Patient coming to ED for evaluation of L foot pain.  Reports pain started yesterday morning.  Called nurse advice line and was instructed to take her normal pain medication.  Has tried Tramadol without relief.  Tried heating pad and multiple OTC remedies without relief.  Has wound noted to bottom of L foot

## 2021-03-24 NOTE — ED Notes (Addendum)
Meds administered in applesauce

## 2021-03-24 NOTE — Discharge Instructions (Addendum)
It was our pleasure to provide your ER care today - we hope that you feel better.  Continue your meds (cymbalta, lyrica). Use voltaren gel and/or lidocaine patch to area of pain as need. You may also take percocet as need for pain. No driving for the next 6 hours or when taking percocet. Also, do not take tylenol or acetaminophen containing medication when taking percocet.   Keep small foot ulcer very clean. Avoid any pressure to area.   Follow up closely with your doctor in the coming week for recheck - call office to arrange appointment.   Return to ER if worse, new symptoms, fevers, increased swelling or spreading redness to foot, infection of foot ulcer (redness, pus/draining, malodor, etc.), or other concern.

## 2021-04-03 ENCOUNTER — Encounter (HOSPITAL_BASED_OUTPATIENT_CLINIC_OR_DEPARTMENT_OTHER): Payer: Self-pay | Admitting: *Deleted

## 2021-04-03 ENCOUNTER — Telehealth: Payer: Self-pay | Admitting: Neurology

## 2021-04-03 ENCOUNTER — Other Ambulatory Visit: Payer: Self-pay

## 2021-04-03 DIAGNOSIS — S99922A Unspecified injury of left foot, initial encounter: Secondary | ICD-10-CM | POA: Diagnosis present

## 2021-04-03 DIAGNOSIS — I130 Hypertensive heart and chronic kidney disease with heart failure and stage 1 through stage 4 chronic kidney disease, or unspecified chronic kidney disease: Secondary | ICD-10-CM | POA: Insufficient documentation

## 2021-04-03 DIAGNOSIS — J45909 Unspecified asthma, uncomplicated: Secondary | ICD-10-CM | POA: Diagnosis not present

## 2021-04-03 DIAGNOSIS — Z7901 Long term (current) use of anticoagulants: Secondary | ICD-10-CM | POA: Insufficient documentation

## 2021-04-03 DIAGNOSIS — Z23 Encounter for immunization: Secondary | ICD-10-CM | POA: Diagnosis not present

## 2021-04-03 DIAGNOSIS — Z79899 Other long term (current) drug therapy: Secondary | ICD-10-CM | POA: Insufficient documentation

## 2021-04-03 DIAGNOSIS — I509 Heart failure, unspecified: Secondary | ICD-10-CM | POA: Diagnosis not present

## 2021-04-03 DIAGNOSIS — N189 Chronic kidney disease, unspecified: Secondary | ICD-10-CM | POA: Diagnosis not present

## 2021-04-03 DIAGNOSIS — S91312A Laceration without foreign body, left foot, initial encounter: Secondary | ICD-10-CM | POA: Diagnosis not present

## 2021-04-03 DIAGNOSIS — E119 Type 2 diabetes mellitus without complications: Secondary | ICD-10-CM | POA: Insufficient documentation

## 2021-04-03 DIAGNOSIS — Z794 Long term (current) use of insulin: Secondary | ICD-10-CM | POA: Insufficient documentation

## 2021-04-03 DIAGNOSIS — W268XXA Contact with other sharp object(s), not elsewhere classified, initial encounter: Secondary | ICD-10-CM | POA: Diagnosis not present

## 2021-04-03 NOTE — Telephone Encounter (Signed)
OT Melissa @ [facility] has called re: verbal orders for OT Effective dates 03-11-21 to 04-18-21 1 visit every 3 weeks 2 week 1 and 1 week 1.  To complete plan of care.  Melissa confirmed her vm is secure

## 2021-04-03 NOTE — Telephone Encounter (Signed)
Left message with okay to move forward with the orders below to meet the patient's needs.

## 2021-04-03 NOTE — ED Triage Notes (Signed)
C/o left toe lac x 2 hrs ago

## 2021-04-04 ENCOUNTER — Emergency Department (HOSPITAL_BASED_OUTPATIENT_CLINIC_OR_DEPARTMENT_OTHER)
Admission: EM | Admit: 2021-04-04 | Discharge: 2021-04-04 | Disposition: A | Discharge status: Home / Self Care | Payer: Medicare HMO | Attending: Emergency Medicine | Admitting: Emergency Medicine

## 2021-04-04 DIAGNOSIS — S91312A Laceration without foreign body, left foot, initial encounter: Secondary | ICD-10-CM

## 2021-04-04 MED ORDER — DOXYCYCLINE HYCLATE 100 MG PO CAPS: 100.0000 mg | ORAL_CAPSULE | Freq: Two times a day (BID) | ORAL | 0 refills | Status: DC

## 2021-04-04 MED ORDER — TETANUS-DIPHTH-ACELL PERTUSSIS 5-2.5-18.5 LF-MCG/0.5 IM SUSY
0.5000 mL | PREFILLED_SYRINGE | Freq: Once | INTRAMUSCULAR | Status: AC
  Administered 2021-04-04: 0.5 mL via INTRAMUSCULAR
  Filled 2021-04-04: qty 0.5

## 2021-04-04 MED ORDER — CEPHALEXIN 250 MG PO CAPS
500.0000 mg | ORAL_CAPSULE | Freq: Once | ORAL | Status: DC
  Filled 2021-04-04: qty 2

## 2021-04-04 MED ORDER — LIDOCAINE HCL 2 % IJ SOLN
10.0000 mL | Freq: Once | INTRAMUSCULAR | Status: AC
  Administered 2021-04-04: 200 mg
  Filled 2021-04-04: qty 20

## 2021-04-04 MED ORDER — DOXYCYCLINE HYCLATE 100 MG PO TABS
100.0000 mg | ORAL_TABLET | Freq: Once | ORAL | Status: AC
Start: 2021-04-04 — End: 2021-04-04
  Administered 2021-04-04: 100 mg via ORAL
  Filled 2021-04-04: qty 1

## 2021-04-04 NOTE — ED Provider Notes (Signed)
[facility] DEPT [facility] Provider Note: Kristine Spurling, MD, FACEP  CSN: [id number] MRN: [id number] ARRIVAL: 04/03/21 at [place]: [place]  Extremity Laceration   HISTORY OF PRESENT ILLNESS  04/04/21 12:48 AM Kristine [name] is a 76 y.o. female who cut the web between her left fourth and fifth toe about 2 hours prior to arrival.  She cut it on her wheelchair.  She rates associated pain as a 5 out of 10, throbbing in nature.  Bleeding has been controlled with pressure.  She is not sure of her tetanus status.   Past Medical History:  Diagnosis Date   Allergy    Anxiety    Arthritis    Asthma    Atrial fibrillation (HCC)    Cataract    CHF (congestive heart failure) (HCC)    Chronic bronchitis (HCC)    Chronic kidney disease    Clotting disorder (HCC)    Depression    GERD (gastroesophageal reflux disease)    High cholesterol    History of hiatal hernia    HTN (hypertension)    Mild aortic sclerosis    Morbid obesity (HCC)    Neuromuscular disorder (HCC)    Neuropathic pain    Osteoporosis    Persistent atrial fibrillation ([place])    Scoliosis    Spinal stenosis    Thyroid disease    Type II diabetes mellitus ([place])    Vitamin D deficiency     Past Surgical History:  Procedure Laterality Date   BLADDER SUSPENSION  1980s   CARDIAC CATHETERIZATION  11/16/09   SMOOTH AND NORMAL   CARDIOVERSION N/A 01/07/2015   Procedure: CARDIOVERSION;  Surgeon: Skeet Latch, MD;  Location: [facility];  Service: Cardiovascular;  Laterality: N/A;   CARDIOVERSION N/A 03/11/2015   Procedure: CARDIOVERSION;  Surgeon: Skeet Latch, MD;  Location: [facility];  Service: Cardiovascular;  Laterality: N/A;   CARPAL TUNNEL RELEASE Left 1970s   CATARACT EXTRACTION W/ INTRAOCULAR LENS  IMPLANT, BILATERAL Bilateral ~ 2010   COLONOSCOPY  08/14/08   FINGER FRACTURE SURGERY Right 1970s   "ring finger"   FRACTURE SURGERY     LAPAROSCOPIC CHOLECYSTECTOMY  1980s    TUBAL LIGATION     VAGINAL HYSTERECTOMY  1980    Family History  Problem Relation Age of Onset   Stroke Father    Hypertension Father    Atrial fibrillation Father        HAD MURMUR   Heart failure Mother    Congestive Heart Failure Mother    Hypertension Mother    Hypertension Brother    Hypertension Sister    Hypertension Son    CAD Sister        EARLY   CAD Brother        EARLY   Colon cancer Neg Hx    Esophageal cancer Neg Hx    Stomach cancer Neg Hx    Rectal cancer Neg Hx     Social History   Tobacco Use   Smoking status: Never   Smokeless tobacco: Never  Vaping Use   Vaping Use: Never used  Substance Use Topics   Alcohol use: No   Drug use: No    Prior to Admission medications   Medication Sig Start Date End Date Taking? Authorizing Provider  doxycycline (VIBRAMYCIN) 100 MG capsule Take 1 capsule (100 mg total) by mouth 2 (two) times daily. One po bid x 7 days 04/04/21  Yes Shandy Vi, Jenny Reichmann, MD  ALPRAZolam Duanne Moron)  0.25 MG tablet Take 0.125-0.25 mg by mouth 2 (two) times daily as needed for anxiety.  03/26/14   [provider]  BD PEN NEEDLE NANO U/F 32G X 4 MM MISC Inject 1 each as directed See admin instructions. Use pen needles with insulin pens daily 12/24/14   [provider]  ciclopirox (LOPROX) 0.77 % cream Apply 1 application topically 2 (two) times daily. For 4 weeks 01/13/19   [provider]  diclofenac Sodium (VOLTAREN) 1 % GEL 4 gram qid pn 01/07/21   Marcial Pacas, MD  DULoxetine (CYMBALTA) 30 MG capsule Take 30 mg by mouth daily. 09/26/20   [provider]  estradiol (ESTRACE) 0.1 MG/GM vaginal cream Place 1 Applicatorful vaginally as needed (Estrace).  12/18/16   [provider]  furosemide (LASIX) 80 MG tablet Take 1.5 tablets (120 mg total) by mouth 2 (two) times daily. 07/16/20   Nahser, Wonda Cheng, MD  insulin aspart (NOVOLOG) 100 UNIT/ML injection Inject 30 Units into the skin 4 (four) times daily as needed. Use on  a sliding scale 01/05/19   [provider]  Insulin Degludec (TRESIBA FLEXTOUCH) 200 UNIT/ML SOPN Inject 264 Units into the skin as directed. 100 units in the morning, 64 units at lunch, 100 units at night    [provider]  KLOR-CON M20 20 MEQ tablet Take 60 mEq by mouth 2 (two) times daily.  05/18/18   [provider]  L-Methylfolate-Algae-B12-B6 Glade Stanford) 3-90.314-2-35 MG CAPS Take 1 tablet by mouth 2 (two) times daily. 01/07/21   Marcial Pacas, MD  levothyroxine (SYNTHROID) 75 MCG tablet Take 75 mcg by mouth every morning. 06/20/19   [provider]  lidocaine-prilocaine (EMLA) cream 1 gram qid prn 01/07/21   Marcial Pacas, MD  LYRICA 50 MG capsule Take 50-150 mg by mouth See admin instructions. Take 50 mg in the morning 50 mg at lunch and up to 150mg  at bedtime 06/01/17   [provider]  metolazone (ZAROXOLYN) 2.5 MG tablet Take 1 tablet (2.5 mg total) by mouth 3 (three) times a week. Mon, Wed, Fri 08/16/19   Nahser, Wonda Cheng, MD  metoprolol succinate (TOPROL-XL) 50 MG 24 hr tablet TAKE ONE TABLET BY MOUTH TWICE DAILY take with or immediately following a meal 09/26/20   Nahser, Wonda Cheng, MD  nystatin (MYCOSTATIN/NYSTOP) powder Apply 1 application topically as needed. 01/13/19   [provider]  oxyCODONE-acetaminophen (PERCOCET/ROXICET) 5-325 MG tablet Take 1 tablet by mouth every 6 (six) hours as needed for severe pain. 03/24/21   Lajean Saver, MD  spironolactone (ALDACTONE) 25 MG tablet Take 1 tablet (25 mg total) by mouth daily. 07/17/20   Nahser, Wonda Cheng, MD  traMADol (ULTRAM) 50 MG tablet Take 50 mg by mouth 3 (three) times daily as needed. 06/24/19   [provider]  triamcinolone ointment (KENALOG) 0.1 % Apply 1 application topically as needed. 04/13/19   [provider]  ULORIC 80 MG TABS Take 1 tablet by mouth daily. 04/29/17   [provider]  Vitamin D, Ergocalciferol, (DRISDOL) 50000 UNITS CAPS Take 50,000 Units by mouth  every 7 (seven) days. Takes on Thursday    [provider]  XARELTO 15 MG TABS tablet Take 15 mg by mouth daily with supper.  06/27/16   [provider]    Allergies Albuterol, Epinephrine, Penicillins, Tizanidine, Torsemide, Bee venom, Ivp dye [iodinated contrast media], and Keflex [cephalexin]   REVIEW OF SYSTEMS  Negative except as noted here or in the History of  Present Illness.   PHYSICAL EXAMINATION  Initial Vital Signs Blood pressure 133/76, pulse 90, temperature 98.2 F (36.8 C), temperature source Oral, resp. rate 20, height 5\' 9"  (1.753 m), weight 123.8 kg, SpO2 95 %.  Examination General: Well-developed, well-nourished female in no acute distress; appearance consistent with age of record HENT: normocephalic; atraumatic Eyes: Normal appearance Neck: supple Heart: regular rate and rhythm Lungs: clear to auscultation bilaterally Abdomen: soft; nondistended; nontender; bowel sounds present Extremities: Right foot and ankle in orthotic device; chronic appearing deformity of left ankle (Charcot joint); laceration in web between left fourth and fifth toes with brisk capillary refill of fourth and fifth toes distally Neurologic: Awake, alert and oriented; motor function intact in all extremities and symmetric; no facial droop; decreased sensation of feet Skin: Warm and dry Psychiatric: Normal mood and affect   RESULTS  Summary of this visit's results, reviewed and interpreted by myself:   EKG Interpretation  Date/Time:    Ventricular Rate:    PR Interval:    QRS Duration:   QT Interval:    QTC Calculation:   R Axis:     Text Interpretation:         Laboratory Studies: No results found for this or any previous visit (from the past 24 hour(s)). Imaging Studies: No results found.  ED COURSE and MDM  Nursing notes, initial and subsequent vitals signs, including pulse oximetry, reviewed and interpreted by myself.  Vitals:   04/03/21 2345 04/03/21  2347  BP:  133/76  Pulse:  90  Resp:  20  Temp:  98.2 F (36.8 C)  TempSrc:  Oral  SpO2:  95%  Weight: 123.8 kg   Height: 5\' 9"  (1.753 m)    Medications  lidocaine (XYLOCAINE) 2 % (with pres) injection 200 mg (has no administration in time range)  Tdap (BOOSTRIX) injection 0.5 mL (0.5 mLs Intramuscular Given 04/04/21 0106)  doxycycline (VIBRA-TABS) tablet 100 mg (100 mg Oral Given 04/04/21 0105)   Doxycycline given for infection prophylaxis as patient states she cannot take cephalosporins.  Patient's tetanus updated.  Patient advised to have sutures removed in 10 to 14 days either here or at her primary care physician's office.  She is to return sooner for evidence of infection.   PROCEDURES  Procedures LACERATION REPAIR Performed by: Karen Chafe Tosha Belgarde Authorized by: Karen Chafe Shakea Isip Consent: Verbal consent obtained. Risks and benefits: risks, benefits and alternatives were discussed Consent given by: patient Patient identity confirmed: provided demographic data Prepped and Draped in normal sterile fashion Wound explored  Laceration Location: Between left fourth and fifth toes  Laceration Length: 2.5 cm  No Foreign Bodies seen or palpated  Anesthesia: local infiltration  Local anesthetic: lidocaine 2% without epinephrine  Anesthetic total: 3 ml  Irrigation method: syringe Amount of cleaning: standard  Skin closure: 4-0 Prolene  Number of sutures: 4  Technique: Simple interrupted  Patient tolerance: Patient tolerated the procedure well with no immediate complications.   ED DIAGNOSES     ICD-10-CM   1. Laceration of left foot, initial encounter  A83.419Q          Shanon Rosser, MD 04/04/21 [phone]

## 2021-04-09 ENCOUNTER — Emergency Department (HOSPITAL_COMMUNITY)
Admission: EM | Admit: 2021-04-09 | Discharge: 2021-04-09 | Disposition: A | Discharge status: Home / Self Care | Payer: Medicare HMO | Attending: Emergency Medicine | Admitting: Emergency Medicine

## 2021-04-09 ENCOUNTER — Emergency Department (HOSPITAL_COMMUNITY): Payer: Medicare HMO

## 2021-04-09 ENCOUNTER — Encounter (HOSPITAL_COMMUNITY): Payer: Self-pay

## 2021-04-09 ENCOUNTER — Other Ambulatory Visit: Payer: Self-pay

## 2021-04-09 DIAGNOSIS — R7309 Other abnormal glucose: Secondary | ICD-10-CM | POA: Diagnosis not present

## 2021-04-09 DIAGNOSIS — Z7901 Long term (current) use of anticoagulants: Secondary | ICD-10-CM | POA: Insufficient documentation

## 2021-04-09 DIAGNOSIS — S8992XA Unspecified injury of left lower leg, initial encounter: Secondary | ICD-10-CM | POA: Diagnosis present

## 2021-04-09 DIAGNOSIS — S82002A Unspecified fracture of left patella, initial encounter for closed fracture: Secondary | ICD-10-CM | POA: Diagnosis not present

## 2021-04-09 DIAGNOSIS — W050XXA Fall from non-moving wheelchair, initial encounter: Secondary | ICD-10-CM | POA: Insufficient documentation

## 2021-04-09 LAB — CBG MONITORING, ED: Glucose-Capillary: 186 mg/dL — ABNORMAL HIGH (ref 70–99)

## 2021-04-09 MED ORDER — HYDROMORPHONE HCL 1 MG/ML IJ SOLN
INTRAMUSCULAR | Status: AC
  Filled 2021-04-09: qty 1

## 2021-04-09 MED ORDER — MORPHINE SULFATE (PF) 2 MG/ML IV SOLN
2.0000 mg | Freq: Once | INTRAVENOUS | Status: AC
  Administered 2021-04-09: 2 mg via INTRAVENOUS
  Filled 2021-04-09: qty 1

## 2021-04-09 MED ORDER — HYDROCODONE-ACETAMINOPHEN 5-325 MG PO TABS: 1.0000 | ORAL_TABLET | Freq: Four times a day (QID) | ORAL | 0 refills | Status: DC | PRN

## 2021-04-09 MED ORDER — HYDROMORPHONE HCL 1 MG/ML IJ SOLN
1.0000 mg | Freq: Once | INTRAMUSCULAR | Status: AC
  Administered 2021-04-09: 1 mg via INTRAVENOUS

## 2021-04-09 NOTE — Discharge Instructions (Addendum)
Please refer to the attached instructions. Follow-up with your orthopedist as discussed.

## 2021-04-09 NOTE — ED Provider Notes (Signed)
[facility] EMERGENCY DEPARTMENT Provider Note   CSN: [id number] Arrival date & time: 04/09/21  1807     History  Chief Complaint  Patient presents with   Kristine [name] is a 76 y.o. female.  Patient was attempting to move from the bed to a wheelchair when she fell, landing on her left knee. Patient with left knee pain, with swelling and appearance of deformity of the patella. Patient received 100 mcg of fentanyl via EMS.  The history is provided by the patient. No language interpreter was used.  Fall This is a new problem. The current episode started less than 1 hour ago.  Knee Pain Location:  Knee Injury: yes   Mechanism of injury: fall   Knee location:  L knee Pain details:    Quality:  Throbbing and aching   Radiates to:  Does not radiate Chronicity:  New Prior injury to area:  No Associated symptoms: decreased ROM       Home Medications Prior to Admission medications   Medication Sig Start Date End Date Taking? Authorizing Provider  ALPRAZolam (XANAX) 0.25 MG tablet Take 0.125-0.25 mg by mouth 2 (two) times daily as needed for anxiety.  03/26/14   [provider]  BD PEN NEEDLE NANO U/F 32G X 4 MM MISC Inject 1 each as directed See admin instructions. Use pen needles with insulin pens daily 12/24/14   [provider]  ciclopirox (LOPROX) 0.77 % cream Apply 1 application topically 2 (two) times daily. For 4 weeks 01/13/19   [provider]  diclofenac Sodium (VOLTAREN) 1 % GEL 4 gram qid pn 01/07/21   Marcial Pacas, MD  doxycycline (VIBRAMYCIN) 100 MG capsule Take 1 capsule (100 mg total) by mouth 2 (two) times daily. One po bid x 7 days 04/04/21   Molpus, John, MD  DULoxetine (CYMBALTA) 30 MG capsule Take 30 mg by mouth daily. 09/26/20   [provider]  estradiol (ESTRACE) 0.1 MG/GM vaginal cream Place 1 Applicatorful vaginally as needed (Estrace).  12/18/16   [provider]  furosemide (LASIX) 80 MG tablet Take 1.5  tablets (120 mg total) by mouth 2 (two) times daily. 07/16/20   Nahser, Wonda Cheng, MD  insulin aspart (NOVOLOG) 100 UNIT/ML injection Inject 30 Units into the skin 4 (four) times daily as needed. Use on a sliding scale 01/05/19   [provider]  Insulin Degludec (TRESIBA FLEXTOUCH) 200 UNIT/ML SOPN Inject 264 Units into the skin as directed. 100 units in the morning, 64 units at lunch, 100 units at night    [provider]  KLOR-CON M20 20 MEQ tablet Take 60 mEq by mouth 2 (two) times daily.  05/18/18   [provider]  L-Methylfolate-Algae-B12-B6 [place]) 3-90.314-2-35 MG CAPS Take 1 tablet by mouth 2 (two) times daily. 01/07/21   Marcial Pacas, MD  levothyroxine (SYNTHROID) 75 MCG tablet Take 75 mcg by mouth every morning. 06/20/19   [provider]  lidocaine-prilocaine (EMLA) cream 1 gram qid prn 01/07/21   Marcial Pacas, MD  LYRICA 50 MG capsule Take 50-150 mg by mouth See admin instructions. Take 50 mg in the morning 50 mg at lunch and up to 150mg  at bedtime 06/01/17   [provider]  metolazone (ZAROXOLYN) 2.5 MG tablet Take 1 tablet (2.5 mg total) by mouth 3 (three) times a week. Jory Sims, Fri 08/16/19   Nahser, Wonda Cheng, MD  metoprolol succinate (TOPROL-XL) 50 MG 24 hr tablet TAKE ONE TABLET BY  MOUTH TWICE DAILY take with or immediately following a meal 09/26/20   Nahser, Wonda Cheng, MD  nystatin (MYCOSTATIN/NYSTOP) powder Apply 1 application topically as needed. 01/13/19   [provider]  oxyCODONE-acetaminophen (PERCOCET/ROXICET) 5-325 MG tablet Take 1 tablet by mouth every 6 (six) hours as needed for severe pain. 03/24/21   Lajean Saver, MD  spironolactone (ALDACTONE) 25 MG tablet Take 1 tablet (25 mg total) by mouth daily. 07/17/20   Nahser, Wonda Cheng, MD  traMADol (ULTRAM) 50 MG tablet Take 50 mg by mouth 3 (three) times daily as needed. 06/24/19   [provider]  triamcinolone ointment (KENALOG) 0.1 % Apply 1 application topically as needed.  04/13/19   [provider]  ULORIC 80 MG TABS Take 1 tablet by mouth daily. 04/29/17   [provider]  Vitamin D, Ergocalciferol, (DRISDOL) 50000 UNITS CAPS Take 50,000 Units by mouth every 7 (seven) days. Takes on Thursday    [provider]  XARELTO 15 MG TABS tablet Take 15 mg by mouth daily with supper.  06/27/16   [provider]      Allergies    Albuterol, Epinephrine, Penicillins, Tizanidine, Torsemide, Bee venom, Ivp dye [iodinated contrast media], and Keflex [cephalexin]    Review of Systems   Review of Systems  Musculoskeletal:  Positive for arthralgias.  All other systems reviewed and are negative.  Physical Exam Updated Vital Signs BP 112/70 (BP Location: Left Arm)    Pulse 96    Temp 97.7 F (36.5 C)    Resp 19    Ht 5\' 9"  (1.753 m)    Wt 123.8 kg    SpO2 99%    BMI 40.32 kg/m  Physical Exam Vitals and nursing note reviewed.  HENT:     Head: Atraumatic.     Nose: Nose normal.  Eyes:     Conjunctiva/sclera: Conjunctivae normal.  Cardiovascular:     Rate and Rhythm: Normal rate.  Pulmonary:     Effort: Pulmonary effort is normal.  Abdominal:     Palpations: Abdomen is soft.  Musculoskeletal:        General: Swelling, tenderness, deformity and signs of injury present.     Cervical back: Normal range of motion.  Skin:    General: Skin is warm and dry.  Neurological:     Mental Status: She is alert and oriented to person, place, and time.  Psychiatric:        Mood and Affect: Mood normal.        Behavior: Behavior normal.    ED Results / Procedures / Treatments   Labs (all labs ordered are listed, but only abnormal results are displayed) Labs Reviewed - No data to display  EKG None  Radiology DG Knee Left Port  Result Date: 04/09/2021 CLINICAL DATA:  Fall.  Knee pain. EXAM: PORTABLE LEFT KNEE - 1-2 VIEW COMPARISON:  06/22/2006 FINDINGS: Mild medial compartment joint space narrowing. Moderate patellofemoral joint space  narrowing. New horizontal linear lucency within the mid height of the patella on lateral view, a nondisplaced fracture. No joint effusion. High-grade prepatellar and pre proximal tibial soft tissue swelling. IMPRESSION: Acute nondisplaced fracture of the mid height of the patella. High-grade anterior soft tissue swelling. Electronically Signed   By: Yvonne Kendall M.D.   On: 04/09/2021 18:45    Procedures Procedures    Medications Ordered in ED Medications  morphine 2 MG/ML injection 2 mg (2 mg Intravenous Given 04/09/21 1931)  HYDROmorphone (DILAUDID) injection 1 mg (  1 mg Intravenous Given 04/09/21 2037)    ED Course/ Medical Decision Making/ A&P                           Medical Decision Making Amount and/or Complexity of Data Reviewed Radiology: ordered.  Risk Prescription drug management.  Patient discussed with Dr. Ashok Cordia.  Radiology results reviewed.  Patient found to have a closed patellar fracture on xray. Patient is hemodynamically stable. Shows no evidence of neurovascular injury or compartment syndrome. Patient was placed in knee immobilizer.  Pain adequately managed in ED.  Patient to follow-up with her orthopedist (Bean).  Care instructions provided and return precautions discussed.  Patient appears safe for discharge at this time. Will return home via convalescent ambulance        Final Clinical Impression(s) / ED Diagnoses Final diagnoses:  Closed nondisplaced fracture of left patella, unspecified fracture morphology, initial encounter    Rx / DC Orders ED Discharge Orders          Ordered    HYDROcodone-acetaminophen (NORCO/VICODIN) 5-325 MG tablet  Every 6 hours PRN        04/09/21 2155              Etta Quill, NP 04/09/21 2305    Lajean Saver, MD 04/10/21 1528

## 2021-04-09 NOTE — ED Notes (Signed)
Pre package of pain medication given to pts husband. Husband verbalized understanding of administration instructions for pain meds.

## 2021-04-09 NOTE — ED Triage Notes (Signed)
Pt BIB via EMS after a fall. Pt was standing from the bed and left trying to get to her wheelchair. Pt c/o left knee pain and there looks to be some deformity. Pt received 183mcg of fentanyl from EMS

## 2021-04-11 MED FILL — Hydrocodone-Acetaminophen Tab 5-325 MG: ORAL | Qty: 6 | Status: AC

## 2021-04-14 NOTE — Telephone Encounter (Signed)
I spoke to [place]. She will move forward with the orders as outlined below.

## 2021-04-14 NOTE — Telephone Encounter (Signed)
[facility]) request verbal orders for OT plan of care approval. Frequency: 3 wk for 2 wks, 2x for 2 wks, 1x wk for 2 wks with re-certification and PT add on evaluation and skills nursing PT add on evaluation.  Contact  info: [phone]

## 2021-04-15 ENCOUNTER — Telehealth: Payer: Self-pay | Admitting: Neurology

## 2021-04-15 NOTE — Telephone Encounter (Signed)
Kristine [name], OT @ [facility] has called to report that last Thursday pt fell out of bed and broke her left knee.  Please call Kristine [name], Other vm is secure and she is asking for orders for a Hospital bed.  She is asking if this can be done today.

## 2021-04-15 NOTE — Telephone Encounter (Signed)
I spoke with Dr. Krista Blue. She would prefer the hospital bed order come from PCP.  I called Melissa, OT and advised her of this. She will contact Dr. Danna Hefty office.

## 2021-05-13 ENCOUNTER — Telehealth: Payer: Self-pay | Admitting: Neurology

## 2021-05-13 NOTE — Telephone Encounter (Signed)
Return call to [facility] at [facility]. She will move forward with the needed orders for the patient.

## 2021-05-13 NOTE — Telephone Encounter (Signed)
Kiki from [facility] called needing VO for Pt wound care. 2x4 weeks and 1x4weeks Please advise.

## 2021-05-14 ENCOUNTER — Encounter: Payer: Self-pay | Admitting: Plastic Surgery

## 2021-05-14 ENCOUNTER — Other Ambulatory Visit: Payer: Self-pay

## 2021-05-14 ENCOUNTER — Telehealth: Payer: Self-pay | Admitting: Cardiovascular Disease

## 2021-05-14 ENCOUNTER — Ambulatory Visit: Payer: Medicare HMO | Admitting: Plastic Surgery

## 2021-05-14 DIAGNOSIS — N184 Chronic kidney disease, stage 4 (severe): Secondary | ICD-10-CM

## 2021-05-14 DIAGNOSIS — E1142 Type 2 diabetes mellitus with diabetic polyneuropathy: Secondary | ICD-10-CM | POA: Diagnosis not present

## 2021-05-14 DIAGNOSIS — I5033 Acute on chronic diastolic (congestive) heart failure: Secondary | ICD-10-CM

## 2021-05-14 DIAGNOSIS — S8002XA Contusion of left knee, initial encounter: Secondary | ICD-10-CM

## 2021-05-14 DIAGNOSIS — G4733 Obstructive sleep apnea (adult) (pediatric): Secondary | ICD-10-CM | POA: Diagnosis not present

## 2021-05-14 DIAGNOSIS — Z794 Long term (current) use of insulin: Secondary | ICD-10-CM

## 2021-05-14 DIAGNOSIS — I4819 Other persistent atrial fibrillation: Secondary | ICD-10-CM

## 2021-05-14 NOTE — Telephone Encounter (Signed)
Pt c/o medication issue:  1. Name of Medication: XARELTO 15 MG TABS tablet  2. How are you currently taking this medication (dosage and times per day)? 1 tablet daily  3. Are you having a reaction (difficulty breathing--STAT)? no  4. What is your medication issue? Dr. Marla Roe calling to request the patient switch from xarelto to lovanox to drain a hematoma. Phone: [phone]

## 2021-05-14 NOTE — Telephone Encounter (Signed)
Anticoagulation Clinic does not make the determination to switch from Xarelto to Lovenox. Will defer to pt's MD and pharmacist to inquire if pt needs a Lovenox bridge or not, etc.

## 2021-05-14 NOTE — H&P (View-Only) (Signed)
Patient ID: Kristine [name], female    DOB: 1946-03-02, 76 y.o.   MRN: [id number]   Chief Complaint  Patient presents with   Consult        Skin Problem    The patient is a 76 year old female here for evaluation of her knee.  She fell last month and sustained a large hematoma.  She is taking Xarelto for atrial fibrillation.  Her past medical history is positive for asthma, atrial fibs, congestive heart failure, kidney disease, depression, thyroid disease, diabetes, hypertension and morbid obesity.  Her past surgical history is listed below.  The skin is very tight and discolored.  She is unable to bend her knee due to the tightness. Pulse is palpable.  She has also been seen in the ED twice for toe wounds.  She has them wrapped.  She has been using xeroform and an ace wrap on her knee.  It does not seem to be getting better according to the patient.    Review of Systems  Constitutional:  Positive for activity change. Negative for appetite change.  Eyes: Negative.   Respiratory: Negative.  Negative for chest tightness and shortness of breath.   Cardiovascular:  Positive for leg swelling.  Gastrointestinal: Negative.   Endocrine: Negative.   Genitourinary: Negative.   Musculoskeletal: Negative.   Skin:  Positive for color change.  Neurological: Negative.   Hematological: Negative.   Psychiatric/Behavioral: Negative.     Past Medical History:  Diagnosis Date   Allergy    Anxiety    Arthritis    Asthma    Atrial fibrillation (HCC)    Cataract    CHF (congestive heart failure) (HCC)    Chronic bronchitis (HCC)    Chronic kidney disease    Clotting disorder (HCC)    Depression    GERD (gastroesophageal reflux disease)    High cholesterol    History of hiatal hernia    HTN (hypertension)    Mild aortic sclerosis    Morbid obesity (HCC)    Neuromuscular disorder ([place])    Neuropathic pain    Osteoporosis    Persistent atrial fibrillation ([place])    Scoliosis     Spinal stenosis    Thyroid disease    Type II diabetes mellitus ([place])    Vitamin D deficiency     Past Surgical History:  Procedure Laterality Date   BLADDER SUSPENSION  1980s   CARDIAC CATHETERIZATION  11/16/09   SMOOTH AND NORMAL   CARDIOVERSION N/A 01/07/2015   Procedure: CARDIOVERSION;  Surgeon: Skeet Latch, MD;  Location: [place];  Service: Cardiovascular;  Laterality: N/A;   CARDIOVERSION N/A 03/11/2015   Procedure: CARDIOVERSION;  Surgeon: Skeet Latch, MD;  Location: [place];  Service: Cardiovascular;  Laterality: N/A;   CARPAL TUNNEL RELEASE Left 1970s   CATARACT EXTRACTION W/ INTRAOCULAR LENS  IMPLANT, BILATERAL Bilateral ~ 2010   COLONOSCOPY  08/14/08   FINGER FRACTURE SURGERY Right 1970s   "ring finger"   [place]      Current Outpatient Medications:    allopurinol (ZYLOPRIM) 300 MG tablet, Take 300 mg by mouth daily., Disp: , Rfl:    ALPRAZolam (XANAX) 0.25 MG tablet, Take 0.125-0.25 mg by mouth 2 (two) times daily as needed for anxiety. , Disp: , Rfl: 0   BD PEN NEEDLE NANO U/F 32G X 4 MM MISC, Inject 1  each as directed See admin instructions. Use pen needles with insulin pens daily, Disp: , Rfl: 11   ciclopirox (LOPROX) 0.77 % cream, Apply 1 application topically 2 (two) times daily. For 4 weeks, Disp: , Rfl:    colchicine 0.6 MG tablet, Take 0.3 mg by mouth daily., Disp: , Rfl:    diclofenac Sodium (VOLTAREN) 1 % GEL, 4 gram qid pn, Disp: 150 g, Rfl: 11   doxycycline (VIBRAMYCIN) 100 MG capsule, Take 1 capsule (100 mg total) by mouth 2 (two) times daily. One po bid x 7 days, Disp: 14 capsule, Rfl: 0   DULoxetine (CYMBALTA) 30 MG capsule, Take 30 mg by mouth daily., Disp: , Rfl:    estradiol (ESTRACE) 0.1 MG/GM vaginal cream, Place 1 Applicatorful vaginally as needed (Estrace). , Disp: , Rfl:    fluconazole (DIFLUCAN) 100 MG tablet, take 2 tablets the  first day then 1 tablet each day for the next 4 days, Disp: , Rfl:    furosemide (LASIX) 80 MG tablet, Take 1.5 tablets (120 mg total) by mouth 2 (two) times daily., Disp: 90 tablet, Rfl: 11   HYDROcodone-acetaminophen (NORCO/VICODIN) 5-325 MG tablet, Take 1 tablet by mouth every 6 (six) hours as needed for severe pain., Disp: 6 tablet, Rfl: 0   insulin aspart (NOVOLOG FLEXPEN) 100 UNIT/ML FlexPen, See admin instructions. Less than 150 units 150=40 units151-199=45 units 200-249=50 units 250-299=55 units 300-349=60 units, Disp: , Rfl:    Insulin Degludec (TRESIBA FLEXTOUCH) 200 UNIT/ML SOPN, Inject 264 Units into the skin as directed. 100 units in the morning, 64 units at lunch, 100 units at night, Disp: , Rfl:    KLOR-CON M20 20 MEQ tablet, Take 60 mEq by mouth 2 (two) times daily. , Disp: , Rfl:    L-Methylfolate-Algae-B12-B6 (METANX) 3-90.314-2-35 MG CAPS, Take 1 tablet by mouth 2 (two) times daily., Disp: 60 capsule, Rfl: 11   levothyroxine (SYNTHROID) 75 MCG tablet, Take 75 mcg by mouth every morning., Disp: , Rfl:    lidocaine-prilocaine (EMLA) cream, 1 gram qid prn, Disp: 30 g, Rfl: 11   LYRICA 50 MG capsule, Take 50-150 mg by mouth See admin instructions. Take 50 mg in the morning 50 mg at lunch and up to 150mg  at bedtime, Disp: , Rfl: 3   metolazone (ZAROXOLYN) 2.5 MG tablet, Take 1 tablet (2.5 mg total) by mouth 3 (three) times a week. Mon, Wed, Fri, Disp: 15 tablet, Rfl: 10   metoprolol succinate (TOPROL-XL) 50 MG 24 hr tablet, TAKE ONE TABLET BY MOUTH TWICE DAILY take with or immediately following a meal (Patient taking differently: Take 50 mg by mouth 2 (two) times daily.), Disp: 30 tablet, Rfl: 6   nystatin (MYCOSTATIN/NYSTOP) powder, Apply 1 application topically as needed., Disp: , Rfl:    oxyCODONE-acetaminophen (PERCOCET/ROXICET) 5-325 MG tablet, Take 1 tablet by mouth every 6 (six) hours as needed for severe pain., Disp: 15 tablet, Rfl: 0   spironolactone (ALDACTONE) 25 MG tablet,  Take 1 tablet (25 mg total) by mouth daily., Disp: 90 tablet, Rfl: 3   traMADol (ULTRAM) 50 MG tablet, Take 50 mg by mouth 3 (three) times daily as needed., Disp: , Rfl:    triamcinolone ointment (KENALOG) 0.1 %, Apply 1 application topically as needed., Disp: , Rfl:    Vitamin D, Ergocalciferol, (DRISDOL) 50000 UNITS CAPS, Take 50,000 Units by mouth every 7 (seven) days. Takes on Thursday, Disp: , Rfl:    XARELTO 15 MG TABS tablet, Take 15 mg by mouth daily., Disp: , Rfl:  Objective:   Vitals:   05/14/21 1257  BP: (!) 111/40  Pulse: 84  SpO2: 97%    Physical Exam Vitals reviewed.  Constitutional:      Appearance: Normal appearance.  HENT:     Head: Normocephalic.  Cardiovascular:     Rate and Rhythm: Normal rate.     Pulses: Normal pulses.  Pulmonary:     Effort: Pulmonary effort is normal.  Abdominal:     General: There is no distension.     Palpations: Abdomen is soft.  Musculoskeletal:       Legs:  Skin:    Coloration: Skin is not jaundiced.     Findings: Bruising and lesion present.  Neurological:     Mental Status: She is alert and oriented to person, place, and time.  Psychiatric:        Mood and Affect: Mood normal.        Behavior: Behavior normal.        Thought Content: Thought content normal.        Judgment: Judgment normal.    Assessment & Plan:  Morbid obesity ([place])  CKD (chronic kidney disease) stage 4, GFR 15-29 ml/min (HCC)  Type 2 diabetes mellitus with diabetic polyneuropathy, with long-term current use of insulin (HCC)  Obstructive sleep apnea  Persistent atrial fibrillation (HCC)  Acute on chronic diastolic (congestive) heart failure (HCC)  Hematoma of left knee region  I would like to get her to the OR in the next week.  Either myself or Dr. Erin Hearing will take her and do evacuation of the hematoma of left knee.  Pictures were obtained of the patient and placed in the chart with the patient's or guardian's permission.  Call /  message placed with Dr. Grayland Jack for guidance on xarelto to lovenox. [place], DO

## 2021-05-14 NOTE — Telephone Encounter (Signed)
I spoke to Kristine [name] and she will move forward with the orders outlined below.

## 2021-05-14 NOTE — Telephone Encounter (Signed)
[facility] (Kristine [name]) requesting Verbal Orders for PT. Frequency: 2x wk for 6 wks,. 1x for 2 wks.  Contact info: [phone]

## 2021-05-14 NOTE — Telephone Encounter (Signed)
We need specific clearance details. Usually would just stop Xarelto for 1-2 days before procedure then resume after. Bridging with Lovenox is rarely needed for pts interrupting their DOAC for a short period of time. Her CHADS2VASc score is 50 (age, gender, CHF, HTN, DM) but she does have CKD with CrCl of 23 using adjusted body weight due to obesity.

## 2021-05-14 NOTE — Telephone Encounter (Signed)
I left detailed message for Dr. Elenor Quinones office that they need to fax over a clearance request so the cardiologist and pharm-d may be able to make an informed decision for pre op clearance., Left message to fax clearance request to [phone] attn PRE OP TEAM. We cannot move forward until we have complete information.  Left message that we still need to know in full: PROCEDURE:  ANESTHESIA: IF ANY BEING USED DATE OF PROCEDURE CONFIRM FAX#: [phone]?

## 2021-05-14 NOTE — Progress Notes (Addendum)
Patient ID: Kristine [name], female    DOB: 28-Oct-1945, 76 y.o.   MRN: [id number]   Chief Complaint  Patient presents with   Consult        Skin Problem    The patient is a 76 year old female here for evaluation of her knee.  She fell last month and sustained a large hematoma.  She is taking Xarelto for atrial fibrillation.  Her past medical history is positive for asthma, atrial fibs, congestive heart failure, kidney disease, depression, thyroid disease, diabetes, hypertension and morbid obesity.  Her past surgical history is listed below.  The skin is very tight and discolored.  She is unable to bend her knee due to the tightness. Pulse is palpable.  She has also been seen in the ED twice for toe wounds.  She has them wrapped.  She has been using xeroform and an ace wrap on her knee.  It does not seem to be getting better according to the patient.    Review of Systems  Constitutional:  Positive for activity change. Negative for appetite change.  Eyes: Negative.   Respiratory: Negative.  Negative for chest tightness and shortness of breath.   Cardiovascular:  Positive for leg swelling.  Gastrointestinal: Negative.   Endocrine: Negative.   Genitourinary: Negative.   Musculoskeletal: Negative.   Skin:  Positive for color change.  Neurological: Negative.   Hematological: Negative.   Psychiatric/Behavioral: Negative.     Past Medical History:  Diagnosis Date   Allergy    Anxiety    Arthritis    Asthma    Atrial fibrillation (HCC)    Cataract    CHF (congestive heart failure) (HCC)    Chronic bronchitis (HCC)    Chronic kidney disease    Clotting disorder (HCC)    Depression    GERD (gastroesophageal reflux disease)    High cholesterol    History of hiatal hernia    HTN (hypertension)    Mild aortic sclerosis    Morbid obesity (HCC)    Neuromuscular disorder ([place])    Neuropathic pain    Osteoporosis    Persistent atrial fibrillation ([place])    Scoliosis     Spinal stenosis    Thyroid disease    Type II diabetes mellitus ([place])    Vitamin D deficiency     Past Surgical History:  Procedure Laterality Date   BLADDER SUSPENSION  1980s   CARDIAC CATHETERIZATION  11/16/09   SMOOTH AND NORMAL   CARDIOVERSION N/A 01/07/2015   Procedure: CARDIOVERSION;  Surgeon: Skeet Latch, MD;  Location: [facility];  Service: Cardiovascular;  Laterality: N/A;   CARDIOVERSION N/A 03/11/2015   Procedure: CARDIOVERSION;  Surgeon: Skeet Latch, MD;  Location: [facility];  Service: Cardiovascular;  Laterality: N/A;   CARPAL TUNNEL RELEASE Left 1970s   CATARACT EXTRACTION W/ INTRAOCULAR LENS  IMPLANT, BILATERAL Bilateral ~ 2010   COLONOSCOPY  08/14/08   FINGER FRACTURE SURGERY Right 1970s   "ring finger"   [facility]      Current Outpatient Medications:    allopurinol (ZYLOPRIM) 300 MG tablet, Take 300 mg by mouth daily., Disp: , Rfl:    ALPRAZolam (XANAX) 0.25 MG tablet, Take 0.125-0.25 mg by mouth 2 (two) times daily as needed for anxiety. , Disp: , Rfl: 0   BD PEN NEEDLE NANO U/F 32G X 4 MM MISC, Inject 1  each as directed See admin instructions. Use pen needles with insulin pens daily, Disp: , Rfl: 11   ciclopirox (LOPROX) 0.77 % cream, Apply 1 application topically 2 (two) times daily. For 4 weeks, Disp: , Rfl:    colchicine 0.6 MG tablet, Take 0.3 mg by mouth daily., Disp: , Rfl:    diclofenac Sodium (VOLTAREN) 1 % GEL, 4 gram qid pn, Disp: 150 g, Rfl: 11   doxycycline (VIBRAMYCIN) 100 MG capsule, Take 1 capsule (100 mg total) by mouth 2 (two) times daily. One po bid x 7 days, Disp: 14 capsule, Rfl: 0   DULoxetine (CYMBALTA) 30 MG capsule, Take 30 mg by mouth daily., Disp: , Rfl:    estradiol (ESTRACE) 0.1 MG/GM vaginal cream, Place 1 Applicatorful vaginally as needed (Estrace). , Disp: , Rfl:    fluconazole (DIFLUCAN) 100 MG tablet, take 2 tablets the  first day then 1 tablet each day for the next 4 days, Disp: , Rfl:    furosemide (LASIX) 80 MG tablet, Take 1.5 tablets (120 mg total) by mouth 2 (two) times daily., Disp: 90 tablet, Rfl: 11   HYDROcodone-acetaminophen (NORCO/VICODIN) 5-325 MG tablet, Take 1 tablet by mouth every 6 (six) hours as needed for severe pain., Disp: 6 tablet, Rfl: 0   insulin aspart (NOVOLOG FLEXPEN) 100 UNIT/ML FlexPen, See admin instructions. Less than 150 units 150=40 units151-199=45 units 200-249=50 units 250-299=55 units 300-349=60 units, Disp: , Rfl:    Insulin Degludec (TRESIBA FLEXTOUCH) 200 UNIT/ML SOPN, Inject 264 Units into the skin as directed. 100 units in the morning, 64 units at lunch, 100 units at night, Disp: , Rfl:    KLOR-CON M20 20 MEQ tablet, Take 60 mEq by mouth 2 (two) times daily. , Disp: , Rfl:    L-Methylfolate-Algae-B12-B6 (METANX) 3-90.314-2-35 MG CAPS, Take 1 tablet by mouth 2 (two) times daily., Disp: 60 capsule, Rfl: 11   levothyroxine (SYNTHROID) 75 MCG tablet, Take 75 mcg by mouth every morning., Disp: , Rfl:    lidocaine-prilocaine (EMLA) cream, 1 gram qid prn, Disp: 30 g, Rfl: 11   LYRICA 50 MG capsule, Take 50-150 mg by mouth See admin instructions. Take 50 mg in the morning 50 mg at lunch and up to 150mg  at bedtime, Disp: , Rfl: 3   metolazone (ZAROXOLYN) 2.5 MG tablet, Take 1 tablet (2.5 mg total) by mouth 3 (three) times a week. Mon, Wed, Fri, Disp: 15 tablet, Rfl: 10   metoprolol succinate (TOPROL-XL) 50 MG 24 hr tablet, TAKE ONE TABLET BY MOUTH TWICE DAILY take with or immediately following a meal (Patient taking differently: Take 50 mg by mouth 2 (two) times daily.), Disp: 30 tablet, Rfl: 6   nystatin (MYCOSTATIN/NYSTOP) powder, Apply 1 application topically as needed., Disp: , Rfl:    oxyCODONE-acetaminophen (PERCOCET/ROXICET) 5-325 MG tablet, Take 1 tablet by mouth every 6 (six) hours as needed for severe pain., Disp: 15 tablet, Rfl: 0   spironolactone (ALDACTONE) 25 MG tablet,  Take 1 tablet (25 mg total) by mouth daily., Disp: 90 tablet, Rfl: 3   traMADol (ULTRAM) 50 MG tablet, Take 50 mg by mouth 3 (three) times daily as needed., Disp: , Rfl:    triamcinolone ointment (KENALOG) 0.1 %, Apply 1 application topically as needed., Disp: , Rfl:    Vitamin D, Ergocalciferol, (DRISDOL) 50000 UNITS CAPS, Take 50,000 Units by mouth every 7 (seven) days. Takes on Thursday, Disp: , Rfl:    XARELTO 15 MG TABS tablet, Take 15 mg by mouth daily., Disp: , Rfl:  Objective:   Vitals:   05/14/21 1257  BP: (!) 111/40  Pulse: 84  SpO2: 97%    Physical Exam Vitals reviewed.  Constitutional:      Appearance: Normal appearance.  HENT:     Head: Normocephalic.  Cardiovascular:     Rate and Rhythm: Normal rate.     Pulses: Normal pulses.  Pulmonary:     Effort: Pulmonary effort is normal.  Abdominal:     General: There is no distension.     Palpations: Abdomen is soft.  Musculoskeletal:       Legs:  Skin:    Coloration: Skin is not jaundiced.     Findings: Bruising and lesion present.  Neurological:     Mental Status: She is alert and oriented to person, place, and time.  Psychiatric:        Mood and Affect: Mood normal.        Behavior: Behavior normal.        Thought Content: Thought content normal.        Judgment: Judgment normal.    Assessment & Plan:  Morbid obesity ([place])  CKD (chronic kidney disease) stage 4, GFR 15-29 ml/min (HCC)  Type 2 diabetes mellitus with diabetic polyneuropathy, with long-term current use of insulin (HCC)  Obstructive sleep apnea  Persistent atrial fibrillation (HCC)  Acute on chronic diastolic (congestive) heart failure (HCC)  Hematoma of left knee region  I would like to get her to the OR in the next week.  Either myself or Dr. Erin Hearing will take her and do evacuation of the hematoma of left knee.  Pictures were obtained of the patient and placed in the chart with the patient's or guardian's permission.  Call /  message placed with Dr. Grayland Jack for guidance on xarelto to lovenox. [employer], DO

## 2021-05-15 NOTE — Telephone Encounter (Signed)
Hi Matt, Can you please help me get a clearance request faxed to our office [phone] attn: pre op team. I left a message yesterday for your office that I need a complete clearance request with all needed information, but I have yet to receive a clearance. I appreciate any help you can give in this matter.

## 2021-05-16 ENCOUNTER — Ambulatory Visit: Payer: Medicare HMO | Admitting: Pulmonary Disease

## 2021-05-16 ENCOUNTER — Encounter: Payer: Self-pay | Admitting: Pulmonary Disease

## 2021-05-16 ENCOUNTER — Other Ambulatory Visit: Payer: Self-pay

## 2021-05-16 VITALS — BP 116/64 | HR 91 | Temp 97.5°F | Ht 69.0 in | Wt 259.0 lb

## 2021-05-16 DIAGNOSIS — R0600 Dyspnea, unspecified: Secondary | ICD-10-CM | POA: Diagnosis not present

## 2021-05-16 NOTE — Progress Notes (Signed)
Kristine [name]    [id number]    [birth year]  Primary Care Physician:Avva, Steva Ready, MD  Referring Physician: Prince Solian, [address] [place],  [place] [address]  Chief complaint:   Follow-up for dyspnea, diastolic heart failure  HPI: [name] is a 76 year old with past medical history of atrial fibrillation, diastolic heart failure, diabetes mellitus, Charcot's foot She had been previously evaluated by Dr. Annamaria Boots in 2012 for dyspnea on exertion. This was thought to be secondary to heart failure from atrial fibrillation, diastolic heart failure. She has a history of recurrent pneumonias with remote hemoptysis in the setting of anticoagulation.   Referred back to pulmonary clinic in 2017 for evaluation of amiodarone toxicity She has hard to control atrial fibrillation. She had failed flecainide, tikosyn due to prolonged QT and has been on amiodarone since March of 2017.  However CT with no definite evidence of amiodarone toxicity.  She has been off amiodarone from about 2019. She had 2 admissions in 2020 for acute on chronic diastolic heart failure, moderate pulmonary hypertension, chronic kidney disease.   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 She does not have any smoking history or relevant exposures.  Interim History: Here for follow-up after gap of 2 years.  States that breathing is stable with no issues Her mobility is limited due to Charcot's foot secondary to diabetes.  She also fell and has a knee hematoma on the left and is scheduled for surgery with evacuation on 05/22/2018  Treated for left lower lobe pneumonia in November 2022.  Follow-up CT scan did not show significant abnormal  Outpatient Encounter Medications as of 05/16/2021  Medication Sig   allopurinol (ZYLOPRIM) 300 MG tablet Take 300 mg by mouth daily.   ALPRAZolam (XANAX) 0.25 MG tablet Take 0.125-0.25 mg by mouth 2 (two) times daily as needed for anxiety.    BD PEN NEEDLE  NANO U/F 32G X 4 MM MISC Inject 1 each as directed See admin instructions. Use pen needles with insulin pens daily   ciclopirox (LOPROX) 0.77 % cream Apply 1 application topically 2 (two) times daily. For 4 weeks   colchicine 0.6 MG tablet Take 0.3 mg by mouth daily.   diclofenac Sodium (VOLTAREN) 1 % GEL 4 gram qid pn   DULoxetine (CYMBALTA) 30 MG capsule Take 30 mg by mouth daily.   estradiol (ESTRACE) 0.1 MG/GM vaginal cream Place 1 Applicatorful vaginally as needed (Estrace).    fluconazole (DIFLUCAN) 100 MG tablet take 2 tablets the first day then 1 tablet each day for the next 4 days   furosemide (LASIX) 80 MG tablet Take 1.5 tablets (120 mg total) by mouth 2 (two) times daily.   HYDROcodone-acetaminophen (NORCO/VICODIN) 5-325 MG tablet Take 1 tablet by mouth every 6 (six) hours as needed for severe pain.   insulin aspart (NOVOLOG FLEXPEN) 100 UNIT/ML FlexPen See admin instructions. Less than 150 units 150=40 units151-199=45 units 200-249=50 units 250-299=55 units 300-349=60 units   Insulin Degludec (TRESIBA FLEXTOUCH) 200 UNIT/ML SOPN Inject 264 Units into the skin as directed. 100 units in the morning, 64 units at lunch, 100 units at night   KLOR-CON M20 20 MEQ tablet Take 60 mEq by mouth 2 (two) times daily.    L-Methylfolate-Algae-B12-B6 (METANX) 3-90.314-2-35 MG CAPS Take 1 tablet by mouth 2 (two) times daily.   levothyroxine (SYNTHROID) 75 MCG tablet Take 75 mcg by mouth every morning.   lidocaine-prilocaine (EMLA) cream 1 gram qid prn  LYRICA 50 MG capsule Take 50-150 mg by mouth See admin instructions. Take 50 mg in the morning 50 mg at lunch and up to 133m at bedtime   metolazone (ZAROXOLYN) 2.5 MG tablet Take 1 tablet (2.5 mg total) by mouth 3 (three) times a week. Mon, Wed, Fri   metoprolol succinate (TOPROL-XL) 50 MG 24 hr tablet TAKE ONE TABLET BY MOUTH TWICE DAILY take with or immediately following a meal (Patient taking differently: Take 50 mg by mouth 2 (two) times daily.)    nystatin (MYCOSTATIN/NYSTOP) powder Apply 1 application topically as needed.   oxyCODONE-acetaminophen (PERCOCET/ROXICET) 5-325 MG tablet Take 1 tablet by mouth every 6 (six) hours as needed for severe pain.   spironolactone (ALDACTONE) 25 MG tablet Take 1 tablet (25 mg total) by mouth daily.   traMADol (ULTRAM) 50 MG tablet Take 50 mg by mouth 3 (three) times daily as needed.   triamcinolone ointment (KENALOG) 0.1 % Apply 1 application topically as needed.   Vitamin D, Ergocalciferol, (DRISDOL) 50000 UNITS CAPS Take 50,000 Units by mouth every 7 (seven) days. Takes on Thursday   XARELTO 15 MG TABS tablet Take 15 mg by mouth daily.   [DISCONTINUED] doxycycline (VIBRAMYCIN) 100 MG capsule Take 1 capsule (100 mg total) by mouth 2 (two) times daily. One po bid x 7 days   No facility-administered encounter medications on file as of 05/16/2021.   Physical Exam: Blood pressure 116/64, pulse 91, temperature (!) 97.5 F (36.4 C), temperature source Oral, height _0  (1.753 m), weight 259 lb (117.5 kg), SpO2 97 %. Gen:      No acute distress HEENT:  EOMI, sclera anicteric Neck:     No masses; no thyromegaly Lungs:    Clear to auscultation bilaterally; normal respiratory effort CV:         Regular rate and rhythm; no murmurs Abd:      + bowel sounds; soft, non-tender; no palpable masses, no distension Ext:    No edema; adequate peripheral perfusion Skin:      Warm and dry; no rash Neuro: alert and oriented x 3 Psych: normal mood and affect   Data Reviewed: Imaging: High res CT 01/23/16 Patchy mild regurgitation, faint groundglass opacities. Nonspecific findings not clearly consistent with amiodarone toxicity.   High-res CT 09/27/2018- No change in mild bibasal scarring/atelectasis.  Mild trapping.  Calcified pulmonary nodules.  I have reviewed the images personally.  CT chest 02/19/2021-small linear densities left lower lobe and lingula.  No ILD I have reviewed the images  personally  PFT 12/05/09- Mild restriction TLC 73%, DLCO 69%, Spirometry WNL w/ resp to BD 6MWT-12/05/09- 100%, 98%, 98% 368 m.  11/19/15 FVC 3.61 (69%), FEV1 2.75 (69%), F/F 76, TLC 82%, DLCO 49% Moderate-severe diffusion defect.  02/22/2019 FVC 2.22 [63%], FEV1 1.81 [68%], F/F 82, DLCO 17.50 [98% Mild restriction with minimal diffusion defect.  Echo 04/04/15 Mild to moderate LVH with LVEF 55-60% and grade 2 diastolic dysfunction with increased filling pressures. Mild left atrial enlargement. Mild MAC with mild mitral regurgitation. Sclerotic aortic valve without stenosis. Mildly dilated RV with normal contraction. Trivial tricuspid regurgitation, unable to accurately assess PASP. Trivial pericardial effusion.  Assessment:  Follow-up for dyspnea on exertion, abnormal PFTs. PFTs did not show any obstruction to suggest either COPD or emphysema. There is an isolated reduction in diffusion capacity that corrects for alveolar volume.  There is a question of amiodarone toxicity but no evidence of this on imaging. CT imaging reviewed with no significant interstitial lung  disease  I suspect her dyspnea is multifactorial secondary to her obesity, deconditioning and chronic heart failure, A. fib issues.  Do not see any obvious pulmonary etiology for dyspnea  Continue management with cardiology Work on weight loss with diet.  Plan/Recommendations: Follow-up in 1 year  Marshell Garfinkel MD [facility]  05/16/2021, 11:14 AM  CC: Prince Solian, MD

## 2021-05-16 NOTE — Telephone Encounter (Signed)
Per Verdie Shire, [facility] with [facility] specialist: I just notified our staff that manages these to send one to you all today.  I will await for a clearance to come over before being able to proceed.

## 2021-05-16 NOTE — Patient Instructions (Signed)
I am glad you are stable with your breathing.  I have reviewed your CT scan from few months ago which does not show any significant lung issues  Follow-up in 1 year

## 2021-05-19 ENCOUNTER — Telehealth: Payer: Self-pay

## 2021-05-19 ENCOUNTER — Encounter: Payer: Self-pay | Admitting: Plastic Surgery

## 2021-05-19 ENCOUNTER — Encounter (HOSPITAL_COMMUNITY): Payer: Self-pay | Admitting: Plastic Surgery

## 2021-05-19 ENCOUNTER — Other Ambulatory Visit: Payer: Self-pay

## 2021-05-19 NOTE — Telephone Encounter (Signed)
Called patient, advise to hold Xarelto 2 days prior to surgery (3/1). No need to bridge with Lovenox. Patient was also directed by NP at [facility].

## 2021-05-19 NOTE — Telephone Encounter (Signed)
° °  Pre-operative Risk Assessment    Patient Name: Kristine [name]  DOB: 09/08/1945 MRN: [id number]      Request for Surgical Clearance    Procedure:  EVACUATION OF  LEFT KNEE HEMATOMA  Date of Surgery:  Clearance 05/21/21  STAT                               Surgeon:  DR. DANIEL LUPPENS Surgeon's Group or Practice Name:  [facility] Phone number:  [phone] Fax number:  [phone]   Type of Clearance Requested:   - Medical  - Pharmacy:  Hold Rivaroxaban (Xarelto)     Type of Anesthesia:  General    Additional requests/questions:    Jiles Prows   05/19/2021, 8:19 AM

## 2021-05-19 NOTE — Pre-Procedure Instructions (Signed)
[facility] [place], [address] [place] [address] Phone: [phone] Fax: [phone]   PCP - Prince Solian, MD Cardiologist - Alfonzo Beers, MD   Chest x-ray - 02/02/21 EKG - 11/18/20 ECHO - 11/18/20 Cardiac Cath - 11/16/09   Fasting Blood Sugar - 122 Checks Blood Sugar 4/day  Blood Thinner Instructions: Xarelto last dose on 05/17/21  ERAS Protcol - N/A  COVID TEST- N/A Ambulatory sx  Anesthesia review: Y  Patient verbally denies any shortness of breath, fever, cough and chest pain during phone call   -------------  SDW INSTRUCTIONS given:  Your procedure is scheduled on 05/21/21.  Report to [facility] Main Entrance "A" at 11 A.M., and check in at the Admitting office.  Call this number if you have problems the morning of surgery:  [phone]   Remember:  Do not eat after midnight the night before your surgery    Take these medicines the morning of surgery with A SIP OF WATER  allopurinol (ZYLOPRIM) Colchicine DULoxetine (CYMBALTA) levothyroxine (SYNTHROID) LYRICA metoprolol succinate (TOPROL-XL) ALPRAZolam (XANAX)-if needed HYDROcodone-acetaminophen (NORCO/VICODIN)- if needed oxyCODONE-acetaminophen (PERCOCET/ROXICET)- if needed traMADol (ULTRAM)- if needed  Tresiba---Take 50 units at bedtime and 50 units morning of surgery  Novolog--Take ONLY if your blood sugar is above 220 the morning of surgery. Please take 1/2 of usual dose.     .** PLEASE check your blood sugar the morning of your surgery when you wake up and every 2 hours until you get to the Short Stay unit.  If your blood sugar is less than 70 mg/dL, you will need to treat for low blood sugar: Do not take insulin. Treat a low blood sugar (less than 70 mg/dL) with  cup of clear juice (cranberry or apple), 4 glucose tablets, OR glucose gel. Recheck blood sugar in 15 minutes after treatment (to make sure it is greater than 70 mg/dL). If your blood sugar  is not greater than 70 mg/dL on recheck, call [phone] for further instructions.  As of today, STOP taking any Aspirin (unless otherwise instructed by your surgeon) Aleve, Naproxen, Ibuprofen, Motrin, Advil, Goody's, BC's, all herbal medications, fish oil, and all vitamins.                   Do not wear jewelry, make up, or nail polish            Do not wear lotions, powders, perfumes/colognes, or deodorant.            Do not shave 48 hours prior to surgery.  Men may shave face and neck.            Do not bring valuables to the hospital.            [facility] is not responsible for any belongings or valuables.  Do NOT Smoke (Tobacco/Vaping) 24 hours prior to your procedure If you use a CPAP at night, you may bring all equipment for your overnight stay.   Contacts, glasses, dentures or bridgework may not be worn into surgery.      For patients admitted to the hospital, discharge time will be determined by your treatment team.   Patients discharged the day of surgery will not be allowed to drive home, and someone needs to stay with them for 24 hours.    Special instructions:   [place]- Preparing For Surgery  Before surgery, you can play an important role. Because skin is not  sterile, your skin needs to be as free of germs as possible. You can reduce the number of germs on your skin by washing with CHG (chlorahexidine gluconate) Soap before surgery.  CHG is an antiseptic cleaner which kills germs and bonds with the skin to continue killing germs even after washing.    Oral Hygiene is also important to reduce your risk of infection.  Remember - BRUSH YOUR TEETH THE MORNING OF SURGERY WITH YOUR REGULAR TOOTHPASTE  Please do not use if you have an allergy to CHG or antibacterial soaps. If your skin becomes reddened/irritated stop using the CHG.  Do not shave (including legs and underarms) for at least 48 hours prior to first CHG shower. It is OK to shave your face.  Please follow  these instructions carefully.   Shower the NIGHT BEFORE SURGERY and the MORNING OF SURGERY with DIAL Soap.   Pat yourself dry with a CLEAN TOWEL.  Wear CLEAN PAJAMAS to bed the night before surgery  Place CLEAN SHEETS on your bed the night of your first shower and DO NOT SLEEP WITH PETS.   Day of Surgery: Please shower morning of surgery  Wear Clean/Comfortable clothing the morning of surgery Do not apply any deodorants/lotions.   Remember to brush your teeth WITH YOUR REGULAR TOOTHPASTE.   Questions were answered. Patient verbalized understanding of instructions.

## 2021-05-19 NOTE — Telephone Encounter (Signed)
° °  Primary Cardiologist: Mertie Moores, MD  Chart reviewed as part of pre-operative protocol coverage. Given past medical history and time since last visit, based on ACC/AHA guidelines, Kristine [name] would be at acceptable risk for the planned procedure without further cardiovascular testing.   Patient with diagnosis of afib on Xarelto for anticoagulation.     Procedure: evacuation of left knee hematoma Date of procedure: 05/21/21   CHA2DS2-VASc Score = 5  This indicates a 7.2% annual risk of stroke. The patient's score is based upon: CHF History: 1 HTN History: 1 Diabetes History: 1 Stroke History: 0 Vascular Disease History: 0 Age Score: 1 Gender Score: 1   CrCl 49mL/min using adjusted body weight Platelet count 160K   Per office protocol, patient can hold Xarelto for 2 days prior to procedure. Lovenox bridge is not required.  Patient was advised that if she develops new symptoms prior to surgery to contact our office to arrange a follow-up appointment.  She verbalized understanding.  I will route this recommendation to the requesting party via Epic fax function and remove from pre-op pool.  Please call with questions.  Jossie Ng. Odester Nilson NP-C    05/19/2021, 10:09 AM [place] [address] Office [phone] Fax [phone]

## 2021-05-19 NOTE — Telephone Encounter (Signed)
Patient with diagnosis of afib on Xarelto for anticoagulation.    Procedure: evacuation of left knee hematoma Date of procedure: 05/21/21  CHA2DS2-VASc Score = 5  This indicates a 7.2% annual risk of stroke. The patient's score is based upon: CHF History: 1 HTN History: 1 Diabetes History: 1 Stroke History: 0 Vascular Disease History: 0 Age Score: 1 Gender Score: 1   CrCl 75mL/min using adjusted body weight Platelet count 160K  Per office protocol, patient can hold Xarelto for 2 days prior to procedure. Lovenox bridge is not required.

## 2021-05-20 NOTE — Anesthesia Preprocedure Evaluation (Addendum)
Anesthesia Evaluation  Patient identified by MRN, date of birth, ID band Patient awake    Reviewed: Allergy & Precautions, NPO status , Patient's Chart, lab work & pertinent test results, reviewed documented beta blocker date and time   Airway Mallampati: II  TM Distance: >3 FB Neck ROM: Full    Dental no notable dental hx.    Pulmonary shortness of breath and with exertion, asthma , sleep apnea and Continuous Positive Airway Pressure Ventilation ,  02/2019 PFTs showed mild restriction with minimal diffusion defect. He suspected her dyspnea was "multifactorial secondary to her obesity, deconditioning and chronic heart failure, A. fib issues.  Do not see any obvious pulmonary etiology for dyspnea"   Pulmonary exam normal breath sounds clear to auscultation       Cardiovascular hypertension, Pt. on medications and Pt. on home beta blockers pulmonary hypertension (mod pHTN)+CHF (LVEF 40-45%, severe RV dysfunction)  Normal cardiovascular exam+ dysrhythmias (xarelto) Atrial Fibrillation + Valvular Problems/Murmurs (mild-mod MR) MR  Rhythm:Regular Rate:Normal  Echo 07/2020: 1. Left ventricular ejection fraction, by estimation, is 45 to 50%. The  left ventricle has mildly decreased function. Left ventricular endocardial  border not optimally defined to evaluate regional wall motion. Left  ventricular diastolic parameters are  indeterminate. There is the interventricular septum is flattened in  systole and diastole, consistent with right ventricular pressure and  volume overload.  2. Right ventricular systolic function is severely reduced. The right  ventricular size is moderately enlarged. There is moderately elevated  pulmonary artery systolic pressure. The estimated right ventricular  systolic pressure is 22.2 mmHg.  3. Right atrial size was mildly dilated.  4. Moderate pericardial effusion. The pericardial effusion is posterior  to  the left ventricle.  5. The mitral valve is abnormal. Mild to moderate mitral valve  regurgitation.  6. Tricuspid valve regurgitation is mild to moderate.  7. The aortic valve is tricuspid. There is moderate calcification of the  aortic valve. Aortic valve regurgitation is not visualized.  8. The inferior vena cava is dilated in size with <50% respiratory  variability, suggesting right atrial pressure of 15 mmHg.    Neuro/Psych PSYCHIATRIC DISORDERS Anxiety Depression  Neuromuscular disease (peripheral neuropathy 2/2 poorly controlled T2DM)    GI/Hepatic Neg liver ROS, hiatal hernia, GERD  Controlled,  Endo/Other  diabetes, Poorly Controlled, Type 2, Insulin DependentHypothyroidism Obesity BMI 38 a1c 10.2 FS 52 in preop, feeling hypoglycemic- given 1 amp of dextrose   Renal/GU CRFRenal disease (CKD 4)  negative genitourinary   Musculoskeletal  (+) Arthritis , Osteoarthritis,  L knee hematoma   Abdominal   Peds  Hematology negative hematology ROS (+)   Anesthesia Other Findings   Reproductive/Obstetrics negative OB ROS                           Anesthesia Physical Anesthesia Plan  ASA: 4  Anesthesia Plan: General   Post-op Pain Management: Tylenol PO (pre-op)*   Induction: Intravenous  PONV Risk Score and Plan: 3 and Ondansetron, Dexamethasone and Treatment may vary due to age or medical condition  Airway Management Planned: LMA  Additional Equipment: None  Intra-op Plan:   Post-operative Plan: Extubation in OR  Informed Consent: I have reviewed the patients History and Physical, chart, labs and discussed the procedure including the risks, benefits and alternatives for the proposed anesthesia with the patient or authorized representative who has indicated his/her understanding and acceptance.     Dental advisory given  Plan  Discussed with: CRNA  Anesthesia Plan Comments: (Severe RV dysfunction)      Anesthesia Quick  Evaluation

## 2021-05-20 NOTE — Progress Notes (Signed)
Anesthesia Chart Review: SAME DAY WORK-UP   Case: [id number] Date/Time: 05/21/21 1315   Procedure: EVACUATION LEFT KNEE HEMATOMA (Left: Knee)   Anesthesia type: General   Pre-op diagnosis: Hematoma of left knee region   Location: [facility] OR ROOM 09 / [place] OR   Surgeons: Lennice Sites, MD       DISCUSSION: Patient is a 76 year old female scheduled for the above procedure.  History includes never smoker, afib (on anticoagulation), chronic diastolic CHF, PAH, aortic sclerosis, HTN, hypercholesterolemia, scoliosis, GERD, hiatal hernia, asthma, chronic bronchitis, OSA, DM2, CKD (stage IV), obesity.   She was referred to the [facility] after 07/2020 echo showed EF 45-50%, RV severely reduced, RVSP 55, D-shaped septum, severe biatrial enlargement, moderate pericardial effusion in setting of known diastolic CHF, afib, obesity, CKD, snoing. Based on echo, Dr. Haroldine Laws suspected she likely had "diastolic dysfunction with restrictive physiology with secondary pulmonary HTN and R-sided HF. OSA/OHS also likely a major contributor to RV dysfunction/PH as well". Sleep study ordered and showed moderate OSA. As of 11/18/20, he felt she had WHO group 2/3 PAH. Diuretics had been increased and strongly suggested compliance with CPAP and weight loss. Volume status okay at that visit. She was starting CPAP for moderate OSA. 4 month follow-up with echo planned.  Cardiology preoperative input outlined by Coletta Memos, NP on 05/19/21. He wrote, "Given past medical history and time since last visit, based on ACC/AHA guidelines, Kristine [name] would be at acceptable risk for the planned procedure without further cardiovascular testing..." She is on Xarelto for afib. CHA2DS2-VASc Score = 5. He added, "Per office protocol, patient can hold Xarelto for 2 days prior to procedure. Lovenox bridge is not required." There is a notation from 05/19/21 that patient was advised to hold Xarelto 2 days prior to surgery.    Last  pulmonology visit with Dr. Vaughan Browner was on 05/16/21 for follow-up for DOE with history of abnormal PFTs (severe diffusion defect in 2017). There was also question of amiodarone toxicity, but no evident on imaging and no significant ILD. 02/2019 PFTs showed mild restriction with minimal diffusion defect. He suspected her dyspnea was "multifactorial secondary to her obesity, deconditioning and chronic heart failure, A. fib issues.  Do not see any obvious pulmonary etiology for dyspnea" Continue management with cardiology. One year follow-up planned. She notified him of surgery plans.   She is a same day work-up so anesthesia team to evaluate on the day of surgery.    VS:  BP Readings from Last 3 Encounters:  05/16/21 116/64  05/14/21 (!) 111/40  04/09/21 112/73   Pulse Readings from Last 3 Encounters:  05/16/21 91  05/14/21 84  04/09/21 81     PROVIDERS: Prince Solian, MD is PCP  Mertie Moores, MD is cardiologist Glori Bickers, MD is HF cardiologist. Last visit 11/18/20.  Marshell Garfinkel, MD is pulmonologist Pearson Grippe, MD is nephrologist Marcial Pacas, MD is neurologist   LABS: Labs as indicated on the day of surgery. Creatinine 2.43-2.91, BUN 50-72, eGFR 16-20 since 10/2020. BC on 05/07/21 [facility]) showed WBC 7.1, hemoglobin 11.2, hematocrit 37.2, platelet count 160.  A1c 7.8% on 04/14/21 ([place]).   OTHER: Sleep Study 08/21/20: FINDINGS: 1. Moderate Obstructive Sleep Apnea with AHI 28.5/hr.  2. No Central Sleep Apnea with pAHIc 1.4/hr. 3. Oxygen desaturations as low as 83%. 4. Moderate snoring was present. O2 sats were < 88% for 15.6 min. 5. Total sleep time was 8 hrs and 22 min. 6. 7.2% of  total sleep time was spent in REM sleep.  7. Short sleep onset latency at 5 min.  8. Prolonged REM sleep onset latency at 284 min.  9. Total awakenings were 31.   CPAP Titration Study 10/20/20: IMPRESSIONS - An optimal PAP pressure could not be selected for this  patient based on the available study data. - Moderate oxygen desaturations were observed during this titration (min O2 = 88.00%). - The patient snored with soft snoring volume during this titration study. - 2-lead EKG demonstrated: PVCs and ventricular couplets - Clinically significant periodic limb movements were not noted during this study. Arousals associated with PLMs were rare. RECOMMENDATIONS - Recommend a trial of Auto-CPAP 6-15 cm H2O. Patient did not tolerate CPAP...  PFTs 02/22/19: "FVC 2.22 [63%], FEV1 1.81 [68%], F/F 82, DLCO 17.50 [98% Mild restriction with minimal diffusion defect."   IMAGES: Xray left knee 04/09/21: IMPRESSION: Acute nondisplaced fracture of the mid height of the patella. High-grade anterior soft tissue swelling.   CT Chest 02/19/21: IMPRESSION: - No active pneumonia presently. Small linear densities in the left lower lobe and lingula, possibly residua of previous pneumonia. - Cardiomegaly. Small pericardial effusion. Coronary artery calcification. Aortic atherosclerotic calcification. Pulmonary arterial calcification, suggesting chronic pulmonary arterial hypertension. - Few scattered calcified granulomas in both lungs, right more than left, with small calcified right hilar nodes.     EKG: 11/18/20: Unusual P axis, possible ectopic atrial rhythm with Premature supraventricular complexes Possible Right ventricular hypertrophy Lateral infarct , age undetermined Marked ST abnormality, possible inferior subendocardial injury Abnormal ECG No significant change since last tracing Confirmed by Shelva Majestic [phone]) on 11/18/2020 6:36:06 PM - Baseline wanderer in limb leads which could effect interpretation. Cardiologist felt EKG was stable when compared to previous tracing. She has know afib.     CV: Echo 08/08/20: IMPRESSIONS   1. Left ventricular ejection fraction, by estimation, is 45 to 50%. The  left ventricle has mildly decreased function. Left  ventricular endocardial  border not optimally defined to evaluate regional wall motion. Left  ventricular diastolic parameters are  indeterminate. There is the interventricular septum is flattened in  systole and diastole, consistent with right ventricular pressure and  volume overload.   2. Right ventricular systolic function is severely reduced. The right  ventricular size is moderately enlarged. There is moderately elevated  pulmonary artery systolic pressure. The estimated right ventricular  systolic pressure is 78.9 mmHg.   3. Right atrial size was mildly dilated.   4. Moderate pericardial effusion. The pericardial effusion is posterior  to the left ventricle.   5. The mitral valve is abnormal. Mild to moderate mitral valve  regurgitation.   6. Tricuspid valve regurgitation is mild to moderate.   7. The aortic valve is tricuspid. There is moderate calcification of the  aortic valve. Aortic valve regurgitation is not visualized.   8. The inferior vena cava is dilated in size with <50% respiratory  variability, suggesting right atrial pressure of 15 mmHg.  - Comparison(s): Echocardiogram done 06/04/18 showed an EF of 50-55%.  - She was referred to [facility] and is now established with Dr. Glori Bickers.  Nuclear stress test 07/25/15: The left ventricular ejection fraction is normal (55-65%). Nuclear stress EF: 59%. There was no ST segment deviation noted during stress. This is a low risk study.   Past Medical History:  Diagnosis Date   Allergy    Anxiety    Arthritis    Asthma    Atrial fibrillation ([place])  Cataract    CHF (congestive heart failure) (HCC)    Chronic bronchitis (HCC)    Chronic kidney disease    Clotting disorder (HCC)    Depression    Dysrhythmia    GERD (gastroesophageal reflux disease)    High cholesterol    History of hiatal hernia    HTN (hypertension)    Mild aortic sclerosis    Morbid obesity (HCC)    Neuromuscular disorder  (HCC)    Neuropathic pain    Osteoporosis    Persistent atrial fibrillation ([place])    Scoliosis    Sleep apnea    Spinal stenosis    Thyroid disease    Type II diabetes mellitus ([place])    Vitamin D deficiency     Past Surgical History:  Procedure Laterality Date   BLADDER SUSPENSION  1980s   CARDIAC CATHETERIZATION  11/16/09   SMOOTH AND NORMAL   CARDIOVERSION N/A 01/07/2015   Procedure: CARDIOVERSION;  Surgeon: Skeet Latch, MD;  Location[facility] ENDOSCOPY;  Service: Cardiovascular;  Laterality: N/A;   CARDIOVERSION N/A 03/11/2015   Procedure: CARDIOVERSION;  Surgeon: Skeet Latch, MD;  Location: [place];  Service: Cardiovascular;  Laterality: N/A;   CARPAL TUNNEL RELEASE Left 1970s   CATARACT EXTRACTION W/ INTRAOCULAR LENS  IMPLANT, BILATERAL Bilateral ~ 2010   COLONOSCOPY  08/14/08   FINGER FRACTURE SURGERY Right 1970s   "ring finger"   [place]: No current facility-administered medications for this encounter.    allopurinol (ZYLOPRIM) 300 MG tablet   busPIRone (BUSPAR) 15 MG tablet   colchicine 0.6 MG tablet   docusate sodium (COLACE) 100 MG capsule   DULoxetine (CYMBALTA) 30 MG capsule   estradiol (ESTRACE) 0.1 MG/GM vaginal cream   furosemide (LASIX) 40 MG tablet   HYDROcodone-acetaminophen (NORCO/VICODIN) 5-325 MG tablet   insulin aspart (NOVOLOG FLEXPEN) 100 UNIT/ML FlexPen   Insulin Degludec (TRESIBA FLEXTOUCH) 200 UNIT/ML SOPN   KLOR-CON M20 20 MEQ tablet   levothyroxine (SYNTHROID) 75 MCG tablet   LYRICA 50 MG capsule   metolazone (ZAROXOLYN) 2.5 MG tablet   metoprolol succinate (TOPROL-XL) 50 MG 24 hr tablet   nystatin (MYCOSTATIN/NYSTOP) powder   polyethylene glycol (MIRALAX / GLYCOLAX) 17 g packet   spironolactone (ALDACTONE) 25 MG tablet   traMADol (ULTRAM) 50 MG tablet   Vitamin D, Ergocalciferol, (DRISDOL) 50000 UNITS CAPS    XARELTO 15 MG TABS tablet   ALPRAZolam (XANAX) 0.25 MG tablet   BD PEN NEEDLE NANO U/F 32G X 4 MM MISC   diclofenac Sodium (VOLTAREN) 1 % GEL   furosemide (LASIX) 80 MG tablet   L-Methylfolate-Algae-B12-B6 (METANX) 3-90.314-2-35 MG CAPS   lidocaine-prilocaine (EMLA) cream   oxyCODONE-acetaminophen (PERCOCET/ROXICET) 5-325 MG tablet    Myra Gianotti, PA-C Surgical Short Stay/Anesthesiology [facility] Phone [phone] [facility] Phone [phone] 05/20/2021 11:08 AM

## 2021-05-21 ENCOUNTER — Ambulatory Visit (HOSPITAL_COMMUNITY)
Admission: RE | Admit: 2021-05-21 | Discharge: 2021-05-21 | Disposition: A | Discharge status: Home / Self Care | Payer: Medicare HMO | Attending: Plastic Surgery | Admitting: Plastic Surgery

## 2021-05-21 ENCOUNTER — Encounter (HOSPITAL_COMMUNITY)
Admission: RE | Disposition: A | Discharge status: Home / Self Care | Payer: Self-pay | Source: Home / Self Care | Attending: Plastic Surgery

## 2021-05-21 ENCOUNTER — Encounter (HOSPITAL_COMMUNITY): Payer: Self-pay | Admitting: Plastic Surgery

## 2021-05-21 ENCOUNTER — Ambulatory Visit (HOSPITAL_COMMUNITY): Payer: Medicare HMO | Admitting: Vascular Surgery

## 2021-05-21 ENCOUNTER — Ambulatory Visit (HOSPITAL_BASED_OUTPATIENT_CLINIC_OR_DEPARTMENT_OTHER): Payer: Medicare HMO | Admitting: Vascular Surgery

## 2021-05-21 ENCOUNTER — Other Ambulatory Visit: Payer: Self-pay

## 2021-05-21 DIAGNOSIS — Z794 Long term (current) use of insulin: Secondary | ICD-10-CM | POA: Insufficient documentation

## 2021-05-21 DIAGNOSIS — E1122 Type 2 diabetes mellitus with diabetic chronic kidney disease: Secondary | ICD-10-CM

## 2021-05-21 DIAGNOSIS — E039 Hypothyroidism, unspecified: Secondary | ICD-10-CM | POA: Insufficient documentation

## 2021-05-21 DIAGNOSIS — F32A Depression, unspecified: Secondary | ICD-10-CM | POA: Insufficient documentation

## 2021-05-21 DIAGNOSIS — F419 Anxiety disorder, unspecified: Secondary | ICD-10-CM | POA: Diagnosis not present

## 2021-05-21 DIAGNOSIS — G4733 Obstructive sleep apnea (adult) (pediatric): Secondary | ICD-10-CM | POA: Insufficient documentation

## 2021-05-21 DIAGNOSIS — J45909 Unspecified asthma, uncomplicated: Secondary | ICD-10-CM | POA: Insufficient documentation

## 2021-05-21 DIAGNOSIS — E1142 Type 2 diabetes mellitus with diabetic polyneuropathy: Secondary | ICD-10-CM | POA: Diagnosis not present

## 2021-05-21 DIAGNOSIS — I4819 Other persistent atrial fibrillation: Secondary | ICD-10-CM | POA: Insufficient documentation

## 2021-05-21 DIAGNOSIS — I272 Pulmonary hypertension, unspecified: Secondary | ICD-10-CM | POA: Insufficient documentation

## 2021-05-21 DIAGNOSIS — W19XXXA Unspecified fall, initial encounter: Secondary | ICD-10-CM | POA: Diagnosis not present

## 2021-05-21 DIAGNOSIS — I130 Hypertensive heart and chronic kidney disease with heart failure and stage 1 through stage 4 chronic kidney disease, or unspecified chronic kidney disease: Secondary | ICD-10-CM

## 2021-05-21 DIAGNOSIS — N184 Chronic kidney disease, stage 4 (severe): Secondary | ICD-10-CM | POA: Diagnosis not present

## 2021-05-21 DIAGNOSIS — S8002XA Contusion of left knee, initial encounter: Secondary | ICD-10-CM | POA: Insufficient documentation

## 2021-05-21 DIAGNOSIS — I509 Heart failure, unspecified: Secondary | ICD-10-CM | POA: Diagnosis not present

## 2021-05-21 DIAGNOSIS — M7981 Nontraumatic hematoma of soft tissue: Secondary | ICD-10-CM | POA: Diagnosis not present

## 2021-05-21 DIAGNOSIS — I5033 Acute on chronic diastolic (congestive) heart failure: Secondary | ICD-10-CM | POA: Insufficient documentation

## 2021-05-21 DIAGNOSIS — Z7901 Long term (current) use of anticoagulants: Secondary | ICD-10-CM | POA: Insufficient documentation

## 2021-05-21 DIAGNOSIS — E1165 Type 2 diabetes mellitus with hyperglycemia: Secondary | ICD-10-CM | POA: Insufficient documentation

## 2021-05-21 DIAGNOSIS — Z6838 Body mass index (BMI) 38.0-38.9, adult: Secondary | ICD-10-CM | POA: Insufficient documentation

## 2021-05-21 HISTORY — PX: HEMATOMA EVACUATION: SHX5118

## 2021-05-21 HISTORY — DX: Cardiac arrhythmia, unspecified: I49.9

## 2021-05-21 HISTORY — DX: Sleep apnea, unspecified: G47.30

## 2021-05-21 LAB — CBC
HCT: 38.5 % (ref 36.0–46.0)
Hemoglobin: 11.4 g/dL — ABNORMAL LOW (ref 12.0–15.0)
MCH: 26 pg (ref 26.0–34.0)
MCHC: 29.6 g/dL — ABNORMAL LOW (ref 30.0–36.0)
MCV: 87.7 fL (ref 80.0–100.0)
Platelets: 156 10*3/uL (ref 150–400)
RBC: 4.39 MIL/uL (ref 3.87–5.11)
RDW: 17.2 % — ABNORMAL HIGH (ref 11.5–15.5)
WBC: 7.1 10*3/uL (ref 4.0–10.5)
nRBC: 0 % (ref 0.0–0.2)

## 2021-05-21 LAB — BASIC METABOLIC PANEL
Anion gap: 10 (ref 5–15)
BUN: 68 mg/dL — ABNORMAL HIGH (ref 8–23)
CO2: 28 mmol/L (ref 22–32)
Calcium: 10.2 mg/dL (ref 8.9–10.3)
Chloride: 99 mmol/L (ref 98–111)
Creatinine, Ser: 2.81 mg/dL — ABNORMAL HIGH (ref 0.44–1.00)
GFR, Estimated: 17 mL/min — ABNORMAL LOW (ref >60)
Glucose, Bld: 52 mg/dL — ABNORMAL LOW (ref 70–99)
Potassium: 5.3 mmol/L — ABNORMAL HIGH (ref 3.5–5.1)
Sodium: 137 mmol/L (ref 135–145)

## 2021-05-21 LAB — GLUCOSE, CAPILLARY
Glucose-Capillary: 58 mg/dL — ABNORMAL LOW (ref 70–99)
Glucose-Capillary: 76 mg/dL (ref 70–99)
Glucose-Capillary: 64 mg/dL — ABNORMAL LOW (ref 70–99)
Glucose-Capillary: 72 mg/dL (ref 70–99)
Glucose-Capillary: 78 mg/dL (ref 70–99)
Glucose-Capillary: 50 mg/dL — ABNORMAL LOW (ref 70–99)
Glucose-Capillary: 79 mg/dL (ref 70–99)

## 2021-05-21 SURGERY — EVACUATION HEMATOMA
Anesthesia: General | Site: Knee | Laterality: Left

## 2021-05-21 MED ORDER — LIDOCAINE 2% (20 MG/ML) 5 ML SYRINGE
INTRAMUSCULAR | Status: DC | PRN
  Administered 2021-05-21: 60 mg via INTRAVENOUS

## 2021-05-21 MED ORDER — 0.9 % SODIUM CHLORIDE (POUR BTL) OPTIME
TOPICAL | Status: DC | PRN
  Administered 2021-05-21: 1000 mL

## 2021-05-21 MED ORDER — DEXAMETHASONE SODIUM PHOSPHATE 10 MG/ML IJ SOLN
INTRAMUSCULAR | Status: AC
  Filled 2021-05-21: qty 1

## 2021-05-21 MED ORDER — HYDROCODONE-ACETAMINOPHEN 5-325 MG PO TABS: 1.0000 | ORAL_TABLET | Freq: Three times a day (TID) | ORAL | 0 refills | Status: AC | PRN

## 2021-05-21 MED ORDER — HYDROMORPHONE HCL 1 MG/ML IJ SOLN: 0.2500 mg | INTRAMUSCULAR | Status: DC | PRN

## 2021-05-21 MED ORDER — PHENYLEPHRINE 40 MCG/ML (10ML) SYRINGE FOR IV PUSH (FOR BLOOD PRESSURE SUPPORT)
PREFILLED_SYRINGE | INTRAVENOUS | Status: DC | PRN
  Administered 2021-05-21 (×6): 80 ug via INTRAVENOUS
  Administered 2021-05-21: 120 ug via INTRAVENOUS
  Administered 2021-05-21 (×2): 80 ug via INTRAVENOUS

## 2021-05-21 MED ORDER — LIDOCAINE-EPINEPHRINE 1 %-1:100000 IJ SOLN
INTRAMUSCULAR | Status: DC | PRN
  Administered 2021-05-21: 20 mL

## 2021-05-21 MED ORDER — CHLORHEXIDINE GLUCONATE CLOTH 2 % EX PADS: 6.0000 | MEDICATED_PAD | Freq: Once | CUTANEOUS | Status: DC

## 2021-05-21 MED ORDER — PROPOFOL 10 MG/ML IV BOLUS
INTRAVENOUS | Status: AC
  Filled 2021-05-21: qty 20

## 2021-05-21 MED ORDER — FENTANYL CITRATE (PF) 250 MCG/5ML IJ SOLN
INTRAMUSCULAR | Status: AC
  Filled 2021-05-21: qty 5

## 2021-05-21 MED ORDER — CEFAZOLIN SODIUM-DEXTROSE 2-3 GM-%(50ML) IV SOLR
INTRAVENOUS | Status: DC | PRN
  Administered 2021-05-21: 2 g via INTRAVENOUS

## 2021-05-21 MED ORDER — ONDANSETRON HCL 4 MG/2ML IJ SOLN: 4.0000 mg | Freq: Once | INTRAMUSCULAR | Status: DC | PRN

## 2021-05-21 MED ORDER — OXYCODONE HCL 5 MG PO TABS: 5.0000 mg | ORAL_TABLET | Freq: Once | ORAL | Status: DC | PRN

## 2021-05-21 MED ORDER — EPINEPHRINE PF 1 MG/ML IJ SOLN
INTRAMUSCULAR | Status: AC
  Filled 2021-05-21: qty 1

## 2021-05-21 MED ORDER — DEXTROSE 50 % IV SOLN
INTRAVENOUS | Status: AC
  Filled 2021-05-21: qty 50

## 2021-05-21 MED ORDER — DEXTROSE 50 % IV SOLN
1.0000 | Freq: Once | INTRAVENOUS | Status: AC
Start: 2021-05-21 — End: 2021-05-21
  Administered 2021-05-21: 50 mL via INTRAVENOUS

## 2021-05-21 MED ORDER — ONDANSETRON HCL 4 MG PO TABS: 4.0000 mg | ORAL_TABLET | Freq: Three times a day (TID) | ORAL | 0 refills | Status: AC | PRN

## 2021-05-21 MED ORDER — ONDANSETRON HCL 4 MG/2ML IJ SOLN
INTRAMUSCULAR | Status: DC | PRN
Start: 2021-05-21 — End: 2021-05-21
  Administered 2021-05-21: 4 mg via INTRAVENOUS

## 2021-05-21 MED ORDER — DEXTROSE IN LACTATED RINGERS 5 % IV SOLN
INTRAVENOUS | Status: DC
Start: 2021-05-21 — End: 2021-05-21

## 2021-05-21 MED ORDER — ONDANSETRON HCL 4 MG/2ML IJ SOLN
INTRAMUSCULAR | Status: AC
  Filled 2021-05-21: qty 2

## 2021-05-21 MED ORDER — LIDOCAINE-EPINEPHRINE 1 %-1:100000 IJ SOLN
INTRAMUSCULAR | Status: AC
  Filled 2021-05-21: qty 1

## 2021-05-21 MED ORDER — OXYCODONE HCL 5 MG/5ML PO SOLN: 5.0000 mg | Freq: Once | ORAL | Status: DC | PRN

## 2021-05-21 MED ORDER — FENTANYL CITRATE (PF) 100 MCG/2ML IJ SOLN
INTRAMUSCULAR | Status: DC | PRN
  Administered 2021-05-21: 25 ug via INTRAVENOUS

## 2021-05-21 MED ORDER — ORAL CARE MOUTH RINSE: 15.0000 mL | Freq: Once | OROMUCOSAL | Status: AC

## 2021-05-21 MED ORDER — LIDOCAINE 2% (20 MG/ML) 5 ML SYRINGE
INTRAMUSCULAR | Status: AC
  Filled 2021-05-21: qty 5

## 2021-05-21 MED ORDER — PROPOFOL 10 MG/ML IV BOLUS
INTRAVENOUS | Status: DC | PRN
  Administered 2021-05-21: 130 mg via INTRAVENOUS

## 2021-05-21 MED ORDER — DEXTROSE 50 % IV SOLN
INTRAVENOUS | Status: AC
  Administered 2021-05-21: 12.5 g via INTRAVENOUS
  Filled 2021-05-21: qty 50

## 2021-05-21 MED ORDER — CHLORHEXIDINE GLUCONATE 0.12 % MT SOLN
15.0000 mL | Freq: Once | OROMUCOSAL | Status: AC
  Administered 2021-05-21: 15 mL via OROMUCOSAL
  Filled 2021-05-21: qty 15

## 2021-05-21 MED ORDER — AMISULPRIDE (ANTIEMETIC) 5 MG/2ML IV SOLN: 10.0000 mg | Freq: Once | INTRAVENOUS | Status: DC | PRN

## 2021-05-21 MED ORDER — DEXTROSE 50 % IV SOLN
12.5000 g | INTRAVENOUS | Status: AC
  Administered 2021-05-21: 12.5 g via INTRAVENOUS

## 2021-05-21 MED ORDER — DEXAMETHASONE SODIUM PHOSPHATE 10 MG/ML IJ SOLN
INTRAMUSCULAR | Status: DC | PRN
  Administered 2021-05-21: 4 mg via INTRAVENOUS

## 2021-05-21 MED ORDER — DEXTROSE 50 % IV SOLN
12.5000 g | INTRAVENOUS | Status: AC
  Filled 2021-05-21: qty 50

## 2021-05-21 MED ORDER — ACETAMINOPHEN 500 MG PO TABS
1000.0000 mg | ORAL_TABLET | Freq: Once | ORAL | Status: AC
  Administered 2021-05-21: 1000 mg via ORAL
  Filled 2021-05-21: qty 2

## 2021-05-21 MED ORDER — SODIUM CHLORIDE 0.9 % IV SOLN: INTRAVENOUS | Status: DC

## 2021-05-21 SURGICAL SUPPLY — 34 items
APL SKNCLS STERI-STRIP NONHPOA (GAUZE/BANDAGES/DRESSINGS)
BENZOIN TINCTURE PRP APPL 2/3 (GAUZE/BANDAGES/DRESSINGS) ×1 IMPLANT
BIOPATCH RED 1 DISK 7.0 (GAUZE/BANDAGES/DRESSINGS) ×1 IMPLANT
BNDG GAUZE ELAST 4 BULKY (GAUZE/BANDAGES/DRESSINGS) ×1 IMPLANT
CANISTER SUCT 3000ML PPV (MISCELLANEOUS) ×2 IMPLANT
COVER SURGICAL LIGHT HANDLE (MISCELLANEOUS) ×2 IMPLANT
DRAPE IMP U-DRAPE 54X76 (DRAPES) ×2 IMPLANT
DRESSING MEPILEX FLEX 4X4 (GAUZE/BANDAGES/DRESSINGS) IMPLANT
DRSG MEPILEX FLEX 4X4 (GAUZE/BANDAGES/DRESSINGS) ×2
DRSG TEGADERM 4X4.75 (GAUZE/BANDAGES/DRESSINGS) ×1 IMPLANT
ELECT REM PT RETURN 9FT ADLT (ELECTROSURGICAL) ×2
ELECTRODE REM PT RTRN 9FT ADLT (ELECTROSURGICAL) ×1 IMPLANT
EVACUATOR SILICONE 100CC (DRAIN) ×1 IMPLANT
GLOVE SURG ENC MOIS LTX SZ7.5 (GLOVE) ×2 IMPLANT
GOWN STRL REUS W/ TWL LRG LVL3 (GOWN DISPOSABLE) ×1 IMPLANT
GOWN STRL REUS W/TWL LRG LVL3 (GOWN DISPOSABLE) ×2
GOWN STRL REUS W/TWL XL LVL3 (GOWN DISPOSABLE) ×4 IMPLANT
HANDPIECE INTERPULSE COAX TIP (DISPOSABLE) ×2
IMMOBILIZER KNEE 22 UNIV (SOFTGOODS) ×1 IMPLANT
KIT BASIN OR (CUSTOM PROCEDURE TRAY) ×2 IMPLANT
KIT TURNOVER KIT B (KITS) ×2 IMPLANT
NDL HYPO 25GX1X1/2 BEV (NEEDLE) ×1 IMPLANT
NEEDLE HYPO 25GX1X1/2 BEV (NEEDLE) ×2 IMPLANT
NS IRRIG 1000ML POUR BTL (IV SOLUTION) ×2 IMPLANT
PACK ORTHO EXTREMITY (CUSTOM PROCEDURE TRAY) ×1 IMPLANT
PAD ABD 8X10 STRL (GAUZE/BANDAGES/DRESSINGS) ×1 IMPLANT
PAD ARMBOARD 7.5X6 YLW CONV (MISCELLANEOUS) ×4 IMPLANT
SET HNDPC FAN SPRY TIP SCT (DISPOSABLE) IMPLANT
SUT ETHILON 3 0 FSL (SUTURE) ×1 IMPLANT
SUT PDS AB 3-0 SH 27 (SUTURE) ×1 IMPLANT
SUT SILK 2 0 PERMA HAND 18 BK (SUTURE) ×1 IMPLANT
SYR CONTROL 10ML LL (SYRINGE) ×2 IMPLANT
TOWEL GREEN STERILE (TOWEL DISPOSABLE) ×2 IMPLANT
UNDERPAD 30X36 HEAVY ABSORB (UNDERPADS AND DIAPERS) ×2 IMPLANT

## 2021-05-21 NOTE — Anesthesia Postprocedure Evaluation (Signed)
Anesthesia Post Note ? ?Patient: Kristine [name] ? ?Procedure(s) Performed: EVACUATION LEFT KNEE HEMATOMA (Left: Knee) ? ?  ? ?Patient location during evaluation: PACU ?Anesthesia Type: General ?Level of consciousness: awake and alert, oriented and patient cooperative ?Pain management: pain level controlled ?Vital Signs Assessment: post-procedure vital signs reviewed and stable ?Respiratory status: spontaneous breathing, nonlabored ventilation and respiratory function stable ?Cardiovascular status: blood pressure returned to baseline and stable ?Postop Assessment: no apparent nausea or vomiting ?Anesthetic complications: no ?Comments: Has been hypoglycemic in preop and PACU- given dextrose in preop and PACU, will send to floor w/ dextrose infusion running ? ? ?No notable events documented. ? ?Last Vitals:  ?Vitals:  ? 05/21/21 1451 05/21/21 1506  ?BP: 115/62 (!) 130/92  ?Pulse:  71  ?Resp: 14 15  ?Temp: (!) 36.1 ?C   ?SpO2: 100% 100%  ?  ?Last Pain:  ?Vitals:  ? 05/21/21 1451  ?PainSc: Asleep  ? ? ?  ?  ?  ?  ?  ?  ? ?Jarome Matin Jadie Allington ? ? ? ? ?

## 2021-05-21 NOTE — Discharge Instructions (Addendum)
Activity As tolerated: NO showers for 48 hours. ?NO driving ?No heavy activities ? ?Diet: Regular ? ?Wound Care: Keep dressing clean & dry ?You may remove the knee immobilizer to shower after 48 hours. Please place immobilizer back on after showering and leave on at all times otherwise. ? ?Special Instructions: ?Continue to empty, recharge, & record drainage from drains 2-3 times a day, as needed. ? ?Call Doctor if any unusual problems occur such as pain, excessive ?Bleeding, unrelieved Nausea/vomiting, Fever &/or chills ? ? ?

## 2021-05-21 NOTE — Transfer of Care (Signed)
Immediate Anesthesia Transfer of Care Note ? ?Patient: Kristine [name] ? ?Procedure(s) Performed: EVACUATION LEFT KNEE HEMATOMA (Left: Knee) ? ?Patient Location: PACU ? ?Anesthesia Type:General ? ?Level of Consciousness: drowsy and patient cooperative ? ?Airway & Oxygen Therapy: Patient Spontanous Breathing and Patient connected to face mask oxygen ? ?Post-op Assessment: Report given to RN and Post -op Vital signs reviewed and stable ? ?Post vital signs: Reviewed and stable ? ?Last Vitals:  ?Vitals Value Taken Time  ?BP 115/72   ?Temp    ?Pulse 71   ?Resp 14   ?SpO2 98%   ? ? ?Last Pain:  ?Vitals:  ? 05/21/21 1122  ?PainSc: 0-No pain  ?   ? ?  ? ?Complications: No notable events documented. ?

## 2021-05-21 NOTE — Progress Notes (Signed)
CBG at 1235= 72.  ?

## 2021-05-21 NOTE — Op Note (Signed)
Operative Note  ? ?DATE OF OPERATION: 05/21/2021 ? ?SURGICAL DEPARTMENT: Plastic Surgery ? ?PREOPERATIVE DIAGNOSES:  left knee hematoma ? ?POSTOPERATIVE DIAGNOSES:  same ? ?PROCEDURE:   ?Drainage of left knee hematoma ?Placement of 15 Fr blake drain ? ?SURGEON: Melene Plan. Adarius Tigges, MD ? ?ASSISTANT: Software engineer, PA ? ?ANESTHESIA:  General.  ? ?COMPLICATIONS: None.  ? ?INDICATIONS FOR PROCEDURE:  ?The patient, Kristine [name] is a 76 y.o. female born on [birth year], is here for treatment of left knee hematoma ?MRN: [id number] ? ?CONSENT:  ?Informed consent was obtained directly from the patient. Risks, benefits and alternatives were fully discussed. Specific risks including but not limited to bleeding, infection, hematoma, seroma, scarring, pain, contracture, asymmetry, wound healing problems, and need for further surgery were all discussed. The patient did have an ample opportunity to have questions answered to satisfaction.  ? ?DESCRIPTION OF PROCEDURE:  ?The patient was taken to the operating room. SCDs were placed and  antibiotics were given. General anesthesia was administered.  The patient's operative site was prepped and draped in a sterile fashion. A time out was performed and all information was confirmed to be correct.   ? ?After prepping and draping the left leg 1% lidocaine with epinephrine was used to inject the incision.  An incision was made with a 15 blade.  Carried dissection down to the hematoma cavity.  Significant fluid was expressed along with mostly organized hematoma.  The pathology organized hematoma was removed and passed off the table.  Additional hematoma was removed by pulse lavaging.  After confirming hemostasis a 15 [place] Blake drain was applied and the wound was then closed in a layered fashion using 3-0 PDS for dermis followed by 2-0 nylon in a horizontal mattress for skin. ? ?The patient tolerated the procedure well.  There were no complications. The patient was allowed to wake from  anesthesia, extubated and taken to the recovery room in satisfactory condition.   ?

## 2021-05-21 NOTE — Interval H&P Note (Signed)
History and Physical Interval Note: ? ?05/21/2021 ?11:41 AM ? ?Kristine [name]  has presented today for surgery, with the diagnosis of Hematoma of left knee region.  The various methods of treatment have been discussed with the patient and family. After consideration of risks, benefits and other options for treatment, the patient has consented to  Procedure(s): ?EVACUATION LEFT KNEE HEMATOMA (Left) as a surgical intervention.  The patient's history has been reviewed, patient examined, no change in status, stable for surgery.  I have reviewed the patient's chart and labs.  Questions were answered to the patient's satisfaction.   ? ? ?Lennice Sites ? ? ?

## 2021-05-21 NOTE — Progress Notes (Signed)
CBG 64 one hour after Dextrose 50% 12.5 g was given at 1213. Dr. Doroteo Glassman made aware and another Dextrose 50% 12.5 g given per protocol. Will recheck CBG in 15 min.  ?

## 2021-05-21 NOTE — Anesthesia Procedure Notes (Signed)
Procedure Name: LMA Insertion ?Date/Time: 05/21/2021 1:53 PM ?Performed by: Genelle Bal, CRNA ?Pre-anesthesia Checklist: Patient identified, Emergency Drugs available, Suction available and Patient being monitored ?Patient Re-evaluated:Patient Re-evaluated prior to induction ?Oxygen Delivery Method: Circle system utilized ?Preoxygenation: Pre-oxygenation with 100% oxygen ?Induction Type: IV induction ?Ventilation: Mask ventilation without difficulty ?LMA: LMA inserted ?LMA Size: 4.0 ?Number of attempts: 1 ?Airway Equipment and Method: Bite block ?Placement Confirmation: positive ETCO2 ?Tube secured with: Tape ?Dental Injury: Teeth and Oropharynx as per pre-operative assessment  ? ? ? ? ?

## 2021-05-22 ENCOUNTER — Encounter (HOSPITAL_COMMUNITY): Payer: Self-pay | Admitting: Plastic Surgery

## 2021-05-30 ENCOUNTER — Encounter: Payer: Self-pay | Admitting: Plastic Surgery

## 2021-05-30 ENCOUNTER — Ambulatory Visit (INDEPENDENT_AMBULATORY_CARE_PROVIDER_SITE_OTHER): Payer: Medicare HMO | Admitting: Plastic Surgery

## 2021-05-30 ENCOUNTER — Other Ambulatory Visit: Payer: Self-pay

## 2021-05-30 DIAGNOSIS — S8002XA Contusion of left knee, initial encounter: Secondary | ICD-10-CM

## 2021-05-30 NOTE — Progress Notes (Signed)
Status post evacuation of left knee hematoma on 05/21/2021 ? ?Physical exam. ?Drain: 18 cc light serosanguineous fluid ?Extremity: Incision clean dry and intact, skin intact with some discoloration ? ?Assessment and plan ?Drain removal today and will follow-up in approximately 2 weeks for suture removal and to make sure there is no development of seroma. ?

## 2021-06-02 ENCOUNTER — Telehealth: Payer: Self-pay | Admitting: Neurology

## 2021-06-02 DIAGNOSIS — R269 Unspecified abnormalities of gait and mobility: Secondary | ICD-10-CM

## 2021-06-02 DIAGNOSIS — E1142 Type 2 diabetes mellitus with diabetic polyneuropathy: Secondary | ICD-10-CM

## 2021-06-02 NOTE — Telephone Encounter (Signed)
Amy Benjamin Stain PT from [facility] called and LVM wanting to know if provider would write an order for the pt to get a 2 wheeled rolly walker and send it to [facility]. The one pt has is not safe to use any longer. Please advise.  ?

## 2021-06-02 NOTE — Telephone Encounter (Signed)
I spoke to the [employer], PT. She is aware a signed rx will be faxed to [place] today. ?

## 2021-06-03 ENCOUNTER — Telehealth: Payer: Self-pay

## 2021-06-03 NOTE — Telephone Encounter (Signed)
Patient called in stating her leg is very warm and hot to the touch, asked for a call back. ?

## 2021-06-03 NOTE — Telephone Encounter (Signed)
error 

## 2021-06-03 NOTE — Telephone Encounter (Signed)
Patient's spouse called in stating it is warm to the touch, and a bit swollen. Very concerned, asked if someone would be able to give them a call back today.  ?

## 2021-06-04 ENCOUNTER — Other Ambulatory Visit: Payer: Self-pay

## 2021-06-04 ENCOUNTER — Encounter: Payer: Self-pay | Admitting: Physician Assistant

## 2021-06-04 ENCOUNTER — Ambulatory Visit: Payer: Medicare HMO | Admitting: Physician Assistant

## 2021-06-04 DIAGNOSIS — S8002XA Contusion of left knee, initial encounter: Secondary | ICD-10-CM

## 2021-06-04 MED ORDER — DOXYCYCLINE HYCLATE 100 MG PO TABS: 100.0000 mg | ORAL_TABLET | Freq: Two times a day (BID) | ORAL | 0 refills | Status: AC

## 2021-06-04 NOTE — Telephone Encounter (Signed)
Called on (06/03/21) and spoke with the patient regarding the message below.  Patient stated that she having swelling,pain from behind the left knee all way down the leg.  Rash around the leg and it's hot to the touch.   ? ?Patient stated that she noticed the swelling on Sunday.  But the swelling got worse on Monday. Patient stated that it reminds her of cellulitis.   ? ?Informed the patient that I will give Dr. Erin Hearing a call and inform him of her message.  Patient verbalized understanding and agreed. ? ?Called the patient back and informed her that I spoke with Dr. Melvenia Needles, and he will be given her a call.  Patient verbalized understanding and agreed.//AB/CMA ?

## 2021-06-04 NOTE — Progress Notes (Signed)
Patient is a pleasant 76 year old female s/p evacuation of left knee hematoma performed by Dr. Erin Hearing 05/21/2021 who returns to clinic with concern for infection. ? ?I briefly reviewed medical record and she had sustained mechanical fall while taking Xarelto which resulted in a large left knee hematoma.  Range of motion was limited due to her significant swelling.  After the hematoma evacuation 05/21/2021, she returned for postoperative follow-up on 05/30/2021.  At that time, the drain was removed and plan was for her to return in 2 weeks. ? ?Today, patient is accompanied by her husband at bedside.  She states that she had been doing well, but over the course of the past 3 days they have noticed an abrupt change.  They feel as though her left lower extremity has become more swollen despite elevating the leg.  They have also noticed som new redness.  She denies any new or worsening pain symptoms or tenderness.  She has been checking her temperature regularly at home, denies any fevers.  Patient also states that she has considerably improved range of motion compared to how she was preop.  She also denies any chest pain or difficulty breathing.  She had resumed her daily Xarelto day after surgery and has been taking it regularly. ? ?On physical exam, her left lower leg does appear to be more swollen than her right lower leg.  Nonpitting.  Not indurated.  No significant warmth appreciated.  Knee without any considerable swelling or obvious effusion.  No swelling superior to the knee.  No lymphangitis.  Pedal pulse intact and symmetrical contralateral leg.  Sensation largely intact, some diminished sensation distally (known peripheral neuropathy). ? ?Unclear as to whether or not her new onset left lower extremity swelling is related to dependent edema or if her combination of symptoms is reflective of early infection.  Lower suspicion for DVT, particularly given that she is taking Xarelto daily as directed.  She does have a  degree of venous insufficiency with early stasis dermatitis on right lower extremity.  Endorses family history of venous insufficiency. ? ?Discussed case with Dr. Erin Hearing who also evaluated patient.  Will cover for possible cellulitis with doxycycline x7 days.  She will return to clinic next week for reevaluation.  She will call clinic should she have any new or worsening symptoms. ?

## 2021-06-09 ENCOUNTER — Ambulatory Visit (INDEPENDENT_AMBULATORY_CARE_PROVIDER_SITE_OTHER): Payer: Medicare HMO | Admitting: Plastic Surgery

## 2021-06-09 ENCOUNTER — Other Ambulatory Visit: Payer: Self-pay

## 2021-06-09 DIAGNOSIS — S8002XA Contusion of left knee, initial encounter: Secondary | ICD-10-CM

## 2021-06-10 ENCOUNTER — Encounter: Payer: Self-pay | Admitting: Plastic Surgery

## 2021-06-10 NOTE — Progress Notes (Signed)
Status post evacuation of left knee hematoma 05/21/2021.  She is doing well without complaints. ? ?Physical exam ?No return of hematoma, incision clean dry and intact with sutures intact. ? ?Assessment and plan ?Sutures removed we will see the patient back for recheck in 3 to 4 weeks. ?

## 2021-06-11 ENCOUNTER — Telehealth: Payer: Self-pay | Admitting: *Deleted

## 2021-06-11 NOTE — Telephone Encounter (Signed)
Pt GTL form faxed on 06/11/21 to [facility].  ?

## 2021-06-13 ENCOUNTER — Ambulatory Visit: Payer: Medicare HMO | Admitting: Plastic Surgery

## 2021-07-10 ENCOUNTER — Encounter (INDEPENDENT_AMBULATORY_CARE_PROVIDER_SITE_OTHER): Payer: Self-pay

## 2021-07-11 ENCOUNTER — Ambulatory Visit (INDEPENDENT_AMBULATORY_CARE_PROVIDER_SITE_OTHER): Payer: Medicare HMO | Admitting: Plastic Surgery

## 2021-07-11 DIAGNOSIS — S8002XA Contusion of left knee, initial encounter: Secondary | ICD-10-CM

## 2021-07-11 NOTE — Progress Notes (Signed)
Patient is status post I&D of hematoma.  She notes that she is getting around pretty well with a walker.  She is returned to close to her baseline. ? ?Physical exam ?Incision intact, no evidence of residual hematoma or seroma. ? ?Assessment and plan ?Patient is doing well after I&D of left knee hematoma.  She can return to her activity as tolerated.  We will see her back as an as-needed basis. ?

## 2021-07-21 ENCOUNTER — Other Ambulatory Visit (HOSPITAL_COMMUNITY): Payer: Self-pay

## 2021-07-22 ENCOUNTER — Encounter (HOSPITAL_COMMUNITY)
Admission: RE | Admit: 2021-07-22 | Discharge: 2021-07-22 | Disposition: A | Discharge status: Home / Self Care | Payer: Medicare HMO | Source: Ambulatory Visit | Attending: Nephrology | Admitting: Nephrology

## 2021-07-22 DIAGNOSIS — D631 Anemia in chronic kidney disease: Secondary | ICD-10-CM | POA: Diagnosis present

## 2021-07-22 DIAGNOSIS — N189 Chronic kidney disease, unspecified: Secondary | ICD-10-CM | POA: Insufficient documentation

## 2021-07-22 MED ORDER — SODIUM CHLORIDE 0.9 % IV SOLN
510.0000 mg | INTRAVENOUS | Status: DC
  Administered 2021-07-22: 510 mg via INTRAVENOUS
  Filled 2021-07-22: qty 510

## 2021-07-29 ENCOUNTER — Encounter (HOSPITAL_COMMUNITY)
Admission: RE | Admit: 2021-07-29 | Discharge: 2021-07-29 | Disposition: A | Discharge status: Home / Self Care | Payer: Medicare HMO | Source: Ambulatory Visit | Attending: Nephrology | Admitting: Nephrology

## 2021-07-29 DIAGNOSIS — N189 Chronic kidney disease, unspecified: Secondary | ICD-10-CM | POA: Diagnosis not present

## 2021-07-29 MED ORDER — SODIUM CHLORIDE 0.9 % IV SOLN
510.0000 mg | INTRAVENOUS | Status: DC
  Administered 2021-07-29: 510 mg via INTRAVENOUS
  Filled 2021-07-29: qty 510

## 2021-08-28 ENCOUNTER — Other Ambulatory Visit: Payer: Self-pay | Admitting: Cardiovascular Disease

## 2021-09-04 ENCOUNTER — Telehealth: Payer: Self-pay | Admitting: Hematology

## 2021-09-04 NOTE — Telephone Encounter (Signed)
Scheduled appt per 6/14 referral. Pt is aware of appt date and time. Pt is aware to arrive 15 mins prior to appt time and to bring and updated insurance card. Pt is aware of appt location.   

## 2021-09-11 ENCOUNTER — Emergency Department (HOSPITAL_COMMUNITY): Payer: Medicare HMO

## 2021-09-11 ENCOUNTER — Encounter (HOSPITAL_COMMUNITY): Payer: Self-pay | Admitting: Emergency Medicine

## 2021-09-11 ENCOUNTER — Inpatient Hospital Stay (HOSPITAL_COMMUNITY)
Admission: EM | Admit: 2021-09-11 | Discharge: 2021-09-19 | DRG: 291 | Disposition: A | Discharge status: Home Health | Payer: Medicare HMO | Attending: Internal Medicine | Admitting: Internal Medicine

## 2021-09-11 DIAGNOSIS — M810 Age-related osteoporosis without current pathological fracture: Secondary | ICD-10-CM | POA: Diagnosis present

## 2021-09-11 DIAGNOSIS — Z7901 Long term (current) use of anticoagulants: Secondary | ICD-10-CM

## 2021-09-11 DIAGNOSIS — I5041 Acute combined systolic (congestive) and diastolic (congestive) heart failure: Secondary | ICD-10-CM | POA: Diagnosis not present

## 2021-09-11 DIAGNOSIS — I083 Combined rheumatic disorders of mitral, aortic and tricuspid valves: Secondary | ICD-10-CM | POA: Diagnosis present

## 2021-09-11 DIAGNOSIS — J449 Chronic obstructive pulmonary disease, unspecified: Secondary | ICD-10-CM | POA: Diagnosis present

## 2021-09-11 DIAGNOSIS — I5043 Acute on chronic combined systolic (congestive) and diastolic (congestive) heart failure: Secondary | ICD-10-CM | POA: Diagnosis present

## 2021-09-11 DIAGNOSIS — E11621 Type 2 diabetes mellitus with foot ulcer: Secondary | ICD-10-CM | POA: Diagnosis present

## 2021-09-11 DIAGNOSIS — E7800 Pure hypercholesterolemia, unspecified: Secondary | ICD-10-CM | POA: Diagnosis present

## 2021-09-11 DIAGNOSIS — I5031 Acute diastolic (congestive) heart failure: Secondary | ICD-10-CM | POA: Diagnosis not present

## 2021-09-11 DIAGNOSIS — E039 Hypothyroidism, unspecified: Secondary | ICD-10-CM | POA: Diagnosis present

## 2021-09-11 DIAGNOSIS — Z9049 Acquired absence of other specified parts of digestive tract: Secondary | ICD-10-CM

## 2021-09-11 DIAGNOSIS — I509 Heart failure, unspecified: Secondary | ICD-10-CM

## 2021-09-11 DIAGNOSIS — K219 Gastro-esophageal reflux disease without esophagitis: Secondary | ICD-10-CM | POA: Diagnosis present

## 2021-09-11 DIAGNOSIS — I50813 Acute on chronic right heart failure: Secondary | ICD-10-CM | POA: Diagnosis not present

## 2021-09-11 DIAGNOSIS — Z713 Dietary counseling and surveillance: Secondary | ICD-10-CM

## 2021-09-11 DIAGNOSIS — I272 Pulmonary hypertension, unspecified: Secondary | ICD-10-CM | POA: Diagnosis present

## 2021-09-11 DIAGNOSIS — Z8249 Family history of ischemic heart disease and other diseases of the circulatory system: Secondary | ICD-10-CM

## 2021-09-11 DIAGNOSIS — B372 Candidiasis of skin and nail: Secondary | ICD-10-CM | POA: Diagnosis present

## 2021-09-11 DIAGNOSIS — I5042 Chronic combined systolic (congestive) and diastolic (congestive) heart failure: Secondary | ICD-10-CM

## 2021-09-11 DIAGNOSIS — Z880 Allergy status to penicillin: Secondary | ICD-10-CM

## 2021-09-11 DIAGNOSIS — R6883 Chills (without fever): Secondary | ICD-10-CM | POA: Diagnosis not present

## 2021-09-11 DIAGNOSIS — E1142 Type 2 diabetes mellitus with diabetic polyneuropathy: Secondary | ICD-10-CM | POA: Diagnosis present

## 2021-09-11 DIAGNOSIS — E1122 Type 2 diabetes mellitus with diabetic chronic kidney disease: Secondary | ICD-10-CM | POA: Diagnosis present

## 2021-09-11 DIAGNOSIS — I4819 Other persistent atrial fibrillation: Secondary | ICD-10-CM | POA: Diagnosis present

## 2021-09-11 DIAGNOSIS — J810 Acute pulmonary edema: Secondary | ICD-10-CM | POA: Diagnosis not present

## 2021-09-11 DIAGNOSIS — E11610 Type 2 diabetes mellitus with diabetic neuropathic arthropathy: Secondary | ICD-10-CM | POA: Diagnosis present

## 2021-09-11 DIAGNOSIS — Z79899 Other long term (current) drug therapy: Secondary | ICD-10-CM

## 2021-09-11 DIAGNOSIS — I5082 Biventricular heart failure: Secondary | ICD-10-CM | POA: Diagnosis present

## 2021-09-11 DIAGNOSIS — D72829 Elevated white blood cell count, unspecified: Secondary | ICD-10-CM | POA: Diagnosis not present

## 2021-09-11 DIAGNOSIS — E559 Vitamin D deficiency, unspecified: Secondary | ICD-10-CM | POA: Diagnosis present

## 2021-09-11 DIAGNOSIS — I50811 Acute right heart failure: Secondary | ICD-10-CM | POA: Diagnosis not present

## 2021-09-11 DIAGNOSIS — N184 Chronic kidney disease, stage 4 (severe): Secondary | ICD-10-CM | POA: Diagnosis present

## 2021-09-11 DIAGNOSIS — I428 Other cardiomyopathies: Secondary | ICD-10-CM | POA: Diagnosis present

## 2021-09-11 DIAGNOSIS — Z888 Allergy status to other drugs, medicaments and biological substances status: Secondary | ICD-10-CM

## 2021-09-11 DIAGNOSIS — Z79891 Long term (current) use of opiate analgesic: Secondary | ICD-10-CM

## 2021-09-11 DIAGNOSIS — Z6841 Body Mass Index (BMI) 40.0 and over, adult: Secondary | ICD-10-CM | POA: Diagnosis not present

## 2021-09-11 DIAGNOSIS — Z79890 Hormone replacement therapy (postmenopausal): Secondary | ICD-10-CM

## 2021-09-11 DIAGNOSIS — L97529 Non-pressure chronic ulcer of other part of left foot with unspecified severity: Secondary | ICD-10-CM | POA: Diagnosis present

## 2021-09-11 DIAGNOSIS — F419 Anxiety disorder, unspecified: Secondary | ICD-10-CM | POA: Diagnosis present

## 2021-09-11 DIAGNOSIS — I10 Essential (primary) hypertension: Secondary | ICD-10-CM | POA: Diagnosis present

## 2021-09-11 DIAGNOSIS — M1990 Unspecified osteoarthritis, unspecified site: Secondary | ICD-10-CM | POA: Diagnosis present

## 2021-09-11 DIAGNOSIS — E876 Hypokalemia: Secondary | ICD-10-CM | POA: Diagnosis present

## 2021-09-11 DIAGNOSIS — Z881 Allergy status to other antibiotic agents status: Secondary | ICD-10-CM

## 2021-09-11 DIAGNOSIS — I5023 Acute on chronic systolic (congestive) heart failure: Secondary | ICD-10-CM | POA: Diagnosis not present

## 2021-09-11 DIAGNOSIS — Z794 Long term (current) use of insulin: Secondary | ICD-10-CM | POA: Diagnosis not present

## 2021-09-11 DIAGNOSIS — Z91030 Bee allergy status: Secondary | ICD-10-CM

## 2021-09-11 DIAGNOSIS — E119 Type 2 diabetes mellitus without complications: Secondary | ICD-10-CM

## 2021-09-11 DIAGNOSIS — R0600 Dyspnea, unspecified: Secondary | ICD-10-CM | POA: Diagnosis present

## 2021-09-11 DIAGNOSIS — Z91041 Radiographic dye allergy status: Secondary | ICD-10-CM

## 2021-09-11 DIAGNOSIS — I130 Hypertensive heart and chronic kidney disease with heart failure and stage 1 through stage 4 chronic kidney disease, or unspecified chronic kidney disease: Principal | ICD-10-CM | POA: Diagnosis present

## 2021-09-11 DIAGNOSIS — Z90710 Acquired absence of both cervix and uterus: Secondary | ICD-10-CM

## 2021-09-11 DIAGNOSIS — I482 Chronic atrial fibrillation, unspecified: Secondary | ICD-10-CM | POA: Diagnosis not present

## 2021-09-11 DIAGNOSIS — Z7985 Long-term (current) use of injectable non-insulin antidiabetic drugs: Secondary | ICD-10-CM

## 2021-09-11 DIAGNOSIS — G4733 Obstructive sleep apnea (adult) (pediatric): Secondary | ICD-10-CM | POA: Diagnosis present

## 2021-09-11 LAB — URINALYSIS, COMPLETE (UACMP) WITH MICROSCOPIC
Bacteria, UA: NONE SEEN
Bilirubin Urine: NEGATIVE
Glucose, UA: NEGATIVE mg/dL
Hgb urine dipstick: NEGATIVE
Ketones, ur: NEGATIVE mg/dL
Leukocytes,Ua: NEGATIVE
Nitrite: NEGATIVE
Protein, ur: NEGATIVE mg/dL
Specific Gravity, Urine: 1.005 (ref 1.005–1.030)
pH: 6 (ref 5.0–8.0)

## 2021-09-11 LAB — CBC WITH DIFFERENTIAL/PLATELET
Abs Immature Granulocytes: 0.12 10*3/uL — ABNORMAL HIGH (ref 0.00–0.07)
Basophils Absolute: 0.1 10*3/uL (ref 0.0–0.1)
Basophils Relative: 1 %
Eosinophils Absolute: 0.3 10*3/uL (ref 0.0–0.5)
Eosinophils Relative: 3 %
HCT: 40.1 % (ref 36.0–46.0)
Hemoglobin: 12.7 g/dL (ref 12.0–15.0)
Immature Granulocytes: 1 %
Lymphocytes Relative: 18 %
Lymphs Abs: 1.9 10*3/uL (ref 0.7–4.0)
MCH: 29.6 pg (ref 26.0–34.0)
MCHC: 31.7 g/dL (ref 30.0–36.0)
MCV: 93.5 fL (ref 80.0–100.0)
Monocytes Absolute: 0.7 10*3/uL (ref 0.1–1.0)
Monocytes Relative: 6 %
Neutro Abs: 7.4 10*3/uL (ref 1.7–7.7)
Neutrophils Relative %: 71 %
Platelets: 166 10*3/uL (ref 150–400)
RBC: 4.29 MIL/uL (ref 3.87–5.11)
RDW: 19.1 % — ABNORMAL HIGH (ref 11.5–15.5)
WBC: 10.4 10*3/uL (ref 4.0–10.5)
nRBC: 0 % (ref 0.0–0.2)

## 2021-09-11 LAB — BASIC METABOLIC PANEL
Anion gap: 12 (ref 5–15)
BUN: 51 mg/dL — ABNORMAL HIGH (ref 8–23)
CO2: 28 mmol/L (ref 22–32)
Calcium: 9.8 mg/dL (ref 8.9–10.3)
Chloride: 97 mmol/L — ABNORMAL LOW (ref 98–111)
Creatinine, Ser: 2.49 mg/dL — ABNORMAL HIGH (ref 0.44–1.00)
GFR, Estimated: 20 mL/min — ABNORMAL LOW (ref >60)
Glucose, Bld: 183 mg/dL — ABNORMAL HIGH (ref 70–99)
Potassium: 3 mmol/L — ABNORMAL LOW (ref 3.5–5.1)
Sodium: 137 mmol/L (ref 135–145)

## 2021-09-11 LAB — TROPONIN I (HIGH SENSITIVITY)
Troponin I (High Sensitivity): 51 ng/L — ABNORMAL HIGH (ref <18)
Troponin I (High Sensitivity): 51 ng/L — ABNORMAL HIGH (ref <18)

## 2021-09-11 LAB — BRAIN NATRIURETIC PEPTIDE: B Natriuretic Peptide: 368.7 pg/mL — ABNORMAL HIGH (ref 0.0–100.0)

## 2021-09-11 LAB — MAGNESIUM: Magnesium: 2.7 mg/dL — ABNORMAL HIGH (ref 1.7–2.4)

## 2021-09-11 MED ORDER — TRAMADOL HCL 50 MG PO TABS
50.0000 mg | ORAL_TABLET | Freq: Three times a day (TID) | ORAL | Status: DC | PRN
  Administered 2021-09-12 – 2021-09-16 (×3): 50 mg via ORAL
  Filled 2021-09-11 (×3): qty 1

## 2021-09-11 MED ORDER — POTASSIUM CHLORIDE CRYS ER 20 MEQ PO TBCR
40.0000 meq | EXTENDED_RELEASE_TABLET | Freq: Once | ORAL | Status: AC
  Administered 2021-09-11: 40 meq via ORAL
  Filled 2021-09-11: qty 2

## 2021-09-11 MED ORDER — SODIUM CHLORIDE 0.9% FLUSH: 3.0000 mL | INTRAVENOUS | Status: DC | PRN

## 2021-09-11 MED ORDER — SPIRONOLACTONE 25 MG PO TABS
25.0000 mg | ORAL_TABLET | Freq: Every day | ORAL | Status: DC
  Administered 2021-09-12: 25 mg via ORAL
  Filled 2021-09-11 (×2): qty 1

## 2021-09-11 MED ORDER — MAGNESIUM SULFATE IN D5W 1-5 GM/100ML-% IV SOLN: 1.0000 g | Freq: Once | INTRAVENOUS | Status: DC

## 2021-09-11 MED ORDER — LEVOTHYROXINE SODIUM 88 MCG PO TABS
88.0000 ug | ORAL_TABLET | Freq: Every morning | ORAL | Status: DC
  Administered 2021-09-12 – 2021-09-19 (×8): 88 ug via ORAL
  Filled 2021-09-11 (×9): qty 1

## 2021-09-11 MED ORDER — METOPROLOL TARTRATE 25 MG PO TABS: 25.0000 mg | ORAL_TABLET | Freq: Every day | ORAL | Status: DC

## 2021-09-11 MED ORDER — ALPRAZOLAM 0.5 MG PO TABS
0.5000 mg | ORAL_TABLET | Freq: Four times a day (QID) | ORAL | Status: DC | PRN
Start: 2021-09-11 — End: 2021-09-19
  Administered 2021-09-17: 0.5 mg via ORAL
  Filled 2021-09-11: qty 1

## 2021-09-11 MED ORDER — HYDROCODONE-ACETAMINOPHEN 5-325 MG PO TABS: 1.0000 | ORAL_TABLET | Freq: Four times a day (QID) | ORAL | Status: DC | PRN

## 2021-09-11 MED ORDER — INSULIN ASPART 100 UNIT/ML IJ SOLN
0.0000 [IU] | INTRAMUSCULAR | Status: DC
  Administered 2021-09-12 (×2): 2 [IU] via SUBCUTANEOUS
  Administered 2021-09-12: 1 [IU] via SUBCUTANEOUS

## 2021-09-11 MED ORDER — FUROSEMIDE 10 MG/ML IJ SOLN
80.0000 mg | Freq: Every day | INTRAMUSCULAR | Status: DC
  Administered 2021-09-12: 80 mg via INTRAVENOUS
  Filled 2021-09-11: qty 8

## 2021-09-11 MED ORDER — SODIUM CHLORIDE 0.9% FLUSH
3.0000 mL | Freq: Two times a day (BID) | INTRAVENOUS | Status: DC
  Administered 2021-09-12 – 2021-09-19 (×16): 3 mL via INTRAVENOUS

## 2021-09-11 MED ORDER — ACETAMINOPHEN 650 MG RE SUPP: 650.0000 mg | Freq: Four times a day (QID) | RECTAL | Status: DC | PRN

## 2021-09-11 MED ORDER — RIVAROXABAN 15 MG PO TABS
15.0000 mg | ORAL_TABLET | Freq: Every day | ORAL | Status: DC
  Administered 2021-09-12 – 2021-09-19 (×8): 15 mg via ORAL
  Filled 2021-09-11 (×8): qty 1

## 2021-09-11 MED ORDER — DULOXETINE HCL 30 MG PO CPEP
30.0000 mg | ORAL_CAPSULE | Freq: Every morning | ORAL | Status: DC
  Administered 2021-09-12 – 2021-09-19 (×8): 30 mg via ORAL
  Filled 2021-09-11 (×8): qty 1

## 2021-09-11 MED ORDER — SODIUM CHLORIDE 0.9 % IV SOLN
250.0000 mL | INTRAVENOUS | Status: DC | PRN
  Administered 2021-09-15: 250 mL via INTRAVENOUS

## 2021-09-11 MED ORDER — FUROSEMIDE 10 MG/ML IJ SOLN
80.0000 mg | Freq: Once | INTRAMUSCULAR | Status: AC
  Administered 2021-09-11: 80 mg via INTRAVENOUS
  Filled 2021-09-11: qty 8

## 2021-09-11 MED ORDER — ACETAMINOPHEN 325 MG PO TABS: 650.0000 mg | ORAL_TABLET | Freq: Four times a day (QID) | ORAL | Status: DC | PRN

## 2021-09-11 NOTE — ED Provider Notes (Incomplete)
Emergency Department Provider Note   I have reviewed the triage vital signs and the nursing notes.   HISTORY  Chief Complaint Shortness of Breath   HPI Kristine [name] is a 76 y.o. female past medical history of A-fib on anticoagulation, CHF, CKD presents emergency department with total body swelling worse in the abdomen and lower extremities.  She states that several weeks ago her metolazone was stopped by her nephrologist with concern for worsening kidney function.  She continues with her 120 mg Lasix twice daily but after stopping her metolazone has noticed that her fluid has built up significantly.  She is now sleeping on 3 pillows and typically is able to ambulate to the bathroom without difficulty using a walker but now is extremely short of breath and lightheaded with ambulation.  She has not been sleeping over the past 3 nights.  She has not been weighing herself regularly but feels like her weight is up.  She has not had fevers or chills.  No chest tightness or belly pain.   Past Medical History:  Diagnosis Date   Allergy    Anxiety    Arthritis    Asthma    Atrial fibrillation (HCC)    Cataract    CHF (congestive heart failure) (HCC)    Chronic bronchitis (HCC)    Chronic kidney disease    Clotting disorder (HCC)    Depression    Dysrhythmia    GERD (gastroesophageal reflux disease)    High cholesterol    History of hiatal hernia    HTN (hypertension)    Mild aortic sclerosis    Morbid obesity (HCC)    Neuromuscular disorder (HCC)    Neuropathic pain    Osteoporosis    Persistent atrial fibrillation (HCC)    Scoliosis    Sleep apnea    Spinal stenosis    Thyroid disease    Type II diabetes mellitus (HCC)    Vitamin D deficiency     Review of Systems  Constitutional: No fever/chills Eyes: No visual changes. ENT: No sore throat. Cardiovascular: Denies chest pain. Positive LE edema and abdominal edema.  Respiratory: Positive shortness of  breath. Gastrointestinal: No abdominal pain.  No nausea, no vomiting.  No diarrhea.  No constipation. Genitourinary: Negative for dysuria. Musculoskeletal: Negative for back pain. Skin: Negative for rash. Neurological: Negative for headaches, focal weakness or numbness.   ____________________________________________   PHYSICAL EXAM:  VITAL SIGNS: ED Triage Vitals  Enc Vitals Group     BP 09/11/21 1721 125/76     Pulse Rate 09/11/21 1721 96     Resp 09/11/21 1721 18     Temp 09/11/21 1721 98 F (36.7 C)     Temp Source 09/11/21 1721 Oral     SpO2 09/11/21 1721 96 %   Constitutional: Alert and oriented. Well appearing and in no acute distress. Eyes: Conjunctivae are normal. Head: Atraumatic. Nose: No congestion/rhinnorhea. Mouth/Throat: Mucous membranes are moist.   Neck: No stridor.   Cardiovascular: Normal rate, regular rhythm. Good peripheral circulation. Grossly normal heart sounds.   Respiratory: Normal respiratory effort.  No retractions. Lungs CTAB. Gastrointestinal: Soft and nontender. No distention.  Musculoskeletal: No lower extremity tenderness with 3+ pitting edema in the LEs and abdominal wall. No gross deformities of extremities. Neurologic:  Normal speech and language. No gross focal neurologic deficits are appreciated.  Skin:  Skin is warm, dry and intact. No rash noted.  ____________________________________________   LABS (all labs ordered are listed, but  only abnormal results are displayed)  Labs Reviewed  BASIC METABOLIC PANEL - Abnormal; Notable for the following components:      Result Value   Potassium 3.0 (*)    Chloride 97 (*)    Glucose, Bld 183 (*)    BUN 51 (*)    Creatinine, Ser 2.49 (*)    GFR, Estimated 20 (*)    All other components within normal limits  MAGNESIUM - Abnormal; Notable for the following components:   Magnesium 2.7 (*)    All other components within normal limits  BRAIN NATRIURETIC PEPTIDE - Abnormal; Notable for the  following components:   B Natriuretic Peptide 368.7 (*)    All other components within normal limits  CBC WITH DIFFERENTIAL/PLATELET - Abnormal; Notable for the following components:   RDW 19.1 (*)    Abs Immature Granulocytes 0.12 (*)    All other components within normal limits  TROPONIN I (HIGH SENSITIVITY) - Abnormal; Notable for the following components:   Troponin I (High Sensitivity) 51 (*)    All other components within normal limits  TROPONIN I (HIGH SENSITIVITY) - Abnormal; Notable for the following components:   Troponin I (High Sensitivity) 51 (*)    All other components within normal limits   ____________________________________________  EKG   EKG Interpretation  Date/Time:  Thursday September 11 2021 17:21:30 EDT Ventricular Rate:  94 PR Interval:    QRS Duration: 108 QT Interval:  366 QTC Calculation: 457 R Axis:   203 Text Interpretation: Atrial fibrillation with premature ventricular or aberrantly conducted complexes Right superior axis deviation Possible Right ventricular hypertrophy Nonspecific ST and T wave abnormality Abnormal ECG When compared with ECG of 18-Nov-2020 15:07, PREVIOUS ECG IS PRESENT Similar to prior Confirmed by Nanda Quinton [phone]) on 09/11/2021 10:01:45 PM        ____________________________________________  RADIOLOGY  DG Chest 2 View  Result Date: 09/11/2021 CLINICAL DATA:  Shortness of breath. EXAM: CHEST - 2 VIEW COMPARISON:  February 02, 2021 FINDINGS: Mild to moderate severity enlargement of the cardiac silhouette is seen. This is stable in appearance. Mild areas of atelectasis are seen within the mid left lung and bilateral lung bases. Mild perihilar pulmonary vascular prominence is also noted. There is no evidence of a pleural effusion or pneumothorax. No acute osseous abnormalities are seen. IMPRESSION: 1. Mild to moderate severity cardiomegaly with mild pulmonary vascular congestion. 2. Mild bilateral atelectasis. Electronically Signed    By: Virgina Norfolk M.D.   On: 09/11/2021 18:06    ____________________________________________   PROCEDURES  Procedure(s) performed:   Procedures   ____________________________________________   INITIAL IMPRESSION / ASSESSMENT AND PLAN / ED COURSE  Pertinent labs & imaging results that were available during my care of the patient were reviewed by me and considered in my medical decision making (see chart for details).   This patient is Presenting for Evaluation of SOB, which does require a range of treatment options, and is a complaint that involves a high risk of morbidity and mortality.  The Differential Diagnoses include AKI, CHF exacerbation, PE, ACS, CAP, viral process.   Critical Interventions-    Medications  potassium chloride SA (KLOR-CON M) CR tablet 40 mEq (has no administration in time range)  furosemide (LASIX) injection 80 mg (has no administration in time range)    Reassessment after intervention: Starting to diurese.    I did obtain Additional Historical Information from husband at bedside.  I decided to review pertinent External Data, and in summary ***  Clinical Laboratory Tests Ordered, included patient's creatinine at 2.49 near baseline.  Potassium 3.0 mild troponin elevation at 51.  BNP is 368.  No anemia.  Radiologic Tests Ordered, included ***. I independently interpreted the images and agree with radiology interpretation.   Cardiac Monitor Tracing which shows ***   Social Determinants of Health Risk ***  Consult complete with  Medical Decision Making: Summary: ***  Reevaluation with update and discussion with   ***Considered admission***  Disposition:   ____________________________________________  FINAL CLINICAL IMPRESSION(S) / ED DIAGNOSES  Final diagnoses:  None     NEW OUTPATIENT MEDICATIONS STARTED DURING THIS VISIT:  New Prescriptions   No medications on file    Note:  This document was prepared using Dragon  voice recognition software and may include unintentional dictation errors.  Nanda Quinton, MD, [facility] Emergency Medicine

## 2021-09-11 NOTE — H&P (Incomplete)
Kristine [name] NAT:[id number] DOB: [birth year] DOA: 09/11/2021     PCP: Prince Solian, MD   Outpatient Specialists:   CARDS:  Dr.Nahser NEphrology:   Dr. Joelyn Oms Pulmonary  Dr. Vaughan Browner, Dagmar Hait,    Patient arrived to ER on 09/11/21 at 1709 Referred by Attending Long, Kristine Olds, MD   Patient coming from:    home Lives  With family    Chief Complaint:   Chief Complaint  Patient presents with  . Shortness of Breath    HPI: Kristine [name] is a 76 y.o. female with medical history significant of CKD, A.fib on anticoagulation, COPD, GERD, HTN, Morbid obesity, OSA, DM2, hypothyrodism  Charcot's foot, pulmonary hypertension  Presented with   worsening edema and shortness of breath Known history of CHF complaining of shortness of breath has been taking her diuretics but recently her dose was reduced when she was visiting her nephrologist. Her metolazone was stopped few weeks ago because her kidney function was worsening.  But she continues to take on a 20 mg of Lasix twice a day she noticed that after she stopped her metolazone her fluid started to build up.  Now she is sleeping on 3 pillows usually was able to walk to the bathroom but now very short of breath and lightheaded when she tries.  She has not been able to sleep very well because of shortness of breath.  She noticed that her weight is gone up no fevers no chills no chest pain   Reports she had a mild CP this week but non since She uses a walker She reports sensation of blood in her mouth every morning She denies coughing up blood     Lab Results  Component Value Date   SARSCOV2NAA NEGATIVE 02/02/2021   [place] NEGATIVE 02/20/2019   [place] NEGATIVE 01/03/2019     Regarding pertinent Chronic problems:    ****Hyperlipidemia - *on statins {statin:315258}  Lipid Panel     Component Value Date/Time   CHOL 133 07/16/2020 1222   TRIG 156 (H) 07/16/2020 1222   HDL 32 (L) 07/16/2020 1222   CHOLHDL  4.2 07/16/2020 1222   CHOLHDL 4.9 06/04/2018 0535   VLDL 21 06/04/2018 0535   LDLCALC 74 07/16/2020 1222   LABVLDL 27 07/16/2020 1222     HTN on Toprol spironolactone   chronic CHF diastolic/systolic/ combined - last echo 07/2020 Left ventricular ejection fraction, by estimation, is 45 to 50%. consistent with right ventricular pressure and  volume overload.   2. Right ventricular systolic function is severely reduced. The right  ventricular size is moderately enlarged. There is moderately elevated  pulmonary artery systolic pressure. The estimated right ventricular  systolic pressure is 42.7 mmHg.  Lasix off of her metolazone     DM 2 -  Lab Results  Component Value Date   HGBA1C 10.2 (H) 01/07/2021   on insulin,     Hypothyroidism:  Lab Results  Component Value Date   TSH 4.720 (H) 01/07/2021   on synthroid    obesity-   BMI Readings from Last 1 Encounters:  05/16/21 38.25 kg/m      COPD -  followed by pulmonology   not  on baseline oxygen        A. Fib -  - CHA2DS2 vas score  5   current  on anticoagulation with  Xarelto,           -  Rate control:  Currently controlled with  Toprolol,          -  Rhythm control: did not tolerate flecainide had to be off of amiodarone for suspected lung toxicity   CKD stage IIIb- baseline Cr 2.6 CrCl cannot be calculated (Unknown ideal weight.).  Lab Results  Component Value Date   CREATININE 2.49 (H) 09/11/2021   CREATININE 2.81 (H) 05/21/2021   CREATININE 2.59 (H) 03/24/2021     While in ER:   Chest x-ray worrisome for CHF Noted to have 3+ pitting edema potassium down to 3.0 troponin elevated 51 but stable   Ordered     CXR -IMPRESSION: 1. Mild to moderate severity cardiomegaly with mild pulmonary vascular congestion. 2. Mild bilateral atelectasis    Following Medications were ordered in ER: Medications  potassium chloride SA (KLOR-CON M) CR tablet 40 mEq (has no administration in time range)  furosemide  (LASIX) injection 80 mg (has no administration in time range)     ED Triage Vitals  Enc Vitals Group     BP 09/11/21 1721 125/76     Pulse Rate 09/11/21 1721 96     Resp 09/11/21 1721 18     Temp 09/11/21 1721 98 F (36.7 C)     Temp Source 09/11/21 1721 Oral     SpO2 09/11/21 1721 96 %     Weight --      Height --      Head Circumference --      Peak Flow --      Pain Score 09/11/21 1719 0     Pain Loc --      Pain Edu? --      Excl. in [place]? --   TMAX(24)@     _________________________________________ Significant initial  Findings: Abnormal Labs Reviewed  BASIC METABOLIC PANEL - Abnormal; Notable for the following components:      Result Value   Potassium 3.0 (*)    Chloride 97 (*)    Glucose, Bld 183 (*)    BUN 51 (*)    Creatinine, Ser 2.49 (*)    GFR, Estimated 20 (*)    All other components within normal limits  MAGNESIUM - Abnormal; Notable for the following components:   Magnesium 2.7 (*)    All other components within normal limits  BRAIN NATRIURETIC PEPTIDE - Abnormal; Notable for the following components:   B Natriuretic Peptide 368.7 (*)    All other components within normal limits  CBC WITH DIFFERENTIAL/PLATELET - Abnormal; Notable for the following components:   RDW 19.1 (*)    Abs Immature Granulocytes 0.12 (*)    All other components within normal limits  TROPONIN I (HIGH SENSITIVITY) - Abnormal; Notable for the following components:   Troponin I (High Sensitivity) 51 (*)    All other components within normal limits  TROPONIN I (HIGH SENSITIVITY) - Abnormal; Notable for the following components:   Troponin I (High Sensitivity) 51 (*)    All other components within normal limits     _________________________ Troponin 51 - 51 ECG: Ordered Personally reviewed and interpreted by me showing: HR : 94 Rhythm: Atrial fibrillation with premature ventricular or aberrantly conducted complexes Right superior axis deviation Possible Right ventricular  hypertrophy Nonspecific ST and T wave abnormality Abnormal ECG QTC 457    The recent clinical data is shown below. Vitals:   09/11/21 2122 09/11/21 2123 09/11/21 2124 09/11/21 2145  BP: (!) 129/95  (!) 129/95 127/63  Pulse:  100 94 87  Resp: (!) '22 17 17 13  '$ Temp:   (!) 97.5 F (36.4 C)  TempSrc:      SpO2:  93% 99% 98%    WBC     Component Value Date/Time   WBC 10.4 09/11/2021 1730   LYMPHSABS 1.9 09/11/2021 1730   LYMPHSABS 1.9 01/07/2021 1028   MONOABS 0.7 09/11/2021 1730   EOSABS 0.3 09/11/2021 1730   EOSABS 0.2 01/07/2021 1028   BASOSABS 0.1 09/11/2021 1730   BASOSABS 0.1 01/07/2021 1028           UA  no evidence of UTI     Urine analysis:    Component Value Date/Time   COLORURINE YELLOW 09/11/2021 2250   APPEARANCEUR CLEAR 09/11/2021 2250   LABSPEC 1.005 09/11/2021 2250   PHURINE 6.0 09/11/2021 2250   GLUCOSEU NEGATIVE 09/11/2021 2250   HGBUR NEGATIVE 09/11/2021 2250   BILIRUBINUR NEGATIVE 09/11/2021 2250   KETONESUR NEGATIVE 09/11/2021 2250   PROTEINUR NEGATIVE 09/11/2021 2250   UROBILINOGEN 1.0 11/10/2013 2324   NITRITE NEGATIVE 09/11/2021 2250   LEUKOCYTESUR NEGATIVE 09/11/2021 2250     _______________________________________________ Hospitalist was called for admission for acute on chronic systolic diastolic chf   The following Work up has been ordered so far:  Orders Placed This Encounter  Procedures  . DG Chest 2 View  . Basic metabolic panel  . Magnesium  . Brain natriuretic peptide (order ONLY if patient c/o SOB)  . CBC with Differential/Platelet  . Cardiac monitoring  . Height and weight  . Strict intake and output  . Initiate Carrier Fluid Protocol  . If O2 sat If O2 sat < 94% administer O2 at 2 liters/minute via nasal canula  . Nursing Communication Weigh patient please  . Consult to hospitalist  . Pulse oximetry, continuous  . ED EKG  . EKG 12-Lead  . Insert peripheral IV     OTHER Significant initial  Findings:  labs  showing:    Recent Labs  Lab 09/11/21 1730  NA 137  K 3.0*  CO2 28  GLUCOSE 183*  BUN 51*  CREATININE 2.49*  CALCIUM 9.8  MG 2.7*    Cr   stable,    Lab Results  Component Value Date   CREATININE 2.49 (H) 09/11/2021   CREATININE 2.81 (H) 05/21/2021   CREATININE 2.59 (H) 03/24/2021    No results for input(s): "AST", "ALT", "ALKPHOS", "BILITOT", "PROT", "ALBUMIN" in the last 168 hours. Lab Results  Component Value Date   CALCIUM 9.8 09/11/2021   PHOS 3.6 06/08/2018    Plt: Lab Results  Component Value Date   PLT 166 09/11/2021     COVID-19 Labs  No results for input(s): "DDIMER", "FERRITIN", "LDH", "CRP" in the last 72 hours.  Lab Results  Component Value Date   SARSCOV2NAA NEGATIVE 02/02/2021   SARSCOV2NAA NEGATIVE 02/20/2019   [place] NEGATIVE 01/03/2019        Recent Labs  Lab 09/11/21 1730  WBC 10.4  NEUTROABS 7.4  HGB 12.7  HCT 40.1  MCV 93.5  PLT 166    HG/HCT  stable,      Component Value Date/Time   HGB 12.7 09/11/2021 1730   HGB 10.6 (L) 01/07/2021 1028   HCT 40.1 09/11/2021 1730   HCT 34.8 01/07/2021 1028   MCV 93.5 09/11/2021 1730   MCV 86 01/07/2021 1028       BNP (last 3 results) Recent Labs    09/11/21 1730  BNP 368.7*     DM  labs:  HbA1C: Recent Labs    01/07/21 1028  HGBA1C 10.2*  CBG (last 3)  No results for input(s): "GLUCAP" in the last 72 hours.        Cultures:    Component Value Date/Time   SDES URINE, CATHETERIZED 06/03/2018 1253   SPECREQUEST  06/03/2018 1253    NONE Performed at [facility] [address], [place], [place] [address]    CULT >=100,000 COLONIES/mL ESCHERICHIA COLI (A) 06/03/2018 1253   REPTSTATUS 06/05/2018 FINAL 06/03/2018 1253     Radiological Exams on Admission: DG Chest 2 View  Result Date: 09/11/2021 CLINICAL DATA:  Shortness of breath. EXAM: CHEST - 2 VIEW COMPARISON:  February 02, 2021 FINDINGS: Mild to moderate severity enlargement of the cardiac  silhouette is seen. This is stable in appearance. Mild areas of atelectasis are seen within the mid left lung and bilateral lung bases. Mild perihilar pulmonary vascular prominence is also noted. There is no evidence of a pleural effusion or pneumothorax. No acute osseous abnormalities are seen. IMPRESSION: 1. Mild to moderate severity cardiomegaly with mild pulmonary vascular congestion. 2. Mild bilateral atelectasis. Electronically Signed   By: Virgina Norfolk M.D.   On: 09/11/2021 18:06   _______________________________________________________________________________________________________ Latest  Blood pressure 127/63, pulse 87, temperature (!) 97.5 F (36.4 C), resp. rate 13, SpO2 98 %.   Vitals  labs and radiology finding personally reviewed  Review of Systems:    Pertinent positives include:  dyspnea on exertion,   shortness of breath at rest.Bilateral lower extremity swelling  Constitutional:  No weight loss, night sweats, Fevers, chills, fatigue, weight loss  HEENT:  No headaches, Difficulty swallowing,Tooth/dental problems,Sore throat,  No sneezing, itching, ear ache, nasal congestion, post nasal drip,  Cardio-vascular:  No chest pain, Orthopnea, PND, anasarca, dizziness, palpitations.no  GI:  No heartburn, indigestion, abdominal pain, nausea, vomiting, diarrhea, change in bowel habits, loss of appetite, melena, blood in stool, hematemesis Resp:  no  No No excess mucus, no productive cough, No non-productive cough, No coughing up of blood.No change in color of mucus.No wheezing. Skin:  no rash or lesions. No jaundice GU:  no dysuria, change in color of urine, no urgency or frequency. No straining to urinate.  No flank pain.  Musculoskeletal:  No joint pain or no joint swelling. No decreased range of motion. No back pain.  Psych:  No change in mood or affect. No depression or anxiety. No memory loss.  Neuro: no localizing neurological complaints, no tingling, no weakness, no  double vision, no gait abnormality, no slurred speech, no confusion  All systems reviewed and apart from [place] all are negative _______________________________________________________________________________________________ Past Medical History:   Past Medical History:  Diagnosis Date  . Allergy   . Anxiety   . Arthritis   . Asthma   . Atrial fibrillation ([place])   . Cataract   . CHF (congestive heart failure) ([place])   . Chronic bronchitis ([place])   . Chronic kidney disease   . Clotting disorder ([place])   . Depression   . Dysrhythmia   . GERD (gastroesophageal reflux disease)   . High cholesterol   . History of hiatal hernia   . HTN (hypertension)   . Mild aortic sclerosis   . Morbid obesity ([place])   . Neuromuscular disorder ([place])   . Neuropathic pain   . Osteoporosis   . Persistent atrial fibrillation ([place])   . Scoliosis   . Sleep apnea   . Spinal stenosis   . Thyroid disease   . Type II diabetes mellitus ([place])   . Vitamin D  deficiency      Past Surgical History:  Procedure Laterality Date  . BLADDER SUSPENSION  1980s  . CARDIAC CATHETERIZATION  11/16/09   SMOOTH AND NORMAL  . CARDIOVERSION N/A 01/07/2015   Procedure: CARDIOVERSION;  Surgeon: Skeet Latch, MD;  Location: [facility] ENDOSCOPY;  Service: Cardiovascular;  Laterality: N/A;  . CARDIOVERSION N/A 03/11/2015   Procedure: CARDIOVERSION;  Surgeon: Skeet Latch, MD;  Location: [facility];  Service: Cardiovascular;  Laterality: N/A;  . CARPAL TUNNEL RELEASE Left 1970s  . CATARACT EXTRACTION W/ INTRAOCULAR LENS  IMPLANT, BILATERAL Bilateral ~ 2010  . COLONOSCOPY  08/14/08  . FINGER FRACTURE SURGERY Right 1970s   "ring finger"  . FRACTURE SURGERY    . HEMATOMA EVACUATION Left 05/21/2021   Procedure: EVACUATION LEFT KNEE HEMATOMA;  Surgeon: Lennice Sites, MD;  Location: [facility];  Service: Plastics;  Laterality: Left;  . LAPAROSCOPIC CHOLECYSTECTOMY  1980s  . TUBAL LIGATION    . VAGINAL HYSTERECTOMY  1980    Social  History:  Ambulatory   walker  wheelchair bound     reports that she has never smoked. She has never used smokeless tobacco. She reports that she does not drink alcohol and does not use drugs.    Family History:   Family History  Problem Relation Age of Onset  . Stroke Father   . Hypertension Father   . Atrial fibrillation Father        HAD MURMUR  . Heart failure Mother   . Congestive Heart Failure Mother   . Hypertension Mother   . Hypertension Brother   . Hypertension Sister   . Hypertension Son   . CAD Sister        EARLY  . CAD Brother        EARLY  . Colon cancer Neg Hx   . Esophageal cancer Neg Hx   . Stomach cancer Neg Hx   . Rectal cancer Neg Hx    ______________________________________________________________________________________________ Allergies: Allergies  Allergen Reactions  . Albuterol Other (See Comments)    REACTION: Tachycardia- AFib  . Epinephrine Other (See Comments)    Increases heart rate per patient.   . Penicillins Other (See Comments)    REACTION: flushing' \\T'$ \ hot  Did it involve swelling of the face/tongue/throat, SOB, or low BP? No Did it involve sudden or severe rash/hives, skin peeling, or any reaction on the inside of your mouth or nose? No Did you need to seek medical attention at a hospital or doctor's office? No When did it last happen?    20 years ago   If all above answers are "NO", may proceed with cephalosporin use.  . Ceftriaxone Other (See Comments)    Sweats  . Citalopram Hydrobromide Other (See Comments)    Prolonged QT prolongation  . Exenatide Other (See Comments)    Unknown reaction  . Ketorolac Tromethamine Hives and Swelling    swelling in hands  . Tizanidine Other (See Comments)    lethargic  . Torsemide Swelling  . Zoledronic Acid Other (See Comments)    Pt was hospitalized due to medication  . Bee Venom Other (See Comments)    "makes me nervous"  . Ivp Dye [Iodinated Contrast Media] Other (See Comments)     flushing  . Keflex [Cephalexin] Other (See Comments)    Makes her feel warm; she was told never to take it.     Prior to Admission medications   Medication Sig Start Date End Date Taking? Authorizing Provider  allopurinol (  ZYLOPRIM) 300 MG tablet Take 300 mg by mouth in the morning.   Yes [provider]  ALPRAZolam Duanne Moron) 0.5 MG tablet Take 0.5 mg by mouth every 6 (six) hours as needed for anxiety.   Yes [provider]  colchicine 0.6 MG tablet Take 0.6 mg by mouth daily as needed (gout flares). 04/07/21  Yes [provider]  diclofenac Sodium (VOLTAREN) 1 % GEL 4 gram qid pn Patient taking differently: Apply 4 g topically 4 (four) times daily as needed (back pain). 01/07/21  Yes Marcial Pacas, MD  docusate sodium (COLACE) 100 MG capsule Take 100 mg by mouth daily as needed for mild constipation.   Yes [provider]  DULoxetine (CYMBALTA) 30 MG capsule Take 30 mg by mouth in the morning. 09/26/20  Yes [provider]  estradiol (ESTRACE) 0.1 MG/GM vaginal cream Place 1 Applicatorful vaginally as needed (vaginal irritation). 12/18/16  Yes [provider]  furosemide (LASIX) 80 MG tablet take ONE AND ONE-HALF TABLET BY MOUTH TWICE DAILY Patient taking differently: Take 120 mg by mouth 2 (two) times daily. 08/28/21  Yes Nahser, Kristine Cheng, MD  insulin aspart (NOVOLOG FLEXPEN) 100 UNIT/ML FlexPen 40-70 Units See admin instructions. Less than 150 units 150=40 units151-199=45 units 200-249=50 units 250-299=55 units 300-349=60 units   Yes [provider]  Insulin Degludec (TRESIBA FLEXTOUCH) 200 UNIT/ML SOPN Inject 100 Units into the skin in the morning and at bedtime.   Yes [provider]  levothyroxine (SYNTHROID) 75 MCG tablet Take 75 mcg by mouth every morning. 06/20/19  Yes [provider]  lidocaine-prilocaine (EMLA) cream 1 gram qid prn Patient taking differently: Apply 1 Application topically 4 (four) times daily as  needed (foot pain). 01/07/21  Yes Marcial Pacas, MD  LYRICA 50 MG capsule Take 50 mg by mouth 2 (two) times daily. 06/01/17  Yes [provider]  metoprolol succinate (TOPROL-XL) 50 MG 24 hr tablet TAKE ONE TABLET BY MOUTH TWICE DAILY take with or immediately following a meal Patient taking differently: Take 50 mg by mouth in the morning and at bedtime. 09/26/20  Yes Nahser, Kristine Cheng, MD  nystatin (MYCOSTATIN/NYSTOP) powder Apply 1 application topically as needed (skin irritation/rash). 01/13/19  Yes [provider]  ondansetron (ZOFRAN) 4 MG tablet Take 1 tablet (4 mg total) by mouth every 8 (eight) hours as needed for nausea or vomiting. 05/21/21  Yes Scheeler, Carola Rhine, PA-C  polyethylene glycol (MIRALAX / GLYCOLAX) 17 g packet Take 17 g by mouth daily as needed (constipation.).   Yes [provider]  spironolactone (ALDACTONE) 25 MG tablet Take 1 tablet (25 mg total) by mouth daily. 07/17/20  Yes Nahser, Kristine Cheng, MD  Vitamin D, Ergocalciferol, (DRISDOL) 50000 UNITS CAPS Take 50,000 Units by mouth every Monday.   Yes [provider]  XARELTO 15 MG TABS tablet Take 15 mg by mouth in the morning. 06/27/16  Yes [provider]  BD PEN NEEDLE NANO U/F 32G X 4 MM MISC Inject 1 each as directed See admin instructions. Use pen needles with insulin pens daily 12/24/14   [provider]  HYDROcodone-acetaminophen (NORCO/VICODIN) 5-325 MG tablet Take 1 tablet by mouth every 6 (six) hours as needed for severe pain. 04/09/21   Etta Quill, NP  L-Methylfolate-Algae-B12-B6 [facility]) 3-90.314-2-35 MG CAPS Take 1 tablet by mouth 2 (two) times daily. Patient not taking: Reported on 09/11/2021 01/07/21   Marcial Pacas, MD  levothyroxine (SYNTHROID) 88 MCG tablet Take 88 mcg by mouth every morning.  08/14/21   [provider]  metolazone (ZAROXOLYN) 2.5 MG tablet Take 1 tablet (2.5 mg total) by mouth 3 (three) times a week. Mon, Wed, Fri Patient not taking: Reported on  09/11/2021 08/16/19   Nahser, Kristine Cheng, MD  oxyCODONE-acetaminophen (PERCOCET/ROXICET) 5-325 MG tablet Take 1 tablet by mouth every 6 (six) hours as needed for severe pain. 03/24/21   Lajean Saver, MD  traMADol (ULTRAM) 50 MG tablet Take 50 mg by mouth 3 (three) times daily as needed (severe neuropathy pain.). 06/24/19   [provider]    ___________________________________________________________________________________________________ Physical Exam:    09/11/2021    9:45 PM 09/11/2021    9:24 PM 09/11/2021    9:23 PM  Vitals with BMI  Systolic 277 412   Diastolic 63 95   Pulse 87 94 100     1. General:  in No ***Acute distress***increased work of breathing ***complaining of severe pain****agitated * Chronically ill *well *cachectic *toxic acutely ill -appearing 2. Psychological: Alert and *** Oriented 3. Head/ENT:   Moist *** Dry Mucous Membranes                          Head Non traumatic, neck supple                          Normal *** Poor Dentition 4. SKIN: normal *** decreased Skin turgor,  Skin clean Dry and intact no rash 5. Heart: Regular rate and rhythm no*** Murmur, no Rub or gallop 6. Lungs: ***Clear to auscultation bilaterally, no wheezes or crackles   7. Abdomen: Soft, ***non-tender, Non distended *** obese ***bowel sounds present 8. Lower extremities: no clubbing, cyanosis, no ***edema 9. Neurologically Grossly intact, moving all 4 extremities equally *** strength 5 out of 5 in all 4 extremities cranial nerves II through XII intact 10. MSK: Normal range of motion    Chart has been reviewed  ______________________________________________________________________________________________  Assessment/Plan  ***  Admitted for *** Acute pulmonary edema (HCC) ***    Present on Admission: **None**     No problem-specific Assessment & Plan notes found for this encounter.    Other plan as per orders.  DVT prophylaxis:  SCD *** Lovenox       Code  Status:    Code Status: Prior FULL CODE *** DNR/DNI ***comfort care as per patient ***family  I had personally discussed CODE STATUS with patient and family* I had spent *min discussing goals of care and CODE STATUS    Family Communication:   Family not at  Bedside  plan of care was discussed on the phone with *** Son, Daughter, Wife, Husband, Sister, Brother , father, mother  Disposition Plan:   *** likely will need placement for rehabilitation                          Back to current facility when stable                            To home once workup is complete and patient is stable  ***Following barriers for discharge:                            Electrolytes corrected  Anemia corrected                             Pain controlled with PO medications                               Afebrile, white count improving able to transition to PO antibiotics                             Will need to be able to tolerate PO                            Will likely need home health, home O2, set up                           Will need consultants to evaluate patient prior to discharge  ****EXPECT DC tomorrow                    ***Would benefit from PT/OT eval prior to DC  Ordered                   Swallow eval - SLP ordered                   Diabetes care coordinator                   Transition of care consulted                   Nutrition    consulted                  Wound care  consulted                   Palliative care    consulted                   Behavioral health  consulted                    Consults called: ***    Admission status:  ED Disposition     ED Disposition  Admit   Condition  --   Comment  The patient appears reasonably stabilized for admission considering the current resources, flow, and capabilities available in the ED at this time, and I doubt any other [facility] requiring further screening and/or treatment in the ED prior to admission is   present.           Obs***  ***  inpatient     I Expect 2 midnight stay secondary to severity of patient's current illness need for inpatient interventions justified by the following: ***hemodynamic instability despite optimal treatment (tachycardia *hypotension * tachypnea *hypoxia, hypercapnia) * Severe lab/radiological/exam abnormalities including:     and extensive comorbidities including: *substance abuse  *Chronic pain *DM2  * CHF * CAD  * COPD/asthma *Morbid Obesity * CKD *dementia *liver disease *history of stroke with residual deficits *  malignancy, * sickle cell disease  History of amputation Chronic anticoagulation  That are currently affecting medical management.   I expect  patient to be hospitalized for 2 midnights requiring inpatient medical care.  Patient is at high risk for adverse outcome (such as loss of life  or disability) if not treated.  Indication for inpatient stay as follows:  Severe change from baseline regarding mental status Hemodynamic instability despite maximal medical therapy,  ongoing suicidal ideations,  severe pain requiring acute inpatient management,  inability to maintain oral hydration   persistent chest pain despite medical management Need for operative/procedural  intervention New or worsening hypoxia   Need for IV antibiotics, IV fluids, IV rate controling medications, IV antihypertensives, IV pain medications, IV anticoagulation, need for biPAP    Level of care   *** tele  For 12H 24H     medical floor       progressive tele indefinitely please discontinue once patient no longer qualifies COVID-19 Labs    Lab Results  Component Value Date   [place] NEGATIVE 02/02/2021     Precautions: admitted as *** Covid Negative  ***asymptomatic screening protocol****PUI *** covid positive No active isolations ***If Covid PCR is negative  - please DC precautions - would need additional investigation given very high  risk for false native test result    Critical***  Patient is critically ill due to  hemodynamic instability * respiratory failure *severe sepsis* ongoing chest pain*  They are at high risk for life/limb threatening clinical deterioration requiring frequent reassessment and modifications of care.  Services provided include examination of the patient, review of relevant ancillary tests, prescription of lifesaving therapies, review of medications and prophylactic therapy.  Total critical care time excluding separately billable procedures: 60*  Minutes.    Daviana Haymaker 09/11/2021, 10:26 PM ***  [facility]     after 2 AM please page floor coverage PA If 7AM-7PM, please contact the day team taking care of the patient using [email or web address]   Patient was evaluated in the context of the global COVID-19 pandemic, which necessitated consideration that the patient might be at risk for infection with the SARS-CoV-2 virus that causes COVID-19. Institutional protocols and algorithms that pertain to the evaluation of patients at risk for COVID-19 are in a state of rapid change based on information released by regulatory bodies including the CDC and federal and state organizations. These policies and algorithms were followed during the patient's care.

## 2021-09-11 NOTE — Assessment & Plan Note (Signed)
With chronic benefit from cardiology consult repeat echogram oxygen as needed

## 2021-09-11 NOTE — ED Triage Notes (Signed)
Patient with history of CHF here with complaint of shortness of breath. Patient has been taking her diuretic but states last time she was at her nephrologist the dose was reduced to protect her kidney function. Patient is alert, oriented, speaking in complete sentences, and in no apparent distress at this time.

## 2021-09-11 NOTE — Assessment & Plan Note (Signed)
-  chronic avoid nephrotoxic medications such as NSAIDs, Vanco Zosyn combo,  avoid hypotension, continue to follow renal function  

## 2021-09-11 NOTE — Assessment & Plan Note (Signed)
Chronic stable continue Cymbalta 30 mg a day

## 2021-09-11 NOTE — Assessment & Plan Note (Signed)
Continue home dose of Xanax 0.5 mg every 6 hours as needed

## 2021-09-11 NOTE — Assessment & Plan Note (Addendum)
Check lipid panel  

## 2021-09-11 NOTE — Assessment & Plan Note (Signed)
Replaced in ED repeat BMET

## 2021-09-11 NOTE — Assessment & Plan Note (Addendum)
Continue Lopressor 25 mg at bedtime.and spironolactone 25 mg at bedtime If BP remains stable resume Toprol tomorrow

## 2021-09-11 NOTE — ED Provider Triage Note (Signed)
Emergency Medicine Provider Triage Evaluation Note  Kristine [name] , a 76 y.o. female  was evaluated in triage.  Patient has a history of chronic kidney disease and congestive heart failure.  She complains of worsening swelling around her abdomen and legs .  She also complains of shortness of breath and orthopnea over the past 3 days.  She states that she is on a fluid pill, but dose was lowered after recent visit to her kidney doctor.  Review of Systems  Positive: Shortness of breath, lower extremity edema Negative: Fevers, CP  Physical Exam  BP 125/76 (BP Location: Right Arm)   Pulse 96   Temp 98 F (36.7 C) (Oral)   Resp 18   SpO2 96%  Gen:   Awake, no distress   Resp:  Normal effort  MSK:   Moves extremities without difficulty  Other:  Abdominal edema, 1-2+ pitting edema lower extremities bilaterally, symmetric  Medical Decision Making  Medically screening exam initiated at 5:21 PM.  Appropriate orders placed.  Kristine [name] was informed that the remainder of the evaluation will be completed by another provider, this initial triage assessment does not replace that evaluation, and the importance of remaining in the ED until their evaluation is complete.     Carlisle Cater, PA-C 09/11/21 1722

## 2021-09-11 NOTE — Assessment & Plan Note (Signed)
-   Pt diagnosed with CHF based on presence of the following:  PND, OA, rales on exam,  Pulmonary edema on CXR, and  bilateral leg edema, DOE, With noted response to IV diuretic in ER  admit on telemetry,  cycle cardiac enzymes, Troponin 51   obtain serial ECG  to evaluate for ischemia as a cause of heart failure  monitor daily weight:  Filed Weights   09/11/21 2239  Weight: 135.5 kg   Last BNP BNP (last 3 results) Recent Labs    09/11/21 1730  BNP 368.7*       diurese with IV lasix and monitor orthostatics and creatinine to avoid over diuresis.  Order echogram to evaluate EF and valves  ACE/ARB  Contraindicated    cardiology emailed

## 2021-09-11 NOTE — Assessment & Plan Note (Signed)
Order sliding scale Continue insulin changed to Lantus for now continue at 100 units nightly Order diabetic coordinator consult

## 2021-09-11 NOTE — H&P (Signed)
Kristine [name] BJY:[id number] DOB: Jul 12, 1945 DOA: 09/11/2021     PCP: Chilton Greathouse, MD   Outpatient Specialists:   CARDS:  Dr.Nahser NEphrology:   Dr. Marisue Humble Pulmonary  Dr. Isaiah Serge, Felipa Eth,    Patient arrived to ER on 09/11/21 at 1709 Referred by Attending Long, Arlyss Repress, MD   Patient coming from:    home Lives  With family    Chief Complaint:   Chief Complaint  Patient presents with   Shortness of Breath    HPI: Kristine [name] is a  76 y.o. female with medical history significant of CKD, A.fib on anticoagulation, COPD, GERD, HTN, Morbid obesity, OSA, DM2, hypothyrodism  Charcot's foot, pulmonary hypertension  Presented with   worsening edema and shortness of breath Known history of CHF complaining of shortness of breath has been taking her diuretics but recently her dose was reduced when she was visiting her nephrologist. Her metolazone was stopped few weeks ago because her kidney function was worsening.  But she continues to take on a 20 mg of Lasix twice a day she noticed that after she stopped her metolazone her fluid started to build up.  Now she is sleeping on 3 pillows usually was able to walk to the bathroom but now very short of breath and lightheaded when she tries.  She has not been able to sleep very well because of shortness of breath.  She noticed that her weight is gone up no fevers no chills no chest pain   Reports she had a mild CP this week but non since She uses a walker She reports sensation of blood in her mouth every morning She denies coughing up blood     Lab Results  Component Value Date   SARSCOV2NAA NEGATIVE 02/02/2021   SARSCOV2NAA NEGATIVE 02/20/2019   SARSCOV2NAA NEGATIVE 01/03/2019     Regarding pertinent Chronic problems:    Hyperlipidemia - not on statin Lipid Panel     Component Value Date/Time   CHOL 133 07/16/2020 1222   TRIG 156 (H) 07/16/2020 1222   HDL 32 (L) 07/16/2020 1222   CHOLHDL 4.2 07/16/2020 1222    CHOLHDL 4.9 06/04/2018 0535   VLDL 21 06/04/2018 0535   LDLCALC 74 07/16/2020 1222   LABVLDL 27 07/16/2020 1222     HTN on Toprol spironolactone   chronic CHF diastolic/systolic/ combined - last echo 07/2020 Left ventricular ejection fraction, by estimation, is 45 to 50%. consistent with right ventricular pressure and  volume overload.   2. Right ventricular systolic function is severely reduced. The right  ventricular size is moderately enlarged. There is moderately elevated  pulmonary artery systolic pressure. The estimated right ventricular  systolic pressure is 54.7 mmHg.  Lasix off of her metolazone     DM 2 -  Lab Results  Component Value Date   HGBA1C 10.2 (H) 01/07/2021   on insulin,     Hypothyroidism:  Lab Results  Component Value Date   TSH 4.720 (H) 01/07/2021   on synthroid    obesity-   BMI Readings from Last 1 Encounters:  05/16/21 38.25 kg/m      COPD -  followed by pulmonology   not  on baseline oxygen        A. Fib -  - CHA2DS2 vas score  5   current  on anticoagulation with  Xarelto,           -  Rate control:  Currently controlled with  Toprolol,          -  Rhythm control: did not tolerate flecainide had to be off of amiodarone for suspected lung toxicity   CKD stage IIIb- baseline Cr 2.6 CrCl cannot be calculated (Unknown ideal weight.).  Lab Results  Component Value Date   CREATININE 2.49 (H) 09/11/2021   CREATININE 2.81 (H) 05/21/2021   CREATININE 2.59 (H) 03/24/2021     While in ER:   Chest x-ray worrisome for CHF Noted to have 3+ pitting edema potassium down to 3.0 troponin elevated 51 but stable   Ordered     CXR -IMPRESSION: 1. Mild to moderate severity cardiomegaly with mild pulmonary vascular congestion. 2. Mild bilateral atelectasis    Following Medications were ordered in ER: Medications  potassium chloride SA (KLOR-CON M) CR tablet 40 mEq (has no administration in time range)  furosemide (LASIX) injection 80 mg  (has no administration in time range)     ED Triage Vitals  Enc Vitals Group     BP 09/11/21 1721 125/76     Pulse Rate 09/11/21 1721 96     Resp 09/11/21 1721 18     Temp 09/11/21 1721 98 F (36.7 C)     Temp Source 09/11/21 1721 Oral     SpO2 09/11/21 1721 96 %     Weight --      Height --      Head Circumference --      Peak Flow --      Pain Score 09/11/21 1719 0     Pain Loc --      Pain Edu? --      Excl. in GC? --   TMAX(24)@     _________________________________________ Significant initial  Findings: Abnormal Labs Reviewed  BASIC METABOLIC PANEL - Abnormal; Notable for the following components:      Result Value   Potassium 3.0 (*)    Chloride 97 (*)    Glucose, Bld 183 (*)    BUN 51 (*)    Creatinine, Ser 2.49 (*)    GFR, Estimated 20 (*)    All other components within normal limits  MAGNESIUM - Abnormal; Notable for the following components:   Magnesium 2.7 (*)    All other components within normal limits  BRAIN NATRIURETIC PEPTIDE - Abnormal; Notable for the following components:   B Natriuretic Peptide 368.7 (*)    All other components within normal limits  CBC WITH DIFFERENTIAL/PLATELET - Abnormal; Notable for the following components:   RDW 19.1 (*)    Abs Immature Granulocytes 0.12 (*)    All other components within normal limits  TROPONIN I (HIGH SENSITIVITY) - Abnormal; Notable for the following components:   Troponin I (High Sensitivity) 51 (*)    All other components within normal limits  TROPONIN I (HIGH SENSITIVITY) - Abnormal; Notable for the following components:   Troponin I (High Sensitivity) 51 (*)    All other components within normal limits     _________________________ Troponin 51 - 51 ECG: Ordered Personally reviewed and interpreted by me showing: HR : 94 Rhythm: Atrial fibrillation with premature ventricular or aberrantly conducted complexes Right superior axis deviation Possible Right ventricular hypertrophy Nonspecific ST and T  wave abnormality Abnormal ECG QTC 457    The recent clinical data is shown below. Vitals:   09/11/21 2122 09/11/21 2123 09/11/21 2124 09/11/21 2145  BP: (!) 129/95  (!) 129/95 127/63  Pulse:  100 94 87  Resp: (!) 22 17 17 13   Temp:   (!) 97.5 F (36.4 C)  TempSrc:      SpO2:  93% 99% 98%    WBC     Component Value Date/Time   WBC 10.4 09/11/2021 1730   LYMPHSABS 1.9 09/11/2021 1730   LYMPHSABS 1.9 01/07/2021 1028   MONOABS 0.7 09/11/2021 1730   EOSABS 0.3 09/11/2021 1730   EOSABS 0.2 01/07/2021 1028   BASOSABS 0.1 09/11/2021 1730   BASOSABS 0.1 01/07/2021 1028      UA  no evidence of UTI     Urine analysis:    Component Value Date/Time   COLORURINE YELLOW 09/11/2021 2250   APPEARANCEUR CLEAR 09/11/2021 2250   LABSPEC 1.005 09/11/2021 2250   PHURINE 6.0 09/11/2021 2250   GLUCOSEU NEGATIVE 09/11/2021 2250   HGBUR NEGATIVE 09/11/2021 2250   BILIRUBINUR NEGATIVE 09/11/2021 2250   KETONESUR NEGATIVE 09/11/2021 2250   PROTEINUR NEGATIVE 09/11/2021 2250   UROBILINOGEN 1.0 11/10/2013 2324   NITRITE NEGATIVE 09/11/2021 2250   LEUKOCYTESUR NEGATIVE 09/11/2021 2250     _______________________________________________ Hospitalist was called for admission for acute on chronic systolic diastolic chf   The following Work up has been ordered so far:  Orders Placed This Encounter  Procedures   DG Chest 2 View   Basic metabolic panel   Magnesium   Brain natriuretic peptide (order ONLY if patient c/o SOB)   CBC with Differential/Platelet   Cardiac monitoring   Height and weight   Strict intake and output   Initiate Carrier Fluid Protocol   If O2 sat If O2 sat < 94% administer O2 at 2 liters/minute via nasal canula   Nursing Communication Weigh patient please   Consult to hospitalist   Pulse oximetry, continuous   ED EKG   EKG 12-Lead   Insert peripheral IV     OTHER Significant initial  Findings:  labs showing:    Recent Labs  Lab 09/11/21 1730  NA 137  K  3.0*  CO2 28  GLUCOSE 183*  BUN 51*  CREATININE 2.49*  CALCIUM 9.8  MG 2.7*    Cr   stable,    Lab Results  Component Value Date   CREATININE 2.49 (H) 09/11/2021   CREATININE 2.81 (H) 05/21/2021   CREATININE 2.59 (H) 03/24/2021    No results for input(s): "AST", "ALT", "ALKPHOS", "BILITOT", "PROT", "ALBUMIN" in the last 168 hours. Lab Results  Component Value Date   CALCIUM 9.8 09/11/2021   PHOS 3.6 06/08/2018    Plt: Lab Results  Component Value Date   PLT 166 09/11/2021     COVID-19 Labs  No results for input(s): "DDIMER", "FERRITIN", "LDH", "CRP" in the last 72 hours.  Lab Results  Component Value Date   SARSCOV2NAA NEGATIVE 02/02/2021   SARSCOV2NAA NEGATIVE 02/20/2019   SARSCOV2NAA NEGATIVE 01/03/2019        Recent Labs  Lab 09/11/21 1730  WBC 10.4  NEUTROABS 7.4  HGB 12.7  HCT 40.1  MCV 93.5  PLT 166    HG/HCT  stable,      Component Value Date/Time   HGB 12.7 09/11/2021 1730   HGB 10.6 (L) 01/07/2021 1028   HCT 40.1 09/11/2021 1730   HCT 34.8 01/07/2021 1028   MCV 93.5 09/11/2021 1730   MCV 86 01/07/2021 1028       BNP (last 3 results) Recent Labs    09/11/21 1730  BNP 368.7*     DM  labs:  HbA1C: Recent Labs    01/07/21 1028  HGBA1C 10.2*       CBG (last 3)  No results for input(s): "GLUCAP" in the last 72 hours.        Cultures:    Component Value Date/Time   SDES URINE, CATHETERIZED 06/03/2018 1253   SPECREQUEST  06/03/2018 1253    NONE Performed at [facility], [address] [place], [place] [address]    CULT >=100,000 COLONIES/mL ESCHERICHIA COLI (A) 06/03/2018 1253   REPTSTATUS 06/05/2018 FINAL 06/03/2018 1253     Radiological Exams on Admission: DG Chest 2 View  Result Date: 09/11/2021 CLINICAL DATA:  Shortness of breath. EXAM: CHEST - 2 VIEW COMPARISON:  February 02, 2021 FINDINGS: Mild to moderate severity enlargement of the cardiac silhouette is seen. This is stable in appearance. Mild areas of  atelectasis are seen within the mid left lung and bilateral lung bases. Mild perihilar pulmonary vascular prominence is also noted. There is no evidence of a pleural effusion or pneumothorax. No acute osseous abnormalities are seen. IMPRESSION: 1. Mild to moderate severity cardiomegaly with mild pulmonary vascular congestion. 2. Mild bilateral atelectasis. Electronically Signed   By: Aram Candela M.D.   On: 09/11/2021 18:06   _______________________________________________________________________________________________________ Latest  Blood pressure 127/63, pulse 87, temperature (!) 97.5 F (36.4 C), resp. rate 13, SpO2 98 %.   Vitals  labs and radiology finding personally reviewed  Review of Systems:    Pertinent positives include:  dyspnea on exertion,   shortness of breath at rest.Bilateral lower extremity swelling  Constitutional:  No weight loss, night sweats, Fevers, chills, fatigue, weight loss  HEENT:  No headaches, Difficulty swallowing,Tooth/dental problems,Sore throat,  No sneezing, itching, ear ache, nasal congestion, post nasal drip,  Cardio-vascular:  No chest pain, Orthopnea, PND, anasarca, dizziness, palpitations.no  GI:  No heartburn, indigestion, abdominal pain, nausea, vomiting, diarrhea, change in bowel habits, loss of appetite, melena, blood in stool, hematemesis Resp:  no  No No excess mucus, no productive cough, No non-productive cough, No coughing up of blood.No change in color of mucus.No wheezing. Skin:  no rash or lesions. No jaundice GU:  no dysuria, change in color of urine, no urgency or frequency. No straining to urinate.  No flank pain.  Musculoskeletal:  No joint pain or no joint swelling. No decreased range of motion. No back pain.  Psych:  No change in mood or affect. No depression or anxiety. No memory loss.  Neuro: no localizing neurological complaints, no tingling, no weakness, no double vision, no gait abnormality, no slurred speech, no  confusion  All systems reviewed and apart from HOPI all are negative _______________________________________________________________________________________________ Past Medical History:   Past Medical History:  Diagnosis Date   Allergy    Anxiety    Arthritis    Asthma    Atrial fibrillation (HCC)    Cataract    CHF (congestive heart failure) (HCC)    Chronic bronchitis (HCC)    Chronic kidney disease    Clotting disorder (HCC)    Depression    Dysrhythmia    GERD (gastroesophageal reflux disease)    High cholesterol    History of hiatal hernia    HTN (hypertension)    Mild aortic sclerosis    Morbid obesity (HCC)    Neuromuscular disorder (HCC)    Neuropathic pain    Osteoporosis    Persistent atrial fibrillation (HCC)    Scoliosis    Sleep apnea    Spinal stenosis    Thyroid disease    Type II diabetes mellitus (HCC)    Vitamin D deficiency  Past Surgical History:  Procedure Laterality Date   BLADDER SUSPENSION  1980s   CARDIAC CATHETERIZATION  11/16/09   SMOOTH AND NORMAL   CARDIOVERSION N/A 01/07/2015   Procedure: CARDIOVERSION;  Surgeon: Chilton Si, MD;  Location: [facility] ENDOSCOPY;  Service: Cardiovascular;  Laterality: N/A;   CARDIOVERSION N/A 03/11/2015   Procedure: CARDIOVERSION;  Surgeon: Chilton Si, MD;  Location: [facility] ENDOSCOPY;  Service: Cardiovascular;  Laterality: N/A;   CARPAL TUNNEL RELEASE Left 1970s   CATARACT EXTRACTION W/ INTRAOCULAR LENS  IMPLANT, BILATERAL Bilateral ~ 2010   COLONOSCOPY  08/14/08   FINGER FRACTURE SURGERY Right 1970s   "ring finger"   FRACTURE SURGERY     HEMATOMA EVACUATION Left 05/21/2021   Procedure: EVACUATION LEFT KNEE HEMATOMA;  Surgeon: Janne Napoleon, MD;  Location: [facility] OR;  Service: Plastics;  Laterality: Left;   LAPAROSCOPIC CHOLECYSTECTOMY  1980s   TUBAL LIGATION     VAGINAL HYSTERECTOMY  1980    Social History:  Ambulatory   walker  wheelchair bound     reports that she has never smoked. She has  never used smokeless tobacco. She reports that she does not drink alcohol and does not use drugs.    Family History:   Family History  Problem Relation Age of Onset   Stroke Father    Hypertension Father    Atrial fibrillation Father        HAD MURMUR   Heart failure Mother    Congestive Heart Failure Mother    Hypertension Mother    Hypertension Brother    Hypertension Sister    Hypertension Son    CAD Sister        EARLY   CAD Brother        EARLY   Colon cancer Neg Hx    Esophageal cancer Neg Hx    Stomach cancer Neg Hx    Rectal cancer Neg Hx    ______________________________________________________________________________________________ Allergies: Allergies  Allergen Reactions   Albuterol Other (See Comments)    REACTION: Tachycardia- AFib   Epinephrine Other (See Comments)    Increases heart rate per patient.    Penicillins Other (See Comments)    REACTION: flushing \\T \ hot  Did it involve swelling of the face/tongue/throat, SOB, or low BP? No Did it involve sudden or severe rash/hives, skin peeling, or any reaction on the inside of your mouth or nose? No Did you need to seek medical attention at a hospital or doctor's office? No When did it last happen?    20 years ago   If all above answers are "NO", may proceed with cephalosporin use.   Ceftriaxone Other (See Comments)    Sweats   Citalopram Hydrobromide Other (See Comments)    Prolonged QT prolongation   Exenatide Other (See Comments)    Unknown reaction   Ketorolac Tromethamine Hives and Swelling    swelling in hands   Tizanidine Other (See Comments)    lethargic   Torsemide Swelling   Zoledronic Acid Other (See Comments)    Pt was hospitalized due to medication   Bee Venom Other (See Comments)    "makes me nervous"   Ivp Dye [Iodinated Contrast Media] Other (See Comments)    flushing   Keflex [Cephalexin] Other (See Comments)    Makes her feel warm; she was told never to take it.     Prior  to Admission medications   Medication Sig Start Date End Date Taking? Authorizing Provider  allopurinol (ZYLOPRIM) 300 MG tablet Take 300  mg by mouth in the morning.   Yes [provider]  ALPRAZolam Prudy Feeler) 0.5 MG tablet Take 0.5 mg by mouth every 6 (six) hours as needed for anxiety.   Yes [provider]  colchicine 0.6 MG tablet Take 0.6 mg by mouth daily as needed (gout flares). 04/07/21  Yes [provider]  diclofenac Sodium (VOLTAREN) 1 % GEL 4 gram qid pn Patient taking differently: Apply 4 g topically 4 (four) times daily as needed (back pain). 01/07/21  Yes Levert Feinstein, MD  docusate sodium (COLACE) 100 MG capsule Take 100 mg by mouth daily as needed for mild constipation.   Yes [provider]  DULoxetine (CYMBALTA) 30 MG capsule Take 30 mg by mouth in the morning. 09/26/20  Yes [provider]  estradiol (ESTRACE) 0.1 MG/GM vaginal cream Place 1 Applicatorful vaginally as needed (vaginal irritation). 12/18/16  Yes [provider]  furosemide (LASIX) 80 MG tablet take ONE AND ONE-HALF TABLET BY MOUTH TWICE DAILY Patient taking differently: Take 120 mg by mouth 2 (two) times daily. 08/28/21  Yes Nahser, Deloris Ping, MD  insulin aspart (NOVOLOG FLEXPEN) 100 UNIT/ML FlexPen 40-70 Units See admin instructions. Less than 150 units 150=40 units151-199=45 units 200-249=50 units 250-299=55 units 300-349=60 units   Yes [provider]  Insulin Degludec (TRESIBA FLEXTOUCH) 200 UNIT/ML SOPN Inject 100 Units into the skin in the morning and at bedtime.   Yes [provider]  levothyroxine (SYNTHROID) 75 MCG tablet Take 75 mcg by mouth every morning. 06/20/19  Yes [provider]  lidocaine-prilocaine (EMLA) cream 1 gram qid prn Patient taking differently: Apply 1 Application topically 4 (four) times daily as needed (foot pain). 01/07/21  Yes Levert Feinstein, MD  LYRICA 50 MG capsule Take 50 mg by mouth 2 (two) times daily. 06/01/17  Yes  [provider]  metoprolol succinate (TOPROL-XL) 50 MG 24 hr tablet TAKE ONE TABLET BY MOUTH TWICE DAILY take with or immediately following a meal Patient taking differently: Take 50 mg by mouth in the morning and at bedtime. 09/26/20  Yes Nahser, Deloris Ping, MD  nystatin (MYCOSTATIN/NYSTOP) powder Apply 1 application topically as needed (skin irritation/rash). 01/13/19  Yes [provider]  ondansetron (ZOFRAN) 4 MG tablet Take 1 tablet (4 mg total) by mouth every 8 (eight) hours as needed for nausea or vomiting. 05/21/21  Yes Scheeler, Kermit Balo, PA-C  polyethylene glycol (MIRALAX / GLYCOLAX) 17 g packet Take 17 g by mouth daily as needed (constipation.).   Yes [provider]  spironolactone (ALDACTONE) 25 MG tablet Take 1 tablet (25 mg total) by mouth daily. 07/17/20  Yes Nahser, Deloris Ping, MD  Vitamin D, Ergocalciferol, (DRISDOL) 50000 UNITS CAPS Take 50,000 Units by mouth every Monday.   Yes [provider]  XARELTO 15 MG TABS tablet Take 15 mg by mouth in the morning. 06/27/16  Yes [provider]  BD PEN NEEDLE NANO U/F 32G X 4 MM MISC Inject 1 each as directed See admin instructions. Use pen needles with insulin pens daily 12/24/14   [provider]  HYDROcodone-acetaminophen (NORCO/VICODIN) 5-325 MG tablet Take 1 tablet by mouth every 6 (six) hours as needed for severe pain. 04/09/21   Felicie Morn, NP  L-Methylfolate-Algae-B12-B6 [facility]) 3-90.314-2-35 MG CAPS Take 1 tablet by mouth 2 (two) times daily. Patient not taking: Reported on 09/11/2021 01/07/21   Levert Feinstein, MD  levothyroxine (SYNTHROID) 88 MCG tablet Take 88 mcg by mouth every morning. 08/14/21   [provider]  metolazone (ZAROXOLYN) 2.5 MG tablet Take 1 tablet (2.5 mg total) by mouth 3 (three) times a week. Mon, Wed, Fri Patient not taking: Reported on 09/11/2021 08/16/19   Nahser, Deloris Ping, MD  oxyCODONE-acetaminophen (PERCOCET/ROXICET) 5-325 MG tablet Take 1 tablet by mouth  every 6 (six) hours as needed for severe pain. 03/24/21   Cathren Laine, MD  traMADol (ULTRAM) 50 MG tablet Take 50 mg by mouth 3 (three) times daily as needed (severe neuropathy pain.). 06/24/19   [provider]    ___________________________________________________________________________________________________ Physical Exam:    09/11/2021    9:45 PM 09/11/2021    9:24 PM 09/11/2021    9:23 PM  Vitals with BMI  Systolic 127 129   Diastolic 63 95   Pulse 87 94 100     1. General:  in No  Acute distress    Chronically ill   -appearing 2. Psychological: Alert and  Oriented 3. Head/ENT:   Moist  Mucous Membranes                          Head Non traumatic, neck supple                          Poor Dentition 4. SKIN: normal   Skin turgor,  Skin clean Dry and intact  Left foot ulcer non infected 5. Heart: Regular rate and rhythm no  Murmur, no Rub or gallop 6. Lungs:   no wheezes SOME  crackles  distant 7. Abdomen: Soft,  non-tender, Non distended   obese  bowel sounds present 8. Lower extremities: no clubbing, cyanosis,  3+ edema 9. Neurologically Grossly intact, moving all 4 extremities equally   10. MSK: Normal range of motion    Chart has been reviewed  ______________________________________________________________________________________________  Assessment/Plan 76 y.o. female with medical history significant of CKD, A.fib on anticoagulation, COPD, GERD, HTN, Morbid obesity, OSA, DM2, hypothyrodism  Charcot's foot, pulmonary hypertension  Admitted for   acute on chronic systolic diastolic CHF    Present on Admission:  Acute on chronic combined systolic and diastolic CHF (congestive heart failure) (HCC)  Anxiety  CKD (chronic kidney disease) stage 4, GFR 15-29 ml/min (HCC)  Diabetic peripheral neuropathy (HCC)  Essential hypertension  High cholesterol  Pulmonary hypertension (HCC)  Hypokalemia     Acute on chronic combined systolic and diastolic CHF  (congestive heart failure) (HCC) - Pt diagnosed with CHF based on presence of the following:  PND, OA, rales on exam,  Pulmonary edema on CXR, and  bilateral leg edema, DOE, With noted response to IV diuretic in ER  admit on telemetry,  cycle cardiac enzymes, Troponin 51   obtain serial ECG  to evaluate for ischemia as a cause of heart failure  monitor daily weight:  Filed Weights   09/11/21 2239  Weight: 135.5 kg   Last BNP BNP (last 3 results) Recent Labs    09/11/21 1730  BNP 368.7*       diurese with IV lasix and monitor orthostatics and creatinine to avoid over diuresis.  Order echogram to evaluate EF and valves  ACE/ARB  Contraindicated    cardiology emailed   Anxiety Continue home dose of Xanax 0.5 mg every 6 hours as needed  CKD (chronic kidney disease) stage 4, GFR 15-29 ml/min (HCC)  -chronic avoid nephrotoxic medications such as NSAIDs, Vanco Zosyn combo,  avoid hypotension, continue to follow renal function   Diabetes mellitus,  type 2 (HCC) Order sliding scale Continue insulin changed to Lantus for now continue at 100 units nightly Order diabetic coordinator consult  Diabetic peripheral neuropathy (HCC) Chronic stable continue Cymbalta 30 mg a day  Essential hypertension Continue Lopressor 25 mg at bedtime.and spironolactone 25 mg at bedtime If BP remains stable resume Toprol tomorrow  High cholesterol Check lipid panel  Pulmonary hypertension (HCC) With chronic benefit from cardiology consult repeat echogram oxygen as needed  Hypokalemia Replaced in ED repeat BMET    Other plan as per orders.  DVT prophylaxis:  xarelto    Code Status:    Code Status: Prior FULL CODE   as per patient  I had personally discussed CODE STATUS with patient and family     Family Communication:   Family   at  Bedside  plan of care was discussed   with  Husband   Disposition Plan:    To home once workup is complete and patient is stable   Following barriers  for discharge:                            Electrolytes corrected                                                        Will need consultants to evaluate patient prior to discharge                        Would benefit from PT/OT eval prior to DC  Ordered                                      Diabetes care coordinator                                     Nutrition    consulted                                       Consults called: email cardiology   Admission status:  ED Disposition     ED Disposition  Admit   Condition  --   Comment  Hospital Area: [facility] [[id number]]  Level of Care: Telemetry Cardiac [[id number]]  May admit patient to [facility] or [facility] if equivalent level of care is available:: No  Covid Evaluation: Asymptomatic - no recent exposure (last 10 days) testing not required  Diagnosis: CHF (congestive heart failure) [facility]) [[id number]]  Admitting Physician: Therisa Doyne [[id number]]  Attending Physician: Therisa Doyne [[id number]]  Estimated length of stay: past midnight tomorrow  Certification:: I certify this patient will need inpatient services for at least 2 midnights            inpatient     I Expect 2 midnight stay secondary to severity of patient's current illness need for inpatient interventions justified by the following:    Severe lab/radiological/exam abnormalities including:    Evidence of CHF and extensive comorbidities including:    DM2   CHF  COPD/asthma  Morbid Obesity  CKD   Chronic anticoagulation  That are currently affecting medical management.   I expect  patient to be hospitalized for 2 midnights requiring inpatient medical care.  Patient is at high risk for adverse outcome (such as loss of life or disability) if not treated.  Indication for inpatient stay as follows:   Sever dyspnea   Need for IV diuretics    Level of care     tele  For  24H     Kristine [name] 09/12/2021, 12:07 AM    [facility]     after 2 AM please page floor coverage PA If 7AM-7PM, please contact the day team taking care of the patient using [email or web address]   Patient was evaluated in the context of the global COVID-19 pandemic, which necessitated consideration that the patient might be at risk for infection with the SARS-CoV-2 virus that causes COVID-19. Institutional protocols and algorithms that pertain to the evaluation of patients at risk for COVID-19 are in a state of rapid change based on information released by regulatory bodies including the CDC and federal and state organizations. These policies and algorithms were followed during the patient's care.

## 2021-09-12 ENCOUNTER — Inpatient Hospital Stay (HOSPITAL_COMMUNITY): Payer: Medicare HMO

## 2021-09-12 ENCOUNTER — Encounter (HOSPITAL_COMMUNITY): Payer: Self-pay | Admitting: Internal Medicine

## 2021-09-12 ENCOUNTER — Other Ambulatory Visit: Payer: Self-pay

## 2021-09-12 DIAGNOSIS — E1142 Type 2 diabetes mellitus with diabetic polyneuropathy: Secondary | ICD-10-CM | POA: Diagnosis not present

## 2021-09-12 DIAGNOSIS — I5023 Acute on chronic systolic (congestive) heart failure: Secondary | ICD-10-CM | POA: Diagnosis not present

## 2021-09-12 DIAGNOSIS — I50813 Acute on chronic right heart failure: Secondary | ICD-10-CM | POA: Diagnosis not present

## 2021-09-12 DIAGNOSIS — I5031 Acute diastolic (congestive) heart failure: Secondary | ICD-10-CM

## 2021-09-12 DIAGNOSIS — N184 Chronic kidney disease, stage 4 (severe): Secondary | ICD-10-CM | POA: Diagnosis not present

## 2021-09-12 DIAGNOSIS — I482 Chronic atrial fibrillation, unspecified: Secondary | ICD-10-CM

## 2021-09-12 LAB — COMPREHENSIVE METABOLIC PANEL
ALT: 9 U/L (ref 0–44)
AST: 15 U/L (ref 15–41)
Albumin: 3.5 g/dL (ref 3.5–5.0)
Alkaline Phosphatase: 87 U/L (ref 38–126)
Anion gap: 12 (ref 5–15)
BUN: 51 mg/dL — ABNORMAL HIGH (ref 8–23)
CO2: 27 mmol/L (ref 22–32)
Calcium: 9.3 mg/dL (ref 8.9–10.3)
Chloride: 97 mmol/L — ABNORMAL LOW (ref 98–111)
Creatinine, Ser: 2.32 mg/dL — ABNORMAL HIGH (ref 0.44–1.00)
GFR, Estimated: 22 mL/min — ABNORMAL LOW (ref >60)
Glucose, Bld: 115 mg/dL — ABNORMAL HIGH (ref 70–99)
Potassium: 3 mmol/L — ABNORMAL LOW (ref 3.5–5.1)
Sodium: 136 mmol/L (ref 135–145)
Total Bilirubin: 1.5 mg/dL — ABNORMAL HIGH (ref 0.3–1.2)
Total Protein: 6.2 g/dL — ABNORMAL LOW (ref 6.5–8.1)

## 2021-09-12 LAB — CBC
HCT: 35.7 % — ABNORMAL LOW (ref 36.0–46.0)
Hemoglobin: 11.4 g/dL — ABNORMAL LOW (ref 12.0–15.0)
MCH: 29.4 pg (ref 26.0–34.0)
MCHC: 31.9 g/dL (ref 30.0–36.0)
MCV: 92 fL (ref 80.0–100.0)
Platelets: 154 10*3/uL (ref 150–400)
RBC: 3.88 MIL/uL (ref 3.87–5.11)
RDW: 19.1 % — ABNORMAL HIGH (ref 11.5–15.5)
WBC: 10.1 10*3/uL (ref 4.0–10.5)
nRBC: 0 % (ref 0.0–0.2)

## 2021-09-12 LAB — GLUCOSE, CAPILLARY
Glucose-Capillary: 161 mg/dL — ABNORMAL HIGH (ref 70–99)
Glucose-Capillary: 151 mg/dL — ABNORMAL HIGH (ref 70–99)
Glucose-Capillary: 154 mg/dL — ABNORMAL HIGH (ref 70–99)
Glucose-Capillary: 136 mg/dL — ABNORMAL HIGH (ref 70–99)
Glucose-Capillary: 232 mg/dL — ABNORMAL HIGH (ref 70–99)

## 2021-09-12 LAB — BASIC METABOLIC PANEL
Anion gap: 13 (ref 5–15)
BUN: 49 mg/dL — ABNORMAL HIGH (ref 8–23)
CO2: 24 mmol/L (ref 22–32)
Calcium: 9.5 mg/dL (ref 8.9–10.3)
Chloride: 102 mmol/L (ref 98–111)
Creatinine, Ser: 2.21 mg/dL — ABNORMAL HIGH (ref 0.44–1.00)
GFR, Estimated: 23 mL/min — ABNORMAL LOW (ref >60)
Glucose, Bld: 206 mg/dL — ABNORMAL HIGH (ref 70–99)
Potassium: 5.1 mmol/L (ref 3.5–5.1)
Sodium: 139 mmol/L (ref 135–145)

## 2021-09-12 LAB — HEMOGLOBIN A1C
Hgb A1c MFr Bld: 7 % — ABNORMAL HIGH (ref 4.8–5.6)
Mean Plasma Glucose: 154.2 mg/dL

## 2021-09-12 LAB — HEPATIC FUNCTION PANEL
ALT: 10 U/L (ref 0–44)
AST: 18 U/L (ref 15–41)
Albumin: 4.1 g/dL (ref 3.5–5.0)
Alkaline Phosphatase: 105 U/L (ref 38–126)
Bilirubin, Direct: 0.6 mg/dL — ABNORMAL HIGH (ref 0.0–0.2)
Indirect Bilirubin: 1.1 mg/dL — ABNORMAL HIGH (ref 0.3–0.9)
Total Bilirubin: 1.7 mg/dL — ABNORMAL HIGH (ref 0.3–1.2)
Total Protein: 7.5 g/dL (ref 6.5–8.1)

## 2021-09-12 LAB — CBG MONITORING, ED: Glucose-Capillary: 82 mg/dL (ref 70–99)

## 2021-09-12 LAB — ECHOCARDIOGRAM COMPLETE
AR max vel: 0.85 cm2
AV Peak grad: 14.1 mmHg
Ao pk vel: 1.88 m/s
Area-P 1/2: 4.63 cm2
Height: 69 in
S' Lateral: 3.3 cm
Weight: 4748.8 oz

## 2021-09-12 LAB — PREALBUMIN: Prealbumin: 7.8 mg/dL — ABNORMAL LOW (ref 18–38)

## 2021-09-12 LAB — TSH: TSH: 5.314 u[IU]/mL — ABNORMAL HIGH (ref 0.350–4.500)

## 2021-09-12 MED ORDER — POTASSIUM CHLORIDE CRYS ER 20 MEQ PO TBCR
40.0000 meq | EXTENDED_RELEASE_TABLET | Freq: Once | ORAL | Status: AC
  Administered 2021-09-12: 40 meq via ORAL
  Filled 2021-09-12: qty 2

## 2021-09-12 MED ORDER — ALLOPURINOL 300 MG PO TABS
300.0000 mg | ORAL_TABLET | Freq: Every morning | ORAL | Status: DC
  Administered 2021-09-13 – 2021-09-19 (×7): 300 mg via ORAL
  Filled 2021-09-12 (×7): qty 1

## 2021-09-12 MED ORDER — INSULIN GLARGINE-YFGN 100 UNIT/ML ~~LOC~~ SOLN: 40.0000 [IU] | Freq: Every day | SUBCUTANEOUS | Status: DC

## 2021-09-12 MED ORDER — PREGABALIN 25 MG PO CAPS
50.0000 mg | ORAL_CAPSULE | Freq: Two times a day (BID) | ORAL | Status: DC
  Administered 2021-09-12 – 2021-09-18 (×12): 50 mg via ORAL
  Administered 2021-09-18: 25 mg via ORAL
  Administered 2021-09-19: 50 mg via ORAL
  Filled 2021-09-12 (×14): qty 2

## 2021-09-12 MED ORDER — NYSTATIN 100000 UNIT/GM EX CREA
TOPICAL_CREAM | Freq: Two times a day (BID) | CUTANEOUS | Status: DC
  Filled 2021-09-12 (×3): qty 30

## 2021-09-12 MED ORDER — METOPROLOL SUCCINATE ER 25 MG PO TB24
12.5000 mg | ORAL_TABLET | Freq: Every day | ORAL | Status: DC
  Administered 2021-09-12 – 2021-09-17 (×6): 12.5 mg via ORAL
  Filled 2021-09-12 (×6): qty 1

## 2021-09-12 MED ORDER — INSULIN ASPART 100 UNIT/ML IJ SOLN
0.0000 [IU] | Freq: Three times a day (TID) | INTRAMUSCULAR | Status: DC
  Administered 2021-09-12 – 2021-09-13 (×2): 1 [IU] via SUBCUTANEOUS
  Administered 2021-09-13: 2 [IU] via SUBCUTANEOUS
  Administered 2021-09-14 (×2): 3 [IU] via SUBCUTANEOUS
  Administered 2021-09-14 – 2021-09-15 (×4): 2 [IU] via SUBCUTANEOUS
  Administered 2021-09-16 (×3): 3 [IU] via SUBCUTANEOUS
  Administered 2021-09-17: 5 [IU] via SUBCUTANEOUS
  Administered 2021-09-17 (×2): 3 [IU] via SUBCUTANEOUS
  Administered 2021-09-18 (×2): 2 [IU] via SUBCUTANEOUS
  Administered 2021-09-18: 1 [IU] via SUBCUTANEOUS
  Administered 2021-09-19: 3 [IU] via SUBCUTANEOUS

## 2021-09-12 MED ORDER — FUROSEMIDE 10 MG/ML IJ SOLN
80.0000 mg | Freq: Two times a day (BID) | INTRAMUSCULAR | Status: DC
  Administered 2021-09-12 – 2021-09-13 (×2): 80 mg via INTRAVENOUS
  Filled 2021-09-12 (×2): qty 8

## 2021-09-12 MED ORDER — POTASSIUM CHLORIDE CRYS ER 20 MEQ PO TBCR
40.0000 meq | EXTENDED_RELEASE_TABLET | ORAL | Status: AC
  Administered 2021-09-12 (×2): 40 meq via ORAL
  Filled 2021-09-12 (×2): qty 2

## 2021-09-12 MED ORDER — INSULIN GLARGINE-YFGN 100 UNIT/ML ~~LOC~~ SOLN: 100.0000 [IU] | Freq: Every day | SUBCUTANEOUS | Status: DC

## 2021-09-12 MED ORDER — LIVING WELL WITH DIABETES BOOK
Freq: Once | Status: AC
  Filled 2021-09-12: qty 1

## 2021-09-12 NOTE — Consult Note (Signed)
WOC Nurse Consult Note: Reason for Consult:left foot, plantar aspect chronic, nonhealing, neuropathic lesion. She has a known charcot foot deformity. She follows for the ulcer noted today with her podiatrist, Dr. Ulis Rias at [facility]. Her lat visit was January 3 of this year.She wears a surgical shoe on the left foot regularly to offload the ulcer site.Silver sulfadiazine has been in use from that provider.  Wound type:Neuropathic Pressure Injury POA: N/A Measurement:1.1cm round Per bedside RN, R. Arevalo.Her expertise and assistance is appreciated. Wound bed:Red, moist Drainage (amount, consistency, odor) small amount serosanguinous exudate Periwound:Intact Dressing procedure/placement/frequency: I have provided guidance for the topical care of this lesion using a soap and water cleanse of the foot daily to decrease bioburden, followed by rinse and thorough drying. Topical care to the lesion will be with a size appropriate piece of silver hydrofiber for its absorptive and antimicrobial properties. This will be topped with a dry gauze and secured with a silicone foam dressing.  Recommend return visit to the podiatrist provider above post discharge. Recommend continuation of offloading shoe use when ambulating.  WOC nursing team will not follow, but will remain available to this patient, the nursing and medical teams.  Please re-consult if needed.  Thank you for inviting [place] to participate in this patient's Plan of Care.  Ladona Mow, MSN, RN, CNS, GNP, Leda Min, [employer], [employer] phone:  [phone]

## 2021-09-12 NOTE — Evaluation (Signed)
Occupational Therapy Evaluation Patient Details Name: Kristine [name] MRN: [id number] DOB: 26-Aug-1945 Today's Date: 09/12/2021   History of Present Illness 76 y.o. female presents to [facility] hospital on 09/11/2021 with worsening edema and SOB after her diuretic was recently reduced. PMH includes CKD, afib, COPD, GERD, HTN, DMII, PAH, Charcot's foot.   Clinical Impression   Pt admitted for concerns listed above. PTA pt reported that she was requiring min assist with sit<>stands and LB ADL's. At this time, pt presents with increased weakness and and decreased activity tolerance. She is requiring mod A for assist to power up to standing, min guard for ADL tasks in standing. Husband reports that he will continue to be able to provide physical assist for her at home. Recommend  HHOT to maximize independence and safety. OT will follow up acutely.        Recommendations for follow up therapy are one component of a multi-disciplinary discharge planning process, led by the attending physician.  Recommendations may be updated based on patient status, additional functional criteria and insurance authorization.   Follow Up Recommendations  Home health OT    Assistance Recommended at Discharge Intermittent Supervision/Assistance  Patient can return home with the following A lot of help with walking and/or transfers;A lot of help with bathing/dressing/bathroom;Assistance with cooking/housework;Direct supervision/assist for medications management;Assist for transportation;Help with stairs or ramp for entrance    Functional Status Assessment  Patient has had a recent decline in their functional status and demonstrates the ability to make significant improvements in function in a reasonable and predictable amount of time.  Equipment Recommendations  None recommended by OT (pt has all needed AD)    Recommendations for Other Services       Precautions / Restrictions Precautions Precautions:  Fall Required Braces or Orthoses: Other Brace (diabetic shoes) Restrictions Weight Bearing Restrictions: No      Mobility Bed Mobility Overal bed mobility: Needs Assistance Bed Mobility: Supine to Sit, Sit to Supine     Supine to sit: Min assist Sit to supine: Min assist   General bed mobility comments: Min A to pull to sitting and manage BLE back into bed    Transfers Overall transfer level: Needs assistance Equipment used: Rolling walker (2 wheels) Transfers: Sit to/from Stand Sit to Stand: Mod assist           General transfer comment: Mod A from bed and BSC      Balance Overall balance assessment: Needs assistance Sitting-balance support: Single extremity supported, Feet supported Sitting balance-Leahy Scale: Good     Standing balance support: Bilateral upper extremity supported, Reliant on assistive device for balance Standing balance-Leahy Scale: Poor                             ADL either performed or assessed with clinical judgement   ADL Overall ADL's : Needs assistance/impaired Eating/Feeding: Set up;Sitting   Grooming: Set up;Sitting   Upper Body Bathing: Min guard;Sitting   Lower Body Bathing: Moderate assistance;Sitting/lateral leans;Sit to/from stand   Upper Body Dressing : Min guard;Sitting   Lower Body Dressing: Moderate assistance;Sit to/from stand;Sitting/lateral leans   Toilet Transfer: Moderate assistance;Ambulation   Toileting- Clothing Manipulation and Hygiene: Minimal assistance;Sitting/lateral lean;Sit to/from stand       Functional mobility during ADLs: Min guard;Rolling walker (2 wheels) General ADL Comments: Pt presents with increased weakness and balance deficits, requiring increased assist to stand and balance during standing activities.  Vision Baseline Vision/History: 0 No visual deficits Ability to See in Adequate Light: 0 Adequate Patient Visual Report: No change from baseline Vision Assessment?: No  apparent visual deficits     Perception     Praxis      Pertinent Vitals/Pain Pain Assessment Pain Assessment: Faces Faces Pain Scale: Hurts little more Pain Location: LLE Pain Descriptors / Indicators: Discomfort, Grimacing Pain Intervention(s): Monitored during session, Repositioned     Hand Dominance Right   Extremity/Trunk Assessment Upper Extremity Assessment Upper Extremity Assessment: Generalized weakness   Lower Extremity Assessment Lower Extremity Assessment: Defer to PT evaluation   Cervical / Trunk Assessment Cervical / Trunk Assessment: Kyphotic   Communication Communication Communication: No difficulties   Cognition Arousal/Alertness: Awake/alert Behavior During Therapy: WFL for tasks assessed/performed Overall Cognitive Status: Within Functional Limits for tasks assessed                                       General Comments  VSS on RA O2 sats on separate note    Exercises     Shoulder Instructions      Home Living Family/patient expects to be discharged to:: Private residence Living Arrangements: Spouse/significant other Available Help at Discharge: Family;Available 24 hours/day Type of Home: House Home Access: Ramped entrance     Home Layout: Two level;Able to live on main level with bedroom/bathroom     Bathroom Shower/Tub: Producer, television/film/video: Handicapped height Bathroom Accessibility: Yes How Accessible: Accessible via walker Home Equipment: Rolling Walker (2 wheels);Rollator (4 wheels);Shower seat - built in;BSC/3in1;Grab bars - toilet;Grab bars - tub/shower;Wheelchair - manual          Prior Functioning/Environment Prior Level of Function : Needs assist             Mobility Comments: Pt needs assist to come to standing ADLs Comments: Needs assist with all LB ADL's        OT Problem List: Decreased strength;Decreased activity tolerance;Impaired balance (sitting and/or standing);Decreased  safety awareness;Cardiopulmonary status limiting activity;Obesity;Increased edema      OT Treatment/Interventions: Self-care/ADL training;Therapeutic exercise;Energy conservation;DME and/or AE instruction;Therapeutic activities;Patient/family education;Balance training    OT Goals(Current goals can be found in the care plan section) Acute Rehab OT Goals Patient Stated Goal: To get stronger OT Goal Formulation: With patient Time For Goal Achievement: 09/26/21 Potential to Achieve Goals: Good ADL Goals Pt Will Perform Grooming: with modified independence;standing Pt Will Perform Lower Body Bathing: with min guard assist;with adaptive equipment;sitting/lateral leans;sit to/from stand Pt Will Perform Lower Body Dressing: with min guard assist;sitting/lateral leans;sit to/from stand;with adaptive equipment Pt Will Transfer to Toilet: with min guard assist;ambulating Pt Will Perform Toileting - Clothing Manipulation and hygiene: with min guard assist;sitting/lateral leans;sit to/from stand  OT Frequency: Min 2X/week    Co-evaluation              AM-PAC OT "6 Clicks" Daily Activity     Outcome Measure Help from another person eating meals?: A Little Help from another person taking care of personal grooming?: A Little Help from another person toileting, which includes using toliet, bedpan, or urinal?: A Lot Help from another person bathing (including washing, rinsing, drying)?: A Lot Help from another person to put on and taking off regular upper body clothing?: A Little Help from another person to put on and taking off regular lower body clothing?: A Lot 6 Click Score: 15  End of Session Equipment Utilized During Treatment: Gait belt;Rolling walker (2 wheels) Nurse Communication: Mobility status  Activity Tolerance: Patient tolerated treatment well Patient left: in bed;with call bell/phone within reach;with family/visitor present  OT Visit Diagnosis: Unsteadiness on feet  (R26.81);Other abnormalities of gait and mobility (R26.89);Muscle weakness (generalized) (M62.81)                Time: 1324-4010 OT Time Calculation (min): 32 min Charges:  OT General Charges $OT Visit: 1 Visit OT Evaluation $OT Eval Moderate Complexity: 1 Mod OT Treatments $Self Care/Home Management : 8-22 mins  Aya Geisel H., OTR/L Acute Rehabilitation  Joelle Flessner Elane Bing Plume 09/12/2021, 1:40 PM

## 2021-09-12 NOTE — Evaluation (Signed)
Physical Therapy Evaluation Patient Details Name: Kristine [name] MRN: [id number] DOB: [birth year] Today's Date: 09/12/2021  History of Present Illness  76 y.o. female presents to [facility] hospital on 09/11/2021 with worsening edema and SOB after her diuretic was recently reduced. PMH includes CKD, afib, COPD, GERD, HTN, DMII, PAH, Charcot's foot.  Clinical Impression  Pt presents to PT with deficits in strength, power, gait, functional mobility, activity tolerance. Pt is generally weak and activity tolerance is limited by reports of feeling winded, although SpO2 stable when with good waveform on monitor. Pt will benefit from aggressive mobilization in an effort to improve endurance and independence in household mobility. Further strength/power training could allow the patient to require reduced assistance from spouse when transferring at home. PT recommends HHPT at the time of discharge.       Recommendations for follow up therapy are one component of a multi-disciplinary discharge planning process, led by the attending physician.  Recommendations may be updated based on patient status, additional functional criteria and insurance authorization.  Follow Up Recommendations Home health PT      Assistance Recommended at Discharge Intermittent Supervision/Assistance  Patient can return home with the following  A lot of help with walking and/or transfers;A lot of help with bathing/dressing/bathroom;Assistance with cooking/housework;Assist for transportation;Help with stairs or ramp for entrance    Equipment Recommendations None recommended by PT  Recommendations for Other Services       Functional Status Assessment Patient has had a recent decline in their functional status and demonstrates the ability to make significant improvements in function in a reasonable and predictable amount of time.     Precautions / Restrictions Precautions Precautions: Fall Required Braces or Orthoses: Other  Brace (diabetic shoes) Restrictions Weight Bearing Restrictions: No      Mobility  Bed Mobility Overal bed mobility: Needs Assistance Bed Mobility: Supine to Sit     Supine to sit: Min assist, HOB elevated     General bed mobility comments: use of rails    Transfers Overall transfer level: Needs assistance Equipment used: Rolling walker (2 wheels), 1 person hand held assist Transfers: Sit to/from Stand Sit to Stand: Mod assist           General transfer comment: modA from bed, pt locks unilateral UE with PT to pull into standing with other extremity on walker (this is the technique the pt and spouse utilize at baseline_    Ambulation/Gait Ambulation/Gait assistance: Min guard Gait Distance (Feet): 30 Feet Assistive device: Rolling walker (2 wheels) Gait Pattern/deviations: Step-to pattern Gait velocity: reduced Gait velocity interpretation: <1.8 ft/sec, indicate of risk for recurrent falls   General Gait Details: pt with slowed step-to gait, reduced foot clearance bilaterally  Stairs            Wheelchair Mobility    Modified Rankin (Stroke Patients Only)       Balance Overall balance assessment: Needs assistance Sitting-balance support: No upper extremity supported, Feet supported Sitting balance-Leahy Scale: Good     Standing balance support: Bilateral upper extremity supported, Reliant on assistive device for balance Standing balance-Leahy Scale: Poor                               Pertinent Vitals/Pain Pain Assessment Pain Assessment: Faces Faces Pain Scale: Hurts little more Pain Location: LLE Pain Descriptors / Indicators: Discomfort, Grimacing Pain Intervention(s): Monitored during session    Home Living Family/patient expects to be discharged to::  Private residence Living Arrangements: Spouse/significant other Available Help at Discharge: Family;Available 24 hours/day Type of Home: House Home Access: Ramped entrance        Home Layout: Two level;Able to live on main level with bedroom/bathroom Home Equipment: Rolling Walker (2 wheels);Rollator (4 wheels);Shower seat - built in;BSC/3in1;Grab bars - toilet;Grab bars - tub/shower;Wheelchair - manual      Prior Function Prior Level of Function : Needs assist             Mobility Comments: Pt needs assist to come to standing ADLs Comments: Needs assist with all LB ADL's     Hand Dominance   Dominant Hand: Right    Extremity/Trunk Assessment   Upper Extremity Assessment Upper Extremity Assessment: Generalized weakness    Lower Extremity Assessment Lower Extremity Assessment: Defer to PT evaluation    Cervical / Trunk Assessment Cervical / Trunk Assessment: Kyphotic  Communication   Communication: No difficulties  Cognition Arousal/Alertness: Awake/alert Behavior During Therapy: WFL for tasks assessed/performed Overall Cognitive Status: Within Functional Limits for tasks assessed                                          General Comments General comments (skin integrity, edema, etc.): VSS on RA O2 sats on separate note    Exercises     Assessment/Plan    PT Assessment Patient needs continued PT services  PT Problem List Decreased strength;Decreased activity tolerance;Decreased balance;Decreased mobility;Decreased knowledge of use of DME;Pain;Cardiopulmonary status limiting activity       PT Treatment Interventions DME instruction;Gait training;Functional mobility training;Therapeutic activities;Therapeutic exercise;Balance training;Neuromuscular re-education;Patient/family education    PT Goals (Current goals can be found in the Care Plan section)  Acute Rehab PT Goals Patient Stated Goal: to return to baseline PT Goal Formulation: With patient Time For Goal Achievement: 09/26/21 Potential to Achieve Goals: Good    Frequency Min 3X/week     Co-evaluation               AM-PAC PT "6 Clicks"  Mobility  Outcome Measure Help needed turning from your back to your side while in a flat bed without using bedrails?: A Little Help needed moving from lying on your back to sitting on the side of a flat bed without using bedrails?: A Little Help needed moving to and from a bed to a chair (including a wheelchair)?: A Lot Help needed standing up from a chair using your arms (e.g., wheelchair or bedside chair)?: A Lot Help needed to walk in hospital room?: A Little Help needed climbing 3-5 steps with a railing? : Total 6 Click Score: 14    End of Session   Activity Tolerance: Patient tolerated treatment well Patient left: in chair;with call bell/phone within reach;with chair alarm set Nurse Communication: Mobility status PT Visit Diagnosis: Other abnormalities of gait and mobility (R26.89);Muscle weakness (generalized) (M62.81)    Time: 1203-1221 PT Time Calculation (min) (ACUTE ONLY): 18 min   Charges:   PT Evaluation $PT Eval Low Complexity: 1 Low          Arlyss Gandy, PT, DPT Acute Rehabilitation Office [phone]   Arlyss Gandy 09/12/2021, 1:40 PM

## 2021-09-12 NOTE — Progress Notes (Signed)
Heart Failure Nurse Navigator Progress Note  PCP: Chilton Greathouse, MD PCP-Cardiologist: Nahser Admission Diagnosis: Acute Pulmonary Edema. Admitted from: Home  Presentation:   Kristine [name] presented with shortness of breath, worsening edema to her lower extremities and abdomen. Patient stated that several weeks ago her nephrologist stopped her metolazone due to worsening kidney function. It was after this she started noticing her fluid gain. BP 125/76, HR 96, 3+ pitting edema, BNP 368.7, Troponin 51, potassium 3.0. Chest 2 view, showed mild to moderate cardiomegaly with mild pulmonary vascular congestion, mild bilateral atelectasis.   Patient educated on the sign and symptoms of heart failure, daily weights, when to call her doctor or go to the ER. The importance of diet/ fluid restrictions (states she eats a lot from can foods) the importance of taking all her medications as prescribed, attending all her medical appointments. Patient voiced her understanding, is scheduled for a hospital HF TOC follow up on 10/01/21 @ 11 am.   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   Anxiety    Arthritis    Asthma    Atrial fibrillation (HCC)    CAP (community acquired pneumonia) 05/31/2015   Cataract    CHF (congestive heart failure) (HCC)    Chronic bronchitis (HCC)    Chronic kidney disease    Clotting disorder (HCC)    Community acquired pneumonia 05/31/2015   Depression    Dysrhythmia    GERD (gastroesophageal reflux disease)    High cholesterol    History of hiatal hernia    HTN (hypertension)    Mild aortic sclerosis    Morbid obesity (HCC)    Neuromuscular disorder (HCC)    Neuropathic pain    Osteoporosis    Persistent atrial fibrillation (HCC)    Scoliosis    Sleep apnea    Spinal stenosis    Thyroid disease    Type II diabetes mellitus (HCC)    Vitamin D deficiency       Social History   Socioeconomic History   Marital status: Married    Spouse name: Raevyn Fenzel   Number of children: 4   Years of education: 14   Highest education level: Bachelor's degree (e.g., BA, AB, BS)  Occupational History   Occupation: Retired  Tobacco Use   Smoking status: Never   Smokeless tobacco: Never  Vaping Use   Vaping Use: Never used  Substance and Sexual Activity   Alcohol use: No   Drug use: No   Sexual activity: Yes  Other Topics Concern   Not on file  Social History Narrative   Pt lives in [place] with spouse.   Retired Metallurgist.  RN.   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   Right-handed.   1 cup caffeine per day.   Social Determinants of Health   Financial Resource Strain: Low Risk  (09/12/2021)   Overall Financial Resource Strain (CARDIA)    Difficulty of Paying Living Expenses: Not very hard  Food Insecurity: No Food Insecurity (09/12/2021)   Hunger Vital Sign    Worried About Running Out of Food in the Last Year: Never true    Ran Out of Food in the Last Year: Never true  Transportation Needs: No Transportation Needs (09/12/2021)   PRAPARE - Administrator, Civil Service (Medical):  No    Lack of Transportation (Non-Medical): No  Physical Activity: Not on file  Stress: Not on file  Social Connections: Not on file   Education Assessment and Provision:  Detailed education and instructions provided on heart failure disease management including the following:  Signs and symptoms of Heart Failure When to call the physician Importance of daily weights Low sodium diet Fluid restriction Medication management Anticipated future follow-up appointments  Patient education given on each of the above topics.  Patient acknowledges understanding via teach back method and acceptance of all instructions.  Education Materials:  "Living Better With Heart Failure"  Booklet, HF zone tool, & Daily Weight Tracker Tool.  Patient has scale at home: yes Patient has pill box at home: yes    High Risk Criteria for Readmission and/or Poor Patient Outcomes: Heart failure hospital admissions (last 6 months): 0  No Show rate: 9% Difficult social situation: no Demonstrates medication adherence: Yes Primary Language: English Literacy level: Reading, writing, and comprehension.   Barriers of Care:   Daily weights Diet/ fluid restrictions ( can foods)  Considerations/Referrals:   Referral made to Heart Failure Pharmacist Stewardship: yes Referral made to Heart Failure CSW/NCM TOC: no Referral made to Heart & Vascular TOC clinic: yes, 10/01/21 @ 11 am.   Items for Follow-up on DC/TOC: Daily weights Diet/ fluid restrictions (use of can goods)    Rhae Hammock, Scientist, research (physical sciences), Scientist, clinical (histocompatibility and immunogenetics) Only

## 2021-09-13 DIAGNOSIS — E1142 Type 2 diabetes mellitus with diabetic polyneuropathy: Secondary | ICD-10-CM | POA: Diagnosis not present

## 2021-09-13 DIAGNOSIS — I482 Chronic atrial fibrillation, unspecified: Secondary | ICD-10-CM | POA: Diagnosis not present

## 2021-09-13 DIAGNOSIS — N184 Chronic kidney disease, stage 4 (severe): Secondary | ICD-10-CM | POA: Diagnosis not present

## 2021-09-13 DIAGNOSIS — I50811 Acute right heart failure: Secondary | ICD-10-CM | POA: Diagnosis not present

## 2021-09-13 DIAGNOSIS — I5043 Acute on chronic combined systolic (congestive) and diastolic (congestive) heart failure: Secondary | ICD-10-CM

## 2021-09-13 DIAGNOSIS — I10 Essential (primary) hypertension: Secondary | ICD-10-CM | POA: Diagnosis not present

## 2021-09-13 DIAGNOSIS — I5041 Acute combined systolic (congestive) and diastolic (congestive) heart failure: Secondary | ICD-10-CM | POA: Diagnosis not present

## 2021-09-13 LAB — GLUCOSE, CAPILLARY
Glucose-Capillary: 99 mg/dL (ref 70–99)
Glucose-Capillary: 155 mg/dL — ABNORMAL HIGH (ref 70–99)
Glucose-Capillary: 132 mg/dL — ABNORMAL HIGH (ref 70–99)
Glucose-Capillary: 225 mg/dL — ABNORMAL HIGH (ref 70–99)

## 2021-09-13 MED ORDER — FUROSEMIDE 10 MG/ML IJ SOLN
100.0000 mg | Freq: Three times a day (TID) | INTRAVENOUS | Status: DC
  Administered 2021-09-13 – 2021-09-18 (×15): 100 mg via INTRAVENOUS
  Filled 2021-09-13 (×17): qty 10

## 2021-09-13 NOTE — Progress Notes (Signed)
Brief Nutrition Note  RD consulted for diet education. Pt seen by unit RD yesterday for diet education regarding CHF and diabetes. Unit RD spoke with both pt and husband and provided handouts and diet education.  Spoke with pt via phone call to room. Pt reports that she read the handout she received yesterday but is requesting "food plans" and for RD to provide specific meal plans for her of "what goes with what and what doesn't go with what." Briefly reviewed diet education provided yesterday. Discussed with pt that her specific request may be best addressed in an outpatient setting. Pt agreeable to RD placing outpatient referral to RD for further, more individualized diet education.  Pt consuming 75% of meals at this time. Labs and medications reviewed. No further nutrition interventions warranted at this time. If additional nutrition issues arise, please re-consult RD.   Mertie Clause, MS, RD, LDN Inpatient Clinical Dietitian Please see AMiON for contact information.

## 2021-09-14 DIAGNOSIS — I5041 Acute combined systolic (congestive) and diastolic (congestive) heart failure: Secondary | ICD-10-CM | POA: Diagnosis not present

## 2021-09-14 DIAGNOSIS — E1142 Type 2 diabetes mellitus with diabetic polyneuropathy: Secondary | ICD-10-CM | POA: Diagnosis not present

## 2021-09-14 DIAGNOSIS — N184 Chronic kidney disease, stage 4 (severe): Secondary | ICD-10-CM | POA: Diagnosis not present

## 2021-09-14 DIAGNOSIS — I10 Essential (primary) hypertension: Secondary | ICD-10-CM | POA: Diagnosis not present

## 2021-09-14 DIAGNOSIS — I5043 Acute on chronic combined systolic (congestive) and diastolic (congestive) heart failure: Secondary | ICD-10-CM | POA: Diagnosis not present

## 2021-09-14 DIAGNOSIS — I482 Chronic atrial fibrillation, unspecified: Secondary | ICD-10-CM | POA: Diagnosis not present

## 2021-09-14 DIAGNOSIS — I50811 Acute right heart failure: Secondary | ICD-10-CM | POA: Diagnosis not present

## 2021-09-14 LAB — GLUCOSE, CAPILLARY
Glucose-Capillary: 211 mg/dL — ABNORMAL HIGH (ref 70–99)
Glucose-Capillary: 229 mg/dL — ABNORMAL HIGH (ref 70–99)
Glucose-Capillary: 177 mg/dL — ABNORMAL HIGH (ref 70–99)
Glucose-Capillary: 121 mg/dL — ABNORMAL HIGH (ref 70–99)

## 2021-09-14 LAB — BASIC METABOLIC PANEL
Anion gap: 11 (ref 5–15)
BUN: 48 mg/dL — ABNORMAL HIGH (ref 8–23)
CO2: 28 mmol/L (ref 22–32)
Calcium: 9.4 mg/dL (ref 8.9–10.3)
Chloride: 98 mmol/L (ref 98–111)
Creatinine, Ser: 2.22 mg/dL — ABNORMAL HIGH (ref 0.44–1.00)
GFR, Estimated: 23 mL/min — ABNORMAL LOW (ref >60)
Glucose, Bld: 234 mg/dL — ABNORMAL HIGH (ref 70–99)
Potassium: 3.9 mmol/L (ref 3.5–5.1)
Sodium: 137 mmol/L (ref 135–145)

## 2021-09-14 MED ORDER — BISACODYL 10 MG RE SUPP: 10.0000 mg | Freq: Every day | RECTAL | Status: DC | PRN

## 2021-09-14 MED ORDER — POLYETHYLENE GLYCOL 3350 17 G PO PACK
17.0000 g | PACK | Freq: Two times a day (BID) | ORAL | Status: DC
  Administered 2021-09-14 – 2021-09-18 (×9): 17 g via ORAL
  Filled 2021-09-14 (×10): qty 1

## 2021-09-14 MED ORDER — METOLAZONE 5 MG PO TABS
5.0000 mg | ORAL_TABLET | Freq: Once | ORAL | Status: AC
  Administered 2021-09-14: 5 mg via ORAL
  Filled 2021-09-14: qty 1

## 2021-09-14 MED ORDER — INSULIN GLARGINE-YFGN 100 UNIT/ML ~~LOC~~ SOLN
20.0000 [IU] | Freq: Two times a day (BID) | SUBCUTANEOUS | Status: DC
  Administered 2021-09-14 (×2): 20 [IU] via SUBCUTANEOUS
  Filled 2021-09-14 (×5): qty 0.2

## 2021-09-15 ENCOUNTER — Other Ambulatory Visit (HOSPITAL_COMMUNITY): Payer: Self-pay

## 2021-09-15 DIAGNOSIS — I5041 Acute combined systolic (congestive) and diastolic (congestive) heart failure: Secondary | ICD-10-CM | POA: Diagnosis not present

## 2021-09-15 DIAGNOSIS — I4819 Other persistent atrial fibrillation: Secondary | ICD-10-CM

## 2021-09-15 DIAGNOSIS — I5043 Acute on chronic combined systolic (congestive) and diastolic (congestive) heart failure: Secondary | ICD-10-CM | POA: Diagnosis not present

## 2021-09-15 DIAGNOSIS — I50811 Acute right heart failure: Secondary | ICD-10-CM | POA: Diagnosis not present

## 2021-09-15 DIAGNOSIS — I10 Essential (primary) hypertension: Secondary | ICD-10-CM | POA: Diagnosis not present

## 2021-09-15 DIAGNOSIS — E1142 Type 2 diabetes mellitus with diabetic polyneuropathy: Secondary | ICD-10-CM | POA: Diagnosis not present

## 2021-09-15 DIAGNOSIS — N184 Chronic kidney disease, stage 4 (severe): Secondary | ICD-10-CM | POA: Diagnosis not present

## 2021-09-15 LAB — BASIC METABOLIC PANEL
Anion gap: 10 (ref 5–15)
BUN: 46 mg/dL — ABNORMAL HIGH (ref 8–23)
CO2: 30 mmol/L (ref 22–32)
Calcium: 9.8 mg/dL (ref 8.9–10.3)
Chloride: 99 mmol/L (ref 98–111)
Creatinine, Ser: 2.11 mg/dL — ABNORMAL HIGH (ref 0.44–1.00)
GFR, Estimated: 24 mL/min — ABNORMAL LOW (ref >60)
Glucose, Bld: 132 mg/dL — ABNORMAL HIGH (ref 70–99)
Potassium: 3.7 mmol/L (ref 3.5–5.1)
Sodium: 139 mmol/L (ref 135–145)

## 2021-09-15 LAB — GLUCOSE, CAPILLARY
Glucose-Capillary: 183 mg/dL — ABNORMAL HIGH (ref 70–99)
Glucose-Capillary: 180 mg/dL — ABNORMAL HIGH (ref 70–99)
Glucose-Capillary: 184 mg/dL — ABNORMAL HIGH (ref 70–99)
Glucose-Capillary: 200 mg/dL — ABNORMAL HIGH (ref 70–99)

## 2021-09-15 MED ORDER — INSULIN GLARGINE-YFGN 100 UNIT/ML ~~LOC~~ SOLN
25.0000 [IU] | Freq: Two times a day (BID) | SUBCUTANEOUS | Status: DC
  Administered 2021-09-15 – 2021-09-16 (×3): 25 [IU] via SUBCUTANEOUS
  Filled 2021-09-15 (×4): qty 0.25

## 2021-09-15 MED ORDER — METOLAZONE 5 MG PO TABS
5.0000 mg | ORAL_TABLET | Freq: Once | ORAL | Status: AC
  Administered 2021-09-15: 5 mg via ORAL
  Filled 2021-09-15: qty 1

## 2021-09-15 MED ORDER — POTASSIUM CHLORIDE CRYS ER 20 MEQ PO TBCR
40.0000 meq | EXTENDED_RELEASE_TABLET | Freq: Once | ORAL | Status: AC
  Administered 2021-09-15: 40 meq via ORAL
  Filled 2021-09-15: qty 2

## 2021-09-15 NOTE — Progress Notes (Signed)
Mobility Specialist: Progress Note   09/15/21 1406  Mobility  Activity Ambulated with assistance in hallway  Level of Assistance Standby assist, set-up cues, supervision of patient - no hands on  Assistive Device Front wheel walker  Distance Ambulated (ft) 100 ft  Activity Response Tolerated well  $Mobility charge 1 Mobility   Pre-Mobility: 93 HR Post-Mobility: 103 HR, 96% SpO2  Pt received in the chair and agreeable to ambulation. C/o back pain during session, otherwise asymptomatic. Pt back to the chair after session with call bell at her side and family present in the room.   [facility] Shantele Reller Mobility Specialist Mobility Specialist [address]: [phone]

## 2021-09-15 NOTE — Progress Notes (Signed)
Progress Note  Patient Name: Kristine [name] Date of Encounter: 09/16/2021  [facility] Cardiologist: Kristeen Miss, MD   Subjective   Patient feels much better this morning. No chest pain or SOB. LE edema improving.   Cr 2.11>2.19 Net negative 2800 Wt 291>288lbs  Inpatient Medications    Scheduled Meds:  allopurinol  300 mg Oral q AM   DULoxetine  30 mg Oral q AM   insulin aspart  0-9 Units Subcutaneous TID WC   insulin glargine-yfgn  25 Units Subcutaneous BID   levothyroxine  88 mcg Oral q morning   metoprolol succinate  12.5 mg Oral Daily   nystatin cream   Topical BID   polyethylene glycol  17 g Oral BID   pregabalin  50 mg Oral BID   Rivaroxaban  15 mg Oral Daily   sodium chloride flush  3 mL Intravenous Q12H   Continuous Infusions:  sodium chloride 250 mL (09/15/21 2307)   furosemide 100 mg (09/16/21 1038)   PRN Meds: sodium chloride, acetaminophen **OR** acetaminophen, ALPRAZolam, bisacodyl, HYDROcodone-acetaminophen, sodium chloride flush, traMADol   Vital Signs    Vitals:   09/15/21 1931 09/16/21 0159 09/16/21 0401 09/16/21 1039  BP: 99/64  (!) 124/55 112/69  Pulse: 80  99 98  Resp: 18  18   Temp: (!) 97.5 F (36.4 C)  (!) 97.5 F (36.4 C)   TempSrc: Oral  Oral   SpO2: 95%  95%   Weight:  130.8 kg    Height:        Intake/Output Summary (Last 24 hours) at 09/16/2021 1050 Last data filed at 09/16/2021 1610 Gross per 24 hour  Intake 600 ml  Output 3400 ml  Net -2800 ml      09/16/2021    1:59 AM 09/15/2021    6:58 AM 09/14/2021    9:45 AM  Last 3 Weights  Weight (lbs) 288 lb 6.4 oz 291 lb 12.8 oz 296 lb 8.3 oz  Weight (kg) 130.817 kg 132.36 kg 134.5 kg      Telemetry    Afib, rate controlled. Occasional PVCs- Personally Reviewed  ECG    No new tracing - Personally Reviewed  Physical Exam   GEN: Laying in bed, comfortabale Neck: JVD to mid-neck Cardiac: Irregular, 2/6 systolic murmur Respiratory: Clear to auscultation  bilaterally. GI: Obese, soft MS: 1-2+ pitting edema, warm Neuro:  Nonfocal  Psych: Normal affect   Labs    High Sensitivity Troponin:   Recent Labs  Lab 09/11/21 1730 09/11/21 2038  TROPONINIHS 51* 51*     Chemistry Recent Labs  Lab 09/11/21 1730 09/12/21 0247 09/12/21 2304 09/14/21 0206 09/15/21 0359 09/16/21 0226  NA 137 136   < > 137 139 139  K 3.0* 3.0*   < > 3.9 3.7 3.6  CL 97* 97*   < > 98 99 97*  CO2 28 27   < > 28 30 30   GLUCOSE 183* 115*   < > 234* 132* 154*  BUN 51* 51*   < > 48* 46* 47*  CREATININE 2.49* 2.32*   < > 2.22* 2.11* 2.19*  CALCIUM 9.8 9.3   < > 9.4 9.8 10.3  MG 2.7*  --   --   --   --   --   PROT 7.5 6.2*  --   --   --   --   ALBUMIN 4.1 3.5  --   --   --   --   AST 18 15  --   --   --   --  ALT 10 9  --   --   --   --   ALKPHOS 105 87  --   --   --   --   BILITOT 1.7* 1.5*  --   --   --   --   GFRNONAA 20* 22*   < > 23* 24* 23*  ANIONGAP 12 12   < > 11 10 12    < > = values in this interval not displayed.    Lipids No results for input(s): "CHOL", "TRIG", "HDL", "LABVLDL", "LDLCALC", "CHOLHDL" in the last 168 hours.  Hematology Recent Labs  Lab 09/11/21 1730 09/12/21 0247  WBC 10.4 10.1  RBC 4.29 3.88  HGB 12.7 11.4*  HCT 40.1 35.7*  MCV 93.5 92.0  MCH 29.6 29.4  MCHC 31.7 31.9  RDW 19.1* 19.1*  PLT 166 154   Thyroid  Recent Labs  Lab 09/12/21 0247  TSH 5.314*    BNP Recent Labs  Lab 09/11/21 1730  BNP 368.7*    DDimer No results for input(s): "DDIMER" in the last 168 hours.   Radiology    No results found.  Cardiac Studies   TTE 08/08/20: IMPRESSIONS   1. Left ventricular ejection fraction, by estimation, is 45 to 50%. The  left ventricle has mildly decreased function. Left ventricular endocardial  border not optimally defined to evaluate regional wall motion. Left  ventricular diastolic parameters are  indeterminate. There is the interventricular septum is flattened in  systole and diastole, consistent  with right ventricular pressure and  volume overload.   2. Right ventricular systolic function is severely reduced. The right  ventricular size is moderately enlarged. There is moderately elevated  pulmonary artery systolic pressure. The estimated right ventricular  systolic pressure is 54.7 mmHg.   3. Right atrial size was mildly dilated.   4. Moderate pericardial effusion. The pericardial effusion is posterior  to the left ventricle.   5. The mitral valve is abnormal. Mild to moderate mitral valve  regurgitation.   6. Tricuspid valve regurgitation is mild to moderate.   7. The aortic valve is tricuspid. There is moderate calcification of the  aortic valve. Aortic valve regurgitation is not visualized.   8. The inferior vena cava is dilated in size with <50% respiratory  variability, suggesting right atrial pressure of 15 mmHg.   Comparison(s): Echocardiogram done 06/04/18 showed an EF of 50-55%.   Patient Profile     76 y.o. female with history of persistent atrial fibrillation, non-ischemic cardiomyopathy, chronic systolic CHF, CKD, DM, HTN, hypothyroidism, HLD and sleep apnea who presented with worsening LE edema and shortness of breath consistent with acute on chronic biventricular heart failure exacerbation for which Cardiology was consulted.   Assessment & Plan    #Acute on Chronic Right Sided HF: #Acute on Chronic Heart Failure Improved EF: Patient with mainly right sided symptoms in the setting of severe RV enlargement and severe RV dysfunction likely triggered by down-titration of diuretics as outpatient. Also with history of mildly reduced EF that improved to 60-65% on most recent TTE. Responding better to IV diuresis + metolazone. Wt down from 298>288lbs -Continue lasix 100mg  IV TID -Re-dose metolazone 5mg  today -Continue metop 12.5mg  XL daily  -Monitor I/Os and daily weights  #Persistent Afib: Overall well rate controlled.  -Continue metop 12.5mg  XL daily -Continue  xarelto 15mg  daily  #HTN: -Continue metop 12.5mg  XL daily   #CKDIV: -Monitor with diuresis     For questions or updates, please contact [facility] Please  consult [email or web address] for contact info under        Signed, Meriam Sprague, MD  09/16/2021, 10:50 AM

## 2021-09-15 NOTE — TOC Progression Note (Signed)
Transition of Care [facility]) - Progression Note    Patient Details  Name: Kristine [name] MRN: [id number] Date of Birth: [birth year]  [facility] CM/SW Contact  Leone Haven, RN Phone Number: 09/15/2021, 4:06 PM  Clinical Narrative:    She is set up with [facility] for [facility] services. TOC will continue to follow for dc needs.   Expected Discharge Plan: Home w Home Health Services Barriers to Discharge: Continued Medical Work up  Expected Discharge Plan and Services Expected Discharge Plan: Home w Home Health Services In-house Referral: NA Discharge Planning Services: CM Consult Post Acute Care Choice: Home Health Living arrangements for the past 2 months: Single Family Home                   DME Agency: NA       HH Arranged: RN, OT, PT, Disease Management HH Agency: [facility] Date [facility] Agency Contacted: 09/12/21 Time HH Agency Contacted: 1316 Representative spoke with at [facility] Agency: [place] [facility]   Social Determinants of Health (SDOH) Interventions Food Insecurity Interventions: Intervention Not Indicated Financial Strain Interventions: Other (Comment) (Medication cost concerns) Housing Interventions: Intervention Not Indicated Transportation Interventions: Intervention Not Indicated  Readmission Risk Interventions    09/12/2021   12:58 PM  Readmission Risk Prevention Plan  Transportation Screening Complete  PCP or Specialist Appt within 3-5 Days Complete  Social Work Consult for Recovery Care Planning/Counseling Complete  Palliative Care Screening Not Applicable  Medication Review [employer]) Complete

## 2021-09-15 NOTE — Progress Notes (Signed)
Progress Note  Patient Name: Kristine [name] Date of Encounter: 09/15/2021  [facility] Cardiologist: Kristeen Miss, MD   Subjective   Feels better today. States LE edema is improving. No chest pain or SOB.  Cr 2.22>2.11 Net negative 3.3L after receiving dose of metolazone Wt 296>291lbs  Inpatient Medications    Scheduled Meds:  allopurinol  300 mg Oral q AM   DULoxetine  30 mg Oral q AM   insulin aspart  0-9 Units Subcutaneous TID WC   insulin glargine-yfgn  25 Units Subcutaneous BID   levothyroxine  88 mcg Oral q morning   metoprolol succinate  12.5 mg Oral Daily   nystatin cream   Topical BID   polyethylene glycol  17 g Oral BID   pregabalin  50 mg Oral BID   Rivaroxaban  15 mg Oral Daily   sodium chloride flush  3 mL Intravenous Q12H   Continuous Infusions:  sodium chloride     furosemide 100 mg (09/15/21 0908)   PRN Meds: sodium chloride, acetaminophen **OR** acetaminophen, ALPRAZolam, bisacodyl, HYDROcodone-acetaminophen, sodium chloride flush, traMADol   Vital Signs    Vitals:   09/14/21 0945 09/14/21 1937 09/15/21 0658 09/15/21 0730  BP: 126/74 135/87  114/68  Pulse: 87   92  Resp: 18 20  18   Temp: 97.6 F (36.4 C) (!) 97.4 F (36.3 C)  97.6 F (36.4 C)  TempSrc: Oral Oral  Oral  SpO2: 91%   93%  Weight: 134.5 kg  132.4 kg   Height:        Intake/Output Summary (Last 24 hours) at 09/15/2021 0945 Last data filed at 09/15/2021 0819 Gross per 24 hour  Intake 605 ml  Output 3550 ml  Net -2945 ml       09/15/2021    6:58 AM 09/14/2021    9:45 AM 09/13/2021    5:31 AM  Last 3 Weights  Weight (lbs) 291 lb 12.8 oz 296 lb 8.3 oz 294 lb 5 oz  Weight (kg) 132.36 kg 134.5 kg 133.5 kg      Telemetry    Afib, rate controlled. Occasional PVCs- Personally Reviewed  ECG    No new tracing today  - Personally Reviewed  Physical Exam   GEN: No acute distress.  Sitting in bed Neck: JVD to angle of mandible Cardiac: Irregular, 2/6 systolic  murmur Respiratory: Clear to auscultation bilaterally. GI: Obese, soft MS: 1-2+ pitting edema, warm Neuro:  Nonfocal  Psych: Normal affect   Labs    High Sensitivity Troponin:   Recent Labs  Lab 09/11/21 1730 09/11/21 2038  TROPONINIHS 51* 51*      Chemistry Recent Labs  Lab 09/11/21 1730 09/12/21 0247 09/12/21 2304 09/14/21 0206 09/15/21 0359  NA 137 136 139 137 139  K 3.0* 3.0* 5.1 3.9 3.7  CL 97* 97* 102 98 99  CO2 28 27 24 28 30   GLUCOSE 183* 115* 206* 234* 132*  BUN 51* 51* 49* 48* 46*  CREATININE 2.49* 2.32* 2.21* 2.22* 2.11*  CALCIUM 9.8 9.3 9.5 9.4 9.8  MG 2.7*  --   --   --   --   PROT 7.5 6.2*  --   --   --   ALBUMIN 4.1 3.5  --   --   --   AST 18 15  --   --   --   ALT 10 9  --   --   --   ALKPHOS 105 87  --   --   --  BILITOT 1.7* 1.5*  --   --   --   GFRNONAA 20* 22* 23* 23* 24*  ANIONGAP 12 12 13 11 10      Lipids No results for input(s): "CHOL", "TRIG", "HDL", "LABVLDL", "LDLCALC", "CHOLHDL" in the last 168 hours.  Hematology Recent Labs  Lab 09/11/21 1730 09/12/21 0247  WBC 10.4 10.1  RBC 4.29 3.88  HGB 12.7 11.4*  HCT 40.1 35.7*  MCV 93.5 92.0  MCH 29.6 29.4  MCHC 31.7 31.9  RDW 19.1* 19.1*  PLT 166 154    Thyroid  Recent Labs  Lab 09/12/21 0247  TSH 5.314*     BNP Recent Labs  Lab 09/11/21 1730  BNP 368.7*     DDimer No results for input(s): "DDIMER" in the last 168 hours.   Radiology    No results found.  Cardiac Studies   TTE 08/08/20: IMPRESSIONS     1. Left ventricular ejection fraction, by estimation, is 45 to 50%. The  left ventricle has mildly decreased function. Left ventricular endocardial  border not optimally defined to evaluate regional wall motion. Left  ventricular diastolic parameters are  indeterminate. There is the interventricular septum is flattened in  systole and diastole, consistent with right ventricular pressure and  volume overload.   2. Right ventricular systolic function is  severely reduced. The right  ventricular size is moderately enlarged. There is moderately elevated  pulmonary artery systolic pressure. The estimated right ventricular  systolic pressure is 54.7 mmHg.   3. Right atrial size was mildly dilated.   4. Moderate pericardial effusion. The pericardial effusion is posterior  to the left ventricle.   5. The mitral valve is abnormal. Mild to moderate mitral valve  regurgitation.   6. Tricuspid valve regurgitation is mild to moderate.   7. The aortic valve is tricuspid. There is moderate calcification of the  aortic valve. Aortic valve regurgitation is not visualized.   8. The inferior vena cava is dilated in size with <50% respiratory  variability, suggesting right atrial pressure of 15 mmHg.   Comparison(s): Echocardiogram done 06/04/18 showed an EF of 50-55%.   Patient Profile     76 y.o. female with history of persistent atrial fibrillation, non-ischemic cardiomyopathy, chronic systolic CHF, CKD, DM, HTN, hypothyroidism, HLD and sleep apnea who presented with worsening LE edema and shortness of breath consistent with acute on chronic biventricular heart failure exacerbation for which Cardiology was consulted.   Assessment & Plan    #Acute on Chronic Right Sided HF: #Acute on Chronic Heart Failure Improved EF: Patient with mainly right sided symptoms in the setting of severe RV enlargement and severe RV dysfunction likely triggered by down-titration of diuretics as outpatient. Also with history of mildly reduced EF that improved to 60-65% on most recent TTE. Responding better to IV diuresis + metolazone. Will re-dose metolazone today. -Continue lasix 100mg  IV TID -Will give additional metolazone 5mg  today and monitor response -Continue metop 12.5mg  XL daily  -Monitor I/Os and daily weights  #Persistent Afib: Overall well rate controlled.  -Continue metop 12.5mg  XL daily -Continue xarelto 15mg  daily  #HTN: -Continue metop 12.5mg  XL daily    #CKDIV: -Monitor with diuresis     For questions or updates, please contact [facility] Please consult [email or web address] for contact info under        Signed, Meriam Sprague, MD  09/15/2021, 9:45 AM

## 2021-09-16 DIAGNOSIS — E1142 Type 2 diabetes mellitus with diabetic polyneuropathy: Secondary | ICD-10-CM | POA: Diagnosis not present

## 2021-09-16 DIAGNOSIS — N184 Chronic kidney disease, stage 4 (severe): Secondary | ICD-10-CM | POA: Diagnosis not present

## 2021-09-16 DIAGNOSIS — I50811 Acute right heart failure: Secondary | ICD-10-CM | POA: Diagnosis not present

## 2021-09-16 DIAGNOSIS — I4819 Other persistent atrial fibrillation: Secondary | ICD-10-CM | POA: Diagnosis not present

## 2021-09-16 DIAGNOSIS — I10 Essential (primary) hypertension: Secondary | ICD-10-CM | POA: Diagnosis not present

## 2021-09-16 DIAGNOSIS — I5043 Acute on chronic combined systolic (congestive) and diastolic (congestive) heart failure: Secondary | ICD-10-CM | POA: Diagnosis not present

## 2021-09-16 DIAGNOSIS — I5041 Acute combined systolic (congestive) and diastolic (congestive) heart failure: Secondary | ICD-10-CM | POA: Diagnosis not present

## 2021-09-16 LAB — BASIC METABOLIC PANEL
Anion gap: 12 (ref 5–15)
BUN: 47 mg/dL — ABNORMAL HIGH (ref 8–23)
CO2: 30 mmol/L (ref 22–32)
Calcium: 10.3 mg/dL (ref 8.9–10.3)
Chloride: 97 mmol/L — ABNORMAL LOW (ref 98–111)
Creatinine, Ser: 2.19 mg/dL — ABNORMAL HIGH (ref 0.44–1.00)
GFR, Estimated: 23 mL/min — ABNORMAL LOW (ref >60)
Glucose, Bld: 154 mg/dL — ABNORMAL HIGH (ref 70–99)
Potassium: 3.6 mmol/L (ref 3.5–5.1)
Sodium: 139 mmol/L (ref 135–145)

## 2021-09-16 LAB — GLUCOSE, CAPILLARY
Glucose-Capillary: 206 mg/dL — ABNORMAL HIGH (ref 70–99)
Glucose-Capillary: 231 mg/dL — ABNORMAL HIGH (ref 70–99)
Glucose-Capillary: 247 mg/dL — ABNORMAL HIGH (ref 70–99)
Glucose-Capillary: 254 mg/dL — ABNORMAL HIGH (ref 70–99)

## 2021-09-16 MED ORDER — POTASSIUM CHLORIDE CRYS ER 20 MEQ PO TBCR
20.0000 meq | EXTENDED_RELEASE_TABLET | Freq: Once | ORAL | Status: AC
Start: 2021-09-16 — End: 2021-09-16
  Administered 2021-09-16: 20 meq via ORAL
  Filled 2021-09-16: qty 1

## 2021-09-16 MED ORDER — INSULIN GLARGINE-YFGN 100 UNIT/ML ~~LOC~~ SOLN
30.0000 [IU] | Freq: Two times a day (BID) | SUBCUTANEOUS | Status: DC
Start: 2021-09-16 — End: 2021-09-19
  Administered 2021-09-16 – 2021-09-19 (×6): 30 [IU] via SUBCUTANEOUS
  Filled 2021-09-16 (×7): qty 0.3

## 2021-09-16 MED ORDER — METOLAZONE 5 MG PO TABS
5.0000 mg | ORAL_TABLET | Freq: Once | ORAL | Status: AC
  Administered 2021-09-16: 5 mg via ORAL
  Filled 2021-09-16: qty 1

## 2021-09-16 MED ORDER — POTASSIUM CHLORIDE CRYS ER 20 MEQ PO TBCR
40.0000 meq | EXTENDED_RELEASE_TABLET | Freq: Once | ORAL | Status: AC
  Administered 2021-09-16: 40 meq via ORAL
  Filled 2021-09-16: qty 2

## 2021-09-16 NOTE — Progress Notes (Signed)
Physical Therapy Treatment Patient Details Name: Kristine [name] MRN: [id number] DOB: 19-Jan-1946 Today's Date: 09/16/2021   History of Present Illness 76 y.o. female presents to [facility] hospital on 09/11/2021 with worsening edema and SOB after her diuretic was recently reduced. Admitted 09/11/2021 for CHF exacerbation. PMH includes CKD, afib, COPD, GERD, HTN, DMII, PAH, Charcot's foot.    PT Comments    Patient agreeable to PT and is making progress towards meeting goals. She required minimal assistance for standing multiple bouts from various surfaces. Min guard provided for ambulation using rolling walker with occasional cues for safety. Overall activity tolerance is limited by fatigue. Recommend to continue PT to maximize independence and decrease caregiver burden.    Recommendations for follow up therapy are one component of a multi-disciplinary discharge planning process, led by the attending physician.  Recommendations may be updated based on patient status, additional functional criteria and insurance authorization.  Follow Up Recommendations  Home health PT     Assistance Recommended at Discharge Intermittent Supervision/Assistance  Patient can return home with the following A little help with walking and/or transfers;A lot of help with bathing/dressing/bathroom;Assist for transportation;Help with stairs or ramp for entrance   Equipment Recommendations  None recommended by PT    Recommendations for Other Services       Precautions / Restrictions Precautions Precautions: Fall Restrictions Weight Bearing Restrictions: No     Mobility  Bed Mobility Overal bed mobility: Needs Assistance Bed Mobility: Supine to Sit     Supine to sit: Min assist     General bed mobility comments: trunk support provided to sit upright. verbal cues for technique. increased time required to complete tasks    Transfers Overall transfer level: Needs assistance Equipment used: Rolling  walker (2 wheels) Transfers: Sit to/from Stand Sit to Stand: Min assist           General transfer comment: lifting assistance required with standing from bed and from toilet. verbal cues for technique and anterior weight shifting    Ambulation/Gait Ambulation/Gait assistance: Min guard Gait Distance (Feet): 20 Feet Assistive device: Rolling walker (2 wheels) Gait Pattern/deviations: Step-to pattern, Decreased stride length, Trunk flexed, Narrow base of support Gait velocity: decreased     General Gait Details: occasional cues required to stand closer to rolling walker for support. Min guard provided for safety. no significant change in vitals noted with activity   Stairs             Wheelchair Mobility    Modified Rankin (Stroke Patients Only)       Balance Overall balance assessment: Needs assistance Sitting-balance support: Feet supported Sitting balance-Leahy Scale: Fair Sitting balance - Comments: patient able to perform peri-care at toilet without loss of balance. set-up provided for toilet paper   Standing balance support: Bilateral upper extremity supported, Reliant on assistive device for balance Standing balance-Leahy Scale: Poor Standing balance comment: external support required with rolling walker                            Cognition Arousal/Alertness: Awake/alert Behavior During Therapy: WFL for tasks assessed/performed Overall Cognitive Status: Within Functional Limits for tasks assessed                                 General Comments: patient was sleeping soundly on arrival to the room. she wakes up to light touch and voice. she was  able to follow single step commands consistently        Exercises      General Comments General comments (skin integrity, edema, etc.): patient fatigued with activity. she requested to remain sitting up on edge of bed with lunch tray set-up and supportive spouse at the bedside       Pertinent Vitals/Pain Pain Assessment Pain Assessment: No/denies pain    Home Living                          Prior Function            PT Goals (current goals can now be found in the care plan section) Acute Rehab PT Goals Patient Stated Goal: to return home PT Goal Formulation: With patient Time For Goal Achievement: 09/26/21 Potential to Achieve Goals: Good Progress towards PT goals: Progressing toward goals    Frequency    Min 3X/week      PT Plan Current plan remains appropriate    Co-evaluation              AM-PAC PT "6 Clicks" Mobility   Outcome Measure  Help needed turning from your back to your side while in a flat bed without using bedrails?: A Little Help needed moving from lying on your back to sitting on the side of a flat bed without using bedrails?: A Little Help needed moving to and from a bed to a chair (including a wheelchair)?: A Little Help needed standing up from a chair using your arms (e.g., wheelchair or bedside chair)?: A Little Help needed to walk in hospital room?: A Little Help needed climbing 3-5 steps with a railing? : A Lot 6 Click Score: 17    End of Session   Activity Tolerance: Patient tolerated treatment well Patient left: with call bell/phone within reach;with bed alarm set;with family/visitor present (seated on bed) Nurse Communication: Mobility status PT Visit Diagnosis: Other abnormalities of gait and mobility (R26.89);Muscle weakness (generalized) (M62.81)     Time: 2956-2130 PT Time Calculation (min) (ACUTE ONLY): 36 min  Charges:  $Gait Training: 8-22 mins $Therapeutic Activity: 8-22 mins                     Donna Bernard, PT, MPT    Ina Homes 09/16/2021, 2:28 PM

## 2021-09-17 ENCOUNTER — Inpatient Hospital Stay (HOSPITAL_COMMUNITY): Payer: Medicare HMO

## 2021-09-17 DIAGNOSIS — I5041 Acute combined systolic (congestive) and diastolic (congestive) heart failure: Secondary | ICD-10-CM | POA: Diagnosis not present

## 2021-09-17 DIAGNOSIS — N184 Chronic kidney disease, stage 4 (severe): Secondary | ICD-10-CM | POA: Diagnosis not present

## 2021-09-17 DIAGNOSIS — I10 Essential (primary) hypertension: Secondary | ICD-10-CM | POA: Diagnosis not present

## 2021-09-17 DIAGNOSIS — I5043 Acute on chronic combined systolic (congestive) and diastolic (congestive) heart failure: Secondary | ICD-10-CM | POA: Diagnosis not present

## 2021-09-17 LAB — BASIC METABOLIC PANEL
Anion gap: 12 (ref 5–15)
BUN: 50 mg/dL — ABNORMAL HIGH (ref 8–23)
CO2: 31 mmol/L (ref 22–32)
Calcium: 10.1 mg/dL (ref 8.9–10.3)
Chloride: 96 mmol/L — ABNORMAL LOW (ref 98–111)
Creatinine, Ser: 2.22 mg/dL — ABNORMAL HIGH (ref 0.44–1.00)
GFR, Estimated: 23 mL/min — ABNORMAL LOW (ref >60)
Glucose, Bld: 212 mg/dL — ABNORMAL HIGH (ref 70–99)
Potassium: 3.6 mmol/L (ref 3.5–5.1)
Sodium: 139 mmol/L (ref 135–145)

## 2021-09-17 LAB — URINALYSIS, ROUTINE W REFLEX MICROSCOPIC
Bilirubin Urine: NEGATIVE
Glucose, UA: NEGATIVE mg/dL
Hgb urine dipstick: NEGATIVE
Ketones, ur: NEGATIVE mg/dL
Leukocytes,Ua: NEGATIVE
Nitrite: NEGATIVE
Protein, ur: NEGATIVE mg/dL
Specific Gravity, Urine: 1.008 (ref 1.005–1.030)
pH: 6 (ref 5.0–8.0)

## 2021-09-17 LAB — GLUCOSE, CAPILLARY
Glucose-Capillary: 224 mg/dL — ABNORMAL HIGH (ref 70–99)
Glucose-Capillary: 254 mg/dL — ABNORMAL HIGH (ref 70–99)
Glucose-Capillary: 240 mg/dL — ABNORMAL HIGH (ref 70–99)
Glucose-Capillary: 196 mg/dL — ABNORMAL HIGH (ref 70–99)

## 2021-09-17 LAB — CBC
HCT: 35.1 % — ABNORMAL LOW (ref 36.0–46.0)
Hemoglobin: 11.6 g/dL — ABNORMAL LOW (ref 12.0–15.0)
MCH: 30.1 pg (ref 26.0–34.0)
MCHC: 33 g/dL (ref 30.0–36.0)
MCV: 90.9 fL (ref 80.0–100.0)
Platelets: 145 10*3/uL — ABNORMAL LOW (ref 150–400)
RBC: 3.86 MIL/uL — ABNORMAL LOW (ref 3.87–5.11)
RDW: 19.3 % — ABNORMAL HIGH (ref 11.5–15.5)
WBC: 24.3 10*3/uL — ABNORMAL HIGH (ref 4.0–10.5)
nRBC: 0 % (ref 0.0–0.2)

## 2021-09-17 LAB — MAGNESIUM: Magnesium: 2.6 mg/dL — ABNORMAL HIGH (ref 1.7–2.4)

## 2021-09-17 MED ORDER — METOLAZONE 5 MG PO TABS
5.0000 mg | ORAL_TABLET | Freq: Once | ORAL | Status: AC
Start: 2021-09-17 — End: 2021-09-17
  Administered 2021-09-17: 5 mg via ORAL
  Filled 2021-09-17: qty 1

## 2021-09-17 MED ORDER — METOPROLOL TARTRATE 12.5 MG HALF TABLET
12.5000 mg | ORAL_TABLET | Freq: Once | ORAL | Status: AC
Start: 2021-09-17 — End: 2021-09-17
  Administered 2021-09-17: 12.5 mg via ORAL
  Filled 2021-09-17: qty 1

## 2021-09-17 MED ORDER — METOPROLOL SUCCINATE ER 25 MG PO TB24
25.0000 mg | ORAL_TABLET | Freq: Every day | ORAL | Status: DC
  Administered 2021-09-18 – 2021-09-19 (×2): 25 mg via ORAL
  Filled 2021-09-17 (×2): qty 1

## 2021-09-17 MED ORDER — POTASSIUM CHLORIDE CRYS ER 20 MEQ PO TBCR
40.0000 meq | EXTENDED_RELEASE_TABLET | ORAL | Status: AC
  Administered 2021-09-17 (×2): 40 meq via ORAL
  Filled 2021-09-17 (×2): qty 2

## 2021-09-17 NOTE — Progress Notes (Signed)
   09/17/21 1318  Assess: MEWS Score  Temp 97.7 F (36.5 C)  BP (!) 159/89  MAP (mmHg) 110  Pulse Rate (!) 128  Resp 18  Level of Consciousness Alert  SpO2 96 %  O2 Device Room Air  Patient Activity (if Appropriate) In bed  Assess: MEWS Score  MEWS Temp 0  MEWS Systolic 0  MEWS Pulse 2  MEWS RR 0  MEWS LOC 0  MEWS Score 2  MEWS Score Color Yellow  Assess: if the MEWS score is Yellow or Red  Were vital signs taken at a resting state? Yes  Focused Assessment No change from prior assessment  Does the patient meet 2 or more of the SIRS criteria? No  MEWS guidelines implemented *See Row Information* Yes  Treat  MEWS Interventions Administered prn meds/treatments  Pain Scale 0-10  Pain Score 0  Early Detection of Sepsis Score *See Row Information* Medium  Take Vital Signs  Increase Vital Sign Frequency  Yellow: Q 2hr X 2 then Q 4hr X 2, if remains yellow, continue Q 4hrs  Escalate  MEWS: Escalate Yellow: discuss with charge nurse/RN and consider discussing with provider and RRT  Notify: Charge Nurse/RN  Name of Charge Nurse/RN Notified Beckey Rutter  Date Charge Nurse/RN Notified 09/17/21  Time Charge Nurse/RN Notified 16  Notify: Provider  Provider Name/Title Vikki Ports, PA  Date Provider Notified 09/17/21  Time Provider Notified 1320  Method of Notification Page  Notification Reason Other (Comment) (increased HR)  Provider response Other (Comment) (called RN)  Date of Provider Response 09/17/21  Time of Provider Response 1321  Assess: SIRS CRITERIA  SIRS Temperature  0  SIRS Pulse 1  SIRS Respirations  0  SIRS WBC 0  SIRS Score Sum  1   At 1310, RN was notified that patient's HR was in the 130s. Upon assessment, patient stated that she was not having any chest pain or shortness of breath. She stated that she was "just cold". Provided patient with warm blankets and checked vital signs. Cardiology PA was paged and charge RN was notified.  PA called RN, new  orders given.  Medication administered.  Patient resting in bed with call bell within reach.

## 2021-09-17 NOTE — Progress Notes (Signed)
PT Cancellation Note  Patient Details Name: Kristine [name] MRN: [id number] DOB: 1945/10/12   Cancelled Treatment:    Reason Eval/Treat Not Completed: Medical issues which prohibited therapy, pt HR sustained tachy/afib 111-143bpm at rest, spoke with RN, advised to hold session at this time. Will check back as schedule allows to continue with PT POC.    Rutherford Limerick 09/17/2021, 3:36 PM

## 2021-09-17 NOTE — Progress Notes (Signed)
Progress Note   Patient: Kristine [name] [id number]:[id number] DOB: Sep 20, 1945 DOA: 09/11/2021     6 DOS: the patient was seen and examined on 09/17/2021   Brief hospital course: 76 year old woman presented with shortness of breath and edema.  In the last month diuretics reduced by nephrology team.  Metolazone was stopped.  Admitted for acute CHF, management per cardiology, has been improving daily.  On 09/17/2021 patient reports having some chills, and experiencing tachycardia no relief by the use of Lopressor.  Urinalysis and chest x-ray has been ordered.  No other complaints suggested source of infection.  Patient currently afebrile.  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 LVEF 96-78%, RV systolic function severely reduced. Compared to study 07/2020, LVEF has improved; RV without change; pericardial effusion resolved --UOP excellent, creatinine stable over baseline, continues to diurese well. --continue IV furosemide; metoprolol and added metolazone per cardiology rec's - No ACE/ARB/ARNI with advanced CKD - Consider restarting spironolactone prior to discharge if renal function allows - eGFR <25 - cannot add Wilder Glade  -further GDTM per cardiology.   CKD stage IV --creatinine stable; follow BMP --continue to follow trend   Atrial fibrillation --continue anticoagulation  --consider change in agent if spontaneous mouth bleeding recurs (defer to PCP) --continue metoprolol; dose adjustments and further rate control agents as per cardiology service.   DM type 2 with Charcot foot, peripheral neuropathy --CBG still high, will increase basal insulin further --SSI   Cutaneous candidiasis --continue nystatin   Spontaneous bleeding from mouth --present 1 month but resolved spontaneously in hospital. Has been seen by ENT and dental medicine; no cause found; consider change to another agent; PCP has referred to hematology; will  continue to monitor for now. --Follow-up as an outpatient   Morbid obesity Body mass index is 41.81 kg/m. --Low calorie diet, portion control and increasing activity discussed with patient.   LE wound --left foot, plantar aspect chronic, nonhealing, neuropathic lesion. She has a known charcot foot deformity. She follows for the ulcer noted today with her podiatrist, Dr. Demetra Shiner at [facility]. Her last visit was January 3 of this year.She wears a surgical shoe on the left foot regularly to offload the ulcer site.Silver sulfadiazine has been in use. Wound care per RN. Recommend return visit to the podiatrist provider above post discharge. Recommend continuation of offloading shoe use when ambulating. -No significant signs of superimposed infection appreciated. -Continue supportive care.  Chills -With concern for underlying infection given new uncontrolled tachycardia process. -Urinalysis and chest x-ray will be check -WBCs 24,000; possible in the setting of continue diuresis and intravascular hemoconcentration. -holding abx's currently  Subjective: Reports chills, no chest pain, no nausea, no vomiting.  No palpitations.   Physical Exam: Vitals:   09/17/21 1311 09/17/21 1318 09/17/21 1432 09/17/21 1638  BP: (!) 159/89 (!) 159/89 137/81 (!) 106/92  Pulse: (!) 121 (!) 128  (!) 117  Resp: $Remo'18 18  20  'qmQig$ Temp: 97.7 F (36.5 C) 97.7 F (36.5 C)  99.5 F (37.5 C)  TempSrc: Oral   Oral  SpO2: 96% 96%    Weight:      Height:       General exam: Alert, awake, oriented x 3; slightly anxious.  No fever, no nausea vomiting. Respiratory system: Good saturation on room air; no using accessory muscles.  Decreased breath sounds at the bases. Cardiovascular system: Irregular; no rubs, no gallops, unable to properly assess JVD due to body habitus. Gastrointestinal  system: Abdomen is obese, nondistended, soft and nontender. No organomegaly or masses felt. Normal bowel sounds  heard. Central nervous system: Alert and oriented. No focal neurological deficits. Extremities: No cyanosis or clubbing; trace to 1+ edema bilaterally. Skin: No petechiae. Psychiatry: Judgement and insight appear normal. Mood & affect appropriate.    Data Reviewed:  -66 I/O - 7.9L  CBG stable  K+ 3.6  HHPT, OT  Family Communication: husband at bedside  Disposition: Status is: Inpatient Remains inpatient appropriate because: volume overload, CHF and now with concerns for underlying infection.   Planned Discharge Destination: Home with Home Health PT OT Time spent: 20 minutes  Author: Barton Dubois, MD 09/17/2021 5:25 PM  For on call review [email or web address].

## 2021-09-17 NOTE — Progress Notes (Signed)
Heart Failure Stewardship Pharmacist Progress Note   PCP: Prince Solian, MD PCP-Cardiologist: Mertie Moores, MD    HPI:  76 yo F with PMH of afib, NICM, CHF, T2DM, HTN, HLD, hypothyroidism, and sleep apnea. She presented to the ED on 6/22 with shortness of breath, LE edema, orthopnea, and lightheadedness. Recently, her outpatient nephrologist stopped her PTA metolazone due to worsening renal function but she had worsening edema because of this. CXR with mild to moderate cardiomegaly with mild pulmonary vascular congestion. An ECHO was done on 6/23 and LVEF 55-60% with mild LVH, severely reduced RV, and mild AS. Compared to her last ECHO in 07/2020, LVEF somewhat improved from 45-50%. She has had notable RV systolic dysfunction since her ECHO in 05/2018.  Current HF Medications: Diuretic: furosemide 100 mg IV TID Beta Blocker: metoprolol XL 12.5 mg daily  Prior to admission HF Medications: Diuretic: furosemide 120 mg BID Beta blocker: metoprolol tartrate 25 mg qhs Aldosterone Antagonist: spironolactone 25 mg daily  Pertinent Lab Values: Serum creatinine 2.22, BUN 50, Potassium 3.6, Sodium 139, BNP 368.7, Magnesium 2.7, A1c 7.0   Vital Signs: Weight: 283 lbs (admission weight: 298 lbs) Blood pressure: 110/60s  Heart rate: 90s - afib  I/O: -2.8L yesterday; net -11.2L  Medication Assistance / Insurance Benefits Check: Does the patient have prescription insurance?  Yes Type of insurance plan: [place] [employer]  Outpatient Pharmacy:  Prior to admission outpatient pharmacy: [facility] Is the patient willing to use [facility] at discharge? Yes Is the patient willing to transition their outpatient pharmacy to utilize a [facility] outpatient pharmacy?   Pending    Assessment: 1. Acute on chronic diastolic CHF (LVEF 62-37%). NYHA class III symptoms. Symptoms due to severe RV dysfunction and de-escalation of diuretics as outpatient. - Still fluid overloaded on exam. Continue  furosemide 100 mg IV TID. K replaced with 40 mEq x 2. Recommend resuming metolazone at discharge - 2.5 mg three times weekly (MWF). - Continue metoprolol XL 12.5 mg daily - No ACE/ARB/ARNI with advanced CKD - Consider restarting spironolactone prior to discharge if renal function allows - eGFR <25 - cannot add Farxiga  - Consider starting Jardiance 10 mg daily if eGFR remains >20   Plan: 1) Medication changes recommended at this time: - Continue IV diuresis - Consider re-dosing metolazone  2) Patient assistance: - Entresto copay $25 - Farxiga/Jardiance copay $25  3)  Education  - To be completed prior to discharge  Kerby Nora, PharmD, BCPS Heart Failure Stewardship Pharmacist Phone [phone]

## 2021-09-17 NOTE — Progress Notes (Addendum)
Progress Note  Patient Name: Vienna Folden Date of Encounter: 09/17/2021  [facility] Cardiologist: Mertie Moores, MD   Subjective   Patient denies chest pain. Breathing has improved. Continues to have ankle edema, some mild orthopnea   Inpatient Medications    Scheduled Meds:  allopurinol  300 mg Oral q AM   DULoxetine  30 mg Oral q AM   insulin aspart  0-9 Units Subcutaneous TID WC   insulin glargine-yfgn  30 Units Subcutaneous BID   levothyroxine  88 mcg Oral q morning   metoprolol succinate  12.5 mg Oral Daily   nystatin cream   Topical BID   polyethylene glycol  17 g Oral BID   potassium chloride  40 mEq Oral Q4H   pregabalin  50 mg Oral BID   Rivaroxaban  15 mg Oral Daily   sodium chloride flush  3 mL Intravenous Q12H   Continuous Infusions:  sodium chloride 250 mL (09/15/21 2307)   furosemide 100 mg (09/17/21 1009)   PRN Meds: sodium chloride, acetaminophen **OR** acetaminophen, ALPRAZolam, bisacodyl, HYDROcodone-acetaminophen, sodium chloride flush, traMADol   Vital Signs    Vitals:   09/17/21 0300 09/17/21 0503 09/17/21 0813 09/17/21 0900  BP:  116/74 107/60 113/75  Pulse:  86 92 93  Resp:  '18 16 16  '$ Temp:  97.8 F (36.6 C) (!) 97.4 F (36.3 C) (!) 97.4 F (36.3 C)  TempSrc:  Oral Oral   SpO2:  95% 95% 95%  Weight: 128.4 kg     Height:        Intake/Output Summary (Last 24 hours) at 09/17/2021 1046 Last data filed at 09/17/2021 1027 Gross per 24 hour  Intake 480 ml  Output 3850 ml  Net -3370 ml      09/17/2021    3:00 AM 09/16/2021    1:59 AM 09/15/2021    6:58 AM  Last 3 Weights  Weight (lbs) 283 lb 1.6 oz 288 lb 6.4 oz 291 lb 12.8 oz  Weight (kg) 128.413 kg 130.817 kg 132.36 kg      Telemetry    Atrial fibrillation, HR in the 80s-90s - Personally Reviewed  ECG     No new tracings- Personally Reviewed  Physical Exam   GEN: No acute distress.  Laying flat in the bed comfortably  Neck:  Supple  Cardiac: Irregular rate and  rhythm, distant heart sounds  Respiratory: Crackles in lung bases, otherwise clear to ausculation  GI: Soft, nontender, non-distended  MS: 2+ edema in bilateral lower extremities  Neuro:  Nonfocal  Psych: Normal affect   Labs    High Sensitivity Troponin:   Recent Labs  Lab 09/11/21 1730 09/11/21 2038  TROPONINIHS 51* 51*     Chemistry Recent Labs  Lab 09/11/21 1730 09/12/21 0247 09/12/21 2304 09/15/21 0359 09/16/21 0226 09/17/21 0317  NA 137 136   < > 139 139 139  K 3.0* 3.0*   < > 3.7 3.6 3.6  CL 97* 97*   < > 99 97* 96*  CO2 28 27   < > '30 30 31  '$ GLUCOSE 183* 115*   < > 132* 154* 212*  BUN 51* 51*   < > 46* 47* 50*  CREATININE 2.49* 2.32*   < > 2.11* 2.19* 2.22*  CALCIUM 9.8 9.3   < > 9.8 10.3 10.1  MG 2.7*  --   --   --   --   --   PROT 7.5 6.2*  --   --   --   --  ALBUMIN 4.1 3.5  --   --   --   --   AST 18 15  --   --   --   --   ALT 10 9  --   --   --   --   ALKPHOS 105 87  --   --   --   --   BILITOT 1.7* 1.5*  --   --   --   --   GFRNONAA 20* 22*   < > 24* 23* 23*  ANIONGAP 12 12   < > '10 12 12   '$ < > = values in this interval not displayed.    Lipids No results for input(s): "CHOL", "TRIG", "HDL", "LABVLDL", "LDLCALC", "CHOLHDL" in the last 168 hours.  Hematology Recent Labs  Lab 09/11/21 1730 09/12/21 0247  WBC 10.4 10.1  RBC 4.29 3.88  HGB 12.7 11.4*  HCT 40.1 35.7*  MCV 93.5 92.0  MCH 29.6 29.4  MCHC 31.7 31.9  RDW 19.1* 19.1*  PLT 166 154   Thyroid  Recent Labs  Lab 09/12/21 0247  TSH 5.314*    BNP Recent Labs  Lab 09/11/21 1730  BNP 368.7*    DDimer No results for input(s): "DDIMER" in the last 168 hours.   Radiology    No results found.  Cardiac Studies   Echocardiogram 09/12/21  1. Left ventricular ejection fraction, by estimation, is 55 to 60%. The  left ventricle has normal function. The left ventricle has no regional  wall motion abnormalities. There is mild left ventricular hypertrophy.  Left ventricular diastolic  function  could not be evaluated. There is the interventricular septum is flattened  in diastole ('D' shaped left ventricle), consistent with right ventricular  volume overload.   2. Right ventricular systolic function is severely reduced. The right  ventricular size is severely enlarged. There is severely elevated  pulmonary artery systolic pressure.   3. The mitral valve is normal in structure. No evidence of mitral valve  regurgitation.   4. Tricuspid valve regurgitation is mild to moderate.   5. The aortic valve is calcified. Aortic valve regurgitation is not  visualized. Mild aortic valve stenosis.   6. The inferior vena cava is dilated in size with <50% respiratory  variability, suggesting right atrial pressure of 15 mmHg.   Patient Profile     76 y.o. female with a history of persistent afib, non-ischemic cardiomyopathy, chronic systolic heart failure, DKD, DM, HTN, Hypothyroidism, HLD, OSA who si being seen for worsening LE edema and SOB consistent with acute on chronic biventricular failure   Assessment & Plan    Acute on Chronic Right Sided HF: Acute on Chronic Heart Failure Improved EF Pulmonary HTN  - Patient with mainly right sided symptoms in the setting of severe RV enlargement and severe RV dysfunction likely triggered by down-titration of diuretics as outpatient. Also with history of mildly reduced EF that improved to 60-65% on most recent TTE. - Responding well to IV diuresis + metolazone. Wt down from 298>283lbs - Has been on IV lasix 100 mg TID and metolazone 5 mg-- output 3.85 L urine yesterday and is currently net -11 L since admission  - Creatinine stable at 2.22  - Continue lasix '100mg'$  IV TID - Re-dose metolazone 5 mg today  - Continue metop 12.'5mg'$  XL daily  - Other GDMT is limited by renal function  - Monitor I/Os and daily weights   Persistent Afib - Overall well rate controlled per telemetry  -Continue  metop 12.'5mg'$  XL daily -Continue xarelto '15mg'$   daily   HTN - Continue metop 12.'5mg'$  XL daily  - BP stable    CKD stage IV - Baseline creatinine around 2.6 -Monitor with diuresis - Avoid nephrotoxic medications      For questions or updates, please contact [facility] Please consult [email or web address] for contact info under        Signed, Margie Billet, PA-C  09/17/2021, 10:46 AM    Patient seen and examined and agree with Vikki Ports, PA-C as detailed above.   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  Currently, responding well to IV diuresis and metolazone. Wt down from 298 on admission to 283lbs today. LE edema improving but continues to be overloaded on exam. Dry wt per patient is ~277lbs. Renal function has remained stable with diuretics and will continue today.  GEN: No acute distress.  Laying in bed Neck: +JVD Cardiac: RRR, no murmurs, rubs, or gallops.  Respiratory: Clear to auscultation bilaterally. GI: Obese, soft MS: 1+ pitting edema, warm Neuro:  Nonfocal  Psych: Normal affect   Plan: -Continue lasix '100mg'$  IV TID and re-dose metolazone '5mg'$  today -Continue K supplementation with active diuresis -Continue metop 12.'5mg'$  daily and xarelto for [facility] Afib  -Dry Wt about 277lbs per patient report; currently at 283lbs  Plan discussed with patient and friends at bedside  Gwyndolyn Kaufman, MD

## 2021-09-17 NOTE — Progress Notes (Addendum)
   After giving additional dose of 12.5 mg metoprolol, patient continued to have a HR in the 120s-130s. Discussed with RN. Patient was found sitting on the side of her bed weeping. Was given a dose of her PRN ativan for anxiety. HR improved somewhat to the 110s-120s.   BP 106/92. There is very little room to further titrate metoprolol or add diltiazem. Patient has a history of amiodarone toxicity. Patient's creatinine 2.22 so I am hesitant to try digoxin. For now, continue to diurese patient to see if HR improves with volume status.   I also ordered a CBC on this patient as I noticed one had not been drawn since 6/23. WBC had increased from 10.1 up to 24.3. Question if there is a possible underlying infection that is making her HR difficult to control. Patient did complain of chills earlier today but is afebrile. Will defer to primary team, message sent to attending.   Margie Billet, PA-C 09/17/2021 5:14 PM

## 2021-09-17 NOTE — Progress Notes (Signed)
   I was notified by RN that patient's HR was consistently in the 120s-140s. BP 159/89. Patient denied chest pain or SOB. Patient had received toprol-XL 12.5 mg this AM. I asked the RN to give her an additional 12.5 mg now, and I increased patient's toprol-xl to 25 mg daily starting tomorrow.   Margie Billet, PA-C 09/17/2021 1:24 PM

## 2021-09-17 NOTE — Inpatient Diabetes Management (Signed)
Inpatient Diabetes Program Recommendations  AACE/ADA: New Consensus Statement on Inpatient Glycemic Control (2015)  Target Ranges:  Prepandial:   less than 140 mg/dL      Peak postprandial:   less than 180 mg/dL (1-2 hours)      Critically ill patients:  140 - 180 mg/dL   Lab Results  Component Value Date   GLUCAP 224 (H) 09/17/2021   HGBA1C 7.0 (H) 09/11/2021    Review of Glycemic Control  Latest Reference Range & Units 09/16/21 05:54 09/16/21 10:58 09/16/21 16:27 09/16/21 21:26 09/17/21 06:05  Glucose-Capillary 70 - 99 mg/dL 206 (H) 231 (H) 247 (H) 254 (H) 224 (H)  (H): Data is abnormally high  Diabetes history: DM2 Outpatient Diabetes medications: Tresiba 100 units BID, Novolog 40-70 TID Current orders for Inpatient glycemic control: Semglee 35 units BID, Novolog 0-9 units TID  Inpatient Diabetes Program Recommendations:    Semglee 40 units BID Novolog 3 units TID with meals if consumes at least 50%  Will continue to follow while inpatient.  Thank you, Reche Dixon, MSN, [place] [facility] Coordinator Inpatient Diabetes Program [phone] (team pager from 8a-5p)

## 2021-09-17 NOTE — Progress Notes (Signed)
Mobility Specialist Progress Note:   09/17/21 1500  Mobility  Activity Transferred to/from [facility]  Level of Assistance Moderate assist, patient does 50-74% (+2)  Assistive Device  (HHA)  Distance Ambulated (ft) 4 ft  Activity Response Tolerated fair  $Mobility charge 1 Mobility   Pt received EOB needing to use BSC. ModA+2to stand and step to [facility] and back to bed. Left in bed with call bell in reach and all needs met.   [facility] Kloie Whiting Mobility Specialist

## 2021-09-18 ENCOUNTER — Telehealth: Payer: Self-pay

## 2021-09-18 DIAGNOSIS — N184 Chronic kidney disease, stage 4 (severe): Secondary | ICD-10-CM | POA: Diagnosis not present

## 2021-09-18 DIAGNOSIS — I5043 Acute on chronic combined systolic (congestive) and diastolic (congestive) heart failure: Secondary | ICD-10-CM | POA: Diagnosis not present

## 2021-09-18 DIAGNOSIS — I10 Essential (primary) hypertension: Secondary | ICD-10-CM | POA: Diagnosis not present

## 2021-09-18 DIAGNOSIS — I5041 Acute combined systolic (congestive) and diastolic (congestive) heart failure: Secondary | ICD-10-CM | POA: Diagnosis not present

## 2021-09-18 LAB — CBC
HCT: 33.6 % — ABNORMAL LOW (ref 36.0–46.0)
Hemoglobin: 10.8 g/dL — ABNORMAL LOW (ref 12.0–15.0)
MCH: 29.6 pg (ref 26.0–34.0)
MCHC: 32.1 g/dL (ref 30.0–36.0)
MCV: 92.1 fL (ref 80.0–100.0)
Platelets: 150 10*3/uL (ref 150–400)
RBC: 3.65 MIL/uL — ABNORMAL LOW (ref 3.87–5.11)
RDW: 19.1 % — ABNORMAL HIGH (ref 11.5–15.5)
WBC: 19.7 10*3/uL — ABNORMAL HIGH (ref 4.0–10.5)
nRBC: 0 % (ref 0.0–0.2)

## 2021-09-18 LAB — GLUCOSE, CAPILLARY
Glucose-Capillary: 136 mg/dL — ABNORMAL HIGH (ref 70–99)
Glucose-Capillary: 184 mg/dL — ABNORMAL HIGH (ref 70–99)
Glucose-Capillary: 155 mg/dL — ABNORMAL HIGH (ref 70–99)
Glucose-Capillary: 120 mg/dL — ABNORMAL HIGH (ref 70–99)

## 2021-09-18 LAB — BASIC METABOLIC PANEL
Anion gap: 12 (ref 5–15)
BUN: 52 mg/dL — ABNORMAL HIGH (ref 8–23)
CO2: 31 mmol/L (ref 22–32)
Calcium: 9.8 mg/dL (ref 8.9–10.3)
Chloride: 96 mmol/L — ABNORMAL LOW (ref 98–111)
Creatinine, Ser: 2.38 mg/dL — ABNORMAL HIGH (ref 0.44–1.00)
GFR, Estimated: 21 mL/min — ABNORMAL LOW (ref >60)
Glucose, Bld: 126 mg/dL — ABNORMAL HIGH (ref 70–99)
Potassium: 3.4 mmol/L — ABNORMAL LOW (ref 3.5–5.1)
Sodium: 139 mmol/L (ref 135–145)

## 2021-09-18 LAB — PROCALCITONIN: Procalcitonin: 3.09 ng/mL

## 2021-09-18 MED ORDER — METOLAZONE 5 MG PO TABS
5.0000 mg | ORAL_TABLET | ORAL | Status: DC
  Administered 2021-09-19: 5 mg via ORAL
  Filled 2021-09-18 (×2): qty 1

## 2021-09-18 MED ORDER — FUROSEMIDE 40 MG PO TABS
120.0000 mg | ORAL_TABLET | Freq: Two times a day (BID) | ORAL | Status: DC
  Administered 2021-09-18 – 2021-09-19 (×2): 120 mg via ORAL
  Filled 2021-09-18 (×3): qty 3

## 2021-09-18 MED ORDER — POTASSIUM CHLORIDE CRYS ER 20 MEQ PO TBCR
40.0000 meq | EXTENDED_RELEASE_TABLET | Freq: Two times a day (BID) | ORAL | Status: AC
  Administered 2021-09-18 (×2): 40 meq via ORAL
  Filled 2021-09-18 (×2): qty 2

## 2021-09-18 NOTE — Progress Notes (Signed)
B Progress Note   Patient: Kristine [name] CHY:[id number] DOB: [birth year] DOA: 09/11/2021     7 DOS: the patient was seen and examined on 09/18/2021   Brief hospital course: 76 year old woman presented with shortness of breath and edema.  In the last month diuretics reduced by nephrology team.  Metolazone was stopped.  Admitted for acute CHF, management per cardiology, has been improving daily.  On 09/17/2021 patient reports having some chills, and experiencing tachycardia no relief by the use of Lopressor.  Urinalysis and chest x-ray has been ordered.  No other complaints suggested source of infection.  Patient currently afebrile.  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 LVEF 87-86%, RV systolic function severely reduced. Compared to study 07/2020, LVEF has improved; RV without change; pericardial effusion resolved -- Continue to follow urine output and renal function. -- After discussing with cardiology service plan is to transition diuretics to oral route and assess ability to keep negative balance increase urine output and renal function at ability prior to discharge. - No ACE/ARB/ARNI with advanced CKD - Consider restarting spironolactone prior to discharge if renal function allows; otherwise further adjustment to medications to be done as an outpatient. - eGFR <25 - cannot add Wilder Glade  -further GMDT per cardiology.   CKD stage IV --creatinine stable overall; follow BMP --continue to follow trend --Planning to transition diuretics to oral route.   Atrial fibrillation --continue anticoagulation  --consider change in agent if spontaneous mouth bleeding recurs (defer to PCP) --continue metoprolol; dose adjustments and further rate control agents as per cardiology service.   DM type 2 with Charcot foot, peripheral neuropathy --CBG still high, will increase basal insulin further --SSI   Cutaneous candidiasis --continue  nystatin   Spontaneous bleeding from mouth --present 1 month but resolved spontaneously in hospital. Has been seen by ENT and dental medicine; no cause found; consider change to another agent; PCP has referred to hematology; will continue to monitor for now. --Follow-up as an outpatient   Morbid obesity Body mass index is 41.81 kg/m. --Low calorie diet, portion control and increasing activity discussed with patient.   LE wound --left foot, plantar aspect chronic, nonhealing, neuropathic lesion. She has a known charcot foot deformity. She follows for the ulcer noted today with her podiatrist, Dr. Demetra Shiner at [facility]. Her last visit was January 3 of this year.She wears a surgical shoe on the left foot regularly to offload the ulcer site.Silver sulfadiazine has been in use. Wound care per RN. Recommend return visit to the podiatrist provider above post discharge. Recommend continuation of offloading shoe use when ambulating. -No significant signs of superimposed infection appreciated. -Continue supportive care.  Chills -No frank fever appreciated in the last 24 hours; heart rate now improved and well-controlled with oral metoprolol. -Urinalysis and chest x-ray will be check -WBCs down to 19,000 without intervention; urinalysis and chest x-ray not suggesting source of infection.   -Continue holding on antibiotic therapy at this point.   -Continue supportive care and follow clinical response.    Subjective: No fever, no chest pain, no nausea, no vomiting.  Patient reports no palpitations.  Breathing back to baseline and not requiring oxygen supplementation.   Physical Exam: Vitals:   09/18/21 0548 09/18/21 0721 09/18/21 1130 09/18/21 1556  BP: 99/60 94/61 109/65 105/62  Pulse: 88 84 97 85  Resp: 20 18 17 17   Temp: 98.3 F (36.8 C) 97.8 F (36.6 C) 97.8 F (36.6 C) 98.1  F (36.7 C)  TempSrc: Oral Oral Oral Oral  SpO2: 94% 98% 96% 94%  Weight:      Height:        General exam: Alert, awake, oriented x 3; no chest pain, no nausea, no vomiting.  Reports breathing back to baseline.  Not requiring oxygen supplementation. Respiratory system: Good air movement bilaterally, no wheezing, no frank crackles.  No using accessory muscle. Cardiovascular system: Rate controlled, no rubs, no gallops, unable to properly assess JVD with body habitus. Gastrointestinal system: Abdomen is obese, nondistended, soft and nontender. No organomegaly or masses felt. Normal bowel sounds heard. Central nervous system: Alert and oriented. No focal neurological deficits. Extremities: No cyanosis or clubbing; trace to 1+ edema appreciated bilaterally (mainly pedal edema on today's examination. Skin: No petechiae. Psychiatry: Judgement and insight appear normal. Mood & affect appropriate.   Data Reviewed: Basic metabolic panel: Sodium 341, potassium 3.4, chloride 96, 152, creatinine 2.38 Procalcitonin 3.09. BC: WBCs 19.7, hemoglobin 10.8 and platelet count 150K  HHPT, OT  Family Communication: husband at bedside  Disposition: Status is: Inpatient Remains inpatient appropriate because: volume overload, CHF and now with concerns for underlying infection.   Planned Discharge Destination: Home with Home Health PT OT Time spent: 20 minutes  Author: Barton Dubois, MD 09/18/2021 6:24 PM  For on call review [email or web address].

## 2021-09-18 NOTE — Progress Notes (Signed)
Physical Therapy Treatment Patient Details Name: Kristine [name] MRN: [id number] DOB: [birth year] Today's Date: 09/18/2021   History of Present Illness 76 y.o. female presents to [facility] on 09/11/2021 with worsening edema and SOB after her diuretic was recently reduced. Admitted 09/11/2021 for CHF exacerbation. PMH includes CKD, afib, COPD, GERD, HTN, DMII, PAH, Charcot's foot.    PT Comments    Pt tolerated therapy well today ambulating household distances with an assistive device. Chair follow is required as the Pt's mobility is limited by fatigue and SOB. Pt also demonstrates impaired strength and power during transfers from lower surfaces. Continued PT should facilitate the patient in improving activity tolerance and enhancing strength for ADLs.   Recommendations for follow up therapy are one component of a multi-disciplinary discharge planning process, led by the attending physician.  Recommendations may be updated based on patient status, additional functional criteria and insurance authorization.  Follow Up Recommendations  Home health PT     Assistance Recommended at Discharge Intermittent Supervision/Assistance  Patient can return home with the following A little help with walking and/or transfers;A little help with bathing/dressing/bathroom;Assistance with cooking/housework;Assist for transportation;Help with stairs or ramp for entrance   Equipment Recommendations  None recommended by PT    Recommendations for Other Services       Precautions / Restrictions Precautions Precautions: Fall Restrictions Weight Bearing Restrictions: No     Mobility  Bed Mobility Overal bed mobility: Needs Assistance Bed Mobility: Supine to Sit     Supine to sit: Min assist Sit to supine: Min assist   General bed mobility comments: trunk support for supine to sit; leg support for sit to supine    Transfers Overall transfer level: Needs assistance Equipment used: Rolling  walker (2 wheels) Transfers: Sit to/from Stand Sit to Stand: Min assist, Mod assist (Min assist for transfer out of chair; mod assist for transfer from toilet when she was more fatigued)                Ambulation/Gait Ambulation/Gait assistance: Min guard Gait Distance (Feet): 60 Feet (Additional 10' walked from toilet to bed) Assistive device: Rolling walker (2 wheels) Gait Pattern/deviations: Step-through pattern, Decreased step length - right, Decreased step length - left, Decreased stride length Gait velocity: decreased Gait velocity interpretation: <1.8 ft/sec, indicate of risk for recurrent falls   General Gait Details: Chair follow due to Pt lack of endurance   Stairs             Wheelchair Mobility    Modified Rankin (Stroke Patients Only)       Balance Overall balance assessment: Needs assistance Sitting-balance support: Feet supported, No upper extremity supported Sitting balance-Leahy Scale: Good     Standing balance support: Bilateral upper extremity supported, Reliant on assistive device for balance Standing balance-Leahy Scale: Poor                              Cognition Arousal/Alertness: Awake/alert Behavior During Therapy: WFL for tasks assessed/performed Overall Cognitive Status: Within Functional Limits for tasks assessed                                          Exercises      General Comments General comments (skin integrity, edema, etc.): Skin tear on right forearm managed by PT; nurse informed about this issue  Pertinent Vitals/Pain Pain Assessment Pain Assessment: Faces Faces Pain Scale: No hurt (Pt reports pain in arms from bearing weight during ambulation) Pain Location: arms Pain Descriptors / Indicators: Sore Pain Intervention(s): Monitored during session    Home Living                          Prior Function            PT Goals (current goals can now be found in the care  plan section) Acute Rehab PT Goals Patient Stated Goal: to return home PT Goal Formulation: With patient Time For Goal Achievement: 09/26/21 Potential to Achieve Goals: Good Progress towards PT goals: Progressing toward goals    Frequency    Min 3X/week      PT Plan Current plan remains appropriate    Co-evaluation              AM-PAC PT "6 Clicks" Mobility   Outcome Measure  Help needed turning from your back to your side while in a flat bed without using bedrails?: A Little Help needed moving from lying on your back to sitting on the side of a flat bed without using bedrails?: A Little Help needed moving to and from a bed to a chair (including a wheelchair)?: A Little Help needed standing up from a chair using your arms (e.g., wheelchair or bedside chair)?: A Little Help needed to walk in hospital room?: A Little Help needed climbing 3-5 steps with a railing? : A Lot 6 Click Score: 17    End of Session   Activity Tolerance: Patient tolerated treatment well Patient left: in bed;with call bell/phone within reach;with family/visitor present Nurse Communication: Mobility status PT Visit Diagnosis: Other abnormalities of gait and mobility (R26.89);Muscle weakness (generalized) (M62.81);Unsteadiness on feet (R26.81)     Time: 1329-1410 PT Time Calculation (min) (ACUTE ONLY): 41 min  Charges:  $Gait Training: 8-22 mins $Therapeutic Activity: 8-22 mins                     Hall Busing, SPT Acute Rehabilitation Office #: [phone]    Hall Busing 09/18/2021, 3:31 PM

## 2021-09-18 NOTE — Progress Notes (Signed)
Heart Failure Stewardship Pharmacist Progress Note   PCP: Prince Solian, MD PCP-Cardiologist: Mertie Moores, MD    HPI:  76 yo F with PMH of afib, NICM, CHF, T2DM, HTN, HLD, hypothyroidism, and sleep apnea. She presented to the ED on 6/22 with shortness of breath, LE edema, orthopnea, and lightheadedness. Recently, her outpatient nephrologist stopped her PTA metolazone due to worsening renal function but she had worsening edema because of this. CXR with mild to moderate cardiomegaly with mild pulmonary vascular congestion. An ECHO was done on 6/23 and LVEF 55-60% with mild LVH, severely reduced RV, and mild AS. Compared to her last ECHO in 07/2020, LVEF somewhat improved from 45-50%. She has had notable RV systolic dysfunction since her ECHO in 05/2018.  Current HF Medications: Diuretic: furosemide 120 mg PO BID + metolazone 5 mg three times weekly Beta Blocker: metoprolol XL 25 mg daily  Prior to admission HF Medications: Diuretic: furosemide 120 mg BID Beta blocker: metoprolol tartrate 25 mg qhs Aldosterone Antagonist: spironolactone 25 mg daily  Pertinent Lab Values: Serum creatinine 2.38, BUN 52, Potassium 3.4, Sodium 139, BNP 368.7, Magnesium 2.7, A1c 7.0   Vital Signs: Weight: 283 lbs (admission weight: 298 lbs) Blood pressure: 100-110/60s  Heart rate: 80s - afib  I/O: -3.1L yesterday; net -12.3L  Medication Assistance / Insurance Benefits Check: Does the patient have prescription insurance?  Yes Type of insurance plan: [place] [employer]  Outpatient Pharmacy:  Prior to admission outpatient pharmacy: [facility] Is the patient willing to use [facility] at discharge? Yes Is the patient willing to transition their outpatient pharmacy to utilize a [facility] outpatient pharmacy?   Pending    Assessment: 1. Acute on chronic diastolic CHF (LVEF 33-74%). NYHA class II symptoms. Symptoms due to severe RV dysfunction and de-escalation of diuretics as outpatient. - Volume  status improved. Agree with resuming PTA home diuretics - furosemide 120 mg BID + metolazone three times weekly (MWF). Dose increased from 2.5>5 mg. K replaced with 40 mEq x 2. - Agree with increasing to metoprolol XL 25 mg daily - No ACE/ARB/ARNI with advanced CKD - Consider restarting spironolactone prior to discharge if renal function allows - eGFR <25 - cannot add [place]  - Consider starting Jardiance 10 mg daily if eGFR remains >20. UA clear of infection.   Plan: 1) Medication changes recommended at this time: - Agree with diuretic changes today  2) Patient assistance: Delene Loll copay $25 - Farxiga/Jardiance copay $25  3)  Education  - Patient has been educated on current HF medications and potential additions to HF medication regimen - Patient verbalizes understanding that over the next few months, these medication doses may change and more medications may be added to optimize HF regimen - Patient has been educated on basic disease state pathophysiology and goals of therapy   Kerby Nora, PharmD, BCPS Heart Failure Stewardship Pharmacist Phone [phone]

## 2021-09-18 NOTE — Telephone Encounter (Signed)
Pt called to let Dr. Burr Medico know she's admitted in [facility] and want to let Dr. Burr Medico know that she will not be able to make the appt scheduled tomorrow due to this admission.  Notified Dr. Burr Medico and cancelled appt per Dr. Ernestina Penna request.

## 2021-09-18 NOTE — Care Management Important Message (Signed)
Important Message  Patient Details  Name: Kristine [name] MRN: [id number] Date of Birth: 1945/07/21   Medicare Important Message Given:  Yes     Shelda Altes 09/18/2021, 8:19 AM

## 2021-09-18 NOTE — Progress Notes (Signed)
Called by patient, who had been placed on the bedside commode, that blood is dripping from her left foot.Upon assessment, there was a new skin peel on the left toe, likely sustained whiles getting out of bed. Slow bleed persisted despite holding pressure for a prolong time. Pressure dressing applied after hemostasis was obtained.

## 2021-09-18 NOTE — Progress Notes (Addendum)
Progress Note  Patient Name: Kristine [name] Date of Encounter: 09/18/2021  [facility] Cardiologist: Mertie Moores, MD   Subjective   Patient denies chest pain, palpitations, sob. Still has some ankle edema. In the past, she has worn compression socks but does not anymore.   Inpatient Medications    Scheduled Meds:  allopurinol  300 mg Oral q AM   DULoxetine  30 mg Oral q AM   insulin aspart  0-9 Units Subcutaneous TID WC   insulin glargine-yfgn  30 Units Subcutaneous BID   levothyroxine  88 mcg Oral q morning   metoprolol succinate  25 mg Oral Daily   nystatin cream   Topical BID   polyethylene glycol  17 g Oral BID   pregabalin  50 mg Oral BID   Rivaroxaban  15 mg Oral Daily   sodium chloride flush  3 mL Intravenous Q12H   Continuous Infusions:  sodium chloride 250 mL (09/15/21 2307)   furosemide 100 mg (09/18/21 1053)   PRN Meds: sodium chloride, acetaminophen **OR** acetaminophen, ALPRAZolam, bisacodyl, HYDROcodone-acetaminophen, sodium chloride flush, traMADol   Vital Signs    Vitals:   09/18/21 0037 09/18/21 0516 09/18/21 0548 09/18/21 0721  BP: (!) 110/57  99/60 94/61  Pulse: (!) 104  88 84  Resp:   20 18  Temp: 98.5 F (36.9 C)  98.3 F (36.8 C) 97.8 F (36.6 C)  TempSrc: Oral  Oral Oral  SpO2: 100%  94% 98%  Weight:  128.5 kg    Height:        Intake/Output Summary (Last 24 hours) at 09/18/2021 1054 Last data filed at 09/18/2021 0550 Gross per 24 hour  Intake 240 ml  Output 1525 ml  Net -1285 ml      09/18/2021    5:16 AM 09/17/2021    3:00 AM 09/16/2021    1:59 AM  Last 3 Weights  Weight (lbs) 283 lb 4.8 oz 283 lb 1.6 oz 288 lb 6.4 oz  Weight (kg) 128.504 kg 128.413 kg 130.817 kg      Telemetry    Atrial fibrillation, HR in the 80s-90s - Personally Reviewed  ECG    Atrial fibrillation, HR 122, low voltage QRS - Personally Reviewed  Physical Exam   GEN: No acute distress.  Sitting comfortably on the side of the bed  Neck:  No JVD Cardiac: Irregular rate and rhythm, distant heart sounds  Respiratory: Diminished breath sounds throughout, Crackles in left lung base  GI: Soft, nontender, non-distended  MS: Trace edema in BLU; No deformity. Neuro:  Nonfocal  Psych: Normal affect   Labs    High Sensitivity Troponin:   Recent Labs  Lab 09/11/21 1730 09/11/21 2038  TROPONINIHS 51* 51*     Chemistry Recent Labs  Lab 09/11/21 1730 09/12/21 0247 09/12/21 2304 09/16/21 0226 09/17/21 0317 09/17/21 1142 09/18/21 0632  NA 137 136   < > 139 139  --  139  K 3.0* 3.0*   < > 3.6 3.6  --  3.4*  CL 97* 97*   < > 97* 96*  --  96*  CO2 28 27   < > 30 31  --  31  GLUCOSE 183* 115*   < > 154* 212*  --  126*  BUN 51* 51*   < > 47* 50*  --  52*  CREATININE 2.49* 2.32*   < > 2.19* 2.22*  --  2.38*  CALCIUM 9.8 9.3   < > 10.3 10.1  --  9.8  MG 2.7*  --   --   --   --  2.6*  --   PROT 7.5 6.2*  --   --   --   --   --   ALBUMIN 4.1 3.5  --   --   --   --   --   AST 18 15  --   --   --   --   --   ALT 10 9  --   --   --   --   --   ALKPHOS 105 87  --   --   --   --   --   BILITOT 1.7* 1.5*  --   --   --   --   --   GFRNONAA 20* 22*   < > 23* 23*  --  21*  ANIONGAP 12 12   < > 12 12  --  12   < > = values in this interval not displayed.    Lipids No results for input(s): "CHOL", "TRIG", "HDL", "LABVLDL", "LDLCALC", "CHOLHDL" in the last 168 hours.  Hematology Recent Labs  Lab 09/12/21 0247 09/17/21 1631 09/18/21 0632  WBC 10.1 24.3* 19.7*  RBC 3.88 3.86* 3.65*  HGB 11.4* 11.6* 10.8*  HCT 35.7* 35.1* 33.6*  MCV 92.0 90.9 92.1  MCH 29.4 30.1 29.6  MCHC 31.9 33.0 32.1  RDW 19.1* 19.3* 19.1*  PLT 154 145* 150   Thyroid  Recent Labs  Lab 09/12/21 0247  TSH 5.314*    BNP Recent Labs  Lab 09/11/21 1730  BNP 368.7*    DDimer No results for input(s): "DDIMER" in the last 168 hours.   Radiology    DG Chest 2 View  Result Date: 09/17/2021 CLINICAL DATA:  Shortness of breath with edema. EXAM: CHEST  - 2 VIEW COMPARISON:  Chest x-ray 09/11/2021 FINDINGS: The heart is enlarged, unchanged. Mediastinal silhouette is stable. There is no focal lung infiltrate, pleural effusion or pneumothorax. There is central pulmonary vascular congestion. No acute fractures are seen. IMPRESSION: 1. Cardiomegaly with central pulmonary vascular congestion. Electronically Signed   By: Ronney Asters M.D.   On: 09/17/2021 19:04    Cardiac Studies   Echocardiogram 09/12/21  1. Left ventricular ejection fraction, by estimation, is 55 to 60%. The  left ventricle has normal function. The left ventricle has no regional  wall motion abnormalities. There is mild left ventricular hypertrophy.  Left ventricular diastolic function  could not be evaluated. There is the interventricular septum is flattened  in diastole ('D' shaped left ventricle), consistent with right ventricular  volume overload.   2. Right ventricular systolic function is severely reduced. The right  ventricular size is severely enlarged. There is severely elevated  pulmonary artery systolic pressure.   3. The mitral valve is normal in structure. No evidence of mitral valve  regurgitation.   4. Tricuspid valve regurgitation is mild to moderate.   5. The aortic valve is calcified. Aortic valve regurgitation is not  visualized. Mild aortic valve stenosis.   6. The inferior vena cava is dilated in size with <50% respiratory  variability, suggesting right atrial pressure of 15 mmHg.   Patient Profile     76 y.o. female with a history of persistent afib, non-ischemic cardiomyopathy, chronic systolic heart failure, DKD, DM, HTN, Hypothyroidism, HLD, OSA who si being seen for worsening LE edema and SOB consistent with acute on chronic biventricular failure   Assessment & Plan  Acute on Chronic Right Sided HF: Acute on Chronic Heart Failure Improved EF Pulmonary HTN  - Patient with mainly right sided symptoms in the setting of severe RV enlargement and  severe RV dysfunction likely triggered by down-titration of diuretics as outpatient. Also with history of mildly reduced EF that improved to 60-65% on most recent TTE. - Responding well to IV diuresis + metolazone. Wt down from 298>283lbs - Has been on IV lasix 100 mg TID and metolazone 5 mg-- output 1.52 L urine yesterday and is currently net -12.4 L since admission. Urine output seems to be slowing down   - Creatinine 2.38, starting to trend up slowly - Continue lasix '100mg'$  IV TID for now, suspect she can be transitioned to oral soon (possibly tonight) - Continue metop '25mg'$  XL daily  - Other GDMT is limited by renal function  - Monitor I/Os and daily weights - K 3.4-- I ordered K supplementation    Persistent Afib - Historically, HR was very well controlled in the 80s. However, yesterday afternoon patient sustained HR in the 120s-140s.  - Increased metoprolol to 25 mg daily  - There was a concern that patient's anxiety was contributing to elevated HR-- continue PRN xanax  - Now, HR is stable in the 90s  - Continue xarelto '15mg'$  daily   Leukocytosis  - CBC showed WBC 24.3 yesterday.  - Patient afebrile, urinalysis negative for UTI. CXR with vascular congestion.  - Complained of chills. No other symptoms to indicate infection.  - Ordered procal  HTN - Continue metop '25mg'$  XL daily  - BP stable    CKD stage IV - Baseline creatinine around 2.6 -Monitor with diuresis - Avoid nephrotoxic medications       For questions or updates, please contact [facility] Please consult [email or web address] for contact info under        Signed, Margie Billet, PA-C  09/18/2021, 10:54 AM    Patient seen and examined and agree with Vikki Ports, PA-C as detailed above.    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   Currently, patient feels much better. Has responded well to IV lasix and metolazone. Appears near euvolemic with minimal ankle edema. Will transition to PO lasix '120mg'$  PO BID and metolazone '5mg'$  M,W, F and ensure she monitors her weight closely at home. If remains stable in terms of renal function and weight tomorrow; can likely go home from CV standpoint.  Of note, WBC was 24 yesterday but infectious work-up was unrevealing. Has trended down to 19 today without intervention and patient denies any localizing symptoms. Primary team monitoring   GEN: No acute distress.  Sitting up in a chair Neck: No significant JVD Cardiac: RRR, no murmurs, rubs, or gallops.  Respiratory: Clear to auscultation bilaterally. GI: Obese, soft MS: Trace ankle edema, warm Neuro:  Nonfocal  Psych: Normal affect    Plan: -Transition to lasix '120mg'$  PO BID and metolazone '5mg'$  every M, W, F -Continue K supplementation; likely will need on discharge -Continue metop '25mg'$  daily and xarelto for [facility] Afib  -Will ensure weight and renal function are stable and likely discharge home tomorrow -Will need  very close monitoring of daily weights as outpatient as well as repeat BMET 1 week post-discharge     Gwyndolyn Kaufman, MD

## 2021-09-18 NOTE — Progress Notes (Signed)
Mobility Specialist: Progress Note   09/18/21 1132  Mobility  Activity Ambulated with assistance in hallway  Level of Assistance Minimal assist, patient does 75% or more  Assistive Device Front wheel walker  Distance Ambulated (ft) 100 ft  Activity Response Tolerated well  $Mobility charge 1 Mobility   Pre-Mobility: 87 HR, 100% SpO2 Post-Mobility: 98 HR, 96% SpO2  Pt received in the bed and agreeable to mobility. To [facility] per request and then agreeable to ambulation. No c/o throughout. Pt sitting EOB after session with call bell and phone in reach. NT present in the room.   [facility] Silvano Garofano Mobility Specialist Mobility Specialist [address]: [phone]

## 2021-09-19 ENCOUNTER — Other Ambulatory Visit (HOSPITAL_COMMUNITY): Payer: Self-pay

## 2021-09-19 ENCOUNTER — Other Ambulatory Visit: Payer: Self-pay | Admitting: Cardiology

## 2021-09-19 ENCOUNTER — Inpatient Hospital Stay: Payer: Medicare HMO | Admitting: Hematology

## 2021-09-19 DIAGNOSIS — J810 Acute pulmonary edema: Secondary | ICD-10-CM | POA: Diagnosis not present

## 2021-09-19 DIAGNOSIS — I50811 Acute right heart failure: Secondary | ICD-10-CM | POA: Diagnosis not present

## 2021-09-19 DIAGNOSIS — N184 Chronic kidney disease, stage 4 (severe): Secondary | ICD-10-CM | POA: Diagnosis not present

## 2021-09-19 DIAGNOSIS — I5032 Chronic diastolic (congestive) heart failure: Secondary | ICD-10-CM

## 2021-09-19 DIAGNOSIS — I4819 Other persistent atrial fibrillation: Secondary | ICD-10-CM | POA: Diagnosis not present

## 2021-09-19 DIAGNOSIS — F419 Anxiety disorder, unspecified: Secondary | ICD-10-CM | POA: Diagnosis not present

## 2021-09-19 DIAGNOSIS — E1142 Type 2 diabetes mellitus with diabetic polyneuropathy: Secondary | ICD-10-CM | POA: Diagnosis not present

## 2021-09-19 LAB — GLUCOSE, CAPILLARY
Glucose-Capillary: 120 mg/dL — ABNORMAL HIGH (ref 70–99)
Glucose-Capillary: 224 mg/dL — ABNORMAL HIGH (ref 70–99)

## 2021-09-19 LAB — BASIC METABOLIC PANEL
Anion gap: 13 (ref 5–15)
BUN: 58 mg/dL — ABNORMAL HIGH (ref 8–23)
CO2: 29 mmol/L (ref 22–32)
Calcium: 9.7 mg/dL (ref 8.9–10.3)
Chloride: 94 mmol/L — ABNORMAL LOW (ref 98–111)
Creatinine, Ser: 2.47 mg/dL — ABNORMAL HIGH (ref 0.44–1.00)
GFR, Estimated: 20 mL/min — ABNORMAL LOW (ref >60)
Glucose, Bld: 138 mg/dL — ABNORMAL HIGH (ref 70–99)
Potassium: 3.7 mmol/L (ref 3.5–5.1)
Sodium: 136 mmol/L (ref 135–145)

## 2021-09-19 MED ORDER — DICLOFENAC SODIUM 1 % EX GEL: 4.0000 g | Freq: Four times a day (QID) | CUTANEOUS | Status: DC | PRN

## 2021-09-19 MED ORDER — POTASSIUM CHLORIDE CRYS ER 20 MEQ PO TBCR
40.0000 meq | EXTENDED_RELEASE_TABLET | Freq: Two times a day (BID) | ORAL | Status: DC
  Administered 2021-09-19: 40 meq via ORAL
  Filled 2021-09-19: qty 2

## 2021-09-19 MED ORDER — FUROSEMIDE 80 MG PO TABS
120.0000 mg | ORAL_TABLET | Freq: Two times a day (BID) | ORAL | 3 refills | Status: DC
  Filled 2021-09-19: qty 90, 30d supply, fill #0

## 2021-09-19 MED ORDER — METOLAZONE 2.5 MG PO TABS
2.5000 mg | ORAL_TABLET | ORAL | 3 refills | Status: DC
  Filled 2021-09-19: qty 15, 35d supply, fill #0

## 2021-09-19 MED ORDER — FUROSEMIDE 40 MG PO TABS: 120.0000 mg | ORAL_TABLET | Freq: Two times a day (BID) | ORAL | Status: DC

## 2021-09-19 MED ORDER — METOLAZONE 2.5 MG PO TABS: 2.5000 mg | ORAL_TABLET | ORAL | Status: DC

## 2021-09-19 MED ORDER — POTASSIUM CHLORIDE CRYS ER 20 MEQ PO TBCR
40.0000 meq | EXTENDED_RELEASE_TABLET | Freq: Two times a day (BID) | ORAL | 2 refills | Status: DC
  Filled 2021-09-19: qty 120, 30d supply, fill #0

## 2021-09-19 MED ORDER — METOPROLOL SUCCINATE ER 50 MG PO TB24
25.0000 mg | ORAL_TABLET | Freq: Every day | ORAL | 2 refills | Status: DC
  Filled 2021-09-19: qty 30, 60d supply, fill #0

## 2021-09-19 NOTE — Discharge Summary (Signed)
Physician Discharge Summary   Patient: Kristine [name] MRN: [id number] DOB: 10/12/1945  Admit date:     09/11/2021  Discharge date: 09/19/21  Discharge Physician: Barton Dubois   PCP: Prince Solian, MD   Recommendations at discharge:  Repeat basic metabolic panel to follow electrolytes and renal function Repeat CBC to follow hemoglobin trend/stability Reassess blood pressure and further adjust antihypertensive regimen as needed. Continue close follow-up with cardiology service for further optimization of heart failure medications. Continue assisting patient with weight management  Discharge Diagnoses: Principal Problem:   CHF (congestive heart failure) (HCC) Active Problems:   Acute on chronic combined systolic and diastolic CHF (congestive heart failure) (HCC)   Diabetes mellitus, type 2 (HCC)   Diabetic peripheral neuropathy (HCC)   Hypokalemia   Anxiety   CKD (chronic kidney disease) stage 4, GFR 15-29 ml/min (HCC)   High cholesterol   Essential hypertension   Pulmonary hypertension (HCC)   Persistent atrial fibrillation (HCC)   Acute pulmonary edema (HCC)   Acute right-sided heart failure [facility])    Hospital Course: As per H&P written by Dr. Roel Cluck on 09/11/2021  Kristine [name] is a  76 y.o. female with medical history significant of CKD, A.fib on anticoagulation, COPD, GERD, HTN, Morbid obesity, OSA, DM2, hypothyrodism Charcot's foot, pulmonary hypertension   Presented with   worsening edema and shortness of breath Known history of CHF complaining of shortness of breath has been taking her diuretics but recently her dose was reduced when she was visiting her nephrologist. Her metolazone was stopped few weeks ago because her kidney function was worsening.  But she continues to take on a 20 mg of Lasix twice a day she noticed that after she stopped her metolazone her fluid started to build up.  Now she is sleeping on 3 pillows usually was able to walk to the  bathroom but now very short of breath and lightheaded when she tries.  She has not been able to sleep very well because of shortness of breath.  She noticed that her weight is gone up no fevers no chills no chest pain   Reports she had a mild CP this week but non since She uses a walker She reports sensation of blood in her mouth every morning She denies coughing up blood  Assessment and Plan: Acute on chronic right-sided CHF --with significant volume overload initially. LVEF 78-93%, RV systolic function severely reduced. Compared to study 07/2020, LVEF has improved; RV without change; pericardial effusion resolved -- Continue to follow urine output and renal function. -- After discussing with cardiology service plan is to transition diuretics to oral route and assess ability to keep negative balance increase urine output and renal function at ability prior to discharge. - No ACE/ARB/ARNI with advanced CKD - Consider restarting spironolactone at follow-up visit with cardiology service if renal function allows; otherwise further adjustment to medications to be done as an outpatient. - eGFR <25 - cannot add Wilder Glade  -further GMDT per cardiology.   CKD stage IV --creatinine level is slightly up after aggressive diuresis.  GFR within baseline limits for her.   -- Close outpatient follow-up renal function/electrolytes specially with adjusted doses of diuretics.   --continue to follow trend --Planning to transition diuretics to oral route.  Hypokalemia -In the setting of diuresis -Continue daily supplementation and close outpatient monitoring to further replete as required.   Atrial fibrillation --continue anticoagulation  --consider change in agent if spontaneous mouth bleeding recurs (defer to PCP). --continue metoprolol; dose  adjustments and further rate control agents as per cardiology service.   DM type 2 with Charcot foot, peripheral neuropathy --CBG still high, will increase basal  insulin further --SSI   Cutaneous candidiasis --continue nystatin and maintain adequate hygiene. -Keep area clean and dry.   Spontaneous bleeding from mouth --present 1 month but resolved spontaneously in hospital. Has been seen by ENT and dental medicine; no cause found; consider change to another agent; PCP has referred to hematology; will continue to monitor for now. --Follow-up as an outpatient -No overt bleeding appreciated throughout hospitalization.   Morbid obesity Body mass index is 41.81 kg/m. --Low calorie diet, portion control and increasing activity discussed with patient.   LE wound --left foot, plantar aspect chronic, nonhealing, neuropathic lesion. She has a known charcot foot deformity. She follows for the ulcer noted today with her podiatrist, Dr. Demetra Shiner at [facility]. Her last visit was January 3 of this year.She wears a surgical shoe on the left foot regularly to offload the ulcer site.Silver sulfadiazine has been in use. Wound care per RN.  -Recommend return visit to the podiatrist provider above post discharge. Recommend continuation of offloading shoe use when ambulating. -No significant signs of superimposed infection appreciated. -Continue supportive care.   Chills -No frank fever appreciated in the last 48 hours; heart rate now improved and well-controlled with oral metoprolol. -Urinalysis and chest x-ray without signs of acute infection. -WBCs has continued trending down on its own without specific intervention. -Repeat CBC to follow resolution/stability. -Continue supportive care and maintain adequate hydration -No antibiotics treatment provided.  Consultants: Cardiology Procedures performed: See below for x-ray reports. Disposition: Home with home health services. Diet recommendation: Heart healthy and low calorie diet.  DISCHARGE MEDICATION: Allergies as of 09/19/2021       Reactions   Albuterol Other (See Comments)   REACTION:  Tachycardia- AFib   Epinephrine Other (See Comments)   Increases heart rate per patient.    Penicillins Other (See Comments)   REACTION: flushing \T\ hot Did it involve swelling of the face/tongue/throat, SOB, or low BP? No Did it involve sudden or severe rash/hives, skin peeling, or any reaction on the inside of your mouth or nose? No Did you need to seek medical attention at a hospital or doctor's office? No When did it last happen?    20 years ago   If all above answers are "NO", may proceed with cephalosporin use.   Ceftriaxone Other (See Comments)   Sweats   Citalopram Hydrobromide Other (See Comments)   Prolonged QT prolongation   Exenatide Other (See Comments)   Unknown reaction   Ketorolac Tromethamine Hives, Swelling   swelling in hands   Tizanidine Other (See Comments)   lethargic   Torsemide Swelling   Zoledronic Acid Other (See Comments)   Pt was hospitalized due to medication   Bee Venom Other (See Comments)   "makes me nervous"   Ivp Dye [iodinated Contrast Media] Other (See Comments)   flushing   Keflex [cephalexin] Other (See Comments)   Makes her feel warm; she was told never to take it.        Medication List     STOP taking these medications    erythromycin ophthalmic ointment   fluconazole 150 MG tablet Commonly known as: DIFLUCAN   Metanx 3-90.314-2-35 MG Caps   metoprolol tartrate 25 MG tablet Commonly known as: LOPRESSOR   oxyCODONE-acetaminophen 5-325 MG tablet Commonly known as: PERCOCET/ROXICET   spironolactone 25 MG tablet  Commonly known as: ALDACTONE       TAKE these medications    acetaminophen 500 MG tablet Commonly known as: TYLENOL Take 1,000 mg by mouth every 6 (six) hours as needed for moderate pain or headache.   allopurinol 300 MG tablet Commonly known as: ZYLOPRIM Take 300 mg by mouth in the morning.   ALPRAZolam 0.5 MG tablet Commonly known as: XANAX Take 0.5 mg by mouth every 6 (six) hours as needed for  anxiety.   BD Pen Needle Nano U/F 32G X 4 MM Misc Generic drug: Insulin Pen Needle Inject 1 each as directed See admin instructions. Use pen needles with insulin pens daily   colchicine 0.6 MG tablet Take 0.6 mg by mouth daily as needed (gout flares).   diclofenac Sodium 1 % Gel Commonly known as: VOLTAREN Apply 4 g topically 4 (four) times daily as needed (back pain).   docusate sodium 100 MG capsule Commonly known as: COLACE Take 100 mg by mouth daily as needed for mild constipation.   DULoxetine 30 MG capsule Commonly known as: CYMBALTA Take 30 mg by mouth in the morning.   estradiol 0.1 MG/GM vaginal cream Commonly known as: ESTRACE Place 1 Applicatorful vaginally as needed (vaginal irritation).   furosemide 80 MG tablet Commonly known as: LASIX Take 1.5 tablets (120 mg total) by mouth 2 (two) times daily. Start taking on: September 20, 2021 What changed: See the new instructions.   HYDROcodone-acetaminophen 5-325 MG tablet Commonly known as: NORCO/VICODIN Take 1 tablet by mouth every 6 (six) hours as needed for severe pain.   levothyroxine 88 MCG tablet Commonly known as: SYNTHROID Take 88 mcg by mouth every morning.   lidocaine-prilocaine cream Commonly known as: EMLA 1 gram qid prn What changed:  how much to take how to take this when to take this reasons to take this additional instructions   Lyrica 50 MG capsule Generic drug: pregabalin Take 50 mg by mouth 2 (two) times daily.   metolazone 2.5 MG tablet Commonly known as: ZAROXOLYN Take 1 tablet (2.5 mg total) by mouth 3 (three) times a week. Mon, Wed, Fri. Start taking on: September 22, 2021 What changed: additional instructions   metoprolol succinate 50 MG 24 hr tablet Commonly known as: TOPROL-XL Take 1/2 tablet (25 mg total) by mouth daily. What changed: See the new instructions.   NovoLOG FlexPen 100 UNIT/ML FlexPen Generic drug: insulin aspart 40-70 Units See admin instructions. Less than 150 units  150=40 units151-199=45 units 200-249=50 units 250-299=55 units 300-349=60 units   nystatin powder Commonly known as: MYCOSTATIN/NYSTOP Apply 1 application topically as needed (skin irritation/rash).   ondansetron 4 MG tablet Commonly known as: Zofran Take 1 tablet (4 mg total) by mouth every 8 (eight) hours as needed for nausea or vomiting.   polyethylene glycol 17 g packet Commonly known as: MIRALAX / GLYCOLAX Take 17 g by mouth daily as needed (constipation.).   potassium chloride SA 20 MEQ tablet Commonly known as: KLOR-CON M Take 2 tablets (40 mEq total) by mouth 2 (two) times daily.   Regranex 0.01 % gel Generic drug: becaplermin Apply 1 Application topically daily. Apply to left foot   SYSTANE OP Place 1 drop into both eyes 2 (two) times daily as needed (dry eyes).   traMADol 50 MG tablet Commonly known as: ULTRAM Take 50 mg by mouth 3 (three) times daily as needed (severe neuropathy pain.).   Tyler Aas FlexTouch 200 UNIT/ML FlexTouch Pen Generic drug: insulin degludec Inject 100 Units into the  skin in the morning and at bedtime.   Vitamin D (Ergocalciferol) 1.25 MG (50000 UNIT) Caps capsule Commonly known as: DRISDOL Take 50,000 Units by mouth every Monday.   Xarelto 15 MG Tabs tablet Generic drug: Rivaroxaban Take 15 mg by mouth in the morning.               Discharge Care Instructions  (From admission, onward)           Start     Ordered   09/19/21 0000  Discharge wound care:       Comments: Cleans food with soap and water, rinse and dry.  Cover lesion with size appropriate piece of silver Hydrofiber (Aquacel Ag plus advantage, Kellie Simmering #[id number]).  Topped with dry gauze 2 x 2 and secure with silicone foam dressing.  Perform wound care daily.   09/19/21 1220            Follow-up Information     Avva, Ravisankar, MD Follow up.   Specialty: Internal Medicine Why: The office will call patient. Contact information: [place] [address] [phone]         Nahser, Wonda Cheng, MD .   Specialty: Cardiology Contact information: [address] [place] [address] [phone]         Cable, Foss Follow up.   Specialty: Home Health Services Why: [facility] Contact information: [facility] [place] [address] [phone]         [place] Office Follow up on 09/22/2021.   Specialty: Cardiology Why: Please present to have labs drawn at 1:40 PM. No need to fast. Contact information: [address] [place] [phone]               Discharge Exam: Filed Weights   09/17/21 0300 09/18/21 0516 09/19/21 0554  Weight: 128.4 kg 128.5 kg 129.2 kg   General exam: Alert, awake, oriented x 3; no chest pain, no nausea, no vomiting.  Reports breathing back to baseline.  Not requiring oxygen supplementation. Respiratory system: Good air movement bilaterally, no wheezing, no frank crackles.  No using accessory muscle. Cardiovascular system: Rate controlled, no rubs, no gallops, unable to properly assess JVD with body habitus. Gastrointestinal system: Abdomen is obese, nondistended, soft and nontender. No organomegaly or masses felt. Normal bowel sounds heard. Central nervous system: Alert and oriented. No focal neurological deficits. Extremities: No cyanosis or clubbing; trace to 1+ edema appreciated bilaterally (mainly pedal edema on today's examination. Skin: No petechiae. Psychiatry: Judgement and insight appear normal. Mood & affect appropriate.   Condition at discharge: Stable and improved.  The results of significant diagnostics from this hospitalization (including imaging, microbiology, ancillary and laboratory) are listed below for reference.   Imaging Studies: DG Chest 2 View  Result Date: 09/17/2021 CLINICAL DATA:  Shortness of breath with edema. EXAM: CHEST - 2 VIEW  COMPARISON:  Chest x-ray 09/11/2021 FINDINGS: The heart is enlarged, unchanged. Mediastinal silhouette is stable. There is no focal lung infiltrate, pleural effusion or pneumothorax. There is central pulmonary vascular congestion. No acute fractures are seen. IMPRESSION: 1. Cardiomegaly with central pulmonary vascular congestion. Electronically Signed   By: Ronney Asters M.D.   On: 09/17/2021 19:04   ECHOCARDIOGRAM COMPLETE  Result Date: 09/12/2021    ECHOCARDIOGRAM REPORT   Patient Name:   SAMYA SICILIANO Date of Exam: 09/12/2021 Medical Rec #:  [id number]  Height:       69.0 in Accession #:    [id number]           Weight:       296.8 lb Date of Birth:  April 09, 1945           BSA:          2.443 m Patient Age:    76 years             BP:           113/71 mmHg Patient Gender: F                    HR:           105 bpm. Exam Location:  Inpatient Procedure: 2D Echo, Cardiac Doppler and Color Doppler Indications:    CHF- Acute diastolic  History:        Patient has prior history of Echocardiogram examinations, most                 recent 08/08/2020. CHF; Risk Factors:Hypertension and Diabetes.  Sonographer:    Jefferey Pica Referring Phys: Carnuel  1. Left ventricular ejection fraction, by estimation, is 55 to 60%. The left ventricle has normal function. The left ventricle has no regional wall motion abnormalities. There is mild left ventricular hypertrophy. Left ventricular diastolic function could not be evaluated. There is the interventricular septum is flattened in diastole ('D' shaped left ventricle), consistent with right ventricular volume overload.  2. Right ventricular systolic function is severely reduced. The right ventricular size is severely enlarged. There is severely elevated pulmonary artery systolic pressure.  3. The mitral valve is normal in structure. No evidence of mitral valve regurgitation.  4. Tricuspid valve regurgitation is mild to moderate.  5. The  aortic valve is calcified. Aortic valve regurgitation is not visualized. Mild aortic valve stenosis.  6. The inferior vena cava is dilated in size with <50% respiratory variability, suggesting right atrial pressure of 15 mmHg. FINDINGS  Left Ventricle: Left ventricular ejection fraction, by estimation, is 55 to 60%. The left ventricle has normal function. The left ventricle has no regional wall motion abnormalities. The left ventricular internal cavity size was normal in size. There is  mild left ventricular hypertrophy. The interventricular septum is flattened in diastole ('D' shaped left ventricle), consistent with right ventricular volume overload. Left ventricular diastolic function could not be evaluated due to atrial fibrillation. Left ventricular diastolic function could not be evaluated. Right Ventricle: The right ventricular size is severely enlarged. Right vetricular wall thickness was not well visualized. Right ventricular systolic function is severely reduced. There is severely elevated pulmonary artery systolic pressure. The tricuspid regurgitant velocity is 3.37 m/s, and with an assumed right atrial pressure of 15 mmHg, the estimated right ventricular systolic pressure is 03.5 mmHg. Left Atrium: Left atrial size was normal in size. Right Atrium: Right atrial size was normal in size. Pericardium: There is no evidence of pericardial effusion. Mitral Valve: The mitral valve is normal in structure. No evidence of mitral valve regurgitation. Tricuspid Valve: The tricuspid valve is grossly normal. Tricuspid valve regurgitation is mild to moderate. Aortic Valve: The aortic valve is calcified. Aortic valve regurgitation is not visualized. Mild aortic stenosis is present. Aortic valve peak gradient measures 14.1 mmHg. Pulmonic Valve: The pulmonic valve was normal in structure. Pulmonic valve regurgitation is trivial. Aorta: The aortic root and ascending aorta are structurally normal, with no evidence of  dilitation. Venous:  The inferior vena cava is dilated in size with less than 50% respiratory variability, suggesting right atrial pressure of 15 mmHg. IAS/Shunts: The atrial septum is grossly normal.  LEFT VENTRICLE PLAX 2D LVIDd:         4.10 cm LVIDs:         3.30 cm LV PW:         1.20 cm LV IVS:        1.10 cm LVOT diam:     1.60 cm LV SV:         30 LV SV Index:   12 LVOT Area:     2.01 cm  RIGHT VENTRICLE          IVC RV Basal diam:  3.70 cm  IVC diam: 2.80 cm RV Mid diam:    5.40 cm TAPSE (M-mode): 1.0 cm LEFT ATRIUM             Index        RIGHT ATRIUM           Index LA diam:        4.00 cm 1.64 cm/m   RA Area:     26.90 cm LA Vol (A2C):   57.5 ml 23.53 ml/m  RA Volume:   93.60 ml  38.31 ml/m LA Vol (A4C):   60.2 ml 24.64 ml/m LA Biplane Vol: 61.9 ml 25.33 ml/m  AORTIC VALVE                 PULMONIC VALVE AV Area (Vmax): 0.85 cm     PV Vmax:       0.52 m/s AV Vmax:        187.50 cm/s  PV Peak grad:  1.1 mmHg AV Peak Grad:   14.1 mmHg LVOT Vmax:      79.30 cm/s LVOT Vmean:     44.700 cm/s LVOT VTI:       0.148 m  AORTA Ao Asc diam: 3.50 cm MITRAL VALVE                TRICUSPID VALVE MV Area (PHT): 4.63 cm     TR Peak grad:   45.4 mmHg MV Decel Time: 164 msec     TR Vmax:        337.00 cm/s MV E velocity: 133.00 cm/s                             SHUNTS                             Systemic VTI:  0.15 m                             Systemic Diam: 1.60 cm Mertie Moores MD Electronically signed by Mertie Moores MD Signature Date/Time: 09/12/2021/1:16:37 PM    Final    DG Chest 2 View  Result Date: 09/11/2021 CLINICAL DATA:  Shortness of breath. EXAM: CHEST - 2 VIEW COMPARISON:  February 02, 2021 FINDINGS: Mild to moderate severity enlargement of the cardiac silhouette is seen. This is stable in appearance. Mild areas of atelectasis are seen within the mid left lung and bilateral lung bases. Mild perihilar pulmonary vascular prominence is also noted. There is no evidence of a pleural effusion or  pneumothorax. No acute osseous abnormalities are seen. IMPRESSION: 1. Mild to moderate severity  cardiomegaly with mild pulmonary vascular congestion. 2. Mild bilateral atelectasis. Electronically Signed   By: Virgina Norfolk M.D.   On: 09/11/2021 18:06    Microbiology: Results for orders placed or performed during the hospital encounter of 02/02/21  Resp Panel by RT-PCR (Flu A&B, Covid) Nasopharyngeal Swab     Status: None   Collection Time: 02/02/21 11:43 AM   Specimen: Nasopharyngeal Swab; Nasopharyngeal(NP) swabs in vial transport medium  Result Value Ref Range Status   SARS Coronavirus 2 by RT PCR NEGATIVE NEGATIVE Final    Comment: (NOTE) SARS-CoV-2 target nucleic acids are NOT DETECTED.  The SARS-CoV-2 RNA is generally detectable in upper respiratory specimens during the acute phase of infection. The lowest concentration of SARS-CoV-2 viral copies this assay can detect is 138 copies/mL. A negative result does not preclude SARS-Cov-2 infection and should not be used as the sole basis for treatment or other patient management decisions. A negative result may occur with  improper specimen collection/handling, submission of specimen other than nasopharyngeal swab, presence of viral mutation(s) within the areas targeted by this assay, and inadequate number of viral copies(<138 copies/mL). A negative result must be combined with clinical observations, patient history, and epidemiological information. The expected result is Negative.  Fact Sheet for Patients:  EntrepreneurPulse.com.au  Fact Sheet for Healthcare Providers:  IncredibleEmployment.be  This test is no t yet approved or cleared by the [place] FDA and  has been authorized for detection and/or diagnosis of SARS-CoV-2 by FDA under an Emergency Use Authorization (EUA). This EUA will remain  in effect (meaning this test can be used) for the duration of the COVID-19 declaration under  Section 564(b)(1) of the Act, 21 U.S.C.section 360bbb-3(b)(1), unless the authorization is terminated  or revoked sooner.       Influenza A by PCR NEGATIVE NEGATIVE Final   Influenza B by PCR NEGATIVE NEGATIVE Final    Comment: (NOTE) The Xpert Xpress SARS-CoV-2/FLU/RSV plus assay is intended as an aid in the diagnosis of influenza from Nasopharyngeal swab specimens and should not be used as a sole basis for treatment. Nasal washings and aspirates are unacceptable for Xpert Xpress SARS-CoV-2/FLU/RSV testing.  Fact Sheet for Patients: EntrepreneurPulse.com.au  Fact Sheet for Healthcare Providers: IncredibleEmployment.be  This test is not yet approved or cleared by the [place] FDA and has been authorized for detection and/or diagnosis of SARS-CoV-2 by FDA under an Emergency Use Authorization (EUA). This EUA will remain in effect (meaning this test can be used) for the duration of the COVID-19 declaration under Section 564(b)(1) of the Act, 21 U.S.C. section 360bbb-3(b)(1), unless the authorization is terminated or revoked.  Performed at [facility], [address] [place], [place] [address]     Labs: CBC: Recent Labs  Lab 09/17/21 1631 09/18/21 0632  WBC 24.3* 19.7*  HGB 11.6* 10.8*  HCT 35.1* 33.6*  MCV 90.9 92.1  PLT 145* 194   Basic Metabolic Panel: Recent Labs  Lab 09/15/21 0359 09/16/21 0226 09/17/21 0317 09/17/21 1142 09/18/21 0632 09/19/21 0245  NA 139 139 139  --  139 136  K 3.7 3.6 3.6  --  3.4* 3.7  CL 99 97* 96*  --  96* 94*  CO2 30 30 31   --  31 29  GLUCOSE 132* 154* 212*  --  126* 138*  BUN 46* 47* 50*  --  52* 58*  CREATININE 2.11* 2.19* 2.22*  --  2.38* 2.47*  CALCIUM 9.8 10.3 10.1  --  9.8 9.7  MG  --   --   --  2.6*  --   --    CBG: Recent Labs  Lab 09/18/21 1132 09/18/21 1621 09/18/21 2201 09/19/21 0602 09/19/21 1106  GLUCAP 184* 155* 120* 120* 224*    Discharge time  spent: greater than 30 minutes.  Signed: Barton Dubois, MD [facility] 09/19/2021

## 2021-09-19 NOTE — Progress Notes (Addendum)
Heart Failure Stewardship Pharmacist Progress Note   PCP: Prince Solian, MD PCP-Cardiologist: Mertie Moores, MD    HPI:  76 yo F with PMH of afib, NICM, CHF, T2DM, HTN, HLD, hypothyroidism, and sleep apnea. She presented to the ED on 6/22 with shortness of breath, LE edema, orthopnea, and lightheadedness. Recently, her outpatient nephrologist stopped her PTA metolazone due to worsening renal function but she had worsening edema because of this. CXR with mild to moderate cardiomegaly with mild pulmonary vascular congestion. An ECHO was done on 6/23 and LVEF 55-60% with mild LVH, severely reduced RV, and mild AS. Compared to her last ECHO in 07/2020, LVEF somewhat improved from 45-50%. She has had notable RV systolic dysfunction since her ECHO in 05/2018.  Current HF Medications: Diuretic: furosemide 120 mg PO BID + metolazone 5 mg three times weekly Beta Blocker: metoprolol XL 25 mg daily  Prior to admission HF Medications: Diuretic: furosemide 120 mg BID Beta blocker: metoprolol tartrate 25 mg qhs Aldosterone Antagonist: spironolactone 25 mg daily  Pertinent Lab Values: Serum creatinine 2.47, BUN 58, Potassium 3.7, Sodium 136, BNP 368.7, Magnesium 2.7, A1c 7.0   Vital Signs: Weight: 284 lbs (admission weight: 298 lbs) Blood pressure: 100-110/60s  Heart rate: 90s - afib  I/O: -1L yesterday; net -12.9L  Medication Assistance / Insurance Benefits Check: Does the patient have prescription insurance?  Yes Type of insurance plan: [place] [employer]  Outpatient Pharmacy:  Prior to admission outpatient pharmacy: [facility] Is the patient willing to use [facility] at discharge? Yes Is the patient willing to transition their outpatient pharmacy to utilize a [facility] outpatient pharmacy?   Pending    Assessment: 1. Acute on chronic diastolic CHF (LVEF 72-82%). NYHA class II symptoms. Symptoms due to severe RV dysfunction and de-escalation of diuretics as outpatient. - Volume status  improved. Continue furosemide 120 mg PO BID + metolazone 2.5 mg three times weekly (MWF). K replaced with 40 mEq x 2 since she will be getting metolazone today. - Continue metoprolol XL 25 mg daily - No ACE/ARB/ARNI with advanced CKD - Consider restarting spironolactone prior to discharge if renal function allows - eGFR <25 - cannot add Farxiga  - eGFR 20 today - cannot start Jardiance if <20. UA clear of infection.   Plan: 1) Medication changes recommended at this time: - KCl 40 mEq x 2 since metolazone will be given today - Ensure KCl on discharge today - 40 mEq daily; 80 mEq on MWF with metolazone  2) Patient assistance: - Entresto copay $25 - Farxiga/Jardiance copay $25  3)  Education  - Patient has been educated on current HF medications and potential additions to HF medication regimen - Patient verbalizes understanding that over the next few months, these medication doses may change and more medications may be added to optimize HF regimen - Patient has been educated on basic disease state pathophysiology and goals of therapy   Kerby Nora, PharmD, BCPS Heart Failure Stewardship Pharmacist Phone [phone]

## 2021-09-19 NOTE — TOC Transition Note (Signed)
Transition of Care [facility]) - CM/SW Discharge Note   Patient Details  Name: Kristine [name] MRN: [id number] Date of Birth: March 24, 1945  [facility] CM/SW Contact:  Zenon Mayo, RN Phone Number: 09/19/2021, 12:25 PM   Clinical Narrative:    Patient is for dc today, NCM notified Levada Dy with [place].  Patient has transport home at dc.     Final next level of care: [place] Barriers to Discharge: Continued Medical Work up   Patient Goals and CMS Choice Patient states their goals for this hospitalization and ongoing recovery are:: return home CMS Medicare.gov Compare Post Acute Care list provided to:: Patient Choice offered to / list presented to : Patient  Discharge Placement                       Discharge Plan and Services In-house Referral: NA Discharge Planning Services: CM Consult Post Acute Care Choice: Home Health            DME Agency: NA       HH Arranged: RN, OT, PT, Disease Management [place] Agency: [place] Date [facility] Agency Contacted: 09/12/21 Time [place]: 1316 Representative spoke with at [place]: Clarise Cruz  Social Determinants of Health (SDOH) Interventions Food Insecurity Interventions: Intervention Not Indicated Financial Strain Interventions: Other (Comment) (Medication cost concerns) Housing Interventions: Intervention Not Indicated Transportation Interventions: Intervention Not Indicated   Readmission Risk Interventions    09/12/2021   12:58 PM  Readmission Risk Prevention Plan  Transportation Screening Complete  PCP or Specialist Appt within 3-5 Days Complete  Social Work Consult for [place] Planning/Counseling Complete  Palliative Care Screening Not Applicable  Medication Review Press photographer) Complete

## 2021-09-19 NOTE — Progress Notes (Signed)
Pt discharging home with her husband. All of her belongings were returned to her. She is alert and oriented x 4 but crying stating she never wanted to get heart failure. Pt reassured and listened to by nurse. Therapeutic communication was used to comfort and provide support.  Education on heart failure and low sodium diet was given. In the room before discharging, pt was well educated on her medications and the changes as ordered by the MD. Husband was educated as well stating he takes an active role in her medication therarpy.  Pt was informed of her appointments after discharge. Pt was transported off the unit by a member of the volunteer team via wheelchair. Pt thanked me for the care that was given to her.  VS were taken prior to discharge and  all were within normal limits.

## 2021-09-19 NOTE — Progress Notes (Signed)
Progress Note  Patient Name: Kristine [name] Date of Encounter: 09/19/2021  [facility] Cardiologist: Mertie Moores, MD   Subjective   Feels good today. Cr slightly rising from 2.38>2.47, BUN 2.38>2.47. States she would like to go home today.   Inpatient Medications    Scheduled Meds:  allopurinol  300 mg Oral q AM   DULoxetine  30 mg Oral q AM   furosemide  120 mg Oral BID   insulin aspart  0-9 Units Subcutaneous TID WC   insulin glargine-yfgn  30 Units Subcutaneous BID   levothyroxine  88 mcg Oral q morning   [START ON 09/22/2021] metolazone  2.5 mg Oral Once per day on Mon Wed Fri   metoprolol succinate  25 mg Oral Daily   nystatin cream   Topical BID   polyethylene glycol  17 g Oral BID   potassium chloride  40 mEq Oral BID   pregabalin  50 mg Oral BID   Rivaroxaban  15 mg Oral Daily   sodium chloride flush  3 mL Intravenous Q12H   Continuous Infusions:  sodium chloride 250 mL (09/15/21 2307)   PRN Meds: sodium chloride, acetaminophen **OR** acetaminophen, ALPRAZolam, bisacodyl, HYDROcodone-acetaminophen, sodium chloride flush, traMADol   Vital Signs    Vitals:   09/18/21 2000 09/18/21 2100 09/19/21 0554 09/19/21 0826  BP:  116/68 109/83 (!) 103/54  Pulse: 85 87 91 90  Resp:  '20 20 20  '$ Temp:  (!) 97.5 F (36.4 C) (!) 97.4 F (36.3 C) 97.8 F (36.6 C)  TempSrc:  Oral Oral Oral  SpO2: 98% 98% 95% 99%  Weight:   129.2 kg   Height:        Intake/Output Summary (Last 24 hours) at 09/19/2021 1002 Last data filed at 09/19/2021 0908 Gross per 24 hour  Intake 836 ml  Output 1250 ml  Net -414 ml       09/19/2021    5:54 AM 09/18/2021    5:16 AM 09/17/2021    3:00 AM  Last 3 Weights  Weight (lbs) 284 lb 12.8 oz 283 lb 4.8 oz 283 lb 1.6 oz  Weight (kg) 129.184 kg 128.504 kg 128.413 kg      Telemetry    Afib, rate controlled. Occasional PVCs- Personally Reviewed  ECG    No new tracing today- Personally Reviewed  Physical Exam   GEN: Laying in  bed, comfortabale Neck: No JVD Cardiac: Irregular, 2/6 systolic murmur Respiratory: Clear to auscultation bilaterally. GI: Obese, soft MS: Trace ankle edema, warm Neuro:  Nonfocal  Psych: Normal affect   Labs    High Sensitivity Troponin:   Recent Labs  Lab 09/11/21 1730 09/11/21 2038  TROPONINIHS 51* 51*      Chemistry Recent Labs  Lab 09/17/21 0317 09/17/21 1142 09/18/21 0632 09/19/21 0245  NA 139  --  139 136  K 3.6  --  3.4* 3.7  CL 96*  --  96* 94*  CO2 31  --  31 29  GLUCOSE 212*  --  126* 138*  BUN 50*  --  52* 58*  CREATININE 2.22*  --  2.38* 2.47*  CALCIUM 10.1  --  9.8 9.7  MG  --  2.6*  --   --   GFRNONAA 23*  --  21* 20*  ANIONGAP 12  --  12 13     Lipids No results for input(s): "CHOL", "TRIG", "HDL", "LABVLDL", "LDLCALC", "CHOLHDL" in the last 168 hours.  Hematology Recent Labs  Lab 09/17/21  1631 09/18/21 0632  WBC 24.3* 19.7*  RBC 3.86* 3.65*  HGB 11.6* 10.8*  HCT 35.1* 33.6*  MCV 90.9 92.1  MCH 30.1 29.6  MCHC 33.0 32.1  RDW 19.3* 19.1*  PLT 145* 150    Thyroid  No results for input(s): "TSH", "FREET4" in the last 168 hours.   BNP No results for input(s): "BNP", "PROBNP" in the last 168 hours.   DDimer No results for input(s): "DDIMER" in the last 168 hours.   Radiology    DG Chest 2 View  Result Date: 09/17/2021 CLINICAL DATA:  Shortness of breath with edema. EXAM: CHEST - 2 VIEW COMPARISON:  Chest x-ray 09/11/2021 FINDINGS: The heart is enlarged, unchanged. Mediastinal silhouette is stable. There is no focal lung infiltrate, pleural effusion or pneumothorax. There is central pulmonary vascular congestion. No acute fractures are seen. IMPRESSION: 1. Cardiomegaly with central pulmonary vascular congestion. Electronically Signed   By: Ronney Asters M.D.   On: 09/17/2021 19:04    Cardiac Studies   TTE 08/08/20: IMPRESSIONS   1. Left ventricular ejection fraction, by estimation, is 45 to 50%. The  left ventricle has mildly  decreased function. Left ventricular endocardial  border not optimally defined to evaluate regional wall motion. Left  ventricular diastolic parameters are  indeterminate. There is the interventricular septum is flattened in  systole and diastole, consistent with right ventricular pressure and  volume overload.   2. Right ventricular systolic function is severely reduced. The right  ventricular size is moderately enlarged. There is moderately elevated  pulmonary artery systolic pressure. The estimated right ventricular  systolic pressure is 33.2 mmHg.   3. Right atrial size was mildly dilated.   4. Moderate pericardial effusion. The pericardial effusion is posterior  to the left ventricle.   5. The mitral valve is abnormal. Mild to moderate mitral valve  regurgitation.   6. Tricuspid valve regurgitation is mild to moderate.   7. The aortic valve is tricuspid. There is moderate calcification of the  aortic valve. Aortic valve regurgitation is not visualized.   8. The inferior vena cava is dilated in size with <50% respiratory  variability, suggesting right atrial pressure of 15 mmHg.   Comparison(s): Echocardiogram done 06/04/18 showed an EF of 50-55%.   Patient Profile     76 y.o. female with history of persistent atrial fibrillation, non-ischemic cardiomyopathy, chronic systolic CHF, CKD, DM, HTN, hypothyroidism, HLD and sleep apnea who presented with worsening LE edema and shortness of breath consistent with acute on chronic biventricular heart failure exacerbation for which Cardiology was consulted.   Assessment & Plan    #Acute on Chronic Right Sided HF: #Acute on Chronic Heart Failure Improved EF: Patient with mainly right sided symptoms in the setting of severe RV enlargement and severe RV dysfunction likely triggered by down-titration of diuretics as outpatient. Also with history of mildly reduced EF that improved to 60-65% on most recent TTE. Responded well to IV diuresis and  metolazone now transitioned to PO lasix '120mg'$  BID and metolazone 2.'5mg'$  M, W, F. Cr bumped slightly today. Will hold PM dose of lasix and ensure she has repeat BMET within 1 week of discharge. Can likely discharge home today with close follow-up with HF clinic on 10/01/21 -Hold PM dose of lasix tonight and tomorrow AM given bump in Cr; she can take AM dose of lasix tomorrow morning if weight increases -Home regimen will be: lasix '120mg'$  BID and metolazone 2.'5mg'$  every M, W, F -Plan for potassium 47mq daily on  the days she takes lasix only and 32mq daily on the days she takes metolazone -Will arrange for BMET on Monday -Continue metop 12.'5mg'$  XL daily  -Monitor daily weights at home  #Persistent Afib: Overall well rate controlled.  -Continue metop 12.'5mg'$  XL daily -Continue xarelto '15mg'$  daily  #HTN: -Continue metop 12.'5mg'$  XL daily   #CKD IV: -Diuresis as above  Okay to discharge home today. Will plan to hold PM dose of lasix tonight and AM dose tomorrow UNLESS she gains weight overnight and then she will take AM dose of lasix. Will need K supplementation on discharge. Will arrange for BMET next week and follow-up in clinic. Plan discussed extensively with the patient and her husband at length. She knows to watch her weights and if she is gaining weight to let [place] as soon as possible to prevent need for repeat hospitalization.      For questions or updates, please contact [place]Please consult [email or web address] for contact info under        Signed, HFreada Bergeron MD  09/19/2021, 10:02 AM

## 2021-09-19 NOTE — Progress Notes (Signed)
Mobility Specialist Progress Note:   09/19/21 1155  Mobility  Activity Ambulated with assistance to bathroom  Level of Assistance Standby assist, set-up cues, supervision of patient - no hands on  Assistive Device Front wheel walker  Distance Ambulated (ft) 20 ft  Activity Response Tolerated well  $Mobility charge 1 Mobility   Pt received in chair willing to participate in mobility. Pt asking to use BR before hallway ambulation. Left in bathroom and was told to pull string when finished.   [facility] Leani Myron Mobility Specialist

## 2021-09-19 NOTE — Progress Notes (Signed)
Mobility Specialist Progress Note:   09/19/21 1208  Mobility  Activity Ambulated with assistance in hallway  Level of Assistance Standby assist, set-up cues, supervision of patient - no hands on  Assistive Device Front wheel walker  Distance Ambulated (ft) 60 ft  Activity Response Tolerated well  $Mobility charge 1 Mobility   Pt agreeable to walk. No complaints of pain. Left in chair with call bell in reach and all needs met.   [facility] Kyeisha Janowicz Mobility Specialist

## 2021-09-19 NOTE — Progress Notes (Signed)
Occupational Therapy Treatment Patient Details Name: Kristine [name] MRN: [id number] DOB: 1946-02-04 Today's Date: 09/19/2021   History of present illness 76 y.o. female presents to [facility] hospital on 09/11/2021 with worsening edema and SOB after her diuretic was recently reduced. Admitted 09/11/2021 for CHF exacerbation. PMH includes CKD, afib, COPD, GERD, HTN, DMII, PAH, Charcot's foot.   OT comments  Patient received in bed and agreeable to OT session. Patient able to get to EOB with supervision and increased time.Patient performed transfer to recliner with min guard assist. Patient setup for grooming and UB bathing seated in recliner. Patient performed tasks seated due to fatigue. Patient is a possible discharge to home and is expected to be followed by [place].    Recommendations for follow up therapy are one component of a multi-disciplinary discharge planning process, led by the attending physician.  Recommendations may be updated based on patient status, additional functional criteria and insurance authorization.    Follow Up Recommendations  Home health OT    Assistance Recommended at Discharge Intermittent Supervision/Assistance  Patient can return home with the following  A little help with walking and/or transfers;A little help with bathing/dressing/bathroom;Assistance with cooking/housework;Assist for transportation;Help with stairs or ramp for entrance   Equipment Recommendations  None recommended by OT    Recommendations for Other Services      Precautions / Restrictions Precautions Precautions: Fall Required Braces or Orthoses: Other Brace Other Brace: diabetic shoes Restrictions Weight Bearing Restrictions: No       Mobility Bed Mobility Overal bed mobility: Needs Assistance Bed Mobility: Supine to Sit     Supine to sit: Supervision     General bed mobility comments: supervision and increased time    Transfers Overall transfer level: Needs  assistance Equipment used: Rolling walker (2 wheels) Transfers: Sit to/from Stand Sit to Stand: Min assist     Step pivot transfers: Min guard     General transfer comment: min assist to stand from EOB and min guard to transfer to recliner     Balance Overall balance assessment: Needs assistance Sitting-balance support: Feet supported, No upper extremity supported Sitting balance-Leahy Scale: Good     Standing balance support: Bilateral upper extremity supported, Reliant on assistive device for balance Standing balance-Leahy Scale: Poor Standing balance comment: reliant on RW for support                           ADL either performed or assessed with clinical judgement   ADL Overall ADL's : Needs assistance/impaired     Grooming: Wash/dry hands;Wash/dry face;Oral care;Brushing hair;Set up;Sitting Grooming Details (indicate cue type and reason): performed seated due to fatigue Upper Body Bathing: Set up;Sitting                             General ADL Comments: increased time due to fatigue    Extremity/Trunk Assessment              Vision       Perception     Praxis      Cognition Arousal/Alertness: Awake/alert Behavior During Therapy: WFL for tasks assessed/performed Overall Cognitive Status: Within Functional Limits for tasks assessed Area of Impairment: Memory                     Memory: Decreased short-term memory  Exercises      Shoulder Instructions       General Comments      Pertinent Vitals/ Pain       Pain Assessment Pain Assessment: Faces Faces Pain Scale: No hurt Pain Intervention(s): Monitored during session  Home Living                                          Prior Functioning/Environment              Frequency  Min 2X/week        Progress Toward Goals  OT Goals(current goals can now be found in the care plan section)  Progress towards OT  goals: Progressing toward goals  Acute Rehab OT Goals Patient Stated Goal: go home OT Goal Formulation: With patient Time For Goal Achievement: 09/26/21 Potential to Achieve Goals: Good ADL Goals Pt Will Perform Grooming: with modified independence;standing Pt Will Perform Lower Body Bathing: with min guard assist;with adaptive equipment;sitting/lateral leans;sit to/from stand Pt Will Perform Lower Body Dressing: with min guard assist;sitting/lateral leans;sit to/from stand;with adaptive equipment Pt Will Transfer to Toilet: with min guard assist;ambulating Pt Will Perform Toileting - Clothing Manipulation and hygiene: with min guard assist;sitting/lateral leans;sit to/from stand  Plan Discharge plan remains appropriate    Co-evaluation                 AM-PAC OT "6 Clicks" Daily Activity     Outcome Measure   Help from another person eating meals?: None Help from another person taking care of personal grooming?: A Little Help from another person toileting, which includes using toliet, bedpan, or urinal?: A Little Help from another person bathing (including washing, rinsing, drying)?: A Lot Help from another person to put on and taking off regular upper body clothing?: None Help from another person to put on and taking off regular lower body clothing?: A Lot 6 Click Score: 18    End of Session Equipment Utilized During Treatment: Rolling walker (2 wheels)  OT Visit Diagnosis: Unsteadiness on feet (R26.81);Other abnormalities of gait and mobility (R26.89);Muscle weakness (generalized) (M62.81)   Activity Tolerance Patient tolerated treatment well   Patient Left in chair;with call bell/phone within reach;with family/visitor present   Nurse Communication Mobility status        Time: 4193-7902 OT Time Calculation (min): 31 min  Charges: OT General Charges $OT Visit: 1 Visit OT Treatments $Self Care/Home Management : 23-37 mins  Lodema Hong, [place]  Office [place] 09/19/2021, 12:09 PM

## 2021-09-22 ENCOUNTER — Telehealth: Payer: Self-pay | Admitting: Nurse Practitioner

## 2021-09-22 ENCOUNTER — Other Ambulatory Visit: Payer: Medicare HMO | Admitting: *Deleted

## 2021-09-22 DIAGNOSIS — I5032 Chronic diastolic (congestive) heart failure: Secondary | ICD-10-CM

## 2021-09-22 NOTE — Telephone Encounter (Signed)
R/s pt's new hem appt. Pt is aware of new appt date and time.  °

## 2021-09-23 LAB — BASIC METABOLIC PANEL
BUN/Creatinine Ratio: 26 (ref 12–28)
BUN: 62 mg/dL — ABNORMAL HIGH (ref 8–27)
CO2: 27 mmol/L (ref 20–29)
Calcium: 9.9 mg/dL (ref 8.7–10.3)
Chloride: 93 mmol/L — ABNORMAL LOW (ref 96–106)
Creatinine, Ser: 2.36 mg/dL — ABNORMAL HIGH (ref 0.57–1.00)
Glucose: 188 mg/dL — ABNORMAL HIGH (ref 70–99)
Potassium: 3.5 mmol/L (ref 3.5–5.2)
Sodium: 137 mmol/L (ref 134–144)
eGFR: 21 mL/min/{1.73_m2} — ABNORMAL LOW (ref >59)

## 2021-09-24 ENCOUNTER — Telehealth: Payer: Self-pay

## 2021-09-24 NOTE — Telephone Encounter (Signed)
Patient informed of lab report from Dr. Johney Frame.Marland KitchenMarland KitchenKidney function and electrolytes look stable."  Patient had no questions or concerns at this time.

## 2021-09-26 ENCOUNTER — Encounter: Payer: Self-pay | Admitting: Cardiovascular Disease

## 2021-09-26 ENCOUNTER — Ambulatory Visit: Payer: Medicare HMO | Admitting: Cardiovascular Disease

## 2021-09-26 VITALS — BP 106/67 | HR 84 | Ht 69.0 in | Wt 278.0 lb

## 2021-09-26 DIAGNOSIS — G4733 Obstructive sleep apnea (adult) (pediatric): Secondary | ICD-10-CM | POA: Diagnosis not present

## 2021-09-26 DIAGNOSIS — I5032 Chronic diastolic (congestive) heart failure: Secondary | ICD-10-CM | POA: Diagnosis not present

## 2021-09-26 NOTE — Progress Notes (Signed)
Cardiology Office Note   Date:  09/26/2021   ID:  Kristine [name], Kristine [name] 1946-03-17, MRN [id number]  PCP:  Kristine Solian, MD  Cardiologist:   Kristine Moores, MD  - will be reassigning to Dr. Haroldine [name] due to severe pulmonary HTN, chronic diastolic CHF   Chief Complaint  Patient presents with   Congestive Heart Failure        Atrial Fibrillation        1. Atrial fibrillation 2. Diabetes mellitus 3. Chronic diastolic CHF 4. Obesity.      Kristine [name] is a 76 year old female with a history of atrial fibrillation. She has done well since I last saw her.  She's not had any episodes of chest pain or shortness breath. Her blood pressure readings have been normal.  She is tolerating the Xarelto fairly well.  She has some occasional bleeding from the gums.  She enjoys not having to check her INR levels.     She has occasional palpitations and heart racing.  This typically can rest and these will resolve.  She is able to wak about 10 minutes at a time.   Oct. 24, 2014:  June 23, 2013:   Kristine [name]'s BP is elevated.  She had a recent echocardiogram that revealed a left ventricle systolic function that was at the lower limits of normal-50%. Study Conclusions  - Left ventricle: The cavity size was normal. Wall thickness was increased in a pattern of mild LVH. The estimated ejection fraction was 50%. Diffuse hypokinesis. Features are consistent with a pseudonormal left ventricular filling pattern, with concomitant abnormal relaxation and increased filling pressure (grade 2 diastolic dysfunction). - Aortic valve: There was no stenosis. - Mitral valve: Mildly calcified annulus. Mildly calcified leaflets . Trivial regurgitation. - Left atrium: The atrium was moderately dilated. - Right ventricle: The cavity size was normal. Systolic function was mildly reduced. - Right atrium: The atrium was moderately dilated. - Systemic veins: IVC measured 1.7 cm with < 50% respirophasic variation,  suggesting RA pressure 8 mmHg. - Pericardium, extracardiac: A trivial pericardial effusion was identified posterior to the heart  She was having some abdominal pain and   September 29, 2013:  Kristine [name] is doing well.  Stressed about coming here.   Jan. 7, 2015:  Kristine [name] is a 76 yo with hx of Afib and diastolic dysfunction She converted to NSR in the hospital with flecainide. She was tried on Kristine [name] but her QT interval prolonged.  Was changed to Flecainide and has maintained NSR.  She is exercising regularly - 30 minutes a day. Is on Lasix 80 mg a day and mtalazone 2. 5 once a week.  Is still retaining fluid. Trying to avoid salt in food.     June 11, 2014:   Kristine [name] is a 76 y.o. female who presents for follow-up of her atrial fibrillation. She has had some issues with fluid retention and  has been on metalazone.   Sept. 19, 2016:  Doing well.  Has had some shortness of breath - improved with increasing lasix to 80 BID .  BP is still high this am.  BP at home has been normal .  June 18, 2015:  She has tried Kristine [name] but had prolonged QT - so it was stopped Tried on flecainide Was tried on amiodarone  Had a cardioversion in Dec.   Amiodarone dose was lowered to 200 mg a day and she is now back in atrial fib She is not as symptomatic today  She is  very fatigued. Has not been sleeping well at all Has lots of arm fatigue in her arms with any activity   Nov. 2, 2017:    Still short of breath No CP  Active,  Has a pins and needle sensation   August 27, 2016:  Kristine [name] is seen back today for follow up of her atrial fib  Has failed Flecainide, tikosyn and now  Is on amiodarone.   We had her on a reduced dose of amiodarone due to symptoms of peripheral neuropathy. The dose was increased up to 200 mg daily by Dr. Rayann [name].  She is tentatively scheduled for cardioversion. She does not want to consider an ablation .   February 25, 2017:  Kristine [name] is seen back today for her  chronic atrial fibrillation. She has discontinued the amiodarone.  She seems to be tolerating the atrial fibrillation quite well.  We have adopted a strategy of anticoagulation and rate control.  She developed acute on chronic diastolic CHF last Thursday.   She has been on Lyrica  Went to the ER.   She took an extra Lasix on the way there and urinated all of the extra weight  She still takes metolazone very rarely - perhaps   Aug 06, 2017:  Had some weakness while coming in this afternoon.    Had fasted all day - until 2 PM  Feeling better.  Breathing is good   She is had lots of leg edema.  She has been seeing Dr. Florene [name].  She now takes Lasix 80 mg 3 times a day.  She takes metolazone 2.5 mg 3 days a week.  February 01, 2018  Is having problem of her Charcot foot.  Here for pre op visit Echo in Oct. Showed EF of 45-50%.    She has grade 2 diastolic dysfunction by previous echo .  Moderate pulmonary HTN.  June 29, 2019:  Kristine [name] is seen today for follow-up of her chronic combined systolic and diastolic congestive heart failure.  Echocardiogram showed an EF of 45 to 50%.  She has grade 2 diastolic dysfunction.  She has moderate pulmonary hypertension.   Walks with a walker,  Balance issues from charcot foot issues  [place] and injured her left knee 2 months ago .  No cardiac complaints.  Has lots of neurophathyu  -  Foot pain and hand pain . Breathing is good,  No CP  Lipids are reviewed.  Still eating  Salty foods .    July 16, 2020: Has 12 lb weight gain,  Leg edema,  Shortness of breath  Still eating salty foods almost daily .  Takes Lasix  80 mg bid  Is still making lots of urine,  Also takes metolazone 2.5 on M W F Echo in 2020 shows mildly reduced LV function with EF 45-50%. unmable to determine diastolic function due to Af ib   Creatinine is 2.  Not sure we can add entresto or ARB  Is volume overloaded. Still eatis salt   September 26, 2021  Was hospitalized 2 weeks ago   with CHF Has dx of OSA but does not have a CPAP .   Has been seen in the CHF clinic by Dr. Haroldine [name] . Scheduled to see the CHF Inpact clinic on July 12.   Wt today is 278 lbs  Breathing is better since the hospitalization       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   Anxiety  Arthritis    Asthma    Atrial fibrillation ([place])    CAP (community acquired pneumonia) 05/31/2015   Cataract    CHF (congestive heart failure) (HCC)    Chronic bronchitis (HCC)    Chronic kidney disease    Clotting disorder ([place])    Community acquired pneumonia 05/31/2015   Depression    Dysrhythmia    GERD (gastroesophageal reflux disease)    High cholesterol    History of hiatal hernia    HTN (hypertension)    Mild aortic sclerosis    Morbid obesity ([place])    Neuromuscular disorder ([place])    Neuropathic pain    Osteoporosis    Persistent atrial fibrillation ([place])    Scoliosis    Sleep apnea    Spinal stenosis    Thyroid disease    Type II diabetes mellitus ([place])    Vitamin D deficiency     Past Surgical History:  Procedure Laterality Date   BLADDER SUSPENSION  1980s   CARDIAC CATHETERIZATION  11/16/09   SMOOTH AND NORMAL   CARDIOVERSION N/A 01/07/2015   Procedure: CARDIOVERSION;  Surgeon: Skeet Latch, MD;  Location: [facility] ENDOSCOPY;  Service: Cardiovascular;  Laterality: N/A;   CARDIOVERSION N/A 03/11/2015   Procedure: CARDIOVERSION;  Surgeon: Skeet Latch, MD;  Location: [facility];  Service: Cardiovascular;  Laterality: N/A;   CARPAL TUNNEL RELEASE Left 1970s   CATARACT EXTRACTION W/ INTRAOCULAR LENS  IMPLANT, BILATERAL Bilateral ~ 2010   COLONOSCOPY  08/14/08   FINGER FRACTURE SURGERY Right 1970s   "ring finger"   FRACTURE SURGERY     HEMATOMA EVACUATION Left 05/21/2021   Procedure: EVACUATION LEFT KNEE HEMATOMA;  Surgeon: Lennice Sites, MD;  Location: [facility];  Service: Plastics;  Laterality: Left;   LAPAROSCOPIC CHOLECYSTECTOMY   1980s   TUBAL LIGATION     VAGINAL HYSTERECTOMY  1980     Current Outpatient Medications  Medication Sig Dispense Refill   acetaminophen (TYLENOL) 500 MG tablet Take 1,000 mg by mouth every 6 (six) hours as needed for moderate pain or headache.     allopurinol (ZYLOPRIM) 300 MG tablet Take 300 mg by mouth in the morning.     ALPRAZolam (XANAX) 0.5 MG tablet Take 0.5 mg by mouth every 6 (six) hours as needed for anxiety.     BD PEN NEEDLE NANO U/F 32G X 4 MM MISC Inject 1 each as directed See admin instructions. Use pen needles with insulin pens daily  11   colchicine 0.6 MG tablet Take 0.6 mg by mouth daily as needed (gout flares).     diclofenac Sodium (VOLTAREN) 1 % GEL Apply 4 g topically 4 (four) times daily as needed (back pain).     docusate sodium (COLACE) 100 MG capsule Take 100 mg by mouth daily as needed for mild constipation.     DULoxetine (CYMBALTA) 30 MG capsule Take 30 mg by mouth in the morning.     estradiol (ESTRACE) 0.1 MG/GM vaginal cream Place 1 Applicatorful vaginally as needed (vaginal irritation).     furosemide (LASIX) 80 MG tablet Take 1.5 tablets (120 mg total) by mouth 2 (two) times daily. 90 tablet 3   HYDROcodone-acetaminophen (NORCO/VICODIN) 5-325 MG tablet Take 1 tablet by mouth every 6 (six) hours as needed for severe pain. 6 tablet 0   insulin aspart (NOVOLOG FLEXPEN) 100 UNIT/ML FlexPen 40-70 Units See admin instructions. Less than 150 units 150=40 units151-199=45 units 200-249=50 units 250-299=55 units 300-349=60 units     Insulin Degludec (TRESIBA FLEXTOUCH)  200 UNIT/ML SOPN Inject 100 Units into the skin in the morning and at bedtime.     levothyroxine (SYNTHROID) 88 MCG tablet Take 88 mcg by mouth every morning.     lidocaine-prilocaine (EMLA) cream 1 gram qid prn 30 g 11   LYRICA 50 MG capsule Take 50 mg by mouth 2 (two) times daily.  3   metolazone (ZAROXOLYN) 2.5 MG tablet Take 1 tablet (2.5 mg total) by mouth 3 (three) times a week. Mon, Wed, Fri.  15 tablet 3   metoprolol succinate (TOPROL-XL) 50 MG 24 hr tablet Take 1/2 tablet (25 mg total) by mouth daily. 30 tablet 2   nystatin (MYCOSTATIN/NYSTOP) powder Apply 1 application topically as needed (skin irritation/rash).     ondansetron (ZOFRAN) 4 MG tablet Take 1 tablet (4 mg total) by mouth every 8 (eight) hours as needed for nausea or vomiting. 20 tablet 0   Polyethyl Glycol-Propyl Glycol (SYSTANE OP) Place 1 drop into both eyes 2 (two) times daily as needed (dry eyes).     polyethylene glycol (MIRALAX / GLYCOLAX) 17 g packet Take 17 g by mouth daily as needed (constipation.).     potassium chloride SA (KLOR-CON M) 20 MEQ tablet Take 2 tablets (40 mEq total) by mouth 2 (two) times daily. 120 tablet 2   REGRANEX 0.01 % gel Apply 1 Application topically daily. Apply to left foot     traMADol (ULTRAM) 50 MG tablet Take 50 mg by mouth 3 (three) times daily as needed (severe neuropathy pain.).     Vitamin D, Ergocalciferol, (DRISDOL) 50000 UNITS CAPS Take 50,000 Units by mouth every Monday.     XARELTO 15 MG TABS tablet Take 15 mg by mouth in the morning.     No current facility-administered medications for this visit.    Allergies:   Albuterol, Epinephrine, Penicillins, Ceftriaxone, Citalopram hydrobromide, Exenatide, Ketorolac tromethamine, Tizanidine, Torsemide, Zoledronic acid, Bee venom, Ivp dye [iodinated contrast media], and Keflex [cephalexin]    Social History:  The patient  reports that she has never smoked. She has never used smokeless tobacco. She reports that she does not drink alcohol and does not use drugs.   Family History:  The patient's family history includes Atrial fibrillation in her father; CAD in her brother and sister; Congestive Heart Failure in her mother; Heart failure in her mother; Hypertension in her brother, father, mother, sister, and son; Stroke in her father.    ROS:   Noted in current history, otherwise review of systems is negative.  Physical  Exam: Blood pressure 106/67, pulse 84, height '5\' 9"'$  (1.753 m), weight 278 lb (126.1 kg), SpO2 94 %.  GEN:  Well nourished, well developed in no acute distress HEENT: Normal NECK: No JVD; No carotid bruits LYMPHATICS: No lymphadenopathy CARDIAC: irreg. Irreg.  RESPIRATORY:  Clear to auscultation without rales, wheezing or rhonchi  ABDOMEN: Soft, non-tender, non-distended MUSCULOSKELETAL:  No edema; No deformity  SKIN: Warm and dry NEUROLOGIC:  Alert and oriented x 3   EKG:  July 16, 2020:   Afib with controlled  V response     Recent Labs: 09/11/2021: B Natriuretic Peptide 368.7 09/12/2021: ALT 9; TSH 5.314 09/17/2021: Magnesium 2.6 09/18/2021: Hemoglobin 10.8; Platelets 150 09/22/2021: BUN 62; Creatinine, Ser 2.36; Potassium 3.5; Sodium 137    Lipid Panel    Component Value Date/Time   CHOL 133 07/16/2020 1222   TRIG 156 (H) 07/16/2020 1222   HDL 32 (L) 07/16/2020 1222   CHOLHDL 4.2 07/16/2020 1222  CHOLHDL 4.9 06/04/2018 0535   VLDL 21 06/04/2018 0535   LDLCALC 74 07/16/2020 1222      Wt Readings from Last 3 Encounters:  09/26/21 278 lb (126.1 kg)  09/19/21 284 lb 12.8 oz (129.2 kg)  05/16/21 259 lb (117.5 kg)      Other studies Reviewed: Additional studies/ records that were reviewed today include: . Review of the above records demonstrates:    ASSESSMENT AND PLAN:  1. Atrial fibrillation -    stable ,  cont metoprolol   2.  . Chronic combined systolic and diastolic congestive heart failure.  She has chronic diastolic congestive heart failure and as result has secondary pulmonary hypertension.  She has seen Dr. Haroldine [name] in the heart failure clinic on several occasions.  We will reassign her to Dr. Haroldine [name] as primary cardiology.  She is scheduled to see the heart failure impact clinic next week.  I will plan on seeing her on an as-needed basis.  3. Obesity.     Advised weight loss   4.  CKD   -     5.  Charcot foot:  .  6.   Obstructive sleep apnea:  She has had a sleep study at [facility].  She does not have any particular doctor who is managing this.  We will refer her to our sleep clinic and Dr. Radford Pax..    Current medicines are reviewed at length with the patient today.  The patient does not have concerns regarding medicines.  The following changes have been made:  no change  Labs/ tests ordered today include:   Orders Placed This Encounter  Procedures   Ambulatory referral to Cardiology    Disposition:   FU with CHF clinic   Signed, Kristine Moores, MD  09/26/2021 5:24 PM    [address] [place], [place]  [address] Phone: [phone]; Fax: [phone]

## 2021-09-26 NOTE — Addendum Note (Signed)
Addended by: Freada Bergeron on: 09/26/2021 06:54 PM   Modules accepted: Orders

## 2021-09-26 NOTE — Patient Instructions (Signed)
Medication Instructions:  Your physician recommends that you continue on your current medications as directed. Please refer to the Current Medication list given to you today.  *If you need a refill on your cardiac medications before your next appointment, please call your pharmacy*   Lab Work: NONE If you have labs (blood work) drawn today and your tests are completely normal, you will receive your results only by: [place] (if you have MyChart) OR A paper copy in the mail If you have any lab test that is abnormal or we need to change your treatment, we will call you to review the results.   Testing/Procedures: Ambulatory Referral to [facility] for OSA   Follow-Up: At [facility], you and your health needs are our priority.  As part of our continuing mission to provide you with exceptional heart care, we have created designated Provider Care Teams.  These Care Teams include your primary Cardiologist (physician) and Advanced Practice Providers (APPs -  Physician Assistants and Nurse Practitioners) who all work together to provide you with the care you need, when you need it.  Your next appointment:   As needed ([place])   Important Information About Sugar

## 2021-10-01 ENCOUNTER — Encounter (HOSPITAL_COMMUNITY): Payer: Self-pay

## 2021-10-01 ENCOUNTER — Ambulatory Visit (HOSPITAL_COMMUNITY)
Admit: 2021-10-01 | Discharge: 2021-10-01 | Disposition: A | Discharge status: Home / Self Care | Payer: Medicare HMO | Attending: Cardiology | Admitting: Cardiology

## 2021-10-01 ENCOUNTER — Telehealth: Payer: Self-pay | Admitting: *Deleted

## 2021-10-01 VITALS — BP 126/70 | HR 84 | Ht 69.0 in | Wt 277.4 lb

## 2021-10-01 DIAGNOSIS — M4800 Spinal stenosis, site unspecified: Secondary | ICD-10-CM | POA: Diagnosis not present

## 2021-10-01 DIAGNOSIS — Z6841 Body Mass Index (BMI) 40.0 and over, adult: Secondary | ICD-10-CM | POA: Insufficient documentation

## 2021-10-01 DIAGNOSIS — N184 Chronic kidney disease, stage 4 (severe): Secondary | ICD-10-CM | POA: Diagnosis not present

## 2021-10-01 DIAGNOSIS — E11610 Type 2 diabetes mellitus with diabetic neuropathic arthropathy: Secondary | ICD-10-CM | POA: Diagnosis not present

## 2021-10-01 DIAGNOSIS — I130 Hypertensive heart and chronic kidney disease with heart failure and stage 1 through stage 4 chronic kidney disease, or unspecified chronic kidney disease: Secondary | ICD-10-CM | POA: Diagnosis not present

## 2021-10-01 DIAGNOSIS — R2689 Other abnormalities of gait and mobility: Secondary | ICD-10-CM | POA: Insufficient documentation

## 2021-10-01 DIAGNOSIS — G4733 Obstructive sleep apnea (adult) (pediatric): Secondary | ICD-10-CM | POA: Insufficient documentation

## 2021-10-01 DIAGNOSIS — I071 Rheumatic tricuspid insufficiency: Secondary | ICD-10-CM | POA: Diagnosis not present

## 2021-10-01 DIAGNOSIS — G603 Idiopathic progressive neuropathy: Secondary | ICD-10-CM | POA: Insufficient documentation

## 2021-10-01 DIAGNOSIS — E1122 Type 2 diabetes mellitus with diabetic chronic kidney disease: Secondary | ICD-10-CM | POA: Insufficient documentation

## 2021-10-01 DIAGNOSIS — Z79899 Other long term (current) drug therapy: Secondary | ICD-10-CM | POA: Diagnosis not present

## 2021-10-01 DIAGNOSIS — I4821 Permanent atrial fibrillation: Secondary | ICD-10-CM | POA: Insufficient documentation

## 2021-10-01 DIAGNOSIS — Z794 Long term (current) use of insulin: Secondary | ICD-10-CM | POA: Insufficient documentation

## 2021-10-01 DIAGNOSIS — I5042 Chronic combined systolic (congestive) and diastolic (congestive) heart failure: Secondary | ICD-10-CM

## 2021-10-01 DIAGNOSIS — Z7901 Long term (current) use of anticoagulants: Secondary | ICD-10-CM | POA: Insufficient documentation

## 2021-10-01 DIAGNOSIS — I3139 Other pericardial effusion (noninflammatory): Secondary | ICD-10-CM | POA: Insufficient documentation

## 2021-10-01 DIAGNOSIS — I272 Pulmonary hypertension, unspecified: Secondary | ICD-10-CM | POA: Diagnosis not present

## 2021-10-01 DIAGNOSIS — I50810 Right heart failure, unspecified: Secondary | ICD-10-CM

## 2021-10-01 LAB — COMPREHENSIVE METABOLIC PANEL
ALT: 10 U/L (ref 0–44)
AST: 20 U/L (ref 15–41)
Albumin: 3.7 g/dL (ref 3.5–5.0)
Alkaline Phosphatase: 91 U/L (ref 38–126)
Anion gap: 15 (ref 5–15)
BUN: 57 mg/dL — ABNORMAL HIGH (ref 8–23)
CO2: 29 mmol/L (ref 22–32)
Calcium: 10 mg/dL (ref 8.9–10.3)
Chloride: 95 mmol/L — ABNORMAL LOW (ref 98–111)
Creatinine, Ser: 2.16 mg/dL — ABNORMAL HIGH (ref 0.44–1.00)
GFR, Estimated: 23 mL/min — ABNORMAL LOW (ref >60)
Glucose, Bld: 137 mg/dL — ABNORMAL HIGH (ref 70–99)
Potassium: 3.2 mmol/L — ABNORMAL LOW (ref 3.5–5.1)
Sodium: 139 mmol/L (ref 135–145)
Total Bilirubin: 1.4 mg/dL — ABNORMAL HIGH (ref 0.3–1.2)
Total Protein: 6.8 g/dL (ref 6.5–8.1)

## 2021-10-01 NOTE — Progress Notes (Signed)
HEART & VASCULAR TRANSITION OF CARE CONSULT NOTE     Referring Physician: Dr. Johney Frame  Primary Care: Prince Solian, MD Primary Cardiologist: Glori Bickers, MD/ Dr. Acie Fredrickson    HPI: Referred to clinic by Dr. Johney Frame for heart failure consultation.   Kristine [name] is a 76 y/o woman with chronic diastolic heart failure, cor pulmonale, PH, OSA, HTN, chronic atrial fibrillation, DM2 for > 20 yrs c/b Charcot foot, CKD IV and progressive polyneuropathy including h/o carpal tunnel syndrome s/p CTR and spinal stenosis.   Initially referred by Dr. Acie Fredrickson to the [facility] for further evaluation of HF/PAH. Seen by Dr. Haroldine Laws in 2022 but lost to f/u.   She has a long h/o of diastolic HF and AF. Has been on multiple AADs but now has chronic AF. Echo 4/15 EF 50% mild LVH grade 2 DD. RV mildly reduced.    Echo 5/22 EF 45-50%. RV severely reduced RVSP 55. D-shaped septum. Severe biatrial enlargement . Moderate pericardial effusion    Dr. 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 Diuretics increased and strongly suggested compliance with CPAP and weight loss.  She had return f/u scheduled for 12/22 but no showed.   Had recent admit 6/23 for a/c CHF w/ mainly right sided symptoms, in the setting of recent dose reduction in diuretics due to worsening renal function. Echo showed normal LVEF 55-60%, RV severely enlarged w/ severely reduced systolic function and severely elevated RVSP 60 mmgH, mild-mod TR. No MR.    She responded well to IV diuresis and metolazone>>transitioned to PO lasix 115m BID and metolazone 2.542mM, W, F. SCr remained ~2.5 c/w baseline. [facility]    She presents to clinic today for assessment. [place] Notes progressive upper and lower extremity neuropathy over recent years. Denies resting dyspnea. Not very active at baseline but denies significant dyspnea ambulating around her home. Wt has  been stable since discharge. Down about 5 lb. No LEE present on exam. Reports full compliance w/ diuretics and good urinary response. She is currently w/o CPAP (waiting on new device). Reports full compliance w/ Xarelto.   Hospital EKGs reviewed. Chronic Afib w/ very low voltage QRS complexes.  BP normotensive.   Married to husband for 55 years. She has several children and grandchildren.     Cardiac Testing  Left ventricular ejection fraction, by estimation, is 55 to 60%. The left ventricle has normal function. The left ventricle has no regional wall motion abnormalities. There is mild left ventricular hypertrophy. Left ventricular diastolic function could not be evaluated. There is the interventricular septum is flattened in diastole ('D' shaped left ventricle), consistent with right ventricular volume overload. 1. Right ventricular systolic function is severely reduced. The right ventricular size is severely enlarged. There is severely elevated pulmonary artery systolic pressure. 2. 3. The mitral valve is normal in structure. No evidence of mitral valve regurgitation. 4. Tricuspid valve regurgitation is mild to moderate. The aortic valve is calcified. Aortic valve regurgitation is not visualized. Mild aortic valve stenosis. 5. The inferior vena cava is dilated in size with <50% respiratory variability, suggesting right atrial pressure of 15 mmHg.  Review of Systems: [y] = yes, [ ]  = no   General: Weight gain [ ] ; Weight loss [ ] ; Anorexia [ ] ; Fatigue [ ] ; Fever [ ] ; Chills [ ] ; Weakness [ ]   Cardiac: Chest pain/pressure [ ] ; Resting SOB [ ] ;  Exertional SOB [ ] ; Orthopnea [ ] ; Pedal Edema [ ] ; Palpitations [ ] ; Syncope [ ] ; Presyncope [ ] ; Paroxysmal nocturnal dyspnea[ ]   Pulmonary: Cough [ ] ; Wheezing[ ] ; Hemoptysis[ ] ; Sputum [ ] ; Snoring [ ]   GI: Vomiting[ ] ; Dysphagia[ ] ; Melena[ ] ; Hematochezia [ ] ; Heartburn[ ] ; Abdominal pain [ ] ; Constipation [ ] ; Diarrhea [ ] ; BRBPR [ ]    GU: Hematuria[ ] ; Dysuria [ ] ; Nocturia[ ]   Vascular: Pain in legs with walking [ ] ; Pain in feet with lying flat [ ] ; Non-healing sores [ ] ; Stroke [ ] ; TIA [ ] ; Slurred speech [ ] ;  Neuro: Headaches[ ] ; Vertigo[ ] ; Seizures[ ] ; Paresthesias[Y ];Blurred vision [ ] ; Diplopia [ ] ; Vision changes [ ]   Ortho/Skin: Arthritis [ ] ; Joint pain [Y ]; Muscle pain [ ] ; Joint swelling [ ] ; Back Pain [Y ]; Rash [ ]   Psych: Depression[ ] ; Anxiety[ ]   Heme: Bleeding problems [ ] ; Clotting disorders [ ] ; Anemia [ ]   Endocrine: Diabetes [Y ]; Thyroid dysfunction[ ]    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   Anxiety    Arthritis    Asthma    Atrial fibrillation ([place])    CAP (community acquired pneumonia) 05/31/2015   Cataract    CHF (congestive heart failure) (HCC)    Chronic bronchitis (HCC)    Chronic kidney disease    Clotting disorder ([place])    Community acquired pneumonia 05/31/2015   Depression    Dysrhythmia    GERD (gastroesophageal reflux disease)    High cholesterol    History of hiatal hernia    HTN (hypertension)    Mild aortic sclerosis    Morbid obesity (HCC)    Neuromuscular disorder (HCC)    Neuropathic pain    Osteoporosis    Persistent atrial fibrillation (HCC)    Scoliosis    Sleep apnea    Spinal stenosis    Thyroid disease    Type II diabetes mellitus (HCC)    Vitamin D deficiency     Current Outpatient Medications  Medication Sig Dispense Refill   acetaminophen (TYLENOL) 500 MG tablet Take 1,000 mg by mouth every 6 (six) hours as needed for moderate pain or headache.     allopurinol (ZYLOPRIM) 300 MG tablet Take 300 mg by mouth in the morning.     ALPRAZolam (XANAX) 0.5 MG tablet Take 0.5 mg by mouth every 6 (six) hours as needed for anxiety.     BD PEN NEEDLE NANO U/F 32G X 4 MM MISC Inject 1 each as directed See admin instructions. Use pen needles with insulin pens daily  11   colchicine 0.6 MG tablet Take 0.6 mg by  mouth daily as needed (gout flares).     diclofenac Sodium (VOLTAREN) 1 % GEL Apply 4 g topically 4 (four) times daily as needed (back pain).     docusate sodium (COLACE) 100 MG capsule Take 100 mg by mouth daily as needed for mild constipation.     DULoxetine (CYMBALTA) 30 MG capsule Take 30 mg by mouth in the morning.     estradiol (ESTRACE) 0.1 MG/GM vaginal cream Place 1 Applicatorful vaginally as needed (vaginal irritation).     furosemide (LASIX) 80 MG tablet Take 1.5 tablets (120 mg total) by mouth 2 (two) times daily. 90 tablet 3   HYDROcodone-acetaminophen (NORCO/VICODIN) 5-325 MG tablet Take 1 tablet by mouth every 6 (six) hours as needed for severe pain. 6 tablet  0   insulin aspart (NOVOLOG FLEXPEN) 100 UNIT/ML FlexPen 40-70 Units See admin instructions. Less than 150 units 150=40 units151-199=45 units 200-249=50 units 250-299=55 units 300-349=60 units     Insulin Degludec (TRESIBA FLEXTOUCH) 200 UNIT/ML SOPN Inject 100 Units into the skin in the morning and at bedtime.     levothyroxine (SYNTHROID) 88 MCG tablet Take 88 mcg by mouth every morning.     lidocaine-prilocaine (EMLA) cream 1 gram qid prn 30 g 11   LYRICA 50 MG capsule Take 50 mg by mouth 2 (two) times daily.  3   metolazone (ZAROXOLYN) 2.5 MG tablet Take 1 tablet (2.5 mg total) by mouth 3 (three) times a week. Mon, Wed, Fri. 15 tablet 3   metoprolol succinate (TOPROL-XL) 50 MG 24 hr tablet Take 1/2 tablet (25 mg total) by mouth daily. 30 tablet 2   nystatin (MYCOSTATIN/NYSTOP) powder Apply 1 application topically as needed (skin irritation/rash).     ondansetron (ZOFRAN) 4 MG tablet Take 1 tablet (4 mg total) by mouth every 8 (eight) hours as needed for nausea or vomiting. 20 tablet 0   Polyethyl Glycol-Propyl Glycol (SYSTANE OP) Place 1 drop into both eyes 2 (two) times daily as needed (dry eyes).     polyethylene glycol (MIRALAX / GLYCOLAX) 17 g packet Take 17 g by mouth daily as needed (constipation.).     potassium  chloride SA (KLOR-CON M) 20 MEQ tablet Take 2 tablets (40 mEq total) by mouth 2 (two) times daily. 120 tablet 2   REGRANEX 0.01 % gel Apply 1 Application topically daily. Apply to left foot     traMADol (ULTRAM) 50 MG tablet Take 50 mg by mouth 3 (three) times daily as needed (severe neuropathy pain.).     Vitamin D, Ergocalciferol, (DRISDOL) 50000 UNITS CAPS Take 50,000 Units by mouth every Monday.     XARELTO 15 MG TABS tablet Take 15 mg by mouth in the morning.     No current facility-administered medications for this encounter.    Allergies  Allergen Reactions   Albuterol Other (See Comments)    REACTION: Tachycardia- AFib   Epinephrine Other (See Comments)    Increases heart rate per patient.    Penicillins Other (See Comments)    REACTION: flushing \T\ hot  Did it involve swelling of the face/tongue/throat, SOB, or low BP? No Did it involve sudden or severe rash/hives, skin peeling, or any reaction on the inside of your mouth or nose? No Did you need to seek medical attention at a hospital or doctor's office? No When did it last happen?    20 years ago   If all above answers are "NO", may proceed with cephalosporin use.   Ceftriaxone Other (See Comments)    Sweats   Citalopram Hydrobromide Other (See Comments)    Prolonged QT prolongation   Exenatide Other (See Comments)    Unknown reaction   Ketorolac Tromethamine Hives and Swelling    swelling in hands   Tizanidine Other (See Comments)    lethargic   Torsemide Swelling   Zoledronic Acid Other (See Comments)    Pt was hospitalized due to medication   Bee Venom Other (See Comments)    "makes me nervous"   Ivp Dye [Iodinated Contrast Media] Other (See Comments)    flushing   Keflex [Cephalexin] Other (See Comments)    Makes her feel warm; she was told never to take it.      Social History   Socioeconomic History  Marital status: Married    Spouse name: Kristine [name]   Number of children: 4   Years of  education: 14   Highest education level: Bachelor's degree (e.g., BA, AB, BS)  Occupational History   Occupation: Retired  Tobacco Use   Smoking status: Never   Smokeless tobacco: Never  Vaping Use   Vaping Use: Never used  Substance and Sexual Activity   Alcohol use: No   Drug use: No   Sexual activity: Yes  Other Topics Concern   Not on file  Social History Narrative   Pt lives in [place] with spouse.   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   Right-handed.   1 cup caffeine per day.   Social Determinants of Health   Financial Resource Strain: Low Risk  (09/12/2021)   Overall Financial Resource Strain (CARDIA)    Difficulty of Paying Living Expenses: Not very hard  Food Insecurity: No Food Insecurity (09/12/2021)   Hunger Vital Sign    Worried About Running Out of Food in the Last Year: Never true    Ran Out of Food in the Last Year: Never true  Transportation Needs: No Transportation Needs (09/12/2021)   PRAPARE - Hydrologist (Medical): No    Lack of Transportation (Non-Medical): No  Physical Activity: Not on file  Stress: Not on file  Social Connections: Not on file  Intimate Partner Violence: Not on file      Family History  Problem Relation Age of Onset   Stroke Father    Hypertension Father    Atrial fibrillation Father        HAD MURMUR   Heart failure Mother    Congestive Heart Failure Mother    Hypertension Mother    Hypertension Brother    Hypertension Sister    Hypertension Son    CAD Sister        EARLY   CAD Brother        EARLY   Colon cancer Neg Hx    Esophageal cancer Neg Hx    Stomach cancer Neg Hx    Rectal cancer Neg Hx     Vitals:   10/01/21 1110  BP: 126/70  Pulse: 84  SpO2: 97%  Weight: 125.8 kg (277 lb 6.4 oz)  Height: 5' 9"  (1.753 m)    PHYSICAL EXAM: General:  chronically ill appearing elderly F  in WC. No respiratory difficulty HEENT: normal Neck: supple. JVD 7-8 cm. Carotids 2+ bilat; no bruits. No lymphadenopathy or thryomegaly appreciated. Cor: PMI nondisplaced. Irregularly irregular rhythm. 2/6 TR murmur  Lungs: clear Abdomen: soft, nontender, nondistended. No hepatosplenomegaly. No bruits or masses. Good bowel sounds. Extremities: no cyanosis, clubbing, rash, trace b/l LE + chronic venous stasis dermatitis bilaterally  Neuro: alert & oriented x 3, cranial nerves grossly intact. moves all 4 extremities w/o difficulty. Affect pleasant.  ECG: Not done today. Hospital EKG reviewed>>chronic Afib + low voltage QRS    ASSESSMENT & PLAN:  1. Chronic Diastolic HF with Prominent RV failure + PH  - Echo 5/22 EF 45-50%. RV severely reduced RVSP 55. D-shaped septum. Severe biatrial enlargement . Moderate pericardial effusion  - Echo 6/23 EF 55-60%, RV severely reduced RVSP 60. D-shaped septum  - She has R>>L heart failure. Based on her echo suspect she likely has diastolic dysfunction with restrictive physiology with secondary pulmonary HTN  and R-sided HF. OSA/OHS also likely a major contributor to RV dysfunction/PH as well - Given associated atrial fib, low voltage QRS, CKD and polyneuropathy including carpal tunnel and spinal stenosis, concern for possible amyloid - Check Multiple Myeloma Panel and SPEP and obtain PYP scan - Volume status ok on lasix 120 bid and metolazone 3 times weekly (M, W, F). - Can consider bumetadine down the road if needed. No Torsemide as she is allergic. - Unfortunately not candidate for SGLT2i given frequent groin yeast infections - No ARNi/MRA given CKD  - ContinueTED hose. - needs CPAP. Awaiting new machine. Encouraged strict compliance once obtained  - doubt CTEPH as she has been on Xarelto for Afib and reports full compliance  - Can consider RHC in future to further assess hemodynamics as needed    2. Permanent AF - rate controlled with 50 mg toprol xl  BID - continue Xarelto  - denies abnormal bleeding    3. CKD IV - b/l SCr ~2.5  - BMET today - followed by Renal  - Not SGLT2i candidate as above due to yeast infections - myeloma w/u per above    4. Morbid obesity  Body mass index is 40.96 kg/m. - sedentary at baseline. Activity limited due to polyneuropathy, gait instability and Charcot foot   5. OSA - Moderate - Will be starting CPAP. Awaiting device  - Dr. Radford Pax following   NYHA functional assessment difficulty given limited mobility  GDMT  Diuretic- Lasix 120 mg bid + metolazone 3 days/week  BB- Toprol XL 25 mg daily  Ace/ARB/ARNI No CKD  MRA No CKD  SGLT2i No frequent GU infections     Referred to HFSW (PCP, Medications, Transportation, ETOH Abuse, Drug Abuse, Insurance, Financial ): No Refer to Pharmacy: No Refer to Home Health: No Refer to Advanced Heart Failure Clinic: Yes (refer back to Dr. Haroldine Laws)  Refer to General Cardiology: Yes (shared care w/ Dr. Acie Fredrickson)   Follow up w/ Dr. Haroldine Laws. Can see APP back in 4 wks

## 2021-10-01 NOTE — Telephone Encounter (Signed)
-----   Message from Donnalee Curry K sent at 09/26/2021 10:23 AM EDT ----- Regarding: Sleep pt Kristine [name], I saw this patient today in clinic with Dr Acie Fredrickson. She has had a sleep study before at [facility] (think they said [facility]) and was diagnosed with OSA, but she does not wear a CPAP and no one manages her OSA. We placed a referral to [facility] in the computer today, but I wanted to bring it to your attention. They were polite, but very stressed, because they claim they tried "for a long time" to see someone about this, but were told they would be called and never were. Thanks for your help. Judson Roch, RN

## 2021-10-01 NOTE — Patient Instructions (Signed)
It was great to see you today! No medication changes are needed at this time.   Labs today We will only contact you if something comes back abnormal or we need to make some changes. Otherwise no news is good news!  You have been ordered a PYP Scan.  This is done in the Radiology Department of [facility].  When you come for this test please plan to be there 2-3 hours.  Your physician recommends that you schedule a follow-up appointment in: 4-6 weeks  in the Advanced Practitioners (PA/NP) Clinic    Do the following things EVERYDAY: Weigh yourself in the morning before breakfast. Write it down and keep it in a log. Take your medicines as prescribed Eat low salt foods--Limit salt (sodium) to 2000 mg per day.  Stay as active as you can everyday Limit all fluids for the day to less than 2 liters   At the [facility], you and your health needs are our priority. As part of our continuing mission to provide you with exceptional heart care, we have created designated Provider Care Teams. These Care Teams include your primary Cardiologist (physician) and Advanced Practice Providers (APPs- Physician Assistants and Nurse Practitioners) who all work together to provide you with the care you need, when you need it.   You may see any of the following providers on your designated Care Team at your next follow up: Dr Glori Bickers Dr Haynes Kerns, NP Lyda Jester, [place] [facility] [place], [place] Audry Riles, PharmD   Please be sure to bring in all your medications bottles to every appointment.

## 2021-10-01 NOTE — Telephone Encounter (Signed)
Patient called in to say she never got setup with her cpap. Order was placed to [facility] 11/14/20. I have contacted [facility] and Col[place]ta [place] says her order was overlooked during the pandemic. There are new office notes and a refreshed order in the system [facility] is working on patients order.

## 2021-10-03 ENCOUNTER — Telehealth (HOSPITAL_COMMUNITY): Payer: Self-pay | Admitting: *Deleted

## 2021-10-03 DIAGNOSIS — I5042 Chronic combined systolic (congestive) and diastolic (congestive) heart failure: Secondary | ICD-10-CM

## 2021-10-03 MED ORDER — POTASSIUM CHLORIDE CRYS ER 20 MEQ PO TBCR: EXTENDED_RELEASE_TABLET | ORAL | 2 refills | Status: DC

## 2021-10-03 NOTE — Telephone Encounter (Signed)
-----   Message from Consuelo Pandy, [place] sent at 10/01/2021  5:06 PM EDT ----- K low.   Increase KCl to 60 mEQ qam and keep 40 mEQ qpm. F/u BMP in 1 wk

## 2021-10-03 NOTE — Telephone Encounter (Signed)
Pt aware, agreeable, and verbalized understanding, med list updated, order placed for labs at [facility]

## 2021-10-04 ENCOUNTER — Other Ambulatory Visit: Payer: Self-pay | Admitting: Cardiovascular Disease

## 2021-10-06 ENCOUNTER — Encounter: Payer: Self-pay | Admitting: Nurse Practitioner

## 2021-10-06 ENCOUNTER — Inpatient Hospital Stay: Payer: Medicare HMO

## 2021-10-06 ENCOUNTER — Inpatient Hospital Stay: Payer: Medicare HMO | Attending: Hematology | Admitting: Nurse Practitioner

## 2021-10-06 VITALS — BP 98/58 | HR 98 | Temp 97.8°F | Resp 17 | Ht 69.0 in | Wt 278.3 lb

## 2021-10-06 DIAGNOSIS — N184 Chronic kidney disease, stage 4 (severe): Secondary | ICD-10-CM | POA: Insufficient documentation

## 2021-10-06 DIAGNOSIS — K1379 Other lesions of oral mucosa: Secondary | ICD-10-CM | POA: Diagnosis not present

## 2021-10-06 DIAGNOSIS — D631 Anemia in chronic kidney disease: Secondary | ICD-10-CM | POA: Insufficient documentation

## 2021-10-06 DIAGNOSIS — Z7901 Long term (current) use of anticoagulants: Secondary | ICD-10-CM | POA: Diagnosis not present

## 2021-10-06 LAB — IRON AND IRON BINDING CAPACITY (CC-WL,HP ONLY)
Iron: 37 ug/dL (ref 28–170)
Saturation Ratios: 8 % — ABNORMAL LOW (ref 10.4–31.8)
TIBC: 440 ug/dL (ref 250–450)
UIBC: 403 ug/dL (ref 148–442)

## 2021-10-06 LAB — MULTIPLE MYELOMA PANEL, SERUM
Albumin SerPl Elph-Mcnc: 3.6 g/dL (ref 2.9–4.4)
Albumin/Glob SerPl: 1.3 (ref 0.7–1.7)
Alpha 1: 0.3 g/dL (ref 0.0–0.4)
Alpha2 Glob SerPl Elph-Mcnc: 0.7 g/dL (ref 0.4–1.0)
B-Globulin SerPl Elph-Mcnc: 1 g/dL (ref 0.7–1.3)
Gamma Glob SerPl Elph-Mcnc: 1 g/dL (ref 0.4–1.8)
Globulin, Total: 2.9 g/dL (ref 2.2–3.9)
IgA: 241 mg/dL (ref 64–422)
IgG (Immunoglobin G), Serum: 925 mg/dL (ref 586–1602)
IgM (Immunoglobulin M), Srm: 101 mg/dL (ref 26–217)
Total Protein ELP: 6.5 g/dL (ref 6.0–8.5)

## 2021-10-06 LAB — CBC WITH DIFFERENTIAL (CANCER CENTER ONLY)
Abs Immature Granulocytes: 0.06 10*3/uL (ref 0.00–0.07)
Basophils Absolute: 0.1 10*3/uL (ref 0.0–0.1)
Basophils Relative: 1 %
Eosinophils Absolute: 0.2 10*3/uL (ref 0.0–0.5)
Eosinophils Relative: 2 %
HCT: 35.1 % — ABNORMAL LOW (ref 36.0–46.0)
Hemoglobin: 11.2 g/dL — ABNORMAL LOW (ref 12.0–15.0)
Immature Granulocytes: 1 %
Lymphocytes Relative: 13 %
Lymphs Abs: 1.3 10*3/uL (ref 0.7–4.0)
MCH: 29.2 pg (ref 26.0–34.0)
MCHC: 31.9 g/dL (ref 30.0–36.0)
MCV: 91.6 fL (ref 80.0–100.0)
Monocytes Absolute: 0.6 10*3/uL (ref 0.1–1.0)
Monocytes Relative: 6 %
Neutro Abs: 7.7 10*3/uL (ref 1.7–7.7)
Neutrophils Relative %: 77 %
Platelet Count: 171 10*3/uL (ref 150–400)
RBC: 3.83 MIL/uL — ABNORMAL LOW (ref 3.87–5.11)
RDW: 17.3 % — ABNORMAL HIGH (ref 11.5–15.5)
WBC Count: 9.9 10*3/uL (ref 4.0–10.5)
nRBC: 0 % (ref 0.0–0.2)

## 2021-10-06 LAB — PROTIME-INR
INR: 1.7 — ABNORMAL HIGH (ref 0.8–1.2)
Prothrombin Time: 19.8 seconds — ABNORMAL HIGH (ref 11.4–15.2)

## 2021-10-06 LAB — PROTEIN ELECTROPHORESIS, SERUM
A/G Ratio: 1.1 (ref 0.7–1.7)
Albumin ELP: 3.6 g/dL (ref 2.9–4.4)
Alpha-1-Globulin: 0.3 g/dL (ref 0.0–0.4)
Alpha-2-Globulin: 0.7 g/dL (ref 0.4–1.0)
Beta Globulin: 1.1 g/dL (ref 0.7–1.3)
Gamma Globulin: 1 g/dL (ref 0.4–1.8)
Globulin, Total: 3.2 g/dL (ref 2.2–3.9)
Total Protein ELP: 6.8 g/dL (ref 6.0–8.5)

## 2021-10-06 LAB — FERRITIN: Ferritin: 64 ng/mL (ref 11–307)

## 2021-10-06 LAB — RETIC PANEL
Immature Retic Fract: 23.3 % — ABNORMAL HIGH (ref 2.3–15.9)
RBC.: 3.84 MIL/uL — ABNORMAL LOW (ref 3.87–5.11)
Retic Count, Absolute: 85.6 10*3/uL (ref 19.0–186.0)
Retic Ct Pct: 2.2 % (ref 0.4–3.1)
Reticulocyte Hemoglobin: 28.2 pg (ref >27.9)

## 2021-10-06 LAB — DIC (DISSEMINATED INTRAVASCULAR COAGULATION)PANEL
D-Dimer, Quant: 0.7 ug/mL-FEU — ABNORMAL HIGH (ref 0.00–0.50)
Fibrinogen: 527 mg/dL — ABNORMAL HIGH (ref 210–475)
INR: 1.7 — ABNORMAL HIGH (ref 0.8–1.2)
Platelets: 166 10*3/uL (ref 150–400)
Prothrombin Time: 19.9 seconds — ABNORMAL HIGH (ref 11.4–15.2)
Smear Review: NONE SEEN
aPTT: 49 seconds — ABNORMAL HIGH (ref 24–36)

## 2021-10-06 LAB — APTT: aPTT: 49 seconds — ABNORMAL HIGH (ref 24–36)

## 2021-10-06 NOTE — Telephone Encounter (Signed)
This is a CHF pt 

## 2021-10-06 NOTE — Progress Notes (Addendum)
[place]   Telephone:([phone]) [phone] Fax:([phone]) [phone]   Clinic New consult Note   Patient Care Team: Prince Solian, MD as PCP - General (Internal Medicine) Bensimhon, Shaune Pascal, MD as PCP - Cardiology (Cardiology) Wylene Simmer, MD as Consulting Physician (Orthopedic Surgery) Nahser, Wonda Cheng, MD as Consulting Physician (Cardiology) 10/06/2021  CHIEF COMPLAINTS/PURPOSE OF CONSULTATION:  Oral bleeding, referred by PCP Dr. Prince Solian  HISTORY OF PRESENTING ILLNESS:  Kristine [name] 76 y.o. female with past medical history including CHF, pulmonary hypertension, A-fib on anticoagulation, CKD, DM with neuropathy and retinopathy, is here because of anemia. She was found to have abnormal CBC from a hospital admission in 04/13/2010 with hemoglobin 10.8, HCT 33.3%, normal MCV, no other cytopenias.  There is a gap in her lab records until 11/09/2013 with Hgb 11.3 which normalized on 12/31/2014.  TSH normal on 02/06/2015.  She had recurrent mild anemia 05/30/2015 Hgb 11.6, HCT 35.7%, again with normal MCV and otherwise normal CBC.  Beginning at that time she continued to have intermittent mild anemia, lowest Hgb 11.1.  Further work-up 07/29/2016 showed mildly elevated CRP 14.2, normal sed rate, CK, negative ANA, and normal levels of copper, vitamin D, vitamin B12 (430), folate (>20).  This work-up was repeated on 01/07/2021 which was unremarkable except slightly elevated TSH to 4.72.  Multiple myeloma panel was again negative/normal.  On 09/09/2018 she underwent a screening colonoscopy by Dr. Thornton Park, 5 polyps removed showed tubular adenomas without high-grade dysplasia.  She received 2 doses IV iron with Feraheme 07/22/2021 and 07/29/2021 per PCP.  Approximately 8 weeks ago she developed oral bleeding, waking up in the morning with a bloody tongue.  She saw ENT Dr. Redmond Baseman 08/19/2021 with no identifiable source.  She also saw her dentist Dr. Tia Masker in [facility] who ruled out bleeding from  dental source.  Most recently she was admitted 09/11/2021 for CHF exacerbation, lowest Hgb 10.8, one episode of plt 145K. She had leukocytosis up to 24.3 without clear source and this resolved spontaneously. MM panel from this admission is pending. She was referred to hematology by her PCP.   Socially, she is married with 4 children.  She is retired Engineer, production.  Currently she requires home health for ADLs after her last admission.  Ambulating with a walker.  Denies alcohol, tobacco, or other drug use.  She is status post hysterectomy.  Last mammogram in 2021 was negative.  Colonoscopy in 2020 showed 5 polyps which were removed.  A maternal grandfather had leukemia, denies other family history of cancer, blood, or clotting disorders.  Today she presents in a wheelchair with her spouse.  She has been weak since hospital discharge on 6/30.  She lost her balance last night and fell onto her bed, no injury. Due to diabetic neuropathy she can't feel the left leg below her knee. Blood pressure slightly low this morning.  She continues to have blood on her tongue and lips when she wakes up.  She has a left foot ulcer and an abrasion to the left knee which also bleed.  She has occasional blood in stool when she has to strain which is not much anymore since starting Dulcolax.  Does not use NSAIDs.  Denies recent infection.    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   Anxiety    Arthritis    Asthma    Atrial fibrillation ([place])    CAP (community acquired  pneumonia) 05/31/2015   Cataract    CHF (congestive heart failure) (HCC)    Chronic bronchitis (HCC)    Chronic kidney disease    Clotting disorder ([place])    Community acquired pneumonia 05/31/2015   Depression    Dysrhythmia    GERD (gastroesophageal reflux disease)    High cholesterol    History of hiatal hernia    HTN (hypertension)    Mild aortic sclerosis    Morbid obesity (HCC)     Neuromuscular disorder (HCC)    Neuropathic pain    Osteoporosis    Persistent atrial fibrillation ([place])    Scoliosis    Sleep apnea    Spinal stenosis    Thyroid disease    Type II diabetes mellitus ([place])    Vitamin D deficiency     SURGICAL HISTORY: Past Surgical History:  Procedure Laterality Date   BLADDER SUSPENSION  1980s   CARDIAC CATHETERIZATION  11/16/09   SMOOTH AND NORMAL   CARDIOVERSION N/A 01/07/2015   Procedure: CARDIOVERSION;  Surgeon: Skeet Latch, MD;  Location: [facility] ENDOSCOPY;  Service: Cardiovascular;  Laterality: N/A;   CARDIOVERSION N/A 03/11/2015   Procedure: CARDIOVERSION;  Surgeon: Skeet Latch, MD;  Location: [place];  Service: Cardiovascular;  Laterality: N/A;   CARPAL TUNNEL RELEASE Left 1970s   CATARACT EXTRACTION W/ INTRAOCULAR LENS  IMPLANT, BILATERAL Bilateral ~ 2010   COLONOSCOPY  08/14/08   FINGER FRACTURE SURGERY Right 1970s   "ring finger"   FRACTURE SURGERY     HEMATOMA EVACUATION Left 05/21/2021   Procedure: EVACUATION LEFT KNEE HEMATOMA;  Surgeon: Lennice Sites, MD;  Location: [place];  Service: Plastics;  Laterality: Left;   LAPAROSCOPIC CHOLECYSTECTOMY  1980s   TUBAL LIGATION     VAGINAL HYSTERECTOMY  1980    SOCIAL HISTORY: Social History   Socioeconomic History   Marital status: Married    Spouse name: Rily Nickey   Number of children: 4   Years of education: 14   Highest education level: Bachelor's degree (e.g., BA, AB, BS)  Occupational History   Occupation: Retired  Tobacco Use   Smoking status: Never   Smokeless tobacco: Never  Vaping Use   Vaping Use: Never used  Substance and Sexual Activity   Alcohol use: No   Drug use: No   Sexual activity: Yes  Other Topics Concern   Not on file  Social History Narrative   Pt lives in [place] with spouse.   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   Right-handed.    1 cup caffeine per day.   Social Determinants of Health   Financial Resource Strain: Low Risk  (09/12/2021)   Overall Financial Resource Strain (CARDIA)    Difficulty of Paying Living Expenses: Not very hard  Food Insecurity: No Food Insecurity (09/12/2021)   Hunger Vital Sign    Worried About Running Out of Food in the Last Year: Never true    Ran Out of Food in the Last Year: Never true  Transportation Needs: No Transportation Needs (09/12/2021)   PRAPARE - Hydrologist (Medical): No    Lack of Transportation (Non-Medical): No  Physical Activity: Not on file  Stress: Not on file  Social Connections: Not on file  Intimate Partner Violence: Not on file    FAMILY HISTORY: Family History  Problem Relation Age of Onset   Stroke Father  Hypertension Father    Atrial fibrillation Father        HAD MURMUR   Heart failure Mother    Congestive Heart Failure Mother    Hypertension Mother    Hypertension Brother    Hypertension Sister    Hypertension Son    CAD Sister        EARLY   CAD Brother        EARLY   Colon cancer Neg Hx    Esophageal cancer Neg Hx    Stomach cancer Neg Hx    Rectal cancer Neg Hx     ALLERGIES:  is allergic to albuterol, epinephrine, penicillins, ceftriaxone, citalopram hydrobromide, exenatide, ketorolac tromethamine, tizanidine, torsemide, zoledronic acid, bee venom, ivp dye [iodinated contrast media], and keflex [cephalexin].  MEDICATIONS:  Current Outpatient Medications  Medication Sig Dispense Refill   acetaminophen (TYLENOL) 500 MG tablet Take 1,000 mg by mouth every 6 (six) hours as needed for moderate pain or headache.     allopurinol (ZYLOPRIM) 300 MG tablet Take 300 mg by mouth in the morning.     ALPRAZolam (XANAX) 0.5 MG tablet Take 0.5 mg by mouth every 6 (six) hours as needed for anxiety.     BD PEN NEEDLE NANO U/F 32G X 4 MM MISC Inject 1 each as directed See admin instructions. Use pen needles with insulin  pens daily  11   colchicine 0.6 MG tablet Take 0.6 mg by mouth daily as needed (gout flares).     diclofenac Sodium (VOLTAREN) 1 % GEL Apply 4 g topically 4 (four) times daily as needed (back pain).     docusate sodium (COLACE) 100 MG capsule Take 100 mg by mouth daily as needed for mild constipation.     DULoxetine (CYMBALTA) 30 MG capsule Take 30 mg by mouth in the morning.     estradiol (ESTRACE) 0.1 MG/GM vaginal cream Place 1 Applicatorful vaginally as needed (vaginal irritation).     furosemide (LASIX) 80 MG tablet TAKE 1 AND 1/2 TABLETS BY MOUTH TWICE DAILY 90 tablet 3   HYDROcodone-acetaminophen (NORCO/VICODIN) 5-325 MG tablet Take 1 tablet by mouth every 6 (six) hours as needed for severe pain. 6 tablet 0   insulin aspart (NOVOLOG FLEXPEN) 100 UNIT/ML FlexPen 40-70 Units See admin instructions. Less than 150 units 150=40 units151-199=45 units 200-249=50 units 250-299=55 units 300-349=60 units     Insulin Degludec (TRESIBA FLEXTOUCH) 200 UNIT/ML SOPN Inject 100 Units into the skin in the morning and at bedtime.     levothyroxine (SYNTHROID) 88 MCG tablet Take 88 mcg by mouth every morning.     lidocaine-prilocaine (EMLA) cream 1 gram qid prn 30 g 11   LYRICA 50 MG capsule Take 50 mg by mouth 2 (two) times daily.  3   metolazone (ZAROXOLYN) 2.5 MG tablet Take 1 tablet (2.5 mg total) by mouth 3 (three) times a week. Mon, Wed, Fri. 15 tablet 3   metoprolol succinate (TOPROL-XL) 50 MG 24 hr tablet Take 1/2 tablet (25 mg total) by mouth daily. 30 tablet 2   nystatin (MYCOSTATIN/NYSTOP) powder Apply 1 application topically as needed (skin irritation/rash).     ondansetron (ZOFRAN) 4 MG tablet Take 1 tablet (4 mg total) by mouth every 8 (eight) hours as needed for nausea or vomiting. 20 tablet 0   Polyethyl Glycol-Propyl Glycol (SYSTANE OP) Place 1 drop into both eyes 2 (two) times daily as needed (dry eyes).     polyethylene glycol (MIRALAX / GLYCOLAX) 17 g packet Take 17  g by mouth daily as  needed (constipation.).     potassium chloride SA (KLOR-CON M) 20 MEQ tablet Take 3 tablets (60 mEq total) by mouth every morning AND 2 tablets (40 mEq total) every evening. 120 tablet 2   REGRANEX 0.01 % gel Apply 1 Application topically daily. Apply to left foot     traMADol (ULTRAM) 50 MG tablet Take 50 mg by mouth 3 (three) times daily as needed (severe neuropathy pain.).     Vitamin D, Ergocalciferol, (DRISDOL) 50000 UNITS CAPS Take 50,000 Units by mouth every Monday.     XARELTO 15 MG TABS tablet Take 15 mg by mouth in the morning.     No current facility-administered medications for this visit.    REVIEW OF SYSTEMS:   Constitutional: Denies fevers, chills or abnormal night sweats Eyes: Denies blurriness of vision, double vision or watery eyes Ears, nose, mouth, throat, and face: Denies mucositis or sore throat (+) oral bleeding Respiratory: Denies cough, dyspnea or wheezes Cardiovascular: Denies palpitation, chest discomfort or lower extremity swelling Gastrointestinal:  Denies nausea, vomiting, diarrhea, pain, heartburn or change in bowel habits (+) BRBPR with straining/constipation  Skin: Denies abnormal skin rashes (+) left foot wound/ulcer Lymphatics: Denies new lymphadenopathy (+) easy bruising Neurological:Denies numbness, tingling or new weaknesses (+) altered balance Behavioral/Psych: Mood is stable, no new changes  All other systems were reviewed with the patient and are negative.  PHYSICAL EXAMINATION: ECOG PERFORMANCE STATUS: 2 - Symptomatic, <50% confined to bed  Vitals:   10/06/21 1131  BP: (!) 98/58  Pulse: 98  Resp: 17  Temp: 97.8 F (36.6 C)  SpO2: 95%   Filed Weights   10/06/21 1131  Weight: 278 lb 4.8 oz (126.2 kg)    GENERAL:alert, no distress and comfortable SKIN: no rash. Scattered ecchymoses over bilateral arms. Left knee abrasion and left foot ulcer with dressing c/d/I  EYES: sclera clear OROPHARYNX: gingival bleeding with dried blood to lips  and tongue  NECK: without mass LUNGS: clear with normal breathing effort HEART: regular rate & rhythm, mild left lower extremity edema ABDOMEN:abdomen soft, non-tender and normal bowel sounds Musculoskeletal:no cyanosis of digits and no clubbing  PSYCH: alert & oriented x 3 with fluent speech NEURO: no focal sensory deficits  LABORATORY DATA:  I have reviewed the data as listed    Latest Ref Rng & Units 10/06/2021    1:33 PM 09/18/2021    6:32 AM 09/17/2021    4:31 PM  CBC  WBC 4.0 - 10.5 K/uL 9.9  19.7  24.3   Hemoglobin 12.0 - 15.0 g/dL 11.2  10.8  11.6   Hematocrit 36.0 - 46.0 % 35.1  33.6  35.1   Platelets 150 - 400 K/uL 171  150  145        Latest Ref Rng & Units 10/01/2021   12:03 PM 09/22/2021    1:05 PM 09/19/2021    2:45 AM  CMP  Glucose 70 - 99 mg/dL 137  188  138   BUN 8 - 23 mg/dL 57  62  58   Creatinine 0.44 - 1.00 mg/dL 2.16  2.36  2.47   Sodium 135 - 145 mmol/L 139  137  136   Potassium 3.5 - 5.1 mmol/L 3.2  3.5  3.7   Chloride 98 - 111 mmol/L 95  93  94   CO2 22 - 32 mmol/L 29  27  29    Calcium 8.9 - 10.3 mg/dL 10.0  9.9  9.7  Total Protein 6.5 - 8.1 g/dL 6.8     Total Bilirubin 0.3 - 1.2 mg/dL 1.4     Alkaline Phos 38 - 126 U/L 91     AST 15 - 41 U/L 20     ALT 0 - 44 U/L 10        RADIOGRAPHIC STUDIES: I have personally reviewed the radiological images as listed and agreed with the findings in the report. DG Chest 2 View  Result Date: 09/17/2021 CLINICAL DATA:  Shortness of breath with edema. EXAM: CHEST - 2 VIEW COMPARISON:  Chest x-ray 09/11/2021 FINDINGS: The heart is enlarged, unchanged. Mediastinal silhouette is stable. There is no focal lung infiltrate, pleural effusion or pneumothorax. There is central pulmonary vascular congestion. No acute fractures are seen. IMPRESSION: 1. Cardiomegaly with central pulmonary vascular congestion. Electronically Signed   By: Ronney Asters M.D.   On: 09/17/2021 19:04   ECHOCARDIOGRAM COMPLETE  Result Date:  09/12/2021    ECHOCARDIOGRAM REPORT   Patient Name:   Kristine [name] Date of Exam: 09/12/2021 Medical Rec #:  [id number]            Height:       69.0 in Accession #:    [id number]           Weight:       296.8 lb Date of Birth:  1945-05-17           BSA:          2.443 m Patient Age:    14 years             BP:           113/71 mmHg Patient Gender: F                    HR:           105 bpm. Exam Location:  Inpatient Procedure: 2D Echo, Cardiac Doppler and Color Doppler Indications:    CHF- Acute diastolic  History:        Patient has prior history of Echocardiogram examinations, most                 recent 08/08/2020. CHF; Risk Factors:Hypertension and Diabetes.  Sonographer:    Jefferey Pica Referring Phys: [place]  1. Left ventricular ejection fraction, by estimation, is 55 to 60%. The left ventricle has normal function. The left ventricle has no regional wall motion abnormalities. There is mild left ventricular hypertrophy. Left ventricular diastolic function could not be evaluated. There is the interventricular septum is flattened in diastole ('D' shaped left ventricle), consistent with right ventricular volume overload.  2. Right ventricular systolic function is severely reduced. The right ventricular size is severely enlarged. There is severely elevated pulmonary artery systolic pressure.  3. The mitral valve is normal in structure. No evidence of mitral valve regurgitation.  4. Tricuspid valve regurgitation is mild to moderate.  5. The aortic valve is calcified. Aortic valve regurgitation is not visualized. Mild aortic valve stenosis.  6. The inferior vena cava is dilated in size with <50% respiratory variability, suggesting right atrial pressure of 15 mmHg. FINDINGS  Left Ventricle: Left ventricular ejection fraction, by estimation, is 55 to 60%. The left ventricle has normal function. The left ventricle has no regional wall motion abnormalities. The left ventricular  internal cavity size was normal in size. There is  mild left ventricular hypertrophy. The interventricular septum is flattened in diastole ('D' shaped left  ventricle), consistent with right ventricular volume overload. Left ventricular diastolic function could not be evaluated due to atrial fibrillation. Left ventricular diastolic function could not be evaluated. Right Ventricle: The right ventricular size is severely enlarged. Right vetricular wall thickness was not well visualized. Right ventricular systolic function is severely reduced. There is severely elevated pulmonary artery systolic pressure. The tricuspid regurgitant velocity is 3.37 m/s, and with an assumed right atrial pressure of 15 mmHg, the estimated right ventricular systolic pressure is 21.2 mmHg. Left Atrium: Left atrial size was normal in size. Right Atrium: Right atrial size was normal in size. Pericardium: There is no evidence of pericardial effusion. Mitral Valve: The mitral valve is normal in structure. No evidence of mitral valve regurgitation. Tricuspid Valve: The tricuspid valve is grossly normal. Tricuspid valve regurgitation is mild to moderate. Aortic Valve: The aortic valve is calcified. Aortic valve regurgitation is not visualized. Mild aortic stenosis is present. Aortic valve peak gradient measures 14.1 mmHg. Pulmonic Valve: The pulmonic valve was normal in structure. Pulmonic valve regurgitation is trivial. Aorta: The aortic root and ascending aorta are structurally normal, with no evidence of dilitation. Venous: The inferior vena cava is dilated in size with less than 50% respiratory variability, suggesting right atrial pressure of 15 mmHg. IAS/Shunts: The atrial septum is grossly normal.  LEFT VENTRICLE PLAX 2D LVIDd:         4.10 cm LVIDs:         3.30 cm LV PW:         1.20 cm LV IVS:        1.10 cm LVOT diam:     1.60 cm LV SV:         30 LV SV Index:   12 LVOT Area:     2.01 cm  RIGHT VENTRICLE          IVC RV Basal diam:   3.70 cm  IVC diam: 2.80 cm RV Mid diam:    5.40 cm TAPSE (M-mode): 1.0 cm LEFT ATRIUM             Index        RIGHT ATRIUM           Index LA diam:        4.00 cm 1.64 cm/m   RA Area:     26.90 cm LA Vol (A2C):   57.5 ml 23.53 ml/m  RA Volume:   93.60 ml  38.31 ml/m LA Vol (A4C):   60.2 ml 24.64 ml/m LA Biplane Vol: 61.9 ml 25.33 ml/m  AORTIC VALVE                 PULMONIC VALVE AV Area (Vmax): 0.85 cm     PV Vmax:       0.52 m/s AV Vmax:        187.50 cm/s  PV Peak grad:  1.1 mmHg AV Peak Grad:   14.1 mmHg LVOT Vmax:      79.30 cm/s LVOT Vmean:     44.700 cm/s LVOT VTI:       0.148 m  AORTA Ao Asc diam: 3.50 cm MITRAL VALVE                TRICUSPID VALVE MV Area (PHT): 4.63 cm     TR Peak grad:   45.4 mmHg MV Decel Time: 164 msec     TR Vmax:        337.00 cm/s MV E velocity: 133.00 cm/s  SHUNTS                             Systemic VTI:  0.15 m                             Systemic Diam: 1.60 cm Mertie Moores MD Electronically signed by Mertie Moores MD Signature Date/Time: 09/12/2021/1:16:37 PM    Final    DG Chest 2 View  Result Date: 09/11/2021 CLINICAL DATA:  Shortness of breath. EXAM: CHEST - 2 VIEW COMPARISON:  February 02, 2021 FINDINGS: Mild to moderate severity enlargement of the cardiac silhouette is seen. This is stable in appearance. Mild areas of atelectasis are seen within the mid left lung and bilateral lung bases. Mild perihilar pulmonary vascular prominence is also noted. There is no evidence of a pleural effusion or pneumothorax. No acute osseous abnormalities are seen. IMPRESSION: 1. Mild to moderate severity cardiomegaly with mild pulmonary vascular congestion. 2. Mild bilateral atelectasis. Electronically Signed   By: Virgina Norfolk M.D.   On: 09/11/2021 18:06    ASSESSMENT & PLAN: 75 yo female   Anemia, likely of chronic disease and oral bleeding -We reviewed her extensive medical record in detail with the patient and her spouse. -She has had a  chronic mild intermittent normocytic anemia since at least 2012, previous work-up in 07/2016 showed mildly elevated CRP 14.2, normal sed rate, CK, negative ANA, and normal levels of copper, vitamin D, vitamin B12 (430), folate (>20).  This work-up was repeated on 01/07/2021 which was unremarkable except slightly elevated TSH to 4.72.  Multiple myeloma panel was negative/normal.   -Colonoscopy by Dr. Thornton Park 09/09/2018 with 5 polyps removed, path showed tubular adenomas without high-grade dysplasia.  She has occasional rectal bleeding with straining, improved with dulcolax. -She developed oral bleeding approximately 2 months ago, she received 2 doses IV iron with Feraheme 07/22/2021 and 07/29/2021 per PCP.  -We reviewed her anemia is multifactorial, secondary to chronic disease (CKD, DM, CHF), chronic diabetic wound, and minor bleeding from anticoagulation -ENT and dental were ruled out as bleeding sources -Bleeding is likely secondary to Xarelto toxicity in the setting of CKD, she is on Xarelto for A-fib and stroke prevention, will discuss with cardiology to see if this can be held for bleeding to resolve.  If it does not resolve off anticoagulation we we will recommend further testing -If bleeding does resolve, and cardiology prefers that she go back on anticoagulation, we would recommend either Eliquis or Coumadin which are safer and CKD -Labs today -Follow-up pending discussion with cardiology and labs -Pt seen with Dr. Burr Medico   CKD, stage IV -SCr >2, eGFR in the 20's.  -followed by Dr. Joelyn Oms  -even with anemia of chronic disease hgb 10-13 range, she does not need treatment for this such as ESA at this time  CHF, pulmonary hypertension, A-fib,  -followed by Dr. Acie Fredrickson and recently referred back to Dr. Haroldine Laws for CHF, LVEF 45-50% in 2017 -recently admitted for CHF exacerbation, LVEF 55-60%  -continue regimen per cardiology -will discuss if she is a candidate to hold [facility] to see if bleeding  resolves.   DM with neuropathy and delayed wound healing, Fall risk, spinal stenosis,  -we discussed the risk of bleeding on AC if she falls. She currently has home health and uses walker. -She is a high fall risk due to diabetic neuropathy, balance issue, and chronic  left foot wound   PLAN: -medical record reviewed -bleeding likely secondary to xarelto toxicity in the setting of CKD -discuss holding [facility] with cardiology -lab today -f/up pending discussion with cardiology and labs   Orders Placed This Encounter  Procedures   Protime-INR    Standing Status:   Future    Number of Occurrences:   1    Standing Expiration Date:   10/07/2022   APTT    Standing Status:   Future    Number of Occurrences:   1    Standing Expiration Date:   10/06/2022   DIC (disseminated intravasc coag) panel    Standing Status:   Future    Number of Occurrences:   1    Standing Expiration Date:   10/07/2022   CBC with Differential (Cancer Center Only)    Standing Status:   Future    Number of Occurrences:   1    Standing Expiration Date:   10/07/2022   Ferritin    Standing Status:   Future    Number of Occurrences:   1    Standing Expiration Date:   10/07/2022   Iron and Iron Binding Capacity ([facility] only)    Standing Status:   Future    Number of Occurrences:   1    Standing Expiration Date:   10/07/2022   Retic Panel    Standing Status:   Future    Number of Occurrences:   1    Standing Expiration Date:   10/07/2022    All questions were answered. The patient knows to call the clinic with any problems, questions or concerns.     Alla Feeling, NP 10/06/21   Addendum  I have seen the patient, examined her. I agree with the assessment and and plan and have edited the notes.   76 yo female with multiple comorbidities, including A-fib on Xarelto 50 mg daily, diabetes, diabetic foot open wound, neuropathy, CHF, spinal stenosis, was referred for oral bleeding and anemia.  She has gum bleeding on  exam today.  I think her mucosal bleeding is likely related to Xarelto, in the setting of stage IV chronic kidney disease.  I plan to discuss with her cardiologist to see if we can change to low-dose Eliquis 2.5 mg twice daily, or hold anticoagulation due to her high risk for fall and bleeding.  Her anemia is likely the combination of bleeding, iron deficiency and anemia of chronic disease from CKD, will check iron study, ret count and coagulation tests PT/INR, APTT, DIC etc today. If INR and or APTT elevated, will repeat after she is off Xarelto for 2 weeks. All questions were answered, will call her back with lab results.   Truitt Merle MD  10/06/2021

## 2021-10-07 ENCOUNTER — Other Ambulatory Visit (HOSPITAL_COMMUNITY): Payer: Self-pay

## 2021-10-08 ENCOUNTER — Other Ambulatory Visit: Payer: Self-pay | Admitting: Nurse Practitioner

## 2021-10-08 DIAGNOSIS — K1379 Other lesions of oral mucosa: Secondary | ICD-10-CM

## 2021-10-09 ENCOUNTER — Telehealth (HOSPITAL_COMMUNITY): Payer: Self-pay | Admitting: *Deleted

## 2021-10-10 ENCOUNTER — Telehealth: Payer: Self-pay

## 2021-10-10 ENCOUNTER — Telehealth (HOSPITAL_COMMUNITY): Payer: Self-pay | Admitting: *Deleted

## 2021-10-10 NOTE — Telephone Encounter (Signed)
Pts husband left vm stating pts weight is up 3-4lbs. I called back to get more information no answer/no voice mail set up.

## 2021-10-10 NOTE — Telephone Encounter (Signed)
This nurse reached out to patient per provider request to verify when patient stopped taking her Xarelto.  Also informed per provider orders that she will stop taking Xarelto and will be switched to Eliquis 2.'5mg'$ .  Patient will come for a lab appointment on Tuesday 7/25 to recheck her lab levels.  Acknowledged understanding.  No further questions or concerns at this time.

## 2021-10-13 ENCOUNTER — Other Ambulatory Visit (HOSPITAL_COMMUNITY): Payer: Self-pay

## 2021-10-13 ENCOUNTER — Other Ambulatory Visit: Payer: Self-pay

## 2021-10-13 DIAGNOSIS — D649 Anemia, unspecified: Secondary | ICD-10-CM

## 2021-10-14 ENCOUNTER — Inpatient Hospital Stay: Payer: Medicare HMO

## 2021-10-14 ENCOUNTER — Other Ambulatory Visit: Payer: Self-pay

## 2021-10-14 DIAGNOSIS — K1379 Other lesions of oral mucosa: Secondary | ICD-10-CM

## 2021-10-14 DIAGNOSIS — N184 Chronic kidney disease, stage 4 (severe): Secondary | ICD-10-CM | POA: Diagnosis not present

## 2021-10-14 DIAGNOSIS — D649 Anemia, unspecified: Secondary | ICD-10-CM

## 2021-10-14 LAB — CBC WITH DIFFERENTIAL (CANCER CENTER ONLY)
Abs Immature Granulocytes: 0.13 10*3/uL — ABNORMAL HIGH (ref 0.00–0.07)
Basophils Absolute: 0.1 10*3/uL (ref 0.0–0.1)
Basophils Relative: 1 %
Eosinophils Absolute: 0.2 10*3/uL (ref 0.0–0.5)
Eosinophils Relative: 2 %
HCT: 32.9 % — ABNORMAL LOW (ref 36.0–46.0)
Hemoglobin: 10.8 g/dL — ABNORMAL LOW (ref 12.0–15.0)
Immature Granulocytes: 1 %
Lymphocytes Relative: 13 %
Lymphs Abs: 1.6 10*3/uL (ref 0.7–4.0)
MCH: 29.7 pg (ref 26.0–34.0)
MCHC: 32.8 g/dL (ref 30.0–36.0)
MCV: 90.4 fL (ref 80.0–100.0)
Monocytes Absolute: 0.9 10*3/uL (ref 0.1–1.0)
Monocytes Relative: 8 %
Neutro Abs: 9 10*3/uL — ABNORMAL HIGH (ref 1.7–7.7)
Neutrophils Relative %: 75 %
Platelet Count: 164 10*3/uL (ref 150–400)
RBC: 3.64 MIL/uL — ABNORMAL LOW (ref 3.87–5.11)
RDW: 16.8 % — ABNORMAL HIGH (ref 11.5–15.5)
WBC Count: 11.9 10*3/uL — ABNORMAL HIGH (ref 4.0–10.5)
nRBC: 0 % (ref 0.0–0.2)

## 2021-10-14 LAB — CMP (CANCER CENTER ONLY)
ALT: 6 U/L (ref 0–44)
AST: 15 U/L (ref 15–41)
Albumin: 4.3 g/dL (ref 3.5–5.0)
Alkaline Phosphatase: 117 U/L (ref 38–126)
Anion gap: 10 (ref 5–15)
BUN: 77 mg/dL — ABNORMAL HIGH (ref 8–23)
CO2: 30 mmol/L (ref 22–32)
Calcium: 9.9 mg/dL (ref 8.9–10.3)
Chloride: 95 mmol/L — ABNORMAL LOW (ref 98–111)
Creatinine: 2.5 mg/dL — ABNORMAL HIGH (ref 0.44–1.00)
GFR, Estimated: 20 mL/min — ABNORMAL LOW (ref >60)
Glucose, Bld: 113 mg/dL — ABNORMAL HIGH (ref 70–99)
Potassium: 3.5 mmol/L (ref 3.5–5.1)
Sodium: 135 mmol/L (ref 135–145)
Total Bilirubin: 1.7 mg/dL — ABNORMAL HIGH (ref 0.3–1.2)
Total Protein: 7.3 g/dL (ref 6.5–8.1)

## 2021-10-14 LAB — DIC (DISSEMINATED INTRAVASCULAR COAGULATION)PANEL
D-Dimer, Quant: 0.75 ug/mL-FEU — ABNORMAL HIGH (ref 0.00–0.50)
Fibrinogen: 564 mg/dL — ABNORMAL HIGH (ref 210–475)
INR: 1.1 (ref 0.8–1.2)
Platelets: 167 10*3/uL (ref 150–400)
Prothrombin Time: 14.4 seconds (ref 11.4–15.2)
Smear Review: NONE SEEN
aPTT: 37 seconds — ABNORMAL HIGH (ref 24–36)

## 2021-10-15 ENCOUNTER — Other Ambulatory Visit: Payer: Self-pay | Admitting: Nurse Practitioner

## 2021-10-15 ENCOUNTER — Encounter: Payer: Self-pay | Admitting: Nurse Practitioner

## 2021-10-15 ENCOUNTER — Other Ambulatory Visit: Payer: Self-pay

## 2021-10-15 MED ORDER — APIXABAN 2.5 MG PO TABS: 2.5000 mg | ORAL_TABLET | Freq: Two times a day (BID) | ORAL | 6 refills | Status: DC

## 2021-10-16 ENCOUNTER — Telehealth: Payer: Self-pay | Admitting: Neurology

## 2021-10-16 ENCOUNTER — Telehealth: Payer: Self-pay | Admitting: Cardiovascular Disease

## 2021-10-16 NOTE — Telephone Encounter (Signed)
Received GTL form from [facility] gave to POD 2 to complete

## 2021-10-16 NOTE — Telephone Encounter (Signed)
Pt wife called asking about a cpap, call transferred to Gershon Cull, [address]

## 2021-10-16 NOTE — Telephone Encounter (Signed)
Called husband to inform him I spoke with [facility] (stephanie) and they have the patient on schedule to call for her cpap setup on Friday 10/16/21.

## 2021-10-16 NOTE — Telephone Encounter (Signed)
We completed this form 4 months ago, unsure why this is needed again?  Hilda Blades could you check how often this is to be renewed?  Thanks!

## 2021-10-17 ENCOUNTER — Telehealth: Payer: Self-pay | Admitting: Hematology

## 2021-10-17 NOTE — Telephone Encounter (Signed)
Scheduled per 7/27 in basket, attempted to call but voicemail not set up, will mail calender

## 2021-10-19 ENCOUNTER — Other Ambulatory Visit: Payer: Self-pay

## 2021-10-19 ENCOUNTER — Emergency Department (HOSPITAL_COMMUNITY)
Admission: EM | Admit: 2021-10-19 | Discharge: 2021-10-19 | Disposition: A | Discharge status: Home / Self Care | Payer: Medicare HMO | Attending: Emergency Medicine | Admitting: Emergency Medicine

## 2021-10-19 ENCOUNTER — Encounter (HOSPITAL_COMMUNITY): Payer: Self-pay

## 2021-10-19 DIAGNOSIS — R609 Edema, unspecified: Secondary | ICD-10-CM | POA: Insufficient documentation

## 2021-10-19 DIAGNOSIS — T148XXA Other injury of unspecified body region, initial encounter: Secondary | ICD-10-CM

## 2021-10-19 DIAGNOSIS — Z7901 Long term (current) use of anticoagulants: Secondary | ICD-10-CM | POA: Insufficient documentation

## 2021-10-19 DIAGNOSIS — L7622 Postprocedural hemorrhage and hematoma of skin and subcutaneous tissue following other procedure: Secondary | ICD-10-CM | POA: Diagnosis not present

## 2021-10-19 DIAGNOSIS — I4891 Unspecified atrial fibrillation: Secondary | ICD-10-CM | POA: Insufficient documentation

## 2021-10-19 DIAGNOSIS — R2242 Localized swelling, mass and lump, left lower limb: Secondary | ICD-10-CM | POA: Diagnosis present

## 2021-10-19 LAB — COMPREHENSIVE METABOLIC PANEL
ALT: 10 U/L (ref 0–44)
AST: 18 U/L (ref 15–41)
Albumin: 3.9 g/dL (ref 3.5–5.0)
Alkaline Phosphatase: 98 U/L (ref 38–126)
Anion gap: 14 (ref 5–15)
BUN: 76 mg/dL — ABNORMAL HIGH (ref 8–23)
CO2: 26 mmol/L (ref 22–32)
Calcium: 9.5 mg/dL (ref 8.9–10.3)
Chloride: 97 mmol/L — ABNORMAL LOW (ref 98–111)
Creatinine, Ser: 2.12 mg/dL — ABNORMAL HIGH (ref 0.44–1.00)
GFR, Estimated: 24 mL/min — ABNORMAL LOW (ref >60)
Glucose, Bld: 114 mg/dL — ABNORMAL HIGH (ref 70–99)
Potassium: 3.9 mmol/L (ref 3.5–5.1)
Sodium: 137 mmol/L (ref 135–145)
Total Bilirubin: 1.6 mg/dL — ABNORMAL HIGH (ref 0.3–1.2)
Total Protein: 7.3 g/dL (ref 6.5–8.1)

## 2021-10-19 LAB — CBC WITH DIFFERENTIAL/PLATELET
Abs Immature Granulocytes: 0.21 10*3/uL — ABNORMAL HIGH (ref 0.00–0.07)
Basophils Absolute: 0.1 10*3/uL (ref 0.0–0.1)
Basophils Relative: 1 %
Eosinophils Absolute: 0.3 10*3/uL (ref 0.0–0.5)
Eosinophils Relative: 2 %
HCT: 36.8 % (ref 36.0–46.0)
Hemoglobin: 11.5 g/dL — ABNORMAL LOW (ref 12.0–15.0)
Immature Granulocytes: 2 %
Lymphocytes Relative: 14 %
Lymphs Abs: 1.8 10*3/uL (ref 0.7–4.0)
MCH: 29 pg (ref 26.0–34.0)
MCHC: 31.3 g/dL (ref 30.0–36.0)
MCV: 92.7 fL (ref 80.0–100.0)
Monocytes Absolute: 1 10*3/uL (ref 0.1–1.0)
Monocytes Relative: 8 %
Neutro Abs: 9.3 10*3/uL — ABNORMAL HIGH (ref 1.7–7.7)
Neutrophils Relative %: 73 %
Platelets: 171 10*3/uL (ref 150–400)
RBC: 3.97 MIL/uL (ref 3.87–5.11)
RDW: 17.2 % — ABNORMAL HIGH (ref 11.5–15.5)
WBC: 12.6 10*3/uL — ABNORMAL HIGH (ref 4.0–10.5)
nRBC: 0 % (ref 0.0–0.2)

## 2021-10-19 MED ORDER — DOXYCYCLINE HYCLATE 100 MG PO TABS
100.0000 mg | ORAL_TABLET | Freq: Once | ORAL | Status: AC
  Administered 2021-10-19: 100 mg via ORAL
  Filled 2021-10-19: qty 1

## 2021-10-19 MED ORDER — DOXYCYCLINE HYCLATE 100 MG PO CAPS: 100.0000 mg | ORAL_CAPSULE | Freq: Two times a day (BID) | ORAL | 0 refills | Status: AC

## 2021-10-19 NOTE — ED Notes (Signed)
Patient given discharge instructions, all questions answered. Patient in possession of all belongings, directed to the discharge area  

## 2021-10-19 NOTE — ED Triage Notes (Signed)
Pt sts waking up this morning in a puddle of blood. Left knee began bleeding heavily. Also sts left lower extremity swelling and redness.   Reports having a large hematoma on knee that was opened  had had a drain placed back in march 2023. Takes Eliquis daily for afib.

## 2021-10-19 NOTE — Discharge Instructions (Signed)
I recommend that you keep the pressure bandages on your left leg for 24 hours.  After that you can take down the bandages and leave a Band-Aid over the cut itself.  It is not clear whether the redness in your leg is from the skin infection or from fluid in your leg.  I decided to start you on 7 days of an antibiotic called doxycycline to treat for possible infection.  You should follow-up with your doctors office for this issue today.  You should also discuss your continued Eliquis use or disuse with your primary care provider.  I do think it is reasonable for you to stop taking Eliquis for the next 2 days given your bleeding concerns.

## 2021-10-19 NOTE — ED Provider Notes (Signed)
[facility] DEPT Provider Note   CSN: [id number] Arrival date & time: 10/19/21  0451     History  Chief Complaint  Patient presents with   Knee Injury   Leg Swelling    Kristine [name] is a 76 y.o. female Presenting to ED with a bleed from her left lower leg.  She is on Eliquis. She reports she had an issue with a hematoma in her lower leg that required a drain that was placed and removed about 8 weeks ago.  Last night she woke up and found that she was lying in "a puddle of blood".  Her husband reports that she was bleeding from her left anterior leg.  He was able to apply several rounds of pressure bandages and bleeding is since stopped.  The patient reports he called her doctor's office that she was told to stop taking the Eliquis.  HPI     Home Medications Prior to Admission medications   Medication Sig Start Date End Date Taking? Authorizing Provider  doxycycline (VIBRAMYCIN) 100 MG capsule Take 1 capsule (100 mg total) by mouth 2 (two) times daily for 7 days. 10/19/21 10/26/21 Yes Toniyah Dilmore, Carola Rhine, MD  acetaminophen (TYLENOL) 500 MG tablet Take 1,000 mg by mouth every 6 (six) hours as needed for moderate pain or headache.    [provider]  allopurinol (ZYLOPRIM) 300 MG tablet Take 300 mg by mouth in the morning.    [provider]  ALPRAZolam Duanne Moron) 0.5 MG tablet Take 0.5 mg by mouth every 6 (six) hours as needed for anxiety.    [provider]  apixaban (ELIQUIS) 2.5 MG TABS tablet Take 1 tablet (2.5 mg total) by mouth 2 (two) times daily. 10/15/21   Alla Feeling, NP  BD PEN NEEDLE NANO U/F 32G X 4 MM MISC Inject 1 each as directed See admin instructions. Use pen needles with insulin pens daily 12/24/14   [provider]  colchicine 0.6 MG tablet Take 0.6 mg by mouth daily as needed (gout flares). 04/07/21   [provider]  diclofenac Sodium (VOLTAREN) 1 % GEL Apply 4 g topically 4 (four) times  daily as needed (back pain). 09/19/21   Barton Dubois, MD  docusate sodium (COLACE) 100 MG capsule Take 100 mg by mouth daily as needed for mild constipation.    [provider]  DULoxetine (CYMBALTA) 30 MG capsule Take 30 mg by mouth in the morning. 09/26/20   [provider]  estradiol (ESTRACE) 0.1 MG/GM vaginal cream Place 1 Applicatorful vaginally as needed (vaginal irritation). 12/18/16   [provider]  furosemide (LASIX) 80 MG tablet TAKE 1 AND 1/2 TABLETS BY MOUTH TWICE DAILY 10/06/21   Bensimhon, Shaune Pascal, MD  HYDROcodone-acetaminophen (NORCO/VICODIN) 5-325 MG tablet Take 1 tablet by mouth every 6 (six) hours as needed for severe pain. 04/09/21   Etta Quill, NP  insulin aspart (NOVOLOG FLEXPEN) 100 UNIT/ML FlexPen 40-70 Units See admin instructions. Less than 150 units 150=40 units151-199=45 units 200-249=50 units 250-299=55 units 300-349=60 units    [provider]  Insulin Degludec (TRESIBA FLEXTOUCH) 200 UNIT/ML SOPN Inject 100 Units into the skin in the morning and at bedtime.    [provider]  levothyroxine (SYNTHROID) 88 MCG tablet Take 88 mcg by mouth every morning. 08/14/21   [provider]  lidocaine-prilocaine (EMLA) cream 1 gram qid prn 01/07/21   Marcial Pacas, MD  LYRICA 50 MG capsule Take 50 mg by mouth 2 (two)  times daily. 06/01/17   [provider]  metolazone (ZAROXOLYN) 2.5 MG tablet Take 1 tablet (2.5 mg total) by mouth 3 (three) times a week. Mon, Wed, Fri. 09/22/21   Barton Dubois, MD  metoprolol succinate (TOPROL-XL) 50 MG 24 hr tablet Take 1/2 tablet (25 mg total) by mouth daily. 09/19/21   Barton Dubois, MD  nystatin (MYCOSTATIN/NYSTOP) powder Apply 1 application topically as needed (skin irritation/rash). 01/13/19   [provider]  ondansetron (ZOFRAN) 4 MG tablet Take 1 tablet (4 mg total) by mouth every 8 (eight) hours as needed for nausea or vomiting. 05/21/21   Scheeler, Carola Rhine, PA-C  Polyethyl  Glycol-Propyl Glycol (SYSTANE OP) Place 1 drop into both eyes 2 (two) times daily as needed (dry eyes).    [provider]  polyethylene glycol (MIRALAX / GLYCOLAX) 17 g packet Take 17 g by mouth daily as needed (constipation.).    [provider]  potassium chloride SA (KLOR-CON M) 20 MEQ tablet Take 3 tablets (60 mEq total) by mouth every morning AND 2 tablets (40 mEq total) every evening. 10/03/21   Rosita Fire, Brittainy M, PA-C  REGRANEX 0.01 % gel Apply 1 Application topically daily. Apply to left foot 09/09/21   [provider]  traMADol (ULTRAM) 50 MG tablet Take 50 mg by mouth 3 (three) times daily as needed (severe neuropathy pain.). 06/24/19   [provider]  Vitamin D, Ergocalciferol, (DRISDOL) 50000 UNITS CAPS Take 50,000 Units by mouth every Monday.    [provider]      Allergies    Albuterol, Epinephrine, Penicillins, Ceftriaxone, Citalopram hydrobromide, Exenatide, Ketorolac tromethamine, Tizanidine, Torsemide, Zoledronic acid, Bee venom, Ivp dye [iodinated contrast media], and Keflex [cephalexin]    Review of Systems   Review of Systems  Physical Exam Updated Vital Signs BP 131/64   Pulse 65   Temp 97.6 F (36.4 C) (Oral)   Resp 18   Ht '5\' 9"'$  (1.753 m)   Wt 131.3 kg   SpO2 99%   BMI 42.75 kg/m  Physical Exam Constitutional:      General: She is not in acute distress. HENT:     Head: Normocephalic and atraumatic.  Eyes:     Conjunctiva/sclera: Conjunctivae normal.     Pupils: Pupils are equal, round, and reactive to light.  Cardiovascular:     Rate and Rhythm: Normal rate and regular rhythm.  Pulmonary:     Effort: Pulmonary effort is normal. No respiratory distress.  Abdominal:     General: There is no distension.     Tenderness: There is no abdominal tenderness.  Skin:    General: Skin is warm and dry.     Comments: Erythema, edema and warmth of the left lower extremity Small abrasion to the left anterior leg below  the patella, no active bleeding  Neurological:     General: No focal deficit present.     Mental Status: She is alert. Mental status is at baseline.  Psychiatric:        Mood and Affect: Mood normal.        Behavior: Behavior normal.     ED Results / Procedures / Treatments   Labs (all labs ordered are listed, but only abnormal results are displayed) Labs Reviewed  CBC WITH DIFFERENTIAL/PLATELET - Abnormal; Notable for the following components:      Result Value   WBC 12.6 (*)    Hemoglobin 11.5 (*)    RDW 17.2 (*)    Neutro Abs 9.3 (*)  Abs Immature Granulocytes 0.21 (*)    All other components within normal limits  COMPREHENSIVE METABOLIC PANEL - Abnormal; Notable for the following components:   Chloride 97 (*)    Glucose, Bld 114 (*)    BUN 76 (*)    Creatinine, Ser 2.12 (*)    Total Bilirubin 1.6 (*)    GFR, Estimated 24 (*)    All other components within normal limits    EKG None  Radiology No results found.  Procedures Procedures    Medications Ordered in ED Medications  doxycycline (VIBRA-TABS) tablet 100 mg (100 mg Oral Given 10/19/21 1210)    ED Course/ Medical Decision Making/ A&P                           Medical Decision Making Amount and/or Complexity of Data Reviewed Labs: ordered.  Risk Prescription drug management.   Patient is here with leg bleed, which may be related to a small superficial laceration.  He does have significant edema of the lower extremities.  There is some redness and warmth of the left lower leg but it has also been wrapped with a pressure bandage.  She feels the redness is new.  I think it is reasonable to trial a course of antibiotic treatment, for possible cellulitis, although I also explained to them that this could be stasis dermatitis.  She has chronic diabetic neuropathy and poor sensation in her lower extremities.  The bleeding has been hemostatic for the approximate 7 hours that she has been in the waiting room  and in the ER.  I told them I am okay with her holding her Eliquis for the next 2 days, but they should have a risk-benefit discussion with her PCP or prescribing doctor if they are planning to stop the Eliquis longer-term.  Stopping Eliquis can put her at higher risk for clotting.  She says she is on Eliquis for her heart or A-fib.        Final Clinical Impression(s) / ED Diagnoses Final diagnoses:  Peripheral edema  Bleeding from wound    Rx / DC Orders ED Discharge Orders          Ordered    doxycycline (VIBRAMYCIN) 100 MG capsule  2 times daily        10/19/21 1211              Wyvonnia Dusky, MD 10/19/21 1212

## 2021-10-20 ENCOUNTER — Encounter (HOSPITAL_COMMUNITY): Payer: Medicare HMO

## 2021-10-20 ENCOUNTER — Other Ambulatory Visit (HOSPITAL_COMMUNITY): Payer: Self-pay

## 2021-10-20 NOTE — Telephone Encounter (Signed)
Please have Dr. Krista Blue complete this 66 Statement for the above client.  Kristine [name] has a [employer] that will reimburse eligible home health services.  However, we must submit a  Physician's Statement indicating that she cannot perform 2 of the 6 Activities of Daily Living without assistance. Received this in an email from [facility]. She was very irate with me last week when we spoke and I had inquired about that as well since we recently had completed this form. Not sure how often it needs to be completed she had just let me know it needed to be done again. Attached her contact info if needed.   [place] [address] Cell:       [phone] Office:     [phone]  Fax:         [phone] Email:      [email or web address] Website:  [email or web address]

## 2021-10-20 NOTE — Telephone Encounter (Addendum)
GTL form has been completed and faxed several different times today and all have received fax failed. I have sent e-mail with attachments to address listed below.

## 2021-10-21 ENCOUNTER — Other Ambulatory Visit: Payer: Self-pay

## 2021-10-21 NOTE — Telephone Encounter (Signed)
I also sent email to [place] with patients GTL form as she emailed me and stated "CAN I GET THIS INFORMATION?????"

## 2021-10-22 ENCOUNTER — Other Ambulatory Visit: Payer: Self-pay

## 2021-10-23 NOTE — Telephone Encounter (Signed)
Patient called again today saying she still has not heard anything from [facility] about her cpap setup. Reached out to [facility] spoke to [place] and she states they only need to verify patients insurance and send the order. The patient says if nothing happen this time they would like for me to pull the order and send to another dme. [facility] will call the patient today to tell them the next step of care.

## 2021-10-24 ENCOUNTER — Ambulatory Visit: Payer: Medicare HMO | Admitting: Surgical

## 2021-10-24 ENCOUNTER — Telehealth: Payer: Self-pay | Admitting: Plastic Surgery

## 2021-10-24 VITALS — BP 113/72 | HR 87 | Temp 97.6°F

## 2021-10-24 DIAGNOSIS — R238 Other skin changes: Secondary | ICD-10-CM | POA: Diagnosis not present

## 2021-10-24 DIAGNOSIS — S8002XA Contusion of left knee, initial encounter: Secondary | ICD-10-CM

## 2021-10-24 DIAGNOSIS — Z794 Long term (current) use of insulin: Secondary | ICD-10-CM

## 2021-10-24 DIAGNOSIS — R600 Localized edema: Secondary | ICD-10-CM | POA: Diagnosis not present

## 2021-10-24 DIAGNOSIS — N184 Chronic kidney disease, stage 4 (severe): Secondary | ICD-10-CM

## 2021-10-24 DIAGNOSIS — I4819 Other persistent atrial fibrillation: Secondary | ICD-10-CM

## 2021-10-24 DIAGNOSIS — S81002A Unspecified open wound, left knee, initial encounter: Secondary | ICD-10-CM

## 2021-10-24 NOTE — Progress Notes (Addendum)
Referring Provider Prince Solian, MD [address] [place],  [place] [address]   CC:  Chief Complaint  Patient presents with   Follow-up      Kristine [name] is an 76 y.o. female.  HPI: Patient is a 76 year old female here for evaluation of her left knee.  She is a prior patient of our practice, initially treated for a large hematoma of her left knee after falling at home.  She was on Xarelto at the time.  She underwent drainage of left knee hematoma and placement of a drain on 05/21/2021 with Dr. Erin Hearing.  She healed well from this and was last seen in the clinic on 07/11/2021 and recommended to follow-up as needed for any questions or changes.  She has past medical history significant for CKD, A-fib, COPD, morbid obesity, OSA2, pulmonary hypertension.  She was recently seen in the ED on 10/19/2021 for evaluation of bleeding from her left lower leg.  She was on Eliquis at that time.  She has since stopped the Eliquis and reports she is scheduled to see her PCP to further discuss on 10/31/2021.  She reports overall she is feeling well, does not feel as if she has had much changes to the lower extremity other than the new wound that is present.  She reports that she has very poor feeling, if any sensation in her left lower extremity and therefore does not recall any trauma to the area.  She denies any infectious symptoms, denies any changes in her chronic cardiac or pulmonary symptoms.  She specifically declines chest pain and shortness of breath today.  Review of Systems General: No fevers or chills  Physical Exam    10/19/2021   12:24 PM 10/19/2021   11:28 AM 10/19/2021   10:15 AM  Vitals with BMI  Systolic [phone]  Diastolic 82 64 68  Pulse 82 65 86    General:  No acute distress,  Alert and oriented, Non-Toxic, Normal speech and affect Left lower extremity: Left lower extremity with chronic skin changes, previous incision along the anterior knee that is approximately 3 cm  in length.  This incision is well-healed.  Adjacent to the incision she does have a wound that is approximately 2.3 x 1.5 cm.  It is full-thickness.  There is no surrounding erythema or cellulitic changes, however there is some surrounding chronic skin changes noted.  I do not appreciate any foul odors.  She does have pitting edema of the left lower extremity.  Difficulty palpating pulse due to swelling.     Assessment/Plan  Patient with left knee wound, appears to be an abrasion/skin tear from a trauma, likely had significant bleeding due to the Eliquis.  I do not see any signs of infection on exam.  She does have chronic skin changes of her left lower extremity, likely related to stasis dermatitis.  Discussed Xeroform dressing changes to left knee wound daily.  We will order patient supplies through [facility] for dressing changes.  All of her question and her husband's questions were answered to their content in regards to wound care.  Recommend following up in approximately 7 to 10 days for reevaluation, would like to have close follow-up due to her history of poor sensation in the area and her comorbidities.  Recommend following up with PCP to discuss restarting blood thinners.  They reported she has an appointment 1 week from today.  Pictures taken place in the patient's chart with patient's permission.  Call with questions  or concerns.   Kristine [name] 10/24/2021, 2:24 PM

## 2021-10-24 NOTE — Telephone Encounter (Signed)
Pt seen in office today.

## 2021-10-24 NOTE — Telephone Encounter (Signed)
Pt is calling in stating that she has gone to the ER twice and the PCP was contacted and she has an appt with them Monday for a wound on her knee.  It was bleeding she has since got it to stop and would like to know what she should do about it.  Pt would like to have a call back.

## 2021-10-27 ENCOUNTER — Telehealth: Payer: Self-pay

## 2021-10-27 NOTE — Telephone Encounter (Signed)
Return fax from [facility]: Service has been provided for the patient; no further action is required.

## 2021-10-28 ENCOUNTER — Other Ambulatory Visit: Payer: Self-pay

## 2021-10-28 ENCOUNTER — Encounter (HOSPITAL_COMMUNITY): Payer: Self-pay

## 2021-10-28 ENCOUNTER — Emergency Department (HOSPITAL_BASED_OUTPATIENT_CLINIC_OR_DEPARTMENT_OTHER): Payer: Medicare HMO

## 2021-10-28 ENCOUNTER — Encounter (HOSPITAL_COMMUNITY)
Admission: RE | Admit: 2021-10-28 | Discharge: 2021-10-28 | Disposition: A | Discharge status: Home / Self Care | Payer: Medicare HMO | Source: Ambulatory Visit | Attending: Cardiology | Admitting: Cardiology

## 2021-10-28 ENCOUNTER — Telehealth: Payer: Self-pay | Admitting: Physician Assistant

## 2021-10-28 ENCOUNTER — Inpatient Hospital Stay (HOSPITAL_BASED_OUTPATIENT_CLINIC_OR_DEPARTMENT_OTHER)
Admission: EM | Admit: 2021-10-28 | Discharge: 2021-11-10 | DRG: 871 | Disposition: A | Discharge status: Home Health | Payer: Medicare HMO | Attending: Internal Medicine | Admitting: Internal Medicine

## 2021-10-28 ENCOUNTER — Encounter (HOSPITAL_BASED_OUTPATIENT_CLINIC_OR_DEPARTMENT_OTHER): Payer: Self-pay

## 2021-10-28 ENCOUNTER — Other Ambulatory Visit (HOSPITAL_COMMUNITY): Payer: Self-pay | Admitting: Cardiology

## 2021-10-28 DIAGNOSIS — G9340 Encephalopathy, unspecified: Principal | ICD-10-CM

## 2021-10-28 DIAGNOSIS — E1122 Type 2 diabetes mellitus with diabetic chronic kidney disease: Secondary | ICD-10-CM | POA: Diagnosis present

## 2021-10-28 DIAGNOSIS — L89892 Pressure ulcer of other site, stage 2: Secondary | ICD-10-CM | POA: Diagnosis present

## 2021-10-28 DIAGNOSIS — I5042 Chronic combined systolic (congestive) and diastolic (congestive) heart failure: Secondary | ICD-10-CM | POA: Insufficient documentation

## 2021-10-28 DIAGNOSIS — N184 Chronic kidney disease, stage 4 (severe): Secondary | ICD-10-CM | POA: Diagnosis present

## 2021-10-28 DIAGNOSIS — Z8249 Family history of ischemic heart disease and other diseases of the circulatory system: Secondary | ICD-10-CM

## 2021-10-28 DIAGNOSIS — K7460 Unspecified cirrhosis of liver: Secondary | ICD-10-CM | POA: Diagnosis present

## 2021-10-28 DIAGNOSIS — N179 Acute kidney failure, unspecified: Secondary | ICD-10-CM | POA: Diagnosis present

## 2021-10-28 DIAGNOSIS — R32 Unspecified urinary incontinence: Secondary | ICD-10-CM | POA: Diagnosis present

## 2021-10-28 DIAGNOSIS — R4182 Altered mental status, unspecified: Secondary | ICD-10-CM | POA: Diagnosis not present

## 2021-10-28 DIAGNOSIS — M810 Age-related osteoporosis without current pathological fracture: Secondary | ICD-10-CM | POA: Diagnosis present

## 2021-10-28 DIAGNOSIS — Z9851 Tubal ligation status: Secondary | ICD-10-CM

## 2021-10-28 DIAGNOSIS — L03115 Cellulitis of right lower limb: Secondary | ICD-10-CM | POA: Diagnosis present

## 2021-10-28 DIAGNOSIS — R7881 Bacteremia: Secondary | ICD-10-CM

## 2021-10-28 DIAGNOSIS — Z881 Allergy status to other antibiotic agents status: Secondary | ICD-10-CM

## 2021-10-28 DIAGNOSIS — E662 Morbid (severe) obesity with alveolar hypoventilation: Secondary | ICD-10-CM | POA: Diagnosis present

## 2021-10-28 DIAGNOSIS — A419 Sepsis, unspecified organism: Principal | ICD-10-CM

## 2021-10-28 DIAGNOSIS — Z6841 Body Mass Index (BMI) 40.0 and over, adult: Secondary | ICD-10-CM

## 2021-10-28 DIAGNOSIS — I130 Hypertensive heart and chronic kidney disease with heart failure and stage 1 through stage 4 chronic kidney disease, or unspecified chronic kidney disease: Secondary | ICD-10-CM | POA: Diagnosis present

## 2021-10-28 DIAGNOSIS — Z20822 Contact with and (suspected) exposure to covid-19: Secondary | ICD-10-CM | POA: Diagnosis present

## 2021-10-28 DIAGNOSIS — I4821 Permanent atrial fibrillation: Secondary | ICD-10-CM | POA: Diagnosis present

## 2021-10-28 DIAGNOSIS — F32A Depression, unspecified: Secondary | ICD-10-CM | POA: Diagnosis present

## 2021-10-28 DIAGNOSIS — A401 Sepsis due to streptococcus, group B: Secondary | ICD-10-CM | POA: Diagnosis not present

## 2021-10-28 DIAGNOSIS — L97919 Non-pressure chronic ulcer of unspecified part of right lower leg with unspecified severity: Secondary | ICD-10-CM | POA: Diagnosis present

## 2021-10-28 DIAGNOSIS — E7800 Pure hypercholesterolemia, unspecified: Secondary | ICD-10-CM | POA: Diagnosis present

## 2021-10-28 DIAGNOSIS — I5043 Acute on chronic combined systolic (congestive) and diastolic (congestive) heart failure: Secondary | ICD-10-CM | POA: Diagnosis present

## 2021-10-28 DIAGNOSIS — I4891 Unspecified atrial fibrillation: Secondary | ICD-10-CM

## 2021-10-28 DIAGNOSIS — Z79890 Hormone replacement therapy (postmenopausal): Secondary | ICD-10-CM

## 2021-10-28 DIAGNOSIS — I071 Rheumatic tricuspid insufficiency: Secondary | ICD-10-CM | POA: Diagnosis present

## 2021-10-28 DIAGNOSIS — E559 Vitamin D deficiency, unspecified: Secondary | ICD-10-CM | POA: Diagnosis present

## 2021-10-28 DIAGNOSIS — E11649 Type 2 diabetes mellitus with hypoglycemia without coma: Secondary | ICD-10-CM | POA: Diagnosis not present

## 2021-10-28 DIAGNOSIS — Z90710 Acquired absence of both cervix and uterus: Secondary | ICD-10-CM

## 2021-10-28 DIAGNOSIS — F419 Anxiety disorder, unspecified: Secondary | ICD-10-CM | POA: Diagnosis present

## 2021-10-28 DIAGNOSIS — E876 Hypokalemia: Secondary | ICD-10-CM | POA: Diagnosis present

## 2021-10-28 DIAGNOSIS — J9601 Acute respiratory failure with hypoxia: Secondary | ICD-10-CM | POA: Diagnosis present

## 2021-10-28 DIAGNOSIS — I43 Cardiomyopathy in diseases classified elsewhere: Secondary | ICD-10-CM | POA: Diagnosis present

## 2021-10-28 DIAGNOSIS — Z9841 Cataract extraction status, right eye: Secondary | ICD-10-CM

## 2021-10-28 DIAGNOSIS — Z9842 Cataract extraction status, left eye: Secondary | ICD-10-CM

## 2021-10-28 DIAGNOSIS — E854 Organ-limited amyloidosis: Secondary | ICD-10-CM | POA: Diagnosis present

## 2021-10-28 DIAGNOSIS — Z8701 Personal history of pneumonia (recurrent): Secondary | ICD-10-CM

## 2021-10-28 DIAGNOSIS — Z7901 Long term (current) use of anticoagulants: Secondary | ICD-10-CM

## 2021-10-28 DIAGNOSIS — I82A11 Acute embolism and thrombosis of right axillary vein: Secondary | ICD-10-CM | POA: Diagnosis present

## 2021-10-28 DIAGNOSIS — I10 Essential (primary) hypertension: Secondary | ICD-10-CM | POA: Diagnosis present

## 2021-10-28 DIAGNOSIS — G8929 Other chronic pain: Secondary | ICD-10-CM | POA: Diagnosis present

## 2021-10-28 DIAGNOSIS — E1142 Type 2 diabetes mellitus with diabetic polyneuropathy: Secondary | ICD-10-CM | POA: Diagnosis present

## 2021-10-28 DIAGNOSIS — L8990 Pressure ulcer of unspecified site, unspecified stage: Secondary | ICD-10-CM | POA: Insufficient documentation

## 2021-10-28 DIAGNOSIS — Z794 Long term (current) use of insulin: Secondary | ICD-10-CM

## 2021-10-28 DIAGNOSIS — G9341 Metabolic encephalopathy: Secondary | ICD-10-CM | POA: Diagnosis present

## 2021-10-28 DIAGNOSIS — I428 Other cardiomyopathies: Secondary | ICD-10-CM | POA: Diagnosis present

## 2021-10-28 DIAGNOSIS — Z66 Do not resuscitate: Secondary | ICD-10-CM | POA: Diagnosis present

## 2021-10-28 DIAGNOSIS — Z91041 Radiographic dye allergy status: Secondary | ICD-10-CM

## 2021-10-28 DIAGNOSIS — D689 Coagulation defect, unspecified: Secondary | ICD-10-CM | POA: Diagnosis present

## 2021-10-28 DIAGNOSIS — J449 Chronic obstructive pulmonary disease, unspecified: Secondary | ICD-10-CM | POA: Diagnosis present

## 2021-10-28 DIAGNOSIS — E119 Type 2 diabetes mellitus without complications: Secondary | ICD-10-CM

## 2021-10-28 DIAGNOSIS — M1990 Unspecified osteoarthritis, unspecified site: Secondary | ICD-10-CM | POA: Diagnosis present

## 2021-10-28 DIAGNOSIS — Z961 Presence of intraocular lens: Secondary | ICD-10-CM | POA: Diagnosis present

## 2021-10-28 DIAGNOSIS — I4819 Other persistent atrial fibrillation: Secondary | ICD-10-CM | POA: Diagnosis present

## 2021-10-28 DIAGNOSIS — Z888 Allergy status to other drugs, medicaments and biological substances status: Secondary | ICD-10-CM

## 2021-10-28 DIAGNOSIS — Z91030 Bee allergy status: Secondary | ICD-10-CM

## 2021-10-28 DIAGNOSIS — E039 Hypothyroidism, unspecified: Secondary | ICD-10-CM | POA: Diagnosis present

## 2021-10-28 DIAGNOSIS — I82621 Acute embolism and thrombosis of deep veins of right upper extremity: Secondary | ICD-10-CM | POA: Diagnosis present

## 2021-10-28 DIAGNOSIS — M419 Scoliosis, unspecified: Secondary | ICD-10-CM | POA: Diagnosis present

## 2021-10-28 DIAGNOSIS — Z880 Allergy status to penicillin: Secondary | ICD-10-CM

## 2021-10-28 DIAGNOSIS — I700 Atherosclerosis of aorta: Secondary | ICD-10-CM | POA: Diagnosis present

## 2021-10-28 DIAGNOSIS — Z79899 Other long term (current) drug therapy: Secondary | ICD-10-CM

## 2021-10-28 DIAGNOSIS — I480 Paroxysmal atrial fibrillation: Secondary | ICD-10-CM | POA: Diagnosis present

## 2021-10-28 DIAGNOSIS — K219 Gastro-esophageal reflux disease without esophagitis: Secondary | ICD-10-CM | POA: Diagnosis present

## 2021-10-28 DIAGNOSIS — I272 Pulmonary hypertension, unspecified: Secondary | ICD-10-CM | POA: Diagnosis present

## 2021-10-28 DIAGNOSIS — L0390 Cellulitis, unspecified: Secondary | ICD-10-CM

## 2021-10-28 DIAGNOSIS — Z9049 Acquired absence of other specified parts of digestive tract: Secondary | ICD-10-CM

## 2021-10-28 DIAGNOSIS — I5082 Biventricular heart failure: Secondary | ICD-10-CM | POA: Diagnosis present

## 2021-10-28 DIAGNOSIS — I493 Ventricular premature depolarization: Secondary | ICD-10-CM | POA: Diagnosis present

## 2021-10-28 DIAGNOSIS — L97929 Non-pressure chronic ulcer of unspecified part of left lower leg with unspecified severity: Secondary | ICD-10-CM | POA: Diagnosis present

## 2021-10-28 DIAGNOSIS — R6521 Severe sepsis with septic shock: Secondary | ICD-10-CM | POA: Diagnosis present

## 2021-10-28 DIAGNOSIS — M109 Gout, unspecified: Secondary | ICD-10-CM | POA: Diagnosis present

## 2021-10-28 LAB — CBC WITH DIFFERENTIAL/PLATELET
Abs Immature Granulocytes: 0.22 10*3/uL — ABNORMAL HIGH (ref 0.00–0.07)
Basophils Absolute: 0.1 10*3/uL (ref 0.0–0.1)
Basophils Relative: 0 %
Eosinophils Absolute: 0.1 10*3/uL (ref 0.0–0.5)
Eosinophils Relative: 0 %
HCT: 36.6 % (ref 36.0–46.0)
Hemoglobin: 11.8 g/dL — ABNORMAL LOW (ref 12.0–15.0)
Immature Granulocytes: 1 %
Lymphocytes Relative: 3 %
Lymphs Abs: 0.7 10*3/uL (ref 0.7–4.0)
MCH: 28.6 pg (ref 26.0–34.0)
MCHC: 32.2 g/dL (ref 30.0–36.0)
MCV: 88.8 fL (ref 80.0–100.0)
Monocytes Absolute: 0.8 10*3/uL (ref 0.1–1.0)
Monocytes Relative: 3 %
Neutro Abs: 24 10*3/uL — ABNORMAL HIGH (ref 1.7–7.7)
Neutrophils Relative %: 93 %
Platelets: 192 10*3/uL (ref 150–400)
RBC: 4.12 MIL/uL (ref 3.87–5.11)
RDW: 17.2 % — ABNORMAL HIGH (ref 11.5–15.5)
WBC: 26 10*3/uL — ABNORMAL HIGH (ref 4.0–10.5)
nRBC: 0 % (ref 0.0–0.2)

## 2021-10-28 LAB — CBG MONITORING, ED: Glucose-Capillary: 111 mg/dL — ABNORMAL HIGH (ref 70–99)

## 2021-10-28 LAB — LACTIC ACID, PLASMA
Lactic Acid, Venous: 2.8 mmol/L (ref 0.5–1.9)
Lactic Acid, Venous: 2.4 mmol/L (ref 0.5–1.9)

## 2021-10-28 LAB — COMPREHENSIVE METABOLIC PANEL
ALT: 9 U/L (ref 0–44)
AST: 21 U/L (ref 15–41)
Albumin: 4 g/dL (ref 3.5–5.0)
Alkaline Phosphatase: 110 U/L (ref 38–126)
Anion gap: 15 (ref 5–15)
BUN: 89 mg/dL — ABNORMAL HIGH (ref 8–23)
CO2: 28 mmol/L (ref 22–32)
Calcium: 9.6 mg/dL (ref 8.9–10.3)
Chloride: 91 mmol/L — ABNORMAL LOW (ref 98–111)
Creatinine, Ser: 2.86 mg/dL — ABNORMAL HIGH (ref 0.44–1.00)
GFR, Estimated: 17 mL/min — ABNORMAL LOW (ref >60)
Glucose, Bld: 117 mg/dL — ABNORMAL HIGH (ref 70–99)
Potassium: 2.7 mmol/L — CL (ref 3.5–5.1)
Sodium: 134 mmol/L — ABNORMAL LOW (ref 135–145)
Total Bilirubin: 2.2 mg/dL — ABNORMAL HIGH (ref 0.3–1.2)
Total Protein: 7.8 g/dL (ref 6.5–8.1)

## 2021-10-28 LAB — I-STAT VENOUS BLOOD GAS, ED
Acid-Base Excess: 5 mmol/L — ABNORMAL HIGH (ref 0.0–2.0)
Bicarbonate: 30.5 mmol/L — ABNORMAL HIGH (ref 20.0–28.0)
Calcium, Ion: 1.13 mmol/L — ABNORMAL LOW (ref 1.15–1.40)
HCT: 43 % (ref 36.0–46.0)
Hemoglobin: 14.6 g/dL (ref 12.0–15.0)
O2 Saturation: 71 %
Patient temperature: 103.3
Potassium: 2.7 mmol/L — CL (ref 3.5–5.1)
Sodium: 135 mmol/L (ref 135–145)
TCO2: 32 mmol/L (ref 22–32)
pCO2, Ven: 50.7 mmHg (ref 44–60)
pH, Ven: 7.398 (ref 7.25–7.43)
pO2, Ven: 44 mmHg (ref 32–45)

## 2021-10-28 LAB — URINALYSIS, ROUTINE W REFLEX MICROSCOPIC
Bilirubin Urine: NEGATIVE
Glucose, UA: NEGATIVE mg/dL
Hgb urine dipstick: NEGATIVE
Ketones, ur: NEGATIVE mg/dL
Leukocytes,Ua: NEGATIVE
Nitrite: NEGATIVE
Protein, ur: NEGATIVE mg/dL
Specific Gravity, Urine: 1.02 (ref 1.005–1.030)
pH: 5 (ref 5.0–8.0)

## 2021-10-28 LAB — MAGNESIUM: Magnesium: 2.4 mg/dL (ref 1.7–2.4)

## 2021-10-28 LAB — PROTIME-INR
INR: 1.2 (ref 0.8–1.2)
Prothrombin Time: 14.8 seconds (ref 11.4–15.2)

## 2021-10-28 LAB — APTT: aPTT: 36 seconds (ref 24–36)

## 2021-10-28 LAB — TROPONIN I (HIGH SENSITIVITY): Troponin I (High Sensitivity): 53 ng/L — ABNORMAL HIGH (ref <18)

## 2021-10-28 LAB — BRAIN NATRIURETIC PEPTIDE: B Natriuretic Peptide: 276.7 pg/mL — ABNORMAL HIGH (ref 0.0–100.0)

## 2021-10-28 LAB — SARS CORONAVIRUS 2 BY RT PCR: SARS Coronavirus 2 by RT PCR: NEGATIVE

## 2021-10-28 LAB — AMMONIA: Ammonia: 38 umol/L — ABNORMAL HIGH (ref 9–35)

## 2021-10-28 MED ORDER — VANCOMYCIN VARIABLE DOSE PER UNSTABLE RENAL FUNCTION (PHARMACIST DOSING)
Status: DC
  Filled 2021-10-28: qty 1

## 2021-10-28 MED ORDER — ACETAMINOPHEN 650 MG RE SUPP
650.0000 mg | Freq: Once | RECTAL | Status: AC
Start: 2021-10-28 — End: 2021-10-28
  Administered 2021-10-28: 650 mg via RECTAL

## 2021-10-28 MED ORDER — POTASSIUM CHLORIDE 10 MEQ/100ML IV SOLN
10.0000 meq | INTRAVENOUS | Status: AC
  Administered 2021-10-28 – 2021-10-29 (×4): 10 meq via INTRAVENOUS
  Filled 2021-10-28 (×4): qty 100

## 2021-10-28 MED ORDER — VANCOMYCIN HCL IN DEXTROSE 1-5 GM/200ML-% IV SOLN
1000.0000 mg | Freq: Once | INTRAVENOUS | Status: AC
  Administered 2021-10-28: 1000 mg via INTRAVENOUS
  Filled 2021-10-28: qty 200

## 2021-10-28 MED ORDER — METRONIDAZOLE 500 MG/100ML IV SOLN
500.0000 mg | Freq: Once | INTRAVENOUS | Status: AC
  Administered 2021-10-28: 500 mg via INTRAVENOUS
  Filled 2021-10-28: qty 100

## 2021-10-28 MED ORDER — SODIUM CHLORIDE 0.9 % IV SOLN
2.0000 g | Freq: Once | INTRAVENOUS | Status: AC
  Administered 2021-10-28: 2 g via INTRAVENOUS
  Filled 2021-10-28: qty 12.5

## 2021-10-28 MED ORDER — VANCOMYCIN HCL IN DEXTROSE 1-5 GM/200ML-% IV SOLN: 1000.0000 mg | Freq: Once | INTRAVENOUS | Status: DC

## 2021-10-28 MED ORDER — SODIUM CHLORIDE 0.9 % IV SOLN: 2.0000 g | INTRAVENOUS | Status: DC

## 2021-10-28 MED ORDER — TECHNETIUM TC 99M PYROPHOSPHATE
21.7000 | Freq: Once | INTRAVENOUS | Status: AC | PRN
  Administered 2021-10-28: 21.7 via INTRAVENOUS

## 2021-10-28 MED ORDER — LACTATED RINGERS IV BOLUS (SEPSIS)
1000.0000 mL | Freq: Once | INTRAVENOUS | Status: AC
  Administered 2021-10-28: 1000 mL via INTRAVENOUS

## 2021-10-28 MED ORDER — NOREPINEPHRINE 4 MG/250ML-% IV SOLN
0.0000 ug/min | INTRAVENOUS | Status: DC
  Administered 2021-10-28: 2 ug/min via INTRAVENOUS
  Filled 2021-10-28: qty 250

## 2021-10-28 MED ORDER — POTASSIUM CHLORIDE 10 MEQ/100ML IV SOLN
10.0000 meq | INTRAVENOUS | Status: DC
  Administered 2021-10-28: 10 meq via INTRAVENOUS
  Filled 2021-10-28: qty 100

## 2021-10-28 MED ORDER — LACTATED RINGERS IV SOLN: INTRAVENOUS | Status: DC

## 2021-10-28 MED ORDER — ACETAMINOPHEN 650 MG RE SUPP
RECTAL | Status: AC
  Filled 2021-10-28: qty 1

## 2021-10-28 MED ORDER — SODIUM CHLORIDE 0.9 % IV BOLUS
500.0000 mL | Freq: Once | INTRAVENOUS | Status: AC
  Administered 2021-10-28: 500 mL via INTRAVENOUS

## 2021-10-28 MED ORDER — VANCOMYCIN HCL IN DEXTROSE 1-5 GM/200ML-% IV SOLN
1000.0000 mg | Freq: Once | INTRAVENOUS | Status: AC
Start: 2021-10-28 — End: 2021-10-28
  Administered 2021-10-28: 1000 mg via INTRAVENOUS
  Filled 2021-10-28: qty 200

## 2021-10-28 NOTE — ED Notes (Signed)
EDP at bedside  

## 2021-10-28 NOTE — ED Provider Notes (Signed)
[facility] Provider Note   CSN: [id number] Arrival date & time: 10/28/21  1859     History  Chief Complaint  Patient presents with   Altered Mental Status    Kristine [name] is a 76 y.o. female with a past medical history of permanent atrial fibrillation not on anticoagulation after episodes of severe hematoma and bleeding from the mouth.  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  History is given at bedside by patient's husband daughter and other family member.  Patient's husband reports that when they came back from her nuclear medicine scan she was acting as if she was extremely weak and tired and could barely make it out of the car into the house he attributed this to her staying up all night and worrying about the test.  She went to bed at 2 PM when they got home around 4:00 yelled out for him from her bedroom stating that she was freezing.  He found her shivering.  He brought her a warm blanket and turned the heat up in the house.  Around 5 PM he went in to reassess her and she was altered, confused, breathing rapidly.  He called his daughter to come over who took her oral temperature with her mouth open it read 101.  They were able to give her some Tylenol.  I state that she has a few sores on her feet which have been well-healing but slow due to her diabetes as well as a bandage on her left knee for a wound that is also slow to heal.  She had a hematoma of the left knee that had to be opened and drained.  Upon arrival patient found to be altered, febrile, hypoxic with oxygen saturations in the 70s now on 4 L via nasal cannula   Altered Mental Status      Home Medications Prior to Admission medications   Medication Sig Start Date End Date Taking? Authorizing Provider  acetaminophen  (TYLENOL) 500 MG tablet Take 1,000 mg by mouth every 6 (six) hours as needed for moderate pain or headache.    [provider]  allopurinol (ZYLOPRIM) 300 MG tablet Take 300 mg by mouth in the morning.    [provider]  ALPRAZolam Duanne Moron) 0.5 MG tablet Take 0.5 mg by mouth every 6 (six) hours as needed for anxiety.    [provider]  apixaban (ELIQUIS) 2.5 MG TABS tablet Take 1 tablet (2.5 mg total) by mouth 2 (two) times daily. 10/15/21   Alla Feeling, NP  BD PEN NEEDLE NANO U/F 32G X 4 MM MISC Inject 1 each as directed See admin instructions. Use pen needles with insulin pens daily 12/24/14   [provider]  colchicine 0.6 MG tablet Take 0.6 mg by mouth daily as needed (gout flares). 04/07/21   [provider]  diclofenac Sodium (VOLTAREN) 1 % GEL Apply 4 g topically 4 (four) times daily as needed (back pain). 09/19/21   Barton Dubois, MD  docusate sodium (COLACE) 100 MG capsule Take 100 mg by mouth daily as needed for mild constipation.    [provider]  DULoxetine (CYMBALTA) 30 MG capsule Take 30 mg by mouth in the morning. 09/26/20   [provider]  estradiol (ESTRACE) 0.1 MG/GM vaginal cream Place 1 Applicatorful vaginally as needed (  vaginal irritation). 12/18/16   [provider]  furosemide (LASIX) 80 MG tablet TAKE 1 AND 1/2 TABLETS BY MOUTH TWICE DAILY 10/06/21   Bensimhon, Shaune Pascal, MD  HYDROcodone-acetaminophen (NORCO/VICODIN) 5-325 MG tablet Take 1 tablet by mouth every 6 (six) hours as needed for severe pain. 04/09/21   Etta Quill, NP  insulin aspart (NOVOLOG FLEXPEN) 100 UNIT/ML FlexPen 40-70 Units See admin instructions. Less than 150 units 150=40 units151-199=45 units 200-249=50 units 250-299=55 units 300-349=60 units    [provider]  Insulin Degludec (TRESIBA FLEXTOUCH) 200 UNIT/ML SOPN Inject 100 Units into the skin in the morning and at bedtime.    [provider]  levothyroxine  (SYNTHROID) 88 MCG tablet Take 88 mcg by mouth every morning. 08/14/21   [provider]  lidocaine-prilocaine (EMLA) cream 1 gram qid prn 01/07/21   Marcial Pacas, MD  LYRICA 50 MG capsule Take 50 mg by mouth 2 (two) times daily. 06/01/17   [provider]  metolazone (ZAROXOLYN) 2.5 MG tablet Take 1 tablet (2.5 mg total) by mouth 3 (three) times a week. Mon, Wed, Fri. 09/22/21   Barton Dubois, MD  metoprolol succinate (TOPROL-XL) 50 MG 24 hr tablet Take 1/2 tablet (25 mg total) by mouth daily. 09/19/21   Barton Dubois, MD  nystatin (MYCOSTATIN/NYSTOP) powder Apply 1 application topically as needed (skin irritation/rash). 01/13/19   [provider]  ondansetron (ZOFRAN) 4 MG tablet Take 1 tablet (4 mg total) by mouth every 8 (eight) hours as needed for nausea or vomiting. 05/21/21   Scheeler, Carola Rhine, PA-C  Polyethyl Glycol-Propyl Glycol (SYSTANE OP) Place 1 drop into both eyes 2 (two) times daily as needed (dry eyes).    [provider]  polyethylene glycol (MIRALAX / GLYCOLAX) 17 g packet Take 17 g by mouth daily as needed (constipation.).    [provider]  potassium chloride SA (KLOR-CON M) 20 MEQ tablet Take 3 tablets (60 mEq total) by mouth every morning AND 2 tablets (40 mEq total) every evening. 10/03/21   Rosita Fire, Brittainy M, PA-C  REGRANEX 0.01 % gel Apply 1 Application topically daily. Apply to left foot 09/09/21   [provider]  traMADol (ULTRAM) 50 MG tablet Take 50 mg by mouth 3 (three) times daily as needed (severe neuropathy pain.). 06/24/19   [provider]  Vitamin D, Ergocalciferol, (DRISDOL) 50000 UNITS CAPS Take 50,000 Units by mouth every Monday.    [provider]      Allergies    Albuterol, Epinephrine, Penicillins, Ceftriaxone, Citalopram hydrobromide, Exenatide, Ketorolac tromethamine, Tizanidine, Torsemide, Zoledronic acid, Bee venom, Ivp dye [iodinated contrast media], and Keflex [cephalexin]    Review  of Systems   Review of Systems  Physical Exam Updated Vital Signs BP (!) 91/45   Pulse (!) 125   Temp (!) 101.9 F (38.8 C) (Rectal)   Resp 17   Ht '5\' 9"'$  (1.753 m)   Wt 132.5 kg   SpO2 100%   BMI 43.12 kg/m  Physical Exam Vitals reviewed.  Constitutional:      Appearance: She is ill-appearing and toxic-appearing.  HENT:     Head: Normocephalic and atraumatic.     Right Ear: Tympanic membrane normal.     Mouth/Throat:     Mouth: Mucous membranes are moist.  Eyes:     Extraocular Movements: Extraocular movements intact.     Pupils: Pupils are equal, round, and reactive to light.  Cardiovascular:     Rate and Rhythm: Tachycardia present. Rhythm regularly  irregular.  Pulmonary:     Effort: Tachypnea present.     Comments: Patient is groaning, breathing irregularly, holding her breath at times.  She is belching repeatedly Abdominal:     Comments: Obese abdomen without obvious distention.  Musculoskeletal:     Cervical back: Normal range of motion.  Skin:    Comments: Large pannus with intertrigo, no signs of cellulitis. Multiple small wounds on her toes that do not appear actively infected.  Wound over the left knee appears well-healing Petechial rash on the medial thigh     ED Results / Procedures / Treatments   Labs (all labs ordered are listed, but only abnormal results are displayed) Labs Reviewed  CBC WITH DIFFERENTIAL/PLATELET - Abnormal; Notable for the following components:      Result Value   WBC 26.0 (*)    Hemoglobin 11.8 (*)    RDW 17.2 (*)    All other components within normal limits  CBG MONITORING, ED - Abnormal; Notable for the following components:   Glucose-Capillary 111 (*)    All other components within normal limits  I-STAT VENOUS BLOOD GAS, ED - Abnormal; Notable for the following components:   Bicarbonate 30.5 (*)    Acid-Base Excess 5.0 (*)    Potassium 2.7 (*)    Calcium, Ion 1.13 (*)    All other components within normal limits   CULTURE, BLOOD (ROUTINE X 2)  CULTURE, BLOOD (ROUTINE X 2)  SARS CORONAVIRUS 2 BY RT PCR  PROTIME-INR  COMPREHENSIVE METABOLIC PANEL  LACTIC ACID, PLASMA  LACTIC ACID, PLASMA  URINALYSIS, ROUTINE W REFLEX MICROSCOPIC  MAGNESIUM  BRAIN NATRIURETIC PEPTIDE  APTT  TROPONIN I (HIGH SENSITIVITY)    EKG None  Radiology NM CARDIAC AMYLOID TUMOR LOC INFLAM SPECT 1 DAY  Result Date: 10/28/2021 CLINICAL DATA:  HEART FAILURE. CONCERN FOR CARDIAC AMYLOIDOSIS. EXAM: NUCLEAR MEDICINE TUMOR LOCALIZATION. PYP CARDIAC AMYLOIDOSIS SCAN WITH SPECT TECHNIQUE: Following intravenous administration of radiopharmaceutical, anterior planar images of the chest were obtained. Regions of interest were placed on the heart and contralateral chest wall for quantitative assessment. Additional SPECT imaging of the chest could not be obtained due to thoracic deformity, body habitus and patient inability to lie flat or still due to back pain. RADIOPHARMACEUTICALS:  21.7 mCi TC- 49mPYROPHOSPHATE IV FINDINGS: Planar Visual assessment: Anterior planar imaging demonstrates radiotracer uptake within the heart less than uptake within the adjacent ribs (Grade 1). Quantitative assessment : Quantitative assessment of the cardiac uptake compared to the contralateral chest wall is equal to 1.05 (H/CL = 1.05). SPECT assessment: Unable to be performed, as above IMPRESSION: Visual and quantitative assessment (grade 1, H/CL = 1.05) is equivocally suggestive of transthyretin amyloidosis. Electronically Signed   By: MLavonia DanaM.D.   On: 10/28/2021 13:35    Procedures .Critical Care  Performed by: HMargarita Mail PA-C Authorized by: HMargarita Mail PA-C   Critical care provider statement:    Critical care time (minutes):  65   Critical care time was exclusive of:  Separately billable procedures and treating other patients   Critical care was necessary to treat or prevent imminent or life-threatening deterioration of the following  conditions:  Shock, sepsis, respiratory failure, metabolic crisis and CNS failure or compromise   Critical care was time spent personally by me on the following activities:  Development of treatment plan with patient or surrogate, discussions with consultants, evaluation of patient's response to treatment, examination of patient, ordering and review of laboratory studies, ordering and review of radiographic  studies, ordering and performing treatments and interventions, pulse oximetry, re-evaluation of patient's condition and review of old charts     Medications Ordered in ED Medications  acetaminophen (TYLENOL) 650 MG suppository (has no administration in time range)  acetaminophen (TYLENOL) suppository 650 mg (650 mg Rectal Given 10/28/21 1931)    ED Course/ Medical Decision Making/ A&P Clinical Course as of 10/28/21 2348  Tue Oct 28, 2021  1937 Potassium(!!): 2.7 [AH]  2002 WBC(!): 26.0 [AH]  4181 76 year old female who is altered and appears critically ill.  Her initial blood pressures appeared soft but are improving without fluid resuscitation.  Given her fragile CHF history we will hold off on fluids at this time.  She has baseline CKD stage IV and an allergy to contrast.  Although I have considered a pulmonary embolus in the setting of fever and acute hypoxia I do not think the patient can receive this test at this time.  She is at white blood cell count of 26,000 and otherwise appears to be septic.  She is receiving broad spectrum antibiotics.  Awaiting full labs. [AH]  2015 Lactic Acid, Venous(!!): 2.8 [AH]  2016 Potassium(!!): 2.7 Patient with lactate of 2.8.  500 mL of fluid.  Have also ordered 5 runs of 10 mEq potassium. [AH]  2016 Creatinine(!): 2.86 Creatinine appears to be at baseline [AH]  2118 CT CHEST ABDOMEN PELVIS WO CONTRAST [AH]  2347 Urinalysis, Routine w reflex microscopic Urine, In & Out Cath [AH]  2347 Troponin I (High Sensitivity)(!) [AH]  2347 Lactic acid,  plasma(!!) Lactic acid went from 2.8-2.4, not much movement [AH]  2348 SARS Coronavirus 2 by RT PCR (hospital order, performed in [place] hospital lab) *cepheid single result test* Anterior Nasal Swab [AH]    Clinical Course User Index [AH] Margarita Mail, PA-C                           Medical Decision Making Patient arrives febrile, hypoxic and altered. The differential diagnosis for AMS is extensive and includes, but is not limited to: drug overdose - opioids, alcohol, sedatives, antipsychotics, drug withdrawal, others; Metabolic: hypoxia, hypoglycemia, hyperglycemia, hypercalcemia, hypernatremia, hyponatremia, uremia, hepatic encephalopathy, hypothyroidism, hyperthyroidism, vitamin B12 or thiamine deficiency, carbon monoxide poisoning, Wilson's disease, Lactic acidosis, DKA/HHOS; Infectious: meningitis, encephalitis, bacteremia/sepsis, urinary tract infection, pneumonia, neurosyphilis; Structural: Space-occupying lesion, (brain tumor, subdural hematoma, hydrocephalus,); Vascular: stroke, subarachnoid hemorrhage, coronary ischemia, hypertensive encephalopathy, CNS vasculitis, thrombotic thrombocytopenic purpura, disseminated intravascular coagulation, hyperviscosity; Psychiatric: Schizophrenia, depression; Other: Seizure, hypothermia, heat stroke, ICU psychosis, dementia -"sundowning."  After review of all data points patient appears to be septic.  Unknown source at this time.  Patient also exhibiting evidence of shock with persistent hypotension, elevated lactic acid with minimal budge in lactic acidosis after fluid volume resuscitation.  Patient started on pressors after her blood pressure dropped to map of 64.  Unfortunately her blood pressures were too soft to treat her A-fib with RVR however her pulse rate seems to be improving now 93 after fluid resuscitation.  I do not see any evidence of osteomyelitis on bilateral feet or knee x-ray.  I personally ordered and visualized as well as  interpreted these films.  I also and ordered and interpreted and visualized CT chest abdomen pelvis without contrast.  She does not appear to have any overt findings of obstruction or edema in her chest.  CT head shows no evidence of acute infarct or bleeding.  I personally visualized and interpreted  CT head as well. Mild hyperammonemia, troponin elevated and likely due to demand ischemia in setting of shock and tachycardia.  Patient with marked hyponatremia.  She is receiving multiple rounds of IV potassium.  After all of these interventions patient has not had improvement in both her stability as well as her mental status.  She is less responsive and her blood pressures have softened and she was placed on pressors. I discussed the case with Dr. Jennette Kettle who recommended ICU admission.  I also discussed the case with Dr. Patsey Berthold of ICU.  Discussed patient's DNR/DNI status however would want other aggressive measures such as IV pressors and BiPAP.  She is excepted to the ICU service.  Patient has been critically ill requiring multiple reevaluations.  Seen and shared visit with Dr. Rogene Houston.  Cardiac monitoring reveals atrial fibrillation with rapid ventricular response (Rate & rhythm), as reviewed and interpreted by me. Cardiac monitoring was ordered due to critical illness, tachycardia and to monitor patient for dysrhythmia.    Amount and/or Complexity of Data Reviewed Labs: ordered. Decision-making details documented in ED Course. Radiology: ordered and independent interpretation performed. Decision-making details documented in ED Course. ECG/medicine tests: ordered and independent interpretation performed.  Risk OTC drugs. Prescription drug management. Decision regarding hospitalization.      Final Clinical Impression(s) / ED Diagnoses Final diagnoses:  Sepsis with encephalopathy and septic shock, due to unspecified organism ([place])  DNR (do not resuscitate)  Hypokalemia  Acute  respiratory failure with hypoxia ([place])  Atrial fibrillation with rapid ventricular response [facility])    Rx / DC Orders ED Discharge Orders     None         Margarita Mail, PA-C 10/29/21 0000    Fredia Sorrow, MD 10/31/21 1124

## 2021-10-28 NOTE — Telephone Encounter (Signed)
   Pt husband called. Pt had NUCLEAR MEDICINE TUMOR LOCALIZATION. PYP CARDIAC AMYLOIDOSIS SCAN WITH SPECT today.  She initially tolerated there procedure well, but then developed a temp of 100.5 and is having chills. He also describes LOC changes and says he's having trouble keeping her awake.  He was going to drive her to the hospital, he mentioned [facility].  Strongly recommended that he not try to drive her and call [phone]. He said he would.   Rosaria Ferries, PA-C 10/28/2021 6:21 PM

## 2021-10-28 NOTE — Progress Notes (Signed)
Pharmacy Antibiotic Note  Kristine [name] is a 76 y.o. female for which pharmacy has been consulted for cefepime and vancomycin dosing for sepsis.  Patient with a history of AF not on AC 2/2 severe hematoma and bleeding from the mouth, DM, HF. Patient presenting with fever and chills and LOC following nuclear medicine scan today. Recently underwent drainage of left knee for hematoma with drain placent in March 2023. Also with recent ED visit for LL leg bleeding.  SCr 2.86 - baseline 2.2-2.4 WBC 26; T 100.4 F; HR 98; RR 18  Plan: Metronidazole per MD Cefepime 2g q24hr Vancomycin 2000 mg once, subsequent dosing as indicated per random vancomycin level until renal function stable and/or improved, at which time scheduled dosing can be considered Trend WBC, Fever, Renal function, & Clinical course F/u cultures, clinical course, WBC, fever De-escalate when able  Height: '5\' 9"'$  (175.3 cm) Weight: 132.5 kg (292 lb) IBW/kg (Calculated) : 66.2  Temp (24hrs), Avg:102.4 F (39.1 C), Min:101.9 F (38.8 C), Max:103.3 F (39.6 C)  Recent Labs  Lab 10/28/21 1920  WBC 26.0*  CREATININE 2.86*  LATICACIDVEN 2.8*    Estimated Creatinine Clearance: 25.3 mL/min (A) (by C-G formula based on SCr of 2.86 mg/dL (H)).    Allergies  Allergen Reactions   Albuterol Other (See Comments)    REACTION: Tachycardia- AFib   Epinephrine Other (See Comments)    Increases heart rate per patient.    Penicillins Other (See Comments)    REACTION: flushing' \\T'$ \ hot  Did it involve swelling of the face/tongue/throat, SOB, or low BP? No Did it involve sudden or severe rash/hives, skin peeling, or any reaction on the inside of your mouth or nose? No Did you need to seek medical attention at a hospital or doctor's office? No When did it last happen?    20 years ago   If all above answers are "NO", may proceed with cephalosporin use.   Ceftriaxone Other (See Comments)    Sweats   Citalopram Hydrobromide Other  (See Comments)    Prolonged QT prolongation   Exenatide Other (See Comments)    Unknown reaction   Ketorolac Tromethamine Hives and Swelling    swelling in hands   Tizanidine Other (See Comments)    lethargic   Torsemide Swelling   Zoledronic Acid Other (See Comments)    Pt was hospitalized due to medication   Bee Venom Other (See Comments)    "makes me nervous"   Ivp Dye [Iodinated Contrast Media] Other (See Comments)    flushing   Keflex [Cephalexin] Other (See Comments)    Makes her feel warm; she was told never to take it.    Antimicrobials this admission: cefepime 8/8 >>  vancomycin 8/8 >>  Microbiology results: Pending  Thank you for allowing pharmacy to be a part of this patient's care.  Lorelei Pont, PharmD, BCPS 10/28/2021 8:16 PM ED Clinical Pharmacist -  [phone]

## 2021-10-28 NOTE — ED Notes (Signed)
Radiology tech at bedside for portable Xray.

## 2021-10-28 NOTE — ED Notes (Signed)
This RN returned to room with patient from CT and Xray.

## 2021-10-28 NOTE — Progress Notes (Signed)
Patient's initial SPO2 was 78%.  RN place patient on 4 liter nasal cannula.  Patient's SPO2 increased to 100%.  VBG collected while patient was on 4 liter nasal cannula.  Patient's SPO2 is 98% on 3 liter nasal cannula.  RT will continue to monitor.

## 2021-10-28 NOTE — Progress Notes (Signed)
VBG collected while patient was on 4 liter nasal cannula.

## 2021-10-28 NOTE — ED Triage Notes (Addendum)
Pt brought in by family with complaints of fever and altered mental status. Incontinent of urine while staff attempting to get out of vehicle Family report cardiac procedure nuclear study today Pt warm to touch, responds to name being called. Able to stand with assist x 3 max assistance. Groaning, belching

## 2021-10-29 ENCOUNTER — Inpatient Hospital Stay (HOSPITAL_COMMUNITY): Payer: Medicare HMO

## 2021-10-29 DIAGNOSIS — D689 Coagulation defect, unspecified: Secondary | ICD-10-CM | POA: Diagnosis present

## 2021-10-29 DIAGNOSIS — L0390 Cellulitis, unspecified: Secondary | ICD-10-CM | POA: Diagnosis not present

## 2021-10-29 DIAGNOSIS — I43 Cardiomyopathy in diseases classified elsewhere: Secondary | ICD-10-CM | POA: Diagnosis present

## 2021-10-29 DIAGNOSIS — I4891 Unspecified atrial fibrillation: Secondary | ICD-10-CM

## 2021-10-29 DIAGNOSIS — R600 Localized edema: Secondary | ICD-10-CM | POA: Diagnosis not present

## 2021-10-29 DIAGNOSIS — A419 Sepsis, unspecified organism: Secondary | ICD-10-CM

## 2021-10-29 DIAGNOSIS — R6521 Severe sepsis with septic shock: Secondary | ICD-10-CM | POA: Diagnosis present

## 2021-10-29 DIAGNOSIS — I5033 Acute on chronic diastolic (congestive) heart failure: Secondary | ICD-10-CM | POA: Diagnosis not present

## 2021-10-29 DIAGNOSIS — E1122 Type 2 diabetes mellitus with diabetic chronic kidney disease: Secondary | ICD-10-CM | POA: Diagnosis not present

## 2021-10-29 DIAGNOSIS — E1142 Type 2 diabetes mellitus with diabetic polyneuropathy: Secondary | ICD-10-CM | POA: Diagnosis not present

## 2021-10-29 DIAGNOSIS — L97919 Non-pressure chronic ulcer of unspecified part of right lower leg with unspecified severity: Secondary | ICD-10-CM | POA: Diagnosis present

## 2021-10-29 DIAGNOSIS — I82621 Acute embolism and thrombosis of deep veins of right upper extremity: Secondary | ICD-10-CM | POA: Diagnosis present

## 2021-10-29 DIAGNOSIS — R7881 Bacteremia: Secondary | ICD-10-CM | POA: Diagnosis not present

## 2021-10-29 DIAGNOSIS — Z20822 Contact with and (suspected) exposure to covid-19: Secondary | ICD-10-CM | POA: Diagnosis present

## 2021-10-29 DIAGNOSIS — N184 Chronic kidney disease, stage 4 (severe): Secondary | ICD-10-CM | POA: Diagnosis present

## 2021-10-29 DIAGNOSIS — I480 Paroxysmal atrial fibrillation: Secondary | ICD-10-CM | POA: Diagnosis not present

## 2021-10-29 DIAGNOSIS — I50810 Right heart failure, unspecified: Secondary | ICD-10-CM | POA: Diagnosis not present

## 2021-10-29 DIAGNOSIS — J9601 Acute respiratory failure with hypoxia: Secondary | ICD-10-CM | POA: Diagnosis present

## 2021-10-29 DIAGNOSIS — I071 Rheumatic tricuspid insufficiency: Secondary | ICD-10-CM | POA: Diagnosis present

## 2021-10-29 DIAGNOSIS — E876 Hypokalemia: Secondary | ICD-10-CM

## 2021-10-29 DIAGNOSIS — A401 Sepsis due to streptococcus, group B: Secondary | ICD-10-CM | POA: Diagnosis present

## 2021-10-29 DIAGNOSIS — J449 Chronic obstructive pulmonary disease, unspecified: Secondary | ICD-10-CM | POA: Diagnosis present

## 2021-10-29 DIAGNOSIS — F32A Depression, unspecified: Secondary | ICD-10-CM | POA: Diagnosis present

## 2021-10-29 DIAGNOSIS — I38 Endocarditis, valve unspecified: Secondary | ICD-10-CM | POA: Diagnosis not present

## 2021-10-29 DIAGNOSIS — G9340 Encephalopathy, unspecified: Secondary | ICD-10-CM

## 2021-10-29 DIAGNOSIS — E039 Hypothyroidism, unspecified: Secondary | ICD-10-CM | POA: Diagnosis present

## 2021-10-29 DIAGNOSIS — R609 Edema, unspecified: Secondary | ICD-10-CM | POA: Diagnosis not present

## 2021-10-29 DIAGNOSIS — I130 Hypertensive heart and chronic kidney disease with heart failure and stage 1 through stage 4 chronic kidney disease, or unspecified chronic kidney disease: Secondary | ICD-10-CM | POA: Diagnosis present

## 2021-10-29 DIAGNOSIS — N179 Acute kidney failure, unspecified: Secondary | ICD-10-CM | POA: Diagnosis present

## 2021-10-29 DIAGNOSIS — G9341 Metabolic encephalopathy: Secondary | ICD-10-CM | POA: Diagnosis present

## 2021-10-29 DIAGNOSIS — E11649 Type 2 diabetes mellitus with hypoglycemia without coma: Secondary | ICD-10-CM | POA: Diagnosis not present

## 2021-10-29 DIAGNOSIS — I5043 Acute on chronic combined systolic (congestive) and diastolic (congestive) heart failure: Secondary | ICD-10-CM | POA: Diagnosis present

## 2021-10-29 DIAGNOSIS — R4182 Altered mental status, unspecified: Secondary | ICD-10-CM | POA: Diagnosis present

## 2021-10-29 DIAGNOSIS — E854 Organ-limited amyloidosis: Secondary | ICD-10-CM | POA: Diagnosis present

## 2021-10-29 DIAGNOSIS — E662 Morbid (severe) obesity with alveolar hypoventilation: Secondary | ICD-10-CM | POA: Diagnosis present

## 2021-10-29 DIAGNOSIS — B951 Streptococcus, group B, as the cause of diseases classified elsewhere: Secondary | ICD-10-CM | POA: Diagnosis not present

## 2021-10-29 DIAGNOSIS — Z66 Do not resuscitate: Secondary | ICD-10-CM | POA: Diagnosis present

## 2021-10-29 DIAGNOSIS — K7460 Unspecified cirrhosis of liver: Secondary | ICD-10-CM | POA: Diagnosis present

## 2021-10-29 DIAGNOSIS — I700 Atherosclerosis of aorta: Secondary | ICD-10-CM | POA: Diagnosis present

## 2021-10-29 DIAGNOSIS — I10 Essential (primary) hypertension: Secondary | ICD-10-CM | POA: Diagnosis not present

## 2021-10-29 DIAGNOSIS — L8990 Pressure ulcer of unspecified site, unspecified stage: Secondary | ICD-10-CM | POA: Insufficient documentation

## 2021-10-29 DIAGNOSIS — I272 Pulmonary hypertension, unspecified: Secondary | ICD-10-CM | POA: Diagnosis present

## 2021-10-29 LAB — BLOOD CULTURE ID PANEL (REFLEXED) - BCID2

## 2021-10-29 LAB — TSH: TSH: 2.149 u[IU]/mL (ref 0.350–4.500)

## 2021-10-29 LAB — CBC WITH DIFFERENTIAL/PLATELET
Abs Immature Granulocytes: 0.66 10*3/uL — ABNORMAL HIGH (ref 0.00–0.07)
Basophils Absolute: 0.1 10*3/uL (ref 0.0–0.1)
Basophils Relative: 0 %
Eosinophils Absolute: 0 10*3/uL (ref 0.0–0.5)
Eosinophils Relative: 0 %
HCT: 29 % — ABNORMAL LOW (ref 36.0–46.0)
Hemoglobin: 9.3 g/dL — ABNORMAL LOW (ref 12.0–15.0)
Immature Granulocytes: 2 %
Lymphocytes Relative: 2 %
Lymphs Abs: 0.7 10*3/uL (ref 0.7–4.0)
MCH: 28.7 pg (ref 26.0–34.0)
MCHC: 32.1 g/dL (ref 30.0–36.0)
MCV: 89.5 fL (ref 80.0–100.0)
Monocytes Absolute: 1.8 10*3/uL — ABNORMAL HIGH (ref 0.1–1.0)
Monocytes Relative: 4 %
Neutro Abs: 37.3 10*3/uL — ABNORMAL HIGH (ref 1.7–7.7)
Neutrophils Relative %: 92 %
Platelets: 138 10*3/uL — ABNORMAL LOW (ref 150–400)
RBC: 3.24 MIL/uL — ABNORMAL LOW (ref 3.87–5.11)
RDW: 17.2 % — ABNORMAL HIGH (ref 11.5–15.5)
WBC: 40.6 10*3/uL — ABNORMAL HIGH (ref 4.0–10.5)
nRBC: 0 % (ref 0.0–0.2)

## 2021-10-29 LAB — COMPREHENSIVE METABOLIC PANEL
ALT: 9 U/L (ref 0–44)
AST: 16 U/L (ref 15–41)
Albumin: 2.9 g/dL — ABNORMAL LOW (ref 3.5–5.0)
Alkaline Phosphatase: 76 U/L (ref 38–126)
Anion gap: 14 (ref 5–15)
BUN: 85 mg/dL — ABNORMAL HIGH (ref 8–23)
CO2: 27 mmol/L (ref 22–32)
Calcium: 8.8 mg/dL — ABNORMAL LOW (ref 8.9–10.3)
Chloride: 93 mmol/L — ABNORMAL LOW (ref 98–111)
Creatinine, Ser: 2.87 mg/dL — ABNORMAL HIGH (ref 0.44–1.00)
GFR, Estimated: 17 mL/min — ABNORMAL LOW (ref >60)
Glucose, Bld: 82 mg/dL (ref 70–99)
Potassium: 3 mmol/L — ABNORMAL LOW (ref 3.5–5.1)
Sodium: 134 mmol/L — ABNORMAL LOW (ref 135–145)
Total Bilirubin: 2.1 mg/dL — ABNORMAL HIGH (ref 0.3–1.2)
Total Protein: 5.7 g/dL — ABNORMAL LOW (ref 6.5–8.1)

## 2021-10-29 LAB — BASIC METABOLIC PANEL
Anion gap: 13 (ref 5–15)
Anion gap: 17 — ABNORMAL HIGH (ref 5–15)
BUN: 86 mg/dL — ABNORMAL HIGH (ref 8–23)
BUN: 88 mg/dL — ABNORMAL HIGH (ref 8–23)
CO2: 26 mmol/L (ref 22–32)
CO2: 22 mmol/L (ref 22–32)
Calcium: 8.9 mg/dL (ref 8.9–10.3)
Calcium: 9 mg/dL (ref 8.9–10.3)
Chloride: 95 mmol/L — ABNORMAL LOW (ref 98–111)
Chloride: 93 mmol/L — ABNORMAL LOW (ref 98–111)
Creatinine, Ser: 2.91 mg/dL — ABNORMAL HIGH (ref 0.44–1.00)
Creatinine, Ser: 2.8 mg/dL — ABNORMAL HIGH (ref 0.44–1.00)
GFR, Estimated: 16 mL/min — ABNORMAL LOW (ref >60)
GFR, Estimated: 17 mL/min — ABNORMAL LOW (ref >60)
Glucose, Bld: 94 mg/dL (ref 70–99)
Glucose, Bld: 145 mg/dL — ABNORMAL HIGH (ref 70–99)
Potassium: 3.1 mmol/L — ABNORMAL LOW (ref 3.5–5.1)
Potassium: 4.1 mmol/L (ref 3.5–5.1)
Sodium: 134 mmol/L — ABNORMAL LOW (ref 135–145)
Sodium: 132 mmol/L — ABNORMAL LOW (ref 135–145)

## 2021-10-29 LAB — ABO/RH: ABO/RH(D): A NEG

## 2021-10-29 LAB — CBC
HCT: 30 % — ABNORMAL LOW (ref 36.0–46.0)
Hemoglobin: 10 g/dL — ABNORMAL LOW (ref 12.0–15.0)
MCH: 29.3 pg (ref 26.0–34.0)
MCHC: 33.3 g/dL (ref 30.0–36.0)
MCV: 88 fL (ref 80.0–100.0)
Platelets: 142 10*3/uL — ABNORMAL LOW (ref 150–400)
RBC: 3.41 MIL/uL — ABNORMAL LOW (ref 3.87–5.11)
RDW: 16.8 % — ABNORMAL HIGH (ref 11.5–15.5)
WBC: 43.8 10*3/uL — ABNORMAL HIGH (ref 4.0–10.5)
nRBC: 0 % (ref 0.0–0.2)

## 2021-10-29 LAB — BLOOD GAS, VENOUS
Acid-Base Excess: 4.1 mmol/L — ABNORMAL HIGH (ref 0.0–2.0)
Bicarbonate: 27.3 mmol/L (ref 20.0–28.0)
Drawn by: 1390
O2 Saturation: 93.2 %
Patient temperature: 36.6
pCO2, Ven: 34 mmHg — ABNORMAL LOW (ref 44–60)
pH, Ven: 7.51 — ABNORMAL HIGH (ref 7.25–7.43)
pO2, Ven: 60 mmHg — ABNORMAL HIGH (ref 32–45)

## 2021-10-29 LAB — MAGNESIUM: Magnesium: 2.3 mg/dL (ref 1.7–2.4)

## 2021-10-29 LAB — MRSA NEXT GEN BY PCR, NASAL: MRSA by PCR Next Gen: NOT DETECTED

## 2021-10-29 LAB — GLUCOSE, CAPILLARY
Glucose-Capillary: 102 mg/dL — ABNORMAL HIGH (ref 70–99)
Glucose-Capillary: 110 mg/dL — ABNORMAL HIGH (ref 70–99)
Glucose-Capillary: 160 mg/dL — ABNORMAL HIGH (ref 70–99)
Glucose-Capillary: 206 mg/dL — ABNORMAL HIGH (ref 70–99)
Glucose-Capillary: 69 mg/dL — ABNORMAL LOW (ref 70–99)
Glucose-Capillary: 79 mg/dL (ref 70–99)
Glucose-Capillary: 83 mg/dL (ref 70–99)
Glucose-Capillary: 89 mg/dL (ref 70–99)

## 2021-10-29 LAB — LACTIC ACID, PLASMA
Lactic Acid, Venous: 4.2 mmol/L (ref 0.5–1.9)
Lactic Acid, Venous: 1.6 mmol/L (ref 0.5–1.9)

## 2021-10-29 LAB — PHOSPHORUS: Phosphorus: 4.8 mg/dL — ABNORMAL HIGH (ref 2.5–4.6)

## 2021-10-29 LAB — TYPE AND SCREEN
ABO/RH(D): A NEG
Antibody Screen: NEGATIVE

## 2021-10-29 LAB — PROCALCITONIN: Procalcitonin: 18.87 ng/mL

## 2021-10-29 LAB — TROPONIN I (HIGH SENSITIVITY): Troponin I (High Sensitivity): 84 ng/L — ABNORMAL HIGH (ref <18)

## 2021-10-29 MED ORDER — POTASSIUM CHLORIDE 20 MEQ PO PACK
40.0000 meq | PACK | Freq: Two times a day (BID) | ORAL | Status: AC
Start: 2021-10-29 — End: 2021-10-29
  Administered 2021-10-29 (×2): 40 meq via ORAL
  Filled 2021-10-29 (×2): qty 2

## 2021-10-29 MED ORDER — DEXTROSE 5 % IV SOLN
4.0000 10*6.[IU] | Freq: Three times a day (TID) | INTRAVENOUS | Status: DC
  Filled 2021-10-29 (×3): qty 4

## 2021-10-29 MED ORDER — POTASSIUM CHLORIDE 10 MEQ/100ML IV SOLN: 10.0000 meq | INTRAVENOUS | Status: DC

## 2021-10-29 MED ORDER — POLYETHYLENE GLYCOL 3350 17 G PO PACK
17.0000 g | PACK | Freq: Every day | ORAL | Status: DC | PRN
  Administered 2021-11-04: 17 g via ORAL
  Filled 2021-10-29 (×2): qty 1

## 2021-10-29 MED ORDER — DEXTROSE 50 % IV SOLN
INTRAVENOUS | Status: AC
  Filled 2021-10-29: qty 50

## 2021-10-29 MED ORDER — HEPARIN BOLUS VIA INFUSION
4000.0000 [IU] | Freq: Once | INTRAVENOUS | Status: AC
  Administered 2021-10-29: 4000 [IU] via INTRAVENOUS
  Filled 2021-10-29: qty 4000

## 2021-10-29 MED ORDER — DEXTROSE 50 % IV SOLN
12.5000 g | Freq: Once | INTRAVENOUS | Status: AC
  Administered 2021-10-29: 12.5 g via INTRAVENOUS

## 2021-10-29 MED ORDER — CEFAZOLIN SODIUM-DEXTROSE 2-4 GM/100ML-% IV SOLN
2.0000 g | Freq: Two times a day (BID) | INTRAVENOUS | Status: DC
  Administered 2021-10-29 – 2021-11-06 (×16): 2 g via INTRAVENOUS
  Filled 2021-10-29 (×16): qty 100

## 2021-10-29 MED ORDER — LEVALBUTEROL HCL 0.63 MG/3ML IN NEBU: 0.6300 mg | INHALATION_SOLUTION | RESPIRATORY_TRACT | Status: DC | PRN

## 2021-10-29 MED ORDER — SODIUM CHLORIDE 0.9 % IV BOLUS
500.0000 mL | Freq: Once | INTRAVENOUS | Status: AC
  Administered 2021-10-29: 500 mL via INTRAVENOUS

## 2021-10-29 MED ORDER — ORAL CARE MOUTH RINSE: 15.0000 mL | OROMUCOSAL | Status: DC | PRN

## 2021-10-29 MED ORDER — VANCOMYCIN HCL 1250 MG/250ML IV SOLN: 1250.0000 mg | INTRAVENOUS | Status: DC

## 2021-10-29 MED ORDER — DIPHENHYDRAMINE HCL 50 MG/ML IJ SOLN
25.0000 mg | Freq: Four times a day (QID) | INTRAMUSCULAR | Status: DC | PRN
  Administered 2021-11-06: 25 mg via INTRAVENOUS
  Filled 2021-10-29: qty 1

## 2021-10-29 MED ORDER — FUROSEMIDE 40 MG PO TABS
80.0000 mg | ORAL_TABLET | Freq: Two times a day (BID) | ORAL | Status: DC
  Administered 2021-10-29 – 2021-10-30 (×2): 80 mg via ORAL
  Filled 2021-10-29 (×2): qty 2

## 2021-10-29 MED ORDER — HEPARIN SODIUM (PORCINE) 5000 UNIT/ML IJ SOLN: 5000.0000 [IU] | Freq: Three times a day (TID) | INTRAMUSCULAR | Status: DC

## 2021-10-29 MED ORDER — DOCUSATE SODIUM 100 MG PO CAPS
100.0000 mg | ORAL_CAPSULE | Freq: Two times a day (BID) | ORAL | Status: DC | PRN
  Administered 2021-11-02 – 2021-11-09 (×4): 100 mg via ORAL
  Filled 2021-10-29 (×4): qty 1

## 2021-10-29 MED ORDER — HEPARIN (PORCINE) 25000 UT/250ML-% IV SOLN
1500.0000 [IU]/h | INTRAVENOUS | Status: DC
  Administered 2021-10-29 – 2021-11-04 (×9): 1500 [IU]/h via INTRAVENOUS
  Filled 2021-10-29 (×9): qty 250

## 2021-10-29 MED ORDER — AQUAPHOR EX OINT
TOPICAL_OINTMENT | Freq: Every day | CUTANEOUS | Status: DC | PRN
  Filled 2021-10-29 (×2): qty 50

## 2021-10-29 MED ORDER — PENICILLIN G POTASSIUM 20000000 UNITS IJ SOLR
4.0000 10*6.[IU] | Freq: Three times a day (TID) | INTRAVENOUS | Status: DC
  Filled 2021-10-29 (×3): qty 4

## 2021-10-29 MED ORDER — CHLORHEXIDINE GLUCONATE CLOTH 2 % EX PADS
6.0000 | MEDICATED_PAD | Freq: Every day | CUTANEOUS | Status: DC
  Administered 2021-10-29 (×2): 6 via TOPICAL

## 2021-10-29 NOTE — ED Notes (Signed)
Patient en route to [facility] via [facility].

## 2021-10-29 NOTE — Progress Notes (Signed)
PHARMACY - PHYSICIAN COMMUNICATION CRITICAL VALUE ALERT - BLOOD CULTURE IDENTIFICATION (BCID)   Assessment:  Kristine [name] is an 76 y.o. female who presented to [facility] on 10/28/2021 with a chief complaint of AMS and fatigue. Started on vancomycin/cefepime for presumed sepsis. Concern for osteomyelitis of feet or septic arthritis or cellulitis of knee. X-ray without signs of infection. Patient underwent drainage of left knee hematoma and placement of drain in March. MRIs ordered. Tm 103.3, WBC 43.8.   Cultures growing 2/3 bottles GPCs in chains, identified as Streptococcus agalactiae (GBS) on BCID with no resistance mechanisms detected.   Name of physician (or Provider) Contacted: Johnnette Gourd, Rph to discuss with Kristine Bullock, MD   Current antibiotics: Vancomycin/Cefepime   Changes to prescribed antibiotics recommended:  STOP vancomycin and cefepime START PCN G IV 4 million units IV Q8H  Awaiting pharmacist discussion with MD on unit   Results for orders placed or performed during the hospital encounter of 10/28/21  Blood Culture ID Panel (Reflexed) (Collected: 10/28/2021  7:25 PM)  Result Value Ref Range   Enterococcus faecalis NOT DETECTED NOT DETECTED   Enterococcus Faecium NOT DETECTED NOT DETECTED   Listeria monocytogenes NOT DETECTED NOT DETECTED   Staphylococcus species NOT DETECTED NOT DETECTED   Staphylococcus aureus (BCID) NOT DETECTED NOT DETECTED   Staphylococcus epidermidis NOT DETECTED NOT DETECTED   Staphylococcus lugdunensis NOT DETECTED NOT DETECTED   Streptococcus species DETECTED (A) NOT DETECTED   Streptococcus agalactiae DETECTED (A) NOT DETECTED   Streptococcus pneumoniae NOT DETECTED NOT DETECTED   Streptococcus pyogenes NOT DETECTED NOT DETECTED   A.calcoaceticus-baumannii NOT DETECTED NOT DETECTED   Bacteroides fragilis NOT DETECTED NOT DETECTED   Enterobacterales NOT DETECTED NOT DETECTED   Enterobacter cloacae complex NOT DETECTED NOT DETECTED    Escherichia coli NOT DETECTED NOT DETECTED   Klebsiella aerogenes NOT DETECTED NOT DETECTED   Klebsiella oxytoca NOT DETECTED NOT DETECTED   Klebsiella pneumoniae NOT DETECTED NOT DETECTED   Proteus species NOT DETECTED NOT DETECTED   Salmonella species NOT DETECTED NOT DETECTED   Serratia marcescens NOT DETECTED NOT DETECTED   Haemophilus influenzae NOT DETECTED NOT DETECTED   Neisseria meningitidis NOT DETECTED NOT DETECTED   Pseudomonas aeruginosa NOT DETECTED NOT DETECTED   Stenotrophomonas maltophilia NOT DETECTED NOT DETECTED   Candida albicans NOT DETECTED NOT DETECTED   Candida auris NOT DETECTED NOT DETECTED   Candida glabrata NOT DETECTED NOT DETECTED   Candida krusei NOT DETECTED NOT DETECTED   Candida parapsilosis NOT DETECTED NOT DETECTED   Candida tropicalis NOT DETECTED NOT DETECTED   Cryptococcus neoformans/gattii NOT DETECTED NOT DETECTED    Adria Dill, PharmD PGY-2 Infectious Diseases Resident  10/29/2021 11:02 AM

## 2021-10-29 NOTE — Progress Notes (Addendum)
[facility] Physician-Brief Progress Note Patient Name: Kristine [name] DOB: 04-Oct-1945 MRN: [id number]   Date of Service  10/29/2021  HPI/Events of Note  Lactic acid 4.2, BNP 276.  eICU Interventions  Will order a 500 ml NS bolus and trend lactic acid.        Kerry Kass Melysa Schroyer 10/29/2021, 3:45 AM

## 2021-10-29 NOTE — Consult Note (Addendum)
Cardiology Consultation:   Patient ID: Kristine [name] MRN: [id number]; DOB: 1945-05-27  Admit date: 10/28/2021 Date of Consult: 10/29/2021  PCP:  Prince Solian, MD   [facility] Providers Cardiologist:  Glori Bickers, MD     Patient Profile:   Kristine [name] is a 76 y.o. female with a hx of persistent atrial fibrillation, CHF, CKD, DM, hypertension, hypothyroidism, hyperlipidemia, sleep apnea who is being seen 10/29/2021 for the evaluation of CHF at the request of Dr. Duwayne Heck.  History of Present Illness:   Kristine [name] is a 76 year old female with above medical history who is followed by Dr. Haroldine Laws.  Per chart review, patient has a long history of diastolic heart failure, atrial fibrillation.  She was followed  primarily by Dr. Acie Fredrickson until 2022. He had her try multiple antiarrhythmic therapies, however now has chronic atrial fibrillation on rate controlling medications.   Patient had an echocardiogram in May 2022 that showed EF 45-50%, severely reduced RV systolic function, moderately elevated pulmonary artery systolic pressure, moderate pericardial effusion, mild-moderate MR, mild-moderate TR. there was interventricular septal flattening in systole and diastole consistent with RV pressure and volume overload.    Patient had been having a hard time managing her fluid for over a year, so she was referred to Dr. Haroldine Laws with the advanced heart failure clinic and was seen on 08/21/2020.  Per Dr. Clayborne Dana note on 08/21/2020, patient had right>left heart failure.  Suspected that she has diastolic dysfunction with restrictive physiology and secondary pulmonary hypertension.  Believed she had OSA/OHS contributing to RV dysfunction.  She had a sleep study in 09/2020 that showed moderate OSA, she was started on CPAP.  Patient was admitted to the hospital in 08/2021 for evaluation of shortness of breath.  Due to worsening renal function, patient's nephrologist had held some of her  home diuretics.  Since then, she had had progressive weight gain, lower extremity edema, dyspnea.  Echocardiogram on 09/12/2021 showed EF 55-60%, no regional wall motion abnormalities, mild LVH, severely reduced RV systolic function, severely elevated pulmonary artery systolic pressure.  There was interventricular septal flattening in diastole consistent with right ventricular volume overload.  Patient was diuresed with IV Lasix, metolazone.  Patient was last seen by cardiology on 10/01/2021.  She was seen at the advanced heart failure clinic, was doing well at that time.  Recommended patient undergo outpatient PYP scan.  Yesterday 8/8, patient's husband called the cardiology answering service.  Reported that patient had a PYP cardiac amyloidosis scan earlier that day.  She initially had tolerated the procedure well, developed a temp of 100.5 F and started having chills.  She was encouraged to go to the ER.  Patient presented to the ER on 8/8 complaining of fever, altered mental status.  Patient was incontinent of urine while staff attempted to help her out of her vehicle.  In the ED, patient was febrile to 101.55F, tachycardic to 125, hypoxic to 70s on room air, hypotensive with BP 91/45.  She was also noted to have altered mental status.   Workup in the ED notable for WBC 26, K 2.7, creatinine 2.86, WBC 26.0, hemoglobin 11.8, platelets 192.  Lactic acid 2.6.  COVID test negative.  Initial blood culture ID panel positive for streptococcus agalactiae.  EKG showed atrial fibrillation, HR 123 BPM, low voltage QRS complexes.  hsTn 53>>84. BNP elevated to 276.7.  CT chest/a/p negative for acute cardiopulmonary disease, but showed changes c/w cirrhosis, anasarca.   Patient was admitted to the [facility] service. Was  started on broad spectrum antibiotics and peripheral NE for pressor support. This AM, she was able to be weaned off pressor support.   On interview, patient and family report that she was in her usual  state of health until yesterday at 4:30 PM. She had gotten her PYP scan earlier in the day, went home, and took a nap. When she woke up from the nap, her daughter noticed that she was shaking and was warm to the touch. She was confused, so family took her to the ED. Overnight, patient improved significantly. Confusion is improving. Patient denies any chest pain, sob, orthopnea currently. Does have some abdominal distention and ankle edema   Prior to this episode, patient denied having chest pain. Had some dyspnea on exertion and orthopnea, both of which were fairly typical for her. She denied any nausea, vomiting, body aches, chills, cough. Reported good compliance with her heart medications.   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   Anxiety    Arthritis    Asthma    Atrial fibrillation ([place])    CAP (community acquired pneumonia) 05/31/2015   Cataract    CHF (congestive heart failure) (HCC)    Chronic bronchitis (HCC)    Chronic kidney disease    Clotting disorder ([place])    Community acquired pneumonia 05/31/2015   Depression    Dysrhythmia    GERD (gastroesophageal reflux disease)    High cholesterol    History of hiatal hernia    HTN (hypertension)    Mild aortic sclerosis    Morbid obesity (HCC)    Neuromuscular disorder ([place])    Neuropathic pain    Osteoporosis    Persistent atrial fibrillation ([place])    Scoliosis    Sleep apnea    Spinal stenosis    Thyroid disease    Type II diabetes mellitus ([place])    Vitamin D deficiency     Past Surgical History:  Procedure Laterality Date   BLADDER SUSPENSION  1980s   CARDIAC CATHETERIZATION  11/16/09   SMOOTH AND NORMAL   CARDIOVERSION N/A 01/07/2015   Procedure: CARDIOVERSION;  Surgeon: Skeet Latch, MD;  Location: [facility] ENDOSCOPY;  Service: Cardiovascular;  Laterality: N/A;   CARDIOVERSION N/A 03/11/2015   Procedure: CARDIOVERSION;  Surgeon: Skeet Latch, MD;  Location: [place];  Service: Cardiovascular;  Laterality: N/A;   CARPAL TUNNEL RELEASE Left 1970s   CATARACT EXTRACTION W/ INTRAOCULAR LENS  IMPLANT, BILATERAL Bilateral ~ 2010   COLONOSCOPY  08/14/08   FINGER FRACTURE SURGERY Right 1970s   "ring finger"   FRACTURE SURGERY     HEMATOMA EVACUATION Left 05/21/2021   Procedure: EVACUATION LEFT KNEE HEMATOMA;  Surgeon: Lennice Sites, MD;  Location: [place];  Service: Plastics;  Laterality: Left;   LAPAROSCOPIC CHOLECYSTECTOMY  1980s   TUBAL LIGATION     VAGINAL HYSTERECTOMY  1980     Home Medications:  Prior to Admission medications   Medication Sig Start Date End Date Taking? Authorizing Provider  acetaminophen (TYLENOL) 500 MG tablet Take 1,000 mg by mouth every 6 (six) hours as needed for moderate pain or headache.    [provider]  allopurinol (ZYLOPRIM) 300 MG tablet Take 300 mg by mouth in the morning.    [provider]  ALPRAZolam Duanne Moron) 0.5 MG tablet Take 0.5 mg by mouth every 6 (six) hours as needed for anxiety.    [provider]  apixaban (ELIQUIS) 2.5 MG TABS tablet Take  1 tablet (2.5 mg total) by mouth 2 (two) times daily. 10/15/21   Alla Feeling, NP  BD PEN NEEDLE NANO U/F 32G X 4 MM MISC Inject 1 each as directed See admin instructions. Use pen needles with insulin pens daily 12/24/14   [provider]  colchicine 0.6 MG tablet Take 0.6 mg by mouth daily as needed (gout flares). 04/07/21   [provider]  diclofenac Sodium (VOLTAREN) 1 % GEL Apply 4 g topically 4 (four) times daily as needed (back pain). 09/19/21   Barton Dubois, MD  docusate sodium (COLACE) 100 MG capsule Take 100 mg by mouth daily as needed for mild constipation.    [provider]  DULoxetine (CYMBALTA) 30 MG capsule Take 30 mg by mouth in the morning. 09/26/20   [provider]  estradiol (ESTRACE) 0.1 MG/GM vaginal cream Place 1 Applicatorful vaginally as needed (vaginal irritation). 12/18/16   [provider]  furosemide (LASIX) 80 MG tablet TAKE 1 AND 1/2 TABLETS BY MOUTH TWICE DAILY 10/06/21   Bensimhon, Shaune Pascal, MD  HYDROcodone-acetaminophen (NORCO/VICODIN) 5-325 MG tablet Take 1 tablet by mouth every 6 (six) hours as needed for severe pain. 04/09/21   Etta Quill, NP  insulin aspart (NOVOLOG FLEXPEN) 100 UNIT/ML FlexPen 40-70 Units See admin instructions. Less than 150 units 150=40 units151-199=45 units 200-249=50 units 250-299=55 units 300-349=60 units    [provider]  Insulin Degludec (TRESIBA FLEXTOUCH) 200 UNIT/ML SOPN Inject 100 Units into the skin in the morning and at bedtime.    [provider]  levothyroxine (SYNTHROID) 88 MCG tablet Take 88 mcg by mouth every morning. 08/14/21   [provider]  lidocaine-prilocaine (EMLA) cream 1 gram qid prn 01/07/21   Marcial Pacas, MD  LYRICA 50 MG capsule Take 50 mg by mouth 2 (two) times daily. 06/01/17   [provider]  metolazone (ZAROXOLYN) 2.5 MG tablet Take 1 tablet (2.5 mg total) by mouth 3 (three) times a week. Mon, Wed, Fri. 09/22/21   Barton Dubois, MD  metoprolol succinate (TOPROL-XL) 50 MG 24 hr tablet Take 1/2 tablet (25 mg total) by mouth daily. 09/19/21   Barton Dubois, MD  nystatin (MYCOSTATIN/NYSTOP) powder Apply 1 application topically as needed (skin irritation/rash). 01/13/19   [provider]  ondansetron (ZOFRAN) 4 MG tablet Take 1 tablet (4 mg total) by mouth every 8 (eight) hours as needed for nausea or vomiting. 05/21/21   Scheeler, Carola Rhine, PA-C  Polyethyl Glycol-Propyl Glycol (SYSTANE OP) Place 1 drop into both eyes 2 (two) times daily as needed (dry eyes).    [provider]  polyethylene glycol (MIRALAX / GLYCOLAX) 17 g packet Take 17 g by mouth daily as needed (constipation.).    [provider]  potassium chloride SA (KLOR-CON M) 20 MEQ tablet Take 3 tablets (60 mEq total) by mouth every morning AND 2 tablets (40 mEq total) every evening. 10/03/21    Rosita Fire, Brittainy M, PA-C  REGRANEX 0.01 % gel Apply 1 Application topically daily. Apply to left foot 09/09/21   [provider]  traMADol (ULTRAM) 50 MG tablet Take 50 mg by mouth 3 (three) times daily as needed (severe neuropathy pain.). 06/24/19   [provider]  Vitamin D, Ergocalciferol, (DRISDOL) 50000 UNITS CAPS Take 50,000 Units by mouth every Monday.    [provider]    Inpatient Medications: Scheduled Meds:  Chlorhexidine Gluconate Cloth  6 each Topical Daily   [START ON 10/30/2021] heparin  5,000 Units Subcutaneous  Q8H   Continuous Infusions:  ceFEPime (MAXIPIME) IV     potassium chloride     [START ON 10/30/2021] vancomycin     PRN Meds: docusate sodium, levalbuterol, mineral oil-hydrophilic petrolatum, mouth rinse, polyethylene glycol  Allergies:    Allergies  Allergen Reactions   Albuterol Other (See Comments)    REACTION: Tachycardia- AFib   Epinephrine Other (See Comments)    Increases heart rate per patient.    Penicillins Other (See Comments)    REACTION: flushing' \\T'$ \ hot  Did it involve swelling of the face/tongue/throat, SOB, or low BP? No Did it involve sudden or severe rash/hives, skin peeling, or any reaction on the inside of your mouth or nose? No Did you need to seek medical attention at a hospital or doctor's office? No When did it last happen?    20 years ago   If all above answers are "NO", may proceed with cephalosporin use.   Ceftriaxone Other (See Comments)    Sweats   Citalopram Hydrobromide Other (See Comments)    Prolonged QT prolongation   Exenatide Other (See Comments)    Unknown reaction   Ketorolac Tromethamine Hives and Swelling    swelling in hands   Tizanidine Other (See Comments)    lethargic   Torsemide Swelling   Zoledronic Acid Other (See Comments)    Pt was hospitalized due to medication   Bee Venom Other (See Comments)    "makes me nervous"   Ivp Dye [Iodinated Contrast Media] Other (See  Comments)    flushing   Keflex [Cephalexin] Other (See Comments)    Makes her feel warm; she was told never to take it.    Social History:   Social History   Socioeconomic History   Marital status: Married    Spouse name: Darlyn Repsher   Number of children: 4   Years of education: 14   Highest education level: Bachelor's degree (e.g., BA, AB, BS)  Occupational History   Occupation: Retired  Tobacco Use   Smoking status: Never   Smokeless tobacco: Never  Vaping Use   Vaping Use: Never used  Substance and Sexual Activity   Alcohol use: No   Drug use: No   Sexual activity: Yes  Other Topics Concern   Not on file  Social History Narrative   Pt lives in [place] with spouse.   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   Right-handed.   1 cup caffeine per day.   Social Determinants of Health   Financial Resource Strain: Low Risk  (09/12/2021)   Overall Financial Resource Strain (CARDIA)    Difficulty of Paying Living Expenses: Not very hard  Food Insecurity: No Food Insecurity (09/12/2021)   Hunger Vital Sign    Worried About Running Out of Food in the Last Year: Never true    Ran Out of Food in the Last Year: Never true  Transportation Needs: No Transportation Needs (09/12/2021)   PRAPARE - Hydrologist (Medical): No    Lack of Transportation (Non-Medical): No  Physical Activity: Not on file  Stress: Not on file  Social Connections: Not on file  Intimate Partner Violence: Not on file    Family History:    Family History  Problem Relation Age of Onset   Stroke Father    Hypertension Father    Atrial fibrillation Father  HAD MURMUR   Heart failure Mother    Congestive Heart Failure Mother    Hypertension Mother    Hypertension Brother    Hypertension Sister    Hypertension Son    CAD Sister        EARLY   CAD Brother        EARLY    Colon cancer Neg Hx    Esophageal cancer Neg Hx    Stomach cancer Neg Hx    Rectal cancer Neg Hx      ROS:  Please see the history of present illness.   All other ROS reviewed and negative.     Physical Exam/Data:   Vitals:   10/29/21 1000 10/29/21 1100 10/29/21 1200 10/29/21 1230  BP: 101/72 90/65 106/62   Pulse: 82 89 93   Resp: '15 13 13   '$ Temp:    (!) 97.5 F (36.4 C)  TempSrc:    Oral  SpO2: 100% 100% 100%   Weight:      Height:        Intake/Output Summary (Last 24 hours) at 10/29/2021 1255 Last data filed at 10/29/2021 0600 Gross per 24 hour  Intake 4885.51 ml  Output 100 ml  Net 4785.51 ml      10/29/2021    1:11 AM 10/28/2021    7:11 PM 10/19/2021    7:47 AM  Last 3 Weights  Weight (lbs) 301 lb 5.9 oz 292 lb 289 lb 8 oz  Weight (kg) 136.7 kg 132.45 kg 131.316 kg     Body mass index is 44.5 kg/m.  General:  Pleasant elderly female resting in the bed in no acute distress  HEENT: normal Neck: no JVD Vascular: Radial pulses 2+ bilaterally Cardiac:  normal S1, S2; irregular rate and rhythm; no murmur  Lungs:  clear to auscultation bilaterally, no wheezing, rhonchi or rales. Normal WOB on room air  Abd: soft, mildly distended, nontender  Ext: 2+ pitting edema in BLE  Musculoskeletal:  No deformities, BUE and BLE strength normal and equal Skin: warm and dry  Neuro:  CNs 2-12 intact, no focal abnormalities noted Psych:  Normal affect   EKG:  The EKG was personally reviewed and demonstrates:  atrial fibrillation, HR 123 BPM, low voltage QRS complexes. Telemetry:  Telemetry was personally reviewed and demonstrates:  Atrial fibrillation, HR in the 80s   Relevant CV Studies:   Laboratory Data:  High Sensitivity Troponin:   Recent Labs  Lab 10/28/21 1945 10/28/21 2139  TROPONINIHS 53* 84*     Chemistry Recent Labs  Lab 10/28/21 1920 10/28/21 1923 10/28/21 1944 10/29/21 0439 10/29/21 1009  NA 134* 135  --  134* 134*  K 2.7* 2.7*  --  3.0* 3.1*  CL  91*  --   --  93* 95*  CO2 28  --   --  27 26  GLUCOSE 117*  --   --  82 94  BUN 89*  --   --  85* 86*  CREATININE 2.86*  --   --  2.87* 2.91*  CALCIUM 9.6  --   --  8.8* 8.9  MG  --   --  2.4 2.3  --   GFRNONAA 17*  --   --  17* 16*  ANIONGAP 15  --   --  14 13    Recent Labs  Lab 10/28/21 1920 10/29/21 0439  PROT 7.8 5.7*  ALBUMIN 4.0 2.9*  AST 21 16  ALT 9 9  ALKPHOS 110  76  BILITOT 2.2* 2.1*   Lipids No results for input(s): "CHOL", "TRIG", "HDL", "LABVLDL", "LDLCALC", "CHOLHDL" in the last 168 hours.  Hematology Recent Labs  Lab 10/28/21 1920 10/28/21 1923 10/29/21 0201 10/29/21 0439  WBC 26.0*  --  40.6* 43.8*  RBC 4.12  --  3.24* 3.41*  HGB 11.8* 14.6 9.3* 10.0*  HCT 36.6 43.0 29.0* 30.0*  MCV 88.8  --  89.5 88.0  MCH 28.6  --  28.7 29.3  MCHC 32.2  --  32.1 33.3  RDW 17.2*  --  17.2* 16.8*  PLT 192  --  138* 142*   Thyroid  Recent Labs  Lab 10/29/21 1009  TSH 2.149    BNP Recent Labs  Lab 10/28/21 1945  BNP 276.7*    DDimer No results for input(s): "DDIMER" in the last 168 hours.   Radiology/Studies:  DG Chest Port 1 View  Result Date: 10/29/2021 CLINICAL DATA:  Septic shock, altered mental status EXAM: PORTABLE CHEST 1 VIEW COMPARISON:  09/17/2021, 10/28/2021 FINDINGS: Stable cardiomegaly. Pulmonary vascular congestion. Mild diffuse interstitial prominence. Streaky bibasilar opacities. No large pleural fluid collection. No pneumothorax. IMPRESSION: Cardiomegaly with pulmonary vascular congestion and mild interstitial edema. Streaky bibasilar opacities likely representing atelectasis. Electronically Signed   By: Davina Poke D.O.   On: 10/29/2021 08:53   DG Knee Left Port  Result Date: 10/28/2021 CLINICAL DATA:  Knee wound EXAM: PORTABLE LEFT KNEE - 1-2 VIEW COMPARISON:  None Available. FINDINGS: No evidence of fracture, dislocation, or joint effusion. No evidence of arthropathy or other focal bone abnormality. Soft tissues are unremarkable.  IMPRESSION: Negative. Electronically Signed   By: Rolm Baptise M.D.   On: 10/28/2021 23:13   CT CHEST ABDOMEN PELVIS WO CONTRAST  Result Date: 10/28/2021 CLINICAL DATA:  Sepsis, fever EXAM: CT CHEST, ABDOMEN AND PELVIS WITHOUT CONTRAST TECHNIQUE: Multidetector CT imaging of the chest, abdomen and pelvis was performed following the standard protocol without IV contrast. RADIATION DOSE REDUCTION: This exam was performed according to the departmental dose-optimization program which includes automated exposure control, adjustment of the mA and/or kV according to patient size and/or use of iterative reconstruction technique. COMPARISON:  02/19/2021 FINDINGS: CT CHEST FINDINGS Cardiovascular: Coronary artery and aortic calcifications. Heart is mildly enlarged. Aorta normal caliber. Mediastinum/Nodes: Mildly prominent mediastinal lymph nodes, stable since prior study, likely reactive. No axillary adenopathy. Lungs/Pleura: No confluent opacities or effusions. Musculoskeletal: Chest wall soft tissues are unremarkable. No acute bony abnormality. CT ABDOMEN PELVIS FINDINGS Hepatobiliary: Nodular contours of the liver suggest cirrhosis. No focal hepatic abnormality. Prior cholecystectomy. Pancreas: No focal abnormality or ductal dilatation. Spleen: Spleen his upper limits normal in size at 12.5 cm. No focal abnormality. Adrenals/Urinary Tract: No adrenal abnormality. No focal renal abnormality. No stones or hydronephrosis. Urinary bladder is unremarkable. Stomach/Bowel: Sigmoid diverticulosis. No active diverticulitis. Stomach and small bowel decompressed, unremarkable. Vascular/Lymphatic: Aortic atherosclerosis. No evidence of aneurysm or adenopathy. Reproductive: Prior hysterectomy.  No adnexal masses. Other: No free fluid or free air. Edema throughout the abdominal wall subcutaneous soft tissues. Musculoskeletal: No acute bony abnormality. IMPRESSION: Cardiomegaly, coronary artery disease. Aortic atherosclerosis. No acute  cardiopulmonary disease. Changes of cirrhosis. Sigmoid diverticulosis. Anasarca like edema throughout the abdominal wall. Electronically Signed   By: Rolm Baptise M.D.   On: 10/28/2021 20:37   DG Foot Complete Right  Result Date: 10/28/2021 CLINICAL DATA:  t brought in by family with complaints of fever and altered mental status. Incontinent of urine while staff attempting to get out of vehicle Family  report cardiac procedure nuclear study toda EXAM: RIGHT FOOT COMPLETE - 3+ VIEW COMPARISON:  None Available. FINDINGS: There is no evidence of fracture or dislocation. Bite-like erosion of the base of the first digit fall fulfilling the overhanging edge. Degenerative changes of the distal interphalangeal joints. Plantar calcaneal spur. Cortical irregularity along the base of the fifth metatarsal likely old healed fracture. Soft tissues are unremarkable. IMPRESSION: Query gouty changes of the base of the proximal phalanx of the first digit. No definite findings of acute osteomyelitis. If concern, consider MRI for further evaluation. Electronically Signed   By: Iven Finn M.D.   On: 10/28/2021 20:30   CT Head Wo Contrast  Result Date: 10/28/2021 CLINICAL DATA:  Altered mental status EXAM: CT HEAD WITHOUT CONTRAST TECHNIQUE: Contiguous axial images were obtained from the base of the skull through the vertex without intravenous contrast. RADIATION DOSE REDUCTION: This exam was performed according to the departmental dose-optimization program which includes automated exposure control, adjustment of the mA and/or kV according to patient size and/or use of iterative reconstruction technique. COMPARISON:  05/06/2012 FINDINGS: Brain: There is atrophy and chronic small vessel disease changes. No acute intracranial abnormality. Specifically, no hemorrhage, hydrocephalus, mass lesion, acute infarction, or significant intracranial injury. Vascular: No hyperdense vessel or unexpected calcification. Skull: No acute  calvarial abnormality. Sinuses/Orbits: No acute findings Other: None IMPRESSION: Atrophy, chronic microvascular disease. No acute intracranial abnormality. Electronically Signed   By: Rolm Baptise M.D.   On: 10/28/2021 20:29   DG Foot Complete Left  Result Date: 10/28/2021 CLINICAL DATA:  Sore foot, fever EXAM: LEFT FOOT - COMPLETE 3+ VIEW COMPARISON:  None Available. FINDINGS: Postoperative changes in the midfoot and hindfoot from prior fusion. No acute bony abnormality. Specifically, no fracture, subluxation, or dislocation. No bone destruction to suggest osteomyelitis. Possible soft tissue ulcer at the base of the great toe. IMPRESSION: No acute bony abnormality. Electronically Signed   By: Rolm Baptise M.D.   On: 10/28/2021 20:28   NM CARDIAC AMYLOID TUMOR LOC INFLAM SPECT 1 DAY  Result Date: 10/28/2021 CLINICAL DATA:  HEART FAILURE. CONCERN FOR CARDIAC AMYLOIDOSIS. EXAM: NUCLEAR MEDICINE TUMOR LOCALIZATION. PYP CARDIAC AMYLOIDOSIS SCAN WITH SPECT TECHNIQUE: Following intravenous administration of radiopharmaceutical, anterior planar images of the chest were obtained. Regions of interest were placed on the heart and contralateral chest wall for quantitative assessment. Additional SPECT imaging of the chest could not be obtained due to thoracic deformity, body habitus and patient inability to lie flat or still due to back pain. RADIOPHARMACEUTICALS:  21.7 mCi TC- 56mPYROPHOSPHATE IV FINDINGS: Planar Visual assessment: Anterior planar imaging demonstrates radiotracer uptake within the heart less than uptake within the adjacent ribs (Grade 1). Quantitative assessment : Quantitative assessment of the cardiac uptake compared to the contralateral chest wall is equal to 1.05 (H/CL = 1.05). SPECT assessment: Unable to be performed, as above IMPRESSION: Visual and quantitative assessment (grade 1, H/CL = 1.05) is equivocally suggestive of transthyretin amyloidosis. Electronically Signed   By: MLavonia DanaM.D.    On: 10/28/2021 13:35     Assessment and Plan:   Chronic right sided heart failure  Chronic HFpEF  Pulmonary HTN  Transthyretin Amyloidosis  - Echo from 08/08/20 with EF 45-50%, severely reduced RV function, RVSP 55, D-shaped septum, severe biatrial enlargement  - Echo from 09/12/21 with EF 55-60%, severely reduced RV systolic function, RVSP 60, D-shaped septum  - Per review of advanced heart failure notes, given patient's history of atrial fibrillation, low voltage QRS complexes, CKD,  and polyneuropathy there was concern for possible amyloid - Underwent PYP scan on 8/8 that suggested transthyretin amyloidosis. - Now, patient is admitted with sepsis/septic shock. Was weaned off pressors this AM. Receiving IV abx  - Holding home lasix and metolazone due to low BP. Closely monitor for volume overload and resume when able. - Currently net +4.8 L since admission with little recorded urine output  - Patient starting to have some ankle edema and abdominal distention, start PO lasix 80 mg BID for now and titrate  (note- was on lasix 120 mg BID and metolazone three times weekly)  - Closely follow urine output-- may need IV lasix when BP improves further  - Similarly, home BB held due to hypotension  - Not on ACEi/ARB/ARNI or MRA due to poor renal function   Chronic Atrial Fibrillation - Metoprolol on hold as above -- Per telemetry, HR is in the 80s - PTA patient was taking xarelto 15 mg daily, this was held 1 week ago due to hemoptysis and bleeding from knee. 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 The patient's score is based upon: CHF History: 1 HTN History: 1 Diabetes History: 1 Stroke History: 0 Vascular Disease History: 0 Age Score: 1 Gender Score: 1        For questions or updates, please contact [place] Please consult [email or web address] for contact info under    Signed, Margie Billet, PA-C  10/29/2021 12:55 PM  Patient seen and examined and agree with Vikki Ports, PA-C as detailed above.    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 Cardiology is consulted for ongoing management of HFpEF and RV failure.  Patient has known history of mainly right sided heart failure. Was hospitalized in 08/2021 for worsening SOB and volume overload in the setting of recently decreasing her diuretics. TTE that admission revealed EF 55-60%, no regional wall motion abnormalities, mild LVH, severely reduced RV systolic function, severely elevated pulmonary artery systolic pressure. She was diuresed with IV lasix and metolazone and discharged on lasix '120mg'$  BID and metolazone 2.'5mg'$  three times weekly.  She was seen in follow-up by HF clinic and was doing okay. Underwent PYP scan yesterday which was equivocal but grossly not likely to be significant amyloidosis causing her heart failure. Following the PYP scan, she developed fevers, AMS and rigors prompting her to come to the ER.  Here, temp 101.9, HR 125, BP 90/40s. WBC 26. Lactate 2.6. Blood cultures positive for step. She was admitted to MICU where she received IVF, pressors and IV antibiotics with significant improvement. She has since been weaned off of pressors and  BP has improved.   On exam, the patient is alert and conversant. BP improved to 110/70s. Remains  in rate controlled Afib. She is mildly hypervolemic on exam. Will restart oral lasix for now given improvement in blood pressure. Likely will need IV once she is more clinically improved. Plan to re-challenge with IV heparin and transition to apixaban on discharge if no evidence of bleeding (was previously on xarelto).   GEN: Laying in bed, comfortable and alert  Neck: JVD difficult to assess due to body habitus Cardiac: Irregular, no murmurs Respiratory: Clear bilaterally GI: Soft, nontender, non-distended  MS: 1+ bilateral pitting edema. LE wounds wrapped.  Neuro:  Nonfocal  Psych: Normal affect    Plan: -Resume PO lasix '80mg'$  BID; likely will need IV once more clinically stable from a sepsis standpoint -Will start IV heparin with plans to transition to apixaban long-term if no evidence of bleeding (had oral bleeding with xarelto) -Management of sepsis per primary team -Do not suspect amyloidosis is the primary driver of her heart failure and review of PYP scan shows it is equivocal -Holding metoprolol in the setting of sepsis/hypotension; HR remains controlled  Gwyndolyn Kaufman, MD

## 2021-10-29 NOTE — Progress Notes (Signed)
0300: Lab called with a K of 7.5. Notified eLink. Requesting labs to be collected again. IV Potassium paused.   0515: Potassium resulted with 3.0. Resumed IV Potassium. Notified eLink.  Lesle Reek, RN

## 2021-10-29 NOTE — Progress Notes (Signed)
[facility] Progress Note Patient Name: Kristine [name] DOB: 06-12-1945 MRN: [id number]   Date of Service  10/29/2021  HPI/Events of Note  Patient with a complicated past medical history admitted with altered mental status, suspected sepsis of unclear etiology, and atrial fibrillation, work up is in progress.  eICU Interventions  New Patient Evaluation.        Kerry Kass [name] 10/29/2021, 2:16 AM

## 2021-10-29 NOTE — Progress Notes (Signed)
[facility] Progress Note Patient Name: Kristine [name] DOB: 1945/04/07 MRN: [id number]   Date of Service  10/29/2021  HPI/Events of Note  Patient ordered PCN G for Strep species and Strep agalactiae on BCID. Even though patient does not have real allergy to PCN (patient felt hot and reported flushing). Reaction did not involve swelling of the face/tongue/throat, SOB, or low BP. Reaction did not involve sudden or severe rash/hives, skin peeling, or any reaction on the inside of patient's mouth or nose. Patient did not need to seek medical attention at a hospital or doctor's office. Therefore, it should be safe to proceed with cephalosporin treatment. Spoke with the [place] who agrees with this plan.   eICU Interventions  Plan: D/C PCN G. Cefazolin 2 gm IV now and Q 12 hours. Benadryl 25 mg IV Q 6 hours PRN itching or allergy.        [name] Cornelia Copa 10/29/2021, 9:47 PM

## 2021-10-29 NOTE — Progress Notes (Signed)
Pharmacy Antibiotic Note  Kristine [name] is a 76 y.o. female for which pharmacy has been consulted for cefepime and vancomycin dosing for sepsis.  SCr  2.87 (BL SCr 2.2-2.4), now afebrile, WBC up to 43.8.  Plan: Schedule vanc '1250mg'$  IV Q48H for AUC 505 using SCr 2.87 Continue cefepime 2gm IV Q24H Monitor renal fxn, clinical progress, vanc levels as indicated  Height: '5\' 9"'$  (175.3 cm) Weight: (!) 136.7 kg (301 lb 5.9 oz) IBW/kg (Calculated) : 66.2  Temp (24hrs), Avg:100.4 F (38 C), Min:97.8 F (36.6 C), Max:103.3 F (39.6 C)  Recent Labs  Lab 10/28/21 1920 10/28/21 2139 10/29/21 0201 10/29/21 0439  WBC 26.0*  --  40.6* 43.8*  CREATININE 2.86*  --   --  2.87*  LATICACIDVEN 2.8* 2.4* 4.2* 1.6     Estimated Creatinine Clearance: 25.6 mL/min (A) (by C-G formula based on SCr of 2.87 mg/dL (H)).    Allergies  Allergen Reactions   Albuterol Other (See Comments)    REACTION: Tachycardia- AFib   Epinephrine Other (See Comments)    Increases heart rate per patient.    Penicillins Other (See Comments)    REACTION: flushing' \\T'$ \ hot  Did it involve swelling of the face/tongue/throat, SOB, or low BP? No Did it involve sudden or severe rash/hives, skin peeling, or any reaction on the inside of your mouth or nose? No Did you need to seek medical attention at a hospital or doctor's office? No When did it last happen?    20 years ago   If all above answers are "NO", may proceed with cephalosporin use.   Ceftriaxone Other (See Comments)    Sweats   Citalopram Hydrobromide Other (See Comments)    Prolonged QT prolongation   Exenatide Other (See Comments)    Unknown reaction   Ketorolac Tromethamine Hives and Swelling    swelling in hands   Tizanidine Other (See Comments)    lethargic   Torsemide Swelling   Zoledronic Acid Other (See Comments)    Pt was hospitalized due to medication   Bee Venom Other (See Comments)    "makes me nervous"   Ivp Dye [Iodinated Contrast  Media] Other (See Comments)    flushing   Keflex [Cephalexin] Other (See Comments)    Makes her feel warm; she was told never to take it.   Cefepime 8/8 >>  Vanc 8/8 >>  8/9 MRSA PCR - negative 8/8 BCx - NGTD  Kason Benak D. Mina Marble, PharmD, BCPS, [place] 10/29/2021, 7:43 AM

## 2021-10-29 NOTE — Progress Notes (Addendum)
NAME:  Kristine [name], MRN:  [id number], DOB:  11-27-1945, LOS: 0 ADMISSION DATE:  10/28/2021 CONSULTATION DATE:  10/29/2021 REFERRING MD:  [facility] - EDP CHIEF COMPLAINT:  Sepsis   History of Present Illness:  76 year old woman who presented to [facility] 8/8 with fatigue and AMS. PMHx significant for HTN, HLD, AF (previously on Eliquis), mild AS, CHF with cardiac amyloidosis (TTR, diagnosed 10/2021), COPD with chronic bronchitis, OSA, T2DM, hypothyroidism, CKD stage IV, GERD, depression/anxiety.  Per chart review/patient's husband, patient returned from [facility] scan 8/8 and appeared extremely weak/fatigued and unable to get from the car to the house. Shortly after arriving home, patient c/o chills/being "freezing" and progressively became altered with increasing confusion and tachypnea. Tmax 101F measured at home and Tylenol was given. Of note, patient has had longstanding sores on bilateral feet (slow to heal in the setting of DM) and L knee (occurred s/p hematoma drainage).  On ED arrival, patient was febrile to 101.22F, tachycardic to 125, hypoxic to 70s on RA, hypotensive with BP 91/45 and altered. Labs were notable for WBC 26, H&H/Plt/INR WNL, Na 134, K 2.7, BUN 89/Cr 2.86, Tbili 2.2, LA 2.8, BNP 276, Trop 53. VBG demonstrated pH 7.398, pCO2 50.7. UA was unremarkable and BCx were obtained. CT Head NAICA, +atrophy and chronic microvascular disease. CT Chest/A/P without active CP disease, changes c/w cirrhosis, sigmoid diverticulitis and anasarca. XR of L knee was negative for acute abnormality and XR bilateral feet were obtained (L negative, R ?gouty changes at base of proximal phalanx of 1st digit, no definite osteomyelitis). Broad spectrum antibiotics were initiated and peripheral NE started for pressor support.  Given patient's ongoing hypotension, fever and AMS with concern for septic shock, patient was transferred to [facility] for further evaluation and workup. PCCM consulted for admission.  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   Anxiety    Arthritis    Asthma    Atrial fibrillation ([place])    CAP (community acquired pneumonia) 05/31/2015   Cataract    CHF (congestive heart failure) (HCC)    Chronic bronchitis (HCC)    Chronic kidney disease    Clotting disorder ([place])    Community acquired pneumonia 05/31/2015   Depression    Dysrhythmia    GERD (gastroesophageal reflux disease)    High cholesterol    History of hiatal hernia    HTN (hypertension)    Mild aortic sclerosis    Morbid obesity (HCC)    Neuromuscular disorder (HCC)    Neuropathic pain    Osteoporosis    Persistent atrial fibrillation (HCC)    Scoliosis    Sleep apnea    Spinal stenosis    Thyroid disease    Type II diabetes mellitus ([place])    Vitamin D deficiency    Significant Hospital Events: Including procedures, antibiotic start and stop dates in addition to other pertinent events   8/8 - Presented to [facility] for AMS, fatigue; febrile/tachy/hypoxic with soft BP, pressors/abx initiated. CT Head negative, CT Chest/A/P with cirrhosis/sigmoid diverticulosis (otherwise unremarkable), XR R foot with ?gouty disease, XR L foot/L knee negative. Transferred to [facility]. PCCM consulted for admission. 8/9: weaned off levo; mental status slowly improving  Interim History / Subjective:  Patient weaned off levo Still somewhat altered but able to follow commands and states name and knows where she is On 4L [place]  Objective:  Blood pressure 100/62, pulse 87, temperature 97.8 F (36.6 C),  temperature source Axillary, resp. rate 12, height '5\' 9"'$  (1.753 m), weight (!) 136.7 kg, SpO2 99 %.        Intake/Output Summary (Last 24 hours) at 10/29/2021 0759 Last data filed at 10/29/2021 0600 Gross per 24 hour  Intake 4885.51 ml  Output 100 ml  Net 4785.51 ml    Filed Weights   10/28/21 1911 10/29/21 0111  Weight: 132.5 kg (!) 136.7 kg   Physical  Examination: General:   ill appearing female HEENT: MM pink/moist; [place] in place Neuro: altered but wakes up to state name, place, year; PERRL CV: s1s2, afib rate 80s PULM:  dim clear BS bilaterally; [place] 4L GI: soft, bsx4 active  Extremities: warm/dry, BLE edema; BLE small feet ulcer; left knee with mild swelling, no pain to palpation, no redness or warmth, no drainage  Resolved Hospital Problem List:    Assessment & Plan:  Sepsis with likely septic shock, unclear source at present P: -weaned of levo -check CXR -f/u cultures -hold further IVF given hx of chf -trend wbc/fever curve -continue cefepime/vanc -check MRI of b/l feet and left knee to rule out source of infection  Acute encephalopathy -ct head no acute findings; ammonia 38, UA wnl P: -likely sepsis related -check tsh -cont abx and f/u cultures  Longstanding ulcerations of BLE (feet) L knee wound, nonhealing: underwent drainage of left knee hematoma and placement of drain 3/20203 by Dr. Erin Hearing. -concern for possible osteomyelitis of feet or septic arthritis or cellulitis of knee -XR without any signs of infection -Patient recently treated with doxy for knee a week ago for possible cellulitis P: -WOC following -MRI pf b/l feet and left knee  Hypokalemia AKI on CKD stage IV P: -repleted K; repeat bmp pending -Trend BMP / urinary output -Replace electrolytes as indicated -Avoid nephrotoxic agents, ensure adequate renal perfusion  Hypoglycemia T2DM Peripheral neuropathy P: -given dextrose this am -hold SSI for now -continue cbg monitoring -advance diet when less encephalopathic -hold cymbalta and lyrica while altered  Acute hypoxemic respiratory failure/insufficiency COPD with chronic bronchitis OSA: no cpap at home P: -continue to wean O2 for sats >92% -cpap qhs when less altered and safe to use -prn xopenex for wheezing  Atrial fibrillation History of persistent AF. Previously on Eliquis. Hx of  amio toxicity in 2017 P: -telemetry monitoring -has been off eliquis for a week per daughter at bedside due to hemoptysis and bleeding from knee. DIC panel negative. Seen by hematology outpt. -consider heparin gtt; currently on heparin subq -hold metoprolol given hypotension; consider resuming tomorrow in am  CHF Cardiac amyloidosis, TTR Pulm HTN Mild AS HTN HLD Echo 08/2021 with EF 55-60%, no RWMAs, mild LVH, D-shaped septum in diastole c/w severely elevated PASP, reduced RV function. P: -cards consulted -strict I/o's; daily weights -hold home metoprolol and metolazone -consider lasix as bp improves  GERD P: - PPI when able to take PO  Gout P: - holding allopurinol, colchicine while npo - Consider joint fluid sampling if able   Hypothyroidism P: - Resume home synthroid when able to take po  Depression Anxiety P: - hold xanax  Best Practice: (right click and "Reselect all SmartList Selections" daily)   Diet/type: NPO DVT prophylaxis: prophylactic heparin  GI prophylaxis: N/A; ppi when able Lines: N/A Foley:  N/A Code Status:  limited - DNR/DNI, would accept BiPAP/ACLS meds/defib/[place], no CPR/intubation Last date of multidisciplinary goals of care discussion [8/9 spoke with daughter at bedside and updated on plan of care]  Labs:  CBC:  Recent Labs  Lab 10/28/21 1920 10/28/21 1923 10/29/21 0201 10/29/21 0439  WBC 26.0*  --  40.6* 43.8*  NEUTROABS 24.0*  --  37.3*  --   HGB 11.8* 14.6 9.3* 10.0*  HCT 36.6 43.0 29.0* 30.0*  MCV 88.8  --  89.5 88.0  PLT 192  --  138* 142*    Basic Metabolic Panel: Recent Labs  Lab 10/28/21 1920 10/28/21 1923 10/28/21 1944 10/29/21 0439  NA 134* 135  --  134*  K 2.7* 2.7*  --  3.0*  CL 91*  --   --  93*  CO2 28  --   --  27  GLUCOSE 117*  --   --  82  BUN 89*  --   --  85*  CREATININE 2.86*  --   --  2.87*  CALCIUM 9.6  --   --  8.8*  MG  --   --  2.4 2.3  PHOS  --   --   --  4.8*    GFR: Estimated Creatinine  Clearance: 25.6 mL/min (A) (by C-G formula based on SCr of 2.87 mg/dL (H)). Recent Labs  Lab 10/28/21 1920 10/28/21 2139 10/29/21 0201 10/29/21 0439  PROCALCITON  --   --   --  18.87  WBC 26.0*  --  40.6* 43.8*  LATICACIDVEN 2.8* 2.4* 4.2* 1.6    Liver Function Tests: Recent Labs  Lab 10/28/21 1920 10/29/21 0439  AST 21 16  ALT 9 9  ALKPHOS 110 76  BILITOT 2.2* 2.1*  PROT 7.8 5.7*  ALBUMIN 4.0 2.9*    No results for input(s): "LIPASE", "AMYLASE" in the last 168 hours. Recent Labs  Lab 10/28/21 2139  AMMONIA 38*    ABG:    Component Value Date/Time   HCO3 27.3 10/29/2021 0439   TCO2 32 10/28/2021 1923   O2SAT 93.2 10/29/2021 0439    Coagulation Profile: Recent Labs  Lab 10/28/21 1920  INR 1.2    Cardiac Enzymes: No results for input(s): "CKTOTAL", "CKMB", "CKMBINDEX", "TROPONINI" in the last 168 hours.  HbA1C: Hgb A1c MFr Bld  Date/Time Value Ref Range Status  09/11/2021 05:30 PM 7.0 (H) 4.8 - 5.6 % Final    Comment:    (NOTE) Pre diabetes:          5.7%-6.4%  Diabetes:              >6.4%  Glycemic control for   <7.0% adults with diabetes   01/07/2021 10:28 AM 10.2 (H) 4.8 - 5.6 % Final    Comment:             Prediabetes: 5.7 - 6.4          Diabetes: >6.4          Glycemic control for adults with diabetes: <7.0    CBG: Recent Labs  Lab 10/28/21 1908 10/29/21 0346  GLUCAP 111* 83    Review of Systems:   Review of systems completed with pertinent positives/negatives outlined in above HPI.  Past Medical History:  She,  has a past medical history of Allergy, Amiodarone toxicity (01/16/2016), Anxiety, Arthritis, Asthma, Atrial fibrillation ([place]), CAP (community acquired pneumonia) (05/31/2015), Cataract, CHF (congestive heart failure) ([place]), Chronic bronchitis ([place]), Chronic kidney disease, Clotting disorder ([place]), Community acquired pneumonia (05/31/2015), Depression, Dysrhythmia, GERD (gastroesophageal reflux disease), High cholesterol,  History of hiatal hernia, HTN (hypertension), Mild aortic sclerosis, Morbid obesity ([place]), Neuromuscular disorder ([place]), Neuropathic pain, Osteoporosis, Persistent atrial fibrillation ([place]), Scoliosis, Sleep apnea, Spinal stenosis,  Thyroid disease, Type II diabetes mellitus ([place]), and Vitamin D deficiency.   Surgical History:   Past Surgical History:  Procedure Laterality Date   BLADDER SUSPENSION  1980s   CARDIAC CATHETERIZATION  11/16/09   SMOOTH AND NORMAL   CARDIOVERSION N/A 01/07/2015   Procedure: CARDIOVERSION;  Surgeon: Skeet Latch, MD;  Location: [facility] ENDOSCOPY;  Service: Cardiovascular;  Laterality: N/A;   CARDIOVERSION N/A 03/11/2015   Procedure: CARDIOVERSION;  Surgeon: Skeet Latch, MD;  Location: [facility];  Service: Cardiovascular;  Laterality: N/A;   CARPAL TUNNEL RELEASE Left 1970s   CATARACT EXTRACTION W/ INTRAOCULAR LENS  IMPLANT, BILATERAL Bilateral ~ 2010   COLONOSCOPY  08/14/08   FINGER FRACTURE SURGERY Right 1970s   "ring finger"   FRACTURE SURGERY     HEMATOMA EVACUATION Left 05/21/2021   Procedure: EVACUATION LEFT KNEE HEMATOMA;  Surgeon: Lennice Sites, MD;  Location: [place];  Service: Plastics;  Laterality: Left;   LAPAROSCOPIC CHOLECYSTECTOMY  1980s   TUBAL LIGATION     VAGINAL HYSTERECTOMY  1980   Social History:   reports that she has never smoked. She has never used smokeless tobacco. She reports that she does not drink alcohol and does not use drugs.   Family History:  Her family history includes Atrial fibrillation in her father; CAD in her brother and sister; Congestive Heart Failure in her mother; Heart failure in her mother; Hypertension in her brother, father, mother, sister, and son; Stroke in her father. There is no history of Colon cancer, Esophageal cancer, Stomach cancer, or Rectal cancer.   Allergies: Allergies  Allergen Reactions   Albuterol Other (See Comments)    REACTION: Tachycardia- AFib   Epinephrine Other (See Comments)     Increases heart rate per patient.    Penicillins Other (See Comments)    REACTION: flushing' \\T'$ \ hot  Did it involve swelling of the face/tongue/throat, SOB, or low BP? No Did it involve sudden or severe rash/hives, skin peeling, or any reaction on the inside of your mouth or nose? No Did you need to seek medical attention at a hospital or doctor's office? No When did it last happen?    20 years ago   If all above answers are "NO", may proceed with cephalosporin use.   Ceftriaxone Other (See Comments)    Sweats   Citalopram Hydrobromide Other (See Comments)    Prolonged QT prolongation   Exenatide Other (See Comments)    Unknown reaction   Ketorolac Tromethamine Hives and Swelling    swelling in hands   Tizanidine Other (See Comments)    lethargic   Torsemide Swelling   Zoledronic Acid Other (See Comments)    Pt was hospitalized due to medication   Bee Venom Other (See Comments)    "makes me nervous"   Ivp Dye [Iodinated Contrast Media] Other (See Comments)    flushing   Keflex [Cephalexin] Other (See Comments)    Makes her feel warm; she was told never to take it.    Home Medications: Prior to Admission medications   Medication Sig Start Date End Date Taking? Authorizing Provider  acetaminophen (TYLENOL) 500 MG tablet Take 1,000 mg by mouth every 6 (six) hours as needed for moderate pain or headache.    [provider]  allopurinol (ZYLOPRIM) 300 MG tablet Take 300 mg by mouth in the morning.    [provider]  ALPRAZolam Duanne Moron) 0.5 MG tablet Take 0.5 mg by mouth every 6 (six) hours as needed for anxiety.  [provider]  apixaban (ELIQUIS) 2.5 MG TABS tablet Take 1 tablet (2.5 mg total) by mouth 2 (two) times daily. 10/15/21   Alla Feeling, NP  BD PEN NEEDLE NANO U/F 32G X 4 MM MISC Inject 1 each as directed See admin instructions. Use pen needles with insulin pens daily 12/24/14   [provider]  colchicine 0.6 MG tablet Take 0.6 mg by  mouth daily as needed (gout flares). 04/07/21   [provider]  diclofenac Sodium (VOLTAREN) 1 % GEL Apply 4 g topically 4 (four) times daily as needed (back pain). 09/19/21   Barton Dubois, MD  docusate sodium (COLACE) 100 MG capsule Take 100 mg by mouth daily as needed for mild constipation.    [provider]  DULoxetine (CYMBALTA) 30 MG capsule Take 30 mg by mouth in the morning. 09/26/20   [provider]  estradiol (ESTRACE) 0.1 MG/GM vaginal cream Place 1 Applicatorful vaginally as needed (vaginal irritation). 12/18/16   [provider]  furosemide (LASIX) 80 MG tablet TAKE 1 AND 1/2 TABLETS BY MOUTH TWICE DAILY 10/06/21   Bensimhon, Shaune Pascal, MD  HYDROcodone-acetaminophen (NORCO/VICODIN) 5-325 MG tablet Take 1 tablet by mouth every 6 (six) hours as needed for severe pain. 04/09/21   Etta Quill, NP  insulin aspart (NOVOLOG FLEXPEN) 100 UNIT/ML FlexPen 40-70 Units See admin instructions. Less than 150 units 150=40 units151-199=45 units 200-249=50 units 250-299=55 units 300-349=60 units    [provider]  Insulin Degludec (TRESIBA FLEXTOUCH) 200 UNIT/ML SOPN Inject 100 Units into the skin in the morning and at bedtime.    [provider]  levothyroxine (SYNTHROID) 88 MCG tablet Take 88 mcg by mouth every morning. 08/14/21   [provider]  lidocaine-prilocaine (EMLA) cream 1 gram qid prn 01/07/21   Marcial Pacas, MD  LYRICA 50 MG capsule Take 50 mg by mouth 2 (two) times daily. 06/01/17   [provider]  metolazone (ZAROXOLYN) 2.5 MG tablet Take 1 tablet (2.5 mg total) by mouth 3 (three) times a week. Mon, Wed, Fri. 09/22/21   Barton Dubois, MD  metoprolol succinate (TOPROL-XL) 50 MG 24 hr tablet Take 1/2 tablet (25 mg total) by mouth daily. 09/19/21   Barton Dubois, MD  nystatin (MYCOSTATIN/NYSTOP) powder Apply 1 application topically as needed (skin irritation/rash). 01/13/19   [provider]  ondansetron (ZOFRAN) 4 MG  tablet Take 1 tablet (4 mg total) by mouth every 8 (eight) hours as needed for nausea or vomiting. 05/21/21   Scheeler, Carola Rhine, PA-C  Polyethyl Glycol-Propyl Glycol (SYSTANE OP) Place 1 drop into both eyes 2 (two) times daily as needed (dry eyes).    [provider]  polyethylene glycol (MIRALAX / GLYCOLAX) 17 g packet Take 17 g by mouth daily as needed (constipation.).    [provider]  potassium chloride SA (KLOR-CON M) 20 MEQ tablet Take 3 tablets (60 mEq total) by mouth every morning AND 2 tablets (40 mEq total) every evening. 10/03/21   Rosita Fire, Brittainy M, PA-C  REGRANEX 0.01 % gel Apply 1 Application topically daily. Apply to left foot 09/09/21   [provider]  traMADol (ULTRAM) 50 MG tablet Take 50 mg by mouth 3 (three) times daily as needed (severe neuropathy pain.). 06/24/19   [provider]  Vitamin D, Ergocalciferol, (DRISDOL) 50000 UNITS CAPS Take 50,000 Units by mouth every Monday.    [provider]   Critical care time: 40 minutes   Mick Sell, PA-C [facility] 10/29/21 7:59 AM  Please see [email or web address] for pager details.  From 7A-7P if no response, please call [phone] After hours, please call ELink [phone]

## 2021-10-29 NOTE — Progress Notes (Signed)
Patient arrived from 100M accompanied by RN and 1 tech, she is alert and verbally responsive, denies pain. PA Rollene Rotunda notified of finding to patients left inner this possible rash (area reddened) he came to evaluate patient and this RN marked area. Oncoming RN made aware and viewed area upon rounding. Patient has call bell within reach without any sign of distress, multiple family members at bedside.

## 2021-10-29 NOTE — Progress Notes (Signed)
An USGPIV (ultrasound guided PIV) has been placed for short-term vasopressor infusion. A correctly placed ivWatch must be used when administering Vasopressors. Should this treatment be needed beyond 72 hours, central line access should be obtained.  It will be the responsibility of the bedside nurse to follow best practice to prevent extravasations.   ?

## 2021-10-29 NOTE — Progress Notes (Signed)
Gold wedding band given to husband to take home.   Lesle Reek, RN

## 2021-10-29 NOTE — H&P (Addendum)
NAME:  Kristine [name], MRN:  [id number], DOB:  1946/02/10, LOS: 0 ADMISSION DATE:  10/28/2021 CONSULTATION DATE:  10/29/2021 REFERRING MD:  [facility] - EDP CHIEF COMPLAINT:  Sepsis   History of Present Illness:  76 year old woman who presented to [facility] 8/8 with fatigue and AMS. PMHx significant for HTN, HLD, AF (previously on Eliquis), mild AS, CHF with cardiac amyloidosis (TTR, diagnosed 10/2021), COPD with chronic bronchitis, OSA, T2DM, hypothyroidism, CKD stage IV, GERD, depression/anxiety.  Per chart review/patient's husband, patient returned from [place] scan 8/8 and appeared extremely weak/fatigued and unable to get from the car to the house. Shortly after arriving home, patient c/o chills/being "freezing" and progressively became altered with increasing confusion and tachypnea. Tmax 101F measured at home and Tylenol was given. Of note, patient has had longstanding sores on bilateral feet (slow to heal in the setting of DM) and L knee (occurred s/p hematoma drainage).  On ED arrival, patient was febrile to 101.37F, tachycardic to 125, hypoxic to 70s on RA, hypotensive with BP 91/45 and altered. Labs were notable for WBC 26, H&H/Plt/INR WNL, Na 134, K 2.7, BUN 89/Cr 2.86, Tbili 2.2, LA 2.8, BNP 276, Trop 53. VBG demonstrated pH 7.398, pCO2 50.7. UA was unremarkable and BCx were obtained. CT Head NAICA, +atrophy and chronic microvascular disease. CT Chest/A/P without active CP disease, changes c/w cirrhosis, sigmoid diverticulitis and anasarca. XR of L knee was negative for acute abnormality and XR bilateral feet were obtained (L negative, R ?gouty changes at base of proximal phalanx of 1st digit, no definite osteomyelitis). Broad spectrum antibiotics were initiated and peripheral NE started for pressor support.  Given patient's ongoing hypotension, fever and AMS with concern for septic shock, patient was transferred to [facility] for further evaluation and workup. PCCM consulted for admission.  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   Anxiety    Arthritis    Asthma    Atrial fibrillation ([place])    CAP (community acquired pneumonia) 05/31/2015   Cataract    CHF (congestive heart failure) (HCC)    Chronic bronchitis (HCC)    Chronic kidney disease    Clotting disorder ([place])    Community acquired pneumonia 05/31/2015   Depression    Dysrhythmia    GERD (gastroesophageal reflux disease)    High cholesterol    History of hiatal hernia    HTN (hypertension)    Mild aortic sclerosis    Morbid obesity (HCC)    Neuromuscular disorder (HCC)    Neuropathic pain    Osteoporosis    Persistent atrial fibrillation (HCC)    Scoliosis    Sleep apnea    Spinal stenosis    Thyroid disease    Type II diabetes mellitus ([place])    Vitamin D deficiency    Significant Hospital Events: Including procedures, antibiotic start and stop dates in addition to other pertinent events   8/8 - Presented to [facility] for AMS, fatigue; febrile/tachy/hypoxic with soft BP, pressors/abx initiated. CT Head negative, CT Chest/A/P with cirrhosis/sigmoid diverticulosis (otherwise unremarkable), XR R foot with ?gouty disease, XR L foot/L knee negative. Transferred to [facility]. PCCM consulted for admission.  Interim History / Subjective:  As above  Objective:  Blood pressure 109/61, pulse (!) 115, temperature 98.5 F (36.9 C), temperature source Axillary, resp. rate 15, height '5\' 9"'$  (1.753 m), weight 132.5 kg, SpO2 96 %.        Intake/Output Summary (Last 24  hours) at 10/29/2021 0108 Last data filed at 10/28/2021 2349 Gross per 24 hour  Intake 1800 ml  Output --  Net 1800 ml   Filed Weights   10/28/21 1911  Weight: 132.5 kg   Physical Examination: General: Acute-on-chronically ill-appearing elderly woman in NAD. Drowsy, lethargic. HEENT: [place]/AT, anicteric sclera, PERRL, dry mucous membranes. Neuro: Lethargic. Responds to verbal stimuli, albeit quite slow  to respond. Following commands intermittently. Moves all 4 extremities spontaneously. CV: Irregularly irregular rhythm, rate 90s, no m/g/r. PULM: Breathing even and unlabored on 3LNC. Lung fields diminished at bilateral bases. GI: Obese, soft, nontender, nondistended. Hypoactive bowel sounds. Intertrigo noted to folds of lower quadrants with +erythema, nystatin powder. Extremities: Bilateral symmetric 1+ LE edema noted. Skin: Warm/dry, BLE changes c/w venous stasis.  Resolved Hospital Problem List:    Assessment & Plan:  Sepsis with likely septic shock, unclear source at present - Admit to [facility] - Goal MAP > 65 - Fluid resuscitation as tolerated, judicious use in the setting of CHF - Levophed titrated to goal MAP - Trend WBC, fever curve, LA - F/u Cx data - Continue broad-spectrum antibiotics (cefepime/vanc, flagyl)  Longstanding ulcerations of BLE (feet) L knee wound, nonhealing AKI on CKD stage IV - Trend BMP - Replete electrolytes as indicated - Monitor I&Os - Avoid nephrotoxic agents as able - Ensure adequate renal perfusion  T2DM - Basal Tresiba at home (100U AM, 100U PM)  - SSI - CBGs Q4H - Goal CBG 140-180  Acute hypoxemic respiratory failure/insufficiency COPD with chronic bronchitis OSA - Continue supplemental O2 support - Wean O2 for sat > 90% - BiPAP PRN, CPAP QHS when able to tolerate - Bronchodilators PRN - Pulmonary hygiene - Repeat VBG - Daily CXR  Atrial fibrillation History of persistent AF. Previously on Eliquis. - Discuss AC risk/benefit - Currently rate controlled - Resume metoprolol as BP tolerates - Cardiac monitoring  CHF Cardiac amyloidosis, TTR Mild AS Echo 08/2021 with EF 55-60%, no RWMAs, mild LVH, D-shaped septum in diastole c/w severely elevated PASP, reduced RV function. - Gentle diuresis as tolerated - Home metolazone, Lasix adjusted for hypotension - Monitor I&Os - Consider repeat Echo - Cards consult while in house  for further management  HTN HLD Home regimen includes: Toprol XL '25mg'$  daily. - Cardiac monitoring - Hold Tpprol in the setting of soft BP, pressor requirement  AKI on CKD stage IV - Trend BMP - Replete electrolytes as indicated - Monitor I&Os - Diuresis as above (metolazone, furosemide - Avoid nephrotoxic agents as able - Ensure adequate renal perfusion  T2DM - Basal 100U  - SSI - CBGs Q4H - Goal CBG 140-180  GERD - PPI  Gout - Resume home allopurinol, colchicine as clinically appropriate - Consider joint fluid sampling if able   Hypothyroidism - Resume home synthroid  Depression Anxiety - Resume home Cymbalta - Xanax PRN  Best Practice: (right click and "Reselect all SmartList Selections" daily)   Diet/type: NPO DVT prophylaxis: SCDs, SQH GI prophylaxis: PPI Lines: N/A Foley:  N/A Code Status:  limited - DNR/DNI, would accept BiPAP/ACLS meds/defib/[place], no CPR/intubation Last date of multidisciplinary goals of care discussion [Pending]  Labs:  CBC: Recent Labs  Lab 10/28/21 1920 10/28/21 1923  WBC 26.0*  --   NEUTROABS 24.0*  --   HGB 11.8* 14.6  HCT 36.6 43.0  MCV 88.8  --   PLT 192  --    Basic Metabolic Panel: Recent Labs  Lab 10/28/21 1920 10/28/21 1923 10/28/21 1944  NA 134* 135  --   K 2.7* 2.7*  --   CL 91*  --   --   CO2 28  --   --   GLUCOSE 117*  --   --   BUN 89*  --   --   CREATININE 2.86*  --   --   CALCIUM 9.6  --   --   MG  --   --  2.4   GFR: Estimated Creatinine Clearance: 25.3 mL/min (A) (by C-G formula based on SCr of 2.86 mg/dL (H)). Recent Labs  Lab 10/28/21 1920 10/28/21 2139  WBC 26.0*  --   LATICACIDVEN 2.8* 2.4*   Liver Function Tests: Recent Labs  Lab 10/28/21 1920  AST 21  ALT 9  ALKPHOS 110  BILITOT 2.2*  PROT 7.8  ALBUMIN 4.0   No results for input(s): "LIPASE", "AMYLASE" in the last 168 hours. Recent Labs  Lab 10/28/21 2139  AMMONIA 38*   ABG:    Component Value Date/Time   HCO3 30.5  (H) 10/28/2021 1923   TCO2 32 10/28/2021 1923   O2SAT 71 10/28/2021 1923    Coagulation Profile: Recent Labs  Lab 10/28/21 1920  INR 1.2   Cardiac Enzymes: No results for input(s): "CKTOTAL", "CKMB", "CKMBINDEX", "TROPONINI" in the last 168 hours.  HbA1C: Hgb A1c MFr Bld  Date/Time Value Ref Range Status  09/11/2021 05:30 PM 7.0 (H) 4.8 - 5.6 % Final    Comment:    (NOTE) Pre diabetes:          5.7%-6.4%  Diabetes:              >6.4%  Glycemic control for   <7.0% adults with diabetes   01/07/2021 10:28 AM 10.2 (H) 4.8 - 5.6 % Final    Comment:             Prediabetes: 5.7 - 6.4          Diabetes: >6.4          Glycemic control for adults with diabetes: <7.0    CBG: Recent Labs  Lab 10/28/21 1908  GLUCAP 111*   Review of Systems:   Review of systems completed with pertinent positives/negatives outlined in above HPI.  Past Medical History:  She,  has a past medical history of Allergy, Amiodarone toxicity (01/16/2016), Anxiety, Arthritis, Asthma, Atrial fibrillation ([place]), CAP (community acquired pneumonia) (05/31/2015), Cataract, CHF (congestive heart failure) ([place]), Chronic bronchitis ([place]), Chronic kidney disease, Clotting disorder ([place]), Community acquired pneumonia (05/31/2015), Depression, Dysrhythmia, GERD (gastroesophageal reflux disease), High cholesterol, History of hiatal hernia, HTN (hypertension), Mild aortic sclerosis, Morbid obesity ([place]), Neuromuscular disorder ([place]), Neuropathic pain, Osteoporosis, Persistent atrial fibrillation ([place]), Scoliosis, Sleep apnea, Spinal stenosis, Thyroid disease, Type II diabetes mellitus ([place]), and Vitamin D deficiency.   Surgical History:   Past Surgical History:  Procedure Laterality Date   BLADDER SUSPENSION  1980s   CARDIAC CATHETERIZATION  11/16/09   SMOOTH AND NORMAL   CARDIOVERSION N/A 01/07/2015   Procedure: CARDIOVERSION;  Surgeon: Skeet Latch, MD;  Location: [facility] ENDOSCOPY;  Service: Cardiovascular;   Laterality: N/A;   CARDIOVERSION N/A 03/11/2015   Procedure: CARDIOVERSION;  Surgeon: Skeet Latch, MD;  Location: [facility];  Service: Cardiovascular;  Laterality: N/A;   CARPAL TUNNEL RELEASE Left 1970s   CATARACT EXTRACTION W/ INTRAOCULAR LENS  IMPLANT, BILATERAL Bilateral ~ 2010   COLONOSCOPY  08/14/08   FINGER FRACTURE SURGERY Right 1970s   "ring finger"   FRACTURE SURGERY     HEMATOMA  EVACUATION Left 05/21/2021   Procedure: EVACUATION LEFT KNEE HEMATOMA;  Surgeon: Lennice Sites, MD;  Location: [facility];  Service: Plastics;  Laterality: Left;   LAPAROSCOPIC CHOLECYSTECTOMY  1980s   TUBAL LIGATION     VAGINAL HYSTERECTOMY  1980   Social History:   reports that she has never smoked. She has never used smokeless tobacco. She reports that she does not drink alcohol and does not use drugs.   Family History:  Her family history includes Atrial fibrillation in her father; CAD in her brother and sister; Congestive Heart Failure in her mother; Heart failure in her mother; Hypertension in her brother, father, mother, sister, and son; Stroke in her father. There is no history of Colon cancer, Esophageal cancer, Stomach cancer, or Rectal cancer.   Allergies: Allergies  Allergen Reactions   Albuterol Other (See Comments)    REACTION: Tachycardia- AFib   Epinephrine Other (See Comments)    Increases heart rate per patient.    Penicillins Other (See Comments)    REACTION: flushing' \\T'$ \ hot  Did it involve swelling of the face/tongue/throat, SOB, or low BP? No Did it involve sudden or severe rash/hives, skin peeling, or any reaction on the inside of your mouth or nose? No Did you need to seek medical attention at a hospital or doctor's office? No When did it last happen?    20 years ago   If all above answers are "NO", may proceed with cephalosporin use.   Ceftriaxone Other (See Comments)    Sweats   Citalopram Hydrobromide Other (See Comments)    Prolonged QT prolongation   Exenatide  Other (See Comments)    Unknown reaction   Ketorolac Tromethamine Hives and Swelling    swelling in hands   Tizanidine Other (See Comments)    lethargic   Torsemide Swelling   Zoledronic Acid Other (See Comments)    Pt was hospitalized due to medication   Bee Venom Other (See Comments)    "makes me nervous"   Ivp Dye [Iodinated Contrast Media] Other (See Comments)    flushing   Keflex [Cephalexin] Other (See Comments)    Makes her feel warm; she was told never to take it.    Home Medications: Prior to Admission medications   Medication Sig Start Date End Date Taking? Authorizing Provider  acetaminophen (TYLENOL) 500 MG tablet Take 1,000 mg by mouth every 6 (six) hours as needed for moderate pain or headache.    [provider]  allopurinol (ZYLOPRIM) 300 MG tablet Take 300 mg by mouth in the morning.    [provider]  ALPRAZolam Duanne Moron) 0.5 MG tablet Take 0.5 mg by mouth every 6 (six) hours as needed for anxiety.    [provider]  apixaban (ELIQUIS) 2.5 MG TABS tablet Take 1 tablet (2.5 mg total) by mouth 2 (two) times daily. 10/15/21   Alla Feeling, NP  BD PEN NEEDLE NANO U/F 32G X 4 MM MISC Inject 1 each as directed See admin instructions. Use pen needles with insulin pens daily 12/24/14   [provider]  colchicine 0.6 MG tablet Take 0.6 mg by mouth daily as needed (gout flares). 04/07/21   [provider]  diclofenac Sodium (VOLTAREN) 1 % GEL Apply 4 g topically 4 (four) times daily as needed (back pain). 09/19/21   Barton Dubois, MD  docusate sodium (COLACE) 100 MG capsule Take 100 mg by mouth daily as needed for mild constipation.    [provider]  DULoxetine (  CYMBALTA) 30 MG capsule Take 30 mg by mouth in the morning. 09/26/20   [provider]  estradiol (ESTRACE) 0.1 MG/GM vaginal cream Place 1 Applicatorful vaginally as needed (vaginal irritation). 12/18/16   [provider]  furosemide (LASIX) 80 MG  tablet TAKE 1 AND 1/2 TABLETS BY MOUTH TWICE DAILY 10/06/21   Bensimhon, Shaune Pascal, MD  HYDROcodone-acetaminophen (NORCO/VICODIN) 5-325 MG tablet Take 1 tablet by mouth every 6 (six) hours as needed for severe pain. 04/09/21   Etta Quill, NP  insulin aspart (NOVOLOG FLEXPEN) 100 UNIT/ML FlexPen 40-70 Units See admin instructions. Less than 150 units 150=40 units151-199=45 units 200-249=50 units 250-299=55 units 300-349=60 units    [provider]  Insulin Degludec (TRESIBA FLEXTOUCH) 200 UNIT/ML SOPN Inject 100 Units into the skin in the morning and at bedtime.    [provider]  levothyroxine (SYNTHROID) 88 MCG tablet Take 88 mcg by mouth every morning. 08/14/21   [provider]  lidocaine-prilocaine (EMLA) cream 1 gram qid prn 01/07/21   Marcial Pacas, MD  LYRICA 50 MG capsule Take 50 mg by mouth 2 (two) times daily. 06/01/17   [provider]  metolazone (ZAROXOLYN) 2.5 MG tablet Take 1 tablet (2.5 mg total) by mouth 3 (three) times a week. Mon, Wed, Fri. 09/22/21   Barton Dubois, MD  metoprolol succinate (TOPROL-XL) 50 MG 24 hr tablet Take 1/2 tablet (25 mg total) by mouth daily. 09/19/21   Barton Dubois, MD  nystatin (MYCOSTATIN/NYSTOP) powder Apply 1 application topically as needed (skin irritation/rash). 01/13/19   [provider]  ondansetron (ZOFRAN) 4 MG tablet Take 1 tablet (4 mg total) by mouth every 8 (eight) hours as needed for nausea or vomiting. 05/21/21   Scheeler, Carola Rhine, PA-C  Polyethyl Glycol-Propyl Glycol (SYSTANE OP) Place 1 drop into both eyes 2 (two) times daily as needed (dry eyes).    [provider]  polyethylene glycol (MIRALAX / GLYCOLAX) 17 g packet Take 17 g by mouth daily as needed (constipation.).    [provider]  potassium chloride SA (KLOR-CON M) 20 MEQ tablet Take 3 tablets (60 mEq total) by mouth every morning AND 2 tablets (40 mEq total) every evening. 10/03/21   Rosita Fire, Brittainy M, PA-C  REGRANEX 0.01  % gel Apply 1 Application topically daily. Apply to left foot 09/09/21   [provider]  traMADol (ULTRAM) 50 MG tablet Take 50 mg by mouth 3 (three) times daily as needed (severe neuropathy pain.). 06/24/19   [provider]  Vitamin D, Ergocalciferol, (DRISDOL) 50000 UNITS CAPS Take 50,000 Units by mouth every Monday.    [provider]   Critical care time: 68 minutes   Lestine Mount, PA-C [facility] 10/29/21 1:08 AM  Please see [email or web address] for pager details.  From 7A-7P if no response, please call [phone] After hours, please call ELink [phone]

## 2021-10-29 NOTE — Progress Notes (Signed)
ANTICOAGULATION CONSULT NOTE - Initial Consult  Pharmacy Consult for Heparin Indication: atrial fibrillation  Allergies  Allergen Reactions   Albuterol Other (See Comments)    REACTION: Tachycardia- AFib   Epinephrine Other (See Comments)    Increases heart rate per patient.    Penicillins Other (See Comments)    REACTION: flushing' \\T'$ \ hot  Did it involve swelling of the face/tongue/throat, SOB, or low BP? No Did it involve sudden or severe rash/hives, skin peeling, or any reaction on the inside of your mouth or nose? No Did you need to seek medical attention at a hospital or doctor's office? No When did it last happen?    20 years ago   If all above answers are "NO", may proceed with cephalosporin use.   Ceftriaxone Other (See Comments)    Sweats   Citalopram Hydrobromide Other (See Comments)    Prolonged QT prolongation   Exenatide Other (See Comments)    Unknown reaction   Ketorolac Tromethamine Hives and Swelling    swelling in hands   Tizanidine Other (See Comments)    lethargic   Torsemide Swelling   Zoledronic Acid Other (See Comments)    Pt was hospitalized due to medication   Bee Venom Other (See Comments)    "makes me nervous"   Ivp Dye [Iodinated Contrast Media] Other (See Comments)    flushing   Keflex [Cephalexin] Other (See Comments)    Makes her feel warm; she was told never to take it.    Patient Measurements: Height: '5\' 9"'$  (175.3 cm) Weight: (!) 136.7 kg (301 lb 5.9 oz) IBW/kg (Calculated) : 66.2 Heparin Dosing Weight: 99 kg  Vital Signs: Temp: 97.5 F (36.4 C) (08/09 1230) Temp Source: Oral (08/09 1230) BP: 106/62 (08/09 1200) Pulse Rate: 93 (08/09 1200)  Labs: Recent Labs    10/28/21 1920 10/28/21 1923 10/28/21 1944 10/28/21 1945 10/28/21 2139 10/29/21 0201 10/29/21 0439 10/29/21 1009  HGB 11.8* 14.6  --   --   --  9.3* 10.0*  --   HCT 36.6 43.0  --   --   --  29.0* 30.0*  --   PLT 192  --   --   --   --  138* 142*  --   APTT  --    --  36  --   --   --   --   --   LABPROT 14.8  --   --   --   --   --   --   --   INR 1.2  --   --   --   --   --   --   --   CREATININE 2.86*  --   --   --   --   --  2.87* 2.91*  TROPONINIHS  --   --   --  53* 84*  --   --   --     Estimated Creatinine Clearance: 25.3 mL/min (A) (by C-G formula based on SCr of 2.91 mg/dL (H)).   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   Anxiety    Arthritis    Asthma    Atrial fibrillation ([place])    CAP (community acquired pneumonia) 05/31/2015   Cataract    CHF (congestive heart failure) (HCC)    Chronic bronchitis (HCC)    Chronic kidney disease    Clotting disorder ([place])    Community acquired pneumonia 05/31/2015  Depression    Dysrhythmia    GERD (gastroesophageal reflux disease)    High cholesterol    History of hiatal hernia    HTN (hypertension)    Mild aortic sclerosis    Morbid obesity (HCC)    Neuromuscular disorder (HCC)    Neuropathic pain    Osteoporosis    Persistent atrial fibrillation (HCC)    Scoliosis    Sleep apnea    Spinal stenosis    Thyroid disease    Type II diabetes mellitus (HCC)    Vitamin D deficiency     Medications:  Scheduled:   Chlorhexidine Gluconate Cloth  6 each Topical Daily   furosemide  80 mg Oral BID   [START ON 10/30/2021] heparin  5,000 Units Subcutaneous Q8H   potassium chloride  40 mEq Oral BID   Infusions:   pencillin G potassium IV      Assessment: 76 yo F presents with AMS/sepsis. Pt on Xarelto '15mg'$  in the past but changed to apixaban 2.'5mg'$  po BID back in July - stopped ~1 week ago due to severe hemoptysis and knee hematoma/bleeding. Hgb down to 10, plt down to 142. Has only received SCDs for VTE prophylaxis this admission. Apixaban should not still be affecting heparin levels.  Pharmacy consulted to start heparin for afib on cards recs. May try to resume DOAC if Hgb remains stable.  Goal of Therapy:  Heparin level  0.3-0.7 units/ml Monitor platelets by anticoagulation protocol: Yes   Plan:  D/C Sq heparin Heparin IV bolus 4000 units Heparin gtt at 1500 units/hr Will f/u heparin level in 8 hours Daily heparin level and CBC  Sherlon Handing, PharmD, BCPS Please see amion for complete clinical pharmacist phone list 10/29/2021,3:36 PM

## 2021-10-29 NOTE — Inpatient Diabetes Management (Signed)
Inpatient Diabetes Program Recommendations  AACE/ADA: New Consensus Statement on Inpatient Glycemic Control (2015)  Target Ranges:  Prepandial:   less than 140 mg/dL      Peak postprandial:   less than 180 mg/dL (1-2 hours)      Critically ill patients:  140 - 180 mg/dL   Lab Results  Component Value Date   GLUCAP 110 (H) 10/29/2021   HGBA1C 7.0 (H) 09/11/2021    Review of Glycemic Control  Latest Reference Range & Units 10/28/21 19:08 10/29/21 03:46 10/29/21 08:04 10/29/21 09:20  Glucose-Capillary 70 - 99 mg/dL 111 (H) 83 69 (L) 110 (H)   Diabetes history: DM 2 Outpatient Diabetes medications:  Tresiba 100 units bid Novolog 40-70 units tid with meals Current orders for Inpatient glycemic control:  CBG's ordered q 4 hours-No insulins ordered  Inpatient Diabetes Program Recommendations:    Note low blood sugar this morning.  Patient was on high doses of Tresiba/Novolog prior to admit.  Agree with continued monitoring.  Tyler Aas is long acting basal insulin, so may still be in patient's system.  Will likely need partial doses of insulin resumed eventually during hospitalization. Will follow.  Thanks,  Adah Perl, RN, BC-ADM Inpatient Diabetes Coordinator Pager [phone]  (8a-5p)

## 2021-10-29 NOTE — Consult Note (Signed)
[facility] Nurse Consult Note: Patient receiving care in [facility] [phone] Reason for Consult: see wounds documented Wound type: Left knee discoloration with a 2 x 1.5 area of epidermis removed from the center of the area. Patient states it is painful. Previous history of evacuation of a hematoma to this knee 05/21/21 (Dr. Erin Hearing) Left upper medial thigh: scattered irritant dermatitis of unknown origin Right little toe has a 0.3 x 0.3 scabbed lesion on the lateral side.  Left base of the great toe 1 x 1 callused area with no signs of infection.  Pressure Injury POA: Yes/No/NA ICD-10 CM Codes for Irritant Dermatitis L24A0 - Due to friction or contact with body fluids; unspecified Dressing procedure/placement/frequency: Left knee: Apply Xeroform gauze over the and cover with a foam dressing. Change Xeroform daily. Left upper medial thigh: After cleansing apply a layer of Aquaphor twice daily. Right little toe requires no treatment at this time.  Left base of the great toe: Clean with soap and water, rinse and pat dry then apply Aquaphor over the callused area and cover with a foam dressing.  Apply heel foam dressings bilaterally and Prevalon boots.   Monitor the wound area(s) for worsening of condition such as: Signs/symptoms of infection, increase in size, development of or worsening of odor, development of pain, or increased pain at the affected locations.   Notify the medical team if any of these develop.  Thank you for the consult. [facility] nurse will not follow at this time.   Please re-consult the [place] team if needed.  Cathlean Marseilles Tamala Julian, MSN, RN, [place], Lysle Pearl, [facility] Wound Treatment Associate Pager [phone]

## 2021-10-30 DIAGNOSIS — B951 Streptococcus, group B, as the cause of diseases classified elsewhere: Secondary | ICD-10-CM | POA: Diagnosis not present

## 2021-10-30 DIAGNOSIS — I4891 Unspecified atrial fibrillation: Secondary | ICD-10-CM | POA: Diagnosis not present

## 2021-10-30 DIAGNOSIS — A419 Sepsis, unspecified organism: Secondary | ICD-10-CM | POA: Diagnosis not present

## 2021-10-30 DIAGNOSIS — J9601 Acute respiratory failure with hypoxia: Secondary | ICD-10-CM | POA: Diagnosis not present

## 2021-10-30 DIAGNOSIS — R7881 Bacteremia: Secondary | ICD-10-CM | POA: Diagnosis not present

## 2021-10-30 DIAGNOSIS — R6521 Severe sepsis with septic shock: Secondary | ICD-10-CM | POA: Diagnosis not present

## 2021-10-30 DIAGNOSIS — I50810 Right heart failure, unspecified: Secondary | ICD-10-CM | POA: Diagnosis not present

## 2021-10-30 LAB — CBC
HCT: 32.4 % — ABNORMAL LOW (ref 36.0–46.0)
Hemoglobin: 10.7 g/dL — ABNORMAL LOW (ref 12.0–15.0)
MCH: 28.9 pg (ref 26.0–34.0)
MCHC: 33 g/dL (ref 30.0–36.0)
MCV: 87.6 fL (ref 80.0–100.0)
Platelets: 141 10*3/uL — ABNORMAL LOW (ref 150–400)
RBC: 3.7 MIL/uL — ABNORMAL LOW (ref 3.87–5.11)
RDW: 16.6 % — ABNORMAL HIGH (ref 11.5–15.5)
WBC: 24.3 10*3/uL — ABNORMAL HIGH (ref 4.0–10.5)
nRBC: 0 % (ref 0.0–0.2)

## 2021-10-30 LAB — GLUCOSE, CAPILLARY
Glucose-Capillary: 210 mg/dL — ABNORMAL HIGH (ref 70–99)
Glucose-Capillary: 217 mg/dL — ABNORMAL HIGH (ref 70–99)
Glucose-Capillary: 204 mg/dL — ABNORMAL HIGH (ref 70–99)
Glucose-Capillary: 279 mg/dL — ABNORMAL HIGH (ref 70–99)
Glucose-Capillary: 297 mg/dL — ABNORMAL HIGH (ref 70–99)
Glucose-Capillary: 284 mg/dL — ABNORMAL HIGH (ref 70–99)

## 2021-10-30 LAB — HEPARIN LEVEL (UNFRACTIONATED)
Heparin Unfractionated: 0.3 IU/mL (ref 0.30–0.70)
Heparin Unfractionated: 0.42 IU/mL (ref 0.30–0.70)

## 2021-10-30 LAB — BASIC METABOLIC PANEL
Anion gap: 13 (ref 5–15)
BUN: 90 mg/dL — ABNORMAL HIGH (ref 8–23)
CO2: 26 mmol/L (ref 22–32)
Calcium: 9.2 mg/dL (ref 8.9–10.3)
Chloride: 93 mmol/L — ABNORMAL LOW (ref 98–111)
Creatinine, Ser: 2.98 mg/dL — ABNORMAL HIGH (ref 0.44–1.00)
GFR, Estimated: 16 mL/min — ABNORMAL LOW (ref >60)
Glucose, Bld: 200 mg/dL — ABNORMAL HIGH (ref 70–99)
Potassium: 4.2 mmol/L (ref 3.5–5.1)
Sodium: 132 mmol/L — ABNORMAL LOW (ref 135–145)

## 2021-10-30 LAB — PROCALCITONIN: Procalcitonin: 26.96 ng/mL

## 2021-10-30 MED ORDER — ALPRAZOLAM 0.5 MG PO TABS
0.5000 mg | ORAL_TABLET | Freq: Four times a day (QID) | ORAL | Status: DC | PRN
  Administered 2021-11-02 – 2021-11-06 (×3): 0.5 mg via ORAL
  Filled 2021-10-30 (×4): qty 1

## 2021-10-30 MED ORDER — INSULIN GLARGINE-YFGN 100 UNIT/ML ~~LOC~~ SOLN
25.0000 [IU] | Freq: Every day | SUBCUTANEOUS | Status: DC
  Administered 2021-10-30 – 2021-11-10 (×12): 25 [IU] via SUBCUTANEOUS
  Filled 2021-10-30 (×12): qty 0.25

## 2021-10-30 MED ORDER — METOPROLOL TARTRATE 12.5 MG HALF TABLET
12.5000 mg | ORAL_TABLET | Freq: Two times a day (BID) | ORAL | Status: DC
  Administered 2021-10-30 – 2021-11-10 (×21): 12.5 mg via ORAL
  Filled 2021-10-30 (×22): qty 1

## 2021-10-30 MED ORDER — PREGABALIN 25 MG PO CAPS
50.0000 mg | ORAL_CAPSULE | Freq: Two times a day (BID) | ORAL | Status: DC
  Administered 2021-10-30 – 2021-11-09 (×20): 50 mg via ORAL
  Filled 2021-10-30 (×20): qty 2

## 2021-10-30 MED ORDER — VITAMIN D (ERGOCALCIFEROL) 1.25 MG (50000 UNIT) PO CAPS
50000.0000 [IU] | ORAL_CAPSULE | ORAL | Status: DC
  Administered 2021-10-31 – 2021-11-07 (×2): 50000 [IU] via ORAL
  Filled 2021-10-30 (×2): qty 1

## 2021-10-30 MED ORDER — INSULIN ASPART 100 UNIT/ML IJ SOLN
0.0000 [IU] | Freq: Every day | INTRAMUSCULAR | Status: DC
  Administered 2021-10-30: 3 [IU] via SUBCUTANEOUS
  Administered 2021-11-01 – 2021-11-09 (×5): 2 [IU] via SUBCUTANEOUS

## 2021-10-30 MED ORDER — FUROSEMIDE 40 MG PO TABS
120.0000 mg | ORAL_TABLET | Freq: Two times a day (BID) | ORAL | Status: DC
  Administered 2021-10-30 – 2021-10-31 (×2): 120 mg via ORAL
  Filled 2021-10-30 (×2): qty 3

## 2021-10-30 MED ORDER — ACETAMINOPHEN 325 MG PO TABS
650.0000 mg | ORAL_TABLET | Freq: Four times a day (QID) | ORAL | Status: DC | PRN
Start: 2021-10-30 — End: 2021-11-10
  Administered 2021-10-30 – 2021-11-06 (×2): 650 mg via ORAL
  Filled 2021-10-30 (×2): qty 2

## 2021-10-30 MED ORDER — DILTIAZEM HCL-DEXTROSE 125-5 MG/125ML-% IV SOLN (PREMIX)
5.0000 mg/h | INTRAVENOUS | Status: DC
  Administered 2021-10-30: 5 mg/h via INTRAVENOUS
  Filled 2021-10-30: qty 125

## 2021-10-30 MED ORDER — DULOXETINE HCL 30 MG PO CPEP
30.0000 mg | ORAL_CAPSULE | Freq: Every day | ORAL | Status: DC
  Administered 2021-10-31 – 2021-11-10 (×11): 30 mg via ORAL
  Filled 2021-10-30 (×11): qty 1

## 2021-10-30 MED ORDER — TRAMADOL HCL 50 MG PO TABS
50.0000 mg | ORAL_TABLET | Freq: Three times a day (TID) | ORAL | Status: DC | PRN
  Administered 2021-10-31 – 2021-11-05 (×5): 50 mg via ORAL
  Filled 2021-10-30 (×6): qty 1

## 2021-10-30 MED ORDER — LEVOTHYROXINE SODIUM 88 MCG PO TABS
88.0000 ug | ORAL_TABLET | Freq: Every morning | ORAL | Status: DC
  Administered 2021-10-30 – 2021-11-10 (×12): 88 ug via ORAL
  Filled 2021-10-30 (×13): qty 1

## 2021-10-30 MED ORDER — ALLOPURINOL 300 MG PO TABS
300.0000 mg | ORAL_TABLET | Freq: Every morning | ORAL | Status: DC
  Administered 2021-10-31 – 2021-11-10 (×11): 300 mg via ORAL
  Filled 2021-10-30 (×11): qty 1

## 2021-10-30 MED ORDER — INSULIN ASPART 100 UNIT/ML IJ SOLN
0.0000 [IU] | Freq: Three times a day (TID) | INTRAMUSCULAR | Status: DC
  Administered 2021-10-30: 11 [IU] via SUBCUTANEOUS
  Administered 2021-10-31 (×2): 7 [IU] via SUBCUTANEOUS
  Administered 2021-11-01 – 2021-11-02 (×5): 4 [IU] via SUBCUTANEOUS
  Administered 2021-11-02 – 2021-11-04 (×4): 7 [IU] via SUBCUTANEOUS
  Administered 2021-11-04 (×2): 4 [IU] via SUBCUTANEOUS
  Administered 2021-11-05: 11 [IU] via SUBCUTANEOUS
  Administered 2021-11-05: 15 [IU] via SUBCUTANEOUS
  Administered 2021-11-05 – 2021-11-06 (×3): 7 [IU] via SUBCUTANEOUS
  Administered 2021-11-06 – 2021-11-07 (×3): 4 [IU] via SUBCUTANEOUS
  Administered 2021-11-07: 7 [IU] via SUBCUTANEOUS
  Administered 2021-11-08: 4 [IU] via SUBCUTANEOUS
  Administered 2021-11-08 (×2): 11 [IU] via SUBCUTANEOUS
  Administered 2021-11-09: 4 [IU] via SUBCUTANEOUS
  Administered 2021-11-09: 3 [IU] via SUBCUTANEOUS
  Administered 2021-11-09: 4 [IU] via SUBCUTANEOUS
  Administered 2021-11-10: 3 [IU] via SUBCUTANEOUS
  Administered 2021-11-10: 7 [IU] via SUBCUTANEOUS

## 2021-10-30 NOTE — Progress Notes (Signed)
[facility] for Heparin Indication: atrial fibrillation Brief A/P: Heparin level within goal range Continue Heparin at current rate   Allergies  Allergen Reactions   Albuterol Other (See Comments)    REACTION: Tachycardia- AFib   Epinephrine Other (See Comments)    Increases heart rate per patient.    Penicillins Other (See Comments)    REACTION: flushing' \\T'$ \ hot  Did it involve swelling of the face/tongue/throat, SOB, or low BP? No Did it involve sudden or severe rash/hives, skin peeling, or any reaction on the inside of your mouth or nose? No Did you need to seek medical attention at a hospital or doctor's office? No When did it last happen?    20 years ago   If all above answers are "NO", may proceed with cephalosporin use.   Ceftriaxone Other (See Comments)    Sweats   Citalopram Hydrobromide Other (See Comments)    Prolonged QT prolongation   Exenatide Other (See Comments)    Unknown reaction   Ketorolac Tromethamine Hives and Swelling    swelling in hands   Tizanidine Other (See Comments)    lethargic   Torsemide Swelling   Zoledronic Acid Other (See Comments)    Pt was hospitalized due to medication   Bee Venom Other (See Comments)    "makes me nervous"   Ivp Dye [Iodinated Contrast Media] Other (See Comments)    flushing   Keflex [Cephalexin] Other (See Comments)    Makes her feel warm; she was told never to take it.    Patient Measurements: Height: '5\' 9"'$  (175.3 cm) Weight: (!) 136.8 kg (301 lb 9.6 oz) IBW/kg (Calculated) : 66.2 Heparin Dosing Weight: 99 kg  Vital Signs: Temp: 97.3 F (36.3 C) (08/10 0806) Temp Source: Oral (08/10 0806) BP: 120/80 (08/10 1148) Pulse Rate: 77 (08/10 1148)  Labs: Recent Labs    10/28/21 1920 10/28/21 1923 10/28/21 1944 10/28/21 1945 10/28/21 2139 10/29/21 0201 10/29/21 0439 10/29/21 1009 10/29/21 1925 10/30/21 0058 10/30/21 1511  HGB 11.8*   < >  --   --   --  9.3* 10.0*  --    --  10.7*  --   HCT 36.6   < >  --   --   --  29.0* 30.0*  --   --  32.4*  --   PLT 192  --   --   --   --  138* 142*  --   --  141*  --   APTT  --   --  36  --   --   --   --   --   --   --   --   LABPROT 14.8  --   --   --   --   --   --   --   --   --   --   INR 1.2  --   --   --   --   --   --   --   --   --   --   HEPARINUNFRC  --   --   --   --   --   --   --   --   --  0.30 0.42  CREATININE 2.86*  --   --   --   --   --  2.87* 2.91* 2.80* 2.98*  --   TROPONINIHS  --   --   --  53* 84*  --   --   --   --   --   --    < > =  values in this interval not displayed.     Estimated Creatinine Clearance: 24.7 mL/min (A) (by C-G formula based on SCr of 2.98 mg/dL (H)).  Assessment: 76 y.o. female with Afib, Xarelto on hold, for heparin. Heparin came back therapeutic x2 today. We will continue for now.   Goal of Therapy:  Heparin level 0.3-0.7 units/ml Monitor platelets by anticoagulation protocol: Yes   Plan:  Continue Heparin at current rate   Onnie Boer, PharmD, BCIDP, AAHIVP, CPP Infectious Disease Pharmacist 10/30/2021 4:10 PM

## 2021-10-30 NOTE — Progress Notes (Signed)
[facility] Progress Note Patient Name: Kristine [name] DOB: 08-02-1945 MRN: [id number]   Date of Service  10/30/2021  HPI/Events of Note  Elevated HR - ECG reveals: Atrial fibrillation with rapid ventricular response Right superior axis deviation. Pulmonary disease pattern. Incomplete right bundle branch block. Possible Right ventricular hypertrophy. Nonspecific ST abnormality. K+ = 4.1 and Mg++ = 2.3. Ventricular rate = 103 to 133. BP = 118/69. Patient has a history of AFIB and is already on a Heparin IV infusion.   eICU Interventions  Plan: Cardizem IV infusion. Titrate to HR = 65-105.     Intervention Category Major Interventions: Arrhythmia - evaluation and management  Faysal Fenoglio [place] 10/30/2021, 12:11 AM

## 2021-10-30 NOTE — Consult Note (Signed)
[employer]  Total days of antibiotics 3/day 2 cefazolin         Reason for Consult: group b strep bacteremia   Referring Physician: [email or web address]  Principal Problem:   Septic shock ([place]) Active Problems:   Pressure injury of skin    HPI: Kristine [name] is a 76 y.o. female with hx of chronic afib, diastolic heart failure and secondary pHTN, who was admitted on 8/8 for acute onset of fevers, chills, AMS. Temp recorded as high as 102F, presented with tachycardia, hypotension, hypoxia. Labs significant for WBC of 26K, cr at 2.8, initial work up showed no infiltrates on chest CT but infectious work up showed + blood cx with group b strep. Her empiric abtx were narrowed to cefazolin. However, she initially still required pressor support for hypotension. On physical exam, she had left knee nonhealing wound which could be source of infection. 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   Anxiety    Arthritis    Asthma    Atrial fibrillation ([place])    CAP (community acquired pneumonia) 05/31/2015   Cataract    CHF (congestive heart failure) (HCC)    Chronic bronchitis (HCC)    Chronic kidney disease    Clotting disorder ([place])    Community acquired pneumonia 05/31/2015   Depression    Dysrhythmia    GERD (gastroesophageal reflux disease)    High cholesterol    History of hiatal hernia    HTN (hypertension)    Mild aortic sclerosis    Morbid obesity (HCC)    Neuromuscular disorder ([place])    Neuropathic pain    Osteoporosis    Persistent atrial fibrillation (HCC)    Scoliosis    Sleep apnea    Spinal stenosis    Thyroid disease    Type II diabetes mellitus (HCC)    Vitamin D deficiency     Allergies:  Allergies  Allergen Reactions   Albuterol Other (See Comments)    REACTION: Tachycardia- AFib   Epinephrine Other (See Comments)    Increases heart rate per  patient.    Penicillins Other (See Comments)    REACTION: flushing' \\T'$ \ hot  Did it involve swelling of the face/tongue/throat, SOB, or low BP? No Did it involve sudden or severe rash/hives, skin peeling, or any reaction on the inside of your mouth or nose? No Did you need to seek medical attention at a hospital or doctor's office? No When did it last happen?    20 years ago   If all above answers are "NO", may proceed with cephalosporin use.   Ceftriaxone Other (See Comments)    Sweats   Citalopram Hydrobromide Other (See Comments)    Prolonged QT prolongation   Exenatide Other (See Comments)    Unknown reaction   Ketorolac Tromethamine Hives and Swelling    swelling in hands   Tizanidine Other (See Comments)    lethargic   Torsemide Swelling   Zoledronic Acid Other (See Comments)    Pt was hospitalized due to medication   Bee Venom Other (See Comments)    "makes me nervous"   Ivp Dye [Iodinated Contrast Media] Other (See Comments)    flushing   Keflex [Cephalexin] Other (See Comments)    Makes her feel warm; she was told never to take it.    Current antibiotics:   MEDICATIONS:  [  START ON 10/31/2021] allopurinol  300 mg Oral q AM   [START ON 10/31/2021] DULoxetine  30 mg Oral Daily   furosemide  120 mg Oral BID   insulin aspart  0-20 Units Subcutaneous TID WC   insulin aspart  0-5 Units Subcutaneous QHS   insulin glargine-yfgn  25 Units Subcutaneous Daily   levothyroxine  88 mcg Oral q morning   metoprolol tartrate  12.5 mg Oral BID   pregabalin  50 mg Oral BID   [START ON 10/31/2021] Vitamin D (Ergocalciferol)  50,000 Units Oral Q7 days    Social History   Tobacco Use   Smoking status: Never   Smokeless tobacco: Never  Vaping Use   Vaping Use: Never used  Substance Use Topics   Alcohol use: No   Drug use: No    Family History  Problem Relation Age of Onset   Stroke Father    Hypertension Father    Atrial fibrillation Father        HAD MURMUR   Heart  failure Mother    Congestive Heart Failure Mother    Hypertension Mother    Hypertension Brother    Hypertension Sister    Hypertension Son    CAD Sister        EARLY   CAD Brother        EARLY   Colon cancer Neg Hx    Esophageal cancer Neg Hx    Stomach cancer Neg Hx    Rectal cancer Neg Hx      Review of Systems  Constitutional: + for fever, chills, diaphoresis, activity change, appetite change, fatigue and unexpected weight change.  HENT: Negative for congestion, sore throat, rhinorrhea, sneezing, trouble swallowing and sinus pressure.  Eyes: Negative for photophobia and visual disturbance.  Respiratory: Negative for cough, chest tightness, shortness of breath, wheezing and stridor.  Cardiovascular: Negative for chest pain, palpitations and leg swelling.  Gastrointestinal: Negative for nausea, vomiting, abdominal pain, diarrhea, constipation, blood in stool, abdominal distention and anal bleeding.  Genitourinary: Negative for dysuria, hematuria, flank pain and difficulty urinating.  Musculoskeletal: + left knee wound and left foot ulcer.Negative for myalgias, back pain, joint swelling, arthralgias and gait problem.  Skin: Negative for color change, pallor, rash and wound.  Neurological: Negative for dizziness, tremors, weakness and light-headedness.  Hematological: Negative for adenopathy. Does not bruise/bleed easily.  Psychiatric/Behavioral: Negative for behavioral problems, confusion, sleep disturbance, dysphoric mood, decreased concentration and agitation.    OBJECTIVE: Temp:  [97.2 F (36.2 C)-98.4 F (36.9 C)] 97.2 F (36.2 C) (08/10 1647) Pulse Rate:  [77-127] 78 (08/10 1647) Resp:  [13-18] 17 (08/10 1647) BP: (95-128)/(56-87) 119/65 (08/10 1647) SpO2:  [91 %-97 %] 95 % (08/10 0806) Weight:  [135.2 kg-136.8 kg] 136.8 kg (08/10 1446) Physical Exam  Constitutional:  oriented to person, place, and time. appears well-developed and well-nourished. No distress.  HENT:  [place]/AT, PERRLA, no scleral icterus Mouth/Throat: Oropharynx is clear and moist. No oropharyngeal exudate.  Cardiovascular: Normal rate, regular rhythm and normal heart sounds. Exam reveals no gallop and no friction rub.  No murmur heard.  Pulmonary/Chest: Effort normal and breath sounds normal. No respiratory distress.  has no wheezes.  Neck = supple, no nuchal rigidity Abdominal: Soft. Bowel sounds are normal.  exhibits no distension. There is no tenderness.  PYK:DXIP knee bruising, with sersanginous drainage. Left foot ulcer,plantar aspect no surrounding erythema Lymphadenopathy: no cervical adenopathy. No axillary adenopathy Neurological: alert and oriented to person, place, and time.  Skin: Skin  is warm and dry. No rash noted. No erythema.  Psychiatric: a normal mood and affect.  behavior is normal.    LABS: Results for orders placed or performed during the hospital encounter of 10/28/21 (from the past 48 hour(s))  SARS Coronavirus 2 by RT PCR (hospital order, performed in [facility] hospital lab) *cepheid single result test* Anterior Nasal Swab     Status: None   Collection Time: 10/28/21  6:50 PM   Specimen: Anterior Nasal Swab  Result Value Ref Range   SARS Coronavirus 2 by RT PCR NEGATIVE NEGATIVE    Comment: (NOTE) SARS-CoV-2 target nucleic acids are NOT DETECTED.  The SARS-CoV-2 RNA is generally detectable in upper and lower respiratory specimens during the acute phase of infection. The lowest concentration of SARS-CoV-2 viral copies this assay can detect is 250 copies / mL. A negative result does not preclude SARS-CoV-2 infection and should not be used as the sole basis for treatment or other patient management decisions.  A negative result may occur with improper specimen collection / handling, submission of specimen other than nasopharyngeal swab, presence of viral mutation(s) within the areas targeted by this assay, and inadequate number of viral copies (<250 copies /  mL). A negative result must be combined with clinical observations, patient history, and epidemiological information.  Fact Sheet for Patients:   https://www.patel.info/  Fact Sheet for Healthcare Providers: https://hall.com/  This test is not yet approved or  cleared by the [place] FDA and has been authorized for detection and/or diagnosis of SARS-CoV-2 by FDA under an Emergency Use Authorization (EUA).  This EUA will remain in effect (meaning this test can be used) for the duration of the COVID-19 declaration under Section 564(b)(1) of the Act, 21 U.S.C. section 360bbb-3(b)(1), unless the authorization is terminated or revoked sooner.  Performed at [facility], [address] [place], [place] [address]   CBG monitoring, ED     Status: Abnormal   Collection Time: 10/28/21  7:08 PM  Result Value Ref Range   Glucose-Capillary 111 (H) 70 - 99 mg/dL    Comment: Glucose reference range applies only to samples taken after fasting for at least 8 hours.   Comment 1 Notify RN   Comprehensive metabolic panel     Status: Abnormal   Collection Time: 10/28/21  7:20 PM  Result Value Ref Range   Sodium 134 (L) 135 - 145 mmol/L   Potassium 2.7 (LL) 3.5 - 5.1 mmol/L    Comment: CRITICAL RESULT CALLED TO, READ BACK BY AND VERIFIED WITH ALFRED DILLARD RN AT 2013 ON 10/28/21 BY [email or web address]   Chloride 91 (L) 98 - 111 mmol/L   CO2 28 22 - 32 mmol/L   Glucose, Bld 117 (H) 70 - 99 mg/dL    Comment: Glucose reference range applies only to samples taken after fasting for at least 8 hours.   BUN 89 (H) 8 - 23 mg/dL   Creatinine, Ser 2.86 (H) 0.44 - 1.00 mg/dL   Calcium 9.6 8.9 - 10.3 mg/dL   Total Protein 7.8 6.5 - 8.1 g/dL   Albumin 4.0 3.5 - 5.0 g/dL   AST 21 15 - 41 U/L   ALT 9 0 - 44 U/L   Alkaline Phosphatase 110 38 - 126 U/L   Total Bilirubin 2.2 (H) 0.3 - 1.2 mg/dL   GFR, Estimated 17 (L) >60 mL/min    Comment: (NOTE) Calculated using  the CKD-EPI Creatinine Equation (2021)    Anion gap 15  5 - 15    Comment: Performed at [facility], [address] [place], [place] [address]  Lactic acid, plasma     Status: Abnormal   Collection Time: 10/28/21  7:20 PM  Result Value Ref Range   Lactic Acid, Venous 2.8 (HH) 0.5 - 1.9 mmol/L    Comment: CRITICAL RESULT CALLED TO, READ BACK BY AND VERIFIED WITH ALFRED DILLARD RN AT 2013 ON 10/28/21 BY [email or web address] Performed at [facility], [address] [place], [place] [address]   CBC with Differential     Status: Abnormal   Collection Time: 10/28/21  7:20 PM  Result Value Ref Range   WBC 26.0 (H) 4.0 - 10.5 K/uL   RBC 4.12 3.87 - 5.11 MIL/uL   Hemoglobin 11.8 (L) 12.0 - 15.0 g/dL   HCT 36.6 36.0 - 46.0 %   MCV 88.8 80.0 - 100.0 fL   MCH 28.6 26.0 - 34.0 pg   MCHC 32.2 30.0 - 36.0 g/dL   RDW 17.2 (H) 11.5 - 15.5 %   Platelets 192 150 - 400 K/uL   nRBC 0.0 0.0 - 0.2 %   Neutrophils Relative % 93 %   Neutro Abs 24.0 (H) 1.7 - 7.7 K/uL   Lymphocytes Relative 3 %   Lymphs Abs 0.7 0.7 - 4.0 K/uL   Monocytes Relative 3 %   Monocytes Absolute 0.8 0.1 - 1.0 K/uL   Eosinophils Relative 0 %   Eosinophils Absolute 0.1 0.0 - 0.5 K/uL   Basophils Relative 0 %   Basophils Absolute 0.1 0.0 - 0.1 K/uL   WBC Morphology TOXIC GRANULATION    Immature Granulocytes 1 %   Abs Immature Granulocytes 0.22 (H) 0.00 - 0.07 K/uL   Target Cells PRESENT    Stomatocytes PRESENT    Giant PLTs PRESENT     Comment: Performed at [facility], [address] [place], [place] [address]  Protime-INR     Status: None   Collection Time: 10/28/21  7:20 PM  Result Value Ref Range   Prothrombin Time 14.8 11.4 - 15.2 seconds   INR 1.2 0.8 - 1.2    Comment: (NOTE) INR goal varies based on device and disease states. Performed at [facility], [address] [place], [place] [address]   Culture, blood (Routine x 2)     Status: Abnormal (Preliminary result)    Collection Time: 10/28/21  7:20 PM   Specimen: Left Antecubital; Blood  Result Value Ref Range   Specimen Description      LEFT ANTECUBITAL BLOOD Performed at [facility], [place] [address], [place], [place] [address]    Special Requests      BOTTLES DRAWN AEROBIC AND ANAEROBIC Blood Culture adequate volume Performed at [facility], [address] [place], [place] [address]    Culture  Setup Time      GRAM POSITIVE COCCI IN BOTH AEROBIC AND ANAEROBIC BOTTLES CRITICAL RESULT CALLED TO, READ BACK BY AND VERIFIED WITH: PHARMD A. PAYTES [id number] '@1039'$  FH Performed at [facility] [address] [place], [place] [address]    Culture GROUP B STREP(S.AGALACTIAE)ISOLATED (A)    Report Status PENDING   I-Stat venous blood gas, ED     Status: Abnormal   Collection Time: 10/28/21  7:23 PM  Result Value Ref Range   pH, Ven 7.398 7.25 - 7.43   pCO2, Ven 50.7 44 - 60 mmHg  pO2, Ven 44 32 - 45 mmHg   Bicarbonate 30.5 (H) 20.0 - 28.0 mmol/L   TCO2 32 22 - 32 mmol/L   O2 Saturation 71 %   Acid-Base Excess 5.0 (H) 0.0 - 2.0 mmol/L   Sodium 135 135 - 145 mmol/L   Potassium 2.7 (LL) 3.5 - 5.1 mmol/L   Calcium, Ion 1.13 (L) 1.15 - 1.40 mmol/L   HCT 43.0 36.0 - 46.0 %   Hemoglobin 14.6 12.0 - 15.0 g/dL   Patient temperature 103.3 F    Collection site IV start    Drawn by Nurse    Sample type VENOUS    Comment NOTIFIED PHYSICIAN   Culture, blood (Routine x 2)     Status: Abnormal (Preliminary result)   Collection Time: 10/28/21  7:25 PM   Specimen: BLOOD LEFT ARM  Result Value Ref Range   Specimen Description      BLOOD LEFT ARM Performed at [facility], [address] [place], [place] [address]    Special Requests      BOTTLES DRAWN AEROBIC ONLY Blood Culture results may not be optimal due to an inadequate volume of blood received in culture bottles Performed at [facility], [address] [place], [place] [address]    Culture  Setup Time       GRAM POSITIVE COCCI IN CHAINS BOTTLES DRAWN AEROBIC ONLY CRITICAL RESULT CALLED TO, READ BACK BY AND VERIFIED WITH: PHARMD A. PAYTES [id number] '@1039'$  FH    Culture (A)     GROUP B STREP(S.AGALACTIAE)ISOLATED SUSCEPTIBILITIES TO FOLLOW Performed at [facility], [place] [address] [place], [place] [address]    Report Status PENDING   Blood Culture ID Panel (Reflexed)     Status: Abnormal   Collection Time: 10/28/21  7:25 PM  Result Value Ref Range   Enterococcus faecalis NOT DETECTED NOT DETECTED   Enterococcus Faecium NOT DETECTED NOT DETECTED   Listeria monocytogenes NOT DETECTED NOT DETECTED   Staphylococcus species NOT DETECTED NOT DETECTED   Staphylococcus aureus (BCID) NOT DETECTED NOT DETECTED   Staphylococcus epidermidis NOT DETECTED NOT DETECTED   Staphylococcus lugdunensis NOT DETECTED NOT DETECTED   Streptococcus species DETECTED (A) NOT DETECTED    Comment: CRITICAL RESULT CALLED TO, READ BACK BY AND VERIFIED WITH: PHARMD A. PAYTES [id number] '@1039'$  FH    Streptococcus agalactiae DETECTED (A) NOT DETECTED    Comment: CRITICAL RESULT CALLED TO, READ BACK BY AND VERIFIED WITH: PHARMD A. PAYTES [id number] '@1039'$  FH    Streptococcus pneumoniae NOT DETECTED NOT DETECTED   Streptococcus pyogenes NOT DETECTED NOT DETECTED   A.calcoaceticus-baumannii NOT DETECTED NOT DETECTED   Bacteroides fragilis NOT DETECTED NOT DETECTED   Enterobacterales NOT DETECTED NOT DETECTED   Enterobacter cloacae complex NOT DETECTED NOT DETECTED   Escherichia coli NOT DETECTED NOT DETECTED   Klebsiella aerogenes NOT DETECTED NOT DETECTED   Klebsiella oxytoca NOT DETECTED NOT DETECTED   Klebsiella pneumoniae NOT DETECTED NOT DETECTED   Proteus species NOT DETECTED NOT DETECTED   Salmonella species NOT DETECTED NOT DETECTED   Serratia marcescens NOT DETECTED NOT DETECTED   Haemophilus influenzae NOT DETECTED NOT DETECTED   Neisseria meningitidis NOT DETECTED NOT DETECTED   Pseudomonas aeruginosa NOT  DETECTED NOT DETECTED   Stenotrophomonas maltophilia NOT DETECTED NOT DETECTED   Candida albicans NOT DETECTED NOT DETECTED   Candida auris NOT DETECTED NOT DETECTED   Candida glabrata NOT DETECTED NOT DETECTED   Candida krusei NOT DETECTED NOT  DETECTED   Candida parapsilosis NOT DETECTED NOT DETECTED   Candida tropicalis NOT DETECTED NOT DETECTED   Cryptococcus neoformans/gattii NOT DETECTED NOT DETECTED    Comment: Performed at [facility], [address] [place], [place] [address]  Magnesium     Status: None   Collection Time: 10/28/21  7:44 PM  Result Value Ref Range   Magnesium 2.4 1.7 - 2.4 mg/dL    Comment: Performed at [facility], [address] [place], [place] [address]  APTT     Status: None   Collection Time: 10/28/21  7:44 PM  Result Value Ref Range   aPTT 36 24 - 36 seconds    Comment: Performed at [facility], [address] [place], [place] [address]  Brain natriuretic peptide (IF shortness of breath has been documented this visit)     Status: Abnormal   Collection Time: 10/28/21  7:45 PM  Result Value Ref Range   B Natriuretic Peptide 276.7 (H) 0.0 - 100.0 pg/mL    Comment: Performed at [facility], [address] [place], [place] [address]  Troponin I (High Sensitivity)     Status: Abnormal   Collection Time: 10/28/21  7:45 PM  Result Value Ref Range   Troponin I (High Sensitivity) 53 (H) <18 ng/L    Comment: (NOTE) Elevated high sensitivity troponin I (hsTnI) values and significant  changes across serial measurements may suggest ACS but many other  chronic and acute conditions are known to elevate hsTnI results.  Refer to the "Links" section for chest pain algorithms and additional  guidance. Performed at [facility], [address] [place], [place] [address]   Lactic acid, plasma     Status: Abnormal   Collection Time: 10/28/21  9:39 PM  Result Value Ref Range   Lactic Acid, Venous 2.4 (HH) 0.5 -  1.9 mmol/L    Comment: CRITICAL VALUE NOTED. VALUE IS CONSISTENT WITH PREVIOUSLY REPORTED/CALLED VALUE. JAG Performed at [facility], [address] [place], [place] [address]   Ammonia     Status: Abnormal   Collection Time: 10/28/21  9:39 PM  Result Value Ref Range   Ammonia 38 (H) 9 - 35 umol/L    Comment: Performed at [facility], [address] [place], [place] [address]  Troponin I (High Sensitivity)     Status: Abnormal   Collection Time: 10/28/21  9:39 PM  Result Value Ref Range   Troponin I (High Sensitivity) 84 (H) <18 ng/L    Comment: DELTA CHECK NOTED. JAG RESULT CALLED TO, READ BACK BY AND VERIFIED WITH: ALFRED DILLARD RN ON 10/29/21 AT 0702 [facility] (NOTE) Elevated high sensitivity troponin I (hsTnI) values and significant  changes across serial measurements may suggest ACS but many other  chronic and acute conditions are known to elevate hsTnI results.  Refer to the Links section for chest pain algorithms and additional  guidance. Performed at [facility], [address] [place], [place] [address]   Urinalysis, Routine w reflex microscopic Urine, In & Out Cath     Status: None   Collection Time: 10/28/21  9:40 PM  Result Value Ref Range   Color, Urine YELLOW YELLOW   APPearance CLEAR CLEAR   Specific Gravity, Urine 1.020 1.005 - 1.030   pH 5.0 5.0 - 8.0   Glucose, UA NEGATIVE NEGATIVE mg/dL   Hgb urine dipstick NEGATIVE NEGATIVE  Bilirubin Urine NEGATIVE NEGATIVE   Ketones, ur NEGATIVE NEGATIVE mg/dL   Protein, ur NEGATIVE NEGATIVE mg/dL   Nitrite NEGATIVE NEGATIVE   Leukocytes,Ua NEGATIVE NEGATIVE    Comment: Microscopic not done on urines with negative protein, blood, leukocytes, nitrite, or glucose < 500 mg/dL. Performed at [facility], [address] [place], [place] [address]   Glucose, capillary     Status: Abnormal   Collection Time: 10/29/21 12:53 AM  Result Value Ref Range   Glucose-Capillary 102 (H) 70  - 99 mg/dL    Comment: Glucose reference range applies only to samples taken after fasting for at least 8 hours.  MRSA Next Gen by PCR, Nasal     Status: None   Collection Time: 10/29/21  1:06 AM   Specimen: Nasal Mucosa; Nasal Swab  Result Value Ref Range   MRSA by PCR Next Gen NOT DETECTED NOT DETECTED    Comment: (NOTE) The GeneXpert MRSA Assay (FDA approved for NASAL specimens only), is one component of a comprehensive MRSA colonization surveillance program. It is not intended to diagnose MRSA infection nor to guide or monitor treatment for MRSA infections. Test performance is not FDA approved in patients less than 67 years old. Performed at [facility], [place] [address] [place], [place] [address]   Type and screen If need to transfuse blood products please use the blood administration order set     Status: None   Collection Time: 10/29/21  1:50 AM  Result Value Ref Range   ABO/RH(D) A NEG    Antibody Screen NEG    Sample Expiration      11/01/2021,2359 Performed at [facility] [address], [place], [place] [address]   Lactic acid, plasma     Status: Abnormal   Collection Time: 10/29/21  2:01 AM  Result Value Ref Range   Lactic Acid, Venous 4.2 (HH) 0.5 - 1.9 mmol/L    Comment: CRITICAL VALUE NOTED. VALUE IS CONSISTENT WITH PREVIOUSLY REPORTED/CALLED VALUE Performed at [facility], [place] [address], [place], [place] [address]   CBC with Differential/Platelet     Status: Abnormal   Collection Time: 10/29/21  2:01 AM  Result Value Ref Range   WBC 40.6 (H) 4.0 - 10.5 K/uL   RBC 3.24 (L) 3.87 - 5.11 MIL/uL   Hemoglobin 9.3 (L) 12.0 - 15.0 g/dL   HCT 29.0 (L) 36.0 - 46.0 %   MCV 89.5 80.0 - 100.0 fL   MCH 28.7 26.0 - 34.0 pg   MCHC 32.1 30.0 - 36.0 g/dL   RDW 17.2 (H) 11.5 - 15.5 %   Platelets 138 (L) 150 - 400 K/uL   nRBC 0.0 0.0 - 0.2 %   Neutrophils Relative % 92 %   Neutro Abs 37.3 (H) 1.7 - 7.7 K/uL   Lymphocytes Relative 2 %   Lymphs Abs 0.7  0.7 - 4.0 K/uL   Monocytes Relative 4 %   Monocytes Absolute 1.8 (H) 0.1 - 1.0 K/uL   Eosinophils Relative 0 %   Eosinophils Absolute 0.0 0.0 - 0.5 K/uL   Basophils Relative 0 %   Basophils Absolute 0.1 0.0 - 0.1 K/uL   Immature Granulocytes 2 %   Abs Immature Granulocytes 0.66 (H) 0.00 - 0.07 K/uL    Comment: Performed at [facility] [address] [place], [place] [address]  Glucose, capillary     Status: None   Collection Time: 10/29/21  3:46 AM  Result Value Ref Range   Glucose-Capillary 83 70 - 99 mg/dL    Comment: Glucose reference range applies only to samples taken after fasting for at least 8 hours.  Lactic acid, plasma     Status: None   Collection Time: 10/29/21  4:39 AM  Result Value Ref Range   Lactic Acid, Venous 1.6 0.5 - 1.9 mmol/L    Comment: Performed at [facility] [address] [place], [place] [address]  CBC     Status: Abnormal   Collection Time: 10/29/21  4:39 AM  Result Value Ref Range   WBC 43.8 (H) 4.0 - 10.5 K/uL   RBC 3.41 (L) 3.87 - 5.11 MIL/uL   Hemoglobin 10.0 (L) 12.0 - 15.0 g/dL   HCT 30.0 (L) 36.0 - 46.0 %   MCV 88.0 80.0 - 100.0 fL   MCH 29.3 26.0 - 34.0 pg   MCHC 33.3 30.0 - 36.0 g/dL   RDW 16.8 (H) 11.5 - 15.5 %   Platelets 142 (L) 150 - 400 K/uL   nRBC 0.0 0.0 - 0.2 %    Comment: Performed at [facility] [address], [place], [place] [address]  Blood gas, venous     Status: Abnormal   Collection Time: 10/29/21  4:39 AM  Result Value Ref Range   pH, Ven 7.51 (H) 7.25 - 7.43   pCO2, Ven 34 (L) 44 - 60 mmHg   pO2, Ven 60 (H) 32 - 45 mmHg   Bicarbonate 27.3 20.0 - 28.0 mmol/L   Acid-Base Excess 4.1 (H) 0.0 - 2.0 mmol/L   O2 Saturation 93.2 %   Patient temperature 36.6    Collection site RIGHT RADIAL    Drawn by [id number]     Comment: Performed at [facility] [address] [place], [place] [address]  Comprehensive metabolic panel     Status: Abnormal   Collection Time: 10/29/21  4:39 AM  Result Value  Ref Range   Sodium 134 (L) 135 - 145 mmol/L   Potassium 3.0 (L) 3.5 - 5.1 mmol/L   Chloride 93 (L) 98 - 111 mmol/L   CO2 27 22 - 32 mmol/L   Glucose, Bld 82 70 - 99 mg/dL    Comment: Glucose reference range applies only to samples taken after fasting for at least 8 hours.   BUN 85 (H) 8 - 23 mg/dL   Creatinine, Ser 2.87 (H) 0.44 - 1.00 mg/dL   Calcium 8.8 (L) 8.9 - 10.3 mg/dL   Total Protein 5.7 (L) 6.5 - 8.1 g/dL   Albumin 2.9 (L) 3.5 - 5.0 g/dL   AST 16 15 - 41 U/L   ALT 9 0 - 44 U/L   Alkaline Phosphatase 76 38 - 126 U/L   Total Bilirubin 2.1 (H) 0.3 - 1.2 mg/dL   GFR, Estimated 17 (L) >60 mL/min    Comment: (NOTE) Calculated using the CKD-EPI Creatinine Equation (2021)    Anion gap 14 5 - 15    Comment: Performed at [facility], [place] [address] [place], [place] [address]  Magnesium     Status: None   Collection Time: 10/29/21  4:39 AM  Result Value Ref Range   Magnesium 2.3 1.7 - 2.4 mg/dL    Comment: Performed at [facility] [address], [place], [place] [id number]  Procalcitonin     Status: None   Collection Time: 10/29/21  4:39 AM  Result Value Ref Range  Procalcitonin 18.87 ng/mL    Comment:        Interpretation: PCT >= 10 ng/mL: Important systemic inflammatory response, almost exclusively due to severe bacterial sepsis or septic shock. (NOTE)       Sepsis PCT Algorithm           Lower Respiratory Tract                                      Infection PCT Algorithm    ----------------------------     ----------------------------         PCT < 0.25 ng/mL                PCT < 0.10 ng/mL          Strongly encourage             Strongly discourage   discontinuation of antibiotics    initiation of antibiotics    ----------------------------     -----------------------------       PCT 0.25 - 0.50 ng/mL            PCT 0.10 - 0.25 ng/mL               OR       >80% decrease in PCT            Discourage initiation of                                             antibiotics      Encourage discontinuation           of antibiotics    ----------------------------     -----------------------------         PCT >= 0.50 ng/mL              PCT 0.26 - 0.50 ng/mL                AND       <80% decrease in PCT             Encourage initiation of                                             antibiotics       Encourage continuation           of antibiotics    ----------------------------     -----------------------------        PCT >= 0.50 ng/mL                  PCT > 0.50 ng/mL               AND         increase in PCT                  Strongly encourage                                      initiation of antibiotics    Strongly encourage escalation           of antibiotics                                     -----------------------------  PCT <= 0.25 ng/mL                                                 OR                                        > 80% decrease in PCT                                      Discontinue / Do not initiate                                             antibiotics  Performed at [facility], [place] [address] [place], [place] [address]   Phosphorus     Status: Abnormal   Collection Time: 10/29/21  4:39 AM  Result Value Ref Range   Phosphorus 4.8 (H) 2.5 - 4.6 mg/dL    Comment: Performed at [facility] [address] [place], [place] [address]  ABO/Rh     Status: None   Collection Time: 10/29/21  4:41 AM  Result Value Ref Range   ABO/RH(D)      A NEG Performed at [facility] [address] [place], [place] [address]   Glucose, capillary     Status: Abnormal   Collection Time: 10/29/21  8:04 AM  Result Value Ref Range   Glucose-Capillary 69 (L) 70 - 99 mg/dL    Comment: Glucose reference range applies only to samples taken after fasting for at least 8 hours.  Glucose, capillary     Status: Abnormal   Collection Time: 10/29/21  9:20 AM  Result Value Ref  Range   Glucose-Capillary 110 (H) 70 - 99 mg/dL    Comment: Glucose reference range applies only to samples taken after fasting for at least 8 hours.  Basic metabolic panel     Status: Abnormal   Collection Time: 10/29/21 10:09 AM  Result Value Ref Range   Sodium 134 (L) 135 - 145 mmol/L   Potassium 3.1 (L) 3.5 - 5.1 mmol/L   Chloride 95 (L) 98 - 111 mmol/L   CO2 26 22 - 32 mmol/L   Glucose, Bld 94 70 - 99 mg/dL    Comment: Glucose reference range applies only to samples taken after fasting for at least 8 hours.   BUN 86 (H) 8 - 23 mg/dL   Creatinine, Ser 2.91 (H) 0.44 - 1.00 mg/dL   Calcium 8.9 8.9 - 10.3 mg/dL   GFR, Estimated 16 (L) >60 mL/min    Comment: (NOTE) Calculated using the CKD-EPI Creatinine Equation (2021)    Anion gap 13 5 - 15    Comment: Performed at [facility] [address] [place], [place] [address]  TSH     Status: None   Collection Time: 10/29/21 10:09 AM  Result Value Ref Range   TSH 2.149 0.350 - 4.500 uIU/mL    Comment: Performed by a 3rd Generation assay with a functional sensitivity of <=0.01 uIU/mL. Performed at [facility], [address], [place], [place] [address]   Glucose, capillary     Status: None   Collection Time: 10/29/21 12:28 PM  Result Value Ref Range   Glucose-Capillary 79 70 - 99 mg/dL    Comment: Glucose reference range applies only to samples taken after fasting for at least 8 hours.  Glucose, capillary     Status: None   Collection Time: 10/29/21  5:05 PM  Result Value Ref Range   Glucose-Capillary 89 70 - 99 mg/dL    Comment: Glucose reference range applies only to samples taken after fasting for at least 8 hours.  Basic metabolic panel     Status: Abnormal   Collection Time: 10/29/21  7:25 PM  Result Value Ref Range   Sodium 132 (L) 135 - 145 mmol/L   Potassium 4.1 3.5 - 5.1 mmol/L    Comment: DELTA CHECK NOTED   Chloride 93 (L) 98 - 111 mmol/L   CO2 22 22 - 32 mmol/L   Glucose, Bld 145 (H) 70 - 99 mg/dL     Comment: Glucose reference range applies only to samples taken after fasting for at least 8 hours.   BUN 88 (H) 8 - 23 mg/dL   Creatinine, Ser 2.80 (H) 0.44 - 1.00 mg/dL   Calcium 9.0 8.9 - 10.3 mg/dL   GFR, Estimated 17 (L) >60 mL/min    Comment: (NOTE) Calculated using the CKD-EPI Creatinine Equation (2021)    Anion gap 17 (H) 5 - 15    Comment: Performed at [facility] [address], [place], [place] [address]  Glucose, capillary     Status: Abnormal   Collection Time: 10/29/21  8:22 PM  Result Value Ref Range   Glucose-Capillary 160 (H) 70 - 99 mg/dL    Comment: Glucose reference range applies only to samples taken after fasting for at least 8 hours.  Glucose, capillary     Status: Abnormal   Collection Time: 10/29/21 11:46 PM  Result Value Ref Range   Glucose-Capillary 206 (H) 70 - 99 mg/dL    Comment: Glucose reference range applies only to samples taken after fasting for at least 8 hours.  Heparin level (unfractionated)     Status: None   Collection Time: 10/30/21 12:58 AM  Result Value Ref Range   Heparin Unfractionated 0.30 0.30 - 0.70 IU/mL    Comment: (NOTE) The clinical reportable range upper limit is being lowered to >1.10 to align with the FDA approved guidance for the current laboratory assay.  If heparin results are below expected values, and patient dosage has  been confirmed, suggest follow up testing of antithrombin III levels. Performed at [facility], [place] [address], [place], [place] [address]   Basic metabolic panel     Status: Abnormal   Collection Time: 10/30/21 12:58 AM  Result Value Ref Range   Sodium 132 (L) 135 - 145 mmol/L   Potassium 4.2 3.5 - 5.1 mmol/L   Chloride 93 (L) 98 - 111 mmol/L   CO2 26 22 - 32 mmol/L   Glucose, Bld 200 (H) 70 - 99 mg/dL    Comment: Glucose reference range applies only to samples taken after fasting for at least 8 hours.   BUN 90 (H) 8 - 23 mg/dL   Creatinine, Ser 2.98 (H) 0.44 - 1.00 mg/dL    Calcium 9.2 8.9 - 10.3 mg/dL   GFR, Estimated 16 (L) >60 mL/min    Comment: (NOTE) Calculated using the CKD-EPI Creatinine  Equation (2021)    Anion gap 13 5 - 15    Comment: Performed at [facility], [place] [address], [place], [place] [address]  Procalcitonin     Status: None   Collection Time: 10/30/21 12:58 AM  Result Value Ref Range   Procalcitonin 26.96 ng/mL    Comment:        Interpretation: PCT >= 10 ng/mL: Important systemic inflammatory response, almost exclusively due to severe bacterial sepsis or septic shock. (NOTE)       Sepsis PCT Algorithm           Lower Respiratory Tract                                      Infection PCT Algorithm    ----------------------------     ----------------------------         PCT < 0.25 ng/mL                PCT < 0.10 ng/mL          Strongly encourage             Strongly discourage   discontinuation of antibiotics    initiation of antibiotics    ----------------------------     -----------------------------       PCT 0.25 - 0.50 ng/mL            PCT 0.10 - 0.25 ng/mL               OR       >80% decrease in PCT            Discourage initiation of                                            antibiotics      Encourage discontinuation           of antibiotics    ----------------------------     -----------------------------         PCT >= 0.50 ng/mL              PCT 0.26 - 0.50 ng/mL                AND       <80% decrease in PCT             Encourage initiation of                                             antibiotics       Encourage continuation           of antibiotics    ----------------------------     -----------------------------        PCT >= 0.50 ng/mL                  PCT > 0.50 ng/mL               AND         increase in PCT                  Strongly encourage  initiation of antibiotics    Strongly encourage escalation           of antibiotics                                      -----------------------------                                           PCT <= 0.25 ng/mL                                                 OR                                        > 80% decrease in PCT                                      Discontinue / Do not initiate                                             antibiotics  Performed at [facility], [address], [place], [place] [address]   CBC     Status: Abnormal   Collection Time: 10/30/21 12:58 AM  Result Value Ref Range   WBC 24.3 (H) 4.0 - 10.5 K/uL   RBC 3.70 (L) 3.87 - 5.11 MIL/uL   Hemoglobin 10.7 (L) 12.0 - 15.0 g/dL   HCT 32.4 (L) 36.0 - 46.0 %   MCV 87.6 80.0 - 100.0 fL   MCH 28.9 26.0 - 34.0 pg   MCHC 33.0 30.0 - 36.0 g/dL   RDW 16.6 (H) 11.5 - 15.5 %   Platelets 141 (L) 150 - 400 K/uL   nRBC 0.0 0.0 - 0.2 %    Comment: Performed at [facility], [place] [address] [place], [place] [address]  Glucose, capillary     Status: Abnormal   Collection Time: 10/30/21  3:56 AM  Result Value Ref Range   Glucose-Capillary 210 (H) 70 - 99 mg/dL    Comment: Glucose reference range applies only to samples taken after fasting for at least 8 hours.  Glucose, capillary     Status: Abnormal   Collection Time: 10/30/21  6:30 AM  Result Value Ref Range   Glucose-Capillary 217 (H) 70 - 99 mg/dL    Comment: Glucose reference range applies only to samples taken after fasting for at least 8 hours.  Glucose, capillary     Status: Abnormal   Collection Time: 10/30/21  8:09 AM  Result Value Ref Range   Glucose-Capillary 204 (H) 70 - 99 mg/dL    Comment: Glucose reference range applies only to samples taken after fasting for at least 8 hours.  Glucose, capillary     Status: Abnormal   Collection Time: 10/30/21 12:29 PM  Result Value Ref Range   Glucose-Capillary 279 (H) 70 -  99 mg/dL    Comment: Glucose reference range applies only to samples taken after fasting for at least 8 hours.  Heparin level (unfractionated)      Status: None   Collection Time: 10/30/21  3:11 PM  Result Value Ref Range   Heparin Unfractionated 0.42 0.30 - 0.70 IU/mL    Comment: (NOTE) The clinical reportable range upper limit is being lowered to >1.10 to align with the FDA approved guidance for the current laboratory assay.  If heparin results are below expected values, and patient dosage has  been confirmed, suggest follow up testing of antithrombin III levels. Performed at [facility], [place] [address], [place], [place] [address]   Glucose, capillary     Status: Abnormal   Collection Time: 10/30/21  4:41 PM  Result Value Ref Range   Glucose-Capillary 297 (H) 70 - 99 mg/dL    Comment: Glucose reference range applies only to samples taken after fasting for at least 8 hours.    MICRO:  IMAGING: MR KNEE LEFT WO CONTRAST  Result Date: 10/30/2021 CLINICAL DATA:  Right knee pain. EXAM: MRI OF THE LEFT KNEE WITHOUT CONTRAST TECHNIQUE: Multiplanar, multisequence MR imaging of the knee was performed. No intravenous contrast was administered. COMPARISON:  Radiographs 10/28/2021 FINDINGS: Examination is extremely limited. There are 2 series other than the scout sequence all of which demonstrate significant motion artifact. There is a small to moderate-sized knee joint effusion. I do not see any grossly abnormal T2 signal intensity to suggest septic arthritis or osteomyelitis. There is diffuse subcutaneous soft tissue swelling/edema/fluid suggesting cellulitis there is a small fluid collection superficial to the lower lateral retinaculum which measures 19 mm and could be a small abscess. Suspect diffuse myofasciitis. IMPRESSION: 1. Very limited examination due to motion artifact. 2. Diffuse subcutaneous soft tissue swelling/edema/fluid suggesting cellulitis. Small fluid collection near the lateral retinaculum could be a small subcutaneous abscess. 3. Suspect mild myofasciitis without definite findings for pyomyositis. 4. Small to  moderate-sized knee joint effusion. 5. No grossly abnormal T2 signal intensity to suggest septic arthritis or osteomyelitis. Electronically Signed   By: Marijo Sanes M.D.   On: 10/30/2021 14:02   DG Chest Port 1 View  Result Date: 10/29/2021 CLINICAL DATA:  Septic shock, altered mental status EXAM: PORTABLE CHEST 1 VIEW COMPARISON:  09/17/2021, 10/28/2021 FINDINGS: Stable cardiomegaly. Pulmonary vascular congestion. Mild diffuse interstitial prominence. Streaky bibasilar opacities. No large pleural fluid collection. No pneumothorax. IMPRESSION: Cardiomegaly with pulmonary vascular congestion and mild interstitial edema. Streaky bibasilar opacities likely representing atelectasis. Electronically Signed   By: Davina Poke D.O.   On: 10/29/2021 08:53   DG Knee Left Port  Result Date: 10/28/2021 CLINICAL DATA:  Knee wound EXAM: PORTABLE LEFT KNEE - 1-2 VIEW COMPARISON:  None Available. FINDINGS: No evidence of fracture, dislocation, or joint effusion. No evidence of arthropathy or other focal bone abnormality. Soft tissues are unremarkable. IMPRESSION: Negative. Electronically Signed   By: Rolm Baptise M.D.   On: 10/28/2021 23:13   CT CHEST ABDOMEN PELVIS WO CONTRAST  Result Date: 10/28/2021 CLINICAL DATA:  Sepsis, fever EXAM: CT CHEST, ABDOMEN AND PELVIS WITHOUT CONTRAST TECHNIQUE: Multidetector CT imaging of the chest, abdomen and pelvis was performed following the standard protocol without IV contrast. RADIATION DOSE REDUCTION: This exam was performed according to the departmental dose-optimization program which includes automated exposure control, adjustment of the mA and/or kV according to patient size and/or use of iterative reconstruction technique. COMPARISON:  02/19/2021 FINDINGS: CT CHEST FINDINGS Cardiovascular: Coronary  artery and aortic calcifications. Heart is mildly enlarged. Aorta normal caliber. Mediastinum/Nodes: Mildly prominent mediastinal lymph nodes, stable since prior study, likely  reactive. No axillary adenopathy. Lungs/Pleura: No confluent opacities or effusions. Musculoskeletal: Chest wall soft tissues are unremarkable. No acute bony abnormality. CT ABDOMEN PELVIS FINDINGS Hepatobiliary: Nodular contours of the liver suggest cirrhosis. No focal hepatic abnormality. Prior cholecystectomy. Pancreas: No focal abnormality or ductal dilatation. Spleen: Spleen his upper limits normal in size at 12.5 cm. No focal abnormality. Adrenals/Urinary Tract: No adrenal abnormality. No focal renal abnormality. No stones or hydronephrosis. Urinary bladder is unremarkable. Stomach/Bowel: Sigmoid diverticulosis. No active diverticulitis. Stomach and small bowel decompressed, unremarkable. Vascular/Lymphatic: Aortic atherosclerosis. No evidence of aneurysm or adenopathy. Reproductive: Prior hysterectomy.  No adnexal masses. Other: No free fluid or free air. Edema throughout the abdominal wall subcutaneous soft tissues. Musculoskeletal: No acute bony abnormality. IMPRESSION: Cardiomegaly, coronary artery disease. Aortic atherosclerosis. No acute cardiopulmonary disease. Changes of cirrhosis. Sigmoid diverticulosis. Anasarca like edema throughout the abdominal wall. Electronically Signed   By: Rolm Baptise M.D.   On: 10/28/2021 20:37   DG Foot Complete Right  Result Date: 10/28/2021 CLINICAL DATA:  t brought in by family with complaints of fever and altered mental status. Incontinent of urine while staff attempting to get out of vehicle Family report cardiac procedure nuclear study toda EXAM: RIGHT FOOT COMPLETE - 3+ VIEW COMPARISON:  None Available. FINDINGS: There is no evidence of fracture or dislocation. Bite-like erosion of the base of the first digit fall fulfilling the overhanging edge. Degenerative changes of the distal interphalangeal joints. Plantar calcaneal spur. Cortical irregularity along the base of the fifth metatarsal likely old healed fracture. Soft tissues are unremarkable. IMPRESSION: Query  gouty changes of the base of the proximal phalanx of the first digit. No definite findings of acute osteomyelitis. If concern, consider MRI for further evaluation. Electronically Signed   By: Iven Finn M.D.   On: 10/28/2021 20:30   CT Head Wo Contrast  Result Date: 10/28/2021 CLINICAL DATA:  Altered mental status EXAM: CT HEAD WITHOUT CONTRAST TECHNIQUE: Contiguous axial images were obtained from the base of the skull through the vertex without intravenous contrast. RADIATION DOSE REDUCTION: This exam was performed according to the departmental dose-optimization program which includes automated exposure control, adjustment of the mA and/or kV according to patient size and/or use of iterative reconstruction technique. COMPARISON:  05/06/2012 FINDINGS: Brain: There is atrophy and chronic small vessel disease changes. No acute intracranial abnormality. Specifically, no hemorrhage, hydrocephalus, mass lesion, acute infarction, or significant intracranial injury. Vascular: No hyperdense vessel or unexpected calcification. Skull: No acute calvarial abnormality. Sinuses/Orbits: No acute findings Other: None IMPRESSION: Atrophy, chronic microvascular disease. No acute intracranial abnormality. Electronically Signed   By: Rolm Baptise M.D.   On: 10/28/2021 20:29   DG Foot Complete Left  Result Date: 10/28/2021 CLINICAL DATA:  Sore foot, fever EXAM: LEFT FOOT - COMPLETE 3+ VIEW COMPARISON:  None Available. FINDINGS: Postoperative changes in the midfoot and hindfoot from prior fusion. No acute bony abnormality. Specifically, no fracture, subluxation, or dislocation. No bone destruction to suggest osteomyelitis. Possible soft tissue ulcer at the base of the great toe. IMPRESSION: No acute bony abnormality. Electronically Signed   By: Rolm Baptise M.D.   On: 10/28/2021 20:28    Assessment/Plan:  77yo F with sepsis due to group b strep bacteremia.possibly nidus of infection could have been recent hematoma to left  knee that has been draining.imaging suggests effusion but unclear if she also has  septic arthritis  - recommend to treat as complicated bacteremia with 4 wk of IV abtx if unable to do aspiration of knee joint to send for cell count and culture - group b strep bacteremia - recommend to get TTE to see if signs of endocarditis; repeat blood cx today  Leukocytosis = improving  Left knee wound = continue with local wound care.  [facility] [phone]

## 2021-10-30 NOTE — TOC Initial Note (Addendum)
Transition of Care [facility]) - Initial/Assessment Note    Patient Details  Name: Kristine [name] MRN: [id number] Date of Birth: 30-Jan-1946  Transition of Care [facility]) CM/SW Contact:    Cyndi Bender, RN Phone Number: 10/30/2021, 1:19 PM  Clinical Narrative:                  Spoke to husband, [name], and daughter on the phone regarding transition needs. Patient is active with [facility] for home health.  Daughter is requesting new walker, Patient has been using husband's walker which is broken. DME walker ordered from [employer] to be delivered to the room prior to discharge.  Patient lives with husband and they have a private aide that comes in a few times a week. Daughter was asking about help with transportation to aps. Husband takes patient but he has trouble due to his own health issues. This RNCM referred the daughter to call patient's insurance to find out if they have transportation benefit.  Address, Phone number and PCP verified.  Patient will need resumption home health orders. TOC will continue to follow for needs. Expected Discharge Plan: [place] Barriers to Discharge: Continued Medical Work up   Patient Goals and CMS Choice Patient states their goals for this hospitalization and ongoing recovery are:: husband wants patient to return home CMS Medicare.gov Compare Post Acute Care list provided to:: Patient Represenative (must comment) (spouse) Choice offered to / list presented to : Spouse  Expected Discharge Plan and Services Expected Discharge Plan: [place]   Discharge Planning Services: CM Consult Post Acute Care Choice: Home Health, Durable Medical Equipment Living arrangements for the past 2 months: Single Family Home                                      Prior Living Arrangements/Services Living arrangements for the past 2 months: Single Family Home Lives with:: Spouse Patient language and need for interpreter  reviewed:: Yes Do you feel safe going back to the place where you live?: Yes      Need for Family Participation in Patient Care: Yes (Comment) Care giver support system in place?: Yes (comment) Current home services: Home OT, Home PT Criminal Activity/Legal Involvement Pertinent to Current Situation/Hospitalization: No - Comment as needed  Activities of Daily Living Home Assistive Devices/Equipment: Walker (specify type) (front wheel walker) ADL Screening (condition at time of admission) Is the patient deaf or have difficulty hearing?: No Does the patient have difficulty seeing, even when wearing glasses/contacts?: No Does the patient have difficulty concentrating, remembering, or making decisions?: Yes Patient able to express need for assistance with ADLs?: No Does the patient have difficulty dressing or bathing?: Yes Independently performs ADLs?: No  Permission Sought/Granted                  Emotional Assessment       Orientation: : Fluctuating Orientation (Suspected and/or reported Sundowners) Alcohol / Substance Use: Not Applicable Psych Involvement: No (comment)  Admission diagnosis:  Hypokalemia [E87.6] DNR (do not resuscitate) [Z66] Atrial fibrillation with rapid ventricular response ([place]) [I48.91] Acute respiratory failure with hypoxia ([place]) [J96.01] Septic shock ([place]) [A41.9, R65.21] Sepsis with encephalopathy and septic shock, due to unspecified organism ([place]) [A41.9, R65.21, G93.40] Patient Active Problem List   Diagnosis Date Noted   Pressure injury of skin 10/29/2021   Septic shock ([place]) 10/28/2021   Acute  pulmonary edema (HCC)    Acute right-sided heart failure (HCC)    CHF (congestive heart failure) ([place]) 09/11/2021   Hypokalemia 09/11/2021   Hematoma of left knee region 05/14/2021   DM type 2 with diabetic peripheral neuropathy ([place]) 01/07/2021   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 ([place]) 08/31/2019   Moderate nonproliferative  diabetic retinopathy of right eye ([place]) 08/31/2019   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   Chronic combined systolic and diastolic CHF (congestive heart failure) ([place]) 06/29/2019   Gout 01/03/2019   Pulmonary hypertension ([place]) 01/03/2019   Essential hypertension 01/03/2019   Candidiasis, intertriginous 01/03/2019   CKD (chronic kidney disease) stage 4, GFR 15-29 ml/min (HCC) 06/03/2018   Gait abnormality 02/09/2017   Obstructive sleep apnea 02/09/2017   Diabetic peripheral neuropathy ([place]) 09/02/2016   Peripheral neuropathy 07/29/2016   Spinal stenosis of lumbar region 07/29/2016   Excessive daytime sleepiness 07/29/2016   Morbid obesity ([place]) 07/29/2016   Anemia 05/31/2015   Acute on chronic combined systolic and diastolic CHF (congestive heart failure) ([place]) 05/31/2015   Paroxysmal atrial fibrillation ([place])    Obesity 91/79/1505   Diastolic dysfunction with chronic heart failure ([place]) 06/23/2013   Persistent atrial fibrillation ([place]) 06/20/2010   Pneumonia    Abnormal cardiovascular function study    High cholesterol    Scoliosis    Spinal stenosis    Anxiety    Osteoarthritis    Osteoporosis    Mild aortic sclerosis    Degenerative disc disease    Vitamin D deficiency    HEMOPTYSIS UNSPECIFIED 05/12/2010   Diabetes mellitus, type 2 ([place]) 11/19/2009   Hypertensive cardiovascular disease 11/19/2009   Dyspnea 11/19/2009   PCP:  Prince Solian, MD Pharmacy:   [facility] [place], [address] [place] [address] Phone: [phone] Fax: [place] [place] [address] Phone: [phone] Fax: [phone]  [facility] [address] [place] [address] Phone: [phone] Fax: [phone]  [id number] [id number]: [phone]:  [phone]    Social Determinants of Health (SDOH) Interventions    Readmission Risk Interventions    09/12/2021   12:58 PM  Readmission Risk Prevention Plan  Transportation Screening Complete  PCP or Specialist Appt within 3-5 Days Complete  Social Work Consult for RLa VerkinPlanning/Counseling Complete  Palliative Care Screening Not Applicable  Medication Review (Press photographer Complete

## 2021-10-30 NOTE — Progress Notes (Signed)
PROGRESS NOTE    Kristine [name]  RUE:[id number] DOB: 1946/02/01 DOA: 10/28/2021 PCP: Prince Solian, MD    Chief Complaint  Patient presents with   Altered Mental Status    Brief Narrative:   76 year old woman who presented to [facility] 8/8 with fatigue and AMS. PMHx significant for HTN, HLD, AF (previously on Eliquis), mild AS, CHF with cardiac amyloidosis (TTR, diagnosed 10/2021), COPD with chronic bronchitis, OSA, T2DM, hypothyroidism, CKD stage IV, GERD, depression/anxiety.   Per chart review/patient's husband, patient returned from [place] scan 8/8 and appeared extremely weak/fatigued and unable to get from the car to the house. Shortly after arriving home, patient c/o chills/being "freezing" and progressively became altered with increasing confusion and tachypnea. Tmax 101F measured at home and Tylenol was given. Of note, patient has had longstanding sores on bilateral feet (slow to heal in the setting of DM) and L knee (occurred s/p hematoma drainage).   On ED arrival, patient was febrile to 101.33F, tachycardic to 125, hypoxic to 70s on RA, hypotensive with BP 91/45 and altered. Labs were notable for WBC 26, H&H/Plt/INR WNL, Na 134, K 2.7, BUN 89/Cr 2.86, Tbili 2.2, LA 2.8, BNP 276, Trop 53. VBG demonstrated pH 7.398, pCO2 50.7. UA was unremarkable and BCx were obtained. CT Head NAICA, +atrophy and chronic microvascular disease. CT Chest/A/P without active CP disease, changes c/w cirrhosis, sigmoid diverticulitis and anasarca. XR of L knee was negative for acute abnormality and XR bilateral feet were obtained (L negative, R ?gouty changes at base of proximal phalanx of 1st digit, no definite osteomyelitis). Broad spectrum antibiotics were initiated and peripheral NE started for pressor support.   Given patient's ongoing hypotension, fever and AMS with concern for septic shock, patient was transferred to [facility] for further evaluation and workup. Patient admistted to ICU 10/29/2021    Assessment  & Plan:   Principal Problem:   Septic shock ([place]) Active Problems:   Pressure injury of skin  Septic shock secondary to bacteremia -Sepsis present on admission, hypotensive requiring pressors, leukocytosis, elevated lactic acid -Blood culture growing group be Streptococcus -With IV cefazolin -Positive bacteremia not clear yet, left knee MRI is done, results are pending, bilateral feet MRI still pending    Acute metabolic encephalopathy -Due to infection, CT head with no acute findings, ammonia is 38, urine analysis within normal limit -Significantly improved -Now mentation has improved we will resume Cymbalta and Lyrica  Longstanding ulcerations of BLE (feet) L knee wound, nonhealing -underwent drainage of left knee hematoma and placement of drain 3/20203 by Dr. Rae Roam evacuation by Dr. Marla Roe -concern for possible osteomyelitis of feet or septic arthritis or cellulitis of knee -XR without any signs of infection -Patient recently treated with doxy for knee a week ago for possible cellulitis MRI of left knee is poor quality but I have requested the radiologist to look at it to see if any evidence of infection, patient could not tolerate MRI of her feet   Hypokalemia AKI on CKD stage IV P: -repleted K; repeat bmp pending -Trend BMP / urinary output -Replace electrolytes as indicated -Avoid nephrotoxic agents, ensure adequate renal perfusion   Hypoglycemia T2DM Peripheral neuropathy -She is with significant insulin requirement at home, CBG started to increase will resume at a low dose Semglee and insulin sliding scale -Now mentation has improved we will resume Cymbalta and Lyrica   Acute hypoxemic respiratory failure/insufficiency COPD with chronic bronchitis OSA: no cpap at home P: -continue to wean O2 for sats >92% -cpap qhs when  less altered and safe to use -prn xopenex for wheezing   Atrial fibrillation History of persistent AF. Previously on Eliquis.  Hx of amio toxicity in 2017 Management per cardiology, currently on Cardizem drip and heparin GTT  CHF Cardiac amyloidosis, TTR Pulm HTN Mild AS HTN HLD Echo 08/2021 with EF 55-60%, no RWMAs, mild LVH, D-shaped septum in diastole c/w severely elevated PASP, reduced RV function. Management per cardiology   GERD - PPI when able to take PO   Gout Resume allopurinol   Hypothyroidism P: - Resume home synthroid when able to take po   Depression Anxiety P: - hold xanax  Morbid obesity Body mass index is 44.02 kg/m.  DVT prophylaxis: Heparin GTT Code Status: Partial Family Communication: D/W daughter at bedside Disposition:   Status is: Inpatient    Consultants:  PCCM     Subjective:  Could not sleep, good bowel movement, no chest pain or shortness of breath  Objective: Vitals:   10/30/21 0600 10/30/21 0700 10/30/21 0806 10/30/21 1148  BP: 111/82 128/73 117/68 120/80  Pulse: 82 79 78 77  Resp: '18 17 15 16  '$ Temp:   (!) 97.3 F (36.3 C)   TempSrc:   Oral   SpO2: 94% 93% 95%   Weight:      Height:        Intake/Output Summary (Last 24 hours) at 10/30/2021 1326 Last data filed at 10/30/2021 0300 Gross per 24 hour  Intake 417.63 ml  Output 400 ml  Net 17.63 ml   Filed Weights   10/28/21 1911 10/29/21 0111 10/30/21 0554  Weight: 132.5 kg (!) 136.7 kg 135.2 kg    Examination:  Awake Alert, Oriented X 3, No new F.N deficits, Normal affect Symmetrical Chest wall movement, Good air movement bilaterally, CTAB Irregular irregular,No Gallops,Rubs or new Murmurs, No Parasternal Heave +ve B.Sounds, Abd Soft, No tenderness, No rebound - guarding or rigidity. No Cyanosis, Clubbing or edema, No new Rash or bruise      Data Reviewed: I have personally reviewed following labs and imaging studies  CBC: Recent Labs  Lab 10/28/21 1920 10/28/21 1923 10/29/21 0201 10/29/21 0439 10/30/21 0058  WBC 26.0*  --  40.6* 43.8* 24.3*  NEUTROABS 24.0*  --  37.3*  --    --   HGB 11.8* 14.6 9.3* 10.0* 10.7*  HCT 36.6 43.0 29.0* 30.0* 32.4*  MCV 88.8  --  89.5 88.0 87.6  PLT 192  --  138* 142* 141*    Basic Metabolic Panel: Recent Labs  Lab 10/28/21 1920 10/28/21 1923 10/28/21 1944 10/29/21 0439 10/29/21 1009 10/29/21 1925 10/30/21 0058  NA 134* 135  --  134* 134* 132* 132*  K 2.7* 2.7*  --  3.0* 3.1* 4.1 4.2  CL 91*  --   --  93* 95* 93* 93*  CO2 28  --   --  '27 26 22 26  '$ GLUCOSE 117*  --   --  82 94 145* 200*  BUN 89*  --   --  85* 86* 88* 90*  CREATININE 2.86*  --   --  2.87* 2.91* 2.80* 2.98*  CALCIUM 9.6  --   --  8.8* 8.9 9.0 9.2  MG  --   --  2.4 2.3  --   --   --   PHOS  --   --   --  4.8*  --   --   --     GFR: Estimated Creatinine Clearance: 24.5 mL/min (A) (by C-G  formula based on SCr of 2.98 mg/dL (H)).  Liver Function Tests: Recent Labs  Lab 10/28/21 1920 10/29/21 0439  AST 21 16  ALT 9 9  ALKPHOS 110 76  BILITOT 2.2* 2.1*  PROT 7.8 5.7*  ALBUMIN 4.0 2.9*    CBG: Recent Labs  Lab 10/29/21 2346 10/30/21 0356 10/30/21 0630 10/30/21 0809 10/30/21 1229  GLUCAP 206* 210* 217* 204* 279*     Recent Results (from the past 240 hour(s))  SARS Coronavirus 2 by RT PCR (hospital order, performed in [facility] hospital lab) *cepheid single result test* Anterior Nasal Swab     Status: None   Collection Time: 10/28/21  6:50 PM   Specimen: Anterior Nasal Swab  Result Value Ref Range Status   SARS Coronavirus 2 by RT PCR NEGATIVE NEGATIVE Final    Comment: (NOTE) SARS-CoV-2 target nucleic acids are NOT DETECTED.  The SARS-CoV-2 RNA is generally detectable in upper and lower respiratory specimens during the acute phase of infection. The lowest concentration of SARS-CoV-2 viral copies this assay can detect is 250 copies / mL. A negative result does not preclude SARS-CoV-2 infection and should not be used as the sole basis for treatment or other patient management decisions.  A negative result may occur with improper  specimen collection / handling, submission of specimen other than nasopharyngeal swab, presence of viral mutation(s) within the areas targeted by this assay, and inadequate number of viral copies (<250 copies / mL). A negative result must be combined with clinical observations, patient history, and epidemiological information.  Fact Sheet for Patients:   https://www.patel.info/  Fact Sheet for Healthcare Providers: https://hall.com/  This test is not yet approved or  cleared by the [place] FDA and has been authorized for detection and/or diagnosis of SARS-CoV-2 by FDA under an Emergency Use Authorization (EUA).  This EUA will remain in effect (meaning this test can be used) for the duration of the COVID-19 declaration under Section 564(b)(1) of the Act, 21 U.S.C. section 360bbb-3(b)(1), unless the authorization is terminated or revoked sooner.  Performed at [facility], [address] [place], [place] [address]   Culture, blood (Routine x 2)     Status: Abnormal (Preliminary result)   Collection Time: 10/28/21  7:20 PM   Specimen: Left Antecubital; Blood  Result Value Ref Range Status   Specimen Description   Final    LEFT ANTECUBITAL BLOOD Performed at [facility], [place] [address], [place], [place] [address]    Special Requests   Final    BOTTLES DRAWN AEROBIC AND ANAEROBIC Blood Culture adequate volume Performed at [facility], [address] [place], [place] [address]    Culture  Setup Time   Final    GRAM POSITIVE COCCI IN BOTH AEROBIC AND ANAEROBIC BOTTLES CRITICAL RESULT CALLED TO, READ BACK BY AND VERIFIED WITH: PHARMD A. PAYTES [id number] '@1039'$  FH Performed at [facility] [address] [place], [place] [address]    Culture GROUP B STREP(S.AGALACTIAE)ISOLATED (A)  Final   Report Status PENDING  Incomplete  Culture, blood (Routine x 2)     Status: Abnormal (Preliminary result)    Collection Time: 10/28/21  7:25 PM   Specimen: BLOOD LEFT ARM  Result Value Ref Range Status   Specimen Description   Final    BLOOD LEFT ARM Performed at [facility], [address] [place], [place] [address]    Special Requests   Final  BOTTLES DRAWN AEROBIC ONLY Blood Culture results may not be optimal due to an inadequate volume of blood received in culture bottles Performed at [facility], [address] [place], [place] [address]    Culture  Setup Time   Final    GRAM POSITIVE COCCI IN CHAINS BOTTLES DRAWN AEROBIC ONLY CRITICAL RESULT CALLED TO, READ BACK BY AND VERIFIED WITH: PHARMD A. PAYTES [id number] '@1039'$  FH    Culture (A)  Final    GROUP B STREP(S.AGALACTIAE)ISOLATED SUSCEPTIBILITIES TO FOLLOW Performed at [facility] [address] [place], [place] [address]    Report Status PENDING  Incomplete  Blood Culture ID Panel (Reflexed)     Status: Abnormal   Collection Time: 10/28/21  7:25 PM  Result Value Ref Range Status   Enterococcus faecalis NOT DETECTED NOT DETECTED Final   Enterococcus Faecium NOT DETECTED NOT DETECTED Final   Listeria monocytogenes NOT DETECTED NOT DETECTED Final   Staphylococcus species NOT DETECTED NOT DETECTED Final   Staphylococcus aureus (BCID) NOT DETECTED NOT DETECTED Final   Staphylococcus epidermidis NOT DETECTED NOT DETECTED Final   Staphylococcus lugdunensis NOT DETECTED NOT DETECTED Final   Streptococcus species DETECTED (A) NOT DETECTED Final    Comment: CRITICAL RESULT CALLED TO, READ BACK BY AND VERIFIED WITH: PHARMD A. PAYTES [id number] '@1039'$  FH    Streptococcus agalactiae DETECTED (A) NOT DETECTED Final    Comment: CRITICAL RESULT CALLED TO, READ BACK BY AND VERIFIED WITH: PHARMD A. PAYTES [id number] '@1039'$  FH    Streptococcus pneumoniae NOT DETECTED NOT DETECTED Final   Streptococcus pyogenes NOT DETECTED NOT DETECTED Final   A.calcoaceticus-baumannii NOT DETECTED NOT DETECTED Final   Bacteroides  fragilis NOT DETECTED NOT DETECTED Final   Enterobacterales NOT DETECTED NOT DETECTED Final   Enterobacter cloacae complex NOT DETECTED NOT DETECTED Final   Escherichia coli NOT DETECTED NOT DETECTED Final   Klebsiella aerogenes NOT DETECTED NOT DETECTED Final   Klebsiella oxytoca NOT DETECTED NOT DETECTED Final   Klebsiella pneumoniae NOT DETECTED NOT DETECTED Final   Proteus species NOT DETECTED NOT DETECTED Final   Salmonella species NOT DETECTED NOT DETECTED Final   Serratia marcescens NOT DETECTED NOT DETECTED Final   Haemophilus influenzae NOT DETECTED NOT DETECTED Final   Neisseria meningitidis NOT DETECTED NOT DETECTED Final   Pseudomonas aeruginosa NOT DETECTED NOT DETECTED Final   Stenotrophomonas maltophilia NOT DETECTED NOT DETECTED Final   Candida albicans NOT DETECTED NOT DETECTED Final   Candida auris NOT DETECTED NOT DETECTED Final   Candida glabrata NOT DETECTED NOT DETECTED Final   Candida krusei NOT DETECTED NOT DETECTED Final   Candida parapsilosis NOT DETECTED NOT DETECTED Final   Candida tropicalis NOT DETECTED NOT DETECTED Final   Cryptococcus neoformans/gattii NOT DETECTED NOT DETECTED Final    Comment: Performed at [facility], [address], [place], [place] [address]  MRSA Next Gen by PCR, Nasal     Status: None   Collection Time: 10/29/21  1:06 AM   Specimen: Nasal Mucosa; Nasal Swab  Result Value Ref Range Status   MRSA by PCR Next Gen NOT DETECTED NOT DETECTED Final    Comment: (NOTE) The GeneXpert MRSA Assay (FDA approved for NASAL specimens only), is one component of a comprehensive MRSA colonization surveillance program. It is not intended to diagnose MRSA infection nor to guide or monitor treatment for MRSA infections. Test performance is not FDA approved in patients less than 75 years old. Performed at [facility], [address], [place], [place] [address]          Radiology Studies: DG Chest Port 1 View  Result Date:  10/29/2021 CLINICAL DATA:  Septic shock, altered mental status EXAM: PORTABLE CHEST 1 VIEW COMPARISON:  09/17/2021, 10/28/2021 FINDINGS: Stable cardiomegaly. Pulmonary vascular congestion. Mild diffuse interstitial prominence. Streaky bibasilar opacities. No large pleural fluid collection. No pneumothorax. IMPRESSION: Cardiomegaly with pulmonary vascular congestion and mild interstitial edema. Streaky bibasilar opacities likely representing atelectasis. Electronically Signed   By: Davina Poke D.O.   On: 10/29/2021 08:53   DG Knee Left Port  Result Date: 10/28/2021 CLINICAL DATA:  Knee wound EXAM: PORTABLE LEFT KNEE - 1-2 VIEW COMPARISON:  None Available. FINDINGS: No evidence of fracture, dislocation, or joint effusion. No evidence of arthropathy or other focal bone abnormality. Soft tissues are unremarkable. IMPRESSION: Negative. Electronically Signed   By: Rolm Baptise M.D.   On: 10/28/2021 23:13   CT CHEST ABDOMEN PELVIS WO CONTRAST  Result Date: 10/28/2021 CLINICAL DATA:  Sepsis, fever EXAM: CT CHEST, ABDOMEN AND PELVIS WITHOUT CONTRAST TECHNIQUE: Multidetector CT imaging of the chest, abdomen and pelvis was performed following the standard protocol without IV contrast. RADIATION DOSE REDUCTION: This exam was performed according to the departmental dose-optimization program which includes automated exposure control, adjustment of the mA and/or kV according to patient size and/or use of iterative reconstruction technique. COMPARISON:  02/19/2021 FINDINGS: CT CHEST FINDINGS Cardiovascular: Coronary artery and aortic calcifications. Heart is mildly enlarged. Aorta normal caliber. Mediastinum/Nodes: Mildly prominent mediastinal lymph nodes, stable since prior study, likely reactive. No axillary adenopathy. Lungs/Pleura: No confluent opacities or effusions. Musculoskeletal: Chest wall soft tissues are unremarkable. No acute bony abnormality. CT ABDOMEN PELVIS FINDINGS Hepatobiliary: Nodular contours of the  liver suggest cirrhosis. No focal hepatic abnormality. Prior cholecystectomy. Pancreas: No focal abnormality or ductal dilatation. Spleen: Spleen his upper limits normal in size at 12.5 cm. No focal abnormality. Adrenals/Urinary Tract: No adrenal abnormality. No focal renal abnormality. No stones or hydronephrosis. Urinary bladder is unremarkable. Stomach/Bowel: Sigmoid diverticulosis. No active diverticulitis. Stomach and small bowel decompressed, unremarkable. Vascular/Lymphatic: Aortic atherosclerosis. No evidence of aneurysm or adenopathy. Reproductive: Prior hysterectomy.  No adnexal masses. Other: No free fluid or free air. Edema throughout the abdominal wall subcutaneous soft tissues. Musculoskeletal: No acute bony abnormality. IMPRESSION: Cardiomegaly, coronary artery disease. Aortic atherosclerosis. No acute cardiopulmonary disease. Changes of cirrhosis. Sigmoid diverticulosis. Anasarca like edema throughout the abdominal wall. Electronically Signed   By: Rolm Baptise M.D.   On: 10/28/2021 20:37   DG Foot Complete Right  Result Date: 10/28/2021 CLINICAL DATA:  t brought in by family with complaints of fever and altered mental status. Incontinent of urine while staff attempting to get out of vehicle Family report cardiac procedure nuclear study toda EXAM: RIGHT FOOT COMPLETE - 3+ VIEW COMPARISON:  None Available. FINDINGS: There is no evidence of fracture or dislocation. Bite-like erosion of the base of the first digit fall fulfilling the overhanging edge. Degenerative changes of the distal interphalangeal joints. Plantar calcaneal spur. Cortical irregularity along the base of the fifth metatarsal likely old healed fracture. Soft tissues are unremarkable. IMPRESSION: Query gouty changes of the base of the proximal phalanx of the first digit. No definite findings of acute osteomyelitis. If concern, consider MRI for further evaluation. Electronically Signed   By: Iven Finn M.D.   On: 10/28/2021 20:30    CT Head Wo Contrast  Result Date: 10/28/2021 CLINICAL DATA:  Altered mental status EXAM: CT HEAD WITHOUT  CONTRAST TECHNIQUE: Contiguous axial images were obtained from the base of the skull through the vertex without intravenous contrast. RADIATION DOSE REDUCTION: This exam was performed according to the departmental dose-optimization program which includes automated exposure control, adjustment of the mA and/or kV according to patient size and/or use of iterative reconstruction technique. COMPARISON:  05/06/2012 FINDINGS: Brain: There is atrophy and chronic small vessel disease changes. No acute intracranial abnormality. Specifically, no hemorrhage, hydrocephalus, mass lesion, acute infarction, or significant intracranial injury. Vascular: No hyperdense vessel or unexpected calcification. Skull: No acute calvarial abnormality. Sinuses/Orbits: No acute findings Other: None IMPRESSION: Atrophy, chronic microvascular disease. No acute intracranial abnormality. Electronically Signed   By: Rolm Baptise M.D.   On: 10/28/2021 20:29   DG Foot Complete Left  Result Date: 10/28/2021 CLINICAL DATA:  Sore foot, fever EXAM: LEFT FOOT - COMPLETE 3+ VIEW COMPARISON:  None Available. FINDINGS: Postoperative changes in the midfoot and hindfoot from prior fusion. No acute bony abnormality. Specifically, no fracture, subluxation, or dislocation. No bone destruction to suggest osteomyelitis. Possible soft tissue ulcer at the base of the great toe. IMPRESSION: No acute bony abnormality. Electronically Signed   By: Rolm Baptise M.D.   On: 10/28/2021 20:28        Scheduled Meds:  [START ON 10/31/2021] allopurinol  300 mg Oral q AM   [START ON 10/31/2021] DULoxetine  30 mg Oral Daily   furosemide  120 mg Oral BID   insulin aspart  0-20 Units Subcutaneous TID WC   insulin aspart  0-5 Units Subcutaneous QHS   insulin glargine-yfgn  25 Units Subcutaneous Daily   levothyroxine  88 mcg Oral q morning   metoprolol tartrate   12.5 mg Oral BID   pregabalin  50 mg Oral BID   [START ON 10/31/2021] Vitamin D (Ergocalciferol)  50,000 Units Oral Q7 days   Continuous Infusions:   ceFAZolin (ANCEF) IV 2 g (10/30/21 0924)   heparin 1,500 Units/hr (10/30/21 0533)     LOS: 1 day       Phillips Climes, MD [facility]   To contact the attending provider between 7A-7P or the covering provider during after hours 7P-7A, please log into the web site [email or web address] and access using universal [place] password for that web site. If you do not have the password, please call the hospital operator.  10/30/2021, 1:26 PM

## 2021-10-30 NOTE — Progress Notes (Signed)
[place] for Heparin Indication: atrial fibrillation Brief A/P: Heparin level within goal range Continue Heparin at current rate   Allergies  Allergen Reactions   Albuterol Other (See Comments)    REACTION: Tachycardia- AFib   Epinephrine Other (See Comments)    Increases heart rate per patient.    Penicillins Other (See Comments)    REACTION: flushing' \\T'$ \ hot  Did it involve swelling of the face/tongue/throat, SOB, or low BP? No Did it involve sudden or severe rash/hives, skin peeling, or any reaction on the inside of your mouth or nose? No Did you need to seek medical attention at a hospital or doctor's office? No When did it last happen?    20 years ago   If all above answers are "NO", may proceed with cephalosporin use.   Ceftriaxone Other (See Comments)    Sweats   Citalopram Hydrobromide Other (See Comments)    Prolonged QT prolongation   Exenatide Other (See Comments)    Unknown reaction   Ketorolac Tromethamine Hives and Swelling    swelling in hands   Tizanidine Other (See Comments)    lethargic   Torsemide Swelling   Zoledronic Acid Other (See Comments)    Pt was hospitalized due to medication   Bee Venom Other (See Comments)    "makes me nervous"   Ivp Dye [Iodinated Contrast Media] Other (See Comments)    flushing   Keflex [Cephalexin] Other (See Comments)    Makes her feel warm; she was told never to take it.    Patient Measurements: Height: '5\' 9"'$  (175.3 cm) Weight: (!) 136.7 kg (301 lb 5.9 oz) IBW/kg (Calculated) : 66.2 Heparin Dosing Weight: 99 kg  Vital Signs: Temp: 97.8 F (36.6 C) (08/09 2343) Temp Source: Oral (08/09 2343) BP: 115/71 (08/10 0215) Pulse Rate: 99 (08/10 0215)  Labs: Recent Labs    10/28/21 1920 10/28/21 1923 10/28/21 1944 10/28/21 1945 10/28/21 2139 10/29/21 0201 10/29/21 0439 10/29/21 1009 10/29/21 1925 10/30/21 0058  HGB 11.8*   < >  --   --   --  9.3* 10.0*  --   --  10.7*   HCT 36.6   < >  --   --   --  29.0* 30.0*  --   --  32.4*  PLT 192  --   --   --   --  138* 142*  --   --  141*  APTT  --   --  36  --   --   --   --   --   --   --   LABPROT 14.8  --   --   --   --   --   --   --   --   --   INR 1.2  --   --   --   --   --   --   --   --   --   HEPARINUNFRC  --   --   --   --   --   --   --   --   --  0.30  CREATININE 2.86*  --   --   --   --   --  2.87* 2.91* 2.80* 2.98*  TROPONINIHS  --   --   --  53* 84*  --   --   --   --   --    < > = values in this interval not displayed.  Estimated Creatinine Clearance: 24.7 mL/min (A) (by C-G formula based on SCr of 2.98 mg/dL (H)).  Assessment: 76 y.o. female with Afib, Xarelto on hold, for heparin  Goal of Therapy:  Heparin level 0.3-0.7 units/ml Monitor platelets by anticoagulation protocol: Yes   Plan:  Continue Heparin at current rate   Phillis Knack, PharmD, BCPS  10/30/2021,2:24 AM

## 2021-10-30 NOTE — Evaluation (Signed)
Occupational Therapy Evaluation Patient Details Name: Kristine [name] MRN: [id number] DOB: 06/07/1945 Today's Date: 10/30/2021   History of Present Illness 76 year old woman who presented to [facility] 8/8 with fatigue and AMS. PMHx significant for HTN, HLD, AF (previously on Eliquis), mild AS, CHF with cardiac amyloidosis (TTR, diagnosed 10/2021), COPD with chronic bronchitis, OSA, T2DM, hypothyroidism, CKD stage IV, GERD, depression/anxiety.  Recent hospitalization 6/23 with [place] OT and PT.   Clinical Impression   Patient admitted for the diagnosis above.  PTA she lives at home with her spouse, who assists as needed, and they also have PCA assist 2x/wk.  Both spouse and son endorse she is very close to her baseline, and are looking to resume [facility] OT once discharged.  OT will follow in the acute setting, with continued [facility] services recommended.        Recommendations for follow up therapy are one component of a multi-disciplinary discharge planning process, led by the attending physician.  Recommendations may be updated based on patient status, additional functional criteria and insurance authorization.   Follow Up Recommendations  Home health OT    Assistance Recommended at Discharge Intermittent Supervision/Assistance  Patient can return home with the following A lot of help with bathing/dressing/bathroom;A little help with walking and/or transfers;Help with stairs or ramp for entrance;Assist for transportation;Assistance with cooking/housework    Functional Status Assessment  Patient has had a recent decline in their functional status and demonstrates the ability to make significant improvements in function in a reasonable and predictable amount of time.  Equipment Recommendations  None recommended by OT    Recommendations for Other Services       Precautions / Restrictions Precautions Precautions: Fall Precaution Comments: open area to L plantar surface near great  toe. Restrictions Weight Bearing Restrictions: No Other Position/Activity Restrictions: Has shoes in room that need to be on when OOB      Mobility Bed Mobility Overal bed mobility: Needs Assistance Bed Mobility: Sidelying to Sit   Sidelying to sit: Min assist            Transfers Overall transfer level: Needs assistance   Transfers: Sit to/from Stand Sit to Stand: Min assist                  Balance Overall balance assessment: Needs assistance Sitting-balance support: Feet supported Sitting balance-Leahy Scale: Good     Standing balance support: Reliant on assistive device for balance Standing balance-Leahy Scale: Poor                             ADL either performed or assessed with clinical judgement   ADL       Grooming: Wash/dry hands;Wash/dry face;Set up;Sitting           Upper Body Dressing : Set up;Sitting   Lower Body Dressing: Moderate assistance;Sit to/from stand;Maximal assistance   Toilet Transfer: Minimal assistance;Rolling walker (2 wheels);Regular Toilet                   Vision Patient Visual Report: No change from baseline       Perception Perception Perception: Not tested   Praxis Praxis Praxis: Not tested    Pertinent Vitals/Pain Pain Assessment Pain Assessment: No/denies pain Pain Intervention(s): Monitored during session     Hand Dominance Right   Extremity/Trunk Assessment Upper Extremity Assessment Upper Extremity Assessment: Generalized weakness   Lower Extremity Assessment Lower Extremity Assessment: Defer to PT evaluation  Communication Communication Communication: No difficulties   Cognition Arousal/Alertness: Awake/alert Behavior During Therapy: WFL for tasks assessed/performed Overall Cognitive Status: Within Functional Limits for tasks assessed                                       General Comments   VSS    Exercises     Shoulder Instructions       Home Living Family/patient expects to be discharged to:: Private residence Living Arrangements: Spouse/significant other Available Help at Discharge: Family;Available 24 hours/day Type of Home: House Home Access: Ramped entrance     Home Layout: Two level;Able to live on main level with bedroom/bathroom     Bathroom Shower/Tub: Occupational psychologist: Handicapped height Bathroom Accessibility: Yes How Accessible: Accessible via walker Home Equipment: [place] (2 wheels);Rollator (4 wheels);Shower seat - built in;BSC/3in1;Grab bars - toilet;Grab bars - tub/shower;Wheelchair - manual;Hand held shower head;Hospital bed          Prior Functioning/Environment Prior Level of Function : Needs assist             Mobility Comments: Spouse is present for all mobility. ADLs Comments: Needs assist with all LB ADL's and iADL.  Spouse assists with community mobility.        OT Problem List: Decreased strength;Impaired balance (sitting and/or standing);Decreased activity tolerance      OT Treatment/Interventions: Self-care/ADL training;Therapeutic activities;Balance training;Patient/family education;DME and/or AE instruction    OT Goals(Current goals can be found in the care plan section) Acute Rehab OT Goals Patient Stated Goal: Hoping to return home in a few days. OT Goal Formulation: With patient Time For Goal Achievement: 11/13/21 Potential to Achieve Goals: Good ADL Goals Pt Will Perform Grooming: with supervision;standing;sitting Pt Will Transfer to Toilet: with supervision;ambulating;regular height toilet Pt/caregiver will Perform Home Exercise Program: Increased strength;Both right and left upper extremity;With theraband;With written HEP provided  OT Frequency: Min 2X/week    Co-evaluation              AM-PAC OT "6 Clicks" Daily Activity     Outcome Measure Help from another person eating meals?: None Help from another person taking care of  personal grooming?: None Help from another person toileting, which includes using toliet, bedpan, or urinal?: A Lot Help from another person bathing (including washing, rinsing, drying)?: A Lot Help from another person to put on and taking off regular upper body clothing?: A Little Help from another person to put on and taking off regular lower body clothing?: A Lot 6 Click Score: 17   End of Session Equipment Utilized During Treatment: Rolling walker (2 wheels) Nurse Communication: Mobility status  Activity Tolerance: Patient tolerated treatment well Patient left: in bed;with call bell/phone within reach;with family/visitor present  OT Visit Diagnosis: Unsteadiness on feet (R26.81);Muscle weakness (generalized) (M62.81)                Time: 1539-1600 OT Time Calculation (min): 21 min Charges:  OT General Charges $OT Visit: 1 Visit OT Evaluation $OT Eval Moderate Complexity: 1 Mod  10/30/2021  RP, OTR/L  Acute Rehabilitation Services  Office:  [phone]   Metta Clines 10/30/2021, 4:16 PM

## 2021-10-30 NOTE — Progress Notes (Signed)
I was called earlier by Dr Waldron Labs about evaluating the patients left leg as she had seen Dr Tonita Cong earlier this year for a leg hematoma. In review of her records, he referred her to Plastic Surgery for wound management and she has been treated by Dr Erin Hearing in Plastic Surgery for this problem and had surgical drainage with drain placement on 05/21/21. She was just seen in their office for a wound over the same leg in the same area on 10/24/21. As she is being actively seen by another physician for this problem, I feel that she should be evaluated by the treating team for continuity of care. Please call if we are needed for evaluation beyond this.

## 2021-10-30 NOTE — Evaluation (Signed)
Physical Therapy Evaluation Patient Details Name: Kristine [name] MRN: [id number] DOB: 08-23-1945 Today's Date: 10/30/2021  History of Present Illness  76 year old woman who presented to [facility] 8/8 with fatigue and AMS. PMHx significant for HTN, HLD, AF (previously on Eliquis), mild AS, CHF with cardiac amyloidosis (TTR, diagnosed 10/2021), COPD with chronic bronchitis, OSA, T2DM, hypothyroidism, CKD stage IV, GERD, depression/anxiety.  Recent hospitalization 6/23 with [place] OT and PT.  Clinical Impression  Patient presents with decreased mobility due to deficits listed in PT problem list.  Currently patient needing min A for short distance ambulation.  She was needing close supervision for when she was up on her feet at home previously.  She had just completed HHPT last week.  She will benefit from continued skilled PT in the acute setting and from starting HHPT again at d/c.        Recommendations for follow up therapy are one component of a multi-disciplinary discharge planning process, led by the attending physician.  Recommendations may be updated based on patient status, additional functional criteria and insurance authorization.  Follow Up Recommendations Home health PT      Assistance Recommended at Discharge Frequent or constant Supervision/Assistance  Patient can return home with the following  A little help with walking and/or transfers;A little help with bathing/dressing/bathroom;Help with stairs or ramp for entrance;Assist for transportation    Equipment Recommendations None recommended by PT  Recommendations for Other Services       Functional Status Assessment Patient has had a recent decline in their functional status and demonstrates the ability to make significant improvements in function in a reasonable and predictable amount of time.     Precautions / Restrictions Precautions Precautions: Fall Precaution Comments: open area to L plantar surface near great  toe. Restrictions Weight Bearing Restrictions: No Other Position/Activity Restrictions: Has shoes in room that need to be on when OOB      Mobility  Bed Mobility Overal bed mobility: Needs Assistance Bed Mobility: Sit to Supine       Sit to supine: Mod assist   General bed mobility comments: assist for legs onto bed and repositioning trunk    Transfers Overall transfer level: Needs assistance Equipment used: Rolling walker (2 wheels) Transfers: Sit to/from Stand Sit to Stand: Min guard, From elevated surface           General transfer comment: increased time and effortful to stand, assist for balance    Ambulation/Gait               General Gait Details: Just finished walking some in room with OT (assisted with line management during pt turning to sit on EOB)  too fatigued to walk further  Stairs            Wheelchair Mobility    Modified Rankin (Stroke Patients Only)       Balance   Sitting-balance support: Feet supported Sitting balance-Leahy Scale: Good     Standing balance support: Bilateral upper extremity supported, Reliant on assistive device for balance Standing balance-Leahy Scale: Poor                               Pertinent Vitals/Pain Pain Assessment Pain Assessment: No/denies pain    Home Living Family/patient expects to be discharged to:: Private residence Living Arrangements: Spouse/significant other Available Help at Discharge: Family;Available 24 hours/day Type of Home: House Home Access: Ramped entrance  Home Layout: One level;Able to live on main level with bedroom/bathroom Home Equipment: Rolling Walker (2 wheels);Rollator (4 wheels);Shower seat - built in;BSC/3in1;Grab bars - toilet;Grab bars - tub/shower;Wheelchair - manual;Hand held shower head;Hospital bed      Prior Function Prior Level of Function : Needs assist             Mobility Comments: Spouse is present for all mobility. ADLs  Comments: Needs assist with all LB ADL's and iADL.  Spouse assists with community mobility.     Hand Dominance   Dominant Hand: Right    Extremity/Trunk Assessment   Upper Extremity Assessment Upper Extremity Assessment: Defer to OT evaluation    Lower Extremity Assessment Lower Extremity Assessment: RLE deficits/detail;LLE deficits/detail RLE Deficits / Details: AROM WFL, strength grossly 4/5, some neuropathy in feet RLE Sensation: decreased light touch RLE Coordination: decreased gross motor LLE Deficits / Details: AROM WFL, strength grossly 4/5, states absent sensation in L foot, edema throughout more than R LLE Sensation: history of peripheral neuropathy LLE Coordination: decreased gross motor       Communication   Communication: No difficulties  Cognition Arousal/Alertness: Awake/alert Behavior During Therapy: WFL for tasks assessed/performed Overall Cognitive Status: Impaired/Different from baseline Area of Impairment: Orientation, Problem solving, Following commands                 Orientation Level: Disoriented to, Time     Following Commands: Follows one step commands with increased time, Follows one step commands consistently     Problem Solving: Slow processing General Comments: stated Nov 01, 2001        General Comments General comments (skin integrity, edema, etc.): spouse and son in the room and supportive; report pt still supervises cooking in the kitchen and spouse does the cooking.  States HH just concluded last Wednesday    Exercises     Assessment/Plan    PT Assessment Patient needs continued PT services  PT Problem List Decreased strength;Decreased activity tolerance;Decreased balance;Decreased mobility;Decreased knowledge of use of DME       PT Treatment Interventions DME instruction;Balance training;Gait training;Functional mobility training;Patient/family education;Therapeutic activities;Therapeutic exercise    PT Goals (Current  goals can be found in the Care Plan section)  Acute Rehab PT Goals Patient Stated Goal: to get back home PT Goal Formulation: With patient/family Time For Goal Achievement: 11/13/21 Potential to Achieve Goals: Good    Frequency Min 3X/week     Co-evaluation               AM-PAC PT "6 Clicks" Mobility  Outcome Measure Help needed turning from your back to your side while in a flat bed without using bedrails?: A Little Help needed moving from lying on your back to sitting on the side of a flat bed without using bedrails?: A Lot Help needed moving to and from a bed to a chair (including a wheelchair)?: A Little Help needed standing up from a chair using your arms (e.g., wheelchair or bedside chair)?: A Lot Help needed to walk in hospital room?: Total Help needed climbing 3-5 steps with a railing? : Total 6 Click Score: 12    End of Session Equipment Utilized During Treatment: Gait belt Activity Tolerance: Patient limited by fatigue Patient left: in bed;with call bell/phone within reach;with family/visitor present   PT Visit Diagnosis: Other abnormalities of gait and mobility (R26.89);Muscle weakness (generalized) (M62.81)    Time: 7893-8101 PT Time Calculation (min) (ACUTE ONLY): 22 min   Charges:   PT Evaluation $  PT Eval Moderate Complexity: 1 Mod          Cyndi Demira Gwynne, PT Acute Rehabilitation Services Office:[phone] 10/30/2021   Reginia Naas 10/30/2021, 5:08 PM

## 2021-10-30 NOTE — Progress Notes (Signed)
eLink Physician-Brief Progress Note Patient Name: Kristine [name] DOB: 1945/10/13 MRN: [id number]   Date of Service  10/30/2021  HPI/Events of Note  Patient requests Tylenol for HA. AST and ALT both normal.   eICU Interventions  Plan: Tylenol 650 mg PO Q 6 hours PRN HA.     Intervention Category Major Interventions: Other:  Markeeta Scalf Cornelia Copa 10/30/2021, 1:14 AM

## 2021-10-30 NOTE — Progress Notes (Addendum)
Progress Note  Patient Name: Kristine [name] Date of Encounter: 10/30/2021  [facility] Cardiologist: Glori Bickers, MD   Subjective   Patient feels better this AM. Denies sob, chest pain. Ankle edema is present, but improving. Patient reports good urine output, although the pure wick has not been working so it was not recorded. Weight down from 301.37 lbs yesterday to 298.06 lbs today   Inpatient Medications    Scheduled Meds:  Chlorhexidine Gluconate Cloth  6 each Topical Daily   furosemide  80 mg Oral BID   Continuous Infusions:   ceFAZolin (ANCEF) IV 2 g (10/30/21 0924)   diltiazem (CARDIZEM) infusion 7.5 mg/hr (10/30/21 0128)   heparin 1,500 Units/hr (10/30/21 0533)   PRN Meds: acetaminophen, diphenhydrAMINE, docusate sodium, levalbuterol, mineral oil-hydrophilic petrolatum, mouth rinse, polyethylene glycol   Vital Signs    Vitals:   10/30/21 0554 10/30/21 0600 10/30/21 0700 10/30/21 0806  BP:  111/82 128/73 117/68  Pulse:  82 79 78  Resp:  '18 17 15  '$ Temp:    (!) 97.3 F (36.3 C)  TempSrc:    Oral  SpO2:  94% 93% 95%  Weight: 135.2 kg     Height:        Intake/Output Summary (Last 24 hours) at 10/30/2021 0948 Last data filed at 10/30/2021 0300 Gross per 24 hour  Intake 417.63 ml  Output 400 ml  Net 17.63 ml      10/30/2021    5:54 AM 10/29/2021    1:11 AM 10/28/2021    7:11 PM  Last 3 Weights  Weight (lbs) 298 lb 1 oz 301 lb 5.9 oz 292 lb  Weight (kg) 135.2 kg 136.7 kg 132.45 kg      Telemetry    Atrial fibrillation, hr in the 70s-80s - Personally Reviewed  ECG    Atrial fibrillation, HR 121 bpm, low voltage QRS, RBBB - Personally Reviewed  Physical Exam   GEN: No acute distress. Sitting comfortably on the side of the bed Neck: No JVD Cardiac: Irregular rate and rhythm, no murmurs. Radial pulses 2+ bilaterally  Respiratory: Crackles in bilateral lung bases. Normal WOB on room air  GI: Soft, nontender, non-distended  MS: 1+ pitting  edema in BLE Neuro:  Nonfocal  Psych: Normal affect   Labs    High Sensitivity Troponin:   Recent Labs  Lab 10/28/21 1945 10/28/21 2139  TROPONINIHS 53* 84*     Chemistry Recent Labs  Lab 10/28/21 1920 10/28/21 1923 10/28/21 1944 10/29/21 0439 10/29/21 1009 10/29/21 1925 10/30/21 0058  NA 134*   < >  --  134* 134* 132* 132*  K 2.7*   < >  --  3.0* 3.1* 4.1 4.2  CL 91*  --   --  93* 95* 93* 93*  CO2 28  --   --  '27 26 22 26  '$ GLUCOSE 117*  --   --  82 94 145* 200*  BUN 89*  --   --  85* 86* 88* 90*  CREATININE 2.86*  --   --  2.87* 2.91* 2.80* 2.98*  CALCIUM 9.6  --   --  8.8* 8.9 9.0 9.2  MG  --   --  2.4 2.3  --   --   --   PROT 7.8  --   --  5.7*  --   --   --   ALBUMIN 4.0  --   --  2.9*  --   --   --  AST 21  --   --  16  --   --   --   ALT 9  --   --  9  --   --   --   ALKPHOS 110  --   --  76  --   --   --   BILITOT 2.2*  --   --  2.1*  --   --   --   GFRNONAA 17*  --   --  17* 16* 17* 16*  ANIONGAP 15  --   --  14 13 17* 13   < > = values in this interval not displayed.    Lipids No results for input(s): "CHOL", "TRIG", "HDL", "LABVLDL", "LDLCALC", "CHOLHDL" in the last 168 hours.  Hematology Recent Labs  Lab 10/29/21 0201 10/29/21 0439 10/30/21 0058  WBC 40.6* 43.8* 24.3*  RBC 3.24* 3.41* 3.70*  HGB 9.3* 10.0* 10.7*  HCT 29.0* 30.0* 32.4*  MCV 89.5 88.0 87.6  MCH 28.7 29.3 28.9  MCHC 32.1 33.3 33.0  RDW 17.2* 16.8* 16.6*  PLT 138* 142* 141*   Thyroid  Recent Labs  Lab 10/29/21 1009  TSH 2.149    BNP Recent Labs  Lab 10/28/21 1945  BNP 276.7*    DDimer No results for input(s): "DDIMER" in the last 168 hours.   Radiology    DG Chest Port 1 View  Result Date: 10/29/2021 CLINICAL DATA:  Septic shock, altered mental status EXAM: PORTABLE CHEST 1 VIEW COMPARISON:  09/17/2021, 10/28/2021 FINDINGS: Stable cardiomegaly. Pulmonary vascular congestion. Mild diffuse interstitial prominence. Streaky bibasilar opacities. No large pleural fluid  collection. No pneumothorax. IMPRESSION: Cardiomegaly with pulmonary vascular congestion and mild interstitial edema. Streaky bibasilar opacities likely representing atelectasis. Electronically Signed   By: Davina Poke D.O.   On: 10/29/2021 08:53   DG Knee Left Port  Result Date: 10/28/2021 CLINICAL DATA:  Knee wound EXAM: PORTABLE LEFT KNEE - 1-2 VIEW COMPARISON:  None Available. FINDINGS: No evidence of fracture, dislocation, or joint effusion. No evidence of arthropathy or other focal bone abnormality. Soft tissues are unremarkable. IMPRESSION: Negative. Electronically Signed   By: Rolm Baptise M.D.   On: 10/28/2021 23:13   CT CHEST ABDOMEN PELVIS WO CONTRAST  Result Date: 10/28/2021 CLINICAL DATA:  Sepsis, fever EXAM: CT CHEST, ABDOMEN AND PELVIS WITHOUT CONTRAST TECHNIQUE: Multidetector CT imaging of the chest, abdomen and pelvis was performed following the standard protocol without IV contrast. RADIATION DOSE REDUCTION: This exam was performed according to the departmental dose-optimization program which includes automated exposure control, adjustment of the mA and/or kV according to patient size and/or use of iterative reconstruction technique. COMPARISON:  02/19/2021 FINDINGS: CT CHEST FINDINGS Cardiovascular: Coronary artery and aortic calcifications. Heart is mildly enlarged. Aorta normal caliber. Mediastinum/Nodes: Mildly prominent mediastinal lymph nodes, stable since prior study, likely reactive. No axillary adenopathy. Lungs/Pleura: No confluent opacities or effusions. Musculoskeletal: Chest wall soft tissues are unremarkable. No acute bony abnormality. CT ABDOMEN PELVIS FINDINGS Hepatobiliary: Nodular contours of the liver suggest cirrhosis. No focal hepatic abnormality. Prior cholecystectomy. Pancreas: No focal abnormality or ductal dilatation. Spleen: Spleen his upper limits normal in size at 12.5 cm. No focal abnormality. Adrenals/Urinary Tract: No adrenal abnormality. No focal renal  abnormality. No stones or hydronephrosis. Urinary bladder is unremarkable. Stomach/Bowel: Sigmoid diverticulosis. No active diverticulitis. Stomach and small bowel decompressed, unremarkable. Vascular/Lymphatic: Aortic atherosclerosis. No evidence of aneurysm or adenopathy. Reproductive: Prior hysterectomy.  No adnexal masses. Other: No free fluid or free air. Edema throughout the  abdominal wall subcutaneous soft tissues. Musculoskeletal: No acute bony abnormality. IMPRESSION: Cardiomegaly, coronary artery disease. Aortic atherosclerosis. No acute cardiopulmonary disease. Changes of cirrhosis. Sigmoid diverticulosis. Anasarca like edema throughout the abdominal wall. Electronically Signed   By: Rolm Baptise M.D.   On: 10/28/2021 20:37   DG Foot Complete Right  Result Date: 10/28/2021 CLINICAL DATA:  t brought in by family with complaints of fever and altered mental status. Incontinent of urine while staff attempting to get out of vehicle Family report cardiac procedure nuclear study toda EXAM: RIGHT FOOT COMPLETE - 3+ VIEW COMPARISON:  None Available. FINDINGS: There is no evidence of fracture or dislocation. Bite-like erosion of the base of the first digit fall fulfilling the overhanging edge. Degenerative changes of the distal interphalangeal joints. Plantar calcaneal spur. Cortical irregularity along the base of the fifth metatarsal likely old healed fracture. Soft tissues are unremarkable. IMPRESSION: Query gouty changes of the base of the proximal phalanx of the first digit. No definite findings of acute osteomyelitis. If concern, consider MRI for further evaluation. Electronically Signed   By: Iven Finn M.D.   On: 10/28/2021 20:30   CT Head Wo Contrast  Result Date: 10/28/2021 CLINICAL DATA:  Altered mental status EXAM: CT HEAD WITHOUT CONTRAST TECHNIQUE: Contiguous axial images were obtained from the base of the skull through the vertex without intravenous contrast. RADIATION DOSE REDUCTION: This  exam was performed according to the departmental dose-optimization program which includes automated exposure control, adjustment of the mA and/or kV according to patient size and/or use of iterative reconstruction technique. COMPARISON:  05/06/2012 FINDINGS: Brain: There is atrophy and chronic small vessel disease changes. No acute intracranial abnormality. Specifically, no hemorrhage, hydrocephalus, mass lesion, acute infarction, or significant intracranial injury. Vascular: No hyperdense vessel or unexpected calcification. Skull: No acute calvarial abnormality. Sinuses/Orbits: No acute findings Other: None IMPRESSION: Atrophy, chronic microvascular disease. No acute intracranial abnormality. Electronically Signed   By: Rolm Baptise M.D.   On: 10/28/2021 20:29   DG Foot Complete Left  Result Date: 10/28/2021 CLINICAL DATA:  Sore foot, fever EXAM: LEFT FOOT - COMPLETE 3+ VIEW COMPARISON:  None Available. FINDINGS: Postoperative changes in the midfoot and hindfoot from prior fusion. No acute bony abnormality. Specifically, no fracture, subluxation, or dislocation. No bone destruction to suggest osteomyelitis. Possible soft tissue ulcer at the base of the great toe. IMPRESSION: No acute bony abnormality. Electronically Signed   By: Rolm Baptise M.D.   On: 10/28/2021 20:28   NM CARDIAC AMYLOID TUMOR LOC INFLAM SPECT 1 DAY  Result Date: 10/28/2021 CLINICAL DATA:  HEART FAILURE. CONCERN FOR CARDIAC AMYLOIDOSIS. EXAM: NUCLEAR MEDICINE TUMOR LOCALIZATION. PYP CARDIAC AMYLOIDOSIS SCAN WITH SPECT TECHNIQUE: Following intravenous administration of radiopharmaceutical, anterior planar images of the chest were obtained. Regions of interest were placed on the heart and contralateral chest wall for quantitative assessment. Additional SPECT imaging of the chest could not be obtained due to thoracic deformity, body habitus and patient inability to lie flat or still due to back pain. RADIOPHARMACEUTICALS:  21.7 mCi TC- 38m PYROPHOSPHATE IV FINDINGS: Planar Visual assessment: Anterior planar imaging demonstrates radiotracer uptake within the heart less than uptake within the adjacent ribs (Grade 1). Quantitative assessment : Quantitative assessment of the cardiac uptake compared to the contralateral chest wall is equal to 1.05 (H/CL = 1.05). SPECT assessment: Unable to be performed, as above IMPRESSION: Visual and quantitative assessment (grade 1, H/CL = 1.05) is equivocally suggestive of transthyretin amyloidosis. Electronically Signed   By: MLavonia Dana  M.D.   On: 10/28/2021 13:35    Cardiac Studies   Echocardiogram 09/12/21 1. Left ventricular ejection fraction, by estimation, is 55 to 60%. The  left ventricle has normal function. The left ventricle has no regional  wall motion abnormalities. There is mild left ventricular hypertrophy.  Left ventricular diastolic function  could not be evaluated. There is the interventricular septum is flattened  in diastole ('D' shaped left ventricle), consistent with right ventricular  volume overload.   2. Right ventricular systolic function is severely reduced. The right  ventricular size is severely enlarged. There is severely elevated  pulmonary artery systolic pressure.   3. The mitral valve is normal in structure. No evidence of mitral valve  regurgitation.   4. Tricuspid valve regurgitation is mild to moderate.   5. The aortic valve is calcified. Aortic valve regurgitation is not  visualized. Mild aortic valve stenosis.   6. The inferior vena cava is dilated in size with <50% respiratory  variability, suggesting right atrial pressure of 15 mmHg.   Patient Profile     76 y.o. female with a hx of persistent atrial fibrillation, CHF, CKD, DM, hypertension, hypothyroidism, hyperlipidemia, sleep apnea who is being seen for the evaluation of CHF   Assessment & Plan    Chronic right sided heart failure  Chronic HFpEF  Pulmonary HTN  Transthyretin Amyloidosis  - Echo  from 08/08/20 with EF 45-50%, severely reduced RV function, RVSP 55, D-shaped septum, severe biatrial enlargement  - Echo from 09/12/21 with EF 55-60%, severely reduced RV systolic function, RVSP 60, D-shaped septum  - Per review of advanced heart failure notes, given patient's history of atrial fibrillation, low voltage QRS complexes, CKD, and polyneuropathy there was concern for possible amyloid - Underwent PYP scan on 8/8 that suggested transthyretin amyloidosis, however it is unlikely amyloidosis is the primary driver of her heart failure  - Now, patient is admitted with sepsis. Receiving IV abx  - Yesterday we started PO lasix 80 mg BID(note- was on lasix 120 mg BID and metolazone three times weekly. We started her back on a decreased dose due to low BP) - I/Os incomplete but patient and daughter report good urine output. BP is improving. Increase lasix to 120 mg BID  - Not on ACEi/ARB/ARNI or MRA due to poor renal function - Start metoprolol 12.5 mg bid, transition to long acting prior to dc    Chronic Atrial Fibrillation - Metoprolol held on admission due to low BP - Patient did get tachycardic overnight-- was started on IV diltiazem  - HR now improved to the 70s-80s  - Stop IV diltiazem given reduced RV function. Start metoprolol 12.5 mg BID  - PTA patient was taking xarelto 15 mg daily, developed oral bleeding. Was later switched to eliqius 2.5 mg BID, also developed oral bleeding.  - CHADS-VASc 5, stared on IV heparin and patient denies any bleeding. Will discuss with MD if patient should try coumadin vs pradaxa, or hold off on AC due to    Otherwise per primary  - Sepsis with unclear source  - Acute encephalopathy  - Longstanding ulcerations of BLE - L knee wound  - Type 2 DM- peripheral neuropathy  - COPD - GERD  -Gout - Hypothyroidism      For questions or updates, please contact [facility] Please consult [email or web address] for contact info under        Signed, Margie Billet, PA-C  10/30/2021, 9:48 AM    Patient seen and examined  and agree with Vikki Ports, PA-C as detailed above.    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 Cardiology is consulted for ongoing management of HFpEF and RV failure.   Patient has known history of mainly right sided heart failure. Was hospitalized in 08/2021 for worsening SOB and volume overload in the setting of recently decreasing her diuretics. TTE that admission revealed EF 55-60%, no regional wall motion abnormalities, mild LVH, severely reduced RV systolic function, severely elevated pulmonary artery systolic pressure. She was diuresed with IV lasix and metolazone and discharged on lasix '120mg'$  BID and metolazone 2.'5mg'$  three times weekly.   She was seen in follow-up by HF clinic and was doing okay. Underwent PYP scan yesterday which was equivocal but grossly not likely to be significant amyloidosis causing her heart failure. Following the PYP scan, she developed fevers, AMS and rigors prompting her to come to the ER.   Patient initially in ICU for sepsis. Blood cultures positive for group B strep with unknown source. She was placed on broad spectrum ABX and given IVF. Required pressors initially but she has since been weaned off and has now been transferred to the floor. Cardiology was consulted for ongoing management of right sided heart failure.  Given improvement in blood pressure, she has been resumed on her home lasix '120mg'$  PO BID. Wt inaccurate as was bed weight. Dry weight around 274-277lbs. She does not appear grossly overloaded on exam but volume status difficult due to body habitus. Will check standing weight and manage diuretics  accordingly.      GEN: Laying in bed, comfortable and alert  Neck: JVD difficult to assess due to body habitus Cardiac: Irregular, no murmurs Respiratory: Clear bilaterally GI: Soft, nontender, non-distended  MS: Trace-1+ ankle edema, warm Neuro:  Nonfocal  Psych: Normal affect     Plan: -Continue lasix '120mg'$  PO BID for now; will adjust pending weights; dry weight is 274-277lbs -Continue heparin gtt for now; hopefully can re-challenge with apixaban for long-term AC -Management of sepsis per primary team -Resumed metop 12.'5mg'$  BID -Would try to avoid IV dilt given significant RV dysfunction   Gwyndolyn Kaufman, MD

## 2021-10-31 ENCOUNTER — Inpatient Hospital Stay (HOSPITAL_COMMUNITY): Payer: Medicare HMO

## 2021-10-31 ENCOUNTER — Ambulatory Visit: Payer: Medicare HMO | Admitting: Physician Assistant

## 2021-10-31 DIAGNOSIS — A419 Sepsis, unspecified organism: Secondary | ICD-10-CM | POA: Diagnosis not present

## 2021-10-31 DIAGNOSIS — G9340 Encephalopathy, unspecified: Secondary | ICD-10-CM | POA: Diagnosis not present

## 2021-10-31 DIAGNOSIS — R6521 Severe sepsis with septic shock: Secondary | ICD-10-CM | POA: Diagnosis not present

## 2021-10-31 DIAGNOSIS — I4891 Unspecified atrial fibrillation: Secondary | ICD-10-CM | POA: Diagnosis not present

## 2021-10-31 DIAGNOSIS — R600 Localized edema: Secondary | ICD-10-CM | POA: Diagnosis not present

## 2021-10-31 DIAGNOSIS — B951 Streptococcus, group B, as the cause of diseases classified elsewhere: Secondary | ICD-10-CM | POA: Diagnosis not present

## 2021-10-31 DIAGNOSIS — R7881 Bacteremia: Secondary | ICD-10-CM | POA: Diagnosis not present

## 2021-10-31 HISTORY — PX: IR US GUIDE BX ASP/DRAIN: IMG2392

## 2021-10-31 LAB — GLUCOSE, CAPILLARY
Glucose-Capillary: 248 mg/dL — ABNORMAL HIGH (ref 70–99)
Glucose-Capillary: 249 mg/dL — ABNORMAL HIGH (ref 70–99)
Glucose-Capillary: 133 mg/dL — ABNORMAL HIGH (ref 70–99)
Glucose-Capillary: 142 mg/dL — ABNORMAL HIGH (ref 70–99)

## 2021-10-31 LAB — CULTURE, BLOOD (ROUTINE X 2): Special Requests: ADEQUATE

## 2021-10-31 LAB — CBC
HCT: 32.7 % — ABNORMAL LOW (ref 36.0–46.0)
Hemoglobin: 10.7 g/dL — ABNORMAL LOW (ref 12.0–15.0)
MCH: 28.6 pg (ref 26.0–34.0)
MCHC: 32.7 g/dL (ref 30.0–36.0)
MCV: 87.4 fL (ref 80.0–100.0)
Platelets: 142 10*3/uL — ABNORMAL LOW (ref 150–400)
RBC: 3.74 MIL/uL — ABNORMAL LOW (ref 3.87–5.11)
RDW: 16.6 % — ABNORMAL HIGH (ref 11.5–15.5)
WBC: 15.8 10*3/uL — ABNORMAL HIGH (ref 4.0–10.5)
nRBC: 0 % (ref 0.0–0.2)

## 2021-10-31 LAB — BASIC METABOLIC PANEL
Anion gap: 14 (ref 5–15)
BUN: 101 mg/dL — ABNORMAL HIGH (ref 8–23)
CO2: 23 mmol/L (ref 22–32)
Calcium: 9.1 mg/dL (ref 8.9–10.3)
Chloride: 93 mmol/L — ABNORMAL LOW (ref 98–111)
Creatinine, Ser: 2.82 mg/dL — ABNORMAL HIGH (ref 0.44–1.00)
GFR, Estimated: 17 mL/min — ABNORMAL LOW (ref >60)
Glucose, Bld: 240 mg/dL — ABNORMAL HIGH (ref 70–99)
Potassium: 3.4 mmol/L — ABNORMAL LOW (ref 3.5–5.1)
Sodium: 130 mmol/L — ABNORMAL LOW (ref 135–145)

## 2021-10-31 LAB — HEPARIN LEVEL (UNFRACTIONATED): Heparin Unfractionated: 0.31 IU/mL (ref 0.30–0.70)

## 2021-10-31 LAB — PROCALCITONIN: Procalcitonin: 22.03 ng/mL

## 2021-10-31 MED ORDER — LIDOCAINE HCL 1 % IJ SOLN
INTRAMUSCULAR | Status: AC
  Filled 2021-10-31: qty 20

## 2021-10-31 MED ORDER — MORPHINE SULFATE (PF) 2 MG/ML IV SOLN
2.0000 mg | Freq: Once | INTRAVENOUS | Status: AC | PRN
  Administered 2021-10-31: 2 mg via INTRAVENOUS
  Filled 2021-10-31: qty 1

## 2021-10-31 MED ORDER — DEXTROSE 5 % IV SOLN
100.0000 mg | Freq: Two times a day (BID) | INTRAVENOUS | Status: DC
  Administered 2021-11-01 – 2021-11-02 (×3): 100 mg via INTRAVENOUS
  Filled 2021-10-31 (×5): qty 10

## 2021-10-31 NOTE — Progress Notes (Signed)
Progress Note  Patient Name: Kristine [name] Date of Encounter: 11/01/2021  [facility] Cardiologist: Glori Bickers, MD   Subjective   More lethargic today after receiving morphine for LE wounds. Denies chest pain or SOB  Placed on lasix '100mg'$  IV BID yesterday due to increase in wt Cr improved to 2.66 this AM I/Os not recorded; wt not recorded  Inpatient Medications    Scheduled Meds:  allopurinol  300 mg Oral q AM   DULoxetine  30 mg Oral Daily   insulin aspart  0-20 Units Subcutaneous TID WC   insulin aspart  0-5 Units Subcutaneous QHS   insulin glargine-yfgn  25 Units Subcutaneous Daily   levothyroxine  88 mcg Oral q morning   metoprolol tartrate  12.5 mg Oral BID   pregabalin  50 mg Oral BID   Vitamin D (Ergocalciferol)  50,000 Units Oral Q7 days   Continuous Infusions:   ceFAZolin (ANCEF) IV 2 g (11/01/21 0901)   furosemide 100 mg (11/01/21 0905)   heparin 1,500 Units/hr (11/01/21 1055)   PRN Meds: acetaminophen, ALPRAZolam, diphenhydrAMINE, docusate sodium, levalbuterol, mineral oil-hydrophilic petrolatum, mouth rinse, polyethylene glycol, traMADol   Vital Signs    Vitals:   10/31/21 2019 11/01/21 0000 11/01/21 0334 11/01/21 0825  BP: 113/87 113/82 123/65 115/65  Pulse: 82 88 95 95  Resp: '14 13 11 20  '$ Temp: (!) 95.4 F (35.2 C) 97.6 F (36.4 C) 97.9 F (36.6 C) 97.8 F (36.6 C)  TempSrc: Rectal Oral Oral Oral  SpO2: 100% 97% 97% 96%  Weight:      Height:        Intake/Output Summary (Last 24 hours) at 11/01/2021 1213 Last data filed at 11/01/2021 0830 Gross per 24 hour  Intake 404.95 ml  Output --  Net 404.95 ml      10/31/2021    3:38 AM 10/30/2021    2:46 PM 10/30/2021    5:54 AM  Last 3 Weights  Weight (lbs) 302 lb 4 oz 301 lb 9.6 oz 298 lb 1 oz  Weight (kg) 137.1 kg 136.805 kg 135.2 kg      Telemetry    Afib, rate controlled. Rare NSVT of 3 beats - Personally Reviewed  ECG    No new tracing - Personally  Reviewed  Physical Exam   GEN: Sitting up, NAD Neck: No JVD Cardiac: Irregular Respiratory: CTAB  GI: Soft, nontender, non-distended  MS: 1+ LE edema, warm Neuro:  Answering questions appropriately but more somnolent than yesterday  Psych: Normal affect   Labs    High Sensitivity Troponin:   Recent Labs  Lab 10/28/21 1945 10/28/21 2139  TROPONINIHS 53* 84*     Chemistry Recent Labs  Lab 10/28/21 1920 10/28/21 1923 10/28/21 1944 10/29/21 0439 10/29/21 1009 10/30/21 0058 10/31/21 0325 11/01/21 0313  NA 134*   < >  --  134*   < > 132* 130* 134*  K 2.7*   < >  --  3.0*   < > 4.2 3.4* 3.4*  CL 91*  --   --  93*   < > 93* 93* 95*  CO2 28  --   --  27   < > '26 23 25  '$ GLUCOSE 117*  --   --  82   < > 200* 240* 115*  BUN 89*  --   --  85*   < > 90* 101* 102*  CREATININE 2.86*  --   --  2.87*   < > 2.98*  2.82* 2.66*  CALCIUM 9.6  --   --  8.8*   < > 9.2 9.1 9.4  MG  --   --  2.4 2.3  --   --   --   --   PROT 7.8  --   --  5.7*  --   --   --   --   ALBUMIN 4.0  --   --  2.9*  --   --   --   --   AST 21  --   --  16  --   --   --   --   ALT 9  --   --  9  --   --   --   --   ALKPHOS 110  --   --  76  --   --   --   --   BILITOT 2.2*  --   --  2.1*  --   --   --   --   GFRNONAA 17*  --   --  17*   < > 16* 17* 18*  ANIONGAP 15  --   --  14   < > '13 14 14   '$ < > = values in this interval not displayed.    Lipids No results for input(s): "CHOL", "TRIG", "HDL", "LABVLDL", "LDLCALC", "CHOLHDL" in the last 168 hours.  Hematology Recent Labs  Lab 10/30/21 0058 10/31/21 0325 11/01/21 0313  WBC 24.3* 15.8* 11.8*  RBC 3.70* 3.74* 3.50*  HGB 10.7* 10.7* 10.1*  HCT 32.4* 32.7* 30.9*  MCV 87.6 87.4 88.3  MCH 28.9 28.6 28.9  MCHC 33.0 32.7 32.7  RDW 16.6* 16.6* 16.7*  PLT 141* 142* 218   Thyroid  Recent Labs  Lab 10/29/21 1009  TSH 2.149    BNP Recent Labs  Lab 10/28/21 1945  BNP 276.7*    DDimer No results for input(s): "DDIMER" in the last 168 hours.    Radiology    IR US Guide Bx Asp/Drain  Result Date: 11/01/2021 INDICATION: Bacteremia, small fluid collection in the lateral aspect of the knee EXAM: Ultrasound aspiration MEDICATIONS: The patient is currently admitted to the hospital and receiving intravenous antibiotics. The antibiotics were administered within an appropriate time frame prior to the initiation of the procedure. ANESTHESIA/SEDATION: None COMPLICATIONS: None immediate. PROCEDURE: Informed written consent was obtained from the patient after a thorough discussion of the procedural risks, benefits and alternatives. All questions were addressed. Maximal Sterile Barrier Technique was utilized including caps, mask, sterile gowns, sterile gloves, sterile drape, hand hygiene and skin antiseptic. A timeout was performed prior to the initiation of the procedure. The lateral aspect of the left knee was interrogated with ultrasound. A small complex fluid collection is identified in the superficial soft tissues. The overlying skin was sterilely prepped and draped in the standard fashion using chlorhexidine skin prep. Local anesthesia was attained by infiltration with 1% lidocaine. An 18 gauge trocar needle was carefully advanced into the fluid collection under continuous ultrasound guidance. Aspiration was then performed yielding 1.5 mL thick purulent fluid. This was sent for Gram stain and culture. IMPRESSION: Successful aspiration of small abscess in the superficial soft tissues at the lateral aspect of the knee yielding 1.5 mL thick purulent fluid. This was sent for Gram stain and culture. Electronically Signed   By: Jacqulynn Cadet M.D.   On: 11/01/2021 07:07   MR FOOT RIGHT WO CONTRAST  Result Date: 10/31/2021 CLINICAL DATA:  Foot swelling, diabetic, osteomyelitis  suspected, xray done EXAM: MRI OF THE RIGHT FOREFOOT WITHOUT CONTRAST TECHNIQUE: Multiplanar, multisequence MR imaging of the right forefoot was performed. No intravenous contrast  was administered. COMPARISON:  X-ray 10/28/2021 FINDINGS: Technical note: Mildly motion degraded images. Bones/Joint/Cartilage No acute fracture. No dislocation. Moderate degenerative changes of the foot, most pronounced at the first MTP joint and within the midfoot. No focal erosion or site of marrow replacement. Ligaments Intact Lisfranc ligament. Collateral ligaments of the forefoot appear intact. Muscles and Tendons Chronic denervation changes of the foot musculature. No tenosynovial fluid collection. Soft tissues Mild soft tissue edema.  No organized fluid collection. IMPRESSION: 1. Mildly motion degraded exam. 2. No acute osseous abnormality of the right forefoot. 3. Mild soft tissue edema. No organized fluid collection. Electronically Signed   By: Davina Poke D.O.   On: 10/31/2021 18:56   MR FOOT LEFT WO CONTRAST  Result Date: 10/31/2021 CLINICAL DATA:  Foot swelling, diabetic, osteomyelitis suspected, xray done EXAM: MRI OF THE LEFT FOOT WITHOUT CONTRAST TECHNIQUE: Multiplanar, multisequence MR imaging of the left forefoot was performed. No intravenous contrast was administered. COMPARISON:  X-ray 10/28/2021 FINDINGS: Technical note: Severely motion limited images. There is poor fat saturation on T2 weighted images related to patient's hardware. Best possible images were obtained. Bones/Joint/Cartilage Susceptibility artifact from midfoot and first TMT effusions. No evidence of acute fracture or dislocation. No obvious sites of bone destruction or marrow replacement. Degenerative changes within the forefoot, better depicted radiographically. No large joint effusions are evident. Ligaments Suboptimally assessed. Muscles and Tendons Fatty atrophy of the foot musculature. No obvious tenosynovial fluid collection. Soft tissues Mild subcutaneous edema over the dorsum of the foot. No obvious fluid collections. IMPRESSION: 1. Severely limited exam. 2. No obvious acute osseous abnormality of the left  forefoot. 3. Mild subcutaneous edema over the dorsum of the foot. No obvious fluid collections. Electronically Signed   By: Davina Poke D.O.   On: 10/31/2021 18:54    Cardiac Studies   Echocardiogram 09/12/21 1. Left ventricular ejection fraction, by estimation, is 55 to 60%. The  left ventricle has normal function. The left ventricle has no regional  wall motion abnormalities. There is mild left ventricular hypertrophy.  Left ventricular diastolic function  could not be evaluated. There is the interventricular septum is flattened  in diastole ('D' shaped left ventricle), consistent with right ventricular  volume overload.   2. Right ventricular systolic function is severely reduced. The right  ventricular size is severely enlarged. There is severely elevated  pulmonary artery systolic pressure.   3. The mitral valve is normal in structure. No evidence of mitral valve  regurgitation.   4. Tricuspid valve regurgitation is mild to moderate.   5. The aortic valve is calcified. Aortic valve regurgitation is not  visualized. Mild aortic valve stenosis.   6. The inferior vena cava is dilated in size with <50% respiratory  variability, suggesting right atrial pressure of 15 mmHg.   Patient Profile     76 y.o. female with a hx of persistent atrial fibrillation, CHF, CKD, DM, hypertension, hypothyroidism, hyperlipidemia, sleep apnea who is being seen for the evaluation of CHF   Assessment & Plan    Acute on Chronic right sided heart failure  Chronic HFpEF  Pulmonary HTN  - Echo from 08/08/20 with EF 45-50%, severely reduced RV function, RVSP 55, D-shaped septum, severe biatrial enlargement  - Echo from 09/12/21 with EF 55-60%, severely reduced RV systolic function, RVSP 60, D-shaped septum  - Per review of  advanced heart failure notes, given patient's history of atrial fibrillation, low voltage QRS complexes, CKD, and polyneuropathy there was concern for possible amyloid - Underwent PYP  scan on 8/8 with equivocal study per my review; unlikely amyloid is the primary driver of heart failure - Now, patient is admitted with sepsis. Receiving IV abx  - Wt has been increasing (dry weight 274-277lbs based on last discharge). Lasix initially held due to septic shock. Now resumed given improvement - Continue lasix '100mg'$  IV BID - Dose metolazone 2.'5mg'$  today - Worry about long term prognosis given right heart failure, frequent hospitalizations and medical comorbidities. Recommend palliative care consultation. - Needs daily standing weights to help gauge response to IV diuresis - Not on ACEi/ARB/ARNI or MRA due to poor renal function  Chronic Atrial Fibrillation - Continue metop 12.'5mg'$  BID - Avoid dilt due to severely reduced RV systolic function - PTA patient was taking xarelto 15 mg daily, developed oral bleeding. Was later switched to eliqius 2.5 mg BID, also developed oral bleeding.  - CHADS-VASc 5, stared on IV heparin and patient denies any bleeding. Hopefully can transition back to PO apixaban prior to discharge if no recurrence of bleeding  Strep Bacteremia Septic Shock -ID following -On cefazolin -Planned for TEE Monday; high risk for anesthesia given RV dysfunction and pulmonary HTN; discussed with Dr. Haroldine Laws and he will tentatively plan to perform TEE on Monday pending availability   Otherwise per primary  - Sepsis with unclear source  - Acute encephalopathy  - Longstanding ulcerations of BLE - L knee wound  - Type 2 DM- peripheral neuropathy  - COPD - GERD  -Gout - Hypothyroidism      For questions or updates, please contact [facility] Please consult [email or web address] for contact info under        Signed, Freada Bergeron, MD  11/01/2021, 12:13 PM

## 2021-10-31 NOTE — Progress Notes (Signed)
ANTICOAGULATION CONSULT NOTE - Follow Up Consult  Pharmacy Consult for Heparin Indication: atrial fibrillation  Allergies  Allergen Reactions   Albuterol Other (See Comments)    REACTION: Tachycardia- AFib   Epinephrine Other (See Comments)    Increases heart rate per patient.    Penicillins Other (See Comments)    REACTION: flushing' \\T'$ \ hot  Did it involve swelling of the face/tongue/throat, SOB, or low BP? No Did it involve sudden or severe rash/hives, skin peeling, or any reaction on the inside of your mouth or nose? No Did you need to seek medical attention at a hospital or doctor's office? No When did it last happen?    20 years ago   If all above answers are "NO", may proceed with cephalosporin use.   Ceftriaxone Other (See Comments)    Sweats   Citalopram Hydrobromide Other (See Comments)    Prolonged QT prolongation   Exenatide Other (See Comments)    Unknown reaction   Ketorolac Tromethamine Hives and Swelling    swelling in hands   Tizanidine Other (See Comments)    lethargic   Torsemide Swelling   Zoledronic Acid Other (See Comments)    Pt was hospitalized due to medication   Bee Venom Other (See Comments)    "makes me nervous"   Ivp Dye [Iodinated Contrast Media] Other (See Comments)    flushing   Keflex [Cephalexin] Other (See Comments)    Makes her feel warm; she was told never to take it.    Patient Measurements: Height: '5\' 9"'$  (175.3 cm) Weight: (!) 137.1 kg (302 lb 4 oz) IBW/kg (Calculated) : 66.2 Heparin Dosing Weight: 99 kg  Vital Signs: Temp: 96.8 F (36 C) (08/11 0338) Temp Source: Rectal (08/11 0338) BP: 126/79 (08/11 0900) Pulse Rate: 95 (08/11 0900)  Labs: Recent Labs    10/28/21 1920 10/28/21 1923 10/28/21 1944 10/28/21 1945 10/28/21 2139 10/29/21 0201 10/29/21 0439 10/29/21 1009 10/29/21 1925 10/30/21 0058 10/30/21 1511 10/31/21 0325  HGB 11.8*   < >  --   --   --    < > 10.0*  --   --  10.7*  --  10.7*  HCT 36.6   < >  --    --   --    < > 30.0*  --   --  32.4*  --  32.7*  PLT 192  --   --   --   --    < > 142*  --   --  141*  --  142*  APTT  --   --  36  --   --   --   --   --   --   --   --   --   LABPROT 14.8  --   --   --   --   --   --   --   --   --   --   --   INR 1.2  --   --   --   --   --   --   --   --   --   --   --   HEPARINUNFRC  --   --   --   --   --   --   --   --   --  0.30 0.42 0.31  CREATININE 2.86*  --   --   --   --   --  2.87*   < > 2.80* 2.98*  --  2.82*  TROPONINIHS  --   --   --  53* 84*  --   --   --   --   --   --   --    < > = values in this interval not displayed.    Estimated Creatinine Clearance: 26.1 mL/min (A) (by C-G formula based on SCr of 2.82 mg/dL (H)).  Assessment: 76 yo F presents with AMS/sepsis. Pt on Xarelto '15mg'$  in the past but changed to apixaban 2.'5mg'$  po BID back in July - stopped ~1 week PTA due to severe hemoptysis and knee hematoma/bleeding. Hgb down to 10, plt down to 142. Had only received SCDs for VTE prophylaxis.  Pharmacy consulted to start heparin for afib on 8/9 pm per cards recs. May re-challenge with DOAC if Hgb remains stable. Apixaban should not still be affecting heparin levels.   Heparin level remains low therapeutic (0.31) on 1500 units/hr.  Hgb 10.7, stable; no further drop in platelet count. No bleeding reported.  Goal of Therapy:  Heparin level 0.3-0.7 units/ml Monitor platelets by anticoagulation protocol: Yes   Plan:  Continue heparin drip at 1500 units/hr Daily heparin level and CBC. Monitor for signs/symptoms of bleeding. Follow up anticoagulation plans.  Arty Baumgartner, RPh 10/31/2021,9:42 AM

## 2021-10-31 NOTE — Progress Notes (Signed)
Progress Note  Patient Name: Kristine [name] Date of Encounter: 10/31/2021  [facility] Cardiologist: Glori Bickers, MD   Subjective   Patient feels tired today. No chest pain or SOB.   Na dropping to 130, Cr stable 2.82 I/Os not recorded; bed weight from today 302 lbs  Inpatient Medications    Scheduled Meds:  allopurinol  300 mg Oral q AM   DULoxetine  30 mg Oral Daily   insulin aspart  0-20 Units Subcutaneous TID WC   insulin aspart  0-5 Units Subcutaneous QHS   insulin glargine-yfgn  25 Units Subcutaneous Daily   levothyroxine  88 mcg Oral q morning   metoprolol tartrate  12.5 mg Oral BID   pregabalin  50 mg Oral BID   Vitamin D (Ergocalciferol)  50,000 Units Oral Q7 days   Continuous Infusions:   ceFAZolin (ANCEF) IV 2 g (10/31/21 0908)   furosemide     heparin 1,500 Units/hr (10/31/21 0900)   PRN Meds: acetaminophen, ALPRAZolam, diphenhydrAMINE, docusate sodium, levalbuterol, mineral oil-hydrophilic petrolatum, mouth rinse, polyethylene glycol, traMADol   Vital Signs    Vitals:   10/31/21 0338 10/31/21 0830 10/31/21 0900 10/31/21 1234  BP:  127/70 126/79 132/85  Pulse:  95 95 89  Resp: '17 17 17 15  '$ Temp: (S) (!) 96.8 F (36 C)     TempSrc: Rectal     SpO2:      Weight: (!) 137.1 kg     Height:        Intake/Output Summary (Last 24 hours) at 10/31/2021 1412 Last data filed at 10/31/2021 0900 Gross per 24 hour  Intake 596.1 ml  Output 650 ml  Net -53.9 ml       10/31/2021    3:38 AM 10/30/2021    2:46 PM 10/30/2021    5:54 AM  Last 3 Weights  Weight (lbs) 302 lb 4 oz 301 lb 9.6 oz 298 lb 1 oz  Weight (kg) 137.1 kg 136.805 kg 135.2 kg      Telemetry    Afib, rate controlled - Personally Reviewed  ECG    No new tracing - Personally Reviewed  Physical Exam   GEN: Laying flat in bed, NAD Neck: No JVD Cardiac: Irregular Respiratory: CTAB  GI: Soft, nontender, non-distended  MS: Trace-1+ LE edema, warm Neuro:  Nonfocal   Psych: Normal affect   Labs    High Sensitivity Troponin:   Recent Labs  Lab 10/28/21 1945 10/28/21 2139  TROPONINIHS 53* 84*      Chemistry Recent Labs  Lab 10/28/21 1920 10/28/21 1923 10/28/21 1944 10/29/21 0439 10/29/21 1009 10/29/21 1925 10/30/21 0058 10/31/21 0325  NA 134*   < >  --  134*   < > 132* 132* 130*  K 2.7*   < >  --  3.0*   < > 4.1 4.2 3.4*  CL 91*  --   --  93*   < > 93* 93* 93*  CO2 28  --   --  27   < > '22 26 23  '$ GLUCOSE 117*  --   --  82   < > 145* 200* 240*  BUN 89*  --   --  85*   < > 88* 90* 101*  CREATININE 2.86*  --   --  2.87*   < > 2.80* 2.98* 2.82*  CALCIUM 9.6  --   --  8.8*   < > 9.0 9.2 9.1  MG  --   --  2.4 2.3  --   --   --   --   PROT 7.8  --   --  5.7*  --   --   --   --   ALBUMIN 4.0  --   --  2.9*  --   --   --   --   AST 21  --   --  16  --   --   --   --   ALT 9  --   --  9  --   --   --   --   ALKPHOS 110  --   --  76  --   --   --   --   BILITOT 2.2*  --   --  2.1*  --   --   --   --   GFRNONAA 17*  --   --  17*   < > 17* 16* 17*  ANIONGAP 15  --   --  14   < > 17* 13 14   < > = values in this interval not displayed.     Lipids No results for input(s): "CHOL", "TRIG", "HDL", "LABVLDL", "LDLCALC", "CHOLHDL" in the last 168 hours.  Hematology Recent Labs  Lab 10/29/21 0439 10/30/21 0058 10/31/21 0325  WBC 43.8* 24.3* 15.8*  RBC 3.41* 3.70* 3.74*  HGB 10.0* 10.7* 10.7*  HCT 30.0* 32.4* 32.7*  MCV 88.0 87.6 87.4  MCH 29.3 28.9 28.6  MCHC 33.3 33.0 32.7  RDW 16.8* 16.6* 16.6*  PLT 142* 141* 142*    Thyroid  Recent Labs  Lab 10/29/21 1009  TSH 2.149     BNP Recent Labs  Lab 10/28/21 1945  BNP 276.7*     DDimer No results for input(s): "DDIMER" in the last 168 hours.   Radiology    MR KNEE LEFT WO CONTRAST  Result Date: 10/30/2021 CLINICAL DATA:  Right knee pain. EXAM: MRI OF THE LEFT KNEE WITHOUT CONTRAST TECHNIQUE: Multiplanar, multisequence MR imaging of the knee was performed. No intravenous  contrast was administered. COMPARISON:  Radiographs 10/28/2021 FINDINGS: Examination is extremely limited. There are 2 series other than the scout sequence all of which demonstrate significant motion artifact. There is a small to moderate-sized knee joint effusion. I do not see any grossly abnormal T2 signal intensity to suggest septic arthritis or osteomyelitis. There is diffuse subcutaneous soft tissue swelling/edema/fluid suggesting cellulitis there is a small fluid collection superficial to the lower lateral retinaculum which measures 19 mm and could be a small abscess. Suspect diffuse myofasciitis. IMPRESSION: 1. Very limited examination due to motion artifact. 2. Diffuse subcutaneous soft tissue swelling/edema/fluid suggesting cellulitis. Small fluid collection near the lateral retinaculum could be a small subcutaneous abscess. 3. Suspect mild myofasciitis without definite findings for pyomyositis. 4. Small to moderate-sized knee joint effusion. 5. No grossly abnormal T2 signal intensity to suggest septic arthritis or osteomyelitis. Electronically Signed   By: Marijo Sanes M.D.   On: 10/30/2021 14:02    Cardiac Studies   Echocardiogram 09/12/21 1. Left ventricular ejection fraction, by estimation, is 55 to 60%. The  left ventricle has normal function. The left ventricle has no regional  wall motion abnormalities. There is mild left ventricular hypertrophy.  Left ventricular diastolic function  could not be evaluated. There is the interventricular septum is flattened  in diastole ('D' shaped left ventricle), consistent with right ventricular  volume overload.   2. Right ventricular systolic function is severely reduced. The right  ventricular size is severely enlarged. There is severely elevated  pulmonary artery systolic pressure.   3. The mitral valve is normal in structure. No evidence of mitral valve  regurgitation.   4. Tricuspid valve regurgitation is mild to moderate.   5. The aortic  valve is calcified. Aortic valve regurgitation is not  visualized. Mild aortic valve stenosis.   6. The inferior vena cava is dilated in size with <50% respiratory  variability, suggesting right atrial pressure of 15 mmHg.   Patient Profile     76 y.o. female with a hx of persistent atrial fibrillation, CHF, CKD, DM, hypertension, hypothyroidism, hyperlipidemia, sleep apnea who is being seen for the evaluation of CHF   Assessment & Plan    Acute on Chronic right sided heart failure  Chronic HFpEF  Pulmonary HTN  - Echo from 08/08/20 with EF 45-50%, severely reduced RV function, RVSP 55, D-shaped septum, severe biatrial enlargement  - Echo from 09/12/21 with EF 55-60%, severely reduced RV systolic function, RVSP 60, D-shaped septum  - Per review of advanced heart failure notes, given patient's history of atrial fibrillation, low voltage QRS complexes, CKD, and polyneuropathy there was concern for possible amyloid - Underwent PYP scan on 8/8 with equivocal study per my review; unlikely amyloid is the primary driver of heart failure - Now, patient is admitted with sepsis. Receiving IV abx  - Wt has been increasing; Na low at 130 (dry weight 274-277lbs based on last discharge) - Will start lasix '100mg'$  IV BID and monitor response - Needs daily standing weights to help gauge response to IV diuresis - Not on ACEi/ARB/ARNI or MRA due to poor renal function  Chronic Atrial Fibrillation - Continue metop 12.'5mg'$  BID - Avoid dilt due to severely reduced RV systolic function - PTA patient was taking xarelto 15 mg daily, developed oral bleeding. Was later switched to eliqius 2.5 mg BID, also developed oral bleeding.  - CHADS-VASc 5, stared on IV heparin and patient denies any bleeding. Hopefully can transition back to PO apixaban prior to discharge if no recurrence of bleeding  Strep Bacteremia Septic Shock -ID following -On cefazolin -Planned for TEE Monday; high risk for anesthesia given RV  dysfunction and pulmonary HTN; discussed with Dr. Haroldine Laws and he will tentatively plan to perform TEE on Monday pending availability   Otherwise per primary  - Sepsis with unclear source  - Acute encephalopathy  - Longstanding ulcerations of BLE - L knee wound  - Type 2 DM- peripheral neuropathy  - COPD - GERD  -Gout - Hypothyroidism      For questions or updates, please contact [facility] Please consult [email or web address] for contact info under        Signed, Freada Bergeron, MD  10/31/2021, 2:12 PM

## 2021-10-31 NOTE — Progress Notes (Signed)
Patient back from MRI and safety ensured.

## 2021-10-31 NOTE — Progress Notes (Signed)
[facility]    Date of Admission:  10/28/2021   Total days of antibiotics 4          ID: Kristine [name] is a 76 y.o. female with group b strep bacteremia Principal Problem:   Septic shock ([place]) Active Problems:   Pressure injury of skin    Subjective: Afebrile. Sleeping.  Medications:   allopurinol  300 mg Oral q AM   DULoxetine  30 mg Oral Daily   insulin aspart  0-20 Units Subcutaneous TID WC   insulin aspart  0-5 Units Subcutaneous QHS   insulin glargine-yfgn  25 Units Subcutaneous Daily   levothyroxine  88 mcg Oral q morning   metoprolol tartrate  12.5 mg Oral BID   pregabalin  50 mg Oral BID   Vitamin D (Ergocalciferol)  50,000 Units Oral Q7 days    Objective: Vital signs in last 24 hours: Temp:  [96.8 F (36 C)-97.2 F (36.2 C)] 96.8 F (36 C) (08/11 0338) Pulse Rate:  [78-96] 89 (08/11 1234) Resp:  [15-17] 15 (08/11 1234) BP: (116-135)/(65-90) 132/85 (08/11 1234) SpO2:  [94 %-100 %] 94 % (08/11 0326) Weight:  [137.1 kg] 137.1 kg (08/11 0338)  Physical Exam  Constitutional:  oriented to person, place, and time. appears well-developed and well-nourished. No distress.  HENT: [place]/AT, PERRLA, no scleral icterus Mouth/Throat: Oropharynx is clear and moist. No oropharyngeal exudate.  Cardiovascular: Normal rate, regular rhythm and normal heart sounds. Exam reveals no gallop and no friction rub.  No murmur heard.  Pulmonary/Chest: Effort normal and breath sounds normal. No respiratory distress.  has no wheezes.  Neck = supple, no nuchal rigidity Abdominal: Soft. Bowel sounds are normal.  exhibits no distension. There is no tenderness.  Ext: left knee bandaged. Left foot dfu+ Skin: Skin is warm and dry. No rash noted. No erythema.  Psychiatric: a normal mood and affect.  behavior is normal.    Lab Results Recent Labs    10/30/21 0058 10/31/21 0325  WBC 24.3* 15.8*  HGB 10.7* 10.7*  HCT 32.4* 32.7*  NA 132* 130*  K 4.2 3.4*   CL 93* 93*  CO2 26 23  BUN 90* 101*  CREATININE 2.98* 2.82*   Liver Panel Recent Labs    10/28/21 1920 10/29/21 0439  PROT 7.8 5.7*  ALBUMIN 4.0 2.9*  AST 21 16  ALT 9 9  ALKPHOS 110 76  BILITOT 2.2* 2.1*    Microbiology: 8/11 blood cx ngtd 8/8 blood cx GBS Studies/Results: MR KNEE LEFT WO CONTRAST  Result Date: 10/30/2021 CLINICAL DATA:  Right knee pain. EXAM: MRI OF THE LEFT KNEE WITHOUT CONTRAST TECHNIQUE: Multiplanar, multisequence MR imaging of the knee was performed. No intravenous contrast was administered. COMPARISON:  Radiographs 10/28/2021 FINDINGS: Examination is extremely limited. There are 2 series other than the scout sequence all of which demonstrate significant motion artifact. There is a small to moderate-sized knee joint effusion. I do not see any grossly abnormal T2 signal intensity to suggest septic arthritis or osteomyelitis. There is diffuse subcutaneous soft tissue swelling/edema/fluid suggesting cellulitis there is a small fluid collection superficial to the lower lateral retinaculum which measures 19 mm and could be a small abscess. Suspect diffuse myofasciitis. IMPRESSION: 1. Very limited examination due to motion artifact. 2. Diffuse subcutaneous soft tissue swelling/edema/fluid suggesting cellulitis. Small fluid collection near the lateral retinaculum could be a small subcutaneous abscess. 3. Suspect mild myofasciitis without definite findings for pyomyositis. 4. Small to moderate-sized knee joint  effusion. 5. No grossly abnormal T2 signal intensity to suggest septic arthritis or osteomyelitis. Electronically Signed   By: Marijo Sanes M.D.   On: 10/30/2021 14:02     Assessment/Plan: Group b strep sepsis = continue on cefazolin 2gm IV q 8hr, source of infection not completely clear possibly left knee vs. Left foot diabetic ulcer  - appreciate plastics and ortho evaluation. Will defer doing any aspiration of joint since mostly extra-articular abnormality for  which iv abtx would treat cellulitis,myositis.  - recommend imaging of left foot to see if signs of early osteomyelitis from DFU.  - appreciate cardiology doing TEE to rule out endocarditis - continue to treat for bacteremia - Dr. [place] Bali is covering this weekend for questions, we will see back on [facility] Pager: [phone]  10/31/2021, 3:42 PM

## 2021-10-31 NOTE — Progress Notes (Signed)
PT Cancellation Note  Patient Details Name: Kristine [name] MRN: [id number] DOB: [birth year]   Cancelled Treatment:    Reason Eval/Treat Not Completed: (P) Patient at procedure or test/unavailable (off unit at IR and MRI per RN) Will continue efforts per POC.   Kara Pacer Renny Remer 10/31/2021, 6:44 PM

## 2021-10-31 NOTE — Inpatient Diabetes Management (Addendum)
Inpatient Diabetes Program Recommendations  AACE/ADA: New Consensus Statement on Inpatient Glycemic Control (2015)  Target Ranges:  Prepandial:   less than 140 mg/dL      Peak postprandial:   less than 180 mg/dL (1-2 hours)      Critically ill patients:  140 - 180 mg/dL   Lab Results  Component Value Date   GLUCAP 248 (H) 10/31/2021   HGBA1C 7.0 (H) 09/11/2021    Latest Reference Range & Units 10/30/21 08:09 10/30/21 12:29 10/30/21 16:41 10/30/21 21:31 10/31/21 08:29  Glucose-Capillary 70 - 99 mg/dL 204 (H) 279 (H) 297 (H) 284 (H) 248 (H)  (H): Data is abnormally high Review of Glycemic Control  Diabetes history: type 2 Outpatient Diabetes medications: Tresiba 90 units BID, Novolog 40-70 units sliding scale TID Current orders for Inpatient glycemic control: Semglee 25 units daily, Novolog 0-20 units TID, 0-5 units at [facility]  Inpatient Diabetes Program Recommendations:   CBGs have been greater than 200 mg/dl.  Recommend increasing Semglee to 30 units daily, add Novolog 4 units TID with meals if eating at least 50% of meal and blood sugars continue to be elevated. Titrate dosages as needed.  Harvel Ricks RN BSN CDE Diabetes Coordinator Pager: [phone]  8am-5pm

## 2021-10-31 NOTE — Consult Note (Addendum)
Reason for Consult/CC:left knee  Kristine [name] is an 76 y.o. female.  HPI: 75 year old status post left knee hematoma after patella fracture.  I performed and I and D of this wound.  She does not note any additional knee pain recently but does note some swelling in her left lower extremity.  Allergies:  Allergies  Allergen Reactions   Albuterol Other (See Comments)    REACTION: Tachycardia- AFib   Epinephrine Other (See Comments)    Increases heart rate per patient.    Penicillins Other (See Comments)    REACTION: flushing' \\T'$ \ hot  Did it involve swelling of the face/tongue/throat, SOB, or low BP? No Did it involve sudden or severe rash/hives, skin peeling, or any reaction on the inside of your mouth or nose? No Did you need to seek medical attention at a hospital or doctor's office? No When did it last happen?    20 years ago   If all above answers are "NO", may proceed with cephalosporin use.   Ceftriaxone Other (See Comments)    Sweats   Citalopram Hydrobromide Other (See Comments)    Prolonged QT prolongation   Exenatide Other (See Comments)    Unknown reaction   Ketorolac Tromethamine Hives and Swelling    swelling in hands   Tizanidine Other (See Comments)    lethargic   Torsemide Swelling   Zoledronic Acid Other (See Comments)    Pt was hospitalized due to medication   Bee Venom Other (See Comments)    "makes me nervous"   Ivp Dye [Iodinated Contrast Media] Other (See Comments)    flushing   Keflex [Cephalexin] Other (See Comments)    Makes her feel warm; she was told never to take it.    Medications:  Current Facility-Administered Medications:    acetaminophen (TYLENOL) tablet 650 mg, 650 mg, Oral, Q6H PRN, Anders Simmonds, MD, 650 mg at 10/30/21 0125   allopurinol (ZYLOPRIM) tablet 300 mg, 300 mg, Oral, q AM, Elgergawy, Silver Huguenin, MD, 300 mg at 10/31/21 0858   ALPRAZolam (XANAX) tablet 0.5 mg, 0.5 mg, Oral, Q6H PRN, Elgergawy, Silver Huguenin, MD   ceFAZolin  (ANCEF) IVPB 2g/100 mL premix, 2 g, Intravenous, Q12H, Anders Simmonds, MD, Last Rate: 200 mL/hr at 10/31/21 0908, 2 g at 10/31/21 0908   diphenhydrAMINE (BENADRYL) injection 25 mg, 25 mg, Intravenous, Q6H PRN, Anders Simmonds, MD   docusate sodium (COLACE) capsule 100 mg, 100 mg, Oral, BID PRN, Ayesha Rumpf, Stephanie M, PA-C   DULoxetine (CYMBALTA) DR capsule 30 mg, 30 mg, Oral, Daily, Elgergawy, Silver Huguenin, MD, 30 mg at 10/31/21 0900   furosemide (LASIX) tablet 120 mg, 120 mg, Oral, BID, Margie Billet, PA-C, 120 mg at 10/31/21 0858   heparin ADULT infusion 100 units/mL (25000 units/244m), 1,500 Units/hr, Intravenous, Continuous, AFranky Macho RPH, Last Rate: 15 mL/hr at 10/31/21 0900, 1,500 Units/hr at 10/31/21 0900   insulin aspart (novoLOG) injection 0-20 Units, 0-20 Units, Subcutaneous, TID WC, Elgergawy, DSilver Huguenin MD, 7 Units at 10/31/21 0857   insulin aspart (novoLOG) injection 0-5 Units, 0-5 Units, Subcutaneous, QHS, Elgergawy, DSilver Huguenin MD, 3 Units at 10/30/21 2200   insulin glargine-yfgn (SEMGLEE) injection 25 Units, 25 Units, Subcutaneous, Daily, Elgergawy, DSilver Huguenin MD, 25 Units at 10/31/21 0901   levalbuterol (XOPENEX) nebulizer solution 0.63 mg, 0.63 mg, Nebulization, Q3H PRN, PMick Sell PA-C   levothyroxine (SYNTHROID) tablet 88 mcg, 88 mcg, Oral, q morning, Elgergawy, DSilver Huguenin MD, 88 mcg at 10/31/21  0516   metoprolol tartrate (LOPRESSOR) tablet 12.5 mg, 12.5 mg, Oral, BID, Margie Billet, PA-C, 12.5 mg at 10/31/21 0900   mineral oil-hydrophilic petrolatum (AQUAPHOR) ointment, , Topical, Daily PRN, Collier Bullock, MD   Oral care mouth rinse, 15 mL, Mouth Rinse, PRN, Collier Bullock, MD   polyethylene glycol (MIRALAX / GLYCOLAX) packet 17 g, 17 g, Oral, Daily PRN, [place] Crane M, PA-C   pregabalin (LYRICA) capsule 50 mg, 50 mg, Oral, BID, Elgergawy, Silver Huguenin, MD, 50 mg at 10/31/21 0901   traMADol (ULTRAM) tablet 50 mg, 50 mg, Oral, TID PRN, Elgergawy, Silver Huguenin, MD,  50 mg at 10/31/21 0857   Vitamin D (Ergocalciferol) (DRISDOL) 1.25 MG (50000 UNIT) capsule 50,000 Units, 50,000 Units, Oral, Q7 days, Elgergawy, Silver Huguenin, MD, 50,000 Units at 10/31/21 0901  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   Anxiety    Arthritis    Asthma    Atrial fibrillation (HCC)    CAP (community acquired pneumonia) 05/31/2015   Cataract    CHF (congestive heart failure) (HCC)    Chronic bronchitis (HCC)    Chronic kidney disease    Clotting disorder ([place])    Community acquired pneumonia 05/31/2015   Depression    Dysrhythmia    GERD (gastroesophageal reflux disease)    High cholesterol    History of hiatal hernia    HTN (hypertension)    Mild aortic sclerosis    Morbid obesity ([place])    Neuromuscular disorder ([place])    Neuropathic pain    Osteoporosis    Persistent atrial fibrillation ([place])    Scoliosis    Sleep apnea    Spinal stenosis    Thyroid disease    Type II diabetes mellitus ([place])    Vitamin D deficiency     Past Surgical History:  Procedure Laterality Date   BLADDER SUSPENSION  1980s   CARDIAC CATHETERIZATION  11/16/09   SMOOTH AND NORMAL   CARDIOVERSION N/A 01/07/2015   Procedure: CARDIOVERSION;  Surgeon: Skeet Latch, MD;  Location: [facility];  Service: Cardiovascular;  Laterality: N/A;   CARDIOVERSION N/A 03/11/2015   Procedure: CARDIOVERSION;  Surgeon: Skeet Latch, MD;  Location: [facility];  Service: Cardiovascular;  Laterality: N/A;   CARPAL TUNNEL RELEASE Left 1970s   CATARACT EXTRACTION W/ INTRAOCULAR LENS  IMPLANT, BILATERAL Bilateral ~ 2010   COLONOSCOPY  08/14/08   FINGER FRACTURE SURGERY Right 1970s   "ring finger"   FRACTURE SURGERY     HEMATOMA EVACUATION Left 05/21/2021   Procedure: EVACUATION LEFT KNEE HEMATOMA;  Surgeon: Lennice Sites, MD;  Location: [facility];  Service: Plastics;  Laterality: Left;   LAPAROSCOPIC CHOLECYSTECTOMY  1980s   TUBAL LIGATION     VAGINAL  HYSTERECTOMY  1980    Family History  Problem Relation Age of Onset   Stroke Father    Hypertension Father    Atrial fibrillation Father        HAD MURMUR   Heart failure Mother    Congestive Heart Failure Mother    Hypertension Mother    Hypertension Brother    Hypertension Sister    Hypertension Son    CAD Sister        EARLY   CAD Brother        EARLY   Colon cancer Neg Hx    Esophageal cancer Neg Hx    Stomach cancer Neg Hx    Rectal cancer Neg Hx  Social History:  reports that she has never smoked. She has never used smokeless tobacco. She reports that she does not drink alcohol and does not use drugs.  Physical Exam Blood pressure 126/79, pulse 95, temperature (S) (!) 96.8 F (36 C), temperature source Rectal, resp. rate 17, height '5\' 9"'$  (1.753 m), weight (!) 137.1 kg, SpO2 94 %. General: alert, NAD Extremity:   left lower leg with no fluid collection palpable on physical exam.  Nickel sized wound over patella  Results for orders placed or performed during the hospital encounter of 10/28/21 (from the past 48 hour(s))  Glucose, capillary     Status: None   Collection Time: 10/29/21 12:28 PM  Result Value Ref Range   Glucose-Capillary 79 70 - 99 mg/dL    Comment: Glucose reference range applies only to samples taken after fasting for at least 8 hours.  Glucose, capillary     Status: None   Collection Time: 10/29/21  5:05 PM  Result Value Ref Range   Glucose-Capillary 89 70 - 99 mg/dL    Comment: Glucose reference range applies only to samples taken after fasting for at least 8 hours.  Basic metabolic panel     Status: Abnormal   Collection Time: 10/29/21  7:25 PM  Result Value Ref Range   Sodium 132 (L) 135 - 145 mmol/L   Potassium 4.1 3.5 - 5.1 mmol/L    Comment: DELTA CHECK NOTED   Chloride 93 (L) 98 - 111 mmol/L   CO2 22 22 - 32 mmol/L   Glucose, Bld 145 (H) 70 - 99 mg/dL    Comment: Glucose reference range applies only to samples taken after fasting for  at least 8 hours.   BUN 88 (H) 8 - 23 mg/dL   Creatinine, Ser 2.80 (H) 0.44 - 1.00 mg/dL   Calcium 9.0 8.9 - 10.3 mg/dL   GFR, Estimated 17 (L) >60 mL/min    Comment: (NOTE) Calculated using the CKD-EPI Creatinine Equation (2021)    Anion gap 17 (H) 5 - 15    Comment: Performed at [facility] [address], [place], [place] [address]  Glucose, capillary     Status: Abnormal   Collection Time: 10/29/21  8:22 PM  Result Value Ref Range   Glucose-Capillary 160 (H) 70 - 99 mg/dL    Comment: Glucose reference range applies only to samples taken after fasting for at least 8 hours.  Glucose, capillary     Status: Abnormal   Collection Time: 10/29/21 11:46 PM  Result Value Ref Range   Glucose-Capillary 206 (H) 70 - 99 mg/dL    Comment: Glucose reference range applies only to samples taken after fasting for at least 8 hours.  Heparin level (unfractionated)     Status: None   Collection Time: 10/30/21 12:58 AM  Result Value Ref Range   Heparin Unfractionated 0.30 0.30 - 0.70 IU/mL    Comment: (NOTE) The clinical reportable range upper limit is being lowered to >1.10 to align with the FDA approved guidance for the current laboratory assay.  If heparin results are below expected values, and patient dosage has  been confirmed, suggest follow up testing of antithrombin III levels. Performed at [facility], [place] [address] [place], [place] [address]   Basic metabolic panel     Status: Abnormal   Collection Time: 10/30/21 12:58 AM  Result Value Ref Range   Sodium 132 (L) 135 - 145 mmol/L   Potassium 4.2 3.5 - 5.1  mmol/L   Chloride 93 (L) 98 - 111 mmol/L   CO2 26 22 - 32 mmol/L   Glucose, Bld 200 (H) 70 - 99 mg/dL    Comment: Glucose reference range applies only to samples taken after fasting for at least 8 hours.   BUN 90 (H) 8 - 23 mg/dL   Creatinine, Ser 2.98 (H) 0.44 - 1.00 mg/dL   Calcium 9.2 8.9 - 10.3 mg/dL   GFR, Estimated 16 (L) >60 mL/min    Comment:  (NOTE) Calculated using the CKD-EPI Creatinine Equation (2021)    Anion gap 13 5 - 15    Comment: Performed at [facility] [address] [place],  [address]  Procalcitonin     Status: None   Collection Time: 10/30/21 12:58 AM  Result Value Ref Range   Procalcitonin 26.96 ng/mL    Comment:        Interpretation: PCT >= 10 ng/mL: Important systemic inflammatory response, almost exclusively due to severe bacterial sepsis or septic shock. (NOTE)       Sepsis PCT Algorithm           Lower Respiratory Tract                                      Infection PCT Algorithm    ----------------------------     ----------------------------         PCT < 0.25 ng/mL                PCT < 0.10 ng/mL          Strongly encourage             Strongly discourage   discontinuation of antibiotics    initiation of antibiotics    ----------------------------     -----------------------------       PCT 0.25 - 0.50 ng/mL            PCT 0.10 - 0.25 ng/mL               OR       >80% decrease in PCT            Discourage initiation of                                            antibiotics      Encourage discontinuation           of antibiotics    ----------------------------     -----------------------------         PCT >= 0.50 ng/mL              PCT 0.26 - 0.50 ng/mL                AND       <80% decrease in PCT             Encourage initiation of                                             antibiotics       Encourage continuation           of antibiotics    ----------------------------     -----------------------------  PCT >= 0.50 ng/mL                  PCT > 0.50 ng/mL               AND         increase in PCT                  Strongly encourage                                      initiation of antibiotics    Strongly encourage escalation           of antibiotics                                     -----------------------------                                           PCT <=  0.25 ng/mL                                                 OR                                        > 80% decrease in PCT                                      Discontinue / Do not initiate                                             antibiotics  Performed at [facility], [address] [place], [place] [address]   CBC     Status: Abnormal   Collection Time: 10/30/21 12:58 AM  Result Value Ref Range   WBC 24.3 (H) 4.0 - 10.5 K/uL   RBC 3.70 (L) 3.87 - 5.11 MIL/uL   Hemoglobin 10.7 (L) 12.0 - 15.0 g/dL   HCT 32.4 (L) 36.0 - 46.0 %   MCV 87.6 80.0 - 100.0 fL   MCH 28.9 26.0 - 34.0 pg   MCHC 33.0 30.0 - 36.0 g/dL   RDW 16.6 (H) 11.5 - 15.5 %   Platelets 141 (L) 150 - 400 K/uL   nRBC 0.0 0.0 - 0.2 %    Comment: Performed at [facility], [place] [address] [place], [place] [address]  Glucose, capillary     Status: Abnormal   Collection Time: 10/30/21  3:56 AM  Result Value Ref Range   Glucose-Capillary 210 (H) 70 - 99 mg/dL    Comment: Glucose reference range applies only to samples taken after fasting for at least 8 hours.  Glucose, capillary     Status: Abnormal   Collection Time: 10/30/21  6:30 AM  Result Value  Ref Range   Glucose-Capillary 217 (H) 70 - 99 mg/dL    Comment: Glucose reference range applies only to samples taken after fasting for at least 8 hours.  Glucose, capillary     Status: Abnormal   Collection Time: 10/30/21  8:09 AM  Result Value Ref Range   Glucose-Capillary 204 (H) 70 - 99 mg/dL    Comment: Glucose reference range applies only to samples taken after fasting for at least 8 hours.  Glucose, capillary     Status: Abnormal   Collection Time: 10/30/21 12:29 PM  Result Value Ref Range   Glucose-Capillary 279 (H) 70 - 99 mg/dL    Comment: Glucose reference range applies only to samples taken after fasting for at least 8 hours.  Heparin level (unfractionated)     Status: None   Collection Time: 10/30/21  3:11 PM  Result Value Ref Range    Heparin Unfractionated 0.42 0.30 - 0.70 IU/mL    Comment: (NOTE) The clinical reportable range upper limit is being lowered to >1.10 to align with the FDA approved guidance for the current laboratory assay.  If heparin results are below expected values, and patient dosage has  been confirmed, suggest follow up testing of antithrombin III levels. Performed at [facility], [place] [address] [place], [place] [address]   Glucose, capillary     Status: Abnormal   Collection Time: 10/30/21  4:41 PM  Result Value Ref Range   Glucose-Capillary 297 (H) 70 - 99 mg/dL    Comment: Glucose reference range applies only to samples taken after fasting for at least 8 hours.  Glucose, capillary     Status: Abnormal   Collection Time: 10/30/21  9:31 PM  Result Value Ref Range   Glucose-Capillary 284 (H) 70 - 99 mg/dL    Comment: Glucose reference range applies only to samples taken after fasting for at least 8 hours.  Procalcitonin     Status: None   Collection Time: 10/31/21  3:25 AM  Result Value Ref Range   Procalcitonin 22.03 ng/mL    Comment:        Interpretation: PCT >= 10 ng/mL: Important systemic inflammatory response, almost exclusively due to severe bacterial sepsis or septic shock. (NOTE)       Sepsis PCT Algorithm           Lower Respiratory Tract                                      Infection PCT Algorithm    ----------------------------     ----------------------------         PCT < 0.25 ng/mL                PCT < 0.10 ng/mL          Strongly encourage             Strongly discourage   discontinuation of antibiotics    initiation of antibiotics    ----------------------------     -----------------------------       PCT 0.25 - 0.50 ng/mL            PCT 0.10 - 0.25 ng/mL               OR       >80% decrease in PCT            Discourage initiation of  antibiotics      Encourage discontinuation           of antibiotics     ----------------------------     -----------------------------         PCT >= 0.50 ng/mL              PCT 0.26 - 0.50 ng/mL                AND       <80% decrease in PCT             Encourage initiation of                                             antibiotics       Encourage continuation           of antibiotics    ----------------------------     -----------------------------        PCT >= 0.50 ng/mL                  PCT > 0.50 ng/mL               AND         increase in PCT                  Strongly encourage                                      initiation of antibiotics    Strongly encourage escalation           of antibiotics                                     -----------------------------                                           PCT <= 0.25 ng/mL                                                 OR                                        > 80% decrease in PCT                                      Discontinue / Do not initiate                                             antibiotics  Performed at [facility], [place] [address] [place], [place] [address]   Heparin level (unfractionated)     Status: None  Collection Time: 10/31/21  3:25 AM  Result Value Ref Range   Heparin Unfractionated 0.31 0.30 - 0.70 IU/mL    Comment: (NOTE) The clinical reportable range upper limit is being lowered to >1.10 to align with the FDA approved guidance for the current laboratory assay.  If heparin results are below expected values, and patient dosage has  been confirmed, suggest follow up testing of antithrombin III levels. Performed at [facility], [place] [address], [place], [place] [address]   Basic metabolic panel     Status: Abnormal   Collection Time: 10/31/21  3:25 AM  Result Value Ref Range   Sodium 130 (L) 135 - 145 mmol/L   Potassium 3.4 (L) 3.5 - 5.1 mmol/L   Chloride 93 (L) 98 - 111 mmol/L   CO2 23 22 - 32 mmol/L   Glucose, Bld 240 (H) 70 - 99 mg/dL    Comment:  Glucose reference range applies only to samples taken after fasting for at least 8 hours.   BUN 101 (H) 8 - 23 mg/dL   Creatinine, Ser 2.82 (H) 0.44 - 1.00 mg/dL   Calcium 9.1 8.9 - 10.3 mg/dL   GFR, Estimated 17 (L) >60 mL/min    Comment: (NOTE) Calculated using the CKD-EPI Creatinine Equation (2021)    Anion gap 14 5 - 15    Comment: Performed at [facility] [address], [place], [place] [address]  CBC     Status: Abnormal   Collection Time: 10/31/21  3:25 AM  Result Value Ref Range   WBC 15.8 (H) 4.0 - 10.5 K/uL   RBC 3.74 (L) 3.87 - 5.11 MIL/uL   Hemoglobin 10.7 (L) 12.0 - 15.0 g/dL   HCT 32.7 (L) 36.0 - 46.0 %   MCV 87.4 80.0 - 100.0 fL   MCH 28.6 26.0 - 34.0 pg   MCHC 32.7 30.0 - 36.0 g/dL   RDW 16.6 (H) 11.5 - 15.5 %   Platelets 142 (L) 150 - 400 K/uL   nRBC 0.0 0.0 - 0.2 %    Comment: Performed at [facility], [place] [address] [place], [place] [address]  Glucose, capillary     Status: Abnormal   Collection Time: 10/31/21  8:29 AM  Result Value Ref Range   Glucose-Capillary 248 (H) 70 - 99 mg/dL    Comment: Glucose reference range applies only to samples taken after fasting for at least 8 hours.    MR KNEE LEFT WO CONTRAST  Result Date: 10/30/2021 CLINICAL DATA:  Right knee pain. EXAM: MRI OF THE LEFT KNEE WITHOUT CONTRAST TECHNIQUE: Multiplanar, multisequence MR imaging of the knee was performed. No intravenous contrast was administered. COMPARISON:  Radiographs 10/28/2021 FINDINGS: Examination is extremely limited. There are 2 series other than the scout sequence all of which demonstrate significant motion artifact. There is a small to moderate-sized knee joint effusion. I do not see any grossly abnormal T2 signal intensity to suggest septic arthritis or osteomyelitis. There is diffuse subcutaneous soft tissue swelling/edema/fluid suggesting cellulitis there is a small fluid collection superficial to the lower lateral retinaculum which measures 19 mm and  could be a small abscess. Suspect diffuse myofasciitis. IMPRESSION: 1. Very limited examination due to motion artifact. 2. Diffuse subcutaneous soft tissue swelling/edema/fluid suggesting cellulitis. Small fluid collection near the lateral retinaculum could be a small subcutaneous abscess. 3. Suspect mild myofasciitis without definite findings for pyomyositis. 4. Small to moderate-sized knee joint effusion. 5. No grossly abnormal T2 signal intensity to  suggest septic arthritis or osteomyelitis. Electronically Signed   By: Marijo Sanes M.D.   On: 10/30/2021 14:02    MRI:  reviewed personally in detail and do not see a significant fluid collection that would warrant drainage in the OR.    Assessment/Plan: 76 year old with sepsis 1) recommend formal orthopaedic consult.  I think her left knee is a very unlikely but possible source of her sepsis.  No superficial fluid collection is present in my previous surgical site to drain. 2) Would recommend broad spectrum antibiotics to include MRSA and gram negative coverage such as Vanc/Zosyn until a source could be found. Would defer to medicine service on choice of antibiotics though.  30 minutes of ward time were spent with the patient.  Time was spent reviewing records, discussing surgical procedures with the patient and reviewing risks and benefits.  We discussed no surgical procedure at this time.  Lennice Sites 10/31/2021, 10:49 AM

## 2021-10-31 NOTE — Procedures (Signed)
Interventional Radiology Procedure Note  Procedure: US guided aspiration of 1.5 mL purulent fluid from lateral LEFT knee.   Complications: None  Estimated Blood Loss: None  Recommendations: - Sent for culture   Signed,  Criselda Peaches, MD

## 2021-10-31 NOTE — Consult Note (Signed)
Reason for Consult:R/o septic arthritis Referring Physician: Phillips Climes Time called: 1324 Time at bedside: [address] Kristine [name] is an 76 y.o. female.  HPI: Kristine [name] was admitted 2d ago with bacteremia of unknown etiology. Workup thus far has been negative for source. She had a hematoma drained from her left knee 68mago by plastics. They were consulted and did not see any issues with that or her wound. She has chronic left knee pain and so MRI was obtained that was very poor quality but showed a possible small fluid collection in the extraarticular tissues around the knee. Orthopedic surgery was consulted for an opinion.  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   Anxiety    Arthritis    Asthma    Atrial fibrillation ([place]    CAP (community acquired pneumonia) 05/31/2015   Cataract    CHF (congestive heart failure) (HCC)    Chronic bronchitis (HCC)    Chronic kidney disease    Clotting disorder ([place]    Community acquired pneumonia 05/31/2015   Depression    Dysrhythmia    GERD (gastroesophageal reflux disease)    High cholesterol    History of hiatal hernia    HTN (hypertension)    Mild aortic sclerosis    Morbid obesity (HCC)    Neuromuscular disorder ([place]    Neuropathic pain    Osteoporosis    Persistent atrial fibrillation ([facility]    Scoliosis    Sleep apnea    Spinal stenosis    Thyroid disease    Type II diabetes mellitus ([facility]    Vitamin D deficiency     Past Surgical History:  Procedure Laterality Date   BLADDER SUSPENSION  1980s   CARDIAC CATHETERIZATION  11/16/09   SMOOTH AND NORMAL   CARDIOVERSION N/A 01/07/2015   Procedure: CARDIOVERSION;  Surgeon: TSkeet Latch MD;  [facility]  Service: Cardiovascular;  Laterality: N/A;   CARDIOVERSION N/A 03/11/2015   Procedure: CARDIOVERSION;  Surgeon: TSkeet Latch MD;  [place] [facility]  Service: Cardiovascular;  Laterality: N/A;   CARPAL  TUNNEL RELEASE Left 1970s   CATARACT EXTRACTION W/ INTRAOCULAR LENS  IMPLANT, BILATERAL Bilateral ~ 2010   COLONOSCOPY  08/14/08   FINGER FRACTURE SURGERY Right 1970s   "ring finger"   FRACTURE SURGERY     HEMATOMA EVACUATION Left 05/21/2021   Procedure: EVACUATION LEFT KNEE HEMATOMA;  Surgeon: LLennice Sites MD;  [facility] [facility]  Service: Plastics;  Laterality: Left;   LAPAROSCOPIC CHOLECYSTECTOMY  1980s   TUBAL LIGATION     VAGINAL HYSTERECTOMY  1980    Family History  Problem Relation Age of Onset   Stroke Father    Hypertension Father    Atrial fibrillation Father        HAD MURMUR   Heart failure Mother    Congestive Heart Failure Mother    Hypertension Mother    Hypertension Brother    Hypertension Sister    Hypertension Son    CAD Sister        EARLY   CAD Brother        EARLY   Colon cancer Neg Hx    Esophageal cancer Neg Hx    Stomach cancer Neg Hx    Rectal cancer Neg Hx     Social History:  reports that she has never smoked. She has never used smokeless tobacco. She reports that she does not drink alcohol and does not use  drugs.  Allergies:  Allergies  Allergen Reactions   Albuterol Other (See Comments)    REACTION: Tachycardia- AFib   Epinephrine Other (See Comments)    Increases heart rate per patient.    Penicillins Other (See Comments)    REACTION: flushing' \\T'$ \ hot  Did it involve swelling of the face/tongue/throat, SOB, or low BP? No Did it involve sudden or severe rash/hives, skin peeling, or any reaction on the inside of your mouth or nose? No Did you need to seek medical attention at a hospital or doctor's office? No When did it last happen?    20 years ago   If all above answers are "NO", may proceed with cephalosporin use.   Ceftriaxone Other (See Comments)    Sweats   Citalopram Hydrobromide Other (See Comments)    Prolonged QT prolongation   Exenatide Other (See Comments)    Unknown reaction   Ketorolac Tromethamine Hives and Swelling     swelling in hands   Tizanidine Other (See Comments)    lethargic   Torsemide Swelling   Zoledronic Acid Other (See Comments)    Pt was hospitalized due to medication   Bee Venom Other (See Comments)    "makes me nervous"   Ivp Dye [Iodinated Contrast Media] Other (See Comments)    flushing   Keflex [Cephalexin] Other (See Comments)    Makes her feel warm; she was told never to take it.    Medications: I have reviewed the patient's current medications.  Results for orders placed or performed during the hospital encounter of 10/28/21 (from the past 48 hour(s))  Glucose, capillary     Status: None   Collection Time: 10/29/21 12:28 PM  Result Value Ref Range   Glucose-Capillary 79 70 - 99 mg/dL    Comment: Glucose reference range applies only to samples taken after fasting for at least 8 hours.  Glucose, capillary     Status: None   Collection Time: 10/29/21  5:05 PM  Result Value Ref Range   Glucose-Capillary 89 70 - 99 mg/dL    Comment: Glucose reference range applies only to samples taken after fasting for at least 8 hours.  Basic metabolic panel     Status: Abnormal   Collection Time: 10/29/21  7:25 PM  Result Value Ref Range   Sodium 132 (L) 135 - 145 mmol/L   Potassium 4.1 3.5 - 5.1 mmol/L    Comment: DELTA CHECK NOTED   Chloride 93 (L) 98 - 111 mmol/L   CO2 22 22 - 32 mmol/L   Glucose, Bld 145 (H) 70 - 99 mg/dL    Comment: Glucose reference range applies only to samples taken after fasting for at least 8 hours.   BUN 88 (H) 8 - 23 mg/dL   Creatinine, Ser 2.80 (H) 0.44 - 1.00 mg/dL   Calcium 9.0 8.9 - 10.3 mg/dL   GFR, Estimated 17 (L) >60 mL/min    Comment: (NOTE) Calculated using the CKD-EPI Creatinine Equation (2021)    Anion gap 17 (H) 5 - 15    Comment: Performed at [facility] [address] [place], [place] [address]  Glucose, capillary     Status: Abnormal   Collection Time: 10/29/21  8:22 PM  Result Value Ref Range   Glucose-Capillary 160 (H)  70 - 99 mg/dL    Comment: Glucose reference range applies only to samples taken after fasting for at least 8 hours.  Glucose, capillary     Status: Abnormal  Collection Time: 10/29/21 11:46 PM  Result Value Ref Range   Glucose-Capillary 206 (H) 70 - 99 mg/dL    Comment: Glucose reference range applies only to samples taken after fasting for at least 8 hours.  Heparin level (unfractionated)     Status: None   Collection Time: 10/30/21 12:58 AM  Result Value Ref Range   Heparin Unfractionated 0.30 0.30 - 0.70 IU/mL    Comment: (NOTE) The clinical reportable range upper limit is being lowered to >1.10 to align with the FDA approved guidance for the current laboratory assay.  If heparin results are below expected values, and patient dosage has  been confirmed, suggest follow up testing of antithrombin III levels. Performed at [facility], [place] [address] [place], [place] [address]   Basic metabolic panel     Status: Abnormal   Collection Time: 10/30/21 12:58 AM  Result Value Ref Range   Sodium 132 (L) 135 - 145 mmol/L   Potassium 4.2 3.5 - 5.1 mmol/L   Chloride 93 (L) 98 - 111 mmol/L   CO2 26 22 - 32 mmol/L   Glucose, Bld 200 (H) 70 - 99 mg/dL    Comment: Glucose reference range applies only to samples taken after fasting for at least 8 hours.   BUN 90 (H) 8 - 23 mg/dL   Creatinine, Ser 2.98 (H) 0.44 - 1.00 mg/dL   Calcium 9.2 8.9 - 10.3 mg/dL   GFR, Estimated 16 (L) >60 mL/min    Comment: (NOTE) Calculated using the CKD-EPI Creatinine Equation (2021)    Anion gap 13 5 - 15    Comment: Performed at [facility] [address], [place], [place] [address]  Procalcitonin     Status: None   Collection Time: 10/30/21 12:58 AM  Result Value Ref Range   Procalcitonin 26.96 ng/mL    Comment:        Interpretation: PCT >= 10 ng/mL: Important systemic inflammatory response, almost exclusively due to severe bacterial sepsis or septic shock. (NOTE)       Sepsis PCT  Algorithm           Lower Respiratory Tract                                      Infection PCT Algorithm    ----------------------------     ----------------------------         PCT < 0.25 ng/mL                PCT < 0.10 ng/mL          Strongly encourage             Strongly discourage   discontinuation of antibiotics    initiation of antibiotics    ----------------------------     -----------------------------       PCT 0.25 - 0.50 ng/mL            PCT 0.10 - 0.25 ng/mL               OR       >80% decrease in PCT            Discourage initiation of  antibiotics      Encourage discontinuation           of antibiotics    ----------------------------     -----------------------------         PCT >= 0.50 ng/mL              PCT 0.26 - 0.50 ng/mL                AND       <80% decrease in PCT             Encourage initiation of                                             antibiotics       Encourage continuation           of antibiotics    ----------------------------     -----------------------------        PCT >= 0.50 ng/mL                  PCT > 0.50 ng/mL               AND         increase in PCT                  Strongly encourage                                      initiation of antibiotics    Strongly encourage escalation           of antibiotics                                     -----------------------------                                           PCT <= 0.25 ng/mL                                                 OR                                        > 80% decrease in PCT                                      Discontinue / Do not initiate                                             antibiotics  Performed at [facility], [place] [address] [place], [place] [address]   CBC     Status: Abnormal   Collection Time: 10/30/21  12:58 AM  Result Value Ref Range   WBC 24.3 (H) 4.0 - 10.5 K/uL   RBC 3.70 (L) 3.87 - 5.11 MIL/uL    Hemoglobin 10.7 (L) 12.0 - 15.0 g/dL   HCT 32.4 (L) 36.0 - 46.0 %   MCV 87.6 80.0 - 100.0 fL   MCH 28.9 26.0 - 34.0 pg   MCHC 33.0 30.0 - 36.0 g/dL   RDW 16.6 (H) 11.5 - 15.5 %   Platelets 141 (L) 150 - 400 K/uL   nRBC 0.0 0.0 - 0.2 %    Comment: Performed at [facility] [address], [place], [place] [address]  Glucose, capillary     Status: Abnormal   Collection Time: 10/30/21  3:56 AM  Result Value Ref Range   Glucose-Capillary 210 (H) 70 - 99 mg/dL    Comment: Glucose reference range applies only to samples taken after fasting for at least 8 hours.  Glucose, capillary     Status: Abnormal   Collection Time: 10/30/21  6:30 AM  Result Value Ref Range   Glucose-Capillary 217 (H) 70 - 99 mg/dL    Comment: Glucose reference range applies only to samples taken after fasting for at least 8 hours.  Glucose, capillary     Status: Abnormal   Collection Time: 10/30/21  8:09 AM  Result Value Ref Range   Glucose-Capillary 204 (H) 70 - 99 mg/dL    Comment: Glucose reference range applies only to samples taken after fasting for at least 8 hours.  Glucose, capillary     Status: Abnormal   Collection Time: 10/30/21 12:29 PM  Result Value Ref Range   Glucose-Capillary 279 (H) 70 - 99 mg/dL    Comment: Glucose reference range applies only to samples taken after fasting for at least 8 hours.  Heparin level (unfractionated)     Status: None   Collection Time: 10/30/21  3:11 PM  Result Value Ref Range   Heparin Unfractionated 0.42 0.30 - 0.70 IU/mL    Comment: (NOTE) The clinical reportable range upper limit is being lowered to >1.10 to align with the FDA approved guidance for the current laboratory assay.  If heparin results are below expected values, and patient dosage has  been confirmed, suggest follow up testing of antithrombin III levels. Performed at [facility], [place] [address], [place], [place] [address]   Glucose, capillary     Status: Abnormal   Collection Time:  10/30/21  4:41 PM  Result Value Ref Range   Glucose-Capillary 297 (H) 70 - 99 mg/dL    Comment: Glucose reference range applies only to samples taken after fasting for at least 8 hours.  Glucose, capillary     Status: Abnormal   Collection Time: 10/30/21  9:31 PM  Result Value Ref Range   Glucose-Capillary 284 (H) 70 - 99 mg/dL    Comment: Glucose reference range applies only to samples taken after fasting for at least 8 hours.  Procalcitonin     Status: None   Collection Time: 10/31/21  3:25 AM  Result Value Ref Range   Procalcitonin 22.03 ng/mL    Comment:        Interpretation: PCT >= 10 ng/mL: Important systemic inflammatory response, almost exclusively due to severe bacterial sepsis or septic shock. (NOTE)       Sepsis PCT Algorithm           Lower Respiratory Tract  Infection PCT Algorithm    ----------------------------     ----------------------------         PCT < 0.25 ng/mL                PCT < 0.10 ng/mL          Strongly encourage             Strongly discourage   discontinuation of antibiotics    initiation of antibiotics    ----------------------------     -----------------------------       PCT 0.25 - 0.50 ng/mL            PCT 0.10 - 0.25 ng/mL               OR       >80% decrease in PCT            Discourage initiation of                                            antibiotics      Encourage discontinuation           of antibiotics    ----------------------------     -----------------------------         PCT >= 0.50 ng/mL              PCT 0.26 - 0.50 ng/mL                AND       <80% decrease in PCT             Encourage initiation of                                             antibiotics       Encourage continuation           of antibiotics    ----------------------------     -----------------------------        PCT >= 0.50 ng/mL                  PCT > 0.50 ng/mL               AND         increase in PCT                   Strongly encourage                                      initiation of antibiotics    Strongly encourage escalation           of antibiotics                                     -----------------------------                                           PCT <= 0.25 ng/mL  OR                                        > 80% decrease in PCT                                      Discontinue / Do not initiate                                             antibiotics  Performed at [facility], [place] [address] [place], [place] [address]   Heparin level (unfractionated)     Status: None   Collection Time: 10/31/21  3:25 AM  Result Value Ref Range   Heparin Unfractionated 0.31 0.30 - 0.70 IU/mL    Comment: (NOTE) The clinical reportable range upper limit is being lowered to >1.10 to align with the FDA approved guidance for the current laboratory assay.  If heparin results are below expected values, and patient dosage has  been confirmed, suggest follow up testing of antithrombin III levels. Performed at [facility], [place] [address], [place], [place] [address]   Basic metabolic panel     Status: Abnormal   Collection Time: 10/31/21  3:25 AM  Result Value Ref Range   Sodium 130 (L) 135 - 145 mmol/L   Potassium 3.4 (L) 3.5 - 5.1 mmol/L   Chloride 93 (L) 98 - 111 mmol/L   CO2 23 22 - 32 mmol/L   Glucose, Bld 240 (H) 70 - 99 mg/dL    Comment: Glucose reference range applies only to samples taken after fasting for at least 8 hours.   BUN 101 (H) 8 - 23 mg/dL   Creatinine, Ser 2.82 (H) 0.44 - 1.00 mg/dL   Calcium 9.1 8.9 - 10.3 mg/dL   GFR, Estimated 17 (L) >60 mL/min    Comment: (NOTE) Calculated using the CKD-EPI Creatinine Equation (2021)    Anion gap 14 5 - 15    Comment: Performed at [facility] [address] [place], [place] [address]  CBC     Status: Abnormal   Collection Time: 10/31/21  3:25 AM  Result Value Ref  Range   WBC 15.8 (H) 4.0 - 10.5 K/uL   RBC 3.74 (L) 3.87 - 5.11 MIL/uL   Hemoglobin 10.7 (L) 12.0 - 15.0 g/dL   HCT 32.7 (L) 36.0 - 46.0 %   MCV 87.4 80.0 - 100.0 fL   MCH 28.6 26.0 - 34.0 pg   MCHC 32.7 30.0 - 36.0 g/dL   RDW 16.6 (H) 11.5 - 15.5 %   Platelets 142 (L) 150 - 400 K/uL   nRBC 0.0 0.0 - 0.2 %    Comment: Performed at [facility], [place] [address], [place], [place] [address]  Glucose, capillary     Status: Abnormal   Collection Time: 10/31/21  8:29 AM  Result Value Ref Range   Glucose-Capillary 248 (H) 70 - 99 mg/dL    Comment: Glucose reference range applies only to samples taken after fasting for at least 8 hours.    MR KNEE LEFT WO CONTRAST  Result Date: 10/30/2021 CLINICAL DATA:  Right knee pain. EXAM: MRI OF  THE LEFT KNEE WITHOUT CONTRAST TECHNIQUE: Multiplanar, multisequence MR imaging of the knee was performed. No intravenous contrast was administered. COMPARISON:  Radiographs 10/28/2021 FINDINGS: Examination is extremely limited. There are 2 series other than the scout sequence all of which demonstrate significant motion artifact. There is a small to moderate-sized knee joint effusion. I do not see any grossly abnormal T2 signal intensity to suggest septic arthritis or osteomyelitis. There is diffuse subcutaneous soft tissue swelling/edema/fluid suggesting cellulitis there is a small fluid collection superficial to the lower lateral retinaculum which measures 19 mm and could be a small abscess. Suspect diffuse myofasciitis. IMPRESSION: 1. Very limited examination due to motion artifact. 2. Diffuse subcutaneous soft tissue swelling/edema/fluid suggesting cellulitis. Small fluid collection near the lateral retinaculum could be a small subcutaneous abscess. 3. Suspect mild myofasciitis without definite findings for pyomyositis. 4. Small to moderate-sized knee joint effusion. 5. No grossly abnormal T2 signal intensity to suggest septic arthritis or osteomyelitis.  Electronically Signed   By: Marijo Sanes M.D.   On: 10/30/2021 14:02    Review of Systems  Constitutional:  Negative for chills, diaphoresis and fever.  HENT:  Negative for ear discharge, ear pain, hearing loss and tinnitus.   Eyes:  Negative for photophobia and pain.  Respiratory:  Negative for cough and shortness of breath.   Cardiovascular:  Negative for chest pain.  Gastrointestinal:  Negative for abdominal pain, nausea and vomiting.  Genitourinary:  Negative for dysuria, flank pain, frequency and urgency.  Musculoskeletal:  Positive for arthralgias (Left knee, mild, chronic). Negative for back pain, myalgias and neck pain.  Neurological:  Negative for dizziness and headaches.  Hematological:  Does not bruise/bleed easily.  Psychiatric/Behavioral:  The patient is not nervous/anxious.    Blood pressure 126/79, pulse 95, temperature (S) (!) 96.8 F (36 C), temperature source Rectal, resp. rate 17, height '5\' 9"'$  (1.753 m), weight (!) 137.1 kg, SpO2 94 %. Physical Exam Constitutional:      General: She is not in acute distress.    Appearance: She is well-developed. She is not diaphoretic.  HENT:     Head: Normocephalic and atraumatic.  Eyes:     General: No scleral icterus.       Right eye: No discharge.        Left eye: No discharge.     Conjunctiva/sclera: Conjunctivae normal.  Cardiovascular:     Rate and Rhythm: Normal rate and regular rhythm.  Pulmonary:     Effort: Pulmonary effort is normal. No respiratory distress.  Musculoskeletal:     Cervical back: Normal range of motion.     Comments: LLE No traumatic wounds, ecchymosis, or rash  Small superficial ant knee wound, no surrounding erythema, no fluctuance, no TTP. Painless PROM 180 to 90 degrees, minimal pain with AROM same arc.  No knee or ankle effusion  Knee stable to varus/ valgus and anterior/posterior stress  Sens DPN, SPN, TN paresthetic  Motor EHL, ext, flex, evers 5/5  DP 2+, PT 1+, No significant edema   Skin:    General: Skin is warm and dry.  Neurological:     Mental Status: She is alert.  Psychiatric:        Mood and Affect: Mood normal.        Behavior: Behavior normal.     Assessment/Plan: Left knee pain -- Do not see any e/o septic arthritis, bursitis, or abscess. Do not feel arthrocentesis would be of any benefit. Could consider aspiration of possible fluid collection by IR but  without obvious fluctuance, erythema, or tenderness suspect it is artifact.    Lisette Abu, PA-C Orthopedic Surgery [phone] 10/31/2021, 11:57 AM

## 2021-10-31 NOTE — Progress Notes (Signed)
PROGRESS NOTE    Kristine [name]  IDP:[id number] DOB: April 10, 1945 DOA: 10/28/2021 PCP: Prince Solian, MD    Chief Complaint  Patient presents with   Altered Mental Status    Brief Narrative:   76 year old woman who presented to [facility] 8/8 with fatigue and AMS. PMHx significant for HTN, HLD, AF (previously on Eliquis), mild AS, CHF with cardiac amyloidosis (TTR, diagnosed 10/2021), COPD with chronic bronchitis, OSA, T2DM, hypothyroidism, CKD stage IV, GERD, depression/anxiety.   Per chart review/patient's husband, patient returned from [place] scan 8/8 and appeared extremely weak/fatigued and unable to get from the car to the house. Shortly after arriving home, patient c/o chills/being "freezing" and progressively became altered with increasing confusion and tachypnea. Tmax 101F measured at home and Tylenol was given. Of note, patient has had longstanding sores on bilateral feet (slow to heal in the setting of DM) and L knee (occurred s/p hematoma drainage).   On ED arrival, patient was febrile to 101.28F, tachycardic to 125, hypoxic to 70s on RA, hypotensive with BP 91/45 and altered. Labs were notable for WBC 26, H&H/Plt/INR WNL, Na 134, K 2.7, BUN 89/Cr 2.86, Tbili 2.2, LA 2.8, BNP 276, Trop 53. VBG demonstrated pH 7.398, pCO2 50.7. UA was unremarkable and BCx were obtained. CT Head NAICA, +atrophy and chronic microvascular disease. CT Chest/A/P without active CP disease, changes c/w cirrhosis, sigmoid diverticulitis and anasarca. XR of L knee was negative for acute abnormality and XR bilateral feet were obtained (L negative, R ?gouty changes at base of proximal phalanx of 1st digit, no definite osteomyelitis). Broad spectrum antibiotics were initiated and peripheral NE started for pressor support.   Given patient's ongoing hypotension, fever and AMS with concern for septic shock, patient was transferred to [facility] for further evaluation and workup. Patient admistted to ICU 10/29/2021    Assessment  & Plan:   Principal Problem:   Septic shock ([place]) Active Problems:   Pressure injury of skin  Septic shock secondary to be strep bacteremia -Sepsis present on admission, hypotensive requiring pressors, leukocytosis, elevated lactic acid -Blood culture growing group be Streptococcus -ID input greatly appreciated, continue with IV cefazolin -TEE on Monday per cardiology -Orthopedic/plastic surgery consult greatly appreciated, no significant concern of left knee infection at this point -Discussed with MRI, will try to obtain left foot MRI with morphine premedication    Acute metabolic encephalopathy -Due to infection, CT head with no acute findings, ammonia is 38, urine analysis within normal limit -Significantly improved -Now mentation has improved we will resume Cymbalta and Lyrica  Longstanding ulcerations of BLE (feet) L knee wound, nonhealing -underwent drainage of left knee hematoma and placement of drain 3/20203 by Dr. Rae Roam evacuation by Dr. Marla Roe -concern for possible osteomyelitis of feet or septic arthritis or cellulitis of knee -XR without any signs of infection -Patient recently treated with doxy for knee a week ago for possible cellulitis MRI of left knee is poor quality but I have requested the radiologist to look at it to see if any evidence of infection, patient could not tolerate MRI of her feet   Hypokalemia AKI on CKD stage IV P: -repleted K; repeat bmp pending -Trend BMP / urinary output -Replace electrolytes as indicated -Avoid nephrotoxic agents, ensure adequate renal perfusion   Hypoglycemia T2DM Peripheral neuropathy -She is with significant insulin requirement at home, CBG started to increase will resume at a low dose Semglee and insulin sliding scale -Now mentation has improved we will resume Cymbalta and Lyrica   Acute hypoxemic  respiratory failure/insufficiency COPD with chronic bronchitis OSA: no cpap at home P: -continue to  wean O2 for sats >92% -cpap qhs when less altered and safe to use -prn xopenex for wheezing   Atrial fibrillation History of persistent AF. Previously on Eliquis. Hx of amio toxicity in 2017 Management per cardiology, currently on Cardizem drip and heparin GTT  CHF Chronic diastolic CHF Acute on chronic right-sided heart failure Cardiac amyloidosis, TTR Pulm HTN Mild AS HTN HLD Echo 08/2021 with EF 55-60%, no RWMAs, mild LVH, D-shaped septum in diastole c/w severely elevated PASP, reduced RV function. Management per cardiology, on IV Lasix, no ACE/ARB due to poor renal function   GERD - PPI when able to take PO   Gout Resume allopurinol   Hypothyroidism P: - Resume home synthroid when able to take po   Depression Anxiety P: - hold xanax  Morbid obesity Body mass index is 44.63 kg/m.  DVT prophylaxis: Heparin GTT Code Status: Partial Family Communication: D/W husband at bedside Disposition:   Status is: Inpatient    Consultants:  PCCM ID plastic surgery Orthopedic     Subjective:  No significant events overnight, she denies any complaints today  Objective: Vitals:   10/31/21 0338 10/31/21 0830 10/31/21 0900 10/31/21 1234  BP:  127/70 126/79 132/85  Pulse:  95 95 89  Resp: '17 17 17 15  '$ Temp: (S) (!) 96.8 F (36 C)     TempSrc: Rectal     SpO2:      Weight: (!) 137.1 kg     Height:        Intake/Output Summary (Last 24 hours) at 10/31/2021 1447 Last data filed at 10/31/2021 0900 Gross per 24 hour  Intake 596.1 ml  Output 650 ml  Net -53.9 ml   Filed Weights   10/30/21 0554 10/30/21 1446 10/31/21 0338  Weight: 135.2 kg (!) 136.8 kg (!) 137.1 kg    Examination:  Awake Alert, Oriented X 3, No new F.N deficits, Normal affect Symmetrical Chest wall movement, Good air movement bilaterally, CTAB Irregular irregular,No Gallops,Rubs or new Murmurs, No Parasternal Heave +ve B.Sounds, Abd Soft, No tenderness, No rebound - guarding or  rigidity. No Cyanosis, Clubbing or edema, No new Rash or bruise      Data Reviewed: I have personally reviewed following labs and imaging studies  CBC: Recent Labs  Lab 10/28/21 1920 10/28/21 1923 10/29/21 0201 10/29/21 0439 10/30/21 0058 10/31/21 0325  WBC 26.0*  --  40.6* 43.8* 24.3* 15.8*  NEUTROABS 24.0*  --  37.3*  --   --   --   HGB 11.8* 14.6 9.3* 10.0* 10.7* 10.7*  HCT 36.6 43.0 29.0* 30.0* 32.4* 32.7*  MCV 88.8  --  89.5 88.0 87.6 87.4  PLT 192  --  138* 142* 141* 142*    Basic Metabolic Panel: Recent Labs  Lab 10/28/21 1944 10/29/21 0439 10/29/21 1009 10/29/21 1925 10/30/21 0058 10/31/21 0325  NA  --  134* 134* 132* 132* 130*  K  --  3.0* 3.1* 4.1 4.2 3.4*  CL  --  93* 95* 93* 93* 93*  CO2  --  '27 26 22 26 23  '$ GLUCOSE  --  82 94 145* 200* 240*  BUN  --  85* 86* 88* 90* 101*  CREATININE  --  2.87* 2.91* 2.80* 2.98* 2.82*  CALCIUM  --  8.8* 8.9 9.0 9.2 9.1  MG 2.4 2.3  --   --   --   --   PHOS  --  4.8*  --   --   --   --     GFR: Estimated Creatinine Clearance: 26.1 mL/min (A) (by C-G formula based on SCr of 2.82 mg/dL (H)).  Liver Function Tests: Recent Labs  Lab 10/28/21 1920 10/29/21 0439  AST 21 16  ALT 9 9  ALKPHOS 110 76  BILITOT 2.2* 2.1*  PROT 7.8 5.7*  ALBUMIN 4.0 2.9*    CBG: Recent Labs  Lab 10/30/21 1229 10/30/21 1641 10/30/21 2131 10/31/21 0829 10/31/21 1218  GLUCAP 279* 297* 284* 248* 249*     Recent Results (from the past 240 hour(s))  SARS Coronavirus 2 by RT PCR (hospital order, performed in [facility] hospital lab) *cepheid single result test* Anterior Nasal Swab     Status: None   Collection Time: 10/28/21  6:50 PM   Specimen: Anterior Nasal Swab  Result Value Ref Range Status   SARS Coronavirus 2 by RT PCR NEGATIVE NEGATIVE Final    Comment: (NOTE) SARS-CoV-2 target nucleic acids are NOT DETECTED.  The SARS-CoV-2 RNA is generally detectable in upper and lower respiratory specimens during the acute phase  of infection. The lowest concentration of SARS-CoV-2 viral copies this assay can detect is 250 copies / mL. A negative result does not preclude SARS-CoV-2 infection and should not be used as the sole basis for treatment or other patient management decisions.  A negative result may occur with improper specimen collection / handling, submission of specimen other than nasopharyngeal swab, presence of viral mutation(s) within the areas targeted by this assay, and inadequate number of viral copies (<250 copies / mL). A negative result must be combined with clinical observations, patient history, and epidemiological information.  Fact Sheet for Patients:   https://www.patel.info/  Fact Sheet for Healthcare Providers: https://hall.com/  This test is not yet approved or  cleared by the [place] FDA and has been authorized for detection and/or diagnosis of SARS-CoV-2 by FDA under an Emergency Use Authorization (EUA).  This EUA will remain in effect (meaning this test can be used) for the duration of the COVID-19 declaration under Section 564(b)(1) of the Act, 21 U.S.C. section 360bbb-3(b)(1), unless the authorization is terminated or revoked sooner.  Performed at [facility], [address] [place], [place] [address]   Culture, blood (Routine x 2)     Status: Abnormal   Collection Time: 10/28/21  7:20 PM   Specimen: Left Antecubital; Blood  Result Value Ref Range Status   Specimen Description   Final    LEFT ANTECUBITAL BLOOD Performed at [facility], [place] [address], [place], [place] [address]    Special Requests   Final    BOTTLES DRAWN AEROBIC AND ANAEROBIC Blood Culture adequate volume Performed at [facility], [address] [place], [place] [address]    Culture  Setup Time   Final    GRAM POSITIVE COCCI IN BOTH AEROBIC AND ANAEROBIC BOTTLES CRITICAL RESULT CALLED TO, READ BACK BY AND VERIFIED WITH:  PHARMD A. PAYTES [id number] '@1039'$  FH    Culture (A)  Final    GROUP B STREP(S.AGALACTIAE)ISOLATED SUSCEPTIBILITIES PERFORMED ON PREVIOUS CULTURE WITHIN THE LAST 5 DAYS. Performed at [facility], [place] [address], [place], [place] [address]    Report Status 10/31/2021 FINAL  Final  Culture, blood (Routine x 2)     Status: Abnormal   Collection Time: 10/28/21  7:25 PM   Specimen: BLOOD LEFT ARM  Result Value Ref Range Status  Specimen Description   Final    BLOOD LEFT ARM Performed at [facility], [address] [place], [place] [address]    Special Requests   Final    BOTTLES DRAWN AEROBIC ONLY Blood Culture results may not be optimal due to an inadequate volume of blood received in culture bottles Performed at [facility], [address] [place], [place] [address]    Culture  Setup Time   Final    GRAM POSITIVE COCCI IN CHAINS BOTTLES DRAWN AEROBIC ONLY CRITICAL RESULT CALLED TO, READ BACK BY AND VERIFIED WITH: PHARMD A. PAYTES [id number] '@1039'$  FH Performed at [facility] [address] [place], [place] [address]    Culture GROUP B STREP(S.AGALACTIAE)ISOLATED (A)  Final   Report Status 10/31/2021 FINAL  Final   Organism ID, Bacteria GROUP B STREP(S.AGALACTIAE)ISOLATED  Final      Susceptibility   Group b strep(s.agalactiae)isolated - MIC*    CLINDAMYCIN >=1 RESISTANT Resistant     AMPICILLIN <=0.25 SENSITIVE Sensitive     ERYTHROMYCIN >=8 RESISTANT Resistant     VANCOMYCIN 0.5 SENSITIVE Sensitive     CEFTRIAXONE <=0.12 SENSITIVE Sensitive     LEVOFLOXACIN 0.5 SENSITIVE Sensitive     PENICILLIN Value in next row Sensitive      SENSITIVE0.06    * GROUP B STREP(S.AGALACTIAE)ISOLATED  Blood Culture ID Panel (Reflexed)     Status: Abnormal   Collection Time: 10/28/21  7:25 PM  Result Value Ref Range Status   Enterococcus faecalis NOT DETECTED NOT DETECTED Final   Enterococcus Faecium NOT DETECTED NOT DETECTED Final   Listeria monocytogenes NOT  DETECTED NOT DETECTED Final   Staphylococcus species NOT DETECTED NOT DETECTED Final   Staphylococcus aureus (BCID) NOT DETECTED NOT DETECTED Final   Staphylococcus epidermidis NOT DETECTED NOT DETECTED Final   Staphylococcus lugdunensis NOT DETECTED NOT DETECTED Final   Streptococcus species DETECTED (A) NOT DETECTED Final    Comment: CRITICAL RESULT CALLED TO, READ BACK BY AND VERIFIED WITH: PHARMD A. PAYTES [id number] '@1039'$  FH    Streptococcus agalactiae DETECTED (A) NOT DETECTED Final    Comment: CRITICAL RESULT CALLED TO, READ BACK BY AND VERIFIED WITH: PHARMD A. PAYTES [id number] '@1039'$  FH    Streptococcus pneumoniae NOT DETECTED NOT DETECTED Final   Streptococcus pyogenes NOT DETECTED NOT DETECTED Final   A.calcoaceticus-baumannii NOT DETECTED NOT DETECTED Final   Bacteroides fragilis NOT DETECTED NOT DETECTED Final   Enterobacterales NOT DETECTED NOT DETECTED Final   Enterobacter cloacae complex NOT DETECTED NOT DETECTED Final   Escherichia coli NOT DETECTED NOT DETECTED Final   Klebsiella aerogenes NOT DETECTED NOT DETECTED Final   Klebsiella oxytoca NOT DETECTED NOT DETECTED Final   Klebsiella pneumoniae NOT DETECTED NOT DETECTED Final   Proteus species NOT DETECTED NOT DETECTED Final   Salmonella species NOT DETECTED NOT DETECTED Final   Serratia marcescens NOT DETECTED NOT DETECTED Final   Haemophilus influenzae NOT DETECTED NOT DETECTED Final   Neisseria meningitidis NOT DETECTED NOT DETECTED Final   Pseudomonas aeruginosa NOT DETECTED NOT DETECTED Final   Stenotrophomonas maltophilia NOT DETECTED NOT DETECTED Final   Candida albicans NOT DETECTED NOT DETECTED Final   Candida auris NOT DETECTED NOT DETECTED Final   Candida glabrata NOT DETECTED NOT DETECTED Final   Candida krusei NOT DETECTED NOT DETECTED Final   Candida parapsilosis NOT DETECTED NOT DETECTED Final   Candida tropicalis NOT DETECTED NOT DETECTED Final   Cryptococcus neoformans/gattii NOT DETECTED NOT DETECTED  Final    Comment: Performed at [facility], [place] [address] [place], [place] [address]  MRSA Next Gen by PCR, Nasal     Status: None   Collection Time: 10/29/21  1:06 AM   Specimen: Nasal Mucosa; Nasal Swab  Result Value Ref Range Status   MRSA by PCR Next Gen NOT DETECTED NOT DETECTED Final    Comment: (NOTE) The GeneXpert MRSA Assay (FDA approved for NASAL specimens only), is one component of a comprehensive MRSA colonization surveillance program. It is not intended to diagnose MRSA infection nor to guide or monitor treatment for MRSA infections. Test performance is not FDA approved in patients less than 11 years old. Performed at [facility], [place] [address] [place], [place] [address]          Radiology Studies: MR KNEE LEFT WO CONTRAST  Result Date: 10/30/2021 CLINICAL DATA:  Right knee pain. EXAM: MRI OF THE LEFT KNEE WITHOUT CONTRAST TECHNIQUE: Multiplanar, multisequence MR imaging of the knee was performed. No intravenous contrast was administered. COMPARISON:  Radiographs 10/28/2021 FINDINGS: Examination is extremely limited. There are 2 series other than the scout sequence all of which demonstrate significant motion artifact. There is a small to moderate-sized knee joint effusion. I do not see any grossly abnormal T2 signal intensity to suggest septic arthritis or osteomyelitis. There is diffuse subcutaneous soft tissue swelling/edema/fluid suggesting cellulitis there is a small fluid collection superficial to the lower lateral retinaculum which measures 19 mm and could be a small abscess. Suspect diffuse myofasciitis. IMPRESSION: 1. Very limited examination due to motion artifact. 2. Diffuse subcutaneous soft tissue swelling/edema/fluid suggesting cellulitis. Small fluid collection near the lateral retinaculum could be a small subcutaneous abscess. 3. Suspect mild myofasciitis without definite findings for pyomyositis. 4. Small to moderate-sized knee joint effusion.  5. No grossly abnormal T2 signal intensity to suggest septic arthritis or osteomyelitis. Electronically Signed   By: Marijo Sanes M.D.   On: 10/30/2021 14:02        Scheduled Meds:  allopurinol  300 mg Oral q AM   DULoxetine  30 mg Oral Daily   insulin aspart  0-20 Units Subcutaneous TID WC   insulin aspart  0-5 Units Subcutaneous QHS   insulin glargine-yfgn  25 Units Subcutaneous Daily   levothyroxine  88 mcg Oral q morning   metoprolol tartrate  12.5 mg Oral BID   pregabalin  50 mg Oral BID   Vitamin D (Ergocalciferol)  50,000 Units Oral Q7 days   Continuous Infusions:   ceFAZolin (ANCEF) IV 2 g (10/31/21 0908)   furosemide     heparin 1,500 Units/hr (10/31/21 0900)     LOS: 2 days       Phillips Climes, MD [facility]   To contact the attending provider between 7A-7P or the covering provider during after hours 7P-7A, please log into the web site [email or web address] and access using universal [place] password for that web site. If you do not have the password, please call the hospital operator.  10/31/2021, 2:47 PM

## 2021-10-31 NOTE — Care Management Important Message (Signed)
Important Message  Patient Details  Name: Kristine [name] MRN: [id number] Date of Birth: 1945/06/08   Medicare Important Message Given:  Yes     Shelda Altes 10/31/2021, 2:50 PM

## 2021-11-01 DIAGNOSIS — I480 Paroxysmal atrial fibrillation: Secondary | ICD-10-CM

## 2021-11-01 DIAGNOSIS — N184 Chronic kidney disease, stage 4 (severe): Secondary | ICD-10-CM | POA: Diagnosis not present

## 2021-11-01 DIAGNOSIS — A419 Sepsis, unspecified organism: Secondary | ICD-10-CM | POA: Diagnosis not present

## 2021-11-01 DIAGNOSIS — R6521 Severe sepsis with septic shock: Secondary | ICD-10-CM | POA: Diagnosis not present

## 2021-11-01 DIAGNOSIS — G9340 Encephalopathy, unspecified: Secondary | ICD-10-CM | POA: Diagnosis not present

## 2021-11-01 DIAGNOSIS — J9601 Acute respiratory failure with hypoxia: Secondary | ICD-10-CM | POA: Diagnosis not present

## 2021-11-01 DIAGNOSIS — I4891 Unspecified atrial fibrillation: Secondary | ICD-10-CM | POA: Diagnosis not present

## 2021-11-01 LAB — CBC
HCT: 30.9 % — ABNORMAL LOW (ref 36.0–46.0)
Hemoglobin: 10.1 g/dL — ABNORMAL LOW (ref 12.0–15.0)
MCH: 28.9 pg (ref 26.0–34.0)
MCHC: 32.7 g/dL (ref 30.0–36.0)
MCV: 88.3 fL (ref 80.0–100.0)
Platelets: 218 10*3/uL (ref 150–400)
RBC: 3.5 MIL/uL — ABNORMAL LOW (ref 3.87–5.11)
RDW: 16.7 % — ABNORMAL HIGH (ref 11.5–15.5)
WBC: 11.8 10*3/uL — ABNORMAL HIGH (ref 4.0–10.5)
nRBC: 0 % (ref 0.0–0.2)

## 2021-11-01 LAB — BASIC METABOLIC PANEL
Anion gap: 14 (ref 5–15)
BUN: 102 mg/dL — ABNORMAL HIGH (ref 8–23)
CO2: 25 mmol/L (ref 22–32)
Calcium: 9.4 mg/dL (ref 8.9–10.3)
Chloride: 95 mmol/L — ABNORMAL LOW (ref 98–111)
Creatinine, Ser: 2.66 mg/dL — ABNORMAL HIGH (ref 0.44–1.00)
GFR, Estimated: 18 mL/min — ABNORMAL LOW (ref >60)
Glucose, Bld: 115 mg/dL — ABNORMAL HIGH (ref 70–99)
Potassium: 3.4 mmol/L — ABNORMAL LOW (ref 3.5–5.1)
Sodium: 134 mmol/L — ABNORMAL LOW (ref 135–145)

## 2021-11-01 LAB — GLUCOSE, CAPILLARY
Glucose-Capillary: 159 mg/dL — ABNORMAL HIGH (ref 70–99)
Glucose-Capillary: 196 mg/dL — ABNORMAL HIGH (ref 70–99)
Glucose-Capillary: 185 mg/dL — ABNORMAL HIGH (ref 70–99)
Glucose-Capillary: 211 mg/dL — ABNORMAL HIGH (ref 70–99)

## 2021-11-01 LAB — HEPARIN LEVEL (UNFRACTIONATED): Heparin Unfractionated: 0.32 IU/mL (ref 0.30–0.70)

## 2021-11-01 MED ORDER — METOLAZONE 2.5 MG PO TABS
2.5000 mg | ORAL_TABLET | Freq: Once | ORAL | Status: AC
  Administered 2021-11-01: 2.5 mg via ORAL
  Filled 2021-11-01: qty 1

## 2021-11-01 MED ORDER — POTASSIUM CHLORIDE CRYS ER 20 MEQ PO TBCR
40.0000 meq | EXTENDED_RELEASE_TABLET | Freq: Four times a day (QID) | ORAL | Status: AC
  Administered 2021-11-01 (×2): 40 meq via ORAL
  Filled 2021-11-01 (×2): qty 2

## 2021-11-01 NOTE — Progress Notes (Signed)
Physical Therapy Treatment Patient Details Name: Kristine [name] MRN: [id number] DOB: 1945-11-22 Today's Date: 11/01/2021   History of Present Illness 76 year old woman who presented to [facility] 8/8 with fatigue and AMS. PMHx significant for HTN, HLD, AF (previously on Eliquis), mild AS, CHF with cardiac amyloidosis (TTR, diagnosed 10/2021), COPD with chronic bronchitis, OSA, T2DM, hypothyroidism, CKD stage IV, GERD, depression/anxiety.  Recent hospitalization 6/23 with [facility] and PT.    PT Comments    Pt able to get up on the scale, to the [facility], and recliner chair with one person assist.  Performed seated exercises, and standing exercises (to gospel music).  She is ready for gait progression, but would likely be safer with second person following with the recliner chair.  PT will continue to follow acutely for safe mobility progression    Recommendations for follow up therapy are one component of a multi-disciplinary discharge planning process, led by the attending physician.  Recommendations may be updated based on patient status, additional functional criteria and insurance authorization.  Follow Up Recommendations  Home health PT     Assistance Recommended at Discharge Frequent or constant Supervision/Assistance  Patient can return home with the following A little help with walking and/or transfers;A little help with bathing/dressing/bathroom;Help with stairs or ramp for entrance;Assist for transportation   Equipment Recommendations  None recommended by PT (new RW in her room)    Recommendations for Other Services       Precautions / Restrictions Precautions Precautions: Fall Precaution Comments: open area to L plantar surface near great toe.     Mobility  Bed Mobility Overal bed mobility: Needs Assistance Bed Mobility: Supine to Sit   Sidelying to sit: Min assist, HOB elevated       General bed mobility comments: Min hand held assist to come up to sitting EOB.     Transfers Overall transfer level: Needs assistance Equipment used: Rolling walker (2 wheels) Transfers: Sit to/from Stand Sit to Stand: Min assist, From elevated surface           General transfer comment: Min assist to come to standing x3 from bed, recliner    Ambulation/Gait               General Gait Details: Pt with significant difficulty picking up her feet, small room with not great space to move with walker and chair.  would need second person to be safe to progress gait away from recliner chair.   Stairs             Wheelchair Mobility    Modified Rankin (Stroke Patients Only)       Balance Overall balance assessment: Needs assistance Sitting-balance support: Feet supported, Bilateral upper extremity supported Sitting balance-Leahy Scale: Fair Sitting balance - Comments: close supervision EOB                                    Cognition Arousal/Alertness: Awake/alert Behavior During Therapy: WFL for tasks assessed/performed Overall Cognitive Status: Impaired/Different from baseline Area of Impairment: Problem solving                             Problem Solving: Slow processing General Comments: Pt seems better than she was described yesterday, however, still a bit slow to process.  oriented to people (family) in room.  Singing along to [employer].  Exercises General Exercises - Lower Extremity Ankle Circles/Pumps: AROM, Both, 10 reps Long Arc Quad: AROM, Both, 10 reps Hip Flexion/Marching: AROM, Both, 10 reps    General Comments General comments (skin integrity, edema, etc.): Stood for 2-3 mins dancing to [employer], sat for 2-3 mins and stood again dancing to [employer].  i tried to get her to march in place and she had difficulty picking her feet up even with the support of the RW.      Pertinent Vitals/Pain Pain Assessment Pain Assessment: Faces Pain Score: 0-No pain    Home Living                           Prior Function            PT Goals (current goals can now be found in the care plan section) Progress towards PT goals: Progressing toward goals    Frequency    Min 3X/week      PT Plan Current plan remains appropriate    Co-evaluation              AM-PAC PT "6 Clicks" Mobility   Outcome Measure  Help needed turning from your back to your side while in a flat bed without using bedrails?: A Little Help needed moving from lying on your back to sitting on the side of a flat bed without using bedrails?: A Little Help needed moving to and from a bed to a chair (including a wheelchair)?: A Little Help needed standing up from a chair using your arms (e.g., wheelchair or bedside chair)?: A Little Help needed to walk in hospital room?: A Little Help needed climbing 3-5 steps with a railing? : Total 6 Click Score: 16    End of Session Equipment Utilized During Treatment: Gait belt Activity Tolerance: Patient limited by fatigue Patient left: in chair;with call bell/phone within reach;with family/visitor present Nurse Communication: Mobility status PT Visit Diagnosis: Other abnormalities of gait and mobility (R26.89);Muscle weakness (generalized) (M62.81)     Time: 0762-2633 PT Time Calculation (min) (ACUTE ONLY): 73 min  Charges:  $Therapeutic Exercise: 23-37 mins $Therapeutic Activity: 38-52 mins                     Verdene Lennert, PT, DPT  Acute Rehabilitation Secure chat is best for contact #[phone] office

## 2021-11-01 NOTE — Progress Notes (Signed)
ANTICOAGULATION CONSULT NOTE - Follow Up Consult  Pharmacy Consult for Heparin Indication: atrial fibrillation  Allergies  Allergen Reactions   Albuterol Other (See Comments)    REACTION: Tachycardia- AFib   Epinephrine Other (See Comments)    Increases heart rate per patient.    Penicillins Other (See Comments)    REACTION: flushing' \\T'$ \ hot  Did it involve swelling of the face/tongue/throat, SOB, or low BP? No Did it involve sudden or severe rash/hives, skin peeling, or any reaction on the inside of your mouth or nose? No Did you need to seek medical attention at a hospital or doctor's office? No When did it last happen?    20 years ago   If all above answers are "NO", may proceed with cephalosporin use.   Ceftriaxone Other (See Comments)    Sweats   Citalopram Hydrobromide Other (See Comments)    Prolonged QT prolongation   Exenatide Other (See Comments)    Unknown reaction   Ketorolac Tromethamine Hives and Swelling    swelling in hands   Tizanidine Other (See Comments)    lethargic   Torsemide Swelling   Zoledronic Acid Other (See Comments)    Pt was hospitalized due to medication   Bee Venom Other (See Comments)    "makes me nervous"   Ivp Dye [Iodinated Contrast Media] Other (See Comments)    flushing   Keflex [Cephalexin] Other (See Comments)    Makes her feel warm; she was told never to take it.    Patient Measurements: Height: '5\' 9"'$  (175.3 cm) Weight: (!) 137.1 kg (302 lb 4 oz) IBW/kg (Calculated) : 66.2 Heparin Dosing Weight: 99 kg  Vital Signs: Temp: 97.9 F (36.6 C) (08/12 0334) Temp Source: Oral (08/12 0334) BP: 123/65 (08/12 0334) Pulse Rate: 95 (08/12 0334)  Labs: Recent Labs    10/30/21 0058 10/30/21 1511 10/31/21 0325 11/01/21 0313  HGB 10.7*  --  10.7* 10.1*  HCT 32.4*  --  32.7* 30.9*  PLT 141*  --  142* 218  HEPARINUNFRC 0.30 0.42 0.31 0.32  CREATININE 2.98*  --  2.82* 2.66*     Estimated Creatinine Clearance: 27.7 mL/min (A) (by  C-G formula based on SCr of 2.66 mg/dL (H)).  Assessment: 76 yo F presents with AMS/sepsis. Pt on Xarelto '15mg'$  in the past but changed to apixaban 2.'5mg'$  po BID back in July - stopped ~1 week PTA due to severe hemoptysis and knee hematoma/bleeding. Pharmacy consulted to start heparin for afib on 8/9 pm per cards recs. May re-challenge with DOAC if Hgb remains stable. Apixaban should not still be affecting heparin levels. Heparin level today is therapeutic at 0.32, on 1500 units/hr units/hr. Hgb decreased slightly from 10.7 to 10.1, plt increased to 218. No line issues or signs/symptoms of bleeding per RN.  Goal of Therapy:  Heparin level 0.3-0.7 units/ml Monitor platelets by anticoagulation protocol: Yes   Plan:  Continue heparin drip at 1500 units/hr Daily heparin level and CBC. Monitor for signs/symptoms of bleeding. Follow up anticoagulation plans per cardiology.  Jeneen Rinks, RPh 07/08/4079,4:48 AM

## 2021-11-01 NOTE — Progress Notes (Signed)
PROGRESS NOTE    Kristine [name]  ZTI:[id number] DOB: 16-May-1945 DOA: 10/28/2021 PCP: Prince Solian, MD    Chief Complaint  Patient presents with   Altered Mental Status    Brief Narrative:   76 year old woman who presented to [facility] 8/8 with fatigue and AMS. PMHx significant for HTN, HLD, AF (previously on Eliquis), mild AS, CHF with cardiac amyloidosis (TTR, diagnosed 10/2021), COPD with chronic bronchitis, OSA, T2DM, hypothyroidism, CKD stage IV, GERD, depression/anxiety.   Per chart review/patient's husband, patient returned from [place] scan 8/8 and appeared extremely weak/fatigued and unable to get from the car to the house. Shortly after arriving home, patient c/o chills/being "freezing" and progressively became altered with increasing confusion and tachypnea. Tmax 101F measured at home and Tylenol was given. Of note, patient has had longstanding sores on bilateral feet (slow to heal in the setting of DM) and L knee (occurred s/p hematoma drainage).   On ED arrival, patient was febrile to 101.42F, tachycardic to 125, hypoxic to 70s on RA, hypotensive with BP 91/45 and altered. Labs were notable for WBC 26, H&H/Plt/INR WNL, Na 134, K 2.7, BUN 89/Cr 2.86, Tbili 2.2, LA 2.8, BNP 276, Trop 53. VBG demonstrated pH 7.398, pCO2 50.7. UA was unremarkable and BCx were obtained. CT Head NAICA, +atrophy and chronic microvascular disease. CT Chest/A/P without active CP disease, changes c/w cirrhosis, sigmoid diverticulitis and anasarca. XR of L knee was negative for acute abnormality and XR bilateral feet were obtained (L negative, R ?gouty changes at base of proximal phalanx of 1st digit, no definite osteomyelitis). Broad spectrum antibiotics were initiated and peripheral NE started for pressor support.   Given patient's ongoing hypotension, fever and AMS with concern for septic shock, patient was transferred to [facility] for further evaluation and workup. Patient admistted to ICU 10/29/2021    Assessment  & Plan:   Principal Problem:   Septic shock ([place]) Active Problems:   Diabetes mellitus, type 2 (HCC)   CKD (chronic kidney disease) stage 4, GFR 15-29 ml/min (HCC)   Essential hypertension   Pulmonary hypertension (HCC)   Persistent atrial fibrillation (HCC)   Paroxysmal atrial fibrillation (HCC)   Pressure injury of skin  Septic shock secondary to be strep bacteremia -Sepsis present on admission, hypotensive requiring pressors, leukocytosis, elevated lactic acid -Blood culture growing group be Streptococcus -ID input greatly appreciated, continue with IV cefazolin -TEE on Monday per cardiology -Orthopedic/plastic surgery consult greatly appreciated, no significant concern of left knee infection at this point -Fluid collection in left knee status post aspiration by IR, continue to follow on cultures  Atrial fibrillation History of persistent AF. Previously on Eliquis. Hx of amio toxicity in 2017 Management per cardiology, quiring Cardizem drip initially,  Acute on chronic right-sided heart failure Chronic diastolic CHF Acute on chronic right-sided heart failure Cardiac amyloidosis, TTR Pulm HTN Mild AS HTN HLD Echo 08/2021 with EF 55-60%, no RWMAs, mild LVH, D-shaped septum in diastole c/w severely elevated PASP, reduced RV function. -Agement per cardiology, she is on IV diuresis, 100 mg IV twice daily, she will receive 1 dose of metolazone today. -No ACE/ARB/ARNI, due to poor renal function   Hypokalemia -Potassium 3.4, will replete especially she is on IV diuresis  Acute metabolic encephalopathy -Due to infection, CT head with no acute findings, ammonia is 38, urine analysis within normal limit -Significantly improved -Now mentation has improved we will resume Cymbalta and Lyrica  Longstanding ulcerations of BLE (feet) L knee wound, nonhealing -underwent drainage of left knee hematoma  and placement of drain 3/20203 by Dr. Rae Roam evacuation by Dr.  Marla Roe -concern for possible osteomyelitis of feet or septic arthritis or cellulitis of knee -XR without any signs of infection -MRI with no evidence of infection  Hypokalemia AKI on CKD stage IV P: -repleted K; repeat bmp pending -Trend BMP / urinary output -Replace electrolytes as indicated -Avoid nephrotoxic agents, ensure adequate renal perfusion   Hypoglycemia T2DM Peripheral neuropathy -She is with significant insulin requirement at home, CBG started to increase will resume at a low dose Semglee and insulin sliding scale -on Cymbalta and Lyrica   Acute hypoxemic respiratory failure/insufficiency COPD with chronic bronchitis OSA: no cpap at home P: -continue to wean O2 for sats >92% -cpap qhs when less altered and safe to use -prn xopenex for wheezing   Atrial fibrillation History of persistent AF. Previously on Eliquis. Hx of amio toxicity in 2017 Management per cardiology, currently on Cardizem drip and heparin GTT  CHF Chronic diastolic CHF Acute on chronic right-sided heart failure Cardiac amyloidosis, TTR Pulm HTN Mild AS HTN HLD Echo 08/2021 with EF 55-60%, no RWMAs, mild LVH, D-shaped septum in diastole c/w severely elevated PASP, reduced RV function. Management per cardiology, on IV Lasix, no ACE/ARB due to poor renal function   GERD - PPI when able to take PO   Gout Resume allopurinol   Hypothyroidism - Resume home synthroid when able to take po   Depression Anxiety P: - hold xanax  Morbid obesity Body mass index is 44.63 kg/m.  DVT prophylaxis: Heparin GTT Code Status: Partial Family Communication: D/W husband at bedside Disposition:   Status is: Inpatient    Consultants:  PCCM ID plastic surgery Orthopedic     Subjective:  No significant events overnight, she denies any complaints today  Objective: Vitals:   10/31/21 2019 11/01/21 0000 11/01/21 0334 11/01/21 0825  BP: 113/87 113/82 123/65 115/65  Pulse: 82 88 95 95   Resp: '14 13 11 20  '$ Temp: (!) 95.4 F (35.2 C) 97.6 F (36.4 C) 97.9 F (36.6 C) 97.8 F (36.6 C)  TempSrc: Rectal Oral Oral Oral  SpO2: 100% 97% 97% 96%  Weight:      Height:        Intake/Output Summary (Last 24 hours) at 11/01/2021 1305 Last data filed at 11/01/2021 1300 Gross per 24 hour  Intake 389.9 ml  Output 900 ml  Net -510.1 ml   Filed Weights   10/30/21 0554 10/30/21 1446 10/31/21 0338  Weight: 135.2 kg (!) 136.8 kg (!) 137.1 kg    Examination:  Awake Alert, Oriented X 3, No new F.N deficits, Normal affect Symmetrical Chest wall movement, Good air movement bilaterally, CTAB Irregular irregular,No Gallops,Rubs or new Murmurs, No Parasternal Heave +ve B.Sounds, Abd Soft, No tenderness, No rebound - guarding or rigidity. No Cyanosis, Clubbing or edema, No new Rash or bruise      Data Reviewed: I have personally reviewed following labs and imaging studies  CBC: Recent Labs  Lab 10/28/21 1920 10/28/21 1923 10/29/21 0201 10/29/21 0439 10/30/21 0058 10/31/21 0325 11/01/21 0313  WBC 26.0*  --  40.6* 43.8* 24.3* 15.8* 11.8*  NEUTROABS 24.0*  --  37.3*  --   --   --   --   HGB 11.8*   < > 9.3* 10.0* 10.7* 10.7* 10.1*  HCT 36.6   < > 29.0* 30.0* 32.4* 32.7* 30.9*  MCV 88.8  --  89.5 88.0 87.6 87.4 88.3  PLT 192  --  138* 142*  141* 142* 218   < > = values in this interval not displayed.    Basic Metabolic Panel: Recent Labs  Lab 10/28/21 1944 10/29/21 0439 10/29/21 1009 10/29/21 1925 10/30/21 0058 10/31/21 0325 11/01/21 0313  NA  --  134* 134* 132* 132* 130* 134*  K  --  3.0* 3.1* 4.1 4.2 3.4* 3.4*  CL  --  93* 95* 93* 93* 93* 95*  CO2  --  '27 26 22 26 23 25  '$ GLUCOSE  --  82 94 145* 200* 240* 115*  BUN  --  85* 86* 88* 90* 101* 102*  CREATININE  --  2.87* 2.91* 2.80* 2.98* 2.82* 2.66*  CALCIUM  --  8.8* 8.9 9.0 9.2 9.1 9.4  MG 2.4 2.3  --   --   --   --   --   PHOS  --  4.8*  --   --   --   --   --     GFR: Estimated Creatinine Clearance:  27.7 mL/min (A) (by C-G formula based on SCr of 2.66 mg/dL (H)).  Liver Function Tests: Recent Labs  Lab 10/28/21 1920 10/29/21 0439  AST 21 16  ALT 9 9  ALKPHOS 110 76  BILITOT 2.2* 2.1*  PROT 7.8 5.7*  ALBUMIN 4.0 2.9*    CBG: Recent Labs  Lab 10/31/21 0829 10/31/21 1218 10/31/21 1952 10/31/21 2219 11/01/21 0827  GLUCAP 248* 249* 133* 142* 159*     Recent Results (from the past 240 hour(s))  SARS Coronavirus 2 by RT PCR (hospital order, performed in [facility] hospital lab) *cepheid single result test* Anterior Nasal Swab     Status: None   Collection Time: 10/28/21  6:50 PM   Specimen: Anterior Nasal Swab  Result Value Ref Range Status   SARS Coronavirus 2 by RT PCR NEGATIVE NEGATIVE Final    Comment: (NOTE) SARS-CoV-2 target nucleic acids are NOT DETECTED.  The SARS-CoV-2 RNA is generally detectable in upper and lower respiratory specimens during the acute phase of infection. The lowest concentration of SARS-CoV-2 viral copies this assay can detect is 250 copies / mL. A negative result does not preclude SARS-CoV-2 infection and should not be used as the sole basis for treatment or other patient management decisions.  A negative result may occur with improper specimen collection / handling, submission of specimen other than nasopharyngeal swab, presence of viral mutation(s) within the areas targeted by this assay, and inadequate number of viral copies (<250 copies / mL). A negative result must be combined with clinical observations, patient history, and epidemiological information.  Fact Sheet for Patients:   https://www.patel.info/  Fact Sheet for Healthcare Providers: https://hall.com/  This test is not yet approved or  cleared by the [place] FDA and has been authorized for detection and/or diagnosis of SARS-CoV-2 by FDA under an Emergency Use Authorization (EUA).  This EUA will remain in effect (meaning  this test can be used) for the duration of the COVID-19 declaration under Section 564(b)(1) of the Act, 21 U.S.C. section 360bbb-3(b)(1), unless the authorization is terminated or revoked sooner.  Performed at [facility], [address] [place], [place] [address]   Culture, blood (Routine x 2)     Status: Abnormal   Collection Time: 10/28/21  7:20 PM   Specimen: Left Antecubital; Blood  Result Value Ref Range Status   Specimen Description   Final    LEFT ANTECUBITAL BLOOD Performed at [facility], [place]  [address], [place], [place] [address]    Special Requests   Final    BOTTLES DRAWN AEROBIC AND ANAEROBIC Blood Culture adequate volume Performed at [facility], [address] [place], [place] [address]    Culture  Setup Time   Final    GRAM POSITIVE COCCI IN BOTH AEROBIC AND ANAEROBIC BOTTLES CRITICAL RESULT CALLED TO, READ BACK BY AND VERIFIED WITH: PHARMD A. PAYTES [id number] '@1039'$  FH    Culture (A)  Final    GROUP B STREP(S.AGALACTIAE)ISOLATED SUSCEPTIBILITIES PERFORMED ON PREVIOUS CULTURE WITHIN THE LAST 5 DAYS. Performed at [facility], [place] [address] [place], [place] [address]    Report Status 10/31/2021 FINAL  Final  Culture, blood (Routine x 2)     Status: Abnormal   Collection Time: 10/28/21  7:25 PM   Specimen: BLOOD LEFT ARM  Result Value Ref Range Status   Specimen Description   Final    BLOOD LEFT ARM Performed at [facility], [address] [place], [place] [address]    Special Requests   Final    BOTTLES DRAWN AEROBIC ONLY Blood Culture results may not be optimal due to an inadequate volume of blood received in culture bottles Performed at [facility], [address] [place], [place] [address]    Culture  Setup Time   Final    GRAM POSITIVE COCCI IN CHAINS BOTTLES DRAWN AEROBIC ONLY CRITICAL RESULT CALLED TO, READ BACK BY AND VERIFIED WITH: PHARMD A. PAYTES [id number] '@1039'$  FH Performed at [facility] [address] [place], [place] [address]    Culture GROUP B STREP(S.AGALACTIAE)ISOLATED (A)  Final   Report Status 10/31/2021 FINAL  Final   Organism ID, Bacteria GROUP B STREP(S.AGALACTIAE)ISOLATED  Final      Susceptibility   Group b strep(s.agalactiae)isolated - MIC*    CLINDAMYCIN >=1 RESISTANT Resistant     AMPICILLIN <=0.25 SENSITIVE Sensitive     ERYTHROMYCIN >=8 RESISTANT Resistant     VANCOMYCIN 0.5 SENSITIVE Sensitive     CEFTRIAXONE <=0.12 SENSITIVE Sensitive     LEVOFLOXACIN 0.5 SENSITIVE Sensitive     PENICILLIN Value in next row Sensitive      SENSITIVE0.06    * GROUP B STREP(S.AGALACTIAE)ISOLATED  Blood Culture ID Panel (Reflexed)     Status: Abnormal   Collection Time: 10/28/21  7:25 PM  Result Value Ref Range Status   Enterococcus faecalis NOT DETECTED NOT DETECTED Final   Enterococcus Faecium NOT DETECTED NOT DETECTED Final   Listeria monocytogenes NOT DETECTED NOT DETECTED Final   Staphylococcus species NOT DETECTED NOT DETECTED Final   Staphylococcus aureus (BCID) NOT DETECTED NOT DETECTED Final   Staphylococcus epidermidis NOT DETECTED NOT DETECTED Final   Staphylococcus lugdunensis NOT DETECTED NOT DETECTED Final   Streptococcus species DETECTED (A) NOT DETECTED Final    Comment: CRITICAL RESULT CALLED TO, READ BACK BY AND VERIFIED WITH: PHARMD A. PAYTES [id number] '@1039'$  FH    Streptococcus agalactiae DETECTED (A) NOT DETECTED Final    Comment: CRITICAL RESULT CALLED TO, READ BACK BY AND VERIFIED WITH: PHARMD A. PAYTES [id number] '@1039'$  FH    Streptococcus pneumoniae NOT DETECTED NOT DETECTED Final   Streptococcus pyogenes NOT DETECTED NOT DETECTED Final   A.calcoaceticus-baumannii NOT DETECTED NOT DETECTED Final   Bacteroides fragilis NOT DETECTED NOT DETECTED Final   Enterobacterales NOT DETECTED NOT DETECTED Final   Enterobacter cloacae complex NOT DETECTED NOT DETECTED Final   Escherichia coli NOT DETECTED NOT  DETECTED Final   Klebsiella  aerogenes NOT DETECTED NOT DETECTED Final   Klebsiella oxytoca NOT DETECTED NOT DETECTED Final   Klebsiella pneumoniae NOT DETECTED NOT DETECTED Final   Proteus species NOT DETECTED NOT DETECTED Final   Salmonella species NOT DETECTED NOT DETECTED Final   Serratia marcescens NOT DETECTED NOT DETECTED Final   Haemophilus influenzae NOT DETECTED NOT DETECTED Final   Neisseria meningitidis NOT DETECTED NOT DETECTED Final   Pseudomonas aeruginosa NOT DETECTED NOT DETECTED Final   Stenotrophomonas maltophilia NOT DETECTED NOT DETECTED Final   Candida albicans NOT DETECTED NOT DETECTED Final   Candida auris NOT DETECTED NOT DETECTED Final   Candida glabrata NOT DETECTED NOT DETECTED Final   Candida krusei NOT DETECTED NOT DETECTED Final   Candida parapsilosis NOT DETECTED NOT DETECTED Final   Candida tropicalis NOT DETECTED NOT DETECTED Final   Cryptococcus neoformans/gattii NOT DETECTED NOT DETECTED Final    Comment: Performed at [facility], [place] [address], [place], [place] [address]  MRSA Next Gen by PCR, Nasal     Status: None   Collection Time: 10/29/21  1:06 AM   Specimen: Nasal Mucosa; Nasal Swab  Result Value Ref Range Status   MRSA by PCR Next Gen NOT DETECTED NOT DETECTED Final    Comment: (NOTE) The GeneXpert MRSA Assay (FDA approved for NASAL specimens only), is one component of a comprehensive MRSA colonization surveillance program. It is not intended to diagnose MRSA infection nor to guide or monitor treatment for MRSA infections. Test performance is not FDA approved in patients less than 51 years old. Performed at [facility], [place] [address] [place], [place] [address]   Culture, blood (Routine X 2) w Reflex to ID Panel     Status: None (Preliminary result)   Collection Time: 10/31/21 11:44 AM   Specimen: BLOOD  Result Value Ref Range Status   Specimen Description BLOOD BLOOD RIGHT HAND  Final   Special Requests   Final    BOTTLES DRAWN AEROBIC AND  ANAEROBIC Blood Culture results may not be optimal due to an inadequate volume of blood received in culture bottles   Culture   Final    NO GROWTH < 24 HOURS Performed at [facility], [place] [address], [place], [place] [address]    Report Status PENDING  Incomplete  Culture, blood (Routine X 2) w Reflex to ID Panel     Status: None (Preliminary result)   Collection Time: 10/31/21 11:56 AM   Specimen: BLOOD  Result Value Ref Range Status   Specimen Description BLOOD BLOOD LEFT HAND  Final   Special Requests   Final    BOTTLES DRAWN AEROBIC ONLY Blood Culture adequate volume   Culture   Final    NO GROWTH < 24 HOURS Performed at [facility], [place] [address] [place], [place] [address]    Report Status PENDING  Incomplete  Aerobic/Anaerobic Culture w Gram Stain (surgical/deep wound)     Status: None (Preliminary result)   Collection Time: 10/31/21  5:15 PM   Specimen: Abscess  Result Value Ref Range Status   Specimen Description ABSCESS LEFT KNEE  Final   Special Requests NONE  Final   Gram Stain   Final    NO ORGANISMS SEEN MODERATE WBC PRESENT, PREDOMINANTLY PMN    Culture   Final    NO GROWTH < 12 HOURS Performed at [facility], [place] [address] [place], [place] [address]  Report Status PENDING  Incomplete         Radiology Studies: IR US Guide Bx Asp/Drain  Result Date: 11/01/2021 INDICATION: Bacteremia, small fluid collection in the lateral aspect of the knee EXAM: Ultrasound aspiration MEDICATIONS: The patient is currently admitted to the hospital and receiving intravenous antibiotics. The antibiotics were administered within an appropriate time frame prior to the initiation of the procedure. ANESTHESIA/SEDATION: None COMPLICATIONS: None immediate. PROCEDURE: Informed written consent was obtained from the patient after a thorough discussion of the procedural risks, benefits and alternatives. All questions were addressed. Maximal Sterile Barrier  Technique was utilized including caps, mask, sterile gowns, sterile gloves, sterile drape, hand hygiene and skin antiseptic. A timeout was performed prior to the initiation of the procedure. The lateral aspect of the left knee was interrogated with ultrasound. A small complex fluid collection is identified in the superficial soft tissues. The overlying skin was sterilely prepped and draped in the standard fashion using chlorhexidine skin prep. Local anesthesia was attained by infiltration with 1% lidocaine. An 18 gauge trocar needle was carefully advanced into the fluid collection under continuous ultrasound guidance. Aspiration was then performed yielding 1.5 mL thick purulent fluid. This was sent for Gram stain and culture. IMPRESSION: Successful aspiration of small abscess in the superficial soft tissues at the lateral aspect of the knee yielding 1.5 mL thick purulent fluid. This was sent for Gram stain and culture. Electronically Signed   By: Jacqulynn Cadet M.D.   On: 11/01/2021 07:07   MR FOOT RIGHT WO CONTRAST  Result Date: 10/31/2021 CLINICAL DATA:  Foot swelling, diabetic, osteomyelitis suspected, xray done EXAM: MRI OF THE RIGHT FOREFOOT WITHOUT CONTRAST TECHNIQUE: Multiplanar, multisequence MR imaging of the right forefoot was performed. No intravenous contrast was administered. COMPARISON:  X-ray 10/28/2021 FINDINGS: Technical note: Mildly motion degraded images. Bones/Joint/Cartilage No acute fracture. No dislocation. Moderate degenerative changes of the foot, most pronounced at the first MTP joint and within the midfoot. No focal erosion or site of marrow replacement. Ligaments Intact Lisfranc ligament. Collateral ligaments of the forefoot appear intact. Muscles and Tendons Chronic denervation changes of the foot musculature. No tenosynovial fluid collection. Soft tissues Mild soft tissue edema.  No organized fluid collection. IMPRESSION: 1. Mildly motion degraded exam. 2. No acute osseous  abnormality of the right forefoot. 3. Mild soft tissue edema. No organized fluid collection. Electronically Signed   By: Davina Poke D.O.   On: 10/31/2021 18:56   MR FOOT LEFT WO CONTRAST  Result Date: 10/31/2021 CLINICAL DATA:  Foot swelling, diabetic, osteomyelitis suspected, xray done EXAM: MRI OF THE LEFT FOOT WITHOUT CONTRAST TECHNIQUE: Multiplanar, multisequence MR imaging of the left forefoot was performed. No intravenous contrast was administered. COMPARISON:  X-ray 10/28/2021 FINDINGS: Technical note: Severely motion limited images. There is poor fat saturation on T2 weighted images related to patient's hardware. Best possible images were obtained. Bones/Joint/Cartilage Susceptibility artifact from midfoot and first TMT effusions. No evidence of acute fracture or dislocation. No obvious sites of bone destruction or marrow replacement. Degenerative changes within the forefoot, better depicted radiographically. No large joint effusions are evident. Ligaments Suboptimally assessed. Muscles and Tendons Fatty atrophy of the foot musculature. No obvious tenosynovial fluid collection. Soft tissues Mild subcutaneous edema over the dorsum of the foot. No obvious fluid collections. IMPRESSION: 1. Severely limited exam. 2. No obvious acute osseous abnormality of the left forefoot. 3. Mild subcutaneous edema over the dorsum of the foot. No obvious fluid collections. Electronically Signed   By: Hart Carwin  Plundo D.O.   On: 10/31/2021 18:54        Scheduled Meds:  allopurinol  300 mg Oral q AM   DULoxetine  30 mg Oral Daily   insulin aspart  0-20 Units Subcutaneous TID WC   insulin aspart  0-5 Units Subcutaneous QHS   insulin glargine-yfgn  25 Units Subcutaneous Daily   levothyroxine  88 mcg Oral q morning   metolazone  2.5 mg Oral Once   metoprolol tartrate  12.5 mg Oral BID   potassium chloride  40 mEq Oral Q6H   pregabalin  50 mg Oral BID   Vitamin D (Ergocalciferol)  50,000 Units Oral Q7 days    Continuous Infusions:   ceFAZolin (ANCEF) IV 2 g (11/01/21 0901)   furosemide 100 mg (11/01/21 0905)   heparin 1,500 Units/hr (11/01/21 1055)     LOS: 3 days       Phillips Climes, MD [facility]   To contact the attending provider between 7A-7P or the covering provider during after hours 7P-7A, please log into the web site [email or web address] and access using universal [place] password for that web site. If you do not have the password, please call the hospital operator.  11/01/2021, 1:05 PM

## 2021-11-01 NOTE — Plan of Care (Signed)

## 2021-11-02 ENCOUNTER — Inpatient Hospital Stay (HOSPITAL_COMMUNITY): Payer: Medicare HMO

## 2021-11-02 DIAGNOSIS — I272 Pulmonary hypertension, unspecified: Secondary | ICD-10-CM

## 2021-11-02 DIAGNOSIS — R609 Edema, unspecified: Secondary | ICD-10-CM

## 2021-11-02 DIAGNOSIS — R7881 Bacteremia: Secondary | ICD-10-CM | POA: Diagnosis not present

## 2021-11-02 DIAGNOSIS — A419 Sepsis, unspecified organism: Secondary | ICD-10-CM | POA: Diagnosis not present

## 2021-11-02 DIAGNOSIS — I10 Essential (primary) hypertension: Secondary | ICD-10-CM | POA: Diagnosis not present

## 2021-11-02 DIAGNOSIS — I4891 Unspecified atrial fibrillation: Secondary | ICD-10-CM | POA: Diagnosis not present

## 2021-11-02 DIAGNOSIS — J9601 Acute respiratory failure with hypoxia: Secondary | ICD-10-CM | POA: Diagnosis not present

## 2021-11-02 DIAGNOSIS — I480 Paroxysmal atrial fibrillation: Secondary | ICD-10-CM | POA: Diagnosis not present

## 2021-11-02 LAB — BASIC METABOLIC PANEL
Anion gap: 11 (ref 5–15)
BUN: 99 mg/dL — ABNORMAL HIGH (ref 8–23)
CO2: 30 mmol/L (ref 22–32)
Calcium: 9.6 mg/dL (ref 8.9–10.3)
Chloride: 94 mmol/L — ABNORMAL LOW (ref 98–111)
Creatinine, Ser: 2.61 mg/dL — ABNORMAL HIGH (ref 0.44–1.00)
GFR, Estimated: 19 mL/min — ABNORMAL LOW (ref >60)
Glucose, Bld: 172 mg/dL — ABNORMAL HIGH (ref 70–99)
Potassium: 3.6 mmol/L (ref 3.5–5.1)
Sodium: 135 mmol/L (ref 135–145)

## 2021-11-02 LAB — CBC
HCT: 31.5 % — ABNORMAL LOW (ref 36.0–46.0)
Hemoglobin: 9.9 g/dL — ABNORMAL LOW (ref 12.0–15.0)
MCH: 28.2 pg (ref 26.0–34.0)
MCHC: 31.4 g/dL (ref 30.0–36.0)
MCV: 89.7 fL (ref 80.0–100.0)
Platelets: 123 10*3/uL — ABNORMAL LOW (ref 150–400)
RBC: 3.51 MIL/uL — ABNORMAL LOW (ref 3.87–5.11)
RDW: 16.6 % — ABNORMAL HIGH (ref 11.5–15.5)
WBC: 9.8 10*3/uL (ref 4.0–10.5)
nRBC: 0 % (ref 0.0–0.2)

## 2021-11-02 LAB — GLUCOSE, CAPILLARY
Glucose-Capillary: 189 mg/dL — ABNORMAL HIGH (ref 70–99)
Glucose-Capillary: 208 mg/dL — ABNORMAL HIGH (ref 70–99)
Glucose-Capillary: 197 mg/dL — ABNORMAL HIGH (ref 70–99)
Glucose-Capillary: 206 mg/dL — ABNORMAL HIGH (ref 70–99)

## 2021-11-02 LAB — HEPARIN LEVEL (UNFRACTIONATED): Heparin Unfractionated: 0.37 IU/mL (ref 0.30–0.70)

## 2021-11-02 MED ORDER — METOLAZONE 5 MG PO TABS
5.0000 mg | ORAL_TABLET | Freq: Once | ORAL | Status: AC
Start: 2021-11-02 — End: 2021-11-02
  Administered 2021-11-02: 5 mg via ORAL
  Filled 2021-11-02: qty 1

## 2021-11-02 MED ORDER — POTASSIUM CHLORIDE CRYS ER 20 MEQ PO TBCR
40.0000 meq | EXTENDED_RELEASE_TABLET | Freq: Once | ORAL | Status: AC
  Administered 2021-11-02: 40 meq via ORAL
  Filled 2021-11-02: qty 2

## 2021-11-02 MED ORDER — FUROSEMIDE 10 MG/ML IJ SOLN
100.0000 mg | Freq: Three times a day (TID) | INTRAVENOUS | Status: DC
  Administered 2021-11-02 – 2021-11-07 (×15): 100 mg via INTRAVENOUS
  Filled 2021-11-02 (×19): qty 10

## 2021-11-02 NOTE — Progress Notes (Signed)
PROGRESS NOTE    Kristine [name]  KDT:[id number] DOB: [birth year] DOA: 10/28/2021 PCP: Prince Solian, MD    Chief Complaint  Patient presents with   Altered Mental Status    Brief Narrative:   76 year old woman who presented to [facility] 8/8 with fatigue and AMS. PMHx significant for HTN, HLD, AF (previously on Eliquis), mild AS, CHF with cardiac amyloidosis (TTR, diagnosed 10/2021), COPD with chronic bronchitis, OSA, T2DM, hypothyroidism, CKD stage IV, GERD, depression/anxiety.   Per chart review/patient's husband, patient returned from [place] scan 8/8 and appeared extremely weak/fatigued and unable to get from the car to the house. Shortly after arriving home, patient c/o chills/being "freezing" and progressively became altered with increasing confusion and tachypnea. Tmax 101F measured at home and Tylenol was given. Of note, patient has had longstanding sores on bilateral feet (slow to heal in the setting of DM) and L knee (occurred s/p hematoma drainage).   On ED arrival, patient was febrile to 101.33F, tachycardic to 125, hypoxic to 70s on RA, hypotensive with BP 91/45 and altered. Labs were notable for WBC 26, H&H/Plt/INR WNL, Na 134, K 2.7, BUN 89/Cr 2.86, Tbili 2.2, LA 2.8, BNP 276, Trop 53. VBG demonstrated pH 7.398, pCO2 50.7. UA was unremarkable and BCx were obtained. CT Head NAICA, +atrophy and chronic microvascular disease. CT Chest/A/P without active CP disease, changes c/w cirrhosis, sigmoid diverticulitis and anasarca. XR of L knee was negative for acute abnormality and XR bilateral feet were obtained (L negative, R ?gouty changes at base of proximal phalanx of 1st digit, no definite osteomyelitis). Broad spectrum antibiotics were initiated and peripheral NE started for pressor support.   Given patient's ongoing hypotension, fever and AMS with concern for septic shock, patient was transferred to [facility] for further evaluation and workup. Patient admistted to ICU 10/29/2021    Assessment  & Plan:   Principal Problem:   Septic shock ([place]) Active Problems:   Diabetes mellitus, type 2 (HCC)   CKD (chronic kidney disease) stage 4, GFR 15-29 ml/min (HCC)   Essential hypertension   Pulmonary hypertension (HCC)   Persistent atrial fibrillation (HCC)   Paroxysmal atrial fibrillation (HCC)   Pressure injury of skin  Septic shock secondary to be strep bacteremia -Sepsis present on admission, hypotensive requiring pressors, leukocytosis, elevated lactic acid -Blood culture growing group be Streptococcus -ID input greatly appreciated, continue with IV cefazolin -TEE tomorrow per cardiology. -Orthopedic/plastic surgery consult greatly appreciated, no significant concern of left knee infection at this point -Fluid collection in left knee status post aspiration by IR, continue to follow on cultures, so far no growth to date -Surveillance blood cultures 8/11 remains with no growth to date  Atrial fibrillation History of persistent AF. Previously on Eliquis. Hx of amio toxicity in 2017 Management per cardiology, she did require Cardizem drip initially, -Heparin GTT for now, transition to Eliquis once stable  Acute on chronic right-sided heart failure Chronic diastolic CHF Acute on chronic right-sided heart failure Cardiac amyloidosis, TTR Pulm HTN Mild AS HTN HLD Echo 08/2021 with EF 55-60%, no RWMAs, mild LVH, D-shaped septum in diastole c/w severely elevated PASP, reduced RV function. 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 Rae Roam evacuation by Dr. Marla Roe -MRI with no evidence of infection, and bilateral feet or left knee,   AKI on CKD stage IV P: -repleted K; repeat bmp pending -Trend BMP / urinary output -Replace electrolytes as indicated -Avoid nephrotoxic agents, ensure adequate renal perfusion   Hypoglycemia T2DM Peripheral neuropathy -She is with significant insulin requirement at home, CBG started to increase will resume at a low dose Semglee and insulin sliding scale -on Cymbalta and Lyrica   Acute hypoxemic respiratory failure/insufficiency COPD with chronic bronchitis OSA: no cpap at home P: -continue to wean O2 for sats >92% -cpap qhs when less altered and safe to use -prn xopenex for wheezing   Atrial fibrillation History of persistent AF. Previously on Eliquis. Hx of amio toxicity in 2017 Management per cardiology, currently on Cardizem drip and heparin GTT  CHF Chronic diastolic CHF Acute on chronic right-sided heart failure Cardiac amyloidosis, TTR Pulm HTN Mild AS HTN HLD Echo 08/2021 with EF 55-60%, no RWMAs, mild LVH, D-shaped septum in diastole c/w severely elevated PASP, reduced RV function. Management per cardiology, on IV Lasix, no ACE/ARB due to poor renal function   GERD - PPI when able to take PO   Gout Resume allopurinol   Hypothyroidism - Resume home synthroid when able to take po   Depression Anxiety P: - hold xanax  Morbid obesity Body mass index is 44.18 kg/m.   Will Follow on right upper extremity venous Dopplers     DVT prophylaxis: Heparin GTT Code Status: Partial Family Communication: D/W husband at bedside Disposition:   Status is: Inpatient    Consultants:  PCCM ID plastic surgery Orthopedic     Subjective:  No significant events overnight, she denies any complaints  today  Objective: Vitals:   11/02/21 0350 11/02/21 0445 11/02/21 0800 11/02/21 0821  BP: 117/73  105/82 105/82  Pulse:   98 83  Resp:   17 16  Temp: 98.2 F (36.8 C)     TempSrc: Axillary     SpO2:   98% 95%  Weight:  135.7 kg    Height:        Intake/Output Summary (Last 24 hours) at 11/02/2021 1131 Last data filed at 11/02/2021 0800 Gross per 24 hour  Intake 700.69 ml  Output 1675 ml  Net -974.31 ml   Filed Weights   10/31/21 0338 11/01/21 1500 11/02/21 0445  Weight: (!) 137.1 kg 134.8 kg 135.7 kg    Examination:  Awake Alert, Oriented X 3, No new F.N deficits, Normal affect Symmetrical Chest wall movement, Good air movement bilaterally, CTAB RRR,No Gallops,Rubs or new Murmurs, No Parasternal Heave +ve B.Sounds, Abd Soft, No tenderness, No rebound - guarding or rigidity. No Cyanosis, Clubbing , + edema, No new Rash or bruise       Data Reviewed: I have personally reviewed following labs and imaging studies  CBC: Recent Labs  Lab 10/28/21 1920 10/28/21 1923 10/29/21 0201 10/29/21 0439 10/30/21 0058 10/31/21 0325 11/01/21 0313 11/02/21 0326  WBC 26.0*  --  40.6* 43.8* 24.3* 15.8* 11.8* 9.8  NEUTROABS 24.0*  --  37.3*  --   --   --   --   --   HGB 11.8*   < > 9.3* 10.0* 10.7* 10.7* 10.1* 9.9*  HCT 36.6   < >  29.0* 30.0* 32.4* 32.7* 30.9* 31.5*  MCV 88.8  --  89.5 88.0 87.6 87.4 88.3 89.7  PLT 192  --  138* 142* 141* 142* 218 123*   < > = values in this interval not displayed.    Basic Metabolic Panel: Recent Labs  Lab 10/28/21 1944 10/29/21 0439 10/29/21 1009 10/29/21 1925 10/30/21 0058 10/31/21 0325 11/01/21 0313 11/02/21 0326  NA  --  134*   < > 132* 132* 130* 134* 135  K  --  3.0*   < > 4.1 4.2 3.4* 3.4* 3.6  CL  --  93*   < > 93* 93* 93* 95* 94*  CO2  --  27   < > '22 26 23 25 30  '$ GLUCOSE  --  82   < > 145* 200* 240* 115* 172*  BUN  --  85*   < > 88* 90* 101* 102* 99*  CREATININE  --  2.87*   < > 2.80* 2.98* 2.82* 2.66* 2.61*  CALCIUM  --   8.8*   < > 9.0 9.2 9.1 9.4 9.6  MG 2.4 2.3  --   --   --   --   --   --   PHOS  --  4.8*  --   --   --   --   --   --    < > = values in this interval not displayed.    GFR: Estimated Creatinine Clearance: 28.1 mL/min (A) (by C-G formula based on SCr of 2.61 mg/dL (H)).  Liver Function Tests: Recent Labs  Lab 10/28/21 1920 10/29/21 0439  AST 21 16  ALT 9 9  ALKPHOS 110 76  BILITOT 2.2* 2.1*  PROT 7.8 5.7*  ALBUMIN 4.0 2.9*    CBG: Recent Labs  Lab 11/01/21 1302 11/01/21 1659 11/01/21 2124 11/02/21 0739 11/02/21 1115  GLUCAP 196* 185* 211* 189* 208*     Recent Results (from the past 240 hour(s))  SARS Coronavirus 2 by RT PCR (hospital order, performed in [facility] hospital lab) *cepheid single result test* Anterior Nasal Swab     Status: None   Collection Time: 10/28/21  6:50 PM   Specimen: Anterior Nasal Swab  Result Value Ref [place] Status   SARS Coronavirus 2 by RT PCR NEGATIVE NEGATIVE Final    Comment: (NOTE) SARS-CoV-2 target nucleic acids are NOT DETECTED.  The SARS-CoV-2 RNA is generally detectable in upper and lower respiratory specimens during the acute phase of infection. The lowest concentration of SARS-CoV-2 viral copies this assay can detect is 250 copies / mL. A negative result does not preclude SARS-CoV-2 infection and should not be used as the sole basis for treatment or other patient management decisions.  A negative result may occur with improper specimen collection / handling, submission of specimen other than nasopharyngeal swab, presence of viral mutation(s) within the areas targeted by this assay, and inadequate number of viral copies (<250 copies / mL). A negative result must be combined with clinical observations, patient history, and epidemiological information.  Fact Sheet for Patients:   https://www.patel.info/  Fact Sheet for Healthcare Providers: https://hall.com/  This test is not  yet approved or  cleared by the [place] FDA and has been authorized for detection and/or diagnosis of SARS-CoV-2 by FDA under an Emergency Use Authorization (EUA).  This EUA will remain in effect (meaning this test can be used) for the duration of the COVID-19 declaration under Section 564(b)(1) of the Act, 21 U.S.C. section 360bbb-3(b)(1),  unless the authorization is terminated or revoked sooner.  Performed at [facility], [address] [place], [place] [address]   Culture, blood (Routine x 2)     Status: Abnormal   Collection Time: 10/28/21  7:20 PM   Specimen: Left Antecubital; Blood  Result Value Ref [place] Status   Specimen Description   Final    LEFT ANTECUBITAL BLOOD Performed at [facility], [place] [address], [place], [place] [address]    Special Requests   Final    BOTTLES DRAWN AEROBIC AND ANAEROBIC Blood Culture adequate volume Performed at [facility], [address] [place], [place] [address]    Culture  Setup Time   Final    GRAM POSITIVE COCCI IN BOTH AEROBIC AND ANAEROBIC BOTTLES CRITICAL RESULT CALLED TO, READ BACK BY AND VERIFIED WITH: PHARMD A. PAYTES [id number] '@1039'$  FH    Culture (A)  Final    GROUP B STREP(S.AGALACTIAE)ISOLATED SUSCEPTIBILITIES PERFORMED ON PREVIOUS CULTURE WITHIN THE LAST 5 DAYS. Performed at [facility], [place] [address] [place], [place] [address]    Report Status 10/31/2021 FINAL  Final  Culture, blood (Routine x 2)     Status: Abnormal   Collection Time: 10/28/21  7:25 PM   Specimen: BLOOD LEFT ARM  Result Value [place] Status   Specimen Description   Final    BLOOD LEFT ARM Performed at [facility], [address] [place], [place] [address]    Special Requests   Final    BOTTLES DRAWN AEROBIC ONLY Blood Culture results may not be optimal due to an inadequate volume of blood received in culture bottles Performed at [facility], [address] [place], [place]  [address]    Culture  Setup Time   Final    GRAM POSITIVE COCCI IN CHAINS BOTTLES DRAWN AEROBIC ONLY CRITICAL RESULT CALLED TO, READ BACK BY AND VERIFIED WITH: PHARMD A. PAYTES [id number] '@1039'$  FH Performed at [facility] [address], [place], [place] [address]    Culture GROUP B STREP(S.AGALACTIAE)ISOLATED (A)  Final   Report Status 10/31/2021 FINAL  Final   Organism ID, Bacteria GROUP B STREP(S.AGALACTIAE)ISOLATED  Final      Susceptibility   Group b strep(s.agalactiae)isolated - MIC*    CLINDAMYCIN >=1 RESISTANT Resistant     AMPICILLIN <=0.25 SENSITIVE Sensitive     ERYTHROMYCIN >=8 RESISTANT Resistant     VANCOMYCIN 0.5 SENSITIVE Sensitive     CEFTRIAXONE <=0.12 SENSITIVE Sensitive     LEVOFLOXACIN 0.5 SENSITIVE Sensitive     PENICILLIN Value in next row Sensitive      SENSITIVE0.06    * GROUP B STREP(S.AGALACTIAE)ISOLATED  Blood Culture ID Panel (Reflexed)     Status: Abnormal   Collection Time: 10/28/21  7:25 PM  Result Value Ref [place] Status   Enterococcus faecalis NOT DETECTED NOT DETECTED Final   Enterococcus Faecium NOT DETECTED NOT DETECTED Final   Listeria monocytogenes NOT DETECTED NOT DETECTED Final   Staphylococcus species NOT DETECTED NOT DETECTED Final   Staphylococcus aureus (BCID) NOT DETECTED NOT DETECTED Final   Staphylococcus epidermidis NOT DETECTED NOT DETECTED Final   Staphylococcus lugdunensis NOT DETECTED NOT DETECTED Final   Streptococcus species DETECTED (A) NOT DETECTED Final    Comment: CRITICAL RESULT CALLED TO, READ BACK BY AND VERIFIED WITH: PHARMD A. PAYTES [id number] '@1039'$  FH    Streptococcus agalactiae DETECTED (A) NOT DETECTED Final  Comment: CRITICAL RESULT CALLED TO, READ BACK BY AND VERIFIED WITH: PHARMD A. PAYTES [id number] '@1039'$  FH    Streptococcus pneumoniae NOT DETECTED NOT DETECTED Final   Streptococcus pyogenes NOT DETECTED NOT DETECTED Final   A.calcoaceticus-baumannii NOT DETECTED NOT DETECTED Final   Bacteroides fragilis NOT  DETECTED NOT DETECTED Final   Enterobacterales NOT DETECTED NOT DETECTED Final   Enterobacter cloacae complex NOT DETECTED NOT DETECTED Final   Escherichia coli NOT DETECTED NOT DETECTED Final   Klebsiella aerogenes NOT DETECTED NOT DETECTED Final   Klebsiella oxytoca NOT DETECTED NOT DETECTED Final   Klebsiella pneumoniae NOT DETECTED NOT DETECTED Final   Proteus species NOT DETECTED NOT DETECTED Final   Salmonella species NOT DETECTED NOT DETECTED Final   Serratia marcescens NOT DETECTED NOT DETECTED Final   Haemophilus influenzae NOT DETECTED NOT DETECTED Final   Neisseria meningitidis NOT DETECTED NOT DETECTED Final   Pseudomonas aeruginosa NOT DETECTED NOT DETECTED Final   Stenotrophomonas maltophilia NOT DETECTED NOT DETECTED Final   Candida albicans NOT DETECTED NOT DETECTED Final   Candida auris NOT DETECTED NOT DETECTED Final   Candida glabrata NOT DETECTED NOT DETECTED Final   Candida krusei NOT DETECTED NOT DETECTED Final   Candida parapsilosis NOT DETECTED NOT DETECTED Final   Candida tropicalis NOT DETECTED NOT DETECTED Final   Cryptococcus neoformans/gattii NOT DETECTED NOT DETECTED Final    Comment: Performed at [facility], [address] [place], [place] [address]  MRSA Next Gen by PCR, Nasal     Status: None   Collection Time: 10/29/21  1:06 AM   Specimen: Nasal Mucosa; Nasal Swab  Result Value Ref [place] Status   MRSA by PCR Next Gen NOT DETECTED NOT DETECTED Final    Comment: (NOTE) The GeneXpert MRSA Assay (FDA approved for NASAL specimens only), is one component of a comprehensive MRSA colonization surveillance program. It is not intended to diagnose MRSA infection nor to guide or monitor treatment for MRSA infections. Test performance is not FDA approved in patients less than 59 years old. Performed at [facility], [place] [address], [place], [place] [address]   Culture, blood (Routine X 2) w Reflex to ID Panel     Status: None (Preliminary  result)   Collection Time: 10/31/21 11:44 AM   Specimen: BLOOD  Result Value Ref [place] Status   Specimen Description BLOOD BLOOD RIGHT HAND  Final   Special Requests   Final    BOTTLES DRAWN AEROBIC AND ANAEROBIC Blood Culture results may not be optimal due to an inadequate volume of blood received in culture bottles   Culture   Final    NO GROWTH 2 DAYS Performed at [facility], [place] [address], [place], [place] [address]    Report Status PENDING  Incomplete  Culture, blood (Routine X 2) w Reflex to ID Panel     Status: None (Preliminary result)   Collection Time: 10/31/21 11:56 AM   Specimen: BLOOD  Result Value Ref [place] Status   Specimen Description BLOOD BLOOD LEFT HAND  Final   Special Requests   Final    BOTTLES DRAWN AEROBIC ONLY Blood Culture adequate volume   Culture   Final    NO GROWTH 2 DAYS Performed at [facility], [place] [address], [place], [place] [address]    Report Status PENDING  Incomplete  Aerobic/Anaerobic Culture w Gram Stain (surgical/deep wound)     Status: None (Preliminary result)   Collection Time: 10/31/21  5:15 PM   Specimen: Abscess  Result Value Ref [place] Status   Specimen Description ABSCESS LEFT KNEE  Final   Special Requests NONE  Final   Gram Stain   Final    NO ORGANISMS SEEN MODERATE WBC PRESENT, PREDOMINANTLY PMN    Culture   Final    NO GROWTH 2 DAYS Performed at [facility], [address], [place], [place] [address]    Report Status PENDING  Incomplete         Radiology Studies: IR US Guide Bx Asp/Drain  Result Date: 11/01/2021 INDICATION: Bacteremia, small fluid collection in the lateral aspect of the knee EXAM: Ultrasound aspiration MEDICATIONS: The patient is currently admitted to the hospital and receiving intravenous antibiotics. The antibiotics were administered within an appropriate time frame prior to the initiation of the procedure. ANESTHESIA/SEDATION: None COMPLICATIONS: None immediate. PROCEDURE:  Informed written consent was obtained from the patient after a thorough discussion of the procedural risks, benefits and alternatives. All questions were addressed. Maximal Sterile Barrier Technique was utilized including caps, mask, sterile gowns, sterile gloves, sterile drape, hand hygiene and skin antiseptic. A timeout was performed prior to the initiation of the procedure. The lateral aspect of the left knee was interrogated with ultrasound. A small complex fluid collection is identified in the superficial soft tissues. The overlying skin was sterilely prepped and draped in the standard fashion using chlorhexidine skin prep. Local anesthesia was attained by infiltration with 1% lidocaine. An 18 gauge trocar needle was carefully advanced into the fluid collection under continuous ultrasound guidance. Aspiration was then performed yielding 1.5 mL thick purulent fluid. This was sent for Gram stain and culture. IMPRESSION: Successful aspiration of small abscess in the superficial soft tissues at the lateral aspect of the knee yielding 1.5 mL thick purulent fluid. This was sent for Gram stain and culture. Electronically Signed   By: Jacqulynn Cadet M.D.   On: 11/01/2021 07:07   MR FOOT RIGHT WO CONTRAST  Result Date: 10/31/2021 CLINICAL DATA:  Foot swelling, diabetic, osteomyelitis suspected, xray done EXAM: MRI OF THE RIGHT FOREFOOT WITHOUT CONTRAST TECHNIQUE: Multiplanar, multisequence MR imaging of the right forefoot was performed. No intravenous contrast was administered. COMPARISON:  X-ray 10/28/2021 FINDINGS: Technical note: Mildly motion degraded images. Bones/Joint/Cartilage No acute fracture. No dislocation. Moderate degenerative changes of the foot, most pronounced at the first MTP joint and within the midfoot. No focal erosion or site of marrow replacement. Ligaments Intact Lisfranc ligament. Collateral ligaments of the forefoot appear intact. Muscles and Tendons Chronic denervation changes of the  foot musculature. No tenosynovial fluid collection. Soft tissues Mild soft tissue edema.  No organized fluid collection. IMPRESSION: 1. Mildly motion degraded exam. 2. No acute osseous abnormality of the right forefoot. 3. Mild soft tissue edema. No organized fluid collection. Electronically Signed   By: Davina Poke D.O.   On: 10/31/2021 18:56   MR FOOT LEFT WO CONTRAST  Result Date: 10/31/2021 CLINICAL DATA:  Foot swelling, diabetic, osteomyelitis suspected, xray done EXAM: MRI OF THE LEFT FOOT WITHOUT CONTRAST TECHNIQUE: Multiplanar, multisequence MR imaging of the left forefoot was performed. No intravenous contrast was administered. COMPARISON:  X-ray 10/28/2021 FINDINGS: Technical note: Severely motion limited images. There is poor fat saturation on T2 weighted images related to patient's hardware. Best possible images were obtained. Bones/Joint/Cartilage Susceptibility artifact from midfoot and first TMT effusions. No evidence of acute fracture or dislocation. No obvious sites of bone destruction or marrow replacement. Degenerative changes within the forefoot, better  depicted radiographically. No large joint effusions are evident. Ligaments Suboptimally assessed. Muscles and Tendons Fatty atrophy of the foot musculature. No obvious tenosynovial fluid collection. Soft tissues Mild subcutaneous edema over the dorsum of the foot. No obvious fluid collections. IMPRESSION: 1. Severely limited exam. 2. No obvious acute osseous abnormality of the left forefoot. 3. Mild subcutaneous edema over the dorsum of the foot. No obvious fluid collections. Electronically Signed   By: Davina Poke D.O.   On: 10/31/2021 18:54        Scheduled Meds:  allopurinol  300 mg Oral q AM   DULoxetine  30 mg Oral Daily   insulin aspart  0-20 Units Subcutaneous TID WC   insulin aspart  0-5 Units Subcutaneous QHS   insulin glargine-yfgn  25 Units Subcutaneous Daily   levothyroxine  88 mcg Oral q morning   metolazone   5 mg Oral Once   metoprolol tartrate  12.5 mg Oral BID   potassium chloride  40 mEq Oral Once   pregabalin  50 mg Oral BID   Vitamin D (Ergocalciferol)  50,000 Units Oral Q7 days   Continuous Infusions:   ceFAZolin (ANCEF) IV 2 g (11/02/21 0853)   furosemide     heparin 1,500 Units/hr (11/02/21 0800)     LOS: 4 days       Phillips Climes, MD [facility]   To contact the attending provider between 7A-7P or the covering provider during after hours 7P-7A, please log into the web site [email or web address] and access using universal [place] password for that web site. If you do not have the password, please call the hospital operator.  11/02/2021, 11:31 AM

## 2021-11-02 NOTE — H&P (View-Only) (Signed)
Progress Note  Patient Name: Kristine [name] Date of Encounter: 11/02/2021  [facility] Cardiologist: Glori Bickers, MD   Subjective   Complains of RUE swelling. Breathing okay. Still tired and more confused this morning.  Wt significantly up from dry weight. Current weight 297lb, dry weight around 274-277lbs due to IVF resuscitation for septic shock and need to hold diuretics.   Started on lasix '100mg'$  IV BID and given dose of metolazone yesterday; Cr stable 2.66>2.61. Net negative only 380 but 1675 UOP recorded. No standing weight today.  Inpatient Medications    Scheduled Meds:  allopurinol  300 mg Oral q AM   DULoxetine  30 mg Oral Daily   insulin aspart  0-20 Units Subcutaneous TID WC   insulin aspart  0-5 Units Subcutaneous QHS   insulin glargine-yfgn  25 Units Subcutaneous Daily   levothyroxine  88 mcg Oral q morning   metoprolol tartrate  12.5 mg Oral BID   pregabalin  50 mg Oral BID   Vitamin D (Ergocalciferol)  50,000 Units Oral Q7 days   Continuous Infusions:   ceFAZolin (ANCEF) IV 2 g (11/01/21 2221)   furosemide 60 mL/hr at 11/01/21 1800   heparin 1,500 Units/hr (11/02/21 0433)   PRN Meds: acetaminophen, ALPRAZolam, diphenhydrAMINE, docusate sodium, levalbuterol, mineral oil-hydrophilic petrolatum, mouth rinse, polyethylene glycol, traMADol   Vital Signs    Vitals:   11/01/21 2029 11/01/21 2316 11/02/21 0350 11/02/21 0445  BP: 117/74 (!) 109/55 117/73   Pulse: 82 98    Resp: 17 18    Temp: 98.2 F (36.8 C) 98 F (36.7 C) 98.2 F (36.8 C)   TempSrc: Oral Axillary Axillary   SpO2: 99% 98%    Weight:    135.7 kg  Height:        Intake/Output Summary (Last 24 hours) at 11/02/2021 0649 Last data filed at 11/02/2021 0300 Gross per 24 hour  Intake 1294.66 ml  Output 1675 ml  Net -380.34 ml       11/02/2021    4:45 AM 11/01/2021    3:00 PM 10/31/2021    3:38 AM  Last 3 Weights  Weight (lbs) 299 lb 2.6 oz 297 lb 1.6 oz 302 lb 4 oz   Weight (kg) 135.7 kg 134.764 kg 137.1 kg      Telemetry    Afib, rate controlled. Rare NSVT of 3 beats - Personally Reviewed  ECG    No new tracing - Personally Reviewed  Physical Exam   GEN: Laying in bed, answering questions but slow to respond Neck: JVD difficult to assess due to body habitus Cardiac: Irregular Respiratory: CTAB  GI: Obese, soft MS: 1+ RUE swelling. 1+ bilateral LE swelling Neuro:  AAOx3 but slow to respond  Psych: Normal affect   Labs    High Sensitivity Troponin:   Recent Labs  Lab 10/28/21 1945 10/28/21 2139  TROPONINIHS 53* 84*      Chemistry Recent Labs  Lab 10/28/21 1920 10/28/21 1923 10/28/21 1944 10/29/21 0439 10/29/21 1009 10/31/21 0325 11/01/21 0313 11/02/21 0326  NA 134*   < >  --  134*   < > 130* 134* 135  K 2.7*   < >  --  3.0*   < > 3.4* 3.4* 3.6  CL 91*  --   --  93*   < > 93* 95* 94*  CO2 28  --   --  27   < > '23 25 30  '$ GLUCOSE 117*  --   --  82   < > 240* 115* 172*  BUN 89*  --   --  85*   < > 101* 102* 99*  CREATININE 2.86*  --   --  2.87*   < > 2.82* 2.66* 2.61*  CALCIUM 9.6  --   --  8.8*   < > 9.1 9.4 9.6  MG  --   --  2.4 2.3  --   --   --   --   PROT 7.8  --   --  5.7*  --   --   --   --   ALBUMIN 4.0  --   --  2.9*  --   --   --   --   AST 21  --   --  16  --   --   --   --   ALT 9  --   --  9  --   --   --   --   ALKPHOS 110  --   --  76  --   --   --   --   BILITOT 2.2*  --   --  2.1*  --   --   --   --   GFRNONAA 17*  --   --  17*   < > 17* 18* 19*  ANIONGAP 15  --   --  14   < > '14 14 11   '$ < > = values in this interval not displayed.     Lipids No results for input(s): "CHOL", "TRIG", "HDL", "LABVLDL", "LDLCALC", "CHOLHDL" in the last 168 hours.  Hematology Recent Labs  Lab 10/31/21 0325 11/01/21 0313 11/02/21 0326  WBC 15.8* 11.8* 9.8  RBC 3.74* 3.50* 3.51*  HGB 10.7* 10.1* 9.9*  HCT 32.7* 30.9* 31.5*  MCV 87.4 88.3 89.7  MCH 28.6 28.9 28.2  MCHC 32.7 32.7 31.4  RDW 16.6* 16.7* 16.6*  PLT  142* 218 123*    Thyroid  Recent Labs  Lab 10/29/21 1009  TSH 2.149     BNP Recent Labs  Lab 10/28/21 1945  BNP 276.7*     DDimer No results for input(s): "DDIMER" in the last 168 hours.   Radiology    IR US Guide Bx Asp/Drain  Result Date: 11/01/2021 INDICATION: Bacteremia, small fluid collection in the lateral aspect of the knee EXAM: Ultrasound aspiration MEDICATIONS: The patient is currently admitted to the hospital and receiving intravenous antibiotics. The antibiotics were administered within an appropriate time frame prior to the initiation of the procedure. ANESTHESIA/SEDATION: None COMPLICATIONS: None immediate. PROCEDURE: Informed written consent was obtained from the patient after a thorough discussion of the procedural risks, benefits and alternatives. All questions were addressed. Maximal Sterile Barrier Technique was utilized including caps, mask, sterile gowns, sterile gloves, sterile drape, hand hygiene and skin antiseptic. A timeout was performed prior to the initiation of the procedure. The lateral aspect of the left knee was interrogated with ultrasound. A small complex fluid collection is identified in the superficial soft tissues. The overlying skin was sterilely prepped and draped in the standard fashion using chlorhexidine skin prep. Local anesthesia was attained by infiltration with 1% lidocaine. An 18 gauge trocar needle was carefully advanced into the fluid collection under continuous ultrasound guidance. Aspiration was then performed yielding 1.5 mL thick purulent fluid. This was sent for Gram stain and culture. IMPRESSION: Successful aspiration of small abscess in the superficial soft tissues at the lateral aspect of the knee yielding 1.5  mL thick purulent fluid. This was sent for Gram stain and culture. Electronically Signed   By: Jacqulynn Cadet M.D.   On: 11/01/2021 07:07   MR FOOT RIGHT WO CONTRAST  Result Date: 10/31/2021 CLINICAL DATA:  Foot swelling,  diabetic, osteomyelitis suspected, xray done EXAM: MRI OF THE RIGHT FOREFOOT WITHOUT CONTRAST TECHNIQUE: Multiplanar, multisequence MR imaging of the right forefoot was performed. No intravenous contrast was administered. COMPARISON:  X-ray 10/28/2021 FINDINGS: Technical note: Mildly motion degraded images. Bones/Joint/Cartilage No acute fracture. No dislocation. Moderate degenerative changes of the foot, most pronounced at the first MTP joint and within the midfoot. No focal erosion or site of marrow replacement. Ligaments Intact Lisfranc ligament. Collateral ligaments of the forefoot appear intact. Muscles and Tendons Chronic denervation changes of the foot musculature. No tenosynovial fluid collection. Soft tissues Mild soft tissue edema.  No organized fluid collection. IMPRESSION: 1. Mildly motion degraded exam. 2. No acute osseous abnormality of the right forefoot. 3. Mild soft tissue edema. No organized fluid collection. Electronically Signed   By: Davina Poke D.O.   On: 10/31/2021 18:56   MR FOOT LEFT WO CONTRAST  Result Date: 10/31/2021 CLINICAL DATA:  Foot swelling, diabetic, osteomyelitis suspected, xray done EXAM: MRI OF THE LEFT FOOT WITHOUT CONTRAST TECHNIQUE: Multiplanar, multisequence MR imaging of the left forefoot was performed. No intravenous contrast was administered. COMPARISON:  X-ray 10/28/2021 FINDINGS: Technical note: Severely motion limited images. There is poor fat saturation on T2 weighted images related to patient's hardware. Best possible images were obtained. Bones/Joint/Cartilage Susceptibility artifact from midfoot and first TMT effusions. No evidence of acute fracture or dislocation. No obvious sites of bone destruction or marrow replacement. Degenerative changes within the forefoot, better depicted radiographically. No large joint effusions are evident. Ligaments Suboptimally assessed. Muscles and Tendons Fatty atrophy of the foot musculature. No obvious tenosynovial fluid  collection. Soft tissues Mild subcutaneous edema over the dorsum of the foot. No obvious fluid collections. IMPRESSION: 1. Severely limited exam. 2. No obvious acute osseous abnormality of the left forefoot. 3. Mild subcutaneous edema over the dorsum of the foot. No obvious fluid collections. Electronically Signed   By: Davina Poke D.O.   On: 10/31/2021 18:54    Cardiac Studies   Echocardiogram 09/12/21 1. Left ventricular ejection fraction, by estimation, is 55 to 60%. The  left ventricle has normal function. The left ventricle has no regional  wall motion abnormalities. There is mild left ventricular hypertrophy.  Left ventricular diastolic function  could not be evaluated. There is the interventricular septum is flattened  in diastole ('D' shaped left ventricle), consistent with right ventricular  volume overload.   2. Right ventricular systolic function is severely reduced. The right  ventricular size is severely enlarged. There is severely elevated  pulmonary artery systolic pressure.   3. The mitral valve is normal in structure. No evidence of mitral valve  regurgitation.   4. Tricuspid valve regurgitation is mild to moderate.   5. The aortic valve is calcified. Aortic valve regurgitation is not  visualized. Mild aortic valve stenosis.   6. The inferior vena cava is dilated in size with <50% respiratory  variability, suggesting right atrial pressure of 15 mmHg.   Patient Profile     76 y.o. female with a hx of persistent atrial fibrillation, CHF, CKD, DM, hypertension, hypothyroidism, hyperlipidemia, sleep apnea who is being seen for the evaluation of CHF   Assessment & Plan    Acute on Chronic right sided heart failure  Chronic HFpEF  Pulmonary HTN  - Echo from 08/08/20 with EF 45-50%, severely reduced RV function, RVSP 55, D-shaped septum, severe biatrial enlargement  - Echo from 09/12/21 with EF 55-60%, severely reduced RV systolic function, RVSP 60, D-shaped septum  -  Per review of advanced heart failure notes, given patient's history of atrial fibrillation, low voltage QRS complexes, CKD, and polyneuropathy there was concern for possible amyloid - Underwent PYP scan on 8/8 with equivocal study per my review; unlikely amyloid is the primary driver of heart failure - Now, patient is admitted with sepsis. Receiving IV abx  - Wt has been increasing (dry weight 274-277lbs based on last discharge). Lasix initially held due to septic shock and need for IVF resuscitation. Now resumed given improvement from a sepsis standpoint - Increase lasix to '100mg'$  IV TID (was used on prior admission with good response) - Dose metolazone '5mg'$  today - Worry about long term prognosis given right heart failure, frequent hospitalizations and medical comorbidities. Recommend palliative care consultation. - Needs daily standing weights to help gauge response to IV diuresis - Not on ACEi/ARB/ARNI or MRA due to poor renal function  Chronic Atrial Fibrillation - Continue metop 12.'5mg'$  BID - Avoid dilt due to severely reduced RV systolic function - PTA patient was taking xarelto 15 mg daily, developed oral bleeding. Was later switched to eliqius 2.5 mg BID, also developed oral bleeding.  - CHADS-VASc 5, stared on IV heparin and patient denies any bleeding. Hopefully can transition back to PO apixaban prior to discharge if no recurrence of bleeding  Strep Bacteremia Septic Shock -ID following -On cefazolin -Planned for TEE Monday; high risk for anesthesia given RV dysfunction and pulmonary HTN; discussed with Dr. 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

## 2021-11-02 NOTE — Progress Notes (Signed)
Progress Note  Patient Name: Kristine [name] Date of Encounter: 11/02/2021  [facility] Cardiologist: Glori Bickers, MD   Subjective   Complains of RUE swelling. Breathing okay. Still tired and more confused this morning.  Wt significantly up from dry weight. Current weight 297lb, dry weight around 274-277lbs due to IVF resuscitation for septic shock and need to hold diuretics.   Started on lasix '100mg'$  IV BID and given dose of metolazone yesterday; Cr stable 2.66>2.61. Net negative only 380 but 1675 UOP recorded. No standing weight today.  Inpatient Medications    Scheduled Meds:  allopurinol  300 mg Oral q AM   DULoxetine  30 mg Oral Daily   insulin aspart  0-20 Units Subcutaneous TID WC   insulin aspart  0-5 Units Subcutaneous QHS   insulin glargine-yfgn  25 Units Subcutaneous Daily   levothyroxine  88 mcg Oral q morning   metoprolol tartrate  12.5 mg Oral BID   pregabalin  50 mg Oral BID   Vitamin D (Ergocalciferol)  50,000 Units Oral Q7 days   Continuous Infusions:   ceFAZolin (ANCEF) IV 2 g (11/01/21 2221)   furosemide 60 mL/hr at 11/01/21 1800   heparin 1,500 Units/hr (11/02/21 0433)   PRN Meds: acetaminophen, ALPRAZolam, diphenhydrAMINE, docusate sodium, levalbuterol, mineral oil-hydrophilic petrolatum, mouth rinse, polyethylene glycol, traMADol   Vital Signs    Vitals:   11/01/21 2029 11/01/21 2316 11/02/21 0350 11/02/21 0445  BP: 117/74 (!) 109/55 117/73   Pulse: 82 98    Resp: 17 18    Temp: 98.2 F (36.8 C) 98 F (36.7 C) 98.2 F (36.8 C)   TempSrc: Oral Axillary Axillary   SpO2: 99% 98%    Weight:    135.7 kg  Height:        Intake/Output Summary (Last 24 hours) at 11/02/2021 0649 Last data filed at 11/02/2021 0300 Gross per 24 hour  Intake 1294.66 ml  Output 1675 ml  Net -380.34 ml       11/02/2021    4:45 AM 11/01/2021    3:00 PM 10/31/2021    3:38 AM  Last 3 Weights  Weight (lbs) 299 lb 2.6 oz 297 lb 1.6 oz 302 lb 4 oz   Weight (kg) 135.7 kg 134.764 kg 137.1 kg      Telemetry    Afib, rate controlled. Rare NSVT of 3 beats - Personally Reviewed  ECG    No new tracing - Personally Reviewed  Physical Exam   GEN: Laying in bed, answering questions but slow to respond Neck: JVD difficult to assess due to body habitus Cardiac: Irregular Respiratory: CTAB  GI: Obese, soft MS: 1+ RUE swelling. 1+ bilateral LE swelling Neuro:  AAOx3 but slow to respond  Psych: Normal affect   Labs    High Sensitivity Troponin:   Recent Labs  Lab 10/28/21 1945 10/28/21 2139  TROPONINIHS 53* 84*      Chemistry Recent Labs  Lab 10/28/21 1920 10/28/21 1923 10/28/21 1944 10/29/21 0439 10/29/21 1009 10/31/21 0325 11/01/21 0313 11/02/21 0326  NA 134*   < >  --  134*   < > 130* 134* 135  K 2.7*   < >  --  3.0*   < > 3.4* 3.4* 3.6  CL 91*  --   --  93*   < > 93* 95* 94*  CO2 28  --   --  27   < > '23 25 30  '$ GLUCOSE 117*  --   --  82   < > 240* 115* 172*  BUN 89*  --   --  85*   < > 101* 102* 99*  CREATININE 2.86*  --   --  2.87*   < > 2.82* 2.66* 2.61*  CALCIUM 9.6  --   --  8.8*   < > 9.1 9.4 9.6  MG  --   --  2.4 2.3  --   --   --   --   PROT 7.8  --   --  5.7*  --   --   --   --   ALBUMIN 4.0  --   --  2.9*  --   --   --   --   AST 21  --   --  16  --   --   --   --   ALT 9  --   --  9  --   --   --   --   ALKPHOS 110  --   --  76  --   --   --   --   BILITOT 2.2*  --   --  2.1*  --   --   --   --   GFRNONAA 17*  --   --  17*   < > 17* 18* 19*  ANIONGAP 15  --   --  14   < > '14 14 11   '$ < > = values in this interval not displayed.     Lipids No results for input(s): "CHOL", "TRIG", "HDL", "LABVLDL", "LDLCALC", "CHOLHDL" in the last 168 hours.  Hematology Recent Labs  Lab 10/31/21 0325 11/01/21 0313 11/02/21 0326  WBC 15.8* 11.8* 9.8  RBC 3.74* 3.50* 3.51*  HGB 10.7* 10.1* 9.9*  HCT 32.7* 30.9* 31.5*  MCV 87.4 88.3 89.7  MCH 28.6 28.9 28.2  MCHC 32.7 32.7 31.4  RDW 16.6* 16.7* 16.6*  PLT  142* 218 123*    Thyroid  Recent Labs  Lab 10/29/21 1009  TSH 2.149     BNP Recent Labs  Lab 10/28/21 1945  BNP 276.7*     DDimer No results for input(s): "DDIMER" in the last 168 hours.   Radiology    IR US Guide Bx Asp/Drain  Result Date: 11/01/2021 INDICATION: Bacteremia, small fluid collection in the lateral aspect of the knee EXAM: Ultrasound aspiration MEDICATIONS: The patient is currently admitted to the hospital and receiving intravenous antibiotics. The antibiotics were administered within an appropriate time frame prior to the initiation of the procedure. ANESTHESIA/SEDATION: None COMPLICATIONS: None immediate. PROCEDURE: Informed written consent was obtained from the patient after a thorough discussion of the procedural risks, benefits and alternatives. All questions were addressed. Maximal Sterile Barrier Technique was utilized including caps, mask, sterile gowns, sterile gloves, sterile drape, hand hygiene and skin antiseptic. A timeout was performed prior to the initiation of the procedure. The lateral aspect of the left knee was interrogated with ultrasound. A small complex fluid collection is identified in the superficial soft tissues. The overlying skin was sterilely prepped and draped in the standard fashion using chlorhexidine skin prep. Local anesthesia was attained by infiltration with 1% lidocaine. An 18 gauge trocar needle was carefully advanced into the fluid collection under continuous ultrasound guidance. Aspiration was then performed yielding 1.5 mL thick purulent fluid. This was sent for Gram stain and culture. IMPRESSION: Successful aspiration of small abscess in the superficial soft tissues at the lateral aspect of the knee yielding 1.5  mL thick purulent fluid. This was sent for Gram stain and culture. Electronically Signed   By: Jacqulynn Cadet M.D.   On: 11/01/2021 07:07   MR FOOT RIGHT WO CONTRAST  Result Date: 10/31/2021 CLINICAL DATA:  Foot swelling,  diabetic, osteomyelitis suspected, xray done EXAM: MRI OF THE RIGHT FOREFOOT WITHOUT CONTRAST TECHNIQUE: Multiplanar, multisequence MR imaging of the right forefoot was performed. No intravenous contrast was administered. COMPARISON:  X-ray 10/28/2021 FINDINGS: Technical note: Mildly motion degraded images. Bones/Joint/Cartilage No acute fracture. No dislocation. Moderate degenerative changes of the foot, most pronounced at the first MTP joint and within the midfoot. No focal erosion or site of marrow replacement. Ligaments Intact Lisfranc ligament. Collateral ligaments of the forefoot appear intact. Muscles and Tendons Chronic denervation changes of the foot musculature. No tenosynovial fluid collection. Soft tissues Mild soft tissue edema.  No organized fluid collection. IMPRESSION: 1. Mildly motion degraded exam. 2. No acute osseous abnormality of the right forefoot. 3. Mild soft tissue edema. No organized fluid collection. Electronically Signed   By: Davina Poke D.O.   On: 10/31/2021 18:56   MR FOOT LEFT WO CONTRAST  Result Date: 10/31/2021 CLINICAL DATA:  Foot swelling, diabetic, osteomyelitis suspected, xray done EXAM: MRI OF THE LEFT FOOT WITHOUT CONTRAST TECHNIQUE: Multiplanar, multisequence MR imaging of the left forefoot was performed. No intravenous contrast was administered. COMPARISON:  X-ray 10/28/2021 FINDINGS: Technical note: Severely motion limited images. There is poor fat saturation on T2 weighted images related to patient's hardware. Best possible images were obtained. Bones/Joint/Cartilage Susceptibility artifact from midfoot and first TMT effusions. No evidence of acute fracture or dislocation. No obvious sites of bone destruction or marrow replacement. Degenerative changes within the forefoot, better depicted radiographically. No large joint effusions are evident. Ligaments Suboptimally assessed. Muscles and Tendons Fatty atrophy of the foot musculature. No obvious tenosynovial fluid  collection. Soft tissues Mild subcutaneous edema over the dorsum of the foot. No obvious fluid collections. IMPRESSION: 1. Severely limited exam. 2. No obvious acute osseous abnormality of the left forefoot. 3. Mild subcutaneous edema over the dorsum of the foot. No obvious fluid collections. Electronically Signed   By: Davina Poke D.O.   On: 10/31/2021 18:54    Cardiac Studies   Echocardiogram 09/12/21 1. Left ventricular ejection fraction, by estimation, is 55 to 60%. The  left ventricle has normal function. The left ventricle has no regional  wall motion abnormalities. There is mild left ventricular hypertrophy.  Left ventricular diastolic function  could not be evaluated. There is the interventricular septum is flattened  in diastole ('D' shaped left ventricle), consistent with right ventricular  volume overload.   2. Right ventricular systolic function is severely reduced. The right  ventricular size is severely enlarged. There is severely elevated  pulmonary artery systolic pressure.   3. The mitral valve is normal in structure. No evidence of mitral valve  regurgitation.   4. Tricuspid valve regurgitation is mild to moderate.   5. The aortic valve is calcified. Aortic valve regurgitation is not  visualized. Mild aortic valve stenosis.   6. The inferior vena cava is dilated in size with <50% respiratory  variability, suggesting right atrial pressure of 15 mmHg.   Patient Profile     76 y.o. female with a hx of persistent atrial fibrillation, CHF, CKD, DM, hypertension, hypothyroidism, hyperlipidemia, sleep apnea who is being seen for the evaluation of CHF   Assessment & Plan    Acute on Chronic right sided heart failure  Chronic HFpEF  Pulmonary HTN  - Echo from 08/08/20 with EF 45-50%, severely reduced RV function, RVSP 55, D-shaped septum, severe biatrial enlargement  - Echo from 09/12/21 with EF 55-60%, severely reduced RV systolic function, RVSP 60, D-shaped septum  -  Per review of advanced heart failure notes, given patient's history of atrial fibrillation, low voltage QRS complexes, CKD, and polyneuropathy there was concern for possible amyloid - Underwent PYP scan on 8/8 with equivocal study per my review; unlikely amyloid is the primary driver of heart failure - Now, patient is admitted with sepsis. Receiving IV abx  - Wt has been increasing (dry weight 274-277lbs based on last discharge). Lasix initially held due to septic shock and need for IVF resuscitation. Now resumed given improvement from a sepsis standpoint - Increase lasix to '100mg'$  IV TID (was used on prior admission with good response) - Dose metolazone '5mg'$  today - Worry about long term prognosis given right heart failure, frequent hospitalizations and medical comorbidities. Recommend palliative care consultation. - Needs daily standing weights to help gauge response to IV diuresis - Not on ACEi/ARB/ARNI or MRA due to poor renal function  Chronic Atrial Fibrillation - Continue metop 12.'5mg'$  BID - Avoid dilt due to severely reduced RV systolic function - PTA patient was taking xarelto 15 mg daily, developed oral bleeding. Was later switched to eliqius 2.5 mg BID, also developed oral bleeding.  - CHADS-VASc 5, stared on IV heparin and patient denies any bleeding. Hopefully can transition back to PO apixaban prior to discharge if no recurrence of bleeding  Strep Bacteremia Septic Shock -ID following -On cefazolin -Planned for TEE Monday; high risk for anesthesia given RV dysfunction and pulmonary HTN; discussed with Dr. 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

## 2021-11-02 NOTE — Anesthesia Preprocedure Evaluation (Addendum)
Anesthesia Evaluation  Patient identified by MRN, date of birth, ID band Patient awake    Reviewed: Allergy & Precautions, NPO status , Patient's Chart, lab work & pertinent test results, reviewed documented beta blocker date and time   History of Anesthesia Complications Negative for: history of anesthetic complications  Airway Mallampati: III  TM Distance: >3 FB Neck ROM: Full    Dental  (+) Dental Advisory Given   Pulmonary asthma , sleep apnea ,    Pulmonary exam normal        Cardiovascular hypertension, Pt. on home beta blockers and Pt. on medications pulmonary hypertension+CHF  + dysrhythmias Atrial Fibrillation  Rhythm:Irregular Rate:Normal   '23 TTE - EF 55 to 60%. Mild left ventricular hypertrophy. Right ventricular volume overload. Right ventricular systolic function is severely reduced. The right ventricular size is severely enlarged. There is severely elevated  pulmonary artery systolic pressure. Mild to moderate TR. Mild As    Neuro/Psych PSYCHIATRIC DISORDERS Anxiety Depression negative neurological ROS     GI/Hepatic Neg liver ROS, hiatal hernia, GERD  Controlled,  Endo/Other  diabetes, Type 2, Insulin DependentMorbid obesity  Renal/GU CRFRenal disease     Musculoskeletal  (+) Arthritis ,   Abdominal   Peds  Hematology  (+) Blood dyscrasia, anemia ,  Thrombocytopenia     Anesthesia Other Findings   Reproductive/Obstetrics                            Anesthesia Physical Anesthesia Plan  ASA: 4  Anesthesia Plan: MAC   Post-op Pain Management: Minimal or no pain anticipated   Induction:   PONV Risk Score and Plan: 2 and Propofol infusion and Treatment may vary due to age or medical condition  Airway Management Planned: Nasal Cannula and Natural Airway  Additional Equipment: None  Intra-op Plan:   Post-operative Plan:   Informed Consent: I have reviewed the  patients History and Physical, chart, labs and discussed the procedure including the risks, benefits and alternatives for the proposed anesthesia with the patient or authorized representative who has indicated his/her understanding and acceptance.   Patient has DNR.  Discussed DNR with patient and Suspend DNR.     Plan Discussed with: CRNA and Anesthesiologist  Anesthesia Plan Comments:        Anesthesia Quick Evaluation

## 2021-11-02 NOTE — Progress Notes (Signed)
ANTICOAGULATION CONSULT NOTE - Follow Up Consult  Pharmacy Consult for Heparin Indication: atrial fibrillation  Allergies  Allergen Reactions   Albuterol Other (See Comments)    REACTION: Tachycardia- AFib   Epinephrine Other (See Comments)    Increases heart rate per patient.    Penicillins Other (See Comments)    REACTION: flushing' \\T'$ \ hot  Did it involve swelling of the face/tongue/throat, SOB, or low BP? No Did it involve sudden or severe rash/hives, skin peeling, or any reaction on the inside of your mouth or nose? No Did you need to seek medical attention at a hospital or doctor's office? No When did it last happen?    20 years ago   If all above answers are "NO", may proceed with cephalosporin use.   Ceftriaxone Other (See Comments)    Sweats   Citalopram Hydrobromide Other (See Comments)    Prolonged QT prolongation   Exenatide Other (See Comments)    Unknown reaction   Ketorolac Tromethamine Hives and Swelling    swelling in hands   Tizanidine Other (See Comments)    lethargic   Torsemide Swelling   Zoledronic Acid Other (See Comments)    Pt was hospitalized due to medication   Bee Venom Other (See Comments)    "makes me nervous"   Ivp Dye [Iodinated Contrast Media] Other (See Comments)    flushing   Keflex [Cephalexin] Other (See Comments)    Makes her feel warm; she was told never to take it.    Patient Measurements: Height: '5\' 9"'$  (175.3 cm) Weight: 135.7 kg (299 lb 2.6 oz) IBW/kg (Calculated) : 66.2 kg Heparin Dosing Weight: 99 kg  Vital Signs: Temp: 98.2 F (36.8 C) (08/13 0350) Temp Source: Axillary (08/13 0350) BP: 105/82 (08/13 0821) Pulse Rate: 83 (08/13 0821)  Labs: Recent Labs    10/31/21 0325 11/01/21 0313 11/02/21 0326  HGB 10.7* 10.1* 9.9*  HCT 32.7* 30.9* 31.5*  PLT 142* 218 123*  HEPARINUNFRC 0.31 0.32 0.37  CREATININE 2.82* 2.66* 2.61*     Estimated Creatinine Clearance: 28.1 mL/min (A) (by C-G formula based on SCr of 2.61  mg/dL (H)).  Assessment: 76 yo F presents with AMS/sepsis. Pt on Xarelto '15mg'$  in the past but changed to apixaban 2.'5mg'$  po BID back in July - stopped ~1 week PTA due to severe hemoptysis and knee hematoma/bleeding. Pharmacy consulted to start heparin for afib on 8/9 pm per cards recs. CHADS-VASc 5, may re-challenge with DOAC if Hgb remains stable. Apixaban should not still be affecting heparin levels. Heparin level today is therapeutic at 0.35, on 1500 units/hr units/hr. Hgb decreased slightly from 10.1 to 9.9, plt decreased to 123. No line issues or signs/symptoms of bleeding per RN.  Goal of Therapy:  Heparin level 0.3-0.7 units/ml Monitor platelets by anticoagulation protocol: Yes   Plan:  Continue heparin drip at 1500 units/hr Daily heparin level and CBC. Monitor for signs/symptoms of bleeding. Follow up anticoagulation plans per cardiology.  Jeneen Rinks, Pharm.D PGY1 Pharmacy Resident 11/02/2021 8:43 AM

## 2021-11-02 NOTE — Progress Notes (Signed)
ID Brief Note      Component 2 d ago  Specimen Description ABSCESS LEFT KNEE   Special Requests NONE   Gram Stain NO ORGANISMS SEEN  MODERATE WBC PRESENT, PREDOMINANTLY PMN   Culture NO GROWTH 2 DAYS NO ANAEROBES ISOLATED; CULTURE IN PROGRESS FOR 5 DAYS  Performed at [facility], [place] [address], [place], [place] [address]   Report Status PENDING   Resulting Agency [facility]      Rosiland Oz, MD Infectious Disease Physician [facility] [address] [place], [place] [address] Phone: [phone]  Fax: [phone]

## 2021-11-02 NOTE — Plan of Care (Signed)
  Problem: Education: Goal: Knowledge of General Education information will improve Description: Including pain rating scale, medication(s)/side effects and non-pharmacologic comfort measures Outcome: Progressing   Problem: Activity: Goal: Risk for activity intolerance will decrease Outcome: Progressing   Problem: Nutrition: Goal: Adequate nutrition will be maintained Outcome: Progressing   Problem: Coping: Goal: Level of anxiety will decrease Outcome: Progressing   

## 2021-11-02 NOTE — Progress Notes (Signed)
Right upper extremity venous duplex completed. Refer to "CV Proc" under chart review to view preliminary results.  11/02/2021 1:49 PM Kelby Aline., MHA, RVT, RDCS, RDMS

## 2021-11-03 ENCOUNTER — Inpatient Hospital Stay (HOSPITAL_COMMUNITY): Payer: Medicare HMO

## 2021-11-03 ENCOUNTER — Encounter (HOSPITAL_COMMUNITY): Payer: Self-pay | Admitting: Pulmonary Disease

## 2021-11-03 ENCOUNTER — Inpatient Hospital Stay (HOSPITAL_COMMUNITY): Payer: Medicare HMO | Admitting: Anesthesiology

## 2021-11-03 ENCOUNTER — Encounter (HOSPITAL_COMMUNITY)
Admission: EM | Disposition: A | Discharge status: Home Health | Payer: Self-pay | Source: Home / Self Care | Attending: Internal Medicine

## 2021-11-03 DIAGNOSIS — I50810 Right heart failure, unspecified: Secondary | ICD-10-CM | POA: Diagnosis not present

## 2021-11-03 DIAGNOSIS — R6521 Severe sepsis with septic shock: Secondary | ICD-10-CM | POA: Diagnosis not present

## 2021-11-03 DIAGNOSIS — N189 Chronic kidney disease, unspecified: Secondary | ICD-10-CM

## 2021-11-03 DIAGNOSIS — E1122 Type 2 diabetes mellitus with diabetic chronic kidney disease: Secondary | ICD-10-CM

## 2021-11-03 DIAGNOSIS — D631 Anemia in chronic kidney disease: Secondary | ICD-10-CM

## 2021-11-03 DIAGNOSIS — A419 Sepsis, unspecified organism: Secondary | ICD-10-CM | POA: Diagnosis not present

## 2021-11-03 DIAGNOSIS — I4891 Unspecified atrial fibrillation: Secondary | ICD-10-CM | POA: Diagnosis not present

## 2021-11-03 DIAGNOSIS — Z794 Long term (current) use of insulin: Secondary | ICD-10-CM

## 2021-11-03 DIAGNOSIS — J9601 Acute respiratory failure with hypoxia: Secondary | ICD-10-CM | POA: Diagnosis not present

## 2021-11-03 DIAGNOSIS — I38 Endocarditis, valve unspecified: Secondary | ICD-10-CM

## 2021-11-03 DIAGNOSIS — I130 Hypertensive heart and chronic kidney disease with heart failure and stage 1 through stage 4 chronic kidney disease, or unspecified chronic kidney disease: Secondary | ICD-10-CM

## 2021-11-03 HISTORY — PX: TEE WITHOUT CARDIOVERSION: SHX5443

## 2021-11-03 LAB — GLUCOSE, CAPILLARY
Glucose-Capillary: 160 mg/dL — ABNORMAL HIGH (ref 70–99)
Glucose-Capillary: 84 mg/dL (ref 70–99)
Glucose-Capillary: 158 mg/dL — ABNORMAL HIGH (ref 70–99)
Glucose-Capillary: 204 mg/dL — ABNORMAL HIGH (ref 70–99)
Glucose-Capillary: 211 mg/dL — ABNORMAL HIGH (ref 70–99)
Glucose-Capillary: 174 mg/dL — ABNORMAL HIGH (ref 70–99)

## 2021-11-03 LAB — BASIC METABOLIC PANEL
Anion gap: 11 (ref 5–15)
BUN: 95 mg/dL — ABNORMAL HIGH (ref 8–23)
CO2: 29 mmol/L (ref 22–32)
Calcium: 9.5 mg/dL (ref 8.9–10.3)
Chloride: 93 mmol/L — ABNORMAL LOW (ref 98–111)
Creatinine, Ser: 2.43 mg/dL — ABNORMAL HIGH (ref 0.44–1.00)
GFR, Estimated: 20 mL/min — ABNORMAL LOW (ref >60)
Glucose, Bld: 173 mg/dL — ABNORMAL HIGH (ref 70–99)
Potassium: 3.9 mmol/L (ref 3.5–5.1)
Sodium: 133 mmol/L — ABNORMAL LOW (ref 135–145)

## 2021-11-03 LAB — CBC
HCT: 31.1 % — ABNORMAL LOW (ref 36.0–46.0)
Hemoglobin: 10 g/dL — ABNORMAL LOW (ref 12.0–15.0)
MCH: 28.7 pg (ref 26.0–34.0)
MCHC: 32.2 g/dL (ref 30.0–36.0)
MCV: 89.1 fL (ref 80.0–100.0)
Platelets: 118 10*3/uL — ABNORMAL LOW (ref 150–400)
RBC: 3.49 MIL/uL — ABNORMAL LOW (ref 3.87–5.11)
RDW: 16.7 % — ABNORMAL HIGH (ref 11.5–15.5)
WBC: 10.6 10*3/uL — ABNORMAL HIGH (ref 4.0–10.5)
nRBC: 0 % (ref 0.0–0.2)

## 2021-11-03 LAB — HEPARIN LEVEL (UNFRACTIONATED): Heparin Unfractionated: 0.42 IU/mL (ref 0.30–0.70)

## 2021-11-03 SURGERY — ECHOCARDIOGRAM, TRANSESOPHAGEAL
Anesthesia: Monitor Anesthesia Care

## 2021-11-03 MED ORDER — SODIUM CHLORIDE 0.9 % IV SOLN
INTRAVENOUS | Status: AC | PRN
  Administered 2021-11-03: 500 mL via INTRAVENOUS

## 2021-11-03 MED ORDER — ETOMIDATE 2 MG/ML IV SOLN
INTRAVENOUS | Status: DC | PRN
  Administered 2021-11-03 (×2): 2 mg via INTRAVENOUS
  Administered 2021-11-03: 4 mg via INTRAVENOUS

## 2021-11-03 MED ORDER — SODIUM CHLORIDE 0.9 % IV SOLN: INTRAVENOUS | Status: DC | PRN

## 2021-11-03 MED ORDER — SODIUM CHLORIDE 0.9 % IV SOLN: INTRAVENOUS | Status: DC

## 2021-11-03 MED ORDER — LIDOCAINE 2% (20 MG/ML) 5 ML SYRINGE
INTRAMUSCULAR | Status: DC | PRN
  Administered 2021-11-03: 40 mg via INTRAVENOUS

## 2021-11-03 MED ORDER — PHENYLEPHRINE 80 MCG/ML (10ML) SYRINGE FOR IV PUSH (FOR BLOOD PRESSURE SUPPORT)
PREFILLED_SYRINGE | INTRAVENOUS | Status: DC | PRN
  Administered 2021-11-03: 80 ug via INTRAVENOUS

## 2021-11-03 MED ORDER — PROPOFOL 10 MG/ML IV BOLUS
INTRAVENOUS | Status: DC | PRN
  Administered 2021-11-03: 20 mg via INTRAVENOUS

## 2021-11-03 NOTE — Plan of Care (Signed)
  Problem: Education: Goal: Knowledge of General Education information will improve Description: Including pain rating scale, medication(s)/side effects and non-pharmacologic comfort measures Outcome: Progressing   Problem: Activity: Goal: Risk for activity intolerance will decrease Outcome: Progressing   Problem: Coping: Goal: Level of anxiety will decrease Outcome: Progressing   Problem: Safety: Goal: Ability to remain free from injury will improve Outcome: Progressing   Problem: Skin Integrity: Goal: Risk for impaired skin integrity will decrease Outcome: Progressing

## 2021-11-03 NOTE — CV Procedure (Signed)
    TRANSESOPHAGEAL ECHOCARDIOGRAM   NAME:  Kristine [name]   MRN: [id number] DOB:  June 02, 1945   ADMIT DATE: 10/28/2021  INDICATIONS:  Bacteremia  PROCEDURE:   Informed consent was obtained prior to the procedure. The risks, benefits and alternatives for the procedure were discussed and the patient comprehended these risks.  Risks include, but are not limited to, cough, sore throat, vomiting, nausea, somnolence, esophageal and stomach trauma or perforation, bleeding, low blood pressure, aspiration, pneumonia, infection, trauma to the teeth and death.    After a procedural time-out, the patient was sedated by the anesthesia service.  The transesophageal probe was inserted in the esophagus and stomach without difficulty and multiple views were obtained.    COMPLICATIONS:    There were no immediate complications.  FINDINGS:  LEFT VENTRICLE: EF = 55%. No regional wall motion abnormalities.  RIGHT VENTRICLE: Markedly dilated. Severely HK  LEFT ATRIUM: Mildly dilated  LEFT ATRIAL APPENDAGE: No thrombus.   RIGHT ATRIUM: Massively dilated  AORTIC VALVE:  Trileaflet.  Mild calcification No AI/AS. No vegetation.   MITRAL VALVE:    Normal. Mild posterior MR. No vegetation.   TRICUSPID VALVE: Normal. Severe TR. No vegetation.   PULMONIC VALVE: Normal. Trivial PR. No vegetation.   INTERATRIAL SEPTUM: No PFO or ASD. Bulging R to L  PERICARDIUM: Moderate effusion around RA. No tamponade  DESCENDING AORTA: Mild plaque   CONCLUSION:  No TEE evidence of endocarditis  Benay Spice 8:36 AM

## 2021-11-03 NOTE — Anesthesia Procedure Notes (Signed)
Procedure Name: MAC Date/Time: 11/03/2021 8:00 AM  Performed by: Lorie Phenix, CRNAPre-anesthesia Checklist: Patient identified, Emergency Drugs available, Suction available and Patient being monitored Patient Re-evaluated:Patient Re-evaluated prior to induction Oxygen Delivery Method: Nasal cannula Placement Confirmation: positive ETCO2

## 2021-11-03 NOTE — Progress Notes (Signed)
Occupational Therapy Treatment Patient Details Name: Kristine [name] MRN: [id number] DOB: 1945/05/01 Today's Date: 11/03/2021   History of present illness 76 year old woman who presented [facility] 8/8 with fatigue and AMS. PMHx significant for HTN, HLD, AF (previously on Eliquis), mild AS, CHF with cardiac amyloidosis (TTR, diagnosed 10/2021), COPD with chronic bronchitis, OSA, T2DM, hypothyroidism, CKD stage IV, GERD, depression/anxiety.  Recent hospitalization 6/23 with [facility] and PT.   OT comments  Patient received in supine with family present. Patient stated she was fatigued from procedure earlier but was willing to participate with OT. Patient was min assist to get to EOB and performed grooming tasks with min assist to complete.  Patient performed sit to stand from EOB to RW with mod  assist and performed side stepping towards [facility] with min assist. Patient was mod assist to return to supine. Acute OT to continue to follow.    Recommendations for follow up therapy are one component of a multi-disciplinary discharge planning process, led by the attending physician.  Recommendations may be updated based on patient status, additional functional criteria and insurance authorization.    Follow Up Recommendations  Home health OT    Assistance Recommended at Discharge Intermittent Supervision/Assistance  Patient can return home with the following  A lot of help with bathing/dressing/bathroom;A little help with walking and/or transfers;Help with stairs or ramp for entrance;Assist for transportation;Assistance with cooking/housework   Equipment Recommendations  None recommended by OT    Recommendations for Other Services      Precautions / Restrictions Precautions Precautions: Fall Precaution Comments: open area to L plantar surface near great toe. Restrictions Weight Bearing Restrictions: No Other Position/Activity Restrictions: Has shoes in room that need to be on when OOB        Mobility Bed Mobility Overal bed mobility: Needs Assistance Bed Mobility: Sidelying to Sit, Sit to Supine   Sidelying to sit: Min assist, HOB elevated   Sit to supine: Mod assist   General bed mobility comments: hand held assist to get to EOB and assistance with BLEs to return to supine    Transfers Overall transfer level: Needs assistance Equipment used: Rolling walker (2 wheels) Transfers: Sit to/from Stand Sit to Stand: Mod assist, From elevated surface           General transfer comment: sit to stands performed from EOB to RW with mod assist     Balance Overall balance assessment: Needs assistance Sitting-balance support: Feet supported, Bilateral upper extremity supported Sitting balance-Leahy Scale: Fair Sitting balance - Comments: supervision due to lethargic   Standing balance support: Bilateral upper extremity supported, Reliant on assistive device for balance Standing balance-Leahy Scale: Poor Standing balance comment: reliant on RW for balance                           ADL either performed or assessed with clinical judgement   ADL Overall ADL's : Needs assistance/impaired     Grooming: Wash/dry hands;Wash/dry face;Brushing hair;Minimal assistance;Sitting Grooming Details (indicate cue type and reason): required assistance to complete combing hair                               General ADL Comments: Patient unable to stand for grooming due to fatigue    Extremity/Trunk Assessment              Vision       Perception  Praxis      Cognition Arousal/Alertness: Lethargic Behavior During Therapy: WFL for tasks assessed/performed Overall Cognitive Status: Impaired/Different from baseline Area of Impairment: Problem solving                 Orientation Level: Disoriented to, Time     Following Commands: Follows one step commands with increased time, Follows one step commands consistently     Problem  Solving: Slow processing General Comments: was lethargic from being off unit for procedure earlier today        Exercises      Shoulder Instructions       General Comments      Pertinent Vitals/ Pain       Pain Assessment Pain Assessment: Faces Faces Pain Scale: Hurts a little bit Pain Location: back Pain Descriptors / Indicators: Grimacing, Guarding Pain Intervention(s): Limited activity within patient's tolerance, Monitored during session, Repositioned  Home Living                                          Prior Functioning/Environment              Frequency  Min 2X/week        Progress Toward Goals  OT Goals(current goals can now be found in the care plan section)  Progress towards OT goals: Progressing toward goals  Acute Rehab OT Goals Patient Stated Goal: get better OT Goal Formulation: With patient Time For Goal Achievement: 11/13/21 Potential to Achieve Goals: Good ADL Goals Pt Will Perform Grooming: with supervision;standing;sitting Pt Will Transfer to Toilet: with supervision;ambulating;regular height toilet Pt/caregiver will Perform Home Exercise Program: Increased strength;Both right and left upper extremity;With theraband;With written HEP provided  Plan Discharge plan remains appropriate    Co-evaluation                 AM-PAC OT "6 Clicks" Daily Activity     Outcome Measure   Help from another person eating meals?: None Help from another person taking care of personal grooming?: None Help from another person toileting, which includes using toliet, bedpan, or urinal?: A Lot Help from another person bathing (including washing, rinsing, drying)?: A Lot Help from another person to put on and taking off regular upper body clothing?: A Little Help from another person to put on and taking off regular lower body clothing?: A Lot 6 Click Score: 17    End of Session Equipment Utilized During Treatment: Rolling walker (2  wheels)  OT Visit Diagnosis: Unsteadiness on feet (R26.81);Muscle weakness (generalized) (M62.81)   Activity Tolerance Patient limited by lethargy   Patient Left in bed;with call bell/phone within reach;with family/visitor present   Nurse Communication Mobility status        Time: 1325-1345 OT Time Calculation (min): 20 min  Charges: OT General Charges $OT Visit: 1 Visit OT Treatments $Self Care/Home Management : 8-22 mins  Lodema Hong, [place]  Office [place] 11/03/2021, 2:49 PM

## 2021-11-03 NOTE — Transfer of Care (Signed)
Immediate Anesthesia Transfer of Care Note  Patient: Kristine [name]  Procedure(s) Performed: TRANSESOPHAGEAL ECHOCARDIOGRAM (TEE)  Patient Location: PACU  Anesthesia Type:MAC  Level of Consciousness: drowsy  Airway & Oxygen Therapy: Patient Spontanous Breathing  Post-op Assessment: Report given to RN and Post -op Vital signs reviewed and stable  Post vital signs: Reviewed and stable  Last Vitals:  Vitals Value Taken Time  BP 100/62 11/03/21 0825  Temp    Pulse 91 11/03/21 0828  Resp 12 11/03/21 0828  SpO2 99 % 11/03/21 0828  Vitals shown include unvalidated device data.  Last Pain:  Vitals:   11/03/21 0723  TempSrc: Temporal  PainSc: 0-No pain      Patients Stated Pain Goal: 1 (62/83/15 1761)  Complications: No notable events documented.

## 2021-11-03 NOTE — Progress Notes (Signed)
ANTICOAGULATION CONSULT NOTE - Follow Up Consult  Pharmacy Consult for Heparin Indication: atrial fibrillation  Allergies  Allergen Reactions   Albuterol Other (See Comments)    REACTION: Tachycardia- AFib   Epinephrine Other (See Comments)    Increases heart rate per patient.    Penicillins Other (See Comments)    REACTION: flushing' \\T'$ \ hot  Did it involve swelling of the face/tongue/throat, SOB, or low BP? No Did it involve sudden or severe rash/hives, skin peeling, or any reaction on the inside of your mouth or nose? No Did you need to seek medical attention at a hospital or doctor's office? No When did it last happen?    20 years ago   If all above answers are "NO", may proceed with cephalosporin use.   Ceftriaxone Other (See Comments)    Sweats   Citalopram Hydrobromide Other (See Comments)    Prolonged QT prolongation   Exenatide Other (See Comments)    Unknown reaction   Ketorolac Tromethamine Hives and Swelling    swelling in hands   Tizanidine Other (See Comments)    lethargic   Torsemide Swelling   Zoledronic Acid Other (See Comments)    Pt was hospitalized due to medication   Bee Venom Other (See Comments)    "makes me nervous"   Ivp Dye [Iodinated Contrast Media] Other (See Comments)    flushing   Keflex [Cephalexin] Other (See Comments)    Makes her feel warm; she was told never to take it.    Patient Measurements: Height: '5\' 9"'$  (175.3 cm) Weight: 135 kg (297 lb 9.9 oz) IBW/kg (Calculated) : 66.2 kg Heparin Dosing Weight: 99 kg  Vital Signs: Temp: 97.8 F (36.6 C) (08/14 1149) Temp Source: Oral (08/14 1149) BP: 100/58 (08/14 1149) Pulse Rate: 82 (08/14 1149)  Labs: Recent Labs    11/01/21 0313 11/02/21 0326 11/03/21 0938  HGB 10.1* 9.9* 10.0*  HCT 30.9* 31.5* 31.1*  PLT 218 123* 118*  HEPARINUNFRC 0.32 0.37 0.42  CREATININE 2.66* 2.61* 2.43*     Estimated Creatinine Clearance: 30 mL/min (A) (by C-G formula based on SCr of 2.43 mg/dL  (H)).  Assessment: 76 yo F presents with AMS/sepsis. Pt on Xarelto '15mg'$  in the past but changed to apixaban 2.'5mg'$  po BID back in July - stopped ~1 week PTA due to severe hemoptysis and knee hematoma/bleeding. Pharmacy consulted to start heparin for afib on 8/9 pm per cards recs. CHADS-VASc 5, may re-challenge with DOAC if Hgb remains stable. Apixaban should not still be affecting heparin levels. Heparin level today is therapeutic at 0.42, on 1500 units/hr units/hr. Hgb 10.0 stable, plt decreased to 118. No active bleeding reported.  Goal of Therapy:  Heparin level 0.3-0.7 units/ml Monitor platelets by anticoagulation protocol: Yes   Plan:  Continue heparin drip at 1500 units/hr Daily heparin level and CBC. Monitor for signs/symptoms of bleeding. Follow up anticoagulation plans per cardiology.   Nicole Cella, RPh Clinical Pharmacist 11/03/2021 2:00 PM

## 2021-11-03 NOTE — Anesthesia Postprocedure Evaluation (Signed)
Anesthesia Post Note  Patient: Kristine [name]  Procedure(s) Performed: TRANSESOPHAGEAL ECHOCARDIOGRAM (TEE)     Patient location during evaluation: PACU Anesthesia Type: MAC Level of consciousness: awake and alert Pain management: pain level controlled Vital Signs Assessment: post-procedure vital signs reviewed and stable Respiratory status: spontaneous breathing, nonlabored ventilation and respiratory function stable Cardiovascular status: stable and blood pressure returned to baseline Anesthetic complications: no   No notable events documented.  Last Vitals:  Vitals:   11/03/21 0835 11/03/21 0845  BP: 106/61 105/68  Pulse: 90 86  Resp: 13 13  Temp:    SpO2: 97% 93%    Last Pain:  Vitals:   11/03/21 0845  TempSrc:   PainSc: [place] Elsa Ploch

## 2021-11-03 NOTE — Progress Notes (Signed)
Progress Note  Patient Name: Kristine [name] Date of Encounter: 11/03/2021  Primary Cardiologist:   Glori Bickers, MD   Subjective   Denies acute SOB or chest pain.   Inpatient Medications    Scheduled Meds:  allopurinol  300 mg Oral q AM   DULoxetine  30 mg Oral Daily   insulin aspart  0-20 Units Subcutaneous TID WC   insulin aspart  0-5 Units Subcutaneous QHS   insulin glargine-yfgn  25 Units Subcutaneous Daily   levothyroxine  88 mcg Oral q morning   metoprolol tartrate  12.5 mg Oral BID   pregabalin  50 mg Oral BID   Vitamin D (Ergocalciferol)  50,000 Units Oral Q7 days   Continuous Infusions:   ceFAZolin (ANCEF) IV 2 g (11/03/21 1019)   furosemide 100 mg (11/03/21 1146)   heparin 1,500 Units/hr (11/03/21 0755)   PRN Meds: acetaminophen, ALPRAZolam, diphenhydrAMINE, docusate sodium, levalbuterol, mineral oil-hydrophilic petrolatum, mouth rinse, polyethylene glycol, traMADol   Vital Signs    Vitals:   11/03/21 0830 11/03/21 0835 11/03/21 0845 11/03/21 1149  BP: 100/62 106/61 105/68 (!) 100/58  Pulse: 86 90 86 82  Resp: '12 13 13 18  '$ Temp: (!) 97.5 F (36.4 C)   97.8 F (36.6 C)  TempSrc:    Oral  SpO2: 97% 97% 93% 98%  Weight:      Height:        Intake/Output Summary (Last 24 hours) at 11/03/2021 1232 Last data filed at 11/03/2021 0819 Gross per 24 hour  Intake 100 ml  Output 1900 ml  Net -1800 ml   Filed Weights   11/01/21 1500 11/02/21 0445 11/03/21 0417  Weight: 134.8 kg 135.7 kg 135 kg     ECG    NA - Personally Reviewed  Physical Exam   GEN: No acute distress.   Neck:    Positive JVD Cardiac: RRR, 2/6 apical systolic murmurs, rubs, or gallops.  Respiratory: Clear to auscultation bilaterally. GI: Soft, nontender, non-distended  MS:    Moderately severe edema edema; No deformity. Neuro:  Nonfocal  Psych: Normal affect   Labs    Chemistry Recent Labs  Lab 10/28/21 1920 10/28/21 1923 10/29/21 0439 10/29/21 1009  11/01/21 0313 11/02/21 0326 11/03/21 0938  NA 134*   < > 134*   < > 134* 135 133*  K 2.7*   < > 3.0*   < > 3.4* 3.6 3.9  CL 91*  --  93*   < > 95* 94* 93*  CO2 28  --  27   < > '25 30 29  '$ GLUCOSE 117*  --  82   < > 115* 172* 173*  BUN 89*  --  85*   < > 102* 99* 95*  CREATININE 2.86*  --  2.87*   < > 2.66* 2.61* 2.43*  CALCIUM 9.6  --  8.8*   < > 9.4 9.6 9.5  PROT 7.8  --  5.7*  --   --   --   --   ALBUMIN 4.0  --  2.9*  --   --   --   --   AST 21  --  16  --   --   --   --   ALT 9  --  9  --   --   --   --   ALKPHOS 110  --  76  --   --   --   --   BILITOT 2.2*  --  2.1*  --   --   --   --   GFRNONAA 17*  --  17*   < > 18* 19* 20*  ANIONGAP 15  --  14   < > '14 11 11   '$ < > = values in this interval not displayed.     Hematology Recent Labs  Lab 11/01/21 0313 11/02/21 0326 11/03/21 0938  WBC 11.8* 9.8 10.6*  RBC 3.50* 3.51* 3.49*  HGB 10.1* 9.9* 10.0*  HCT 30.9* 31.5* 31.1*  MCV 88.3 89.7 89.1  MCH 28.9 28.2 28.7  MCHC 32.7 31.4 32.2  RDW 16.7* 16.6* 16.7*  PLT 218 123* 118*    Cardiac EnzymesNo results for input(s): "TROPONINI" in the last 168 hours. No results for input(s): "TROPIPOC" in the last 168 hours.   BNP Recent Labs  Lab 10/28/21 1945  BNP 276.7*     DDimer No results for input(s): "DDIMER" in the last 168 hours.   Radiology    VAS [place] UPPER EXTREMITY VENOUS DUPLEX  Result Date: 11/02/2021 UPPER VENOUS STUDY  Patient Name:  Kristine [name]  Date of Exam:   11/02/2021 Medical Rec #: [id number]             Accession #:    [id number] Date of Birth: 09/02/1945            Patient Gender: F Patient Age:   76 years Exam Location:  [facility] Procedure:      VAS [place] UPPER EXTREMITY VENOUS DUPLEX Referring Phys: HEATHER PEMBERTON --------------------------------------------------------------------------------  Indications: Edema Comparison Study: No prior study Performing Technologist: Maudry Mayhew MHA, RDMS, RVT, RDCS  Examination Guidelines: A  complete evaluation includes B-mode imaging, spectral Doppler, color Doppler, and power Doppler as needed of all accessible portions of each vessel. Bilateral testing is considered an integral part of a complete examination. Limited examinations for reoccurring indications may be performed as noted.  Right Findings: +----------+------------+---------+-----------+----------+-------+ RIGHT     CompressiblePhasicitySpontaneousPropertiesSummary +----------+------------+---------+-----------+----------+-------+ IJV           Full                                          +----------+------------+---------+-----------+----------+-------+ Subclavian    Full                                          +----------+------------+---------+-----------+----------+-------+ Axillary      None                 No                Acute  +----------+------------+---------+-----------+----------+-------+ Brachial      None                 No                Acute  +----------+------------+---------+-----------+----------+-------+ Radial        Full                                          +----------+------------+---------+-----------+----------+-------+ Ulnar         Full                                          +----------+------------+---------+-----------+----------+-------+  Cephalic      Full                                          +----------+------------+---------+-----------+----------+-------+ Basilic       Full                                          +----------+------------+---------+-----------+----------+-------+  Left Findings: +----------+------------+---------+-----------+----------+-------+ LEFT      CompressiblePhasicitySpontaneousPropertiesSummary +----------+------------+---------+-----------+----------+-------+ Subclavian               Yes       Yes                      +----------+------------+---------+-----------+----------+-------+   Summary:  Right: No evidence of superficial vein thrombosis in the upper extremity. Findings consistent with acute deep vein thrombosis involving the right axillary vein and right brachial veins.  Left: No evidence of thrombosis in the subclavian.  *See table(s) above for measurements and observations.    Preliminary     Cardiac Studies   TEE  LEFT VENTRICLE: EF = 55%. No regional wall motion abnormalities.  RIGHT VENTRICLE: Markedly dilated. Severely HK   LEFT ATRIUM: Mildly dilated   LEFT ATRIAL APPENDAGE: No thrombus.    RIGHT ATRIUM: Massively dilated   AORTIC VALVE:  Trileaflet.  Mild calcification No AI/AS. No vegetation.    MITRAL VALVE:    Normal. Mild posterior MR. No vegetation.    TRICUSPID VALVE: Normal. Severe TR. No vegetation.    PULMONIC VALVE: Normal. Trivial PR. No vegetation.    INTERATRIAL SEPTUM: No PFO or ASD. Bulging R to L   PERICARDIUM: Moderate effusion around RA. No tamponade   DESCENDING AORTA: Mild plaque    Patient Profile     76 y.o. female with a hx of persistent atrial fibrillation, CHF, CKD, DM, hypertension, hypothyroidism, hyperlipidemia, sleep apnea who is being seen for the evaluation of CHF   Assessment & Plan    Acute on chronic HF:  Right sided with preserved LV EF.  TEE as above.  Right ventricular failure as above.  Plan limited by the physiology of decreased preload.  Continue current IV diuresis and emphasized with the family keeping the feet elevated.  Cannot wear compression stockings at this moment because of leg wounds.    Chronic atrial fib:  On heparin at present.  Likely switch back to PO Eliquis in AM.    For questions or updates, please contact [place] Please consult [email or web address] for contact info under Cardiology/STEMI.   Signed, Minus Breeding, MD  11/03/2021, 12:32 PM

## 2021-11-03 NOTE — Progress Notes (Signed)
PT Cancellation Note  Patient Details Name: Kristine [name] MRN: [id number] DOB: 1945/06/19   Cancelled Treatment:    Reason Eval/Treat Not Completed: Patient at procedure or test/unavailable  Stacie Glaze, PT DPT Acute Rehabilitation Services Pager [phone]  Office [phone]  [place] 11/03/2021, 9:11 AM

## 2021-11-03 NOTE — Interval H&P Note (Signed)
History and Physical Interval Note:  11/03/2021 7:33 AM  Kristine [name]  has presented today for surgery, with the diagnosis of bacteremia.  The various methods of treatment have been discussed with the patient and family. After consideration of risks, benefits and other options for treatment, the patient has consented to  Procedure(s): TRANSESOPHAGEAL ECHOCARDIOGRAM (TEE) (N/A) as a surgical intervention.  The patient's history has been reviewed, patient examined, no change in status, stable for surgery.  I have reviewed the patient's chart and labs.  Questions were answered to the patient's satisfaction.     Dimple Bastyr

## 2021-11-03 NOTE — Progress Notes (Signed)
   11/03/21 1045  Clinical Encounter Type  Visited With Patient;Family  Visit Type Initial;Spiritual support  Referral From Nurse  Consult/Referral To Chaplain    In follow-up on Spiritual Consult, Chaplain visited with patient and rendered prayer as requested. Chaplain spoke with the patient's husband Brooke Bonito.)  and daughter who were also in the room. The family is very conscious of the health challenges associated with their loved one and hope for recovery.   [place], Resident Chaplain [phone]

## 2021-11-03 NOTE — Progress Notes (Signed)
Pt off the floor for TEE.

## 2021-11-03 NOTE — Progress Notes (Signed)
PROGRESS NOTE    Kristine [name]  FVC:[id number] DOB: 12-Jun-1945 DOA: 10/28/2021 PCP: Prince Solian, MD    Chief Complaint  Patient presents with   Altered Mental Status    Brief Narrative:   76 year old woman who presented to [facility] 8/8 with fatigue and AMS. PMHx significant for HTN, HLD, AF (previously on Eliquis), mild AS, CHF with cardiac amyloidosis (TTR, diagnosed 10/2021), COPD with chronic bronchitis, OSA, T2DM, hypothyroidism, CKD stage IV, GERD, depression/anxiety.   Per chart review/patient's husband, patient returned from [place] scan 8/8 and appeared extremely weak/fatigued and unable to get from the car to the house. Shortly after arriving home, patient c/o chills/being "freezing" and progressively became altered with increasing confusion and tachypnea. Tmax 101F measured at home and Tylenol was given. Of note, patient has had longstanding sores on bilateral feet (slow to heal in the setting of DM) and L knee (occurred s/p hematoma drainage).   On ED arrival, patient was febrile to 101.12F, tachycardic to 125, hypoxic to 70s on RA, hypotensive with BP 91/45 and altered. Labs were notable for WBC 26, H&H/Plt/INR WNL, Na 134, K 2.7, BUN 89/Cr 2.86, Tbili 2.2, LA 2.8, BNP 276, Trop 53. VBG demonstrated pH 7.398, pCO2 50.7. UA was unremarkable and BCx were obtained. CT Head NAICA, +atrophy and chronic microvascular disease. CT Chest/A/P without active CP disease, changes c/w cirrhosis, sigmoid diverticulitis and anasarca. XR of L knee was negative for acute abnormality and XR bilateral feet were obtained (L negative, R ?gouty changes at base of proximal phalanx of 1st digit, no definite osteomyelitis). Broad spectrum antibiotics were initiated and peripheral NE started for pressor support.   Given patient's ongoing hypotension, fever and AMS with concern for septic shock, patient was transferred to [facility] for further evaluation and workup. Patient admistted to ICU 10/29/2021    Assessment  & Plan:   Principal Problem:   Septic shock ([place]) Active Problems:   Diabetes mellitus, type 2 (HCC)   CKD (chronic kidney disease) stage 4, GFR 15-29 ml/min (HCC)   Essential hypertension   Pulmonary hypertension (HCC)   Persistent atrial fibrillation (HCC)   Paroxysmal atrial fibrillation (HCC)   Pressure injury of skin  Septic shock secondary to be strep bacteremia -Sepsis present on admission, hypotensive requiring pressors, leukocytosis, elevated lactic acid -Blood culture growing group be Streptococcus -ID input greatly appreciated, continue with IV cefazolin -TEE tomorrow per cardiology. -Orthopedic/plastic surgery consult greatly appreciated, no significant concern of left knee infection at this point -Fluid collection in left knee status post aspiration by IR, continue to follow on cultures, so far no growth to date -Surveillance blood cultures 8/11 remains with no growth to date -Antibiotics management per ID, during final recommendations, will need to avoid right upper extremity if she needs a PICC line due to acute DVT.  Atrial fibrillation History of persistent AF. Previously on Eliquis. Hx of amio toxicity in 2017 Management per cardiology, she did require Cardizem drip initially, -Heparin GTT for now, transition to Eliquis once stable  Acute on chronic right-sided heart failure Chronic diastolic CHF Acute on chronic right-sided heart failure Cardiac amyloidosis, TTR Pulm HTN Mild AS HTN HLD Echo 08/2021 with EF 55-60%, no RWMAs, mild LVH, D-shaped septum in diastole c/w severely elevated PASP, reduced RV function. -Significantly volume overload, diuresis per cardiology, Lasix increased to 100 mg IV 3 times daily, and she is receiving metolazone as well.   -She is -1.5 L over last 24 hours, but she remains positive balance during this hospital  stay despite aggressive diuresis -No ACE/ARB/ARNI, due to poor renal function   Hypokalemia -monitor and  replete  especially she is on IV diuresis  Acute metabolic encephalopathy -Due to infection, CT head with no acute findings, ammonia is 38, urine analysis within normal limit -Significantly improved -Now mentation has improved we will resume Cymbalta and Lyrica  Longstanding ulcerations of BLE (feet) L knee wound, nonhealing -underwent drainage of left knee hematoma and placement of drain 3/20203 by Dr. Rae Roam evacuation by Dr. Marla Roe -MRI with no evidence of infection, and bilateral feet or left knee,   AKI on CKD stage IV P: -repleted K; repeat bmp pending -Trend BMP / urinary output -Replace electrolytes as indicated -Avoid nephrotoxic agents, ensure adequate renal perfusion   Hypoglycemia T2DM Peripheral neuropathy -She is with significant insulin requirement at home, CBG started to increase will resume at a low dose Semglee and insulin sliding scale -on Cymbalta and Lyrica   Acute hypoxemic respiratory failure/insufficiency COPD with chronic bronchitis OSA: no cpap at home P: -continue to wean O2 for sats >92% -cpap qhs when less altered and safe to use -prn xopenex for wheezing   Atrial fibrillation History of persistent AF. Previously on Eliquis. Hx of amio toxicity in 2017 Management per cardiology, remains on heparin GTT, hopefully can be transitioned to Eliquis once no procedures are anticipated . -Initially requiring Cardizem drip, currently on po metoprolol.  CHF Chronic diastolic CHF Acute on chronic right-sided heart failure Cardiac amyloidosis, TTR Pulm HTN Mild AS HTN HLD Echo 08/2021 with EF 55-60%, no RWMAs, mild LVH, D-shaped septum in diastole c/w severely elevated PASP, reduced RV function. Management per cardiology, on IV Lasix, no ACE/ARB due to poor renal function   GERD - PPI when able to take PO   Gout Resume allopurinol   Hypothyroidism - Resume home synthroid when able to take po   Depression Anxiety P: - hold  xanax  Morbid obesity Body mass index is 43.95 kg/m.   Acute right upper extremity DVT -As of DVT on venous Dopplers, she is on anticoagulation.     DVT prophylaxis: Heparin GTT Code Status: Partial Family Communication: D/W husband at bedside on daily basis Disposition:   Status is: Inpatient    Consultants:  PCCM ID plastic surgery Orthopedic     Subjective:  No significant events overnight, she denies any complaints today  Objective: Vitals:   11/03/21 0830 11/03/21 0835 11/03/21 0845 11/03/21 1149  BP: 100/62 106/61 105/68 (!) 100/58  Pulse: 86 90 86 82  Resp: '12 13 13 18  '$ Temp: (!) 97.5 F (36.4 C)   97.8 F (36.6 C)  TempSrc:    Oral  SpO2: 97% 97% 93% 98%  Weight:      Height:        Intake/Output Summary (Last 24 hours) at 11/03/2021 1234 Last data filed at 11/03/2021 0819 Gross per 24 hour  Intake 100 ml  Output 1900 ml  Net -1800 ml   Filed Weights   11/01/21 1500 11/02/21 0445 11/03/21 0417  Weight: 134.8 kg 135.7 kg 135 kg    Examination:   Awake Alert, Oriented X 3, No new F.N deficits, Normal affect Symmetrical Chest wall movement, Good air movement bilaterally, CTAB RRR,No Gallops,Rubs or new Murmurs, No Parasternal Heave +ve B.Sounds, Abd Soft, No tenderness, No rebound - guarding or rigidity. No Cyanosis, Clubbing, she has right upper extremity edema, and bilateral lower extremity edema.   Data Reviewed: I have personally reviewed following labs and imaging  studies  CBC: Recent Labs  Lab 10/28/21 1920 10/28/21 1923 10/29/21 0201 10/29/21 0439 10/30/21 0058 10/31/21 0325 11/01/21 0313 11/02/21 0326 11/03/21 0938  WBC 26.0*  --  40.6*   < > 24.3* 15.8* 11.8* 9.8 10.6*  NEUTROABS 24.0*  --  37.3*  --   --   --   --   --   --   HGB 11.8*   < > 9.3*   < > 10.7* 10.7* 10.1* 9.9* 10.0*  HCT 36.6   < > 29.0*   < > 32.4* 32.7* 30.9* 31.5* 31.1*  MCV 88.8  --  89.5   < > 87.6 87.4 88.3 89.7 89.1  PLT 192  --  138*   < >  141* 142* 218 123* 118*   < > = values in this interval not displayed.    Basic Metabolic Panel: Recent Labs  Lab 10/28/21 1944 10/29/21 0439 10/29/21 1009 10/30/21 0058 10/31/21 0325 11/01/21 0313 11/02/21 0326 11/03/21 0938  NA  --  134*   < > 132* 130* 134* 135 133*  K  --  3.0*   < > 4.2 3.4* 3.4* 3.6 3.9  CL  --  93*   < > 93* 93* 95* 94* 93*  CO2  --  27   < > '26 23 25 30 29  '$ GLUCOSE  --  82   < > 200* 240* 115* 172* 173*  BUN  --  85*   < > 90* 101* 102* 99* 95*  CREATININE  --  2.87*   < > 2.98* 2.82* 2.66* 2.61* 2.43*  CALCIUM  --  8.8*   < > 9.2 9.1 9.4 9.6 9.5  MG 2.4 2.3  --   --   --   --   --   --   PHOS  --  4.8*  --   --   --   --   --   --    < > = values in this interval not displayed.    GFR: Estimated Creatinine Clearance: 30 mL/min (A) (by C-G formula based on SCr of 2.43 mg/dL (H)).  Liver Function Tests: Recent Labs  Lab 10/28/21 1920 10/29/21 0439  AST 21 16  ALT 9 9  ALKPHOS 110 76  BILITOT 2.2* 2.1*  PROT 7.8 5.7*  ALBUMIN 4.0 2.9*    CBG: Recent Labs  Lab 11/02/21 1533 11/02/21 2117 11/03/21 0825 11/03/21 0907 11/03/21 1148  GLUCAP 197* 206* 160* 158* 204*     Recent Results (from the past 240 hour(s))  SARS Coronavirus 2 by RT PCR (hospital order, performed in [facility] hospital lab) *cepheid single result test* Anterior Nasal Swab     Status: None   Collection Time: 10/28/21  6:50 PM   Specimen: Anterior Nasal Swab  Result Value Ref Range Status   SARS Coronavirus 2 by RT PCR NEGATIVE NEGATIVE Final    Comment: (NOTE) SARS-CoV-2 target nucleic acids are NOT DETECTED.  The SARS-CoV-2 RNA is generally detectable in upper and lower respiratory specimens during the acute phase of infection. The lowest concentration of SARS-CoV-2 viral copies this assay can detect is 250 copies / mL. A negative result does not preclude SARS-CoV-2 infection and should not be used as the sole basis for treatment or other patient management  decisions.  A negative result may occur with improper specimen collection / handling, submission of specimen other than nasopharyngeal swab, presence of viral mutation(s) within the areas targeted by this assay,  and inadequate number of viral copies (<250 copies / mL). A negative result must be combined with clinical observations, patient history, and epidemiological information.  Fact Sheet for Patients:   https://www.patel.info/  Fact Sheet for Healthcare Providers: https://hall.com/  This test is not yet approved or  cleared by the [place] FDA and has been authorized for detection and/or diagnosis of SARS-CoV-2 by FDA under an Emergency Use Authorization (EUA).  This EUA will remain in effect (meaning this test can be used) for the duration of the COVID-19 declaration under Section 564(b)(1) of the Act, 21 U.S.C. section 360bbb-3(b)(1), unless the authorization is terminated or revoked sooner.  Performed at [facility], [address] [place], [place] [address]   Culture, blood (Routine x 2)     Status: Abnormal   Collection Time: 10/28/21  7:20 PM   Specimen: Left Antecubital; Blood  Result Value Ref Range Status   Specimen Description   Final    LEFT ANTECUBITAL BLOOD Performed at [facility], [place] [address], [place], [place] [address]    Special Requests   Final    BOTTLES DRAWN AEROBIC AND ANAEROBIC Blood Culture adequate volume Performed at [facility], [address] [place], [place] [address]    Culture  Setup Time   Final    GRAM POSITIVE COCCI IN BOTH AEROBIC AND ANAEROBIC BOTTLES CRITICAL RESULT CALLED TO, READ BACK BY AND VERIFIED WITH: PHARMD A. PAYTES [id number] '@1039'$  FH    Culture (A)  Final    GROUP B STREP(S.AGALACTIAE)ISOLATED SUSCEPTIBILITIES PERFORMED ON PREVIOUS CULTURE WITHIN THE LAST 5 DAYS. Performed at [facility], [place] [address], [place], [place] [address]     Report Status 10/31/2021 FINAL  Final  Culture, blood (Routine x 2)     Status: Abnormal   Collection Time: 10/28/21  7:25 PM   Specimen: BLOOD LEFT ARM  Result Value Ref Range Status   Specimen Description   Final    BLOOD LEFT ARM Performed at [facility], [address] [place], [place] [address]    Special Requests   Final    BOTTLES DRAWN AEROBIC ONLY Blood Culture results may not be optimal due to an inadequate volume of blood received in culture bottles Performed at [facility], [address] [place], [place] [address]    Culture  Setup Time   Final    GRAM POSITIVE COCCI IN CHAINS BOTTLES DRAWN AEROBIC ONLY CRITICAL RESULT CALLED TO, READ BACK BY AND VERIFIED WITH: PHARMD A. PAYTES [id number] '@1039'$  FH Performed at [place] [address] [place], [place] [address]    Culture GROUP B STREP(S.AGALACTIAE)ISOLATED (A)  Final   Report Status 10/31/2021 FINAL  Final   Organism ID, Bacteria GROUP B STREP(S.AGALACTIAE)ISOLATED  Final      Susceptibility   Group b strep(s.agalactiae)isolated - MIC*    CLINDAMYCIN >=1 RESISTANT Resistant     AMPICILLIN <=0.25 SENSITIVE Sensitive     ERYTHROMYCIN >=8 RESISTANT Resistant     VANCOMYCIN 0.5 SENSITIVE Sensitive     CEFTRIAXONE <=0.12 SENSITIVE Sensitive     LEVOFLOXACIN 0.5 SENSITIVE Sensitive     PENICILLIN Value in next row Sensitive      SENSITIVE0.06    * GROUP B STREP(S.AGALACTIAE)ISOLATED  Blood Culture ID Panel (Reflexed)     Status: Abnormal   Collection Time: 10/28/21  7:25 PM  Result Value Ref Range Status   Enterococcus faecalis  NOT DETECTED NOT DETECTED Final   Enterococcus Faecium NOT DETECTED NOT DETECTED Final   Listeria monocytogenes NOT DETECTED NOT DETECTED Final   Staphylococcus species NOT DETECTED NOT DETECTED Final   Staphylococcus aureus (BCID) NOT DETECTED NOT DETECTED Final   Staphylococcus epidermidis NOT DETECTED NOT DETECTED Final   Staphylococcus lugdunensis NOT  DETECTED NOT DETECTED Final   Streptococcus species DETECTED (A) NOT DETECTED Final    Comment: CRITICAL RESULT CALLED TO, READ BACK BY AND VERIFIED WITH: PHARMD A. PAYTES [id number] '@1039'$  FH    Streptococcus agalactiae DETECTED (A) NOT DETECTED Final    Comment: CRITICAL RESULT CALLED TO, READ BACK BY AND VERIFIED WITH: PHARMD A. PAYTES [id number] '@1039'$  FH    Streptococcus pneumoniae NOT DETECTED NOT DETECTED Final   Streptococcus pyogenes NOT DETECTED NOT DETECTED Final   A.calcoaceticus-baumannii NOT DETECTED NOT DETECTED Final   Bacteroides fragilis NOT DETECTED NOT DETECTED Final   Enterobacterales NOT DETECTED NOT DETECTED Final   Enterobacter cloacae complex NOT DETECTED NOT DETECTED Final   Escherichia coli NOT DETECTED NOT DETECTED Final   Klebsiella aerogenes NOT DETECTED NOT DETECTED Final   Klebsiella oxytoca NOT DETECTED NOT DETECTED Final   Klebsiella pneumoniae NOT DETECTED NOT DETECTED Final   Proteus species NOT DETECTED NOT DETECTED Final   Salmonella species NOT DETECTED NOT DETECTED Final   Serratia marcescens NOT DETECTED NOT DETECTED Final   Haemophilus influenzae NOT DETECTED NOT DETECTED Final   Neisseria meningitidis NOT DETECTED NOT DETECTED Final   Pseudomonas aeruginosa NOT DETECTED NOT DETECTED Final   Stenotrophomonas maltophilia NOT DETECTED NOT DETECTED Final   Candida albicans NOT DETECTED NOT DETECTED Final   Candida auris NOT DETECTED NOT DETECTED Final   Candida glabrata NOT DETECTED NOT DETECTED Final   Candida krusei NOT DETECTED NOT DETECTED Final   Candida parapsilosis NOT DETECTED NOT DETECTED Final   Candida tropicalis NOT DETECTED NOT DETECTED Final   Cryptococcus neoformans/gattii NOT DETECTED NOT DETECTED Final    Comment: Performed at [facility], [place] [address] [place], [place] [address]  MRSA Next Gen by PCR, Nasal     Status: None   Collection Time: 10/29/21  1:06 AM   Specimen: Nasal Mucosa; Nasal Swab  Result Value Ref Range  Status   MRSA by PCR Next Gen NOT DETECTED NOT DETECTED Final    Comment: (NOTE) The GeneXpert MRSA Assay (FDA approved for NASAL specimens only), is one component of a comprehensive MRSA colonization surveillance program. It is not intended to diagnose MRSA infection nor to guide or monitor treatment for MRSA infections. Test performance is not FDA approved in patients less than 49 years old. Performed at [facility], [place] [address], [place], [place] [address]   Culture, blood (Routine X 2) w Reflex to ID Panel     Status: None (Preliminary result)   Collection Time: 10/31/21 11:44 AM   Specimen: BLOOD  Result Value Ref Range Status   Specimen Description BLOOD BLOOD RIGHT HAND  Final   Special Requests   Final    BOTTLES DRAWN AEROBIC AND ANAEROBIC Blood Culture results may not be optimal due to an inadequate volume of blood received in culture bottles   Culture   Final    NO GROWTH 3 DAYS Performed at [facility], [place] [address] [place], [place] [address]    Report Status PENDING  Incomplete  Culture, blood (Routine X 2) w Reflex to ID Panel     Status: None (Preliminary  result)   Collection Time: 10/31/21 11:56 AM   Specimen: BLOOD  Result Value Ref Range Status   Specimen Description BLOOD BLOOD LEFT HAND  Final   Special Requests   Final    BOTTLES DRAWN AEROBIC ONLY Blood Culture adequate volume   Culture   Final    NO GROWTH 3 DAYS Performed at [facility], [address] [place], [place] [address]    Report Status PENDING  Incomplete  Aerobic/Anaerobic Culture w Gram Stain (surgical/deep wound)     Status: None (Preliminary result)   Collection Time: 10/31/21  5:15 PM   Specimen: Abscess  Result Value Ref Range Status   Specimen Description ABSCESS LEFT KNEE  Final   Special Requests NONE  Final   Gram Stain   Final    NO ORGANISMS SEEN MODERATE WBC PRESENT, PREDOMINANTLY PMN    Culture   Final    NO GROWTH 3 DAYS NO ANAEROBES ISOLATED;  CULTURE IN PROGRESS FOR 5 DAYS Performed at [facility], [place] [address], [place], [place] [address]    Report Status PENDING  Incomplete         Radiology Studies: VAS [place] UPPER EXTREMITY VENOUS DUPLEX  Result Date: 11/02/2021 UPPER VENOUS STUDY  Patient Name:  Kristine [name]  Date of Exam:   11/02/2021 Medical Rec #: [id number]             Accession #:    [id number] Date of Birth: January 28, 1946            Patient Gender: F Patient Age:   2 years Exam Location:  [facility] Procedure:      VAS [place] UPPER EXTREMITY VENOUS DUPLEX Referring Phys: HEATHER PEMBERTON --------------------------------------------------------------------------------  Indications: Edema Comparison Study: No prior study Performing Technologist: Maudry Mayhew MHA, RDMS, RVT, RDCS  Examination Guidelines: A complete evaluation includes B-mode imaging, spectral Doppler, color Doppler, and power Doppler as needed of all accessible portions of each vessel. Bilateral testing is considered an integral part of a complete examination. Limited examinations for reoccurring indications may be performed as noted.  Right Findings: +----------+------------+---------+-----------+----------+-------+ RIGHT     CompressiblePhasicitySpontaneousPropertiesSummary +----------+------------+---------+-----------+----------+-------+ IJV           Full                                          +----------+------------+---------+-----------+----------+-------+ Subclavian    Full                                          +----------+------------+---------+-----------+----------+-------+ Axillary      None                 No                Acute  +----------+------------+---------+-----------+----------+-------+ Brachial      None                 No                Acute  +----------+------------+---------+-----------+----------+-------+ Radial        Full                                           +----------+------------+---------+-----------+----------+-------+  Ulnar         Full                                          +----------+------------+---------+-----------+----------+-------+ Cephalic      Full                                          +----------+------------+---------+-----------+----------+-------+ Basilic       Full                                          +----------+------------+---------+-----------+----------+-------+  Left Findings: +----------+------------+---------+-----------+----------+-------+ LEFT      CompressiblePhasicitySpontaneousPropertiesSummary +----------+------------+---------+-----------+----------+-------+ Subclavian               Yes       Yes                      +----------+------------+---------+-----------+----------+-------+  Summary:  Right: No evidence of superficial vein thrombosis in the upper extremity. Findings consistent with acute deep vein thrombosis involving the right axillary vein and right brachial veins.  Left: No evidence of thrombosis in the subclavian.  *See table(s) above for measurements and observations.    Preliminary         Scheduled Meds:  allopurinol  300 mg Oral q AM   DULoxetine  30 mg Oral Daily   insulin aspart  0-20 Units Subcutaneous TID WC   insulin aspart  0-5 Units Subcutaneous QHS   insulin glargine-yfgn  25 Units Subcutaneous Daily   levothyroxine  88 mcg Oral q morning   metoprolol tartrate  12.5 mg Oral BID   pregabalin  50 mg Oral BID   Vitamin D (Ergocalciferol)  50,000 Units Oral Q7 days   Continuous Infusions:   ceFAZolin (ANCEF) IV 2 g (11/03/21 1019)   furosemide 100 mg (11/03/21 1146)   heparin 1,500 Units/hr (11/03/21 0755)     LOS: 5 days       Phillips Climes, MD [facility]   To contact the attending provider between 7A-7P or the covering provider during after hours 7P-7A, please log into the web site [email or web address] and access using universal  [place] password for that web site. If you do not have the password, please call the hospital operator.  11/03/2021, 12:34 PM

## 2021-11-04 DIAGNOSIS — J9601 Acute respiratory failure with hypoxia: Secondary | ICD-10-CM | POA: Diagnosis not present

## 2021-11-04 DIAGNOSIS — L0390 Cellulitis, unspecified: Secondary | ICD-10-CM

## 2021-11-04 DIAGNOSIS — I4891 Unspecified atrial fibrillation: Secondary | ICD-10-CM | POA: Diagnosis not present

## 2021-11-04 DIAGNOSIS — R6521 Severe sepsis with septic shock: Secondary | ICD-10-CM | POA: Diagnosis not present

## 2021-11-04 DIAGNOSIS — R7881 Bacteremia: Secondary | ICD-10-CM

## 2021-11-04 DIAGNOSIS — A419 Sepsis, unspecified organism: Secondary | ICD-10-CM | POA: Diagnosis not present

## 2021-11-04 LAB — CBC
HCT: 31.5 % — ABNORMAL LOW (ref 36.0–46.0)
Hemoglobin: 10.2 g/dL — ABNORMAL LOW (ref 12.0–15.0)
MCH: 28.3 pg (ref 26.0–34.0)
MCHC: 32.4 g/dL (ref 30.0–36.0)
MCV: 87.5 fL (ref 80.0–100.0)
Platelets: 119 10*3/uL — ABNORMAL LOW (ref 150–400)
RBC: 3.6 MIL/uL — ABNORMAL LOW (ref 3.87–5.11)
RDW: 16.5 % — ABNORMAL HIGH (ref 11.5–15.5)
WBC: 10.4 10*3/uL (ref 4.0–10.5)
nRBC: 0 % (ref 0.0–0.2)

## 2021-11-04 LAB — BASIC METABOLIC PANEL
Anion gap: 11 (ref 5–15)
BUN: 93 mg/dL — ABNORMAL HIGH (ref 8–23)
CO2: 30 mmol/L (ref 22–32)
Calcium: 9.7 mg/dL (ref 8.9–10.3)
Chloride: 92 mmol/L — ABNORMAL LOW (ref 98–111)
Creatinine, Ser: 2.4 mg/dL — ABNORMAL HIGH (ref 0.44–1.00)
GFR, Estimated: 21 mL/min — ABNORMAL LOW (ref >60)
Glucose, Bld: 188 mg/dL — ABNORMAL HIGH (ref 70–99)
Potassium: 3.7 mmol/L (ref 3.5–5.1)
Sodium: 133 mmol/L — ABNORMAL LOW (ref 135–145)

## 2021-11-04 LAB — GLUCOSE, CAPILLARY
Glucose-Capillary: 192 mg/dL — ABNORMAL HIGH (ref 70–99)
Glucose-Capillary: 185 mg/dL — ABNORMAL HIGH (ref 70–99)
Glucose-Capillary: 214 mg/dL — ABNORMAL HIGH (ref 70–99)
Glucose-Capillary: 248 mg/dL — ABNORMAL HIGH (ref 70–99)

## 2021-11-04 LAB — HEPARIN LEVEL (UNFRACTIONATED): Heparin Unfractionated: 0.54 IU/mL (ref 0.30–0.70)

## 2021-11-04 MED ORDER — APIXABAN 2.5 MG PO TABS
2.5000 mg | ORAL_TABLET | Freq: Two times a day (BID) | ORAL | Status: DC
  Administered 2021-11-04 – 2021-11-08 (×8): 2.5 mg via ORAL
  Filled 2021-11-04 (×8): qty 1

## 2021-11-04 MED ORDER — POTASSIUM CHLORIDE CRYS ER 20 MEQ PO TBCR
40.0000 meq | EXTENDED_RELEASE_TABLET | Freq: Once | ORAL | Status: AC
  Administered 2021-11-04: 40 meq via ORAL
  Filled 2021-11-04: qty 2

## 2021-11-04 NOTE — Progress Notes (Signed)
ANTICOAGULATION CONSULT NOTE - Follow Up Consult  Pharmacy Consult for Heparin Indication: atrial fibrillation  Allergies  Allergen Reactions   Albuterol Other (See Comments)    REACTION: Tachycardia- AFib   Epinephrine Other (See Comments)    Increases heart rate per patient.    Penicillins Other (See Comments)    REACTION: flushing' \\T'$ \ hot  Did it involve swelling of the face/tongue/throat, SOB, or low BP? No Did it involve sudden or severe rash/hives, skin peeling, or any reaction on the inside of your mouth or nose? No Did you need to seek medical attention at a hospital or doctor's office? No When did it last happen?    20 years ago   If all above answers are "NO", may proceed with cephalosporin use.   Ceftriaxone Other (See Comments)    Sweats   Citalopram Hydrobromide Other (See Comments)    Prolonged QT prolongation   Exenatide Other (See Comments)    Unknown reaction   Ketorolac Tromethamine Hives and Swelling    swelling in hands   Tizanidine Other (See Comments)    lethargic   Torsemide Swelling   Zoledronic Acid Other (See Comments)    Pt was hospitalized due to medication   Bee Venom Other (See Comments)    "makes me nervous"   Ivp Dye [Iodinated Contrast Media] Other (See Comments)    flushing   Keflex [Cephalexin] Other (See Comments)    Makes her feel warm; she was told never to take it.    Patient Measurements: Height: '5\' 9"'$  (175.3 cm) Weight: 135 kg (297 lb 9.9 oz) IBW/kg (Calculated) : 66.2 kg Heparin Dosing Weight: 99 kg  Vital Signs: Temp: 97.7 F (36.5 C) (08/15 1213) Temp Source: Oral (08/15 1213) BP: 102/52 (08/15 1213) Pulse Rate: 85 (08/15 1545)  Labs: Recent Labs    11/02/21 0326 11/03/21 0938 11/04/21 0455  HGB 9.9* 10.0* 10.2*  HCT 31.5* 31.1* 31.5*  PLT 123* 118* 119*  HEPARINUNFRC 0.37 0.42 0.54  CREATININE 2.61* 2.43* 2.40*     Estimated Creatinine Clearance: 30.4 mL/min (A) (by C-G formula based on SCr of 2.4 mg/dL  (H)).  Assessment: 76 yo F presents with AMS/sepsis. Pt on Xarelto '15mg'$  in the past but changed to apixaban 2.'5mg'$  po BID back in July - stopped ~1 week PTA due to severe hemoptysis and knee hematoma/bleeding. Pharmacy consulted to start heparin for afib on 8/9 pm per cards recs.   Pharmacy consulted to transition back to apixaban. Pt meets criteria for '5mg'$  BID dose but given recurrent bleeding issues will resume home dose (2.'5mg'$  BID) for now.  Goal of Therapy:  Heparin level 0.3-0.7 units/ml Monitor platelets by anticoagulation protocol: Yes   Plan:  Stop heparin Resume apixaban 2.'5mg'$  BID  Arrie Senate, PharmD, BCPS, [facility] Clinical Pharmacist [phone] Please check AMION for all [facility] Pharmacy numbers 11/04/2021

## 2021-11-04 NOTE — Progress Notes (Signed)
PROGRESS NOTE    Kristine [name]  KPT:[id number] DOB: 01/10/1946 DOA: 10/28/2021 PCP: Prince Solian, MD    Chief Complaint  Patient presents with   Altered Mental Status    Brief Narrative:   76 year old woman who presented to [facility] 8/8 with fatigue and AMS. PMHx significant for HTN, HLD, AF (previously on Eliquis), mild AS, CHF with cardiac amyloidosis (TTR, diagnosed 10/2021), COPD with chronic bronchitis, OSA, T2DM, hypothyroidism, CKD stage IV, GERD, depression/anxiety. -Patient presents with extreme weakness, fatigue, work-up significant for sepsis, culture is growing Streptococcus, please see discussion below.    Assessment & Plan:   Principal Problem:   Septic shock ([place]) Active Problems:   Diabetes mellitus, type 2 (HCC)   CKD (chronic kidney disease) stage 4, GFR 15-29 ml/min (HCC)   Essential hypertension   Pulmonary hypertension (HCC)   Persistent atrial fibrillation (HCC)   Paroxysmal atrial fibrillation (HCC)   Pressure injury of skin   Bacteremia   Cellulitis  Septic shock secondary to be strep bacteremia -Sepsis present on admission, hypotensive requiring pressors, leukocytosis, elevated lactic acid -Blood culture growing group be Streptococcus, surveillance blood culture remains negative to date -TEE 8/14 with no evidence of endocarditis -Orthopedic/plastic surgery consult greatly appreciated, no significant concern of left knee infection at this point -Fluid collection in left knee status post aspiration by IR, continue to follow on cultures, so far no growth to date -ID input greatly appreciated, she was kept on IV cefazolin, treatment for total of 3 weeks recommendation, she will has 2 more weeks or if, okay to transition to oral antibiotics,Cefadroxil 500 mg po bid for 2 more weeks until 8/29  Atrial fibrillation History of persistent AF. Previously on Eliquis. Hx of amio toxicity in 2017 Management per cardiology, she did require Cardizem drip  initially, -Heparin GTT f>> transition to Eliquis today per cards recommendation  Acute on chronic right-sided heart failure Chronic diastolic CHF Acute on chronic right-sided heart failure Cardiac amyloidosis, TTR Pulm HTN Mild AS HTN HLD Echo 08/2021 with EF 55-60%, no RWMAs, mild LVH, D-shaped septum in diastole c/w severely elevated PASP, reduced RV function. -Significantly volume overload, diuresis per cardiology, remain positive balance since admission +1.2 L, she is currently on Lasix 100 mg IV 3 times daily, she is -1.6 L over last 24 hours, but total positive since admission.   -No ACE/ARB/ARNI, due to poor renal function   Hypokalemia -monitor and  replete especially she is on IV diuresis  Acute metabolic encephalopathy -Due to infection, CT head with no acute findings, ammonia is 38, urine analysis within normal limit -Significantly improved -Now mentation has improved we will resume Cymbalta and Lyrica  Longstanding ulcerations of BLE (feet) L knee wound, nonhealing -underwent drainage of left knee hematoma and placement of drain 3/20203 by Dr. Rae Roam evacuation by Dr. Marla Roe -MRI with no evidence of infection, and bilateral feet or left knee,   AKI on CKD stage IV P: -repleted K; repeat bmp pending -Trend BMP / urinary output -Replace electrolytes as indicated -Avoid nephrotoxic agents, ensure adequate renal perfusion   Hypoglycemia T2DM Peripheral neuropathy -She is with significant insulin requirement at home, CBG started to increase will resume at a low dose Semglee and insulin sliding scale -on Cymbalta and Lyrica   Acute hypoxemic respiratory failure/insufficiency COPD with chronic bronchitis OSA: no cpap at home P: -continue to wean O2 for sats >92% -cpap qhs when less altered and safe to use -prn xopenex for wheezing   Atrial fibrillation History of  persistent AF. Previously on Eliquis. Hx of amio toxicity in 2017 Management per  cardiology, remains on heparin GTT, hopefully can be transitioned to Eliquis once no procedures are anticipated . -Initially requiring Cardizem drip, currently on po metoprolol.  CHF Chronic diastolic CHF Acute on chronic right-sided heart failure Cardiac amyloidosis, TTR Pulm HTN Mild AS HTN HLD Echo 08/2021 with EF 55-60%, no RWMAs, mild LVH, D-shaped septum in diastole c/w severely elevated PASP, reduced RV function. Management per cardiology, on IV Lasix, no ACE/ARB due to poor renal function   GERD - PPI when able to take PO   Gout Resume allopurinol   Hypothyroidism - Resume home synthroid when able to take po   Depression Anxiety P: - hold xanax  Morbid obesity Body mass index is 43.95 kg/m.   Acute right upper extremity DVT -As of DVT on venous Dopplers, she is on anticoagulation.     DVT prophylaxis: Heparin GTT Code Status: Partial Family Communication: D/W husband at bedside on daily basis Disposition:   Status is: Inpatient    Consultants:  PCCM ID plastic surgery Orthopedic     Subjective:  No significant events overnight, she denies any complaints today  Objective: Vitals:   11/04/21 1100 11/04/21 1200 11/04/21 1213 11/04/21 1300  BP:   (!) 102/52   Pulse: 92 94 82 98  Resp:   18 18  Temp:   97.7 F (36.5 C)   TempSrc:   Oral   SpO2: 100% 100% 100% 92%  Weight:      Height:        Intake/Output Summary (Last 24 hours) at 11/04/2021 1538 Last data filed at 11/04/2021 1300 Gross per 24 hour  Intake 2065.62 ml  Output 2200 ml  Net -134.38 ml   Filed Weights   11/01/21 1500 11/02/21 0445 11/03/21 0417  Weight: 134.8 kg 135.7 kg 135 kg    Examination:   Awake Alert, Oriented X 3, No new F.N deficits, Normal affect Symmetrical Chest wall movement, Good air movement bilaterally, CTAB RRR,No Gallops,Rubs or new Murmurs, No Parasternal Heave +ve B.Sounds, Abd Soft, No tenderness, No rebound - guarding or rigidity. No  Cyanosis, Clubbing, she has right upper extremity edema, and bilateral lower extremity edema.   Data Reviewed: I have personally reviewed following labs and imaging studies  CBC: Recent Labs  Lab 10/28/21 1920 10/28/21 1923 10/29/21 0201 10/29/21 0439 10/31/21 0325 11/01/21 0313 11/02/21 0326 11/03/21 0938 11/04/21 0455  WBC 26.0*  --  40.6*   < > 15.8* 11.8* 9.8 10.6* 10.4  NEUTROABS 24.0*  --  37.3*  --   --   --   --   --   --   HGB 11.8*   < > 9.3*   < > 10.7* 10.1* 9.9* 10.0* 10.2*  HCT 36.6   < > 29.0*   < > 32.7* 30.9* 31.5* 31.1* 31.5*  MCV 88.8  --  89.5   < > 87.4 88.3 89.7 89.1 87.5  PLT 192  --  138*   < > 142* 218 123* 118* 119*   < > = values in this interval not displayed.    Basic Metabolic Panel: Recent Labs  Lab 10/28/21 1944 10/29/21 0439 10/29/21 1009 10/31/21 0325 11/01/21 0313 11/02/21 0326 11/03/21 0938 11/04/21 0455  NA  --  134*   < > 130* 134* 135 133* 133*  K  --  3.0*   < > 3.4* 3.4* 3.6 3.9 3.7  CL  --  93*   < >  93* 95* 94* 93* 92*  CO2  --  27   < > '23 25 30 29 30  '$ GLUCOSE  --  82   < > 240* 115* 172* 173* 188*  BUN  --  85*   < > 101* 102* 99* 95* 93*  CREATININE  --  2.87*   < > 2.82* 2.66* 2.61* 2.43* 2.40*  CALCIUM  --  8.8*   < > 9.1 9.4 9.6 9.5 9.7  MG 2.4 2.3  --   --   --   --   --   --   PHOS  --  4.8*  --   --   --   --   --   --    < > = values in this interval not displayed.    GFR: Estimated Creatinine Clearance: 30.4 mL/min (A) (by C-G formula based on SCr of 2.4 mg/dL (H)).  Liver Function Tests: Recent Labs  Lab 10/28/21 1920 10/29/21 0439  AST 21 16  ALT 9 9  ALKPHOS 110 76  BILITOT 2.2* 2.1*  PROT 7.8 5.7*  ALBUMIN 4.0 2.9*    CBG: Recent Labs  Lab 11/03/21 1148 11/03/21 1630 11/03/21 2113 11/04/21 0733 11/04/21 1212  GLUCAP 204* 211* 174* 192* 185*     Recent Results (from the past 240 hour(s))  SARS Coronavirus 2 by RT PCR (hospital order, performed in [facility] hospital lab) *cepheid  single result test* Anterior Nasal Swab     Status: None   Collection Time: 10/28/21  6:50 PM   Specimen: Anterior Nasal Swab  Result Value Ref Range Status   SARS Coronavirus 2 by RT PCR NEGATIVE NEGATIVE Final    Comment: (NOTE) SARS-CoV-2 target nucleic acids are NOT DETECTED.  The SARS-CoV-2 RNA is generally detectable in upper and lower respiratory specimens during the acute phase of infection. The lowest concentration of SARS-CoV-2 viral copies this assay can detect is 250 copies / mL. A negative result does not preclude SARS-CoV-2 infection and should not be used as the sole basis for treatment or other patient management decisions.  A negative result may occur with improper specimen collection / handling, submission of specimen other than nasopharyngeal swab, presence of viral mutation(s) within the areas targeted by this assay, and inadequate number of viral copies (<250 copies / mL). A negative result must be combined with clinical observations, patient history, and epidemiological information.  Fact Sheet for Patients:   https://www.patel.info/  Fact Sheet for Healthcare Providers: https://hall.com/  This test is not yet approved or  cleared by the [place] FDA and has been authorized for detection and/or diagnosis of SARS-CoV-2 by FDA under an Emergency Use Authorization (EUA).  This EUA will remain in effect (meaning this test can be used) for the duration of the COVID-19 declaration under Section 564(b)(1) of the Act, 21 U.S.C. section 360bbb-3(b)(1), unless the authorization is terminated or revoked sooner.  Performed at [facility], [address] [place], [place] [address]   Culture, blood (Routine x 2)     Status: Abnormal   Collection Time: 10/28/21  7:20 PM   Specimen: Left Antecubital; Blood  Result Value Ref Range Status   Specimen Description   Final    LEFT ANTECUBITAL BLOOD Performed at  [facility], [place] [address] [place], [place] [address]    Special Requests   Final    BOTTLES DRAWN AEROBIC AND ANAEROBIC Blood Culture adequate volume Performed at [facility]  [address] [place] [place], [place] [address]    Culture  Setup Time   Final    GRAM POSITIVE COCCI IN BOTH AEROBIC AND ANAEROBIC BOTTLES CRITICAL RESULT CALLED TO, READ BACK BY AND VERIFIED WITH: PHARMD A. PAYTES [id number] '@1039'$  FH    Culture (A)  Final    GROUP B STREP(S.AGALACTIAE)ISOLATED SUSCEPTIBILITIES PERFORMED ON PREVIOUS CULTURE WITHIN THE LAST 5 DAYS. Performed at [facility], [place] [address] [place], [place] [address]    Report Status 10/31/2021 FINAL  Final  Culture, blood (Routine x 2)     Status: Abnormal   Collection Time: 10/28/21  7:25 PM   Specimen: BLOOD LEFT ARM  Result Value Ref Range Status   Specimen Description   Final    BLOOD LEFT ARM Performed at [facility], [address] [place], [place] [address]    Special Requests   Final    BOTTLES DRAWN AEROBIC ONLY Blood Culture results may not be optimal due to an inadequate volume of blood received in culture bottles Performed at [facility], [address] [place], [place] [address]    Culture  Setup Time   Final    GRAM POSITIVE COCCI IN CHAINS BOTTLES DRAWN AEROBIC ONLY CRITICAL RESULT CALLED TO, READ BACK BY AND VERIFIED WITH: PHARMD A. PAYTES [id number] '@1039'$  FH Performed at [place] [address] [place], [place] [address]    Culture GROUP B STREP(S.AGALACTIAE)ISOLATED (A)  Final   Report Status 10/31/2021 FINAL  Final   Organism ID, Bacteria GROUP B STREP(S.AGALACTIAE)ISOLATED  Final      Susceptibility   Group b strep(s.agalactiae)isolated - MIC*    CLINDAMYCIN >=1 RESISTANT Resistant     AMPICILLIN <=0.25 SENSITIVE Sensitive     ERYTHROMYCIN >=8 RESISTANT Resistant     VANCOMYCIN 0.5 SENSITIVE Sensitive     CEFTRIAXONE <=0.12 SENSITIVE Sensitive     LEVOFLOXACIN  0.5 SENSITIVE Sensitive     PENICILLIN Value in next row Sensitive      SENSITIVE0.06    * GROUP B STREP(S.AGALACTIAE)ISOLATED  Blood Culture ID Panel (Reflexed)     Status: Abnormal   Collection Time: 10/28/21  7:25 PM  Result Value Ref Range Status   Enterococcus faecalis NOT DETECTED NOT DETECTED Final   Enterococcus Faecium NOT DETECTED NOT DETECTED Final   Listeria monocytogenes NOT DETECTED NOT DETECTED Final   Staphylococcus species NOT DETECTED NOT DETECTED Final   Staphylococcus aureus (BCID) NOT DETECTED NOT DETECTED Final   Staphylococcus epidermidis NOT DETECTED NOT DETECTED Final   Staphylococcus lugdunensis NOT DETECTED NOT DETECTED Final   Streptococcus species DETECTED (A) NOT DETECTED Final    Comment: CRITICAL RESULT CALLED TO, READ BACK BY AND VERIFIED WITH: PHARMD A. PAYTES [id number] '@1039'$  FH    Streptococcus agalactiae DETECTED (A) NOT DETECTED Final    Comment: CRITICAL RESULT CALLED TO, READ BACK BY AND VERIFIED WITH: PHARMD A. PAYTES [id number] '@1039'$  FH    Streptococcus pneumoniae NOT DETECTED NOT DETECTED Final   Streptococcus pyogenes NOT DETECTED NOT DETECTED Final   A.calcoaceticus-baumannii NOT DETECTED NOT DETECTED Final   Bacteroides fragilis NOT DETECTED NOT DETECTED Final   Enterobacterales NOT DETECTED NOT DETECTED Final   Enterobacter cloacae complex NOT DETECTED NOT DETECTED Final   Escherichia coli NOT DETECTED NOT DETECTED Final   Klebsiella aerogenes NOT DETECTED NOT DETECTED Final   Klebsiella oxytoca NOT DETECTED NOT DETECTED Final   Klebsiella pneumoniae NOT DETECTED NOT DETECTED Final  Proteus species NOT DETECTED NOT DETECTED Final   Salmonella species NOT DETECTED NOT DETECTED Final   Serratia marcescens NOT DETECTED NOT DETECTED Final   Haemophilus influenzae NOT DETECTED NOT DETECTED Final   Neisseria meningitidis NOT DETECTED NOT DETECTED Final   Pseudomonas aeruginosa NOT DETECTED NOT DETECTED Final   Stenotrophomonas maltophilia NOT  DETECTED NOT DETECTED Final   Candida albicans NOT DETECTED NOT DETECTED Final   Candida auris NOT DETECTED NOT DETECTED Final   Candida glabrata NOT DETECTED NOT DETECTED Final   Candida krusei NOT DETECTED NOT DETECTED Final   Candida parapsilosis NOT DETECTED NOT DETECTED Final   Candida tropicalis NOT DETECTED NOT DETECTED Final   Cryptococcus neoformans/gattii NOT DETECTED NOT DETECTED Final    Comment: Performed at [facility], [address], [place], [place] [address]  MRSA Next Gen by PCR, Nasal     Status: None   Collection Time: 10/29/21  1:06 AM   Specimen: Nasal Mucosa; Nasal Swab  Result Value Ref Range Status   MRSA by PCR Next Gen NOT DETECTED NOT DETECTED Final    Comment: (NOTE) The GeneXpert MRSA Assay (FDA approved for NASAL specimens only), is one component of a comprehensive MRSA colonization surveillance program. It is not intended to diagnose MRSA infection nor to guide or monitor treatment for MRSA infections. Test performance is not FDA approved in patients less than 57 years old. Performed at [facility], [place] [address] [place], [place] [address]   Culture, blood (Routine X 2) w Reflex to ID Panel     Status: None (Preliminary result)   Collection Time: 10/31/21 11:44 AM   Specimen: BLOOD  Result Value Ref Range Status   Specimen Description BLOOD BLOOD RIGHT HAND  Final   Special Requests   Final    BOTTLES DRAWN AEROBIC AND ANAEROBIC Blood Culture results may not be optimal due to an inadequate volume of blood received in culture bottles   Culture   Final    NO GROWTH 4 DAYS Performed at [facility], [place] [address] [place], [place] [address]    Report Status PENDING  Incomplete  Culture, blood (Routine X 2) w Reflex to ID Panel     Status: None (Preliminary result)   Collection Time: 10/31/21 11:56 AM   Specimen: BLOOD  Result Value Ref Range Status   Specimen Description BLOOD BLOOD LEFT HAND  Final   Special Requests    Final    BOTTLES DRAWN AEROBIC ONLY Blood Culture adequate volume   Culture   Final    NO GROWTH 4 DAYS Performed at [facility], [place] [address] [place], [place] [address]    Report Status PENDING  Incomplete  Aerobic/Anaerobic Culture w Gram Stain (surgical/deep wound)     Status: None (Preliminary result)   Collection Time: 10/31/21  5:15 PM   Specimen: Abscess  Result Value Ref Range Status   Specimen Description ABSCESS LEFT KNEE  Final   Special Requests NONE  Final   Gram Stain   Final    NO ORGANISMS SEEN MODERATE WBC PRESENT, PREDOMINANTLY PMN    Culture   Final    NO GROWTH 4 DAYS NO ANAEROBES ISOLATED; CULTURE IN PROGRESS FOR 5 DAYS Performed at [facility], [place] [address], [place], [place] [address]    Report Status PENDING  Incomplete         Radiology Studies: No results found.      Scheduled  Meds:  allopurinol  300 mg Oral q AM   DULoxetine  30 mg Oral Daily   insulin aspart  0-20 Units Subcutaneous TID WC   insulin aspart  0-5 Units Subcutaneous QHS   insulin glargine-yfgn  25 Units Subcutaneous Daily   levothyroxine  88 mcg Oral q morning   metoprolol tartrate  12.5 mg Oral BID   pregabalin  50 mg Oral BID   Vitamin D (Ergocalciferol)  50,000 Units Oral Q7 days   Continuous Infusions:   ceFAZolin (ANCEF) IV Stopped (11/04/21 1113)   furosemide 100 mg (11/04/21 1418)   heparin 1,500 Units/hr (11/04/21 1300)     LOS: 6 days       Phillips Climes, MD [facility]   To contact the attending provider between 7A-7P or the covering provider during after hours 7P-7A, please log into the web site [email or web address] and access using universal [place] password for that web site. If you do not have the password, please call the hospital operator.  11/04/2021, 3:38 PM

## 2021-11-04 NOTE — Progress Notes (Signed)
ANTICOAGULATION CONSULT NOTE - Follow Up Consult  Pharmacy Consult for Heparin Indication: atrial fibrillation  Allergies  Allergen Reactions   Albuterol Other (See Comments)    REACTION: Tachycardia- AFib   Epinephrine Other (See Comments)    Increases heart rate per patient.    Penicillins Other (See Comments)    REACTION: flushing' \\T'$ \ hot  Did it involve swelling of the face/tongue/throat, SOB, or low BP? No Did it involve sudden or severe rash/hives, skin peeling, or any reaction on the inside of your mouth or nose? No Did you need to seek medical attention at a hospital or doctor's office? No When did it last happen?    20 years ago   If all above answers are "NO", may proceed with cephalosporin use.   Ceftriaxone Other (See Comments)    Sweats   Citalopram Hydrobromide Other (See Comments)    Prolonged QT prolongation   Exenatide Other (See Comments)    Unknown reaction   Ketorolac Tromethamine Hives and Swelling    swelling in hands   Tizanidine Other (See Comments)    lethargic   Torsemide Swelling   Zoledronic Acid Other (See Comments)    Pt was hospitalized due to medication   Bee Venom Other (See Comments)    "makes me nervous"   Ivp Dye [Iodinated Contrast Media] Other (See Comments)    flushing   Keflex [Cephalexin] Other (See Comments)    Makes her feel warm; she was told never to take it.    Patient Measurements: Height: '5\' 9"'$  (175.3 cm) Weight: 135 kg (297 lb 9.9 oz) IBW/kg (Calculated) : 66.2 kg Heparin Dosing Weight: 99 kg  Vital Signs: Temp: 97.4 F (36.3 C) (08/15 0730) Temp Source: Oral (08/15 0730) BP: 102/57 (08/15 0725) Pulse Rate: 93 (08/15 1000)  Labs: Recent Labs    11/02/21 0326 11/03/21 0938 11/04/21 0455  HGB 9.9* 10.0* 10.2*  HCT 31.5* 31.1* 31.5*  PLT 123* 118* 119*  HEPARINUNFRC 0.37 0.42 0.54  CREATININE 2.61* 2.43* 2.40*     Estimated Creatinine Clearance: 30.4 mL/min (A) (by C-G formula based on SCr of 2.4 mg/dL  (H)).  Assessment: 76 yo F presents with AMS/sepsis. Pt on Xarelto '15mg'$  in the past but changed to apixaban 2.'5mg'$  po BID back in July - stopped ~1 week PTA due to severe hemoptysis and knee hematoma/bleeding. Pharmacy consulted to start heparin for afib on 8/9 pm per cards recs. CHADS-VASc 5, may re-challenge with DOAC if Hgb remains stable. Apixaban should not still be affecting heparin levels.   Heparin level today is therapeutic at 0.54 on heparin IV 1500 units/hr units/hr.  Hgb 10.2 stable, plt decreased but stable at 119k.  No active bleeding reported.  Goal of Therapy:  Heparin level 0.3-0.7 units/ml Monitor platelets by anticoagulation protocol: Yes   Plan:  Continue heparin drip at 1500 units/hr Daily heparin level and CBC. Monitor for signs/symptoms of bleeding. Follow up anticoagulation plans per cardiology.   Nicole Cella, RPh Clinical Pharmacist 11/04/2021 11:42 AM

## 2021-11-04 NOTE — Progress Notes (Addendum)
Progress Note  Patient Name: Kristine [name] Date of Encounter: 11/04/2021  [facility] Cardiologist: Glori Bickers, MD   Subjective   Patient denies chest pain, sob.   Inpatient Medications    Scheduled Meds:  allopurinol  300 mg Oral q AM   DULoxetine  30 mg Oral Daily   insulin aspart  0-20 Units Subcutaneous TID WC   insulin aspart  0-5 Units Subcutaneous QHS   insulin glargine-yfgn  25 Units Subcutaneous Daily   levothyroxine  88 mcg Oral q morning   metoprolol tartrate  12.5 mg Oral BID   pregabalin  50 mg Oral BID   Vitamin D (Ergocalciferol)  50,000 Units Oral Q7 days   Continuous Infusions:   ceFAZolin (ANCEF) IV Stopped (11/04/21 4315)   furosemide 100 mg (11/04/21 0531)   heparin 1,500 Units/hr (11/04/21 0705)   PRN Meds: acetaminophen, ALPRAZolam, diphenhydrAMINE, docusate sodium, levalbuterol, mineral oil-hydrophilic petrolatum, mouth rinse, polyethylene glycol, traMADol   Vital Signs    Vitals:   11/04/21 0400 11/04/21 0700 11/04/21 0725 11/04/21 0730  BP:   (!) 102/57   Pulse:  84 91 94  Resp:  '13 16 14  '$ Temp: (!) 97.4 F (36.3 C)   (!) 97.4 F (36.3 C)  TempSrc: Oral   Oral  SpO2:  93% (!) 88% 97%  Weight:      Height:        Intake/Output Summary (Last 24 hours) at 11/04/2021 1002 Last data filed at 11/04/2021 0749 Gross per 24 hour  Intake --  Output 1600 ml  Net -1600 ml      11/03/2021    4:17 AM 11/02/2021    4:45 AM 11/01/2021    3:00 PM  Last 3 Weights  Weight (lbs) 297 lb 9.9 oz 299 lb 2.6 oz 297 lb 1.6 oz  Weight (kg) 135 kg 135.7 kg 134.764 kg      Telemetry    Atrial fibrillation, HR in the 80s-90s - Personally Reviewed  ECG    No new tracings since 8/10 - Personally Reviewed  Physical Exam   GEN: No acute distress.  Laying flat in the bed, sleeping when I entered room but was easily aroused  Neck: No JVD Cardiac: Irregular rate and rhythm, no murmurs  Respiratory: Expiratory wheezing GI: Soft,  nontender, non-distended  MS: 2+ edema in BLE  Neuro:  Nonfocal  Psych: Normal affect   Labs    High Sensitivity Troponin:   Recent Labs  Lab 10/28/21 1945 10/28/21 2139  TROPONINIHS 53* 84*     Chemistry Recent Labs  Lab 10/28/21 1920 10/28/21 1923 10/28/21 1944 10/29/21 0439 10/29/21 1009 11/02/21 0326 11/03/21 0938 11/04/21 0455  NA 134*   < >  --  134*   < > 135 133* 133*  K 2.7*   < >  --  3.0*   < > 3.6 3.9 3.7  CL 91*  --   --  93*   < > 94* 93* 92*  CO2 28  --   --  27   < > '30 29 30  '$ GLUCOSE 117*  --   --  82   < > 172* 173* 188*  BUN 89*  --   --  85*   < > 99* 95* 93*  CREATININE 2.86*  --   --  2.87*   < > 2.61* 2.43* 2.40*  CALCIUM 9.6  --   --  8.8*   < > 9.6 9.5 9.7  MG  --   --  2.4 2.3  --   --   --   --   PROT 7.8  --   --  5.7*  --   --   --   --   ALBUMIN 4.0  --   --  2.9*  --   --   --   --   AST 21  --   --  16  --   --   --   --   ALT 9  --   --  9  --   --   --   --   ALKPHOS 110  --   --  76  --   --   --   --   BILITOT 2.2*  --   --  2.1*  --   --   --   --   GFRNONAA 17*  --   --  17*   < > 19* 20* 21*  ANIONGAP 15  --   --  14   < > '11 11 11   '$ < > = values in this interval not displayed.    Lipids No results for input(s): "CHOL", "TRIG", "HDL", "LABVLDL", "LDLCALC", "CHOLHDL" in the last 168 hours.  Hematology Recent Labs  Lab 11/02/21 0326 11/03/21 0938 11/04/21 0455  WBC 9.8 10.6* 10.4  RBC 3.51* 3.49* 3.60*  HGB 9.9* 10.0* 10.2*  HCT 31.5* 31.1* 31.5*  MCV 89.7 89.1 87.5  MCH 28.2 28.7 28.3  MCHC 31.4 32.2 32.4  RDW 16.6* 16.7* 16.5*  PLT 123* 118* 119*   Thyroid  Recent Labs  Lab 10/29/21 1009  TSH 2.149    BNP Recent Labs  Lab 10/28/21 1945  BNP 276.7*    DDimer No results for input(s): "DDIMER" in the last 168 hours.   Radiology    VAS [place] UPPER EXTREMITY VENOUS DUPLEX  Result Date: 11/03/2021 UPPER VENOUS STUDY  Patient Name:  EMELY FAHY  Date of Exam:   11/02/2021 Medical Rec #: [id number]              Accession #:    [id number] Date of Birth: Jan 15, 1946            Patient Gender: F Patient Age:   50 years Exam Location:  [facility] Procedure:      VAS [place] UPPER EXTREMITY VENOUS DUPLEX Referring Phys: HEATHER PEMBERTON --------------------------------------------------------------------------------  Indications: Edema Comparison Study: No prior study Performing Technologist: Maudry Mayhew MHA, RDMS, RVT, RDCS  Examination Guidelines: A complete evaluation includes B-mode imaging, spectral Doppler, color Doppler, and power Doppler as needed of all accessible portions of each vessel. Bilateral testing is considered an integral part of a complete examination. Limited examinations for reoccurring indications may be performed as noted.  Right Findings: +----------+------------+---------+-----------+----------+-------+ RIGHT     CompressiblePhasicitySpontaneousPropertiesSummary +----------+------------+---------+-----------+----------+-------+ IJV           Full                                          +----------+------------+---------+-----------+----------+-------+ Subclavian    Full                                          +----------+------------+---------+-----------+----------+-------+ Axillary      None  No                Acute  +----------+------------+---------+-----------+----------+-------+ Brachial      None                 No                Acute  +----------+------------+---------+-----------+----------+-------+ Radial        Full                                          +----------+------------+---------+-----------+----------+-------+ Ulnar         Full                                          +----------+------------+---------+-----------+----------+-------+ Cephalic      Full                                          +----------+------------+---------+-----------+----------+-------+ Basilic       Full                                           +----------+------------+---------+-----------+----------+-------+  Left Findings: +----------+------------+---------+-----------+----------+-------+ LEFT      CompressiblePhasicitySpontaneousPropertiesSummary +----------+------------+---------+-----------+----------+-------+ Subclavian               Yes       Yes                      +----------+------------+---------+-----------+----------+-------+  Summary:  Right: No evidence of superficial vein thrombosis in the upper extremity. Findings consistent with acute deep vein thrombosis involving the right axillary vein and right brachial veins.  Left: No evidence of thrombosis in the subclavian.  *See table(s) above for measurements and observations.  Diagnosing physician: Servando Snare MD Electronically signed by Servando Snare MD on 11/03/2021 at 2:28:18 PM.    Final     Cardiac Studies     TEE   LEFT VENTRICLE: EF = 55%. No regional wall motion abnormalities.  RIGHT VENTRICLE: Markedly dilated. Severely HK   LEFT ATRIUM: Mildly dilated   LEFT ATRIAL APPENDAGE: No thrombus.    RIGHT ATRIUM: Massively dilated   AORTIC VALVE:  Trileaflet.  Mild calcification No AI/AS. No vegetation.    MITRAL VALVE:    Normal. Mild posterior MR. No vegetation.    TRICUSPID VALVE: Normal. Severe TR. No vegetation.    PULMONIC VALVE: Normal. Trivial PR. No vegetation.    INTERATRIAL SEPTUM: No PFO or ASD. Bulging R to L   PERICARDIUM: Moderate effusion around RA. No tamponade   DESCENDING AORTA: Mild plaque  Patient Profile     76 y.o. female with a hx of persistent atrial fibrillation, CHF, CKD, DM, hypertension, hypothyroidism, hyperlipidemia, sleep apnea who is being seen for the evaluation of CHF   Assessment & Plan    Acute on chronic right sided CHF  - Most recent echo from 08/2021 showed LVEF 55-60%, mild LVH, severely reduced RV systolic function  - TEE this admission with LVEF 55%, no regional  wall motion abnormalities. RV severely HD and markedly dilated  -  Continue IV diuresis with lasix 100 mg TID-- output 1.60 L urine yesterday, continues to be net +1.2 L since admission. Renal function stable. Continues to have ankle edema, abdominal distention.  - Patient cannot currently wear compression stockings due to leg wounds, but emphasized the importance of keeping feet elevated  - Continue low-dose metoprolol (helping to control HR while in afib)  Chronic atrial fibrillation  - Patient has been on IV heparin so far this admission  - Transition back to eliquis today-- of note, patient did have oral bleeding on xarelto and low dose eliquis. Did not have bleeding on IV heparin, so will re-challenge eliquis - Continue metoprolol 12.5 mg BID   Otherwise per primary  - Septic shock secondary to strep bacteremia  - acute metabolic encephalopathy  - longstanding ulcerations of bilateral lower extremities  - Nonhealing wound on L Knee       For questions or updates, please contact [facility] Please consult [email or web address] for contact info under        Signed, Margie Billet, PA-C  11/04/2021, 10:02 AM    History and all data above reviewed.  Patient examined.  I agree with the findings as above.  No pain.  No acute SOB.  Weak and somnolent.  The patient exam reveals COR:  Irregular   ,  Lungs: Decreased breath sounds at the bases  ,  Abd: Positive bowel sounds, no rebound no guarding, Ext:  Moderate edema improved.   .  All available labs, radiology testing, previous records reviewed. Agree with documented assessment and plan.   Atrial fib:  Change back to DOAC today.  Acute on chronic right sided HF:  Tolerating current diuretic.  Continue.    Minus Breeding  10:51 AM  11/04/2021

## 2021-11-04 NOTE — Progress Notes (Signed)
Physical Therapy Treatment Patient Details Name: Kristine [name] MRN: [id number] DOB: Aug 06, 1945 Today's Date: 11/04/2021   History of Present Illness 76 year old woman who [facility] with fatigue and AMS. PMHx significant for HTN, HLD, AF (previously on Eliquis), mild AS, CHF with cardiac amyloidosis (TTR, diagnosed 10/2021), COPD with chronic bronchitis, OSA, T2DM, hypothyroidism, CKD stage IV, GERD, depression/anxiety.  Recent hospitalization 6/23 with [facility] and PT.    PT Comments    Pt resting upon arrival to room, wakes easily. Pt tolerated repeated transfer training throughout session, improving from mod to light physical assist only to complete. Pt fatigues quickly and requires external cuing to rest. PT to conitnue to follow, family wants to take pt home but would like her to progress if possible.     Recommendations for follow up therapy are one component of a multi-disciplinary discharge planning process, led by the attending physician.  Recommendations may be updated based on patient status, additional functional criteria and insurance authorization.  Follow Up Recommendations  Home health PT     Assistance Recommended at Discharge Frequent or constant Supervision/Assistance  Patient can return home with the following A little help with walking and/or transfers;A little help with bathing/dressing/bathroom;Help with stairs or ramp for entrance;Assist for transportation   Equipment Recommendations  None recommended by PT    Recommendations for Other Services       Precautions / Restrictions Precautions Precautions: Fall Precaution Comments: open area to L plantar surface near great toe (mepilex applied) Restrictions Weight Bearing Restrictions: No Other Position/Activity Restrictions: Has shoes in room that need to be on when OOB     Mobility  Bed Mobility Overal bed mobility: Needs Assistance Bed Mobility: Supine to Sit, Sit to Supine     Supine to  sit: Mod assist Sit to supine: Mod assist   General bed mobility comments: assist for trunk and LE management, scooting to/from EOB.    Transfers Overall transfer level: Needs assistance Equipment used: Rolling walker (2 wheels) Transfers: Sit to/from Stand Sit to Stand: Mod assist, From elevated surface           General transfer comment: assist for power up, rise, steadying. Step pivot to/from [facility] with assist to steady and guide hips    Ambulation/Gait                   Stairs             Wheelchair Mobility    Modified Rankin (Stroke Patients Only)       Balance Overall balance assessment: Needs assistance Sitting-balance support: Feet supported, Bilateral upper extremity supported Sitting balance-Leahy Scale: Fair     Standing balance support: Bilateral upper extremity supported, Reliant on assistive device for balance Standing balance-Leahy Scale: Poor Standing balance comment: reliant on RW for balance                            Cognition Arousal/Alertness: Awake/alert Behavior During Therapy: Flat affect Overall Cognitive Status: Impaired/Different from baseline Area of Impairment: Problem solving, Following commands, Safety/judgement                       Following Commands: Follows one step commands with increased time, Follows one step commands consistently     Problem Solving: Slow processing General Comments: flat affect, increased processing time to follow commands        Exercises Other Exercises Other Exercises: sit<>stand  from EOB x2    General Comments        Pertinent Vitals/Pain Pain Assessment Pain Assessment: Faces Faces Pain Scale: Hurts a little bit Pain Location: back Pain Descriptors / Indicators: Grimacing, Guarding Pain Intervention(s): Limited activity within patient's tolerance, Monitored during session, Repositioned    Home Living                          Prior  Function            PT Goals (current goals can now be found in the care plan section) Acute Rehab PT Goals Patient Stated Goal: to get back home PT Goal Formulation: With patient/family Time For Goal Achievement: 11/13/21 Potential to Achieve Goals: Good Progress towards PT goals: Progressing toward goals    Frequency    Min 3X/week      PT Plan Current plan remains appropriate    Co-evaluation              AM-PAC PT "6 Clicks" Mobility   Outcome Measure  Help needed turning from your back to your side while in a flat bed without using bedrails?: A Little Help needed moving from lying on your back to sitting on the side of a flat bed without using bedrails?: A Little Help needed moving to and from a bed to a chair (including a wheelchair)?: A Little Help needed standing up from a chair using your arms (e.g., wheelchair or bedside chair)?: A Little Help needed to walk in hospital room?: A Lot Help needed climbing 3-5 steps with a railing? : A Lot 6 Click Score: 16    End of Session Equipment Utilized During Treatment: Gait belt Activity Tolerance: Patient limited by fatigue Patient left: in bed;with call bell/phone within reach;with bed alarm set;with family/visitor present Nurse Communication: Mobility status PT Visit Diagnosis: Other abnormalities of gait and mobility (R26.89);Muscle weakness (generalized) (M62.81)     Time: 9381-0175 PT Time Calculation (min) (ACUTE ONLY): 36 min  Charges:  $Therapeutic Activity: 23-37 mins                     Stacie Glaze, PT DPT Acute Rehabilitation Services Pager [phone]  Office [phone]    Louis Matte 11/04/2021, 4:39 PM

## 2021-11-04 NOTE — Progress Notes (Signed)
Diagnosis: Group b strep bacteremia; negative tee Left knee chronic hematoma extraarticular/drained several months prior to admission.  Left subarticular skin defect with surrounding pain/cellulitic change on admission ?source of gbs Mri this admission left knee extraarticular fluid collection negative cx per ir drainage  Given rather complicated left knee extraarticular injury and fluid collection will treat 3 weeks. She has 2 more weeks left. Oral abx at this time is ok    Culture Result: bcx gbs   Lab Results  Component Value Date   CREATININE 2.40 (H) 11/04/2021    Cefadroxil 500 mg po bid for 2 more weeks until 8/29 Discussed with primary team   Clinic Follow Up Appt: 8/29 @ 215pm with dr Baxter Flattery  @  [facility] [address], [place], [place] [address] Phone: [phone]     I spent more than 35 minute reviewing data/chart, and coordinating care and >50% direct face to face time providing counseling/discussing diagnostics/treatment plan with patient    ------------ Subjective: No discharge left anterior knee skin defect. Much improved pain/redness there Tee negative No f/c Getting chf managed currently    Exam: Vitals:   11/04/21 1213 11/04/21 1300  BP: (!) 102/52   Pulse: 82 98  Resp: 18 18  Temp: 97.7 F (36.5 C)   SpO2: 100% 92%   General/constitutional: no distress, pleasant HEENT: Normocephalic, PER, Conj Clear, EOMI, Oropharynx clear Neck supple CV: rrr no mrg Lungs: clear to auscultation, normal respiratory effort Abd: Soft, Nontender Ext: trace bilateral LE edema Skin/msk: hyperpigmentation bilateral LE LLE >> RLE; a small 0.5 skin defect clean base below left knee; left knee full nontender rom Neuro: nonfocal MSK: no peripheral joint swelling/tenderness/warmth; back spines nontender   Labs: Reviewed Lab Results  Component Value Date   WBC 10.4 11/04/2021   HGB 10.2 (L) 11/04/2021   HCT 31.5 (L) 11/04/2021   MCV 87.5  11/04/2021   PLT 119 (L) 45/85/9292   Last metabolic panel Lab Results  Component Value Date   GLUCOSE 188 (H) 11/04/2021   NA 133 (L) 11/04/2021   K 3.7 11/04/2021   CL 92 (L) 11/04/2021   CO2 30 11/04/2021   BUN 93 (H) 11/04/2021   CREATININE 2.40 (H) 11/04/2021   GFRNONAA 21 (L) 11/04/2021   CALCIUM 9.7 11/04/2021   PHOS 4.8 (H) 10/29/2021   PROT 5.7 (L) 10/29/2021   ALBUMIN 2.9 (L) 10/29/2021   LABGLOB 2.9 10/01/2021   LABGLOB 3.2 10/01/2021   AGRATIO 1.1 10/01/2021   BILITOT 2.1 (H) 10/29/2021   ALKPHOS 76 10/29/2021   AST 16 10/29/2021   ALT 9 10/29/2021   ANIONGAP 11 11/04/2021   Imaging: Reviewed

## 2021-11-05 ENCOUNTER — Encounter (HOSPITAL_COMMUNITY): Payer: Self-pay | Admitting: Internal Medicine

## 2021-11-05 DIAGNOSIS — R6521 Severe sepsis with septic shock: Secondary | ICD-10-CM | POA: Diagnosis not present

## 2021-11-05 DIAGNOSIS — J9601 Acute respiratory failure with hypoxia: Secondary | ICD-10-CM | POA: Diagnosis not present

## 2021-11-05 DIAGNOSIS — I4891 Unspecified atrial fibrillation: Secondary | ICD-10-CM | POA: Diagnosis not present

## 2021-11-05 DIAGNOSIS — E1142 Type 2 diabetes mellitus with diabetic polyneuropathy: Secondary | ICD-10-CM | POA: Diagnosis not present

## 2021-11-05 DIAGNOSIS — A419 Sepsis, unspecified organism: Secondary | ICD-10-CM | POA: Diagnosis not present

## 2021-11-05 DIAGNOSIS — Z794 Long term (current) use of insulin: Secondary | ICD-10-CM

## 2021-11-05 LAB — AEROBIC/ANAEROBIC CULTURE W GRAM STAIN (SURGICAL/DEEP WOUND)
Culture: NO GROWTH
Gram Stain: NONE SEEN

## 2021-11-05 LAB — CULTURE, BLOOD (ROUTINE X 2)
Culture: NO GROWTH
Culture: NO GROWTH
Special Requests: ADEQUATE

## 2021-11-05 LAB — CBC
HCT: 32 % — ABNORMAL LOW (ref 36.0–46.0)
Hemoglobin: 10.1 g/dL — ABNORMAL LOW (ref 12.0–15.0)
MCH: 27.8 pg (ref 26.0–34.0)
MCHC: 31.6 g/dL (ref 30.0–36.0)
MCV: 88.2 fL (ref 80.0–100.0)
Platelets: 109 10*3/uL — ABNORMAL LOW (ref 150–400)
RBC: 3.63 MIL/uL — ABNORMAL LOW (ref 3.87–5.11)
RDW: 16.4 % — ABNORMAL HIGH (ref 11.5–15.5)
WBC: 10.6 10*3/uL — ABNORMAL HIGH (ref 4.0–10.5)
nRBC: 0 % (ref 0.0–0.2)

## 2021-11-05 LAB — BASIC METABOLIC PANEL
Anion gap: 12 (ref 5–15)
BUN: 93 mg/dL — ABNORMAL HIGH (ref 8–23)
CO2: 29 mmol/L (ref 22–32)
Calcium: 9.5 mg/dL (ref 8.9–10.3)
Chloride: 90 mmol/L — ABNORMAL LOW (ref 98–111)
Creatinine, Ser: 2.61 mg/dL — ABNORMAL HIGH (ref 0.44–1.00)
GFR, Estimated: 19 mL/min — ABNORMAL LOW (ref >60)
Glucose, Bld: 239 mg/dL — ABNORMAL HIGH (ref 70–99)
Potassium: 4 mmol/L (ref 3.5–5.1)
Sodium: 131 mmol/L — ABNORMAL LOW (ref 135–145)

## 2021-11-05 LAB — GLUCOSE, CAPILLARY
Glucose-Capillary: 245 mg/dL — ABNORMAL HIGH (ref 70–99)
Glucose-Capillary: 326 mg/dL — ABNORMAL HIGH (ref 70–99)
Glucose-Capillary: 277 mg/dL — ABNORMAL HIGH (ref 70–99)
Glucose-Capillary: 174 mg/dL — ABNORMAL HIGH (ref 70–99)

## 2021-11-05 NOTE — Progress Notes (Signed)
Occupational Therapy Treatment Patient Details Name: Kristine [name] MRN: [id number] DOB: [birth year] Today's Date: 11/05/2021   History of present illness 76 year old woman who presented to [facility] 8/8 with fatigue and AMS. PMHx significant for HTN, HLD, AF (previously on Eliquis), mild AS, CHF with cardiac amyloidosis (TTR, diagnosed 10/2021), COPD with chronic bronchitis, OSA, T2DM, hypothyroidism, CKD stage IV, GERD, depression/anxiety.  Recent hospitalization 6/23 with [place] OT and PT.   OT comments  Patient awake but lethargic and in bed upon arrival.  Patient required mod assist and increased time to get to EOB. Patient was mod assist to stand from EOB and min assist to transfer to [facility].  Patient was mod assist to stand from [facility] and transferred back to EOB before transferring to recliner for safety and due to fatigue. Patient was min/mod assist to stand the third time and min assist to transfer to recliner.  Patient making gains with transfers and could benefit from further OT to increase independence and safety with transfers.    Recommendations for follow up therapy are one component of a multi-disciplinary discharge planning process, led by the attending physician.  Recommendations may be updated based on patient status, additional functional criteria and insurance authorization.    Follow Up Recommendations  Home health OT    Assistance Recommended at Discharge Intermittent Supervision/Assistance  Patient can return home with the following  A lot of help with bathing/dressing/bathroom;A little help with walking and/or transfers;Help with stairs or ramp for entrance;Assist for transportation;Assistance with cooking/housework   Equipment Recommendations  None recommended by OT    Recommendations for Other Services      Precautions / Restrictions Precautions Precautions: Fall Precaution Comments: open area to L plantar surface near great toe (mepilex applied) Restrictions Weight  Bearing Restrictions: No Other Position/Activity Restrictions: Has shoes in room that need to be on when OOB       Mobility Bed Mobility Overal bed mobility: Needs Assistance Bed Mobility: Supine to Sit     Supine to sit: Mod assist     General bed mobility comments: required assistance with trunk and BLE to get to EOB    Transfers Overall transfer level: Needs assistance Equipment used: Rolling walker (2 wheels) Transfers: Sit to/from Stand, Bed to chair/wheelchair/BSC Sit to Stand: Mod assist, From elevated surface     Step pivot transfers: Min assist     General transfer comment: mod assist to stand from EOB and step pivot to [facility]. Patient transferred back to EOB from [facility] before transferring to recliner due to fatigue     Balance Overall balance assessment: Needs assistance Sitting-balance support: Feet supported, Bilateral upper extremity supported Sitting balance-Leahy Scale: Fair Sitting balance - Comments: supervision due to lethargic   Standing balance support: Bilateral upper extremity supported, Reliant on assistive device for balance Standing balance-Leahy Scale: Poor Standing balance comment: reliant on RW for balance                           ADL either performed or assessed with clinical judgement   ADL Overall ADL's : Needs assistance/impaired     Grooming: Wash/dry hands;Wash/dry face;Set up;Sitting Grooming Details (indicate cue type and reason): in recliner                 Toilet Transfer: BSC/3in1;Rolling walker (2 wheels);Moderate assistance Toilet Transfer Details (indicate cue type and reason): transferred from EOB to [facility] with mod assist to power up and min assist to step  pivot due to assistance guiding walker Toileting- Clothing Manipulation and Hygiene: Minimal assistance         General ADL Comments: frequent cues during transfer to and from [facility]    Extremity/Trunk Assessment              Vision        Perception     Praxis      Cognition Arousal/Alertness: Lethargic Behavior During Therapy: Flat affect Overall Cognitive Status: Impaired/Different from baseline Area of Impairment: Problem solving, Following commands, Safety/judgement                 Orientation Level: Disoriented to, Time     Following Commands: Follows one step commands with increased time, Follows one step commands consistently     Problem Solving: Slow processing General Comments: lethargic but able to follow commands with increased time        Exercises      Shoulder Instructions       General Comments Stood from recliner to standing scale for weight with 295.9 lbs and 134.2 kgs    Pertinent Vitals/ Pain       Pain Assessment Pain Assessment: Faces Faces Pain Scale: Hurts a little bit Pain Location: back Pain Descriptors / Indicators: Grimacing, Guarding Pain Intervention(s): Limited activity within patient's tolerance, Monitored during session, Repositioned  Home Living                                          Prior Functioning/Environment              Frequency  Min 2X/week        Progress Toward Goals  OT Goals(current goals can now be found in the care plan section)  Progress towards OT goals: Progressing toward goals  Acute Rehab OT Goals Patient Stated Goal: go home OT Goal Formulation: With patient/family Time For Goal Achievement: 11/13/21 Potential to Achieve Goals: Good ADL Goals Pt Will Perform Grooming: with supervision;standing;sitting Pt Will Transfer to Toilet: with supervision;ambulating;regular height toilet Pt/caregiver will Perform Home Exercise Program: Increased strength;Both right and left upper extremity;With theraband;With written HEP provided  Plan Discharge plan remains appropriate    Co-evaluation                 AM-PAC OT "6 Clicks" Daily Activity     Outcome Measure   Help from another person eating meals?:  None Help from another person taking care of personal grooming?: None Help from another person toileting, which includes using toliet, bedpan, or urinal?: A Lot Help from another person bathing (including washing, rinsing, drying)?: A Lot Help from another person to put on and taking off regular upper body clothing?: A Little Help from another person to put on and taking off regular lower body clothing?: A Lot 6 Click Score: 17    End of Session Equipment Utilized During Treatment: Rolling walker (2 wheels)  OT Visit Diagnosis: Unsteadiness on feet (R26.81);Muscle weakness (generalized) (M62.81)   Activity Tolerance Patient limited by lethargy   Patient Left in chair;with call bell/phone within reach;with family/visitor present   Nurse Communication Mobility status;Other (comment) (patient's weight)        Time: 1517-6160 OT Time Calculation (min): 34 min  Charges: OT General Charges $OT Visit: 1 Visit OT Treatments $Self Care/Home Management : 23-37 mins  Lodema Hong, [place]  Office [phone]  Breckyn Ticas Alexis Goodell 11/05/2021, 11:33 AM

## 2021-11-05 NOTE — Plan of Care (Signed)

## 2021-11-05 NOTE — Inpatient Diabetes Management (Signed)
Inpatient Diabetes Program Recommendations  AACE/ADA: New Consensus Statement on Inpatient Glycemic Control (2015)  Target Ranges:  Prepandial:   less than 140 mg/dL      Peak postprandial:   less than 180 mg/dL (1-2 hours)      Critically ill patients:  140 - 180 mg/dL   Lab Results  Component Value Date   GLUCAP 245 (H) 11/05/2021   HGBA1C 7.0 (H) 09/11/2021    Review of Glycemic Control  Latest Reference Range & Units 11/04/21 07:33 11/04/21 12:12 11/04/21 16:17 11/04/21 20:58 11/05/21 08:08  Glucose-Capillary 70 - 99 mg/dL 192 (H) 185 (H) 214 (H) 248 (H) 245 (H)   Diabetes history: DM type 2 Outpatient Diabetes medications: Tresiba 90 units BID, Novolog 40-70 units sliding scale TID Current orders for Inpatient glycemic control:  Semglee 25 units daily Novolog 0-20 units TID + 0-5 units at [facility]  Inpatient Diabetes Program Recommendations:   CBGs have been greater than 200 mg/dl.  -   consider increasing Semglee to 30 units daily -   Consider adding Novolog 4 units TID with meals if eating at least 50% of meals   Thanks, Tama Headings RN, MSN, BC-ADM Inpatient Diabetes Coordinator Team Pager [phone] (8a-5p)

## 2021-11-05 NOTE — Plan of Care (Signed)

## 2021-11-05 NOTE — Progress Notes (Signed)
PROGRESS NOTE        PATIENT DETAILS Name: Kristine [name] Age: 76 y.o. Sex: female Date of Birth: January 19, 1946 Admit Date: 10/28/2021 Admitting Physician Collier Bullock, MD IOE:VOJJ, Ravisankar, MD  Brief Summary: Patient is a 76 y.o.  female with a history of persistent atrial fibrillation, cardiac amyloidosis, chronic right-sided CHF, COPD, CKD stage IV, HTN, hypothyroidism, hyperlipidemia-presented with weakness/fatigue-patient was found to have sepsis due to streptococcal bacteremia.  See below for further details.  Significant events: 8/8>> admit to ICU-septic shock requiring vasopressors. 8/10>> transfer to [facility].  Significant studies: 8/8>> CT head: No acute intracranial abnormality 8/8>> CT chest/abdomen: Changes of cirrhosis, anasarca leg edema throughout abdominal wall. 8/10> MRI left knee: Diffuse subcutaneous soft tissue swelling-small fluid collection near lateral retinaculum. 8/11>> MRI left foot: No obvious acute osseous abnormality. 8/11>> MRI right foot: No obvious acute osseous abnormality. 8/13>> Doppler right upper extremity: DVT involving right axillary/right brachial vein.  Significant microbiology data: 8/8>> COVID PCR: Negative 8/8>> blood culture: Group B strep agalactiae. 8/11>> blood culture: No growth 8/11>> abscess left knee: No growth  Procedures: 8/11>> ultrasound-guided aspiration of small abscess in the superficial soft tissue of the lateral aspect of the left knee. 8/14>>TEE-no vegetation  Consults: ID, plastics, cardiology, orthopedics  Subjective: Lying comfortably in bed-denies any chest pain or shortness of breath.  Objective: Vitals: Blood pressure (!) 101/56, pulse 95, temperature (!) 97.5 F (36.4 C), temperature source Oral, resp. rate 15, height '5\' 9"'$  (1.753 m), weight 135.8 kg, SpO2 94 %.   Exam: Gen Exam:Alert awake-not in any distress HEENT:atraumatic, normocephalic Chest: B/L clear to  auscultation anteriorly CVS:S1S2 regular Abdomen:soft non tender, non distended Extremities:++ edema Neurology: Non focal Skin: no rash  Pertinent Labs/Radiology:    Latest Ref Rng & Units 11/05/2021    4:37 AM 11/04/2021    4:55 AM 11/03/2021    9:38 AM  CBC  WBC 4.0 - 10.5 K/uL 10.6  10.4  10.6   Hemoglobin 12.0 - 15.0 g/dL 10.1  10.2  10.0   Hematocrit 36.0 - 46.0 % 32.0  31.5  31.1   Platelets 150 - 400 K/uL 109  119  118     Lab Results  Component Value Date   NA 131 (L) 11/05/2021   K 4.0 11/05/2021   CL 90 (L) 11/05/2021   CO2 29 11/05/2021      Assessment/Plan: Septic shock due to streptococcal agalactiae bacteremia: Sepsis physiology has resolved-initially on IV Ancef-plan is to transition to oral cefadroxil until 8/29.  Acute metabolic encephalopathy: Due to sepsis-mentation has improved-back to baseline.  Acute on chronic right-sided heart failure/HFpEF exacerbation: Remains volume overloaded-cardiology following-remains on IV Lasix.  Follow electrolytes/weights/intake/output.  History of cardiac amyloidosis: Cardiology following-defer further to cardiology.  Persistent atrial fibrillation: Rate controlled with metoprolol-on Eliquis.  AKI on CKD stage IV: Renal function slowly improving-continue to monitor closely while on IV Lasix.  Bilateral lower extremity ulcers/history of left knee hematoma-requiring evacuation and drain placement in March 2023: MRI left knee-MRI of bilateral foot without any evidence of infection.  HTN: BP stable-continue metoprolol.  DM-2: CBG stable-continue Semglee 25 units daily and SSI.  Recent Labs    11/04/21 2058 11/05/21 0808 11/05/21 1227  GLUCAP 248* 245* 326*     Hypothyroidism: Continue Synthroid  Gout: Continue allopurinol  GERD: Continue PPI  Peripheral neuropathy: Continue Lyrica-and as needed  tramadol  Depression/anxiety: Stable-continue cymbalta  Pressure Ulcer: Pressure Injury 10/29/21 Knee  Anterior;Left Stage 2 -  Partial thickness loss of dermis presenting as a shallow open injury with a red, pink wound bed without slough. (Active)  10/29/21 0100  Location: Knee  Location Orientation: Anterior;Left  Staging: Stage 2 -  Partial thickness loss of dermis presenting as a shallow open injury with a red, pink wound bed without slough.  Wound Description (Comments):   Present on Admission: Yes  Dressing Type Foam - Lift dressing to assess site every shift 11/05/21 0400     Pressure Injury 10/29/21 Other (Comment) Left Stage 2 -  Partial thickness loss of dermis presenting as a shallow open injury with a red, pink wound bed without slough. (Active)  10/29/21 0100  Location: Other (Comment)  Location Orientation: Left  Staging: Stage 2 -  Partial thickness loss of dermis presenting as a shallow open injury with a red, pink wound bed without slough.  Wound Description (Comments):   Present on Admission: Yes  Dressing Type Foam - Lift dressing to assess site every shift 11/05/21 0400     Pressure Injury 10/29/21 Toe (Comment  which one) Anterior;Right Stage 2 -  Partial thickness loss of dermis presenting as a shallow open injury with a red, pink wound bed without slough. (Active)  10/29/21 0100  Location: Toe (Comment  which one)  Location Orientation: Anterior;Right  Staging: Stage 2 -  Partial thickness loss of dermis presenting as a shallow open injury with a red, pink wound bed without slough.  Wound Description (Comments):   Present on Admission: Yes  Dressing Type Foam - Lift dressing to assess site every shift 11/05/21 0400    Morbid Obesity: Estimated body mass index is 44.21 kg/m as calculated from the following:   Height as of this encounter: '5\' 9"'$  (1.753 m).   Weight as of this encounter: 135.8 kg.   Code status:   Code Status: Partial Code   DVT Prophylaxis: apixaban (ELIQUIS) tablet 2.5 mg Start: 11/04/21 1645 SCDs Start: 10/29/21 0114 apixaban (ELIQUIS)  tablet 2.5 mg    Family Communication: Spouse at bedside   Disposition Plan: Status is: Inpatient Remains inpatient appropriate because: Volume overloaded-on IV Lasix.   Planned Discharge Destination:Home health   Diet: Diet Order             Diet heart healthy/carb modified Room service appropriate? Yes; Fluid consistency: Thin  Diet effective now                     Antimicrobial agents: Anti-infectives (From admission, onward)    Start     Dose/Rate Route Frequency Ordered Stop   10/30/21 1800  vancomycin (VANCOREADY) IVPB 1250 mg/250 mL  Status:  Discontinued        1,250 mg 166.7 mL/hr over 90 Minutes Intravenous Every 48 hours 10/29/21 0744 10/29/21 1340   10/29/21 2245  ceFAZolin (ANCEF) IVPB 2g/100 mL premix        2 g 200 mL/hr over 30 Minutes Intravenous Every 12 hours 10/29/21 2145     10/29/21 2100  ceFEPIme (MAXIPIME) 2 g in sodium chloride 0.9 % 100 mL IVPB  Status:  Discontinued        2 g 200 mL/hr over 30 Minutes Intravenous Every 24 hours 10/28/21 2202 10/29/21 1340   10/29/21 2000  penicillin G potassium 4 Million Units in dextrose 5 % 250 mL IVPB  Status:  Discontinued  4 Million Units 250 mL/hr over 60 Minutes Intravenous Every 8 hours 10/29/21 1401 10/29/21 2145   10/29/21 1500  penicillin G potassium 4 Million Units in dextrose 5 % 250 mL IVPB  Status:  Discontinued        4 Million Units 250 mL/hr over 60 Minutes Intravenous Every 8 hours 10/29/21 1340 10/29/21 1401   10/28/21 2201  vancomycin variable dose per unstable renal function (pharmacist dosing)  Status:  Discontinued         Does not apply See admin instructions 10/28/21 2202 10/29/21 0744   10/28/21 2130  vancomycin (VANCOCIN) IVPB 1000 mg/200 mL premix       See Hyperspace for full Linked Orders Report.   1,000 mg 200 mL/hr over 60 Minutes Intravenous  Once 10/28/21 2015 10/29/21 0049   10/28/21 2030  vancomycin (VANCOCIN) IVPB 1000 mg/200 mL premix       See Hyperspace for  full Linked Orders Report.   1,000 mg 200 mL/hr over 60 Minutes Intravenous  Once 10/28/21 2015 10/28/21 2323   10/28/21 2000  ceFEPIme (MAXIPIME) 2 g in sodium chloride 0.9 % 100 mL IVPB        2 g 200 mL/hr over 30 Minutes Intravenous  Once 10/28/21 1951 10/28/21 2152   10/28/21 2000  metroNIDAZOLE (FLAGYL) IVPB 500 mg        500 mg 100 mL/hr over 60 Minutes Intravenous  Once 10/28/21 1951 10/28/21 2224   10/28/21 2000  vancomycin (VANCOCIN) IVPB 1000 mg/200 mL premix  Status:  Discontinued        1,000 mg 200 mL/hr over 60 Minutes Intravenous  Once 10/28/21 1951 10/28/21 2015        MEDICATIONS: Scheduled Meds:  allopurinol  300 mg Oral q AM   apixaban  2.5 mg Oral BID   DULoxetine  30 mg Oral Daily   insulin aspart  0-20 Units Subcutaneous TID WC   insulin aspart  0-5 Units Subcutaneous QHS   insulin glargine-yfgn  25 Units Subcutaneous Daily   levothyroxine  88 mcg Oral q morning   metoprolol tartrate  12.5 mg Oral BID   pregabalin  50 mg Oral BID   Vitamin D (Ergocalciferol)  50,000 Units Oral Q7 days   Continuous Infusions:   ceFAZolin (ANCEF) IV 2 g (11/05/21 1223)   furosemide 100 mg (11/05/21 0554)   PRN Meds:.acetaminophen, ALPRAZolam, diphenhydrAMINE, docusate sodium, levalbuterol, mineral oil-hydrophilic petrolatum, mouth rinse, polyethylene glycol, traMADol   I have personally reviewed following labs and imaging studies  LABORATORY DATA: CBC: Recent Labs  Lab 11/01/21 0313 11/02/21 0326 11/03/21 0938 11/04/21 0455 11/05/21 0437  WBC 11.8* 9.8 10.6* 10.4 10.6*  HGB 10.1* 9.9* 10.0* 10.2* 10.1*  HCT 30.9* 31.5* 31.1* 31.5* 32.0*  MCV 88.3 89.7 89.1 87.5 88.2  PLT 218 123* 118* 119* 109*    Basic Metabolic Panel: Recent Labs  Lab 11/01/21 0313 11/02/21 0326 11/03/21 0938 11/04/21 0455 11/05/21 0437  NA 134* 135 133* 133* 131*  K 3.4* 3.6 3.9 3.7 4.0  CL 95* 94* 93* 92* 90*  CO2 '25 30 29 30 29  '$ GLUCOSE 115* 172* 173* 188* 239*  BUN 102*  99* 95* 93* 93*  CREATININE 2.66* 2.61* 2.43* 2.40* 2.61*  CALCIUM 9.4 9.6 9.5 9.7 9.5    GFR: Estimated Creatinine Clearance: 28.1 mL/min (A) (by C-G formula based on SCr of 2.61 mg/dL (H)).  Liver Function Tests: No results for input(s): "AST", "ALT", "ALKPHOS", "BILITOT", "PROT", "ALBUMIN" in the last  168 hours. No results for input(s): "LIPASE", "AMYLASE" in the last 168 hours. No results for input(s): "AMMONIA" in the last 168 hours.  Coagulation Profile: No results for input(s): "INR", "PROTIME" in the last 168 hours.  Cardiac Enzymes: No results for input(s): "CKTOTAL", "CKMB", "CKMBINDEX", "TROPONINI" in the last 168 hours.  BNP (last 3 results) No results for input(s): "PROBNP" in the last 8760 hours.  Lipid Profile: No results for input(s): "CHOL", "HDL", "LDLCALC", "TRIG", "CHOLHDL", "LDLDIRECT" in the last 72 hours.  Thyroid Function Tests: No results for input(s): "TSH", "T4TOTAL", "FREET4", "T3FREE", "THYROIDAB" in the last 72 hours.  Anemia Panel: No results for input(s): "VITAMINB12", "FOLATE", "FERRITIN", "TIBC", "IRON", "RETICCTPCT" in the last 72 hours.  Urine analysis:    Component Value Date/Time   COLORURINE YELLOW 10/28/2021 2140   APPEARANCEUR CLEAR 10/28/2021 2140   LABSPEC 1.020 10/28/2021 2140   PHURINE 5.0 10/28/2021 2140   GLUCOSEU NEGATIVE 10/28/2021 2140   HGBUR NEGATIVE 10/28/2021 2140   BILIRUBINUR NEGATIVE 10/28/2021 2140   KETONESUR NEGATIVE 10/28/2021 2140   PROTEINUR NEGATIVE 10/28/2021 2140   UROBILINOGEN 1.0 11/10/2013 2324   NITRITE NEGATIVE 10/28/2021 2140   LEUKOCYTESUR NEGATIVE 10/28/2021 2140    Sepsis Labs: Lactic Acid, Venous    Component Value Date/Time   LATICACIDVEN 1.6 10/29/2021 0439    MICROBIOLOGY: Recent Results (from the past 240 hour(s))  SARS Coronavirus 2 by RT PCR (hospital order, performed in [facility] hospital lab) *cepheid single result test* Anterior Nasal Swab     Status: None   Collection  Time: 10/28/21  6:50 PM   Specimen: Anterior Nasal Swab  Result Value Ref Range Status   SARS Coronavirus 2 by RT PCR NEGATIVE NEGATIVE Final    Comment: (NOTE) SARS-CoV-2 target nucleic acids are NOT DETECTED.  The SARS-CoV-2 RNA is generally detectable in upper and lower respiratory specimens during the acute phase of infection. The lowest concentration of SARS-CoV-2 viral copies this assay can detect is 250 copies / mL. A negative result does not preclude SARS-CoV-2 infection and should not be used as the sole basis for treatment or other patient management decisions.  A negative result may occur with improper specimen collection / handling, submission of specimen other than nasopharyngeal swab, presence of viral mutation(s) within the areas targeted by this assay, and inadequate number of viral copies (<250 copies / mL). A negative result must be combined with clinical observations, patient history, and epidemiological information.  Fact Sheet for Patients:   https://www.patel.info/  Fact Sheet for Healthcare Providers: https://hall.com/  This test is not yet approved or  cleared by the [place] FDA and has been authorized for detection and/or diagnosis of SARS-CoV-2 by FDA under an Emergency Use Authorization (EUA).  This EUA will remain in effect (meaning this test can be used) for the duration of the COVID-19 declaration under Section 564(b)(1) of the Act, 21 U.S.C. section 360bbb-3(b)(1), unless the authorization is terminated or revoked sooner.  Performed at [facility], [address] [place], [place] [address]   Culture, blood (Routine x 2)     Status: Abnormal   Collection Time: 10/28/21  7:20 PM   Specimen: Left Antecubital; Blood  Result Value Ref Range Status   Specimen Description   Final    LEFT ANTECUBITAL BLOOD Performed at [facility], [place] [address] [place], [place] [address]     Special Requests   Final    BOTTLES DRAWN AEROBIC AND ANAEROBIC Blood Culture adequate volume  Performed at [facility], [address] [place], [place] [address]    Culture  Setup Time   Final    GRAM POSITIVE COCCI IN BOTH AEROBIC AND ANAEROBIC BOTTLES CRITICAL RESULT CALLED TO, READ BACK BY AND VERIFIED WITH: PHARMD A. PAYTES [id number] '@1039'$  FH    Culture (A)  Final    GROUP B STREP(S.AGALACTIAE)ISOLATED SUSCEPTIBILITIES PERFORMED ON PREVIOUS CULTURE WITHIN THE LAST 5 DAYS. Performed at [facility], [place] [address], [place], [place] [address]    Report Status 10/31/2021 FINAL  Final  Culture, blood (Routine x 2)     Status: Abnormal   Collection Time: 10/28/21  7:25 PM   Specimen: BLOOD LEFT ARM  Result Value Ref Range Status   Specimen Description   Final    BLOOD LEFT ARM Performed at [facility], [address] [place], [place] [address]    Special Requests   Final    BOTTLES DRAWN AEROBIC ONLY Blood Culture results may not be optimal due to an inadequate volume of blood received in culture bottles Performed at [facility], [address] [place], [place] [address]    Culture  Setup Time   Final    GRAM POSITIVE COCCI IN CHAINS BOTTLES DRAWN AEROBIC ONLY CRITICAL RESULT CALLED TO, READ BACK BY AND VERIFIED WITH: PHARMD A. PAYTES [id number] '@1039'$  FH Performed at [place] [address] [place], [place] [address]    Culture GROUP B STREP(S.AGALACTIAE)ISOLATED (A)  Final   Report Status 10/31/2021 FINAL  Final   Organism ID, Bacteria GROUP B STREP(S.AGALACTIAE)ISOLATED  Final      Susceptibility   Group b strep(s.agalactiae)isolated - MIC*    CLINDAMYCIN >=1 RESISTANT Resistant     AMPICILLIN <=0.25 SENSITIVE Sensitive     ERYTHROMYCIN >=8 RESISTANT Resistant     VANCOMYCIN 0.5 SENSITIVE Sensitive     CEFTRIAXONE <=0.12 SENSITIVE Sensitive     LEVOFLOXACIN 0.5 SENSITIVE Sensitive     PENICILLIN Value in next row Sensitive       SENSITIVE0.06    * GROUP B STREP(S.AGALACTIAE)ISOLATED  Blood Culture ID Panel (Reflexed)     Status: Abnormal   Collection Time: 10/28/21  7:25 PM  Result Value Ref Range Status   Enterococcus faecalis NOT DETECTED NOT DETECTED Final   Enterococcus Faecium NOT DETECTED NOT DETECTED Final   Listeria monocytogenes NOT DETECTED NOT DETECTED Final   Staphylococcus species NOT DETECTED NOT DETECTED Final   Staphylococcus aureus (BCID) NOT DETECTED NOT DETECTED Final   Staphylococcus epidermidis NOT DETECTED NOT DETECTED Final   Staphylococcus lugdunensis NOT DETECTED NOT DETECTED Final   Streptococcus species DETECTED (A) NOT DETECTED Final    Comment: CRITICAL RESULT CALLED TO, READ BACK BY AND VERIFIED WITH: PHARMD A. PAYTES [id number] '@1039'$  FH    Streptococcus agalactiae DETECTED (A) NOT DETECTED Final    Comment: CRITICAL RESULT CALLED TO, READ BACK BY AND VERIFIED WITH: PHARMD A. PAYTES [id number] '@1039'$  FH    Streptococcus pneumoniae NOT DETECTED NOT DETECTED Final   Streptococcus pyogenes NOT DETECTED NOT DETECTED Final   A.calcoaceticus-baumannii NOT DETECTED NOT DETECTED Final   Bacteroides fragilis NOT DETECTED NOT DETECTED Final   Enterobacterales NOT DETECTED NOT DETECTED Final   Enterobacter cloacae complex NOT DETECTED NOT DETECTED Final   Escherichia coli NOT DETECTED NOT DETECTED Final   Klebsiella aerogenes NOT DETECTED NOT DETECTED Final   Klebsiella oxytoca NOT DETECTED NOT DETECTED Final   Klebsiella pneumoniae  NOT DETECTED NOT DETECTED Final   Proteus species NOT DETECTED NOT DETECTED Final   Salmonella species NOT DETECTED NOT DETECTED Final   Serratia marcescens NOT DETECTED NOT DETECTED Final   Haemophilus influenzae NOT DETECTED NOT DETECTED Final   Neisseria meningitidis NOT DETECTED NOT DETECTED Final   Pseudomonas aeruginosa NOT DETECTED NOT DETECTED Final   Stenotrophomonas maltophilia NOT DETECTED NOT DETECTED Final   Candida albicans NOT DETECTED NOT  DETECTED Final   Candida auris NOT DETECTED NOT DETECTED Final   Candida glabrata NOT DETECTED NOT DETECTED Final   Candida krusei NOT DETECTED NOT DETECTED Final   Candida parapsilosis NOT DETECTED NOT DETECTED Final   Candida tropicalis NOT DETECTED NOT DETECTED Final   Cryptococcus neoformans/gattii NOT DETECTED NOT DETECTED Final    Comment: Performed at [facility], [place] [address], [place], [place] [address]  MRSA Next Gen by PCR, Nasal     Status: None   Collection Time: 10/29/21  1:06 AM   Specimen: Nasal Mucosa; Nasal Swab  Result Value Ref Range Status   MRSA by PCR Next Gen NOT DETECTED NOT DETECTED Final    Comment: (NOTE) The GeneXpert MRSA Assay (FDA approved for NASAL specimens only), is one component of a comprehensive MRSA colonization surveillance program. It is not intended to diagnose MRSA infection nor to guide or monitor treatment for MRSA infections. Test performance is not FDA approved in patients less than 43 years old. Performed at [facility], [place] [address], [place], [place] [address]   Culture, blood (Routine X 2) w Reflex to ID Panel     Status: None   Collection Time: 10/31/21 11:44 AM   Specimen: BLOOD  Result Value Ref Range Status   Specimen Description BLOOD BLOOD RIGHT HAND  Final   Special Requests   Final    BOTTLES DRAWN AEROBIC AND ANAEROBIC Blood Culture results may not be optimal due to an inadequate volume of blood received in culture bottles   Culture   Final    NO GROWTH 5 DAYS Performed at [facility], [place] [address] [place], [place] [address]    Report Status 11/05/2021 FINAL  Final  Culture, blood (Routine X 2) w Reflex to ID Panel     Status: None   Collection Time: 10/31/21 11:56 AM   Specimen: BLOOD  Result Value Ref Range Status   Specimen Description BLOOD BLOOD LEFT HAND  Final   Special Requests   Final    BOTTLES DRAWN AEROBIC ONLY Blood Culture adequate volume   Culture   Final    NO GROWTH 5  DAYS Performed at [facility], [place] [address], [place], [place] [address]    Report Status 11/05/2021 FINAL  Final  Aerobic/Anaerobic Culture w Gram Stain (surgical/deep wound)     Status: None   Collection Time: 10/31/21  5:15 PM   Specimen: Abscess  Result Value Ref Range Status   Specimen Description ABSCESS LEFT KNEE  Final   Special Requests NONE  Final   Gram Stain   Final    NO ORGANISMS SEEN MODERATE WBC PRESENT, PREDOMINANTLY PMN    Culture   Final    No growth aerobically or anaerobically. Performed at [facility], [place] [address], [place],  [address]    Report Status 11/05/2021 FINAL  Final    RADIOLOGY STUDIES/RESULTS: No results found.   LOS: 7 days   Oren Binet, MD  [facility]  To contact the attending provider between 7A-7P or the covering provider during after hours 7P-7A, please log into the web site [email or web address] and access using universal [place] password for that web site. If you do not have the password, please call the hospital operator.  11/05/2021, 12:24 PM

## 2021-11-05 NOTE — Progress Notes (Addendum)
Progress Note  Patient Name: Kristine [name] Date of Encounter: 11/05/2021  [facility] Cardiologist: Glori Bickers, MD   Subjective   Patient continues to have ankle edema, feels like her abdominal distention is improving. Denies chest pain, sob. Does have occasional palpitations   Inpatient Medications    Scheduled Meds:  allopurinol  300 mg Oral q AM   apixaban  2.5 mg Oral BID   DULoxetine  30 mg Oral Daily   insulin aspart  0-20 Units Subcutaneous TID WC   insulin aspart  0-5 Units Subcutaneous QHS   insulin glargine-yfgn  25 Units Subcutaneous Daily   levothyroxine  88 mcg Oral q morning   metoprolol tartrate  12.5 mg Oral BID   pregabalin  50 mg Oral BID   Vitamin D (Ergocalciferol)  50,000 Units Oral Q7 days   Continuous Infusions:   ceFAZolin (ANCEF) IV 2 g (11/04/21 2122)   furosemide 100 mg (11/05/21 0554)   PRN Meds: acetaminophen, ALPRAZolam, diphenhydrAMINE, docusate sodium, levalbuterol, mineral oil-hydrophilic petrolatum, mouth rinse, polyethylene glycol, traMADol   Vital Signs    Vitals:   11/04/21 2301 11/05/21 0313 11/05/21 0421 11/05/21 0806  BP: 120/64 100/63  (!) 101/56  Pulse: 95 100  95  Resp: '18 18  15  '$ Temp: 98 F (36.7 C) 98.7 F (37.1 C)  (!) 97.5 F (36.4 C)  TempSrc: Oral Oral  Oral  SpO2:    94%  Weight:   135.8 kg   Height:        Intake/Output Summary (Last 24 hours) at 11/05/2021 1040 Last data filed at 11/05/2021 0300 Gross per 24 hour  Intake 1825.62 ml  Output 1450 ml  Net 375.62 ml      11/05/2021    4:21 AM 11/03/2021    4:17 AM 11/02/2021    4:45 AM  Last 3 Weights  Weight (lbs) 299 lb 6.2 oz 297 lb 9.9 oz 299 lb 2.6 oz  Weight (kg) 135.8 kg 135 kg 135.7 kg      Telemetry    Atrial fibrillation, HR in the 90s-100s. PVCs present  - Personally Reviewed  ECG    No new tracings - Personally Reviewed  Physical Exam   GEN: No acute distress. Sitting comfortably in the recliner   Neck: No  JVD Cardiac: Irregular rate and rhythm  Respiratory: Crackles in bilateral lung bases. Normal WOB on room air  GI: Soft, nontender, non-distended  MS: 2+ pitting edema in BLE Neuro:  Nonfocal  Psych: Normal affect   Labs    High Sensitivity Troponin:   Recent Labs  Lab 10/28/21 1945 10/28/21 2139  TROPONINIHS 53* 84*     Chemistry Recent Labs  Lab 11/03/21 0938 11/04/21 0455 11/05/21 0437  NA 133* 133* 131*  K 3.9 3.7 4.0  CL 93* 92* 90*  CO2 '29 30 29  '$ GLUCOSE 173* 188* 239*  BUN 95* 93* 93*  CREATININE 2.43* 2.40* 2.61*  CALCIUM 9.5 9.7 9.5  GFRNONAA 20* 21* 19*  ANIONGAP '11 11 12    '$ Lipids No results for input(s): "CHOL", "TRIG", "HDL", "LABVLDL", "LDLCALC", "CHOLHDL" in the last 168 hours.  Hematology Recent Labs  Lab 11/03/21 0938 11/04/21 0455 11/05/21 0437  WBC 10.6* 10.4 10.6*  RBC 3.49* 3.60* 3.63*  HGB 10.0* 10.2* 10.1*  HCT 31.1* 31.5* 32.0*  MCV 89.1 87.5 88.2  MCH 28.7 28.3 27.8  MCHC 32.2 32.4 31.6  RDW 16.7* 16.5* 16.4*  PLT 118* 119* 109*   Thyroid No  results for input(s): "TSH", "FREET4" in the last 168 hours.  BNPNo results for input(s): "BNP", "PROBNP" in the last 168 hours.  DDimer No results for input(s): "DDIMER" in the last 168 hours.   Radiology    No results found.  Cardiac Studies     TEE   LEFT VENTRICLE: EF = 55%. No regional wall motion abnormalities.  RIGHT VENTRICLE: Markedly dilated. Severely HK   LEFT ATRIUM: Mildly dilated   LEFT ATRIAL APPENDAGE: No thrombus.    RIGHT ATRIUM: Massively dilated   AORTIC VALVE:  Trileaflet.  Mild calcification No AI/AS. No vegetation.    MITRAL VALVE:    Normal. Mild posterior MR. No vegetation.    TRICUSPID VALVE: Normal. Severe TR. No vegetation.    PULMONIC VALVE: Normal. Trivial PR. No vegetation.    INTERATRIAL SEPTUM: No PFO or ASD. Bulging R to L   PERICARDIUM: Moderate effusion around RA. No tamponade   DESCENDING AORTA: Mild plaque    Patient Profile      76 y.o. female with a hx of persistent atrial fibrillation, CHF, CKD, DM, hypertension, hypothyroidism, hyperlipidemia, sleep apnea who is being seen for the evaluation of CHF   Assessment & Plan    Acute on chronic right sided CHF  - Most recent echo from 08/2021 showed LVEF 55-60%, mild LVH, severely reduced RV systolic function  - TEE this admission with LVEF 55%, no regional wall motion abnormalities. RV severely HD and markedly dilated  - Continue IV diuresis with lasix 100 mg TID-- output 1.45 L urine yesterday, continues to be net +1.8 L since admission as she is receiving IV medications. Hopefully her volume status will iprove now that she is off IV heparin and should only get IV piggyback fluids with lasix and cefazolin.  Renal function stable. - Patient cannot currently wear compression stockings due to leg wounds, but emphasized the importance of keeping feet elevated  - Continue low-dose metoprolol (helping to control HR while in afib)   Chronic atrial fibrillation  - Per telemetry, patient is in atrial fibrillation, HR in the 90s. PVCs present  - Now back on eliquis 2.5 mg BID  - Continue metoprolol 12.5 mg BID    Otherwise per primary  - Septic shock secondary to strep bacteremia  - acute metabolic encephalopathy  - longstanding ulcerations of bilateral lower extremities  - Nonhealing wound on L Knee      For questions or updates, please contact [facility] Please consult [email or web address] for contact info under     Signed, Margie Billet, PA-C  11/05/2021, 10:40 AM    History and all data above reviewed.  Patient examined.  I agree with the findings as above.  She is fatigued.   The patient exam reveals COR  Irregular  ,  Lungs: Decreased breath sounds  ,  Abd: Obese, Positive bowel sounds, no rebound no guarding, Ext Moderate leg swelling.    .  All available labs, radiology testing, previous records reviewed. Agree with documented assessment and plan. Acute on  chronic RV systolic failure and chronic LV diastolic failure/pulmonary HTN:  Creat increased today so we might be bumping up against the limit of aggressive diuresis.  Check BMET tomorrow and we will consider reducing diuretics based on this.     Jeneen Rinks Abagael Kramm  12:24 PM  11/05/2021

## 2021-11-06 ENCOUNTER — Telehealth (HOSPITAL_COMMUNITY): Payer: Self-pay

## 2021-11-06 DIAGNOSIS — J9601 Acute respiratory failure with hypoxia: Secondary | ICD-10-CM | POA: Diagnosis not present

## 2021-11-06 DIAGNOSIS — A419 Sepsis, unspecified organism: Secondary | ICD-10-CM | POA: Diagnosis not present

## 2021-11-06 DIAGNOSIS — R6521 Severe sepsis with septic shock: Secondary | ICD-10-CM | POA: Diagnosis not present

## 2021-11-06 DIAGNOSIS — I4891 Unspecified atrial fibrillation: Secondary | ICD-10-CM | POA: Diagnosis not present

## 2021-11-06 DIAGNOSIS — E1142 Type 2 diabetes mellitus with diabetic polyneuropathy: Secondary | ICD-10-CM | POA: Diagnosis not present

## 2021-11-06 LAB — BASIC METABOLIC PANEL
Anion gap: 11 (ref 5–15)
BUN: 88 mg/dL — ABNORMAL HIGH (ref 8–23)
CO2: 30 mmol/L (ref 22–32)
Calcium: 9.7 mg/dL (ref 8.9–10.3)
Chloride: 91 mmol/L — ABNORMAL LOW (ref 98–111)
Creatinine, Ser: 2.62 mg/dL — ABNORMAL HIGH (ref 0.44–1.00)
GFR, Estimated: 19 mL/min — ABNORMAL LOW (ref >60)
Glucose, Bld: 159 mg/dL — ABNORMAL HIGH (ref 70–99)
Potassium: 3.9 mmol/L (ref 3.5–5.1)
Sodium: 132 mmol/L — ABNORMAL LOW (ref 135–145)

## 2021-11-06 LAB — CBC
HCT: 29.3 % — ABNORMAL LOW (ref 36.0–46.0)
Hemoglobin: 9.6 g/dL — ABNORMAL LOW (ref 12.0–15.0)
MCH: 28.7 pg (ref 26.0–34.0)
MCHC: 32.8 g/dL (ref 30.0–36.0)
MCV: 87.5 fL (ref 80.0–100.0)
Platelets: 110 10*3/uL — ABNORMAL LOW (ref 150–400)
RBC: 3.35 MIL/uL — ABNORMAL LOW (ref 3.87–5.11)
RDW: 16.6 % — ABNORMAL HIGH (ref 11.5–15.5)
WBC: 11.5 10*3/uL — ABNORMAL HIGH (ref 4.0–10.5)
nRBC: 0.2 % (ref 0.0–0.2)

## 2021-11-06 LAB — GLUCOSE, CAPILLARY
Glucose-Capillary: 196 mg/dL — ABNORMAL HIGH (ref 70–99)
Glucose-Capillary: 240 mg/dL — ABNORMAL HIGH (ref 70–99)
Glucose-Capillary: 233 mg/dL — ABNORMAL HIGH (ref 70–99)
Glucose-Capillary: 179 mg/dL — ABNORMAL HIGH (ref 70–99)

## 2021-11-06 MED ORDER — CEFADROXIL 500 MG PO CAPS
500.0000 mg | ORAL_CAPSULE | Freq: Two times a day (BID) | ORAL | Status: DC
Start: 2021-11-06 — End: 2021-11-10
  Administered 2021-11-06 – 2021-11-10 (×8): 500 mg via ORAL
  Filled 2021-11-06 (×9): qty 1

## 2021-11-06 NOTE — Progress Notes (Signed)
Physical Therapy Treatment Patient Details Name: Kristine [name] MRN: [id number] DOB: 1946/02/12 Today's Date: 11/06/2021   History of Present Illness 76 year old woman who presented to [facility] 8/8 with fatigue and AMS. PMHx significant for HTN, HLD, AF (previously on Eliquis), mild AS, CHF with cardiac amyloidosis (TTR, diagnosed 10/2021), COPD with chronic bronchitis, OSA, T2DM, hypothyroidism, CKD stage IV, GERD, depression/anxiety.  Recent hospitalization 6/23 with [facility] and PT.    PT Comments    Pt drowsy throughout session with several instances where pt appears to fall asleep. Pt continues to require light to moderate physical assist for transfer-level mobility, and could not progress to short-distance gait training today secondary to fatigue. PT did challenge pt to stand >2 minutes before pt fatigued. Per pt, she is mostly transfer-level at home with periods of short distance gait, will continue to work towards this.      Recommendations for follow up therapy are one component of a multi-disciplinary discharge planning process, led by the attending physician.  Recommendations may be updated based on patient status, additional functional criteria and insurance authorization.  Follow Up Recommendations  Home health PT     Assistance Recommended at Discharge Frequent or constant Supervision/Assistance  Patient can return home with the following A little help with walking and/or transfers;A little help with bathing/dressing/bathroom;Help with stairs or ramp for entrance;Assist for transportation   Equipment Recommendations       Recommendations for Other Services       Precautions / Restrictions Precautions Precautions: Fall Precaution Comments: open area to L plantar surface near great toe - dressing donned Restrictions Weight Bearing Restrictions: No     Mobility  Bed Mobility Overal bed mobility: Needs Assistance Bed Mobility: Supine to Sit     Supine to sit: Mod  assist     General bed mobility comments: assist for LE progression and trunk elevation, cues for sequencing and use of bedrails to assist.    Transfers Overall transfer level: Needs assistance Equipment used: Rolling walker (2 wheels) Transfers: Sit to/from Stand, Bed to chair/wheelchair/BSC Sit to Stand: Mod assist   Step pivot transfers: Min assist       General transfer comment: assist for power up, steadying, weight shifting L and R in order to take pivotal steps to pt recliner. Total standing time 2 minutes before fatiguing and needing to sit.    Ambulation/Gait               General Gait Details: nt   Marine scientist Rankin (Stroke Patients Only)       Balance Overall balance assessment: Needs assistance Sitting-balance support: Feet supported, Bilateral upper extremity supported Sitting balance-Leahy Scale: Fair     Standing balance support: Bilateral upper extremity supported, Reliant on assistive device for balance Standing balance-Leahy Scale: Poor Standing balance comment: reliant on RW for balance                            Cognition Arousal/Alertness: Lethargic Behavior During Therapy: Flat affect Overall Cognitive Status: Impaired/Different from baseline Area of Impairment: Problem solving, Following commands, Safety/judgement                 Orientation Level: Disoriented to, Time     Following Commands: Follows one step commands with increased time, Follows one step commands consistently     Problem Solving: Slow processing  General Comments: drowsy throughout session, frequent periods where it appears pt falls asleep and has to be prompted awake. Pt states the year is 2020, otherwise oriented, Very increased processing time        Exercises      General Comments        Pertinent Vitals/Pain Pain Assessment Pain Assessment: Faces Faces Pain Scale: No hurt Pain  Intervention(s): Monitored during session    Home Living                          Prior Function            PT Goals (current goals can now be found in the care plan section) Acute Rehab PT Goals Patient Stated Goal: to get back home PT Goal Formulation: With patient/family Time For Goal Achievement: 11/13/21 Potential to Achieve Goals: Good Progress towards PT goals: Progressing toward goals    Frequency    Min 3X/week      PT Plan Current plan remains appropriate    Co-evaluation              AM-PAC PT "6 Clicks" Mobility   Outcome Measure  Help needed turning from your back to your side while in a flat bed without using bedrails?: A Lot Help needed moving from lying on your back to sitting on the side of a flat bed without using bedrails?: A Lot Help needed moving to and from a bed to a chair (including a wheelchair)?: A Lot Help needed standing up from a chair using your arms (e.g., wheelchair or bedside chair)?: A Lot Help needed to walk in hospital room?: A Lot Help needed climbing 3-5 steps with a railing? : A Lot 6 Click Score: 12    End of Session   Activity Tolerance: Patient limited by fatigue Patient left: in chair;with call bell/phone within reach;with chair alarm set Nurse Communication: Mobility status;Other (comment) (bari stedy for back to bed) PT Visit Diagnosis: Other abnormalities of gait and mobility (R26.89);Muscle weakness (generalized) (M62.81)     Time: 1311-1330 PT Time Calculation (min) (ACUTE ONLY): 19 min  Charges:  $Therapeutic Activity: 8-22 mins                     Stacie Glaze, PT DPT Acute Rehabilitation Services Pager [phone]  Office [phone]    Roxine Caddy E Ruffin Pyo 11/06/2021, 4:14 PM

## 2021-11-06 NOTE — Inpatient Diabetes Management (Signed)
Inpatient Diabetes Program Recommendations  AACE/ADA: New Consensus Statement on Inpatient Glycemic Control (2015)  Target Ranges:  Prepandial:   less than 140 mg/dL      Peak postprandial:   less than 180 mg/dL (1-2 hours)      Critically ill patients:  140 - 180 mg/dL   Lab Results  Component Value Date   GLUCAP 196 (H) 11/06/2021   HGBA1C 7.0 (H) 09/11/2021    Review of Glycemic Control  Latest Reference Range & Units 11/05/21 08:08 11/05/21 12:27 11/05/21 15:57 11/05/21 21:56 11/06/21 08:22  Glucose-Capillary 70 - 99 mg/dL 245 (H) 326 (H) 277 (H) 174 (H) 196 (H)   Diabetes history: DM type 2 Outpatient Diabetes medications: Tresiba 90 units BID, Novolog 40-70 units sliding scale TID Current orders for Inpatient glycemic control:  Semglee 25 units daily Novolog 0-20 units TID + 0-5 units at [facility]  Inpatient Diabetes Program Recommendations:   CBGs have been greater than 200 mg/dl.  -   consider increasing Semglee to 30 units daily -   Consider adding Novolog 4 units TID with meals if eating at least 50% of meals   Thanks, Tama Headings RN, MSN, BC-ADM Inpatient Diabetes Coordinator Team Pager [phone] (8a-5p)

## 2021-11-06 NOTE — Progress Notes (Addendum)
Progress Note  Patient Name: Kristine [name] Date of Encounter: 11/06/2021  [facility] Cardiologist: Glori Bickers, MD   Subjective   Patient denies chest pain, sob. Believes her ankle edema is back to baseline   Inpatient Medications    Scheduled Meds:  allopurinol  300 mg Oral q AM   apixaban  2.5 mg Oral BID   DULoxetine  30 mg Oral Daily   insulin aspart  0-20 Units Subcutaneous TID WC   insulin aspart  0-5 Units Subcutaneous QHS   insulin glargine-yfgn  25 Units Subcutaneous Daily   levothyroxine  88 mcg Oral q morning   metoprolol tartrate  12.5 mg Oral BID   pregabalin  50 mg Oral BID   Vitamin D (Ergocalciferol)  50,000 Units Oral Q7 days   Continuous Infusions:   ceFAZolin (ANCEF) IV 2 g (11/06/21 0932)   furosemide 100 mg (11/06/21 0511)   PRN Meds: acetaminophen, ALPRAZolam, diphenhydrAMINE, docusate sodium, levalbuterol, mineral oil-hydrophilic petrolatum, mouth rinse, polyethylene glycol, traMADol   Vital Signs    Vitals:   11/06/21 0355 11/06/21 0500 11/06/21 0825 11/06/21 0826  BP: 105/62   105/66  Pulse:    94  Resp: 16   14  Temp: 97.7 F (36.5 C)     TempSrc: Oral     SpO2:   92%   Weight:  134.9 kg    Height:        Intake/Output Summary (Last 24 hours) at 11/06/2021 1005 Last data filed at 11/06/2021 4128 Gross per 24 hour  Intake --  Output 2500 ml  Net -2500 ml      11/06/2021    5:00 AM 11/05/2021    4:21 AM 11/03/2021    4:17 AM  Last 3 Weights  Weight (lbs) 297 lb 8 oz 299 lb 6.2 oz 297 lb 9.9 oz  Weight (kg) 134.945 kg 135.8 kg 135 kg      Telemetry    Atrial fibrillation, HR in the 80s-90s - Personally Reviewed  ECG     - Personally Reviewed  Physical Exam   GEN: No acute distress.  Laying comfortably in the bed with head elevated  Neck: Body habitus makes JVD difficult to assess  Cardiac: Irregular rate and rhythm, no murmurs. Radial pulses 2+ bilaterally  Respiratory: Diminished breath sound  GI:  Soft, nontender, non-distended  MS: 2+ pitting edema in BLE; No deformity. Neuro:  Nonfocal  Psych: Normal affect   Labs    High Sensitivity Troponin:   Recent Labs  Lab 10/28/21 1945 10/28/21 2139  TROPONINIHS 53* 84*     Chemistry Recent Labs  Lab 11/04/21 0455 11/05/21 0437 11/06/21 0334  NA 133* 131* 132*  K 3.7 4.0 3.9  CL 92* 90* 91*  CO2 '30 29 30  '$ GLUCOSE 188* 239* 159*  BUN 93* 93* 88*  CREATININE 2.40* 2.61* 2.62*  CALCIUM 9.7 9.5 9.7  GFRNONAA 21* 19* 19*  ANIONGAP '11 12 11    '$ Lipids No results for input(s): "CHOL", "TRIG", "HDL", "LABVLDL", "LDLCALC", "CHOLHDL" in the last 168 hours.  Hematology Recent Labs  Lab 11/04/21 0455 11/05/21 0437 11/06/21 0334  WBC 10.4 10.6* 11.5*  RBC 3.60* 3.63* 3.35*  HGB 10.2* 10.1* 9.6*  HCT 31.5* 32.0* 29.3*  MCV 87.5 88.2 87.5  MCH 28.3 27.8 28.7  MCHC 32.4 31.6 32.8  RDW 16.5* 16.4* 16.6*  PLT 119* 109* 110*   Thyroid No results for input(s): "TSH", "FREET4" in the last 168 hours.  BNPNo  results for input(s): "BNP", "PROBNP" in the last 168 hours.  DDimer No results for input(s): "DDIMER" in the last 168 hours.   Radiology    No results found.  Cardiac Studies     TEE   LEFT VENTRICLE: EF = 55%. No regional wall motion abnormalities.  RIGHT VENTRICLE: Markedly dilated. Severely HK   LEFT ATRIUM: Mildly dilated   LEFT ATRIAL APPENDAGE: No thrombus.    RIGHT ATRIUM: Massively dilated   AORTIC VALVE:  Trileaflet.  Mild calcification No AI/AS. No vegetation.    MITRAL VALVE:    Normal. Mild posterior MR. No vegetation.    TRICUSPID VALVE: Normal. Severe TR. No vegetation.    PULMONIC VALVE: Normal. Trivial PR. No vegetation.    INTERATRIAL SEPTUM: No PFO or ASD. Bulging R to L   PERICARDIUM: Moderate effusion around RA. No tamponade   DESCENDING AORTA: Mild plaque  Patient Profile     76 y.o. female with a hx of persistent atrial fibrillation, CHF, CKD, DM, hypertension, hypothyroidism,  hyperlipidemia, sleep apnea who is being seen for the evaluation of CHF   Assessment & Plan    Acute on chronic right sided CHF  - Most recent echo from 08/2021 showed LVEF 55-60%, mild LVH, severely reduced RV systolic function  - TEE this admission with LVEF 55%, no regional wall motion abnormalities. RV severely HD and markedly dilated  - Patient has been on IV diuresis with lasix 100 mg TID-- output 2.5 L urine yesterday. Currently net -0.67 L since admission. Creatinine stable at 2.62 (was 2.61 yesterday). - Patient and husband report that her dry weight is 377 lbs, however review of past weights suggest that dry weight is closer to 283 lbs. Weight 297.5 lbs today.  - Continue IV lasix 100 mg TID for now, but I suspect we will decrease dose soon (possibly tomorrow pending morning BMP)  - Of note, patient was on high doses of diuretics PTA-- took 120 mg lasix BID, metolazone 2.5 mg on mon/wed/fri  - Patient cannot currently wear compression stockings due to leg wounds, but emphasized the importance of keeping feet elevated  - Continue low-dose metoprolol (helping to control HR while in afib)   Chronic atrial fibrillation  - Per telemetry, patient is in atrial fibrillation, HR in the 90s. PVCs present  - Now back on eliquis 2.5 mg BID -- patient has not had any oral bleeding since resuming eliquis  - Continue metoprolol 12.5 mg BID   RUE DVT  - Noted on doppler 8/13 - Was on IV heparin, now has been transitioned to eliquis     Otherwise per primary  - Septic shock secondary to strep bacteremia  - acute metabolic encephalopathy  - longstanding ulcerations of bilateral lower extremities  - Nonhealing wound on L Knee         For questions or updates, please contact [facility] Please consult [email or web address] for contact info under        Signed, Margie Billet, PA-C  11/06/2021, 10:05 AM    History and all data above reviewed.  Patient examined.  I agree with the findings  as above.  Fatigued.  Denies SOB or pain.  The patient exam reveals [id number], systolic murmur unchanged  ,  Lungs: Decreased breath sounds at the bases  ,  Abd: Positive bowel sounds, no rebound no guarding, Ext :  Mild leg and dependent edema  .  All available labs, radiology testing, previous records reviewed. Agree with documented assessment and  plan.   Acute right sided HF/Pulm HTN/LV diastolic dysfunction:  Still with good diuresis and her creat is unchanged.  Continue current therapy today again and check BMET in AM.  Jeneen Rinks Lavana Huckeba  1:39 PM  11/06/2021

## 2021-11-06 NOTE — Progress Notes (Signed)
PROGRESS NOTE        PATIENT DETAILS Name: Kristine [name] Age: 76 y.o. Sex: female Date of Birth: [birth year] Admit Date: 10/28/2021 Admitting Physician Collier Bullock, MD BSJ:[id number], Ravisankar, MD  Brief Summary: Patient is a 76 y.o.  female with a history of persistent atrial fibrillation, cardiac amyloidosis, chronic right-sided CHF, COPD, CKD stage IV, HTN, hypothyroidism, hyperlipidemia-presented with weakness/fatigue-patient was found to have sepsis due to streptococcal bacteremia.  See below for further details.  Significant events: 8/8>> admit to ICU-septic shock requiring vasopressors. 8/10>> transfer to [facility].  Significant studies: 8/8>> CT head: No acute intracranial abnormality 8/8>> CT chest/abdomen: Changes of cirrhosis, anasarca leg edema throughout abdominal wall. 8/10> MRI left knee: Diffuse subcutaneous soft tissue swelling-small fluid collection near lateral retinaculum. 8/11>> MRI left foot: No obvious acute osseous abnormality. 8/11>> MRI right foot: No obvious acute osseous abnormality. 8/13>> Doppler right upper extremity: DVT involving right axillary/right brachial vein.  Significant microbiology data: 8/8>> COVID PCR: Negative 8/8>> blood culture: Group B strep agalactiae. 8/11>> blood culture: No growth 8/11>> abscess left knee: No growth  Procedures: 8/11>> ultrasound-guided aspiration of small abscess in the superficial soft tissue of the lateral aspect of the left knee. 8/14>>TEE-no vegetation  Consults: ID, plastics, cardiology, orthopedics  Subjective: No major issues overnight-still with some swelling in her lower extremities.  Objective: Vitals: Blood pressure 105/66, pulse 94, temperature 97.7 F (36.5 C), temperature source Oral, resp. rate 14, height '5\' 9"'$  (1.753 m), weight 134.9 kg, SpO2 92 %.   Exam: Gen Exam:Alert awake-not in any distress HEENT:atraumatic, normocephalic Chest: B/L clear to  auscultation anteriorly CVS:S1S2 regular Abdomen:soft non tender, non distended Extremities:++ edema Neurology: Non focal Skin: no rash    Pertinent Labs/Radiology:    Latest Ref Rng & Units 11/06/2021    3:34 AM 11/05/2021    4:37 AM 11/04/2021    4:55 AM  CBC  WBC 4.0 - 10.5 K/uL 11.5  10.6  10.4   Hemoglobin 12.0 - 15.0 g/dL 9.6  10.1  10.2   Hematocrit 36.0 - 46.0 % 29.3  32.0  31.5   Platelets 150 - 400 K/uL 110  109  119     Lab Results  Component Value Date   NA 132 (L) 11/06/2021   K 3.9 11/06/2021   CL 91 (L) 11/06/2021   CO2 30 11/06/2021      Assessment/Plan: Septic shock due to streptococcal agalactiae bacteremia: Sepsis physiology has resolved-initially on IV Ancef-okay to transition to oral cefadroxil until 8/29 as recommended by ID.   Acute metabolic encephalopathy: Due to sepsis-mentation has improved-back to baseline.  Acute on chronic right-sided heart failure/HFpEF exacerbation: Continues to remain volume overloaded-cardiology following-remains on IV Lasix.  Continue to follow electrolytes/weights/intake/output.   History of cardiac amyloidosis: Cardiology following-defer further to cardiology.  Persistent atrial fibrillation: Rate controlled with metoprolol-on Eliquis.  AKI on CKD stage IV: Renal function relatively stable-continue to closely watch while on IV Lasix.    Bilateral lower extremity ulcers/history of left knee hematoma-requiring evacuation and drain placement in March 2023: MRI left knee-MRI of bilateral foot without any evidence of infection.  Fluid from soft tissue in left knee that was aspirated negative for infection as well.  HTN: BP stable-continue metoprolol.  DM-2: CBG stable-continue Semglee 25 units daily and SSI.  Recent Labs    11/05/21 2156 11/06/21 0822 11/06/21 1139  GLUCAP 174* 196* 240*      Hypothyroidism: Continue Synthroid  Gout: Continue allopurinol  GERD: Continue PPI  Peripheral neuropathy: Continue  Lyrica-and as needed tramadol  Depression/anxiety: Stable-continue cymbalta  Pressure Ulcer: Pressure Injury 10/29/21 Knee Anterior;Left Stage 2 -  Partial thickness loss of dermis presenting as a shallow open injury with a red, pink wound bed without slough. (Active)  10/29/21 0100  Location: Knee  Location Orientation: Anterior;Left  Staging: Stage 2 -  Partial thickness loss of dermis presenting as a shallow open injury with a red, pink wound bed without slough.  Wound Description (Comments):   Present on Admission: Yes  Dressing Type Foam - Lift dressing to assess site every shift 11/05/21 2000     Pressure Injury 10/29/21 Other (Comment) Left Stage 2 -  Partial thickness loss of dermis presenting as a shallow open injury with a red, pink wound bed without slough. (Active)  10/29/21 0100  Location: Other (Comment)  Location Orientation: Left  Staging: Stage 2 -  Partial thickness loss of dermis presenting as a shallow open injury with a red, pink wound bed without slough.  Wound Description (Comments):   Present on Admission: Yes  Dressing Type Foam - Lift dressing to assess site every shift 11/05/21 2000     Pressure Injury 10/29/21 Toe (Comment  which one) Anterior;Right Stage 2 -  Partial thickness loss of dermis presenting as a shallow open injury with a red, pink wound bed without slough. (Active)  10/29/21 0100  Location: Toe (Comment  which one)  Location Orientation: Anterior;Right  Staging: Stage 2 -  Partial thickness loss of dermis presenting as a shallow open injury with a red, pink wound bed without slough.  Wound Description (Comments):   Present on Admission: Yes  Dressing Type Foam - Lift dressing to assess site every shift 11/05/21 2000    Morbid Obesity: Estimated body mass index is 43.93 kg/m as calculated from the following:   Height as of this encounter: '5\' 9"'$  (1.753 m).   Weight as of this encounter: 134.9 kg.   Code status:   Code Status: Partial  Code   DVT Prophylaxis: apixaban (ELIQUIS) tablet 2.5 mg Start: 11/04/21 1645 SCDs Start: 10/29/21 0114 apixaban (ELIQUIS) tablet 2.5 mg    Family Communication: Spouse at bedside   Disposition Plan: Status is: Inpatient Remains inpatient appropriate because: Volume overloaded-on IV Lasix.   Planned Discharge Destination:Home health   Diet: Diet Order             Diet heart healthy/carb modified Room service appropriate? Yes; Fluid consistency: Thin  Diet effective now                     Antimicrobial agents: Anti-infectives (From admission, onward)    Start     Dose/Rate Route Frequency Ordered Stop   10/30/21 1800  vancomycin (VANCOREADY) IVPB 1250 mg/250 mL  Status:  Discontinued        1,250 mg 166.7 mL/hr over 90 Minutes Intravenous Every 48 hours 10/29/21 0744 10/29/21 1340   10/29/21 2245  ceFAZolin (ANCEF) IVPB 2g/100 mL premix        2 g 200 mL/hr over 30 Minutes Intravenous Every 12 hours 10/29/21 2145     10/29/21 2100  ceFEPIme (MAXIPIME) 2 g in sodium chloride 0.9 % 100 mL IVPB  Status:  Discontinued        2 g 200 mL/hr over 30 Minutes Intravenous Every 24 hours 10/28/21 2202 10/29/21 1340  10/29/21 2000  penicillin G potassium 4 Million Units in dextrose 5 % 250 mL IVPB  Status:  Discontinued        4 Million Units 250 mL/hr over 60 Minutes Intravenous Every 8 hours 10/29/21 1401 10/29/21 2145   10/29/21 1500  penicillin G potassium 4 Million Units in dextrose 5 % 250 mL IVPB  Status:  Discontinued        4 Million Units 250 mL/hr over 60 Minutes Intravenous Every 8 hours 10/29/21 1340 10/29/21 1401   10/28/21 2201  vancomycin variable dose per unstable renal function (pharmacist dosing)  Status:  Discontinued         Does not apply See admin instructions 10/28/21 2202 10/29/21 0744   10/28/21 2130  vancomycin (VANCOCIN) IVPB 1000 mg/200 mL premix       See Hyperspace for full Linked Orders Report.   1,000 mg 200 mL/hr over 60 Minutes  Intravenous  Once 10/28/21 2015 10/29/21 0049   10/28/21 2030  vancomycin (VANCOCIN) IVPB 1000 mg/200 mL premix       See Hyperspace for full Linked Orders Report.   1,000 mg 200 mL/hr over 60 Minutes Intravenous  Once 10/28/21 2015 10/28/21 2323   10/28/21 2000  ceFEPIme (MAXIPIME) 2 g in sodium chloride 0.9 % 100 mL IVPB        2 g 200 mL/hr over 30 Minutes Intravenous  Once 10/28/21 1951 10/28/21 2152   10/28/21 2000  metroNIDAZOLE (FLAGYL) IVPB 500 mg        500 mg 100 mL/hr over 60 Minutes Intravenous  Once 10/28/21 1951 10/28/21 2224   10/28/21 2000  vancomycin (VANCOCIN) IVPB 1000 mg/200 mL premix  Status:  Discontinued        1,000 mg 200 mL/hr over 60 Minutes Intravenous  Once 10/28/21 1951 10/28/21 2015        MEDICATIONS: Scheduled Meds:  allopurinol  300 mg Oral q AM   apixaban  2.5 mg Oral BID   DULoxetine  30 mg Oral Daily   insulin aspart  0-20 Units Subcutaneous TID WC   insulin aspart  0-5 Units Subcutaneous QHS   insulin glargine-yfgn  25 Units Subcutaneous Daily   levothyroxine  88 mcg Oral q morning   metoprolol tartrate  12.5 mg Oral BID   pregabalin  50 mg Oral BID   Vitamin D (Ergocalciferol)  50,000 Units Oral Q7 days   Continuous Infusions:   ceFAZolin (ANCEF) IV 2 g (11/06/21 0932)   furosemide 100 mg (11/06/21 0511)   PRN Meds:.acetaminophen, ALPRAZolam, diphenhydrAMINE, docusate sodium, levalbuterol, mineral oil-hydrophilic petrolatum, mouth rinse, polyethylene glycol, traMADol   I have personally reviewed following labs and imaging studies  LABORATORY DATA: CBC: Recent Labs  Lab 11/02/21 0326 11/03/21 0938 11/04/21 0455 11/05/21 0437 11/06/21 0334  WBC 9.8 10.6* 10.4 10.6* 11.5*  HGB 9.9* 10.0* 10.2* 10.1* 9.6*  HCT 31.5* 31.1* 31.5* 32.0* 29.3*  MCV 89.7 89.1 87.5 88.2 87.5  PLT 123* 118* 119* 109* 110*     Basic Metabolic Panel: Recent Labs  Lab 11/02/21 0326 11/03/21 0938 11/04/21 0455 11/05/21 0437 11/06/21 0334  NA  135 133* 133* 131* 132*  K 3.6 3.9 3.7 4.0 3.9  CL 94* 93* 92* 90* 91*  CO2 '30 29 30 29 30  '$ GLUCOSE 172* 173* 188* 239* 159*  BUN 99* 95* 93* 93* 88*  CREATININE 2.61* 2.43* 2.40* 2.61* 2.62*  CALCIUM 9.6 9.5 9.7 9.5 9.7     GFR: Estimated Creatinine Clearance: 27.9  mL/min (A) (by C-G formula based on SCr of 2.62 mg/dL (H)).  Liver Function Tests: No results for input(s): "AST", "ALT", "ALKPHOS", "BILITOT", "PROT", "ALBUMIN" in the last 168 hours. No results for input(s): "LIPASE", "AMYLASE" in the last 168 hours. No results for input(s): "AMMONIA" in the last 168 hours.  Coagulation Profile: No results for input(s): "INR", "PROTIME" in the last 168 hours.  Cardiac Enzymes: No results for input(s): "CKTOTAL", "CKMB", "CKMBINDEX", "TROPONINI" in the last 168 hours.  BNP (last 3 results) No results for input(s): "PROBNP" in the last 8760 hours.  Lipid Profile: No results for input(s): "CHOL", "HDL", "LDLCALC", "TRIG", "CHOLHDL", "LDLDIRECT" in the last 72 hours.  Thyroid Function Tests: No results for input(s): "TSH", "T4TOTAL", "FREET4", "T3FREE", "THYROIDAB" in the last 72 hours.  Anemia Panel: No results for input(s): "VITAMINB12", "FOLATE", "FERRITIN", "TIBC", "IRON", "RETICCTPCT" in the last 72 hours.  Urine analysis:    Component Value Date/Time   COLORURINE YELLOW 10/28/2021 2140   APPEARANCEUR CLEAR 10/28/2021 2140   LABSPEC 1.020 10/28/2021 2140   PHURINE 5.0 10/28/2021 2140   GLUCOSEU NEGATIVE 10/28/2021 2140   HGBUR NEGATIVE 10/28/2021 2140   BILIRUBINUR NEGATIVE 10/28/2021 2140   KETONESUR NEGATIVE 10/28/2021 2140   PROTEINUR NEGATIVE 10/28/2021 2140   UROBILINOGEN 1.0 11/10/2013 2324   NITRITE NEGATIVE 10/28/2021 2140   LEUKOCYTESUR NEGATIVE 10/28/2021 2140    Sepsis Labs: Lactic Acid, Venous    Component Value Date/Time   LATICACIDVEN 1.6 10/29/2021 0439    MICROBIOLOGY: Recent Results (from the past 240 hour(s))  SARS Coronavirus 2 by RT PCR  (hospital order, performed in [facility] hospital lab) *cepheid single result test* Anterior Nasal Swab     Status: None   Collection Time: 10/28/21  6:50 PM   Specimen: Anterior Nasal Swab  Result Value Ref Range Status   SARS Coronavirus 2 by RT PCR NEGATIVE NEGATIVE Final    Comment: (NOTE) SARS-CoV-2 target nucleic acids are NOT DETECTED.  The SARS-CoV-2 RNA is generally detectable in upper and lower respiratory specimens during the acute phase of infection. The lowest concentration of SARS-CoV-2 viral copies this assay can detect is 250 copies / mL. A negative result does not preclude SARS-CoV-2 infection and should not be used as the sole basis for treatment or other patient management decisions.  A negative result may occur with improper specimen collection / handling, submission of specimen other than nasopharyngeal swab, presence of viral mutation(s) within the areas targeted by this assay, and inadequate number of viral copies (<250 copies / mL). A negative result must be combined with clinical observations, patient history, and epidemiological information.  Fact Sheet for Patients:   https://www.patel.info/  Fact Sheet for Healthcare Providers: https://hall.com/  This test is not yet approved or  cleared by the [place] FDA and has been authorized for detection and/or diagnosis of SARS-CoV-2 by FDA under an Emergency Use Authorization (EUA).  This EUA will remain in effect (meaning this test can be used) for the duration of the COVID-19 declaration under Section 564(b)(1) of the Act, 21 U.S.C. section 360bbb-3(b)(1), unless the authorization is terminated or revoked sooner.  Performed at [facility], [address] [place], [place] [address]   Culture, blood (Routine x 2)     Status: Abnormal   Collection Time: 10/28/21  7:20 PM   Specimen: Left Antecubital; Blood  Result Value Ref Range Status   Specimen  Description   Final    LEFT ANTECUBITAL BLOOD Performed at [facility] Lab, [place] [address], [place], [place] [address]    Special Requests   Final    BOTTLES DRAWN AEROBIC AND ANAEROBIC Blood Culture adequate volume Performed at [facility], [address] [place], [place] [address]    Culture  Setup Time   Final    GRAM POSITIVE COCCI IN BOTH AEROBIC AND ANAEROBIC BOTTLES CRITICAL RESULT CALLED TO, READ BACK BY AND VERIFIED WITH: PHARMD A. PAYTES [id number] '@1039'$  FH    Culture (A)  Final    GROUP B STREP(S.AGALACTIAE)ISOLATED SUSCEPTIBILITIES PERFORMED ON PREVIOUS CULTURE WITHIN THE LAST 5 DAYS. Performed at [facility], [place] [address], [place], [place] [address]    Report Status 10/31/2021 FINAL  Final  Culture, blood (Routine x 2)     Status: Abnormal   Collection Time: 10/28/21  7:25 PM   Specimen: BLOOD LEFT ARM  Result Value Ref Range Status   Specimen Description   Final    BLOOD LEFT ARM Performed at [facility], [address] [place], [place] [address]    Special Requests   Final    BOTTLES DRAWN AEROBIC ONLY Blood Culture results may not be optimal due to an inadequate volume of blood received in culture bottles Performed at [facility], [address] [place], [place] [address]    Culture  Setup Time   Final    GRAM POSITIVE COCCI IN CHAINS BOTTLES DRAWN AEROBIC ONLY CRITICAL RESULT CALLED TO, READ BACK BY AND VERIFIED WITH: PHARMD A. PAYTES [id number] '@1039'$  FH Performed at [place] [address] [place], [place] [address]    Culture GROUP B STREP(S.AGALACTIAE)ISOLATED (A)  Final   Report Status 10/31/2021 FINAL  Final   Organism ID, Bacteria GROUP B STREP(S.AGALACTIAE)ISOLATED  Final      Susceptibility   Group b strep(s.agalactiae)isolated - MIC*    CLINDAMYCIN >=1 RESISTANT Resistant     AMPICILLIN <=0.25 SENSITIVE Sensitive     ERYTHROMYCIN >=8 RESISTANT Resistant     VANCOMYCIN 0.5 SENSITIVE Sensitive      CEFTRIAXONE <=0.12 SENSITIVE Sensitive     LEVOFLOXACIN 0.5 SENSITIVE Sensitive     PENICILLIN Value in next row Sensitive      SENSITIVE0.06    * GROUP B STREP(S.AGALACTIAE)ISOLATED  Blood Culture ID Panel (Reflexed)     Status: Abnormal   Collection Time: 10/28/21  7:25 PM  Result Value Ref Range Status   Enterococcus faecalis NOT DETECTED NOT DETECTED Final   Enterococcus Faecium NOT DETECTED NOT DETECTED Final   Listeria monocytogenes NOT DETECTED NOT DETECTED Final   Staphylococcus species NOT DETECTED NOT DETECTED Final   Staphylococcus aureus (BCID) NOT DETECTED NOT DETECTED Final   Staphylococcus epidermidis NOT DETECTED NOT DETECTED Final   Staphylococcus lugdunensis NOT DETECTED NOT DETECTED Final   Streptococcus species DETECTED (A) NOT DETECTED Final    Comment: CRITICAL RESULT CALLED TO, READ BACK BY AND VERIFIED WITH: PHARMD A. PAYTES [id number] '@1039'$  FH    Streptococcus agalactiae DETECTED (A) NOT DETECTED Final    Comment: CRITICAL RESULT CALLED TO, READ BACK BY AND VERIFIED WITH: PHARMD A. PAYTES [id number] '@1039'$  FH    Streptococcus pneumoniae NOT DETECTED NOT DETECTED Final   Streptococcus pyogenes NOT DETECTED NOT DETECTED Final   A.calcoaceticus-baumannii NOT DETECTED NOT DETECTED Final   Bacteroides fragilis NOT DETECTED NOT DETECTED Final   Enterobacterales NOT DETECTED NOT DETECTED Final   Enterobacter cloacae complex NOT DETECTED NOT DETECTED Final  Escherichia coli NOT DETECTED NOT DETECTED Final   Klebsiella aerogenes NOT DETECTED NOT DETECTED Final   Klebsiella oxytoca NOT DETECTED NOT DETECTED Final   Klebsiella pneumoniae NOT DETECTED NOT DETECTED Final   Proteus species NOT DETECTED NOT DETECTED Final   Salmonella species NOT DETECTED NOT DETECTED Final   Serratia marcescens NOT DETECTED NOT DETECTED Final   Haemophilus influenzae NOT DETECTED NOT DETECTED Final   Neisseria meningitidis NOT DETECTED NOT DETECTED Final   Pseudomonas aeruginosa NOT  DETECTED NOT DETECTED Final   Stenotrophomonas maltophilia NOT DETECTED NOT DETECTED Final   Candida albicans NOT DETECTED NOT DETECTED Final   Candida auris NOT DETECTED NOT DETECTED Final   Candida glabrata NOT DETECTED NOT DETECTED Final   Candida krusei NOT DETECTED NOT DETECTED Final   Candida parapsilosis NOT DETECTED NOT DETECTED Final   Candida tropicalis NOT DETECTED NOT DETECTED Final   Cryptococcus neoformans/gattii NOT DETECTED NOT DETECTED Final    Comment: Performed at [facility], [place] [address], [place], [place] [address]  MRSA Next Gen by PCR, Nasal     Status: None   Collection Time: 10/29/21  1:06 AM   Specimen: Nasal Mucosa; Nasal Swab  Result Value Ref Range Status   MRSA by PCR Next Gen NOT DETECTED NOT DETECTED Final    Comment: (NOTE) The GeneXpert MRSA Assay (FDA approved for NASAL specimens only), is one component of a comprehensive MRSA colonization surveillance program. It is not intended to diagnose MRSA infection nor to guide or monitor treatment for MRSA infections. Test performance is not FDA approved in patients less than 74 years old. Performed at [facility], [place] [address] [place], [place] [address]   Culture, blood (Routine X 2) w Reflex to ID Panel     Status: None   Collection Time: 10/31/21 11:44 AM   Specimen: BLOOD  Result Value Ref Range Status   Specimen Description BLOOD BLOOD RIGHT HAND  Final   Special Requests   Final    BOTTLES DRAWN AEROBIC AND ANAEROBIC Blood Culture results may not be optimal due to an inadequate volume of blood received in culture bottles   Culture   Final    NO GROWTH 5 DAYS Performed at [facility], [place] [address] [place], [place] [address]    Report Status 11/05/2021 FINAL  Final  Culture, blood (Routine X 2) w Reflex to ID Panel     Status: None   Collection Time: 10/31/21 11:56 AM   Specimen: BLOOD  Result Value Ref Range Status   Specimen Description BLOOD BLOOD LEFT HAND  Final    Special Requests   Final    BOTTLES DRAWN AEROBIC ONLY Blood Culture adequate volume   Culture   Final    NO GROWTH 5 DAYS Performed at [facility], [place] [address], [place], [place] [address]    Report Status 11/05/2021 FINAL  Final  Aerobic/Anaerobic Culture w Gram Stain (surgical/deep wound)     Status: None   Collection Time: 10/31/21  5:15 PM   Specimen: Abscess  Result Value Ref Range Status   Specimen Description ABSCESS LEFT KNEE  Final   Special Requests NONE  Final   Gram Stain   Final    NO ORGANISMS SEEN MODERATE WBC PRESENT, PREDOMINANTLY PMN    Culture   Final    No growth aerobically or anaerobically. Performed at [facility], [place] [address], [place], [place] [address]  Report Status 11/05/2021 FINAL  Final    RADIOLOGY STUDIES/RESULTS: No results found.   LOS: 8 days   Oren Binet, MD  [facility]    To contact the attending provider between 7A-7P or the covering provider during after hours 7P-7A, please log into the web site [email or web address] and access using universal [place] password for that web site. If you do not have the password, please call the hospital operator.  11/06/2021, 12:12 PM

## 2021-11-06 NOTE — Telephone Encounter (Signed)
Called and was unable to leave patient a voice message to confirm/remind patient of their appointment at the [facility] on 11/07/21.

## 2021-11-07 ENCOUNTER — Encounter (HOSPITAL_COMMUNITY): Payer: Medicare HMO

## 2021-11-07 DIAGNOSIS — I4891 Unspecified atrial fibrillation: Secondary | ICD-10-CM | POA: Diagnosis not present

## 2021-11-07 DIAGNOSIS — A419 Sepsis, unspecified organism: Secondary | ICD-10-CM | POA: Diagnosis not present

## 2021-11-07 DIAGNOSIS — R6521 Severe sepsis with septic shock: Secondary | ICD-10-CM | POA: Diagnosis not present

## 2021-11-07 DIAGNOSIS — J9601 Acute respiratory failure with hypoxia: Secondary | ICD-10-CM | POA: Diagnosis not present

## 2021-11-07 DIAGNOSIS — I10 Essential (primary) hypertension: Secondary | ICD-10-CM | POA: Diagnosis not present

## 2021-11-07 LAB — CBC
HCT: 31.1 % — ABNORMAL LOW (ref 36.0–46.0)
Hemoglobin: 9.9 g/dL — ABNORMAL LOW (ref 12.0–15.0)
MCH: 28.5 pg (ref 26.0–34.0)
MCHC: 31.8 g/dL (ref 30.0–36.0)
MCV: 89.6 fL (ref 80.0–100.0)
Platelets: 108 10*3/uL — ABNORMAL LOW (ref 150–400)
RBC: 3.47 MIL/uL — ABNORMAL LOW (ref 3.87–5.11)
RDW: 16.6 % — ABNORMAL HIGH (ref 11.5–15.5)
WBC: 9.8 10*3/uL (ref 4.0–10.5)
nRBC: 0 % (ref 0.0–0.2)

## 2021-11-07 LAB — BASIC METABOLIC PANEL
Anion gap: 11 (ref 5–15)
BUN: 90 mg/dL — ABNORMAL HIGH (ref 8–23)
CO2: 29 mmol/L (ref 22–32)
Calcium: 9.5 mg/dL (ref 8.9–10.3)
Chloride: 91 mmol/L — ABNORMAL LOW (ref 98–111)
Creatinine, Ser: 2.71 mg/dL — ABNORMAL HIGH (ref 0.44–1.00)
GFR, Estimated: 18 mL/min — ABNORMAL LOW (ref >60)
Glucose, Bld: 132 mg/dL — ABNORMAL HIGH (ref 70–99)
Potassium: 3.9 mmol/L (ref 3.5–5.1)
Sodium: 131 mmol/L — ABNORMAL LOW (ref 135–145)

## 2021-11-07 LAB — GLUCOSE, CAPILLARY
Glucose-Capillary: 169 mg/dL — ABNORMAL HIGH (ref 70–99)
Glucose-Capillary: 193 mg/dL — ABNORMAL HIGH (ref 70–99)
Glucose-Capillary: 233 mg/dL — ABNORMAL HIGH (ref 70–99)
Glucose-Capillary: 215 mg/dL — ABNORMAL HIGH (ref 70–99)

## 2021-11-07 MED ORDER — FUROSEMIDE 40 MG PO TABS
80.0000 mg | ORAL_TABLET | Freq: Two times a day (BID) | ORAL | Status: DC
  Administered 2021-11-07 – 2021-11-09 (×4): 80 mg via ORAL
  Filled 2021-11-07 (×4): qty 2

## 2021-11-07 NOTE — Progress Notes (Signed)
PROGRESS NOTE        PATIENT DETAILS Name: Cleona Doubleday Age: 76 y.o. Sex: female Date of Birth: 1945/12/16 Admit Date: 10/28/2021 Admitting Physician Collier Bullock, MD MHD:[id number], Ravisankar, MD  Brief Summary: Patient is a 76 y.o.  female with a history of persistent atrial fibrillation, cardiac amyloidosis, chronic right-sided CHF, COPD, CKD stage IV, HTN, hypothyroidism, hyperlipidemia-presented with weakness/fatigue-patient was found to have sepsis due to streptococcal bacteremia.  See below for further details.  Significant events: 8/8>> admit to ICU-septic shock requiring vasopressors. 8/10>> transfer to [facility].  Significant studies: 8/8>> CT head: No acute intracranial abnormality 8/8>> CT chest/abdomen: Changes of cirrhosis, anasarca leg edema throughout abdominal wall. 8/10> MRI left knee: Diffuse subcutaneous soft tissue swelling-small fluid collection near lateral retinaculum. 8/11>> MRI left foot: No obvious acute osseous abnormality. 8/11>> MRI right foot: No obvious acute osseous abnormality. 8/13>> Doppler right upper extremity: DVT involving right axillary/right brachial vein.  Significant microbiology data: 8/8>> COVID PCR: Negative 8/8>> blood culture: Group B strep agalactiae. 8/11>> blood culture: No growth 8/11>> abscess left knee: No growth  Procedures: 8/11>> ultrasound-guided aspiration of small abscess in the superficial soft tissue of the lateral aspect of the left knee. 8/14>>TEE-no vegetation  Consults: ID, plastics, cardiology, orthopedics  Subjective: Lying comfortably in bed-no major events overnight.  Objective: Vitals: Blood pressure 98/83, pulse 91, temperature 97.9 F (36.6 C), temperature source Axillary, resp. rate 18, height '5\' 9"'$  (1.753 m), weight 134.9 kg, SpO2 100 %.   Exam: Gen Exam:Alert awake-not in any distress HEENT:atraumatic, normocephalic Chest: B/L clear to auscultation  anteriorly CVS:S1S2 regular Abdomen:soft non tender, non distended Extremities:+ edema Neurology: Non focal Skin: no rash    Pertinent Labs/Radiology:    Latest Ref Rng & Units 11/07/2021    4:40 AM 11/06/2021    3:34 AM 11/05/2021    4:37 AM  CBC  WBC 4.0 - 10.5 K/uL 9.8  11.5  10.6   Hemoglobin 12.0 - 15.0 g/dL 9.9  9.6  10.1   Hematocrit 36.0 - 46.0 % 31.1  29.3  32.0   Platelets 150 - 400 K/uL 108  110  109     Lab Results  Component Value Date   NA 131 (L) 11/07/2021   K 3.9 11/07/2021   CL 91 (L) 11/07/2021   CO2 29 11/07/2021      Assessment/Plan: Septic shock due to streptococcal agalactiae bacteremia: Sepsis physiology has resolved-initially on IV Ancef-now on oral cefadroxil until 8/29 as recommended by ID.   Acute metabolic encephalopathy: Due to sepsis-mentation has improved-back to baseline.  Acute on chronic right-sided heart failure/HFpEF exacerbation: Volume status improved-some mild edema remains mostly in the left lower extremity which is at baseline per patient-cardiology planning to transition to oral diuretics today and plans to monitor for 1 additional day.  Continue to follow weights/electrolytes/intake/output.    History of cardiac amyloidosis: Cardiology following-defer further to cardiology.  Persistent atrial fibrillation: Rate controlled with metoprolol-on Eliquis.  AKI on CKD stage IV: Renal function relatively stable-continue to closely watch while on  Lasix.    Bilateral lower extremity ulcers/history of left knee hematoma-requiring evacuation and drain placement in March 2023: MRI left knee-MRI of bilateral foot without any evidence of infection.  Fluid from soft tissue in left knee that was aspirated negative for infection as well.  HTN: BP stable-continue metoprolol.  DM-2: CBG  stable-continue Semglee 25 units daily and SSI.  Recent Labs    11/06/21 1555 11/06/21 2116 11/07/21 0808  GLUCAP 233* 179* 169*      Hypothyroidism:  Continue Synthroid  Gout: Continue allopurinol  GERD: Continue PPI  Peripheral neuropathy: Continue Lyrica-and as needed tramadol  Depression/anxiety: Stable-continue cymbalta  Pressure Ulcer: Pressure Injury 10/29/21 Knee Anterior;Left Stage 2 -  Partial thickness loss of dermis presenting as a shallow open injury with a red, pink wound bed without slough. (Active)  10/29/21 0100  Location: Knee  Location Orientation: Anterior;Left  Staging: Stage 2 -  Partial thickness loss of dermis presenting as a shallow open injury with a red, pink wound bed without slough.  Wound Description (Comments):   Present on Admission: Yes  Dressing Type Foam - Lift dressing to assess site every shift 11/07/21 1100     Pressure Injury 10/29/21 Other (Comment) Left Stage 2 -  Partial thickness loss of dermis presenting as a shallow open injury with a red, pink wound bed without slough. (Active)  10/29/21 0100  Location: Other (Comment)  Location Orientation: Left  Staging: Stage 2 -  Partial thickness loss of dermis presenting as a shallow open injury with a red, pink wound bed without slough.  Wound Description (Comments):   Present on Admission: Yes  Dressing Type Foam - Lift dressing to assess site every shift 11/07/21 1100     Pressure Injury 10/29/21 Toe (Comment  which one) Anterior;Right Stage 2 -  Partial thickness loss of dermis presenting as a shallow open injury with a red, pink wound bed without slough. (Active)  10/29/21 0100  Location: Toe (Comment  which one)  Location Orientation: Anterior;Right  Staging: Stage 2 -  Partial thickness loss of dermis presenting as a shallow open injury with a red, pink wound bed without slough.  Wound Description (Comments):   Present on Admission: Yes  Dressing Type Foam - Lift dressing to assess site every shift 11/07/21 1100    Morbid Obesity: Estimated body mass index is 43.93 kg/m as calculated from the following:   Height as of this  encounter: '5\' 9"'$  (1.753 m).   Weight as of this encounter: 134.9 kg.   Code status:   Code Status: Partial Code   DVT Prophylaxis: apixaban (ELIQUIS) tablet 2.5 mg Start: 11/04/21 1645 SCDs Start: 10/29/21 0114 apixaban (ELIQUIS) tablet 2.5 mg    Family Communication: Spouse at bedside   Disposition Plan: Status is: Inpatient Remains inpatient appropriate because: Improving volume status-being transitioned to oral diuretics.  Plan to monitor 1 additional day on oral diuretics before consideration of discharge.   Planned Discharge Destination:Home health hopefully on 8/19.   Diet: Diet Order             Diet heart healthy/carb modified Room service appropriate? Yes; Fluid consistency: Thin  Diet effective now                     Antimicrobial agents: Anti-infectives (From admission, onward)    Start     Dose/Rate Route Frequency Ordered Stop   11/06/21 2200  cefadroxil (DURICEF) capsule 500 mg        500 mg Oral 2 times daily 11/06/21 1226 11/19/21 0959   10/30/21 1800  vancomycin (VANCOREADY) IVPB 1250 mg/250 mL  Status:  Discontinued        1,250 mg 166.7 mL/hr over 90 Minutes Intravenous Every 48 hours 10/29/21 0744 10/29/21 1340   10/29/21 2245  ceFAZolin (ANCEF) IVPB 2g/100  mL premix  Status:  Discontinued        2 g 200 mL/hr over 30 Minutes Intravenous Every 12 hours 10/29/21 2145 11/06/21 1226   10/29/21 2100  ceFEPIme (MAXIPIME) 2 g in sodium chloride 0.9 % 100 mL IVPB  Status:  Discontinued        2 g 200 mL/hr over 30 Minutes Intravenous Every 24 hours 10/28/21 2202 10/29/21 1340   10/29/21 2000  penicillin G potassium 4 Million Units in dextrose 5 % 250 mL IVPB  Status:  Discontinued        4 Million Units 250 mL/hr over 60 Minutes Intravenous Every 8 hours 10/29/21 1401 10/29/21 2145   10/29/21 1500  penicillin G potassium 4 Million Units in dextrose 5 % 250 mL IVPB  Status:  Discontinued        4 Million Units 250 mL/hr over 60 Minutes Intravenous  Every 8 hours 10/29/21 1340 10/29/21 1401   10/28/21 2201  vancomycin variable dose per unstable renal function (pharmacist dosing)  Status:  Discontinued         Does not apply See admin instructions 10/28/21 2202 10/29/21 0744   10/28/21 2130  vancomycin (VANCOCIN) IVPB 1000 mg/200 mL premix       See Hyperspace for full Linked Orders Report.   1,000 mg 200 mL/hr over 60 Minutes Intravenous  Once 10/28/21 2015 10/29/21 0049   10/28/21 2030  vancomycin (VANCOCIN) IVPB 1000 mg/200 mL premix       See Hyperspace for full Linked Orders Report.   1,000 mg 200 mL/hr over 60 Minutes Intravenous  Once 10/28/21 2015 10/28/21 2323   10/28/21 2000  ceFEPIme (MAXIPIME) 2 g in sodium chloride 0.9 % 100 mL IVPB        2 g 200 mL/hr over 30 Minutes Intravenous  Once 10/28/21 1951 10/28/21 2152   10/28/21 2000  metroNIDAZOLE (FLAGYL) IVPB 500 mg        500 mg 100 mL/hr over 60 Minutes Intravenous  Once 10/28/21 1951 10/28/21 2224   10/28/21 2000  vancomycin (VANCOCIN) IVPB 1000 mg/200 mL premix  Status:  Discontinued        1,000 mg 200 mL/hr over 60 Minutes Intravenous  Once 10/28/21 1951 10/28/21 2015        MEDICATIONS: Scheduled Meds:  allopurinol  300 mg Oral q AM   apixaban  2.5 mg Oral BID   cefadroxil  500 mg Oral BID   DULoxetine  30 mg Oral Daily   furosemide  80 mg Oral BID   insulin aspart  0-20 Units Subcutaneous TID WC   insulin aspart  0-5 Units Subcutaneous QHS   insulin glargine-yfgn  25 Units Subcutaneous Daily   levothyroxine  88 mcg Oral q morning   metoprolol tartrate  12.5 mg Oral BID   pregabalin  50 mg Oral BID   Vitamin D (Ergocalciferol)  50,000 Units Oral Q7 days   Continuous Infusions:   PRN Meds:.acetaminophen, ALPRAZolam, diphenhydrAMINE, docusate sodium, levalbuterol, mineral oil-hydrophilic petrolatum, mouth rinse, polyethylene glycol, traMADol   I have personally reviewed following labs and imaging studies  LABORATORY DATA: CBC: Recent Labs  Lab  11/03/21 0938 11/04/21 0455 11/05/21 0437 11/06/21 0334 11/07/21 0440  WBC 10.6* 10.4 10.6* 11.5* 9.8  HGB 10.0* 10.2* 10.1* 9.6* 9.9*  HCT 31.1* 31.5* 32.0* 29.3* 31.1*  MCV 89.1 87.5 88.2 87.5 89.6  PLT 118* 119* 109* 110* 108*     Basic Metabolic Panel: Recent Labs  Lab 11/03/21 [phone]  11/04/21 0455 11/05/21 0437 11/06/21 0334 11/07/21 0440  NA 133* 133* 131* 132* 131*  K 3.9 3.7 4.0 3.9 3.9  CL 93* 92* 90* 91* 91*  CO2 '29 30 29 30 29  '$ GLUCOSE 173* 188* 239* 159* 132*  BUN 95* 93* 93* 88* 90*  CREATININE 2.43* 2.40* 2.61* 2.62* 2.71*  CALCIUM 9.5 9.7 9.5 9.7 9.5     GFR: Estimated Creatinine Clearance: 26.9 mL/min (A) (by C-G formula based on SCr of 2.71 mg/dL (H)).  Liver Function Tests: No results for input(s): "AST", "ALT", "ALKPHOS", "BILITOT", "PROT", "ALBUMIN" in the last 168 hours. No results for input(s): "LIPASE", "AMYLASE" in the last 168 hours. No results for input(s): "AMMONIA" in the last 168 hours.  Coagulation Profile: No results for input(s): "INR", "PROTIME" in the last 168 hours.  Cardiac Enzymes: No results for input(s): "CKTOTAL", "CKMB", "CKMBINDEX", "TROPONINI" in the last 168 hours.  BNP (last 3 results) No results for input(s): "PROBNP" in the last 8760 hours.  Lipid Profile: No results for input(s): "CHOL", "HDL", "LDLCALC", "TRIG", "CHOLHDL", "LDLDIRECT" in the last 72 hours.  Thyroid Function Tests: No results for input(s): "TSH", "T4TOTAL", "FREET4", "T3FREE", "THYROIDAB" in the last 72 hours.  Anemia Panel: No results for input(s): "VITAMINB12", "FOLATE", "FERRITIN", "TIBC", "IRON", "RETICCTPCT" in the last 72 hours.  Urine analysis:    Component Value Date/Time   COLORURINE YELLOW 10/28/2021 2140   APPEARANCEUR CLEAR 10/28/2021 2140   LABSPEC 1.020 10/28/2021 2140   PHURINE 5.0 10/28/2021 2140   GLUCOSEU NEGATIVE 10/28/2021 2140   HGBUR NEGATIVE 10/28/2021 2140   BILIRUBINUR NEGATIVE 10/28/2021 2140   KETONESUR  NEGATIVE 10/28/2021 2140   PROTEINUR NEGATIVE 10/28/2021 2140   UROBILINOGEN 1.0 11/10/2013 2324   NITRITE NEGATIVE 10/28/2021 2140   LEUKOCYTESUR NEGATIVE 10/28/2021 2140    Sepsis Labs: Lactic Acid, Venous    Component Value Date/Time   LATICACIDVEN 1.6 10/29/2021 0439    MICROBIOLOGY: Recent Results (from the past 240 hour(s))  SARS Coronavirus 2 by RT PCR (hospital order, performed in [facility] hospital lab) *cepheid single result test* Anterior Nasal Swab     Status: None   Collection Time: 10/28/21  6:50 PM   Specimen: Anterior Nasal Swab  Result Value Ref Range Status   SARS Coronavirus 2 by RT PCR NEGATIVE NEGATIVE Final    Comment: (NOTE) SARS-CoV-2 target nucleic acids are NOT DETECTED.  The SARS-CoV-2 RNA is generally detectable in upper and lower respiratory specimens during the acute phase of infection. The lowest concentration of SARS-CoV-2 viral copies this assay can detect is 250 copies / mL. A negative result does not preclude SARS-CoV-2 infection and should not be used as the sole basis for treatment or other patient management decisions.  A negative result may occur with improper specimen collection / handling, submission of specimen other than nasopharyngeal swab, presence of viral mutation(s) within the areas targeted by this assay, and inadequate number of viral copies (<250 copies / mL). A negative result must be combined with clinical observations, patient history, and epidemiological information.  Fact Sheet for Patients:   https://www.patel.info/  Fact Sheet for Healthcare Providers: https://hall.com/  This test is not yet approved or  cleared by the [place] FDA and has been authorized for detection and/or diagnosis of SARS-CoV-2 by FDA under an Emergency Use Authorization (EUA).  This EUA will remain in effect (meaning this test can be used) for the duration of the COVID-19 declaration under  Section 564(b)(1) of the Act, 21 U.S.C. section 360bbb-3(b)(1),  unless the authorization is terminated or revoked sooner.  Performed at [facility], [address] [place], [place] [address]   Culture, blood (Routine x 2)     Status: Abnormal   Collection Time: 10/28/21  7:20 PM   Specimen: Left Antecubital; Blood  Result Value Ref Range Status   Specimen Description   Final    LEFT ANTECUBITAL BLOOD Performed at [facility], [place] [address], [place], [place] [address]    Special Requests   Final    BOTTLES DRAWN AEROBIC AND ANAEROBIC Blood Culture adequate volume Performed at [facility], [address] [place], [place] [address]    Culture  Setup Time   Final    GRAM POSITIVE COCCI IN BOTH AEROBIC AND ANAEROBIC BOTTLES CRITICAL RESULT CALLED TO, READ BACK BY AND VERIFIED WITH: PHARMD A. PAYTES [id number] '@1039'$  FH    Culture (A)  Final    GROUP B STREP(S.AGALACTIAE)ISOLATED SUSCEPTIBILITIES PERFORMED ON PREVIOUS CULTURE WITHIN THE LAST 5 DAYS. Performed at [facility], [place] [address], [place], [place] [address]    Report Status 10/31/2021 FINAL  Final  Culture, blood (Routine x 2)     Status: Abnormal   Collection Time: 10/28/21  7:25 PM   Specimen: BLOOD LEFT ARM  Result Value Ref Range Status   Specimen Description   Final    BLOOD LEFT ARM Performed at [facility], [address] [place], [place] [address]    Special Requests   Final    BOTTLES DRAWN AEROBIC ONLY Blood Culture results may not be optimal due to an inadequate volume of blood received in culture bottles Performed at [facility], [address] [place], [place] [address]    Culture  Setup Time   Final    GRAM POSITIVE COCCI IN CHAINS BOTTLES DRAWN AEROBIC ONLY CRITICAL RESULT CALLED TO, READ BACK BY AND VERIFIED WITH: PHARMD A. PAYTES [id number] '@1039'$  FH Performed at [place] [address], [place], [place] [address]    Culture GROUP  B STREP(S.AGALACTIAE)ISOLATED (A)  Final   Report Status 10/31/2021 FINAL  Final   Organism ID, Bacteria GROUP B STREP(S.AGALACTIAE)ISOLATED  Final      Susceptibility   Group b strep(s.agalactiae)isolated - MIC*    CLINDAMYCIN >=1 RESISTANT Resistant     AMPICILLIN <=0.25 SENSITIVE Sensitive     ERYTHROMYCIN >=8 RESISTANT Resistant     VANCOMYCIN 0.5 SENSITIVE Sensitive     CEFTRIAXONE <=0.12 SENSITIVE Sensitive     LEVOFLOXACIN 0.5 SENSITIVE Sensitive     PENICILLIN Value in next row Sensitive      SENSITIVE0.06    * GROUP B STREP(S.AGALACTIAE)ISOLATED  Blood Culture ID Panel (Reflexed)     Status: Abnormal   Collection Time: 10/28/21  7:25 PM  Result Value Ref Range Status   Enterococcus faecalis NOT DETECTED NOT DETECTED Final   Enterococcus Faecium NOT DETECTED NOT DETECTED Final   Listeria monocytogenes NOT DETECTED NOT DETECTED Final   Staphylococcus species NOT DETECTED NOT DETECTED Final   Staphylococcus aureus (BCID) NOT DETECTED NOT DETECTED Final   Staphylococcus epidermidis NOT DETECTED NOT DETECTED Final   Staphylococcus lugdunensis NOT DETECTED NOT DETECTED Final   Streptococcus species DETECTED (A) NOT DETECTED Final    Comment: CRITICAL RESULT CALLED TO, READ BACK BY AND VERIFIED WITH: PHARMD A. PAYTES [id number] '@1039'$  FH    Streptococcus agalactiae DETECTED (A) NOT DETECTED Final  Comment: CRITICAL RESULT CALLED TO, READ BACK BY AND VERIFIED WITH: PHARMD A. PAYTES [id number] '@1039'$  FH    Streptococcus pneumoniae NOT DETECTED NOT DETECTED Final   Streptococcus pyogenes NOT DETECTED NOT DETECTED Final   A.calcoaceticus-baumannii NOT DETECTED NOT DETECTED Final   Bacteroides fragilis NOT DETECTED NOT DETECTED Final   Enterobacterales NOT DETECTED NOT DETECTED Final   Enterobacter cloacae complex NOT DETECTED NOT DETECTED Final   Escherichia coli NOT DETECTED NOT DETECTED Final   Klebsiella aerogenes NOT DETECTED NOT DETECTED Final   Klebsiella oxytoca NOT DETECTED NOT  DETECTED Final   Klebsiella pneumoniae NOT DETECTED NOT DETECTED Final   Proteus species NOT DETECTED NOT DETECTED Final   Salmonella species NOT DETECTED NOT DETECTED Final   Serratia marcescens NOT DETECTED NOT DETECTED Final   Haemophilus influenzae NOT DETECTED NOT DETECTED Final   Neisseria meningitidis NOT DETECTED NOT DETECTED Final   Pseudomonas aeruginosa NOT DETECTED NOT DETECTED Final   Stenotrophomonas maltophilia NOT DETECTED NOT DETECTED Final   Candida albicans NOT DETECTED NOT DETECTED Final   Candida auris NOT DETECTED NOT DETECTED Final   Candida glabrata NOT DETECTED NOT DETECTED Final   Candida krusei NOT DETECTED NOT DETECTED Final   Candida parapsilosis NOT DETECTED NOT DETECTED Final   Candida tropicalis NOT DETECTED NOT DETECTED Final   Cryptococcus neoformans/gattii NOT DETECTED NOT DETECTED Final    Comment: Performed at [facility], [address], [place], [place] [address]  MRSA Next Gen by PCR, Nasal     Status: None   Collection Time: 10/29/21  1:06 AM   Specimen: Nasal Mucosa; Nasal Swab  Result Value Ref Range Status   MRSA by PCR Next Gen NOT DETECTED NOT DETECTED Final    Comment: (NOTE) The GeneXpert MRSA Assay (FDA approved for NASAL specimens only), is one component of a comprehensive MRSA colonization surveillance program. It is not intended to diagnose MRSA infection nor to guide or monitor treatment for MRSA infections. Test performance is not FDA approved in patients less than 60 years old. Performed at [facility], [place] [address] [place], [place] [address]   Culture, blood (Routine X 2) w Reflex to ID Panel     Status: None   Collection Time: 10/31/21 11:44 AM   Specimen: BLOOD  Result Value Ref Range Status   Specimen Description BLOOD BLOOD RIGHT HAND  Final   Special Requests   Final    BOTTLES DRAWN AEROBIC AND ANAEROBIC Blood Culture results may not be optimal due to an inadequate volume of blood received in culture  bottles   Culture   Final    NO GROWTH 5 DAYS Performed at [facility], [place] [address] [place], [place] [address]    Report Status 11/05/2021 FINAL  Final  Culture, blood (Routine X 2) w Reflex to ID Panel     Status: None   Collection Time: 10/31/21 11:56 AM   Specimen: BLOOD  Result Value Ref Range Status   Specimen Description BLOOD BLOOD LEFT HAND  Final   Special Requests   Final    BOTTLES DRAWN AEROBIC ONLY Blood Culture adequate volume   Culture   Final    NO GROWTH 5 DAYS Performed at [facility], [place] [address], [place], [place] [address]    Report Status 11/05/2021 FINAL  Final  Aerobic/Anaerobic Culture w Gram Stain (surgical/deep wound)     Status: None   Collection Time: 10/31/21  5:15 PM  Specimen: Abscess  Result Value Ref Range Status   Specimen Description ABSCESS LEFT KNEE  Final   Special Requests NONE  Final   Gram Stain   Final    NO ORGANISMS SEEN MODERATE WBC PRESENT, PREDOMINANTLY PMN    Culture   Final    No growth aerobically or anaerobically. Performed at [facility], [place] [address] [place], [place] [address]    Report Status 11/05/2021 FINAL  Final    RADIOLOGY STUDIES/RESULTS: No results found.   LOS: 9 days   Oren Binet, MD  [facility]    To contact the attending provider between 7A-7P or the covering provider during after hours 7P-7A, please log into the web site [email or web address] and access using universal [place] password for that web site. If you do not have the password, please call the hospital operator.  11/07/2021, 12:02 PM

## 2021-11-07 NOTE — Progress Notes (Signed)
Progress Note  Patient Name: Kristine [name] Date of Encounter: 11/07/2021  Primary Cardiologist:   Glori Bickers, MD   Subjective   Breathing OK.  Ambulated with PT yesterday a little in the room.  Denies chest pain.   Inpatient Medications    Scheduled Meds:  allopurinol  300 mg Oral q AM   apixaban  2.5 mg Oral BID   cefadroxil  500 mg Oral BID   DULoxetine  30 mg Oral Daily   insulin aspart  0-20 Units Subcutaneous TID WC   insulin aspart  0-5 Units Subcutaneous QHS   insulin glargine-yfgn  25 Units Subcutaneous Daily   levothyroxine  88 mcg Oral q morning   metoprolol tartrate  12.5 mg Oral BID   pregabalin  50 mg Oral BID   Vitamin D (Ergocalciferol)  50,000 Units Oral Q7 days   Continuous Infusions:  furosemide 100 mg (11/07/21 0538)   PRN Meds: acetaminophen, ALPRAZolam, diphenhydrAMINE, docusate sodium, levalbuterol, mineral oil-hydrophilic petrolatum, mouth rinse, polyethylene glycol, traMADol   Vital Signs    Vitals:   11/06/21 1200 11/06/21 2119 11/07/21 0432 11/07/21 0800  BP: 118/74 1'12/73 99/65 98/83 '$  Pulse: 88 82 80 91  Resp: '16 16 18   '$ Temp: 97.8 F (36.6 C) (!) 97.5 F (36.4 C) 97.9 F (36.6 C)   TempSrc: Oral Oral Axillary   SpO2: 95% 91% 98% 100%  Weight:      Height:        Intake/Output Summary (Last 24 hours) at 11/07/2021 0913 Last data filed at 11/07/2021 0500 Gross per 24 hour  Intake --  Output 650 ml  Net -650 ml   Filed Weights   11/03/21 0417 11/05/21 0421 11/06/21 0500  Weight: 135 kg 135.8 kg 134.9 kg    Telemetry    Atrial fib with controlled ventricular rate, PVCs - Personally Reviewed  ECG    NA - Personally Reviewed  Physical Exam   GEN: No acute distress.   Neck: No  JVD Cardiac: Irreuglar RR, soft systolic murmurs, rubs, or gallops. Distant heart sounds Respiratory: Clear  to auscultation bilaterally. GI: Soft, nontender, non-distended  MS:   Mild to moderate leg edema; No deformity. Neuro:   Nonfocal  Psych: Normal affect   Labs    Chemistry Recent Labs  Lab 11/05/21 0437 11/06/21 0334 11/07/21 0440  NA 131* 132* 131*  K 4.0 3.9 3.9  CL 90* 91* 91*  CO2 '29 30 29  '$ GLUCOSE 239* 159* 132*  BUN 93* 88* 90*  CREATININE 2.61* 2.62* 2.71*  CALCIUM 9.5 9.7 9.5  GFRNONAA 19* 19* 18*  ANIONGAP '12 11 11     '$ Hematology Recent Labs  Lab 11/05/21 0437 11/06/21 0334 11/07/21 0440  WBC 10.6* 11.5* 9.8  RBC 3.63* 3.35* 3.47*  HGB 10.1* 9.6* 9.9*  HCT 32.0* 29.3* 31.1*  MCV 88.2 87.5 89.6  MCH 27.8 28.7 28.5  MCHC 31.6 32.8 31.8  RDW 16.4* 16.6* 16.6*  PLT 109* 110* 108*    Cardiac EnzymesNo results for input(s): "TROPONINI" in the last 168 hours. No results for input(s): "TROPIPOC" in the last 168 hours.   BNPNo results for input(s): "BNP", "PROBNP" in the last 168 hours.   DDimer No results for input(s): "DDIMER" in the last 168 hours.   Radiology    No results found.  Cardiac Studies   TEE  LEFT VENTRICLE: EF = 55%. No regional wall motion abnormalities.  RIGHT VENTRICLE: Markedly dilated. Severely HK   LEFT ATRIUM:  Mildly dilated   LEFT ATRIAL APPENDAGE: No thrombus.    RIGHT ATRIUM: Massively dilated   AORTIC VALVE:  Trileaflet.  Mild calcification No AI/AS. No vegetation.    MITRAL VALVE:    Normal. Mild posterior MR. No vegetation.    TRICUSPID VALVE: Normal. Severe TR. No vegetation.    PULMONIC VALVE: Normal. Trivial PR. No vegetation.    INTERATRIAL SEPTUM: No PFO or ASD. Bulging R to L   PERICARDIUM: Moderate effusion around RA. No tamponade   DESCENDING AORTA: Mild plaque    Patient Profile     76 y.o. female with a hx of persistent atrial fibrillation, CHF, CKD, DM, hypertension, hypothyroidism, hyperlipidemia, sleep apnea who is being seen for the evaluation of CHF   Assessment & Plan    Acute on chronic right sided CHF :   See TEE above.    Finally net negative after mobilizing much fluid from given during the management  of her sepsis.   Creat is creeping up.  I would like to see how she does with Lasix PO 80 bid.    Chronic atrial fibrillation :  Rate controlled.  Continue current therapy.     RUE DVT :  On Eliquis  For questions or updates, please contact [place] Please consult [email or web address] for contact info under Cardiology/STEMI.   Signed, Minus Breeding, MD  11/07/2021, 9:13 AM

## 2021-11-08 DIAGNOSIS — J9601 Acute respiratory failure with hypoxia: Secondary | ICD-10-CM | POA: Diagnosis not present

## 2021-11-08 DIAGNOSIS — A419 Sepsis, unspecified organism: Secondary | ICD-10-CM | POA: Diagnosis not present

## 2021-11-08 DIAGNOSIS — I272 Pulmonary hypertension, unspecified: Secondary | ICD-10-CM | POA: Diagnosis not present

## 2021-11-08 DIAGNOSIS — I4891 Unspecified atrial fibrillation: Secondary | ICD-10-CM | POA: Diagnosis not present

## 2021-11-08 DIAGNOSIS — I10 Essential (primary) hypertension: Secondary | ICD-10-CM | POA: Diagnosis not present

## 2021-11-08 LAB — GLUCOSE, CAPILLARY
Glucose-Capillary: 158 mg/dL — ABNORMAL HIGH (ref 70–99)
Glucose-Capillary: 264 mg/dL — ABNORMAL HIGH (ref 70–99)
Glucose-Capillary: 258 mg/dL — ABNORMAL HIGH (ref 70–99)
Glucose-Capillary: 185 mg/dL — ABNORMAL HIGH (ref 70–99)

## 2021-11-08 LAB — BASIC METABOLIC PANEL
Anion gap: 11 (ref 5–15)
BUN: 87 mg/dL — ABNORMAL HIGH (ref 8–23)
CO2: 31 mmol/L (ref 22–32)
Calcium: 10.1 mg/dL (ref 8.9–10.3)
Chloride: 90 mmol/L — ABNORMAL LOW (ref 98–111)
Creatinine, Ser: 2.84 mg/dL — ABNORMAL HIGH (ref 0.44–1.00)
GFR, Estimated: 17 mL/min — ABNORMAL LOW (ref >60)
Glucose, Bld: 177 mg/dL — ABNORMAL HIGH (ref 70–99)
Potassium: 4.5 mmol/L (ref 3.5–5.1)
Sodium: 132 mmol/L — ABNORMAL LOW (ref 135–145)

## 2021-11-08 MED ORDER — APIXABAN 5 MG PO TABS
5.0000 mg | ORAL_TABLET | Freq: Two times a day (BID) | ORAL | Status: DC
  Administered 2021-11-08 – 2021-11-10 (×4): 5 mg via ORAL
  Filled 2021-11-08 (×4): qty 1

## 2021-11-08 NOTE — Progress Notes (Signed)
PROGRESS NOTE        PATIENT DETAILS Name: Kristine [name] Age: 76 y.o. Sex: female Date of Birth: [birth year] Admit Date: 10/28/2021 Admitting Physician Collier Bullock, MD EEF:EOFH, Ravisankar, MD  Brief Summary: Patient is a 76 y.o.  female with a history of persistent atrial fibrillation, cardiac amyloidosis, chronic right-sided CHF, COPD, CKD stage IV, HTN, hypothyroidism, hyperlipidemia-presented with weakness/fatigue-patient was found to have sepsis due to streptococcal bacteremia.  See below for further details.  Significant events: 8/8>> admit to ICU-septic shock requiring vasopressors. 8/10>> transfer to [facility].  Significant studies: 8/8>> CT head: No acute intracranial abnormality 8/8>> CT chest/abdomen: Changes of cirrhosis, anasarca leg edema throughout abdominal wall. 8/10> MRI left knee: Diffuse subcutaneous soft tissue swelling-small fluid collection near lateral retinaculum. 8/11>> MRI left foot: No obvious acute osseous abnormality. 8/11>> MRI right foot: No obvious acute osseous abnormality. 8/13>> Doppler right upper extremity: DVT involving right axillary/right brachial vein.  Significant microbiology data: 8/8>> COVID PCR: Negative 8/8>> blood culture: Group B strep agalactiae. 8/11>> blood culture: No growth 8/11>> abscess left knee: No growth  Procedures: 8/11>> ultrasound-guided aspiration of small abscess in the superficial soft tissue of the lateral aspect of the left knee. 8/14>>TEE-no vegetation  Consults: ID, plastics, cardiology, orthopedics  Subjective: Lying comfortably in bed-no major issues overnight.  Objective: Vitals: Blood pressure 113/76, pulse 94, temperature 97.9 F (36.6 C), temperature source Oral, resp. rate 16, height '5\' 9"'$  (1.753 m), weight 134.9 kg, SpO2 94 %.   Exam: Gen Exam:Alert awake-not in any distress HEENT:atraumatic, normocephalic Chest: B/L clear to auscultation anteriorly CVS:S1S2  regular Abdomen:soft non tender, non distended Extremities:+ Edema-left> right Neurology: Non focal Skin: no rash    Pertinent Labs/Radiology:    Latest Ref Rng & Units 11/07/2021    4:40 AM 11/06/2021    3:34 AM 11/05/2021    4:37 AM  CBC  WBC 4.0 - 10.5 K/uL 9.8  11.5  10.6   Hemoglobin 12.0 - 15.0 g/dL 9.9  9.6  10.1   Hematocrit 36.0 - 46.0 % 31.1  29.3  32.0   Platelets 150 - 400 K/uL 108  110  109     Lab Results  Component Value Date   NA 132 (L) 11/08/2021   K 4.5 11/08/2021   CL 90 (L) 11/08/2021   CO2 31 11/08/2021      Assessment/Plan: Septic shock due to streptococcal agalactiae bacteremia: Sepsis physiology has resolved-initially on IV Ancef-now on oral cefadroxil until 8/29 as recommended by ID.   Acute metabolic encephalopathy: Due to sepsis-mentation has improved-back to baseline.  Acute on chronic right-sided heart failure/HFpEF exacerbation: Volume status has improved-transition to oral Lasix on 8/18-creatinine continues to slowly increase.  Awaiting input from cardiology today.    History of cardiac amyloidosis: Cardiology following-defer further to cardiology.  Persistent atrial fibrillation: Rate controlled with metoprolol-on Eliquis.  AKI on CKD stage IV: Creatinine slowly worsening-switch to oral furosemide yesterday-we will continue to follow closely.  Bilateral lower extremity ulcers/history of left knee hematoma-requiring evacuation and drain placement in March 2023: MRI left knee-MRI of bilateral foot without any evidence of infection.  Fluid from soft tissue in left knee that was aspirated negative for infection as well.  HTN: BP stable-continue metoprolol.  DM-2: CBG stable-continue Semglee 25 units daily and SSI.  Recent Labs    11/07/21 1547 11/07/21 2151 11/08/21 2197  GLUCAP 233* 215* 158*     Hypothyroidism: Continue Synthroid  Gout: Continue allopurinol  GERD: Continue PPI  Peripheral neuropathy: Continue Lyrica-and as  needed tramadol  Depression/anxiety: Stable-continue cymbalta  Pressure Ulcer: Pressure Injury 10/29/21 Knee Anterior;Left Stage 2 -  Partial thickness loss of dermis presenting as a shallow open injury with a red, pink wound bed without slough. (Active)  10/29/21 0100  Location: Knee  Location Orientation: Anterior;Left  Staging: Stage 2 -  Partial thickness loss of dermis presenting as a shallow open injury with a red, pink wound bed without slough.  Wound Description (Comments):   Present on Admission: Yes  Dressing Type Foam - Lift dressing to assess site every shift 11/07/21 1100     Pressure Injury 10/29/21 Other (Comment) Left Stage 2 -  Partial thickness loss of dermis presenting as a shallow open injury with a red, pink wound bed without slough. (Active)  10/29/21 0100  Location: Other (Comment)  Location Orientation: Left  Staging: Stage 2 -  Partial thickness loss of dermis presenting as a shallow open injury with a red, pink wound bed without slough.  Wound Description (Comments):   Present on Admission: Yes  Dressing Type Foam - Lift dressing to assess site every shift 11/07/21 1100     Pressure Injury 10/29/21 Toe (Comment  which one) Anterior;Right Stage 2 -  Partial thickness loss of dermis presenting as a shallow open injury with a red, pink wound bed without slough. (Active)  10/29/21 0100  Location: Toe (Comment  which one)  Location Orientation: Anterior;Right  Staging: Stage 2 -  Partial thickness loss of dermis presenting as a shallow open injury with a red, pink wound bed without slough.  Wound Description (Comments):   Present on Admission: Yes  Dressing Type Foam - Lift dressing to assess site every shift 11/07/21 1100    Morbid Obesity: Estimated body mass index is 43.93 kg/m as calculated from the following:   Height as of this encounter: '5\' 9"'$  (1.753 m).   Weight as of this encounter: 134.9 kg.   Code status:   Code Status: Partial Code   DVT  Prophylaxis: apixaban (ELIQUIS) tablet 2.5 mg Start: 11/04/21 1645 SCDs Start: 10/29/21 0114 apixaban (ELIQUIS) tablet 2.5 mg    Family Communication: Spouse at bedside   Disposition Plan: Status is: Inpatient Remains inpatient appropriate because: Improving volume status-being transitioned to oral diuretics.  Plan to monitor 1 additional day on oral diuretics before consideration of discharge.   Planned Discharge Destination:Home health hopefully on 8/19.   Diet: Diet Order             Diet heart healthy/carb modified Room service appropriate? Yes; Fluid consistency: Thin  Diet effective now                     Antimicrobial agents: Anti-infectives (From admission, onward)    Start     Dose/Rate Route Frequency Ordered Stop   11/06/21 2200  cefadroxil (DURICEF) capsule 500 mg        500 mg Oral 2 times daily 11/06/21 1226 11/19/21 0959   10/30/21 1800  vancomycin (VANCOREADY) IVPB 1250 mg/250 mL  Status:  Discontinued        1,250 mg 166.7 mL/hr over 90 Minutes Intravenous Every 48 hours 10/29/21 0744 10/29/21 1340   10/29/21 2245  ceFAZolin (ANCEF) IVPB 2g/100 mL premix  Status:  Discontinued        2 g 200 mL/hr over 30 Minutes Intravenous  Every 12 hours 10/29/21 2145 11/06/21 1226   10/29/21 2100  ceFEPIme (MAXIPIME) 2 g in sodium chloride 0.9 % 100 mL IVPB  Status:  Discontinued        2 g 200 mL/hr over 30 Minutes Intravenous Every 24 hours 10/28/21 2202 10/29/21 1340   10/29/21 2000  penicillin G potassium 4 Million Units in dextrose 5 % 250 mL IVPB  Status:  Discontinued        4 Million Units 250 mL/hr over 60 Minutes Intravenous Every 8 hours 10/29/21 1401 10/29/21 2145   10/29/21 1500  penicillin G potassium 4 Million Units in dextrose 5 % 250 mL IVPB  Status:  Discontinued        4 Million Units 250 mL/hr over 60 Minutes Intravenous Every 8 hours 10/29/21 1340 10/29/21 1401   10/28/21 2201  vancomycin variable dose per unstable renal function (pharmacist  dosing)  Status:  Discontinued         Does not apply See admin instructions 10/28/21 2202 10/29/21 0744   10/28/21 2130  vancomycin (VANCOCIN) IVPB 1000 mg/200 mL premix       See Hyperspace for full Linked Orders Report.   1,000 mg 200 mL/hr over 60 Minutes Intravenous  Once 10/28/21 2015 10/29/21 0049   10/28/21 2030  vancomycin (VANCOCIN) IVPB 1000 mg/200 mL premix       See Hyperspace for full Linked Orders Report.   1,000 mg 200 mL/hr over 60 Minutes Intravenous  Once 10/28/21 2015 10/28/21 2323   10/28/21 2000  ceFEPIme (MAXIPIME) 2 g in sodium chloride 0.9 % 100 mL IVPB        2 g 200 mL/hr over 30 Minutes Intravenous  Once 10/28/21 1951 10/28/21 2152   10/28/21 2000  metroNIDAZOLE (FLAGYL) IVPB 500 mg        500 mg 100 mL/hr over 60 Minutes Intravenous  Once 10/28/21 1951 10/28/21 2224   10/28/21 2000  vancomycin (VANCOCIN) IVPB 1000 mg/200 mL premix  Status:  Discontinued        1,000 mg 200 mL/hr over 60 Minutes Intravenous  Once 10/28/21 1951 10/28/21 2015        MEDICATIONS: Scheduled Meds:  allopurinol  300 mg Oral q AM   apixaban  2.5 mg Oral BID   cefadroxil  500 mg Oral BID   DULoxetine  30 mg Oral Daily   furosemide  80 mg Oral BID   insulin aspart  0-20 Units Subcutaneous TID WC   insulin aspart  0-5 Units Subcutaneous QHS   insulin glargine-yfgn  25 Units Subcutaneous Daily   levothyroxine  88 mcg Oral q morning   metoprolol tartrate  12.5 mg Oral BID   pregabalin  50 mg Oral BID   Vitamin D (Ergocalciferol)  50,000 Units Oral Q7 days   Continuous Infusions:   PRN Meds:.acetaminophen, ALPRAZolam, diphenhydrAMINE, docusate sodium, levalbuterol, mineral oil-hydrophilic petrolatum, mouth rinse, polyethylene glycol, traMADol   I have personally reviewed following labs and imaging studies  LABORATORY DATA: CBC: Recent Labs  Lab 11/03/21 0938 11/04/21 0455 11/05/21 0437 11/06/21 0334 11/07/21 0440  WBC 10.6* 10.4 10.6* 11.5* 9.8  HGB 10.0* 10.2*  10.1* 9.6* 9.9*  HCT 31.1* 31.5* 32.0* 29.3* 31.1*  MCV 89.1 87.5 88.2 87.5 89.6  PLT 118* 119* 109* 110* 108*     Basic Metabolic Panel: Recent Labs  Lab 11/04/21 0455 11/05/21 0437 11/06/21 0334 11/07/21 0440 11/08/21 0221  NA 133* 131* 132* 131* 132*  K 3.7 4.0 3.9 3.9  4.5  CL 92* 90* 91* 91* 90*  CO2 '30 29 30 29 31  '$ GLUCOSE 188* 239* 159* 132* 177*  BUN 93* 93* 88* 90* 87*  CREATININE 2.40* 2.61* 2.62* 2.71* 2.84*  CALCIUM 9.7 9.5 9.7 9.5 10.1     GFR: Estimated Creatinine Clearance: 25.7 mL/min (A) (by C-G formula based on SCr of 2.84 mg/dL (H)).  Liver Function Tests: No results for input(s): "AST", "ALT", "ALKPHOS", "BILITOT", "PROT", "ALBUMIN" in the last 168 hours. No results for input(s): "LIPASE", "AMYLASE" in the last 168 hours. No results for input(s): "AMMONIA" in the last 168 hours.  Coagulation Profile: No results for input(s): "INR", "PROTIME" in the last 168 hours.  Cardiac Enzymes: No results for input(s): "CKTOTAL", "CKMB", "CKMBINDEX", "TROPONINI" in the last 168 hours.  BNP (last 3 results) No results for input(s): "PROBNP" in the last 8760 hours.  Lipid Profile: No results for input(s): "CHOL", "HDL", "LDLCALC", "TRIG", "CHOLHDL", "LDLDIRECT" in the last 72 hours.  Thyroid Function Tests: No results for input(s): "TSH", "T4TOTAL", "FREET4", "T3FREE", "THYROIDAB" in the last 72 hours.  Anemia Panel: No results for input(s): "VITAMINB12", "FOLATE", "FERRITIN", "TIBC", "IRON", "RETICCTPCT" in the last 72 hours.  Urine analysis:    Component Value Date/Time   COLORURINE YELLOW 10/28/2021 2140   APPEARANCEUR CLEAR 10/28/2021 2140   LABSPEC 1.020 10/28/2021 2140   PHURINE 5.0 10/28/2021 2140   GLUCOSEU NEGATIVE 10/28/2021 2140   HGBUR NEGATIVE 10/28/2021 2140   BILIRUBINUR NEGATIVE 10/28/2021 2140   KETONESUR NEGATIVE 10/28/2021 2140   PROTEINUR NEGATIVE 10/28/2021 2140   UROBILINOGEN 1.0 11/10/2013 2324   NITRITE NEGATIVE 10/28/2021  2140   LEUKOCYTESUR NEGATIVE 10/28/2021 2140    Sepsis Labs: Lactic Acid, Venous    Component Value Date/Time   LATICACIDVEN 1.6 10/29/2021 0439    MICROBIOLOGY: Recent Results (from the past 240 hour(s))  Culture, blood (Routine X 2) w Reflex to ID Panel     Status: None   Collection Time: 10/31/21 11:44 AM   Specimen: BLOOD  Result Value Ref Range Status   Specimen Description BLOOD BLOOD RIGHT HAND  Final   Special Requests   Final    BOTTLES DRAWN AEROBIC AND ANAEROBIC Blood Culture results may not be optimal due to an inadequate volume of blood received in culture bottles   Culture   Final    NO GROWTH 5 DAYS Performed at [facility], [place] [address], [place], [place] [address]    Report Status 11/05/2021 FINAL  Final  Culture, blood (Routine X 2) w Reflex to ID Panel     Status: None   Collection Time: 10/31/21 11:56 AM   Specimen: BLOOD  Result Value Ref Range Status   Specimen Description BLOOD BLOOD LEFT HAND  Final   Special Requests   Final    BOTTLES DRAWN AEROBIC ONLY Blood Culture adequate volume   Culture   Final    NO GROWTH 5 DAYS Performed at [facility], [place] [address] [place], [place] [address]    Report Status 11/05/2021 FINAL  Final  Aerobic/Anaerobic Culture w Gram Stain (surgical/deep wound)     Status: None   Collection Time: 10/31/21  5:15 PM   Specimen: Abscess  Result Value Ref Range Status   Specimen Description ABSCESS LEFT KNEE  Final   Special Requests NONE  Final   Gram Stain   Final    NO ORGANISMS SEEN MODERATE WBC PRESENT, PREDOMINANTLY PMN    Culture   Final  No growth aerobically or anaerobically. Performed at [facility], [place] [address] [place], [place] [address]    Report Status 11/05/2021 FINAL  Final    RADIOLOGY STUDIES/RESULTS: No results found.   LOS: 10 days   Oren Binet, MD  [facility]    To contact the attending provider between 7A-7P or the covering provider during  after hours 7P-7A, please log into the web site [email or web address] and access using universal [place] password for that web site. If you do not have the password, please call the hospital operator.  11/08/2021, 10:53 AM

## 2021-11-08 NOTE — Progress Notes (Signed)
Progress Note  Patient Name: Kristine [name] Date of Encounter: 11/08/2021  [facility] Cardiologist: Glori Bickers, MD   Subjective   No complaints  Inpatient Medications    Scheduled Meds:  allopurinol  300 mg Oral q AM   apixaban  2.5 mg Oral BID   cefadroxil  500 mg Oral BID   DULoxetine  30 mg Oral Daily   furosemide  80 mg Oral BID   insulin aspart  0-20 Units Subcutaneous TID WC   insulin aspart  0-5 Units Subcutaneous QHS   insulin glargine-yfgn  25 Units Subcutaneous Daily   levothyroxine  88 mcg Oral q morning   metoprolol tartrate  12.5 mg Oral BID   pregabalin  50 mg Oral BID   Vitamin D (Ergocalciferol)  50,000 Units Oral Q7 days   Continuous Infusions:  PRN Meds: acetaminophen, ALPRAZolam, diphenhydrAMINE, docusate sodium, levalbuterol, mineral oil-hydrophilic petrolatum, mouth rinse, polyethylene glycol, traMADol   Vital Signs    Vitals:   11/07/21 2000 11/08/21 0000 11/08/21 0400 11/08/21 0800  BP: (!) 126/101 (!) 103/58 119/74 113/76  Pulse: 97 96 93 94  Resp: '17 13 16   '$ Temp: (!) 97.4 F (36.3 C) 97.6 F (36.4 C) 97.9 F (36.6 C) 97.9 F (36.6 C)  TempSrc: Axillary Axillary Axillary Oral  SpO2:   95% 94%  Weight:      Height:       No intake or output data in the 24 hours ending 11/08/21 1114    11/06/2021    5:00 AM 11/05/2021    4:21 AM 11/03/2021    4:17 AM  Last 3 Weights  Weight (lbs) 297 lb 8 oz 299 lb 6.2 oz 297 lb 9.9 oz  Weight (kg) 134.945 kg 135.8 kg 135 kg      Telemetry    Rate controlled afib - Personally Reviewed  ECG    N/a - Personally Reviewed  Physical Exam   GEN: No acute distress.   Neck: +JVD Cardiac: irreg Respiratory: mild crackles bases GI: Soft, nontender, non-distended  MS: trace bilateral LE edema Neuro:  Nonfocal  Psych: Normal affect   Labs    High Sensitivity Troponin:   Recent Labs  Lab 10/28/21 1945 10/28/21 2139  TROPONINIHS 53* 84*     Chemistry Recent Labs  Lab  11/06/21 0334 11/07/21 0440 11/08/21 0221  NA 132* 131* 132*  K 3.9 3.9 4.5  CL 91* 91* 90*  CO2 '30 29 31  '$ GLUCOSE 159* 132* 177*  BUN 88* 90* 87*  CREATININE 2.62* 2.71* 2.84*  CALCIUM 9.7 9.5 10.1  GFRNONAA 19* 18* 17*  ANIONGAP '11 11 11    '$ Lipids No results for input(s): "CHOL", "TRIG", "HDL", "LABVLDL", "LDLCALC", "CHOLHDL" in the last 168 hours.  Hematology Recent Labs  Lab 11/05/21 0437 11/06/21 0334 11/07/21 0440  WBC 10.6* 11.5* 9.8  RBC 3.63* 3.35* 3.47*  HGB 10.1* 9.6* 9.9*  HCT 32.0* 29.3* 31.1*  MCV 88.2 87.5 89.6  MCH 27.8 28.7 28.5  MCHC 31.6 32.8 31.8  RDW 16.4* 16.6* 16.6*  PLT 109* 110* 108*   Thyroid No results for input(s): "TSH", "FREET4" in the last 168 hours.  BNPNo results for input(s): "BNP", "PROBNP" in the last 168 hours.  DDimer No results for input(s): "DDIMER" in the last 168 hours.   Radiology    No results found.  Cardiac Studies     Patient Profile     76 y.o. female with a hx of persistent atrial fibrillation,  CHF, CKD, DM, hypertension, hypothyroidism, hyperlipidemia, sleep apnea who is being seen for the evaluation of CHF   Assessment & Plan    1.Acute on chronic right sided HF - followed in HF clinic - 08/2021 echo LVEF 55-60%, no WMAs, D shaped septum, severe RV dysfunction, severe pulm HTN, mild to mod TTR - 10/2021 TEE (result in 11/03/21 note Dr Phillip Heal) LVEF 55%, severe RV dysfunction, severe TR, no endocarditis - - Per review of advanced heart failure notes, given patient's history of atrial fibrillation, low voltage QRS complexes, CKD, and polyneuropathy there was concern for possible amyloid - Underwent PYP scan on 8/8 with equivocal study per my review; unlikely amyloid is the primary driver of heart failure.  - suspect left sided dysfunction, OSA/OHS playing role.  - Unfortunately not candidate for SGLT2i given frequent groin yeast infections  - I/Os are incomplete, admit weight 301 today 297 lbs.  -she is on oral  lasix '80mg'$  bid transitioned from IV yesterday, uptrending Cr. Still with some signs of fluid overload, would tolerate higher Cr at this time and continue oral lasix.   2. Afib - rate control with lopressor, eliquis for stroke prevention - based on age, weight, kidney function appears should be on '5mg'$  bid of eliquis. Discuss with primary team if any other factors involved in her dosing.   3. Upper extremity DVT - on eliquis. I believe should be '10mg'$  bid x 7 days then '5mg'$  bid regardless of renal function, check with primary team regarding dosing.   4. Bacteremia/Septic shock - abx per primary team - TEE without evidence of endocarditis  AKI on CKD - baseline Cr 2.2-2.5 - Cr today 2.8  For questions or updates, please contact [place] Please consult [email or web address] for contact info under        Signed, Carlyle Dolly, MD  11/08/2021, 11:14 AM

## 2021-11-09 DIAGNOSIS — A419 Sepsis, unspecified organism: Secondary | ICD-10-CM | POA: Diagnosis not present

## 2021-11-09 DIAGNOSIS — J9601 Acute respiratory failure with hypoxia: Secondary | ICD-10-CM | POA: Diagnosis not present

## 2021-11-09 DIAGNOSIS — I4891 Unspecified atrial fibrillation: Secondary | ICD-10-CM | POA: Diagnosis not present

## 2021-11-09 DIAGNOSIS — I10 Essential (primary) hypertension: Secondary | ICD-10-CM | POA: Diagnosis not present

## 2021-11-09 DIAGNOSIS — I272 Pulmonary hypertension, unspecified: Secondary | ICD-10-CM | POA: Diagnosis not present

## 2021-11-09 LAB — BASIC METABOLIC PANEL
Anion gap: 14 (ref 5–15)
BUN: 85 mg/dL — ABNORMAL HIGH (ref 8–23)
CO2: 29 mmol/L (ref 22–32)
Calcium: 10.1 mg/dL (ref 8.9–10.3)
Chloride: 91 mmol/L — ABNORMAL LOW (ref 98–111)
Creatinine, Ser: 3.02 mg/dL — ABNORMAL HIGH (ref 0.44–1.00)
GFR, Estimated: 16 mL/min — ABNORMAL LOW (ref >60)
Glucose, Bld: 131 mg/dL — ABNORMAL HIGH (ref 70–99)
Potassium: 3.8 mmol/L (ref 3.5–5.1)
Sodium: 134 mmol/L — ABNORMAL LOW (ref 135–145)

## 2021-11-09 LAB — GLUCOSE, CAPILLARY
Glucose-Capillary: 142 mg/dL — ABNORMAL HIGH (ref 70–99)
Glucose-Capillary: 170 mg/dL — ABNORMAL HIGH (ref 70–99)
Glucose-Capillary: 165 mg/dL — ABNORMAL HIGH (ref 70–99)
Glucose-Capillary: 220 mg/dL — ABNORMAL HIGH (ref 70–99)

## 2021-11-09 MED ORDER — PREGABALIN 25 MG PO CAPS
50.0000 mg | ORAL_CAPSULE | Freq: Every day | ORAL | Status: DC
  Administered 2021-11-10: 50 mg via ORAL
  Filled 2021-11-09: qty 2

## 2021-11-09 NOTE — Progress Notes (Signed)
PROGRESS NOTE        PATIENT DETAILS Name: Kristine [name] Age: 76 y.o. Sex: female Date of Birth: November 12, 1945 Admit Date: 10/28/2021 Admitting Physician Collier Bullock, MD PQZ:RAQT, Ravisankar, MD  Brief Summary: Patient is a 76 y.o.  female with a history of persistent atrial fibrillation, cardiac amyloidosis, chronic right-sided CHF, COPD, CKD stage IV, HTN, hypothyroidism, hyperlipidemia-presented with weakness/fatigue-patient was found to have sepsis due to streptococcal bacteremia.  See below for further details.  Significant events: 8/8>> admit to ICU-septic shock requiring vasopressors. 8/10>> transfer to [facility].  Significant studies: 8/8>> CT head: No acute intracranial abnormality 8/8>> CT chest/abdomen: Changes of cirrhosis, anasarca leg edema throughout abdominal wall. 8/10> MRI left knee: Diffuse subcutaneous soft tissue swelling-small fluid collection near lateral retinaculum. 8/11>> MRI left foot: No obvious acute osseous abnormality. 8/11>> MRI right foot: No obvious acute osseous abnormality. 8/13>> Doppler right upper extremity: DVT involving right axillary/right brachial vein.  Significant microbiology data: 8/8>> COVID PCR: Negative 8/8>> blood culture: Group B strep agalactiae. 8/11>> blood culture: No growth 8/11>> abscess left knee: No growth  Procedures: 8/11>> ultrasound-guided aspiration of small abscess in the superficial soft tissue of the lateral aspect of the left knee. 8/14>>TEE-no vegetation  Consults: ID, plastics, cardiology, orthopedics  Subjective: Lying comfortably in bed-no major issues overnight.  Continues to have some minimal swelling in the left leg more than the right leg.  Objective: Vitals: Blood pressure (!) 99/58, pulse 89, temperature (!) 97.4 F (36.3 C), temperature source Axillary, resp. rate 16, height '5\' 9"'$  (1.753 m), weight 134.9 kg, SpO2 94 %.   Exam: Gen Exam:Alert awake-not in any  distress HEENT:atraumatic, normocephalic Chest: B/L clear to auscultation anteriorly CVS:S1S2 regular Abdomen:soft non tender, non distended Extremities: Left> right leg edema. Neurology: Non focal Skin: no rash    Pertinent Labs/Radiology:    Latest Ref Rng & Units 11/07/2021    4:40 AM 11/06/2021    3:34 AM 11/05/2021    4:37 AM  CBC  WBC 4.0 - 10.5 K/uL 9.8  11.5  10.6   Hemoglobin 12.0 - 15.0 g/dL 9.9  9.6  10.1   Hematocrit 36.0 - 46.0 % 31.1  29.3  32.0   Platelets 150 - 400 K/uL 108  110  109     Lab Results  Component Value Date   NA 134 (L) 11/09/2021   K 3.8 11/09/2021   CL 91 (L) 11/09/2021   CO2 29 11/09/2021      Assessment/Plan: Septic shock due to streptococcal agalactiae bacteremia: Sepsis physiology has resolved-initially on IV Ancef-now on oral cefadroxil until 8/29 as recommended by ID.   Acute metabolic encephalopathy: Due to sepsis-mentation has improved-back to baseline.  Acute on chronic right-sided heart failure/HFpEF exacerbation: Volume status has stabilized-has been transitioned to oral Lasix on 8/18-significant bump in creatinine yesterday-awaiting lab work today.  Awaiting further recommendations from cardiology as well.    History of cardiac amyloidosis: Cardiology following-defer further to cardiology.  Persistent atrial fibrillation: Rate controlled with metoprolol-on Eliquis.  AKI on CKD stage IV: Significant bump in creatinine yesterday-awaiting lab work today.  Bilateral lower extremity ulcers/history of left knee hematoma-requiring evacuation and drain placement in March 2023: MRI left knee-MRI of bilateral foot without any evidence of infection.  Fluid from soft tissue in left knee that was aspirated negative for infection as well.  No evidence of  abscess (ruled out)  HTN: BP stable-continue metoprolol.  DM-2: CBG stable-continue Semglee 25 units daily and SSI.  Recent Labs    11/08/21 1646 11/08/21 2106 11/09/21 0832  GLUCAP  258* 185* 142*     Hypothyroidism: Continue Synthroid  Gout: Continue allopurinol  GERD: Continue PPI  Peripheral neuropathy: Continue Lyrica-and as needed tramadol  Depression/anxiety: Stable-continue cymbalta  Pressure Ulcer: Pressure Injury 10/29/21 Knee Anterior;Left Stage 2 -  Partial thickness loss of dermis presenting as a shallow open injury with a red, pink wound bed without slough. (Active)  10/29/21 0100  Location: Knee  Location Orientation: Anterior;Left  Staging: Stage 2 -  Partial thickness loss of dermis presenting as a shallow open injury with a red, pink wound bed without slough.  Wound Description (Comments):   Present on Admission: Yes  Dressing Type Foam - Lift dressing to assess site every shift 11/08/21 1632     Pressure Injury 10/29/21 Other (Comment) Left Stage 2 -  Partial thickness loss of dermis presenting as a shallow open injury with a red, pink wound bed without slough. (Active)  10/29/21 0100  Location: Other (Comment)  Location Orientation: Left  Staging: Stage 2 -  Partial thickness loss of dermis presenting as a shallow open injury with a red, pink wound bed without slough.  Wound Description (Comments):   Present on Admission: Yes  Dressing Type Foam - Lift dressing to assess site every shift 11/08/21 1632     Pressure Injury 10/29/21 Toe (Comment  which one) Anterior;Right Stage 2 -  Partial thickness loss of dermis presenting as a shallow open injury with a red, pink wound bed without slough. (Active)  10/29/21 0100  Location: Toe (Comment  which one)  Location Orientation: Anterior;Right  Staging: Stage 2 -  Partial thickness loss of dermis presenting as a shallow open injury with a red, pink wound bed without slough.  Wound Description (Comments):   Present on Admission: Yes  Dressing Type None 11/08/21 1632    Morbid Obesity: Estimated body mass index is 43.93 kg/m as calculated from the following:   Height as of this encounter: 5'  9" (1.753 m).   Weight as of this encounter: 134.9 kg.   Code status:   Code Status: Partial Code   DVT Prophylaxis: SCDs Start: 10/29/21 0114 apixaban (ELIQUIS) tablet 5 mg    Family Communication: Spouse at bedside   Disposition Plan: Status is: Inpatient Remains inpatient appropriate because: Improving volume status but with worsening AKI-awaiting lab work and further recommendations from cardiology.   Planned Discharge Destination:Home health hopefully on 8/20.   Diet: Diet Order             Diet heart healthy/carb modified Room service appropriate? Yes; Fluid consistency: Thin  Diet effective now                     Antimicrobial agents: Anti-infectives (From admission, onward)    Start     Dose/Rate Route Frequency Ordered Stop   11/06/21 2200  cefadroxil (DURICEF) capsule 500 mg        500 mg Oral 2 times daily 11/06/21 1226 11/19/21 0959   10/30/21 1800  vancomycin (VANCOREADY) IVPB 1250 mg/250 mL  Status:  Discontinued        1,250 mg 166.7 mL/hr over 90 Minutes Intravenous Every 48 hours 10/29/21 0744 10/29/21 1340   10/29/21 2245  ceFAZolin (ANCEF) IVPB 2g/100 mL premix  Status:  Discontinued  2 g 200 mL/hr over 30 Minutes Intravenous Every 12 hours 10/29/21 2145 11/06/21 1226   10/29/21 2100  ceFEPIme (MAXIPIME) 2 g in sodium chloride 0.9 % 100 mL IVPB  Status:  Discontinued        2 g 200 mL/hr over 30 Minutes Intravenous Every 24 hours 10/28/21 2202 10/29/21 1340   10/29/21 2000  penicillin G potassium 4 Million Units in dextrose 5 % 250 mL IVPB  Status:  Discontinued        4 Million Units 250 mL/hr over 60 Minutes Intravenous Every 8 hours 10/29/21 1401 10/29/21 2145   10/29/21 1500  penicillin G potassium 4 Million Units in dextrose 5 % 250 mL IVPB  Status:  Discontinued        4 Million Units 250 mL/hr over 60 Minutes Intravenous Every 8 hours 10/29/21 1340 10/29/21 1401   10/28/21 2201  vancomycin variable dose per unstable renal  function (pharmacist dosing)  Status:  Discontinued         Does not apply See admin instructions 10/28/21 2202 10/29/21 0744   10/28/21 2130  vancomycin (VANCOCIN) IVPB 1000 mg/200 mL premix       See Hyperspace for full Linked Orders Report.   1,000 mg 200 mL/hr over 60 Minutes Intravenous  Once 10/28/21 2015 10/29/21 0049   10/28/21 2030  vancomycin (VANCOCIN) IVPB 1000 mg/200 mL premix       See Hyperspace for full Linked Orders Report.   1,000 mg 200 mL/hr over 60 Minutes Intravenous  Once 10/28/21 2015 10/28/21 2323   10/28/21 2000  ceFEPIme (MAXIPIME) 2 g in sodium chloride 0.9 % 100 mL IVPB        2 g 200 mL/hr over 30 Minutes Intravenous  Once 10/28/21 1951 10/28/21 2152   10/28/21 2000  metroNIDAZOLE (FLAGYL) IVPB 500 mg        500 mg 100 mL/hr over 60 Minutes Intravenous  Once 10/28/21 1951 10/28/21 2224   10/28/21 2000  vancomycin (VANCOCIN) IVPB 1000 mg/200 mL premix  Status:  Discontinued        1,000 mg 200 mL/hr over 60 Minutes Intravenous  Once 10/28/21 1951 10/28/21 2015        MEDICATIONS: Scheduled Meds:  allopurinol  300 mg Oral q AM   apixaban  5 mg Oral BID   cefadroxil  500 mg Oral BID   DULoxetine  30 mg Oral Daily   insulin aspart  0-20 Units Subcutaneous TID WC   insulin aspart  0-5 Units Subcutaneous QHS   insulin glargine-yfgn  25 Units Subcutaneous Daily   levothyroxine  88 mcg Oral q morning   metoprolol tartrate  12.5 mg Oral BID   pregabalin  50 mg Oral BID   Vitamin D (Ergocalciferol)  50,000 Units Oral Q7 days   Continuous Infusions:   PRN Meds:.acetaminophen, ALPRAZolam, diphenhydrAMINE, docusate sodium, levalbuterol, mineral oil-hydrophilic petrolatum, mouth rinse, polyethylene glycol, traMADol   I have personally reviewed following labs and imaging studies  LABORATORY DATA: CBC: Recent Labs  Lab 11/03/21 0938 11/04/21 0455 11/05/21 0437 11/06/21 0334 11/07/21 0440  WBC 10.6* 10.4 10.6* 11.5* 9.8  HGB 10.0* 10.2* 10.1* 9.6*  9.9*  HCT 31.1* 31.5* 32.0* 29.3* 31.1*  MCV 89.1 87.5 88.2 87.5 89.6  PLT 118* 119* 109* 110* 108*     Basic Metabolic Panel: Recent Labs  Lab 11/05/21 0437 11/06/21 0334 11/07/21 0440 11/08/21 0221 11/09/21 0921  NA 131* 132* 131* 132* 134*  K 4.0 3.9 3.9 4.5  3.8  CL 90* 91* 91* 90* 91*  CO2 '29 30 29 31 29  '$ GLUCOSE 239* 159* 132* 177* 131*  BUN 93* 88* 90* 87* 85*  CREATININE 2.61* 2.62* 2.71* 2.84* 3.02*  CALCIUM 9.5 9.7 9.5 10.1 10.1     GFR: Estimated Creatinine Clearance: 24.2 mL/min (A) (by C-G formula based on SCr of 3.02 mg/dL (H)).  Liver Function Tests: No results for input(s): "AST", "ALT", "ALKPHOS", "BILITOT", "PROT", "ALBUMIN" in the last 168 hours. No results for input(s): "LIPASE", "AMYLASE" in the last 168 hours. No results for input(s): "AMMONIA" in the last 168 hours.  Coagulation Profile: No results for input(s): "INR", "PROTIME" in the last 168 hours.  Cardiac Enzymes: No results for input(s): "CKTOTAL", "CKMB", "CKMBINDEX", "TROPONINI" in the last 168 hours.  BNP (last 3 results) No results for input(s): "PROBNP" in the last 8760 hours.  Lipid Profile: No results for input(s): "CHOL", "HDL", "LDLCALC", "TRIG", "CHOLHDL", "LDLDIRECT" in the last 72 hours.  Thyroid Function Tests: No results for input(s): "TSH", "T4TOTAL", "FREET4", "T3FREE", "THYROIDAB" in the last 72 hours.  Anemia Panel: No results for input(s): "VITAMINB12", "FOLATE", "FERRITIN", "TIBC", "IRON", "RETICCTPCT" in the last 72 hours.  Urine analysis:    Component Value Date/Time   COLORURINE YELLOW 10/28/2021 2140   APPEARANCEUR CLEAR 10/28/2021 2140   LABSPEC 1.020 10/28/2021 2140   PHURINE 5.0 10/28/2021 2140   GLUCOSEU NEGATIVE 10/28/2021 2140   HGBUR NEGATIVE 10/28/2021 2140   BILIRUBINUR NEGATIVE 10/28/2021 2140   KETONESUR NEGATIVE 10/28/2021 2140   PROTEINUR NEGATIVE 10/28/2021 2140   UROBILINOGEN 1.0 11/10/2013 2324   NITRITE NEGATIVE 10/28/2021 2140    LEUKOCYTESUR NEGATIVE 10/28/2021 2140    Sepsis Labs: Lactic Acid, Venous    Component Value Date/Time   LATICACIDVEN 1.6 10/29/2021 0439    MICROBIOLOGY: Recent Results (from the past 240 hour(s))  Culture, blood (Routine X 2) w Reflex to ID Panel     Status: None   Collection Time: 10/31/21 11:44 AM   Specimen: BLOOD  Result Value Ref Range Status   Specimen Description BLOOD BLOOD RIGHT HAND  Final   Special Requests   Final    BOTTLES DRAWN AEROBIC AND ANAEROBIC Blood Culture results may not be optimal due to an inadequate volume of blood received in culture bottles   Culture   Final    NO GROWTH 5 DAYS Performed at [facility], [place] [address], [place], [place] [address]    Report Status 11/05/2021 FINAL  Final  Culture, blood (Routine X 2) w Reflex to ID Panel     Status: None   Collection Time: 10/31/21 11:56 AM   Specimen: BLOOD  Result Value Ref Range Status   Specimen Description BLOOD BLOOD LEFT HAND  Final   Special Requests   Final    BOTTLES DRAWN AEROBIC ONLY Blood Culture adequate volume   Culture   Final    NO GROWTH 5 DAYS Performed at [facility], [place] [address], [place], [place] [address]    Report Status 11/05/2021 FINAL  Final  Aerobic/Anaerobic Culture w Gram Stain (surgical/deep wound)     Status: None   Collection Time: 10/31/21  5:15 PM   Specimen: Abscess  Result Value Ref Range Status   Specimen Description ABSCESS LEFT KNEE  Final   Special Requests NONE  Final   Gram Stain   Final    NO ORGANISMS SEEN MODERATE WBC PRESENT, PREDOMINANTLY PMN    Culture   Final  No growth aerobically or anaerobically. Performed at [facility], [place] [address] [place], [place] [address]    Report Status 11/05/2021 FINAL  Final    RADIOLOGY STUDIES/RESULTS: No results found.   LOS: 11 days   Oren Binet, MD  [facility]    To contact the attending provider between 7A-7P or the covering provider during after hours  7P-7A, please log into the web site [email or web address] and access using universal [place] password for that web site. If you do not have the password, please call the hospital operator.  11/09/2021, 11:14 AM

## 2021-11-09 NOTE — Progress Notes (Signed)
Progress Note  Patient Name: Kristine [name] Date of Encounter: 11/09/2021  [facility] Cardiologist: Glori Bickers, MD   Subjective   No complaints  Inpatient Medications    Scheduled Meds:  allopurinol  300 mg Oral q AM   apixaban  5 mg Oral BID   cefadroxil  500 mg Oral BID   DULoxetine  30 mg Oral Daily   furosemide  80 mg Oral BID   insulin aspart  0-20 Units Subcutaneous TID WC   insulin aspart  0-5 Units Subcutaneous QHS   insulin glargine-yfgn  25 Units Subcutaneous Daily   levothyroxine  88 mcg Oral q morning   metoprolol tartrate  12.5 mg Oral BID   pregabalin  50 mg Oral BID   Vitamin D (Ergocalciferol)  50,000 Units Oral Q7 days   Continuous Infusions:  PRN Meds: acetaminophen, ALPRAZolam, diphenhydrAMINE, docusate sodium, levalbuterol, mineral oil-hydrophilic petrolatum, mouth rinse, polyethylene glycol, traMADol   Vital Signs    Vitals:   11/08/21 1632 11/08/21 1943 11/08/21 2333 11/09/21 0800  BP: 112/81 116/67 107/65 (!) 99/58  Pulse: 96 98 94 87  Resp: '13 18 13 15  '$ Temp: 98 F (36.7 C) 97.9 F (36.6 C) 97.8 F (36.6 C) (!) 97.4 F (36.3 C)  TempSrc: Oral Oral Oral Axillary  SpO2: 94% 94% 92% 94%  Weight:      Height:        Intake/Output Summary (Last 24 hours) at 11/09/2021 1051 Last data filed at 11/08/2021 1700 Gross per 24 hour  Intake --  Output 500 ml  Net -500 ml      11/06/2021    5:00 AM 11/05/2021    4:21 AM 11/03/2021    4:17 AM  Last 3 Weights  Weight (lbs) 297 lb 8 oz 299 lb 6.2 oz 297 lb 9.9 oz  Weight (kg) 134.945 kg 135.8 kg 135 kg      Telemetry    Rate controlled afib - Personally Reviewed  ECG    N/a - Personally Reviewed  Physical Exam   GEN: No acute distress.   Neck: No JVD Cardiac: irreg  Respiratory: Clear to auscultation bilaterally. GI: Soft, nontender, non-distended  MS: No edema; No deformity. Neuro:  Nonfocal  Psych: Normal affect   Labs    High Sensitivity Troponin:    Recent Labs  Lab 10/28/21 1945 10/28/21 2139  TROPONINIHS 53* 84*     Chemistry Recent Labs  Lab 11/06/21 0334 11/07/21 0440 11/08/21 0221  NA 132* 131* 132*  K 3.9 3.9 4.5  CL 91* 91* 90*  CO2 '30 29 31  '$ GLUCOSE 159* 132* 177*  BUN 88* 90* 87*  CREATININE 2.62* 2.71* 2.84*  CALCIUM 9.7 9.5 10.1  GFRNONAA 19* 18* 17*  ANIONGAP '11 11 11    '$ Lipids No results for input(s): "CHOL", "TRIG", "HDL", "LABVLDL", "LDLCALC", "CHOLHDL" in the last 168 hours.  Hematology Recent Labs  Lab 11/05/21 0437 11/06/21 0334 11/07/21 0440  WBC 10.6* 11.5* 9.8  RBC 3.63* 3.35* 3.47*  HGB 10.1* 9.6* 9.9*  HCT 32.0* 29.3* 31.1*  MCV 88.2 87.5 89.6  MCH 27.8 28.7 28.5  MCHC 31.6 32.8 31.8  RDW 16.4* 16.6* 16.6*  PLT 109* 110* 108*   Thyroid No results for input(s): "TSH", "FREET4" in the last 168 hours.  BNPNo results for input(s): "BNP", "PROBNP" in the last 168 hours.  DDimer No results for input(s): "DDIMER" in the last 168 hours.   Radiology    No results found.  Cardiac Studies     Patient Profile     76 y.o. female with a hx of persistent atrial fibrillation, CHF, CKD, DM, hypertension, hypothyroidism, hyperlipidemia, sleep apnea who is being seen for the evaluation of CHF   Assessment & Plan    1.Acute on chronic right sided HF - followed in HF clinic - 08/2021 echo LVEF 55-60%, no WMAs, D shaped septum, severe RV dysfunction, severe pulm HTN, mild to mod TTR - 10/2021 TEE (result in 11/03/21 note Dr Phillip Heal) LVEF 55%, severe RV dysfunction, severe TR, no endocarditis - - Per review of advanced heart failure notes, given patient's history of atrial fibrillation, low voltage QRS complexes, CKD, and polyneuropathy there was concern for possible amyloid - Underwent PYP scan on 8/8 with equivocal study per my review; unlikely amyloid is the primary driver of heart failure.  - suspect left sided dysfunction, OSA/OHS playing role.  - Unfortunately not candidate for SGLT2i given  frequent groin yeast infections   -she is on oral lasix '80mg'$  bid transitioned from IV , uptrending Cr. Hold lasix today and tomorrow, restart lasix '80mg'$  bid on Tuesday.    2. Afib - rate control with lopressor, eliquis for stroke prevention - history of oral bleeding with negative ENT evaluation in the past with recent bleeding, had been on low dose eliquis 2.'5mg'$  bid at home. Given afib, DVT this admission and no bleeding increase dose to '5mg'$  bid.    3. Upper extremity DVT - on eliquis   4. Bacteremia/Septic shock - abx per primary team - TEE without evidence of endocarditis   5 AKI on CKD - baseline Cr 2.2-2.5 - Cr up to 3. HOld lasix today (got AM dose), diuretic holiday. Has torsemide allergy. Resume her lasix '80mg'$  bid on Tuesday.    Reasonable for discharge today pending other medical issues. Family has some concerns about mobility, will forward to primary team   For questions or updates, please contact [place] Please consult [email or web address] for contact info under        Signed, Carlyle Dolly, MD  11/09/2021, 10:51 AM

## 2021-11-10 ENCOUNTER — Other Ambulatory Visit (HOSPITAL_COMMUNITY): Payer: Self-pay

## 2021-11-10 DIAGNOSIS — I4891 Unspecified atrial fibrillation: Secondary | ICD-10-CM

## 2021-11-10 DIAGNOSIS — A419 Sepsis, unspecified organism: Secondary | ICD-10-CM | POA: Diagnosis not present

## 2021-11-10 DIAGNOSIS — N184 Chronic kidney disease, stage 4 (severe): Secondary | ICD-10-CM | POA: Diagnosis not present

## 2021-11-10 DIAGNOSIS — J9601 Acute respiratory failure with hypoxia: Secondary | ICD-10-CM | POA: Diagnosis not present

## 2021-11-10 LAB — BASIC METABOLIC PANEL
Anion gap: 11 (ref 5–15)
BUN: 83 mg/dL — ABNORMAL HIGH (ref 8–23)
CO2: 31 mmol/L (ref 22–32)
Calcium: 9.9 mg/dL (ref 8.9–10.3)
Chloride: 93 mmol/L — ABNORMAL LOW (ref 98–111)
Creatinine, Ser: 2.95 mg/dL — ABNORMAL HIGH (ref 0.44–1.00)
GFR, Estimated: 16 mL/min — ABNORMAL LOW (ref >60)
Glucose, Bld: 168 mg/dL — ABNORMAL HIGH (ref 70–99)
Potassium: 3.9 mmol/L (ref 3.5–5.1)
Sodium: 135 mmol/L (ref 135–145)

## 2021-11-10 LAB — GLUCOSE, CAPILLARY
Glucose-Capillary: 169 mg/dL — ABNORMAL HIGH (ref 70–99)
Glucose-Capillary: 211 mg/dL — ABNORMAL HIGH (ref 70–99)

## 2021-11-10 MED ORDER — FUROSEMIDE 80 MG PO TABS: 80.0000 mg | ORAL_TABLET | Freq: Two times a day (BID) | ORAL | 3 refills | Status: DC

## 2021-11-10 MED ORDER — TRESIBA FLEXTOUCH 200 UNIT/ML ~~LOC~~ SOPN: 25.0000 [IU] | PEN_INJECTOR | Freq: Every day | SUBCUTANEOUS | Status: AC

## 2021-11-10 MED ORDER — METOPROLOL TARTRATE 25 MG PO TABS
12.5000 mg | ORAL_TABLET | Freq: Two times a day (BID) | ORAL | 2 refills | Status: AC
  Filled 2021-11-10: qty 60, 60d supply, fill #0

## 2021-11-10 MED ORDER — APIXABAN 5 MG PO TABS
5.0000 mg | ORAL_TABLET | Freq: Two times a day (BID) | ORAL | 2 refills | Status: AC
  Filled 2021-11-10: qty 60, 30d supply, fill #0

## 2021-11-10 MED ORDER — CEFADROXIL 500 MG PO CAPS
500.0000 mg | ORAL_CAPSULE | Freq: Two times a day (BID) | ORAL | 0 refills | Status: AC
  Filled 2021-11-10: qty 16, 8d supply, fill #0

## 2021-11-10 NOTE — Progress Notes (Signed)
Discharge paperwork reviewed with pt. Pt verbalized understanding. Pt alert and oriented x4 upon discharge. Pt's husband will transport pt home. Pt has taken her belongings with her along with medications delivered from [facility].

## 2021-11-10 NOTE — Social Work (Signed)
Family requests pt be transported home via [facility]. Address verified, paperwork on chart, called in transport request.

## 2021-11-10 NOTE — Progress Notes (Signed)
Pt's husband decided against transporting pt home by himself. Stated he would like ambulance transport. Social worker made aware and has set up transport with [facility]. Awaiting for [facility] to transport pt home. Husband still at bedside.

## 2021-11-10 NOTE — Discharge Summary (Addendum)
PATIENT DETAILS Name: Kristine [name] Age: 76 y.o. Sex: female Date of Birth: January 24, 1946 MRN: [id number]. Admitting Physician: Collier Bullock, MD NTZ:GYFV, Ravisankar, MD  Admit Date: 10/28/2021 Discharge date: 11/10/2021  Recommendations for Outpatient Follow-up:  Follow up with PCP in 1-2 weeks Please obtain CMP/CBC in one week Please ensure follow-up  Admitted From:  Home  Disposition: Home health   Discharge Condition: fair  CODE STATUS:   Code Status: Partial Code   Diet recommendation:  Diet Order             Diet - low sodium heart healthy           Diet heart healthy/carb modified Room service appropriate? Yes; Fluid consistency: Thin  Diet effective now                    Brief Summary: Patient is a 76 y.o.  female with a history of persistent atrial fibrillation, cardiac amyloidosis, chronic right-sided CHF, COPD, CKD stage IV, HTN, hypothyroidism, hyperlipidemia-presented with weakness/fatigue-patient was found to have sepsis due to streptococcal bacteremia.  See below for further details.   Significant events: 8/8>> admit to ICU-septic shock requiring vasopressors. 8/10>> transfer to [facility].   Significant studies: 8/8>> CT head: No acute intracranial abnormality 8/8>> CT chest/abdomen: Changes of cirrhosis, anasarca leg edema throughout abdominal wall. 8/10> MRI left knee: Diffuse subcutaneous soft tissue swelling-small fluid collection near lateral retinaculum. 8/11>> MRI left foot: No obvious acute osseous abnormality. 8/11>> MRI right foot: No obvious acute osseous abnormality. 8/13>> Doppler right upper extremity: DVT involving right axillary/right brachial vein.   Significant microbiology data: 8/8>> COVID PCR: Negative 8/8>> blood culture: Group B strep agalactiae. 8/11>> blood culture: No growth 8/11>> abscess left knee: No growth   Procedures: 8/11>> ultrasound-guided aspiration of small abscess in the superficial soft tissue of  the lateral aspect of the left knee. 8/14>>TEE-no vegetation   Consults: ID, plastics, cardiology, orthopedics  Brief Hospital Course: Septic shock due to streptococcal agalactiae bacteremia: Sepsis physiology has resolved-initially on IV Ancef-now on oral cefadroxil until 8/29 as recommended by ID.    Acute metabolic encephalopathy: Due to sepsis-mentation has improved-back to baseline.   Acute on chronic right-sided heart failure/HFpEF exacerbation: Volume status has stabilized-was on high-dose IV Lasix-subsequently transition to oral Lasix but had bump in creatinine and hence Lasix was held.  Per cardiology notes as of 8/20-okay to resume furosemide on 8/22.  Creatinine now downtrending.   History of cardiac amyloidosis: Cardiology following-defer further to cardiology.   Persistent atrial fibrillation: Rate controlled with metoprolol-on Eliquis.   AKI on CKD stage IV: Likely hemodynamically mediated-diuretic therapy related-downtrending-cardiology recommending reinitiating furosemide starting tomorrow.  Bilateral lower extremity ulcers/history of left knee hematoma-requiring evacuation and drain placement in March 2023: MRI left knee-MRI of bilateral foot without any evidence of infection.  Fluid from soft tissue in left knee that was aspirated negative for infection as well.  No evidence of abscess (ruled out)   HTN: BP stable-continue metoprolol.   DM-2: CBG stable on much lower doses of insulin than she is at home-continue Tresiba 26 units daily-SSI-keep a record of CBG readings and follow with PCP for further optimization.    Hypothyroidism: Continue Synthroid   Gout: Continue allopurinol   GERD: Continue PPI   Peripheral neuropathy: Continue Lyrica-and as needed tramadol   Depression/anxiety: Stable-continue cymbalta  Pressure Ulcer: Pressure Injury 10/29/21 Knee Anterior;Left Stage 2 -  Partial thickness loss of dermis presenting as a shallow open injury with a  red, pink  wound bed without slough. (Active)  10/29/21 0100  Location: Knee  Location Orientation: Anterior;Left  Staging: Stage 2 -  Partial thickness loss of dermis presenting as a shallow open injury with a red, pink wound bed without slough.  Wound Description (Comments):   Present on Admission: Yes  Dressing Type Foam - Lift dressing to assess site every shift 11/09/21 2000     Pressure Injury 10/29/21 Other (Comment) Left Stage 2 -  Partial thickness loss of dermis presenting as a shallow open injury with a red, pink wound bed without slough. (Active)  10/29/21 0100  Location: Other (Comment)  Location Orientation: Left  Staging: Stage 2 -  Partial thickness loss of dermis presenting as a shallow open injury with a red, pink wound bed without slough.  Wound Description (Comments):   Present on Admission: Yes  Dressing Type Foam - Lift dressing to assess site every shift 11/09/21 2000     Pressure Injury 10/29/21 Toe (Comment  which one) Anterior;Right Stage 2 -  Partial thickness loss of dermis presenting as a shallow open injury with a red, pink wound bed without slough. (Active)  10/29/21 0100  Location: Toe (Comment  which one)  Location Orientation: Anterior;Right  Staging: Stage 2 -  Partial thickness loss of dermis presenting as a shallow open injury with a red, pink wound bed without slough.  Wound Description (Comments):   Present on Admission: Yes  Dressing Type Foam - Lift dressing to assess site every shift 11/09/21 2000   Morbid Obesity: Estimated body mass index is 43.93 kg/m as calculated from the following:   Height as of this encounter: '5\' 9"'$  (1.753 m).   Weight as of this encounter: 134.9 kg.    Discharge Diagnoses:  Principal Problem:   Septic shock ([place]) Active Problems:   Diabetes mellitus, type 2 ([place])   CKD (chronic kidney disease) stage 4, GFR 15-29 ml/min (HCC)   Essential hypertension   Pulmonary hypertension (HCC)   Persistent atrial fibrillation  (HCC)   Paroxysmal atrial fibrillation (HCC)   Pressure injury of skin   Bacteremia   Cellulitis   Discharge Instructions:  Activity:  As tolerated with Full fall precautions use walker/cane & assistance as needed   Discharge Instructions     (HEART FAILURE PATIENTS) Call MD:  Anytime you have any of the following symptoms: 1) 3 pound weight gain in 24 hours or 5 pounds in 1 week 2) shortness of breath, with or without a dry hacking cough 3) swelling in the hands, feet or stomach 4) if you have to sleep on extra pillows at night in order to breathe.   Complete by: As directed    Call MD for:  difficulty breathing, headache or visual disturbances   Complete by: As directed    Call MD for:  persistant dizziness or light-headedness   Complete by: As directed    Diet - low sodium heart healthy   Complete by: As directed    Discharge instructions   Complete by: As directed    Follow with Primary MD  Avva, Ravisankar, MD in 1-2 weeks  Please get a complete blood count and chemistry panel checked by your Primary MD at your next visit, and again as instructed by your Primary MD.  Get Medicines reviewed and adjusted: Please take all your medications with you for your next visit with your Primary MD  Laboratory/radiological data: Please request your Primary MD to go over all hospital tests and procedure/radiological  results at the follow up, please ask your Primary MD to get all Hospital records sent to his/her office.  In some cases, they will be blood work, cultures and biopsy results pending at the time of your discharge. Please request that your primary care M.D. follows up on these results.  Also Note the following: If you experience worsening of your admission symptoms, develop shortness of breath, life threatening emergency, suicidal or homicidal thoughts you must seek medical attention immediately by calling 911 or calling your MD immediately  if symptoms less severe.  You must  read complete instructions/literature along with all the possible adverse reactions/side effects for all the Medicines you take and that have been prescribed to you. Take any new Medicines after you have completely understood and accpet all the possible adverse reactions/side effects.   Do not drive when taking Pain medications or sleeping medications (Benzodaizepines)  Do not take more than prescribed Pain, Sleep and Anxiety Medications. It is not advisable to combine anxiety,sleep and pain medications without talking with your primary care practitioner  Special Instructions: If you have smoked or chewed Tobacco  in the last 2 yrs please stop smoking, stop any regular Alcohol  and or any Recreational drug use.  Wear Seat belts while driving.  Please note: You were cared for by a hospitalist during your hospital stay. Once you are discharged, your primary care physician will handle any further medical issues. Please note that NO REFILLS for any discharge medications will be authorized once you are discharged, as it is imperative that you return to your primary care physician (or establish a relationship with a primary care physician if you do not have one) for your post hospital discharge needs so that they can reassess your need for medications and monitor your lab values.   Discharge wound care:   Complete by: As directed    Wound care  Every morning      Comments: Left knee: Apply Xeroform gauze over the and cover with a foam dressing. Change Xeroform daily.    Wound care  Every morning      Comments: Left base of the great toe: Clean with soap and water, rinse and pat dry then apply Aquaphor over the callused area and cover with a foam dressing.    Wound care  Every shift      Comments: Left upper medial thigh: After cleansing apply a layer of Aquaphor twice daily.   Increase activity slowly   Complete by: As directed       Allergies as of 11/10/2021       Reactions   Albuterol Other  (See Comments)   REACTION: Tachycardia- AFib   Epinephrine Other (See Comments)   Increases heart rate per patient.    Penicillins Other (See Comments)   REACTION: flushing' \\T'$ \ hot Did it involve swelling of the face/tongue/throat, SOB, or low BP? No Did it involve sudden or severe rash/hives, skin peeling, or any reaction on the inside of your mouth or nose? No Did you need to seek medical attention at a hospital or doctor's office? No When did it last happen?    20 years ago   If all above answers are "NO", may proceed with cephalosporin use.   UPDATE 11/05/21: Tolerated Cefazolin 10/29/21 to 11/05/21, switched to oral cefadroxil 11/05/21.   Ceftriaxone Other (See Comments)   Sweats.   UPDATE 11/05/21: Tolerated Cefazolin 10/29/21 to 11/05/21, switched to oral cefadroxil 11/05/21.   Citalopram Hydrobromide Other (See Comments)  Prolonged QT prolongation   Exenatide Other (See Comments)   Unknown reaction   Ketorolac Tromethamine Hives, Swelling   swelling in hands   Tizanidine Other (See Comments)   lethargic   Torsemide Swelling   Zoledronic Acid Other (See Comments)   Pt was hospitalized due to medication   Bee Venom Other (See Comments)   "makes me nervous"   Ivp Dye [iodinated Contrast Media] Other (See Comments)   flushing   Keflex [cephalexin] Other (See Comments)   Makes her feel warm; she was told never to take it.    UPDATE 11/05/21: Tolerated Cefazolin 10/29/21 to 11/05/21, switched to oral cefadroxil 11/05/21.        Medication List     STOP taking these medications    HYDROcodone-acetaminophen 5-325 MG tablet Commonly known as: NORCO/VICODIN   metolazone 2.5 MG tablet Commonly known as: ZAROXOLYN   metoprolol succinate 50 MG 24 hr tablet Commonly known as: TOPROL-XL   potassium chloride SA 20 MEQ tablet Commonly known as: KLOR-CON M       TAKE these medications    acetaminophen 500 MG tablet Commonly known as: TYLENOL Take 1,000 mg by mouth every 6 (six)  hours as needed for moderate pain or headache.   allopurinol 300 MG tablet Commonly known as: ZYLOPRIM Take 300 mg by mouth in the morning.   ALPRAZolam 0.5 MG tablet Commonly known as: XANAX Take 0.5 mg by mouth every 6 (six) hours as needed for anxiety.   apixaban 5 MG Tabs tablet Commonly known as: ELIQUIS Take 1 tablet (5 mg total) by mouth 2 (two) times daily. What changed:  medication strength how much to take   BD Pen Needle Nano U/F 32G X 4 MM Misc Generic drug: Insulin Pen Needle Inject 1 each as directed See admin instructions. Use pen needles with insulin pens daily   cefadroxil 500 MG capsule Commonly known as: DURICEF Take 1 capsule (500 mg total) by mouth 2 (two) times daily for 8 days.   diclofenac Sodium 1 % Gel Commonly known as: VOLTAREN Apply 4 g topically 4 (four) times daily as needed (back pain).   docusate sodium 100 MG capsule Commonly known as: COLACE Take 100 mg by mouth daily as needed for mild constipation.   DULoxetine 30 MG capsule Commonly known as: CYMBALTA Take 30 mg by mouth in the morning.   estradiol 0.1 MG/GM vaginal cream Commonly known as: ESTRACE Place 1 Applicatorful vaginally as needed (vaginal irritation).   furosemide 80 MG tablet Commonly known as: LASIX Take 1 tablet (80 mg total) by mouth 2 (two) times daily. Start taking on: November 11, 2021 What changed: how much to take   levothyroxine 88 MCG tablet Commonly known as: SYNTHROID Take 88 mcg by mouth every morning.   lidocaine-prilocaine cream Commonly known as: EMLA 1 gram qid prn What changed:  how much to take how to take this when to take this additional instructions   Lyrica 50 MG capsule Generic drug: pregabalin Take 50 mg by mouth 2 (two) times daily.   metoprolol tartrate 25 MG tablet Commonly known as: LOPRESSOR Take 0.5 tablets (12.5 mg total) by mouth 2 (two) times daily.   NovoLOG FlexPen 100 UNIT/ML FlexPen Generic drug: insulin  aspart Inject 40-70 Units into the skin 3 (three) times daily with meals. Per sliding scale:Less than 150 units 150=40 units151-199=45 units 200-249=50 units 250-299=55 units 300-349=60 units   nystatin powder Commonly known as: MYCOSTATIN/NYSTOP Apply 1 application  topically daily as  needed (skin irritation/rash).   ondansetron 4 MG tablet Commonly known as: Zofran Take 1 tablet (4 mg total) by mouth every 8 (eight) hours as needed for nausea or vomiting.   polyethylene glycol 17 g packet Commonly known as: MIRALAX / GLYCOLAX Take 17 g by mouth daily as needed for mild constipation.   Regranex 0.01 % gel Generic drug: becaplermin Apply 1 Application topically daily as needed for wound care. Apply to left foot   SYSTANE OP Place 1 drop into both eyes 2 (two) times daily as needed (dry eyes).   traMADol 50 MG tablet Commonly known as: ULTRAM Take 50 mg by mouth 3 (three) times daily as needed (severe neuropathy pain.).   Tyler Aas FlexTouch 200 UNIT/ML FlexTouch Pen Generic drug: insulin degludec Inject 26 Units into the skin daily. What changed:  how much to take when to take this   Vitamin D (Ergocalciferol) 1.25 MG (50000 UNIT) Caps capsule Commonly known as: DRISDOL Take 50,000 Units by mouth every 7 (seven) days.               Durable Medical Equipment  (From admission, onward)           Start     Ordered   10/30/21 1317  For home use only DME Walker rolling  Once       Question Answer Comment  Walker: With [place] Wheels   Patient needs a walker to treat with the following condition Weakness      10/30/21 1317              Discharge Care Instructions  (From admission, onward)           Start     Ordered   11/10/21 0000  Discharge wound care:       Comments: Wound care  Every morning      Comments: Left knee: Apply Xeroform gauze over the and cover with a foam dressing. Change Xeroform daily.    Wound care  Every morning      Comments:  Left base of the great toe: Clean with soap and water, rinse and pat dry then apply Aquaphor over the callused area and cover with a foam dressing.    Wound care  Every shift      Comments: Left upper medial thigh: After cleansing apply a layer of Aquaphor twice daily.   11/10/21 0844            Follow-up Information     Bensimhon, Shaune Pascal, MD Follow up.   Specialty: Cardiology Why: the office should call Contact information: [address] [place] [address] [phone]         Prince Solian, MD. Schedule an appointment as soon as possible for a visit in 1 week(s).   Specialty: Internal Medicine Contact information: [place] [address] [phone]                Allergies  Allergen Reactions   Albuterol Other (See Comments)    REACTION: Tachycardia- AFib   Epinephrine Other (See Comments)    Increases heart rate per patient.    Penicillins Other (See Comments)    REACTION: flushing' \\T'$ \ hot  Did it involve swelling of the face/tongue/throat, SOB, or low BP? No Did it involve sudden or severe rash/hives, skin peeling, or any reaction on the inside of your mouth or nose? No Did you need to seek medical attention at a hospital or doctor's office?  No When did it last happen?    20 years ago   If all above answers are "NO", may proceed with cephalosporin use.   UPDATE 11/05/21: Tolerated Cefazolin 10/29/21 to 11/05/21, switched to oral cefadroxil 11/05/21.    Ceftriaxone Other (See Comments)    Sweats.   UPDATE 11/05/21: Tolerated Cefazolin 10/29/21 to 11/05/21, switched to oral cefadroxil 11/05/21.   Citalopram Hydrobromide Other (See Comments)    Prolonged QT prolongation   Exenatide Other (See Comments)    Unknown reaction   Ketorolac Tromethamine Hives and Swelling    swelling in hands   Tizanidine Other (See Comments)    lethargic   Torsemide Swelling   Zoledronic Acid Other (See Comments)    Pt was hospitalized  due to medication   Bee Venom Other (See Comments)    "makes me nervous"   Ivp Dye [Iodinated Contrast Media] Other (See Comments)    flushing   Keflex [Cephalexin] Other (See Comments)    Makes her feel warm; she was told never to take it.    UPDATE 11/05/21: Tolerated Cefazolin 10/29/21 to 11/05/21, switched to oral cefadroxil 11/05/21.      Other Procedures/Studies: VAS [place] UPPER EXTREMITY VENOUS DUPLEX  Result Date: 11/03/2021 UPPER VENOUS STUDY  Patient Name:  GLORIAJEAN OKUN  Date of Exam:   11/02/2021 Medical Rec #: [id number]             Accession #:    [id number] Date of Birth: Apr 16, 1945            Patient Gender: F Patient Age:   6 years Exam [facility]:  [facility] Procedure:      VAS [place] UPPER EXTREMITY VENOUS DUPLEX Referring Phys: HEATHER PEMBERTON --------------------------------------------------------------------------------  Indications: Edema Comparison Study: No prior study Performing Technologist: Maudry Mayhew MHA, RDMS, RVT, RDCS  Examination Guidelines: A complete evaluation includes B-mode imaging, spectral Doppler, color Doppler, and power Doppler as needed of all accessible portions of each vessel. Bilateral testing is considered an integral part of a complete examination. Limited examinations for reoccurring indications may be performed as noted.  Right Findings: +----------+------------+---------+-----------+----------+-------+ RIGHT     CompressiblePhasicitySpontaneousPropertiesSummary +----------+------------+---------+-----------+----------+-------+ IJV           Full                                          +----------+------------+---------+-----------+----------+-------+ Subclavian    Full                                          +----------+------------+---------+-----------+----------+-------+ Axillary      None                 No                Acute  +----------+------------+---------+-----------+----------+-------+ Brachial       None                 No                Acute  +----------+------------+---------+-----------+----------+-------+ Radial        Full                                          +----------+------------+---------+-----------+----------+-------+  Ulnar         Full                                          +----------+------------+---------+-----------+----------+-------+ Cephalic      Full                                          +----------+------------+---------+-----------+----------+-------+ Basilic       Full                                          +----------+------------+---------+-----------+----------+-------+  Left Findings: +----------+------------+---------+-----------+----------+-------+ LEFT      CompressiblePhasicitySpontaneousPropertiesSummary +----------+------------+---------+-----------+----------+-------+ Subclavian               Yes       Yes                      +----------+------------+---------+-----------+----------+-------+  Summary:  Right: No evidence of superficial vein thrombosis in the upper extremity. Findings consistent with acute deep vein thrombosis involving the right axillary vein and right brachial veins.  Left: No evidence of thrombosis in the subclavian.  *See table(s) above for measurements and observations.  Diagnosing physician: Servando Snare MD Electronically signed by Servando Snare MD on 11/03/2021 at 2:28:18 PM.    Final    IR US Guide Bx Asp/Drain  Result Date: 11/01/2021 INDICATION: Bacteremia, small fluid collection in the lateral aspect of the knee EXAM: Ultrasound aspiration MEDICATIONS: The patient is currently admitted to the hospital and receiving intravenous antibiotics. The antibiotics were administered within an appropriate time frame prior to the initiation of the procedure. ANESTHESIA/SEDATION: None COMPLICATIONS: None immediate. PROCEDURE: Informed written consent was obtained from the patient after a thorough  discussion of the procedural risks, benefits and alternatives. All questions were addressed. Maximal Sterile Barrier Technique was utilized including caps, mask, sterile gowns, sterile gloves, sterile drape, hand hygiene and skin antiseptic. A timeout was performed prior to the initiation of the procedure. The lateral aspect of the left knee was interrogated with ultrasound. A small complex fluid collection is identified in the superficial soft tissues. The overlying skin was sterilely prepped and draped in the standard fashion using chlorhexidine skin prep. Local anesthesia was attained by infiltration with 1% lidocaine. An 18 gauge trocar needle was carefully advanced into the fluid collection under continuous ultrasound guidance. Aspiration was then performed yielding 1.5 mL thick purulent fluid. This was sent for Gram stain and culture. IMPRESSION: Successful aspiration of small abscess in the superficial soft tissues at the lateral aspect of the knee yielding 1.5 mL thick purulent fluid. This was sent for Gram stain and culture. Electronically Signed   By: Jacqulynn Cadet M.D.   On: 11/01/2021 07:07   MR FOOT RIGHT WO CONTRAST  Result Date: 10/31/2021 CLINICAL DATA:  Foot swelling, diabetic, osteomyelitis suspected, xray done EXAM: MRI OF THE RIGHT FOREFOOT WITHOUT CONTRAST TECHNIQUE: Multiplanar, multisequence MR imaging of the right forefoot was performed. No intravenous contrast was administered. COMPARISON:  X-ray 10/28/2021 FINDINGS: Technical note: Mildly motion degraded images. Bones/Joint/Cartilage No acute fracture. No dislocation. Moderate degenerative changes of the foot, most pronounced at the first MTP joint and within the midfoot.  No focal erosion or site of marrow replacement. Ligaments Intact Lisfranc ligament. Collateral ligaments of the forefoot appear intact. Muscles and Tendons Chronic denervation changes of the foot musculature. No tenosynovial fluid collection. Soft tissues Mild soft  tissue edema.  No organized fluid collection. IMPRESSION: 1. Mildly motion degraded exam. 2. No acute osseous abnormality of the right forefoot. 3. Mild soft tissue edema. No organized fluid collection. Electronically Signed   By: Davina Poke D.O.   On: 10/31/2021 18:56   MR FOOT LEFT WO CONTRAST  Result Date: 10/31/2021 CLINICAL DATA:  Foot swelling, diabetic, osteomyelitis suspected, xray done EXAM: MRI OF THE LEFT FOOT WITHOUT CONTRAST TECHNIQUE: Multiplanar, multisequence MR imaging of the left forefoot was performed. No intravenous contrast was administered. COMPARISON:  X-ray 10/28/2021 FINDINGS: Technical note: Severely motion limited images. There is poor fat saturation on T2 weighted images related to patient's hardware. Best possible images were obtained. Bones/Joint/Cartilage Susceptibility artifact from midfoot and first TMT effusions. No evidence of acute fracture or dislocation. No obvious sites of bone destruction or marrow replacement. Degenerative changes within the forefoot, better depicted radiographically. No large joint effusions are evident. Ligaments Suboptimally assessed. Muscles and Tendons Fatty atrophy of the foot musculature. No obvious tenosynovial fluid collection. Soft tissues Mild subcutaneous edema over the dorsum of the foot. No obvious fluid collections. IMPRESSION: 1. Severely limited exam. 2. No obvious acute osseous abnormality of the left forefoot. 3. Mild subcutaneous edema over the dorsum of the foot. No obvious fluid collections. Electronically Signed   By: Davina Poke D.O.   On: 10/31/2021 18:54   MR KNEE LEFT WO CONTRAST  Result Date: 10/30/2021 CLINICAL DATA:  Right knee pain. EXAM: MRI OF THE LEFT KNEE WITHOUT CONTRAST TECHNIQUE: Multiplanar, multisequence MR imaging of the knee was performed. No intravenous contrast was administered. COMPARISON:  Radiographs 10/28/2021 FINDINGS: Examination is extremely limited. There are 2 series other than the scout  sequence all of which demonstrate significant motion artifact. There is a small to moderate-sized knee joint effusion. I do not see any grossly abnormal T2 signal intensity to suggest septic arthritis or osteomyelitis. There is diffuse subcutaneous soft tissue swelling/edema/fluid suggesting cellulitis there is a small fluid collection superficial to the lower lateral retinaculum which measures 19 mm and could be a small abscess. Suspect diffuse myofasciitis. IMPRESSION: 1. Very limited examination due to motion artifact. 2. Diffuse subcutaneous soft tissue swelling/edema/fluid suggesting cellulitis. Small fluid collection near the lateral retinaculum could be a small subcutaneous abscess. 3. Suspect mild myofasciitis without definite findings for pyomyositis. 4. Small to moderate-sized knee joint effusion. 5. No grossly abnormal T2 signal intensity to suggest septic arthritis or osteomyelitis. Electronically Signed   By: Marijo Sanes M.D.   On: 10/30/2021 14:02   DG Chest Port 1 View  Result Date: 10/29/2021 CLINICAL DATA:  Septic shock, altered mental status EXAM: PORTABLE CHEST 1 VIEW COMPARISON:  09/17/2021, 10/28/2021 FINDINGS: Stable cardiomegaly. Pulmonary vascular congestion. Mild diffuse interstitial prominence. Streaky bibasilar opacities. No large pleural fluid collection. No pneumothorax. IMPRESSION: Cardiomegaly with pulmonary vascular congestion and mild interstitial edema. Streaky bibasilar opacities likely representing atelectasis. Electronically Signed   By: Davina Poke D.O.   On: 10/29/2021 08:53   DG Knee Left Port  Result Date: 10/28/2021 CLINICAL DATA:  Knee wound EXAM: PORTABLE LEFT KNEE - 1-2 VIEW COMPARISON:  None Available. FINDINGS: No evidence of fracture, dislocation, or joint effusion. No evidence of arthropathy or other focal bone abnormality. Soft tissues are unremarkable. IMPRESSION: Negative. Electronically Signed  By: Rolm Baptise M.D.   On: 10/28/2021 23:13   CT  CHEST ABDOMEN PELVIS WO CONTRAST  Result Date: 10/28/2021 CLINICAL DATA:  Sepsis, fever EXAM: CT CHEST, ABDOMEN AND PELVIS WITHOUT CONTRAST TECHNIQUE: Multidetector CT imaging of the chest, abdomen and pelvis was performed following the standard protocol without IV contrast. RADIATION DOSE REDUCTION: This exam was performed according to the departmental dose-optimization program which includes automated exposure control, adjustment of the mA and/or kV according to patient size and/or use of iterative reconstruction technique. COMPARISON:  02/19/2021 FINDINGS: CT CHEST FINDINGS Cardiovascular: Coronary artery and aortic calcifications. Heart is mildly enlarged. Aorta normal caliber. Mediastinum/Nodes: Mildly prominent mediastinal lymph nodes, stable since prior study, likely reactive. No axillary adenopathy. Lungs/Pleura: No confluent opacities or effusions. Musculoskeletal: Chest wall soft tissues are unremarkable. No acute bony abnormality. CT ABDOMEN PELVIS FINDINGS Hepatobiliary: Nodular contours of the liver suggest cirrhosis. No focal hepatic abnormality. Prior cholecystectomy. Pancreas: No focal abnormality or ductal dilatation. Spleen: Spleen his upper limits normal in size at 12.5 cm. No focal abnormality. Adrenals/Urinary Tract: No adrenal abnormality. No focal renal abnormality. No stones or hydronephrosis. Urinary bladder is unremarkable. Stomach/Bowel: Sigmoid diverticulosis. No active diverticulitis. Stomach and small bowel decompressed, unremarkable. Vascular/Lymphatic: Aortic atherosclerosis. No evidence of aneurysm or adenopathy. Reproductive: Prior hysterectomy.  No adnexal masses. Other: No free fluid or free air. Edema throughout the abdominal wall subcutaneous soft tissues. Musculoskeletal: No acute bony abnormality. IMPRESSION: Cardiomegaly, coronary artery disease. Aortic atherosclerosis. No acute cardiopulmonary disease. Changes of cirrhosis. Sigmoid diverticulosis. Anasarca like edema  throughout the abdominal wall. Electronically Signed   By: Rolm Baptise M.D.   On: 10/28/2021 20:37   DG Foot Complete Right  Result Date: 10/28/2021 CLINICAL DATA:  t brought in by family with complaints of fever and altered mental status. Incontinent of urine while staff attempting to get out of vehicle Family report cardiac procedure nuclear study toda EXAM: RIGHT FOOT COMPLETE - 3+ VIEW COMPARISON:  None Available. FINDINGS: There is no evidence of fracture or dislocation. Bite-like erosion of the base of the first digit fall fulfilling the overhanging edge. Degenerative changes of the distal interphalangeal joints. Plantar calcaneal spur. Cortical irregularity along the base of the fifth metatarsal likely old healed fracture. Soft tissues are unremarkable. IMPRESSION: Query gouty changes of the base of the proximal phalanx of the first digit. No definite findings of acute osteomyelitis. If concern, consider MRI for further evaluation. Electronically Signed   By: Iven Finn M.D.   On: 10/28/2021 20:30   CT Head Wo Contrast  Result Date: 10/28/2021 CLINICAL DATA:  Altered mental status EXAM: CT HEAD WITHOUT CONTRAST TECHNIQUE: Contiguous axial images were obtained from the base of the skull through the vertex without intravenous contrast. RADIATION DOSE REDUCTION: This exam was performed according to the departmental dose-optimization program which includes automated exposure control, adjustment of the mA and/or kV according to patient size and/or use of iterative reconstruction technique. COMPARISON:  05/06/2012 FINDINGS: Brain: There is atrophy and chronic small vessel disease changes. No acute intracranial abnormality. Specifically, no hemorrhage, hydrocephalus, mass lesion, acute infarction, or significant intracranial injury. Vascular: No hyperdense vessel or unexpected calcification. Skull: No acute calvarial abnormality. Sinuses/Orbits: No acute findings Other: None IMPRESSION: Atrophy, chronic  microvascular disease. No acute intracranial abnormality. Electronically Signed   By: Rolm Baptise M.D.   On: 10/28/2021 20:29   DG Foot Complete Left  Result Date: 10/28/2021 CLINICAL DATA:  Sore foot, fever EXAM: LEFT FOOT - COMPLETE 3+ VIEW COMPARISON:  None Available. FINDINGS: Postoperative changes in the midfoot and hindfoot from prior fusion. No acute bony abnormality. Specifically, no fracture, subluxation, or dislocation. No bone destruction to suggest osteomyelitis. Possible soft tissue ulcer at the base of the great toe. IMPRESSION: No acute bony abnormality. Electronically Signed   By: Rolm Baptise M.D.   On: 10/28/2021 20:28   NM CARDIAC AMYLOID TUMOR LOC INFLAM SPECT 1 DAY  Result Date: 10/28/2021 CLINICAL DATA:  HEART FAILURE. CONCERN FOR CARDIAC AMYLOIDOSIS. EXAM: NUCLEAR MEDICINE TUMOR LOCALIZATION. PYP CARDIAC AMYLOIDOSIS SCAN WITH SPECT TECHNIQUE: Following intravenous administration of radiopharmaceutical, anterior planar images of the chest were obtained. Regions of interest were placed on the heart and contralateral chest wall for quantitative assessment. Additional SPECT imaging of the chest could not be obtained due to thoracic deformity, body habitus and patient inability to lie flat or still due to back pain. RADIOPHARMACEUTICALS:  21.7 mCi TC- 55mPYROPHOSPHATE IV FINDINGS: Planar Visual assessment: Anterior planar imaging demonstrates radiotracer uptake within the heart less than uptake within the adjacent ribs (Grade 1). Quantitative assessment : Quantitative assessment of the cardiac uptake compared to the contralateral chest wall is equal to 1.05 (H/CL = 1.05). SPECT assessment: Unable to be performed, as above IMPRESSION: Visual and quantitative assessment (grade 1, H/CL = 1.05) is equivocally suggestive of transthyretin amyloidosis. Electronically Signed   By: MLavonia DanaM.D.   On: 10/28/2021 13:35     TODAY-DAY OF DISCHARGE:  Subjective:   BKensie Susmantoday has no  headache,no chest abdominal pain,no new weakness tingling or numbness, feels much better wants to go home today.   Objective:   Blood pressure (!) 120/97, pulse 86, temperature 98 F (36.7 C), temperature source Oral, resp. rate 16, height '5\' 9"'$  (1.753 m), weight 134.9 kg, SpO2 96 %. No intake or output data in the 24 hours ending 11/10/21 0845 Filed Weights   11/03/21 0417 11/05/21 0421 11/06/21 0500  Weight: 135 kg 135.8 kg 134.9 kg    Exam: Awake Alert, Oriented *3, No new F.N deficits, Normal affect [place].AT,PERRAL Supple Neck,No JVD, No cervical lymphadenopathy appriciated.  Symmetrical Chest wall movement, Good air movement bilaterally, CTAB RRR,No Gallops,Rubs or new Murmurs, No Parasternal Heave +ve B.Sounds, Abd Soft, Non tender, No organomegaly appriciated, No rebound -guarding or rigidity. No Cyanosis, Clubbing or edema, No new Rash or bruise   PERTINENT RADIOLOGIC STUDIES: No results found.   PERTINENT LAB RESULTS: CBC: No results for input(s): "WBC", "HGB", "HCT", "PLT" in the last 72 hours. CMET CMP     Component Value Date/Time   NA 135 11/10/2021 0330   NA 137 09/22/2021 1305   K 3.9 11/10/2021 0330   CL 93 (L) 11/10/2021 0330   CO2 31 11/10/2021 0330   GLUCOSE 168 (H) 11/10/2021 0330   BUN 83 (H) 11/10/2021 0330   BUN 62 (H) 09/22/2021 1305   CREATININE 2.95 (H) 11/10/2021 0330   CREATININE 2.50 (H) 10/14/2021 0858   CREATININE 1.76 (H) 04/03/2015 1130   CALCIUM 9.9 11/10/2021 0330   PROT 5.7 (L) 10/29/2021 0439   PROT 6.4 01/07/2021 1028   ALBUMIN 2.9 (L) 10/29/2021 0439   ALBUMIN 4.1 01/07/2021 1028   AST 16 10/29/2021 0439   AST 15 10/14/2021 0858   ALT 9 10/29/2021 0439   ALT 6 10/14/2021 0858   ALKPHOS 76 10/29/2021 0439   BILITOT 2.1 (H) 10/29/2021 0439   BILITOT 1.7 (H) 10/14/2021 0858   GFRNONAA 16 (L) 11/10/2021 0330   GFRNONAA 20 (L) 10/14/2021  0858   GFRAA 28 (L) 01/05/2019 0441    GFR Estimated Creatinine Clearance: 24.7 mL/min  (A) (by C-G formula based on SCr of 2.95 mg/dL (H)). No results for input(s): "LIPASE", "AMYLASE" in the last 72 hours. No results for input(s): "CKTOTAL", "CKMB", "CKMBINDEX", "TROPONINI" in the last 72 hours. Invalid input(s): "POCBNP" No results for input(s): "DDIMER" in the last 72 hours. No results for input(s): "HGBA1C" in the last 72 hours. No results for input(s): "CHOL", "HDL", "LDLCALC", "TRIG", "CHOLHDL", "LDLDIRECT" in the last 72 hours. No results for input(s): "TSH", "T4TOTAL", "T3FREE", "THYROIDAB" in the last 72 hours.  Invalid input(s): "FREET3" No results for input(s): "VITAMINB12", "FOLATE", "FERRITIN", "TIBC", "IRON", "RETICCTPCT" in the last 72 hours. Coags: No results for input(s): "INR" in the last 72 hours.  Invalid input(s): "PT" Microbiology: Recent Results (from the past 240 hour(s))  Culture, blood (Routine X 2) w Reflex to ID Panel     Status: None   Collection Time: 10/31/21 11:44 AM   Specimen: BLOOD  Result Value Ref Range Status   Specimen Description BLOOD BLOOD RIGHT HAND  Final   Special Requests   Final    BOTTLES DRAWN AEROBIC AND ANAEROBIC Blood Culture results may not be optimal due to an inadequate volume of blood received in culture bottles   Culture   Final    NO GROWTH 5 DAYS Performed at [facility], [place] [address], [place], [place] [address]    Report Status 11/05/2021 FINAL  Final  Culture, blood (Routine X 2) w Reflex to ID Panel     Status: None   Collection Time: 10/31/21 11:56 AM   Specimen: BLOOD  Result Value Ref Range Status   Specimen Description BLOOD BLOOD LEFT HAND  Final   Special Requests   Final    BOTTLES DRAWN AEROBIC ONLY Blood Culture adequate volume   Culture   Final    NO GROWTH 5 DAYS Performed at [facility], [place] [address], [place], [place] [address]    Report Status 11/05/2021 FINAL  Final  Aerobic/Anaerobic Culture w Gram Stain (surgical/deep wound)     Status: None   Collection Time:  10/31/21  5:15 PM   Specimen: Abscess  Result Value Ref Range Status   Specimen Description ABSCESS LEFT KNEE  Final   Special Requests NONE  Final   Gram Stain   Final    NO ORGANISMS SEEN MODERATE WBC PRESENT, PREDOMINANTLY PMN    Culture   Final    No growth aerobically or anaerobically. Performed at [facility], [place] [address] [place], [place] [address]    Report Status 11/05/2021 FINAL  Final    FURTHER DISCHARGE INSTRUCTIONS:  Get Medicines reviewed and adjusted: Please take all your medications with you for your next visit with your Primary MD  Laboratory/radiological data: Please request your Primary MD to go over all hospital tests and procedure/radiological results at the follow up, please ask your Primary MD to get all Hospital records sent to his/her office.  In some cases, they will be blood work, cultures and biopsy results pending at the time of your discharge. Please request that your primary care M.D. goes through all the records of your hospital data and follows up on these results.  Also Note the following: If you experience worsening of your admission symptoms, develop shortness of breath, life threatening emergency, suicidal or homicidal thoughts you must seek medical attention immediately by calling 911 or calling your MD  immediately  if symptoms less severe.  You must read complete instructions/literature along with all the possible adverse reactions/side effects for all the Medicines you take and that have been prescribed to you. Take any new Medicines after you have completely understood and accpet all the possible adverse reactions/side effects.   Do not drive when taking Pain medications or sleeping medications (Benzodaizepines)  Do not take more than prescribed Pain, Sleep and Anxiety Medications. It is not advisable to combine anxiety,sleep and pain medications without talking with your primary care practitioner  Special Instructions: If you  have smoked or chewed Tobacco  in the last 2 yrs please stop smoking, stop any regular Alcohol  and or any Recreational drug use.  Wear Seat belts while driving.  Please note: You were cared for by a hospitalist during your hospital stay. Once you are discharged, your primary care physician will handle any further medical issues. Please note that NO REFILLS for any discharge medications will be authorized once you are discharged, as it is imperative that you return to your primary care physician (or establish a relationship with a primary care physician if you do not have one) for your post hospital discharge needs so that they can reassess your need for medications and monitor your lab values.  Total Time spent coordinating discharge including counseling, education and face to face time equals greater than 30 minutes.  SignedOren Binet 11/10/2021 8:45 AM

## 2021-11-10 NOTE — Progress Notes (Signed)
Occupational Therapy Treatment Patient Details Name: Kristine [name] MRN: [id number] DOB: 01-29-1946 Today's Date: 11/10/2021   History of present illness 76 year old woman who [facility] 8/8 with fatigue and AMS. PMHx significant for HTN, HLD, AF (previously on Eliquis), mild AS, CHF with cardiac amyloidosis (TTR, diagnosed 10/2021), COPD with chronic bronchitis, OSA, T2DM, hypothyroidism, CKD stage IV, GERD, depression/anxiety.  Recent hospitalization 6/23 with [facility] and PT.   OT comments  Pt much more alert and participatory this visit. Moves slow and methodically with cues for hand placement and assist to rise and steady. Transferred to [facility], stood x 2 from [facility] for enema and transferred back to bed. Pt fatigues easily, requires sitting surfaces to be close. Pt completed grooming (brushing hair and washing face) with set up on BSC, total assist for pericare after large BM.    Recommendations for follow up therapy are one component of a multi-disciplinary discharge planning process, led by the attending physician.  Recommendations may be updated based on patient status, additional functional criteria and insurance authorization.    Follow Up Recommendations  Home health OT    Assistance Recommended at Discharge Frequent or constant Supervision/Assistance  Patient can return home with the following  A lot of help with bathing/dressing/bathroom;A little help with walking and/or transfers;Help with stairs or ramp for entrance;Assist for transportation;Assistance with cooking/housework   Equipment Recommendations  None recommended by OT    Recommendations for Other Services      Precautions / Restrictions Precautions Precautions: Fall Precaution Comments: open area to L plantar surface near great toe - orthotic shoes donned Restrictions Weight Bearing Restrictions: No       Mobility Bed Mobility Overal bed mobility: Needs Assistance Bed Mobility: Supine to Sit, Sit to  Supine     Supine to sit: Mod assist Sit to supine: Mod assist   General bed mobility comments: assist for trunk rise and completion of LE translation to EOB and for LEs back into bed    Transfers Overall transfer level: Needs assistance Equipment used: Rolling walker (2 wheels) Transfers: Sit to/from Stand, Bed to chair/wheelchair/BSC Sit to Stand: Mod assist     Step pivot transfers: Mod assist     General transfer comment: assist for power up, hip extension, steadying upon standing. STS x2, from EOB and from toilet while RN applied enema.     Balance Overall balance assessment: Needs assistance   Sitting balance-Leahy Scale: Fair       Standing balance-Leahy Scale: Poor Standing balance comment: reliant on RW for balance                           ADL either performed or assessed with clinical judgement   ADL Overall ADL's : Needs assistance/impaired     Grooming: Brushing hair;Wash/dry face;Sitting;Set up Grooming Details (indicate cue type and reason): on [facility]                 Toilet Transfer: Minimal assistance;+2 for safety/equipment;Stand-pivot;Rolling walker (2 wheels);BSC/3in1   Toileting- Clothing Manipulation and Hygiene: Total assistance;Sit to/from stand         General ADL Comments: frequent cues during transfer to and from [facility]    Extremity/Trunk Assessment              Vision       Perception     Praxis      Cognition Arousal/Alertness: Awake/alert Behavior During Therapy: Flat affect Overall Cognitive Status: Impaired/Different  from baseline Area of Impairment: Problem solving, Following commands, Safety/judgement, Attention, Memory                   Current Attention Level: Sustained Memory: Decreased short-term memory Following Commands: Follows one step commands with increased time, Follows one step commands consistently Safety/Judgement: Decreased awareness of deficits, Decreased awareness of safety    Problem Solving: Slow processing, Decreased initiation, Requires verbal cues, Requires tactile cues General Comments: Pt requires repeated cuing for form/safety during mobility        Exercises      Shoulder Instructions       General Comments      Pertinent Vitals/ Pain       Pain Assessment Pain Assessment: Faces Faces Pain Scale: Hurts little more Pain Location: abdomen, need to have BM Pain Descriptors / Indicators: Grimacing, Guarding Pain Intervention(s): Monitored during session  Home Living                                          Prior Functioning/Environment              Frequency  Min 2X/week        Progress Toward Goals  OT Goals(current goals can now be found in the care plan section)  Progress towards OT goals: Progressing toward goals  Acute Rehab OT Goals OT Goal Formulation: With patient/family Time For Goal Achievement: 11/13/21 Potential to Achieve Goals: Good  Plan Discharge plan remains appropriate    Co-evaluation                 AM-PAC OT "6 Clicks" Daily Activity     Outcome Measure   Help from another person eating meals?: None Help from another person taking care of personal grooming?: A Little Help from another person toileting, which includes using toliet, bedpan, or urinal?: A Lot Help from another person bathing (including washing, rinsing, drying)?: A Lot Help from another person to put on and taking off regular upper body clothing?: A Little Help from another person to put on and taking off regular lower body clothing?: A Lot 6 Click Score: 16    End of Session Equipment Utilized During Treatment: Rolling walker (2 wheels)  OT Visit Diagnosis: Unsteadiness on feet (R26.81);Muscle weakness (generalized) (M62.81)   Activity Tolerance Patient tolerated treatment well   Patient Left in bed;with call bell/phone within reach;with bed alarm set   Nurse Communication          Time:  0932-6712 OT Time Calculation (min): 23 min  Charges: OT General Charges $OT Visit: 1 Visit OT Treatments $Self Care/Home Management : 8-22 mins  Kristine [name] 11/10/2021, 2:03 PM Cleta Alberts, OTR/L [place] Office: [phone]

## 2021-11-10 NOTE — TOC Transition Note (Signed)
Transition of Care (TOC) - CM/SW Discharge Note Marvetta Gibbons RN, BSN Transitions of Care Unit 4E- RN Case Manager See Treatment Team for direct phone #  Cross Coverage 5W  Patient Details  Name: Kristine [name] MRN: [id number] Date of Birth: 1945/06/04  Transition of Care [facility]) CM/SW Contact:  Dawayne Patricia, RN Phone Number: 11/10/2021, 10:22 AM   Clinical Narrative:    Pt stable for transition home today, Per previous CM note DME-RW has been ordered from [facility] sent to bedside RN to check on delivery to room.   Pt was active with [facility] prior to admission and husband would like to continue services- CM has confirmed pt had RN/OT services- recommendations for HHPT as well- have asked MD to place HHRN/OT/OT orders for discharge and resumption of HH.   Levada Dy w/ [facility] aware of discharge and will follow up for resumption orders and resume needed services.   No further TOC needs noted. Pt to return home w/ spouse.    Final next level of care: Home w Home Health Services Barriers to Discharge: Barriers Resolved   Patient Goals and CMS Choice Patient states their goals for this hospitalization and ongoing recovery are:: husband wants patient to return home CMS Medicare.gov Compare Post Acute Care list provided to:: Patient Represenative (must comment) (spouse) Choice offered to / list presented to : Spouse  Discharge Placement                 Home w/ [facility]      Discharge Plan and Services   Discharge Planning Services: CM Consult Post Acute Care Choice: Home Health, Resumption of Svcs/PTA Provider, Durable Medical Equipment          DME Arranged: Gilford Rile DME Agency: [employer] Date DME Agency Contacted: 10/30/21 Time DME Agency Contacted: [phone] Representative spoke with at DME Agency: [place]: RN, PT, OT [place] Agency: [place] Date [place]: 11/10/21 Time [place]: 0017 Representative spoke with at [place]: [place] ([place]) Interventions     Readmission Risk Interventions    11/10/2021   10:18 AM 11/10/2021   10:17 AM 09/12/2021   12:58 PM  Readmission Risk Prevention Plan  Transportation Screening  Complete Complete  PCP or Specialist Appt within 3-5 Days Complete  Complete  HRI or [place]  Complete   Social Work Consult for [place] Planning/Counseling  Complete Complete  Palliative Care Screening  Not Applicable Not Applicable  Medication Review Press photographer)  Complete Complete

## 2021-11-10 NOTE — Progress Notes (Signed)
Physical Therapy Treatment Patient Details Name: Kristine [name] MRN: [id number] DOB: May 12, 1945 Today's Date: 11/10/2021   History of Present Illness 76 year old woman who presented to [facility] 8/8 with fatigue and AMS. PMHx significant for HTN, HLD, AF (previously on Eliquis), mild AS, CHF with cardiac amyloidosis (TTR, diagnosed 10/2021), COPD with chronic bronchitis, OSA, T2DM, hypothyroidism, CKD stage IV, GERD, depression/anxiety.  Recent hospitalization 6/23 with [facility] and PT.    PT Comments    Pt expresses excitement about d/c home today. Pt and family's main goal today was practicing functional mobility in preparation for d/c home, overall requiring mod physical assist for transfer-level mobility. Pt's husband present for part of session, he left once on [facility] and preparing to receive enema. Pt left up on [facility] with OT present.      Recommendations for follow up therapy are one component of a multi-disciplinary discharge planning process, led by the attending physician.  Recommendations may be updated based on patient status, additional functional criteria and insurance authorization.  Follow Up Recommendations  Home health PT     Assistance Recommended at Discharge Frequent or constant Supervision/Assistance  Patient can return home with the following Help with stairs or ramp for entrance;Assist for transportation;A lot of help with walking and/or transfers;A lot of help with bathing/dressing/bathroom   Equipment Recommendations  None recommended by PT    Recommendations for Other Services       Precautions / Restrictions Precautions Precautions: Fall Precaution Comments: open area to L plantar surface near great toe - orthotic shoes donned Restrictions Weight Bearing Restrictions: No     Mobility  Bed Mobility Overal bed mobility: Needs Assistance Bed Mobility: Supine to Sit     Supine to sit: Mod assist     General bed mobility comments: assist for trunk rise and  completion of LE translation to EOB.    Transfers Overall transfer level: Needs assistance Equipment used: Rolling walker (2 wheels) Transfers: Sit to/from Stand, Bed to chair/wheelchair/BSC Sit to Stand: Mod assist   Step pivot transfers: Mod assist       General transfer comment: assist for power up, hip extension, steadying upon standing. STS x2, from EOB and from toilet while RN applied enema.    Ambulation/Gait               General Gait Details: nt   Marine scientist Rankin (Stroke Patients Only)       Balance Overall balance assessment: Needs assistance Sitting-balance support: Feet supported, Bilateral upper extremity supported Sitting balance-Leahy Scale: Fair     Standing balance support: Bilateral upper extremity supported, Reliant on assistive device for balance Standing balance-Leahy Scale: Poor Standing balance comment: reliant on RW for balance                            Cognition Arousal/Alertness: Awake/alert Behavior During Therapy: Flat affect Overall Cognitive Status: Impaired/Different from baseline Area of Impairment: Problem solving, Following commands, Safety/judgement, Attention                 Orientation Level: Disoriented to, Time Current Attention Level: Sustained   Following Commands: Follows one step commands with increased time, Follows one step commands consistently     Problem Solving: Slow processing, Decreased initiation, Requires verbal cues, Requires tactile cues General Comments: Pt requires repeated cuing for form/safety during mobility  Exercises      General Comments        Pertinent Vitals/Pain Pain Assessment Pain Assessment: Faces Faces Pain Scale: Hurts little more Pain Location: abdomen, need to have BM Pain Descriptors / Indicators: Grimacing, Guarding Pain Intervention(s): Limited activity within patient's tolerance, Monitored  during session, Repositioned    Home Living                          Prior Function            PT Goals (current goals can now be found in the care plan section) Acute Rehab PT Goals Patient Stated Goal: to get back home PT Goal Formulation: With patient/family Time For Goal Achievement: 11/13/21 Potential to Achieve Goals: Good Progress towards PT goals: Progressing toward goals    Frequency    Min 3X/week      PT Plan Current plan remains appropriate    Co-evaluation              AM-PAC PT "6 Clicks" Mobility   Outcome Measure  Help needed turning from your back to your side while in a flat bed without using bedrails?: A Lot Help needed moving from lying on your back to sitting on the side of a flat bed without using bedrails?: A Lot Help needed moving to and from a bed to a chair (including a wheelchair)?: A Lot Help needed standing up from a chair using your arms (e.g., wheelchair or bedside chair)?: A Lot Help needed to walk in hospital room?: A Lot Help needed climbing 3-5 steps with a railing? : A Lot 6 Click Score: 12    End of Session   Activity Tolerance: Patient limited by fatigue Patient left: with call bell/phone within reach;Other (comment);in chair (on BSC, with OT in room) Nurse Communication: Mobility status PT Visit Diagnosis: Other abnormalities of gait and mobility (R26.89);Muscle weakness (generalized) (M62.81)     Time: 2080-2233; 1105-1110 PT Time Calculation (min) (ACUTE ONLY): 17 min  Charges:  $Therapeutic Activity: 8-22 mins                    Stacie Glaze, PT DPT Acute Rehabilitation Services Pager [phone]  Office [phone]    Louis Matte 11/10/2021, 12:35 PM

## 2021-11-13 ENCOUNTER — Telehealth: Payer: Self-pay

## 2021-11-13 NOTE — Telephone Encounter (Signed)
Pt's daughter called asking if Dr. Burr Medico could contact [employer] [facility] ([place]) to have the pt's labs drawn d/t pt convenience.  Notified Dr. Burr Medico of the pt's daughter's request.

## 2021-11-14 ENCOUNTER — Telehealth: Payer: Self-pay

## 2021-11-14 NOTE — Telephone Encounter (Signed)
Received call from patient's daughter Chrys Racer Via (ok to speak to her per [facility]), asking if in-person follow up appointment was recommended or if any bloodwork could be performed at home by [facility] RN. Encouraged daughter to keep scheduled follow-up appointment for patient, and confirmed date and time. Daughter stated understanding and had no further questions.  Binnie Kand, RN

## 2021-11-18 ENCOUNTER — Encounter: Payer: Self-pay | Admitting: Internal Medicine

## 2021-11-18 ENCOUNTER — Other Ambulatory Visit: Payer: Self-pay

## 2021-11-18 ENCOUNTER — Telehealth: Payer: Self-pay

## 2021-11-18 ENCOUNTER — Ambulatory Visit: Payer: Medicare HMO | Admitting: Internal Medicine

## 2021-11-18 VITALS — BP 105/69 | HR 86 | Temp 97.5°F

## 2021-11-18 DIAGNOSIS — L97401 Non-pressure chronic ulcer of unspecified heel and midfoot limited to breakdown of skin: Secondary | ICD-10-CM

## 2021-11-18 DIAGNOSIS — N184 Chronic kidney disease, stage 4 (severe): Secondary | ICD-10-CM | POA: Diagnosis not present

## 2021-11-18 DIAGNOSIS — B955 Unspecified streptococcus as the cause of diseases classified elsewhere: Secondary | ICD-10-CM

## 2021-11-18 DIAGNOSIS — E08621 Diabetes mellitus due to underlying condition with foot ulcer: Secondary | ICD-10-CM | POA: Diagnosis not present

## 2021-11-18 DIAGNOSIS — R7881 Bacteremia: Secondary | ICD-10-CM | POA: Diagnosis not present

## 2021-11-18 DIAGNOSIS — R600 Localized edema: Secondary | ICD-10-CM

## 2021-11-18 NOTE — Progress Notes (Unsigned)
RFV: follow up for hospitalization Patient ID: Kristine [name], female   DOB: 03-Jan-1946, 76 y.o.   MRN: [id number]  HPI 76 yo female with history of persistent atrial fibrillation, non-ischemic cardiomyopathy, chronic systolic CHF, CKD stage 4, DM, HTN, hypothyroidism, HLD and sleep apnea who is admitted in early august fro gropu b strep sepsis she is on her last day of oral abtx for group b strep sepsis-- She was admitted on 8/8 + group b strep bacteremia, cleared on 8/11 on repeat blood cx source thought to be septic knee vs. Left plantar DFU. She had aspirate of left knee abscess. Plan to treat with oral abtx through 8/29. She remains significantly immobile at home. Also found to have UE DVT. She is trying to work with home PT. Lower extremity swelling of legs at baseline.  Transported on stretcher  Has follow up with Dr Dagmar Hait, [place] and Burr Medico in the coming weeks   Outpatient Encounter Medications as of 11/18/2021  Medication Sig   acetaminophen (TYLENOL) 500 MG tablet Take 1,000 mg by mouth every 6 (six) hours as needed for moderate pain or headache.   allopurinol (ZYLOPRIM) 300 MG tablet Take 300 mg by mouth in the morning.   ALPRAZolam (XANAX) 0.5 MG tablet Take 0.5 mg by mouth every 6 (six) hours as needed for anxiety.   apixaban (ELIQUIS) 5 MG TABS tablet Take 1 tablet (5 mg total) by mouth 2 (two) times daily.   BD PEN NEEDLE NANO U/F 32G X 4 MM MISC Inject 1 each as directed See admin instructions. Use pen needles with insulin pens daily   cefadroxil (DURICEF) 500 MG capsule Take 1 capsule (500 mg total) by mouth 2 (two) times daily for 8 days.   diclofenac Sodium (VOLTAREN) 1 % GEL Apply 4 g topically 4 (four) times daily as needed (back pain).   docusate sodium (COLACE) 100 MG capsule Take 100 mg by mouth daily as needed for mild constipation.   DULoxetine (CYMBALTA) 30 MG capsule Take 30 mg by mouth in the morning.   estradiol (ESTRACE) 0.1 MG/GM vaginal cream Place 1  Applicatorful vaginally as needed (vaginal irritation).   furosemide (LASIX) 80 MG tablet Take 1 tablet (80 mg total) by mouth 2 (two) times daily.   insulin degludec (TRESIBA FLEXTOUCH) 200 UNIT/ML FlexTouch Pen Inject 26 Units into the skin daily.   levothyroxine (SYNTHROID) 88 MCG tablet Take 88 mcg by mouth every morning.   lidocaine-prilocaine (EMLA) cream 1 gram qid prn (Patient taking differently: Apply 1 Application topically once.)   LYRICA 50 MG capsule Take 50 mg by mouth 2 (two) times daily.   metoprolol tartrate (LOPRESSOR) 25 MG tablet Take 0.5 tablets (12.5 mg total) by mouth 2 (two) times daily.   nystatin (MYCOSTATIN/NYSTOP) powder Apply 1 application  topically daily as needed (skin irritation/rash).   ondansetron (ZOFRAN) 4 MG tablet Take 1 tablet (4 mg total) by mouth every 8 (eight) hours as needed for nausea or vomiting.   Polyethyl Glycol-Propyl Glycol (SYSTANE OP) Place 1 drop into both eyes 2 (two) times daily as needed (dry eyes).   polyethylene glycol (MIRALAX / GLYCOLAX) 17 g packet Take 17 g by mouth daily as needed for mild constipation.   REGRANEX 0.01 % gel Apply 1 Application topically daily as needed for wound care. Apply to left foot   traMADol (ULTRAM) 50 MG tablet Take 50 mg by mouth 3 (three) times daily as needed (severe neuropathy pain.).   Vitamin D, Ergocalciferol, (DRISDOL)  50000 UNITS CAPS Take 50,000 Units by mouth every 7 (seven) days.   allopurinol (ZYLOPRIM) 300 MG tablet 1 tablet Orally Once a day for 90 days (Patient not taking: Reported on 11/18/2021)   insulin aspart (NOVOLOG FLEXPEN) 100 UNIT/ML FlexPen Inject 40-70 Units into the skin 3 (three) times daily with meals. Per sliding scale:Less than 150 units 150=40 units151-199=45 units 200-249=50 units 250-299=55 units 300-349=60 units   No facility-administered encounter medications on file as of 11/18/2021.     Patient Active Problem List   Diagnosis Date Noted   Atrial fibrillation with rapid  ventricular response ([place])    Bacteremia    Cellulitis    Pressure injury of skin 10/29/2021   Septic shock ([place]) 10/28/2021   Acute pulmonary edema (HCC)    Acute right-sided heart failure (HCC)    CHF (congestive heart failure) ([place]) 09/11/2021   Hypokalemia 09/11/2021   Hematoma of left knee region 05/14/2021   DM type 2 with diabetic peripheral neuropathy ([place]) 01/07/2021   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 ([place]) 08/31/2019   Moderate nonproliferative diabetic retinopathy of right eye ([place]) 08/31/2019   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   Chronic combined systolic and diastolic CHF (congestive heart failure) ([place]) 06/29/2019   Gout 01/03/2019   Pulmonary hypertension ([place]) 01/03/2019   Essential hypertension 01/03/2019   Candidiasis, intertriginous 01/03/2019   CKD (chronic kidney disease) stage 4, GFR 15-29 ml/min (HCC) 06/03/2018   Gait abnormality 02/09/2017   Obstructive sleep apnea 02/09/2017   Diabetic peripheral neuropathy ([place]) 09/02/2016   Peripheral neuropathy 07/29/2016   Spinal stenosis of lumbar region 07/29/2016   Excessive daytime sleepiness 07/29/2016   Morbid obesity ([place]) 07/29/2016   Anemia 05/31/2015   Acute on chronic combined systolic and diastolic CHF (congestive heart failure) (HCC) 05/31/2015   Paroxysmal atrial fibrillation ([place])    Obesity 88/41/6606   Diastolic dysfunction with chronic heart failure ([place]) 06/23/2013   Persistent atrial fibrillation ([place]) 06/20/2010   Pneumonia    Abnormal cardiovascular function study    High cholesterol    Scoliosis    Spinal stenosis    Anxiety    Osteoarthritis    Osteoporosis    Mild aortic sclerosis    Degenerative disc disease    Vitamin D deficiency    HEMOPTYSIS UNSPECIFIED 05/12/2010   Diabetes mellitus, type 2 ([place]) 11/19/2009   Hypertensive cardiovascular disease 11/19/2009   Dyspnea 11/19/2009     Health Maintenance Due  Topic Date Due   FOOT EXAM  Never done    OPHTHALMOLOGY EXAM  Never done   URINE MICROALBUMIN  Never done   Hepatitis C Screening  Never done   Zoster Vaccines- Shingrix (1 of 2) Never done   DEXA SCAN  Never done   Pneumonia Vaccine 49+ Years old (2 - PPSV23 or PCV20) 11/09/2013   COVID-19 Vaccine (2 - Moderna series) 07/26/2019   MAMMOGRAM  08/29/2021   COLONOSCOPY (Pts 45-71yr Insurance coverage will need to be confirmed)  09/08/2021   INFLUENZA VACCINE  10/21/2021     Review of Systems  Physical Exam   BP 105/69   Pulse 86   Temp (!) 97.5 F (36.4 C) (Oral)   SpO2 94% Comment: 2L O2 [place]  -left knee small superficial wound-wound bed is clean Left foot plantar ulcer -shallow slight serous drainage Anterior tibial ulcer- "spontaneous leak" Lab Results  Component Value Date   LABRPR Non Reactive 07/29/2016    CBC Lab Results  Component Value Date   WBC  9.8 11/07/2021   RBC 3.47 (L) 11/07/2021   HGB 9.9 (L) 11/07/2021   HCT 31.1 (L) 11/07/2021   PLT 108 (L) 11/07/2021   MCV 89.6 11/07/2021   MCH 28.5 11/07/2021   MCHC 31.8 11/07/2021   RDW 16.6 (H) 11/07/2021   LYMPHSABS 0.7 10/29/2021   MONOABS 1.8 (H) 10/29/2021   EOSABS 0.0 10/29/2021    BMET Lab Results  Component Value Date   NA 135 11/10/2021   K 3.9 11/10/2021   CL 93 (L) 11/10/2021   CO2 31 11/10/2021   GLUCOSE 168 (H) 11/10/2021   BUN 83 (H) 11/10/2021   CREATININE 2.95 (H) 11/10/2021   CALCIUM 9.9 11/10/2021   GFRNONAA 16 (L) 11/10/2021   GFRAA 28 (L) 01/05/2019     Assessment and Plan  Will try to get labs for cbc and bmp - for her upcoming appt. With renal  Pedal edema = recommend to take 1 additional dose of lasix today  Group b strep = completed her course of abtx today. No need for further treatment areas of involvement appear improved.

## 2021-11-18 NOTE — Patient Instructions (Signed)
recommend to take 1 additional dose of lasix today to decrease the swelling on lower extremities. Will send labs to dr Dagmar Hait and

## 2021-11-18 NOTE — Telephone Encounter (Signed)
Received call from Delilah Shan, home health RN with [facility], requesting lab orders. He is at patient's home to do venipuncture.   Per Dr. Baxter Flattery, gave verbal orders for CBC, CMP, ESR, and CRP.   Delilah Shan: [phone]  Beryle Flock, RN

## 2021-11-19 LAB — BASIC METABOLIC PANEL
BUN/Creatinine Ratio: 21 (calc) (ref 6–22)
BUN: 60 mg/dL — ABNORMAL HIGH (ref 7–25)
CO2: 27 mmol/L (ref 20–32)
Calcium: 9.9 mg/dL (ref 8.6–10.4)
Chloride: 95 mmol/L — ABNORMAL LOW (ref 98–110)
Creat: 2.88 mg/dL — ABNORMAL HIGH (ref 0.60–1.00)
Glucose, Bld: 183 mg/dL — ABNORMAL HIGH (ref 65–99)
Potassium: 4.4 mmol/L (ref 3.5–5.3)
Sodium: 136 mmol/L (ref 135–146)

## 2021-11-19 LAB — CBC WITH DIFFERENTIAL/PLATELET
Absolute Monocytes: 555 cells/uL (ref 200–950)
Basophils Absolute: 113 cells/uL (ref 0–200)
Basophils Relative: 1.5 %
Eosinophils Absolute: 263 cells/uL (ref 15–500)
Eosinophils Relative: 3.5 %
HCT: 33.9 % — ABNORMAL LOW (ref 35.0–45.0)
Hemoglobin: 10.5 g/dL — ABNORMAL LOW (ref 11.7–15.5)
Lymphs Abs: 1290 cells/uL (ref 850–3900)
MCH: 28.1 pg (ref 27.0–33.0)
MCHC: 31 g/dL — ABNORMAL LOW (ref 32.0–36.0)
MCV: 90.6 fL (ref 80.0–100.0)
MPV: 10.9 fL (ref 7.5–12.5)
Monocytes Relative: 7.4 %
Neutro Abs: 5280 cells/uL (ref 1500–7800)
Neutrophils Relative %: 70.4 %
Platelets: 151 10*3/uL (ref 140–400)
RBC: 3.74 10*6/uL — ABNORMAL LOW (ref 3.80–5.10)
RDW: 15 % (ref 11.0–15.0)
Total Lymphocyte: 17.2 %
WBC: 7.5 10*3/uL (ref 3.8–10.8)

## 2021-11-19 LAB — MAGNESIUM: Magnesium: 2.6 mg/dL — ABNORMAL HIGH (ref 1.5–2.5)

## 2021-11-19 LAB — PHOSPHORUS: Phosphorus: 4.7 mg/dL — ABNORMAL HIGH (ref 2.1–4.3)

## 2021-11-26 ENCOUNTER — Telehealth (HOSPITAL_COMMUNITY): Payer: Self-pay

## 2021-11-26 NOTE — Progress Notes (Addendum)
Advanced Heart Failure Clinic Note  Primary Care: Prince Solian, MD Primary Cardiologist: Dr. Acie Fredrickson Podiatry: Dr. Irving Shows Nephrology: Dr. Joelyn Oms HF Cardiologist: Dr. Haroldine Laws  HPI: Kristine [name] is a 76 y.o. woman with chronic diastolic heart failure, cor pulmonale, PH, OSA, HTN, chronic atrial fibrillation, DM2 for > 20 yrs c/b Charcot foot, CKD IV and progressive polyneuropathy including h/o carpal tunnel syndrome s/p CTR and spinal stenosis.   Initially referred by Dr. Acie Fredrickson to the [facility] for further evaluation of HF/PAH. Seen by Dr. Haroldine Laws in 2022 but lost to f/u.   She has a long h/o of diastolic HF and AF. Has been on multiple AADs but now has chronic AF. Echo 4/15 EF 50% mild LVH grade 2 DD. RV mildly reduced.    Echo 5/22 EF 45-50%. RV severely reduced RVSP 55. D-shaped septum. Severe biatrial enlargement . Moderate pericardial effusion    Dr. 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 Diuretics increased and strongly suggested compliance with CPAP and weight loss.  She had return f/u scheduled for 12/22, but no showed.   Admitted 6/23 for a/c CHF w/ mainly right sided symptoms, in the setting of recent dose reduction in diuretics due to worsening renal function. Echo showed normal LVEF 55-60%, RV severely enlarged w/ severely reduced systolic function and severely elevated RVSP 60 mmgH, mild-mod TR. No MR.  She responded well to IV diuresis and metolazone>>transitioned to PO lasix 133m BID and metolazone 2.5 mg M, W, F. SCr remained ~2.5 c/w baseline. [facility]   [facility] amyloid work up started. PYP 8/23 equivocal.   Admitted 8/23 to a/c CHF and septic shock (group b streptococcal bacteremia). Dr. BHaroldine Lawsperformed TEE showing LVEF 55%, markedly HK RV, severe TR, no endocarditis, moderate pericardial effusion/no tamponade. Continued with IV diuresis. ID consulted, started on IV Anfel-->cefadroxil. Source felt to be  septic knee vs left plantar diabetic foot ulcer. Also found to have + DVT RUE, on low-dose Eliquis but increased to 5 bid. GDMT limited by worsening SCr, but able to transition to Lasix 80 bid. Remained off metolazone. Discharged home, weight 296 lbs.  Today she returns for post hospitalization HF follow up with her husband. Overall feeling fine. She has walked twice with PT with a walker at home with a WC behind her. Has more swelling in legs. Husband dressing ulcer on left foot, says it looks better. She is not SOB with PT, mostly fatigued. Denies palpitations, abnormal bleeding, CP, dizziness,  or PND/Orthopnea. Appetite ok. No fever or chills. Weight at home 303 pounds. Taking all medications. Married to husband for 55 years. She has several children and grandchildren. He has [place] 2-3x/week with [employer].  Cardiac Testing   - TEE (8/23): LVEF 55%, markedly HK RV, severe TR, no endocarditis, moderate pericardial effusion/no tamponade.  - PYP scan (8/23): equivocal, grade 1, H/CL = 1.05  - Echo (6/23): EF 55-60%, RV severely enlarged w/ severely reduced systolic function and severely elevated RVSP 60 mmgH, mild-mod TR. No MR.   - Echo (5/22): EF 45-50%. RV severely reduced RVSP 55. D-shaped septum. Severe biatrial enlargement . Moderate pericardial effusion   - Echo (4/15): EF 50% mild LVH grade 2 DD. RV mildly reduced.   Review of Systems: [y] = yes, [ ]  = no   General: Weight gain [Blue.Reese]; Weight loss [ ] ; Anorexia [ ] ; Fatigue [ y]; Fever [ ] ; Chills [ ] ;  Weakness [ ]   Cardiac: Chest pain/pressure [ ] ; Resting SOB [ ] ; Exertional SOB Blue.Reese ]; Orthopnea [ ] ; Pedal Edema Blue.Reese ]; Palpitations [ ] ; Syncope [ ] ; Presyncope [ ] ; Paroxysmal nocturnal dyspnea[ ]   Pulmonary: Cough [ ] ; Wheezing[ ] ; Hemoptysis[ ] ; Sputum [ ] ; Snoring [ ]   GI: Vomiting[ ] ; Dysphagia[ ] ; Melena[ ] ; Hematochezia [ ] ; Heartburn[ ] ; Abdominal pain [ ] ; Constipation [ ] ; Diarrhea [ ] ; BRBPR [ ]   GU: Hematuria[ ] ; Dysuria [  ]; Nocturia[ ]   Vascular: Pain in legs with walking [ ] ; Pain in feet with lying flat [ ] ; Non-healing sores Blue.Reese ]; Stroke [ ] ; TIA [ ] ; Slurred speech [ ] ;  Neuro: Headaches[ ] ; Vertigo[ ] ; Seizures[ ] ; Paresthesias[ Y ];Blurred vision [ ] ; Diplopia [ ] ; Vision changes [ ]   Ortho/Skin: Arthritis [ ] ; Joint pain [Y ]; Muscle pain [ ] ; Joint swelling [ ] ; Back Pain [Y ]; Rash [ ]   Psych: Depression[ ] ; Anxiety[ ]   Heme: Bleeding problems [ ] ; Clotting disorders [ ] ; Anemia [ ]   Endocrine: Diabetes [Y ]; Thyroid dysfunction[ ]    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   Anxiety    Arthritis    Asthma    Atrial fibrillation ([place])    CAP (community acquired pneumonia) 05/31/2015   Cataract    CHF (congestive heart failure) (HCC)    Chronic bronchitis (HCC)    Chronic kidney disease    Clotting disorder ([place])    Community acquired pneumonia 05/31/2015   Depression    Dysrhythmia    GERD (gastroesophageal reflux disease)    High cholesterol    History of hiatal hernia    HTN (hypertension)    Mild aortic sclerosis    Morbid obesity (HCC)    Neuromuscular disorder (HCC)    Neuropathic pain    Osteoporosis    Persistent atrial fibrillation (HCC)    Scoliosis    Sleep apnea    Spinal stenosis    Thyroid disease    Type II diabetes mellitus (HCC)    Vitamin D deficiency    Current Outpatient Medications  Medication Sig Dispense Refill   acetaminophen (TYLENOL) 500 MG tablet Take 1,000 mg by mouth every 6 (six) hours as needed for moderate pain or headache.     allopurinol (ZYLOPRIM) 300 MG tablet Take 300 mg by mouth in the morning.     allopurinol (ZYLOPRIM) 300 MG tablet      ALPRAZolam (XANAX) 0.5 MG tablet Take 0.5 mg by mouth every 6 (six) hours as needed for anxiety.     apixaban (ELIQUIS) 5 MG TABS tablet Take 1 tablet (5 mg total) by mouth 2 (two) times daily. 60 tablet 2   BD PEN NEEDLE NANO U/F 32G X 4 MM MISC Inject 1 each as  directed See admin instructions. Use pen needles with insulin pens daily  11   diclofenac Sodium (VOLTAREN) 1 % GEL Apply 4 g topically 4 (four) times daily as needed (back pain).     docusate sodium (COLACE) 100 MG capsule Take 100 mg by mouth daily as needed for mild constipation.     DULoxetine (CYMBALTA) 30 MG capsule Take 30 mg by mouth in the morning.     estradiol (ESTRACE) 0.1 MG/GM vaginal cream Place 1 Applicatorful vaginally as needed (vaginal irritation).     furosemide (LASIX) 80 MG tablet Take 1 tablet (80 mg total) by mouth 2 (two)  times daily. 90 tablet 3   insulin aspart (NOVOLOG FLEXPEN) 100 UNIT/ML FlexPen Inject 40-70 Units into the skin 3 (three) times daily with meals. Per sliding scale:Less than 150 units 150=40 units151-199=45 units 200-249=50 units 250-299=55 units 300-349=60 units     insulin degludec (TRESIBA FLEXTOUCH) 200 UNIT/ML FlexTouch Pen Inject 26 Units into the skin daily.     levothyroxine (SYNTHROID) 88 MCG tablet Take 88 mcg by mouth every morning.     lidocaine-prilocaine (EMLA) cream 1 gram qid prn (Patient taking differently: Apply 1 Application topically once.) 30 g 11   LYRICA 50 MG capsule Take 50 mg by mouth 2 (two) times daily.  3   metoprolol tartrate (LOPRESSOR) 25 MG tablet Take 0.5 tablets (12.5 mg total) by mouth 2 (two) times daily. 60 tablet 2   nystatin (MYCOSTATIN/NYSTOP) powder Apply 1 application  topically daily as needed (skin irritation/rash).     ondansetron (ZOFRAN) 4 MG tablet Take 1 tablet (4 mg total) by mouth every 8 (eight) hours as needed for nausea or vomiting. 20 tablet 0   Polyethyl Glycol-Propyl Glycol (SYSTANE OP) Place 1 drop into both eyes 2 (two) times daily as needed (dry eyes).     polyethylene glycol (MIRALAX / GLYCOLAX) 17 g packet Take 17 g by mouth daily as needed for mild constipation.     REGRANEX 0.01 % gel Apply 1 Application topically daily as needed for wound care. Apply to left foot     traMADol (ULTRAM) 50 MG  tablet Take 50 mg by mouth 3 (three) times daily as needed (severe neuropathy pain.).     Vitamin D, Ergocalciferol, (DRISDOL) 50000 UNITS CAPS Take 50,000 Units by mouth every 7 (seven) days.     No current facility-administered medications for this encounter.   Allergies  Allergen Reactions   Albuterol Other (See Comments)    REACTION: Tachycardia- AFib   Epinephrine Other (See Comments)    Increases heart rate per patient.    Penicillins Other (See Comments)    REACTION: flushing \T\ hot  Did it involve swelling of the face/tongue/throat, SOB, or low BP? No Did it involve sudden or severe rash/hives, skin peeling, or any reaction on the inside of your mouth or nose? No Did you need to seek medical attention at a hospital or doctor's office? No When did it last happen?    20 years ago   If all above answers are "NO", may proceed with cephalosporin use.   UPDATE 11/05/21: Tolerated Cefazolin 10/29/21 to 11/05/21, switched to oral cefadroxil 11/05/21.    Ceftriaxone Other (See Comments)    Sweats.   UPDATE 11/05/21: Tolerated Cefazolin 10/29/21 to 11/05/21, switched to oral cefadroxil 11/05/21.   Citalopram Hydrobromide Other (See Comments)    Prolonged QT prolongation   Exenatide Other (See Comments)    Unknown reaction   Ketorolac Tromethamine Hives and Swelling    swelling in hands   Tizanidine Other (See Comments)    lethargic   Torsemide Swelling   Zoledronic Acid Other (See Comments)    Pt was hospitalized due to medication   Bee Venom Other (See Comments)    "makes me nervous"   Ivp Dye [Iodinated Contrast Media] Other (See Comments)    flushing   Keflex [Cephalexin] Other (See Comments)    Makes her feel warm; she was told never to take it.    UPDATE 11/05/21: Tolerated Cefazolin 10/29/21 to 11/05/21, switched to oral cefadroxil 11/05/21.     Social History   Socioeconomic  History   Marital status: Married    Spouse name: Kristine [name]   Number of children: 4   Years of  education: 14   Highest education level: Bachelor's degree (e.g., BA, AB, BS)  Occupational History   Occupation: Retired  Tobacco Use   Smoking status: Never   Smokeless tobacco: Never  Vaping Use   Vaping Use: Never used  Substance and Sexual Activity   Alcohol use: No   Drug use: No   Sexual activity: Yes  Other Topics Concern   Not on file  Social History Narrative   Pt lives in [place] with spouse.   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   Right-handed.   1 cup caffeine per day.   Social Determinants of Health   Financial Resource Strain: Low Risk  (09/12/2021)   Overall Financial Resource Strain (CARDIA)    Difficulty of Paying Living Expenses: Not very hard  Food Insecurity: No Food Insecurity (09/12/2021)   Hunger Vital Sign    Worried About Running Out of Food in the Last Year: Never true    Ran Out of Food in the Last Year: Never true  Transportation Needs: No Transportation Needs (09/12/2021)   PRAPARE - Hydrologist (Medical): No    Lack of Transportation (Non-Medical): No  Physical Activity: Not on file  Stress: Not on file  Social Connections: Not on file  Intimate Partner Violence: Not on file   Family History  Problem Relation Age of Onset   Stroke Father    Hypertension Father    Atrial fibrillation Father        HAD MURMUR   Heart failure Mother    Congestive Heart Failure Mother    Hypertension Mother    Hypertension Brother    Hypertension Sister    Hypertension Son    CAD Sister        EARLY   CAD Brother        EARLY   Colon cancer Neg Hx    Esophageal cancer Neg Hx    Stomach cancer Neg Hx    Rectal cancer Neg Hx    BP 126/70   Pulse (!) 102   Wt (!) 138.2 kg (304 lb 9.6 oz)   SpO2 92%   BMI 44.98 kg/m   Wt Readings from Last 3 Encounters:  11/27/21 (!) 138.2 kg (304 lb 9.6 oz)  11/06/21 134.9 kg (297  lb 8 oz)  10/19/21 131.3 kg (289 lb 8 oz)   PHYSICAL EXAM: General:  NAD. No resp difficulty, chronically-ill appearing, [facility] HEENT: Normal Neck: Supple. thick neck. Carotids 2+ bilat; no bruits. No lymphadenopathy or thryomegaly appreciated. Cor: PMI nondisplaced. Irregular rate & rhythm. No rubs, gallops , 2/6 TR Lungs: Clear, diminished in bases Abdomen: Obese, nontender, nondistended. No hepatosplenomegaly. No bruits or masses. Good bowel sounds. Extremities: No cyanosis, clubbing, rash, 1-2+ pre-tibial BLE edema Neuro: Alert & oriented x 3, cranial nerves grossly intact. Moves all 4 extremities w/o difficulty. Affect pleasant.  ECG: AF 91 bpm, low voltage (personally reviewed)  ASSESSMENT & PLAN: 1. Chronic Diastolic HF with Prominent RV failure + PH  - Echo (5/22): EF 45-50%. RV severely reduced RVSP 55. D-shaped septum. Severe biatrial enlargement . Moderate pericardial effusion  - Echo (6/23): EF 55-60%, RV severely reduced RVSP 60. D-shaped septum  - She has  R>>L heart failure. Based on her echo suspect she likely has diastolic dysfunction with restrictive physiology with secondary pulmonary HTN and R-sided HF. OSA/OHS also likely a major contributor to RV dysfunction/PH as well. - Given associated atrial fib, low voltage QRS, CKD and polyneuropathy including carpal tunnel and spinal stenosis, concern for possible amyloid, however PYP scan 8/23 equivocal for cardiac amyloidosis and Multiple Myeloma Panel and SPEP negative. - TEE (8/23): EF 55%, markedly HK RV, severe TR, no endocarditis, moderate pericardial effusion/no tamponade.  - NYHA III-IIIb, functional status difficult due to physical deconditioning and body habitus. Volume status up. - Restart metolazone 2.5 mg + 40 KCL once a week (Thursdays), take a dose today. - Continue Lasix 80 mg bid. - Can consider bumetadine down the road if needed (allergic to torsemide). - No SGLT2i with frequent groin yeast infections -  No ARNi/MRA given CKD.  - Unable to place UNNA boots with DFU & Charcot foot. Refer to Dr. Sharol Given to help with management. - Ceeds CPAP. Awaiting new machine. Encouraged strict compliance once obtained  - Doubt CTEPH as she has been on Xarelto for Afib and reports full compliance  - Can consider RHC in future to further assess hemodynamics as needed  - BMET and BNP today, repeat  BMET in 1 week (given Rx for [facility] in [place])   2. Permanent AF - Rate controlled.  - Continue beta blocker. - Continue Xarelto. CBC today.   3. CKD IV - b/l SCr ~2.5  - Followed by Renal. Will forward labs to Dr. Joelyn Oms. - Likely not a candidate for dialysis - Not SGLT2i candidate as above due to yeast infections. - BMET today. Follow closely with addition of metolazone.  4. Upper extremity DVT - On Eliquis 5 mg bid. No bleeding issues. - CBC today.   5. H/o Bacteremia/Septic shock - TEE (8/23) without evidence of endocarditis - Recently say Dr. Baxter Flattery and completed abx.   6. Morbid obesity  - Body mass index is 44.98 kg/m. - Sedentary at baseline. Activity limited due to polyneuropathy, gait instability and Charcot foot. - Continue HH PT as able.   7. OSA - Moderate. Dr. Radford Pax following.  - Awaiting CPAP. I asked husband to call.   Follow up in 4 weeks with APP and 3-4 months with Dr. Haroldine Laws.  Allena Katz, FNP-BC 11/27/21

## 2021-11-26 NOTE — Telephone Encounter (Signed)
Called and spoke to patient's husband to confirm/remind patient of their appointment at the [facility] on 11/27/21.   Patient reminded to bring all medications and/or complete list.  Confirmed patient has transportation. Gave directions, instructed to utilize [facility] parking.  Confirmed appointment prior to ending call.

## 2021-11-27 ENCOUNTER — Encounter (HOSPITAL_COMMUNITY): Payer: Self-pay

## 2021-11-27 ENCOUNTER — Ambulatory Visit (HOSPITAL_COMMUNITY)
Admission: RE | Admit: 2021-11-27 | Discharge: 2021-11-27 | Disposition: A | Discharge status: Home / Self Care | Payer: Medicare HMO | Source: Ambulatory Visit | Attending: Family Medicine | Admitting: Family Medicine

## 2021-11-27 VITALS — BP 126/70 | HR 102 | Wt 304.6 lb

## 2021-11-27 DIAGNOSIS — I82629 Acute embolism and thrombosis of deep veins of unspecified upper extremity: Secondary | ICD-10-CM | POA: Diagnosis not present

## 2021-11-27 DIAGNOSIS — Z79899 Other long term (current) drug therapy: Secondary | ICD-10-CM | POA: Diagnosis not present

## 2021-11-27 DIAGNOSIS — Z6841 Body Mass Index (BMI) 40.0 and over, adult: Secondary | ICD-10-CM | POA: Insufficient documentation

## 2021-11-27 DIAGNOSIS — Z7901 Long term (current) use of anticoagulants: Secondary | ICD-10-CM | POA: Insufficient documentation

## 2021-11-27 DIAGNOSIS — E1122 Type 2 diabetes mellitus with diabetic chronic kidney disease: Secondary | ICD-10-CM | POA: Diagnosis not present

## 2021-11-27 DIAGNOSIS — I82721 Chronic embolism and thrombosis of deep veins of right upper extremity: Secondary | ICD-10-CM

## 2021-11-27 DIAGNOSIS — G4733 Obstructive sleep apnea (adult) (pediatric): Secondary | ICD-10-CM | POA: Insufficient documentation

## 2021-11-27 DIAGNOSIS — I272 Pulmonary hypertension, unspecified: Secondary | ICD-10-CM | POA: Diagnosis not present

## 2021-11-27 DIAGNOSIS — I130 Hypertensive heart and chronic kidney disease with heart failure and stage 1 through stage 4 chronic kidney disease, or unspecified chronic kidney disease: Secondary | ICD-10-CM | POA: Diagnosis present

## 2021-11-27 DIAGNOSIS — I3139 Other pericardial effusion (noninflammatory): Secondary | ICD-10-CM | POA: Diagnosis not present

## 2021-11-27 DIAGNOSIS — E1136 Type 2 diabetes mellitus with diabetic cataract: Secondary | ICD-10-CM | POA: Insufficient documentation

## 2021-11-27 DIAGNOSIS — I5032 Chronic diastolic (congestive) heart failure: Secondary | ICD-10-CM | POA: Diagnosis present

## 2021-11-27 DIAGNOSIS — R7881 Bacteremia: Secondary | ICD-10-CM | POA: Insufficient documentation

## 2021-11-27 DIAGNOSIS — N184 Chronic kidney disease, stage 4 (severe): Secondary | ICD-10-CM | POA: Diagnosis not present

## 2021-11-27 DIAGNOSIS — I4821 Permanent atrial fibrillation: Secondary | ICD-10-CM

## 2021-11-27 LAB — BASIC METABOLIC PANEL
Anion gap: 8 (ref 5–15)
BUN: 52 mg/dL — ABNORMAL HIGH (ref 8–23)
CO2: 28 mmol/L (ref 22–32)
Calcium: 9.2 mg/dL (ref 8.9–10.3)
Chloride: 98 mmol/L (ref 98–111)
Creatinine, Ser: 2.77 mg/dL — ABNORMAL HIGH (ref 0.44–1.00)
GFR, Estimated: 17 mL/min — ABNORMAL LOW (ref >60)
Glucose, Bld: 207 mg/dL — ABNORMAL HIGH (ref 70–99)
Potassium: 4.4 mmol/L (ref 3.5–5.1)
Sodium: 134 mmol/L — ABNORMAL LOW (ref 135–145)

## 2021-11-27 LAB — CBC
HCT: 34.8 % — ABNORMAL LOW (ref 36.0–46.0)
Hemoglobin: 10.6 g/dL — ABNORMAL LOW (ref 12.0–15.0)
MCH: 28 pg (ref 26.0–34.0)
MCHC: 30.5 g/dL (ref 30.0–36.0)
MCV: 92.1 fL (ref 80.0–100.0)
Platelets: 144 10*3/uL — ABNORMAL LOW (ref 150–400)
RBC: 3.78 MIL/uL — ABNORMAL LOW (ref 3.87–5.11)
RDW: 17.2 % — ABNORMAL HIGH (ref 11.5–15.5)
WBC: 10.4 10*3/uL (ref 4.0–10.5)
nRBC: 0.2 % (ref 0.0–0.2)

## 2021-11-27 LAB — BRAIN NATRIURETIC PEPTIDE: B Natriuretic Peptide: 432.3 pg/mL — ABNORMAL HIGH (ref 0.0–100.0)

## 2021-11-27 MED ORDER — METOLAZONE 2.5 MG PO TABS: 2.5000 mg | ORAL_TABLET | ORAL | 3 refills | Status: DC

## 2021-11-27 MED ORDER — POTASSIUM CHLORIDE CRYS ER 20 MEQ PO TBCR: 40.0000 meq | EXTENDED_RELEASE_TABLET | ORAL | 3 refills | Status: AC

## 2021-11-27 NOTE — Patient Instructions (Addendum)
Thank you for coming in today  Labs were done today, if any labs are abnormal the clinic will call you No news is good news  RESTART Metolazone 2.5 mg 1 tablet with 40 meq 2 tablets of Potassium once a week every Thursday [place] 11/27/2021  Your physician recommends that you schedule a follow-up appointment in:  4 weeks in clinic 3 months with Dr. Haroldine Laws  Your physician recommends that you return for lab work in: 1 week for BMET you have been given a prescription to give to [place] have been referred to Dr. Sharol Given orthopedics for your foot and they will contact you for further appointment details     Do the following things EVERYDAY: Weigh yourself in the morning before breakfast. Write it down and keep it in a log. Take your medicines as prescribed Eat low salt foods--Limit salt (sodium) to 2000 mg per day.  Stay as active as you can everyday Limit all fluids for the day to less than 2 liters  At the [facility], you and your health needs are our priority. As part of our continuing mission to provide you with exceptional heart care, we have created designated Provider Care Teams. These Care Teams include your primary Cardiologist (physician) and Advanced Practice Providers (APPs- Physician Assistants and Nurse Practitioners) who all work together to provide you with the care you need, when you need it.   You may see any of the following providers on your designated Care Team at your next follow up: Dr Glori Bickers Dr Loralie Champagne Dr. Roxana Hires, NP Lyda Jester, [place] [facility], [place] Forestine Na, NP Audry Riles, PharmD   Please be sure to bring in all your medications bottles to every appointment.   If you have any questions or concerns before your next appointment please send [place] a message through [place] or call our office at [phone].    TO LEAVE A MESSAGE FOR THE NURSE SELECT OPTION 2, PLEASE  LEAVE A MESSAGE INCLUDING: YOUR NAME DATE OF BIRTH CALL BACK NUMBER REASON FOR CALL**this is important as we prioritize the call backs  YOU WILL RECEIVE A CALL BACK THE SAME DAY AS LONG AS YOU CALL BEFORE 4:00 PM

## 2021-12-01 ENCOUNTER — Ambulatory Visit: Payer: Medicare HMO | Admitting: Orthopedic Surgery

## 2021-12-01 DIAGNOSIS — E11621 Type 2 diabetes mellitus with foot ulcer: Secondary | ICD-10-CM

## 2021-12-01 DIAGNOSIS — L97529 Non-pressure chronic ulcer of other part of left foot with unspecified severity: Secondary | ICD-10-CM

## 2021-12-01 DIAGNOSIS — I890 Lymphedema, not elsewhere classified: Secondary | ICD-10-CM | POA: Diagnosis not present

## 2021-12-01 DIAGNOSIS — M14672 Charcot's joint, left ankle and foot: Secondary | ICD-10-CM | POA: Diagnosis not present

## 2021-12-02 ENCOUNTER — Encounter: Payer: Self-pay | Admitting: Orthopedic Surgery

## 2021-12-02 NOTE — Progress Notes (Signed)
Office Visit Note   Patient: Kristine [name]           Date of Birth: 09-03-1945           MRN: [id number] Visit Date: 12/01/2021              Requested by: Rafael Bihari, FNP [address],  [place] [address] PCP: Prince Solian, MD  Chief Complaint  Patient presents with   Left Foot - Wound Check   Right Leg - Edema   Left Leg - Edema      HPI: Patient is a 76 year old woman who is seen for initial evaluation of her left lower extremity.  Patient has significant venous and lymphatic insufficiency of both lower extremities she has undergone surgical intervention for Charcot collapse of the left foot at [facility].  Surgery approximately 7 years ago.  Patient also has had surgery for hematoma left proximal tibia performed by plastic surgery.  Patient states she recently has been hospitalized for 15 days with sepsis with bilateral venous and lymphatic insufficiency of her lower extremities.  Patient states that the effusion of her left knee was drained by radiology.  Assessment & Plan: Visit Diagnoses:  1. Charcot's joint, left ankle and foot   2. Lymphedema     Plan: We will apply Dynaflex compression wraps to both lower extremities.  Patient will need lymphedema pumps to further treat the lymphedema chronically.  Ulcer debrided of the first metatarsal head left foot x1.  Follow-Up Instructions: Return in about 1 week (around 12/08/2021).   Ortho Exam  Patient is alert, oriented, no adenopathy, well-dressed, normal affect, normal respiratory effort. Examination patient has significant venous and lymphatic insufficiency without cellulitis.  Patient has a healing area of granulation tissue over the tibial tubercle from previous debridement.  MRI scan from August shows no evidence of osteomyelitis.  Patient has a Wagner grade 1 ulcer beneath the first metatarsal head.  After informed consent a 10 blade knife was used to debride the skin and soft tissue back to healthy  viable granulation tissue after debridement the ulcer is 2 cm in diameter 1 mm deep no exposed bone or tendon no tunneling.   the right ankle is 28 cm in circumference right calf 47 cm in circumference.  The left ankle is 29 cm in circumference left calf 47 cm.  Imaging: No results found. No images are attached to the encounter.  Labs: Lab Results  Component Value Date   HGBA1C 7.0 (H) 09/11/2021   HGBA1C 10.2 (H) 01/07/2021   HGBA1C 8.8 (H) 01/03/2019   ESRSEDRATE 21 03/24/2021   ESRSEDRATE 28 01/07/2021   ESRSEDRATE 14 07/29/2016   CRP 0.5 03/24/2021   CRP 15 (H) 01/07/2021   CRP 14.2 (H) 07/29/2016   REPTSTATUS 11/05/2021 FINAL 10/31/2021   GRAMSTAIN  10/31/2021    NO ORGANISMS SEEN MODERATE WBC PRESENT, PREDOMINANTLY PMN    CULT  10/31/2021    No growth aerobically or anaerobically. Performed at [facility], [place] [address] [place], [place] [address]    LABORGA GROUP B STREP(S.AGALACTIAE)ISOLATED 10/28/2021     Lab Results  Component Value Date   ALBUMIN 2.9 (L) 10/29/2021   ALBUMIN 4.0 10/28/2021   ALBUMIN 3.9 10/19/2021   PREALBUMIN 7.8 (L) 09/12/2021    Lab Results  Component Value Date   MG 2.6 (H) 11/18/2021   MG 2.3 10/29/2021   MG 2.4 10/28/2021   Lab Results  Component Value Date  VD25OH 90.5 01/07/2021   VD25OH 47.5 07/29/2016    Lab Results  Component Value Date   PREALBUMIN 7.8 (L) 09/12/2021      Latest Ref Rng & Units 11/27/2021    2:52 PM 11/18/2021    3:07 PM 11/07/2021    4:40 AM  CBC EXTENDED  WBC 4.0 - 10.5 K/uL 10.4  7.5  9.8   RBC 3.87 - 5.11 MIL/uL 3.78  3.74  3.47   Hemoglobin 12.0 - 15.0 g/dL 10.6  10.5  9.9   HCT 36.0 - 46.0 % 34.8  33.9  31.1   Platelets 150 - 400 K/uL 144  151  108   NEUT# 1,500 - 7,800 cells/uL  5,280    Lymph# 850 - 3,900 cells/uL  1,290       There is no height or weight on file to calculate BMI.  Orders:  No orders of the defined types were placed in this encounter.  No orders of the  defined types were placed in this encounter.    Procedures: No procedures performed  Clinical Data: No additional findings.  ROS:  All other systems negative, except as noted in the HPI. Review of Systems  Objective: Vital Signs: There were no vitals taken for this visit.  Specialty Comments:  No specialty comments available.  PMFS History: Patient Active Problem List   Diagnosis Date Noted   Atrial fibrillation with rapid ventricular response ([place])    Bacteremia    Cellulitis    Pressure injury of skin 10/29/2021   Septic shock ([place]) 10/28/2021   Acute pulmonary edema (HCC)    Acute right-sided heart failure (HCC)    CHF (congestive heart failure) ([place]) 09/11/2021   Hypokalemia 09/11/2021   Hematoma of left knee region 05/14/2021   DM type 2 with diabetic peripheral neuropathy ([place]) 01/07/2021   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 ([place]) 08/31/2019   Moderate nonproliferative diabetic retinopathy of right eye ([place]) 08/31/2019   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   Chronic combined systolic and diastolic CHF (congestive heart failure) ([place]) 06/29/2019   Gout 01/03/2019   Pulmonary hypertension ([place]) 01/03/2019   Essential hypertension 01/03/2019   Candidiasis, intertriginous 01/03/2019   CKD (chronic kidney disease) stage 4, GFR 15-29 ml/min (HCC) 06/03/2018   Gait abnormality 02/09/2017   Obstructive sleep apnea 02/09/2017   Diabetic peripheral neuropathy ([place]) 09/02/2016   Peripheral neuropathy 07/29/2016   Spinal stenosis of lumbar region 07/29/2016   Excessive daytime sleepiness 07/29/2016   Morbid obesity ([place]) 07/29/2016   Anemia 05/31/2015   Acute on chronic combined systolic and diastolic CHF (congestive heart failure) ([place]) 05/31/2015   Paroxysmal atrial fibrillation ([place])    Obesity 38/25/0539   Diastolic dysfunction with chronic heart failure ([place]) 06/23/2013   Persistent atrial fibrillation ([place]) 06/20/2010   Pneumonia     Abnormal cardiovascular function study    High cholesterol    Scoliosis    Spinal stenosis    Anxiety    Osteoarthritis    Osteoporosis    Mild aortic sclerosis    Degenerative disc disease    Vitamin D deficiency    HEMOPTYSIS UNSPECIFIED 05/12/2010   Diabetes mellitus, type 2 ([place]) 11/19/2009   Hypertensive cardiovascular disease 11/19/2009   Dyspnea 11/19/2009   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   Anxiety    Arthritis    Asthma    Atrial fibrillation ([place])    CAP (community acquired pneumonia) 05/31/2015  Cataract    CHF (congestive heart failure) (HCC)    Chronic bronchitis (HCC)    Chronic kidney disease    Clotting disorder ([place])    Community acquired pneumonia 05/31/2015   Depression    Dysrhythmia    GERD (gastroesophageal reflux disease)    High cholesterol    History of hiatal hernia    HTN (hypertension)    Mild aortic sclerosis    Morbid obesity (HCC)    Neuromuscular disorder (HCC)    Neuropathic pain    Osteoporosis    Persistent atrial fibrillation ([place])    Scoliosis    Sleep apnea    Spinal stenosis    Thyroid disease    Type II diabetes mellitus ([place])    Vitamin D deficiency     Family History  Problem Relation Age of Onset   Stroke Father    Hypertension Father    Atrial fibrillation Father        HAD MURMUR   Heart failure Mother    Congestive Heart Failure Mother    Hypertension Mother    Hypertension Brother    Hypertension Sister    Hypertension Son    CAD Sister        EARLY   CAD Brother        EARLY   Colon cancer Neg Hx    Esophageal cancer Neg Hx    Stomach cancer Neg Hx    Rectal cancer Neg Hx     Past Surgical History:  Procedure Laterality Date   BLADDER SUSPENSION  1980s   CARDIAC CATHETERIZATION  11/16/09   SMOOTH AND NORMAL   CARDIOVERSION N/A 01/07/2015   Procedure: CARDIOVERSION;  Surgeon: Skeet Latch, MD;  Location: [facility];  Service: Cardiovascular;   Laterality: N/A;   CARDIOVERSION N/A 03/11/2015   Procedure: CARDIOVERSION;  Surgeon: Skeet Latch, MD;  Location: [facility];  Service: Cardiovascular;  Laterality: N/A;   CARPAL TUNNEL RELEASE Left 1970s   CATARACT EXTRACTION W/ INTRAOCULAR LENS  IMPLANT, BILATERAL Bilateral ~ 2010   COLONOSCOPY  08/14/08   FINGER FRACTURE SURGERY Right 1970s   "ring finger"   FRACTURE SURGERY     HEMATOMA EVACUATION Left 05/21/2021   Procedure: EVACUATION LEFT KNEE HEMATOMA;  Surgeon: Lennice Sites, MD;  Location: [facility];  Service: Plastics;  Laterality: Left;   IR US GUIDE BX ASP/DRAIN  10/31/2021   LAPAROSCOPIC CHOLECYSTECTOMY  1980s   TEE WITHOUT CARDIOVERSION N/A 11/03/2021   Procedure: TRANSESOPHAGEAL ECHOCARDIOGRAM (TEE);  Surgeon: Jolaine Artist, MD;  Location[facility];  Service: Cardiovascular;  Laterality: N/A;   TUBAL LIGATION     VAGINAL HYSTERECTOMY  1980   Social History   Occupational History   Occupation: Retired  Tobacco Use   Smoking status: Never   Smokeless tobacco: Never  Vaping Use   Vaping Use: Never used  Substance and Sexual Activity   Alcohol use: No   Drug use: No   Sexual activity: Yes

## 2021-12-04 ENCOUNTER — Ambulatory Visit: Payer: Medicare HMO | Admitting: Orthopedic Surgery

## 2021-12-04 ENCOUNTER — Encounter: Payer: Self-pay | Admitting: Orthopedic Surgery

## 2021-12-04 DIAGNOSIS — I890 Lymphedema, not elsewhere classified: Secondary | ICD-10-CM | POA: Diagnosis not present

## 2021-12-04 NOTE — Progress Notes (Signed)
Office Visit Note   Patient: Kristine [name]           Date of Birth: [birth year]           MRN: [id number] Visit Date: 12/04/2021              Requested by: Prince Solian, MD [address] [place],  [place] [address] PCP: Prince Solian, MD  Chief Complaint  Patient presents with   Right Leg - Follow-up   Left Leg - Follow-up      HPI: Patient is a 76 year old woman with venous and lymphatic insufficiency both lower extremities with ulcers on both legs.  Patient was in compression wraps which have significantly decreased the swelling however the wraps have been pushed down and she still has swelling proximally.  Assessment & Plan: Visit Diagnoses:  1. Lymphedema     Plan: Patient was provided a prescription for a size extra-large 15-20 compression knee-high sock at [facility] discount medical.  Follow-Up Instructions: Return in about 4 weeks (around 01/01/2022).   Ortho Exam  Patient is alert, oriented, no adenopathy, well-dressed, normal affect, normal respiratory effort. Examination patient has brawny edema of both legs there is now good wrinkling of the skin halfway up her calf.  The proximal ulcers are improving there is no cellulitis odor or drainage no signs of infection.  Her calf measures 46 cm in circumference bilaterally.  Imaging: No results found. No images are attached to the encounter.  Labs: Lab Results  Component Value Date   HGBA1C 7.0 (H) 09/11/2021   HGBA1C 10.2 (H) 01/07/2021   HGBA1C 8.8 (H) 01/03/2019   ESRSEDRATE 21 03/24/2021   ESRSEDRATE 28 01/07/2021   ESRSEDRATE 14 07/29/2016   CRP 0.5 03/24/2021   CRP 15 (H) 01/07/2021   CRP 14.2 (H) 07/29/2016   REPTSTATUS 11/05/2021 FINAL 10/31/2021   GRAMSTAIN  10/31/2021    NO ORGANISMS SEEN MODERATE WBC PRESENT, PREDOMINANTLY PMN    CULT  10/31/2021    No growth aerobically or anaerobically. Performed at [facility], [place] [address], [place], [place] [address]    LABORGA  GROUP B STREP(S.AGALACTIAE)ISOLATED 10/28/2021     Lab Results  Component Value Date   ALBUMIN 2.9 (L) 10/29/2021   ALBUMIN 4.0 10/28/2021   ALBUMIN 3.9 10/19/2021   PREALBUMIN 7.8 (L) 09/12/2021    Lab Results  Component Value Date   MG 2.6 (H) 11/18/2021   MG 2.3 10/29/2021   MG 2.4 10/28/2021   Lab Results  Component Value Date   VD25OH 90.5 01/07/2021   VD25OH 47.5 07/29/2016    Lab Results  Component Value Date   PREALBUMIN 7.8 (L) 09/12/2021      Latest Ref Rng & Units 11/27/2021    2:52 PM 11/18/2021    3:07 PM 11/07/2021    4:40 AM  CBC EXTENDED  WBC 4.0 - 10.5 K/uL 10.4  7.5  9.8   RBC 3.87 - 5.11 MIL/uL 3.78  3.74  3.47   Hemoglobin 12.0 - 15.0 g/dL 10.6  10.5  9.9   HCT 36.0 - 46.0 % 34.8  33.9  31.1   Platelets 150 - 400 K/uL 144  151  108   NEUT# 1,500 - 7,800 cells/uL  5,280    Lymph# 850 - 3,900 cells/uL  1,290       There is no height or weight on file to calculate BMI.  Orders:  No orders of the defined types were placed in this encounter.  No  orders of the defined types were placed in this encounter.    Procedures: No procedures performed  Clinical Data: No additional findings.  ROS:  All other systems negative, except as noted in the HPI. Review of Systems  Objective: Vital Signs: There were no vitals taken for this visit.  Specialty Comments:  No specialty comments available.  PMFS History: Patient Active Problem List   Diagnosis Date Noted   Atrial fibrillation with rapid ventricular response ([place])    Bacteremia    Cellulitis    Pressure injury of skin 10/29/2021   Septic shock ([place]) 10/28/2021   Acute pulmonary edema (HCC)    Acute right-sided heart failure (HCC)    CHF (congestive heart failure) ([place]) 09/11/2021   Hypokalemia 09/11/2021   Hematoma of left knee region 05/14/2021   DM type 2 with diabetic peripheral neuropathy ([place]) 01/07/2021   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 ([place])  08/31/2019   Moderate nonproliferative diabetic retinopathy of right eye ([place]) 08/31/2019   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   Chronic combined systolic and diastolic CHF (congestive heart failure) ([place]) 06/29/2019   Gout 01/03/2019   Pulmonary hypertension ([place]) 01/03/2019   Essential hypertension 01/03/2019   Candidiasis, intertriginous 01/03/2019   CKD (chronic kidney disease) stage 4, GFR 15-29 ml/min (HCC) 06/03/2018   Gait abnormality 02/09/2017   Obstructive sleep apnea 02/09/2017   Diabetic peripheral neuropathy ([place]) 09/02/2016   Peripheral neuropathy 07/29/2016   Spinal stenosis of lumbar region 07/29/2016   Excessive daytime sleepiness 07/29/2016   Morbid obesity ([place]) 07/29/2016   Anemia 05/31/2015   Acute on chronic combined systolic and diastolic CHF (congestive heart failure) ([place]) 05/31/2015   Paroxysmal atrial fibrillation ([name])    Obesity 85/46/2703   Diastolic dysfunction with chronic heart failure ([place]) 06/23/2013   Persistent atrial fibrillation ([place]) 06/20/2010   Pneumonia    Abnormal cardiovascular function study    High cholesterol    Scoliosis    Spinal stenosis    Anxiety    Osteoarthritis    Osteoporosis    Mild aortic sclerosis    Degenerative disc disease    Vitamin D deficiency    HEMOPTYSIS UNSPECIFIED 05/12/2010   Diabetes mellitus, type 2 ([place]) 11/19/2009   Hypertensive cardiovascular disease 11/19/2009   Dyspnea 11/19/2009   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   Anxiety    Arthritis    Asthma    Atrial fibrillation ([place])    CAP (community acquired pneumonia) 05/31/2015   Cataract    CHF (congestive heart failure) ([place])    Chronic bronchitis ([place])    Chronic kidney disease    Clotting disorder ([place])    Community acquired pneumonia 05/31/2015   Depression    Dysrhythmia    GERD (gastroesophageal reflux disease)    High cholesterol    History of hiatal hernia    HTN (hypertension)     Mild aortic sclerosis    Morbid obesity (HCC)    Neuromuscular disorder (HCC)    Neuropathic pain    Osteoporosis    Persistent atrial fibrillation ([place])    Scoliosis    Sleep apnea    Spinal stenosis    Thyroid disease    Type II diabetes mellitus ([place])    Vitamin D deficiency     Family History  Problem Relation Age of Onset   Stroke Father    Hypertension Father    Atrial fibrillation Father  HAD MURMUR   Heart failure Mother    Congestive Heart Failure Mother    Hypertension Mother    Hypertension Brother    Hypertension Sister    Hypertension Son    CAD Sister        EARLY   CAD Brother        EARLY   Colon cancer Neg Hx    Esophageal cancer Neg Hx    Stomach cancer Neg Hx    Rectal cancer Neg Hx     Past Surgical History:  Procedure Laterality Date   BLADDER SUSPENSION  1980s   CARDIAC CATHETERIZATION  11/16/09   SMOOTH AND NORMAL   CARDIOVERSION N/A 01/07/2015   Procedure: CARDIOVERSION;  Surgeon: Skeet Latch, MD;  Location: [facility];  Service: Cardiovascular;  Laterality: N/A;   CARDIOVERSION N/A 03/11/2015   Procedure: CARDIOVERSION;  Surgeon: Skeet Latch, MD;  Location: [facility];  Service: Cardiovascular;  Laterality: N/A;   CARPAL TUNNEL RELEASE Left 1970s   CATARACT EXTRACTION W/ INTRAOCULAR LENS  IMPLANT, BILATERAL Bilateral ~ 2010   COLONOSCOPY  08/14/08   FINGER FRACTURE SURGERY Right 1970s   "ring finger"   FRACTURE SURGERY     HEMATOMA EVACUATION Left 05/21/2021   Procedure: EVACUATION LEFT KNEE HEMATOMA;  Surgeon: Lennice Sites, MD;  Location: [facility];  Service: Plastics;  Laterality: Left;   IR US GUIDE BX ASP/DRAIN  10/31/2021   LAPAROSCOPIC CHOLECYSTECTOMY  1980s   TEE WITHOUT CARDIOVERSION N/A 11/03/2021   Procedure: TRANSESOPHAGEAL ECHOCARDIOGRAM (TEE);  Surgeon: Jolaine Artist, MD;  Location: [facility] ENDOSCOPY;  Service: Cardiovascular;  Laterality: N/A;   TUBAL LIGATION     VAGINAL HYSTERECTOMY  1980   Social History    Occupational History   Occupation: Retired  Tobacco Use   Smoking status: Never   Smokeless tobacco: Never  Vaping Use   Vaping Use: Never used  Substance and Sexual Activity   Alcohol use: No   Drug use: No   Sexual activity: Yes

## 2021-12-08 ENCOUNTER — Ambulatory Visit: Payer: Medicare HMO | Admitting: Orthopedic Surgery

## 2021-12-10 ENCOUNTER — Other Ambulatory Visit (HOSPITAL_COMMUNITY): Payer: Self-pay | Admitting: Family Medicine

## 2021-12-11 ENCOUNTER — Telehealth (HOSPITAL_COMMUNITY): Payer: Self-pay | Admitting: Cardiology

## 2021-12-11 ENCOUNTER — Other Ambulatory Visit (HOSPITAL_COMMUNITY): Payer: Self-pay | Admitting: Cardiology

## 2021-12-11 ENCOUNTER — Other Ambulatory Visit (HOSPITAL_COMMUNITY): Payer: Self-pay

## 2021-12-11 DIAGNOSIS — I5042 Chronic combined systolic (congestive) and diastolic (congestive) heart failure: Secondary | ICD-10-CM

## 2021-12-11 LAB — BASIC METABOLIC PANEL
BUN/Creatinine Ratio: 30 — ABNORMAL HIGH (ref 12–28)
BUN: 66 mg/dL — ABNORMAL HIGH (ref 8–27)
CO2: 26 mmol/L (ref 20–29)
Calcium: 9.6 mg/dL (ref 8.7–10.3)
Chloride: 93 mmol/L — ABNORMAL LOW (ref 96–106)
Creatinine, Ser: 2.23 mg/dL — ABNORMAL HIGH (ref 0.57–1.00)
Glucose: 130 mg/dL — ABNORMAL HIGH (ref 70–99)
Potassium: 2.9 mmol/L — ABNORMAL LOW (ref 3.5–5.2)
Sodium: 136 mmol/L (ref 134–144)
eGFR: 23 mL/min/{1.73_m2} — ABNORMAL LOW (ref >59)

## 2021-12-11 LAB — SPECIMEN STATUS REPORT

## 2021-12-11 NOTE — Telephone Encounter (Signed)
Pt aware and voiced understanding  Repeat labs 9/22 Order placed

## 2021-12-11 NOTE — Telephone Encounter (Signed)
Abnormal labs received from fax Labs drawn 12/10/21  K 2.9 BUN 66 Cr 2.23  Patient reports she did not take 40 meq with metolazone once a week as ordered. She only took one tab (20 meq)    Per Marlyce Huge, PA Take 60 meq today and 40 meq with metolazone (today is a metolazone day) Recheck K tomorrow    [facility] for patient

## 2021-12-13 ENCOUNTER — Emergency Department (HOSPITAL_BASED_OUTPATIENT_CLINIC_OR_DEPARTMENT_OTHER): Payer: Medicare HMO

## 2021-12-13 ENCOUNTER — Encounter (HOSPITAL_COMMUNITY): Payer: Self-pay

## 2021-12-13 ENCOUNTER — Other Ambulatory Visit: Payer: Self-pay

## 2021-12-13 ENCOUNTER — Inpatient Hospital Stay (HOSPITAL_BASED_OUTPATIENT_CLINIC_OR_DEPARTMENT_OTHER)
Admission: EM | Admit: 2021-12-13 | Discharge: 2021-12-21 | DRG: 981 | Disposition: A | Discharge status: Home Health | Payer: Medicare HMO | Attending: Internal Medicine | Admitting: Internal Medicine

## 2021-12-13 ENCOUNTER — Encounter (HOSPITAL_BASED_OUTPATIENT_CLINIC_OR_DEPARTMENT_OTHER): Payer: Self-pay | Admitting: Emergency Medicine

## 2021-12-13 DIAGNOSIS — I272 Pulmonary hypertension, unspecified: Secondary | ICD-10-CM | POA: Diagnosis present

## 2021-12-13 DIAGNOSIS — R601 Generalized edema: Principal | ICD-10-CM

## 2021-12-13 DIAGNOSIS — S51811A Laceration without foreign body of right forearm, initial encounter: Secondary | ICD-10-CM | POA: Diagnosis not present

## 2021-12-13 DIAGNOSIS — E039 Hypothyroidism, unspecified: Secondary | ICD-10-CM | POA: Diagnosis present

## 2021-12-13 DIAGNOSIS — Z880 Allergy status to penicillin: Secondary | ICD-10-CM

## 2021-12-13 DIAGNOSIS — I2729 Other secondary pulmonary hypertension: Secondary | ICD-10-CM | POA: Diagnosis present

## 2021-12-13 DIAGNOSIS — Z888 Allergy status to other drugs, medicaments and biological substances status: Secondary | ICD-10-CM

## 2021-12-13 DIAGNOSIS — Z79899 Other long term (current) drug therapy: Secondary | ICD-10-CM

## 2021-12-13 DIAGNOSIS — I5042 Chronic combined systolic (congestive) and diastolic (congestive) heart failure: Secondary | ICD-10-CM | POA: Diagnosis present

## 2021-12-13 DIAGNOSIS — Z7901 Long term (current) use of anticoagulants: Secondary | ICD-10-CM

## 2021-12-13 DIAGNOSIS — E876 Hypokalemia: Secondary | ICD-10-CM | POA: Diagnosis present

## 2021-12-13 DIAGNOSIS — E7800 Pure hypercholesterolemia, unspecified: Secondary | ICD-10-CM | POA: Diagnosis present

## 2021-12-13 DIAGNOSIS — W19XXXA Unspecified fall, initial encounter: Secondary | ICD-10-CM | POA: Diagnosis present

## 2021-12-13 DIAGNOSIS — N83201 Unspecified ovarian cyst, right side: Secondary | ICD-10-CM | POA: Diagnosis present

## 2021-12-13 DIAGNOSIS — Z515 Encounter for palliative care: Secondary | ICD-10-CM

## 2021-12-13 DIAGNOSIS — M419 Scoliosis, unspecified: Secondary | ICD-10-CM | POA: Diagnosis present

## 2021-12-13 DIAGNOSIS — I2781 Cor pulmonale (chronic): Secondary | ICD-10-CM | POA: Diagnosis present

## 2021-12-13 DIAGNOSIS — N3001 Acute cystitis with hematuria: Secondary | ICD-10-CM

## 2021-12-13 DIAGNOSIS — Y92009 Unspecified place in unspecified non-institutional (private) residence as the place of occurrence of the external cause: Secondary | ICD-10-CM

## 2021-12-13 DIAGNOSIS — D689 Coagulation defect, unspecified: Secondary | ICD-10-CM | POA: Diagnosis present

## 2021-12-13 DIAGNOSIS — M109 Gout, unspecified: Secondary | ICD-10-CM | POA: Diagnosis present

## 2021-12-13 DIAGNOSIS — D649 Anemia, unspecified: Secondary | ICD-10-CM | POA: Diagnosis present

## 2021-12-13 DIAGNOSIS — M810 Age-related osteoporosis without current pathological fracture: Secondary | ICD-10-CM | POA: Diagnosis present

## 2021-12-13 DIAGNOSIS — E785 Hyperlipidemia, unspecified: Secondary | ICD-10-CM | POA: Diagnosis present

## 2021-12-13 DIAGNOSIS — R630 Anorexia: Secondary | ICD-10-CM | POA: Diagnosis present

## 2021-12-13 DIAGNOSIS — S41112A Laceration without foreign body of left upper arm, initial encounter: Secondary | ICD-10-CM

## 2021-12-13 DIAGNOSIS — I130 Hypertensive heart and chronic kidney disease with heart failure and stage 1 through stage 4 chronic kidney disease, or unspecified chronic kidney disease: Secondary | ICD-10-CM | POA: Diagnosis present

## 2021-12-13 DIAGNOSIS — F419 Anxiety disorder, unspecified: Secondary | ICD-10-CM | POA: Diagnosis present

## 2021-12-13 DIAGNOSIS — R296 Repeated falls: Secondary | ICD-10-CM | POA: Diagnosis present

## 2021-12-13 DIAGNOSIS — Z6841 Body Mass Index (BMI) 40.0 and over, adult: Secondary | ICD-10-CM

## 2021-12-13 DIAGNOSIS — Z9842 Cataract extraction status, left eye: Secondary | ICD-10-CM

## 2021-12-13 DIAGNOSIS — Z91048 Other nonmedicinal substance allergy status: Secondary | ICD-10-CM

## 2021-12-13 DIAGNOSIS — I3139 Other pericardial effusion (noninflammatory): Secondary | ICD-10-CM | POA: Diagnosis present

## 2021-12-13 DIAGNOSIS — B952 Enterococcus as the cause of diseases classified elsewhere: Secondary | ICD-10-CM | POA: Diagnosis present

## 2021-12-13 DIAGNOSIS — Z7401 Bed confinement status: Secondary | ICD-10-CM

## 2021-12-13 DIAGNOSIS — S8002XA Contusion of left knee, initial encounter: Secondary | ICD-10-CM | POA: Diagnosis present

## 2021-12-13 DIAGNOSIS — N390 Urinary tract infection, site not specified: Secondary | ICD-10-CM | POA: Diagnosis present

## 2021-12-13 DIAGNOSIS — Z86718 Personal history of other venous thrombosis and embolism: Secondary | ICD-10-CM

## 2021-12-13 DIAGNOSIS — R319 Hematuria, unspecified: Secondary | ICD-10-CM | POA: Diagnosis present

## 2021-12-13 DIAGNOSIS — R1909 Other intra-abdominal and pelvic swelling, mass and lump: Secondary | ICD-10-CM | POA: Diagnosis present

## 2021-12-13 DIAGNOSIS — N9489 Other specified conditions associated with female genital organs and menstrual cycle: Secondary | ICD-10-CM

## 2021-12-13 DIAGNOSIS — I4819 Other persistent atrial fibrillation: Secondary | ICD-10-CM | POA: Diagnosis present

## 2021-12-13 DIAGNOSIS — Z794 Long term (current) use of insulin: Secondary | ICD-10-CM

## 2021-12-13 DIAGNOSIS — Z66 Do not resuscitate: Secondary | ICD-10-CM | POA: Diagnosis present

## 2021-12-13 DIAGNOSIS — K219 Gastro-esophageal reflux disease without esophagitis: Secondary | ICD-10-CM | POA: Diagnosis present

## 2021-12-13 DIAGNOSIS — N184 Chronic kidney disease, stage 4 (severe): Secondary | ICD-10-CM | POA: Diagnosis present

## 2021-12-13 DIAGNOSIS — I5082 Biventricular heart failure: Secondary | ICD-10-CM | POA: Diagnosis present

## 2021-12-13 DIAGNOSIS — W06XXXA Fall from bed, initial encounter: Secondary | ICD-10-CM | POA: Diagnosis present

## 2021-12-13 DIAGNOSIS — E1142 Type 2 diabetes mellitus with diabetic polyneuropathy: Secondary | ICD-10-CM | POA: Diagnosis present

## 2021-12-13 DIAGNOSIS — Z823 Family history of stroke: Secondary | ICD-10-CM

## 2021-12-13 DIAGNOSIS — E559 Vitamin D deficiency, unspecified: Secondary | ICD-10-CM | POA: Diagnosis present

## 2021-12-13 DIAGNOSIS — I69354 Hemiplegia and hemiparesis following cerebral infarction affecting left non-dominant side: Secondary | ICD-10-CM

## 2021-12-13 DIAGNOSIS — Z9841 Cataract extraction status, right eye: Secondary | ICD-10-CM

## 2021-12-13 DIAGNOSIS — I10 Essential (primary) hypertension: Secondary | ICD-10-CM | POA: Diagnosis present

## 2021-12-13 DIAGNOSIS — Z8249 Family history of ischemic heart disease and other diseases of the circulatory system: Secondary | ICD-10-CM

## 2021-12-13 DIAGNOSIS — E162 Hypoglycemia, unspecified: Secondary | ICD-10-CM

## 2021-12-13 DIAGNOSIS — I4821 Permanent atrial fibrillation: Secondary | ICD-10-CM | POA: Diagnosis present

## 2021-12-13 DIAGNOSIS — Z961 Presence of intraocular lens: Secondary | ICD-10-CM | POA: Diagnosis present

## 2021-12-13 DIAGNOSIS — E1165 Type 2 diabetes mellitus with hyperglycemia: Secondary | ICD-10-CM | POA: Diagnosis present

## 2021-12-13 DIAGNOSIS — E11649 Type 2 diabetes mellitus with hypoglycemia without coma: Secondary | ICD-10-CM | POA: Diagnosis present

## 2021-12-13 DIAGNOSIS — Z91030 Bee allergy status: Secondary | ICD-10-CM

## 2021-12-13 DIAGNOSIS — I5033 Acute on chronic diastolic (congestive) heart failure: Secondary | ICD-10-CM | POA: Diagnosis present

## 2021-12-13 DIAGNOSIS — Z79890 Hormone replacement therapy (postmenopausal): Secondary | ICD-10-CM

## 2021-12-13 DIAGNOSIS — E66813 Obesity, class 3: Secondary | ICD-10-CM | POA: Diagnosis present

## 2021-12-13 DIAGNOSIS — G4733 Obstructive sleep apnea (adult) (pediatric): Secondary | ICD-10-CM | POA: Diagnosis present

## 2021-12-13 DIAGNOSIS — F32A Depression, unspecified: Secondary | ICD-10-CM | POA: Diagnosis present

## 2021-12-13 DIAGNOSIS — E119 Type 2 diabetes mellitus without complications: Secondary | ICD-10-CM

## 2021-12-13 DIAGNOSIS — E1122 Type 2 diabetes mellitus with diabetic chronic kidney disease: Secondary | ICD-10-CM | POA: Diagnosis present

## 2021-12-13 DIAGNOSIS — J4489 Other specified chronic obstructive pulmonary disease: Secondary | ICD-10-CM | POA: Diagnosis present

## 2021-12-13 DIAGNOSIS — Z91041 Radiographic dye allergy status: Secondary | ICD-10-CM

## 2021-12-13 DIAGNOSIS — I700 Atherosclerosis of aorta: Secondary | ICD-10-CM | POA: Diagnosis present

## 2021-12-13 HISTORY — DX: Permanent atrial fibrillation: I48.21

## 2021-12-13 HISTORY — DX: Chronic diastolic (congestive) heart failure: I50.32

## 2021-12-13 HISTORY — DX: Cor pulmonale (chronic): I27.81

## 2021-12-13 HISTORY — DX: Chronic kidney disease, stage 4 (severe): N18.4

## 2021-12-13 LAB — CBC WITH DIFFERENTIAL/PLATELET
Abs Immature Granulocytes: 0.06 10*3/uL (ref 0.00–0.07)
Basophils Absolute: 0.1 10*3/uL (ref 0.0–0.1)
Basophils Relative: 1 %
Eosinophils Absolute: 0.3 10*3/uL (ref 0.0–0.5)
Eosinophils Relative: 3 %
HCT: 35.9 % — ABNORMAL LOW (ref 36.0–46.0)
Hemoglobin: 11 g/dL — ABNORMAL LOW (ref 12.0–15.0)
Immature Granulocytes: 1 %
Lymphocytes Relative: 13 %
Lymphs Abs: 1.5 10*3/uL (ref 0.7–4.0)
MCH: 27.3 pg (ref 26.0–34.0)
MCHC: 30.6 g/dL (ref 30.0–36.0)
MCV: 89.1 fL (ref 80.0–100.0)
Monocytes Absolute: 0.7 10*3/uL (ref 0.1–1.0)
Monocytes Relative: 6 %
Neutro Abs: 8.9 10*3/uL — ABNORMAL HIGH (ref 1.7–7.7)
Neutrophils Relative %: 76 %
Platelets: 159 10*3/uL (ref 150–400)
RBC: 4.03 MIL/uL (ref 3.87–5.11)
RDW: 18.3 % — ABNORMAL HIGH (ref 11.5–15.5)
WBC: 11.5 10*3/uL — ABNORMAL HIGH (ref 4.0–10.5)
nRBC: 0 % (ref 0.0–0.2)

## 2021-12-13 LAB — COMPREHENSIVE METABOLIC PANEL
ALT: 9 U/L (ref 0–44)
AST: 20 U/L (ref 15–41)
Albumin: 3.8 g/dL (ref 3.5–5.0)
Alkaline Phosphatase: 91 U/L (ref 38–126)
Anion gap: 10 (ref 5–15)
BUN: 66 mg/dL — ABNORMAL HIGH (ref 8–23)
CO2: 30 mmol/L (ref 22–32)
Calcium: 9.6 mg/dL (ref 8.9–10.3)
Chloride: 97 mmol/L — ABNORMAL LOW (ref 98–111)
Creatinine, Ser: 2.35 mg/dL — ABNORMAL HIGH (ref 0.44–1.00)
GFR, Estimated: 21 mL/min — ABNORMAL LOW (ref >60)
Glucose, Bld: 52 mg/dL — ABNORMAL LOW (ref 70–99)
Potassium: 2.9 mmol/L — ABNORMAL LOW (ref 3.5–5.1)
Sodium: 137 mmol/L (ref 135–145)
Total Bilirubin: 1.6 mg/dL — ABNORMAL HIGH (ref 0.3–1.2)
Total Protein: 7.1 g/dL (ref 6.5–8.1)

## 2021-12-13 LAB — CBG MONITORING, ED
Glucose-Capillary: 126 mg/dL — ABNORMAL HIGH (ref 70–99)
Glucose-Capillary: 38 mg/dL — CL (ref 70–99)
Glucose-Capillary: 77 mg/dL (ref 70–99)
Glucose-Capillary: 87 mg/dL (ref 70–99)

## 2021-12-13 LAB — BASIC METABOLIC PANEL
BUN/Creatinine Ratio: 29 — ABNORMAL HIGH (ref 12–28)
BUN: 59 mg/dL — ABNORMAL HIGH (ref 8–27)
CO2: 23 mmol/L (ref 20–29)
Calcium: 9.5 mg/dL (ref 8.7–10.3)
Chloride: 95 mmol/L — ABNORMAL LOW (ref 96–106)
Creatinine, Ser: 2.06 mg/dL — ABNORMAL HIGH (ref 0.57–1.00)
Glucose: 140 mg/dL — ABNORMAL HIGH (ref 70–99)
Potassium: 4.2 mmol/L (ref 3.5–5.2)
Sodium: 141 mmol/L (ref 134–144)
eGFR: 25 mL/min/{1.73_m2} — ABNORMAL LOW (ref >59)

## 2021-12-13 LAB — URINALYSIS, ROUTINE W REFLEX MICROSCOPIC

## 2021-12-13 LAB — URINALYSIS, MICROSCOPIC (REFLEX)
RBC / HPF: >50 RBC/hpf (ref 0–5)
WBC, UA: >50 WBC/hpf (ref 0–5)

## 2021-12-13 LAB — MAGNESIUM: Magnesium: 2.4 mg/dL (ref 1.7–2.4)

## 2021-12-13 MED ORDER — SODIUM CHLORIDE 0.9 % IV SOLN
INTRAVENOUS | Status: DC | PRN
  Administered 2021-12-13: 100 mL via INTRAVENOUS

## 2021-12-13 MED ORDER — DEXTROSE 50 % IV SOLN
INTRAVENOUS | Status: AC
  Filled 2021-12-13: qty 50

## 2021-12-13 MED ORDER — POTASSIUM CHLORIDE 10 MEQ/100ML IV SOLN
10.0000 meq | INTRAVENOUS | Status: AC
  Administered 2021-12-13 (×3): 10 meq via INTRAVENOUS
  Filled 2021-12-13 (×2): qty 100

## 2021-12-13 MED ORDER — CEFTRIAXONE SODIUM 1 G IJ SOLR
1.0000 g | Freq: Once | INTRAMUSCULAR | Status: AC
  Administered 2021-12-13: 1 g via INTRAMUSCULAR
  Filled 2021-12-13: qty 10

## 2021-12-13 MED ORDER — FUROSEMIDE 10 MG/ML IJ SOLN
40.0000 mg | Freq: Once | INTRAMUSCULAR | Status: AC
  Administered 2021-12-13: 40 mg via INTRAVENOUS
  Filled 2021-12-13: qty 4

## 2021-12-13 MED ORDER — DEXTROSE 50 % IV SOLN
25.0000 g | Freq: Once | INTRAVENOUS | Status: AC
  Administered 2021-12-13: 25 g via INTRAVENOUS

## 2021-12-13 MED ORDER — FUROSEMIDE 10 MG/ML IJ SOLN: 100.0000 mg | Freq: Once | INTRAVENOUS | Status: DC

## 2021-12-13 MED ORDER — POTASSIUM CHLORIDE CRYS ER 20 MEQ PO TBCR
40.0000 meq | EXTENDED_RELEASE_TABLET | Freq: Once | ORAL | Status: AC
  Administered 2021-12-13: 40 meq via ORAL
  Filled 2021-12-13: qty 2

## 2021-12-13 MED ORDER — FUROSEMIDE 10 MG/ML IJ SOLN: 40.0000 mg | Freq: Once | INTRAMUSCULAR | Status: DC

## 2021-12-13 MED ORDER — FUROSEMIDE 10 MG/ML IJ SOLN
100.0000 mg | Freq: Once | INTRAMUSCULAR | Status: DC
  Filled 2021-12-13: qty 10

## 2021-12-13 MED ORDER — LIDOCAINE HCL (PF) 1 % IJ SOLN
5.0000 mL | Freq: Once | INTRAMUSCULAR | Status: AC
  Administered 2021-12-13: 5 mL
  Filled 2021-12-13: qty 5

## 2021-12-13 NOTE — ED Notes (Signed)
Patients blood sugar was 38 Reported to RN and Charge Gave patient 240 ml orange juice and peanut butter crackers given to patient

## 2021-12-13 NOTE — ED Triage Notes (Signed)
Patient c/o slid off the bed hitting right arm on end table around 3 am. EMS was called out to help get patient off the floor. Family brought in patient in for bleeding to right arm.

## 2021-12-13 NOTE — ED Notes (Signed)
Notified cooper that patient needed a blood  sugar. Also notified Dorian Pod that patient needed a cbg

## 2021-12-13 NOTE — ED Provider Notes (Signed)
[place] EMERGENCY DEPARTMENT Provider Note   CSN: [id number] Arrival date & time: 12/13/21  1223     History {Add pertinent medical, surgical, social history, OB history to HPI:1} Chief Complaint  Patient presents with  . Fall  . Laceration    Kristine [name] is a 76 y.o. female.   Fall  Laceration   76 year old female presents emergency department complaints of fall.  Patient states that she was try to get out of the bed earlier this morning when she slid down catching her right arm on a rail by her bedside.  Patient states that she has loss of function of her left lower extremity that is chronic in nature.  She is swelling affected leg over and could not account for weight.  She slid down cutting her right forearm as well as landing on her right knee.  Patient secondarily endorses hematuria at this been present for the past 3 days and worsening.  Denies fever, chills, night sweats, chest pain, shortness of breath, abdominal pain, nausea, vomiting, vaginal symptoms, dysuria, changes in bowel habits.  Past medical history significant for CHF, atrial fibrillation on Eliquis, morbid obesity, diabetes mellitus type 2, OSA,  Home Medications Prior to Admission medications   Medication Sig Start Date End Date Taking? Authorizing Provider  acetaminophen (TYLENOL) 500 MG tablet Take 1,000 mg by mouth every 6 (six) hours as needed for moderate pain or headache.    [provider]  allopurinol (ZYLOPRIM) 300 MG tablet Take 300 mg by mouth in the morning.    [provider]  allopurinol (ZYLOPRIM) 300 MG tablet  05/21/20   [provider]  ALPRAZolam Duanne Moron) 0.5 MG tablet Take 0.5 mg by mouth every 6 (six) hours as needed for anxiety.    [provider]  apixaban (ELIQUIS) 5 MG TABS tablet Take 1 tablet (5 mg total) by mouth 2 (two) times daily. 11/10/21   Ghimire, Henreitta Leber, MD  BD PEN NEEDLE NANO U/F 32G X 4 MM MISC Inject 1 each as  directed See admin instructions. Use pen needles with insulin pens daily 12/24/14   [provider]  diclofenac Sodium (VOLTAREN) 1 % GEL Apply 4 g topically 4 (four) times daily as needed (back pain). 09/19/21   Barton Dubois, MD  docusate sodium (COLACE) 100 MG capsule Take 100 mg by mouth daily as needed for mild constipation.    [provider]  DULoxetine (CYMBALTA) 30 MG capsule Take 30 mg by mouth in the morning. 09/26/20   [provider]  estradiol (ESTRACE) 0.1 MG/GM vaginal cream Place 1 Applicatorful vaginally as needed (vaginal irritation). 12/18/16   [provider]  furosemide (LASIX) 80 MG tablet Take 1 tablet (80 mg total) by mouth 2 (two) times daily. 11/11/21   Ghimire, Henreitta Leber, MD  insulin aspart (NOVOLOG FLEXPEN) 100 UNIT/ML FlexPen Inject 40-70 Units into the skin 3 (three) times daily with meals. Per sliding scale:Less than 150 units 150=40 units151-199=45 units 200-249=50 units 250-299=55 units 300-349=60 units    [provider]  insulin degludec (TRESIBA FLEXTOUCH) 200 UNIT/ML FlexTouch Pen Inject 26 Units into the skin daily. 11/10/21   Ghimire, Henreitta Leber, MD  levothyroxine (SYNTHROID) 88 MCG tablet Take 88 mcg by mouth every morning. 08/14/21   [provider]  lidocaine-prilocaine (EMLA) cream 1 gram qid prn Patient taking differently: Apply 1 Application topically once. 01/07/21   Marcial Pacas, MD  LYRICA 50 MG capsule Take 50 mg by mouth 2 (two)  times daily. 06/01/17   [provider]  metolazone (ZAROXOLYN) 2.5 MG tablet Take 1 tablet (2.5 mg total) by mouth once a week. Take 1 tablet every Thursdays 11/27/21 05/14/22  Rafael Bihari, FNP  metoprolol tartrate (LOPRESSOR) 25 MG tablet Take 0.5 tablets (12.5 mg total) by mouth 2 (two) times daily. 11/10/21   Ghimire, Henreitta Leber, MD  nystatin (MYCOSTATIN/NYSTOP) powder Apply 1 application  topically daily as needed (skin irritation/rash). 01/13/19   [provider]  ondansetron (ZOFRAN) 4 MG tablet Take 1 tablet (4 mg total) by mouth every 8 (eight) hours as needed for nausea or vomiting. 05/21/21   Scheeler, Carola Rhine, PA-C  Polyethyl Glycol-Propyl Glycol (SYSTANE OP) Place 1 drop into both eyes 2 (two) times daily as needed (dry eyes).    [provider]  polyethylene glycol (MIRALAX / GLYCOLAX) 17 g packet Take 17 g by mouth daily as needed for mild constipation.    [provider]  potassium chloride SA (KLOR-CON M) 20 MEQ tablet Take 2 tablets (40 mEq total) by mouth once a week. Take 2 tablets once weekly on Thursdays 11/27/21   Rafael Bihari, FNP  REGRANEX 0.01 % gel Apply 1 Application topically daily as needed for wound care. Apply to left foot 09/09/21   [provider]  traMADol (ULTRAM) 50 MG tablet Take 50 mg by mouth 3 (three) times daily as needed (severe neuropathy pain.). 06/24/19   [provider]  Vitamin D, Ergocalciferol, (DRISDOL) 50000 UNITS CAPS Take 50,000 Units by mouth every 7 (seven) days.    [provider]      Allergies    Albuterol, Epinephrine, Penicillins, Ceftriaxone, Citalopram hydrobromide, Exenatide, Ketorolac tromethamine, Tizanidine, Torsemide, Zoledronic acid, Bee venom, Ivp dye [iodinated contrast media], and Keflex [cephalexin]    Review of Systems   Review of Systems  All other systems reviewed and are negative.   Physical Exam Updated Vital Signs BP 93/67 (BP Location: Left Arm)   Pulse 86   Temp 97.9 F (36.6 C) (Oral)   Resp 17   Ht '5\' 9"'$  (1.753 m)   Wt 136.1 kg   SpO2 100%   BMI 44.30 kg/m  Physical Exam Vitals and nursing note reviewed.  Constitutional:      General: She is not in acute distress.    Appearance: Normal appearance. She is well-developed.  HENT:     Head: Normocephalic and atraumatic.  Eyes:     Conjunctiva/sclera: Conjunctivae normal.  Cardiovascular:     Rate and Rhythm: Normal rate and regular rhythm.     Heart sounds: No  murmur heard. Pulmonary:     Effort: Pulmonary effort is normal. No respiratory distress.     Breath sounds: Normal breath sounds.  Abdominal:     Palpations: Abdomen is soft.     Tenderness: There is no abdominal tenderness.  Musculoskeletal:        General: No swelling.     Cervical back: Neck supple.     Comments: Patient has full range of motion of right shoulder, elbow, wrist and digits.  Radial pulses full and intact bilaterally.  Patient has no bony tenderness along radius or ulna of affected arm.  Skin:    General: Skin is warm and dry.     Capillary Refill: Capillary refill takes less than 2 seconds.     Comments: 3 to 4 cm semilunar skin tear noted on the dorsal aspect of patient's right forearm.  Area is actively oozing  blood minimally.  No observable foreign body noted.  Neurological:     Mental Status: She is alert.  Psychiatric:        Mood and Affect: Mood normal.    ED Results / Procedures / Treatments   Labs (all labs ordered are listed, but only abnormal results are displayed) Labs Reviewed - No data to display  EKG None  Radiology No results found.  Procedures Procedures  {Document cardiac monitor, telemetry assessment procedure when appropriate:1}  Medications Ordered in ED Medications  lidocaine (PF) (XYLOCAINE) 1 % injection 5 mL (has no administration in time range)    ED Course/ Medical Decision Making/ A&P                           Medical Decision Making Amount and/or Complexity of Data Reviewed Labs: ordered. Radiology: ordered.  Risk Prescription drug management.   This patient presents to the ED for concern of ***, this involves an extensive number of treatment options, and is a complaint that carries with it a high risk of complications and morbidity.  The differential diagnosis includes ***   Co morbidities that complicate the patient evaluation  See HPI   Additional history obtained:  Additional history obtained from  EMR External records from outside source obtained and reviewed including ***   Lab Tests:  I Ordered, and personally interpreted labs.  The pertinent results include:  ***   Imaging Studies ordered:  I ordered imaging studies including ***  I independently visualized and interpreted imaging which showed *** I agree with the radiologist interpretation   Cardiac Monitoring: / EKG:  The patient was maintained on a cardiac monitor.  I personally viewed and interpreted the cardiac monitored which showed an underlying rhythm of: ***   Consultations Obtained:  I requested consultation with the ***,  and discussed lab and imaging findings as well as pertinent plan - they recommend: ***   Problem List / ED Course / Critical interventions / Medication management  *** I ordered medication including ***  for ***  Reevaluation of the patient after these medicines showed that the patient {resolved/improved/worsened:23923::"improved"} I have reviewed the patients home medicines and have made adjustments as needed   Social Determinants of Health:  ***   Test / Admission - Considered:  Vitals signs significant for ***. Otherwise within normal range and stable throughout visit. Laboratory/imaging studies significant for: *** *** Treatment plan were discussed at length with patient and they knowledge understanding was agreeable to said plan.  Appropriate consultations were made as described in the ED course.  Patient was stable upon admission to the hospital.   {Document critical care time when appropriate:1} {Document review of labs and clinical decision tools ie heart score, Chads2Vasc2 etc:1}  {Document your independent review of radiology images, and any outside records:1} {Document your discussion with family members, caretakers, and with consultants:1} {Document social determinants of health affecting pt's care:1} {Document your decision making why or why not admission,  treatments were needed:1} Final Clinical Impression(s) / ED Diagnoses Final diagnoses:  None    Rx / DC Orders ED Discharge Orders     None

## 2021-12-13 NOTE — ED Notes (Signed)
Patient has laceration on rt lower arm patient able to move finger and arm . Patient rt knee is bruised

## 2021-12-13 NOTE — ED Notes (Signed)
Bladder scan discontinued  Patient voided

## 2021-12-13 NOTE — Progress Notes (Signed)
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 She was also found to have hematuria and possible UTI for the last 3 days.  On further work up , she was found to be hypoglycemic, hypokalemic, .    CT chest , abdomen and pelvis shows moderate pericardial effusion.  Requested to get an EKG, .  Urology consulted by EDP for hematuria.    She is accepted to [facility] telemetry bed for evaluation of falls, UTI with hematuria, and moderate pericardial effusion.     Hosie Poisson, MD

## 2021-12-14 ENCOUNTER — Inpatient Hospital Stay (HOSPITAL_COMMUNITY): Payer: Medicare HMO

## 2021-12-14 DIAGNOSIS — I3139 Other pericardial effusion (noninflammatory): Secondary | ICD-10-CM | POA: Diagnosis present

## 2021-12-14 DIAGNOSIS — I272 Pulmonary hypertension, unspecified: Secondary | ICD-10-CM

## 2021-12-14 DIAGNOSIS — I5042 Chronic combined systolic (congestive) and diastolic (congestive) heart failure: Secondary | ICD-10-CM

## 2021-12-14 DIAGNOSIS — I4819 Other persistent atrial fibrillation: Secondary | ICD-10-CM | POA: Diagnosis not present

## 2021-12-14 DIAGNOSIS — E1165 Type 2 diabetes mellitus with hyperglycemia: Secondary | ICD-10-CM | POA: Diagnosis present

## 2021-12-14 DIAGNOSIS — S51811A Laceration without foreign body of right forearm, initial encounter: Secondary | ICD-10-CM | POA: Diagnosis present

## 2021-12-14 DIAGNOSIS — N390 Urinary tract infection, site not specified: Secondary | ICD-10-CM

## 2021-12-14 DIAGNOSIS — E11649 Type 2 diabetes mellitus with hypoglycemia without coma: Secondary | ICD-10-CM | POA: Diagnosis present

## 2021-12-14 DIAGNOSIS — W19XXXA Unspecified fall, initial encounter: Secondary | ICD-10-CM | POA: Diagnosis not present

## 2021-12-14 DIAGNOSIS — I5033 Acute on chronic diastolic (congestive) heart failure: Secondary | ICD-10-CM | POA: Diagnosis present

## 2021-12-14 DIAGNOSIS — N184 Chronic kidney disease, stage 4 (severe): Secondary | ICD-10-CM | POA: Diagnosis present

## 2021-12-14 DIAGNOSIS — E039 Hypothyroidism, unspecified: Secondary | ICD-10-CM | POA: Diagnosis present

## 2021-12-14 DIAGNOSIS — Z6841 Body Mass Index (BMI) 40.0 and over, adult: Secondary | ICD-10-CM | POA: Diagnosis not present

## 2021-12-14 DIAGNOSIS — R1909 Other intra-abdominal and pelvic swelling, mass and lump: Secondary | ICD-10-CM

## 2021-12-14 DIAGNOSIS — D649 Anemia, unspecified: Secondary | ICD-10-CM | POA: Diagnosis present

## 2021-12-14 DIAGNOSIS — N9489 Other specified conditions associated with female genital organs and menstrual cycle: Secondary | ICD-10-CM | POA: Diagnosis not present

## 2021-12-14 DIAGNOSIS — I130 Hypertensive heart and chronic kidney disease with heart failure and stage 1 through stage 4 chronic kidney disease, or unspecified chronic kidney disease: Secondary | ICD-10-CM | POA: Diagnosis present

## 2021-12-14 DIAGNOSIS — Z794 Long term (current) use of insulin: Secondary | ICD-10-CM | POA: Diagnosis not present

## 2021-12-14 DIAGNOSIS — E1142 Type 2 diabetes mellitus with diabetic polyneuropathy: Secondary | ICD-10-CM | POA: Diagnosis present

## 2021-12-14 DIAGNOSIS — F32A Depression, unspecified: Secondary | ICD-10-CM | POA: Diagnosis present

## 2021-12-14 DIAGNOSIS — N3001 Acute cystitis with hematuria: Secondary | ICD-10-CM | POA: Diagnosis not present

## 2021-12-14 DIAGNOSIS — R601 Generalized edema: Secondary | ICD-10-CM | POA: Diagnosis present

## 2021-12-14 DIAGNOSIS — I69354 Hemiplegia and hemiparesis following cerebral infarction affecting left non-dominant side: Secondary | ICD-10-CM | POA: Diagnosis not present

## 2021-12-14 DIAGNOSIS — Z66 Do not resuscitate: Secondary | ICD-10-CM | POA: Diagnosis present

## 2021-12-14 DIAGNOSIS — Y92009 Unspecified place in unspecified non-institutional (private) residence as the place of occurrence of the external cause: Secondary | ICD-10-CM | POA: Diagnosis not present

## 2021-12-14 DIAGNOSIS — E1122 Type 2 diabetes mellitus with diabetic chronic kidney disease: Secondary | ICD-10-CM | POA: Diagnosis present

## 2021-12-14 DIAGNOSIS — I2781 Cor pulmonale (chronic): Secondary | ICD-10-CM | POA: Diagnosis present

## 2021-12-14 DIAGNOSIS — Z515 Encounter for palliative care: Secondary | ICD-10-CM | POA: Diagnosis not present

## 2021-12-14 DIAGNOSIS — W06XXXA Fall from bed, initial encounter: Secondary | ICD-10-CM | POA: Diagnosis present

## 2021-12-14 DIAGNOSIS — I700 Atherosclerosis of aorta: Secondary | ICD-10-CM | POA: Diagnosis present

## 2021-12-14 DIAGNOSIS — E162 Hypoglycemia, unspecified: Secondary | ICD-10-CM | POA: Diagnosis present

## 2021-12-14 DIAGNOSIS — I2729 Other secondary pulmonary hypertension: Secondary | ICD-10-CM | POA: Diagnosis present

## 2021-12-14 DIAGNOSIS — I4821 Permanent atrial fibrillation: Secondary | ICD-10-CM | POA: Diagnosis present

## 2021-12-14 DIAGNOSIS — R319 Hematuria, unspecified: Secondary | ICD-10-CM

## 2021-12-14 DIAGNOSIS — E876 Hypokalemia: Secondary | ICD-10-CM

## 2021-12-14 DIAGNOSIS — G4733 Obstructive sleep apnea (adult) (pediatric): Secondary | ICD-10-CM

## 2021-12-14 DIAGNOSIS — Z7189 Other specified counseling: Secondary | ICD-10-CM | POA: Diagnosis not present

## 2021-12-14 DIAGNOSIS — D689 Coagulation defect, unspecified: Secondary | ICD-10-CM | POA: Diagnosis present

## 2021-12-14 LAB — COMPREHENSIVE METABOLIC PANEL
ALT: 9 U/L (ref 0–44)
AST: 16 U/L (ref 15–41)
Albumin: 3.5 g/dL (ref 3.5–5.0)
Alkaline Phosphatase: 86 U/L (ref 38–126)
Anion gap: 9 (ref 5–15)
BUN: 66 mg/dL — ABNORMAL HIGH (ref 8–23)
CO2: 30 mmol/L (ref 22–32)
Calcium: 9.5 mg/dL (ref 8.9–10.3)
Chloride: 98 mmol/L (ref 98–111)
Creatinine, Ser: 2.34 mg/dL — ABNORMAL HIGH (ref 0.44–1.00)
GFR, Estimated: 21 mL/min — ABNORMAL LOW (ref >60)
Glucose, Bld: 173 mg/dL — ABNORMAL HIGH (ref 70–99)
Potassium: 3.5 mmol/L (ref 3.5–5.1)
Sodium: 137 mmol/L (ref 135–145)
Total Bilirubin: 1.5 mg/dL — ABNORMAL HIGH (ref 0.3–1.2)
Total Protein: 6.8 g/dL (ref 6.5–8.1)

## 2021-12-14 LAB — CBC WITH DIFFERENTIAL/PLATELET
Abs Immature Granulocytes: 0.05 10*3/uL (ref 0.00–0.07)
Basophils Absolute: 0.1 10*3/uL (ref 0.0–0.1)
Basophils Relative: 1 %
Eosinophils Absolute: 0.3 10*3/uL (ref 0.0–0.5)
Eosinophils Relative: 3 %
HCT: 33.5 % — ABNORMAL LOW (ref 36.0–46.0)
Hemoglobin: 10.1 g/dL — ABNORMAL LOW (ref 12.0–15.0)
Immature Granulocytes: 1 %
Lymphocytes Relative: 12 %
Lymphs Abs: 1.3 10*3/uL (ref 0.7–4.0)
MCH: 27.6 pg (ref 26.0–34.0)
MCHC: 30.1 g/dL (ref 30.0–36.0)
MCV: 91.5 fL (ref 80.0–100.0)
Monocytes Absolute: 0.7 10*3/uL (ref 0.1–1.0)
Monocytes Relative: 6 %
Neutro Abs: 8.1 10*3/uL — ABNORMAL HIGH (ref 1.7–7.7)
Neutrophils Relative %: 77 %
Platelets: 141 10*3/uL — ABNORMAL LOW (ref 150–400)
RBC: 3.66 MIL/uL — ABNORMAL LOW (ref 3.87–5.11)
RDW: 18.2 % — ABNORMAL HIGH (ref 11.5–15.5)
WBC: 10.4 10*3/uL (ref 4.0–10.5)
nRBC: 0 % (ref 0.0–0.2)

## 2021-12-14 LAB — GLUCOSE, CAPILLARY
Glucose-Capillary: 146 mg/dL — ABNORMAL HIGH (ref 70–99)
Glucose-Capillary: 184 mg/dL — ABNORMAL HIGH (ref 70–99)
Glucose-Capillary: 180 mg/dL — ABNORMAL HIGH (ref 70–99)

## 2021-12-14 LAB — ECHOCARDIOGRAM LIMITED
Area-P 1/2: 3.96 cm2
Height: 69 in
MV M vel: 2.01 m/s
MV Peak grad: 16.2 mmHg
S' Lateral: 4.9 cm
Weight: 4800 oz

## 2021-12-14 LAB — PHOSPHORUS: Phosphorus: 4.1 mg/dL (ref 2.5–4.6)

## 2021-12-14 LAB — CBG MONITORING, ED
Glucose-Capillary: 142 mg/dL — ABNORMAL HIGH (ref 70–99)
Glucose-Capillary: 146 mg/dL — ABNORMAL HIGH (ref 70–99)
Glucose-Capillary: 121 mg/dL — ABNORMAL HIGH (ref 70–99)

## 2021-12-14 LAB — MAGNESIUM: Magnesium: 2.4 mg/dL (ref 1.7–2.4)

## 2021-12-14 MED ORDER — DULOXETINE HCL 30 MG PO CPEP: 30.0000 mg | ORAL_CAPSULE | Freq: Every morning | ORAL | Status: DC

## 2021-12-14 MED ORDER — ONDANSETRON 4 MG PO TBDP: 4.0000 mg | ORAL_TABLET | Freq: Three times a day (TID) | ORAL | Status: DC | PRN

## 2021-12-14 MED ORDER — ONDANSETRON HCL 4 MG PO TABS: 4.0000 mg | ORAL_TABLET | Freq: Three times a day (TID) | ORAL | Status: DC | PRN

## 2021-12-14 MED ORDER — DEXTROSE 50 % IV SOLN: 25.0000 g | INTRAVENOUS | Status: DC | PRN

## 2021-12-14 MED ORDER — ACETAMINOPHEN 325 MG PO TABS: 650.0000 mg | ORAL_TABLET | Freq: Four times a day (QID) | ORAL | Status: DC | PRN

## 2021-12-14 MED ORDER — ACETAMINOPHEN 500 MG PO TABS: 1000.0000 mg | ORAL_TABLET | Freq: Four times a day (QID) | ORAL | Status: DC | PRN

## 2021-12-14 MED ORDER — INSULIN ASPART 100 UNIT/ML IJ SOLN
0.0000 [IU] | Freq: Three times a day (TID) | INTRAMUSCULAR | Status: DC
  Administered 2021-12-15 (×2): 5 [IU] via SUBCUTANEOUS
  Administered 2021-12-15: 2 [IU] via SUBCUTANEOUS
  Administered 2021-12-16: 5 [IU] via SUBCUTANEOUS
  Administered 2021-12-16: 3 [IU] via SUBCUTANEOUS
  Administered 2021-12-16 – 2021-12-17 (×2): 8 [IU] via SUBCUTANEOUS
  Administered 2021-12-17: 3 [IU] via SUBCUTANEOUS
  Administered 2021-12-17: 8 [IU] via SUBCUTANEOUS
  Administered 2021-12-18: 5 [IU] via SUBCUTANEOUS
  Administered 2021-12-18 – 2021-12-19 (×3): 3 [IU] via SUBCUTANEOUS
  Administered 2021-12-19: 8 [IU] via SUBCUTANEOUS
  Administered 2021-12-19: 3 [IU] via SUBCUTANEOUS
  Administered 2021-12-20 – 2021-12-21 (×5): 5 [IU] via SUBCUTANEOUS

## 2021-12-14 MED ORDER — TRAMADOL HCL 50 MG PO TABS
50.0000 mg | ORAL_TABLET | Freq: Three times a day (TID) | ORAL | Status: DC | PRN
  Administered 2021-12-14 – 2021-12-17 (×3): 50 mg via ORAL
  Filled 2021-12-14 (×3): qty 1

## 2021-12-14 MED ORDER — METOPROLOL TARTRATE 25 MG PO TABS
12.5000 mg | ORAL_TABLET | Freq: Two times a day (BID) | ORAL | Status: DC
  Administered 2021-12-14 – 2021-12-20 (×14): 12.5 mg via ORAL
  Filled 2021-12-14 (×14): qty 1

## 2021-12-14 MED ORDER — FUROSEMIDE 10 MG/ML IJ SOLN
60.0000 mg | Freq: Two times a day (BID) | INTRAMUSCULAR | Status: DC
  Administered 2021-12-14 – 2021-12-15 (×2): 60 mg via INTRAVENOUS
  Filled 2021-12-14 (×2): qty 6

## 2021-12-14 MED ORDER — LEVOTHYROXINE SODIUM 88 MCG PO TABS
88.0000 ug | ORAL_TABLET | Freq: Every day | ORAL | Status: DC
  Administered 2021-12-15 – 2021-12-21 (×7): 88 ug via ORAL
  Filled 2021-12-14 (×7): qty 1

## 2021-12-14 MED ORDER — MAGNESIUM OXIDE -MG SUPPLEMENT 400 (240 MG) MG PO TABS
400.0000 mg | ORAL_TABLET | Freq: Two times a day (BID) | ORAL | Status: DC
  Administered 2021-12-14 – 2021-12-16 (×4): 400 mg via ORAL
  Filled 2021-12-14 (×5): qty 1

## 2021-12-14 MED ORDER — ACETAMINOPHEN 325 MG PO TABS
650.0000 mg | ORAL_TABLET | Freq: Four times a day (QID) | ORAL | Status: DC | PRN
  Administered 2021-12-19 – 2021-12-20 (×2): 650 mg via ORAL
  Filled 2021-12-14 (×2): qty 2

## 2021-12-14 MED ORDER — ALLOPURINOL 300 MG PO TABS
300.0000 mg | ORAL_TABLET | Freq: Every morning | ORAL | Status: DC
  Administered 2021-12-15 – 2021-12-21 (×7): 300 mg via ORAL
  Filled 2021-12-14 (×8): qty 1

## 2021-12-14 MED ORDER — ONDANSETRON HCL 4 MG/2ML IJ SOLN: 4.0000 mg | Freq: Four times a day (QID) | INTRAMUSCULAR | Status: DC | PRN

## 2021-12-14 MED ORDER — ONDANSETRON HCL 4 MG PO TABS: 4.0000 mg | ORAL_TABLET | Freq: Four times a day (QID) | ORAL | Status: DC | PRN

## 2021-12-14 MED ORDER — SODIUM CHLORIDE 0.9 % IV SOLN
1.0000 g | INTRAVENOUS | Status: DC
  Administered 2021-12-14: 1 g via INTRAVENOUS
  Filled 2021-12-14: qty 10

## 2021-12-14 MED ORDER — ALPRAZOLAM 0.5 MG PO TABS
0.5000 mg | ORAL_TABLET | Freq: Four times a day (QID) | ORAL | Status: DC | PRN
  Administered 2021-12-15 – 2021-12-20 (×4): 0.5 mg via ORAL
  Filled 2021-12-14 (×4): qty 1

## 2021-12-14 MED ORDER — POTASSIUM CHLORIDE CRYS ER 20 MEQ PO TBCR
40.0000 meq | EXTENDED_RELEASE_TABLET | Freq: Once | ORAL | Status: AC
  Administered 2021-12-14: 40 meq via ORAL
  Filled 2021-12-14: qty 2

## 2021-12-14 MED ORDER — SODIUM CHLORIDE 0.9 % IV SOLN
2.0000 g | INTRAVENOUS | Status: DC
  Administered 2021-12-15: 2 g via INTRAVENOUS
  Filled 2021-12-14: qty 20

## 2021-12-14 MED ORDER — FUROSEMIDE 40 MG PO TABS
80.0000 mg | ORAL_TABLET | Freq: Two times a day (BID) | ORAL | Status: DC
  Administered 2021-12-14: 80 mg via ORAL
  Filled 2021-12-14: qty 2

## 2021-12-14 MED ORDER — ACETAMINOPHEN 650 MG RE SUPP: 650.0000 mg | Freq: Four times a day (QID) | RECTAL | Status: DC | PRN

## 2021-12-14 MED ORDER — DOCUSATE SODIUM 100 MG PO CAPS
100.0000 mg | ORAL_CAPSULE | Freq: Two times a day (BID) | ORAL | Status: DC | PRN
  Administered 2021-12-15 – 2021-12-17 (×4): 100 mg via ORAL
  Filled 2021-12-14 (×4): qty 1

## 2021-12-14 MED ORDER — NYSTATIN 100000 UNIT/GM EX POWD
Freq: Three times a day (TID) | CUTANEOUS | Status: DC
  Filled 2021-12-14 (×2): qty 15

## 2021-12-14 NOTE — Progress Notes (Signed)
Patient arrived to unit.  No s/s of distress noted.  Denies any pain.  Placed on telemetry box and verified with ccmd.  Vitals completed.  Patient only with a pair of shoes and a gown as belongings.  Skin checked with Rama, RN.  Husband at bedside.

## 2021-12-14 NOTE — Plan of Care (Signed)

## 2021-12-14 NOTE — Progress Notes (Signed)
*  PRELIMINARY RESULTS* Echocardiogram 2D Echocardiogram has been performed.  Kristine [name] 12/14/2021, 3:59 PM

## 2021-12-14 NOTE — ED Notes (Signed)
Lasix was given due to excess fluids .

## 2021-12-14 NOTE — H&P (Signed)
History and Physical    Patient: Kristine [name] DVV:[id number] DOB: 07/06/1945 DOA: 12/13/2021 DOS: the patient was seen and examined on 12/14/2021 PCP: Prince Solian, MD  Patient coming from: Home  Chief Complaint:  Chief Complaint  Patient presents with   Fall   Laceration   HPI: Kristine [name] is a 76 y.o. female with medical history significant of seasonal allergies, asthma, anxiety, depression, chronic atrial fibrillation on apixaban, history of amiodarone toxicity, osteoarthritis, chronic combined systolic and diastolic CHF, community-acquired pneumonia, cataracts, chronic bronchitis, stage IV CKD, dyslipidemia, GERD, hyperlipidemia mild aortic sclerosis, class III obesity, diabetic peripheral neuropathy, type II DM, osteoporosis, hypothyroidism, sleep apnea, scoliosis, spinal stenosis, vitamin D deficiency, history of other nonhemorrhagic CVA with residual left-sided weakness requiring a walker to ambulate who is slipped out of bed falling on her right knee and also injuring her right forearm with a laceration.  She has been having hematuria for the past 3 days.  However, no flank pain, dysuria or frequency.  No vaginal discharge.    She has also noticed significant lower extremity and abdomen edema.  However, she denied chest pain, palpitations, diaphoresis, PND or orthopnea.  Her appetite is decreased.  No fever, chills or night sweats. No sore throat, rhinorrhea, dyspnea, wheezing or hemoptysis.  No abdominal pain, nausea, vomiting, diarrhea, melena or hematochezia.  She gets frequently constipated. No polyuria, polydipsia, polyphagia or blurred vision.  ED course: Initial vital signs were temperature 97.9 F, pulse 86, respirations 17, BP 93/67 mmHg O2 sat 100% on room air.  Patient received 1 g of ceftriaxone IM, 40 mEq of KCl p.o., KCl 10 mEq IVPB x3 and dextrose 25 g IVP x1 after developing hypoglycemia.  Lab work: Her urinalysis chemical result was not able to be done  due to hematuria.  Microscopic examination showed more than 50 RBC, more than 50 WBC and many bacteria.  CBC showed a white count 11.5 with 76% neutrophils, hemoglobin 11.0 g/dL platelets 159.  CMP showed a sodium 137, potassium 2.9, chloride 97 and CO2 30 mmol/L.  Glucose is 52, BUN 66 and creatinine 2.35 mg/dL.  Renal function results are similar to baseline.  LFTs were normal except for total bilirubin of 1.6 units/L.  Magnesium was 2.4 mg/dL.  Imaging: Right knee x-ray showed prepatellar soft tissue swelling, but no acute osseous abnormality.  CT chest/abdomen/pelvis positive for enlargement of previously known pericardial effusion, cardiomegaly, diffuse anasarca, small volume splenic free fluid, right adnexal mass measuring 4.2 x 4.3 cm with recommendations for pelvic ultrasound and aortic atherosclerosis.   Review of Systems: As mentioned in the history of present illness. All other systems reviewed and are negative. 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   Anxiety    Arthritis    Asthma    Atrial fibrillation ([place])    CAP (community acquired pneumonia) 05/31/2015   Cataract    CHF (congestive heart failure) (HCC)    Chronic bronchitis (HCC)    Chronic kidney disease    Clotting disorder ([place])    Community acquired pneumonia 05/31/2015   Depression    Dysrhythmia    GERD (gastroesophageal reflux disease)    High cholesterol    History of hiatal hernia    HTN (hypertension)    Mild aortic sclerosis    Morbid obesity ([place])    Neuromuscular disorder ([place])    Neuropathic pain    Osteoporosis    Persistent atrial fibrillation ([place])  Scoliosis    Sleep apnea    Spinal stenosis    Thyroid disease    Type II diabetes mellitus ([place])    Vitamin D deficiency    Past Surgical History:  Procedure Laterality Date   BLADDER SUSPENSION  1980s   CARDIAC CATHETERIZATION  11/16/09   SMOOTH AND NORMAL   CARDIOVERSION N/A 01/07/2015   Procedure:  CARDIOVERSION;  Surgeon: Skeet Latch, MD;  Location: [facility] ENDOSCOPY;  Service: Cardiovascular;  Laterality: N/A;   CARDIOVERSION N/A 03/11/2015   Procedure: CARDIOVERSION;  Surgeon: Skeet Latch, MD;  Location: [place];  Service: Cardiovascular;  Laterality: N/A;   CARPAL TUNNEL RELEASE Left 1970s   CATARACT EXTRACTION W/ INTRAOCULAR LENS  IMPLANT, BILATERAL Bilateral ~ 2010   COLONOSCOPY  08/14/08   FINGER FRACTURE SURGERY Right 1970s   "ring finger"   FRACTURE SURGERY     HEMATOMA EVACUATION Left 05/21/2021   Procedure: EVACUATION LEFT KNEE HEMATOMA;  Surgeon: Lennice Sites, MD;  Location: [place];  Service: Plastics;  Laterality: Left;   IR US GUIDE BX ASP/DRAIN  10/31/2021   LAPAROSCOPIC CHOLECYSTECTOMY  1980s   TEE WITHOUT CARDIOVERSION N/A 11/03/2021   Procedure: TRANSESOPHAGEAL ECHOCARDIOGRAM (TEE);  Surgeon: Jolaine Artist, MD;  Location: [facility] ENDOSCOPY;  Service: Cardiovascular;  Laterality: N/A;   [place]   Social History:  reports that she has never smoked. She has never used smokeless tobacco. She reports that she does not drink alcohol and does not use drugs.  Allergies  Allergen Reactions   Albuterol Other (See Comments)    REACTION: Tachycardia- AFib   Epinephrine Other (See Comments)    Increases heart rate per patient.    Penicillins Other (See Comments)    REACTION: flushing' \\T'$ \ hot  Did it involve swelling of the face/tongue/throat, SOB, or low BP? No Did it involve sudden or severe rash/hives, skin peeling, or any reaction on the inside of your mouth or nose? No Did you need to seek medical attention at a hospital or doctor's office? No When did it last happen?    20 years ago   If all above answers are "NO", may proceed with cephalosporin use.   UPDATE 11/05/21: Tolerated Cefazolin 10/29/21 to 11/05/21, switched to oral cefadroxil 11/05/21.    Ceftriaxone Other (See Comments)    Sweats.   UPDATE 11/05/21: Tolerated  Cefazolin 10/29/21 to 11/05/21, switched to oral cefadroxil 11/05/21.   Citalopram Hydrobromide Other (See Comments)    Prolonged QT prolongation   Exenatide Other (See Comments)    Unknown reaction   Ketorolac Tromethamine Hives and Swelling    swelling in hands   Tizanidine Other (See Comments)    lethargic   Torsemide Swelling   Zoledronic Acid Other (See Comments)    Pt was hospitalized due to medication   Bee Venom Other (See Comments)    "makes me nervous"   Ivp Dye [Iodinated Contrast Media] Other (See Comments)    flushing   Keflex [Cephalexin] Other (See Comments)    Makes her feel warm; she was told never to take it.    UPDATE 11/05/21: Tolerated Cefazolin 10/29/21 to 11/05/21, switched to oral cefadroxil 11/05/21.     Family History  Problem Relation Age of Onset   Stroke Father    Hypertension Father    Atrial fibrillation Father        HAD MURMUR   Heart failure Mother    Congestive Heart Failure Mother  Hypertension Mother    Hypertension Brother    Hypertension Sister    Hypertension Son    CAD Sister        EARLY   CAD Brother        EARLY   Colon cancer Neg Hx    Esophageal cancer Neg Hx    Stomach cancer Neg Hx    Rectal cancer Neg Hx     Prior to Admission medications   Medication Sig Start Date End Date Taking? Authorizing Provider  acetaminophen (TYLENOL) 500 MG tablet Take 1,000 mg by mouth every 6 (six) hours as needed for moderate pain or headache.    [provider]  allopurinol (ZYLOPRIM) 300 MG tablet Take 300 mg by mouth in the morning.    [provider]  allopurinol (ZYLOPRIM) 300 MG tablet  05/21/20   [provider]  ALPRAZolam Duanne Moron) 0.5 MG tablet Take 0.5 mg by mouth every 6 (six) hours as needed for anxiety.    [provider]  apixaban (ELIQUIS) 5 MG TABS tablet Take 1 tablet (5 mg total) by mouth 2 (two) times daily. 11/10/21   Ghimire, Henreitta Leber, MD  BD PEN NEEDLE NANO U/F 32G X 4 MM MISC Inject 1 each  as directed See admin instructions. Use pen needles with insulin pens daily 12/24/14   [provider]  diclofenac Sodium (VOLTAREN) 1 % GEL Apply 4 g topically 4 (four) times daily as needed (back pain). 09/19/21   Barton Dubois, MD  docusate sodium (COLACE) 100 MG capsule Take 100 mg by mouth daily as needed for mild constipation.    [provider]  DULoxetine (CYMBALTA) 30 MG capsule Take 30 mg by mouth in the morning. 09/26/20   [provider]  estradiol (ESTRACE) 0.1 MG/GM vaginal cream Place 1 Applicatorful vaginally as needed (vaginal irritation). 12/18/16   [provider]  furosemide (LASIX) 80 MG tablet Take 1 tablet (80 mg total) by mouth 2 (two) times daily. 11/11/21   Ghimire, Henreitta Leber, MD  insulin aspart (NOVOLOG FLEXPEN) 100 UNIT/ML FlexPen Inject 40-70 Units into the skin 3 (three) times daily with meals. Per sliding scale:Less than 150 units 150=40 units151-199=45 units 200-249=50 units 250-299=55 units 300-349=60 units    [provider]  insulin degludec (TRESIBA FLEXTOUCH) 200 UNIT/ML FlexTouch Pen Inject 26 Units into the skin daily. 11/10/21   Ghimire, Henreitta Leber, MD  levothyroxine (SYNTHROID) 88 MCG tablet Take 88 mcg by mouth every morning. 08/14/21   [provider]  lidocaine-prilocaine (EMLA) cream 1 gram qid prn Patient taking differently: Apply 1 Application topically once. 01/07/21   Marcial Pacas, MD  LYRICA 50 MG capsule Take 50 mg by mouth 2 (two) times daily. 06/01/17   [provider]  metolazone (ZAROXOLYN) 2.5 MG tablet Take 1 tablet (2.5 mg total) by mouth once a week. Take 1 tablet every Thursdays 11/27/21 05/14/22  Rafael Bihari, FNP  metoprolol tartrate (LOPRESSOR) 25 MG tablet Take 0.5 tablets (12.5 mg total) by mouth 2 (two) times daily. 11/10/21   Ghimire, Henreitta Leber, MD  nystatin (MYCOSTATIN/NYSTOP) powder Apply 1 application  topically daily as needed (skin irritation/rash). 01/13/19   [provider]  ondansetron (ZOFRAN) 4 MG tablet Take 1 tablet (4 mg total) by mouth every 8 (eight) hours as needed for nausea or vomiting. 05/21/21   Scheeler, Carola Rhine, PA-C  Polyethyl Glycol-Propyl Glycol (SYSTANE OP) Place 1 drop into both eyes 2 (two) times daily as needed (dry eyes).  [provider]  polyethylene glycol (MIRALAX / GLYCOLAX) 17 g packet Take 17 g by mouth daily as needed for mild constipation.    [provider]  potassium chloride SA (KLOR-CON M) 20 MEQ tablet Take 2 tablets (40 mEq total) by mouth once a week. Take 2 tablets once weekly on Thursdays 11/27/21   Rafael Bihari, FNP  REGRANEX 0.01 % gel Apply 1 Application topically daily as needed for wound care. Apply to left foot 09/09/21   [provider]  traMADol (ULTRAM) 50 MG tablet Take 50 mg by mouth 3 (three) times daily as needed (severe neuropathy pain.). 06/24/19   [provider]  Vitamin D, Ergocalciferol, (DRISDOL) 50000 UNITS CAPS Take 50,000 Units by mouth every 7 (seven) days.    [provider]    Physical Exam: Vitals:   12/14/21 1100 12/14/21 1105 12/14/21 1110 12/14/21 1225  BP: (!) 121/59   117/81  Pulse: (!) 115 91 92 90  Resp: '16 20 16 18  '$ Temp:    97.7 F (36.5 C)  TempSrc:    Oral  SpO2: 94% 96% 97% 98%  Weight:      Height:       Physical Exam Vitals and nursing note reviewed.  Constitutional:      General: She is awake. She is not in acute distress.    Appearance: She is morbidly obese. She is ill-appearing. She is not toxic-appearing or diaphoretic.  HENT:     Head: Normocephalic.     Nose: No rhinorrhea.     Mouth/Throat:     Mouth: Mucous membranes are moist.  Eyes:     General: No scleral icterus.    Pupils: Pupils are equal, round, and reactive to light.  Neck:     Vascular: No JVD.  Cardiovascular:     Rate and Rhythm: Normal rate and regular rhythm.     Heart sounds: S1 normal and S2 normal.  Pulmonary:     Effort:  Pulmonary effort is normal.     Breath sounds: Normal breath sounds. No wheezing, rhonchi or rales.  Abdominal:     General: Bowel sounds are normal. There is distension.     Palpations: Abdomen is soft.  Musculoskeletal:     Cervical back: Neck supple.     Right lower leg: Edema present.     Left lower leg: Edema present.  Skin:    Findings: Bruising present.  Neurological:     General: No focal deficit present.     Mental Status: She is alert and oriented to person, place, and time.  Psychiatric:        Mood and Affect: Mood normal.        Behavior: Behavior normal. Behavior is cooperative.     Data Reviewed:  Results are pending, will review when available.  Echocardiogram IMPRESSIONS    1. D shaped septum with severe cor pulmonale known . Left ventricular  ejection fraction, by estimation, is 45 to 50%. The left ventricle has  mildly decreased function. The left ventricle demonstrates global  hypokinesis. There is mild left ventricular  hypertrophy. Left ventricular diastolic parameters are indeterminate.   2. Patient has known severe RV failure on TTE done 08/08/20. Right  ventricular systolic function is severely reduced. The right ventricular  size is severely enlarged. There is moderately elevated pulmonary artery  systolic pressure.   3. Left atrial size was moderately dilated.   4. A small pericardial effusion is present. The pericardial effusion  is  lateral to the left ventricle and posterior to the left ventricle.   5. The mitral valve is abnormal. Trivial mitral valve regurgitation.   6. Tricuspid valve regurgitation is moderate  Assessment and Plan: Principal Problem:   Fall Has left knee contusion. Analgesics as needed. Consider physical therapy evaluation in AM.  Active Problems:   Anasarca In the setting of:   Chronic combined systolic and diastolic CHF (HCC) And   Pulmonary hypertension Limited echo with no significant pleural effusion. However,  echo showed mild decreased EF and RV failure. Furosemide 60 mg IVP twice daily ordered. Cardiology consultation for the morning has been requested. Given comorbidities, long-term prognosis is poor.    Essential hypertension Continue metoprolol 12.5 mg p.o. twice daily. Also on IV furosemide as above. Monitor blood pressure and heart rate.    Persistent atrial fibrillation (HCC) Apixaban held. Continue metoprolol 12.5 mg p.o. twice daily.    Hypoglycemia Resolved. Hold insulin for now. CBG monitoring before meals and bedtime.    Diabetes mellitus, type 2 (HCC) Carbohydrate modified diet. Holding insulin as above.    Diabetic peripheral neuropathy (HCC) Continue duloxetine 30 mg p.o. daily. Continue tramadol as needed for pain.    Hematuria In the setting of:   Acute lower UTI Continue ceftriaxone 2 g IVPB daily. Urology recommended to hold apixaban due to hematuria. Follow-up urine culture and sensitivity.      Hypokalemia Replacing. Follow potassium level.    CKD (chronic kidney disease) stage 4, GFR 15-29 ml/min (HCC) Monitor renal function electrolytes.    Other intra-abdominal and pelvic swelling, mass and lump Right adnexal mass. Will need pelvic ultrasound as IP or OP.    Normocytic anemia Monitor hematocrit hemoglobin. Transfuse as needed.    Obesity, Class III, BMI 40-49.9 (morbid obesity) (HCC) Lifestyle modifications. Follow-up with primary care provider.    Obstructive sleep apnea Not on CPAP.    Gout Continue allopurinol.   Hypothyroidism Continue levothyroxine 88 mcg p.o. daily.     Advance Care Planning:   Code Status: Partial code with no CPR or intubation.  Consults:   Family Communication: Her husband was at bedside and provided some information.  Severity of Illness: The appropriate patient status for this patient is INPATIENT. Inpatient status is judged to be reasonable and necessary in order to provide the required intensity of  service to ensure the patient's safety. The patient's presenting symptoms, physical exam findings, and initial radiographic and laboratory data in the context of their chronic comorbidities is felt to place them at high risk for further clinical deterioration. Furthermore, it is not anticipated that the patient will be medically stable for discharge from the hospital within 2 midnights of admission.   * I certify that at the point of admission it is my clinical judgment that the patient will require inpatient hospital care spanning beyond 2 midnights from the point of admission due to high intensity of service, high risk for further deterioration and high frequency of surveillance required.*  Author: Reubin Milan, MD 12/14/2021 1:12 PM  For on call review [email or web address].   This document was prepared using Dragon voice recognition software and may contain some unintended transcription errors.

## 2021-12-14 NOTE — ED Notes (Signed)
Pt called out for a new dressing to the laceration to RT forearm. Dermabond in place. Telfa placed and kerlix wrapped around arm loosely due to swelling.

## 2021-12-14 NOTE — ED Notes (Signed)
Patient left  facility by [facility]

## 2021-12-14 NOTE — ED Notes (Signed)
Pt also requesting sugar check due to not having eaten anything and sugars having been 38 earlier during shift.

## 2021-12-15 ENCOUNTER — Encounter (HOSPITAL_COMMUNITY): Payer: Self-pay | Admitting: Internal Medicine

## 2021-12-15 ENCOUNTER — Inpatient Hospital Stay (HOSPITAL_COMMUNITY): Payer: Medicare HMO

## 2021-12-15 DIAGNOSIS — N9489 Other specified conditions associated with female genital organs and menstrual cycle: Secondary | ICD-10-CM

## 2021-12-15 DIAGNOSIS — W19XXXA Unspecified fall, initial encounter: Secondary | ICD-10-CM | POA: Diagnosis not present

## 2021-12-15 DIAGNOSIS — I10 Essential (primary) hypertension: Secondary | ICD-10-CM

## 2021-12-15 DIAGNOSIS — I5042 Chronic combined systolic (congestive) and diastolic (congestive) heart failure: Secondary | ICD-10-CM | POA: Diagnosis not present

## 2021-12-15 DIAGNOSIS — E1142 Type 2 diabetes mellitus with diabetic polyneuropathy: Secondary | ICD-10-CM

## 2021-12-15 DIAGNOSIS — N3001 Acute cystitis with hematuria: Secondary | ICD-10-CM | POA: Diagnosis not present

## 2021-12-15 DIAGNOSIS — R601 Generalized edema: Secondary | ICD-10-CM | POA: Diagnosis not present

## 2021-12-15 DIAGNOSIS — Z794 Long term (current) use of insulin: Secondary | ICD-10-CM

## 2021-12-15 DIAGNOSIS — N184 Chronic kidney disease, stage 4 (severe): Secondary | ICD-10-CM | POA: Diagnosis not present

## 2021-12-15 DIAGNOSIS — N390 Urinary tract infection, site not specified: Secondary | ICD-10-CM | POA: Diagnosis not present

## 2021-12-15 LAB — HEMOGLOBIN A1C
Hgb A1c MFr Bld: 6.3 % — ABNORMAL HIGH (ref 4.8–5.6)
Mean Plasma Glucose: 134.11 mg/dL

## 2021-12-15 LAB — URINALYSIS, ROUTINE W REFLEX MICROSCOPIC
Bilirubin Urine: NEGATIVE
Glucose, UA: NEGATIVE mg/dL
Ketones, ur: NEGATIVE mg/dL
Nitrite: NEGATIVE
Protein, ur: 100 mg/dL — AB
RBC / HPF: >50 RBC/hpf — ABNORMAL HIGH (ref 0–5)
Specific Gravity, Urine: 1.011 (ref 1.005–1.030)
pH: 6 (ref 5.0–8.0)

## 2021-12-15 LAB — CBC
HCT: 33.6 % — ABNORMAL LOW (ref 36.0–46.0)
Hemoglobin: 10.1 g/dL — ABNORMAL LOW (ref 12.0–15.0)
MCH: 27.5 pg (ref 26.0–34.0)
MCHC: 30.1 g/dL (ref 30.0–36.0)
MCV: 91.6 fL (ref 80.0–100.0)
Platelets: 159 10*3/uL (ref 150–400)
RBC: 3.67 MIL/uL — ABNORMAL LOW (ref 3.87–5.11)
RDW: 18.5 % — ABNORMAL HIGH (ref 11.5–15.5)
WBC: 10.6 10*3/uL — ABNORMAL HIGH (ref 4.0–10.5)
nRBC: 0.2 % (ref 0.0–0.2)

## 2021-12-15 LAB — GLUCOSE, CAPILLARY
Glucose-Capillary: 160 mg/dL — ABNORMAL HIGH (ref 70–99)
Glucose-Capillary: 146 mg/dL — ABNORMAL HIGH (ref 70–99)
Glucose-Capillary: 209 mg/dL — ABNORMAL HIGH (ref 70–99)
Glucose-Capillary: 208 mg/dL — ABNORMAL HIGH (ref 70–99)
Glucose-Capillary: 182 mg/dL — ABNORMAL HIGH (ref 70–99)

## 2021-12-15 LAB — COMPREHENSIVE METABOLIC PANEL
ALT: 9 U/L (ref 0–44)
AST: 18 U/L (ref 15–41)
Albumin: 3.5 g/dL (ref 3.5–5.0)
Alkaline Phosphatase: 81 U/L (ref 38–126)
Anion gap: 10 (ref 5–15)
BUN: 66 mg/dL — ABNORMAL HIGH (ref 8–23)
CO2: 28 mmol/L (ref 22–32)
Calcium: 9.4 mg/dL (ref 8.9–10.3)
Chloride: 101 mmol/L (ref 98–111)
Creatinine, Ser: 2.13 mg/dL — ABNORMAL HIGH (ref 0.44–1.00)
GFR, Estimated: 24 mL/min — ABNORMAL LOW (ref >60)
Glucose, Bld: 177 mg/dL — ABNORMAL HIGH (ref 70–99)
Potassium: 3.9 mmol/L (ref 3.5–5.1)
Sodium: 139 mmol/L (ref 135–145)
Total Bilirubin: 1.3 mg/dL — ABNORMAL HIGH (ref 0.3–1.2)
Total Protein: 6.5 g/dL (ref 6.5–8.1)

## 2021-12-15 LAB — PROTIME-INR
INR: 1.4 — ABNORMAL HIGH (ref 0.8–1.2)
Prothrombin Time: 16.7 seconds — ABNORMAL HIGH (ref 11.4–15.2)

## 2021-12-15 LAB — MAGNESIUM: Magnesium: 2.5 mg/dL — ABNORMAL HIGH (ref 1.7–2.4)

## 2021-12-15 MED ORDER — FUROSEMIDE 10 MG/ML IJ SOLN
100.0000 mg | Freq: Two times a day (BID) | INTRAVENOUS | Status: DC
  Administered 2021-12-15 – 2021-12-17 (×4): 100 mg via INTRAVENOUS
  Filled 2021-12-15 (×4): qty 10

## 2021-12-15 MED ORDER — PREGABALIN 50 MG PO CAPS
50.0000 mg | ORAL_CAPSULE | Freq: Two times a day (BID) | ORAL | Status: DC
  Administered 2021-12-15 – 2021-12-21 (×13): 50 mg via ORAL
  Filled 2021-12-15 (×13): qty 1

## 2021-12-15 NOTE — Consult Note (Signed)
Urology Consult   Physician requesting consult: Dr. Karleen Hampshire  Reason for consult: Gross hematuria  History of Present Illness: Kristine [name] is a 76 y.o. female with history of atrial fibrillation on apixaban, CHF, stage IV CKD, HLD, obesity, DM, CVA w/ residual left sided weakness who presented as a trauma 2 days ago s/p fall in setting of gross hematuria. Non-contrasted CT imaging of abdomen and pelvis with normal kidneys and urinary tract without signs of obstruction, lesions or stones. CT also notable for right 4cm adnexal mass. UA at time of admission concerning for UTI and patient started on IV Ceftriaxone. During her hospitalization, she has been afebrile and stable. Her hemoglobin is 10.1 and stable.   She notes that she first noticed the hematuria approximately 1-2 weeks ago. Her urine was light pink and color and she notes that it has remained thin without clot. At that time, she was also having dysuria and urinary frequency.   She is currently voiding spontaneously without any difficulty with a pure wick device in place.  She denies seeing a urologist in the past. Notes remote history of UTIs. Denies personal or family history of GU malignancy.   Her home eliquis has been held since admission due to gross hematuria.  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   Anxiety    Arthritis    Asthma    CAP (community acquired pneumonia) 05/31/2015   Cataract    Chronic bronchitis (HCC)    Chronic heart failure with preserved ejection fraction (HFpEF) ([place])    Chronic kidney disease (CKD), stage IV (severe) (HCC)    Clotting disorder ([place])    Community acquired pneumonia 05/31/2015   Cor pulmonale (chronic) (HCC)    Depression    GERD (gastroesophageal reflux disease)    High cholesterol    History of hiatal hernia    HTN (hypertension)    Mild aortic sclerosis    Morbid obesity ([place])    Neuromuscular disorder ([place])    Neuropathic  pain    Osteoporosis    Permanent atrial fibrillation ([place])    Scoliosis    Sleep apnea    Spinal stenosis    Thyroid disease    Type II diabetes mellitus ([place])    Vitamin D deficiency     Past Surgical History:  Procedure Laterality Date   BLADDER SUSPENSION  1980s   CARDIAC CATHETERIZATION  11/16/09   SMOOTH AND NORMAL   CARDIOVERSION N/A 01/07/2015   Procedure: CARDIOVERSION;  Surgeon: Skeet Latch, MD;  Location: [facility];  Service: Cardiovascular;  Laterality: N/A;   CARDIOVERSION N/A 03/11/2015   Procedure: CARDIOVERSION;  Surgeon: Skeet Latch, MD;  Location: [place];  Service: Cardiovascular;  Laterality: N/A;   CARPAL TUNNEL RELEASE Left 1970s   CATARACT EXTRACTION W/ INTRAOCULAR LENS  IMPLANT, BILATERAL Bilateral ~ 2010   COLONOSCOPY  08/14/08   FINGER FRACTURE SURGERY Right 1970s   "ring finger"   FRACTURE SURGERY     HEMATOMA EVACUATION Left 05/21/2021   Procedure: EVACUATION LEFT KNEE HEMATOMA;  Surgeon: Lennice Sites, MD;  Location: [facility];  Service: Plastics;  Laterality: Left;   IR US GUIDE BX ASP/DRAIN  10/31/2021   LAPAROSCOPIC CHOLECYSTECTOMY  1980s   TEE WITHOUT CARDIOVERSION N/A 11/03/2021   Procedure: TRANSESOPHAGEAL ECHOCARDIOGRAM (TEE);  Surgeon: Jolaine Artist, MD;  Location: [facility] ENDOSCOPY;  Service: Cardiovascular;  Laterality: N/A;   [place]  Current Hospital Medications:  Home Meds:  Current Meds  Medication Sig   acetaminophen (TYLENOL) 500 MG tablet Take 500 mg by mouth every 6 (six) hours as needed for moderate pain or headache.   allopurinol (ZYLOPRIM) 300 MG tablet Take 300 mg by mouth daily.   ALPRAZolam (XANAX) 0.5 MG tablet Take 0.5 mg by mouth every 6 (six) hours as needed for anxiety.   apixaban (ELIQUIS) 5 MG TABS tablet Take 1 tablet (5 mg total) by mouth 2 (two) times daily.   docusate sodium (COLACE) 100 MG capsule Take 100 mg by mouth daily as needed for mild constipation.    DULoxetine (CYMBALTA) 30 MG capsule Take 30 mg by mouth in the morning.   estradiol (ESTRACE) 0.1 MG/GM vaginal cream Place 1 Applicatorful vaginally as needed (vaginal irritation).   fluconazole (DIFLUCAN) 150 MG tablet Take 150 mg by mouth every other day. 3 doses.   furosemide (LASIX) 80 MG tablet Take 1 tablet (80 mg total) by mouth 2 (two) times daily.   insulin aspart (NOVOLOG FLEXPEN) 100 UNIT/ML FlexPen Inject 40-60 Units into the skin See admin instructions. Inject 40-60 units 3 times daily with meals per sliding scale: CBG < 150 : 40 units CBG 151-199 : 45 units CBG 200-249 : 50 units CBG 250-299 : 55 units CBG 300-349 : 60 units   insulin degludec (TRESIBA FLEXTOUCH) 200 UNIT/ML FlexTouch Pen Inject 26 Units into the skin daily. (Patient taking differently: Inject 25 Units into the skin daily before breakfast.)   levothyroxine (SYNTHROID) 88 MCG tablet Take 88 mcg by mouth every morning.   lidocaine-prilocaine (EMLA) cream 1 gram qid prn (Patient taking differently: Apply 1 Application topically daily as needed (severe neuropathy pain).)   metolazone (ZAROXOLYN) 2.5 MG tablet Take 1 tablet (2.5 mg total) by mouth once a week. Take 1 tablet every Thursdays (Patient taking differently: Take 2.5 mg by mouth See admin instructions. 2.5 mg once weekly on Thursday)   metoprolol tartrate (LOPRESSOR) 25 MG tablet Take 0.5 tablets (12.5 mg total) by mouth 2 (two) times daily.   nystatin (MYCOSTATIN/NYSTOP) powder Apply 1 Application topically 2 (two) times daily as needed (yeast infection).   ondansetron (ZOFRAN) 4 MG tablet Take 1 tablet (4 mg total) by mouth every 8 (eight) hours as needed for nausea or vomiting.   Polyethyl Glycol-Propyl Glycol (SYSTANE OP) Place 1 drop into both eyes 2 (two) times daily as needed (dry eyes).   polyethylene glycol (MIRALAX / GLYCOLAX) 17 g packet Take 17 g by mouth daily as needed for mild constipation.   potassium chloride SA (KLOR-CON M) 20 MEQ tablet  Take 2 tablets (40 mEq total) by mouth once a week. Take 2 tablets once weekly on Thursdays (Patient taking differently: Take 40 mEq by mouth See admin instructions. 20 meq on Tuesday, Thursday)   pregabalin (LYRICA) 50 MG capsule Take 50 mg by mouth 2 (two) times daily.   REGRANEX 0.01 % gel Apply 1 Application topically daily as needed for wound care. Apply to left foot   traMADol (ULTRAM) 50 MG tablet Take 50 mg by mouth 3 (three) times daily as needed (severe neuropathy pain).   Vitamin D, Ergocalciferol, (DRISDOL) 50000 UNITS CAPS Take 50,000 Units by mouth every Monday.    Scheduled Meds:  allopurinol  300 mg Oral q AM   insulin aspart  0-15 Units Subcutaneous TID WC   levothyroxine  88 mcg Oral QAC breakfast   magnesium oxide  400 mg Oral BID  metoprolol tartrate  12.5 mg Oral BID   nystatin   Topical TID   pregabalin  50 mg Oral BID   Continuous Infusions:  sodium chloride 10 mL/hr at 12/14/21 1700   cefTRIAXone (ROCEPHIN)  IV 2 g (12/15/21 1422)   furosemide     PRN Meds:.sodium chloride, acetaminophen **OR** acetaminophen, ALPRAZolam, dextrose, docusate sodium, ondansetron **OR** ondansetron (ZOFRAN) IV, traMADol  Allergies:  Allergies  Allergen Reactions   Epipen [Epinephrine] Other (See Comments)    Tachycardia    Ventolin [Albuterol] Other (See Comments)    Tachycardia A-Fib   Penicillins Other (See Comments)    Flushing Felt hot  Did it involve swelling of the face/tongue/throat, SOB, or low BP? No Did it involve sudden or severe rash/hives, skin peeling, or any reaction on the inside of your mouth or nose? No Did you need to seek medical attention at a hospital or doctor's office? No When did it last happen?    20 years ago   If all above answers are "NO", may proceed with cephalosporin use.   UPDATE 11/05/21: Tolerated Cefazolin 10/29/21 to 11/05/21, switched to oral cefadroxil 11/05/21.    Byetta 10 Mcg Pen [Exenatide] Other (See Comments)    Unknown reaction    Celexa [Citalopram Hydrobromide] Other (See Comments)    Prolonged QT prolongation   Demadex [Torsemide] Swelling   Reclast [Zoledronic Acid] Other (See Comments)    Pt was hospitalized due to medication   Rocephin [Ceftriaxone] Other (See Comments)    Sweats UPDATE 11/05/21: Tolerated Cefazolin 10/29/21 to 11/05/21, switched to oral cefadroxil 11/05/21.   Tape Other (See Comments)    Skin tears easily   Toradol [Ketorolac Tromethamine] Hives and Swelling    Swelling in hands   Zanaflex [Tizanidine] Other (See Comments)    Lethargy    Bee Venom Other (See Comments)    "makes me nervous"   Iodinated Contrast Media Other (See Comments)    Flushing     Keflex [Cephalexin] Other (See Comments)    Makes her feel warm; she was told never to take it.     UPDATE 11/05/21: Tolerated Cefazolin 10/29/21 to 11/05/21, switched to oral cefadroxil 11/05/21.     Family History  Problem Relation Age of Onset   Stroke Father    Hypertension Father    Atrial fibrillation Father        HAD MURMUR   Heart failure Mother    Congestive Heart Failure Mother    Hypertension Mother    Hypertension Brother    Hypertension Sister    Hypertension Son    CAD Sister        EARLY   CAD Brother        EARLY   Colon cancer Neg Hx    Esophageal cancer Neg Hx    Stomach cancer Neg Hx    Rectal cancer Neg Hx     Social History:  reports that she has never smoked. She has never used smokeless tobacco. She reports that she does not drink alcohol and does not use drugs.  ROS: A complete review of systems was performed.  All systems are negative except for pertinent findings as noted.  Physical Exam:  Vital signs in last 24 hours: Temp:  [97.4 F (36.3 C)-98.3 F (36.8 C)] 97.4 F (36.3 C) (09/25 1409) Pulse Rate:  [76-100] 78 (09/25 1409) Resp:  [18-19] 18 (09/25 1409) BP: (113-128)/(58-92) 128/76 (09/25 1409) SpO2:  [88 %-98 %] 98 % (09/25 1409)  Constitutional:  No acute distress, very drowsy GI:  Abdomen is soft, nontender, nondistended GU: Voiding spontaneously, external urinary catheter in place, urine in tubing is clear, urine in cannister is thin tea colored.  Laboratory Data:  Recent Labs    12/13/21 1433 12/14/21 1323 12/15/21 0357  WBC 11.5* 10.4 10.6*  HGB 11.0* 10.1* 10.1*  HCT 35.9* 33.5* 33.6*  PLT 159 141* 159    Recent Labs    12/13/21 1433 12/14/21 1323 12/15/21 0357  NA 137 137 139  K 2.9* 3.5 3.9  CL 97* 98 101  GLUCOSE 52* 173* 177*  BUN 66* 66* 66*  CALCIUM 9.6 9.5 9.4  CREATININE 2.35* 2.34* 2.13*     Results for orders placed or performed during the hospital encounter of 12/13/21 (from the past 24 hour(s))  Glucose, capillary     Status: Abnormal   Collection Time: 12/14/21  9:03 PM  Result Value Ref Range   Glucose-Capillary 180 (H) 70 - 99 mg/dL  Glucose, capillary     Status: Abnormal   Collection Time: 12/15/21  2:52 AM  Result Value Ref Range   Glucose-Capillary 160 (H) 70 - 99 mg/dL  Protime-INR     Status: Abnormal   Collection Time: 12/15/21  3:57 AM  Result Value Ref Range   Prothrombin Time 16.7 (H) 11.4 - 15.2 seconds   INR 1.4 (H) 0.8 - 1.2  CBC     Status: Abnormal   Collection Time: 12/15/21  3:57 AM  Result Value Ref Range   WBC 10.6 (H) 4.0 - 10.5 K/uL   RBC 3.67 (L) 3.87 - 5.11 MIL/uL   Hemoglobin 10.1 (L) 12.0 - 15.0 g/dL   HCT 33.6 (L) 36.0 - 46.0 %   MCV 91.6 80.0 - 100.0 fL   MCH 27.5 26.0 - 34.0 pg   MCHC 30.1 30.0 - 36.0 g/dL   RDW 18.5 (H) 11.5 - 15.5 %   Platelets 159 150 - 400 K/uL   nRBC 0.2 0.0 - 0.2 %  Comprehensive metabolic panel     Status: Abnormal   Collection Time: 12/15/21  3:57 AM  Result Value Ref Range   Sodium 139 135 - 145 mmol/L   Potassium 3.9 3.5 - 5.1 mmol/L   Chloride 101 98 - 111 mmol/L   CO2 28 22 - 32 mmol/L   Glucose, Bld 177 (H) 70 - 99 mg/dL   BUN 66 (H) 8 - 23 mg/dL   Creatinine, Ser 2.13 (H) 0.44 - 1.00 mg/dL   Calcium 9.4 8.9 - 10.3 mg/dL   Total Protein 6.5 6.5 - 8.1  g/dL   Albumin 3.5 3.5 - 5.0 g/dL   AST 18 15 - 41 U/L   ALT 9 0 - 44 U/L   Alkaline Phosphatase 81 38 - 126 U/L   Total Bilirubin 1.3 (H) 0.3 - 1.2 mg/dL   GFR, Estimated 24 (L) >60 mL/min   Anion gap 10 5 - 15  Magnesium     Status: Abnormal   Collection Time: 12/15/21  4:14 AM  Result Value Ref Range   Magnesium 2.5 (H) 1.7 - 2.4 mg/dL  Glucose, capillary     Status: Abnormal   Collection Time: 12/15/21  8:05 AM  Result Value Ref Range   Glucose-Capillary 146 (H) 70 - 99 mg/dL  Glucose, capillary     Status: Abnormal   Collection Time: 12/15/21 11:23 AM  Result Value Ref Range   Glucose-Capillary 209 (H) 70 - 99 mg/dL  Urinalysis, Routine w  reflex microscopic Urine, Clean Catch     Status: Abnormal   Collection Time: 12/15/21  2:43 PM  Result Value Ref Range   Color, Urine RED (A) YELLOW   APPearance CLOUDY (A) CLEAR   Specific Gravity, Urine 1.011 1.005 - 1.030   pH 6.0 5.0 - 8.0   Glucose, UA NEGATIVE NEGATIVE mg/dL   Hgb urine dipstick MODERATE (A) NEGATIVE   Bilirubin Urine NEGATIVE NEGATIVE   Ketones, ur NEGATIVE NEGATIVE mg/dL   Protein, ur 100 (A) NEGATIVE mg/dL   Nitrite NEGATIVE NEGATIVE   Leukocytes,Ua SMALL (A) NEGATIVE   RBC / HPF >50 (H) 0 - 5 RBC/hpf   WBC, UA 6-10 0 - 5 WBC/hpf   Bacteria, UA RARE (A) NONE SEEN   Squamous Epithelial / LPF 6-10 0 - 5  Glucose, capillary     Status: Abnormal   Collection Time: 12/15/21  4:11 PM  Result Value Ref Range   Glucose-Capillary 208 (H) 70 - 99 mg/dL   No results found for this or any previous visit (from the past 240 hour(s)).  Renal Function: Recent Labs    12/10/21 1052 12/12/21 1056 12/13/21 1433 12/14/21 1323 12/15/21 0357  CREATININE 2.23* 2.06* 2.35* 2.34* 2.13*   Estimated Creatinine Clearance: 34.5 mL/min (A) (by C-G formula based on SCr of 2.13 mg/dL (H)).  Radiologic Imaging: US PELVIS (TRANSABDOMINAL ONLY)  Result Date: 12/15/2021 CLINICAL DATA:  Adnexal mass EXAM: TRANSABDOMINAL  ULTRASOUND OF PELVIS TECHNIQUE: Transabdominal ultrasound examination of the pelvis was performed including evaluation of the uterus, ovaries, adnexal regions, and pelvic cul-de-sac. COMPARISON:  CT chest abdomen and pelvis 12/13/2021. FINDINGS: Uterus Surgically absent. Right ovary Measurements: Not visulaized.  No adnexal mass. Left ovary Measurements: Not visulaized. No adnexal mass. Other findings:  No abnormal free fluid. IMPRESSION: 1. Right adnexal mass seen on CT is not visualized on this exam. Consider MRI for further evaluation. Electronically Signed   By: Ronney Asters M.D.   On: 12/15/2021 15:44   ECHOCARDIOGRAM LIMITED  Result Date: 12/14/2021    ECHOCARDIOGRAM LIMITED REPORT   Patient Name:   Kristine [name] Date of Exam: 12/14/2021 Medical Rec #:  [id number]            Height:       69.0 in Accession #:    [id number]           Weight:       300.0 lb Date of Birth:  28-Jul-1945           BSA:          2.455 m Patient Age:    23 years             BP:           117/81 mmHg Patient Gender: F                    HR:           85 bpm. Exam Location:  Inpatient Procedure: Limited Echo, Color Doppler and Cardiac Doppler Indications:    pericardial effusion and CHF  History:        Patient has prior history of Echocardiogram examinations, most                 recent 08/08/2020. CHF; Risk Factors:Diabetes and Hypertension.  Sonographer:    Harvie Junior Referring Phys: [id number] [place]  Sonographer Comments: Technically difficult study due to poor echo windows, suboptimal apical window, no subcostal window,  suboptimal parasternal window and patient is obese. Image acquisition challenging due to patient body habitus and supine, pain and bleeding under breast. IMPRESSIONS  1. D shaped septum with severe cor pulmonale known . Left ventricular ejection fraction, by estimation, is 45 to 50%. The left ventricle has mildly decreased function. The left ventricle demonstrates global hypokinesis. There is  mild left ventricular hypertrophy. Left ventricular diastolic parameters are indeterminate.  2. Patient has known severe RV failure on TTE done 08/08/20. Right ventricular systolic function is severely reduced. The right ventricular size is severely enlarged. There is moderately elevated pulmonary artery systolic pressure.  3. Left atrial size was moderately dilated.  4. A small pericardial effusion is present. The pericardial effusion is lateral to the left ventricle and posterior to the left ventricle.  5. The mitral valve is abnormal. Trivial mitral valve regurgitation.  6. Tricuspid valve regurgitation is moderate. FINDINGS  Left Ventricle: D shaped septum with severe cor pulmonale known. Left ventricular ejection fraction, by estimation, is 45 to 50%. The left ventricle has mildly decreased function. The left ventricle demonstrates global hypokinesis. There is mild left ventricular hypertrophy. Left ventricular diastolic parameters are indeterminate. Right Ventricle: Patient has known severe RV failure on TTE done 08/08/20. The right ventricular size is severely enlarged. Right vetricular wall thickness was not assessed. Right ventricular systolic function is severely reduced. There is moderately elevated pulmonary artery systolic pressure. The tricuspid regurgitant velocity is 3.51 m/s, and with an assumed right atrial pressure of 8 mmHg, the estimated right ventricular systolic pressure is 40.3 mmHg. Left Atrium: Left atrial size was moderately dilated. Pericardium: A small pericardial effusion is present. The pericardial effusion is lateral to the left ventricle and posterior to the left ventricle. Mitral Valve: The mitral valve is abnormal. There is mild thickening of the mitral valve leaflet(s). There is mild calcification of the mitral valve leaflet(s). Mild mitral annular calcification. Trivial mitral valve regurgitation. Tricuspid Valve: Tricuspid valve regurgitation is moderate. Pulmonic Valve: Pulmonic  valve regurgitation is mild. IAS/Shunts: The interatrial septum was not well visualized. LEFT VENTRICLE PLAX 2D LVIDd:         5.80 cm   Diastology LVIDs:         4.90 cm   LV e' medial:    7.62 cm/s LV PW:         1.30 cm   LV E/e' medial:  16.0 LV IVS:        1.30 cm   LV e' lateral:   5.98 cm/s LVOT diam:     1.90 cm   LV E/e' lateral: 20.3 LVOT Area:     2.84 cm  RIGHT VENTRICLE RV Basal diam:  5.40 cm RV Mid diam:    5.70 cm RV S prime:     8.27 cm/s LEFT ATRIUM           Index        RIGHT ATRIUM           Index LA diam:      4.80 cm 1.96 cm/m   RA Area:     21.90 cm LA Vol (A4C): 42.2 ml 17.19 ml/m  RA Volume:   70.70 ml  28.80 ml/m                        PULMONIC VALVE AORTA                 PV Vmax:  0.65 m/s Ao Root diam: 3.50 cm PV Peak grad:     1.7 mmHg Ao Asc diam:  3.50 cm PR End Diast Vel: 3.91 msec  MITRAL VALVE                TRICUSPID VALVE MV Area (PHT): 3.96 cm     TR Peak grad:   49.3 mmHg MV Decel Time: 192 msec     TR Vmax:        351.00 cm/s MR Peak grad: 16.2 mmHg MR Vmax:      201.00 cm/s   SHUNTS MV E velocity: 121.67 cm/s  Systemic Diam: 1.90 cm MV A velocity: 57.50 cm/s MV E/A ratio:  2.12 Jenkins Rouge MD Electronically signed by Jenkins Rouge MD Signature Date/Time: 12/14/2021/4:10:37 PM    Final     I independently reviewed the above imaging studies.  Impression: 76 y.o. female with history of atrial fibrillation on apixaban, CHF, stage IV CKD, HLD, obesity, DM, CVA w/ residual left sided weakness who presented as a trauma 2 days ago s/p fall in setting of gross hematuria.  She is afebrile and stable. Hgb is stable. Cr is at baseline (~2.1). Admission UA concerning for UTI. Urine culture is in process. Currently receiving IV Rocephin. Admission CT with normal kidneys and urinary tract without concern for obstruction, lesions, masses or stones.  Discussed common reasons for gross hematuria with patient and her husband including but not limited to infection,  urolithiasis, and malignancy. Likely her gross hematuria is secondary to UTI since it started with the onset of her dysuria and frequency.   Recommendation: - No indication for acute urologic surgical intervention at this time. - Please obtain PVR to ensure patient is emptying bladder well. - Continue IV Rocephin at this time. Follow up urine culture and tailor antibiotics appropriately. Recommend total of 7 day course of antibiotics for UTI. - If ok from medical perspective, would recommend holding anticoagulation until hematuria resolves. If unable to do so, OK for heparin.  Coralyn Pear, MD Resident Physician [facility] 12/15/2021, 6:04 PM

## 2021-12-15 NOTE — Progress Notes (Signed)
Pt had 6 beats of VT.  Asymptomatic.  Melina Copa, PA and Dr. Karleen Hampshire notified. No new orders. Andre Lefort

## 2021-12-15 NOTE — Plan of Care (Signed)
  Problem: Education: Goal: Knowledge of General Education information will improve Description: Including pain rating scale, medication(s)/side effects and non-pharmacologic comfort measures Outcome: Progressing   Problem: Clinical Measurements: Goal: Diagnostic test results will improve Outcome: Progressing Goal: Respiratory complications will improve Outcome: Progressing   Problem: Activity: Goal: Risk for activity intolerance will decrease Outcome: Progressing   Problem: Elimination: Goal: Will not experience complications related to bowel motility Outcome: Progressing   Problem: Pain Managment: Goal: General experience of comfort will improve Outcome: Progressing

## 2021-12-15 NOTE — Plan of Care (Signed)
  Problem: Education: Goal: Knowledge of General Education information will improve Description Including pain rating scale, medication(s)/side effects and non-pharmacologic comfort measures Outcome: Progressing   Problem: Health Behavior/Discharge Planning: Goal: Ability to manage health-related needs will improve Outcome: Progressing   

## 2021-12-15 NOTE — Progress Notes (Signed)
PROGRESS NOTE    Kristine [name]  FIE:[id number] DOB: [birth year] DOA: 12/13/2021 PCP: Prince Solian, MD (Confirm with patient/family/NH records and if not entered, this HAS to be entered at [facility] point of entry. "No PCP" if truly none.)   Chief Complaint  Patient presents with   Fall   Laceration    Brief Narrative:    Zahlia Deshazer is a 76 y.o. female with medical history significant of seasonal allergies, asthma, anxiety, depression, chronic atrial fibrillation on apixaban, history of amiodarone toxicity, osteoarthritis, chronic combined systolic and diastolic CHF, community-acquired pneumonia, cataracts, chronic bronchitis, stage IV CKD, dyslipidemia, GERD, hyperlipidemia mild aortic sclerosis, class III obesity, diabetic peripheral neuropathy, type II DM, osteoporosis, hypothyroidism, sleep apnea, scoliosis, spinal stenosis, vitamin D deficiency, history of other nonhemorrhagic CVA with residual left-sided weakness requiring a walker to ambulate who is slipped out of bed falling on her right knee and also injuring her right forearm with a laceration.  She has been having hematuria for the past 3 days. She has also noticed significant lower extremity and abdomen edema.  CT chest/abdomen/pelvis positive for enlargement of previously known pericardial effusion, cardiomegaly, diffuse anasarca, small volume splenic free fluid, right adnexal mass measuring 4.2 x 4.3 cm with recommendations for pelvic ultrasound and aortic atherosclerosis.  She was admitted to [facility] for falls and hematuria. Cardiology consulted for volume overload and pericardial effusion.   Assessment & Plan:   Principal Problem:   Fall Active Problems:   Diabetes mellitus, type 2 ([place])   Diabetic peripheral neuropathy (HCC)   Hypokalemia   CKD (chronic kidney disease) stage 4, GFR 15-29 ml/min (HCC)   Essential hypertension   Pulmonary hypertension (HCC)   Persistent atrial fibrillation (HCC)   Normocytic  anemia   Obesity, Class III, BMI 40-49.9 (morbid obesity) ([place])   Obstructive sleep apnea   Gout   Chronic combined systolic and diastolic CHF (congestive heart failure) (HCC)   Hypoglycemia   Acute lower UTI   Other intra-abdominal and pelvic swelling, mass and lump   Hematuria   Hypothyroidism   Anasarca   Acute on chronic diastolic heart failure with preserved LVef.  Anasarca.  Rv Failure on last echocardiogram.  Severe Pulmonary hypertension/ cardiomyopathy.  Cardiology on board .  Currently being diuresed with IV lasix 100 mg gtt .     Recurrent falls:  Deconditioning, will get therapy evaluations.  Dietary consult.    Type 2 DM; with diabetic neuropathy , insulin dependent.  Uncontrolled with hypoglycemia on admission CBG (last 3)  Recent Labs    12/15/21 0252 12/15/21 0805 12/15/21 1123  GLUCAP 160* 146* 209*    Hypoglycemia resolved.  Continue with SSI. Last A1c is 6. In June 2023.  Continue with Lyrica 50 mg BID for diabetic neuropathy.  Patient was on 26 units of Tresiba, which will be held and recheck hemoglobin A1c.    Persistent Atrial fibrillation:  Rate between 100 to 110/min.  On metoprolol 12.5 mg BID.  On anti coagulation with eliquis, which is on hold for hematuria.   Hematuria / UTI:  She was started on IV rocephin. Urine cultures ordered. Repeat UA ordered today.  Urology consulted for hematuria.  If okay with Urology, would like to start the patient on IV heparin or eliquis.    Hypothyroidism:  Resume synthroid.   Abnormal CT abdomen:  Showing small volume splenic free fluid, right adnexal mass measuring 4.2 x 4.3 cm.  ? Concern for splenic injury in view of the fall.  Patient denies any back pain or abdominal pain.  Will discuss with radiology .    Stage 4 CKD:  Creatinine at baseline.    Hypokalemia:  Replaced and wnl.    Right adnexal mass;  Would recommend  getting a pelvic ultrasound.    Right upper extremity/  right axillar vein  DVT - On Eliquis , which is on hold.  - if okay, with Urology , will start the patient on IV heparin.     Body mass index is 44.3 kg/m. Morbid Obesity.  Poor indicator/ prognosis.   Anemia of chronic disease:  Baseline hemoglobin at 10, it appears to be stable.   DVT prophylaxis: scd's Code Status: PARTIAL CODE Family Communication: Husband at bedside.  Disposition:   Status is: Inpatient Remains inpatient appropriate because: IV antibiotics.    Level of care: Telemetry Consultants:  Cardiology Urology.  Palliative care   Procedures:  Echocardiogram.   Antimicrobials:  Antibiotics Given (last 72 hours)     Date/Time Action Medication Dose Rate   12/13/21 1641 Given   cefTRIAXone (ROCEPHIN) injection 1 g 1 g    12/14/21 1354 New Bag/Given   cefTRIAXone (ROCEPHIN) 1 g in sodium chloride 0.9 % 100 mL IVPB 1 g 200 mL/hr         Subjective:   Objective: Vitals:   12/15/21 0024 12/15/21 0644 12/15/21 0707 12/15/21 1012  BP: (!) 126/92 121/74  (!) 113/58  Pulse: 100 100  76  Resp: 19 19    Temp: 98 F (36.7 C) 97.6 F (36.4 C)    TempSrc: Oral Oral    SpO2: 95% (!) 88% 94%   Weight:      Height:        Intake/Output Summary (Last 24 hours) at 12/15/2021 1335 Last data filed at 12/15/2021 0846 Gross per 24 hour  Intake 726.13 ml  Output 1000 ml  Net -273.87 ml   Filed Weights   12/13/21 1238  Weight: 136.1 kg    Examination:  General exam: ill appearing elderly woman, not in distress.  Respiratory system: diminished air entry at bases, on RA.  Cardiovascular system: S1 & S2 heard, RRR.  Pedal edema,  Gastrointestinal system: Abdomen is nondistended, soft and nontender. Normal bowel sounds heard. Central nervous system: Alert and oriented to place and person, generalized weakness.  Extremities: right upper extremity swelling.  Skin: laceration over the right arm.  Psychiatry: flat affect.     Data Reviewed: I have  personally reviewed following labs and imaging studies  CBC: Recent Labs  Lab 12/13/21 1433 12/14/21 1323 12/15/21 0357  WBC 11.5* 10.4 10.6*  NEUTROABS 8.9* 8.1*  --   HGB 11.0* 10.1* 10.1*  HCT 35.9* 33.5* 33.6*  MCV 89.1 91.5 91.6  PLT 159 141* 176    Basic Metabolic Panel: Recent Labs  Lab 12/10/21 1052 12/12/21 1056 12/13/21 1433 12/14/21 1323 12/15/21 0357 12/15/21 0414  NA 136 141 137 137 139  --   K 2.9* 4.2 2.9* 3.5 3.9  --   CL 93* 95* 97* 98 101  --   CO2 '26 23 30 30 28  '$ --   GLUCOSE 130* 140* 52* 173* 177*  --   BUN 66* 59* 66* 66* 66*  --   CREATININE 2.23* 2.06* 2.35* 2.34* 2.13*  --   CALCIUM 9.6 9.5 9.6 9.5 9.4  --   MG  --   --  2.4 2.4  --  2.5*  PHOS  --   --   --  4.1  --   --     GFR: Estimated Creatinine Clearance: 34.5 mL/min (A) (by C-G formula based on SCr of 2.13 mg/dL (H)).  Liver Function Tests: Recent Labs  Lab 12/13/21 1433 12/14/21 1323 12/15/21 0357  AST '20 16 18  '$ ALT '9 9 9  '$ ALKPHOS 91 86 81  BILITOT 1.6* 1.5* 1.3*  PROT 7.1 6.8 6.5  ALBUMIN 3.8 3.5 3.5    CBG: Recent Labs  Lab 12/14/21 1700 12/14/21 2103 12/15/21 0252 12/15/21 0805 12/15/21 1123  GLUCAP 184* 180* 160* 146* 209*     No results found for this or any previous visit (from the past 240 hour(s)).       Radiology Studies: ECHOCARDIOGRAM LIMITED  Result Date: 12/14/2021    ECHOCARDIOGRAM LIMITED REPORT   Patient Name:   NEYA CREEGAN Date of Exam: 12/14/2021 Medical Rec #:  [id number]            Height:       69.0 in Accession #:    [id number]           Weight:       300.0 lb Date of Birth:  Jun 16, 1945           BSA:          2.455 m Patient Age:    70 years             BP:           117/81 mmHg Patient Gender: F                    HR:           85 bpm. Exam Location:  Inpatient Procedure: Limited Echo, Color Doppler and Cardiac Doppler Indications:    pericardial effusion and CHF  History:        Patient has prior history of Echocardiogram  examinations, most                 recent 08/08/2020. CHF; Risk Factors:Diabetes and Hypertension.  Sonographer:    Harvie Junior Referring Phys: [id number] DAVID MANUEL ORTIZ  Sonographer Comments: Technically difficult study due to poor echo windows, suboptimal apical window, no subcostal window, suboptimal parasternal window and patient is obese. Image acquisition challenging due to patient body habitus and supine, pain and bleeding under breast. IMPRESSIONS  1. D shaped septum with severe cor pulmonale known . Left ventricular ejection fraction, by estimation, is 45 to 50%. The left ventricle has mildly decreased function. The left ventricle demonstrates global hypokinesis. There is mild left ventricular hypertrophy. Left ventricular diastolic parameters are indeterminate.  2. Patient has known severe RV failure on TTE done 08/08/20. Right ventricular systolic function is severely reduced. The right ventricular size is severely enlarged. There is moderately elevated pulmonary artery systolic pressure.  3. Left atrial size was moderately dilated.  4. A small pericardial effusion is present. The pericardial effusion is lateral to the left ventricle and posterior to the left ventricle.  5. The mitral valve is abnormal. Trivial mitral valve regurgitation.  6. Tricuspid valve regurgitation is moderate. FINDINGS  Left Ventricle: D shaped septum with severe cor pulmonale known. Left ventricular ejection fraction, by estimation, is 45 to 50%. The left ventricle has mildly decreased function. The left ventricle demonstrates global hypokinesis. There is mild left ventricular hypertrophy. Left ventricular diastolic parameters are indeterminate. Right Ventricle: Patient has known severe RV failure on TTE done 08/08/20. The right ventricular size is severely enlarged.  Right vetricular wall thickness was not assessed. Right ventricular systolic function is severely reduced. There is moderately elevated pulmonary artery systolic  pressure. The tricuspid regurgitant velocity is 3.51 m/s, and with an assumed right atrial pressure of 8 mmHg, the estimated right ventricular systolic pressure is 53.6 mmHg. Left Atrium: Left atrial size was moderately dilated. Pericardium: A small pericardial effusion is present. The pericardial effusion is lateral to the left ventricle and posterior to the left ventricle. Mitral Valve: The mitral valve is abnormal. There is mild thickening of the mitral valve leaflet(s). There is mild calcification of the mitral valve leaflet(s). Mild mitral annular calcification. Trivial mitral valve regurgitation. Tricuspid Valve: Tricuspid valve regurgitation is moderate. Pulmonic Valve: Pulmonic valve regurgitation is mild. IAS/Shunts: The interatrial septum was not well visualized. LEFT VENTRICLE PLAX 2D LVIDd:         5.80 cm   Diastology LVIDs:         4.90 cm   LV e' medial:    7.62 cm/s LV PW:         1.30 cm   LV E/e' medial:  16.0 LV IVS:        1.30 cm   LV e' lateral:   5.98 cm/s LVOT diam:     1.90 cm   LV E/e' lateral: 20.3 LVOT Area:     2.84 cm  RIGHT VENTRICLE RV Basal diam:  5.40 cm RV Mid diam:    5.70 cm RV S prime:     8.27 cm/s LEFT ATRIUM           Index        RIGHT ATRIUM           Index LA diam:      4.80 cm 1.96 cm/m   RA Area:     21.90 cm LA Vol (A4C): 42.2 ml 17.19 ml/m  RA Volume:   70.70 ml  28.80 ml/m                        PULMONIC VALVE AORTA                 PV Vmax:          0.65 m/s Ao Root diam: 3.50 cm PV Peak grad:     1.7 mmHg Ao Asc diam:  3.50 cm PR End Diast Vel: 3.91 msec  MITRAL VALVE                TRICUSPID VALVE MV Area (PHT): 3.96 cm     TR Peak grad:   49.3 mmHg MV Decel Time: 192 msec     TR Vmax:        351.00 cm/s MR Peak grad: 16.2 mmHg MR Vmax:      201.00 cm/s   SHUNTS MV E velocity: 121.67 cm/s  Systemic Diam: 1.90 cm MV A velocity: 57.50 cm/s MV E/A ratio:  2.12 Jenkins Rouge MD Electronically signed by Jenkins Rouge MD Signature Date/Time: 12/14/2021/4:10:37 PM     Final    CT CHEST ABDOMEN PELVIS WO CONTRAST  Result Date: 12/13/2021 CLINICAL DATA:  Blunt poly trauma with hematuria. EXAM: CT CHEST, ABDOMEN AND PELVIS WITHOUT CONTRAST TECHNIQUE: Multidetector CT imaging of the chest, abdomen and pelvis was performed following the standard protocol without IV contrast. RADIATION DOSE REDUCTION: This exam was performed according to the departmental dose-optimization program which includes automated exposure control, adjustment of the mA and/or kV according to patient size and/or  use of iterative reconstruction technique. COMPARISON:  October 28, 2021 FINDINGS: CT CHEST FINDINGS Cardiovascular: Enlarged heart. Moderate in size pericardial effusion measuring up to 1.6 cm in the base of the heart. Calcific atherosclerotic disease of the aorta and coronary arteries. Mediastinum/Nodes: Borderline enlarged mediastinal lymph nodes. Example right paratracheal lymph node measures 1.4 cm in short axis. Partially calcified mildly enlarged lymph nodes in the bilateral hilar regions. Lungs/Pleura: Lungs are clear. No pleural effusion or pneumothorax. Scattered lung granulomas. Musculoskeletal: No chest wall mass or suspicious bone lesions identified. CT ABDOMEN PELVIS FINDINGS Hepatobiliary: No hepatic injury or perihepatic hematoma. Gallbladder is unremarkable Pancreas: Unremarkable. No pancreatic ductal dilatation or surrounding inflammatory changes. Spleen: Normal in size without focal abnormality. Small volume water density perisplenic ascites. Adrenals/Urinary Tract: Adrenal glands are unremarkable. Kidneys are normal, without renal calculi, focal lesion, or hydronephrosis. Bladder is unremarkable. Stomach/Bowel: Stomach is within normal limits. No evidence of appendicitis. No evidence of bowel wall thickening, distention, or inflammatory changes. Vascular/Lymphatic: Aortic atherosclerosis. No enlarged abdominal or pelvic lymph nodes. Reproductive: Post hysterectomy. The right adnexa  is enlarged measuring 4.2 x 4.3 cm Other: Diffuse anasarca. Musculoskeletal: Spondylosis of the lumbosacral spine. IMPRESSION: 1. Small volume water density perisplenic free fluid. Given the presence of pericardial effusion and anasarca this may represent reactive effusion, however in the settings of trauma, and given lack of IV contrast, splenic injury cannot be entirely excluded. 2. Enlarged heart with moderate in size pericardial effusion measuring up to 1.6 cm in the base of the heart. While pericardial effusion was demonstrated demonstrated on patient's prior CT exams, it has enlarged on today's exam. 3. Diffuse anasarca. 4. Right adnexal mass measuring 4.2 x 4.3 cm. Further evaluation with nonemergent pelvic ultrasound may be considered. Aortic Atherosclerosis (ICD10-I70.0). Electronically Signed   By: Fidela Salisbury M.D.   On: 12/13/2021 16:54   DG Knee Complete 4 Views Right  Result Date: 12/13/2021 CLINICAL DATA:  Slid out of bed at 3 a.m. Bruising to the right knee. EXAM: RIGHT KNEE - COMPLETE 4+ VIEW COMPARISON:  None Available. FINDINGS: Right knee is located. Joint spaces are preserved. No acute osseous abnormality is present. No significant effusion is present. Prepatellar soft tissue swelling is present. IMPRESSION: Prepatellar soft tissue swelling. No acute osseous abnormality. Electronically Signed   By: San Morelle M.D.   On: 12/13/2021 14:52        Scheduled Meds:  allopurinol  300 mg Oral q AM   insulin aspart  0-15 Units Subcutaneous TID WC   levothyroxine  88 mcg Oral QAC breakfast   magnesium oxide  400 mg Oral BID   metoprolol tartrate  12.5 mg Oral BID   nystatin   Topical TID   pregabalin  50 mg Oral BID   Continuous Infusions:  sodium chloride 10 mL/hr at 12/14/21 1700   cefTRIAXone (ROCEPHIN)  IV     furosemide       LOS: 1 day        Hosie Poisson, MD [facility]   To contact the attending provider between 7A-7P or the covering  provider during after hours 7P-7A, please log into the web site [email or web address] and access using universal [place] password for that web site. If you do not have the password, please call the hospital operator.  12/15/2021, 1:35 PM

## 2021-12-15 NOTE — Consult Note (Addendum)
Cardiology Consultation   Patient ID: Kristine [name] MRN: [id number]; DOB: [birth year]  Admit date: 12/13/2021 Date of Consult: 12/15/2021  PCP:  Kristine [name], [place] [facility] Cardiologist:  Kristine Bickers, MD        Patient Profile:   Kristine [name] is a 76 y.o. female with a hx of chronic HFpEF with cor pulmonale, pulmonary HTN, permanent atrial fibrillation, HTN, HLD, DM2 c/b Charcot foot, CKD stage IV, RUE DVT 10/2021, h/o bacteremia/septic shock 10/2021, progressive polyneuropathy including h/o carpal tunnel syndrome s/p CRT and spinal stenosis, anxiety, amiodarone toxicity, GERD, hiatal hernia, morbid obesity, OSA, thyroid disease, vitamin D deficiency who is being seen 12/15/2021 for the evaluation of anasarca at the request of Dr. Olevia [name].  History of Present Illness:   Kristine [name] was previously followed by Kristine [name] but more recently the [facility]. She has a long history of diastolic HF, pulm HTN, AFib - had been on multiple AADs in the past but now has chronic AFib. Remote nuc 2017 was normal. She established with Dr. 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 Diuretics were increased and strongly suggested compliance with CPAP and weight loss. She had f/u scheduled 02/2021 but did not show and was lost to follow-up. She has had several admissions since that time. She was admitted 6/23 for a/c CHF w/ mainly right sided symptoms, in the setting of recent dose reduction in diuretics due to worsening renal function. Echo showed normal LVEF 55-60%, RV severely enlarged w/ severely reduced systolic function and severely elevated RVSP 60 mmgH, mild-mod TR. No MR.  She responded well to IV diuresis and metolazone>>transitioned to PO lasix '120mg'$  BID and metolazone 2.5 mg M, W, F. SCr remained ~2.5 c/w baseline. Seen in [facility] 7/23, amyloid work up started. PYP 8/23 equivocal. She was readmitted 11/12/21 with a/c CHF and septic shock (group b  streptococcal bacteremia). Dr. Haroldine [name] performed TEE showing LVEF 55%, markedly HK RV, severe TR, no endocarditis, moderate pericardial effusion/no tamponade. Continued with IV diuresis. ID consulted, started on IV Anfel-->cefadroxil. Source felt to be septic knee vs left plantar diabetic foot ulcer. Also found to have + DVT RUE, on low-dose Eliquis but increased to 5 bid. GDMT limited by worsening SCr, but able to transition to Lasix 80 bid and remained off metolazone. Discharged weight 296 lbs. She was doing OK at f/u 11/27/21 though weight 303lb therefore metolazone 2.'5mg'$  / 33mq KCl were re-added once a week , with consideration of bumex down the road (allergic to torsemide). Of note, she is not on SGLT2i given frequent groin yeast infections, ARNI/MRA given CKD, no UNNA boots due to DFU & Charcot foot, and was awaiting new machine for CPAP - prior OSA f/u's were cancelled as she was hospitalized when they were scheduled.  [facility] arm bleeding after she slid off the bed hitting her arm on the end table. EMS was called to help get the patient off the floor. She was found to have UTI and hematuria so per notes, ER/urology holding Eliquis and treating with abx. Given blunt trauma on Eliquis, had abd/pelvic CT showing:  1. Small volume water density perisplenic free fluid. Given the presence of pericardial effusion and anasarca this may represent reactive effusion, however in the settings of trauma, and given lack of IV contrast, splenic injury cannot be entirely excluded. 2. Enlarged heart with moderate in size pericardial effusion measuring up to 1.6 cm  in the base of the heart. While pericardial effusion was demonstrated demonstrated on patient's prior CT exams, it has enlarged on today's exam. 3. Diffuse anasarca. 4. Right adnexal mass measuring 4.2 x 4.3 cm. Further evaluation with nonemergent pelvic ultrasound may be considered.   Other issues include hypoglycemia  and volume overload - received '40mg'$  + '40mg'$  of Lasix on 9/23, '80mg'$  + '60mg'$  on 9/24, and written for '60mg'$  BID today with intermittent potassium repletion as well. F/u limited echo yesterday showed D shaped septum with severe cor pulmonale known, EF 45-50%, mild LVH, severe RV failure/enlargement, moderately elevated PASP, small pericardial effusion, moderate LAE, moderate TR. She has been intermittently hpoxic into the 80s and is receiving supplemental O2. Cardiology asked to consult. K 3.9, Cr 2.35->2.13, TBili 1.3, WBC 10.6, Hgb 10.9, plt OK, Mg OK. She denies any SOB today. Legs look chronically edematous. Reports good UOP. No weight since 9/23 available. Strict I/O's not ordered but what is tracked shows -1.4L and she is now on RA.  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   Anxiety    Arthritis    Asthma    CAP (community acquired pneumonia) 05/31/2015   Cataract    Chronic bronchitis (HCC)    Chronic heart failure with preserved ejection fraction (HFpEF) ([place])    Chronic kidney disease (CKD), stage IV (severe) (HCC)    Clotting disorder ([place])    Community acquired pneumonia 05/31/2015   Cor pulmonale (chronic) (HCC)    Depression    GERD (gastroesophageal reflux disease)    High cholesterol    History of hiatal hernia    HTN (hypertension)    Mild aortic sclerosis    Morbid obesity ([place])    Neuromuscular disorder ([place])    Neuropathic pain    Osteoporosis    Permanent atrial fibrillation ([place])    Scoliosis    Sleep apnea    Spinal stenosis    Thyroid disease    Type II diabetes mellitus ([place])    Vitamin D deficiency     Past Surgical History:  Procedure Laterality Date   BLADDER SUSPENSION  1980s   CARDIAC CATHETERIZATION  11/16/09   SMOOTH AND NORMAL   CARDIOVERSION N/A 01/07/2015   Procedure: CARDIOVERSION;  Surgeon: Skeet Latch, MD;  Location: [place];  Service: Cardiovascular;  Laterality: N/A;   CARDIOVERSION N/A  03/11/2015   Procedure: CARDIOVERSION;  Surgeon: Skeet Latch, MD;  Location: [place];  Service: Cardiovascular;  Laterality: N/A;   CARPAL TUNNEL RELEASE Left 1970s   CATARACT EXTRACTION W/ INTRAOCULAR LENS  IMPLANT, BILATERAL Bilateral ~ 2010   COLONOSCOPY  08/14/08   FINGER FRACTURE SURGERY Right 1970s   "ring finger"   FRACTURE SURGERY     HEMATOMA EVACUATION Left 05/21/2021   Procedure: EVACUATION LEFT KNEE HEMATOMA;  Surgeon: Lennice Sites, MD;  Location: [place];  Service: Plastics;  Laterality: Left;   IR US GUIDE BX ASP/DRAIN  10/31/2021   LAPAROSCOPIC CHOLECYSTECTOMY  1980s   TEE WITHOUT CARDIOVERSION N/A 11/03/2021   Procedure: TRANSESOPHAGEAL ECHOCARDIOGRAM (TEE);  Surgeon: Jolaine Artist, MD;  Location: [place];  Service: Cardiovascular;  Laterality: N/A;   TUBAL LIGATION     VAGINAL HYSTERECTOMY  1980     Home Medications:  Prior to Admission medications   Medication Sig Start Date End Date Taking? Authorizing Provider  allopurinol (ZYLOPRIM) 300 MG tablet Take 300 mg by mouth daily.   Yes [provider]  apixaban (  ELIQUIS) 5 MG TABS tablet Take 1 tablet (5 mg total) by mouth 2 (two) times daily. 11/10/21  Yes Ghimire, Henreitta Leber, MD  insulin degludec (TRESIBA FLEXTOUCH) 200 UNIT/ML FlexTouch Pen Inject 26 Units into the skin daily. Patient taking differently: Inject 25 Units into the skin daily before breakfast. 11/10/21  Yes Ghimire, Henreitta Leber, MD  metoprolol tartrate (LOPRESSOR) 25 MG tablet Take 0.5 tablets (12.5 mg total) by mouth 2 (two) times daily. 11/10/21  Yes Ghimire, Henreitta Leber, MD  acetaminophen (TYLENOL) 500 MG tablet Take 1,000 mg by mouth every 6 (six) hours as needed for moderate pain or headache.    [provider]  ALPRAZolam Duanne Moron) 0.5 MG tablet Take 0.5 mg by mouth every 6 (six) hours as needed for anxiety.    [provider]  BD PEN NEEDLE NANO U/F 32G X 4 MM MISC Inject 1 each as directed See admin instructions.  Use pen needles with insulin pens daily 12/24/14   [provider]  diclofenac Sodium (VOLTAREN) 1 % GEL Apply 4 g topically 4 (four) times daily as needed (back pain). 09/19/21   Barton Dubois, MD  docusate sodium (COLACE) 100 MG capsule Take 100 mg by mouth daily as needed for mild constipation.    [provider]  DULoxetine (CYMBALTA) 30 MG capsule Take 30 mg by mouth in the morning. 09/26/20   [provider]  estradiol (ESTRACE) 0.1 MG/GM vaginal cream Place 1 Applicatorful vaginally as needed (vaginal irritation). 12/18/16   [provider]  furosemide (LASIX) 80 MG tablet Take 1 tablet (80 mg total) by mouth 2 (two) times daily. 11/11/21   Ghimire, Henreitta Leber, MD  insulin aspart (NOVOLOG FLEXPEN) 100 UNIT/ML FlexPen Inject 40-70 Units into the skin 3 (three) times daily with meals. Per sliding scale:Less than 150 units 150=40 units151-199=45 units 200-249=50 units 250-299=55 units 300-349=60 units    [provider]  levothyroxine (SYNTHROID) 88 MCG tablet Take 88 mcg by mouth every morning. 08/14/21   [provider]  lidocaine-prilocaine (EMLA) cream 1 gram qid prn Patient taking differently: Apply 1 Application topically once. 01/07/21   Marcial Pacas, MD  LYRICA 50 MG capsule Take 50 mg by mouth 2 (two) times daily. 06/01/17   [provider]  metolazone (ZAROXOLYN) 2.5 MG tablet Take 1 tablet (2.5 mg total) by mouth once a week. Take 1 tablet every Thursdays 11/27/21 05/14/22  Rafael Bihari, FNP  nystatin (MYCOSTATIN/NYSTOP) powder Apply 1 application  topically daily as needed (skin irritation/rash). 01/13/19   [provider]  ondansetron (ZOFRAN) 4 MG tablet Take 1 tablet (4 mg total) by mouth every 8 (eight) hours as needed for nausea or vomiting. 05/21/21   Scheeler, Carola Rhine, PA-C  Polyethyl Glycol-Propyl Glycol (SYSTANE OP) Place 1 drop into both eyes 2 (two) times daily as needed (dry eyes).    [provider]   polyethylene glycol (MIRALAX / GLYCOLAX) 17 g packet Take 17 g by mouth daily as needed for mild constipation.    [provider]  potassium chloride SA (KLOR-CON M) 20 MEQ tablet Take 2 tablets (40 mEq total) by mouth once a week. Take 2 tablets once weekly on Thursdays 11/27/21   Rafael Bihari, FNP  REGRANEX 0.01 % gel Apply 1 Application topically daily as needed for wound care. Apply to left foot 09/09/21   [provider]  traMADol (ULTRAM) 50 MG tablet Take 50 mg by mouth 3 (three) times daily as needed (severe neuropathy pain.).  06/24/19   [provider]  Vitamin D, Ergocalciferol, (DRISDOL) 50000 UNITS CAPS Take 50,000 Units by mouth every Monday.    [provider]    Inpatient Medications: Scheduled Meds:  allopurinol  300 mg Oral q AM   furosemide  60 mg Intravenous BID   insulin aspart  0-15 Units Subcutaneous TID WC   levothyroxine  88 mcg Oral QAC breakfast   magnesium oxide  400 mg Oral BID   metoprolol tartrate  12.5 mg Oral BID   nystatin   Topical TID   pregabalin  50 mg Oral BID   Continuous Infusions:  sodium chloride 10 mL/hr at 12/14/21 1700   cefTRIAXone (ROCEPHIN)  IV     PRN Meds: sodium chloride, acetaminophen **OR** acetaminophen, ALPRAZolam, dextrose, docusate sodium, ondansetron **OR** ondansetron (ZOFRAN) IV, traMADol  Allergies:    Allergies  Allergen Reactions   Albuterol Other (See Comments)    REACTION: Tachycardia- AFib   Epinephrine Other (See Comments)    Increases heart rate per patient.    Penicillins Other (See Comments)    REACTION: flushing' \\T'$ \ hot  Did it involve swelling of the face/tongue/throat, SOB, or low BP? No Did it involve sudden or severe rash/hives, skin peeling, or any reaction on the inside of your mouth or nose? No Did you need to seek medical attention at a hospital or doctor's office? No When did it last happen?    20 years ago   If all above answers are "NO", may proceed with  cephalosporin use.   UPDATE 11/05/21: Tolerated Cefazolin 10/29/21 to 11/05/21, switched to oral cefadroxil 11/05/21.    Ceftriaxone Other (See Comments)    Sweats.   UPDATE 11/05/21: Tolerated Cefazolin 10/29/21 to 11/05/21, switched to oral cefadroxil 11/05/21.   Citalopram Hydrobromide Other (See Comments)    Prolonged QT prolongation   Exenatide Other (See Comments)    Unknown reaction   Ketorolac Tromethamine Hives and Swelling    swelling in hands   Tape Other (See Comments)    PLEASE USE EASY-RELEASE WRAP- TAPE TEARS THE SKIN   Tizanidine Other (See Comments)    lethargic   Torsemide Swelling   Zoledronic Acid Other (See Comments)    Pt was hospitalized due to medication   Bee Venom Other (See Comments)    "makes me nervous"   Iodinated Contrast Media Other (See Comments)    flushing Other reaction(s): Not available   Keflex [Cephalexin] Other (See Comments)    Makes her feel warm; she was told never to take it.    UPDATE 11/05/21: Tolerated Cefazolin 10/29/21 to 11/05/21, switched to oral cefadroxil 11/05/21.     Social History:   Social History   Socioeconomic History   Marital status: Married    Spouse name: Kristine [name]   Number of children: 4   Years of education: 14   Highest education level: Bachelor's degree (e.g., BA, AB, BS)  Occupational History   Occupation: Retired  Tobacco Use   Smoking status: Never   Smokeless tobacco: Never  Vaping Use   Vaping Use: Never used  Substance and Sexual Activity   Alcohol use: No   Drug use: No   Sexual activity: Yes  Other Topics Concern   Not on file  Social History Narrative   [place] lives in [place] with [place].   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  Right-handed.   1 cup caffeine per day.   Social Determinants of Health   Financial Resource Strain: Low Risk  (09/12/2021)   Overall Financial Resource Strain (CARDIA)     Difficulty of Paying Living Expenses: Not very hard  Food Insecurity: No Food Insecurity (12/15/2021)   Hunger Vital Sign    Worried About Running Out of Food in the Last Year: Never true    Ran Out of Food in the Last Year: Never true  Transportation Needs: No Transportation Needs (12/15/2021)   PRAPARE - Hydrologist (Medical): No    Lack of Transportation (Non-Medical): No  Physical Activity: Not on file  Stress: Not on file  Social Connections: Not on file  Intimate Partner Violence: Not At Risk (12/15/2021)   Humiliation, Afraid, Rape, and Kick questionnaire    Fear of Current or Ex-Partner: No    Emotionally Abused: No    Physically Abused: No    Sexually Abused: No    Family History:    Family History  Problem Relation Age of Onset   Stroke Father    Hypertension Father    Atrial fibrillation Father        HAD MURMUR   Heart failure Mother    Congestive Heart Failure Mother    Hypertension Mother    Hypertension Brother    Hypertension Sister    Hypertension Son    CAD Sister        EARLY   CAD Brother        EARLY   Colon cancer Neg Hx    Esophageal cancer Neg Hx    Stomach cancer Neg Hx    Rectal cancer Neg Hx      ROS:  Please see the history of present illness.   All other ROS reviewed and negative.     Physical Exam/Data:   Vitals:   12/14/21 2100 12/15/21 0024 12/15/21 0644 12/15/21 0707  BP: 115/66 (!) 126/92 121/74   Pulse: 99 100 100   Resp: '19 19 19   '$ Temp: 98.3 F (36.8 C) 98 F (36.7 C) 97.6 F (36.4 C)   TempSrc:  Oral Oral   SpO2: 97% 95% (!) 88% 94%  Weight:      Height:        Intake/Output Summary (Last 24 hours) at 12/15/2021 0848 Last data filed at 12/15/2021 0846 Gross per 24 hour  Intake 793.04 ml  Output 1000 ml  Net -206.96 ml      12/13/2021   12:38 PM 11/27/2021    1:50 PM 11/06/2021    5:00 AM  Last 3 Weights  Weight (lbs) 300 lb 304 lb 9.6 oz 297 lb 8 oz  Weight (kg) 136.079 kg  138.166 kg 134.945 kg     Body mass index is 44.3 kg/m.  General: Morbidly obese chronically ill in no acute distress. Head: Normocephalic, atraumatic, sclera non-icteric, no xanthomas, nares are without discharge. Neck: Negative for carotid bruits. JVP not elevated. Lungs: Clear bilaterally to auscultation without wheezes, rales, or rhonchi. Breathing is unlabored. Heart: Irregularly irregular, rate controlled S1 S2. Soft SEM LSB. No rubs or gallops.  Abdomen: Soft, non-tender, rounded with normoactive bowel sounds. No rebound/guarding. Extremities: No clubbing or cyanosis. 1+ BLE with chronic venous stasis changes Neuro: Alert and oriented X 3. Moves all extremities spontaneously. Psych:  Responds to questions appropriately with a normal affect.   EKG:  The EKG was personally reviewed and demonstrates:  atrial fib 92bpm,  one PVC, low voltage, QTC reported as 558 but visually appears shorter and re-calculated 396-492m  Telemetry:  Telemetry was personally reviewed and demonstrates:  afib rates 90s-low 100s, occ PVC  Relevant CV Studies: 2D echo 12/14/21   1. D shaped septum with severe cor pulmonale known . Left ventricular  ejection fraction, by estimation, is 45 to 50%. The left ventricle has  mildly decreased function. The left ventricle demonstrates global  hypokinesis. There is mild left ventricular  hypertrophy. Left ventricular diastolic parameters are indeterminate.   2. Patient has known severe RV failure on TTE done 08/08/20. Right  ventricular systolic function is severely reduced. The right ventricular  size is severely enlarged. There is moderately elevated pulmonary artery  systolic pressure.   3. Left atrial size was moderately dilated.   4. A small pericardial effusion is present. The pericardial effusion is  lateral to the left ventricle and posterior to the left ventricle.   5. The mitral valve is abnormal. Trivial mitral valve regurgitation.   6. Tricuspid valve  regurgitation is moderate.   Venous duplex 11/03/21    Right:  No evidence of superficial vein thrombosis in the upper extremity.  Findings  consistent with acute deep vein thrombosis involving the right axillary  vein and  right brachial veins.     Left:  No evidence of thrombosis in the subclavian.      NST 2017 The left ventricular ejection fraction is normal (55-65%). Nuclear stress EF: 59%. There was no ST segment deviation noted during stress. This is a low risk study.  Laboratory Data:  High Sensitivity Troponin:  No results for input(s): "TROPONINIHS" in the last 720 hours.   Chemistry Recent Labs  Lab 12/13/21 1433 12/14/21 1323 12/15/21 0357  NA 137 137 139  K 2.9* 3.5 3.9  CL 97* 98 101  CO2 '30 30 28  '$ GLUCOSE 52* 173* 177*  BUN 66* 66* 66*  CREATININE 2.35* 2.34* 2.13*  CALCIUM 9.6 9.5 9.4  MG 2.4 2.4  --   GFRNONAA 21* 21* 24*  ANIONGAP '10 9 10    '$ Recent Labs  Lab 12/13/21 1433 12/14/21 1323 12/15/21 0357  PROT 7.1 6.8 6.5  ALBUMIN 3.8 3.5 3.5  AST '20 16 18  '$ ALT '9 9 9  '$ ALKPHOS 91 86 81  BILITOT 1.6* 1.5* 1.3*   Lipids No results for input(s): "CHOL", "TRIG", "HDL", "LABVLDL", "LDLCALC", "CHOLHDL" in the last 168 hours.  Hematology Recent Labs  Lab 12/13/21 1433 12/14/21 1323 12/15/21 0357  WBC 11.5* 10.4 10.6*  RBC 4.03 3.66* 3.67*  HGB 11.0* 10.1* 10.1*  HCT 35.9* 33.5* 33.6*  MCV 89.1 91.5 91.6  MCH 27.3 27.6 27.5  MCHC 30.6 30.1 30.1  RDW 18.3* 18.2* 18.5*  PLT 159 141* 159   Thyroid No results for input(s): "TSH", "FREET4" in the last 168 hours.  BNPNo results for input(s): "BNP", "PROBNP" in the last 168 hours.  DDimer No results for input(s): "DDIMER" in the last 168 hours.   Radiology/Studies:  ECHOCARDIOGRAM LIMITED  Result Date: 12/14/2021    ECHOCARDIOGRAM LIMITED REPORT   Patient Name:   BMARTYNA THORNSDate of Exam: 12/14/2021 Medical Rec #:  [id number]           Height:       69.0 in Accession #:    [id number]           Weight:       300.0 lb Date of Birth:  [birth year]  BSA:          2.455 m Patient Age:    13 years             BP:           117/81 mmHg Patient Gender: F                    HR:           85 bpm. Exam Location:  Inpatient Procedure: Limited Echo, Color Doppler and Cardiac Doppler Indications:    pericardial effusion and CHF  History:        Patient has prior history of Echocardiogram examinations, most                 recent 08/08/2020. CHF; Risk Factors:Diabetes and Hypertension.  Sonographer:    Harvie Junior Referring Phys: [id number] DAVID MANUEL ORTIZ  Sonographer Comments: Technically difficult study due to poor echo windows, suboptimal apical window, no subcostal window, suboptimal parasternal window and patient is obese. Image acquisition challenging due to patient body habitus and supine, pain and bleeding under breast. IMPRESSIONS  1. D shaped septum with severe cor pulmonale known . Left ventricular ejection fraction, by estimation, is 45 to 50%. The left ventricle has mildly decreased function. The left ventricle demonstrates global hypokinesis. There is mild left ventricular hypertrophy. Left ventricular diastolic parameters are indeterminate.  2. Patient has known severe RV failure on TTE done 08/08/20. Right ventricular systolic function is severely reduced. The right ventricular size is severely enlarged. There is moderately elevated pulmonary artery systolic pressure.  3. Left atrial size was moderately dilated.  4. A small pericardial effusion is present. The pericardial effusion is lateral to the left ventricle and posterior to the left ventricle.  5. The mitral valve is abnormal. Trivial mitral valve regurgitation.  6. Tricuspid valve regurgitation is moderate. FINDINGS  Left Ventricle: D shaped septum with severe cor pulmonale known. Left ventricular ejection fraction, by estimation, is 45 to 50%. The left ventricle has mildly decreased function. The left ventricle demonstrates  global hypokinesis. There is mild left ventricular hypertrophy. Left ventricular diastolic parameters are indeterminate. Right Ventricle: Patient has known severe RV failure on TTE done 08/08/20. The right ventricular size is severely enlarged. Right vetricular wall thickness was not assessed. Right ventricular systolic function is severely reduced. There is moderately elevated pulmonary artery systolic pressure. The tricuspid regurgitant velocity is 3.51 m/s, and with an assumed right atrial pressure of 8 mmHg, the estimated right ventricular systolic pressure is 61.4 mmHg. Left Atrium: Left atrial size was moderately dilated. Pericardium: A small pericardial effusion is present. The pericardial effusion is lateral to the left ventricle and posterior to the left ventricle. Mitral Valve: The mitral valve is abnormal. There is mild thickening of the mitral valve leaflet(s). There is mild calcification of the mitral valve leaflet(s). Mild mitral annular calcification. Trivial mitral valve regurgitation. Tricuspid Valve: Tricuspid valve regurgitation is moderate. Pulmonic Valve: Pulmonic valve regurgitation is mild. IAS/Shunts: The interatrial septum was not well visualized. LEFT VENTRICLE PLAX 2D LVIDd:         5.80 cm   Diastology LVIDs:         4.90 cm   LV e' medial:    7.62 cm/s LV PW:         1.30 cm   LV E/e' medial:  16.0 LV IVS:        1.30 cm   LV e' lateral:   5.98 cm/s LVOT  diam:     1.90 cm   LV E/e' lateral: 20.3 LVOT Area:     2.84 cm  RIGHT VENTRICLE RV Basal diam:  5.40 cm RV Mid diam:    5.70 cm RV S prime:     8.27 cm/s LEFT ATRIUM           Index        RIGHT ATRIUM           Index LA diam:      4.80 cm 1.96 cm/m   RA Area:     21.90 cm LA Vol (A4C): 42.2 ml 17.19 ml/m  RA Volume:   70.70 ml  28.80 ml/m                        PULMONIC VALVE AORTA                 PV Vmax:          0.65 m/s Ao Root diam: 3.50 cm PV Peak grad:     1.7 mmHg Ao Asc diam:  3.50 cm PR End Diast Vel: 3.91 msec  MITRAL  VALVE                TRICUSPID VALVE MV Area (PHT): 3.96 cm     TR Peak grad:   49.3 mmHg MV Decel Time: 192 msec     TR Vmax:        351.00 cm/s MR Peak grad: 16.2 mmHg MR Vmax:      201.00 cm/s   SHUNTS MV E velocity: 121.67 cm/s  Systemic Diam: 1.90 cm MV A velocity: 57.50 cm/s MV E/A ratio:  2.12 Jenkins Rouge MD Electronically signed by Jenkins Rouge MD Signature Date/Time: 12/14/2021/4:10:37 PM    Final    CT CHEST ABDOMEN PELVIS WO CONTRAST  Result Date: 12/13/2021 CLINICAL DATA:  Blunt poly trauma with hematuria. EXAM: CT CHEST, ABDOMEN AND PELVIS WITHOUT CONTRAST TECHNIQUE: Multidetector CT imaging of the chest, abdomen and pelvis was performed following the standard protocol without IV contrast. RADIATION DOSE REDUCTION: This exam was performed according to the departmental dose-optimization program which includes automated exposure control, adjustment of the mA and/or kV according to patient size and/or use of iterative reconstruction technique. COMPARISON:  October 28, 2021 FINDINGS: CT CHEST FINDINGS Cardiovascular: Enlarged heart. Moderate in size pericardial effusion measuring up to 1.6 cm in the base of the heart. Calcific atherosclerotic disease of the aorta and coronary arteries. Mediastinum/Nodes: Borderline enlarged mediastinal lymph nodes. Example right paratracheal lymph node measures 1.4 cm in short axis. Partially calcified mildly enlarged lymph nodes in the bilateral hilar regions. Lungs/Pleura: Lungs are clear. No pleural effusion or pneumothorax. Scattered lung granulomas. Musculoskeletal: No chest wall mass or suspicious bone lesions identified. CT ABDOMEN PELVIS FINDINGS Hepatobiliary: No hepatic injury or perihepatic hematoma. Gallbladder is unremarkable Pancreas: Unremarkable. No pancreatic ductal dilatation or surrounding inflammatory changes. Spleen: Normal in size without focal abnormality. Small volume water density perisplenic ascites. Adrenals/Urinary Tract: Adrenal glands are  unremarkable. Kidneys are normal, without renal calculi, focal lesion, or hydronephrosis. Bladder is unremarkable. Stomach/Bowel: Stomach is within normal limits. No evidence of appendicitis. No evidence of bowel wall thickening, distention, or inflammatory changes. Vascular/Lymphatic: Aortic atherosclerosis. No enlarged abdominal or pelvic lymph nodes. Reproductive: Post hysterectomy. The right adnexa is enlarged measuring 4.2 x 4.3 cm Other: Diffuse anasarca. Musculoskeletal: Spondylosis of the lumbosacral spine. IMPRESSION: 1. Small volume water density perisplenic free fluid. Given the presence  of pericardial effusion and anasarca this may represent reactive effusion, however in the settings of trauma, and given lack of IV contrast, splenic injury cannot be entirely excluded. 2. Enlarged heart with moderate in size pericardial effusion measuring up to 1.6 cm in the base of the heart. While pericardial effusion was demonstrated demonstrated on patient's prior CT exams, it has enlarged on today's exam. 3. Diffuse anasarca. 4. Right adnexal mass measuring 4.2 x 4.3 cm. Further evaluation with nonemergent pelvic ultrasound may be considered. Aortic Atherosclerosis (ICD10-I70.0). Electronically Signed   By: Fidela Salisbury M.D.   On: 12/13/2021 16:54   DG Knee Complete 4 Views Right  Result Date: 12/13/2021 CLINICAL DATA:  Slid out of bed at 3 a.m. Bruising to the right knee. EXAM: RIGHT KNEE - COMPLETE 4+ VIEW COMPARISON:  None Available. FINDINGS: Right knee is located. Joint spaces are preserved. No acute osseous abnormality is present. No significant effusion is present. Prepatellar soft tissue swelling is present. IMPRESSION: Prepatellar soft tissue swelling. No acute osseous abnormality. Electronically Signed   By: San Morelle M.D.   On: 12/13/2021 14:52     Assessment and Plan:   1. Fall, left knee continusion - required EMS assistance to get up which can be generally poor prognostic  indicator - per CT "Small volume water density perisplenic free fluid. Given the presence of pericardial effusion and anasarca this may represent reactive effusion, however in the settings of trauma, and given lack of IV contrast, splenic injury cannot be entirely excluded."  -> per medicine team - incidentally found to have 4.2x4.3cm adnexal mass which will require eventual w/u as well  2. Anasarca, acute on chronic HFmrEF, cor pulmonale, pulm HTN - prior PYP scan equivocal for amyloid - EF variable in the past from 45-60%, here 45-50% - has not been on CPAP, was hospitalized at time of last f/u per husband - will review med recs with MD - currently on Lasix '60mg'$  IV BID - fluid restrict to 1874m, ordered daily weights and strict I/O's  3. CKD stage IV, hypokalemia - Cr has been as high as 3 recently, appears stable at 2.13 today - recommend IM team keep K 4.0 or greater, Mg 2.0 or greater  4. Permanent atrial fibrillation, also note h/o RUE DVT 10/2021, occ PVCs/brief 6 beat NSVT on telemetry (asymptomatic) - Eliquis held but relayed to medicine team as FYI to note that she is also on this for recent DVT as well, not just AFib - Dr. AKarleen Hampshirewill reassess today and plan for resumption - otherwise HR 90s-low 100s on Lopressor 12.'5mg'$  BID, had softer BP earlier this admission but improved now - K 3.9, last Mg 2.4 - will review with MD  5. Hematuria/UTI - per primary team  6. Pericardial effusion - small on limited echo, not felt to be of HD significance at this time   Risk Assessment/Risk Scores:    New York Heart Association (NYHA) Functional Class NYHA Class III  CHA2DS2-VASc Score = 8   This indicates a 10.8% annual risk of stroke. The patient's score is based upon: CHF History: 1 HTN History: 1 Diabetes History: 1 Stroke History: 2 (thromboembolism) Vascular Disease History: 1 (aortic atherosclerosis) Age Score: 1 Gender Score: 1      For questions or updates, please  contact [place]Please consult [email or web address] for contact info under    Signed, DCharlie Pitter PA-C  12/15/2021 8:48 AM

## 2021-12-15 NOTE — Consult Note (Signed)
WOC Nurse Consult Note: Patient receiving care in [place]. Reason for Consult: care to right arm laceration Wound type: trauma injury. Per ED note, repaired with tissue adhesive on 12/13/21. Pressure Injury POA: NA Measurement: Wound bed: Drainage (amount, consistency, odor)  Periwound: Dressing procedure/placement/frequency: Saturate existing dressing to right arm with saline in order to remove existing dressing without causing further damage. GENTLY pat dry with gauze. Place as many Telfa non-adherent pads as needed to cover the laceration area. Top with ABD pads, secure with kerlix. Change daily and prn soilage.  Monitor the wound area(s) for worsening of condition such as: Signs/symptoms of infection,  Increase in size,  Development of or worsening of odor, Development of pain, or increased pain at the affected locations.  Notify the medical team if any of these develop.  Thank you for the consult. [place] nurse will not follow at this time.  Please re-consult the [place] team if needed.  Val Riles, RN, MSN, CWOCN, CNS-BC, pager [phone]

## 2021-12-15 NOTE — Plan of Care (Signed)
  Problem: Coping: Goal: Level of anxiety will decrease Outcome: Progressing   Problem: Pain Managment: Goal: General experience of comfort will improve Outcome: Progressing   Problem: Safety: Goal: Ability to remain free from injury will improve Outcome: Progressing   Problem: Skin Integrity: Goal: Risk for impaired skin integrity will decrease Outcome: Progressing   

## 2021-12-16 ENCOUNTER — Inpatient Hospital Stay (HOSPITAL_COMMUNITY): Payer: Medicare HMO

## 2021-12-16 DIAGNOSIS — I4819 Other persistent atrial fibrillation: Secondary | ICD-10-CM | POA: Diagnosis not present

## 2021-12-16 DIAGNOSIS — Z7189 Other specified counseling: Secondary | ICD-10-CM | POA: Diagnosis not present

## 2021-12-16 DIAGNOSIS — I5042 Chronic combined systolic (congestive) and diastolic (congestive) heart failure: Secondary | ICD-10-CM | POA: Diagnosis not present

## 2021-12-16 DIAGNOSIS — R601 Generalized edema: Secondary | ICD-10-CM | POA: Diagnosis not present

## 2021-12-16 DIAGNOSIS — N184 Chronic kidney disease, stage 4 (severe): Secondary | ICD-10-CM | POA: Diagnosis not present

## 2021-12-16 DIAGNOSIS — W19XXXA Unspecified fall, initial encounter: Secondary | ICD-10-CM | POA: Diagnosis not present

## 2021-12-16 LAB — BASIC METABOLIC PANEL
Anion gap: 12 (ref 5–15)
BUN: 65 mg/dL — ABNORMAL HIGH (ref 8–23)
CO2: 26 mmol/L (ref 22–32)
Calcium: 9.5 mg/dL (ref 8.9–10.3)
Chloride: 101 mmol/L (ref 98–111)
Creatinine, Ser: 2.16 mg/dL — ABNORMAL HIGH (ref 0.44–1.00)
GFR, Estimated: 23 mL/min — ABNORMAL LOW (ref >60)
Glucose, Bld: 172 mg/dL — ABNORMAL HIGH (ref 70–99)
Potassium: 4.4 mmol/L (ref 3.5–5.1)
Sodium: 139 mmol/L (ref 135–145)

## 2021-12-16 LAB — CBC
HCT: 35.5 % — ABNORMAL LOW (ref 36.0–46.0)
Hemoglobin: 10.2 g/dL — ABNORMAL LOW (ref 12.0–15.0)
MCH: 28.4 pg (ref 26.0–34.0)
MCHC: 28.7 g/dL — ABNORMAL LOW (ref 30.0–36.0)
MCV: 98.9 fL (ref 80.0–100.0)
Platelets: 156 10*3/uL (ref 150–400)
RBC: 3.59 MIL/uL — ABNORMAL LOW (ref 3.87–5.11)
RDW: 19.1 % — ABNORMAL HIGH (ref 11.5–15.5)
WBC: 9.6 10*3/uL (ref 4.0–10.5)
nRBC: 0.3 % — ABNORMAL HIGH (ref 0.0–0.2)

## 2021-12-16 LAB — GLUCOSE, CAPILLARY
Glucose-Capillary: 247 mg/dL — ABNORMAL HIGH (ref 70–99)
Glucose-Capillary: 253 mg/dL — ABNORMAL HIGH (ref 70–99)
Glucose-Capillary: 191 mg/dL — ABNORMAL HIGH (ref 70–99)
Glucose-Capillary: 225 mg/dL — ABNORMAL HIGH (ref 70–99)

## 2021-12-16 LAB — APTT
aPTT: 34 seconds (ref 24–36)
aPTT: 90 seconds — ABNORMAL HIGH (ref 24–36)

## 2021-12-16 LAB — MAGNESIUM: Magnesium: 2.6 mg/dL — ABNORMAL HIGH (ref 1.7–2.4)

## 2021-12-16 LAB — HEPARIN LEVEL (UNFRACTIONATED): Heparin Unfractionated: 0.93 IU/mL — ABNORMAL HIGH (ref 0.30–0.70)

## 2021-12-16 MED ORDER — SODIUM CHLORIDE 0.9 % IV SOLN
2.0000 g | Freq: Three times a day (TID) | INTRAVENOUS | Status: DC
  Administered 2021-12-16 – 2021-12-18 (×5): 2 g via INTRAVENOUS
  Filled 2021-12-16 (×6): qty 2000

## 2021-12-16 MED ORDER — HEPARIN (PORCINE) 25000 UT/250ML-% IV SOLN
1250.0000 [IU]/h | INTRAVENOUS | Status: DC
  Administered 2021-12-16 – 2021-12-17 (×2): 1400 [IU]/h via INTRAVENOUS
  Filled 2021-12-16 (×2): qty 250

## 2021-12-16 MED ORDER — HEPARIN BOLUS VIA INFUSION
5000.0000 [IU] | Freq: Once | INTRAVENOUS | Status: AC
  Administered 2021-12-16: 5000 [IU] via INTRAVENOUS
  Filled 2021-12-16: qty 5000

## 2021-12-16 MED ORDER — METOLAZONE 5 MG PO TABS
5.0000 mg | ORAL_TABLET | Freq: Once | ORAL | Status: AC
  Administered 2021-12-16: 5 mg via ORAL
  Filled 2021-12-16: qty 1

## 2021-12-16 MED ORDER — INSULIN GLARGINE-YFGN 100 UNIT/ML ~~LOC~~ SOLN
5.0000 [IU] | Freq: Every day | SUBCUTANEOUS | Status: DC
  Administered 2021-12-17 – 2021-12-21 (×5): 5 [IU] via SUBCUTANEOUS
  Filled 2021-12-16 (×5): qty 0.05

## 2021-12-16 NOTE — Progress Notes (Signed)
Progress Note  Patient Name: Kristine [name] Date of Encounter: 12/16/2021  Primary Cardiologist: Glori Bickers, MD  Subjective   Patient sleeping when I entered, easily awoken. Pleasant affect. Denies CP, SOB, palpitations. "Feel pretty good.. Remains very edematous including RUE. This was the one with DVT last month.  Inpatient Medications    Scheduled Meds:  allopurinol  300 mg Oral q AM   insulin aspart  0-15 Units Subcutaneous TID WC   levothyroxine  88 mcg Oral QAC breakfast   magnesium oxide  400 mg Oral BID   metoprolol tartrate  12.5 mg Oral BID   nystatin   Topical TID   pregabalin  50 mg Oral BID   Continuous Infusions:  sodium chloride 10 mL/hr at 12/14/21 1700   cefTRIAXone (ROCEPHIN)  IV 2 g (12/15/21 1422)   furosemide 100 mg (12/15/21 1855)   PRN Meds: sodium chloride, acetaminophen **OR** acetaminophen, ALPRAZolam, dextrose, docusate sodium, ondansetron **OR** ondansetron (ZOFRAN) IV, traMADol   Vital Signs    Vitals:   12/15/21 1409 12/15/21 2051 12/16/21 0424 12/16/21 0634  BP: 128/76 117/81  121/77  Pulse: 78 83  89  Resp: '18 18  18  '$ Temp: (!) 97.4 F (36.3 C) 98 F (36.7 C)  98.5 F (36.9 C)  TempSrc: Oral Oral  Oral  SpO2: 98% 93%  99%  Weight:   (!) 139.8 kg   Height:        Intake/Output Summary (Last 24 hours) at 12/16/2021 0910 Last data filed at 12/16/2021 0730 Gross per 24 hour  Intake 804.59 ml  Output 750 ml  Net 54.59 ml      12/16/2021    4:24 AM 12/13/2021   12:38 PM 11/27/2021    1:50 PM  Last 3 Weights  Weight (lbs) 308 lb 3.3 oz 300 lb 304 lb 9.6 oz  Weight (kg) 139.8 kg 136.079 kg 138.166 kg     Telemetry    Atrial fib rates 80s-low 100s - Personally Reviewed  ECG    No new tracings required - Personally Reviewed  Physical Exam   GEN: Morbidly obese chronically ill appearing WF. No acute distress.  HEENT: Normocephalic, atraumatic, sclera non-icteric. Neck: No JVD or bruits. Cardiac: Irregularly  irregular, rate controlled 80s presently, soft SEM LSB. No rubs or gallops.  Respiratory: Clear to auscultation bilaterally. Breathing is unlabored. GI: Soft, nontender, non-distended, BS +x 4. MS: no deformity. Extremities: No clubbing or cyanosis. ++diffuse BLE edema and RLE edema as well. Extremities warm. Chronic venous stasis changes Neuro:  AAOx3. Follows commands. Psych:  Responds to questions appropriately with a normal affect.  Labs    High Sensitivity Troponin:  No results for input(s): "TROPONINIHS" in the last 720 hours.    Cardiac EnzymesNo results for input(s): "TROPONINI" in the last 168 hours. No results for input(s): "TROPIPOC" in the last 168 hours.   Chemistry Recent Labs  Lab 12/13/21 1433 12/14/21 1323 12/15/21 0357 12/16/21 0423  NA 137 137 139 139  K 2.9* 3.5 3.9 4.4  CL 97* 98 101 101  CO2 '30 30 28 26  '$ GLUCOSE 52* 173* 177* 172*  BUN 66* 66* 66* 65*  CREATININE 2.35* 2.34* 2.13* 2.16*  CALCIUM 9.6 9.5 9.4 9.5  PROT 7.1 6.8 6.5  --   ALBUMIN 3.8 3.5 3.5  --   AST '20 16 18  '$ --   ALT '9 9 9  '$ --   ALKPHOS 91 86 81  --   BILITOT 1.6* 1.5* 1.3*  --  GFRNONAA 21* 21* 24* 23*  ANIONGAP '10 9 10 12     '$ Hematology Recent Labs  Lab 12/14/21 1323 12/15/21 0357 12/16/21 0423  WBC 10.4 10.6* 9.6  RBC 3.66* 3.67* 3.59*  HGB 10.1* 10.1* 10.2*  HCT 33.5* 33.6* 35.5*  MCV 91.5 91.6 98.9  MCH 27.6 27.5 28.4  MCHC 30.1 30.1 28.7*  RDW 18.2* 18.5* 19.1*  PLT 141* 159 156    BNPNo results for input(s): "BNP", "PROBNP" in the last 168 hours.   DDimer No results for input(s): "DDIMER" in the last 168 hours.   Radiology    US PELVIS (TRANSABDOMINAL ONLY)  Result Date: 12/15/2021 CLINICAL DATA:  Adnexal mass EXAM: TRANSABDOMINAL ULTRASOUND OF PELVIS TECHNIQUE: Transabdominal ultrasound examination of the pelvis was performed including evaluation of the uterus, ovaries, adnexal regions, and pelvic cul-de-sac. COMPARISON:  CT chest abdomen and pelvis  12/13/2021. FINDINGS: Uterus Surgically absent. Right ovary Measurements: Not visulaized.  No adnexal mass. Left ovary Measurements: Not visulaized. No adnexal mass. Other findings:  No abnormal free fluid. IMPRESSION: 1. Right adnexal mass seen on CT is not visualized on this exam. Consider MRI for further evaluation. Electronically Signed   By: Ronney Asters M.D.   On: 12/15/2021 15:44   ECHOCARDIOGRAM LIMITED  Result Date: 12/14/2021    ECHOCARDIOGRAM LIMITED REPORT   Patient Name:   TYANNAH SANE Date of Exam: 12/14/2021 Medical Rec #:  [id number]            Height:       69.0 in Accession #:    [id number]           Weight:       300.0 lb Date of Birth:  1945-05-25           BSA:          2.455 m Patient Age:    60 years             BP:           117/81 mmHg Patient Gender: F                    HR:           85 bpm. Exam Location:  Inpatient Procedure: Limited Echo, Color Doppler and Cardiac Doppler Indications:    pericardial effusion and CHF  History:        Patient has prior history of Echocardiogram examinations, most                 recent 08/08/2020. CHF; Risk Factors:Diabetes and Hypertension.  Sonographer:    Harvie Junior Referring Phys: [id number] DAVID MANUEL ORTIZ  Sonographer Comments: Technically difficult study due to poor echo windows, suboptimal apical window, no subcostal window, suboptimal parasternal window and patient is obese. Image acquisition challenging due to patient body habitus and supine, pain and bleeding under breast. IMPRESSIONS  1. D shaped septum with severe cor pulmonale known . Left ventricular ejection fraction, by estimation, is 45 to 50%. The left ventricle has mildly decreased function. The left ventricle demonstrates global hypokinesis. There is mild left ventricular hypertrophy. Left ventricular diastolic parameters are indeterminate.  2. Patient has known severe RV failure on TTE done 08/08/20. Right ventricular systolic function is severely reduced. The right  ventricular size is severely enlarged. There is moderately elevated pulmonary artery systolic pressure.  3. Left atrial size was moderately dilated.  4. A small pericardial effusion is present. The pericardial effusion is lateral to the  left ventricle and posterior to the left ventricle.  5. The mitral valve is abnormal. Trivial mitral valve regurgitation.  6. Tricuspid valve regurgitation is moderate. FINDINGS  Left Ventricle: D shaped septum with severe cor pulmonale known. Left ventricular ejection fraction, by estimation, is 45 to 50%. The left ventricle has mildly decreased function. The left ventricle demonstrates global hypokinesis. There is mild left ventricular hypertrophy. Left ventricular diastolic parameters are indeterminate. Right Ventricle: Patient has known severe RV failure on TTE done 08/08/20. The right ventricular size is severely enlarged. Right vetricular wall thickness was not assessed. Right ventricular systolic function is severely reduced. There is moderately elevated pulmonary artery systolic pressure. The tricuspid regurgitant velocity is 3.51 m/s, and with an assumed right atrial pressure of 8 mmHg, the estimated right ventricular systolic pressure is 27.0 mmHg. Left Atrium: Left atrial size was moderately dilated. Pericardium: A small pericardial effusion is present. The pericardial effusion is lateral to the left ventricle and posterior to the left ventricle. Mitral Valve: The mitral valve is abnormal. There is mild thickening of the mitral valve leaflet(s). There is mild calcification of the mitral valve leaflet(s). Mild mitral annular calcification. Trivial mitral valve regurgitation. Tricuspid Valve: Tricuspid valve regurgitation is moderate. Pulmonic Valve: Pulmonic valve regurgitation is mild. IAS/Shunts: The interatrial septum was not well visualized. LEFT VENTRICLE PLAX 2D LVIDd:         5.80 cm   Diastology LVIDs:         4.90 cm   LV e' medial:    7.62 cm/s LV PW:         1.30  cm   LV E/e' medial:  16.0 LV IVS:        1.30 cm   LV e' lateral:   5.98 cm/s LVOT diam:     1.90 cm   LV E/e' lateral: 20.3 LVOT Area:     2.84 cm  RIGHT VENTRICLE RV Basal diam:  5.40 cm RV Mid diam:    5.70 cm RV S prime:     8.27 cm/s LEFT ATRIUM           Index        RIGHT ATRIUM           Index LA diam:      4.80 cm 1.96 cm/m   RA Area:     21.90 cm LA Vol (A4C): 42.2 ml 17.19 ml/m  RA Volume:   70.70 ml  28.80 ml/m                        PULMONIC VALVE AORTA                 PV Vmax:          0.65 m/s Ao Root diam: 3.50 cm PV Peak grad:     1.7 mmHg Ao Asc diam:  3.50 cm PR End Diast Vel: 3.91 msec  MITRAL VALVE                TRICUSPID VALVE MV Area (PHT): 3.96 cm     TR Peak grad:   49.3 mmHg MV Decel Time: 192 msec     TR Vmax:        351.00 cm/s MR Peak grad: 16.2 mmHg MR Vmax:      201.00 cm/s   SHUNTS MV E velocity: 121.67 cm/s  Systemic Diam: 1.90 cm MV A velocity: 57.50 cm/s MV E/A ratio:  2.12 Jenkins Rouge MD  Electronically signed by Jenkins Rouge MD Signature Date/Time: 12/14/2021/4:10:37 PM    Final     Cardiac Studies   2D echo 12/14/21   1. D shaped septum with severe cor pulmonale known . Left ventricular  ejection fraction, by estimation, is 45 to 50%. The left ventricle has  mildly decreased function. The left ventricle demonstrates global  hypokinesis. There is mild left ventricular  hypertrophy. Left ventricular diastolic parameters are indeterminate.   2. Patient has known severe RV failure on TTE done 08/08/20. Right  ventricular systolic function is severely reduced. The right ventricular  size is severely enlarged. There is moderately elevated pulmonary artery  systolic pressure.   3. Left atrial size was moderately dilated.   4. A small pericardial effusion is present. The pericardial effusion is  lateral to the left ventricle and posterior to the left ventricle.   5. The mitral valve is abnormal. Trivial mitral valve regurgitation.   6. Tricuspid valve  regurgitation is moderate.     Patient Profile     76 y.o. 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 Very complex history recently. Readmitted with fall, found to be volume overloaded again.  Assessment & Plan    1. Fall, left knee contusion - required EMS assistance to get up which can be generally poor prognostic indicator - per CT "Small volume water density perisplenic free fluid. Given the presence of pericardial effusion and anasarca this may represent reactive effusion, however in the settings of trauma, and given lack of IV contrast, splenic injury cannot be entirely excluded."  -> per medicine team, they planned to discuss with radiology - incidentally found to have 4.2x4.3cm adnexal mass which will require eventual w/u as well   2. Anasarca, acute on chronic HFmrEF, cor pulmonale, pulm HTN - prior PYP scan equivocal for amyloid - EF variable in the past from 45-60%, here 45-50%, moderate TR - tendency for soft BP at times but improving - has not been on CPAP, was hospitalized at time of last f/u per husband - Lasix increased to '100mg'$  BID yesterday, will review today's plan with MD - palliative consulted as well per IM order - we are concerned about prognosis with progressive decline   3. CKD stage IV, hypokalemia - Cr has been as high as 3 recently, appears stable at 2.16   4. Permanent atrial fibrillation, also note h/o RUE DVT 10/2021, occ PVCs/brief 6 beat NSVT on telemetry (asymptomatic) - Eliquis held but relayed to medicine team as FYI to note that she is also on this for recent DVT as well, not  just AFib - per urology resident note, "If ok from medical perspective, would recommend holding anticoagulation until hematuria resolves. If unable to do so, OK for heparin" - now on IV heparin - otherwise HR 80s-low 100s on Lopressor 12.'5mg'$  BID, tolerating - rate control strategy   5. Hematuria/UTI - per primary team - Eliquis held, on heparin for now - urine still dark in current canister but looks somewhat better than the sample in the canister residual next to it   6. Pericardial effusion - small on limited echo, not felt to be of HD significance at this  time  For questions or updates, please contact [place] Please consult [email or web address] for contact info under Cardiology/STEMI.  Signed, Charlie Pitter, PA-C 12/16/2021, 9:10 AM

## 2021-12-16 NOTE — Plan of Care (Signed)
  Problem: Education: Goal: Knowledge of General Education information will improve Description Including pain rating scale, medication(s)/side effects and non-pharmacologic comfort measures Outcome: Progressing   Problem: Health Behavior/Discharge Planning: Goal: Ability to manage health-related needs will improve Outcome: Progressing   

## 2021-12-16 NOTE — Progress Notes (Signed)
PROGRESS NOTE    Kristine [name]  PJK:[id number] DOB: Jun 25, 1945 DOA: 12/13/2021 PCP: Prince Solian, MD (Confirm with patient/family/NH records and if not entered, this HAS to be entered at [facility] point of entry. "No PCP" if truly none.)   Chief Complaint  Patient presents with   Fall   Laceration    Brief Narrative:    Kristine [name] is a 76 y.o. female with medical history significant of seasonal allergies, asthma, anxiety, depression, chronic atrial fibrillation on apixaban, history of amiodarone toxicity, osteoarthritis, chronic combined systolic and diastolic CHF, community-acquired pneumonia, cataracts, chronic bronchitis, stage IV CKD, dyslipidemia, GERD, hyperlipidemia mild aortic sclerosis, class III obesity, diabetic peripheral neuropathy, type II DM, osteoporosis, hypothyroidism, sleep apnea, scoliosis, spinal stenosis, vitamin D deficiency, history of other nonhemorrhagic CVA with residual left-sided weakness requiring a walker to ambulate who is slipped out of bed falling on her right knee and also injuring her right forearm with a laceration.  She has been having hematuria for the past 3 days. She has also noticed significant lower extremity and abdomen edema.  CT chest/abdomen/pelvis positive for enlargement of previously known pericardial effusion, cardiomegaly, diffuse anasarca, small volume splenic free fluid, right adnexal mass measuring 4.2 x 4.3 cm with recommendations for pelvic ultrasound and aortic atherosclerosis.  She was admitted to [facility] for falls and hematuria. Cardiology consulted for volume overload and pericardial effusion.   Assessment & Plan:   Principal Problem:   Fall Active Problems:   Diabetes mellitus, type 2 ([place])   Diabetic peripheral neuropathy (HCC)   Hypokalemia   CKD (chronic kidney disease) stage 4, GFR 15-29 ml/min (HCC)   Essential hypertension   Pulmonary hypertension (HCC)   Persistent atrial fibrillation (HCC)   Normocytic  anemia   Obesity, Class III, BMI 40-49.9 (morbid obesity) ([place])   Obstructive sleep apnea   Gout   Chronic combined systolic and diastolic CHF (congestive heart failure) (HCC)   Hypoglycemia   Acute lower UTI   Other intra-abdominal and pelvic swelling, mass and lump   Hematuria   Hypothyroidism   Anasarca   Acute on chronic diastolic heart failure with preserved LVef.  Anasarca.  Rv Failure on last echocardiogram.  Severe Pulmonary hypertension/ cardiomyopathy.  Cardiology on board .  Currently being diuresed with IV lasix 100 mg gtt . 1.5 lit diuresed since admission.  Metolazone added.    Recurrent falls:  Deconditioning, will get therapy evaluations.  Dietary consult.    Type 2 DM; with diabetic neuropathy , insulin dependent.  Uncontrolled with hypoglycemia on admission CBG (last 3)  Recent Labs    12/15/21 2047 12/16/21 0723 12/16/21 1207  GLUCAP 182* 247* 253*    Hypoglycemia resolved.  Continue with SSI. Last A1c is 6. In June 2023.  Continue with Lyrica 50 mg BID for diabetic neuropathy.  Patient was on 26 units of Tresiba, which was held for hypoglycemia.  Hemoglobin A1c is 6.3%.  CBG'S  greater than 200 today. Restart Insulin with Semglee at 5 units daily.    Persistent Atrial fibrillation:  Rate between 100 to 110/min.  On metoprolol 12.5 mg BID.  On anti coagulation with eliquis, which is on hold for hematuria.   Hematuria / UTI:  She was started on IV rocephin. Urine cultures show enterococcus, will d/c rocephin and start IV Vancomycin. Transition to oral antibiotics based on sensitivities.   Urology consulted for hematuria.  Urine clearing up today,. Continue to monitor.    Hypothyroidism:  Resume synthroid.   Abnormal  CT abdomen:  Showing small volume splenic free fluid, right adnexal mass measuring 4.2 x 4.3 cm.  ? Concern for splenic injury in view of the fall.  Patient denies any back pain or abdominal pain. She is totally asymptomatic.   Discussed with Radiology, will get an MRI of the abdomen / pelvis to evaluate both the spleen and the right adnexal mass.    Stage 4 CKD:  Creatinine at baseline.    Hypokalemia:  Replaced and  repeat level wnl.    Right adnexal mass;  Right adnexal mass seen on CT is not visualized on the pelvic ultrasound.  MRI will be ordered.    Right upper extremity/ right axillar vein  DVT - On Eliquis , which is on hold.  -  started the patient on IV heparin , transition to eliquis once hematuria is completely resolved.     Body mass index is 45.51 kg/m. Morbid Obesity.  Poor indicator/ prognosis.   Anemia of chronic disease:  Baseline hemoglobin at 10, it appears to be stable.   In view of her multiple co morbidities and advanced cardiomyopathy , palliative care consulted for goals of care.   DVT prophylaxis: Heparin.  Code Status: PARTIAL CODE Family Communication: Husband at bedside.  Disposition:   Status is: Inpatient Remains inpatient appropriate because: IV antibiotics.    Level of care: Telemetry Consultants:  Cardiology Urology.  Palliative care   Procedures:  Echocardiogram.   Antimicrobials:  Antibiotics Given (last 72 hours)     Date/Time Action Medication Dose Rate   12/13/21 1641 Given   cefTRIAXone (ROCEPHIN) injection 1 g 1 g    12/14/21 1354 New Bag/Given   cefTRIAXone (ROCEPHIN) 1 g in sodium chloride 0.9 % 100 mL IVPB 1 g 200 mL/hr   12/15/21 1422 New Bag/Given   cefTRIAXone (ROCEPHIN) 2 g in sodium chloride 0.9 % 100 mL IVPB 2 g 200 mL/hr         Subjective: Reports feeling better, no nausea, vomiting or abdominal pain.   Objective: Vitals:   12/16/21 0634 12/16/21 0920 12/16/21 0940 12/16/21 1354  BP: 121/77 127/72 127/72 120/70  Pulse: 89 96 96 95  Resp: 18  17 (!) 22  Temp: 98.5 F (36.9 C)  97.6 F (36.4 C) 97.7 F (36.5 C)  TempSrc: Oral  Oral Oral  SpO2: 99%  97% 99%  Weight:      Height:   '5\' 9"'$  (1.753 m)      Intake/Output Summary (Last 24 hours) at 12/16/2021 1437 Last data filed at 12/16/2021 1214 Gross per 24 hour  Intake 804.59 ml  Output 1150 ml  Net -345.41 ml    Filed Weights   12/13/21 1238 12/16/21 0424  Weight: 136.1 kg (!) 139.8 kg    Examination:  General exam: Morbidly obese lady, not in distress.  Respiratory system: diminished at bases. On RA.  Cardiovascular system: S1 & S2 heard, RRR. Pedal edema present.  Gastrointestinal system: Abdomen is nondistended, soft and nontender.  Normal bowel sounds heard. Central nervous system: Alert and oriented. No focal neurological deficits. Extremities: pedal edema. Right upper extremity swelling.  Skin: No rashes Psychiatry: Mood & affect appropriate.      Data Reviewed: I have personally reviewed following labs and imaging studies  CBC: Recent Labs  Lab 12/13/21 1433 12/14/21 1323 12/15/21 0357 12/16/21 0423  WBC 11.5* 10.4 10.6* 9.6  NEUTROABS 8.9* 8.1*  --   --   HGB 11.0* 10.1* 10.1* 10.2*  HCT 35.9*  33.5* 33.6* 35.5*  MCV 89.1 91.5 91.6 98.9  PLT 159 141* 159 156     Basic Metabolic Panel: Recent Labs  Lab 12/12/21 1056 12/13/21 1433 12/14/21 1323 12/15/21 0357 12/15/21 0414 12/16/21 0423  NA 141 137 137 139  --  139  K 4.2 2.9* 3.5 3.9  --  4.4  CL 95* 97* 98 101  --  101  CO2 '23 30 30 28  '$ --  26  GLUCOSE 140* 52* 173* 177*  --  172*  BUN 59* 66* 66* 66*  --  65*  CREATININE 2.06* 2.35* 2.34* 2.13*  --  2.16*  CALCIUM 9.5 9.6 9.5 9.4  --  9.5  MG  --  2.4 2.4  --  2.5* 2.6*  PHOS  --   --  4.1  --   --   --      GFR: Estimated Creatinine Clearance: 34.5 mL/min (A) (by C-G formula based on SCr of 2.16 mg/dL (H)).  Liver Function Tests: Recent Labs  Lab 12/13/21 1433 12/14/21 1323 12/15/21 0357  AST '20 16 18  '$ ALT '9 9 9  '$ ALKPHOS 91 86 81  BILITOT 1.6* 1.5* 1.3*  PROT 7.1 6.8 6.5  ALBUMIN 3.8 3.5 3.5     CBG: Recent Labs  Lab 12/15/21 1123 12/15/21 1611 12/15/21 2047  12/16/21 0723 12/16/21 1207  GLUCAP 209* 208* 182* 247* 253*      Recent Results (from the past 240 hour(s))  Urine Culture     Status: Abnormal (Preliminary result)   Collection Time: 12/15/21  2:43 PM   Specimen: Urine, Clean Catch  Result Value Ref Range Status   Specimen Description   Final    URINE, CLEAN CATCH Performed at [facility], [place] [address], [place], [place] [address]    Special Requests   Final    NONE Performed at [facility], [place] [address], [place], [place] [address]    Culture >=100,000 COLONIES/mL ENTEROCOCCUS FAECALIS (A)  Final   Report Status PENDING  Incomplete         Radiology Studies: US PELVIS (TRANSABDOMINAL ONLY)  Result Date: 12/15/2021 CLINICAL DATA:  Adnexal mass EXAM: TRANSABDOMINAL ULTRASOUND OF PELVIS TECHNIQUE: Transabdominal ultrasound examination of the pelvis was performed including evaluation of the uterus, ovaries, adnexal regions, and pelvic cul-de-sac. COMPARISON:  CT chest abdomen and pelvis 12/13/2021. FINDINGS: Uterus Surgically absent. Right ovary Measurements: Not visulaized.  No adnexal mass. Left ovary Measurements: Not visulaized. No adnexal mass. Other findings:  No abnormal free fluid. IMPRESSION: 1. Right adnexal mass seen on CT is not visualized on this exam. Consider MRI for further evaluation. Electronically Signed   By: Ronney Asters M.D.   On: 12/15/2021 15:44   ECHOCARDIOGRAM LIMITED  Result Date: 12/14/2021    ECHOCARDIOGRAM LIMITED REPORT   Patient Name:   DALEIGH POLLINGER Date of Exam: 12/14/2021 Medical Rec #:  [id number]            Height:       69.0 in Accession #:    [id number]           Weight:       300.0 lb Date of Birth:  06-18-1945           BSA:          2.455 m Patient Age:    77 years             BP:  117/81 mmHg Patient Gender: F                    HR:           85 bpm. Exam Location:  Inpatient Procedure: Limited Echo, Color Doppler and Cardiac  Doppler Indications:    pericardial effusion and CHF  History:        Patient has prior history of Echocardiogram examinations, most                 recent 08/08/2020. CHF; Risk Factors:Diabetes and Hypertension.  Sonographer:    Harvie Junior Referring Phys: [id number] DAVID MANUEL ORTIZ  Sonographer Comments: Technically difficult study due to poor echo windows, suboptimal apical window, no subcostal window, suboptimal parasternal window and patient is obese. Image acquisition challenging due to patient body habitus and supine, pain and bleeding under breast. IMPRESSIONS  1. D shaped septum with severe cor pulmonale known . Left ventricular ejection fraction, by estimation, is 45 to 50%. The left ventricle has mildly decreased function. The left ventricle demonstrates global hypokinesis. There is mild left ventricular hypertrophy. Left ventricular diastolic parameters are indeterminate.  2. Patient has known severe RV failure on TTE done 08/08/20. Right ventricular systolic function is severely reduced. The right ventricular size is severely enlarged. There is moderately elevated pulmonary artery systolic pressure.  3. Left atrial size was moderately dilated.  4. A small pericardial effusion is present. The pericardial effusion is lateral to the left ventricle and posterior to the left ventricle.  5. The mitral valve is abnormal. Trivial mitral valve regurgitation.  6. Tricuspid valve regurgitation is moderate. FINDINGS  Left Ventricle: D shaped septum with severe cor pulmonale known. Left ventricular ejection fraction, by estimation, is 45 to 50%. The left ventricle has mildly decreased function. The left ventricle demonstrates global hypokinesis. There is mild left ventricular hypertrophy. Left ventricular diastolic parameters are indeterminate. Right Ventricle: Patient has known severe RV failure on TTE done 08/08/20. The right ventricular size is severely enlarged. Right vetricular wall thickness was not assessed.  Right ventricular systolic function is severely reduced. There is moderately elevated pulmonary artery systolic pressure. The tricuspid regurgitant velocity is 3.51 m/s, and with an assumed right atrial pressure of 8 mmHg, the estimated right ventricular systolic pressure is 45.4 mmHg. Left Atrium: Left atrial size was moderately dilated. Pericardium: A small pericardial effusion is present. The pericardial effusion is lateral to the left ventricle and posterior to the left ventricle. Mitral Valve: The mitral valve is abnormal. There is mild thickening of the mitral valve leaflet(s). There is mild calcification of the mitral valve leaflet(s). Mild mitral annular calcification. Trivial mitral valve regurgitation. Tricuspid Valve: Tricuspid valve regurgitation is moderate. Pulmonic Valve: Pulmonic valve regurgitation is mild. IAS/Shunts: The interatrial septum was not well visualized. LEFT VENTRICLE PLAX 2D LVIDd:         5.80 cm   Diastology LVIDs:         4.90 cm   LV e' medial:    7.62 cm/s LV PW:         1.30 cm   LV E/e' medial:  16.0 LV IVS:        1.30 cm   LV e' lateral:   5.98 cm/s LVOT diam:     1.90 cm   LV E/e' lateral: 20.3 LVOT Area:     2.84 cm  RIGHT VENTRICLE RV Basal diam:  5.40 cm RV Mid diam:    5.70 cm RV S prime:  8.27 cm/s LEFT ATRIUM           Index        RIGHT ATRIUM           Index LA diam:      4.80 cm 1.96 cm/m   RA Area:     21.90 cm LA Vol (A4C): 42.2 ml 17.19 ml/m  RA Volume:   70.70 ml  28.80 ml/m                        PULMONIC VALVE AORTA                 PV Vmax:          0.65 m/s Ao Root diam: 3.50 cm PV Peak grad:     1.7 mmHg Ao Asc diam:  3.50 cm PR End Diast Vel: 3.91 msec  MITRAL VALVE                TRICUSPID VALVE MV Area (PHT): 3.96 cm     TR Peak grad:   49.3 mmHg MV Decel Time: 192 msec     TR Vmax:        351.00 cm/s MR Peak grad: 16.2 mmHg MR Vmax:      201.00 cm/s   SHUNTS MV E velocity: 121.67 cm/s  Systemic Diam: 1.90 cm MV A velocity: 57.50 cm/s MV E/A  ratio:  2.12 Jenkins Rouge MD Electronically signed by Jenkins Rouge MD Signature Date/Time: 12/14/2021/4:10:37 PM    Final         Scheduled Meds:  allopurinol  300 mg Oral q AM   insulin aspart  0-15 Units Subcutaneous TID WC   levothyroxine  88 mcg Oral QAC breakfast   magnesium oxide  400 mg Oral BID   metoprolol tartrate  12.5 mg Oral BID   nystatin   Topical TID   pregabalin  50 mg Oral BID   Continuous Infusions:  sodium chloride 10 mL/hr at 12/14/21 1700   cefTRIAXone (ROCEPHIN)  IV 2 g (12/15/21 1422)   furosemide 100 mg (12/16/21 0920)   heparin 1,400 Units/hr (12/16/21 1244)     LOS: 2 days        Hosie Poisson, MD [facility]   To contact the attending provider between 7A-7P or the covering provider during after hours 7P-7A, please log into the web site [email or web address] and access using universal [place] password for that web site. If you do not have the password, please call the hospital operator.  12/16/2021, 2:37 PM

## 2021-12-16 NOTE — Consult Note (Cosign Needed Addendum)
Consultation Note Date: 12/16/2021   Patient Name: Kristine [name]  DOB: [birth year]  MRN: [id number]  Age / Sex: 76 y.o., female  PCP: Prince Solian, MD Referring Physician: Hosie Poisson, MD  Reason for Consultation:   HPI/Patient Profile: 76 y.o. 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  Cardiology consulted at admission due to anasarca and prior history of difficulty tolerating diuresis. Palliative team consulted in the setting of right sided heart failure with progressive decline.   Primary Decision Maker HCPOA, her 4 children share POA  Discussion: I met with Kristine [name] and her daughter Kristine Racer (Kristine [name]) at bedside. She previously worked as a Engineer, production. She has been married to her husband for >50 years and they have 4 children together, 8 grandchildren, and 1 great-grandchild. She enjoys playing the piano and reading at home. She needs help with ADLs and has a home health aid come for that. She was hospitalized in 8/23 for sepsis with bacteremia and family was concerned that she would not make it through that admission. 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 She continues to love spending time with family and extended family on Sundays.   From a symptoms standpoint, she has shortness of breath with ambulation but minimal to no symptoms at rest. She states that she feels anxious when she gets short of breath and this is relieved with rest. She has difficulty making it to the bathroom and does not like having her children help her  with hygiene as this is embarrassing to her. She has difficulty ambulating and the hospital bed she currently has does not have bed rails.  SUMMARY OF RECOMMENDATIONS    HFmrEF Cor pulmonale Pericardial effusion Cardiology team is onboard. Repeat TTE with EF 45-50% and moderate TR. Diuretic regimen is being titrated up with Lasix 144m IV BID and metolazone 5 mg qd added on 9/26. She is currently has class 3 heart failure symptoms with symptoms with movement but not at rest. We talked about her goals of care and she would like to continue aggressive therapies with goal of returning home. She is interested in outpatient palliative medicine and a referral has been made for her.  OSA She underwent sleep study but has not been fitted for CPAP yet. Her daughter states that they have been trying to get CPAP for some time but had difficulties. I talked with CChrys Racerabout reaching back out to Dr. NElmarie Shileyoffice.  Code Status/Advance Care Planning: Limited code, no CPR or intubation  Prognosis:   Unable to determine  Discharge Planning: To Be Determined  Primary Diagnoses: Present on Admission:  Fall  Chronic combined systolic and diastolic CHF (congestive heart  failure) (HCC)  CKD (chronic kidney disease) stage 4, GFR 15-29 ml/min (HCC)  Diabetic peripheral neuropathy (HCC)  Essential hypertension  Persistent atrial fibrillation (HCC)  Hypoglycemia  Acute lower UTI  Other intra-abdominal and pelvic swelling, mass and lump  Normocytic anemia  Hematuria  Hypokalemia  Obstructive sleep apnea  Gout  Obesity, Class III, BMI 40-49.9 (morbid obesity) ([place])  Hypothyroidism  Pulmonary hypertension ([place])   Review of Systems  Constitutional:  Positive for activity change.  Respiratory:  Positive for shortness of breath.     Physical Exam HENT:     Mouth/Throat:     Mouth: Mucous membranes are dry.  Cardiovascular:     Rate and Rhythm: Rhythm irregular.     Heart sounds: Normal  heart sounds.  Pulmonary:     Effort: Pulmonary effort is normal.     Breath sounds: Normal breath sounds.  Abdominal:     Palpations: Abdomen is soft.  Neurological:     Mental Status: She is alert.     Vital Signs: BP 127/72 (BP Location: Left Arm)   Pulse 96   Temp 97.6 F (36.4 C) (Oral)   Resp 17   Ht 5' 9"  (1.753 m)   Wt (!) 139.8 kg   SpO2 97%   BMI 45.51 kg/m  Pain Scale: 0-10   Pain Score: 0-No pain   SpO2: SpO2: 97 % O2 Device:SpO2: 97 % O2 Flow Rate: .   IO: Intake/output summary:  Intake/Output Summary (Last 24 hours) at 12/16/2021 1237 Last data filed at 12/16/2021 1214 Gross per 24 hour  Intake 804.59 ml  Output 1150 ml  Net -345.41 ml    LBM: Last BM Date :  (Several days since last BM) Baseline Weight: Weight: 136.1 kg Most recent weight: Weight: (!) 139.8 kg       Thank you for this consult. Palliative medicine will continue to follow and assist as needed.  Greater than 50%  of this time was spent counseling and coordinating care related to the above assessment and plan.  Addendum- Met with patient and family. Patient states GOC are to allow her to be able to attend her Sunday family lunches. If she declines again after discharge she would want rehospitalization. They would like outpatient Palliative followup. They had questions regarding obtaining a purewick and a Stedy for home use- will reach out to PT and CM.  Signed by: Daleen Bo. Masters, D.O.  Internal Medicine Resident, PGY-2 [facility]   Mariana Kaufman, AGNP-C Palliative Medicine    Please contact Palliative Medicine Team phone at [phone] for questions and concerns.  For individual provider: See Shea Evans

## 2021-12-16 NOTE — Evaluation (Signed)
Physical Therapy Evaluation Patient Details Name: Cheyla Duchemin MRN: [id number] DOB: 16-Apr-1945 Today's Date: 12/16/2021  History of Present Illness  76 y.o. female with medical history significant of seasonal allergies, asthma, anxiety, depression, chronic atrial fibrillation on apixaban, history of amiodarone toxicity, osteoarthritis, chronic combined systolic and diastolic CHF, community-acquired pneumonia, cataracts, chronic bronchitis, stage IV CKD, dyslipidemia, GERD, hyperlipidemia mild aortic sclerosis, class III obesity, diabetic peripheral neuropathy, type II DM, osteoporosis, hypothyroidism, sleep apnea, scoliosis, spinal stenosis, vitamin D deficiency, history of other nonhemorrhagic CVA with residual left-sided weakness requiring a walker to ambulate who is slipped out of bed falling on her right knee and also injuring her right forearm with a laceration.  She has been having hematuria for the past 3 days. She has also noticed significant lower extremity and abdomen edema. Dx of CHF.  Clinical Impression  Pt admitted with above diagnosis. Mod assist for supine to sit, min A sit to stand, pt took a few pivotal steps from bed to recliner. Ambulation deferred 2* pt fatigue and IV site bleeding significantly, RN called to room. Pt tearful at end of session, daughter present and stated pt is overwhelmed.  Pt currently with functional limitations due to the deficits listed below (see PT Problem List). Pt will benefit from skilled PT to increase their independence and safety with mobility to allow discharge to the venue listed below.          Recommendations for follow up therapy are one component of a multi-disciplinary discharge planning process, led by the attending physician.  Recommendations may be updated based on patient status, additional functional criteria and insurance authorization.  Follow Up Recommendations Home health PT      Assistance Recommended at Discharge  Intermittent Supervision/Assistance  Patient can return home with the following  A little help with walking and/or transfers;A little help with bathing/dressing/bathroom;Assistance with cooking/housework;Assist for transportation;Help with stairs or ramp for entrance    Equipment Recommendations None recommended by PT  Recommendations for Other Services       Functional Status Assessment Patient has had a recent decline in their functional status and demonstrates the ability to make significant improvements in function in a reasonable and predictable amount of time.     Precautions / Restrictions Precautions Precautions: Fall Precaution Comments: daughter reports pt has had other falls in past 6 months mostly due to sliding out of bed while asleep Restrictions Weight Bearing Restrictions: No      Mobility  Bed Mobility Overal bed mobility: Needs Assistance Bed Mobility: Supine to Sit     Supine to sit: Mod assist     General bed mobility comments: assist to raise trunk    Transfers Overall transfer level: Needs assistance Equipment used: Rolling walker (2 wheels) Transfers: Sit to/from Stand, Bed to chair/wheelchair/BSC Sit to Stand: Min assist   Step pivot transfers: Min guard       General transfer comment: assist to rise/steady, no loss of balance nor buckling    Ambulation/Gait               General Gait Details: deferred 2* pt fatigue and IV site actively bleeding (RN called to room)  Stairs            Wheelchair Mobility    Modified Rankin (Stroke Patients Only)       Balance Overall balance assessment: History of Falls, Needs assistance Sitting-balance support: Feet supported, No upper extremity supported Sitting balance-Leahy Scale: Good     Standing balance support:  Bilateral upper extremity supported, During functional activity, Reliant on assistive device for balance Standing balance-Leahy Scale: Fair                                Pertinent Vitals/Pain Pain Assessment Pain Assessment: Faces Faces Pain Scale: Hurts little more Pain Location: R knee Pain Descriptors / Indicators: Sore Pain Intervention(s): Limited activity within patient's tolerance, Monitored during session, Repositioned    Home Living Family/patient expects to be discharged to:: Private residence Living Arrangements: Spouse/significant other Available Help at Discharge: Family;Available 24 hours/day Type of Home: House Home Access: Ramped entrance       Home Layout: One level;Able to live on main level with bedroom/bathroom Home Equipment: Rolling Walker (2 wheels);Rollator (4 wheels);Shower seat - built in;BSC/3in1;Grab bars - toilet;Grab bars - tub/shower;Wheelchair - manual;Hand held shower head;Hospital bed Additional Comments: lives with spouse, children also available at times    Prior Function Prior Level of Function : Needs assist             Mobility Comments: Spouse is present for all mobility. Walks with RW ADLs Comments: Needs assist with all LB ADL's and iADL.  Spouse assists with community mobility.     Hand Dominance        Extremity/Trunk Assessment   Upper Extremity Assessment Upper Extremity Assessment: Defer to OT evaluation    Lower Extremity Assessment Lower Extremity Assessment: Overall WFL for tasks assessed    Cervical / Trunk Assessment Cervical / Trunk Assessment: Normal  Communication   Communication: No difficulties  Cognition Arousal/Alertness: Awake/alert Behavior During Therapy: WFL for tasks assessed/performed, Anxious Overall Cognitive Status: Within Functional Limits for tasks assessed                                 General Comments: pt teary at end of session, seems overwhelmed, IV site profusely bleeding at end of session (RN worked on this just prior to PT and was called back to room as pt bled through dressing)        General Comments       Exercises     Assessment/Plan    PT Assessment Patient needs continued PT services  PT Problem List Decreased mobility;Decreased balance;Decreased activity tolerance       PT Treatment Interventions Gait training;Therapeutic exercise;Therapeutic activities;Functional mobility training;Patient/family education    PT Goals (Current goals can be found in the Care Plan section)  Acute Rehab PT Goals PT Goal Formulation: Patient unable to participate in goal setting Time For Goal Achievement: 12/30/21 Potential to Achieve Goals: Good    Frequency Min 3X/week     Co-evaluation               AM-PAC PT "6 Clicks" Mobility  Outcome Measure Help needed turning from your back to your side while in a flat bed without using bedrails?: A Little Help needed moving from lying on your back to sitting on the side of a flat bed without using bedrails?: A Lot Help needed moving to and from a bed to a chair (including a wheelchair)?: A Little Help needed standing up from a chair using your arms (e.g., wheelchair or bedside chair)?: A Little Help needed to walk in hospital room?: A Lot Help needed climbing 3-5 steps with a railing? : Total 6 Click Score: 14    End of Session Equipment Utilized During  Treatment: Gait belt Activity Tolerance: Patient limited by fatigue Patient left: in chair;with nursing/sitter in room;with call bell/phone within reach;with family/visitor present Nurse Communication: Mobility status PT Visit Diagnosis: Difficulty in walking, not elsewhere classified (R26.2)    Time: 0488-8916 PT Time Calculation (min) (ACUTE ONLY): 20 min   Charges:   PT Evaluation $PT Eval Moderate Complexity: 1 Mod          Philomena Doheny PT 12/16/2021  Acute Rehabilitation Services  Office [phone]

## 2021-12-16 NOTE — Progress Notes (Signed)
ANTICOAGULATION CONSULT NOTE - Follow Up Consult  Pharmacy Consult for Heparin Indication: atrial fibrillation and DVT  Allergies  Allergen Reactions   Epipen [Epinephrine] Other (See Comments)    Tachycardia    Ventolin [Albuterol] Other (See Comments)    Tachycardia A-Fib   Penicillins Other (See Comments)    Flushing Felt hot  Did it involve swelling of the face/tongue/throat, SOB, or low BP? No Did it involve sudden or severe rash/hives, skin peeling, or any reaction on the inside of your mouth or nose? No Did you need to seek medical attention at a hospital or doctor's office? No When did it last happen?    20 years ago   If all above answers are "NO", may proceed with cephalosporin use.   UPDATE 11/05/21: Tolerated Cefazolin 10/29/21 to 11/05/21, switched to oral cefadroxil 11/05/21.    Byetta 10 Mcg Pen [Exenatide] Other (See Comments)    Unknown reaction   Celexa [Citalopram Hydrobromide] Other (See Comments)    Prolonged QT prolongation   Demadex [Torsemide] Swelling   Reclast [Zoledronic Acid] Other (See Comments)    Pt was hospitalized due to medication   Rocephin [Ceftriaxone] Other (See Comments)    Sweats UPDATE 11/05/21: Tolerated Cefazolin 10/29/21 to 11/05/21, switched to oral cefadroxil 11/05/21.   Tape Other (See Comments)    Skin tears easily   Toradol [Ketorolac Tromethamine] Hives and Swelling    Swelling in hands   Zanaflex [Tizanidine] Other (See Comments)    Lethargy    Bee Venom Other (See Comments)    "makes me nervous"   Iodinated Contrast Media Other (See Comments)    Flushing     Keflex [Cephalexin] Other (See Comments)    Makes her feel warm; she was told never to take it.     UPDATE 11/05/21: Tolerated Cefazolin 10/29/21 to 11/05/21, switched to oral cefadroxil 11/05/21.     Patient Measurements: Height: '5\' 9"'$  (175.3 cm) Weight: (!) 139.8 kg (308 lb 3.3 oz) IBW/kg (Calculated) : 66.2 Heparin Dosing Weight: 100kg  Vital Signs: Temp: 97.7 F  (36.5 C) (09/26 1354) Temp Source: Oral (09/26 1354) BP: 120/70 (09/26 1354) Pulse Rate: 95 (09/26 1354)  Labs: Recent Labs    12/14/21 1323 12/15/21 0357 12/16/21 0423 12/16/21 1047  HGB 10.1* 10.1* 10.2*  --   HCT 33.5* 33.6* 35.5*  --   PLT 141* 159 156  --   APTT  --   --   --  34  LABPROT  --  16.7*  --   --   INR  --  1.4*  --   --   HEPARINUNFRC  --   --   --  0.93*  CREATININE 2.34* 2.13* 2.16*  --     Estimated Creatinine Clearance: 34.5 mL/min (A) (by C-G formula based on SCr of 2.16 mg/dL (H)).   Medications:  Infusions:   sodium chloride 10 mL/hr at 12/14/21 1700   cefTRIAXone (ROCEPHIN)  IV 2 g (12/15/21 1422)   furosemide 100 mg (12/16/21 0920)   heparin 1,400 Units/hr (12/16/21 1244)    Assessment: Pt is a 81 yoF with a PMH of atrial fibrillation and recent DVT (10/2021) on apixaban PTA who presented 9/23 with a fall and associated arm bleeding and hematuria. Apixaban was held on admission. She is being initiated on heparin 9/26 now that hematuria has resolved while workup continues.  Brief update:  9/26 PM  aPTT:  90, therapeutic on heparin 1400 units/hr. No major bleeding, RN reports minor  bleeding around old IV site.  No infusion issues reported.  Hematuria is improving.  Goal of Therapy:  Heparin level 0.3-0.7 units/ml aPTT 66-102 seconds Monitor platelets by anticoagulation protocol: Yes   Plan:  Continue heparin IV infusion at 1400 units/hr Confirmatory aPTT in 8 hours Daily aptt, heparin level and CBC   Gretta Arab PharmD, BCPS Clinical Pharmacist [facility] main pharmacy [phone] 12/16/2021 8:45 PM

## 2021-12-16 NOTE — Progress Notes (Signed)
[facility] for heparin Indication: atrial fibrillation and DVT  Allergies  Allergen Reactions   Epipen [Epinephrine] Other (See Comments)    Tachycardia    Ventolin [Albuterol] Other (See Comments)    Tachycardia A-Fib   Penicillins Other (See Comments)    Flushing Felt hot  Did it involve swelling of the face/tongue/throat, SOB, or low BP? No Did it involve sudden or severe rash/hives, skin peeling, or any reaction on the inside of your mouth or nose? No Did you need to seek medical attention at a hospital or doctor's office? No When did it last happen?    20 years ago   If all above answers are "NO", may proceed with cephalosporin use.   UPDATE 11/05/21: Tolerated Cefazolin 10/29/21 to 11/05/21, switched to oral cefadroxil 11/05/21.    Byetta 10 Mcg Pen [Exenatide] Other (See Comments)    Unknown reaction   Celexa [Citalopram Hydrobromide] Other (See Comments)    Prolonged QT prolongation   Demadex [Torsemide] Swelling   Reclast [Zoledronic Acid] Other (See Comments)    Pt was hospitalized due to medication   Rocephin [Ceftriaxone] Other (See Comments)    Sweats UPDATE 11/05/21: Tolerated Cefazolin 10/29/21 to 11/05/21, switched to oral cefadroxil 11/05/21.   Tape Other (See Comments)    Skin tears easily   Toradol [Ketorolac Tromethamine] Hives and Swelling    Swelling in hands   Zanaflex [Tizanidine] Other (See Comments)    Lethargy    Bee Venom Other (See Comments)    "makes me nervous"   Iodinated Contrast Media Other (See Comments)    Flushing     Keflex [Cephalexin] Other (See Comments)    Makes her feel warm; she was told never to take it.     UPDATE 11/05/21: Tolerated Cefazolin 10/29/21 to 11/05/21, switched to oral cefadroxil 11/05/21.     Patient Measurements: Height: '5\' 9"'$  (175.3 cm) Weight: (!) 139.8 kg (308 lb 3.3 oz) IBW/kg (Calculated) : 66.2 Heparin Dosing Weight: 99.7 kg  Vital Signs: Temp: 98.5 F (36.9 C) (09/26  0634) Temp Source: Oral (09/26 0634) BP: 127/72 (09/26 0920) Pulse Rate: 96 (09/26 0920)  Labs: Recent Labs    12/14/21 1323 12/15/21 0357 12/16/21 0423  HGB 10.1* 10.1* 10.2*  HCT 33.5* 33.6* 35.5*  PLT 141* 159 156  LABPROT  --  16.7*  --   INR  --  1.4*  --   CREATININE 2.34* 2.13* 2.16*    Estimated Creatinine Clearance: 34.5 mL/min (A) (by C-G formula based on SCr of 2.16 mg/dL (H)).   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   Anxiety    Arthritis    Asthma    CAP (community acquired pneumonia) 05/31/2015   Cataract    Chronic bronchitis (HCC)    Chronic heart failure with preserved ejection fraction (HFpEF) (HCC)    Chronic kidney disease (CKD), stage IV (severe) (HCC)    Clotting disorder ([place])    Community acquired pneumonia 05/31/2015   Cor pulmonale (chronic) (HCC)    Depression    GERD (gastroesophageal reflux disease)    High cholesterol    History of hiatal hernia    HTN (hypertension)    Mild aortic sclerosis    Morbid obesity (HCC)    Neuromuscular disorder (HCC)    Neuropathic pain    Osteoporosis    Permanent atrial fibrillation ([place])    Scoliosis    Sleep apnea  Spinal stenosis    Thyroid disease    Type II diabetes mellitus ([place])    Vitamin D deficiency     Assessment: Pt is a 33 yoF with a PMH of atrial fibrillation and recent DVT (10/2021) on apixaban PTA who presented 9/23 with a fall and associated arm bleeding and hematuria. Apixaban was held on admission. She is being initiated on heparin 9/26 now that hematuria has resolved while workup continues.   Baseline INR: nontherapeutic Prior anticoagulation: Eliquis 5 mg BID (last dose: 9/23 at 0730) Baseline HL, aPTT pending   Today, 12/16/2021: CBC: Hgb 10.2, low but now stable Plts WNL SCr 2.16, stable at baseline No bleeding or infusion issues per nursing   Goal of Therapy: Heparin level 0.3-0.7 units/ml Monitor  platelets by anticoagulation protocol: Yes   Plan: Administer heparin 5,000 units IV bolus x 1 Start heparin 1,400 units/hr IV infusion after bolus Check heparin level 8 hrs after start Check aPTT levels as needed while DOAC effects persist Daily CBC, daily heparin level once stable Monitor for signs of bleeding or thrombosis  Cannon [place], PharmD Candidate 12/16/2021 9:49 AM

## 2021-12-17 DIAGNOSIS — I5042 Chronic combined systolic (congestive) and diastolic (congestive) heart failure: Secondary | ICD-10-CM | POA: Diagnosis not present

## 2021-12-17 DIAGNOSIS — W19XXXA Unspecified fall, initial encounter: Secondary | ICD-10-CM | POA: Diagnosis not present

## 2021-12-17 DIAGNOSIS — R601 Generalized edema: Secondary | ICD-10-CM | POA: Diagnosis not present

## 2021-12-17 DIAGNOSIS — Z7189 Other specified counseling: Secondary | ICD-10-CM | POA: Diagnosis not present

## 2021-12-17 DIAGNOSIS — N184 Chronic kidney disease, stage 4 (severe): Secondary | ICD-10-CM | POA: Diagnosis not present

## 2021-12-17 LAB — APTT: aPTT: 132 seconds — ABNORMAL HIGH (ref 24–36)

## 2021-12-17 LAB — CBC
HCT: 32 % — ABNORMAL LOW (ref 36.0–46.0)
Hemoglobin: 9.8 g/dL — ABNORMAL LOW (ref 12.0–15.0)
MCH: 27.9 pg (ref 26.0–34.0)
MCHC: 30.6 g/dL (ref 30.0–36.0)
MCV: 91.2 fL (ref 80.0–100.0)
Platelets: 148 10*3/uL — ABNORMAL LOW (ref 150–400)
RBC: 3.51 MIL/uL — ABNORMAL LOW (ref 3.87–5.11)
RDW: 18.2 % — ABNORMAL HIGH (ref 11.5–15.5)
WBC: 10.3 10*3/uL (ref 4.0–10.5)
nRBC: 0 % (ref 0.0–0.2)

## 2021-12-17 LAB — BASIC METABOLIC PANEL
Anion gap: 13 (ref 5–15)
BUN: 68 mg/dL — ABNORMAL HIGH (ref 8–23)
CO2: 28 mmol/L (ref 22–32)
Calcium: 9.6 mg/dL (ref 8.9–10.3)
Chloride: 98 mmol/L (ref 98–111)
Creatinine, Ser: 2.19 mg/dL — ABNORMAL HIGH (ref 0.44–1.00)
GFR, Estimated: 23 mL/min — ABNORMAL LOW (ref >60)
Glucose, Bld: 201 mg/dL — ABNORMAL HIGH (ref 70–99)
Potassium: 3.2 mmol/L — ABNORMAL LOW (ref 3.5–5.1)
Sodium: 139 mmol/L (ref 135–145)

## 2021-12-17 LAB — URINE CULTURE: Culture: >=100000 — AB

## 2021-12-17 LAB — GLUCOSE, CAPILLARY
Glucose-Capillary: 190 mg/dL — ABNORMAL HIGH (ref 70–99)
Glucose-Capillary: 288 mg/dL — ABNORMAL HIGH (ref 70–99)
Glucose-Capillary: 280 mg/dL — ABNORMAL HIGH (ref 70–99)
Glucose-Capillary: 212 mg/dL — ABNORMAL HIGH (ref 70–99)

## 2021-12-17 LAB — HEPARIN LEVEL (UNFRACTIONATED): Heparin Unfractionated: >1.1 IU/mL — ABNORMAL HIGH (ref 0.30–0.70)

## 2021-12-17 MED ORDER — ORAL CARE MOUTH RINSE
15.0000 mL | OROMUCOSAL | Status: DC
  Administered 2021-12-17 – 2021-12-21 (×14): 15 mL via OROMUCOSAL

## 2021-12-17 MED ORDER — POTASSIUM CHLORIDE CRYS ER 20 MEQ PO TBCR
40.0000 meq | EXTENDED_RELEASE_TABLET | Freq: Once | ORAL | Status: AC
  Administered 2021-12-17: 40 meq via ORAL
  Filled 2021-12-17: qty 2

## 2021-12-17 MED ORDER — ORAL CARE MOUTH RINSE: 15.0000 mL | OROMUCOSAL | Status: DC | PRN

## 2021-12-17 MED ORDER — METOLAZONE 5 MG PO TABS
5.0000 mg | ORAL_TABLET | Freq: Once | ORAL | Status: AC
  Administered 2021-12-17: 5 mg via ORAL
  Filled 2021-12-17: qty 1

## 2021-12-17 MED ORDER — FUROSEMIDE 10 MG/ML IJ SOLN
120.0000 mg | Freq: Two times a day (BID) | INTRAVENOUS | Status: DC
  Administered 2021-12-17: 120 mg via INTRAVENOUS
  Filled 2021-12-17: qty 2
  Filled 2021-12-17: qty 12

## 2021-12-17 MED ORDER — BISACODYL 10 MG RE SUPP
10.0000 mg | Freq: Once | RECTAL | Status: AC
  Administered 2021-12-17: 10 mg via RECTAL
  Filled 2021-12-17: qty 1

## 2021-12-17 MED ORDER — POTASSIUM CHLORIDE CRYS ER 20 MEQ PO TBCR: 40.0000 meq | EXTENDED_RELEASE_TABLET | Freq: Every day | ORAL | Status: DC

## 2021-12-17 MED ORDER — APIXABAN 5 MG PO TABS
5.0000 mg | ORAL_TABLET | Freq: Two times a day (BID) | ORAL | Status: DC
  Administered 2021-12-17 – 2021-12-21 (×9): 5 mg via ORAL
  Filled 2021-12-17 (×9): qty 1

## 2021-12-17 NOTE — Progress Notes (Signed)
Cardiology Progress Note  Patient ID: Kristine [name] MRN: [id number] DOB: 05/08/1945 Date of Encounter: 12/17/2021  Primary Cardiologist: Glori Bickers, MD  Subjective   Chief Complaint: SOB  HPI: Remains grossly volume overloaded.  Making some headway with diuresis.  ROS:  All other ROS reviewed and negative. Pertinent positives noted in the HPI.     Inpatient Medications  Scheduled Meds:  allopurinol  300 mg Oral q AM   apixaban  5 mg Oral BID   insulin aspart  0-15 Units Subcutaneous TID WC   insulin glargine-yfgn  5 Units Subcutaneous Daily   levothyroxine  88 mcg Oral QAC breakfast   metolazone  5 mg Oral Once   metoprolol tartrate  12.5 mg Oral BID   nystatin   Topical TID   mouth rinse  15 mL Mouth Rinse 4 times per day   potassium chloride  40 mEq Oral Once   pregabalin  50 mg Oral BID   Continuous Infusions:  sodium chloride 10 mL/hr at 12/14/21 1700   ampicillin (OMNIPEN) IV 2 g (12/17/21 0347)   furosemide     PRN Meds: sodium chloride, acetaminophen **OR** acetaminophen, ALPRAZolam, dextrose, docusate sodium, ondansetron **OR** ondansetron (ZOFRAN) IV, mouth rinse, traMADol   Vital Signs   Vitals:   12/16/21 1354 12/16/21 2159 12/17/21 0500 12/17/21 0618  BP: 120/70 127/78  113/75  Pulse: 95 (!) 103  90  Resp: (!) '22 18  19  '$ Temp: 97.7 F (36.5 C) 97.7 F (36.5 C)  98.7 F (37.1 C)  TempSrc: Oral Oral  Oral  SpO2: 99% 95%  94%  Weight:   (!) 138.9 kg   Height:        Intake/Output Summary (Last 24 hours) at 12/17/2021 1028 Last data filed at 12/17/2021 0647 Gross per 24 hour  Intake 1424.33 ml  Output 1375 ml  Net 49.33 ml      12/17/2021    5:00 AM 12/16/2021    4:24 AM 12/13/2021   12:38 PM  Last 3 Weights  Weight (lbs) 306 lb 3.5 oz 308 lb 3.3 oz 300 lb  Weight (kg) 138.9 kg 139.8 kg 136.079 kg      Telemetry  Overnight telemetry shows Afib 90-100 bpm, which I personally reviewed.   ECG  The most recent ECG shows Afib 92  bpm, which I personally reviewed.   Physical Exam   Vitals:   12/16/21 1354 12/16/21 2159 12/17/21 0500 12/17/21 0618  BP: 120/70 127/78  113/75  Pulse: 95 (!) 103  90  Resp: (!) '22 18  19  '$ Temp: 97.7 F (36.5 C) 97.7 F (36.5 C)  98.7 F (37.1 C)  TempSrc: Oral Oral  Oral  SpO2: 99% 95%  94%  Weight:   (!) 138.9 kg   Height:        Intake/Output Summary (Last 24 hours) at 12/17/2021 1028 Last data filed at 12/17/2021 0647 Gross per 24 hour  Intake 1424.33 ml  Output 1375 ml  Net 49.33 ml       12/17/2021    5:00 AM 12/16/2021    4:24 AM 12/13/2021   12:38 PM  Last 3 Weights  Weight (lbs) 306 lb 3.5 oz 308 lb 3.3 oz 300 lb  Weight (kg) 138.9 kg 139.8 kg 136.079 kg    Body mass index is 45.22 kg/m.  General: Well nourished, well developed, in no acute distress Head: Atraumatic, normal size  Eyes: PEERLA, EOMI  Neck: Supple, no JVD Endocrine: No thryomegaly Cardiac:  Normal S1, S2; irregular rhythm, no m/r/g Lungs: Clear to auscultation bilaterally, no wheezing, rhonchi or rales  Abd: Soft, nontender, no hepatomegaly  Ext: Trace lower extremity edema, 2+ thigh edema, edema in the buttocks Musculoskeletal: No deformities, BUE and BLE strength normal and equal Skin: Warm and dry, no rashes   Neuro: Alert and oriented to person, place, time, and situation, CNII-XII grossly intact, no focal deficits  Psych: Normal mood and affect   Labs  High Sensitivity Troponin:  No results for input(s): "TROPONINIHS" in the last 720 hours.   Cardiac EnzymesNo results for input(s): "TROPONINI" in the last 168 hours. No results for input(s): "TROPIPOC" in the last 168 hours.  Chemistry Recent Labs  Lab 12/13/21 1433 12/14/21 1323 12/15/21 0357 12/16/21 0423 12/17/21 0418  NA 137 137 139 139 139  K 2.9* 3.5 3.9 4.4 3.2*  CL 97* 98 101 101 98  CO2 '30 30 28 26 28  '$ GLUCOSE 52* 173* 177* 172* 201*  BUN 66* 66* 66* 65* 68*  CREATININE 2.35* 2.34* 2.13* 2.16* 2.19*  CALCIUM 9.6 9.5  9.4 9.5 9.6  PROT 7.1 6.8 6.5  --   --   ALBUMIN 3.8 3.5 3.5  --   --   AST '20 16 18  '$ --   --   ALT '9 9 9  '$ --   --   ALKPHOS 91 86 81  --   --   BILITOT 1.6* 1.5* 1.3*  --   --   GFRNONAA 21* 21* 24* 23* 23*  ANIONGAP '10 9 10 12 13    '$ Hematology Recent Labs  Lab 12/15/21 0357 12/16/21 0423 12/17/21 0418  WBC 10.6* 9.6 10.3  RBC 3.67* 3.59* 3.51*  HGB 10.1* 10.2* 9.8*  HCT 33.6* 35.5* 32.0*  MCV 91.6 98.9 91.2  MCH 27.5 28.4 27.9  MCHC 30.1 28.7* 30.6  RDW 18.5* 19.1* 18.2*  PLT 159 156 148*   BNPNo results for input(s): "BNP", "PROBNP" in the last 168 hours.  DDimer No results for input(s): "DDIMER" in the last 168 hours.   Radiology  MR PELVIS WO CONTRAST  Result Date: 12/17/2021 CLINICAL DATA:  Right adnexal mass incidentally identified by CT EXAM: MRI PELVIS WITHOUT CONTRAST TECHNIQUE: Multiplanar multisequence MR imaging of the pelvis was performed. No intravenous contrast was administered. COMPARISON:  CT chest abdomen pelvis, 12/13/2021 FINDINGS: Urinary Tract:  No abnormality visualized. Bowel: Sigmoid diverticulosis. Otherwise unremarkable visualized pelvic bowel loops. Vascular/Lymphatic: No pathologically enlarged lymph nodes. No significant vascular abnormality seen. Reproductive: Status post hysterectomy. Fluid signal, cystic appearing lesion of the right ovary measuring 3.9 x 3.6 cm (series 5, image 13). Fluid signal cystic lesion of the left ovary measuring 1.8 x 1.0 cm (series 5, image 8). No obvious solid component on noncontrast examination. Other:  Severe anasarca. Musculoskeletal: No suspicious bone lesions identified. IMPRESSION: 1. Fluid signal, cystic appearing lesion of the right ovary measuring 3.9 x 3.6 cm. No obvious solid component on noncontrast examination. 2. Fluid signal cystic appearing lesion of the left ovary measuring 1.8 x 1.0 cm. No obvious solid component on noncontrast examination. 3. Although most likely benign and incidental, these lesions are  incompletely characterized by noncontrast MR, and given nonvisualization at prior ultrasound, contrast enhanced MR surveillance should be considered in 6-12 months to ensure stability. 4. Severe anasarca. 5. Sigmoid diverticulosis. Electronically Signed   By: Delanna Ahmadi M.D.   On: 12/17/2021 08:10   US PELVIS (TRANSABDOMINAL ONLY)  Result Date: 12/15/2021 CLINICAL DATA:  Adnexal  mass EXAM: TRANSABDOMINAL ULTRASOUND OF PELVIS TECHNIQUE: Transabdominal ultrasound examination of the pelvis was performed including evaluation of the uterus, ovaries, adnexal regions, and pelvic cul-de-sac. COMPARISON:  CT chest abdomen and pelvis 12/13/2021. FINDINGS: Uterus Surgically absent. Right ovary Measurements: Not visulaized.  No adnexal mass. Left ovary Measurements: Not visulaized. No adnexal mass. Other findings:  No abnormal free fluid. IMPRESSION: 1. Right adnexal mass seen on CT is not visualized on this exam. Consider MRI for further evaluation. Electronically Signed   By: Ronney Asters M.D.   On: 12/15/2021 15:44    Cardiac Studies  TTE 12/14/2021  1. D shaped septum with severe cor pulmonale known . Left ventricular  ejection fraction, by estimation, is 45 to 50%. The left ventricle has  mildly decreased function. The left ventricle demonstrates global  hypokinesis. There is mild left ventricular  hypertrophy. Left ventricular diastolic parameters are indeterminate.   2. Patient has known severe RV failure on TTE done 08/08/20. Right  ventricular systolic function is severely reduced. The right ventricular  size is severely enlarged. There is moderately elevated pulmonary artery  systolic pressure.   3. Left atrial size was moderately dilated.   4. A small pericardial effusion is present. The pericardial effusion is  lateral to the left ventricle and posterior to the left ventricle.   5. The mitral valve is abnormal. Trivial mitral valve regurgitation.   6. Tricuspid valve regurgitation is moderate.    Patient Profile  76 year old female with history of HFpEF, cor pulmonale with severe pulm hypertension, permanent atrial fibrillation, CKD stage IV, DVT, polyneuropathy, carpal tunnel, morbid obesity, OSA who was admitted on 12/13/2021 for fall.  Cardiology was consulted for acute volume overload.  Assessment & Plan   #Acute on chronic systolic heart failure #Cor pulmonale #CKD stage IV #Anasarca -Still with significant anasarca.  Would recommend to continue with diuresis.  Increase Lasix to 120 mg IV twice daily.  Redose metolazone 5 mg today.  All of her imaging studies shows severe anasarca.  She is basically bedridden.  Hopefully we can get some of this fluid off.  Kidney function also limits [place].  We will do the best we can.  #Persistent atrial fibrillation #Hematuria/UTI -Admitted with fall.  Remains on heparin drip.  Would like to get her back on Eliquis as we are able.  This will really help with fluid.     For questions or updates, please contact [place] Please consult [email or web address] for contact info under     Signed, [place] T. Audie Box, MD, [place]  12/17/2021 10:28 AM

## 2021-12-17 NOTE — Plan of Care (Signed)

## 2021-12-17 NOTE — Progress Notes (Signed)
ANTICOAGULATION CONSULT NOTE - Follow Up Consult  Pharmacy Consult for Heparin Indication: atrial fibrillation and DVT  Allergies  Allergen Reactions   Epipen [Epinephrine] Other (See Comments)    Tachycardia    Ventolin [Albuterol] Other (See Comments)    Tachycardia A-Fib   Penicillins Other (See Comments)    Flushing Felt hot  Did it involve swelling of the face/tongue/throat, SOB, or low BP? No Did it involve sudden or severe rash/hives, skin peeling, or any reaction on the inside of your mouth or nose? No Did you need to seek medical attention at a hospital or doctor's office? No When did it last happen?    20 years ago   If all above answers are "NO", may proceed with cephalosporin use.   UPDATE 11/05/21: Tolerated Cefazolin 10/29/21 to 11/05/21, switched to oral cefadroxil 11/05/21.    Byetta 10 Mcg Pen [Exenatide] Other (See Comments)    Unknown reaction   Celexa [Citalopram Hydrobromide] Other (See Comments)    Prolonged QT prolongation   Demadex [Torsemide] Swelling   Reclast [Zoledronic Acid] Other (See Comments)    Pt was hospitalized due to medication   Rocephin [Ceftriaxone] Other (See Comments)    Sweats UPDATE 11/05/21: Tolerated Cefazolin 10/29/21 to 11/05/21, switched to oral cefadroxil 11/05/21.   Tape Other (See Comments)    Skin tears easily   Toradol [Ketorolac Tromethamine] Hives and Swelling    Swelling in hands   Zanaflex [Tizanidine] Other (See Comments)    Lethargy    Bee Venom Other (See Comments)    "makes me nervous"   Iodinated Contrast Media Other (See Comments)    Flushing     Keflex [Cephalexin] Other (See Comments)    Makes her feel warm; she was told never to take it.     UPDATE 11/05/21: Tolerated Cefazolin 10/29/21 to 11/05/21, switched to oral cefadroxil 11/05/21.     Patient Measurements: Height: '5\' 9"'$  (175.3 cm) Weight: (!) 139.8 kg (308 lb 3.3 oz) IBW/kg (Calculated) : 66.2 Heparin Dosing Weight: 100kg  Vital Signs: Temp: 98.7 F  (37.1 C) (09/27 0618) Temp Source: Oral (09/27 0618) BP: 113/75 (09/27 0618) Pulse Rate: 90 (09/27 0618)  Labs: Recent Labs    12/15/21 0357 12/16/21 0423 12/16/21 1047 12/16/21 2003 12/17/21 0418  HGB 10.1* 10.2*  --   --  9.8*  HCT 33.6* 35.5*  --   --  32.0*  PLT 159 156  --   --  148*  APTT  --   --  34 90* 132*  LABPROT 16.7*  --   --   --   --   INR 1.4*  --   --   --   --   HEPARINUNFRC  --   --  0.93*  --  >1.10*  CREATININE 2.13* 2.16*  --   --  2.19*     Estimated Creatinine Clearance: 34 mL/min (A) (by C-G formula based on SCr of 2.19 mg/dL (H)).   Medications:  Infusions:   sodium chloride 10 mL/hr at 12/14/21 1700   ampicillin (OMNIPEN) IV 2 g (12/17/21 0347)   furosemide 100 mg (12/16/21 1822)   heparin 1,400 Units/hr (12/17/21 0026)    Assessment: Pt is a 42 yoF with a PMH of atrial fibrillation and recent DVT (10/2021) on apixaban PTA who presented 9/23 with a fall and associated arm bleeding and hematuria. Apixaban was held on admission. She is being initiated on heparin 9/26 now that hematuria has resolved while workup continues.  12/17/2021: aPTT:  132, now supra-therapeutic on heparin 1400 units/hr. Heparin level >1.1- increased, likely due to recent Eliquis in combination with elevated heparin concentration in blood CBC: Hg 9.8- low/stable, pltc 148-low/stable No major bleeding, RN reports minor bleeding around old IV site.  Hematuria resolved. No infusion issues reported- was paused briefly overnight when patient went to MRI. Lab was drawn from opposite arm as heparin infusion  Goal of Therapy:  Heparin level 0.3-0.7 units/ml aPTT 66-102 seconds Monitor platelets by anticoagulation protocol: Yes   Plan:  Decrease heparin IV infusion to 1250 units/hr Recheck aPTT in 8 hours Daily aptt, heparin level and CBC   Netta Cedars, PharmD, BCPS 12/17/2021 6:30 AM

## 2021-12-17 NOTE — Plan of Care (Signed)
  Problem: Education: Goal: Knowledge of General Education information will improve Description Including pain rating scale, medication(s)/side effects and non-pharmacologic comfort measures Outcome: Progressing   Problem: Health Behavior/Discharge Planning: Goal: Ability to manage health-related needs will improve Outcome: Progressing   

## 2021-12-17 NOTE — Progress Notes (Signed)
PROGRESS NOTE    Kristine [name]  XNA:[id number] DOB: Oct 13, 1945 DOA: 12/13/2021 PCP: Prince Solian, MD    Brief Narrative:  Kristine [name] is a 76 y.o. female with medical history significant of seasonal allergies, asthma, anxiety, depression, chronic atrial fibrillation on apixaban, history of amiodarone toxicity, osteoarthritis, chronic combined systolic and diastolic CHF, community-acquired pneumonia, cataracts, chronic bronchitis, stage IV CKD, dyslipidemia, GERD, hyperlipidemia, mild aortic sclerosis, class III obesity, diabetic peripheral neuropathy, type II DM, osteoporosis, hypothyroidism, sleep apnea, scoliosis, spinal stenosis, vitamin D deficiency, history of other nonhemorrhagic CVA with residual left-sided weakness requiring a walker to ambulate who slipped out of bed falling on her right knee and also injuring her right forearm with a laceration.  She has been having hematuria for the past 3 days. She has also noticed significant lower extremity and abdomen edema.   CT chest/abdomen/pelvis positive for enlargement of previously known pericardial effusion, cardiomegaly, diffuse anasarca, small volume splenic free fluid, right adnexal mass measuring 4.2 x 4.3 cm with recommendations for pelvic ultrasound and aortic atherosclerosis.   She was admitted to [facility] for falls and hematuria. Cardiology consulted for volume overload and pericardial effusion.    Assessment & Plan:   Acute on chronic diastolic congestive heart failure with preserved ejection fraction/anasarca: Advanced right ventricular heart failure with severe pulmonary hypertension and cardiomyopathy. Challenging diuresing.  Cardiology following. Currently remains on Lasix 120 mg IV twice daily today, added metolazone 5 mg today.  Urine output 1375 over last 24 hours.  Continue intake output monitoring.  Daily weight.  Mobility.  Renal functions monitoring. Followed by palliative care.  Type 2 diabetes with  diabetic neuropathy, hyperglycemia on admission: Hypoglycemia resolved.  A1c 6.3.  Currently remains on Lyrica 50 mg twice daily for diabetic neuropathy. Patient is on Tresiba 26 units at home, held for hypoglycemia. Remains on low-dose insulin.  Persistent A-fib: Heart rate controlled.  On metoprolol.  Tolerating heparin.  Start Eliquis today.  Hematuria due to UTI and also due to coagulopathy: Earlier on Rocephin.  Urine culture showing Enterococcus.  Final sensitivity pending.  Remains on ampicillin.  Seen by urology.  Urine clearing up.  Challenge with Eliquis today.  Abnormal CT scan: Patient has ovarian cyst and adnexal cyst.  Will need follow-up.  CKD stage IV: Creatinine at about baseline.  Right upper extremity/right axillary vein DVT: On Eliquis.  Hypokalemia: Replaced.  We will keep on a scheduled replacement when patient is on Lasix.  Debility and deconditioning: Work with PT OT.    DVT prophylaxis: SCDs Start: 12/14/21 1647 apixaban (ELIQUIS) tablet 5 mg   Code Status: Partial Family Communication: Husband at the bedside Disposition Plan: Status is: Inpatient Remains inpatient appropriate because: IV diuresis, significant symptoms     Consultants:  Cardiology Palliative care  Procedures:  None  Antimicrobials:  Ampicillin   Subjective: Patient seen and examined.  Husband at the bedside.  Patient tells me that she is short of breath to start with but when she starts talking she is fine.  Poor historian.  She is asking me what is all wrong with her.  We discussed about heart, kidney, fluid overload and difficulty to balance with fluid and medications.  Patient is agreeable.  Objective: Vitals:   12/16/21 2159 12/17/21 0500 12/17/21 0618 12/17/21 1327  BP: 127/78  113/75 117/80  Pulse: (!) 103  90 75  Resp: 18  19   Temp: 97.7 F (36.5 C)  98.7 F (37.1 C) (!) 97.5 F (36.4 C)  TempSrc: Oral  Oral Oral  SpO2: 95%  94% 97%  Weight:  (!) 138.9 kg     Height:        Intake/Output Summary (Last 24 hours) at 12/17/2021 1529 Last data filed at 12/17/2021 1524 Gross per 24 hour  Intake 1586.08 ml  Output 1625 ml  Net -38.92 ml   Filed Weights   12/13/21 1238 12/16/21 0424 12/17/21 0500  Weight: 136.1 kg (!) 139.8 kg (!) 138.9 kg    Examination:  General exam: Appears calm and comfortable  Frail and debilitated.  Morbidly obese.  On room air. Respiratory system: Poor lateral air entry.  No added sounds. Cardiovascular system: S1 & S2 heard, RRR.  2+ bilateral pedal edema. Gastrointestinal system: Abdomen is nondistended, soft and nontender. No organomegaly or masses felt. Normal bowel sounds heard.  Obese and pendulous. Central nervous system: Alert and oriented. No focal neurological deficits.  Generalized weakness.    Data Reviewed: I have personally reviewed following labs and imaging studies  CBC: Recent Labs  Lab 12/13/21 1433 12/14/21 1323 12/15/21 0357 12/16/21 0423 12/17/21 0418  WBC 11.5* 10.4 10.6* 9.6 10.3  NEUTROABS 8.9* 8.1*  --   --   --   HGB 11.0* 10.1* 10.1* 10.2* 9.8*  HCT 35.9* 33.5* 33.6* 35.5* 32.0*  MCV 89.1 91.5 91.6 98.9 91.2  PLT 159 141* 159 156 287*   Basic Metabolic Panel: Recent Labs  Lab 12/13/21 1433 12/14/21 1323 12/15/21 0357 12/15/21 0414 12/16/21 0423 12/17/21 0418  NA 137 137 139  --  139 139  K 2.9* 3.5 3.9  --  4.4 3.2*  CL 97* 98 101  --  101 98  CO2 '30 30 28  '$ --  26 28  GLUCOSE 52* 173* 177*  --  172* 201*  BUN 66* 66* 66*  --  65* 68*  CREATININE 2.35* 2.34* 2.13*  --  2.16* 2.19*  CALCIUM 9.6 9.5 9.4  --  9.5 9.6  MG 2.4 2.4  --  2.5* 2.6*  --   PHOS  --  4.1  --   --   --   --    GFR: Estimated Creatinine Clearance: 33.9 mL/min (A) (by C-G formula based on SCr of 2.19 mg/dL (H)). Liver Function Tests: Recent Labs  Lab 12/13/21 1433 12/14/21 1323 12/15/21 0357  AST '20 16 18  '$ ALT '9 9 9  '$ ALKPHOS 91 86 81  BILITOT 1.6* 1.5* 1.3*  PROT 7.1 6.8 6.5  ALBUMIN  3.8 3.5 3.5   No results for input(s): "LIPASE", "AMYLASE" in the last 168 hours. No results for input(s): "AMMONIA" in the last 168 hours. Coagulation Profile: Recent Labs  Lab 12/15/21 0357  INR 1.4*   Cardiac Enzymes: No results for input(s): "CKTOTAL", "CKMB", "CKMBINDEX", "TROPONINI" in the last 168 hours. BNP (last 3 results) No results for input(s): "PROBNP" in the last 8760 hours. HbA1C: Recent Labs    12/15/21 0357  HGBA1C 6.3*   CBG: Recent Labs  Lab 12/16/21 1207 12/16/21 1626 12/16/21 2201 12/17/21 0732 12/17/21 1121  GLUCAP 253* 191* 225* 190* 288*   Lipid Profile: No results for input(s): "CHOL", "HDL", "LDLCALC", "TRIG", "CHOLHDL", "LDLDIRECT" in the last 72 hours. Thyroid Function Tests: No results for input(s): "TSH", "T4TOTAL", "FREET4", "T3FREE", "THYROIDAB" in the last 72 hours. Anemia Panel: No results for input(s): "VITAMINB12", "FOLATE", "FERRITIN", "TIBC", "IRON", "RETICCTPCT" in the last 72 hours. Sepsis Labs: No results for input(s): "PROCALCITON", "LATICACIDVEN" in the last 168 hours.  Recent Results (  from the past 240 hour(s))  Urine Culture     Status: Abnormal   Collection Time: 12/15/21  2:43 PM   Specimen: Urine, Clean Catch  Result Value Ref Range Status   Specimen Description   Final    URINE, CLEAN CATCH Performed at [facility], [place] [address], [place], [place] [address]    Special Requests   Final    NONE Performed at [facility], [place] [address] [place], [place] [address]    Culture >=100,000 COLONIES/mL ENTEROCOCCUS FAECALIS (A)  Final   Report Status 12/17/2021 FINAL  Final   Organism ID, Bacteria ENTEROCOCCUS FAECALIS (A)  Final      Susceptibility   Enterococcus faecalis - MIC*    AMPICILLIN <=2 SENSITIVE Sensitive     NITROFURANTOIN <=16 SENSITIVE Sensitive     VANCOMYCIN 2 SENSITIVE Sensitive     * >=100,000 COLONIES/mL ENTEROCOCCUS FAECALIS         Radiology  Studies: MR PELVIS WO CONTRAST  Result Date: 12/17/2021 CLINICAL DATA:  Right adnexal mass incidentally identified by CT EXAM: MRI PELVIS WITHOUT CONTRAST TECHNIQUE: Multiplanar multisequence MR imaging of the pelvis was performed. No intravenous contrast was administered. COMPARISON:  CT chest abdomen pelvis, 12/13/2021 FINDINGS: Urinary Tract:  No abnormality visualized. Bowel: Sigmoid diverticulosis. Otherwise unremarkable visualized pelvic bowel loops. Vascular/Lymphatic: No pathologically enlarged lymph nodes. No significant vascular abnormality seen. Reproductive: Status post hysterectomy. Fluid signal, cystic appearing lesion of the right ovary measuring 3.9 x 3.6 cm (series 5, image 13). Fluid signal cystic lesion of the left ovary measuring 1.8 x 1.0 cm (series 5, image 8). No obvious solid component on noncontrast examination. Other:  Severe anasarca. Musculoskeletal: No suspicious bone lesions identified. IMPRESSION: 1. Fluid signal, cystic appearing lesion of the right ovary measuring 3.9 x 3.6 cm. No obvious solid component on noncontrast examination. 2. Fluid signal cystic appearing lesion of the left ovary measuring 1.8 x 1.0 cm. No obvious solid component on noncontrast examination. 3. Although most likely benign and incidental, these lesions are incompletely characterized by noncontrast MR, and given nonvisualization at prior ultrasound, contrast enhanced MR surveillance should be considered in 6-12 months to ensure stability. 4. Severe anasarca. 5. Sigmoid diverticulosis. Electronically Signed   By: Delanna Ahmadi M.D.   On: 12/17/2021 08:10        Scheduled Meds:  allopurinol  300 mg Oral q AM   apixaban  5 mg Oral BID   insulin aspart  0-15 Units Subcutaneous TID WC   insulin glargine-yfgn  5 Units Subcutaneous Daily   levothyroxine  88 mcg Oral QAC breakfast   metoprolol tartrate  12.5 mg Oral BID   nystatin   Topical TID   mouth rinse  15 mL Mouth Rinse 4 times per day   [START  ON 12/18/2021] potassium chloride  40 mEq Oral Daily   pregabalin  50 mg Oral BID   Continuous Infusions:  sodium chloride 10 mL/hr at 12/14/21 1700   ampicillin (OMNIPEN) IV 2 g (12/17/21 1148)   furosemide       LOS: 3 days    Time spent: 35 minutes    Barb Merino, MD [facility] Pager [phone]

## 2021-12-17 NOTE — Progress Notes (Addendum)
Daily Progress Note   Patient Name: Kristine [name]       Date: 12/17/2021 DOB: Jul 23, 1945  Age: 76 y.o. MRN#: [id number] Attending Physician: Barb Merino, MD Primary Care Physician: Prince Solian, MD Admit Date: 12/13/2021  Reason for Consultation/Follow-up: Establishing goals of care  Patient Profile/HPI:  76 y.o. 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   Cardiology consulted at admission due to anasarca and prior history of difficulty tolerating diuresis. Palliative team consulted in the setting of right sided heart failure with progressive decline.   Subjective: Kristine [name] is sitting up in bed talking with friends and husband at bedside. She reports that she got up and moved a small amount this morning and found this to be very tiring. Otherwise she is in good spirits today. Her goal remains to go home with palliative services.  Physical Exam Constitutional:      Appearance: She is obese.  HENT:     Mouth/Throat:     Mouth: Mucous membranes are dry.  Cardiovascular:     Rate and Rhythm: Rhythm irregular.     Heart sounds: No murmur heard. Pulmonary:     Effort: Pulmonary effort is normal.     Breath sounds: Rales present.     Comments: In lungs bases bilaterally Abdominal:     Palpations: Abdomen is soft.  Musculoskeletal:     Comments: 1+ pitting edema to upper thigh and hips bilaterally  Skin:    General: Skin is warm and dry.  Neurological:     Mental Status: She is alert and oriented to person,  place, and time.             Vital Signs: BP 113/75 (BP Location: Left Arm)   Pulse 90   Temp 98.7 F (37.1 C) (Oral)   Resp 19   Ht '5\' 9"'$  (1.753 m)   Wt (!) 138.9 kg   SpO2 94%   BMI 45.22 kg/m  SpO2: SpO2: 94 % O2 Device: O2 Device: Room Air O2 Flow Rate:    Intake/output summary:  Intake/Output Summary (Last 24 hours)  at 12/17/2021 1203 Last data filed at 12/17/2021 1610 Gross per 24 hour  Intake 1424.33 ml  Output 1275 ml  Net 149.33 ml   LBM: Last BM Date :  (PTA) Baseline Weight: Weight: 136.1 kg Most recent weight: Weight: (!) 138.9 kg       Palliative Assessment/Data: PPS: 40%      Patient Active Problem List   Diagnosis Date Noted   Hypoglycemia 12/14/2021   Acute lower UTI 12/14/2021   Other intra-abdominal and pelvic swelling, mass and lump 12/14/2021   Hematuria 12/14/2021   Hypothyroidism 12/14/2021   Anasarca 12/14/2021   Fall 12/13/2021   Atrial fibrillation with rapid ventricular response ([place])    Bacteremia    Cellulitis    Pressure injury of skin 10/29/2021   Septic shock ([place]) 10/28/2021   Acute pulmonary edema ([place])    Acute right-sided heart failure (HCC)    CHF (congestive heart failure) ([place]) 09/11/2021   Hypokalemia 09/11/2021   Hematoma of left knee region 05/14/2021   Vaginal atrophy 03/20/2021   DM type 2 with diabetic peripheral neuropathy ([place]) 01/07/2021   Compression fracture of L2 ([place]) 01/11/2020   Low back pain 12/06/2019   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 ([place]) 08/31/2019   Moderate nonproliferative diabetic retinopathy of right eye ([place]) 08/31/2019   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   Chronic combined systolic and diastolic CHF (congestive heart failure) ([place]) 06/29/2019   Gout 01/03/2019   Pulmonary hypertension ([place]) 01/03/2019   Essential hypertension 01/03/2019   Candidiasis, intertriginous 01/03/2019   Unspecified asthma, uncomplicated 96/06/5407   CKD (chronic kidney disease) stage  4, GFR 15-29 ml/min (HCC) 06/03/2018   Age-related osteoporosis without current pathological fracture 05/22/2018   Gastroesophageal reflux disease without esophagitis 05/22/2018   Cardiomyopathy ([place]) 02/11/2018   Charcot foot due to diabetes mellitus ([place]) 02/11/2018   Uncontrolled type 2 diabetes mellitus with hyperglycemia ([place]) 02/11/2018   Gait abnormality 02/09/2017   Obstructive sleep apnea 02/09/2017   Diabetic peripheral neuropathy ([place]) 09/02/2016   Peripheral neuropathy 07/29/2016   Spinal stenosis of lumbar region 07/29/2016   Excessive daytime sleepiness 07/29/2016   Obesity, Class III, BMI 40-49.9 (morbid obesity) ([place]) 07/29/2016   Normocytic anemia 05/31/2015   Acute on chronic combined systolic and diastolic CHF (congestive heart failure) ([place]) 05/31/2015   Paroxysmal atrial fibrillation ([place])    Obesity 81/19/1478   Diastolic dysfunction with chronic heart failure ([place]) 06/23/2013   Persistent atrial fibrillation ([place]) 06/20/2010   Pneumonia    Abnormal cardiovascular function study    High cholesterol    Scoliosis    Spinal stenosis    Anxiety    Osteoarthritis    Osteoporosis    Mild aortic sclerosis    Degenerative disc disease    Vitamin D deficiency    HEMOPTYSIS UNSPECIFIED 05/12/2010   Diabetes mellitus, type 2 ([place]) 11/19/2009   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 MRI completed this morning showed anasarca. Urine output was not at goal with addition of metolazone yesterday. Cardiology increasing lasix to 120 mg BID and re-dosing metolazone. Her goal remains going home with palliative services. We revisited the distinction between palliative medicine and hospice care and she wishes to continue  aggressive interventions at this time.   OSA She underwent sleep study but  has not been fitted for CPAP yet. Her daughter states that they have been trying to get CPAP for some time but had difficulties. Talked with husband today about reaching back out to cardiology office.   Code Status: DNR  Code status was readdressed 9/26 and patient confirmed no intubation and no CPR. Her daughter Chrys Racer was present at that time and agreed. Order was updated to DNR.  Prognosis:  Unable to determine  Discharge Planning: Home with [place] was discussed with Husband 9/27 and husband and daughter 9/26.  Thank you for allowing the Palliative Medicine Team to assist in the care of this patient. Greater than 50%  of this time was spent counseling and coordinating care related to the above assessment and plan.  Katie M. Masters, D.O.  Internal Medicine Resident, PGY-2 [facility]  12:03 PM, 12/17/2021   Mariana Kaufman, AGNP-C Palliative Medicine   **Please contact the on call pager after 5 pm and on weekends at [phone].**   Please contact Palliative Medicine Team phone at [phone] for questions and concerns.

## 2021-12-18 ENCOUNTER — Other Ambulatory Visit: Payer: Medicare HMO

## 2021-12-18 DIAGNOSIS — W19XXXA Unspecified fall, initial encounter: Secondary | ICD-10-CM | POA: Diagnosis not present

## 2021-12-18 DIAGNOSIS — I5042 Chronic combined systolic (congestive) and diastolic (congestive) heart failure: Secondary | ICD-10-CM | POA: Diagnosis not present

## 2021-12-18 DIAGNOSIS — I4819 Other persistent atrial fibrillation: Secondary | ICD-10-CM | POA: Diagnosis not present

## 2021-12-18 DIAGNOSIS — R601 Generalized edema: Secondary | ICD-10-CM | POA: Diagnosis not present

## 2021-12-18 LAB — CBC
HCT: 32.5 % — ABNORMAL LOW (ref 36.0–46.0)
Hemoglobin: 9.8 g/dL — ABNORMAL LOW (ref 12.0–15.0)
MCH: 27.5 pg (ref 26.0–34.0)
MCHC: 30.2 g/dL (ref 30.0–36.0)
MCV: 91 fL (ref 80.0–100.0)
Platelets: 160 10*3/uL (ref 150–400)
RBC: 3.57 MIL/uL — ABNORMAL LOW (ref 3.87–5.11)
RDW: 18.2 % — ABNORMAL HIGH (ref 11.5–15.5)
WBC: 10.4 10*3/uL (ref 4.0–10.5)
nRBC: 0 % (ref 0.0–0.2)

## 2021-12-18 LAB — BASIC METABOLIC PANEL
Anion gap: 11 (ref 5–15)
BUN: 68 mg/dL — ABNORMAL HIGH (ref 8–23)
CO2: 30 mmol/L (ref 22–32)
Calcium: 9.7 mg/dL (ref 8.9–10.3)
Chloride: 98 mmol/L (ref 98–111)
Creatinine, Ser: 2.27 mg/dL — ABNORMAL HIGH (ref 0.44–1.00)
GFR, Estimated: 22 mL/min — ABNORMAL LOW (ref >60)
Glucose, Bld: 196 mg/dL — ABNORMAL HIGH (ref 70–99)
Potassium: 3.2 mmol/L — ABNORMAL LOW (ref 3.5–5.1)
Sodium: 139 mmol/L (ref 135–145)

## 2021-12-18 LAB — GLUCOSE, CAPILLARY
Glucose-Capillary: 177 mg/dL — ABNORMAL HIGH (ref 70–99)
Glucose-Capillary: 244 mg/dL — ABNORMAL HIGH (ref 70–99)
Glucose-Capillary: 191 mg/dL — ABNORMAL HIGH (ref 70–99)
Glucose-Capillary: 172 mg/dL — ABNORMAL HIGH (ref 70–99)

## 2021-12-18 MED ORDER — POTASSIUM CHLORIDE CRYS ER 20 MEQ PO TBCR: 40.0000 meq | EXTENDED_RELEASE_TABLET | Freq: Every day | ORAL | Status: DC

## 2021-12-18 MED ORDER — POTASSIUM CHLORIDE CRYS ER 20 MEQ PO TBCR: 40.0000 meq | EXTENDED_RELEASE_TABLET | Freq: Two times a day (BID) | ORAL | Status: DC

## 2021-12-18 MED ORDER — AMOXICILLIN 250 MG PO CAPS
500.0000 mg | ORAL_CAPSULE | Freq: Three times a day (TID) | ORAL | Status: DC
  Administered 2021-12-18 – 2021-12-21 (×10): 500 mg via ORAL
  Filled 2021-12-18 (×10): qty 2

## 2021-12-18 MED ORDER — POTASSIUM CHLORIDE CRYS ER 20 MEQ PO TBCR
40.0000 meq | EXTENDED_RELEASE_TABLET | Freq: Two times a day (BID) | ORAL | Status: AC
  Administered 2021-12-18 (×2): 40 meq via ORAL
  Filled 2021-12-18 (×2): qty 2

## 2021-12-18 MED ORDER — METOLAZONE 5 MG PO TABS
5.0000 mg | ORAL_TABLET | Freq: Once | ORAL | Status: AC
  Administered 2021-12-18: 5 mg via ORAL
  Filled 2021-12-18: qty 1

## 2021-12-18 MED ORDER — FUROSEMIDE 10 MG/ML IJ SOLN
160.0000 mg | Freq: Two times a day (BID) | INTRAVENOUS | Status: DC
  Administered 2021-12-18 – 2021-12-19 (×4): 160 mg via INTRAVENOUS
  Filled 2021-12-18: qty 16
  Filled 2021-12-18 (×2): qty 10
  Filled 2021-12-18: qty 2
  Filled 2021-12-18 (×2): qty 16

## 2021-12-18 NOTE — Progress Notes (Signed)
Cardiology Progress Note  Patient ID: Kristine [name] MRN: [id number] DOB: [birth year] Date of Encounter: 12/18/2021  Primary Cardiologist: Glori Bickers, MD  Subjective   Chief Complaint: Fatigue   HPI: Reports low energy.  Still volume overloaded.  ROS:  All other ROS reviewed and negative. Pertinent positives noted in the HPI.     Inpatient Medications  Scheduled Meds:  allopurinol  300 mg Oral q AM   amoxicillin  500 mg Oral Q8H   apixaban  5 mg Oral BID   insulin aspart  0-15 Units Subcutaneous TID WC   insulin glargine-yfgn  5 Units Subcutaneous Daily   levothyroxine  88 mcg Oral QAC breakfast   metoprolol tartrate  12.5 mg Oral BID   nystatin   Topical TID   mouth rinse  15 mL Mouth Rinse 4 times per day   potassium chloride  40 mEq Oral BID   pregabalin  50 mg Oral BID   Continuous Infusions:  sodium chloride 10 mL/hr at 12/14/21 1700   furosemide 160 mg (12/18/21 0939)   PRN Meds: sodium chloride, acetaminophen **OR** acetaminophen, ALPRAZolam, dextrose, docusate sodium, ondansetron **OR** ondansetron (ZOFRAN) IV, mouth rinse, traMADol   Vital Signs   Vitals:   12/17/21 0618 12/17/21 1327 12/17/21 2049 12/18/21 0653  BP: 113/75 117/80 (!) 142/79 115/89  Pulse: 90 75 95 64  Resp: 19   (!) 22  Temp: 98.7 F (37.1 C) (!) 97.5 F (36.4 C) 97.8 F (36.6 C) (!) 97.4 F (36.3 C)  TempSrc: Oral Oral Oral Oral  SpO2: 94% 97% 99% 91%  Weight:      Height:        Intake/Output Summary (Last 24 hours) at 12/18/2021 1023 Last data filed at 12/18/2021 0941 Gross per 24 hour  Intake 515.75 ml  Output 650 ml  Net -134.25 ml      12/17/2021    5:00 AM 12/16/2021    4:24 AM 12/13/2021   12:38 PM  Last 3 Weights  Weight (lbs) 306 lb 3.5 oz 308 lb 3.3 oz 300 lb  Weight (kg) 138.9 kg 139.8 kg 136.079 kg      Telemetry  Overnight telemetry shows A-fib 90 to 100 bpm, which I personally reviewed.    Physical Exam   Vitals:   12/17/21 0618 12/17/21  1327 12/17/21 2049 12/18/21 0653  BP: 113/75 117/80 (!) 142/79 115/89  Pulse: 90 75 95 64  Resp: 19   (!) 22  Temp: 98.7 F (37.1 C) (!) 97.5 F (36.4 C) 97.8 F (36.6 C) (!) 97.4 F (36.3 C)  TempSrc: Oral Oral Oral Oral  SpO2: 94% 97% 99% 91%  Weight:      Height:        Intake/Output Summary (Last 24 hours) at 12/18/2021 1023 Last data filed at 12/18/2021 0941 Gross per 24 hour  Intake 515.75 ml  Output 650 ml  Net -134.25 ml       12/17/2021    5:00 AM 12/16/2021    4:24 AM 12/13/2021   12:38 PM  Last 3 Weights  Weight (lbs) 306 lb 3.5 oz 308 lb 3.3 oz 300 lb  Weight (kg) 138.9 kg 139.8 kg 136.079 kg    Body mass index is 45.22 kg/m.  General: Ill-appearing Head: Atraumatic, normal size  Eyes: PEERLA, EOMI  Neck: Supple, no JVD Endocrine: No thryomegaly Cardiac: Normal S1, S2; irregular rhythm, no murmurs Lungs: Clear to auscultation bilaterally, no wheezing, rhonchi or rales  Abd: Soft, nontender, no hepatomegaly  Ext: 2+ pitting edema in the thighs and buttocks, 1+ pitting edema in the lower extremities Musculoskeletal: No deformities, BUE and BLE strength normal and equal Skin: Warm and dry, no rashes   Neuro: Alert and oriented to person, place, time, and situation, CNII-XII grossly intact, no focal deficits  Psych: Normal mood and affect   Labs  High Sensitivity Troponin:  No results for input(s): "TROPONINIHS" in the last 720 hours.   Cardiac EnzymesNo results for input(s): "TROPONINI" in the last 168 hours. No results for input(s): "TROPIPOC" in the last 168 hours.  Chemistry Recent Labs  Lab 12/13/21 1433 12/14/21 1323 12/15/21 0357 12/16/21 0423 12/17/21 0418 12/18/21 0406  NA 137 137 139 139 139 139  K 2.9* 3.5 3.9 4.4 3.2* 3.2*  CL 97* 98 101 101 98 98  CO2 '30 30 28 26 28 30  '$ GLUCOSE 52* 173* 177* 172* 201* 196*  BUN 66* 66* 66* 65* 68* 68*  CREATININE 2.35* 2.34* 2.13* 2.16* 2.19* 2.27*  CALCIUM 9.6 9.5 9.4 9.5 9.6 9.7  PROT 7.1 6.8 6.5  --    --   --   ALBUMIN 3.8 3.5 3.5  --   --   --   AST '20 16 18  '$ --   --   --   ALT '9 9 9  '$ --   --   --   ALKPHOS 91 86 81  --   --   --   BILITOT 1.6* 1.5* 1.3*  --   --   --   GFRNONAA 21* 21* 24* 23* 23* 22*  ANIONGAP '10 9 10 12 13 11    '$ Hematology Recent Labs  Lab 12/16/21 0423 12/17/21 0418 12/18/21 0406  WBC 9.6 10.3 10.4  RBC 3.59* 3.51* 3.57*  HGB 10.2* 9.8* 9.8*  HCT 35.5* 32.0* 32.5*  MCV 98.9 91.2 91.0  MCH 28.4 27.9 27.5  MCHC 28.7* 30.6 30.2  RDW 19.1* 18.2* 18.2*  PLT 156 148* 160   BNPNo results for input(s): "BNP", "PROBNP" in the last 168 hours.  DDimer No results for input(s): "DDIMER" in the last 168 hours.   Radiology  MR PELVIS WO CONTRAST  Result Date: 12/17/2021 CLINICAL DATA:  Right adnexal mass incidentally identified by CT EXAM: MRI PELVIS WITHOUT CONTRAST TECHNIQUE: Multiplanar multisequence MR imaging of the pelvis was performed. No intravenous contrast was administered. COMPARISON:  CT chest abdomen pelvis, 12/13/2021 FINDINGS: Urinary Tract:  No abnormality visualized. Bowel: Sigmoid diverticulosis. Otherwise unremarkable visualized pelvic bowel loops. Vascular/Lymphatic: No pathologically enlarged lymph nodes. No significant vascular abnormality seen. Reproductive: Status post hysterectomy. Fluid signal, cystic appearing lesion of the right ovary measuring 3.9 x 3.6 cm (series 5, image 13). Fluid signal cystic lesion of the left ovary measuring 1.8 x 1.0 cm (series 5, image 8). No obvious solid component on noncontrast examination. Other:  Severe anasarca. Musculoskeletal: No suspicious bone lesions identified. IMPRESSION: 1. Fluid signal, cystic appearing lesion of the right ovary measuring 3.9 x 3.6 cm. No obvious solid component on noncontrast examination. 2. Fluid signal cystic appearing lesion of the left ovary measuring 1.8 x 1.0 cm. No obvious solid component on noncontrast examination. 3. Although most likely benign and incidental, these lesions are  incompletely characterized by noncontrast MR, and given nonvisualization at prior ultrasound, contrast enhanced MR surveillance should be considered in 6-12 months to ensure stability. 4. Severe anasarca. 5. Sigmoid diverticulosis. Electronically Signed   By: Delanna Ahmadi M.D.   On: 12/17/2021 08:10    Cardiac  Studies  TTE 12/14/2021  1. D shaped septum with severe cor pulmonale known . Left ventricular  ejection fraction, by estimation, is 45 to 50%. The left ventricle has  mildly decreased function. The left ventricle demonstrates global  hypokinesis. There is mild left ventricular  hypertrophy. Left ventricular diastolic parameters are indeterminate.   2. Patient has known severe RV failure on TTE done 08/08/20. Right  ventricular systolic function is severely reduced. The right ventricular  size is severely enlarged. There is moderately elevated pulmonary artery  systolic pressure.   3. Left atrial size was moderately dilated.   4. A small pericardial effusion is present. The pericardial effusion is  lateral to the left ventricle and posterior to the left ventricle.   5. The mitral valve is abnormal. Trivial mitral valve regurgitation.   6. Tricuspid valve regurgitation is moderate.   Patient Profile  76 year old female with history of HFpEF, cor pulmonale with severe pulm hypertension, permanent atrial fibrillation, CKD stage IV, DVT, polyneuropathy, carpal tunnel, morbid obesity, OSA who was admitted on 12/13/2021 for fall.  Cardiology was consulted for acute volume overload.  Assessment & Plan   #Acute on chronic systolic heart failure #Cor pulmonale #CKD stage IV #Anasarca -Still with significant volume overload.  Unclear how dry she will become.  We will increase her Lasix 160 mg IV twice daily.  Redose metolazone 5 mg today. -Possibly transition oral diuretics tomorrow.  Really needs to consider hospice care.  We will see what we can do with IV diuresis today.  #Persistent  atrial fibrillation -Rates are better.  Continue metoprolol tartrate 12.5 mg twice daily.  Back on Eliquis.    For questions or updates, please contact [facility] Please consult [email or web address] for contact info under   Signed, [place] T. Audie Box, MD, [place]  12/18/2021 10:23 AM

## 2021-12-18 NOTE — Progress Notes (Signed)
Cardiology Progress Note  Patient ID: Kristine [name] MRN: [id number] DOB: 1946-01-25 Date of Encounter: 12/18/2021  Primary Cardiologist: Glori Bickers, MD  Subjective   Chief Complaint: SOB  HPI: Remains grossly volume overloaded, but most of the fluid is abd or below.  Making some headway with diuresis Per her husband, but I/O are incomplete. She has not been out of bed.   ROS:  All other ROS reviewed and negative. Pertinent positives noted in the HPI.     Inpatient Medications  Scheduled Meds:  allopurinol  300 mg Oral q AM   apixaban  5 mg Oral BID   insulin aspart  0-15 Units Subcutaneous TID WC   insulin glargine-yfgn  5 Units Subcutaneous Daily   levothyroxine  88 mcg Oral QAC breakfast   metoprolol tartrate  12.5 mg Oral BID   nystatin   Topical TID   mouth rinse  15 mL Mouth Rinse 4 times per day   potassium chloride  40 mEq Oral BID   pregabalin  50 mg Oral BID   Continuous Infusions:  sodium chloride 10 mL/hr at 12/14/21 1700   ampicillin (OMNIPEN) IV 2 g (12/18/21 0439)   furosemide     PRN Meds: sodium chloride, acetaminophen **OR** acetaminophen, ALPRAZolam, dextrose, docusate sodium, ondansetron **OR** ondansetron (ZOFRAN) IV, mouth rinse, traMADol   Vital Signs   Vitals:   12/17/21 0618 12/17/21 1327 12/17/21 2049 12/18/21 0653  BP: 113/75 117/80 (!) 142/79 115/89  Pulse: 90 75 95 64  Resp: 19   (!) 22  Temp: 98.7 F (37.1 C) (!) 97.5 F (36.4 C) 97.8 F (36.6 C) (!) 97.4 F (36.3 C)  TempSrc: Oral Oral Oral Oral  SpO2: 94% 97% 99% 91%  Weight:      Height:        Intake/Output Summary (Last 24 hours) at 12/18/2021 0802 Last data filed at 12/17/2021 1524 Gross per 24 hour  Intake 161.75 ml  Output 650 ml  Net -488.25 ml      12/17/2021    5:00 AM 12/16/2021    4:24 AM 12/13/2021   12:38 PM  Last 3 Weights  Weight (lbs) 306 lb 3.5 oz 308 lb 3.3 oz 300 lb  Weight (kg) 138.9 kg 139.8 kg 136.079 kg      Telemetry   Atrial fib,  rate ok, PVCs and occ pairs, which I personally reviewed.   ECG   09/23 ECG shows Afib 92 bpm, which I personally reviewed.   Physical Exam   Vitals:   12/17/21 0618 12/17/21 1327 12/17/21 2049 12/18/21 0653  BP: 113/75 117/80 (!) 142/79 115/89  Pulse: 90 75 95 64  Resp: 19   (!) 22  Temp: 98.7 F (37.1 C) (!) 97.5 F (36.4 C) 97.8 F (36.6 C) (!) 97.4 F (36.3 C)  TempSrc: Oral Oral Oral Oral  SpO2: 94% 97% 99% 91%  Weight:      Height:        Intake/Output Summary (Last 24 hours) at 12/18/2021 0802 Last data filed at 12/17/2021 1524 Gross per 24 hour  Intake 161.75 ml  Output 650 ml  Net -488.25 ml       12/17/2021    5:00 AM 12/16/2021    4:24 AM 12/13/2021   12:38 PM  Last 3 Weights  Weight (lbs) 306 lb 3.5 oz 308 lb 3.3 oz 300 lb  Weight (kg) 138.9 kg 139.8 kg 136.079 kg    Body mass index is 45.22 kg/m.  General: Well  nourished, well developed, elderly obese in no acute distress Head: Atraumatic, normal size  Eyes: PEERLA, EOMI  Neck: Supple, no JVD seen, difficult to assess 2nd body habitus Endocrine: No thryomegaly Cardiac: Normal S1, S2; irregular rhythm, no m/r/g Lungs: Clear to auscultation bilaterally, but generally poor resp effort, no wheezing, rhonchi or rales  Abd: Soft, nontender, no hepatomegaly  Ext: Trace lower extremity edema, 2+ thigh edema, dependent edema in the buttocks Musculoskeletal: No deformities, BUE and BLE strength normal and equal Skin: Warm and dry, no rashes   Neuro: Alert and oriented to person, place, time, and situation, CNII-XII grossly intact, no focal deficits  Psych: Normal mood and affect   Labs  High Sensitivity Troponin:  No results for input(s): "TROPONINIHS" in the last 720 hours.   Cardiac EnzymesNo results for input(s): "TROPONINI" in the last 168 hours. No results for input(s): "TROPIPOC" in the last 168 hours.  Chemistry Recent Labs  Lab 12/13/21 1433 12/14/21 1323 12/15/21 0357 12/16/21 0423 12/17/21 0418  12/18/21 0406  NA 137 137 139 139 139 139  K 2.9* 3.5 3.9 4.4 3.2* 3.2*  CL 97* 98 101 101 98 98  CO2 '30 30 28 26 28 30  '$ GLUCOSE 52* 173* 177* 172* 201* 196*  BUN 66* 66* 66* 65* 68* 68*  CREATININE 2.35* 2.34* 2.13* 2.16* 2.19* 2.27*  CALCIUM 9.6 9.5 9.4 9.5 9.6 9.7  PROT 7.1 6.8 6.5  --   --   --   ALBUMIN 3.8 3.5 3.5  --   --   --   AST '20 16 18  '$ --   --   --   ALT '9 9 9  '$ --   --   --   ALKPHOS 91 86 81  --   --   --   BILITOT 1.6* 1.5* 1.3*  --   --   --   GFRNONAA 21* 21* 24* 23* 23* 22*  ANIONGAP '10 9 10 12 13 11    '$ Hematology Recent Labs  Lab 12/16/21 0423 12/17/21 0418 12/18/21 0406  WBC 9.6 10.3 10.4  RBC 3.59* 3.51* 3.57*  HGB 10.2* 9.8* 9.8*  HCT 35.5* 32.0* 32.5*  MCV 98.9 91.2 91.0  MCH 28.4 27.9 27.5  MCHC 28.7* 30.6 30.2  RDW 19.1* 18.2* 18.2*  PLT 156 148* 160   BNPNo results for input(s): "BNP", "PROBNP" in the last 168 hours.  DDimer No results for input(s): "DDIMER" in the last 168 hours.   Radiology  MR PELVIS WO CONTRAST  Result Date: 12/17/2021 CLINICAL DATA:  Right adnexal mass incidentally identified by CT EXAM: MRI PELVIS WITHOUT CONTRAST TECHNIQUE: Multiplanar multisequence MR imaging of the pelvis was performed. No intravenous contrast was administered. COMPARISON:  CT chest abdomen pelvis, 12/13/2021 FINDINGS: Urinary Tract:  No abnormality visualized. Bowel: Sigmoid diverticulosis. Otherwise unremarkable visualized pelvic bowel loops. Vascular/Lymphatic: No pathologically enlarged lymph nodes. No significant vascular abnormality seen. Reproductive: Status post hysterectomy. Fluid signal, cystic appearing lesion of the right ovary measuring 3.9 x 3.6 cm (series 5, image 13). Fluid signal cystic lesion of the left ovary measuring 1.8 x 1.0 cm (series 5, image 8). No obvious solid component on noncontrast examination. Other:  Severe anasarca. Musculoskeletal: No suspicious bone lesions identified. IMPRESSION: 1. Fluid signal, cystic appearing lesion  of the right ovary measuring 3.9 x 3.6 cm. No obvious solid component on noncontrast examination. 2. Fluid signal cystic appearing lesion of the left ovary measuring 1.8 x 1.0 cm. No obvious solid component on noncontrast  examination. 3. Although most likely benign and incidental, these lesions are incompletely characterized by noncontrast MR, and given nonvisualization at prior ultrasound, contrast enhanced MR surveillance should be considered in 6-12 months to ensure stability. 4. Severe anasarca. 5. Sigmoid diverticulosis. Electronically Signed   By: Delanna Ahmadi M.D.   On: 12/17/2021 08:10    Cardiac Studies  TTE 12/14/2021  1. D shaped septum with severe cor pulmonale known . Left ventricular  ejection fraction, by estimation, is 45 to 50%. The left ventricle has  mildly decreased function. The left ventricle demonstrates global  hypokinesis. There is mild left ventricular  hypertrophy. Left ventricular diastolic parameters are indeterminate.   2. Patient has known severe RV failure on TTE done 08/08/20. Right  ventricular systolic function is severely reduced. The right ventricular  size is severely enlarged. There is moderately elevated pulmonary artery  systolic pressure.   3. Left atrial size was moderately dilated.   4. A small pericardial effusion is present. The pericardial effusion is  lateral to the left ventricle and posterior to the left ventricle.   5. The mitral valve is abnormal. Trivial mitral valve regurgitation.   6. Tricuspid valve regurgitation is moderate.   Patient Profile  76 year old female with history of HFpEF, cor pulmonale with severe pulm hypertension, permanent atrial fibrillation, CKD stage IV, DVT, polyneuropathy, carpal tunnel, morbid obesity, OSA who was admitted on 12/13/2021 for fall.  Cardiology was consulted for acute volume overload.  Assessment & Plan   #Acute on chronic systolic heart failure #Cor pulmonale #CKD stage IV #Anasarca -Still with  significant anasarca.  Would recommend to continue with diuresis.  Increase Lasix to 120 mg IV twice daily.  Redose metolazone 5 mg today.   - All of her imaging studies shows severe anasarca, but no fluid collection amenable to paracentesis.  - She is basically bedridden, but needs to increase activity to the point that her husband can manage her at home.  - Hopefully we can get some of this fluid off. -  Kidney function also limits [place], but no sig change after increasing the Lasix and using metolazone (she had that again today).  - I/O are woefully incomplete, wt is down 2 lbs today, to 306. Husband says dry wt is about 302 lbs.  - We will do the best we can.  #Persistent atrial fibrillation #Hematuria/UTI -Admitted with fall.  -  Remains on heparin drip.  - Would like to get her back on Eliquis as we are able, but we have to make sure her fall risk is low enough to do this      For questions or updates, please contact [facility] [place] Please consult [email or web address] for contact info under     Signed, Rosaria Ferries, PA-C 12/18/2021 9:20 AM

## 2021-12-18 NOTE — Progress Notes (Signed)
PROGRESS NOTE    Kristine [name]  QQI:[id number] DOB: 1945-06-13 DOA: 12/13/2021 PCP: Prince Solian, MD    Brief Narrative:  Kristine [name] is a 75 y.o. female with medical history significant of seasonal allergies, asthma, anxiety, depression, chronic atrial fibrillation on apixaban, history of amiodarone toxicity, osteoarthritis, chronic combined systolic and diastolic CHF, community-acquired pneumonia, cataracts, chronic bronchitis, stage IV CKD, dyslipidemia, GERD, hyperlipidemia, mild aortic sclerosis, class III obesity, diabetic peripheral neuropathy, type II DM, osteoporosis, hypothyroidism, sleep apnea, scoliosis, spinal stenosis, vitamin D deficiency, history of other nonhemorrhagic CVA with residual left-sided weakness requiring a walker to ambulate who slipped out of bed falling on her right knee and also injuring her right forearm with a laceration.  She has been having hematuria for the past 3 days. She has also noticed significant lower extremity and abdomen edema.   CT chest/abdomen/pelvis positive for enlargement of previously known pericardial effusion, cardiomegaly, diffuse anasarca, small volume splenic free fluid, right adnexal mass measuring 4.2 x 4.3 cm with recommendations for pelvic ultrasound and aortic atherosclerosis.   She was admitted to [facility] for falls and hematuria. Cardiology consulted for volume overload and pericardial effusion.    Assessment & Plan:   Acute on chronic diastolic congestive heart failure with preserved ejection fraction/anasarca: Advanced right ventricular heart failure with severe pulmonary hypertension and cardiomyopathy. Challenging diuresing.  Cardiology following. Currently remains on Lasix 120 mg IV twice daily, intermittently getting metolazone.  No adequate urine output yet.    Continue intake output monitoring.  Daily weight.  Mobility.  Renal functions monitoring. Followed by palliative care.  May benefit with hospice at  home.  Type 2 diabetes with diabetic neuropathy, hyperglycemia on admission: Hypoglycemia resolved.  A1c 6.3.  Currently remains on Lyrica 50 mg twice daily for diabetic neuropathy. Patient is on Tresiba 26 units at home, held for hypoglycemia. Remains on low-dose insulin.  Persistent A-fib: Heart rate controlled.  On metoprolol.  Back on Eliquis.  Hematuria due to UTI and also due to coagulopathy: Earlier on Rocephin.  Urine culture showing Enterococcus.  Continue amoxicillin.    Abnormal CT scan: MRI done.  Patient has ovarian cyst and adnexal cyst.  Will need follow-up scans.  CKD stage IV: Creatinine at about baseline.  Recheck tomorrow morning.  Right upper extremity/right axillary vein DVT: On Eliquis.  Hypokalemia: Replaced.  We will keep on a scheduled replacement when patient is on Lasix.  Debility and deconditioning: Work with PT OT.  Remains in poor clinical status.    DVT prophylaxis: SCDs Start: 12/14/21 1647 apixaban (ELIQUIS) tablet 5 mg   Code Status: Partial Family Communication: Husband at the bedside Disposition Plan: Status is: Inpatient Remains inpatient appropriate because: IV diuresis, significant symptoms and debility.     Consultants:  Cardiology Palliative care  Procedures:  None  Antimicrobials:  Ampicillin   Subjective: Patient seen and examined.  Not sure how she feels.  Husband at the bedside.  Patient denies any shortness of breath at rest.  Not mobilized today.  Objective: Vitals:   12/17/21 1327 12/17/21 2049 12/18/21 0653 12/18/21 1020  BP: 117/80 (!) 142/79 115/89 125/74  Pulse: 75 95 64 100  Resp:   (!) 22 16  Temp: (!) 97.5 F (36.4 C) 97.8 F (36.6 C) (!) 97.4 F (36.3 C) (!) 97.5 F (36.4 C)  TempSrc: Oral Oral Oral Oral  SpO2: 97% 99% 91% 94%  Weight:      Height:        Intake/Output Summary (  Last 24 hours) at 12/18/2021 1352 Last data filed at 12/18/2021 0941 Gross per 24 hour  Intake 515.75 ml  Output 550  ml  Net -34.25 ml   Filed Weights   12/13/21 1238 12/16/21 0424 12/17/21 0500  Weight: 136.1 kg (!) 139.8 kg (!) 138.9 kg    Examination:  General exam: Appears calm and comfortable.  Chronically sick looking.  Mild distress on talking. Frail and debilitated.  Morbidly obese.  On room air. Laceration right forearm covered with a skin.  Hematoma skin of the right knee. Respiratory system: Poor lateral air entry.  No added sounds. Cardiovascular system: S1 & S2 heard, RRR.  2+ bilateral pedal edema. Gastrointestinal system: Abdomen is nondistended, soft and nontender. No organomegaly or masses felt. Normal bowel sounds heard.  Obese and pendulous. Central nervous system: Alert and oriented. No focal neurological deficits.  Generalized weakness.    Data Reviewed: I have personally reviewed following labs and imaging studies  CBC: Recent Labs  Lab 12/13/21 1433 12/14/21 1323 12/15/21 0357 12/16/21 0423 12/17/21 0418 12/18/21 0406  WBC 11.5* 10.4 10.6* 9.6 10.3 10.4  NEUTROABS 8.9* 8.1*  --   --   --   --   HGB 11.0* 10.1* 10.1* 10.2* 9.8* 9.8*  HCT 35.9* 33.5* 33.6* 35.5* 32.0* 32.5*  MCV 89.1 91.5 91.6 98.9 91.2 91.0  PLT 159 141* 159 156 148* 578   Basic Metabolic Panel: Recent Labs  Lab 12/13/21 1433 12/14/21 1323 12/15/21 0357 12/15/21 0414 12/16/21 0423 12/17/21 0418 12/18/21 0406  NA 137 137 139  --  139 139 139  K 2.9* 3.5 3.9  --  4.4 3.2* 3.2*  CL 97* 98 101  --  101 98 98  CO2 '30 30 28  '$ --  '26 28 30  '$ GLUCOSE 52* 173* 177*  --  172* 201* 196*  BUN 66* 66* 66*  --  65* 68* 68*  CREATININE 2.35* 2.34* 2.13*  --  2.16* 2.19* 2.27*  CALCIUM 9.6 9.5 9.4  --  9.5 9.6 9.7  MG 2.4 2.4  --  2.5* 2.6*  --   --   PHOS  --  4.1  --   --   --   --   --    GFR: Estimated Creatinine Clearance: 32.7 mL/min (A) (by C-G formula based on SCr of 2.27 mg/dL (H)). Liver Function Tests: Recent Labs  Lab 12/13/21 1433 12/14/21 1323 12/15/21 0357  AST '20 16 18  '$ ALT '9 9  9  '$ ALKPHOS 91 86 81  BILITOT 1.6* 1.5* 1.3*  PROT 7.1 6.8 6.5  ALBUMIN 3.8 3.5 3.5   No results for input(s): "LIPASE", "AMYLASE" in the last 168 hours. No results for input(s): "AMMONIA" in the last 168 hours. Coagulation Profile: Recent Labs  Lab 12/15/21 0357  INR 1.4*   Cardiac Enzymes: No results for input(s): "CKTOTAL", "CKMB", "CKMBINDEX", "TROPONINI" in the last 168 hours. BNP (last 3 results) No results for input(s): "PROBNP" in the last 8760 hours. HbA1C: No results for input(s): "HGBA1C" in the last 72 hours.  CBG: Recent Labs  Lab 12/17/21 1121 12/17/21 1715 12/17/21 2055 12/18/21 0802 12/18/21 1119  GLUCAP 288* 280* 212* 177* 244*   Lipid Profile: No results for input(s): "CHOL", "HDL", "LDLCALC", "TRIG", "CHOLHDL", "LDLDIRECT" in the last 72 hours. Thyroid Function Tests: No results for input(s): "TSH", "T4TOTAL", "FREET4", "T3FREE", "THYROIDAB" in the last 72 hours. Anemia Panel: No results for input(s): "VITAMINB12", "FOLATE", "FERRITIN", "TIBC", "IRON", "RETICCTPCT" in the last 72  hours. Sepsis Labs: No results for input(s): "PROCALCITON", "LATICACIDVEN" in the last 168 hours.  Recent Results (from the past 240 hour(s))  Urine Culture     Status: Abnormal   Collection Time: 12/15/21  2:43 PM   Specimen: Urine, Clean Catch  Result Value Ref Range Status   Specimen Description   Final    URINE, CLEAN CATCH Performed at [facility], [place] [address] [place], [place] [address]    Special Requests   Final    NONE Performed at [facility], [place] [address] [place],  [address]    Culture >=100,000 COLONIES/mL ENTEROCOCCUS FAECALIS (A)  Final   Report Status 12/17/2021 FINAL  Final   Organism ID, Bacteria ENTEROCOCCUS FAECALIS (A)  Final      Susceptibility   Enterococcus faecalis - MIC*    AMPICILLIN <=2 SENSITIVE Sensitive     NITROFURANTOIN <=16 SENSITIVE Sensitive     VANCOMYCIN 2 SENSITIVE Sensitive      * >=100,000 COLONIES/mL ENTEROCOCCUS FAECALIS         Radiology Studies: MR PELVIS WO CONTRAST  Result Date: 12/17/2021 CLINICAL DATA:  Right adnexal mass incidentally identified by CT EXAM: MRI PELVIS WITHOUT CONTRAST TECHNIQUE: Multiplanar multisequence MR imaging of the pelvis was performed. No intravenous contrast was administered. COMPARISON:  CT chest abdomen pelvis, 12/13/2021 FINDINGS: Urinary Tract:  No abnormality visualized. Bowel: Sigmoid diverticulosis. Otherwise unremarkable visualized pelvic bowel loops. Vascular/Lymphatic: No pathologically enlarged lymph nodes. No significant vascular abnormality seen. Reproductive: Status post hysterectomy. Fluid signal, cystic appearing lesion of the right ovary measuring 3.9 x 3.6 cm (series 5, image 13). Fluid signal cystic lesion of the left ovary measuring 1.8 x 1.0 cm (series 5, image 8). No obvious solid component on noncontrast examination. Other:  Severe anasarca. Musculoskeletal: No suspicious bone lesions identified. IMPRESSION: 1. Fluid signal, cystic appearing lesion of the right ovary measuring 3.9 x 3.6 cm. No obvious solid component on noncontrast examination. 2. Fluid signal cystic appearing lesion of the left ovary measuring 1.8 x 1.0 cm. No obvious solid component on noncontrast examination. 3. Although most likely benign and incidental, these lesions are incompletely characterized by noncontrast MR, and given nonvisualization at prior ultrasound, contrast enhanced MR surveillance should be considered in 6-12 months to ensure stability. 4. Severe anasarca. 5. Sigmoid diverticulosis. Electronically Signed   By: Delanna Ahmadi M.D.   On: 12/17/2021 08:10        Scheduled Meds:  allopurinol  300 mg Oral q AM   amoxicillin  500 mg Oral Q8H   apixaban  5 mg Oral BID   insulin aspart  0-15 Units Subcutaneous TID WC   insulin glargine-yfgn  5 Units Subcutaneous Daily   levothyroxine  88 mcg Oral QAC breakfast   metoprolol  tartrate  12.5 mg Oral BID   nystatin   Topical TID   mouth rinse  15 mL Mouth Rinse 4 times per day   potassium chloride  40 mEq Oral BID   pregabalin  50 mg Oral BID   Continuous Infusions:  sodium chloride 10 mL/hr at 12/14/21 1700   furosemide 160 mg (12/18/21 0939)     LOS: 4 days    Time spent: 35 minutes    Barb Merino, MD [facility] Pager [phone]

## 2021-12-19 ENCOUNTER — Other Ambulatory Visit (HOSPITAL_COMMUNITY): Payer: Self-pay | Admitting: Internal Medicine

## 2021-12-19 DIAGNOSIS — I5042 Chronic combined systolic (congestive) and diastolic (congestive) heart failure: Secondary | ICD-10-CM | POA: Diagnosis not present

## 2021-12-19 DIAGNOSIS — W19XXXA Unspecified fall, initial encounter: Secondary | ICD-10-CM | POA: Diagnosis not present

## 2021-12-19 DIAGNOSIS — N184 Chronic kidney disease, stage 4 (severe): Secondary | ICD-10-CM | POA: Diagnosis not present

## 2021-12-19 DIAGNOSIS — R601 Generalized edema: Secondary | ICD-10-CM | POA: Diagnosis not present

## 2021-12-19 LAB — GLUCOSE, CAPILLARY
Glucose-Capillary: 193 mg/dL — ABNORMAL HIGH (ref 70–99)
Glucose-Capillary: 232 mg/dL — ABNORMAL HIGH (ref 70–99)
Glucose-Capillary: 260 mg/dL — ABNORMAL HIGH (ref 70–99)
Glucose-Capillary: 216 mg/dL — ABNORMAL HIGH (ref 70–99)

## 2021-12-19 LAB — BASIC METABOLIC PANEL
Anion gap: 11 (ref 5–15)
BUN: 69 mg/dL — ABNORMAL HIGH (ref 8–23)
CO2: 31 mmol/L (ref 22–32)
Calcium: 9.8 mg/dL (ref 8.9–10.3)
Chloride: 98 mmol/L (ref 98–111)
Creatinine, Ser: 2.22 mg/dL — ABNORMAL HIGH (ref 0.44–1.00)
GFR, Estimated: 23 mL/min — ABNORMAL LOW (ref >60)
Glucose, Bld: 177 mg/dL — ABNORMAL HIGH (ref 70–99)
Potassium: 3.6 mmol/L (ref 3.5–5.1)
Sodium: 140 mmol/L (ref 135–145)

## 2021-12-19 LAB — CBC
HCT: 33.3 % — ABNORMAL LOW (ref 36.0–46.0)
Hemoglobin: 9.9 g/dL — ABNORMAL LOW (ref 12.0–15.0)
MCH: 27.3 pg (ref 26.0–34.0)
MCHC: 29.7 g/dL — ABNORMAL LOW (ref 30.0–36.0)
MCV: 91.7 fL (ref 80.0–100.0)
Platelets: 167 10*3/uL (ref 150–400)
RBC: 3.63 MIL/uL — ABNORMAL LOW (ref 3.87–5.11)
RDW: 18.4 % — ABNORMAL HIGH (ref 11.5–15.5)
WBC: 9.3 10*3/uL (ref 4.0–10.5)
nRBC: 0 % (ref 0.0–0.2)

## 2021-12-19 MED ORDER — METOLAZONE 5 MG PO TABS
5.0000 mg | ORAL_TABLET | Freq: Once | ORAL | Status: AC
  Administered 2021-12-19: 5 mg via ORAL
  Filled 2021-12-19 (×2): qty 1

## 2021-12-19 NOTE — Evaluation (Signed)
Occupational Therapy Evaluation Patient Details Name: Kristine [name] MRN: [id number] DOB: 1945/10/04 Today's Date: 12/19/2021   History of Present Illness 76 y.o. female with medical history significant of seasonal allergies, asthma, anxiety, depression, chronic atrial fibrillation on apixaban, history of amiodarone toxicity, osteoarthritis, chronic combined systolic and diastolic CHF, community-acquired pneumonia, cataracts, chronic bronchitis, stage IV CKD, dyslipidemia, GERD, hyperlipidemia mild aortic sclerosis, class III obesity, diabetic peripheral neuropathy, type II DM, osteoporosis, hypothyroidism, sleep apnea, scoliosis, spinal stenosis, vitamin D deficiency, history of other nonhemorrhagic CVA with residual left-sided weakness requiring a walker to ambulate who is slipped out of bed falling on her right knee and also injuring her right forearm with a laceration.  She has been having hematuria for the past 3 days. She has also noticed significant lower extremity and abdomen edema. Dx of CHF.   Clinical Impression   Pt required mod assist to stand using a RW, min guard assist to step-pivot to the bedside chair using a RW, and set-up assist for grooming in sitting at chair level. She was noted to be with general deconditioning, R knee soreness that increased in standing (bruising noted), and slight generalized strength deficits. She was motivated to participate in the therapy session, and she will continue to benefit from further OT services to maximize her safety and independence with ADLs. OT anticipates she would benefit from a short-term sub-acute rehab stays, however her spouse prefers to take her home with home therapy upon discharge.       Recommendations for follow up therapy are one component of a multi-disciplinary discharge planning process, led by the attending physician.  Recommendations may be updated based on patient status, additional functional criteria and insurance  authorization.   Follow Up Recommendations  Skilled nursing-short term rehab, however her spouse prefers for the pt to return home with Home Therapy upon discharge)    Assistance Recommended at Discharge Intermittent Supervision/Assistance  Patient can return home with the following Assistance with cooking/housework;Help with stairs or ramp for entrance;A little help with walking and/or transfers;A lot of help with bathing/dressing/bathroom    Functional Status Assessment  Patient has had a recent decline in their functional status and demonstrates the ability to make significant improvements in function in a reasonable and predictable amount of time.  Equipment Recommendations  None recommended by OT       Precautions / Restrictions Precautions Precautions: Fall Restrictions Weight Bearing Restrictions: No      Mobility Bed Mobility Overal bed mobility: Needs Assistance Bed Mobility: Supine to Sit     Supine to sit: Min assist, HOB elevated          Transfers Overall transfer level: Needs assistance Equipment used: Rolling walker (2 wheels) Transfers: Sit to/from Stand, Bed to chair/wheelchair/BSC Sit to Stand: Mod assist     Step pivot transfers: Min guard     General transfer comment: required cues to push up from transfer surface with at least 1 upper extremity      Balance     Sitting balance-Leahy Scale: Good         Standing balance comment: Min guard assist for dynamic standing balance             ADL either performed or assessed with clinical judgement   ADL   Eating/Feeding: Set up Eating/Feeding Details (indicate cue type and reason): based on clinical judgement Grooming: Set up;Sitting Grooming Details (indicate cue type and reason): she performed hair brushing, face washing, and teeth brushing seated in the  bedside chair         Upper Body Dressing : Minimal assistance   Lower Body Dressing: Maximal assistance                 Pertinent Vitals/Pain Pain Assessment Pain Assessment: Faces Faces Pain Scale: Hurts little more Pain Location: R knee Pain Intervention(s): Repositioned, Limited activity within patient's tolerance     Hand Dominance  (She reported being ambidextrous)   Extremity/Trunk Assessment     Lower Extremity Assessment Lower Extremity Assessment:  (B LE AROM WFL)       Communication Communication Communication: No difficulties   Cognition Arousal/Alertness: Awake/alert Behavior During Therapy: WFL for tasks assessed/performed, Anxious Overall Cognitive Status: Within Functional Limits for tasks assessed                       Home Living Family/patient expects to be discharged to:: Private residence Living Arrangements: Spouse/significant other   Type of Home: House Home Access: Ramped entrance     Home Layout: Able to live on main level with bedroom/bathroom;Two level     Bathroom Shower/Tub: Occupational psychologist: Handicapped height Bathroom Accessibility: Yes   Home Equipment: Conservation officer, nature (2 wheels);Rollator (4 wheels);BSC/3in1;Grab bars - toilet;Grab bars - tub/shower;Wheelchair - manual;Hospital bed;Wheelchair - power          Prior Functioning/Environment Prior Level of Function : Needs assist             Mobility Comments: Spouse is present for all mobility. Walks with RW ADLs Comments: Needs assist with lower body ADLs, her spouse managed the cooking and cleaning, and she does not drive.        OT Problem List: Decreased strength;Decreased activity tolerance;Impaired balance (sitting and/or standing);Pain      OT Treatment/Interventions: Self-care/ADL training;Therapeutic exercise;Energy conservation;DME and/or AE instruction;Therapeutic activities;Patient/family education;Balance training    OT Goals(Current goals can be found in the care plan section) Acute Rehab OT Goals Patient Stated Goal: Pt did not specifically  state OT Goal Formulation: With patient/family Time For Goal Achievement: 01/02/22 Potential to Achieve Goals: Good ADL Goals Pt Will Perform Grooming: with supervision;standing Pt Will Perform Upper Body Dressing: with set-up;sitting Pt Will Perform Lower Body Dressing: with min guard assist;sit to/from stand;with adaptive equipment Pt Will Transfer to Toilet: with supervision;ambulating Pt Will Perform Toileting - Clothing Manipulation and hygiene: with supervision;sit to/from stand  OT Frequency: Min 2X/week       AM-PAC OT "6 Clicks" Daily Activity     Outcome Measure Help from another person eating meals?: None Help from another person taking care of personal grooming?: None Help from another person toileting, which includes using toliet, bedpan, or urinal?: A Lot Help from another person bathing (including washing, rinsing, drying)?: A Lot Help from another person to put on and taking off regular upper body clothing?: A Little Help from another person to put on and taking off regular lower body clothing?: A Lot 6 Click Score: 17   End of Session Equipment Utilized During Treatment: Rolling walker (2 wheels) Nurse Communication:  (Nurse cleared the pt for participation in the session)  Activity Tolerance: Patient tolerated treatment well Patient left: in chair;with call bell/phone within reach;with chair alarm set;with family/visitor present  OT Visit Diagnosis: Unsteadiness on feet (R26.81);Muscle weakness (generalized) (M62.81)                Time: 6237-6283 OT Time Calculation (min): 41 min Charges:  OT General Charges $OT  Visit: 1 Visit OT Evaluation $OT Eval Moderate Complexity: 1 Mod OT Treatments $Self Care/Home Management : 8-22 mins $Therapeutic Activity: 8-22 mins    Leota Sauers, OTR/L 12/19/2021, 10:22 AM

## 2021-12-19 NOTE — Progress Notes (Signed)
Cardiology Progress Note  Patient ID: Kristine [name] MRN: [id number] DOB: 09/13/1945 Date of Encounter: 12/19/2021  Primary Cardiologist: Glori Bickers, MD  Subjective   Chief Complaint: None.  HPI: Good urine output still with significant thigh and buttock edema.  Kidney function remaining stable.  ROS:  All other ROS reviewed and negative. Pertinent positives noted in the HPI.     Inpatient Medications  Scheduled Meds:  allopurinol  300 mg Oral q AM   amoxicillin  500 mg Oral Q8H   apixaban  5 mg Oral BID   insulin aspart  0-15 Units Subcutaneous TID WC   insulin glargine-yfgn  5 Units Subcutaneous Daily   levothyroxine  88 mcg Oral QAC breakfast   metolazone  5 mg Oral Once   metoprolol tartrate  12.5 mg Oral BID   nystatin   Topical TID   mouth rinse  15 mL Mouth Rinse 4 times per day   pregabalin  50 mg Oral BID   Continuous Infusions:  sodium chloride 10 mL/hr at 12/14/21 1700   furosemide 160 mg (12/19/21 0901)   PRN Meds: sodium chloride, acetaminophen **OR** acetaminophen, ALPRAZolam, dextrose, docusate sodium, ondansetron **OR** ondansetron (ZOFRAN) IV, mouth rinse, traMADol   Vital Signs   Vitals:   12/18/21 0653 12/18/21 1020 12/18/21 2025 12/19/21 0552  BP: 115/89 125/74 119/65 119/75  Pulse: 64 100 74 99  Resp: (!) 22 16    Temp: (!) 97.4 F (36.3 C) (!) 97.5 F (36.4 C) 97.6 F (36.4 C) 97.7 F (36.5 C)  TempSrc: Oral Oral Oral Oral  SpO2: 91% 94% 98%   Weight:    (!) 141.8 kg  Height:        Intake/Output Summary (Last 24 hours) at 12/19/2021 0957 Last data filed at 12/19/2021 0901 Gross per 24 hour  Intake 50 ml  Output 1450 ml  Net -1400 ml      12/19/2021    5:52 AM 12/17/2021    5:00 AM 12/16/2021    4:24 AM  Last 3 Weights  Weight (lbs) 312 lb 9.8 oz 306 lb 3.5 oz 308 lb 3.3 oz  Weight (kg) 141.8 kg 138.9 kg 139.8 kg      Telemetry  Overnight telemetry shows A-fib 90s to 100s, which I personally reviewed.     Physical Exam   Vitals:   12/18/21 0653 12/18/21 1020 12/18/21 2025 12/19/21 0552  BP: 115/89 125/74 119/65 119/75  Pulse: 64 100 74 99  Resp: (!) 22 16    Temp: (!) 97.4 F (36.3 C) (!) 97.5 F (36.4 C) 97.6 F (36.4 C) 97.7 F (36.5 C)  TempSrc: Oral Oral Oral Oral  SpO2: 91% 94% 98%   Weight:    (!) 141.8 kg  Height:        Intake/Output Summary (Last 24 hours) at 12/19/2021 0957 Last data filed at 12/19/2021 0901 Gross per 24 hour  Intake 50 ml  Output 1450 ml  Net -1400 ml       12/19/2021    5:52 AM 12/17/2021    5:00 AM 12/16/2021    4:24 AM  Last 3 Weights  Weight (lbs) 312 lb 9.8 oz 306 lb 3.5 oz 308 lb 3.3 oz  Weight (kg) 141.8 kg 138.9 kg 139.8 kg    Body mass index is 46.16 kg/m.  General: Well nourished, well developed, in no acute distress Head: Atraumatic, normal size  Eyes: PEERLA, EOMI  Neck: Supple, no JVD Endocrine: No thryomegaly Cardiac: Normal S1, S2;  irregular rhythm Lungs: Breath sounds bilaterally Abd: Soft, nontender, no hepatomegaly  Ext: 1+ thigh and buttock edema Musculoskeletal: No deformities, BUE and BLE strength normal and equal Skin: Warm and dry, no rashes   Neuro: Alert and oriented to person, place, time, and situation, CNII-XII grossly intact, no focal deficits  Psych: Normal mood and affect   Labs  High Sensitivity Troponin:  No results for input(s): "TROPONINIHS" in the last 720 hours.   Cardiac EnzymesNo results for input(s): "TROPONINI" in the last 168 hours. No results for input(s): "TROPIPOC" in the last 168 hours.  Chemistry Recent Labs  Lab 12/13/21 1433 12/14/21 1323 12/15/21 0357 12/16/21 0423 12/17/21 0418 12/18/21 0406 12/19/21 0432  NA 137 137 139   < > 139 139 140  K 2.9* 3.5 3.9   < > 3.2* 3.2* 3.6  CL 97* 98 101   < > 98 98 98  CO2 '30 30 28   '$ < > '28 30 31  '$ GLUCOSE 52* 173* 177*   < > 201* 196* 177*  BUN 66* 66* 66*   < > 68* 68* 69*  CREATININE 2.35* 2.34* 2.13*   < > 2.19* 2.27* 2.22*  CALCIUM  9.6 9.5 9.4   < > 9.6 9.7 9.8  PROT 7.1 6.8 6.5  --   --   --   --   ALBUMIN 3.8 3.5 3.5  --   --   --   --   AST '20 16 18  '$ --   --   --   --   ALT '9 9 9  '$ --   --   --   --   ALKPHOS 91 86 81  --   --   --   --   BILITOT 1.6* 1.5* 1.3*  --   --   --   --   GFRNONAA 21* 21* 24*   < > 23* 22* 23*  ANIONGAP '10 9 10   '$ < > '13 11 11   '$ < > = values in this interval not displayed.    Hematology Recent Labs  Lab 12/17/21 0418 12/18/21 0406 12/19/21 0432  WBC 10.3 10.4 9.3  RBC 3.51* 3.57* 3.63*  HGB 9.8* 9.8* 9.9*  HCT 32.0* 32.5* 33.3*  MCV 91.2 91.0 91.7  MCH 27.9 27.5 27.3  MCHC 30.6 30.2 29.7*  RDW 18.2* 18.2* 18.4*  PLT 148* 160 167   BNPNo results for input(s): "BNP", "PROBNP" in the last 168 hours.  DDimer No results for input(s): "DDIMER" in the last 168 hours.   Radiology  No results found.  Cardiac Studies  TTE 12/14/2021  1. D shaped septum with severe cor pulmonale known . Left ventricular  ejection fraction, by estimation, is 45 to 50%. The left ventricle has  mildly decreased function. The left ventricle demonstrates global  hypokinesis. There is mild left ventricular  hypertrophy. Left ventricular diastolic parameters are indeterminate.   2. Patient has known severe RV failure on TTE done 08/08/20. Right  ventricular systolic function is severely reduced. The right ventricular  size is severely enlarged. There is moderately elevated pulmonary artery  systolic pressure.   3. Left atrial size was moderately dilated.   4. A small pericardial effusion is present. The pericardial effusion is  lateral to the left ventricle and posterior to the left ventricle.   5. The mitral valve is abnormal. Trivial mitral valve regurgitation.   6. Tricuspid valve regurgitation is moderate.   Patient Profile  76 year old female with  history of HFpEF, cor pulmonale with severe pulm hypertension, permanent atrial fibrillation, CKD stage IV, DVT, polyneuropathy, carpal tunnel, morbid  obesity, OSA who was admitted on 12/13/2021 for fall.  Cardiology was consulted for acute volume overload.  Assessment & Plan   #Acute on chronic systolic heart failure #Cor pulmonale #CKD stage IV #Anasarca -Kidney function remaining stable.  Still with volume overload but improving.  Would recommend 1 more day of IV diuresis 160 mg IV twice daily.  Metolazone 5 mg as a one-time dose. -We will get her as dry as we can.  Transition to oral diuretics tomorrow. -Hospice care would be appropriate but she is not wanting to do that. -Mainstay of treatment is palliative with diuresis.  #Persistent atrial fibrillation -Rate can trolled with metoprolol 12.5 mg twice daily.  Continue Eliquis.    For questions or updates, please contact [place] Please consult [email or web address] for contact info under   Signed, [place] T. Audie Box, MD, [place]  12/19/2021 9:57 AM

## 2021-12-19 NOTE — Progress Notes (Signed)
Physical Therapy Treatment Patient Details Name: Kristine [name] MRN: [id number] DOB: 10-22-1945 Today's Date: 12/19/2021   History of Present Illness 76 y.o. female with medical history significant of seasonal allergies, asthma, anxiety, depression, chronic atrial fibrillation on apixaban, history of amiodarone toxicity, osteoarthritis, chronic combined systolic and diastolic CHF, community-acquired pneumonia, cataracts, chronic bronchitis, stage IV CKD, dyslipidemia, GERD, hyperlipidemia mild aortic sclerosis, class III obesity, diabetic peripheral neuropathy, type II DM, osteoporosis, hypothyroidism, sleep apnea, scoliosis, spinal stenosis, vitamin D deficiency, history of other nonhemorrhagic CVA with residual left-sided weakness requiring a walker to ambulate who is slipped out of bed falling on her right knee and also injuring her right forearm with a laceration.  She has been having hematuria for the past 3 days. She has also noticed significant lower extremity and abdomen edema. Dx of CHF.    PT Comments    Pt OOB in recliner via OT earlier today.  Spouse at bedside.  Assisted pt from recliner to [facility] then back to bed.  Pt had difficulty self rising from recliner level.  Used gait belt to assist with forward weight shift.  Assisted on/off BSC required increased time and + 2 asisst.  Assisted with peri care.  Then pt took a few back steps to bed.  General bed mobility comments: required Max/Total Assist back to bed to support B LE and assist with upper body. Positioned to comfort. Pt plans to D/C to home with Spouse.  Pt will need PTAR transport home.  Spouse stated, "That's fine, that's what we did last time".    Recommendations for follow up therapy are one component of a multi-disciplinary discharge planning process, led by the attending physician.  Recommendations may be updated based on patient status, additional functional criteria and insurance authorization.  Follow Up  Recommendations  Home health PT  PTAR   Assistance Recommended at Discharge Intermittent Supervision/Assistance  Patient can return home with the following A little help with walking and/or transfers;A little help with bathing/dressing/bathroom;Assistance with cooking/housework;Assist for transportation;Help with stairs or ramp for entrance   Equipment Recommendations  None recommended by PT    Recommendations for Other Services       Precautions / Restrictions Precautions Precautions: Fall Precaution Comments: multiple falls at home Restrictions Weight Bearing Restrictions: No     Mobility  Bed Mobility Overal bed mobility: Needs Assistance Bed Mobility: Sit to Supine       Sit to supine: Max assist, Total assist   General bed mobility comments: required Max/Total Assist back to bed to support B LE and assist with upper body.    Transfers Overall transfer level: Needs assistance Equipment used: Rolling walker (2 wheels) Transfers: Sit to/from Stand, Bed to chair/wheelchair/BSC Sit to Stand: Max assist, +2 physical assistance, +2 safety/equipment           General transfer comment: pt required Max Asisst + 2 this afternoon from recliner then to and from [facility].    Ambulation/Gait               General Gait Details: only was able to take a few pivot steps due to R knee pain and effort.   Stairs             Wheelchair Mobility    Modified Rankin (Stroke Patients Only)       Balance  Cognition Arousal/Alertness: Awake/alert   Overall Cognitive Status: Within Functional Limits for tasks assessed                                 General Comments: AxO x 3 very sweet Lady pleasant but also emotional/tearful this session.        Exercises      General Comments        Pertinent Vitals/Pain Pain Assessment Pain Assessment: Faces Faces Pain Scale: Hurts little  more Pain Location: R knee Pain Descriptors / Indicators: Aching, Tender Pain Intervention(s): Monitored during session    Home Living                          Prior Function            PT Goals (current goals can now be found in the care plan section) Progress towards PT goals: Progressing toward goals    Frequency    Min 3X/week      PT Plan Current plan remains appropriate    Co-evaluation              AM-PAC PT "6 Clicks" Mobility   Outcome Measure  Help needed turning from your back to your side while in a flat bed without using bedrails?: A Lot Help needed moving from lying on your back to sitting on the side of a flat bed without using bedrails?: A Lot Help needed moving to and from a bed to a chair (including a wheelchair)?: A Lot Help needed standing up from a chair using your arms (e.g., wheelchair or bedside chair)?: A Lot Help needed to walk in hospital room?: A Lot Help needed climbing 3-5 steps with a railing? : Total 6 Click Score: 11    End of Session Equipment Utilized During Treatment: Gait belt Activity Tolerance: Patient limited by fatigue Patient left: in chair;with nursing/sitter in room;with call bell/phone within reach;with family/visitor present Nurse Communication: Mobility status PT Visit Diagnosis: Difficulty in walking, not elsewhere classified (R26.2)     Time: 4496-7591 PT Time Calculation (min) (ACUTE ONLY): 26 min  Charges:  $Therapeutic Activity: 23-37 mins                     Rica Koyanagi  PTA [place] [facility] M-F          [phone] Weekend pager [phone]

## 2021-12-19 NOTE — Progress Notes (Signed)
PROGRESS NOTE    Kristine [name]  CVE:[id number] DOB: 1945/10/12 DOA: 12/13/2021 PCP: Prince Solian, MD    Brief Narrative:  Kristine [name] is a 76 y.o. female with medical history significant of seasonal allergies, asthma, anxiety, depression, chronic atrial fibrillation on apixaban, history of amiodarone toxicity, osteoarthritis, chronic combined systolic and diastolic CHF, community-acquired pneumonia, cataracts, chronic bronchitis, stage IV CKD, dyslipidemia, GERD, hyperlipidemia, mild aortic sclerosis, class III obesity, diabetic peripheral neuropathy, type II DM, osteoporosis, hypothyroidism, sleep apnea, scoliosis, spinal stenosis, vitamin D deficiency, history of other nonhemorrhagic CVA with residual left-sided weakness requiring a walker to ambulate who slipped out of bed falling on her right knee and also injuring her right forearm with a laceration.  She has been having hematuria for the past 3 days. She has also noticed significant lower extremity and abdomen edema.   CT chest/abdomen/pelvis positive for enlargement of previously known pericardial effusion, cardiomegaly, diffuse anasarca, small volume splenic free fluid, right adnexal mass measuring 4.2 x 4.3 cm with recommendations for pelvic ultrasound and aortic atherosclerosis.   She was admitted to [facility] for falls and hematuria. Cardiology consulted for volume overload and pericardial effusion.    Assessment & Plan:   Acute on chronic diastolic congestive heart failure with preserved ejection fraction/anasarca: Advanced right ventricular heart failure with severe pulmonary hypertension and cardiomyopathy. Challenging diuresing.  Cardiology following. Currently remains on Lasix 120 mg IV twice daily, intermittently getting metolazone.   Continue intake output monitoring.  Daily weight.  Mobility.  Renal functions monitoring. Followed by palliative care.  May benefit with hospice at home.  Type 2 diabetes with  diabetic neuropathy, hyperglycemia on admission: Hypoglycemia resolved.  A1c 6.3.  Currently remains on Lyrica 50 mg twice daily for diabetic neuropathy. Patient is on Tresiba 26 units at home, held for hypoglycemia. Remains on low-dose insulin.  Persistent A-fib: Heart rate controlled.  On metoprolol.  Back on Eliquis.  Hematuria due to UTI and also due to coagulopathy: Earlier on Rocephin.  Urine culture showing Enterococcus.  Continue amoxicillin.    Abnormal CT scan: MRI done.  Patient has ovarian cyst and adnexal cyst.  Will need follow-up scans.  CKD stage IV: Creatinine at about baseline.  Recheck tomorrow morning.  Right upper extremity/right axillary vein DVT: On Eliquis.  Hypokalemia: Replaced.  We will keep on a scheduled replacement when patient is on Lasix.  Debility and deconditioning: Work with PT OT.  Remains in poor clinical status.  Discussed with husband at bedside.  PT OT recommended skilled nursing facility placement.  She wants to go home. Will prescribe home health PT OT.  Patient also with significant mobility, requiring frequent repositioning and will need DME at home.  She will benefit with hospital bed at home.    DVT prophylaxis: SCDs Start: 12/14/21 1647 apixaban (ELIQUIS) tablet 5 mg   Code Status: Partial Family Communication: Husband at the bedside Disposition Plan: Status is: Inpatient Remains inpatient appropriate because: IV diuresis, significant symptoms and debility.     Consultants:  Cardiology Palliative care  Procedures:  None  Antimicrobials:  Ampicillin   Subjective: Seen and examined.  Needed 2 people to put her in the chair.  For historian.  Denies any complaints.  Objective: Vitals:   12/18/21 0653 12/18/21 1020 12/18/21 2025 12/19/21 0552  BP: 115/89 125/74 119/65 119/75  Pulse: 64 100 74 99  Resp: (!) 22 16    Temp: (!) 97.4 F (36.3 C) (!) 97.5 F (36.4 C) 97.6 F (36.4 C)  97.7 F (36.5 C)  TempSrc: Oral Oral  Oral Oral  SpO2: 91% 94% 98%   Weight:    (!) 141.8 kg  Height:        Intake/Output Summary (Last 24 hours) at 12/19/2021 1315 Last data filed at 12/19/2021 1049 Gross per 24 hour  Intake 730 ml  Output 1450 ml  Net -720 ml    Filed Weights   12/16/21 0424 12/17/21 0500 12/19/21 0552  Weight: (!) 139.8 kg (!) 138.9 kg (!) 141.8 kg    Examination:  General exam: Chronically sick looking.  Frail and debilitated.  On room air.  Sitting in chair today. Laceration right forearm covered with a skin.  Hematoma skin of the right knee. Respiratory system: Poor lateral air entry.  No added sounds. Cardiovascular system: S1 & S2 heard, RRR.  2+ bilateral pedal edema. Gastrointestinal system: Abdomen is nondistended, soft and nontender. No organomegaly or masses felt. Normal bowel sounds heard.  Obese and pendulous. Central nervous system: Alert and oriented. No focal neurological deficits.  Generalized weakness.    Data Reviewed: I have personally reviewed following labs and imaging studies  CBC: Recent Labs  Lab 12/13/21 1433 12/14/21 1323 12/15/21 0357 12/16/21 0423 12/17/21 0418 12/18/21 0406 12/19/21 0432  WBC 11.5* 10.4 10.6* 9.6 10.3 10.4 9.3  NEUTROABS 8.9* 8.1*  --   --   --   --   --   HGB 11.0* 10.1* 10.1* 10.2* 9.8* 9.8* 9.9*  HCT 35.9* 33.5* 33.6* 35.5* 32.0* 32.5* 33.3*  MCV 89.1 91.5 91.6 98.9 91.2 91.0 91.7  PLT 159 141* 159 156 148* 160 277    Basic Metabolic Panel: Recent Labs  Lab 12/13/21 1433 12/14/21 1323 12/15/21 0357 12/15/21 0414 12/16/21 0423 12/17/21 0418 12/18/21 0406 12/19/21 0432  NA 137 137 139  --  139 139 139 140  K 2.9* 3.5 3.9  --  4.4 3.2* 3.2* 3.6  CL 97* 98 101  --  101 98 98 98  CO2 '30 30 28  '$ --  '26 28 30 31  '$ GLUCOSE 52* 173* 177*  --  172* 201* 196* 177*  BUN 66* 66* 66*  --  65* 68* 68* 69*  CREATININE 2.35* 2.34* 2.13*  --  2.16* 2.19* 2.27* 2.22*  CALCIUM 9.6 9.5 9.4  --  9.5 9.6 9.7 9.8  MG 2.4 2.4  --  2.5* 2.6*  --    --   --   PHOS  --  4.1  --   --   --   --   --   --     GFR: Estimated Creatinine Clearance: 33.8 mL/min (A) (by C-G formula based on SCr of 2.22 mg/dL (H)). Liver Function Tests: Recent Labs  Lab 12/13/21 1433 12/14/21 1323 12/15/21 0357  AST '20 16 18  '$ ALT '9 9 9  '$ ALKPHOS 91 86 81  BILITOT 1.6* 1.5* 1.3*  PROT 7.1 6.8 6.5  ALBUMIN 3.8 3.5 3.5    No results for input(s): "LIPASE", "AMYLASE" in the last 168 hours. No results for input(s): "AMMONIA" in the last 168 hours. Coagulation Profile: Recent Labs  Lab 12/15/21 0357  INR 1.4*    Cardiac Enzymes: No results for input(s): "CKTOTAL", "CKMB", "CKMBINDEX", "TROPONINI" in the last 168 hours. BNP (last 3 results) No results for input(s): "PROBNP" in the last 8760 hours. HbA1C: No results for input(s): "HGBA1C" in the last 72 hours.  CBG: Recent Labs  Lab 12/18/21 1119 12/18/21 1645 12/18/21 2029 12/19/21 8242 12/19/21  1148  GLUCAP 244* 191* 172* 193* 232*    Lipid Profile: No results for input(s): "CHOL", "HDL", "LDLCALC", "TRIG", "CHOLHDL", "LDLDIRECT" in the last 72 hours. Thyroid Function Tests: No results for input(s): "TSH", "T4TOTAL", "FREET4", "T3FREE", "THYROIDAB" in the last 72 hours. Anemia Panel: No results for input(s): "VITAMINB12", "FOLATE", "FERRITIN", "TIBC", "IRON", "RETICCTPCT" in the last 72 hours. Sepsis Labs: No results for input(s): "PROCALCITON", "LATICACIDVEN" in the last 168 hours.  Recent Results (from the past 240 hour(s))  Urine Culture     Status: Abnormal   Collection Time: 12/15/21  2:43 PM   Specimen: Urine, Clean Catch  Result Value Ref Range Status   Specimen Description   Final    URINE, CLEAN CATCH Performed at [facility], [place] [address], [place], [place] [address]    Special Requests   Final    NONE Performed at [facility] [address] [facility], [place] [address], [place], [place] [address]    Culture >=100,000 COLONIES/mL ENTEROCOCCUS FAECALIS  (A)  Final   Report Status 12/17/2021 FINAL  Final   Organism ID, Bacteria ENTEROCOCCUS FAECALIS (A)  Final      Susceptibility   Enterococcus faecalis - MIC*    AMPICILLIN <=2 SENSITIVE Sensitive     NITROFURANTOIN <=16 SENSITIVE Sensitive     VANCOMYCIN 2 SENSITIVE Sensitive     * >=100,000 COLONIES/mL ENTEROCOCCUS FAECALIS         Radiology Studies: No results found.      Scheduled Meds:  allopurinol  300 mg Oral q AM   amoxicillin  500 mg Oral Q8H   apixaban  5 mg Oral BID   insulin aspart  0-15 Units Subcutaneous TID WC   insulin glargine-yfgn  5 Units Subcutaneous Daily   levothyroxine  88 mcg Oral QAC breakfast   metoprolol tartrate  12.5 mg Oral BID   nystatin   Topical TID   mouth rinse  15 mL Mouth Rinse 4 times per day   pregabalin  50 mg Oral BID   Continuous Infusions:  sodium chloride 10 mL/hr at 12/14/21 1700   furosemide 160 mg (12/19/21 0901)     LOS: 5 days    Time spent: 35 minutes    Barb Merino, MD [facility] Pager [phone]

## 2021-12-19 NOTE — Care Management (Signed)
    Durable Medical Equipment  (From admission, onward)           Start     Ordered   12/19/21 1126  For home use only DME Hospital bed  Once       Question Answer Comment  Length of Need Lifetime   Patient has (list medical condition): heart failure   The above medical condition requires: Patient requires the ability to reposition frequently   Head must be elevated greater than: 30 degrees   Bed type Semi-electric   Support Surface: Low Air loss Mattress      12/19/21 1125

## 2021-12-20 ENCOUNTER — Inpatient Hospital Stay (HOSPITAL_COMMUNITY): Payer: Medicare HMO

## 2021-12-20 DIAGNOSIS — N184 Chronic kidney disease, stage 4 (severe): Secondary | ICD-10-CM | POA: Diagnosis not present

## 2021-12-20 DIAGNOSIS — I5042 Chronic combined systolic (congestive) and diastolic (congestive) heart failure: Secondary | ICD-10-CM | POA: Diagnosis not present

## 2021-12-20 DIAGNOSIS — R601 Generalized edema: Secondary | ICD-10-CM | POA: Diagnosis not present

## 2021-12-20 LAB — BASIC METABOLIC PANEL
Anion gap: 10 (ref 5–15)
BUN: 71 mg/dL — ABNORMAL HIGH (ref 8–23)
CO2: 30 mmol/L (ref 22–32)
Calcium: 9.6 mg/dL (ref 8.9–10.3)
Chloride: 97 mmol/L — ABNORMAL LOW (ref 98–111)
Creatinine, Ser: 2.3 mg/dL — ABNORMAL HIGH (ref 0.44–1.00)
GFR, Estimated: 22 mL/min — ABNORMAL LOW (ref >60)
Glucose, Bld: 248 mg/dL — ABNORMAL HIGH (ref 70–99)
Potassium: 3.2 mmol/L — ABNORMAL LOW (ref 3.5–5.1)
Sodium: 137 mmol/L (ref 135–145)

## 2021-12-20 LAB — CBC
HCT: 31.2 % — ABNORMAL LOW (ref 36.0–46.0)
Hemoglobin: 9.3 g/dL — ABNORMAL LOW (ref 12.0–15.0)
MCH: 27 pg (ref 26.0–34.0)
MCHC: 29.8 g/dL — ABNORMAL LOW (ref 30.0–36.0)
MCV: 90.7 fL (ref 80.0–100.0)
Platelets: 164 10*3/uL (ref 150–400)
RBC: 3.44 MIL/uL — ABNORMAL LOW (ref 3.87–5.11)
RDW: 18.3 % — ABNORMAL HIGH (ref 11.5–15.5)
WBC: 9.5 10*3/uL (ref 4.0–10.5)
nRBC: 0 % (ref 0.0–0.2)

## 2021-12-20 LAB — GLUCOSE, CAPILLARY
Glucose-Capillary: 235 mg/dL — ABNORMAL HIGH (ref 70–99)
Glucose-Capillary: 249 mg/dL — ABNORMAL HIGH (ref 70–99)
Glucose-Capillary: 217 mg/dL — ABNORMAL HIGH (ref 70–99)
Glucose-Capillary: 243 mg/dL — ABNORMAL HIGH (ref 70–99)

## 2021-12-20 MED ORDER — POTASSIUM CHLORIDE CRYS ER 20 MEQ PO TBCR
40.0000 meq | EXTENDED_RELEASE_TABLET | Freq: Two times a day (BID) | ORAL | Status: DC
  Administered 2021-12-20 – 2021-12-21 (×3): 40 meq via ORAL
  Filled 2021-12-20 (×3): qty 2

## 2021-12-20 MED ORDER — BUMETANIDE 1 MG PO TABS
4.0000 mg | ORAL_TABLET | Freq: Every day | ORAL | Status: DC
  Administered 2021-12-20 – 2021-12-21 (×2): 4 mg via ORAL
  Filled 2021-12-20 (×2): qty 4

## 2021-12-20 MED ORDER — GUAIFENESIN 100 MG/5ML PO LIQD
10.0000 mL | Freq: Four times a day (QID) | ORAL | Status: DC | PRN
  Administered 2021-12-21: 10 mL via ORAL
  Filled 2021-12-20: qty 10

## 2021-12-20 NOTE — Progress Notes (Signed)
Cardiology Progress Note   Patient ID: Kristine [name] MRN: [id number] DOB: 1945-12-14 Date of Encounter: 12/20/2021  Primary Cardiologist: Glori Bickers, MD  Subjective   No overnight events- almost net even I's/O's. Weight yesterday was 141.8 kg.  Inpatient Medications  Scheduled Meds:  allopurinol  300 mg Oral q AM   amoxicillin  500 mg Oral Q8H   apixaban  5 mg Oral BID   insulin aspart  0-15 Units Subcutaneous TID WC   insulin glargine-yfgn  5 Units Subcutaneous Daily   levothyroxine  88 mcg Oral QAC breakfast   metoprolol tartrate  12.5 mg Oral BID   nystatin   Topical TID   mouth rinse  15 mL Mouth Rinse 4 times per day   pregabalin  50 mg Oral BID   Continuous Infusions:  sodium chloride 10 mL/hr at 12/14/21 1700   furosemide 160 mg (12/19/21 2200)   PRN Meds: sodium chloride, acetaminophen **OR** acetaminophen, ALPRAZolam, dextrose, docusate sodium, ondansetron **OR** ondansetron (ZOFRAN) IV, mouth rinse, traMADol   Vital Signs   Vitals:   12/19/21 1403 12/19/21 1447 12/19/21 2106 12/20/21 0506  BP: 101/67 124/72 117/72 115/68  Pulse: 95 92 84 91  Resp: '20 18 17 19  '$ Temp: 97.9 F (36.6 C) 98.5 F (36.9 C) 98 F (36.7 C) 97.8 F (36.6 C)  TempSrc: Oral Oral Oral Oral  SpO2: 96% 98% 100% 94%  Weight:      Height:        Intake/Output Summary (Last 24 hours) at 12/20/2021 0833 Last data filed at 12/20/2021 0600 Gross per 24 hour  Intake 940 ml  Output 950 ml  Net -10 ml      12/19/2021    5:52 AM 12/17/2021    5:00 AM 12/16/2021    4:24 AM  Last 3 Weights  Weight (lbs) 312 lb 9.8 oz 306 lb 3.5 oz 308 lb 3.3 oz  Weight (kg) 141.8 kg 138.9 kg 139.8 kg      Telemetry   Afib with CVR - personally reviewed  Physical Exam   Vitals:   12/19/21 1403 12/19/21 1447 12/19/21 2106 12/20/21 0506  BP: 101/67 124/72 117/72 115/68  Pulse: 95 92 84 91  Resp: '20 18 17 19  '$ Temp: 97.9 F (36.6 C) 98.5 F (36.9 C) 98 F (36.7 C) 97.8 F (36.6  C)  TempSrc: Oral Oral Oral Oral  SpO2: 96% 98% 100% 94%  Weight:      Height:        Intake/Output Summary (Last 24 hours) at 12/20/2021 0833 Last data filed at 12/20/2021 0600 Gross per 24 hour  Intake 940 ml  Output 950 ml  Net -10 ml       12/19/2021    5:52 AM 12/17/2021    5:00 AM 12/16/2021    4:24 AM  Last 3 Weights  Weight (lbs) 312 lb 9.8 oz 306 lb 3.5 oz 308 lb 3.3 oz  Weight (kg) 141.8 kg 138.9 kg 139.8 kg    Body mass index is 46.16 kg/m.  General: Well nourished, well developed, in no acute distress Head: Atraumatic, normal size  Eyes: PEERLA, EOMI  Neck: Supple, no JVD Endocrine: No thryomegaly Cardiac: Normal S1, S2; irregular rhythm Lungs: Breath sounds bilaterally Abd: Soft, nontender, no hepatomegaly  Ext: trace LE edema Musculoskeletal: No deformities, BUE and BLE strength normal and equal Skin: Warm and dry, no rashes   Neuro: Alert and oriented to person, place, time, and situation, CNII-XII grossly intact, no  focal deficits  Psych: Normal mood and affect   Labs  High Sensitivity Troponin:  No results for input(s): "TROPONINIHS" in the last 720 hours.   Cardiac EnzymesNo results for input(s): "TROPONINI" in the last 168 hours. No results for input(s): "TROPIPOC" in the last 168 hours.  Chemistry Recent Labs  Lab 12/13/21 1433 12/14/21 1323 12/15/21 0357 12/16/21 0423 12/18/21 0406 12/19/21 0432 12/20/21 0452  NA 137 137 139   < > 139 140 137  K 2.9* 3.5 3.9   < > 3.2* 3.6 3.2*  CL 97* 98 101   < > 98 98 97*  CO2 '30 30 28   '$ < > '30 31 30  '$ GLUCOSE 52* 173* 177*   < > 196* 177* 248*  BUN 66* 66* 66*   < > 68* 69* 71*  CREATININE 2.35* 2.34* 2.13*   < > 2.27* 2.22* 2.30*  CALCIUM 9.6 9.5 9.4   < > 9.7 9.8 9.6  PROT 7.1 6.8 6.5  --   --   --   --   ALBUMIN 3.8 3.5 3.5  --   --   --   --   AST '20 16 18  '$ --   --   --   --   ALT '9 9 9  '$ --   --   --   --   ALKPHOS 91 86 81  --   --   --   --   BILITOT 1.6* 1.5* 1.3*  --   --   --   --   GFRNONAA  21* 21* 24*   < > 22* 23* 22*  ANIONGAP '10 9 10   '$ < > '11 11 10   '$ < > = values in this interval not displayed.    Hematology Recent Labs  Lab 12/18/21 0406 12/19/21 0432 12/20/21 0452  WBC 10.4 9.3 9.5  RBC 3.57* 3.63* 3.44*  HGB 9.8* 9.9* 9.3*  HCT 32.5* 33.3* 31.2*  MCV 91.0 91.7 90.7  MCH 27.5 27.3 27.0  MCHC 30.2 29.7* 29.8*  RDW 18.2* 18.4* 18.3*  PLT 160 167 164   BNPNo results for input(s): "BNP", "PROBNP" in the last 168 hours.  DDimer No results for input(s): "DDIMER" in the last 168 hours.   Radiology  No results found.  Cardiac Studies  TTE 12/14/2021  1. D shaped septum with severe cor pulmonale known . Left ventricular  ejection fraction, by estimation, is 45 to 50%. The left ventricle has  mildly decreased function. The left ventricle demonstrates global  hypokinesis. There is mild left ventricular  hypertrophy. Left ventricular diastolic parameters are indeterminate.   2. Patient has known severe RV failure on TTE done 08/08/20. Right  ventricular systolic function is severely reduced. The right ventricular  size is severely enlarged. There is moderately elevated pulmonary artery  systolic pressure.   3. Left atrial size was moderately dilated.   4. A small pericardial effusion is present. The pericardial effusion is  lateral to the left ventricle and posterior to the left ventricle.   5. The mitral valve is abnormal. Trivial mitral valve regurgitation.   6. Tricuspid valve regurgitation is moderate.   Patient Profile  76 year old female with history of HFpEF, cor pulmonale with severe pulm hypertension, permanent atrial fibrillation, CKD stage IV, DVT, polyneuropathy, carpal tunnel, morbid obesity, OSA who was admitted on 12/13/2021 for fall.  Cardiology was consulted for acute volume overload.  Assessment & Plan   #Acute on chronic systolic heart failure #Cor  pulmonale #CKD stage IV #Anasarca -Creatinine up slightly to 2.30 (GFR stable) - suspect end  diuresis. Not  much output overnight.  Will transition to oral diuretics today - bumex 4 mg daily. -Hospice care would be appropriate but she is not wanting to do that. -Mainstay of treatment is palliative with diuresis.  #Persistent atrial fibrillation -Rate can trolled with metoprolol 12.5 mg twice daily.  Continue Eliquis.    For questions or updates, please contact [place] Please consult [email or web address] for contact info under   Pixie Casino, MD, FACC, [place] Director of the [facility] Diplomate of the American Board of Clinical Lipidology Attending Cardiologist  Direct Dial: [phone]  Fax: [phone]  Website:  [email or web address]  12/20/2021 8:33 AM

## 2021-12-20 NOTE — Progress Notes (Signed)
PROGRESS NOTE    Kristine [name]  BWI:[id number] DOB: Feb 28, 1946 DOA: 12/13/2021 PCP: Prince Solian, MD    Brief Narrative:  Kristine [name] is a 76 y.o. female with medical history significant of seasonal allergies, asthma, anxiety, depression, chronic atrial fibrillation on apixaban, history of amiodarone toxicity, osteoarthritis, chronic combined systolic and diastolic CHF, community-acquired pneumonia, cataracts, chronic bronchitis, stage IV CKD, dyslipidemia, GERD, hyperlipidemia, mild aortic sclerosis, class III obesity, diabetic peripheral neuropathy, type II DM, osteoporosis, hypothyroidism, sleep apnea, scoliosis, spinal stenosis, vitamin D deficiency, history of other nonhemorrhagic CVA with residual left-sided weakness requiring a walker to ambulate who slipped out of bed falling on her right knee and also injuring her right forearm with a laceration.  She has been having hematuria for the past 3 days. She has also noticed significant lower extremity and abdomen edema.   CT chest/abdomen/pelvis positive for enlargement of previously known pericardial effusion, cardiomegaly, diffuse anasarca, small volume splenic free fluid, right adnexal mass measuring 4.2 x 4.3 cm with recommendations for pelvic ultrasound and aortic atherosclerosis.   She was admitted to [facility] for falls and hematuria. Cardiology consulted for volume overload and pericardial effusion.  Difficult to diuresis.  Remains overall in poor condition.   Assessment & Plan:   Acute on chronic diastolic congestive heart failure with preserved ejection fraction/anasarca: Advanced right ventricular heart failure with severe pulmonary hypertension and cardiomyopathy. Challenging diuresing.  Cardiology following. Treated with high-dose of IV Lasix, intermittent metolazone.  Some improvement of symptoms however not much progress.  Followed by cardiology.  Changing to Bumex 4 mg daily today.  Will monitor next 24 hours  before sending home. Daily weight.  Mobility.  Renal functions monitoring. Followed by palliative care.  May benefit with hospice at home. Family has agreed to have outpatient palliative care follow-up. Patient will need DME, hospital bed and supplies at home along with home health PT OT.  Type 2 diabetes with diabetic neuropathy, hyperglycemia on admission: Hypoglycemia resolved.  A1c 6.3.  Currently remains on Lyrica 50 mg twice daily for diabetic neuropathy. Patient is on Tresiba 26 units at home, held for hypoglycemia.  Will resume on discharge. Remains on low-dose insulin.  Persistent A-fib: Heart rate controlled.  On metoprolol.  Back on Eliquis.  Hematuria due to UTI and also due to coagulopathy: Earlier on Rocephin.  Urine culture showing Enterococcus.  Continue amoxicillin for 7 days.  Abnormal CT scan: MRI done.  Patient has ovarian cyst and adnexal cyst.  Will need follow-up scans.  CKD stage IV: Creatinine at about baseline.  Very high risk of decompensation.  Right upper extremity/right axillary vein DVT: On Eliquis.  Hypokalemia: Replaced.  We will keep on a scheduled replacement when patient is on diuretics.  Debility and deconditioning: Work with PT OT.  Remains in poor clinical status.  Discussed with husband at bedside.  PT OT recommended skilled nursing facility placement.  She wants to go home. Will prescribe home health PT OT.  Patient also with significant mobility, requiring frequent repositioning and will need DME at home.  She will benefit with hospital bed at home.    DVT prophylaxis: SCDs Start: 12/14/21 1647 apixaban (ELIQUIS) tablet 5 mg   Code Status: Partial Family Communication: Husband at the bedside Disposition Plan: Status is: Inpatient Remains inpatient appropriate because: IV diuresis, significant symptoms and debility.     Consultants:  Cardiology Palliative care  Procedures:  None  Antimicrobials:   Ampicillin   Subjective: Patient seen and examined.  She  complains of more cough and white sputum production.  Chest x-ray was done that shows pulmonary edema is better today.  No new findings on x-ray.  Will use some Mucinex.  Remains afebrile.  Patient is not sure how she feels otherwise.  Husband at the bedside.  They are agreeable to go home tomorrow as per cardiology plan.  Objective: Vitals:   12/19/21 1403 12/19/21 1447 12/19/21 2106 12/20/21 0506  BP: 101/67 124/72 117/72 115/68  Pulse: 95 92 84 91  Resp: '20 18 17 19  '$ Temp: 97.9 F (36.6 C) 98.5 F (36.9 C) 98 F (36.7 C) 97.8 F (36.6 C)  TempSrc: Oral Oral Oral Oral  SpO2: 96% 98% 100% 94%  Weight:      Height:        Intake/Output Summary (Last 24 hours) at 12/20/2021 1207 Last data filed at 12/20/2021 1147 Gross per 24 hour  Intake 500 ml  Output 900 ml  Net -400 ml   Filed Weights   12/16/21 0424 12/17/21 0500 12/19/21 0552  Weight: (!) 139.8 kg (!) 138.9 kg (!) 141.8 kg    Examination:  General exam: Chronically sick looking.  Frail and debilitated.  On room air.  Intermittently coughing. Laceration right forearm covered with a skin.  Hematoma skin of the right knee. Respiratory system: Poor lateral air entry.  No added sounds.  Transmitted sounds. Cardiovascular system: S1 & S2 heard, RRR.  2+ bilateral pedal edema. Gastrointestinal system: Abdomen is nondistended, soft and nontender. No organomegaly or masses felt. Normal bowel sounds heard.  Obese and pendulous. Central nervous system: Alert and oriented. No focal neurological deficits.  Generalized weakness.    Data Reviewed: I have personally reviewed following labs and imaging studies  CBC: Recent Labs  Lab 12/13/21 1433 12/14/21 1323 12/15/21 0357 12/16/21 0423 12/17/21 0418 12/18/21 0406 12/19/21 0432 12/20/21 0452  WBC 11.5* 10.4   < > 9.6 10.3 10.4 9.3 9.5  NEUTROABS 8.9* 8.1*  --   --   --   --   --   --   HGB 11.0* 10.1*   < >  10.2* 9.8* 9.8* 9.9* 9.3*  HCT 35.9* 33.5*   < > 35.5* 32.0* 32.5* 33.3* 31.2*  MCV 89.1 91.5   < > 98.9 91.2 91.0 91.7 90.7  PLT 159 141*   < > 156 148* 160 167 164   < > = values in this interval not displayed.   Basic Metabolic Panel: Recent Labs  Lab 12/13/21 1433 12/14/21 1323 12/15/21 0357 12/15/21 0414 12/16/21 0423 12/17/21 0418 12/18/21 0406 12/19/21 0432 12/20/21 0452  NA 137 137   < >  --  139 139 139 140 137  K 2.9* 3.5   < >  --  4.4 3.2* 3.2* 3.6 3.2*  CL 97* 98   < >  --  101 98 98 98 97*  CO2 30 30   < >  --  '26 28 30 31 30  '$ GLUCOSE 52* 173*   < >  --  172* 201* 196* 177* 248*  BUN 66* 66*   < >  --  65* 68* 68* 69* 71*  CREATININE 2.35* 2.34*   < >  --  2.16* 2.19* 2.27* 2.22* 2.30*  CALCIUM 9.6 9.5   < >  --  9.5 9.6 9.7 9.8 9.6  MG 2.4 2.4  --  2.5* 2.6*  --   --   --   --   PHOS  --  4.1  --   --   --   --   --   --   --    < > =  values in this interval not displayed.   GFR: Estimated Creatinine Clearance: 32.7 mL/min (A) (by C-G formula based on SCr of 2.3 mg/dL (H)). Liver Function Tests: Recent Labs  Lab 12/13/21 1433 12/14/21 1323 12/15/21 0357  AST '20 16 18  '$ ALT '9 9 9  '$ ALKPHOS 91 86 81  BILITOT 1.6* 1.5* 1.3*  PROT 7.1 6.8 6.5  ALBUMIN 3.8 3.5 3.5   No results for input(s): "LIPASE", "AMYLASE" in the last 168 hours. No results for input(s): "AMMONIA" in the last 168 hours. Coagulation Profile: Recent Labs  Lab 12/15/21 0357  INR 1.4*   Cardiac Enzymes: No results for input(s): "CKTOTAL", "CKMB", "CKMBINDEX", "TROPONINI" in the last 168 hours. BNP (last 3 results) No results for input(s): "PROBNP" in the last 8760 hours. HbA1C: No results for input(s): "HGBA1C" in the last 72 hours.  CBG: Recent Labs  Lab 12/19/21 1148 12/19/21 1710 12/19/21 2100 12/20/21 0805 12/20/21 1134  GLUCAP 232* 260* 216* 235* 249*   Lipid Profile: No results for input(s): "CHOL", "HDL", "LDLCALC", "TRIG", "CHOLHDL", "LDLDIRECT" in the last 72  hours. Thyroid Function Tests: No results for input(s): "TSH", "T4TOTAL", "FREET4", "T3FREE", "THYROIDAB" in the last 72 hours. Anemia Panel: No results for input(s): "VITAMINB12", "FOLATE", "FERRITIN", "TIBC", "IRON", "RETICCTPCT" in the last 72 hours. Sepsis Labs: No results for input(s): "PROCALCITON", "LATICACIDVEN" in the last 168 hours.  Recent Results (from the past 240 hour(s))  Urine Culture     Status: Abnormal   Collection Time: 12/15/21  2:43 PM   Specimen: Urine, Clean Catch  Result Value Ref Range Status   Specimen Description   Final    URINE, CLEAN CATCH Performed at [facility], [place] [address] [place], [place] [address]    Special Requests   Final    NONE Performed at [facility], [place] [address], [place], [place] [address]    Culture >=100,000 COLONIES/mL ENTEROCOCCUS FAECALIS (A)  Final   Report Status 12/17/2021 FINAL  Final   Organism ID, Bacteria ENTEROCOCCUS FAECALIS (A)  Final      Susceptibility   Enterococcus faecalis - MIC*    AMPICILLIN <=2 SENSITIVE Sensitive     NITROFURANTOIN <=16 SENSITIVE Sensitive     VANCOMYCIN 2 SENSITIVE Sensitive     * >=100,000 COLONIES/mL ENTEROCOCCUS FAECALIS         Radiology Studies: DG CHEST PORT 1 VIEW  Result Date: 12/20/2021 CLINICAL DATA:  Shortness of breath. EXAM: PORTABLE CHEST 1 VIEW COMPARISON:  Chest radiograph 10/29/2021 and earlier; CT chest abdomen pelvis 12/13/2021 FINDINGS: Of note, the patient is mildly rotated on this portable examination. Stable enlarged cardiomediastinal silhouette. Mild diffuse interstitial thickening, slightly improved compared to 10/29/2021. Multilevel degenerative changes of the thoracic spine. No acute osseous abnormality. IMPRESSION: 1. Mild diffuse interstitial thickening, slightly improved compared to 10/29/2021, favored to reflect mild pulmonary edema. 2. Stable cardiomegaly. Electronically Signed   By: Ileana Roup M.D.   On:  12/20/2021 11:40        Scheduled Meds:  allopurinol  300 mg Oral q AM   amoxicillin  500 mg Oral Q8H   apixaban  5 mg Oral BID   bumetanide  4 mg Oral Daily   insulin aspart  0-15 Units Subcutaneous TID WC   insulin glargine-yfgn  5 Units Subcutaneous Daily   levothyroxine  88 mcg Oral QAC breakfast   metoprolol tartrate  12.5 mg Oral BID   nystatin   Topical TID   mouth rinse  15 mL  Mouth Rinse 4 times per day   potassium chloride  40 mEq Oral BID   pregabalin  50 mg Oral BID   Continuous Infusions:  sodium chloride 10 mL/hr at 12/14/21 1700     LOS: 6 days    Time spent: 35 minutes    Barb Merino, MD [facility] Pager [phone]

## 2021-12-21 DIAGNOSIS — N390 Urinary tract infection, site not specified: Secondary | ICD-10-CM | POA: Diagnosis not present

## 2021-12-21 DIAGNOSIS — I272 Pulmonary hypertension, unspecified: Secondary | ICD-10-CM | POA: Diagnosis not present

## 2021-12-21 DIAGNOSIS — N184 Chronic kidney disease, stage 4 (severe): Secondary | ICD-10-CM | POA: Diagnosis not present

## 2021-12-21 DIAGNOSIS — I4819 Other persistent atrial fibrillation: Secondary | ICD-10-CM | POA: Diagnosis not present

## 2021-12-21 DIAGNOSIS — R601 Generalized edema: Secondary | ICD-10-CM | POA: Diagnosis not present

## 2021-12-21 DIAGNOSIS — I5042 Chronic combined systolic (congestive) and diastolic (congestive) heart failure: Secondary | ICD-10-CM | POA: Diagnosis not present

## 2021-12-21 DIAGNOSIS — W19XXXA Unspecified fall, initial encounter: Secondary | ICD-10-CM | POA: Diagnosis not present

## 2021-12-21 LAB — BASIC METABOLIC PANEL
Anion gap: 10 (ref 5–15)
BUN: 71 mg/dL — ABNORMAL HIGH (ref 8–23)
CO2: 29 mmol/L (ref 22–32)
Calcium: 9.9 mg/dL (ref 8.9–10.3)
Chloride: 99 mmol/L (ref 98–111)
Creatinine, Ser: 2.35 mg/dL — ABNORMAL HIGH (ref 0.44–1.00)
GFR, Estimated: 21 mL/min — ABNORMAL LOW (ref >60)
Glucose, Bld: 248 mg/dL — ABNORMAL HIGH (ref 70–99)
Potassium: 3.6 mmol/L (ref 3.5–5.1)
Sodium: 138 mmol/L (ref 135–145)

## 2021-12-21 LAB — GLUCOSE, CAPILLARY
Glucose-Capillary: 218 mg/dL — ABNORMAL HIGH (ref 70–99)
Glucose-Capillary: 198 mg/dL — ABNORMAL HIGH (ref 70–99)
Glucose-Capillary: 210 mg/dL — ABNORMAL HIGH (ref 70–99)

## 2021-12-21 MED ORDER — BUMETANIDE 2 MG PO TABS: 4.0000 mg | ORAL_TABLET | Freq: Every day | ORAL | 0 refills | Status: DC

## 2021-12-21 MED ORDER — BUMETANIDE 2 MG PO TABS: 4.0000 mg | ORAL_TABLET | Freq: Every day | ORAL | 0 refills | Status: AC

## 2021-12-21 MED ORDER — METOPROLOL TARTRATE 25 MG PO TABS
25.0000 mg | ORAL_TABLET | Freq: Two times a day (BID) | ORAL | Status: DC
  Administered 2021-12-21: 25 mg via ORAL
  Filled 2021-12-21: qty 1

## 2021-12-21 NOTE — TOC Transition Note (Addendum)
Transition of Care [facility]) - CM/SW Discharge Note   Patient Details  Name: Kristine [name] MRN: [id number] Date of Birth: March 28, 1945  Transition of [facility]) CM/SW Contact:  Roseanne Kaufman, RN Phone Number: 12/21/2021, 11:37 AM   Clinical Narrative:   Patient wants to resume [facility] services with [facility]. Notified Angie with [facility] of patient's preference for HHPT, [place], [place], [place] will notify patient within 24-48 hours to set up [facility] services. Spoke with patient's husband [name] who advised patient's current hospital bed does not satisfy patient's needs. Patient's husband reports he does not want to continue with [facility] and will work on returning current bed. Notified Jermaine with [facility] regarding patient's needs. Current bed needs to be removed and [facility] unable to get bed to patient today. [facility] will contact patient's husband on 12/22/21 to coordinate bed delivery. Patient's family is in agreement with this plan, MD, RN notified.  No additional TOC needs at this time.   - 2:00pm Notified by RN patient is very unstablewill need PTAR transportation, family unable to transport patient. PTAR called, awaiting MD signature for DNR.  Notified MD, RN, and patient's family.  TOC will continue to follow.   Final next level of care: [place] Barriers to Discharge: No Barriers Identified   Patient Goals and CMS Choice Patient states their goals for this hospitalization and ongoing recovery are:: return home with home health and DME services CMS Medicare.gov Compare Post Acute Care list provided to:: Patient Represenative (must comment) [name]) Choice offered to / list presented to : Spouse  Discharge Placement    Home with [facility], HHPT/OT, DME (hospital bed)                   Discharge Plan and Services                DME Arranged: Hospital bed DME Agency: [facility] Date DME Agency Contacted: 12/21/21 Time DME Agency Contacted:  [id number] Representative spoke with at DME Agency: [place] [facility] HH Arranged: PT, OT, RN [place] Agency: [place] Date [place]: 12/21/21 Time [place]: 1134 Representative spoke with at [place]: [place] ([place]) Interventions     Readmission Risk Interventions    11/10/2021   10:18 AM 11/10/2021   10:17 AM 09/12/2021   12:58 PM  Readmission Risk Prevention Plan  Transportation Screening  Complete Complete  PCP or Specialist Appt within 3-5 Days Complete  Complete  HRI or [place]  Complete   Social Work Consult for [place] Planning/Counseling  Complete Complete  Palliative Care Screening  Not Applicable Not Applicable  Medication Review Press photographer)  Complete Complete

## 2021-12-21 NOTE — Progress Notes (Signed)
Discharge instructions provided to and reviewed with patient, patient's husband, and patient's daughter.  All verbalized understanding.  Wound care to RUE performed.  PIVs and cardiac monitoring removed.  Patient's belongings taken home by patient's husband.  PTAR picked up patient for transport home.  Angie Fava, RN

## 2021-12-21 NOTE — TOC Initial Note (Signed)
Transition of Care [facility]) - Initial/Assessment Note    Patient Details  Name: Kristine [name] MRN: [id number] Date of Birth: 12/23/1945  Transition of Care [facility]) CM/SW Contact:    Kristine Kaufman, RN Phone Number: 12/21/2021, 11:35 AM  Clinical Narrative:    Kristine [name] consult for HH/DME and palliative services.  TOC will continue to follow                Barriers to Discharge: No Barriers Identified   Patient Goals and CMS Choice Patient states their goals for this hospitalization and ongoing recovery are:: return home with home health and DME services CMS Medicare.gov Compare Post Acute Care list provided to:: Patient Represenative (must comment) Kristine [name]) Choice offered to / list presented to : Spouse  Expected Discharge Plan and Services           Expected Discharge Date: 12/21/21               DME Arranged: Hospital bed DME Agency: [employer] Date DME Agency Contacted: 12/21/21 Time DME Agency Contacted: [id number] Representative spoke with at DME Agency: Kristine [name] HH Arranged: PT, OT, RN [place] Agency: [place] Date [place]: 12/21/21 Time [place]: 1134 Representative spoke with at [place]: [place] Arrangements/Services                       Activities of Daily Living Home Assistive Devices/Equipment: None ADL Screening (condition at time of admission) Patient's cognitive ability adequate to safely complete daily activities?: Yes Is the patient deaf or have difficulty hearing?: No Does the patient have difficulty seeing, even when wearing glasses/contacts?: No Does the patient have difficulty concentrating, remembering, or making decisions?: No Patient able to express need for assistance with ADLs?: Yes Does the patient have difficulty dressing or bathing?: Yes Independently performs ADLs?: No Communication: Independent Dressing (OT): Needs assistance Is this a change from baseline?:  Pre-admission baseline Grooming: Needs assistance Is this a change from baseline?: Pre-admission baseline Feeding: Dependent Is this a change from baseline?: Pre-admission baseline Bathing: Needs assistance Is this a change from baseline?: Pre-admission baseline Toileting: Needs assistance Is this a change from baseline?: Pre-admission baseline In/Out Bed: Needs assistance Is this a change from baseline?: Pre-admission baseline Walks in Home: Needs assistance Is this a change from baseline?: Pre-admission baseline Does the patient have difficulty walking or climbing stairs?: Yes Weakness of Legs: Both Weakness of Arms/Hands: Both  Permission Sought/Granted                  Emotional Assessment              Admission diagnosis:  Anasarca [R60.1] Pericardial effusion [I31.39] Hypoglycemia [E16.2] Fall [W19.XXXA] Adnexal mass [N94.89] Acute cystitis with hematuria [N30.01] Skin tear of left upper arm without complication, initial encounter [S41.112A] Hematuria, unspecified type [R31.9] Patient Active Problem List   Diagnosis Date Noted   Hypoglycemia 12/14/2021   Acute lower UTI 12/14/2021   Other intra-abdominal and pelvic swelling, mass and lump 12/14/2021   Hematuria 12/14/2021   Hypothyroidism 12/14/2021   Anasarca 12/14/2021   Fall 12/13/2021   Atrial fibrillation with rapid ventricular response ([place])    Bacteremia    Cellulitis    Pressure injury of skin 10/29/2021   Septic shock ([place]) 10/28/2021   Acute pulmonary edema ([place])    Acute right-sided heart failure (HCC)    CHF (congestive heart failure) ([place]) 09/11/2021   Hypokalemia 09/11/2021  Hematoma of left knee region 05/14/2021   Vaginal atrophy 03/20/2021   DM type 2 with diabetic peripheral neuropathy ([name]) 01/07/2021   Compression fracture of L2 ([place]) 01/11/2020   Low back pain 12/06/2019   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 (HCC) 08/31/2019   Moderate nonproliferative  diabetic retinopathy of right eye ([place]) 08/31/2019   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   Chronic combined systolic and diastolic CHF (congestive heart failure) ([place]) 06/29/2019   Gout 01/03/2019   Pulmonary hypertension ([place]) 01/03/2019   Essential hypertension 01/03/2019   Candidiasis, intertriginous 01/03/2019   Unspecified asthma, uncomplicated 54/36/0677   CKD (chronic kidney disease) stage 4, GFR 15-29 ml/min (HCC) 06/03/2018   Age-related osteoporosis without current pathological fracture 05/22/2018   Gastroesophageal reflux disease without esophagitis 05/22/2018   Cardiomyopathy ([place]) 02/11/2018   Charcot foot due to diabetes mellitus ([place]) 02/11/2018   Uncontrolled type 2 diabetes mellitus with hyperglycemia ([place]) 02/11/2018   Gait abnormality 02/09/2017   Obstructive sleep apnea 02/09/2017   Diabetic peripheral neuropathy ([place]) 09/02/2016   Peripheral neuropathy 07/29/2016   Spinal stenosis of lumbar region 07/29/2016   Excessive daytime sleepiness 07/29/2016   Obesity, Class III, BMI 40-49.9 (morbid obesity) ([place]) 07/29/2016   Normocytic anemia 05/31/2015   Acute on chronic combined systolic and diastolic CHF (congestive heart failure) ([place]) 05/31/2015   Paroxysmal atrial fibrillation ([place])    Obesity 03/40/3524   Diastolic dysfunction with chronic heart failure ([place]) 06/23/2013   Persistent atrial fibrillation ([place]) 06/20/2010   Pneumonia    Abnormal cardiovascular function study    High cholesterol    Scoliosis    Spinal stenosis    Anxiety    Osteoarthritis    Osteoporosis    Mild aortic sclerosis    Degenerative disc disease    Vitamin D deficiency    HEMOPTYSIS UNSPECIFIED 05/12/2010   Diabetes mellitus, type 2 ([place]) 11/19/2009   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 [place] [address] Phone: [phone] Fax: [phone]     Social Determinants of Health (SDOH) Interventions    Readmission Risk Interventions    11/10/2021   10:18 AM 11/10/2021   10:17 AM 09/12/2021   12:58 PM  Readmission Risk Prevention Plan  Transportation Screening  Complete Complete  PCP or Specialist Appt within 3-5 Days Complete  Complete  HRI or Home Care Consult  Complete   Social Work Consult for [place] Planning/Counseling  Complete Complete  Palliative Care Screening  Not Applicable Not Applicable  Medication Review [employer])  Complete Complete

## 2021-12-21 NOTE — Discharge Summary (Signed)
Physician Discharge Summary  Kristine [name] IOX:[id number] DOB: 01-Mar-1946 DOA: 12/13/2021  PCP: Prince Solian, MD  Admit date: 12/13/2021 Discharge date: 12/21/2021  Admitted From: Home Disposition: Home with home health PT OT and DME supplies  Recommendations for Outpatient Follow-up:  Follow up with PCP in 1-2 weeks Please obtain BMP/CBC in one week Cardiology to schedule follow-up  Home Health: PT/OT Equipment/Devices: Bedside commode, hospital bed  Discharge Condition: Fair CODE STATUS: DNR Diet recommendation: Low-salt and low-carb diet  Discharge summary: Patient has a history of asthma, anxiety, depression, chronic atrial fibrillation on apixaban, history of amiodarone toxicity, osteoarthritis, chronic combined systolic and diastolic CHF, stage IV CKD, dyslipidemia, GERD, hyperlipidemia, mild aortic sclerosis, class III obesity, diabetic peripheral neuropathy, type II DM, hypothyroidism, sleep apnea, scoliosis, spinal stenosis, vitamin D deficiency, history of other nonhemorrhagic CVA with residual left-sided weakness requiring a walker to ambulate who slipped out of bed falling on her right knee and also injuring her right forearm with a laceration.  She has been having hematuria for the past 3 days. She has also noticed significant lower extremity and abdomen edema and swelling.   CT chest/abdomen/pelvis positive for enlargement of previously known pericardial effusion, cardiomegaly, diffuse anasarca, small volume splenic free fluid, right adnexal mass measuring 4.2 x 4.3 cm with recommendations for pelvic ultrasound and aortic atherosclerosis.   She was admitted to [facility] for falls and hematuria. Cardiology consulted for volume overload and pericardial effusion.  Difficult to diuresis.  Remains overall in poor condition.  # Acute on chronic diastolic congestive heart failure with preserved ejection fraction/anasarca: Advanced right ventricular heart failure with severe  pulmonary hypertension and cardiomyopathy. Challenging diuresing.  Cardiology following. Treated with high-dose of IV Lasix, intermittent metolazone.  Some improvement of symptoms however not much progress.  Followed by cardiology.  Changing to Bumex 4 mg daily on discharge.   Followed by palliative care.  May benefit with hospice at home.  Family agreeable to palliative care follow-up at home. Patient will need DME, hospital bed and supplies at home along with home health PT OT.   # Type 2 diabetes with diabetic neuropathy, hyperglycemia on admission: Hypoglycemia resolved.  A1c 6.3.  Patient is on [place] and high-dose of sliding scale insulin that is managed by her endocrinologist.  She will go back on home doses, she has a monitor and records that her husband will show it to her endocrinologist and adjust insulin.  With her worsening renal functions and poor appetite, she may need less insulin now.    Persistent A-fib: Heart rate controlled.  On metoprolol.  Back on Eliquis.   Hematuria due to UTI and also due to coagulopathy: Earlier on Rocephin.  Urine culture showing Enterococcus.  Completed 7 days of antibiotics.   Abnormal CT scan: MRI done.  Patient has ovarian cyst and adnexal cyst.  Will need follow-up scans.   CKD stage IV: Creatinine at about baseline.  Very high risk of decompensation.   Right upper extremity/right axillary vein DVT: On Eliquis.   Hypokalemia: Replaced.  We will keep on a scheduled replacement when patient is on diuretics.   Remains in poor clinical status overall. Discussed with husband and multiple children and grandchildren at bedside.  PT OT recommended skilled nursing facility placement.  She wants to go home. Will prescribe home health PT OT.  Patient also with significant mobility, requiring frequent repositioning and will need DME at home.  She will benefit with hospital bed at home.  Very high risk of  decompensation and readmission.  We will  definitely benefit with home hospice.  She will follow-up with heart failure clinic.  Cardiology clinic may help obtaining CPAP or Trelegy because she has untreated sleep apnea.  Discharge Diagnoses:  Principal Problem:   Fall Active Problems:   Diabetes mellitus, type 2 ([place])   Diabetic peripheral neuropathy (HCC)   Hypokalemia   CKD (chronic kidney disease) stage 4, GFR 15-29 ml/min (HCC)   Essential hypertension   Pulmonary hypertension (HCC)   Persistent atrial fibrillation (HCC)   Normocytic anemia   Obesity, Class III, BMI 40-49.9 (morbid obesity) ([place])   Obstructive sleep apnea   Gout   Chronic combined systolic and diastolic CHF (congestive heart failure) (HCC)   Hypoglycemia   Acute lower UTI   Other intra-abdominal and pelvic swelling, mass and lump   Hematuria   Hypothyroidism   Anasarca    Discharge Instructions  Discharge Instructions     Diet - low sodium heart healthy   Complete by: As directed    Diet Carb Modified   Complete by: As directed    Discharge instructions   Complete by: As directed    Keep very close monitoring of blood sugars at home and talk to your diabetic doctor about the insulin regimen   Discharge wound care:   Complete by: As directed    Protect all pressure points with pads and dressing   Increase activity slowly   Complete by: As directed       Allergies as of 12/21/2021       Reactions   Epipen [epinephrine] Other (See Comments)   Tachycardia    Ventolin [albuterol] Other (See Comments)   Tachycardia A-Fib   Byetta 10 Mcg Pen [exenatide] Other (See Comments)   Unknown reaction   Celexa [citalopram Hydrobromide] Other (See Comments)   Prolonged QT prolongation   Demadex [torsemide] Swelling   Reclast [zoledronic Acid] Other (See Comments)   Pt was hospitalized due to medication   Tape Other (See Comments)   Skin tears easily   Toradol [ketorolac Tromethamine] Hives, Swelling   Swelling in hands   Zanaflex  [tizanidine] Other (See Comments)   Lethargy    Bee Venom Other (See Comments)   "makes me nervous"   Iodinated Contrast Media Other (See Comments)   Flushing    Keflex [cephalexin] Other (See Comments)   Makes her feel warm; she was told never to take it.     UPDATE 11/05/21: Tolerated Cefazolin 10/29/21 to 11/05/21, switched to oral cefadroxil 11/05/21.        Medication List     STOP taking these medications    diclofenac Sodium 1 % Gel Commonly known as: VOLTAREN   fluconazole 150 MG tablet Commonly known as: DIFLUCAN   furosemide 80 MG tablet Commonly known as: LASIX   metolazone 2.5 MG tablet Commonly known as: ZAROXOLYN       TAKE these medications    acetaminophen 500 MG tablet Commonly known as: TYLENOL Take 500 mg by mouth every 6 (six) hours as needed for moderate pain or headache.   allopurinol 300 MG tablet Commonly known as: ZYLOPRIM Take 300 mg by mouth daily.   ALPRAZolam 0.5 MG tablet Commonly known as: XANAX Take 0.5 mg by mouth every 6 (six) hours as needed for anxiety.   apixaban 5 MG Tabs tablet Commonly known as: ELIQUIS Take 1 tablet (5 mg total) by mouth 2 (two) times daily.   bumetanide 2 MG tablet Commonly known as:  BUMEX Take 2 tablets (4 mg total) by mouth daily.   docusate sodium 100 MG capsule Commonly known as: COLACE Take 100 mg by mouth daily as needed for mild constipation.   DULoxetine 30 MG capsule Commonly known as: CYMBALTA Take 30 mg by mouth in the morning.   estradiol 0.1 MG/GM vaginal cream Commonly known as: ESTRACE Place 1 Applicatorful vaginally as needed (vaginal irritation).   levothyroxine 88 MCG tablet Commonly known as: SYNTHROID Take 88 mcg by mouth every morning.   lidocaine-prilocaine cream Commonly known as: EMLA 1 gram qid prn What changed:  how much to take how to take this when to take this reasons to take this additional instructions   metoprolol tartrate 25 MG tablet Commonly known  as: LOPRESSOR Take 0.5 tablets (12.5 mg total) by mouth 2 (two) times daily.   NovoLOG FlexPen 100 UNIT/ML FlexPen Generic drug: insulin aspart Inject 40-60 Units into the skin See admin instructions. Inject 40-60 units 3 times daily with meals per sliding scale: CBG < 150 : 40 units CBG 151-199 : 45 units CBG 200-249 : 50 units CBG 250-299 : 55 units CBG 300-349 : 60 units   nystatin powder Commonly known as: MYCOSTATIN/NYSTOP Apply 1 Application topically 2 (two) times daily as needed (yeast infection).   ondansetron 4 MG tablet Commonly known as: Zofran Take 1 tablet (4 mg total) by mouth every 8 (eight) hours as needed for nausea or vomiting.   polyethylene glycol 17 g packet Commonly known as: MIRALAX / GLYCOLAX Take 17 g by mouth daily as needed for mild constipation.   potassium chloride SA 20 MEQ tablet Commonly known as: KLOR-CON M Take 2 tablets (40 mEq total) by mouth once a week. Take 2 tablets once weekly on Thursdays What changed:  when to take this additional instructions   pregabalin 50 MG capsule Commonly known as: LYRICA Take 50 mg by mouth 2 (two) times daily.   Regranex 0.01 % gel Generic drug: becaplermin Apply 1 Application topically daily as needed for wound care. Apply to left foot   SYSTANE OP Place 1 drop into both eyes 2 (two) times daily as needed (dry eyes).   traMADol 50 MG tablet Commonly known as: ULTRAM Take 50 mg by mouth 3 (three) times daily as needed (severe neuropathy pain).   Tyler Aas FlexTouch 200 UNIT/ML FlexTouch Pen Generic drug: insulin degludec Inject 26 Units into the skin daily. What changed:  how much to take when to take this   Vitamin D (Ergocalciferol) 1.25 MG (50000 UNIT) Caps capsule Commonly known as: DRISDOL Take 50,000 Units by mouth every Monday.               Durable Medical Equipment  (From admission, onward)           Start     Ordered   12/19/21 1126  For home use only DME Hospital bed   Once       Question Answer Comment  Length of Need Lifetime   Patient has (list medical condition): heart failure   The above medical condition requires: Patient requires the ability to reposition frequently   Head must be elevated greater than: 30 degrees   Bed type Semi-electric   Support Surface: Low Air loss Mattress      12/19/21 1125              Discharge Care Instructions  (From admission, onward)           Start  Ordered   12/21/21 0000  Discharge wound care:       Comments: Protect all pressure points with pads and dressing   12/21/21 1046            Follow-up Information     Ceasar Mons, MD Follow up in 1 month(s).   Specialty: Urology Why: Call to schedule appointment Contact information: [address] [place]  [address] [phone]         [facility] [place] Follow up.   Why: A representative from [place] will give you a call on Monday to coordinate DME: hospital bed with low air loss mattress. Contact information: [place] [address] [phone]         [place] Follow up.   Why: A representative from [place] will contact you within 24-48 hours from discharge to resume home health physical therapy, occupational and RN services.               Allergies  Allergen Reactions   Epipen [Epinephrine] Other (See Comments)    Tachycardia    Ventolin [Albuterol] Other (See Comments)    Tachycardia A-Fib   Byetta 10 Mcg Pen [Exenatide] Other (See Comments)    Unknown reaction   Celexa [Citalopram Hydrobromide] Other (See Comments)    Prolonged QT prolongation   Demadex [Torsemide] Swelling   Reclast [Zoledronic Acid] Other (See Comments)    Pt was hospitalized due to medication   Tape Other (See Comments)    Skin tears easily   Toradol [Ketorolac Tromethamine] Hives and Swelling    Swelling in hands   Zanaflex [Tizanidine] Other (See Comments)    Lethargy     Bee Venom Other (See Comments)    "makes me nervous"   Iodinated Contrast Media Other (See Comments)    Flushing     Keflex [Cephalexin] Other (See Comments)    Makes her feel warm; she was told never to take it.     UPDATE 11/05/21: Tolerated Cefazolin 10/29/21 to 11/05/21, switched to oral cefadroxil 11/05/21.     Consultations: Cardiology Palliative care   Procedures/Studies: DG CHEST PORT 1 VIEW  Result Date: 12/20/2021 CLINICAL DATA:  Shortness of breath. EXAM: PORTABLE CHEST 1 VIEW COMPARISON:  Chest radiograph 10/29/2021 and earlier; CT chest abdomen pelvis 12/13/2021 FINDINGS: Of note, the patient is mildly rotated on this portable examination. Stable enlarged cardiomediastinal silhouette. Mild diffuse interstitial thickening, slightly improved compared to 10/29/2021. Multilevel degenerative changes of the thoracic spine. No acute osseous abnormality. IMPRESSION: 1. Mild diffuse interstitial thickening, slightly improved compared to 10/29/2021, favored to reflect mild pulmonary edema. 2. Stable cardiomegaly. Electronically Signed   By: Ileana Roup M.D.   On: 12/20/2021 11:40   MR PELVIS WO CONTRAST  Result Date: 12/17/2021 CLINICAL DATA:  Right adnexal mass incidentally identified by CT EXAM: MRI PELVIS WITHOUT CONTRAST TECHNIQUE: Multiplanar multisequence MR imaging of the pelvis was performed. No intravenous contrast was administered. COMPARISON:  CT chest abdomen pelvis, 12/13/2021 FINDINGS: Urinary Tract:  No abnormality visualized. Bowel: Sigmoid diverticulosis. Otherwise unremarkable visualized pelvic bowel loops. Vascular/Lymphatic: No pathologically enlarged lymph nodes. No significant vascular abnormality seen. Reproductive: Status post hysterectomy. Fluid signal, cystic appearing lesion of the right ovary measuring 3.9 x 3.6 cm (series 5, image 13). Fluid signal cystic lesion of the left ovary measuring 1.8 x 1.0 cm (series 5, image 8). No obvious solid component on noncontrast  examination. Other:  Severe anasarca. Musculoskeletal: No suspicious bone lesions identified.  IMPRESSION: 1. Fluid signal, cystic appearing lesion of the right ovary measuring 3.9 x 3.6 cm. No obvious solid component on noncontrast examination. 2. Fluid signal cystic appearing lesion of the left ovary measuring 1.8 x 1.0 cm. No obvious solid component on noncontrast examination. 3. Although most likely benign and incidental, these lesions are incompletely characterized by noncontrast MR, and given nonvisualization at prior ultrasound, contrast enhanced MR surveillance should be considered in 6-12 months to ensure stability. 4. Severe anasarca. 5. Sigmoid diverticulosis. Electronically Signed   By: Delanna Ahmadi M.D.   On: 12/17/2021 08:10   US PELVIS (TRANSABDOMINAL ONLY)  Result Date: 12/15/2021 CLINICAL DATA:  Adnexal mass EXAM: TRANSABDOMINAL ULTRASOUND OF PELVIS TECHNIQUE: Transabdominal ultrasound examination of the pelvis was performed including evaluation of the uterus, ovaries, adnexal regions, and pelvic cul-de-sac. COMPARISON:  CT chest abdomen and pelvis 12/13/2021. FINDINGS: Uterus Surgically absent. Right ovary Measurements: Not visulaized.  No adnexal mass. Left ovary Measurements: Not visulaized. No adnexal mass. Other findings:  No abnormal free fluid. IMPRESSION: 1. Right adnexal mass seen on CT is not visualized on this exam. Consider MRI for further evaluation. Electronically Signed   By: Ronney Asters M.D.   On: 12/15/2021 15:44   ECHOCARDIOGRAM LIMITED  Result Date: 12/14/2021    ECHOCARDIOGRAM LIMITED REPORT   Patient Name:   Kristine [name] Date of Exam: 12/14/2021 Medical Rec #:  [id number]            Height:       69.0 in Accession #:    [id number]           Weight:       300.0 lb Date of Birth:  09-21-1945           BSA:          2.455 m Patient Age:    76 years             BP:           117/81 mmHg Patient Gender: F                    HR:           85 bpm. Exam Location:   Inpatient Procedure: Limited Echo, Color Doppler and Cardiac Doppler Indications:    pericardial effusion and CHF  History:        Patient has prior history of Echocardiogram examinations, most                 recent 08/08/2020. CHF; Risk Factors:Diabetes and Hypertension.  Sonographer:    Harvie Junior Referring Phys: [id number] DAVID MANUEL ORTIZ  Sonographer Comments: Technically difficult study due to poor echo windows, suboptimal apical window, no subcostal window, suboptimal parasternal window and patient is obese. Image acquisition challenging due to patient body habitus and supine, pain and bleeding under breast. IMPRESSIONS  1. D shaped septum with severe cor pulmonale known . Left ventricular ejection fraction, by estimation, is 45 to 50%. The left ventricle has mildly decreased function. The left ventricle demonstrates global hypokinesis. There is mild left ventricular hypertrophy. Left ventricular diastolic parameters are indeterminate.  2. Patient has known severe RV failure on TTE done 08/08/20. Right ventricular systolic function is severely reduced. The right ventricular size is severely enlarged. There is moderately elevated pulmonary artery systolic pressure.  3. Left atrial size was moderately dilated.  4. A small pericardial effusion is present. The pericardial effusion is lateral to the left ventricle and posterior  to the left ventricle.  5. The mitral valve is abnormal. Trivial mitral valve regurgitation.  6. Tricuspid valve regurgitation is moderate. FINDINGS  Left Ventricle: D shaped septum with severe cor pulmonale known. Left ventricular ejection fraction, by estimation, is 45 to 50%. The left ventricle has mildly decreased function. The left ventricle demonstrates global hypokinesis. There is mild left ventricular hypertrophy. Left ventricular diastolic parameters are indeterminate. Right Ventricle: Patient has known severe RV failure on TTE done 08/08/20. The right ventricular size is severely  enlarged. Right vetricular wall thickness was not assessed. Right ventricular systolic function is severely reduced. There is moderately elevated pulmonary artery systolic pressure. The tricuspid regurgitant velocity is 3.51 m/s, and with an assumed right atrial pressure of 8 mmHg, the estimated right ventricular systolic pressure is 82.9 mmHg. Left Atrium: Left atrial size was moderately dilated. Pericardium: A small pericardial effusion is present. The pericardial effusion is lateral to the left ventricle and posterior to the left ventricle. Mitral Valve: The mitral valve is abnormal. There is mild thickening of the mitral valve leaflet(s). There is mild calcification of the mitral valve leaflet(s). Mild mitral annular calcification. Trivial mitral valve regurgitation. Tricuspid Valve: Tricuspid valve regurgitation is moderate. Pulmonic Valve: Pulmonic valve regurgitation is mild. IAS/Shunts: The interatrial septum was not well visualized. LEFT VENTRICLE PLAX 2D LVIDd:         5.80 cm   Diastology LVIDs:         4.90 cm   LV e' medial:    7.62 cm/s LV PW:         1.30 cm   LV E/e' medial:  16.0 LV IVS:        1.30 cm   LV e' lateral:   5.98 cm/s LVOT diam:     1.90 cm   LV E/e' lateral: 20.3 LVOT Area:     2.84 cm  RIGHT VENTRICLE RV Basal diam:  5.40 cm RV Mid diam:    5.70 cm RV S prime:     8.27 cm/s LEFT ATRIUM           Index        RIGHT ATRIUM           Index LA diam:      4.80 cm 1.96 cm/m   RA Area:     21.90 cm LA Vol (A4C): 42.2 ml 17.19 ml/m  RA Volume:   70.70 ml  28.80 ml/m                        PULMONIC VALVE AORTA                 PV Vmax:          0.65 m/s Ao Root diam: 3.50 cm PV Peak grad:     1.7 mmHg Ao Asc diam:  3.50 cm PR End Diast Vel: 3.91 msec  MITRAL VALVE                TRICUSPID VALVE MV Area (PHT): 3.96 cm     TR Peak grad:   49.3 mmHg MV Decel Time: 192 msec     TR Vmax:        351.00 cm/s MR Peak grad: 16.2 mmHg MR Vmax:      201.00 cm/s   SHUNTS MV E velocity: 121.67 cm/s   Systemic Diam: 1.90 cm MV A velocity: 57.50 cm/s MV E/A ratio:  2.12 Jenkins Rouge MD Electronically signed by  Jenkins Rouge MD Signature Date/Time: 12/14/2021/4:10:37 PM    Final    CT CHEST ABDOMEN PELVIS WO CONTRAST  Result Date: 12/13/2021 CLINICAL DATA:  Blunt poly trauma with hematuria. EXAM: CT CHEST, ABDOMEN AND PELVIS WITHOUT CONTRAST TECHNIQUE: Multidetector CT imaging of the chest, abdomen and pelvis was performed following the standard protocol without IV contrast. RADIATION DOSE REDUCTION: This exam was performed according to the departmental dose-optimization program which includes automated exposure control, adjustment of the mA and/or kV according to patient size and/or use of iterative reconstruction technique. COMPARISON:  October 28, 2021 FINDINGS: CT CHEST FINDINGS Cardiovascular: Enlarged heart. Moderate in size pericardial effusion measuring up to 1.6 cm in the base of the heart. Calcific atherosclerotic disease of the aorta and coronary arteries. Mediastinum/Nodes: Borderline enlarged mediastinal lymph nodes. Example right paratracheal lymph node measures 1.4 cm in short axis. Partially calcified mildly enlarged lymph nodes in the bilateral hilar regions. Lungs/Pleura: Lungs are clear. No pleural effusion or pneumothorax. Scattered lung granulomas. Musculoskeletal: No chest wall mass or suspicious bone lesions identified. CT ABDOMEN PELVIS FINDINGS Hepatobiliary: No hepatic injury or perihepatic hematoma. Gallbladder is unremarkable Pancreas: Unremarkable. No pancreatic ductal dilatation or surrounding inflammatory changes. Spleen: Normal in size without focal abnormality. Small volume water density perisplenic ascites. Adrenals/Urinary Tract: Adrenal glands are unremarkable. Kidneys are normal, without renal calculi, focal lesion, or hydronephrosis. Bladder is unremarkable. Stomach/Bowel: Stomach is within normal limits. No evidence of appendicitis. No evidence of bowel wall thickening,  distention, or inflammatory changes. Vascular/Lymphatic: Aortic atherosclerosis. No enlarged abdominal or pelvic lymph nodes. Reproductive: Post hysterectomy. The right adnexa is enlarged measuring 4.2 x 4.3 cm Other: Diffuse anasarca. Musculoskeletal: Spondylosis of the lumbosacral spine. IMPRESSION: 1. Small volume water density perisplenic free fluid. Given the presence of pericardial effusion and anasarca this may represent reactive effusion, however in the settings of trauma, and given lack of IV contrast, splenic injury cannot be entirely excluded. 2. Enlarged heart with moderate in size pericardial effusion measuring up to 1.6 cm in the base of the heart. While pericardial effusion was demonstrated demonstrated on patient's prior CT exams, it has enlarged on today's exam. 3. Diffuse anasarca. 4. Right adnexal mass measuring 4.2 x 4.3 cm. Further evaluation with nonemergent pelvic ultrasound may be considered. Aortic Atherosclerosis (ICD10-I70.0). Electronically Signed   By: Fidela Salisbury M.D.   On: 12/13/2021 16:54   DG Knee Complete 4 Views Right  Result Date: 12/13/2021 CLINICAL DATA:  Slid out of bed at 3 a.m. Bruising to the right knee. EXAM: RIGHT KNEE - COMPLETE 4+ VIEW COMPARISON:  None Available. FINDINGS: Right knee is located. Joint spaces are preserved. No acute osseous abnormality is present. No significant effusion is present. Prepatellar soft tissue swelling is present. IMPRESSION: Prepatellar soft tissue swelling. No acute osseous abnormality. Electronically Signed   By: San Morelle M.D.   On: 12/13/2021 14:52   (Echo, Carotid, EGD, Colonoscopy, ERCP)    Subjective: Patient was seen and examined.  Multiple family members at the bedside.  Patient tells me she is ready to go home.  She tells me she feels okay and will be more comfortable at home.  Husband at the bedside tells me he can manage and help her around.   Discharge Exam: Vitals:   12/21/21 0428 12/21/21 0815   BP: 116/66 131/72  Pulse: 71 97  Resp: 20   Temp: 98 F (36.7 C)   SpO2: 90% 93%   Vitals:   12/20/21 2102 12/21/21 0428 12/21/21 0500 12/21/21 0815  BP: (!) 129/91 116/66  131/72  Pulse: 83 71  97  Resp: (!) 22 20    Temp: 97.9 F (36.6 C) 98 F (36.7 C)    TempSrc: Oral Oral    SpO2: 100% 90%  93%  Weight:   (!) 138.3 kg   Height:        General: Pt is alert, awake, not in acute distress Morbidly obese.  Chronically sick looking.  Not in any distress.  Very frail and debilitated.  Multiple ecchymotic lesions. Hematoma on the right lateral arm.  Hematoma on the right frontal knee.  Skin lacerations on the right lateral arm. Cardiovascular: RRR, S1/S2 +, no rubs, no gallops Respiratory: CTA bilaterally, no wheezing, no rhonchi, difficult to auscultate. Abdominal: Soft, NT, ND, bowel sounds +, obese and pendulous.  1+ pedal edema. Extremities: 2+ bilateral pedal edema.  Multiple pressure ulcers.    The results of significant diagnostics from this hospitalization (including imaging, microbiology, ancillary and laboratory) are listed below for reference.     Microbiology: Recent Results (from the past 240 hour(s))  Urine Culture     Status: Abnormal   Collection Time: 12/15/21  2:43 PM   Specimen: Urine, Clean Catch  Result Value Ref Range Status   Specimen Description   Final    URINE, CLEAN CATCH Performed at [facility], [place] [address] [place], [place] [address]    Special Requests   Final    NONE Performed at [facility], [place] [address] [place], [place] [address]    Culture >=100,000 COLONIES/mL ENTEROCOCCUS FAECALIS (A)  Final   Report Status 12/17/2021 FINAL  Final   Organism ID, Bacteria ENTEROCOCCUS FAECALIS (A)  Final      Susceptibility   Enterococcus faecalis - MIC*    AMPICILLIN <=2 SENSITIVE Sensitive     NITROFURANTOIN <=16 SENSITIVE Sensitive     VANCOMYCIN 2 SENSITIVE Sensitive     * >=100,000 COLONIES/mL  ENTEROCOCCUS FAECALIS     Labs: BNP (last 3 results) Recent Labs    09/11/21 1730 10/28/21 1945 11/27/21 1452  BNP 368.7* 276.7* 166.0*   Basic Metabolic Panel: Recent Labs  Lab 12/15/21 0414 12/16/21 0423 12/17/21 0418 12/18/21 0406 12/19/21 0432 12/20/21 0452 12/21/21 0951  NA  --  139 139 139 140 137 138  K  --  4.4 3.2* 3.2* 3.6 3.2* 3.6  CL  --  101 98 98 98 97* 99  CO2  --  '26 28 30 31 30 29  '$ GLUCOSE  --  172* 201* 196* 177* 248* 248*  BUN  --  65* 68* 68* 69* 71* 71*  CREATININE  --  2.16* 2.19* 2.27* 2.22* 2.30* 2.35*  CALCIUM  --  9.5 9.6 9.7 9.8 9.6 9.9  MG 2.5* 2.6*  --   --   --   --   --    Liver Function Tests: Recent Labs  Lab 12/15/21 0357  AST 18  ALT 9  ALKPHOS 81  BILITOT 1.3*  PROT 6.5  ALBUMIN 3.5   No results for input(s): "LIPASE", "AMYLASE" in the last 168 hours. No results for input(s): "AMMONIA" in the last 168 hours. CBC: Recent Labs  Lab 12/16/21 0423 12/17/21 0418 12/18/21 0406 12/19/21 0432 12/20/21 0452  WBC 9.6 10.3 10.4 9.3 9.5  HGB 10.2* 9.8* 9.8* 9.9* 9.3*  HCT 35.5* 32.0* 32.5* 33.3* 31.2*  MCV 98.9 91.2 91.0 91.7 90.7  PLT 156 148* 160 167 164   Cardiac Enzymes:  No results for input(s): "CKTOTAL", "CKMB", "CKMBINDEX", "TROPONINI" in the last 168 hours. BNP: Invalid input(s): "POCBNP" CBG: Recent Labs  Lab 12/20/21 1134 12/20/21 1755 12/20/21 2127 12/21/21 0745 12/21/21 1141  GLUCAP 249* 217* 243* 218* 198*   D-Dimer No results for input(s): "DDIMER" in the last 72 hours. Hgb A1c No results for input(s): "HGBA1C" in the last 72 hours. Lipid Profile No results for input(s): "CHOL", "HDL", "LDLCALC", "TRIG", "CHOLHDL", "LDLDIRECT" in the last 72 hours. Thyroid function studies No results for input(s): "TSH", "T4TOTAL", "T3FREE", "THYROIDAB" in the last 72 hours.  Invalid input(s): "FREET3" Anemia work up No results for input(s): "VITAMINB12", "FOLATE", "FERRITIN", "TIBC", "IRON", "RETICCTPCT" in the  last 72 hours. Urinalysis    Component Value Date/Time   COLORURINE RED (A) 12/15/2021 1443   APPEARANCEUR CLOUDY (A) 12/15/2021 1443   LABSPEC 1.011 12/15/2021 1443   PHURINE 6.0 12/15/2021 1443   GLUCOSEU NEGATIVE 12/15/2021 1443   HGBUR MODERATE (A) 12/15/2021 1443   BILIRUBINUR NEGATIVE 12/15/2021 1443   KETONESUR NEGATIVE 12/15/2021 1443   PROTEINUR 100 (A) 12/15/2021 1443   UROBILINOGEN 1.0 11/10/2013 2324   NITRITE NEGATIVE 12/15/2021 1443   LEUKOCYTESUR SMALL (A) 12/15/2021 1443   Sepsis Labs Recent Labs  Lab 12/17/21 0418 12/18/21 0406 12/19/21 0432 12/20/21 0452  WBC 10.3 10.4 9.3 9.5   Microbiology Recent Results (from the past 240 hour(s))  Urine Culture     Status: Abnormal   Collection Time: 12/15/21  2:43 PM   Specimen: Urine, Clean Catch  Result Value Ref Range Status   Specimen Description   Final    URINE, CLEAN CATCH Performed at [facility], [place] [address], [place] [employer], [place] [address]    Special Requests   Final    NONE Performed at [facility], [place] [address] [place], [place] [address]    Culture >=100,000 COLONIES/mL ENTEROCOCCUS FAECALIS (A)  Final   Report Status 12/17/2021 FINAL  Final   Organism ID, Bacteria ENTEROCOCCUS FAECALIS (A)  Final      Susceptibility   Enterococcus faecalis - MIC*    AMPICILLIN <=2 SENSITIVE Sensitive     NITROFURANTOIN <=16 SENSITIVE Sensitive     VANCOMYCIN 2 SENSITIVE Sensitive     * >=100,000 COLONIES/mL ENTEROCOCCUS FAECALIS     Time coordinating discharge: 40 minutes  SIGNED:   Barb Merino, MD  [facility] 12/21/2021, 2:00 PM

## 2021-12-21 NOTE — Progress Notes (Addendum)
Cardiology Progress Note   Patient ID: Kristine [name] MRN: [id number] DOB: October 23, 1945 Date of Encounter: 12/21/2021  Primary Cardiologist: Glori Bickers, MD  Subjective   Net negative 360 cc yesterday- switched to Bumex- labs not drawn today.   Inpatient Medications  Scheduled Meds:  allopurinol  300 mg Oral q AM   amoxicillin  500 mg Oral Q8H   apixaban  5 mg Oral BID   bumetanide  4 mg Oral Daily   insulin aspart  0-15 Units Subcutaneous TID WC   insulin glargine-yfgn  5 Units Subcutaneous Daily   levothyroxine  88 mcg Oral QAC breakfast   metoprolol tartrate  12.5 mg Oral BID   nystatin   Topical TID   mouth rinse  15 mL Mouth Rinse 4 times per day   potassium chloride  40 mEq Oral BID   pregabalin  50 mg Oral BID   Continuous Infusions:  sodium chloride 10 mL/hr at 12/14/21 1700   PRN Meds: sodium chloride, acetaminophen **OR** acetaminophen, ALPRAZolam, dextrose, docusate sodium, guaiFENesin, ondansetron **OR** ondansetron (ZOFRAN) IV, mouth rinse, traMADol   Vital Signs   Vitals:   12/20/21 2102 12/21/21 0428 12/21/21 0500 12/21/21 0815  BP: (!) 129/91 116/66  131/72  Pulse: 83 71  97  Resp: (!) 22 20    Temp: 97.9 F (36.6 C) 98 F (36.7 C)    TempSrc: Oral Oral    SpO2: 100% 90%  93%  Weight:   (!) 138.3 kg   Height:        Intake/Output Summary (Last 24 hours) at 12/21/2021 0844 Last data filed at 12/21/2021 0200 Gross per 24 hour  Intake 840 ml  Output 1200 ml  Net -360 ml      12/21/2021    5:00 AM 12/19/2021    5:52 AM 12/17/2021    5:00 AM  Last 3 Weights  Weight (lbs) 304 lb 14.3 oz 312 lb 9.8 oz 306 lb 3.5 oz  Weight (kg) 138.3 kg 141.8 kg 138.9 kg      Telemetry   Afib with RVR - personally reviewed  Physical Exam   Vitals:   12/20/21 2102 12/21/21 0428 12/21/21 0500 12/21/21 0815  BP: (!) 129/91 116/66  131/72  Pulse: 83 71  97  Resp: (!) 22 20    Temp: 97.9 F (36.6 C) 98 F (36.7 C)    TempSrc: Oral Oral     SpO2: 100% 90%  93%  Weight:   (!) 138.3 kg   Height:        Intake/Output Summary (Last 24 hours) at 12/21/2021 0844 Last data filed at 12/21/2021 0200 Gross per 24 hour  Intake 840 ml  Output 1200 ml  Net -360 ml       12/21/2021    5:00 AM 12/19/2021    5:52 AM 12/17/2021    5:00 AM  Last 3 Weights  Weight (lbs) 304 lb 14.3 oz 312 lb 9.8 oz 306 lb 3.5 oz  Weight (kg) 138.3 kg 141.8 kg 138.9 kg    Body mass index is 45.03 kg/m.  General: Well nourished, well developed, in no acute distress Head: Atraumatic, normal size  Eyes: PEERLA, EOMI  Neck: Supple, no JVD Endocrine: No thryomegaly Cardiac: Normal S1, S2; irregular rhythm Lungs: Breath sounds bilaterally Abd: Soft, nontender, no hepatomegaly  Ext: trace LE edema Musculoskeletal: No deformities, BUE and BLE strength normal and equal Skin: Warm and dry, no rashes   Neuro: Alert and oriented to person,  place, time, and situation, CNII-XII grossly intact, no focal deficits  Psych: Normal mood and affect   Labs  High Sensitivity Troponin:  No results for input(s): "TROPONINIHS" in the last 720 hours.   Cardiac EnzymesNo results for input(s): "TROPONINI" in the last 168 hours. No results for input(s): "TROPIPOC" in the last 168 hours.  Chemistry Recent Labs  Lab 12/14/21 1323 12/15/21 0357 12/16/21 0423 12/18/21 0406 12/19/21 0432 12/20/21 0452  NA 137 139   < > 139 140 137  K 3.5 3.9   < > 3.2* 3.6 3.2*  CL 98 101   < > 98 98 97*  CO2 30 28   < > '30 31 30  '$ GLUCOSE 173* 177*   < > 196* 177* 248*  BUN 66* 66*   < > 68* 69* 71*  CREATININE 2.34* 2.13*   < > 2.27* 2.22* 2.30*  CALCIUM 9.5 9.4   < > 9.7 9.8 9.6  PROT 6.8 6.5  --   --   --   --   ALBUMIN 3.5 3.5  --   --   --   --   AST 16 18  --   --   --   --   ALT 9 9  --   --   --   --   ALKPHOS 86 81  --   --   --   --   BILITOT 1.5* 1.3*  --   --   --   --   GFRNONAA 21* 24*   < > 22* 23* 22*  ANIONGAP 9 10   < > '11 11 10   '$ < > = values in this interval  not displayed.    Hematology Recent Labs  Lab 12/18/21 0406 12/19/21 0432 12/20/21 0452  WBC 10.4 9.3 9.5  RBC 3.57* 3.63* 3.44*  HGB 9.8* 9.9* 9.3*  HCT 32.5* 33.3* 31.2*  MCV 91.0 91.7 90.7  MCH 27.5 27.3 27.0  MCHC 30.2 29.7* 29.8*  RDW 18.2* 18.4* 18.3*  PLT 160 167 164   BNPNo results for input(s): "BNP", "PROBNP" in the last 168 hours.  DDimer No results for input(s): "DDIMER" in the last 168 hours.   Radiology  DG CHEST PORT 1 VIEW  Result Date: 12/20/2021 CLINICAL DATA:  Shortness of breath. EXAM: PORTABLE CHEST 1 VIEW COMPARISON:  Chest radiograph 10/29/2021 and earlier; CT chest abdomen pelvis 12/13/2021 FINDINGS: Of note, the patient is mildly rotated on this portable examination. Stable enlarged cardiomediastinal silhouette. Mild diffuse interstitial thickening, slightly improved compared to 10/29/2021. Multilevel degenerative changes of the thoracic spine. No acute osseous abnormality. IMPRESSION: 1. Mild diffuse interstitial thickening, slightly improved compared to 10/29/2021, favored to reflect mild pulmonary edema. 2. Stable cardiomegaly. Electronically Signed   By: Ileana Roup M.D.   On: 12/20/2021 11:40    Cardiac Studies  TTE 12/14/2021  1. D shaped septum with severe cor pulmonale known . Left ventricular  ejection fraction, by estimation, is 45 to 50%. The left ventricle has  mildly decreased function. The left ventricle demonstrates global  hypokinesis. There is mild left ventricular  hypertrophy. Left ventricular diastolic parameters are indeterminate.   2. Patient has known severe RV failure on TTE done 08/08/20. Right  ventricular systolic function is severely reduced. The right ventricular  size is severely enlarged. There is moderately elevated pulmonary artery  systolic pressure.   3. Left atrial size was moderately dilated.   4. A small pericardial effusion is present. The pericardial effusion  is  lateral to the left ventricle and posterior to the  left ventricle.   5. The mitral valve is abnormal. Trivial mitral valve regurgitation.   6. Tricuspid valve regurgitation is moderate.   Patient Profile  76 year old female with history of HFpEF, cor pulmonale with severe pulm hypertension, permanent atrial fibrillation, CKD stage IV, DVT, polyneuropathy, carpal tunnel, morbid obesity, OSA who was admitted on 12/13/2021 for fall.  Cardiology was consulted for acute volume overload.  Assessment & Plan   #Acute on chronic systolic heart failure #Cor pulmonale #CKD stage IV #Anasarca -Creatinine up slightly to 2.30 (GFR stable) yesterday - switched to Bumex 4 mg daily -Hospice care would be appropriate, appreciate palliative recommendations. -Mainstay of treatment is palliative with diuresis.  #Persistent atrial fibrillation -Rate are a little fast on metoprolol 12.5 mg twice daily.  Will increase to 25 mg BID. -Anticoagulated on Eliquis 5 mg BID  Ok to d/c today from a cardiology standpoint - would recommend home palliative/hospice services - prognosis is realistically <6 months.  For questions or updates, please contact [place] Please consult [email or web address] for contact info under   Pixie Casino, MD, FACC, [place] Director of the [facility] Diplomate of the American Board of Clinical Lipidology Attending Cardiologist  Direct Dial: [phone]  Fax: [phone]  Website:  [email or web address]  12/21/2021 8:44 AM

## 2021-12-22 ENCOUNTER — Telehealth: Payer: Self-pay

## 2021-12-22 ENCOUNTER — Encounter (INDEPENDENT_AMBULATORY_CARE_PROVIDER_SITE_OTHER): Payer: Medicare HMO | Admitting: Ophthalmology

## 2021-12-22 NOTE — Telephone Encounter (Signed)
Received inbound call that patient has returned bed from [facility], however have not received new bed from [facility]. This RNCM sent message to [facility] with [facility] re: the family needing a bed as soon as possible due to patient no longer has a bed.   Patient was discharged yesterday with DME coordination in place.

## 2021-12-23 ENCOUNTER — Telehealth: Payer: Self-pay

## 2021-12-23 NOTE — Telephone Encounter (Signed)
Received inbound call from patient's daughter re: bed. Notified [facility] who will give the family a call back today.  No TOC needs at this time.

## 2021-12-24 ENCOUNTER — Telehealth: Payer: Self-pay

## 2021-12-24 NOTE — Telephone Encounter (Signed)
Received inbound call from patient's daughter Di Kindle re: outpatient palliative services. This RNCM sent referral to [facility] for outpt palliative services.  Notified Shanita with [facility], to contact patient and family.   Contact info: Daughter Denyce Robert [phone] or Mr. Girardot [phone]  No additional TOC needs at this time.

## 2021-12-25 ENCOUNTER — Telehealth: Payer: Self-pay

## 2021-12-25 ENCOUNTER — Encounter (HOSPITAL_COMMUNITY): Payer: Medicare HMO

## 2021-12-25 NOTE — Telephone Encounter (Signed)
Received inbound call from patient requesting status of hospital bed. Patient reports she returned her hospital bed from [facility] with the expectation that the hospital bed for [facility] would be at her home on Monday. PAtient also reports they have not received a call from [facility] for outpatient palliative services. This RNCM will follow up with both vendors.   Notified J. with [facility] to advise, patient does not have a bed and have not heard anything from [facility]. J. will research and give the family a call.    Spoke with Anderson Malta with [facility] for outpt palliative services, advised family is wanting a call to understand next steps.   No additional TOC needs at this time.

## 2021-12-25 NOTE — Telephone Encounter (Signed)
Spoke with patient's spouse Kristine [name] and daughter Kristine [name] and scheduled a Mychart Palliative Consult for 12/29/21 @ 2 PM.  Consent obtained; updated [employer], Team List and Epic.

## 2021-12-29 ENCOUNTER — Encounter: Payer: Self-pay | Admitting: Family Medicine

## 2021-12-29 ENCOUNTER — Telehealth: Payer: Self-pay | Admitting: Family Medicine

## 2021-12-29 ENCOUNTER — Encounter (INDEPENDENT_AMBULATORY_CARE_PROVIDER_SITE_OTHER): Payer: Medicare HMO | Admitting: Ophthalmology

## 2021-12-29 DIAGNOSIS — I5042 Chronic combined systolic (congestive) and diastolic (congestive) heart failure: Secondary | ICD-10-CM

## 2021-12-29 DIAGNOSIS — I2729 Other secondary pulmonary hypertension: Secondary | ICD-10-CM

## 2021-12-29 DIAGNOSIS — N184 Chronic kidney disease, stage 4 (severe): Secondary | ICD-10-CM

## 2021-12-29 DIAGNOSIS — Z515 Encounter for palliative care: Secondary | ICD-10-CM

## 2021-12-29 NOTE — Progress Notes (Unsigned)
[facility] Palliative Care Consult Note Telephone: [phone]  Fax: [phone]   Date of encounter: 12/29/21 2:11 PM PATIENT NAME: Kristine [name] [address] [place] [address]   [phone] (home)  DOB: 12-31-1945 MRN: [id number] PRIMARY CARE PROVIDER:    Prince Solian, MD,  [place] [address] [phone]  REFERRING PROVIDER:   Prince [name], [place],  [place] [address] [phone]  RESPONSIBLE PARTY:    Contact Information     Name Relation Home Work [place] Spouse [phone]   Kristine [name] Daughter   [phone]   [facility] Daughter   [phone]   Kristine [name]   [phone]   Kristine [name]   [phone]       I connected with  Kristine [name] on 12/29/21 by a video enabled telemedicine application and verified that I am speaking with the correct person using two identifiers.   I discussed the limitations of evaluation and management by telemedicine. The patient expressed understanding and agreed to proceed.  Palliative Care was asked to follow this patient by consultation request of  Kristine [name], Ravisankar, MD to address advance care planning and complex medical decision making. This is the initial visit.          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   Unable to tolerate further diuretic therapy, ACE-I/ARNI/SGLT2 inhibitor due to CKD  stage IV. Contact PCP for possible hospice referral.  Chronic combined systolic and diastolic heart failure As per above, maximized medical therapy NYHA class 4 Complicated by anasarca with reduction in EF from 55-60% 09/12/21 to 45-50% currently.  CKD stage IV Most recent eGFR 23 ml/min Fluid restricted due to heart failure, unable to tolerate  significant diuretic therapy. Likely with progression would not be able to tolerate dialysis with severe heart failure.  4.   Palliative Care Encounter Discussed ACP-pt wants DNR/DNI  Follow up Palliative Care Visit: Palliative care will contact PCP on pt's behalf to ask about possible Hospice referral    This visit was coded based on medical decision making (MDM).  PPS: 40%  HOSPICE ELIGIBILITY/DIAGNOSIS:  Right sided heart failure with pulmonary hypertension, anasarca, CKD stage IV, DM and pericardial effusion maximized on eligible treatments for heart failure.  Chief Complaint:  [name] received a referral to follow up with patient for chronic disease management, in setting of chronic combined systolic and diastolic CHF.  Palliative Care is also following to assist with advance directive planning and defining/refining goals of care.   HISTORY OF PRESENT ILLNESS:  Kristine [name] is a 76 y.o. year old female with severe  right sided heart failure,  chronic combined systolic and diastolic heart failure maximized on available therapies given constraints of CKD stage IV.  Pt noted to have DM with peripheral neuropathy, pericardial effusion, pulmonary hypertension, Paroxysmal atrial fibrillation, HTN,  cardiomyopathy, OSA, asthma, GERD, anasarca, Charcot foot, hypothyroidism, spinal stenosis, anasarca.  Pt has had significant decline since being hospitalized for septic shock in early August requiring vasopressor support followed by recent heart failure exacerbation. Pt's understanding of her condition is  "I don't have any heart left."  Spouse Kristine [name] and daughter Kristine [name] who is an ICU nurse in [place] were present for the entire video visit.  Pt reports intermittent brief, sharp chest pain today. Has orthopnea and intermittent PND. Has 32 ounce  limit liquids per day.  Eating 1-2 small meals per day. Has some nausea, no vomiting.  Fell 2-3 weeks ago on her right  knee. Has bedside commode and hospital bed. Nurse and PT comes twice a week through [facility].  EF 45-50%, started with right sided heart enlargement and moderate pulmonary hypertension of 57.3 mm HG.  Has chronically elevated BN, latest 432.2 on 11/27/21. Recent bleeding event.  Had sepsis and hospitalized for 14 days, home for 3 weeks, fell out of bed and lacerated her arm then had a repeat hospitalization for 7 days.  [place] and MRI done and no cancer in ovarian mass. Has to have help bathing and dressing.  Has had some intermittent bowel and bladder incontinence. Can ambulate from chair/bed to bathroom but that is all and has significant DOE.  Discussion of options including Hospice and pt/family asked if this would halt home care.  Advised that if approved for Hospice, home care would be d/c'd and that costs covered under traditional medicare when asked about coverage for benefit.  Pt and family would like pt to be DNR/DNI, discussed MOST but pt not ready to make other decisions at the moment. They are interested in pursuing Hospice for comfort care.    History obtained from review of EMR, discussion with family, and/or Kristine [name].   10/28/21 CT Chest ABD pelvis wo contrast: IMPRESSION: Cardiomegaly, coronary artery disease. Aortic atherosclerosis. No acute cardiopulmonary disease.  Changes of cirrhosis. Sigmoid diverticulosis. Anasarca like edema throughout the abdominal wall.  10/28/21 Blood cultures positive for Streptococcus agalactiae, 10/31/21 aspiration of left knee abscess with cultures showing no growth. Imaging diabetic right foot showing no evidence of osteomyelitis. Bacteremia source unknown per ID.  11/03/21 TEE with no vegetation but diagnosis of CHF with cardiac amyloidosis  11/10/21 PT assessment: Bed Mobility Overal bed mobility: Needs Assistance Bed Mobility: Supine to Sit Supine to sit: Mod assist General bed mobility comments: assist for trunk rise and completion of LE translation  to EOB.   Transfers Overall transfer level: Needs assistance Equipment used: Rolling walker (2 wheels) Transfers: Sit to/from Stand, Bed to chair/wheelchair/BSC Sit to Stand: Mod assist Step pivot transfers: Mod assist General transfer comment: assist for power up, hip extension, steadying upon standing. STS x2     Latest Ref Rng & Units 12/20/2021    4:52 AM 12/19/2021    4:32 AM 12/18/2021    4:06 AM  CBC  WBC 4.0 - 10.5 K/uL 9.5  9.3  10.4   Hemoglobin 12.0 - 15.0 g/dL 9.3  9.9  9.8   Hematocrit 36.0 - 46.0 % 31.2  33.3  32.5   Platelets 150 - 400 K/uL 164  167  160        Latest Ref Rng & Units 12/21/2021    9:51 AM 12/20/2021    4:52 AM 12/19/2021    4:32 AM  CMP  Glucose 70 - 99 mg/dL 248  248  177   BUN 8 - 23 mg/dL 71  71  69   Creatinine 0.44 - 1.00 mg/dL 2.35  2.30  2.22   Sodium 135 - 145 mmol/L 138  137  140   Potassium 3.5 - 5.1 mmol/L 3.6  3.2  3.6   Chloride 98 - 111 mmol/L 99  97  98   CO2 22 - 32 mmol/L 29  30  31    Calcium 8.9 - 10.3 mg/dL 9.9  9.6  9.8    10/29/21 Albumin 2.9, 3.5 as of 12/15/21  BNP (last 3 results) Recent Labs    09/11/21 1730 10/28/21 1945 11/27/21 1452  BNP 368.7* 276.7* 432.3*   12/13/21 CT Chest, ABD and pelvis wo contrast: IMPRESSION: 1. Small volume water density perisplenic free fluid. Given the presence of pericardial effusion and anasarca this may represent reactive effusion, however in the settings of trauma, and given lack of IV contrast, splenic injury cannot be entirely excluded. 2. Enlarged heart with moderate in size pericardial effusion measuring up to 1.6 cm in the base of the heart. While pericardial effusion was demonstrated demonstrated on patient's prior CT exams, it has enlarged on today's exam. 3. Diffuse anasarca. 4. Right adnexal mass measuring 4.2 x 4.3 cm. Further evaluation with nonemergent pelvic ultrasound may be considered.  12/14/21 2d echo: 1. D shaped septum with severe cor pulmonale known . Left  ventricular ejection fraction 45 to 50%. The left ventricle has mildly decreased function,  global hypokinesis and mild left ventricular hypertrophy. Left ventricular diastolic parameters are indeterminate.   2. Patient has known severe RV failure on TTE done 08/08/20. Right ventricular systolic function is severely reduced. The right ventricular size is severely enlarged. There is moderately elevated pulmonary artery systolic pressure with estimated right ventricular systolic pressure of 96.4 mmHg  3. Left atrial size was moderately dilated.   4. A small pericardial effusion is present. The pericardial effusion is lateral to the left ventricle and posterior to the left ventricle.   5. The mitral valve is abnormal. Trivial mitral valve regurgitation.   6. Tricuspid valve regurgitation is moderate.    12/15/21 HGB A1c:  6.3% on Insulin  12/17/21 MRI pelvis wo contrast: IMPRESSION: 1. Fluid signal, cystic appearing lesion of the right ovary measuring 3.9 x 3.6 cm. No obvious solid component on noncontrast examination. 2. Fluid signal cystic appearing lesion of the left ovary measuring 1.8 x 1.0 cm. No obvious solid component on noncontrast examination. 3. Although most likely benign and incidental, these lesions are incompletely characterized by noncontrast MR, and given nonvisualization at prior ultrasound, contrast enhanced MR surveillance should be considered in 6-12 months to ensure stability. 4. Severe anasarca. 5. Sigmoid diverticulosis.   12/19/21 PT notes: Bed Mobility Overal bed mobility: Needs Assistance Bed Mobility: Sit to Supine Sit to supine: Max assist, Total assist General bed mobility comments: required Max/Total Assist back to bed to support B LE and assist with upper body.   Transfers Overall transfer level: Needs assistance Equipment used: Rolling walker (2 wheels) Transfers: Sit to/from Stand, Bed to chair/wheelchair/BSC Sit to Stand: Max assist, +2 physical  assistance, +2 safety/equipment General transfer comment: pt required Max Asisst + 2 this afternoon from recliner then to and from [facility].   Ambulation/Gait General Gait Details: only was able to take a few pivot steps due to R knee pain and effort.  12/20/21 portable CXR: IMPRESSION: 1. Mild diffuse interstitial thickening, slightly improved compared to 10/29/2021, favored to reflect mild pulmonary edema. 2. Stable cardiomegaly.  I reviewed EMR for available labs, medications, imaging, studies and related documents.  Records reviewed and summarized above.   ROS General: NAD, endorses severe fatigue Cardiovascular: endorses fleeting chest pain, orthopnea, intermittent PND and severe DOE with poor activity tolerance Pulmonary: denies cough, denies increased SOB Abdomen: endorses fair appetite, denies constipation, endorses intermittent incontinence of bowel GU: denies dysuria, endorses intermittent incontinence of urine MSK:  endorses generalized weakness, single fall reported in Sept Skin: denies rashes  Neurological: endorses severe neuropathic pain in feet and intermittent insomnia Psych: Endorses  depressed mood Heme/lymph/immuno: denies bruises, resolved abnormal bleeding  Physical Exam: Current and past weights: 304 lbs 14.3 ounces as of 12/21/21, 09/26/21 weight was 278 lbs Constitutional: NAD, appears fatigued General: frail appearing, obese  CV: No visible cyanosis.  Can speak in complete sentences without having to stop to breathe.  Observed sitting up in chair Pulmonary:  no visible increased work of breathing, no cough, room air Abdomen: appears distended MSK: no sarcopenia, moves all extremities, ambulatory for 15 ft with rolling walker to bathroom only Neuro:  drowsy, arousable, no  noted cognitive impairment Psych: non-anxious affect, A and O x 3 Hem/lymph/immuno: no widespread bruising  CURRENT PROBLEM LIST:  Patient Active Problem List   Diagnosis Date Noted    Hypoglycemia 12/14/2021   Acute lower UTI 12/14/2021   Other intra-abdominal and pelvic swelling, mass and lump 12/14/2021   Hematuria 12/14/2021   Hypothyroidism 12/14/2021   Anasarca 12/14/2021   Fall 12/13/2021   Atrial fibrillation with rapid ventricular response ([place])    Bacteremia    Cellulitis    Pressure injury of skin 10/29/2021   Septic shock ([place]) 10/28/2021   Acute pulmonary edema ([place])    Acute right-sided heart failure (HCC)    CHF (congestive heart failure) ([place]) 09/11/2021   Hypokalemia 09/11/2021   Hematoma of left knee region 05/14/2021   Vaginal atrophy 03/20/2021   DM type 2 with diabetic peripheral neuropathy ([place]) 01/07/2021   Compression fracture of L2 ([place]) 01/11/2020   Low back pain 12/06/2019   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 ([place]) 08/31/2019   Moderate nonproliferative diabetic retinopathy of right eye ([place]) 08/31/2019   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   Chronic combined systolic and diastolic CHF (congestive heart failure) ([place]) 06/29/2019   Gout 01/03/2019   Pulmonary hypertension ([place]) 01/03/2019   Essential hypertension 01/03/2019   Candidiasis, intertriginous 01/03/2019   Unspecified asthma, uncomplicated 88/32/5498   CKD (chronic kidney disease) stage 4, GFR 15-29 ml/min (HCC) 06/03/2018   Age-related osteoporosis without current pathological fracture 05/22/2018   Gastroesophageal reflux disease without esophagitis 05/22/2018   Cardiomyopathy ([place]) 02/11/2018   Charcot foot due to diabetes mellitus ([place]) 02/11/2018   Uncontrolled type 2 diabetes mellitus with hyperglycemia ([place]) 02/11/2018   Gait abnormality 02/09/2017   Obstructive sleep apnea 02/09/2017   Diabetic peripheral neuropathy ([place]) 09/02/2016   Peripheral neuropathy 07/29/2016   Spinal stenosis of lumbar region 07/29/2016   Excessive daytime sleepiness 07/29/2016   Obesity, Class III, BMI 40-49.9 (morbid obesity) ([place]) 07/29/2016   Normocytic anemia  05/31/2015   Acute on chronic combined systolic and diastolic CHF (congestive heart failure) ([place]) 05/31/2015   Paroxysmal atrial fibrillation ([place])    Obesity 26/41/5830   Diastolic dysfunction with chronic heart failure ([place]) 06/23/2013   Persistent atrial fibrillation ([place]) 06/20/2010   Pneumonia    Abnormal cardiovascular function study    High cholesterol    Scoliosis    Spinal stenosis    Anxiety    Osteoarthritis    Osteoporosis    Mild aortic sclerosis    Degenerative disc disease    Vitamin D deficiency    HEMOPTYSIS UNSPECIFIED 05/12/2010   Diabetes mellitus, type 2 ([place]) 11/19/2009   Hypertensive cardiovascular disease 11/19/2009   Dyspnea 11/19/2009   Dog bite 12/15/2004   Open wound of foot excluding toes 12/15/2004   PAST MEDICAL HISTORY:  Active Ambulatory Problems    Diagnosis Date Noted   Diabetes mellitus, type 2 ([place]) 11/19/2009   Hypertensive cardiovascular disease 11/19/2009  Dyspnea 11/19/2009   HEMOPTYSIS UNSPECIFIED 05/12/2010   Pneumonia    Abnormal cardiovascular function study    High cholesterol    Scoliosis    Spinal stenosis    Anxiety    Osteoarthritis    Osteoporosis    Mild aortic sclerosis    Degenerative disc disease    Vitamin D deficiency    Persistent atrial fibrillation ([place]) 84/16/6063   Diastolic dysfunction with chronic heart failure ([place]) 06/23/2013   Obesity 10/15/2013   Paroxysmal atrial fibrillation (HCC)    Normocytic anemia 05/31/2015   Acute on chronic combined systolic and diastolic CHF (congestive heart failure) ([place]) 05/31/2015   Peripheral neuropathy 07/29/2016   Spinal stenosis of lumbar region 07/29/2016   Excessive daytime sleepiness 07/29/2016   Obesity, Class III, BMI 40-49.9 (morbid obesity) ([place]) 07/29/2016   Diabetic peripheral neuropathy ([place]) 09/02/2016   Gait abnormality 02/09/2017   Obstructive sleep apnea 02/09/2017   CKD (chronic kidney disease) stage 4, GFR 15-29 ml/min (HCC) 06/03/2018   Gout  01/03/2019   Pulmonary hypertension ([place]) 01/03/2019   Essential hypertension 01/03/2019   Candidiasis, intertriginous 01/03/2019   Chronic combined systolic and diastolic CHF (congestive heart failure) ([place]) 06/29/2019   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 ([place]) 08/31/2019   Moderate nonproliferative diabetic retinopathy of right eye ([place]) 08/31/2019   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   DM type 2 with diabetic peripheral neuropathy ([place]) 01/07/2021   Hematoma of left knee region 05/14/2021   CHF (congestive heart failure) ([place]) 09/11/2021   Hypokalemia 09/11/2021   Acute pulmonary edema ([place])    Acute right-sided heart failure ([place])    Septic shock ([place]) 10/28/2021   Pressure injury of skin 10/29/2021   Bacteremia    Cellulitis    Atrial fibrillation with rapid ventricular response ([place])    Fall 12/13/2021   Age-related osteoporosis without current pathological fracture 05/22/2018   Cardiomyopathy ([place]) 02/11/2018   Charcot foot due to diabetes mellitus ([place]) 02/11/2018   Compression fracture of L2 ([place]) 01/11/2020   Dog bite 12/15/2004   Gastroesophageal reflux disease without esophagitis 05/22/2018   Open wound of foot excluding toes 12/15/2004   Uncontrolled type 2 diabetes mellitus with hyperglycemia ([place]) 02/11/2018   Unspecified asthma, uncomplicated 01/60/1093   Vaginal atrophy 03/20/2021   Low back pain 12/06/2019   Hypoglycemia 12/14/2021   Acute lower UTI 12/14/2021   Other intra-abdominal and pelvic swelling, mass and lump 12/14/2021   Hematuria 12/14/2021   Hypothyroidism 12/14/2021   Anasarca 12/14/2021   Resolved Ambulatory Problems    Diagnosis Date Noted   ABNORMAL ELECTROCARDIOGRAM 11/19/2009   Dyspnea on exertion    Diabetes mellitus    Community acquired pneumonia 05/31/2015   CAP (community acquired pneumonia) 05/31/2015   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   Acute on chronic systolic CHF (congestive heart failure) ([place])  06/03/2018   CHF exacerbation ([place]) 06/04/2018   Acute on chronic heart failure with preserved ejection fraction (HFpEF) ([place]) 01/03/2019   Past Medical History:  Diagnosis Date   Allergy    Arthritis    Asthma    Cataract    Chronic bronchitis (HCC)    Chronic heart failure with preserved ejection fraction (HFpEF) (HCC)    Chronic kidney disease (CKD), stage IV (severe) (HCC)    Clotting disorder (HCC)    Cor pulmonale (chronic) (HCC)    Depression    GERD (gastroesophageal reflux disease)    History of hiatal hernia    HTN (hypertension)    Morbid obesity ([place])  Neuromuscular disorder (HCC)    Neuropathic pain    Permanent atrial fibrillation (HCC)    Sleep apnea    Thyroid disease    Type II diabetes mellitus (HCC)    Vitamin D deficiency    SOCIAL HX:  Social History   Tobacco Use   Smoking status: Never   Smokeless tobacco: Never  Substance Use Topics   Alcohol use: No   FAMILY HX:  Family History  Problem Relation Age of Onset   Stroke Father    Hypertension Father    Atrial fibrillation Father        HAD MURMUR   Heart failure Mother    Congestive Heart Failure Mother    Hypertension Mother    Hypertension Brother    Hypertension Sister    Hypertension Son    CAD Sister        EARLY   CAD Brother        EARLY   Colon cancer Neg Hx    Esophageal cancer Neg Hx    Stomach cancer Neg Hx    Rectal cancer Neg Hx        Preferred Pharmacy: ALLERGIES:  Allergies  Allergen Reactions   Epipen [Epinephrine] Other (See Comments)    Tachycardia    Ventolin [Albuterol] Other (See Comments)    Tachycardia A-Fib   Byetta 10 Mcg Pen [Exenatide] Other (See Comments)    Unknown reaction   Celexa [Citalopram Hydrobromide] Other (See Comments)    Prolonged QT prolongation   Demadex [Torsemide] Swelling   Reclast [Zoledronic Acid] Other (See Comments)    Pt was hospitalized due to medication   Tape Other (See Comments)    Skin tears easily   Toradol  [Ketorolac Tromethamine] Hives and Swelling    Swelling in hands   Zanaflex [Tizanidine] Other (See Comments)    Lethargy    Bee Venom Other (See Comments)    "makes me nervous"   Iodinated Contrast Media Other (See Comments)    Flushing     Keflex [Cephalexin] Other (See Comments)    Makes her feel warm; she was told never to take it.     UPDATE 11/05/21: Tolerated Cefazolin 10/29/21 to 11/05/21, switched to oral cefadroxil 11/05/21.      PERTINENT MEDICATIONS:  Outpatient Encounter Medications as of 12/29/2021  Medication Sig   acetaminophen (TYLENOL) 500 MG tablet Take 500 mg by mouth every 6 (six) hours as needed for moderate pain or headache.   allopurinol (ZYLOPRIM) 300 MG tablet Take 300 mg by mouth daily.   ALPRAZolam (XANAX) 0.5 MG tablet Take 0.5 mg by mouth every 6 (six) hours as needed for anxiety.   apixaban (ELIQUIS) 5 MG TABS tablet Take 1 tablet (5 mg total) by mouth 2 (two) times daily.   bumetanide (BUMEX) 2 MG tablet Take 2 tablets (4 mg total) by mouth daily.   docusate sodium (COLACE) 100 MG capsule Take 100 mg by mouth daily as needed for mild constipation.   DULoxetine (CYMBALTA) 30 MG capsule Take 30 mg by mouth in the morning.   estradiol (ESTRACE) 0.1 MG/GM vaginal cream Place 1 Applicatorful vaginally as needed (vaginal irritation).   insulin aspart (NOVOLOG FLEXPEN) 100 UNIT/ML FlexPen Inject 40-60 Units into the skin See admin instructions. Inject 40-60 units 3 times daily with meals per sliding scale: CBG < 150 : 40 units CBG 151-199 : 45 units CBG 200-249 : 50 units CBG 250-299 : 55 units CBG 300-349 : 60 units  insulin degludec (TRESIBA FLEXTOUCH) 200 UNIT/ML FlexTouch Pen Inject 26 Units into the skin daily. (Patient taking differently: Inject 25 Units into the skin daily before breakfast.)   levothyroxine (SYNTHROID) 88 MCG tablet Take 88 mcg by mouth every morning.   lidocaine-prilocaine (EMLA) cream 1 gram qid prn (Patient taking differently: Apply 1  Application topically daily as needed (severe neuropathy pain).)   metoprolol tartrate (LOPRESSOR) 25 MG tablet Take 0.5 tablets (12.5 mg total) by mouth 2 (two) times daily.   nystatin (MYCOSTATIN/NYSTOP) powder Apply 1 Application topically 2 (two) times daily as needed (yeast infection).   ondansetron (ZOFRAN) 4 MG tablet Take 1 tablet (4 mg total) by mouth every 8 (eight) hours as needed for nausea or vomiting.   Polyethyl Glycol-Propyl Glycol (SYSTANE OP) Place 1 drop into both eyes 2 (two) times daily as needed (dry eyes).   polyethylene glycol (MIRALAX / GLYCOLAX) 17 g packet Take 17 g by mouth daily as needed for mild constipation.   potassium chloride SA (KLOR-CON M) 20 MEQ tablet Take 2 tablets (40 mEq total) by mouth once a week. Take 2 tablets once weekly on Thursdays (Patient taking differently: Take 40 mEq by mouth See admin instructions. 20 meq on Tuesday, Thursday)   pregabalin (LYRICA) 50 MG capsule Take 50 mg by mouth 2 (two) times daily.   REGRANEX 0.01 % gel Apply 1 Application topically daily as needed for wound care. Apply to left foot   traMADol (ULTRAM) 50 MG tablet Take 50 mg by mouth 3 (three) times daily as needed (severe neuropathy pain).   Vitamin D, Ergocalciferol, (DRISDOL) 50000 UNITS CAPS Take 50,000 Units by mouth every Monday.   No facility-administered encounter medications on file as of 12/29/2021.     -------------------------------------------------------------------- Advance Care Planning/Goals of Care: Goals include to maximize quality of life and symptom management. Patient and health care surrogate gave their permission to discuss.Our advance care planning conversation included a discussion about:    Exploration of goals of care in the event of a sudden injury or illness -wants DNR/DNI Identification  of a healthcare agent-spouse Kristine [name] Review of MOST form and creation of an advance directive document-DNR . Decision not to resuscitate or to de-escalate  disease focused treatments due to poor prognosis. CODE STATUS: DNR/DNI    Thank you for the opportunity to participate in the care of Kristine [name].  The palliative care team will continue to follow. Please call our office at [phone] if we can be of additional assistance.   Marijo Conception, FNP-C  COVID-19 PATIENT SCREENING TOOL Asked and negative response unless otherwise noted:  Have you had symptoms of covid, tested positive or been in contact with someone with symptoms/positive test in the past 5-10 days?

## 2021-12-30 ENCOUNTER — Telehealth (HOSPITAL_COMMUNITY): Payer: Self-pay

## 2021-12-30 DIAGNOSIS — I2729 Other secondary pulmonary hypertension: Secondary | ICD-10-CM | POA: Insufficient documentation

## 2021-12-30 DIAGNOSIS — Z515 Encounter for palliative care: Secondary | ICD-10-CM | POA: Insufficient documentation

## 2021-12-30 DIAGNOSIS — I50810 Right heart failure, unspecified: Secondary | ICD-10-CM | POA: Insufficient documentation

## 2021-12-30 NOTE — Telephone Encounter (Signed)
Called to confirm/remind patient of their (video) appointment at the [facility] on 12/31/21.   Patient was also reminded someone from our office will call her 15 minutes prior to her appointment time to go over her meds and etc.

## 2021-12-31 ENCOUNTER — Ambulatory Visit (HOSPITAL_COMMUNITY): Admission: RE | Admit: 2021-12-31 | Payer: Medicare HMO | Source: Ambulatory Visit

## 2021-12-31 ENCOUNTER — Encounter (HOSPITAL_COMMUNITY): Payer: Self-pay

## 2022-01-05 ENCOUNTER — Ambulatory Visit: Payer: Medicare HMO | Admitting: Orthopedic Surgery

## 2022-01-09 ENCOUNTER — Other Ambulatory Visit (HOSPITAL_COMMUNITY): Payer: Self-pay

## 2022-02-17 ENCOUNTER — Other Ambulatory Visit: Payer: Medicare HMO

## 2022-02-20 DEATH — deceased

## 2022-03-03 ENCOUNTER — Encounter (HOSPITAL_COMMUNITY): Payer: Medicare HMO | Admitting: Internal Medicine

## 2022-03-03 ENCOUNTER — Telehealth (HOSPITAL_COMMUNITY): Payer: Self-pay | Admitting: Cardiology

## 2022-03-03 NOTE — Telephone Encounter (Signed)
Per pts daughter Deceased on 11/3 Chart updated

## 2022-04-20 ENCOUNTER — Ambulatory Visit: Payer: Medicare HMO | Admitting: Hematology

## 2022-04-20 ENCOUNTER — Other Ambulatory Visit: Payer: Medicare HMO

## 2023-06-01 ENCOUNTER — Other Ambulatory Visit: Payer: Self-pay

## 2023-07-06 ENCOUNTER — Other Ambulatory Visit: Payer: Self-pay

## 2023-08-17 ENCOUNTER — Other Ambulatory Visit: Payer: Self-pay

## 2023-10-28 ENCOUNTER — Other Ambulatory Visit: Payer: Self-pay
